# Patient Record
Sex: Male | Born: 1950
Health system: Southern US, Community
[De-identification: ages and names within clinical notes are randomized; demographics above are authoritative.]

## PROBLEM LIST (undated history)

## (undated) DIAGNOSIS — H47019 Ischemic optic neuropathy, unspecified eye: Secondary | ICD-10-CM

## (undated) DIAGNOSIS — I422 Other hypertrophic cardiomyopathy: Secondary | ICD-10-CM

## (undated) DIAGNOSIS — I472 Ventricular tachycardia: Secondary | ICD-10-CM

## (undated) DIAGNOSIS — F32A Depression, unspecified: Secondary | ICD-10-CM

## (undated) DIAGNOSIS — G250 Essential tremor: Secondary | ICD-10-CM

## (undated) DIAGNOSIS — Z9852 Vasectomy status: Secondary | ICD-10-CM

## (undated) DIAGNOSIS — I471 Supraventricular tachycardia, unspecified: Secondary | ICD-10-CM

## (undated) DIAGNOSIS — K219 Gastro-esophageal reflux disease without esophagitis: Secondary | ICD-10-CM

## (undated) DIAGNOSIS — H8110 Benign paroxysmal vertigo, unspecified ear: Secondary | ICD-10-CM

## (undated) DIAGNOSIS — E669 Obesity, unspecified: Secondary | ICD-10-CM

## (undated) DIAGNOSIS — Z9049 Acquired absence of other specified parts of digestive tract: Secondary | ICD-10-CM

## (undated) DIAGNOSIS — I639 Cerebral infarction, unspecified: Secondary | ICD-10-CM

## (undated) DIAGNOSIS — Z9089 Acquired absence of other organs: Secondary | ICD-10-CM

## (undated) DIAGNOSIS — I4729 Other ventricular tachycardia: Secondary | ICD-10-CM

## (undated) DIAGNOSIS — M1990 Unspecified osteoarthritis, unspecified site: Secondary | ICD-10-CM

## (undated) DIAGNOSIS — I10 Essential (primary) hypertension: Secondary | ICD-10-CM

## (undated) DIAGNOSIS — M109 Gout, unspecified: Secondary | ICD-10-CM

## (undated) DIAGNOSIS — C439 Malignant melanoma of skin, unspecified: Secondary | ICD-10-CM

## (undated) DIAGNOSIS — Z974 Presence of external hearing-aid: Secondary | ICD-10-CM

## (undated) DIAGNOSIS — F329 Major depressive disorder, single episode, unspecified: Secondary | ICD-10-CM

## (undated) DIAGNOSIS — Z973 Presence of spectacles and contact lenses: Secondary | ICD-10-CM

## (undated) DIAGNOSIS — Z9989 Dependence on other enabling machines and devices: Secondary | ICD-10-CM

## (undated) DIAGNOSIS — E785 Hyperlipidemia, unspecified: Secondary | ICD-10-CM

## (undated) DIAGNOSIS — I493 Ventricular premature depolarization: Secondary | ICD-10-CM

## (undated) DIAGNOSIS — G4733 Obstructive sleep apnea (adult) (pediatric): Secondary | ICD-10-CM

## (undated) HISTORY — DX: Benign paroxysmal vertigo, unspecified ear: H81.10

## (undated) HISTORY — DX: Essential tremor: G25.0

## (undated) HISTORY — DX: Ischemic optic neuropathy, unspecified eye: H47.019

## (undated) HISTORY — DX: Obesity, unspecified: E66.9

## (undated) HISTORY — DX: Unspecified osteoarthritis, unspecified site: M19.90

## (undated) HISTORY — DX: Gout, unspecified: M10.9

## (undated) HISTORY — DX: Obstructive sleep apnea (adult) (pediatric): G47.33

## (undated) HISTORY — DX: Gastro-esophageal reflux disease without esophagitis: K21.9

## (undated) HISTORY — PX: WISDOM TOOTH EXTRACTION: SHX21

## (undated) HISTORY — PX: OTHER SURGICAL HISTORY: SHX169

## (undated) HISTORY — DX: Dependence on other enabling machines and devices: Z99.89

## (undated) HISTORY — PX: NASAL SEPTUM SURGERY: SHX37

## (undated) HISTORY — PX: CATARACT EXTRACTION: SUR2

## (undated) HISTORY — DX: Hyperlipidemia, unspecified: E78.5

## (undated) HISTORY — PX: COLONOSCOPY: SHX174

## (undated) HISTORY — PX: APPENDECTOMY: SHX54

## (undated) HISTORY — PX: TONSILLECTOMY: SUR1361

---
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
## 1998-12-14 ENCOUNTER — Ambulatory Visit (HOSPITAL_BASED_OUTPATIENT_CLINIC_OR_DEPARTMENT_OTHER): Admission: RE | Admit: 1998-12-14 | Discharge: 1998-12-14 | Payer: Self-pay | Admitting: General Surgery

## 1999-03-19 ENCOUNTER — Encounter: Payer: Self-pay | Admitting: Emergency Medicine

## 1999-03-19 ENCOUNTER — Emergency Department (HOSPITAL_COMMUNITY): Admission: EM | Admit: 1999-03-19 | Discharge: 1999-03-19 | Payer: Self-pay | Admitting: Emergency Medicine

## 1999-12-23 ENCOUNTER — Encounter: Admission: RE | Admit: 1999-12-23 | Discharge: 2000-03-22 | Payer: Self-pay | Admitting: Internal Medicine

## 2000-06-26 ENCOUNTER — Encounter: Payer: Self-pay | Admitting: Emergency Medicine

## 2000-06-26 ENCOUNTER — Observation Stay (HOSPITAL_COMMUNITY): Admission: EM | Admit: 2000-06-26 | Discharge: 2000-06-27 | Payer: Self-pay | Admitting: Emergency Medicine

## 2000-09-01 ENCOUNTER — Encounter: Admission: RE | Admit: 2000-09-01 | Discharge: 2000-11-30 | Payer: Self-pay | Admitting: Internal Medicine

## 2006-08-28 ENCOUNTER — Emergency Department (HOSPITAL_COMMUNITY): Admission: EM | Admit: 2006-08-28 | Discharge: 2006-08-28 | Payer: Self-pay | Admitting: Emergency Medicine

## 2006-12-18 ENCOUNTER — Encounter: Admission: RE | Admit: 2006-12-18 | Discharge: 2006-12-18 | Payer: Self-pay | Admitting: Orthopedic Surgery

## 2008-08-07 ENCOUNTER — Ambulatory Visit: Payer: Self-pay | Admitting: Internal Medicine

## 2008-08-07 DIAGNOSIS — E1169 Type 2 diabetes mellitus with other specified complication: Secondary | ICD-10-CM | POA: Insufficient documentation

## 2008-08-07 DIAGNOSIS — K219 Gastro-esophageal reflux disease without esophagitis: Secondary | ICD-10-CM | POA: Insufficient documentation

## 2008-08-07 DIAGNOSIS — E669 Obesity, unspecified: Secondary | ICD-10-CM | POA: Insufficient documentation

## 2008-09-06 ENCOUNTER — Encounter: Payer: Self-pay | Admitting: Internal Medicine

## 2008-09-06 ENCOUNTER — Ambulatory Visit: Payer: Self-pay | Admitting: Internal Medicine

## 2008-09-06 LAB — CONVERTED CEMR LAB: UREASE: NEGATIVE

## 2008-09-07 ENCOUNTER — Encounter: Payer: Self-pay | Admitting: Internal Medicine

## 2008-09-13 ENCOUNTER — Encounter: Admission: RE | Admit: 2008-09-13 | Discharge: 2008-09-13 | Payer: Self-pay | Admitting: Occupational Medicine

## 2009-09-20 DIAGNOSIS — M1990 Unspecified osteoarthritis, unspecified site: Secondary | ICD-10-CM | POA: Insufficient documentation

## 2009-09-20 DIAGNOSIS — I493 Ventricular premature depolarization: Secondary | ICD-10-CM | POA: Insufficient documentation

## 2009-09-20 DIAGNOSIS — M109 Gout, unspecified: Secondary | ICD-10-CM | POA: Insufficient documentation

## 2009-09-20 DIAGNOSIS — R809 Proteinuria, unspecified: Secondary | ICD-10-CM | POA: Insufficient documentation

## 2009-11-29 DIAGNOSIS — E1121 Type 2 diabetes mellitus with diabetic nephropathy: Secondary | ICD-10-CM | POA: Insufficient documentation

## 2010-03-07 ENCOUNTER — Emergency Department (HOSPITAL_COMMUNITY): Admission: EM | Admit: 2010-03-07 | Discharge: 2010-03-07 | Payer: Self-pay | Admitting: Emergency Medicine

## 2010-12-19 ENCOUNTER — Ambulatory Visit (HOSPITAL_BASED_OUTPATIENT_CLINIC_OR_DEPARTMENT_OTHER)
Admission: RE | Admit: 2010-12-19 | Discharge: 2010-12-19 | Payer: Self-pay | Source: Home / Self Care | Attending: Otolaryngology | Admitting: Otolaryngology

## 2010-12-28 DIAGNOSIS — G4733 Obstructive sleep apnea (adult) (pediatric): Secondary | ICD-10-CM

## 2011-01-23 DIAGNOSIS — J309 Allergic rhinitis, unspecified: Secondary | ICD-10-CM | POA: Insufficient documentation

## 2011-01-30 ENCOUNTER — Encounter: Payer: Self-pay | Admitting: Internal Medicine

## 2011-02-13 ENCOUNTER — Other Ambulatory Visit: Payer: Self-pay | Admitting: Orthopedic Surgery

## 2011-02-13 DIAGNOSIS — R609 Edema, unspecified: Secondary | ICD-10-CM

## 2011-02-13 DIAGNOSIS — M25561 Pain in right knee: Secondary | ICD-10-CM

## 2011-02-19 ENCOUNTER — Ambulatory Visit
Admission: RE | Admit: 2011-02-19 | Discharge: 2011-02-19 | Disposition: A | Discharge status: Home / Self Care | Payer: BC Managed Care – PPO | Source: Ambulatory Visit | Attending: Orthopedic Surgery | Admitting: Orthopedic Surgery

## 2011-02-19 DIAGNOSIS — M25561 Pain in right knee: Secondary | ICD-10-CM

## 2011-02-19 DIAGNOSIS — R609 Edema, unspecified: Secondary | ICD-10-CM

## 2011-04-08 ENCOUNTER — Other Ambulatory Visit (HOSPITAL_COMMUNITY): Payer: BC Managed Care – PPO

## 2011-04-08 ENCOUNTER — Encounter (HOSPITAL_COMMUNITY): Payer: Self-pay | Admitting: Radiology

## 2011-04-08 ENCOUNTER — Inpatient Hospital Stay (HOSPITAL_COMMUNITY)
Admission: EM | Admit: 2011-04-08 | Discharge: 2011-04-11 | DRG: 014 | Disposition: A | Discharge status: Home / Self Care | Payer: BC Managed Care – PPO | Attending: Internal Medicine | Admitting: Internal Medicine

## 2011-04-08 ENCOUNTER — Emergency Department (HOSPITAL_COMMUNITY): Payer: BC Managed Care – PPO

## 2011-04-08 DIAGNOSIS — E119 Type 2 diabetes mellitus without complications: Secondary | ICD-10-CM | POA: Diagnosis present

## 2011-04-08 DIAGNOSIS — G4733 Obstructive sleep apnea (adult) (pediatric): Secondary | ICD-10-CM | POA: Diagnosis present

## 2011-04-08 DIAGNOSIS — R42 Dizziness and giddiness: Secondary | ICD-10-CM

## 2011-04-08 DIAGNOSIS — Z794 Long term (current) use of insulin: Secondary | ICD-10-CM

## 2011-04-08 DIAGNOSIS — I10 Essential (primary) hypertension: Secondary | ICD-10-CM | POA: Diagnosis present

## 2011-04-08 DIAGNOSIS — Z7902 Long term (current) use of antithrombotics/antiplatelets: Secondary | ICD-10-CM

## 2011-04-08 DIAGNOSIS — M1990 Unspecified osteoarthritis, unspecified site: Secondary | ICD-10-CM | POA: Diagnosis present

## 2011-04-08 DIAGNOSIS — K449 Diaphragmatic hernia without obstruction or gangrene: Secondary | ICD-10-CM | POA: Diagnosis present

## 2011-04-08 DIAGNOSIS — I6350 Cerebral infarction due to unspecified occlusion or stenosis of unspecified cerebral artery: Principal | ICD-10-CM | POA: Diagnosis present

## 2011-04-08 DIAGNOSIS — M109 Gout, unspecified: Secondary | ICD-10-CM | POA: Diagnosis present

## 2011-04-08 DIAGNOSIS — R809 Proteinuria, unspecified: Secondary | ICD-10-CM | POA: Diagnosis present

## 2011-04-08 DIAGNOSIS — E785 Hyperlipidemia, unspecified: Secondary | ICD-10-CM | POA: Diagnosis present

## 2011-04-08 DIAGNOSIS — J309 Allergic rhinitis, unspecified: Secondary | ICD-10-CM | POA: Diagnosis present

## 2011-04-08 DIAGNOSIS — K219 Gastro-esophageal reflux disease without esophagitis: Secondary | ICD-10-CM | POA: Diagnosis present

## 2011-04-08 HISTORY — DX: Essential (primary) hypertension: I10

## 2011-04-08 HISTORY — DX: Acquired absence of other organs: Z90.89

## 2011-04-08 HISTORY — DX: Acquired absence of other specified parts of digestive tract: Z90.49

## 2011-04-08 HISTORY — DX: Cerebral infarction, unspecified: I63.9

## 2011-04-08 HISTORY — DX: Malignant melanoma of skin, unspecified: C43.9

## 2011-04-08 HISTORY — DX: Vasectomy status: Z98.52

## 2011-04-08 LAB — DIFFERENTIAL
Eosinophils Relative: 4 % (ref 0–5)
Lymphocytes Relative: 28 % (ref 12–46)
Lymphs Abs: 2 10*3/uL (ref 0.7–4.0)
Monocytes Absolute: 0.6 10*3/uL (ref 0.1–1.0)

## 2011-04-08 LAB — URINALYSIS, ROUTINE W REFLEX MICROSCOPIC
Glucose, UA: 250 mg/dL — AB
Hgb urine dipstick: NEGATIVE
Specific Gravity, Urine: 1.013 (ref 1.005–1.030)
Urobilinogen, UA: 0.2 mg/dL (ref 0.0–1.0)

## 2011-04-08 LAB — CBC
HCT: 38.3 % — ABNORMAL LOW (ref 39.0–52.0)
MCHC: 34.7 g/dL (ref 30.0–36.0)
MCV: 85.7 fL (ref 78.0–100.0)
RDW: 13 % (ref 11.5–15.5)

## 2011-04-08 LAB — URINE MICROSCOPIC-ADD ON

## 2011-04-08 LAB — POCT I-STAT, CHEM 8
Chloride: 104 mEq/L (ref 96–112)
Creatinine, Ser: 1 mg/dL (ref 0.4–1.5)
Glucose, Bld: 203 mg/dL — ABNORMAL HIGH (ref 70–99)
Hemoglobin: 13.3 g/dL (ref 13.0–17.0)
Potassium: 3.7 mEq/L (ref 3.5–5.1)

## 2011-04-08 LAB — GLUCOSE, CAPILLARY: Glucose-Capillary: 147 mg/dL — ABNORMAL HIGH (ref 70–99)

## 2011-04-08 MED ORDER — GADOBENATE DIMEGLUMINE 529 MG/ML IV SOLN: 20.0000 mL | Freq: Once | INTRAVENOUS | Status: DC

## 2011-04-09 ENCOUNTER — Encounter (HOSPITAL_COMMUNITY): Payer: Self-pay | Admitting: Radiology

## 2011-04-09 ENCOUNTER — Inpatient Hospital Stay (HOSPITAL_COMMUNITY): Payer: BC Managed Care – PPO

## 2011-04-09 LAB — COMPREHENSIVE METABOLIC PANEL
ALT: 18 U/L (ref 0–53)
AST: 17 U/L (ref 0–37)
Albumin: 3.2 g/dL — ABNORMAL LOW (ref 3.5–5.2)
Alkaline Phosphatase: 103 U/L (ref 39–117)
BUN: 11 mg/dL (ref 6–23)
Chloride: 100 mEq/L (ref 96–112)
Potassium: 3.8 mEq/L (ref 3.5–5.1)
Sodium: 137 mEq/L (ref 135–145)
Total Bilirubin: 0.3 mg/dL (ref 0.3–1.2)

## 2011-04-09 LAB — CBC
MCV: 85.7 fL (ref 78.0–100.0)
Platelets: 213 10*3/uL (ref 150–400)
RBC: 4.74 MIL/uL (ref 4.22–5.81)
WBC: 8.8 10*3/uL (ref 4.0–10.5)

## 2011-04-09 LAB — GLUCOSE, CAPILLARY
Glucose-Capillary: 106 mg/dL — ABNORMAL HIGH (ref 70–99)
Glucose-Capillary: 109 mg/dL — ABNORMAL HIGH (ref 70–99)
Glucose-Capillary: 166 mg/dL — ABNORMAL HIGH (ref 70–99)

## 2011-04-09 LAB — LIPID PANEL
Cholesterol: 176 mg/dL (ref 0–200)
VLDL: 75 mg/dL — ABNORMAL HIGH (ref 0–40)

## 2011-04-09 LAB — TSH: TSH: 3.576 u[IU]/mL (ref 0.350–4.500)

## 2011-04-09 LAB — URIC ACID: Uric Acid, Serum: 6.9 mg/dL (ref 4.0–7.8)

## 2011-04-09 MED ORDER — IOHEXOL 350 MG/ML SOLN
75.0000 mL | Freq: Once | INTRAVENOUS | Status: AC | PRN
  Administered 2011-04-09: 75 mL via INTRAVENOUS

## 2011-04-10 LAB — GLUCOSE, CAPILLARY
Glucose-Capillary: 171 mg/dL — ABNORMAL HIGH (ref 70–99)
Glucose-Capillary: 212 mg/dL — ABNORMAL HIGH (ref 70–99)
Glucose-Capillary: 194 mg/dL — ABNORMAL HIGH (ref 70–99)
Glucose-Capillary: 161 mg/dL — ABNORMAL HIGH (ref 70–99)

## 2011-04-11 LAB — GLUCOSE, CAPILLARY: Glucose-Capillary: 93 mg/dL (ref 70–99)

## 2011-04-11 NOTE — H&P (Signed)
[place]. [facility]  Patient:    Joshua [name], Joshua [name]                      MRN: [id number] Adm. Date:  [id number] Attending:  Hoyle Sauer CC:         Jonelle Sports. Cheryll Cockayne, M.D.   History and Physical  CHIEF COMPLAINT:  Inhaled chlorine gas resulting in shortness of breath.  HISTORY OF PRESENT ILLNESS:  This is a 60 year old Caucasian male followed by Dr. 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  This resulted in significant respiratory distress and a dry cough along with mild pleuritic chest pain.  The patient denied any nausea, vomiting, blurred vision, headache, rhinitis, diarrhea, constipation, or new neurological deficits.  The patient presented to [facility] Emergency Room where his pO2 was 69 on room air and he is now admitted for observation secondary to this toxicity exposure.  REVIEW OF SYSTEMS:  As above.  PAST MEDICAL HISTORY:  Type 2 diabetes mellitus with the last hemoglobin A1C being approximately 6.9% per the patient.  Gout.  Osteoarthritis.  SOCIAL HISTORY:  The patient is married, has two kids.  He is a high Engineer, site locally and teaches history.  He is active and swims laps in a pool and walks on a regular basis.  Denies any tobacco abuse and has occasional ethanol use.  FAMILY HISTORY:  Negative for pulmonary disease, cancer or early heart disease.  MEDICATIONS:  Include Glucophage twice daily, Naprosyn and Allopurinol.  PHYSICAL EXAMINATION:  GENERAL:  Morbidly obese but pleasant, cooperative and communicative white male in no apparent distress with oxygen.  VITAL SIGNS:  Blood pressure is 136/87, pulse 87, respiratory rate 24, temperature 99.3 degrees Fahrenheit. Oxygen saturation is 93%  on room air.  HEENT:  Head exam is normocephalic and atraumatic.  Eye exam:  Anicteric. Extraocular movements are intact.  ENT exam:  There is no sinus tenderness. No oropharyngeal lesions.  SKIN: Warm and dry.  NECK:  Supple.  No thyromegaly.  No cervical lymphadenopathy.  LUNGS:  Clear to auscultation bilaterally.  CARDIOVASCULAR:  Regular rate and rhythm without murmurs, rubs, or gallops.  ABDOMEN:  Reveals soft but obese, nontender, nondistended abdomen with bowel sounds present throughout.  EXTREMITIES:  Reveals no peripheral edema with pulses intact in all four extremities.  There is no cyanosis.  NEUROLOGIC:  Grossly nonfocal.  LABORATORY DATA:  Reveals an ABG with pH of 7.405, pCO2 45, pO2 67, 93% oxygen saturation on room air.  Chest x-ray reveals no apparent distress with no cardiomegaly or pulmonary edema.  ASSESSMENT AND PLAN:  We have a 60 year old, Caucasian male with type 2 diabetes mellitus and gout and morbid obesity who presents with toxicity secondary to inhaled chlorine gas.  1. Chlorine gas toxicity.  Will observe the patient overnight for progression    of his symptoms and support with oxygen and measure his oxygen saturation    on a regular basis.  Will obtain a normal ABG to confirm improvement. 2. Chest pain.  Will check an EKG but I highly suspect the chest discomfort    is secondary to chlorine gas exposure which is somewhat pleuritic in    nature.  The patient is normally active, swimming laps just  yesterday.    The patient denies any nausea or vomiting, diaphoresis or shortness of    breath at the current time. 3. Type 2 diabetes mellitus.  Will continue Glucophage; however, given the    fact that we do not know his current dosage, will start Glucophage 500 mg    twice daily and continue a no concentrated sweet diet and watch his CBGs    q.a.c. and h.s. 4. Gout. Asymptomatic currently with benign extremity exam.  Will continue    Allopurinol at  100 mg each day. DD:  06/26/00 TD:  06/27/00 Job: [id number]

## 2011-04-17 DIAGNOSIS — R42 Dizziness and giddiness: Secondary | ICD-10-CM | POA: Insufficient documentation

## 2011-04-17 NOTE — Consult Note (Signed)
NAMEMarland Kitchen  Joshua [name], Joshua [name] NO.:  [id number]  MEDICAL RECORD NO.:  [id number]           PATIENT TYPE:  I  LOCATION:  3029                         FACILITY:  [facility]  PHYSICIAN:  Thana Farr, MD    DATE OF BIRTH:  30-Nov-1950  DATE OF CONSULTATION:  04/08/2011 DATE OF DISCHARGE:                                CONSULTATION   REQUESTING PHYSICIAN:  Dr. Patrica Duel.  HISTORY:  Joshua [name] is a 60 year old gentleman that reports that he awakened this morning at approximately 6 a.m.  After going to the bathroom had acute onset of dizziness.  He describes the dizziness as a vertigo.  Reports that he was able to return to his bedside and when he attempted to stand had acute weakness and fell to the floor.  Was unable to stand back up.  The patient called his sons at that time and EMS was called.  The patient did have some nausea associated with his vertigo and had one episode of vomiting.  The patient describes that with looking left to right up or down he has no symptoms.  With just moving his head, the patient has no symptoms either.  Seems to only have symptoms if he is moving his whole body.  PAST MEDICAL HISTORY: 1. Hypertension. 2. Diabetes. 3. 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 4. Melanoma.  MEDICATIONS AT HOME:  Aspirin, Celebrex, Diovan, fenofibrate, Glucovance, Lovaza, Lutein, multivitamin, Percocet, trazodone, vitamin B12, and Vytorin.  SOCIAL HISTORY:  The patient has no history of illicit drug abuse or smoking.  He does drink on occasion.  PHYSICAL EXAMINATION:  VITAL SIGNS:  Blood pressure 153/82, heart rate 78, respiratory rate 20, temperature 97.6. MENTAL STATUS TESTING:  The patient is alert and oriented.  He can follow commands without difficulty.  Speech is fluent.  On cranial nerve testing II disk flat bilaterally.  Visual fields grossly intact.  III, IV and VI extraocular movements intact.  V and VII smile symmetric. VIII grossly intact.   IX and VII positive gag.  XI bilateral shoulder shrug.  XII midline tongue extension.  On motor exam, the patient is 5/5 throughout.  There is normal tone and bulk.  Sensory, pinprick and light touch are intact bilaterally.  Deep tendon reflexes are 2+ in the left upper extremity, 1+ in the right upper extremity, and absent in the lower extremities.  Plantars are upgoing bilaterally.  On cerebellar testing, finger-to-nose and heel-to-shin intact.  LABORATORY DATA:  White blood cell count 7.1, platelet count 193, hemoglobin/hematocrit 13.3 and 38.3 respectively.  Glucose 203, sodium 140, potassium 3.7, chloride 104, bicarb 25, BUN and creatinine 13 and 1.0 respectively.  Troponin less than 0.05.  CT shows old basal ganglia infarcts bilaterally and an area of low density in the right temporal lobe.  Etiology is questionable and does include artifact.  ASSESSMENT:  Joshua [name] is a 60 year old male with acute onset of vertigo.  Remainder of neurological exam is unremarkable.  The patient has no nystagmus.  There is a questionable abnormality on CT.PLAN: 1. MRI of the brain with and without contrast.  If no acute abnormalities  are noted, would not proceed with stroke workup. Vertigo at that point may be related to diabetes versus an ENT etiology. Otherwise, stroke workup to be initiated.  We will follow up after MRI performed.          ______________________________ Thana Farr, MD     LR/MEDQ  D:  04/08/2011  T:  04/08/2011  Job:  [id number]  Electronically Signed by Thana Farr MD on 04/17/2011 05:10:47 PM

## 2011-04-22 ENCOUNTER — Ambulatory Visit: Payer: BC Managed Care – PPO | Attending: Internal Medicine | Admitting: Physical Therapy

## 2011-04-22 DIAGNOSIS — IMO0001 Reserved for inherently not codable concepts without codable children: Secondary | ICD-10-CM | POA: Insufficient documentation

## 2011-04-22 DIAGNOSIS — I69998 Other sequelae following unspecified cerebrovascular disease: Secondary | ICD-10-CM | POA: Insufficient documentation

## 2011-04-22 DIAGNOSIS — R269 Unspecified abnormalities of gait and mobility: Secondary | ICD-10-CM | POA: Insufficient documentation

## 2011-04-22 DIAGNOSIS — R42 Dizziness and giddiness: Secondary | ICD-10-CM | POA: Insufficient documentation

## 2011-04-23 NOTE — Discharge Summary (Signed)
NAMEMarland Kitchen  Joshua [name], Joshua [name] NO.:  [id number]  MEDICAL RECORD NO.:  [id number]           PATIENT TYPE:  I  LOCATION:  3029                         FACILITY:  [facility]  PHYSICIAN:  Gwen Pounds, MD       DATE OF BIRTH:  14-May-1951  DATE OF ADMISSION:  04/08/2011 DATE OF DISCHARGE:  04/11/2011                              DISCHARGE SUMMARY   PRIMARY CARE PROVIDER:  Myself.  NEUROLOGIST:  Pramod P. Pearlean Brownie, MD  CARDIOLOGIST:  Lyn Records, MD  OPHTHALMOLOGIST:  Delon Sacramento, MD  DISCHARGE DIAGNOSES: 1. Resolving intractable vertigo. 2. Silent acute small parietal cerebrovascular accident. 3. Hyperlipidemia. 4. Diabetes mellitus type 2. 5. Hypertension. 6. Gout. 7. History of cataracts. 8. History of elevated sed rate with negative rheumatologic workup. 9. History of spontaneous central retinal artery occlusion in April     2011. 10.Obstructive sleep apnea on CPAP. 11.History of negative stress test in 2010 with ejection fraction 50%. 12.Right knee arthroscopy April 2012, discharged on Percocet and     Celebrex which I discontinued the Celebrex. 13.Allergic rhinitis 14.Microalbuminuria. 15.Morbid obesity. 16.Osteoarthritis. 17.Pre premature ventricular contractions. 18.History of elevated creatinine up to 1.8 with the last creatinines     in the hospital at 0.92. 19.Gastroesophageal reflux disease, hiatal hernia. 20.History of precancerous skin lesion removal. 21.History of 5 knee operations. 22.History of appendectomy. 23.Tonsil and adenoidectomy. 24.History of sinus surgery.  DISCHARGE MEDICATION: 1. Allopurinol 300 mg p.o. at bedtime. 2. Plavix 75 daily. 3. Zetia 10 mg p.o. daily. 4. Meclizine 25 mg 3 times a day as needed for dizzy. 5. Crestor 20 mg p.o. daily. 6. Zetia 10 mg p.o. daily. 7. Crestor and Zetia is to replace the Vytorin. 8. Diovan HCT 160/12.5 daily. 9. Fenofibrate 54 mg p.o. daily. 10.Fish oil 1 gram 2 capsules by mouth  daily. 11.Glucovance 2.5/500, 2 tablets by mouth twice daily. 12.Lantus 60 units b.i.d. 13.Lutein 1 tablet every morning. 14.Multivitamin 2 p.o. daily. 15.Percocet 1-2 tablets by mouth 3 times a day as needed for pain. 16.Trazodone 100 mg p.o. at bedtime. 17.Vitamin B12 5000 mcg 1 tablet by mouth daily. 18.He is to stop taking aspirin and the Celebrex at this current time.  AFTERCARE FOLLOWUP INSTRUCTIONS:  He is to follow up with me in about 2 weeks.  He is to lose 20-30 pounds over the summer and he is to slowly resume baseline activities.  He is to do outpatient physical therapy and vestibular rehab.  DISCHARGE PROCEDURES: 1. A 2-D echocardiogram which is currently pending.  CT angio of the     neck and the head revealing no significant findings in the neck,     mild atherosclerotic changes of both carotid bifurcations but no     measurable stenosis compared to the Morris distal and cervical ICA     diameter and negative CT angiogram of the large or medium size     intracranial vessels.  No evidence of occlusion, stenosis,     aneurysm, or vascular malformation.  This is after a carotid     Doppler which revealed about 60% left ICA stenosis. 2. MRI  of the brain reveals small focus of acute infarct of the right     parasagittal front white matter corpus callosum at the right     parietal lobe.  Atrophy and small-vessel disease.  No hemorrhage or     midline shift.  Widely patent carotid and basilar arteries.  Small     radiopaque foreign bodies embedded in the patient's subcutaneous     soft tissues. 3. Cranial CT revealed possible edema anteriorly and medial in the     right temporal lobe versus artifact, inflammatory versus subacute     infarction, chronic lacunar infarcts.  HISTORY OF PRESENT ILLNESS:  Briefly, Joshua [name] is a 60 year old male well-known to me who woke up at 6:00 a.m. on the day of admission, was feeling fine, went to the bathroom, came back, lied down  on bed, immediately had significant dizzy, nausea, headache, fall, presyncope type event with diaphoresis.  It did not improve.  He called EMS, was brought to the emergency room where clinical eval was compatible with vertigo but fear of stroke was entertained and cranial CT was done which revealed edema and felt necessary to proceed to the MRI which revealed a subacute stroke.  HOSPITAL COURSE:  Joshua [name] is a 60 year old man with type 2 diabetes, morbid obesity, hypertension, hyperlipidemia, obstructive sleep apnea who presented with an acute cerebrovascular accident and vertigo type symptoms.  At first it was difficult to determine whether this stroke led to the vertiginous symptoms or whether he had 2 primary problems, i.e., vertigo with an incidental finding of a subacute stroke.  He was admitted, he was placed on telemetry.  EKG and telemetry ruled out arrhythmia.  He was continued on his home medications with some adjustments.  He was placed on insulin with insulin sliding scales.  A1c came back at 7.6.  He will need tighter control.  He will also need some weight loss.  This can be pursued further as an outpatient.  Neurology was consulted and helped.  He was switched from aspirin to Plavix.  He was kept on a CPAP.  Blood pressure medications were adjusted and we went too far and had to readjust back.  He was placed on Lovenox for DVT prophylaxis.  He was given Zofran, morphine, Ativan, and treatment for his underlying symptoms.  He was given some IV fluids and oxygen initially and these were discontinued eventually.  Again his stroke was considered silent and we will continue further risk factor modification.  His Vytorin was switched over to Crestor and Zetia as his triglycerides still remained at 377, although his LDL is fine and he will stay on the fenofibrate as well in order to avoid potential rhabo and drug interactions and will stay on the Crestor instead of  the Vytorin.  His vertigo is improving.  He is getting physical therapy and vestibular rehab and he has remained on meclizine and further adjustments could be made as an outpatient.  Diabetes mellitus.  His blood sugars have remained 90 to about 190 at the hospital stay.  Glucovance was held throughout the hospital stay and he did get some IV contrast and this was a good thing.  His creatinine has been fine, so he will be discharged back on his stable home regimen and hopefully will be able to wean down his medications as his A1c goes down and as his weight goes down.  His blood pressure on date of discharge is 113/68.  For his obstructive sleep apnea,  he will continue on his CPAP at night.  Joshua [name] was seen and evaluated on Apr 11, 2011.  He states he is feeling better.  He is little bit symptomatic but he is stable enough to go home.  All his vital signs are stable.  His blood sugars are fine. His physical exam is unremarkable except for the nystagmus.  His last labs showed A1c of 7.6, TSH of 3.56, uric acid 6.9, total cholesterol 176, triglyceride 377, HDL 33, LDL 68.  Echo is currently pending as stated above.  His last CMET shows a BUN of 11, creatinine 0.92.  Liver tests were fine except for albumin of 3.2.  CBC was completely normal. Plan for today is to go home, follow up with me, and get outpatient physical therapy and vestibular rehab and follow up with Dr. Pearlean Brownie as needed.     Gwen Pounds, MD     JMR/MEDQ  D:  04/11/2011  T:  04/11/2011  Job:  [id number]  cc:   Pramod P. Pearlean Brownie, MD Lyn Records, M.D. Delon Sacramento, M.D.  Electronically Signed by Creola Corn MD on 04/23/2011 10:26:51 PM

## 2011-04-23 NOTE — H&P (Signed)
NAME:  Joshua [name], Joshua [name] NO.:  [id number]  MEDICAL RECORD NO.:  [id number]           PATIENT TYPE:  I  LOCATION:  3029                         FACILITY:  [facility]  PHYSICIAN:  Gwen Pounds, MD       DATE OF BIRTH:  [birth year]  DATE OF ADMISSION:  04/08/2011 DATE OF DISCHARGE:                             HISTORY & PHYSICAL   PRIMARY CARE PROVIDER:  Gwen Pounds, MD  CARDIOLOGIST:  Lyn Records, MD  OPHTHALMOLOGIST:  Delon Sacramento, MD  RHEUMATOLOGIST:  Azzie Roup, MD  NEUROLOGIST:  Dr. Thad Ranger.  CHIEF COMPLAINT:  Dizzy and nausea.  HISTORY OF PRESENT ILLNESS:  This is a 60 year old male who woke up at 6 a.m. and reports that he was fine.  He went to the bathroom, came back to the bed, lied down, and immediately had dizzy with nausea, headache, ended up falling to the ground with kind of this presyncopal type event with significant diaphoresis.  When he could not get up, EMS was called and he was brought to the emergency department.  Workup revealed cranial CT showing swelling and the differential diagnosis at that point was either severe intractable vertigo versus stroke.  MRI showed the stroke and I was called for inpatient admission.  On arrival, he was still pretty symptomatic from dizziness and has a headache.  He is lying in a dark room.  He is happy to see me, unhappy to hear that he has had a stroke.  PAST MEDICAL HISTORY: 1. Hyperlipidemia. 2. Diabetes mellitus type 2. 3. Hypertension. 4. Gout. 5. History of cataracts. 6. History of elevated sed rate with negative rheumatologic workup. 7. History of a spontaneous central retinal artery occlusion on March 08, 2010. 8. Obstructive sleep apnea on CPAP. 9. Stress test, last stress test was in 2010 and was negative, EF at     that time was 50%.  He has had a right knee arthroscopy in April     2012 per Dr. Priscille Kluver for which he was discharged on Percocet and     Celebrex which he is  continuing to take. 10.Allergic rhinitis. 11.Microalbuminuria. 12.Obesity. 13.Osteoarthritis. 14.Premature ventricular contractions. 15.History of elevated creatinine up to 1.8, last creatinine was     lower. 16.GERD/hiatal hernia. 17.History of precancerous skin lesion removal. 18.History of five knee operations. 19.History of appendectomy. 20.Tonsil and adenoidectomy. 21.History of sinus surgery.  Complete Medication List: 1)  Allopurinol 300 Mg Tabs (Allopurinol) .... Take one tablet daily 2)  Glucovance 2.5-500 Mg Tabs (Glyburide-metformin) .... Take 2 tabs po bid 3)  Multivitamins Tabs (Multiple vitamin) .... Take one tablet by mouth every day 4)  Fenofibrate 54 Mg Tabs (Fenofibrate) .Marland Kitchen.. 1 po qd 5)  Vytorin 10-80 Mg Tabs (Ezetimibe-simvastatin) .... Take one tablet daily 6)  Aspirin 325 Mg Tab (Aspirin) .... Take one (1) tablet by mouth daily 7)  Omega 3 Cpdr (Omega-3 fatty acids cpdr) .... Take one tablet by mouth twice daily 8)  Diovan Hct 160-12.5 Mg Tabs (Valsartan-hydrochlorothiazide) .Marland Kitchen.. 1 po bid 9)  Lantus Solostar 100 Unit/ml Soln (Insulin glargine) .... Inject  60u bid daily  as directed 10)  Aleve 220 Mg Caps (Naproxen sodium) .Marland Kitchen.. 1 po bid 11) Celebrex  Allergies NKDA  FAMILY HISTORY:  Father died at the age of 41 of Alzheimer disease. Mother had multiple medical problems, cerebral hemorrhage, AAA, macular degeneration, and thyroid issues.  One brother is smoker and recovered from alcoholism and has no coronary disease or cancers noted.  SOCIAL HISTORY:  He is married, has two boys, four grandchildren.  He is a Runner, broadcasting/film/video at [employer], former [employer] manager.  Denies smoking and only has occasional alcohol.  REVIEW OF SYSTEMS:  Full review of systems obtained and please see HPI. He does not have any chest pain or shortness of breath.  He is able to speak.  He has got no slurring.  He has got no weakness, no focal neurologic deficits.  He is has just  headache and dizzy.  As per HPI, all other organ systems reviewed and negative.  PHYSICAL EXAMINATION:  VITAL SIGNS:  Temperature 97.6, blood pressure 153/82, heart rate 78, respiratory rate 20. GENERAL:  He is alert and oriented.  He looks poor with a headache, unable to keep his eyes closed. PULMONARY:  Clear to auscultation bilaterally. CARDIAC:  Regular. ABDOMEN:  Soft, obese. NECK:  No JVD.  No bruit.  He is moving all fours. NEUROLOGIC:  Otherwise normal.  Speech is grossly normal.  ANCILLARY DATA:  EKG shows normal sinus rhythm, left axis deviation, left anterior fascicular block, and question LVH MRI shows acute CVA, right parasagittal frontal white matter changes near the corpus callosum, widely patent carotids, and basilar arteries are noted. Cranial CT shows edema versus artifact at the right temporal lobe, history of lacunes.  Urinalysis negative.  CK and troponin-I are negative.  White count 7.1, hemoglobin 13.3, platelet count 193.  Sodium 140, potassium 3.7, chloride 104, bicarb 25, BUN 13, creatinine 1, glucose 203.  ASSESSMENT:  This is a 60 year old man with diabetes mellitus type 2 with complications, morbid obesity, hypertension, hyperlipidemia, who presented with acute cerebrovascular accident leading to his dizziness and headache and failure to thrive.  He will be admitted for further evaluation and treatment.  PLAN: 1. Admit. 2. Telemetry monitoring to rule out arrhythmia, although unlikely     based on normal sinus rhythm on EKG. 3. Continue home medications.  Obviously, I am going to hold the     metformin sulfonylurea combination at this current time.  Manage     him only with insulin sliding scale and Lantus and watch for lows     and highs and titrate the medications accordingly.  We will check     an A1c.  His last A1c in my office was 6.9% and I was pleased with     the improvement as he over the last several months has dropped his     A1c from the  8.9-9.3 range down to 6.9, last checked on January 23, 2011. 4. For the history of central retinal artery occlusion, he has seen     Dr. Luciana Axe, Dr. Katrinka Blazing, Dr. Keane Police, Dr. Anne Hahn.  The feeling of a     shade over the eye was prior stroke.  He had MRI and MRA prior.  He     had carotids and 2-D echo, all as an outpatient after the April     2011 event.  Clearly, this is another stroke and we will follow up     with repeat 2-D echo  and carotids at this time, and obviously     Neurology is already on board. 5. Obstructive sleep apnea.  Dr. Lazarus Salines had him get the sleep study     that was done in January 2012, and after which he has been on CPAP.     He says it is 5 but I do not have his current settings.  He will     bring his own CPAP in and use it from home. 6. Current creatinine is 1.0 and we will follow creatinine throughout     the hospital stay. 7  Obesity.  He desperately needs significant amount of weight loss. 1. Hypertension.  His last blood pressure reading in my office was     140/80, this was also trending down with alterations of the blood     pressure medicines and hopefully blood pressure was going to be     better controlled on the CPAP.  He will continue needing risk     factor reduction potential and further titration of blood pressure     medicines as his blood pressure is a little bit high today, but I     am clearly not going to adjust it at this current time due to the     acute stroke. 2. For is allergic rhinitis, he was told to wear a mask in the yard     and try samples of Nasonex as needed. 3. He Will stop the nonsteroidal anti-inflammatories and Celebrex     immediately. 4. Lovenox for DVT prophylaxis. 5. Zofran, morphine, and Ativan have been ordered. 6. We will follow up on labs in the morning. 7. On reviewing the emergency room flow sheet, he has been given     Zofran, Antevert, and Reglan here in the emergency department as     well as some IV fluids and  oxygen. 8. My hope is that due to the small nature of the stroke and no gross     focal neuromuscular defects, only the dizziness and the headache, I     hope that he makes a very decent near full recovery. 9. He has also passed the swallowing screen and did very well with it.     We will allow him to eat.  I do not expect him to eat much,     therefore I have already backed down on the amount of insulin and     diabetic medications.     Gwen Pounds, MD     JMR/MEDQ  D:  04/08/2011  T:  04/09/2011  Job:  [id number]  cc:   Jonny Ruiz L. Rendall, M.D. Lyn Records, M.D. Delon Sacramento, M.D. Vincenza Hews MD Dareen Piano Dr. Thad Ranger.  Electronically Signed by Creola Corn MD on 04/23/2011 10:25:46 PM

## 2011-08-25 LAB — GLUCOSE, CAPILLARY: Glucose-Capillary: 175 — ABNORMAL HIGH

## 2011-10-08 ENCOUNTER — Other Ambulatory Visit: Payer: Self-pay | Admitting: Dermatology

## 2012-10-14 ENCOUNTER — Ambulatory Visit: Payer: BC Managed Care – PPO | Attending: Neurology | Admitting: *Deleted

## 2012-10-14 DIAGNOSIS — IMO0001 Reserved for inherently not codable concepts without codable children: Secondary | ICD-10-CM | POA: Insufficient documentation

## 2012-10-14 DIAGNOSIS — R42 Dizziness and giddiness: Secondary | ICD-10-CM | POA: Insufficient documentation

## 2012-10-20 ENCOUNTER — Encounter: Payer: BC Managed Care – PPO | Admitting: *Deleted

## 2012-10-28 ENCOUNTER — Encounter: Payer: BC Managed Care – PPO | Admitting: *Deleted

## 2012-11-03 ENCOUNTER — Ambulatory Visit: Payer: BC Managed Care – PPO | Attending: Neurology | Admitting: *Deleted

## 2012-11-03 DIAGNOSIS — R42 Dizziness and giddiness: Secondary | ICD-10-CM | POA: Insufficient documentation

## 2012-11-03 DIAGNOSIS — IMO0001 Reserved for inherently not codable concepts without codable children: Secondary | ICD-10-CM | POA: Insufficient documentation

## 2012-11-09 ENCOUNTER — Ambulatory Visit: Payer: BC Managed Care – PPO | Admitting: *Deleted

## 2013-03-04 DIAGNOSIS — F410 Panic disorder [episodic paroxysmal anxiety] without agoraphobia: Secondary | ICD-10-CM | POA: Insufficient documentation

## 2013-03-04 DIAGNOSIS — R454 Irritability and anger: Secondary | ICD-10-CM | POA: Insufficient documentation

## 2013-03-04 DIAGNOSIS — F325 Major depressive disorder, single episode, in full remission: Secondary | ICD-10-CM | POA: Insufficient documentation

## 2013-04-12 ENCOUNTER — Encounter: Payer: Self-pay | Admitting: *Deleted

## 2013-07-09 ENCOUNTER — Encounter: Payer: Self-pay | Admitting: Nurse Practitioner

## 2013-07-14 ENCOUNTER — Ambulatory Visit: Payer: Self-pay | Admitting: Nurse Practitioner

## 2013-08-15 ENCOUNTER — Encounter: Payer: Self-pay | Admitting: Internal Medicine

## 2013-10-08 ENCOUNTER — Other Ambulatory Visit: Payer: Self-pay | Admitting: Neurology

## 2013-10-10 NOTE — Telephone Encounter (Signed)
PATIENT NO SHOWED LAST APPT

## 2013-10-21 DIAGNOSIS — I6990 Unspecified sequelae of unspecified cerebrovascular disease: Secondary | ICD-10-CM | POA: Insufficient documentation

## 2013-10-21 DIAGNOSIS — N1832 Chronic kidney disease, stage 3b: Secondary | ICD-10-CM | POA: Insufficient documentation

## 2013-10-21 DIAGNOSIS — N1831 Chronic kidney disease, stage 3a: Secondary | ICD-10-CM | POA: Insufficient documentation

## 2013-11-15 ENCOUNTER — Other Ambulatory Visit: Payer: Self-pay | Admitting: Neurology

## 2013-11-15 DIAGNOSIS — Z0289 Encounter for other administrative examinations: Secondary | ICD-10-CM

## 2013-11-15 NOTE — Telephone Encounter (Signed)
Patient no showed last appt.  Angie spoke with spouse today and collected NS fee.  They were asked to reschedule appt.

## 2013-12-26 ENCOUNTER — Other Ambulatory Visit: Payer: Self-pay | Admitting: Neurology

## 2014-02-02 ENCOUNTER — Emergency Department (HOSPITAL_COMMUNITY): Payer: BC Managed Care – PPO

## 2014-02-02 ENCOUNTER — Encounter (HOSPITAL_COMMUNITY): Payer: Self-pay | Admitting: Emergency Medicine

## 2014-02-02 ENCOUNTER — Emergency Department (HOSPITAL_COMMUNITY)
Admission: EM | Admit: 2014-02-02 | Discharge: 2014-02-02 | Disposition: A | Discharge status: Home / Self Care | Payer: BC Managed Care – PPO | Attending: Emergency Medicine | Admitting: Emergency Medicine

## 2014-02-02 DIAGNOSIS — Z79899 Other long term (current) drug therapy: Secondary | ICD-10-CM | POA: Insufficient documentation

## 2014-02-02 DIAGNOSIS — R55 Syncope and collapse: Secondary | ICD-10-CM | POA: Insufficient documentation

## 2014-02-02 DIAGNOSIS — E860 Dehydration: Secondary | ICD-10-CM | POA: Insufficient documentation

## 2014-02-02 DIAGNOSIS — Z9089 Acquired absence of other organs: Secondary | ICD-10-CM | POA: Insufficient documentation

## 2014-02-02 DIAGNOSIS — R42 Dizziness and giddiness: Secondary | ICD-10-CM | POA: Insufficient documentation

## 2014-02-02 DIAGNOSIS — E119 Type 2 diabetes mellitus without complications: Secondary | ICD-10-CM | POA: Insufficient documentation

## 2014-02-02 DIAGNOSIS — Z7902 Long term (current) use of antithrombotics/antiplatelets: Secondary | ICD-10-CM | POA: Insufficient documentation

## 2014-02-02 DIAGNOSIS — Z794 Long term (current) use of insulin: Secondary | ICD-10-CM | POA: Insufficient documentation

## 2014-02-02 DIAGNOSIS — Z8673 Personal history of transient ischemic attack (TIA), and cerebral infarction without residual deficits: Secondary | ICD-10-CM | POA: Insufficient documentation

## 2014-02-02 DIAGNOSIS — I10 Essential (primary) hypertension: Secondary | ICD-10-CM | POA: Insufficient documentation

## 2014-02-02 DIAGNOSIS — Z85820 Personal history of malignant melanoma of skin: Secondary | ICD-10-CM | POA: Insufficient documentation

## 2014-02-02 DIAGNOSIS — Z9849 Cataract extraction status, unspecified eye: Secondary | ICD-10-CM | POA: Insufficient documentation

## 2014-02-02 DIAGNOSIS — Z9852 Vasectomy status: Secondary | ICD-10-CM | POA: Insufficient documentation

## 2014-02-02 DIAGNOSIS — Z87891 Personal history of nicotine dependence: Secondary | ICD-10-CM | POA: Insufficient documentation

## 2014-02-02 LAB — CBC WITH DIFFERENTIAL/PLATELET
Basophils Absolute: 0 10*3/uL (ref 0.0–0.1)
Basophils Relative: 0 % (ref 0–1)
EOS ABS: 0.2 10*3/uL (ref 0.0–0.7)
Eosinophils Relative: 2 % (ref 0–5)
HCT: 41 % (ref 39.0–52.0)
HEMOGLOBIN: 14.5 g/dL (ref 13.0–17.0)
LYMPHS ABS: 2.8 10*3/uL (ref 0.7–4.0)
LYMPHS PCT: 30 % (ref 12–46)
MCH: 31 pg (ref 26.0–34.0)
MCHC: 35.4 g/dL (ref 30.0–36.0)
MCV: 87.6 fL (ref 78.0–100.0)
MONOS PCT: 11 % (ref 3–12)
Monocytes Absolute: 1.1 10*3/uL — ABNORMAL HIGH (ref 0.1–1.0)
NEUTROS PCT: 56 % (ref 43–77)
Neutro Abs: 5.3 10*3/uL (ref 1.7–7.7)
PLATELETS: 244 10*3/uL (ref 150–400)
RBC: 4.68 MIL/uL (ref 4.22–5.81)
RDW: 13.3 % (ref 11.5–15.5)
WBC: 9.4 10*3/uL (ref 4.0–10.5)

## 2014-02-02 LAB — COMPREHENSIVE METABOLIC PANEL
ALK PHOS: 78 U/L (ref 39–117)
ALT: 26 U/L (ref 0–53)
AST: 33 U/L (ref 0–37)
Albumin: 3.4 g/dL — ABNORMAL LOW (ref 3.5–5.2)
BILIRUBIN TOTAL: 0.3 mg/dL (ref 0.3–1.2)
BUN: 26 mg/dL — AB (ref 6–23)
CHLORIDE: 101 meq/L (ref 96–112)
CO2: 20 meq/L (ref 19–32)
Calcium: 8.8 mg/dL (ref 8.4–10.5)
Creatinine, Ser: 1.58 mg/dL — ABNORMAL HIGH (ref 0.50–1.35)
GFR, EST AFRICAN AMERICAN: 52 mL/min — AB (ref >90)
GFR, EST NON AFRICAN AMERICAN: 45 mL/min — AB (ref >90)
GLUCOSE: 228 mg/dL — AB (ref 70–99)
POTASSIUM: 5 meq/L (ref 3.7–5.3)
SODIUM: 139 meq/L (ref 137–147)
Total Protein: 7 g/dL (ref 6.0–8.3)

## 2014-02-02 LAB — TROPONIN I

## 2014-02-02 MED ORDER — PROMETHAZINE HCL 25 MG/ML IJ SOLN
12.5000 mg | Freq: Once | INTRAMUSCULAR | Status: AC
  Administered 2014-02-02: 12.5 mg via INTRAVENOUS
  Filled 2014-02-02: qty 1

## 2014-02-02 MED ORDER — SODIUM CHLORIDE 0.9 % IV BOLUS (SEPSIS)
1000.0000 mL | Freq: Once | INTRAVENOUS | Status: AC
  Administered 2014-02-02: 1000 mL via INTRAVENOUS

## 2014-02-02 NOTE — ED Notes (Addendum)
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  No fainting nor loss of LOC.  Pt has been having diarrhea since Sunday.  12 lead EKG per EMS - sinus tachy, occasional PVC.

## 2014-02-02 NOTE — Discharge Instructions (Signed)
Increase your fluid intake by 3-8 ounce glasses of water daily for the next several days.  Followup with your primary Dr. within the next week to have your kidney function rechecked.  Return to the emergency department if you develop chest pain, difficulty breathing, worsening of your symptoms, or other new or concerning symptoms.   Dizziness Dizziness is a common problem. It is a feeling of unsteadiness or lightheadedness. You may feel like you are about to faint. Dizziness can lead to injury if you stumble or fall. A person of any age group can suffer from dizziness, but dizziness is more common in older adults. CAUSES  Dizziness can be caused by many different things, including:  Middle ear problems.  Standing for too long.  Infections.  An allergic reaction.  Aging.  An emotional response to something, such as the sight of blood.  Side effects of medicines.  Fatigue.  Problems with circulation or blood pressure.  Excess use of alcohol, medicines, or illegal drug use.  Breathing too fast (hyperventilation).  An arrhythmia or problems with your heart rhythm.  Low red blood cell count (anemia).  Pregnancy.  Vomiting, diarrhea, fever, or other illnesses that cause dehydration.  Diseases or conditions such as Parkinson's disease, high blood pressure (hypertension), diabetes, and thyroid problems.  Exposure to extreme heat. DIAGNOSIS  To find the cause of your dizziness, your caregiver may do a physical exam, lab tests, radiologic imaging scans, or an electrocardiography test (ECG).  TREATMENT  Treatment of dizziness depends on the cause of your symptoms and can vary greatly. HOME CARE INSTRUCTIONS   Drink enough fluids to keep your urine clear or pale yellow. This is especially important in very hot weather. In the elderly, it is also important in cold weather.  If your dizziness is caused by medicines, take them exactly as directed. When taking blood pressure  medicines, it is especially important to get up slowly.  Rise slowly from chairs and steady yourself until you feel okay.  In the morning, first sit up on the side of the bed. When this seems okay, stand slowly while holding onto something until you know your balance is fine.  If you need to stand in one place for a long time, be sure to move your legs often. Tighten and relax the muscles in your legs while standing.  If dizziness continues to be a problem, have someone stay with you for a day or two. Do this until you feel you are well enough to stay alone. Have the person call your caregiver if he or she notices changes in you that are concerning.  Do not drive or use heavy machinery if you feel dizzy.  Do not drink alcohol. SEEK IMMEDIATE MEDICAL CARE IF:   Your dizziness or lightheadedness gets worse.  You feel nauseous or vomit.  You develop problems with talking, walking, weakness, or using your arms, hands, or legs.  You are not thinking clearly or you have difficulty forming sentences. It may take a friend or family member to determine if your thinking is normal.  You develop chest pain, abdominal pain, shortness of breath, or sweating.  Your vision changes.  You notice any bleeding.  You have side effects from medicine that seems to be getting worse rather than better. MAKE SURE YOU:   Understand these instructions.  Will watch your condition.  Will get help right away if you are not doing well or get worse. Document Released: 05/06/2001 Document Revised: 02/02/2012 Document Reviewed:  05/30/2011 Exit[employer] Patient Information 2014 [place].  Dehydration, Adult Dehydration is when you lose more fluids from the body than you take in. Vital organs like the kidneys, brain, and heart cannot function without a proper amount of fluids and salt. Any loss of fluids from the body can cause dehydration.  CAUSES   Vomiting.  Diarrhea.  Excessive  sweating.  Excessive urine output.  Fever. SYMPTOMS  Mild dehydration  Thirst.  Dry lips.  Slightly dry mouth. Moderate dehydration  Very dry mouth.  Sunken eyes.  Skin does not bounce back quickly when lightly pinched and released.  Dark urine and decreased urine production.  Decreased tear production.  Headache. Severe dehydration  Very dry mouth.  Extreme thirst.  Rapid, weak pulse (more than 100 beats per minute at rest).  Cold hands and feet.  Not able to sweat in spite of heat and temperature.  Rapid breathing.  Blue lips.  Confusion and lethargy.  Difficulty being awakened.  Minimal urine production.  No tears. DIAGNOSIS  Your caregiver will diagnose dehydration based on your symptoms and your exam. Blood and urine tests will help confirm the diagnosis. The diagnostic evaluation should also identify the cause of dehydration. TREATMENT  Treatment of mild or moderate dehydration can often be done at home by increasing the amount of fluids that you drink. It is best to drink small amounts of fluid more often. Drinking too much at one time can make vomiting worse. Refer to the home care instructions below. Severe dehydration needs to be treated at the hospital where you will probably be given intravenous (IV) fluids that contain water and electrolytes. HOME CARE INSTRUCTIONS   Ask your caregiver about specific rehydration instructions.  Drink enough fluids to keep your urine clear or pale yellow.  Drink small amounts frequently if you have nausea and vomiting.  Eat as you normally do.  Avoid:  Foods or drinks high in sugar.  Carbonated drinks.  Juice.  Extremely hot or cold fluids.  Drinks with caffeine.  Fatty, greasy foods.  Alcohol.  Tobacco.  Overeating.  Gelatin desserts.  Wash your hands well to avoid spreading bacteria and viruses.  Only take over-the-counter or prescription medicines for pain, discomfort, or fever as  directed by your caregiver.  Ask your caregiver if you should continue all prescribed and over-the-counter medicines.  Keep all follow-up appointments with your caregiver. SEEK MEDICAL CARE IF:  You have abdominal pain and it increases or stays in one area (localizes).  You have a rash, stiff neck, or severe headache.  You are irritable, sleepy, or difficult to awaken.  You are weak, dizzy, or extremely thirsty. SEEK IMMEDIATE MEDICAL CARE IF:   You are unable to keep fluids down or you get worse despite treatment.  You have frequent episodes of vomiting or diarrhea.  You have blood or green matter (bile) in your vomit.  You have blood in your stool or your stool looks black and tarry.  You have not urinated in 6 to 8 hours, or you have only urinated a small amount of very dark urine.  You have a fever.  You faint. MAKE SURE YOU:   Understand these instructions.  Will watch your condition.  Will get help right away if you are not doing well or get worse. Document Released: 11/10/2005 Document Revised: 02/02/2012 Document Reviewed: 06/30/2011 [facility] Patient Information 2014 [place], [place].

## 2014-02-02 NOTE — ED Notes (Signed)
MD Delo at bedside. 

## 2014-02-02 NOTE — ED Notes (Signed)
blood sent to lab

## 2014-02-02 NOTE — ED Provider Notes (Signed)
CSN: [id number]     Arrival date & time 02/02/14  1040 History   First MD Initiated Contact with Patient 02/02/14 1049     Chief Complaint  Patient presents with  . Dizziness     (Consider location/radiation/quality/duration/timing/severity/associated sxs/prior Treatment) HPI Comments: Patient is a 63 year old male with history of diabetes, hypertension, CVA. 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 He states that he became suddenly dizzy and felt as though he might pass out. He continues to feel somewhat strange. He denies any chest pain or difficulty breathing. He denies any severe headache, neck pain, and denies numbness in his arms or legs. He does report recent GI illness, but denies any bloody stool or severe abdominal pain.  Patient is a 63 y.o. male presenting with dizziness. The history is provided by the patient.  Dizziness Quality:  Lightheadedness Severity:  Moderate Onset quality:  Sudden Duration:  1 hour Timing:  Constant Progression:  Improving Chronicity:  New Relieved by:  Nothing Worsened by:  Nothing tried   Past Medical History  Diagnosis Date  . Hypertension   . Diabetes mellitus   . CVA (cerebral vascular accident)   . Melanoma   . Hx of appendectomy   . Hx of tonsillectomy   . H/O: vasectomy    Past Surgical History  Procedure Laterality Date  . Nasal septum surgery    . Cataract extraction, bilateral    . Arthroscopic knee surgery Bilateral   . Tonsillectomy    . Appendectomy     Family History  Problem Relation Age of Onset  . Cerebral aneurysm Mother   . Tremor Mother   . Alzheimer's disease Father   . Tremor Brother   . Tremor Maternal Uncle    History  Substance Use Topics  . Smoking status: Former Smoker    Quit date: 11/25/1983  . Smokeless tobacco: Never Used  . Alcohol Use: Yes     Comment: occassionally    Review of Systems  Neurological: Positive for dizziness.   All other systems reviewed and are negative.      Allergies  Review of patient's allergies indicates no known allergies.  Home Medications   Current Outpatient Rx  Name  Route  Sig  Dispense  Refill  . clopidogrel (PLAVIX) 75 MG tablet   Oral   Take 75 mg by mouth daily.         Marland Kitchen ezetimibe (ZETIA) 10 MG tablet   Oral   Take 10 mg by mouth daily.         . fenofibrate 54 MG tablet   Oral   Take 54 mg by mouth daily.         . insulin glargine (LANTUS) 100 UNIT/ML injection   Subcutaneous   Inject 40 Units into the skin daily.         . metFORMIN (GLUCOPHAGE) 1000 MG tablet   Oral   Take 1,000 mg by mouth 2 (two) times daily with a meal.         . Multiple Vitamins-Minerals (MULTIVITAMIN PO)   Oral   Take by mouth.         . Omega-3 Fatty Acids (FISH OIL) 1000 MG CAPS   Oral   Take 1,000 mg by mouth 2 (two) times daily.         . primidone (MYSOLINE) 50 MG tablet      TAKE 1 TABLET TWICE DAILY.   30 tablet  0     PATIENT NEEDS TO SCHEDULE APPT   . rosuvastatin (CRESTOR) 20 MG tablet   Oral   Take 20 mg by mouth daily.         . valsartan-hydrochlorothiazide (DIOVAN HCT) 160-12.5 MG per tablet   Oral   Take 1 tablet by mouth 2 (two) times daily.          BP 110/58  Pulse 102  Temp(Src) 99.3 F (37.4 C) (Oral)  Resp 20  Wt 285 lb (129.275 kg)  SpO2 94% Physical Exam  Nursing note and vitals reviewed. Constitutional: He is oriented to person, place, and time. He appears well-developed and well-nourished. No distress.  HENT:  Head: Normocephalic and atraumatic.  Mouth/Throat: Oropharynx is clear and moist.  Eyes: EOM are normal. Pupils are equal, round, and reactive to light.  Neck: Normal range of motion. Neck supple.  Cardiovascular: Normal rate, regular rhythm and normal heart sounds.   No murmur heard. Pulmonary/Chest: Effort normal and breath sounds normal. No respiratory distress. He has no wheezes.  Abdominal: Soft.  Bowel sounds are normal.  Musculoskeletal: Normal range of motion. He exhibits no edema.  Neurological: He is alert and oriented to person, place, and time. No cranial nerve deficit. He exhibits normal muscle tone. Coordination normal.  Skin: Skin is warm and dry. He is not diaphoretic.    ED Course  Procedures (including critical care time) Labs Review Labs Reviewed  CBC WITH DIFFERENTIAL  COMPREHENSIVE METABOLIC PANEL  TROPONIN I   Imaging Review No results found.   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 This lasted for several minutes he continued to not feel quite right when he arrived here. Workup reveals a normal CBC, however metabolic panel reveals mild elevations of the BUN and creatinine consistent with mild dehydration. He was given a liter of normal saline and Phenergan and is now feeling better. He does admit to some recent GI symptoms and decreased by mouth intake for the past several days. I feel as though he is appropriate for discharge. He was advised to take increased fluids and followup when necessary if his symptoms worsen or change. He should also followup with his primary Dr. in one week for recheck of his metabolic panel.   Veryl Speak, MD 02/02/14 1255

## 2014-02-13 ENCOUNTER — Encounter: Payer: Self-pay | Admitting: Internal Medicine

## 2014-03-22 ENCOUNTER — Other Ambulatory Visit: Payer: Self-pay | Admitting: Neurology

## 2014-03-28 ENCOUNTER — Encounter: Payer: Self-pay | Admitting: Internal Medicine

## 2014-04-28 ENCOUNTER — Other Ambulatory Visit: Payer: Self-pay | Admitting: Neurology

## 2014-05-21 ENCOUNTER — Ambulatory Visit (INDEPENDENT_AMBULATORY_CARE_PROVIDER_SITE_OTHER): Payer: BC Managed Care – PPO | Admitting: Emergency Medicine

## 2014-05-21 VITALS — BP 110/76 | HR 89 | Temp 98.3°F | Resp 18 | Ht 75.0 in | Wt 285.2 lb

## 2014-05-21 DIAGNOSIS — E1169 Type 2 diabetes mellitus with other specified complication: Secondary | ICD-10-CM

## 2014-05-21 DIAGNOSIS — E11621 Type 2 diabetes mellitus with foot ulcer: Secondary | ICD-10-CM

## 2014-05-21 DIAGNOSIS — L97529 Non-pressure chronic ulcer of other part of left foot with unspecified severity: Principal | ICD-10-CM

## 2014-05-21 DIAGNOSIS — L97509 Non-pressure chronic ulcer of other part of unspecified foot with unspecified severity: Secondary | ICD-10-CM

## 2014-05-21 MED ORDER — DOXYCYCLINE HYCLATE 100 MG PO CAPS: 100.0000 mg | ORAL_CAPSULE | Freq: Two times a day (BID) | ORAL | Status: DC

## 2014-05-21 MED ORDER — CIPROFLOXACIN HCL 500 MG PO TABS: 500.0000 mg | ORAL_TABLET | Freq: Two times a day (BID) | ORAL | Status: DC

## 2014-05-21 NOTE — Patient Instructions (Signed)
Skin Ulcer  A skin ulcer is an open sore that can be shallow or deep. Skin ulcers sometimes become infected and are difficult to treat. It may be 1 month or longer before real healing progress is made.  CAUSES    Injury.   Problems with the veins or arteries.   Diabetes.   Insect bites.   Bedsores.   Inflammatory conditions.  SYMPTOMS    Pain, redness, swelling, and tenderness around the ulcer.   Fever.   Bleeding from the ulcer.   Yellow or clear fluid coming from the ulcer.  DIAGNOSIS   There are many types of skin ulcers. Any open sores will be examined. Certain tests will be done to determine the kind of ulcer you have. The right treatment depends on the type of ulcer you have.  TREATMENT   Treatment is a long-term challenge. It may include:   Wearing an elastic wrap, compression stockings, or gel cast over the ulcer area.   Taking antibiotic medicines or putting antibiotic creams on the affected area if there is an infection.  HOME CARE INSTRUCTIONS   Put on your bandages (dressings), wraps, or casts over the ulcer as directed by your caregiver.   Change all dressings as directed by your caregiver.   Take all medicines as directed by your caregiver.   Keep the affected area clean and dry.   Avoid injuries to the affected area.   Eat a well-balanced, healthy diet that includes plenty of fruit and vegetables.   If you smoke, consider quitting or decreasing the amount of cigarettes you smoke.   Once the ulcer heals, get regular exercise as directed by your caregiver.   Work with your caregiver to make sure your blood pressure, cholesterol, and diabetes are well-controlled.   Keep your skin moisturized. Dry skin can crack and lead to skin ulcers.  SEEK IMMEDIATE MEDICAL CARE IF:    Your pain gets worse.   You have swelling, redness, or fluids around the ulcer.   You have chills.   You have a fever.  MAKE SURE YOU:    Understand these instructions.   Will watch your condition.   Will get  help right away if you are not doing well or get worse.  Document Released: 12/18/2004 Document Revised: 02/02/2012 Document Reviewed: 06/27/2011  Exit[employer] Patient Information 2015 [employer]. This information is not intended to replace advice given to you by your health care provider. Make sure you discuss any questions you have with your health care provider.

## 2014-05-21 NOTE — Progress Notes (Signed)
[facility] [address] [place] [address] [phone]  Date:  05/21/2014   Name:  Joshua [name]   DOB:  1951/04/30   MRN:  [id number]  PCP:  Precious Reel, MD    Chief Complaint: left foot infection   History of Present Illness:  Joshua [name] is a 63 y.o. very pleasant male patient who presents with the following:  History of complicated diabetes.  Now has pain and swelling in great toe for past week after peeling a large piece of skin off the plantar surface of the toe.  Has swelling of foot and lower left leg.  No fever or chills.  No improvement with over the counter medications or other home remedies. Denies other complaint or health concern today.   BS running in 140-180 range.    Patient Active Problem List   Diagnosis Date Noted  . DIABETES MELLITUS-TYPE II 08/07/2008  . GERD 08/07/2008    Past Medical History  Diagnosis Date  . Hypertension   . Diabetes mellitus   . CVA (cerebral vascular accident)   . Melanoma   . Hx of appendectomy   . Hx of tonsillectomy   . H/O: vasectomy     Past Surgical History  Procedure Laterality Date  . Nasal septum surgery    . Cataract extraction, bilateral    . Arthroscopic knee surgery Bilateral   . Tonsillectomy    . Appendectomy      History  Substance Use Topics  . Smoking status: Former Smoker    Quit date: 11/25/1983  . Smokeless tobacco: Never Used  . Alcohol Use: 1.0 oz/week    2 drink(s) per week     Comment: occassionally    Family History  Problem Relation Age of Onset  . Cerebral aneurysm Mother   . Tremor Mother   . Alzheimer's disease Father   . Tremor Brother   . Tremor Maternal Uncle   . Diabetes Maternal Grandmother     No Known Allergies  Medication list has been reviewed and updated.  Current Outpatient Prescriptions on File Prior to Visit  Medication Sig Dispense Refill  . clopidogrel (PLAVIX) 75 MG tablet Take 75 mg by mouth daily with breakfast.      .  escitalopram (LEXAPRO) 20 MG tablet Take 20 mg by mouth at bedtime.      Marland Kitchen ezetimibe (ZETIA) 10 MG tablet Take 10 mg by mouth daily.      . fenofibrate 54 MG tablet Take 54 mg by mouth daily.      Marland Kitchen glyBURIDE (DIABETA) 2.5 MG tablet Take 2.5-5 mg by mouth 2 (two) times daily. Takes 5 mg in the morning and 2.5 mg in the evening      . insulin glargine (LANTUS) 100 UNIT/ML injection Inject 50 Units into the skin 2 (two) times daily.       . LUTEIN PO Take 1 tablet by mouth daily.      . metFORMIN (GLUCOPHAGE) 1000 MG tablet Take 1,000 mg by mouth 2 (two) times daily with a meal.      . Multiple Vitamins-Minerals (MULTIVITAMIN PO) Take by mouth.      . Omega-3 Fatty Acids (FISH OIL) 1000 MG CAPS Take 2,000 mg by mouth daily.       . primidone (MYSOLINE) 50 MG tablet TAKE 1 TABLET TWICE DAILY.  30 tablet  0  . rosuvastatin (CRESTOR) 20 MG tablet Take 20 mg by mouth daily.      Marland Kitchen  valsartan-hydrochlorothiazide (DIOVAN HCT) 160-12.5 MG per tablet Take 1 tablet by mouth 2 (two) times daily.       No current facility-administered medications on file prior to visit.    Review of Systems:  As per HPI, otherwise negative.    Physical Examination: Filed Vitals:   05/21/14 0856  BP: 110/76  Pulse: 89  Temp: 98.3 F (36.8 C)  Resp: 18   Filed Vitals:   05/21/14 0856  Height: 6\' 3"  (1.905 m)  Weight: 285 lb 3.2 oz (129.366 kg)   Body mass index is 35.65 kg/(m^2). Ideal Body Weight: Weight in (lb) to have BMI = 25: 199.6   GEN: WDWN, NAD, Non-toxic, Alert & Oriented x 3 HEENT: Atraumatic, Normocephalic.  Ears and Nose: No external deformity. COR  RRR no murmur EXTR: No clubbing/cyanosis/edema NEURO: antalgic gait.  PSYCH: Normally interactive. Conversant. Not depressed or anxious appearing.  Calm demeanor.  Left great toe swollen and erythematous.  Foot swollen with +1 pitting mid calf.  Large area skin loss on plantar toe with cellulitis  Assessment and Plan: Diabetic ulcer  foot Wound clinic Follow up in one week Antibiotics Elevation Crutches NWB Signed,  Ellison Carwin, MD

## 2014-05-23 ENCOUNTER — Encounter: Payer: Self-pay | Admitting: *Deleted

## 2014-05-24 ENCOUNTER — Ambulatory Visit: Payer: Self-pay | Admitting: Adult Health

## 2014-05-31 ENCOUNTER — Ambulatory Visit (AMBULATORY_SURGERY_CENTER): Payer: Self-pay

## 2014-05-31 VITALS — Ht 75.0 in | Wt 282.0 lb

## 2014-05-31 DIAGNOSIS — Z860100 Personal history of colon polyps, unspecified: Secondary | ICD-10-CM

## 2014-05-31 MED ORDER — MOVIPREP 100 G PO SOLR: 1.0000 | Freq: Once | ORAL | Status: DC

## 2014-05-31 NOTE — Progress Notes (Signed)
No allergies to eggs or soy No past problems with anesthesia No relatives with problems with anesthesia No home oxygen No diet/weight loss meds  Has email  Emmi instructions given for colonoscopy

## 2014-06-05 ENCOUNTER — Encounter: Payer: BC Managed Care – PPO | Admitting: Internal Medicine

## 2014-06-08 ENCOUNTER — Encounter: Payer: Self-pay | Admitting: Internal Medicine

## 2014-06-15 ENCOUNTER — Encounter: Payer: BC Managed Care – PPO | Admitting: Internal Medicine

## 2014-06-15 ENCOUNTER — Ambulatory Visit (AMBULATORY_SURGERY_CENTER): Payer: BC Managed Care – PPO | Admitting: Internal Medicine

## 2014-06-15 ENCOUNTER — Encounter: Payer: Self-pay | Admitting: Internal Medicine

## 2014-06-15 VITALS — BP 137/58 | HR 69 | Temp 96.6°F | Resp 18 | Ht 75.0 in | Wt 282.0 lb

## 2014-06-15 DIAGNOSIS — K5730 Diverticulosis of large intestine without perforation or abscess without bleeding: Secondary | ICD-10-CM

## 2014-06-15 DIAGNOSIS — Z86010 Personal history of colonic polyps: Secondary | ICD-10-CM

## 2014-06-15 LAB — GLUCOSE, CAPILLARY
GLUCOSE-CAPILLARY: 109 mg/dL — AB (ref 70–99)
Glucose-Capillary: 135 mg/dL — ABNORMAL HIGH (ref 70–99)

## 2014-06-15 MED ORDER — SODIUM CHLORIDE 0.9 % IV SOLN: 500.0000 mL | INTRAVENOUS | Status: DC

## 2014-06-15 NOTE — Op Note (Signed)
[place]  [address]. [place], [address]   COLONOSCOPY PROCEDURE REPORT  PATIENT: Joshua [name], Joshua [name]  MR#: [id number] BIRTHDATE: May 05, 1951 , 62  yrs. old GENDER: Male ENDOSCOPIST: Eustace Quail, MD REFERRED PY:[id number] Program Recall PROCEDURE DATE:  06/15/2014 PROCEDURE:   Colonoscopy, surveillance First Screening Colonoscopy - Avg.  risk and is 50 yrs.  old or older - No.  Prior Negative Screening - Now for repeat screening. N/A  History of Adenoma - Now for follow-up colonoscopy & has been > or = to 3 yrs.  Yes hx of adenoma.  Has been 3 or more years since last colonoscopy.  Polyps Removed Today? No.  Recommend repeat exam, <10 yrs? No. ASA CLASS:   Class III INDICATIONS:Patient's personal history of adenomatous colon polyps. Index exam October 2009 with 2 small tubular adenomas. MEDICATIONS: MAC sedation, administered by CRNA and propofol (Diprivan) 450mg  IV  DESCRIPTION OF PROCEDURE:   After the risks benefits and alternatives of the procedure were thoroughly explained, informed consent was obtained.  A digital rectal exam revealed no abnormalities of the rectum.   The [id number]  endoscope was introduced through the anus and advanced to the cecum, which was identified by both the appendix and ileocecal valve. No adverse events experienced.   The quality of the prep was good, using MoviPrep  The instrument was then slowly withdrawn as the colon was fully examined.      COLON FINDINGS: Severe diverticulosis was noted  in the left colon. The colon was otherwise normal.  There was no  inflammation, polyps or cancers unless previously stated.  Retroflexed views revealed internal hemorrhoids. The time to cecum=5 minutes 53 seconds.  Withdrawal time=17 minutes 47 seconds.  The scope was withdrawn and the procedure completed.  COMPLICATIONS: There were no complications.  ENDOSCOPIC IMPRESSION: 1.   Severe diverticulosis was noted in  the left colon 2.   The colon was otherwise normal  RECOMMENDATIONS: 1. Continue current colorectal surveillance recommendations with a repeat colonoscopy in 10 years.   eSigned:  Eustace Quail, MD 06/15/2014 2:04 PM   cc: [name], MD and The Patient

## 2014-06-15 NOTE — Progress Notes (Signed)
A/ox3, pleased with MAC, report to RN 

## 2014-06-15 NOTE — Patient Instructions (Signed)
YOU HAD AN ENDOSCOPIC PROCEDURE TODAY AT [facility]: Refer to the procedure report that was given to you for any specific questions about what was found during the examination.  If the procedure report does not answer your questions, please call your gastroenterologist to clarify.  If you requested that your care partner not be given the details of your procedure findings, then the procedure report has been included in a sealed envelope for you to review at your convenience later.  YOU SHOULD EXPECT: Some feelings of bloating in the abdomen. Passage of more gas than usual.  Walking can help get rid of the air that was put into your GI tract during the procedure and reduce the bloating. If you had a lower endoscopy (such as a colonoscopy or flexible sigmoidoscopy) you may notice spotting of blood in your stool or on the toilet paper. If you underwent a bowel prep for your procedure, then you may not have a normal bowel movement for a few days.  DIET: Your first meal following the procedure should be a light meal and then it is ok to progress to your normal diet.  A half-sandwich or bowl of soup is an example of a good first meal.  Heavy or fried foods are harder to digest and may make you feel nauseous or bloated.  Likewise meals heavy in dairy and vegetables can cause extra gas to form and this can also increase the bloating.  Drink plenty of fluids but you should avoid alcoholic beverages for 24 hours.  You need to increase the amount of fiber in your diet due to your SEVERE diverticulosis in your colon.  ACTIVITY: Your care partner should take you home directly after the procedure.  You should plan to take it easy, moving slowly for the rest of the day.  You can resume normal activity the day after the procedure however you should NOT DRIVE or use heavy machinery for 24 hours (because of the sedation medicines used during the test).    SYMPTOMS TO REPORT IMMEDIATELY: A gastroenterologist  can be reached at any hour.  During normal business hours, 8:30 AM to 5:00 PM Monday through Friday, call [phone].  After hours and on weekends, please call the GI answering service at [phone] who will take a message and have the physician on call contact you.   Following lower endoscopy (colonoscopy or flexible sigmoidoscopy):  Excessive amounts of blood in the stool  Significant tenderness or worsening of abdominal pains  Swelling of the abdomen that is new, acute  Fever of 100F or higher  FOLLOW UP: If any biopsies were taken you will be contacted by phone or by letter within the next 1-3 weeks.  Call your gastroenterologist if you have not heard about the biopsies in 3 weeks.  Our staff will call the home number listed on your records the next business day following your procedure to check on you and address any questions or concerns that you may have at that time regarding the information given to you following your procedure. This is a courtesy call and so if there is no answer at the home number and we have not heard from you through the emergency physician on call, we will assume that you have returned to your regular daily activities without incident.  SIGNATURES/CONFIDENTIALITY: You and/or your care partner have signed paperwork which will be entered into your electronic medical record.  These signatures attest to the fact that that the information  above on your After Visit Summary has been reviewed and is understood.  Full responsibility of the confidentiality of this discharge information lies with you and/or your care-partner.  Please, read all handouts given to you by your recovery room nurse.

## 2014-06-16 ENCOUNTER — Telehealth: Payer: Self-pay | Admitting: *Deleted

## 2014-06-16 NOTE — Telephone Encounter (Signed)
No answer, message left for the patient. 

## 2014-06-19 ENCOUNTER — Encounter (HOSPITAL_BASED_OUTPATIENT_CLINIC_OR_DEPARTMENT_OTHER): Payer: BC Managed Care – PPO | Attending: Plastic Surgery

## 2014-06-19 DIAGNOSIS — I69998 Other sequelae following unspecified cerebrovascular disease: Secondary | ICD-10-CM

## 2014-06-19 DIAGNOSIS — Z87891 Personal history of nicotine dependence: Secondary | ICD-10-CM | POA: Insufficient documentation

## 2014-06-19 DIAGNOSIS — M109 Gout, unspecified: Secondary | ICD-10-CM | POA: Insufficient documentation

## 2014-06-19 DIAGNOSIS — H539 Unspecified visual disturbance: Secondary | ICD-10-CM | POA: Insufficient documentation

## 2014-06-19 DIAGNOSIS — G4730 Sleep apnea, unspecified: Secondary | ICD-10-CM | POA: Insufficient documentation

## 2014-06-19 DIAGNOSIS — E669 Obesity, unspecified: Secondary | ICD-10-CM | POA: Insufficient documentation

## 2014-06-19 DIAGNOSIS — Z85820 Personal history of malignant melanoma of skin: Secondary | ICD-10-CM | POA: Insufficient documentation

## 2014-06-19 DIAGNOSIS — Z794 Long term (current) use of insulin: Secondary | ICD-10-CM | POA: Insufficient documentation

## 2014-06-19 DIAGNOSIS — I739 Peripheral vascular disease, unspecified: Secondary | ICD-10-CM | POA: Insufficient documentation

## 2014-06-19 DIAGNOSIS — Z79899 Other long term (current) drug therapy: Secondary | ICD-10-CM | POA: Insufficient documentation

## 2014-06-19 DIAGNOSIS — I10 Essential (primary) hypertension: Secondary | ICD-10-CM | POA: Insufficient documentation

## 2014-06-19 DIAGNOSIS — L97509 Non-pressure chronic ulcer of other part of unspecified foot with unspecified severity: Secondary | ICD-10-CM | POA: Insufficient documentation

## 2014-06-19 DIAGNOSIS — E1169 Type 2 diabetes mellitus with other specified complication: Secondary | ICD-10-CM | POA: Insufficient documentation

## 2014-06-19 DIAGNOSIS — K219 Gastro-esophageal reflux disease without esophagitis: Secondary | ICD-10-CM | POA: Insufficient documentation

## 2014-06-20 NOTE — Consult Note (Signed)
NAMEMarland Kitchen  Joshua [name], Joshua [name] NO.:  [id number]  MEDICAL RECORD NO.:  [id number]  LOCATION:  FOOT                         FACILITY:  [facility]  PHYSICIAN:  Irene Limbo, MD   DATE OF BIRTH:  Mar 15, 1951  DATE OF CONSULTATION:  06/19/2014 DATE OF DISCHARGE:                                CONSULTATION   CHIEF COMPLAINT:  Right plantar foot ulceration in the setting of diabetes mellitus.  HISTORY OF PRESENT ILLNESS:  The patient is a 63 year old ambulatory male with diabetes that presents with approximately 20-month history of toe ulceration that developed at the end of June 2015 while the patient was on vacation and describes large pieces of skin peeling off of his foot.  This is his first ulceration.  He has never used any diabetic shoes.  The patient does have a history of peripheral vascular disease, and prior stroke.  He used Plavix in the past but reports that he has not been on this for several weeks time.  He has a remote history of smoking.  PAST MEDICAL HISTORY: 1. Hypertension. 2. Diabetes mellitus. 3. CVA. 4. Melanoma. 5. Essential tremor. 6. Vertigo. 7. Ischemic optic neuropathy secondary to his stroke on the left side     and the right parietal stroke. 8. Sleep apnea on CPAP. 9. Reflux. 10.Obesity. 11.Gout.  PAST SURGICAL HISTORY: 1. Tonsillectomy. 2. Appendectomy. 3. Bilateral cataract extraction. 4. Vasectomy.  SOCIAL HISTORY:  The patient reports he quit smoking in 1985.  There are no recent laboratories for review.  There are no x-rays for review.  MEDICATIONS:  Insulin, Diovan, Crestor, Mysoline, Glucophage, lutein, glyburide, Zetia, fenofibrate, Lexapro.  PHYSICAL EXAMINATION:  VITAL SIGNS:  Blood pressure is 132/77, temperature is 99.1, pulse 85, blood sugar is 143.  ABI is calculated as 1.29. EXTREMITIES:  The patient has absent sensation over the left great toe. The remainder of the tested points by Semmes-Weinstein test  were positive.  The patient does have edema of the foot, that is nonpitting. Calf circumference 43 cm, ankle is 28 cm.  A Wagner 2 ulceration present over the hallux on the left foot is measured at 0.5 x 1 x 0.1 cm.  Wound is clean.  No debridement was performed.  ASSESSMENT:  A Wagner 2 ulceration over the left foot.  We reviewed need for offloading.  We will plan for placement of easy cast and institute of collagen.  He will return in 2-3 days' time for a cast check.  We will obtain full laboratory and refer him for screening ABI through [facility].          ______________________________ Irene Limbo, MD MBA     BT/MEDQ  D:  06/20/2014  T:  06/20/2014  Job:  [id number]

## 2014-06-23 ENCOUNTER — Other Ambulatory Visit (HOSPITAL_COMMUNITY): Payer: Self-pay | Admitting: Plastic Surgery

## 2014-06-23 ENCOUNTER — Ambulatory Visit (HOSPITAL_COMMUNITY)
Admission: RE | Admit: 2014-06-23 | Discharge: 2014-06-23 | Disposition: A | Discharge status: Home / Self Care | Payer: BC Managed Care – PPO | Source: Ambulatory Visit | Attending: Vascular Surgery | Admitting: Vascular Surgery

## 2014-06-23 DIAGNOSIS — S91109A Unspecified open wound of unspecified toe(s) without damage to nail, initial encounter: Secondary | ICD-10-CM | POA: Insufficient documentation

## 2014-06-23 DIAGNOSIS — Y929 Unspecified place or not applicable: Secondary | ICD-10-CM | POA: Insufficient documentation

## 2014-06-23 DIAGNOSIS — S91102A Unspecified open wound of left great toe without damage to nail, initial encounter: Secondary | ICD-10-CM

## 2014-06-23 DIAGNOSIS — X58XXXA Exposure to other specified factors, initial encounter: Secondary | ICD-10-CM | POA: Insufficient documentation

## 2014-06-23 DIAGNOSIS — Y999 Unspecified external cause status: Secondary | ICD-10-CM | POA: Insufficient documentation

## 2014-06-26 ENCOUNTER — Encounter (HOSPITAL_BASED_OUTPATIENT_CLINIC_OR_DEPARTMENT_OTHER): Payer: BC Managed Care – PPO | Attending: Plastic Surgery

## 2014-06-26 DIAGNOSIS — E1169 Type 2 diabetes mellitus with other specified complication: Secondary | ICD-10-CM | POA: Insufficient documentation

## 2014-06-26 DIAGNOSIS — G4733 Obstructive sleep apnea (adult) (pediatric): Secondary | ICD-10-CM | POA: Diagnosis not present

## 2014-06-26 DIAGNOSIS — I739 Peripheral vascular disease, unspecified: Secondary | ICD-10-CM | POA: Insufficient documentation

## 2014-06-26 DIAGNOSIS — G252 Other specified forms of tremor: Secondary | ICD-10-CM

## 2014-06-26 DIAGNOSIS — L97409 Non-pressure chronic ulcer of unspecified heel and midfoot with unspecified severity: Secondary | ICD-10-CM | POA: Insufficient documentation

## 2014-06-26 DIAGNOSIS — Z87891 Personal history of nicotine dependence: Secondary | ICD-10-CM | POA: Insufficient documentation

## 2014-06-26 DIAGNOSIS — E669 Obesity, unspecified: Secondary | ICD-10-CM | POA: Insufficient documentation

## 2014-06-26 DIAGNOSIS — G250 Essential tremor: Secondary | ICD-10-CM | POA: Diagnosis not present

## 2014-06-26 DIAGNOSIS — I10 Essential (primary) hypertension: Secondary | ICD-10-CM | POA: Diagnosis not present

## 2014-06-26 DIAGNOSIS — I69998 Other sequelae following unspecified cerebrovascular disease: Secondary | ICD-10-CM

## 2014-06-26 DIAGNOSIS — Z9989 Dependence on other enabling machines and devices: Secondary | ICD-10-CM | POA: Diagnosis not present

## 2014-06-26 DIAGNOSIS — Z794 Long term (current) use of insulin: Secondary | ICD-10-CM | POA: Insufficient documentation

## 2014-06-26 DIAGNOSIS — H539 Unspecified visual disturbance: Secondary | ICD-10-CM | POA: Insufficient documentation

## 2014-06-26 NOTE — Progress Notes (Signed)
[facility]  NAME:  Joshua [name], Joshua [name]               ACCOUNT NO.:  [id number]  MEDICAL RECORD NO.:  [id number]      DATE OF BIRTH:  26-Mar-1951  PHYSICIAN:  Irene Limbo, MD    VISIT DATE:  06/26/2014                                  OFFICE VISIT   CHIEF COMPLAINT:  Followup of foot Wagner 2 ulceration.  HISTORY OF PRESENT ILLNESS:  The patient is a 63 year old ambulatory male that was first seen in this clinic 1 week ago.  He was placed into offloading cast for Wagner 2 ulceration.  In addition, he was referred to [facility] for screening ABI and toe brachial index.  The patient was instructed over the phone that his contact cast could be [facility], however, when he presented they had no ability to remove his cast and no examination was done over his left lower extremity.  Right lower extremity, ABI was calculated at 1.25. The patient subsequently presented to the [facility] for pain in his left foot and had the cast removed 3 days ago.  The patient does have a history of gout and feels that his pain was secondary to his gout.  He is not currently taking any medications for his gout.  He states that secondary to the pain in his foot, which he attributes to his gout, he has been off his foot for nearly the whole week due to pain. It appears to be subsiding at this point.  On examination, he has callus present over the entire wound and it does appear to be healed.  ASSESSMENT AND PLAN:  We will plan to recheck the wound in 1 week's time.  I have counseled him that he needs to obtain diabetic shoes and examine his feet daily.  He is going to contact his primary care physician to ensure that this provider is able to provide his diabetic shoe prescription.  Otherwise, we will do this at his next visit.  Also counseled him that the natural history of gout is to have current chronic relapsing pain and he should discuss also with his  primary care physician resumption of his colchicine and allopurinol, which he has taken in the past.  Plan for followup in 1 week's time.          ______________________________ Irene Limbo, MD     BT/MEDQ  D:  06/26/2014  T:  06/26/2014  Job:  [id number]

## 2014-08-14 DIAGNOSIS — Z23 Encounter for immunization: Secondary | ICD-10-CM | POA: Diagnosis not present

## 2014-08-14 DIAGNOSIS — L97509 Non-pressure chronic ulcer of other part of unspecified foot with unspecified severity: Secondary | ICD-10-CM | POA: Diagnosis not present

## 2014-08-14 DIAGNOSIS — E1165 Type 2 diabetes mellitus with hyperglycemia: Secondary | ICD-10-CM | POA: Diagnosis not present

## 2014-08-14 DIAGNOSIS — I10 Essential (primary) hypertension: Secondary | ICD-10-CM | POA: Diagnosis not present

## 2014-08-14 DIAGNOSIS — E1129 Type 2 diabetes mellitus with other diabetic kidney complication: Secondary | ICD-10-CM | POA: Diagnosis not present

## 2014-08-14 DIAGNOSIS — F329 Major depressive disorder, single episode, unspecified: Secondary | ICD-10-CM | POA: Diagnosis not present

## 2014-08-14 DIAGNOSIS — N182 Chronic kidney disease, stage 2 (mild): Secondary | ICD-10-CM | POA: Diagnosis not present

## 2014-08-21 ENCOUNTER — Encounter: Payer: Self-pay | Admitting: Internal Medicine

## 2014-10-11 ENCOUNTER — Encounter: Payer: Self-pay | Admitting: Neurology

## 2014-10-17 ENCOUNTER — Encounter: Payer: Self-pay | Admitting: Neurology

## 2014-11-22 DIAGNOSIS — L570 Actinic keratosis: Secondary | ICD-10-CM | POA: Diagnosis not present

## 2014-11-22 DIAGNOSIS — L821 Other seborrheic keratosis: Secondary | ICD-10-CM | POA: Diagnosis not present

## 2014-11-22 DIAGNOSIS — D225 Melanocytic nevi of trunk: Secondary | ICD-10-CM | POA: Diagnosis not present

## 2014-11-22 DIAGNOSIS — D171 Benign lipomatous neoplasm of skin and subcutaneous tissue of trunk: Secondary | ICD-10-CM | POA: Diagnosis not present

## 2014-12-13 ENCOUNTER — Ambulatory Visit (INDEPENDENT_AMBULATORY_CARE_PROVIDER_SITE_OTHER): Payer: 59 | Admitting: Adult Health

## 2014-12-13 ENCOUNTER — Encounter: Payer: Self-pay | Admitting: Adult Health

## 2014-12-13 VITALS — BP 132/78 | HR 76 | Ht 76.0 in | Wt 293.0 lb

## 2014-12-13 DIAGNOSIS — G250 Essential tremor: Secondary | ICD-10-CM

## 2014-12-13 DIAGNOSIS — R42 Dizziness and giddiness: Secondary | ICD-10-CM

## 2014-12-13 NOTE — Progress Notes (Signed)
I have read the note, and I agree with the clinical assessment and plan.  WILLIS,CHARLES KEITH   

## 2014-12-13 NOTE — Patient Instructions (Signed)
Stop Melatonin- Call me in 1-2 weeks and let me know if your symptoms have improved.  If your symptoms do not improve we will refer to vestibular rehab. We will also start primidone once you call in 1-2 weeks.

## 2014-12-13 NOTE — Progress Notes (Signed)
PATIENT: Joshua [name] DOB: 27-Aug-1951  REASON FOR VISIT: follow up-benign essential tremor, benign positional vertigo HISTORY FROM: patient  HISTORY OF PRESENT ILLNESS: Joshua [name] is a 64 year old male with a history of benign essential tremor and benign positional vertigo. The patient was last seen in 2014 by Dr. Jannifer Franklin. The patient was put on Mysoline for the tremor and had received good benefit from vestibular rehabilitation for the vertigo. He returns today stating he has started having vertigo again. He states that he tried some of the exercises that he was taught in rehab but it has not helped. He states the dizziness has gone on for 3 weeks. It is accompanied with a dull headache and nausea. He states that he feels that his balance has been affected. He does not make any sudden movements- tries to move and stand very slowly. He notices that the dizziness occurs with positional changes. Can occur with head turning or going from a sitting to a standing position. He states the episodes of vertigo are not as severe as they had been in the past. The patient states that the only new medication that he has started is melatonin. He states that now that I mention it, the dizziness started right when melatonin was started. The patient also has an essential tremor that affects primarily the right hand. He states that he stopped taking Mysoline over a year ago. He states that he went to get a refill but needed a follow-up appointment. However he is unsure that he noticed a benefit with the Mysoline.   REVIEW OF SYSTEMS: Out of a complete 14 system review of symptoms, the patient complains only of the following symptoms, and all other reviewed systems are negative.  Dizziness, headache, joint pain, back pain, muscle cramps, nausea   ALLERGIES: No Known Allergies  HOME MEDICATIONS: Outpatient Prescriptions Prior to Visit  Medication Sig Dispense Refill  . doxycycline (VIBRAMYCIN) 100 MG capsule  Take 1 capsule (100 mg total) by mouth 2 (two) times daily. 20 capsule 0  . escitalopram (LEXAPRO) 20 MG tablet Take 20 mg by mouth at bedtime.    Marland Kitchen ezetimibe (ZETIA) 10 MG tablet Take 10 mg by mouth daily.    . fenofibrate 54 MG tablet Take 54 mg by mouth daily.    Marland Kitchen glyBURIDE (DIABETA) 2.5 MG tablet Take 2.5-5 mg by mouth 2 (two) times daily. Takes 5 mg in the morning and 2.5 mg in the evening    . insulin glargine (LANTUS) 100 UNIT/ML injection Inject 50 Units into the skin 2 (two) times daily.     . LUTEIN PO Take 1 tablet by mouth daily.    . metFORMIN (GLUCOPHAGE) 1000 MG tablet Take 1,000 mg by mouth 2 (two) times daily with a meal.    . Multiple Vitamins-Minerals (MULTIVITAMIN PO) Take by mouth.    . Omega-3 Fatty Acids (FISH OIL) 1000 MG CAPS Take 2,000 mg by mouth daily.     . primidone (MYSOLINE) 50 MG tablet TAKE 1 TABLET TWICE DAILY. 30 tablet 0  . rosuvastatin (CRESTOR) 20 MG tablet Take 20 mg by mouth daily.    . valsartan-hydrochlorothiazide (DIOVAN HCT) 160-12.5 MG per tablet Take 1 tablet by mouth 2 (two) times daily.     No facility-administered medications prior to visit.    PAST MEDICAL HISTORY: Past Medical History  Diagnosis Date  . Hypertension   . Diabetes mellitus   . CVA (cerebral vascular accident)   . Melanoma   .  Hx of appendectomy   . Hx of tonsillectomy   . H/O: vasectomy   . Benign essential tremor   . Benign positional vertigo   . Ischemic optic neuropathy     on the left  . Cerebrovascular disease     right parietal stroke  . Dyslipidemia   . OSA on CPAP   . Degenerative arthritis   . Obesity   . GERD (gastroesophageal reflux disease)     hiatal hernia  . Gout     PAST SURGICAL HISTORY: Past Surgical History  Procedure Laterality Date  . Nasal septum surgery    . Cataract extraction Bilateral   . Arthroscopic knee surgery Bilateral   . Tonsillectomy    . Appendectomy      FAMILY HISTORY: Family History  Problem Relation Age of  Onset  . Cerebral aneurysm Mother   . Tremor Mother   . Alzheimer's disease Father   . Tremor Brother   . Tremor Maternal Uncle   . Diabetes Maternal Grandmother   . Colon cancer Neg Hx     SOCIAL HISTORY: History   Social History  . Marital Status: Married    Spouse Name: N/A    Number of Children: 2  . Years of Education: Bachelors    Occupational History  .  [place] History Main Topics  . Smoking status: Former Smoker -- 1.00 packs/day    Types: Cigarettes    Quit date: 11/25/1983  . Smokeless tobacco: Never Used  . Alcohol Use: 1.0 oz/week    2 drink(s) per week     Comment: occassionally  . Drug Use: No  . Sexual Activity: Not on file   Other Topics Concern  . Not on file   Social History Narrative   Patient lives at home with wife.    Patient has 2 children that are in good health.    Patient works for [employer].    Patient has a Bachelors degree in History.       PHYSICAL EXAM  Filed Vitals:   12/13/14 1001  BP: 132/78  Pulse: 76  Height: 6\' 4"  (1.93 m)  Weight: 293 lb (132.904 kg)   Body mass index is 35.68 kg/(m^2).  Generalized: Well developed, in no acute distress   Neurological examination  Mentation: Alert oriented to time, place, history taking. Follows all commands speech and language fluent Cranial nerve II-XII: Pupils were equal round reactive to light. Extraocular movements were full, visual field were full on confrontational test. End Beat nystagmus noted with horizontal gaze to the right. Facial sensation and strength were normal. Uvula tongue midline. Head turning and shoulder shrug  were normal and symmetric. Motor: The motor testing reveals 5 over 5 strength of all 4 extremities. Good symmetric motor tone is noted throughout. Intention tremor in the bilateral hands right>left.  Dix Hallpike maneuver negative Sensory: Sensory testing is intact to soft touch on all 4 extremities. No evidence of  extinction is noted.  Coordination: Cerebellar testing reveals good finger-nose-finger and heel-to-shin bilaterally.  Gait and station: Gait is normal.   Reflexes: Deep tendon reflexes are symmetric and normal bilaterally.    DIAGNOSTIC DATA (LABS, IMAGING, TESTING) - I reviewed patient records, labs, notes, testing and imaging myself where available.  Lab Results  Component Value Date   WBC 9.4 02/02/2014   HGB 14.5 02/02/2014   HCT 41.0 02/02/2014   MCV 87.6 02/02/2014   PLT 244 02/02/2014  Component Value Date/Time   NA 139 02/02/2014 1108   K 5.0 02/02/2014 1108   CL 101 02/02/2014 1108   CO2 20 02/02/2014 1108   GLUCOSE 228* 02/02/2014 1108   BUN 26* 02/02/2014 1108   CREATININE 1.58* 02/02/2014 1108   CALCIUM 8.8 02/02/2014 1108   PROT 7.0 02/02/2014 1108   ALBUMIN 3.4* 02/02/2014 1108   AST 33 02/02/2014 1108   ALT 26 02/02/2014 1108   ALKPHOS 78 02/02/2014 1108   BILITOT 0.3 02/02/2014 1108   GFRNONAA 45* 02/02/2014 1108   GFRAA 52* 02/02/2014 1108   Lab Results  Component Value Date   CHOL 176 04/09/2011   HDL 33* 04/09/2011   LDLCALC  04/09/2011    68        Total Cholesterol/HDL:CHD Risk Coronary Heart Disease Risk Table                     Men   Women  1/2 Average Risk   3.4   3.3  Average Risk       5.0   4.4  2 X Average Risk   9.6   7.1  3 X Average Risk  23.4   11.0        Use the calculated Patient Ratio above and the CHD Risk Table to determine the patient's CHD Risk.        ATP III CLASSIFICATION (LDL):  <100     mg/dL   Optimal  100-129  mg/dL   Near or Above                    Optimal  130-159  mg/dL   Borderline  160-189  mg/dL   High  >190     mg/dL   Very High   TRIG 377* 04/09/2011   CHOLHDL 5.3 04/09/2011   Lab Results  Component Value Date   HGBA1C * 04/09/2011    7.6 (NOTE)                                                                       According to the ADA Clinical Practice Recommendations for 2011, when HbA1c  is used as a screening test:   >=6.5%   Diagnostic of Diabetes Mellitus           (if abnormal result  is confirmed)  5.7-6.4%   Increased risk of developing Diabetes Mellitus  References:Diagnosis and Classification of Diabetes Mellitus,Diabetes GYIR,4854,62(VOJJK 1):S62-S69 and Standards of Medical Care in         Diabetes - 2011,Diabetes Care,2011,34  (Suppl 1):S11-S61.   No results found for: VITAMINB12 Lab Results  Component Value Date   TSH 3.576 04/09/2011      ASSESSMENT AND PLAN 64 y.o. year old male  has a past medical history of Hypertension; Diabetes mellitus; CVA (cerebral vascular accident); Melanoma; appendectomy; tonsillectomy; H/O: vasectomy; Benign essential tremor; Benign positional vertigo; Ischemic optic neuropathy; Cerebrovascular disease; Dyslipidemia; OSA on CPAP; Degenerative arthritis; Obesity; GERD (gastroesophageal reflux disease); and Gout. here with:  1. Dizziness 2. Essential tremor  Since the dizziness occurred after starting melatonin. The patient will stop melatonin and let me know if his symptoms improve after 1-2 weeks. If he does not notice  any improvement we will refer the patient for vestibular rehabilitation. When he calls me in 1-2 weeks we will also restart Mysoline for his essential tremor. The patient agreed to hold off on starting Mysoline until we know if the melatonin was causing his symptoms. He will follow up in 3 months or sooner if needed.   Ward Givens, MSN, NP-C 12/13/2014, 10:22 AM [facility] [address], [place], [place] [address] [phone]  Note: This document was prepared with digital dictation and possible smart phrase technology. Any transcriptional errors that result from this process are unintentional.

## 2015-02-09 ENCOUNTER — Encounter: Payer: Self-pay | Admitting: Adult Health

## 2015-02-09 ENCOUNTER — Ambulatory Visit (INDEPENDENT_AMBULATORY_CARE_PROVIDER_SITE_OTHER): Payer: Medicare Other | Admitting: Adult Health

## 2015-02-09 VITALS — BP 144/85 | HR 70 | Ht 76.0 in | Wt 283.0 lb

## 2015-02-09 DIAGNOSIS — G250 Essential tremor: Secondary | ICD-10-CM | POA: Diagnosis not present

## 2015-02-09 DIAGNOSIS — R42 Dizziness and giddiness: Secondary | ICD-10-CM | POA: Diagnosis not present

## 2015-02-09 NOTE — Progress Notes (Signed)
I have read the note, and I agree with the clinical assessment and plan.  Jadzia Ibsen KEITH   

## 2015-02-09 NOTE — Patient Instructions (Signed)
If tremor worsens let me know- we will consider mysoline Change positions slowly to prevent dizziness If vertigo worsens and episodes become more frequent let us know.  Continue exercises learned in PT.

## 2015-02-09 NOTE — Progress Notes (Signed)
PATIENT: Joshua [name] DOB: 12-Mar-1951  REASON FOR VISIT: follow up- benign essential tremor, dizziness HISTORY FROM: patient  HISTORY OF PRESENT ILLNESS: Joshua [name] is a 64 year old male with a history of benign essential tremor and dizziness. He returns today for follow-up. The patient states that his dizziness did improve once he stopped the melatonin. He states now he will experience dizziness if he changes position suddenly such as going from a sitting to a standing position. He also has dizziness if he turns over in bed specifically to the left too quickly. He describes this dizziness as true vertigo. He will experience nausea with this. He states that if he has a severe dizzy spell usually during the night he will wake up with a headache on the right side, nausea and that usually followed by a loose bowel movement. He states as a child he suffered from severe motion sickness. The patient states that his tremor has remained the same. His tremor is located in both hands right greater than left. He states that he is been able to make adjustments to cope with the tremor. He states that at this time he does not feel that he will need medication. He returns today for an evaluation.  HISTORY 12/13/14: Joshua [name] is a 64 year old male with a history of benign essential tremor and benign positional vertigo. The patient was last seen in 2014 by Dr. Jannifer Franklin. The patient was put on Mysoline for the tremor and had received good benefit from vestibular rehabilitation for the vertigo. He returns today stating he has started having vertigo again. He states that he tried some of the exercises that he was taught in rehab but it has not helped. He states the dizziness has gone on for 3 weeks. It is accompanied with a dull headache and nausea. He states that he feels that his balance has been affected. He does not make any sudden movements- tries to move and stand very slowly. He notices that the dizziness occurs  with positional changes. Can occur with head turning or going from a sitting to a standing position. He states the episodes of vertigo are not as severe as they had been in the past. The patient states that the only new medication that he has started is melatonin. He states that now that I mention it, the dizziness started right when melatonin was started. The patient also has an essential tremor that affects primarily the right hand. He states that he stopped taking Mysoline over a year ago. He states that he went to get a refill but needed a follow-up appointment. However he is unsure that he noticed a benefit with the Mysoline.   REVIEW OF SYSTEMS: Out of a complete 14 system review of symptoms, the patient complains only of the following symptoms, and all other reviewed systems are negative.  Dizziness, headache, itching  ALLERGIES: No Known Allergies  HOME MEDICATIONS: Outpatient Prescriptions Prior to Visit  Medication Sig Dispense Refill  . doxycycline (VIBRAMYCIN) 100 MG capsule Take 1 capsule (100 mg total) by mouth 2 (two) times daily. 20 capsule 0  . escitalopram (LEXAPRO) 20 MG tablet Take 20 mg by mouth at bedtime.    Marland Kitchen ezetimibe (ZETIA) 10 MG tablet Take 10 mg by mouth daily.    . fenofibrate 54 MG tablet Take 54 mg by mouth daily.    Marland Kitchen glyBURIDE (DIABETA) 2.5 MG tablet Take 2.5-5 mg by mouth 2 (two) times daily. Takes 5 mg in the morning and 2.5  mg in the evening    . insulin glargine (LANTUS) 100 UNIT/ML injection Inject 50 Units into the skin 2 (two) times daily.     . LUTEIN PO Take 1 tablet by mouth daily.    . metFORMIN (GLUCOPHAGE) 1000 MG tablet Take 1,000 mg by mouth 2 (two) times daily with a meal.    . Multiple Vitamins-Minerals (MULTIVITAMIN PO) Take by mouth.    . Omega-3 Fatty Acids (FISH OIL) 1000 MG CAPS Take 2,000 mg by mouth daily.     . primidone (MYSOLINE) 50 MG tablet TAKE 1 TABLET TWICE DAILY. 30 tablet 0  . rosuvastatin (CRESTOR) 20 MG tablet Take 20 mg by  mouth daily.    . valsartan-hydrochlorothiazide (DIOVAN HCT) 160-12.5 MG per tablet Take 1 tablet by mouth 2 (two) times daily.     No facility-administered medications prior to visit.    PAST MEDICAL HISTORY: Past Medical History  Diagnosis Date  . Hypertension   . Diabetes mellitus   . CVA (cerebral vascular accident)   . Melanoma   . Hx of appendectomy   . Hx of tonsillectomy   . H/O: vasectomy   . Benign essential tremor   . Benign positional vertigo   . Ischemic optic neuropathy     on the left  . Cerebrovascular disease     right parietal stroke  . Dyslipidemia   . OSA on CPAP   . Degenerative arthritis   . Obesity   . GERD (gastroesophageal reflux disease)     hiatal hernia  . Gout     PAST SURGICAL HISTORY: Past Surgical History  Procedure Laterality Date  . Nasal septum surgery    . Cataract extraction Bilateral   . Arthroscopic knee surgery Bilateral   . Tonsillectomy    . Appendectomy      FAMILY HISTORY: Family History  Problem Relation Age of Onset  . Cerebral aneurysm Mother   . Tremor Mother   . Alzheimer's disease Father   . Tremor Brother   . Tremor Maternal Uncle   . Diabetes Maternal Grandmother   . Colon cancer Neg Hx     SOCIAL HISTORY: History   Social History  . Marital Status: Married    Spouse Name: [name]  . Number of Children: 2  . Years of Education: Bachelors    Occupational History  .  [place] History Main Topics  . Smoking status: Former Smoker -- 1.00 packs/day    Types: Cigarettes    Quit date: 11/25/1983  . Smokeless tobacco: Never Used  . Alcohol Use: 1.2 oz/week    2 Standard drinks or equivalent per week     Comment: occassionally  . Drug Use: No  . Sexual Activity: Not on file   Other Topics Concern  . Not on file   Social History Narrative   Patient lives at home with wife. [name](   Patient has 2 children that are in good health.    Patient works for [employer].  Retired .   Patient has a Bachelors degree in History.       PHYSICAL EXAM  Orthostatic VS for the past 24 hrs:  BP- Lying Pulse- Lying BP- Sitting Pulse- Sitting BP- Standing at 0 minutes Pulse- Standing at 0 minutes  02/09/15 0959 113/66 mmHg 69 144/85 mmHg 70 132/81 mmHg 76       Filed Vitals:   02/09/15 0957  BP: 144/85  Pulse: 70  Height: 6\' 4"  (  1.93 m)  Weight: 283 lb (128.368 kg)   Body mass index is 34.46 kg/(m^2). Generalized: Well developed, in no acute distress   Neurological examination  Mentation: Alert oriented to time, place, history taking. Follows all commands speech and language fluent Cranial nerve II-XII: Pupils were equal round reactive to light. Extraocular movements were full, visual field were full on confrontational test. Facial sensation and strength were normal. Uvula tongue midline. Head turning and shoulder shrug  were normal and symmetric. Motor: The motor testing reveals 5 over 5 strength of all 4 extremities. Good symmetric motor tone is noted throughout. Intention tremor noted in both hands. Sensory: Sensory testing is intact to soft touch on all 4 extremities. No evidence of extinction is noted.  Coordination: Cerebellar testing reveals good finger-nose-finger and heel-to-shin bilaterally.  Gait and station: Gait is normal. Tandem gait is slightly unsteady. Romberg is negative. No drift is seen.  Reflexes: Deep tendon reflexes are symmetric and normal bilaterally.  Marland Kitchen   DIAGNOSTIC DATA (LABS, IMAGING, TESTING) - I reviewed patient records, labs, notes, testing and imaging myself where available.     ASSESSMENT AND PLAN 64 y.o. year old male  has a past medical history of Hypertension; Diabetes mellitus; CVA (cerebral vascular accident); Melanoma; appendectomy; tonsillectomy; H/O: vasectomy; Benign essential tremor; Benign positional vertigo; Ischemic optic neuropathy; Cerebrovascular disease; Dyslipidemia; OSA on CPAP; Degenerative  arthritis; Obesity; GERD (gastroesophageal reflux disease); and Gout. here with:  1. Benign essential tremor 2. Dizziness  The patient's tremor has remained the same. At this time he does not feel that he needs medication. He is able to make adjustments to cope with his tremor. I advised the patient that if his tremor worsens he should let me know and we will consider starting Mysoline. The patient is having what seems like true vertigo episodes at bedtime if he turns quickly to the left. He states that he can usually resolve these episodes by doing some exercises he learned in  vestibular rehabilitation. I have advised the patient that if these episodes become more frequent we can refer him back to the vestibular rehabilitation. He is amenable to this. The patient is also having some dizziness that is different from vertigo when he goes from a sitting to a standing position. He states that if he moves slowly when changing positions the dizziness does not occur. This could represent orthostatic hypotension. His orthostatic vital signs showed a 10 point drop from a sitting to standing position. I have encouraged the patient to continue changing positions slowly in order to avoid dizziness. Of course if his dizziness gets worse he should let us know. Patient verbalized understanding. He will follow-up in 6 months or sooner if needed.  Ward Givens, MSN, NP-C 02/09/2015, 10:11 AM [facility] [address] [place], [place] [address] [phone]  Note: This document was prepared with digital dictation and possible smart phrase technology. Any transcriptional errors that result from this process are unintentional.

## 2015-05-17 DIAGNOSIS — Z0000 Encounter for general adult medical examination without abnormal findings: Secondary | ICD-10-CM | POA: Insufficient documentation

## 2015-08-08 DIAGNOSIS — B070 Plantar wart: Secondary | ICD-10-CM | POA: Insufficient documentation

## 2015-08-14 ENCOUNTER — Encounter: Payer: Self-pay | Admitting: Adult Health

## 2015-08-14 ENCOUNTER — Ambulatory Visit (INDEPENDENT_AMBULATORY_CARE_PROVIDER_SITE_OTHER): Payer: Medicare Other | Admitting: Adult Health

## 2015-08-14 VITALS — BP 141/80 | HR 75 | Ht 76.0 in | Wt 284.0 lb

## 2015-08-14 DIAGNOSIS — G250 Essential tremor: Secondary | ICD-10-CM

## 2015-08-14 DIAGNOSIS — H8110 Benign paroxysmal vertigo, unspecified ear: Secondary | ICD-10-CM | POA: Diagnosis not present

## 2015-08-14 NOTE — Patient Instructions (Signed)
If your symptoms worsen or you develop new symptoms please let us know.   

## 2015-08-14 NOTE — Progress Notes (Signed)
I have read the note, and I agree with the clinical assessment and plan.  WILLIS,CHARLES KEITH   

## 2015-08-14 NOTE — Progress Notes (Signed)
PATIENT: Joshua [name] DOB: 1951-03-18  REASON FOR VISIT: follow up- benign essential tremor, dizziness HISTORY FROM: patient  HISTORY OF PRESENT ILLNESS: Joshua [name] is a 64 year old male with a history of benign essential tremor and dizziness. He returns today for follow-up. The patient states that he has not had a true episode of vertigo in over a year and a half. He states that he continues to have the tremor in both hands. He is not sure if one side is worse than other at this point. He states that he notices that when he is trying to button a shirt, when he eats and with his handwriting. He is a patient at the [facility] hospital. They recommended a referral to occupational therapy to see if the patient gets any benefit from using weighted utensils and weighted pens. At this time the patient does not want to try medication. He returns today for an evaluation.   HISTORY  Joshua [name] is a 64 year old male with a history of benign essential tremor and dizziness. He returns today for follow-up. The patient states that his dizziness did improve once he stopped the melatonin. He states now he will experience dizziness if he changes position suddenly such as going from a sitting to a standing position. He also has dizziness if he turns over in bed specifically to the left too quickly. He describes this dizziness as true vertigo. He will experience nausea with this. He states that if he has a severe dizzy spell usually during the night he will wake up with a headache on the right side, nausea and that usually followed by a loose bowel movement. He states as a child he suffered from severe motion sickness. The patient states that his tremor has remained the same. His tremor is located in both hands right greater than left. He states that he is been able to make adjustments to cope with the tremor. He states that at this time he does not feel that he will need medication. He returns today for an  evaluation.  HISTORY 12/13/14: Joshua [name] is a 64 year old male with a history of benign essential tremor and benign positional vertigo. The patient was last seen in 2014 by Dr. Jannifer [name]. The patient was put on Mysoline for the tremor and had received good benefit from vestibular rehabilitation for the vertigo. He returns today stating he has started having vertigo again. He states that he tried some of the exercises that he was taught in rehab but it has not helped. He states the dizziness has gone on for 3 weeks. It is accompanied with a dull headache and nausea. He states that he feels that his balance has been affected. He does not make any sudden movements- tries to move and stand very slowly. He notices that the dizziness occurs with positional changes. Can occur with head turning or going from a sitting to a standing position. He states the episodes of vertigo are not as severe as they had been in the past. The patient states that the only new medication that he has started is melatonin. He states that now that I mention it, the dizziness started right when melatonin was started. The patient also has an essential tremor that affects primarily the right hand. He states that he stopped taking Mysoline over a year ago. He states that he went to get a refill but needed a follow-up appointment. However he is unsure that he noticed a benefit with the Mysoline.    REVIEW  OF SYSTEMS: Out of a complete 14 system review of symptoms, the patient complains only of the following symptoms, and all other reviewed systems are negative.  Eye itching  ALLERGIES: No Known Allergies  HOME MEDICATIONS: Outpatient Prescriptions Prior to Visit  Medication Sig Dispense Refill  . doxycycline (VIBRAMYCIN) 100 MG capsule Take 1 capsule (100 mg total) by mouth 2 (two) times daily. 20 capsule 0  . escitalopram (LEXAPRO) 20 MG tablet Take 20 mg by mouth at bedtime.    Marland Kitchen ezetimibe (ZETIA) 10 MG tablet Take 10 mg by mouth  daily.    . fenofibrate 54 MG tablet Take 54 mg by mouth daily.    Marland Kitchen glyBURIDE (DIABETA) 2.5 MG tablet Take 2.5-5 mg by mouth 2 (two) times daily. Takes 5 mg in the morning and 2.5 mg in the evening    . LUTEIN PO Take 1 tablet by mouth daily.    . metFORMIN (GLUCOPHAGE) 1000 MG tablet Take 1,000 mg by mouth 2 (two) times daily with a meal.    . Multiple Vitamins-Minerals (MULTIVITAMIN PO) Take by mouth.    . Omega-3 Fatty Acids (FISH OIL) 1000 MG CAPS Take 2,000 mg by mouth daily.     . primidone (MYSOLINE) 50 MG tablet TAKE 1 TABLET TWICE DAILY. 30 tablet 0  . rosuvastatin (CRESTOR) 20 MG tablet Take 20 mg by mouth daily.    . valsartan-hydrochlorothiazide (DIOVAN HCT) 160-12.5 MG per tablet Take 1 tablet by mouth 2 (two) times daily.    . insulin glargine (LANTUS) 100 UNIT/ML injection Inject 50 Units into the skin 2 (two) times daily.      No facility-administered medications prior to visit.    PAST MEDICAL HISTORY: Past Medical History  Diagnosis Date  . Hypertension   . Diabetes mellitus   . CVA (cerebral vascular accident)   . Melanoma   . Hx of appendectomy   . Hx of tonsillectomy   . H/O: vasectomy   . Benign essential tremor   . Benign positional vertigo   . Ischemic optic neuropathy     on the left  . Cerebrovascular disease     right parietal stroke  . Dyslipidemia   . OSA on CPAP   . Degenerative arthritis   . Obesity   . GERD (gastroesophageal reflux disease)     hiatal hernia  . Gout     PAST SURGICAL HISTORY: Past Surgical History  Procedure Laterality Date  . Nasal septum surgery    . Cataract extraction Bilateral   . Arthroscopic knee surgery Bilateral   . Tonsillectomy    . Appendectomy      FAMILY HISTORY: Family History  Problem Relation Age of Onset  . Cerebral aneurysm Mother   . Tremor Mother   . Alzheimer's disease Father   . Tremor Brother   . Tremor Maternal Uncle   . Diabetes Maternal Grandmother   . Colon cancer Neg Hx      SOCIAL HISTORY: Social History   Social History  . Marital Status: Married    Spouse Name: [name]  . Number of Children: 2  . Years of Education: Bachelors    Occupational History  .  [place] History Main Topics  . Smoking status: Former Smoker -- 1.00 packs/day    Types: Cigarettes    Quit date: 11/25/1983  . Smokeless tobacco: Never Used  . Alcohol Use: 1.2 oz/week    2 Standard drinks or equivalent per week  Comment: occassionally  . Drug Use: No  . Sexual Activity: Not on file   Other Topics Concern  . Not on file   Social History Narrative   Patient lives at home with wife. [name](   Patient has 2 children that are in good health.    Patient works for [employer]. Retired .   Patient has a Bachelors degree in History.       PHYSICAL EXAM  Filed Vitals:   08/14/15 0801  BP: 141/80  Pulse: 75  Height: 6\' 4"  (1.93 m)  Weight: 284 lb (128.822 kg)   Body mass index is 34.58 kg/(m^2).  Generalized: Well developed, in no acute distress   Neurological examination  Mentation: Alert oriented to time, place, history taking. Follows all commands speech and language fluent Cranial nerve II-XII: Pupils were equal round reactive to light. Extraocular movements were full, visual field were full on confrontational test. Facial sensation and strength were normal. Uvula tongue midline. Head turning and shoulder shrug  were normal and symmetric. Motor: The motor testing reveals 5 over 5 strength of all 4 extremities. Good symmetric motor tone is noted throughout.  Sensory: Sensory testing is intact to soft touch on all 4 extremities. No evidence of extinction is noted.  Coordination: Cerebellar testing reveals good finger-nose-finger and heel-to-shin bilaterally.  Gait and station: Gait is normal. Tandem gait is normal. Romberg is negative. No drift is seen.  Reflexes: Deep tendon reflexes are symmetric and normal bilaterally.    DIAGNOSTIC DATA (LABS, IMAGING, TESTING) - I reviewed patient records, labs, notes, testing and imaging myself where available.  Lab Results  Component Value Date   WBC 9.4 02/02/2014   HGB 14.5 02/02/2014   HCT 41.0 02/02/2014   MCV 87.6 02/02/2014   PLT 244 02/02/2014      Component Value Date/Time   NA 139 02/02/2014 1108   K 5.0 02/02/2014 1108   CL 101 02/02/2014 1108   CO2 20 02/02/2014 1108   GLUCOSE 228* 02/02/2014 1108   BUN 26* 02/02/2014 1108   CREATININE 1.58* 02/02/2014 1108   CALCIUM 8.8 02/02/2014 1108   PROT 7.0 02/02/2014 1108   ALBUMIN 3.4* 02/02/2014 1108   AST 33 02/02/2014 1108   ALT 26 02/02/2014 1108   ALKPHOS 78 02/02/2014 1108   BILITOT 0.3 02/02/2014 1108   GFRNONAA 25* 02/02/2014 1108   GFRAA 52* 02/02/2014 1108     ASSESSMENT AND PLAN 64 y.o. year old male  has a past medical history of Hypertension; Diabetes mellitus; CVA (cerebral vascular accident); Melanoma; appendectomy; tonsillectomy; H/O: vasectomy; Benign essential tremor; Benign positional vertigo; Ischemic optic neuropathy; Cerebrovascular disease; Dyslipidemia; OSA on CPAP; Degenerative arthritis; Obesity; GERD (gastroesophageal reflux disease); and Gout. here with:  1. Vertigo 2. Essential tremor  Overall the patient is doing well. He is not had a episode of vertigo in quite some time. His tremor seems to be controlled at this time. He does not want to try medication. He is going to occupational therapy to try using weighted utensils and pens. Patient advised that if his symptoms worsen or he develops new symptoms he should let us know. Otherwise he will follow-up in one year or sooner if needed.   Ward Givens, MSN, NP-C 08/14/2015, 8:16 AM [facility] [address], [place], [place] [address] [phone]

## 2015-09-14 ENCOUNTER — Ambulatory Visit: Payer: Medicare Other | Admitting: Podiatry

## 2015-09-14 ENCOUNTER — Ambulatory Visit: Payer: Medicare Other

## 2015-09-14 ENCOUNTER — Ambulatory Visit (INDEPENDENT_AMBULATORY_CARE_PROVIDER_SITE_OTHER): Payer: Medicare Other | Admitting: Podiatry

## 2015-09-14 ENCOUNTER — Encounter: Payer: Self-pay | Admitting: Podiatry

## 2015-09-14 VITALS — BP 139/83 | HR 84 | Resp 18

## 2015-09-14 DIAGNOSIS — E11621 Type 2 diabetes mellitus with foot ulcer: Secondary | ICD-10-CM | POA: Diagnosis not present

## 2015-09-14 DIAGNOSIS — L97529 Non-pressure chronic ulcer of other part of left foot with unspecified severity: Secondary | ICD-10-CM | POA: Diagnosis not present

## 2015-09-14 DIAGNOSIS — R52 Pain, unspecified: Secondary | ICD-10-CM | POA: Diagnosis not present

## 2015-09-14 NOTE — Progress Notes (Signed)
   Subjective:    Patient ID: [name], male    DOB: [birth year], 64 y.o.   MRN: [id number]  HPI   64 year old male percent the office they with concerns of painful callus the bottom of his left which is been ongoing for approximate 4 months. He has pain to this area opening pressure down or wearing shoes. He denies any surrounding redness or drainage coming from this area. He is been using some pads which does not seem to help as well as soaking. No other complaints at this time. He is diabetic and states his A1c was high. He denies any claudication symptoms. He does have a history of a ulceration to the left hallux.    Review of Systems  All other systems reviewed and are negative.      Objective:   Physical Exam General: AAO x3, NAD  Dermatological: Skin is warm, dry and supple bilateral.  There is a hyperkeratotic lesion left foot submetatarsal 4. Upon debridement there is an open ulcer which appeared to be superficial granular. There is no probing, undermining, tunneling. There is no swelling erythema, ascending  cellulitis,  fluctuation, crepitus, malodor, drainage/purulence. On the right hallux he did stub his toe and there is a superficial abrasion to the medial aspect of the hallux just adjacent to the nail border. There is no surrounding erythema, ascending cellulitis, fluctuance, crepitus, malodor, drainage/purulence. No clinical signs of infection at this time to either area. There are no other open sores, no preulcerative lesions, no rash or signs of infection present.  Vascular: Dorsalis Pedis artery and Posterior Tibial artery pedal pulses are 2/4 bilateral with immedate capillary fill time. Pedal hair growth present. No varicosities and no lower extremity edema present bilateral. There is no pain with calf compression, swelling, warmth, erythema.   Neruologic: Grossly intact via light touch bilateral. Vibratory intact via tuning fork bilateral. Protective threshold with  Semmes Wienstein monofilament intact to all pedal sites bilateral. Patellar and Achilles deep tendon reflexes 2+ bilateral. No Babinski or clonus noted bilateral.   Musculoskeletal: No gross boney pedal deformities bilateral. No pain, crepitus, or limitation noted with foot and ankle range of motion bilateral. Muscular strength 5/5 in all groups tested bilateral.  Gait: Unassisted, Nonantalgic.      Assessment & Plan:   65 year old male left foot ulceration, right hallux superficial wound -Treatment options discussed including all alternatives, risks, and complications -Hyperkeratotic lesion left foot Chevron debrided without, complications. Area was cleaned. Recommended continue with and about equivalent dressing changes daily. This was also applied today. -Continue dressing changes the right hallux as well. -Offloading pads were dispensed. -Monitor for any clinical signs or symptoms of infection and directed to call the office immediately should any occur or go to the ER. *xray left foot next appointment  Celesta Gentile, DPM

## 2015-09-14 NOTE — Patient Instructions (Signed)
Continue daily dressing changes with antibiotic ointment and a bandage  Continue offloading pads Monitor for any signs/symptoms of infection. Call the office immediately if any occur or go directly to the emergency room. Call with any questions/concerns.

## 2015-09-20 ENCOUNTER — Encounter: Payer: Self-pay | Admitting: Podiatry

## 2015-10-03 DIAGNOSIS — H60391 Other infective otitis externa, right ear: Secondary | ICD-10-CM | POA: Diagnosis not present

## 2015-10-05 ENCOUNTER — Ambulatory Visit: Payer: Medicare Other | Admitting: Podiatry

## 2015-10-08 ENCOUNTER — Encounter: Payer: Self-pay | Admitting: Podiatry

## 2015-10-08 ENCOUNTER — Ambulatory Visit (INDEPENDENT_AMBULATORY_CARE_PROVIDER_SITE_OTHER): Payer: Medicare Other

## 2015-10-08 ENCOUNTER — Ambulatory Visit (INDEPENDENT_AMBULATORY_CARE_PROVIDER_SITE_OTHER): Payer: Medicare Other | Admitting: Podiatry

## 2015-10-08 VITALS — BP 183/95 | HR 81 | Resp 18

## 2015-10-08 DIAGNOSIS — M2040 Other hammer toe(s) (acquired), unspecified foot: Secondary | ICD-10-CM | POA: Diagnosis not present

## 2015-10-08 DIAGNOSIS — L97529 Non-pressure chronic ulcer of other part of left foot with unspecified severity: Secondary | ICD-10-CM

## 2015-10-08 DIAGNOSIS — L84 Corns and callosities: Secondary | ICD-10-CM | POA: Diagnosis not present

## 2015-10-08 DIAGNOSIS — M2010 Hallux valgus (acquired), unspecified foot: Secondary | ICD-10-CM | POA: Diagnosis not present

## 2015-10-08 DIAGNOSIS — E11621 Type 2 diabetes mellitus with foot ulcer: Secondary | ICD-10-CM | POA: Diagnosis not present

## 2015-10-08 DIAGNOSIS — R52 Pain, unspecified: Secondary | ICD-10-CM | POA: Diagnosis not present

## 2015-10-08 NOTE — Progress Notes (Signed)
Patient ID: Joshua [name], male   DOB: 01-05-1951, 65 y.o.   MRN: [id number]  Subjective: 64 year old male presents the office they for follow-up evaluation of a wound the bottom of his left foot. He states that he continues sharp pain to the bottom of his left foot overlying the site of the ulceration/callus. He has resumed weightbearing and pressure although he does have pain in the area just sitting down. He denies any drainage or pus. Denies any redness or red streaks. He is requesting diabetic shoes at this time. No other complaints at this time. Denies any systemic complaints as fevers, chills, nausea, vomiting. No calf pain, chest pain, shortness of breath.  Objective: AAO 3, NAD DP/PT pulses palpable 2/4, CRT less than 3 seconds Protective sensation mildly decreased with Simms Weinstein monofilament (on last noted was dictated this intact although it was somewhat decreased as well). Left foot submetatarsal 4 hyperkeratotic lesions. After debridement the wound appears to be healed there is no underlying ulceration although it is pre-ulcerative. There is no surrounding erythema, ascending cellulitis, fluctuance, crepitus, malodor, drainage/purulence. There is a scab which appears to be peeling off of the right medial hallux along the site of the prior abrasion. There is no overlying edema, erythema, increase in warmth. No other open lesions or pre-ulcerative lesions. There is no pain with Compression, swelling, warmth, erythema. No other areas of tenderness to bilateral lower extremities. HAV and hammertoes present bilaterally.   Assessment: 64 year old male left second metatarsal 4 hyperkeratotic/pre-ulcerative lesion, healing abrasion right hallux  Plan: -Treatment options discussed including all alternatives, risks, and complications -Hyperkeratotic lesions are debrided without complications the left foot and to the right foot. Upon debridement no underlying ulceration, drainage or other  signs of infection. Offloading pads were dispensed. -Paperwork was completed for diabetic shoes today. -Follow-up if symptoms recur sooner if any palms are to arise. Discussed the importance of daily foot inspection is any changes to call the office. Call if questions or concerns.  Celesta Gentile, DPM

## 2015-10-19 DIAGNOSIS — E1142 Type 2 diabetes mellitus with diabetic polyneuropathy: Secondary | ICD-10-CM | POA: Insufficient documentation

## 2015-10-23 ENCOUNTER — Encounter: Payer: Self-pay | Admitting: Podiatry

## 2015-10-23 ENCOUNTER — Ambulatory Visit (INDEPENDENT_AMBULATORY_CARE_PROVIDER_SITE_OTHER): Payer: Medicare Other | Admitting: Podiatry

## 2015-10-23 VITALS — BP 152/82 | HR 82 | Resp 12

## 2015-10-23 DIAGNOSIS — L03115 Cellulitis of right lower limb: Secondary | ICD-10-CM

## 2015-10-23 DIAGNOSIS — E11621 Type 2 diabetes mellitus with foot ulcer: Secondary | ICD-10-CM | POA: Diagnosis not present

## 2015-10-23 DIAGNOSIS — R238 Other skin changes: Secondary | ICD-10-CM

## 2015-10-23 DIAGNOSIS — L988 Other specified disorders of the skin and subcutaneous tissue: Secondary | ICD-10-CM

## 2015-10-23 DIAGNOSIS — L97529 Non-pressure chronic ulcer of other part of left foot with unspecified severity: Secondary | ICD-10-CM

## 2015-10-23 NOTE — Progress Notes (Signed)
   Subjective:    Patient ID: Joshua [name], male    DOB: [birth year], 64 y.o.   MRN: [id number]  HPI 64 year old male presents the office with concerns of a blister to the right foot along the side of the first MTPJ which started last week. He does have a he did get his primary care physician and he was started on doxycycline. He's been applying antibiotic ointment over one area daily. The area was also drained. He does that he has continued to have some redness around the blister but denies any streaking or swelling. He also continues to state that he has sharp, stabbing pain to his left foot of the previous ulcer, callus. He would like to talk further about diabetic shoes. He denies any systemic complaints as fevers, chills, nausea, vomiting. No calf pain, chest pain, shortness of breath. No other complaints.  Review of Systems  Skin: Positive for color change and rash.       Objective:   Physical Exam AAO 3, NAD DP/PT pulses 2/4, CRT less than 3 seconds Protective sensation decreased with Simms Weinstein monofilament On the medial aspect of the right first metatarsal head there is a drained bulla present. ROM of erythema. There is a small amount of drainage expressed still from under the epidermal lysis from the bulla. Because of this the area was debrided. After debridement of the epidermal lysis the underlying skin was intact and there was only superficial Abrasion type wound. There is no probing, undermining or tunneling and this is very superficial on the very outer layers of the skin abrasion. There is no ascending cellulitis. There is faint edema around the area. Along the left foot submetatarsal around the area of the previous ulceration there is a hyperkeratotic lesion. Upon debridement underlying wound appears to be healed however there was thick hyperkeratotic tissue. Areas pre-ulcerative. No surrounding edema, erythema, drainage or other signs of infection. No other open lesions or  pre-ulcerative lesions. No pain with calf compression, swelling, warmth, erythema     Assessment & Plan:  64 year old male right first MPJ blister with localized infection; left submetatarsal 4 hyperkeratotic lesion and resolved ulceration -Treatment options discussed including all alternatives, risks, and complications -Etiology of symptoms were discussed. He states he was doing quite a bit of walking before this area started. -Epidermolysis was debrided to the right first metatarsal head overload on the bulla. Continue in about appointment. Continue doxycycline. Offloading pads. -Lesion left foot debrided without, complications/bleeding. -Awaiting diabetic shoes approval -Monitor for any clinical signs or symptoms of infection and directed to call the office immediately should any occur or go to the ER. -Follow-up in 1 week or sooner if any problems arise. In the meantime, encouraged to call the office with any questions, concerns, change in symptoms.   Celesta Gentile, DPM

## 2015-10-24 ENCOUNTER — Telehealth: Payer: Self-pay | Admitting: *Deleted

## 2015-10-24 MED ORDER — DOXYCYCLINE HYCLATE 100 MG PO TABS: 100.0000 mg | ORAL_TABLET | Freq: Two times a day (BID) | ORAL | Status: DC

## 2015-10-24 NOTE — Telephone Encounter (Signed)
Can refill doxycycline 100mg  BID x 7 days. Follow-up next week

## 2015-10-24 NOTE — Telephone Encounter (Addendum)
Pt states he saw Dr. Jacqualyn Posey yesterday, and was told that if he didn't get any better, to start the Doxycycline again.  Pt states the original rx was written by Dr. Virgina Jock.  Left message informing pt I had informed Dr. Jacqualyn Posey and was waiting his response.  Left message with Dr. Leigh Aurora orders.

## 2015-10-25 ENCOUNTER — Encounter: Payer: Self-pay | Admitting: Podiatry

## 2015-11-02 ENCOUNTER — Encounter: Payer: Self-pay | Admitting: Podiatry

## 2015-11-02 ENCOUNTER — Ambulatory Visit (INDEPENDENT_AMBULATORY_CARE_PROVIDER_SITE_OTHER): Payer: Medicare Other | Admitting: Podiatry

## 2015-11-02 VITALS — BP 138/78 | HR 88 | Resp 18

## 2015-11-02 DIAGNOSIS — L97529 Non-pressure chronic ulcer of other part of left foot with unspecified severity: Secondary | ICD-10-CM

## 2015-11-02 DIAGNOSIS — E11621 Type 2 diabetes mellitus with foot ulcer: Secondary | ICD-10-CM

## 2015-11-08 ENCOUNTER — Encounter: Payer: Self-pay | Admitting: Podiatry

## 2015-11-08 NOTE — Progress Notes (Signed)
Patient ID: Joshua [name], male   DOB: 1951-02-11, 64 y.o.   MRN: [id number]  Subjective: 64 year old male presents the office today for  Follow-up evaluation of the blistered along the medial right first MTPJ on the bunion. He states the areas doing better but he discontinued to have  A small wound overlying this area from where the blister was. He denies any drainage or pus. Denies  Any redness or red streaks. No malodor. He does continue to have hyperkeratotic lesion along the left foot submetatarsal 4. He states the pain does resolve after the calluses trimmed. Denies any redness or drainage or edema.  Objective: AAO 3, NAD DP/PT pulses 2/4, CRT less than 3 seconds Protective sensation decreased with Simms Weinstein monofilament On the medial aspect of the right first metatarsal head there is  A small superficial granular wound which does not probe to bone there is no undermining or tunneling. There is no swelling erythema, ascending  cellulitis, fluctuance, crepitus, malodor, drainage/purulence.  This is over the site of the  Bunion prominence. continuation hyperkeratotic lesion left foot submetatarsal 4. Upon debridement no underlying ulceration, drainage or other signs of infection. There is  Resolution of pain after debridement. No other open lesions or pre-ulcer lesions identified bilaterally. No pain with calf compression, swelling, warmth, erythema  Assessment:  64 year old male right superficial wound overlying recent blister, hypertrophic lesion left foot   Plan: -Treatment options discussed including all alternatives, risks, and complications -Hyperkeratotic lesion left foot debrided without complication/bleeding. -Wound right medial bunion debrided without complication/bleeding. Continued antibiotic ointment and a dressing daily. Offloading pads for now. -Awaiting diabetic shoes approval. -Monitor for any clinical signs or symptoms of infection and directed to call the office  immediately should any occur or go to the ER. -Follow-up as scheduled or sooner if any problems arise. In the meantime, encouraged to call the office with any questions, concerns, change in symptoms.   Celesta Gentile, DPM

## 2015-11-13 ENCOUNTER — Encounter: Payer: Self-pay | Admitting: Podiatry

## 2015-11-13 ENCOUNTER — Ambulatory Visit (INDEPENDENT_AMBULATORY_CARE_PROVIDER_SITE_OTHER): Payer: Medicare Other | Admitting: Podiatry

## 2015-11-13 DIAGNOSIS — L89891 Pressure ulcer of other site, stage 1: Secondary | ICD-10-CM

## 2015-11-13 DIAGNOSIS — L84 Corns and callosities: Secondary | ICD-10-CM

## 2015-11-13 DIAGNOSIS — L97529 Non-pressure chronic ulcer of other part of left foot with unspecified severity: Principal | ICD-10-CM

## 2015-11-13 DIAGNOSIS — E11621 Type 2 diabetes mellitus with foot ulcer: Secondary | ICD-10-CM

## 2015-11-13 NOTE — Progress Notes (Signed)
Patient ID: Joshua [name], male   DOB: [birth year], 63 y.o.   MRN: [id number]  Subjective: 64 year old male presents the office today for follow-up evaluation of  Wound to the side of the right foot along the MPJ along the bunion site. He does get some blood drainage, but no pus. No surrounding erythema or drainage. No malodor. He does continue to have hyperkeratotic lesion along the left foot submetatarsal 4. He states the pain does resolve after the calluses trimmed. Denies any redness or drainage or edema.  Objective: AAO 3, NAD DP/PT pulses 2/4, CRT less than 3 seconds Protective sensation decreased with Simms Weinstein monofilament On the medial aspect of the right first metatarsal head there is a small superficial granular wound which does not probe to bone there is no undermining or tunneling. The wound measures about 0.3 x 0.2 cm and is superficial. There is no surrounding erythema, ascending  cellulitis, fluctuance, crepitus, malodor, drainage/purulence. This is over the site of the  Bunion prominence. continuation hyperkeratotic lesion left foot submetatarsal 4. Upon debridement no underlying ulceration, drainage or other signs of infection. There is  Resolution of pain after debridement. No other open lesions or pre-ulcer lesions identified bilaterally. No pain with calf compression, swelling, warmth, erythema  Assessment:  64 year old male right superficial wound overlying recent blister, hypertrophic lesion left foot   Plan: -Treatment options discussed including all alternatives, risks, and complications -Hyperkeratotic lesion left foot debrided without complication/bleeding. -Wound right medial bunion debrided without complication/bleeding. Continued antibiotic ointment and a dressing daily. Offloading pads for now. -Awaiting diabetic shoes approval. -Monitor for any clinical signs or symptoms of infection and directed to call the office immediately should any occur or go to the  ER. -Follow-up as scheduled or sooner if any problems arise. In the meantime, encouraged to call the office with any questions, concerns, change in symptoms.   Celesta Gentile, DPM

## 2015-12-11 ENCOUNTER — Ambulatory Visit: Payer: Medicare Other | Admitting: Podiatry

## 2015-12-28 ENCOUNTER — Ambulatory Visit: Payer: Medicare Other | Admitting: Podiatry

## 2016-01-10 ENCOUNTER — Telehealth: Payer: Self-pay | Admitting: Podiatry

## 2016-01-10 NOTE — Telephone Encounter (Signed)
Left vm for pt. To call to schedule appt., pt is concerned about odor/bleeding from callus on left foot

## 2016-01-11 ENCOUNTER — Ambulatory Visit: Payer: Medicare Other | Admitting: Podiatry

## 2016-01-11 ENCOUNTER — Encounter: Payer: Self-pay | Admitting: Podiatry

## 2016-01-11 ENCOUNTER — Ambulatory Visit (INDEPENDENT_AMBULATORY_CARE_PROVIDER_SITE_OTHER): Payer: Medicare Other | Admitting: Podiatry

## 2016-01-11 VITALS — BP 158/72 | HR 69 | Resp 12

## 2016-01-11 DIAGNOSIS — L97529 Non-pressure chronic ulcer of other part of left foot with unspecified severity: Secondary | ICD-10-CM | POA: Diagnosis not present

## 2016-01-11 DIAGNOSIS — L84 Corns and callosities: Secondary | ICD-10-CM

## 2016-01-11 DIAGNOSIS — E11621 Type 2 diabetes mellitus with foot ulcer: Secondary | ICD-10-CM

## 2016-01-11 MED ORDER — CEPHALEXIN 500 MG PO CAPS: 500.0000 mg | ORAL_CAPSULE | Freq: Three times a day (TID) | ORAL | Status: DC

## 2016-01-14 ENCOUNTER — Encounter: Payer: Self-pay | Admitting: Podiatry

## 2016-01-14 DIAGNOSIS — L97529 Non-pressure chronic ulcer of other part of left foot with unspecified severity: Principal | ICD-10-CM

## 2016-01-14 DIAGNOSIS — E11621 Type 2 diabetes mellitus with foot ulcer: Secondary | ICD-10-CM | POA: Insufficient documentation

## 2016-01-14 NOTE — Progress Notes (Signed)
Patient ID: Joshua [name], male   DOB: July 14, 1951, 65 y.o.   MRN: [id number]  Subjective: 65 year old male presents the office today for a wound to the bottom of his left foot which started approximately 2 days ago. He states the wound started to have the smell to it as well and is been getting bigger. Denies any Cerner redness or red streaks. Denies any pus coming from the area. Unfortunately, Joshua [name] is cancels last 2 appointments and he has not been fitted for his diabetic shoes at this point either. Would can do that today as well. Denies any systemic complaints as fevers, chills, nausea, vomiting. No calf pain, chest pain, shortness of breath.  Objective: AAO 3, NAD DP/PT pulses 2/4, CRT less than 3 seconds Protective sensation decreased with Simms Weinstein monofilament On the plantar aspect of the left foot submetatarsal 5 is a hyperkeratotic lesion with what appears to be a old bulla underneath the lesion. Upon debridement there is no drainage or pus there was an underlying ulceration measuring 0.4 x 0.2 cm in the superficial that any probing, undermining or tunneling. There is no swelling erythema, ascending cellulitis, fluctuance, crepitus, malodor. There is also pre-ulcerative lesions present left foot and right foot submetatarsal. Upon debridement no underlying ulceration, drainage or other signs of infection. No other open lesions or pre-ulcer lesions identified. No pain with calf compression, swelling, warmth, erythema  Assessment: 65 year old male left foot ulceration, pre-ulcerative lesions 2   Plan: -Treatment options discussed including all alternatives, risks, and complications -Left foot ulcer debrided without complications. He was cleaned and Silvadene was applied followed by a dressing. Continue daily dressing changes at home. Surgical shoe and offloading pads were dispensed to help take pressure off the area. -Prescribed Keflex. -Other pre-ulcerative calluses were  debrided without, occasions her bleeding 2. -He is measured today for diabetic shoes. -Monitor for any clinical signs or symptoms of infection and directed to call the office immediately should any occur or go to the ER. -Follow-up as scheduled or sooner if any problems arise. In the meantime, encouraged to call the office with any questions, concerns, change in symptoms.   Celesta Gentile, DPM

## 2016-02-01 ENCOUNTER — Encounter: Payer: Self-pay | Admitting: Podiatry

## 2016-02-01 ENCOUNTER — Ambulatory Visit (INDEPENDENT_AMBULATORY_CARE_PROVIDER_SITE_OTHER): Payer: Medicare Other | Admitting: Podiatry

## 2016-02-01 VITALS — BP 162/90 | HR 94 | Resp 18

## 2016-02-01 DIAGNOSIS — L84 Corns and callosities: Secondary | ICD-10-CM

## 2016-02-01 DIAGNOSIS — L89891 Pressure ulcer of other site, stage 1: Secondary | ICD-10-CM | POA: Diagnosis not present

## 2016-02-01 DIAGNOSIS — L97529 Non-pressure chronic ulcer of other part of left foot with unspecified severity: Principal | ICD-10-CM

## 2016-02-01 DIAGNOSIS — E1149 Type 2 diabetes mellitus with other diabetic neurological complication: Secondary | ICD-10-CM

## 2016-02-01 DIAGNOSIS — E11621 Type 2 diabetes mellitus with foot ulcer: Secondary | ICD-10-CM

## 2016-02-04 ENCOUNTER — Encounter: Payer: Self-pay | Admitting: Podiatry

## 2016-02-04 NOTE — Progress Notes (Signed)
Patient ID: Joshua [name], male   DOB: Apr 05, 1951, 65 y.o.   MRN: [id number]  Subjective: 65 year old male presents the office today for follow-up evaluation of a wound to the bottom of his left foot. He states that since last appointment the area has gotten better and he is not having the pain and he was having previously. Denies any drainage or pus. Denies any redness or red streaks. He's been wearing the surgical shoe with offloading pads. Denies any malodor. Denies any systemic complaints as fevers, chills, nausea, vomiting. No calf pain, chest pain, shortness of breath. No other complaints at this time.   Objective: AAO 3, NAD DP/PT pulses 2/4, CRT less than 3 seconds Protective sensation decreased with Simms Weinstein monofilament On the plantar aspect of the left foot submetatarsal 5 is a hyperkeratotic lesion with an underlying ulceration upon debridement. Upon debridement to the wound measures 0.2 x 0.2 x 0.1 cm in the superficial. There is no probing, undermining or tunneling. There is no swelling erythema, ascending cellulitis, fluctuance, crepitus, malodor, drainage or pus.  Right foot submetatarsal one hyperkeratotic lesion. Upon debridement no underlying ulceration, drainage or other signs of infection. No other open lesions or pre-ulcerative lesions identified at this time. No pain with calf compression, swelling, warmth, erythema  Assessment: 65 year old male left foot ulceration, pre-ulcerative lesions 1   Plan: -Treatment options discussed including all alternatives, risks, and complications -Left foot ulcer debrided without complications. He was cleaned and Silvadene was applied followed by a dressing. Continue daily dressing changes at home. Surgical shoe and offloading pads were dispensed to help take pressure off the area. -Other pre-ulcerative calluses were debrided without complications or bleeding 1. -Monitor for any clinical signs or symptoms of infection and  directed to call the office immediately should any occur or go to the ER. -Still awaiting diabetic shoes/inserts -Follow-up in 2-3 weeks or sooner if any problems arise. In the meantime, encouraged to call the office with any questions, concerns, change in symptoms.   Celesta Gentile, DPM

## 2016-02-06 ENCOUNTER — Encounter: Payer: Medicare Other | Attending: Internal Medicine | Admitting: *Deleted

## 2016-02-06 VITALS — Ht 76.0 in | Wt 283.4 lb

## 2016-02-06 DIAGNOSIS — E669 Obesity, unspecified: Secondary | ICD-10-CM | POA: Insufficient documentation

## 2016-02-06 DIAGNOSIS — I10 Essential (primary) hypertension: Secondary | ICD-10-CM | POA: Diagnosis present

## 2016-02-06 DIAGNOSIS — E118 Type 2 diabetes mellitus with unspecified complications: Secondary | ICD-10-CM

## 2016-02-06 DIAGNOSIS — E1129 Type 2 diabetes mellitus with other diabetic kidney complication: Secondary | ICD-10-CM | POA: Insufficient documentation

## 2016-02-06 DIAGNOSIS — IMO0002 Reserved for concepts with insufficient information to code with codable children: Secondary | ICD-10-CM

## 2016-02-06 DIAGNOSIS — E1165 Type 2 diabetes mellitus with hyperglycemia: Secondary | ICD-10-CM

## 2016-02-06 NOTE — Patient Instructions (Signed)
Plan:  Aim for 4 Carb Choices per meal (60 grams) +/- 1 either way  Aim for 0-2 Carbs per snack if hungry  Include protein in moderation with your meals and snacks Consider reading food labels for Total Carbohydrate of foods Consider  increasing your activity level daily as tolerated Consider checking BG at alternate times per day  Continue taking medication  as directed by MD

## 2016-02-11 ENCOUNTER — Encounter: Payer: Self-pay | Admitting: Podiatry

## 2016-02-11 ENCOUNTER — Ambulatory Visit (INDEPENDENT_AMBULATORY_CARE_PROVIDER_SITE_OTHER): Payer: Medicare Other | Admitting: Podiatry

## 2016-02-11 VITALS — BP 129/73 | HR 91 | Resp 18

## 2016-02-11 DIAGNOSIS — M2040 Other hammer toe(s) (acquired), unspecified foot: Secondary | ICD-10-CM | POA: Diagnosis not present

## 2016-02-11 DIAGNOSIS — L84 Corns and callosities: Secondary | ICD-10-CM | POA: Diagnosis not present

## 2016-02-11 DIAGNOSIS — E1149 Type 2 diabetes mellitus with other diabetic neurological complication: Secondary | ICD-10-CM | POA: Diagnosis not present

## 2016-02-12 ENCOUNTER — Encounter: Payer: Self-pay | Admitting: Podiatry

## 2016-02-12 NOTE — Progress Notes (Signed)
Patient ID: Joshua [name], male   DOB: August 02, 1951, 65 y.o.   MRN: [id number]  Subjective: 65 year old male presents the office today for follow-up evaluation of a wound to the bottom of his left foot. He states that he is doing "wonderful". He denies any drainage or swelling or redness to his feet. Also presents a pickup diabetic shoes. Denies any systemic complaints as fevers, chills, nausea, vomiting. No calf pain, chest pain, shortness of breath. No other complaints at this time.   Objective: AAO 3, NAD DP/PT pulses 2/4, CRT less than 3 seconds Protective sensation decreased with Simms Weinstein monofilament On the plantar aspect of the left foot submetatarsal 5 is a hyperkeratotic lesion. Upon debridement there is no underlying ulceration, drainage or other signs of infection. Also pre-ulcerative lesion right foot submetatarsal one. Again after debridement no underlying ulceration, drainage or other signs of infection. There is plantarflexion the metatarsal heads and atrophy of the fat pad. Hammertoe contractures are present. No other open lesions or pre-ulcerative lesions. No pain with calf compression, swelling, warmth, erythema  Assessment: 65 year old male left foot ulceration, pre-ulcerative lesions 1   Plan: -Treatment options discussed including all alternatives, risks, and complications -Pre-ulcerative callus debrided 2 without complications or bleeding. -Continue to monitor for any further skin breakdown. -Monitor for any clinical signs or symptoms of infection and directed to call the office immediately should any occur or go to the ER. -Other shoes and 3 pairs of inserts were dispensed. Oral and written break in instructions were discussed. Monitor feet daily. -Follow-up in 4-6 weeks or sooner if any issues are to arise.  Celesta Gentile, DPM

## 2016-02-13 NOTE — Progress Notes (Signed)
Diabetes Self-Management Education  Visit Type: First/Initial  Appt. Start Time: 0900  Appt. End Time: 1030  02/13/2016  [name], identified by name and date of birth, is a 65 y.o. male with a diagnosis of Diabetes: Type 2.   ASSESSMENT  Height 6\' 4"  (1.93 m), weight 283 lb 6.4 oz (128.549 kg). Body mass index is 34.51 kg/(m^2).      Diabetes Self-Management Education - 02/06/16 0923    Visit Information   Visit Type First/Initial   Initial Visit   Diabetes Type Type 2   Are you currently following a meal plan? No   Are you taking your medications as prescribed? Yes   Date Diagnosed 15 years    Health Coping   How would you rate your overall health? Good   Psychosocial Assessment   Patient Belief/Attitude about Diabetes Motivated to manage diabetes   Self-management support Family;Doctor's office   Other persons present Patient;Spouse/SO   Patient Concerns Nutrition/Meal planning;Glycemic Control   Preferred Learning Style No preference indicated   How often do you need to have someone help you when you read instructions, pamphlets, or other written materials from your doctor or pharmacy? 1 - Never   What is the last grade level you completed in school? bachelor's degree in education   Complications   Last HgB A1C per patient/outside source 8.7 %   How often do you check your blood sugar? 1-2 times/day   Have you had a dilated eye exam in the past 12 months? Yes   Have you had a dental exam in the past 12 months? Yes   Are you checking your feet? Yes   How many days per week are you checking your feet? 7   Dietary Intake   Breakfast 2 cups oatmeal, almond milk, blueberries   Snack (morning) hard boiled egg occasionally   Lunch at home; sandwich OR left overs OR tuna salad with olives and peppers and tomatoes and crackers OR in restaurant; BBQ sandwich or shared meal with wife   Snack (afternoon) hummus and carrots or crackers OR apples with PNB OR PNB crackers  OR banana OR canned fruit   Dinner lean meat, whole grain starch, vegetables, bread   Snack (evening) hard candy along with fresh fruit, occasionally dessert   Beverage(s) water, black coffee, unsweet tea   Exercise   Exercise Type Light (walking / raking leaves)   How many days per week to you exercise? 3   How many minutes per day do you exercise? 45   Total minutes per week of exercise 135   Patient Education   Previous Diabetes Education Yes (please comment)  15 years ago   Disease state  Definition of diabetes, type 1 and 2, and the diagnosis of diabetes   Nutrition management  Role of diet in the treatment of diabetes and the relationship between the three main macronutrients and blood glucose level;Food label reading, portion sizes and measuring food.;Carbohydrate counting   Physical activity and exercise  Role of exercise on diabetes management, blood pressure control and cardiac health.   Medications Reviewed patients medication for diabetes, action, purpose, timing of dose and side effects.   Monitoring Purpose and frequency of SMBG.;Identified appropriate SMBG and/or A1C goals.   Acute complications Taught treatment of hypoglycemia - the 15 rule.   Chronic complications Relationship between chronic complications and blood glucose control   Psychosocial adjustment Role of stress on diabetes   Individualized Goals (developed by patient)   Nutrition  Follow meal plan discussed   Physical Activity Exercise 3-5 times per week   Medications take my medication as prescribed   Monitoring  test blood glucose pre and post meals as discussed   Reducing Risk examine blood glucose patterns   Outcomes   Expected Outcomes Demonstrated interest in learning. Expect positive outcomes   Future DMSE 3-4 months   Program Status Completed      Individualized Plan for Diabetes Self-Management Training:   Learning Objective:  Patient will have a greater understanding of diabetes  self-management. Patient education plan is to attend individual and/or group sessions per assessed needs and concerns.   Plan:   Patient Instructions  Plan:  Aim for 4 Carb Choices per meal (60 grams) +/- 1 either way  Aim for 0-2 Carbs per snack if hungry  Include protein in moderation with your meals and snacks Consider reading food labels for Total Carbohydrate of foods Consider  increasing your activity level daily as tolerated Consider checking BG at alternate times per day  Continue taking medication  as directed by MD       Expected Outcomes:  Demonstrated interest in learning. Expect positive outcomes  Education material provided: Living Well with Diabetes, A1C conversion sheet, Meal plan card and Carbohydrate counting sheet  If problems or questions, patient to contact team via:  Phone and Email  Future DSME appointment: 3-4 months

## 2016-02-22 ENCOUNTER — Ambulatory Visit: Payer: Medicare Other | Admitting: Podiatry

## 2016-02-22 ENCOUNTER — Encounter: Payer: Self-pay | Admitting: Internal Medicine

## 2016-03-04 ENCOUNTER — Telehealth: Payer: Self-pay | Admitting: *Deleted

## 2016-03-04 NOTE — Telephone Encounter (Signed)
Tammy states that she called the pt and offered him an appt today in [place] but he said he already had made one for [facility] with Dr. Jacqualyn Posey tomorrow.

## 2016-03-04 NOTE — Telephone Encounter (Signed)
He was offered to be seen today but patient apparently stated he did not want to come to [place] today. Will be seen tomorrow in [facility].

## 2016-03-04 NOTE — Telephone Encounter (Signed)
Patient called states his foot is bleeding and a foul odor coming from the wound. Requesting to be seen ASAP.

## 2016-03-05 ENCOUNTER — Ambulatory Visit (INDEPENDENT_AMBULATORY_CARE_PROVIDER_SITE_OTHER): Payer: Medicare Other | Admitting: Podiatry

## 2016-03-05 ENCOUNTER — Ambulatory Visit (HOSPITAL_BASED_OUTPATIENT_CLINIC_OR_DEPARTMENT_OTHER)
Admission: RE | Admit: 2016-03-05 | Discharge: 2016-03-05 | Disposition: A | Discharge status: Home / Self Care | Payer: Medicare Other | Source: Ambulatory Visit | Attending: Podiatry | Admitting: Podiatry

## 2016-03-05 ENCOUNTER — Encounter: Payer: Self-pay | Admitting: Podiatry

## 2016-03-05 VITALS — BP 121/73 | HR 95 | Resp 18

## 2016-03-05 DIAGNOSIS — R52 Pain, unspecified: Secondary | ICD-10-CM

## 2016-03-05 DIAGNOSIS — L97529 Non-pressure chronic ulcer of other part of left foot with unspecified severity: Secondary | ICD-10-CM | POA: Diagnosis not present

## 2016-03-05 DIAGNOSIS — Q828 Other specified congenital malformations of skin: Secondary | ICD-10-CM

## 2016-03-05 DIAGNOSIS — M79672 Pain in left foot: Secondary | ICD-10-CM | POA: Diagnosis not present

## 2016-03-05 DIAGNOSIS — L84 Corns and callosities: Secondary | ICD-10-CM

## 2016-03-05 DIAGNOSIS — E11621 Type 2 diabetes mellitus with foot ulcer: Secondary | ICD-10-CM | POA: Diagnosis not present

## 2016-03-05 DIAGNOSIS — L03116 Cellulitis of left lower limb: Secondary | ICD-10-CM | POA: Diagnosis not present

## 2016-03-05 DIAGNOSIS — E1149 Type 2 diabetes mellitus with other diabetic neurological complication: Secondary | ICD-10-CM | POA: Diagnosis not present

## 2016-03-05 MED ORDER — AMOXICILLIN-POT CLAVULANATE 875-125 MG PO TABS
1.0000 | ORAL_TABLET | Freq: Two times a day (BID) | ORAL | Status: DC
Start: 2016-03-05 — End: 2016-04-28

## 2016-03-05 NOTE — Patient Instructions (Signed)
Monitor for any signs/symptoms of infection. Call the office immediately if any occur or go directly to the emergency room. Call with any questions/concerns.  

## 2016-03-07 ENCOUNTER — Encounter: Payer: Self-pay | Admitting: Podiatry

## 2016-03-07 DIAGNOSIS — L03116 Cellulitis of left lower limb: Secondary | ICD-10-CM | POA: Insufficient documentation

## 2016-03-07 DIAGNOSIS — L84 Corns and callosities: Secondary | ICD-10-CM | POA: Insufficient documentation

## 2016-03-07 NOTE — Progress Notes (Signed)
Patient ID: Joshua [name], male   DOB: Dec 05, 1950, 65 y.o.   MRN: [id number]  Subjective: 65 year old male presents the office today for concerns of the left foot wound has been bleeding he is started to notice a smell to the wound as well. This is been ongoing the last couple of days. He is often appointment to see me yesterday however he declined. He has is noted on the right foot he had a small amount of bleeding from where he had thick piece of skin that got peeled off. He has noticed some redness the top of his left foot but has not noticed any warmth. There is been no drainage or pus that he is noted. He is, not taking any antibiotics. Denies any systemic complaints such as fevers, chills, nausea, vomiting. No acute changes since last appointment, and no other complaints at this time.   Objective: AAO x3, NAD DP/PT pulses palpable bilaterally, CRT less than 3 seconds Protective sensation decreased with Simms Weinstein monofilament On the plantar aspect the left foot submetatarsal 4/5 area of the hyperkeratotic lesion. Upon debridement there was a wound measuring 1 x 0.5 cm with a depth of about 0.27 m. There is no probing to bone, undermining or tunneling. There is no swelling erythema on the plantar aspect of the foot however on the dorsal aspect of the foot there is a localized area of erythema and warmth. There is no ascending cellulitis. There is no fluctuance or crepitus. There is no pus. There is mild malodor. On the right foot submetatarsal 4 area of the hyperkeratotic lesion which appears to have been somewhat peeled off. Upon debridement is a superficial wound for almost an abrasion where the callouses come off. There is no surrounding erythema, streaking. No malodor. No areas of pinpoint bony tenderness or pain with vibratory sensation. MMT 5/5, ROM WNL. No edema, erythema, increase in warmth to bilateral lower extremities.  No open lesions or pre-ulcerative lesions.  No pain with calf  compression, swelling, warmth, erythema  Assessment: Left foot ulceration with cellulitis, pre-ulcerative calluses right foot  Plan: -All treatment options discussed with the patient including all alternatives, risks, complications.  -Wound on the left foot was debrided to granular tissue. Recommend Silvadene dressing changes daily. He ready has this at home. Will start Augmentin for infection. If there is any worsening signs or symptoms of infection he is to go immediately to the emergency room. Currently has no systemic complaints. Offloading. -Lesions on the right foot were debrided without complications or bleeding. A is superficial wound underneath the right submetatarsal 4 area where the callus is been peeled off. No signs of infection. Continue offloading. -. He has an appointment scheduled will follow-up then or sooner if any issues are to arise. -Patient encouraged to call the office with any questions, concerns, change in symptoms.   Celesta Gentile, DPM

## 2016-03-17 ENCOUNTER — Encounter: Payer: Self-pay | Admitting: Podiatry

## 2016-03-17 ENCOUNTER — Ambulatory Visit (INDEPENDENT_AMBULATORY_CARE_PROVIDER_SITE_OTHER): Payer: Medicare Other | Admitting: Podiatry

## 2016-03-17 VITALS — BP 140/79 | HR 82 | Resp 18

## 2016-03-17 DIAGNOSIS — L97529 Non-pressure chronic ulcer of other part of left foot with unspecified severity: Secondary | ICD-10-CM

## 2016-03-17 DIAGNOSIS — E11621 Type 2 diabetes mellitus with foot ulcer: Secondary | ICD-10-CM

## 2016-03-17 DIAGNOSIS — E1149 Type 2 diabetes mellitus with other diabetic neurological complication: Secondary | ICD-10-CM

## 2016-03-18 ENCOUNTER — Encounter: Payer: Self-pay | Admitting: Podiatry

## 2016-03-18 NOTE — Progress Notes (Signed)
Patient ID: Joshua [name], male   DOB: [birth year], 65 y.o.   MRN: [id number]  Subjective: 65 year old male presents the office today for follow-up evaluation of wound the ball of the left foot. He states the area is somewhat better compared to last appointment. Still taken antibiotics. His menisci drainage or pus. No redness or swelling to his foot. Is not noticing any malodor. He does get some occasional discomfort to the area but this has improved. Denies any systemic complaints such as fevers, chills, nausea, vomiting. No acute changes since last appointment, and no other complaints at this time.   Objective: AAO x3, NAD DP/PT pulses palpable bilaterally, CRT less than 3 seconds Protective sensation decreased with Simms Weinstein monofilament Hyperkeratotic lesion present left foot submetatarsal 4. Upon debridement there is a linear ulceration measuring approximate 0.8 x 0.2 cm in a superficial granular wound base. There is no probing, undermining or tunneling. There is no surrounding erythema, ascending cellulitis, fluctuance, crepitus, malodor, drainage or pus. No other open lesions of the left foot. Lesions right foot unchanged and are not to open this time. No pain with calf compression, swelling, warmth, erythema  Assessment: Left foot ulceration with resolving cellulitis  Plan: -All treatment options discussed with the patient including all alternatives, risks, complications.  -Wound on the plantar aspect left foot debrided without complications. There is no drainage or pus or other signs of infection. The cellulitis appears to be improving. Finish course of antibiotics. Monitor for any worsening of symptoms and if they do he is to go immediately to the emergency room. -Recommended offloading. He did not have diabetic shoes with inserts today and recommended him to wear the next appointment and I will hopefully try to modify them to help take more pressure off the area. -Follow-up as  scheduled  Celesta Gentile, DPM

## 2016-04-07 ENCOUNTER — Ambulatory Visit: Payer: Medicare Other | Admitting: Podiatry

## 2016-04-28 ENCOUNTER — Ambulatory Visit (INDEPENDENT_AMBULATORY_CARE_PROVIDER_SITE_OTHER): Payer: Medicare Other

## 2016-04-28 ENCOUNTER — Encounter: Payer: Self-pay | Admitting: Podiatry

## 2016-04-28 ENCOUNTER — Ambulatory Visit (INDEPENDENT_AMBULATORY_CARE_PROVIDER_SITE_OTHER): Payer: Medicare Other | Admitting: Podiatry

## 2016-04-28 VITALS — BP 131/68 | HR 90 | Temp 99.0°F | Resp 18

## 2016-04-28 DIAGNOSIS — R52 Pain, unspecified: Secondary | ICD-10-CM

## 2016-04-28 DIAGNOSIS — E11621 Type 2 diabetes mellitus with foot ulcer: Secondary | ICD-10-CM

## 2016-04-28 DIAGNOSIS — L97529 Non-pressure chronic ulcer of other part of left foot with unspecified severity: Secondary | ICD-10-CM

## 2016-04-28 DIAGNOSIS — L97511 Non-pressure chronic ulcer of other part of right foot limited to breakdown of skin: Secondary | ICD-10-CM

## 2016-04-28 MED ORDER — AMOXICILLIN-POT CLAVULANATE 875-125 MG PO TABS: 1.0000 | ORAL_TABLET | Freq: Two times a day (BID) | ORAL | Status: DC

## 2016-05-02 ENCOUNTER — Encounter: Payer: Self-pay | Admitting: Podiatry

## 2016-05-02 NOTE — Progress Notes (Signed)
Patient ID: Joshua [name], male   DOB: 1951/04/25, 65 y.o.   MRN: [id number]  Subjective: 65 year old male presents the office today for follow-up evaluation of wound the ball of the left foot. He states that the left foot is not doing any better and he has noticed a spot for malaise right side as well. Denies any drainage or pus other than small on the blood. No redness or swelling or red streaks. He did bring in his diabetic shoes for evaluation today. Denies any systemic complaints such as fevers, chills, nausea, vomiting. No acute changes since last appointment, and no other complaints at this time.   Objective: AAO x3, NAD DP/PT pulses palpable bilaterally, CRT less than 3 seconds Protective sensation decreased with Simms Weinstein monofilament Hyperkeratotic lesion present left foot submetatarsal 4 with underlying ulceration. There is no probing, undermining or tunneling. There is no surrounding erythema, ascending cellulitis, fluctuance, crepitus, malodor. Wound measures of proximal 0.6 x 0.4 cm. Hyperkeratotic periwound prior to debridement Right foot submetatarsal 5 small greater wound measuring 0.3 x 0.3 x 0 point once in ears. There is no probing, undermining or tunneling. No skin redness or drainage. No pus. No malodor. No pain with calf compression, swelling, warmth, erythema  Assessment: Bilateral foot ulceration   Plan: -All treatment options discussed with the patient including all alternatives, risks, complications.  -X-rays were obtained and reviewed with the patient. It is acute fracture. No evidence of acute osteomyelitis. No soft tissue emphysema. -Debrided without complications to granular, healthy wound base with a scalpel. Continue daily dressing changes with Silvadene. -Betha (pedorthitis) also evaluated the patient and his inserts and discussed with them ways to tie his shoes as well as offloading more properly. -Monitor for any clinical signs or symptoms of infection  and directed to call the office immediately should any occur or go to the ER. -Recommended offloading. He did not have diabetic shoes with inserts today and recommended him to wear the next appointment and I will hopefully try to modify them to help take more pressure off the area. -Follow-up as scheduled  Celesta Gentile, DPM

## 2016-05-12 ENCOUNTER — Telehealth: Payer: Self-pay | Admitting: *Deleted

## 2016-05-12 ENCOUNTER — Ambulatory Visit (INDEPENDENT_AMBULATORY_CARE_PROVIDER_SITE_OTHER): Payer: Medicare Other | Admitting: Podiatry

## 2016-05-12 DIAGNOSIS — Z01812 Encounter for preprocedural laboratory examination: Secondary | ICD-10-CM

## 2016-05-12 DIAGNOSIS — L97529 Non-pressure chronic ulcer of other part of left foot with unspecified severity: Secondary | ICD-10-CM

## 2016-05-12 DIAGNOSIS — E11621 Type 2 diabetes mellitus with foot ulcer: Secondary | ICD-10-CM

## 2016-05-12 DIAGNOSIS — L97511 Non-pressure chronic ulcer of other part of right foot limited to breakdown of skin: Secondary | ICD-10-CM

## 2016-05-12 NOTE — Telephone Encounter (Addendum)
-----   Message from Trula Slade, DPM sent at 05/12/2016  3:52 PM EDT ----- Can you please order a MRI of the left foot for chronic foot ulcer to rule out abscess or osteomyelitis. Orders to [facility] for pre-cert. [id number] [employer] DOES NOT REQUIRE PRIOR AUTHORIZATION FOR MRI LEFT FOOT WITH AND WITHOUT CONTRAST.  FAXED TO [facility]. 05/22/2016-Left message for pt to call for MRI results.

## 2016-05-12 NOTE — Progress Notes (Signed)
Patient ID: Joshua [name], male   DOB: [birth year], 65 y.o.   MRN: [id number]  Subjective: 65 year old male presents the office today for follow-up evaluation of wound the ball of the left foot. He states he is getting frustrated with his wound is not closing. He states it continues to bleed is a lot of pressure to his foot denies any pus. Denies any redness or warmth to his foot denies any swelling. States that the wound on the right foot is doing well. Denies any systemic complaints such as fevers, chills, nausea, vomiting. No acute changes since last appointment, and no other complaints at this time.   Objective: AAO x3, NAD DP/PT pulses palpable bilaterally, CRT less than 3 seconds Protective sensation decreased with Simms Weinstein monofilament Hyperkeratotic lesion present left foot submetatarsal 4 with underlying ulceration. There is no probing, undermining or tunneling. The wound does probe somewhat deeper today however does not probe to bone. There is no surrounding erythema, ascending cellulitis, fluctuance, crepitus, malodor. Wound measures of proximal 0.6 x 0.4 cm, remains about unchanged in size. Hyperkeratotic periwound prior to debridement.  Right foot submetatarsal 5 ulcer with a pinpoint opening. There is no probing, undermining or tunneling. No swelling or redness or drainage. No fluctuance or crepitus. No malodor  No pain with calf compression, swelling, warmth, erythema  Assessment: Bilateral foot ulceration with some evidence of healing on the right and unchanged left however no clinical signs of infection  Plan: -All treatment options discussed with the patient including all alternatives, risks, complications.  -At this point given the nonhealing of the ulcer directly MRI to rule out abscess or osteomyelitis. -The wound on the left side was debrided today without complications to greater helping tissue. Continue with daily dressing changes with antibiotic ointment for now.  Continue strict offloading. He has been wearing a regular shoe. Recommend he go back into the surgical shoe and I made pads to apply to the surgical shoe only gets home to help take more pressure off of the foot. -Monitor for any clinical signs or symptoms of infection and directed to call the office immediately should any occur or go to the ER. -Follow-up as scheduled  Celesta Gentile, DPM

## 2016-05-21 ENCOUNTER — Ambulatory Visit
Admission: RE | Admit: 2016-05-21 | Discharge: 2016-05-21 | Disposition: A | Discharge status: Home / Self Care | Payer: Medicare Other | Source: Ambulatory Visit | Attending: Podiatry | Admitting: Podiatry

## 2016-05-21 DIAGNOSIS — E11621 Type 2 diabetes mellitus with foot ulcer: Secondary | ICD-10-CM

## 2016-05-21 DIAGNOSIS — L97511 Non-pressure chronic ulcer of other part of right foot limited to breakdown of skin: Secondary | ICD-10-CM

## 2016-05-21 DIAGNOSIS — L97529 Non-pressure chronic ulcer of other part of left foot with unspecified severity: Principal | ICD-10-CM

## 2016-05-21 MED ORDER — GADOBENATE DIMEGLUMINE 529 MG/ML IV SOLN
20.0000 mL | Freq: Once | INTRAVENOUS | Status: AC | PRN
  Administered 2016-05-21: 20 mL via INTRAVENOUS

## 2016-05-22 NOTE — Telephone Encounter (Signed)
-----   Message from Trula Slade, DPM sent at 05/22/2016 10:46 AM EDT ----- Please let him know that the MRI is negative for any abscess or osteomyelitis. If wound is not healing, likely refer to the wound care center (Dr. Con Memos)

## 2016-05-26 DIAGNOSIS — K5900 Constipation, unspecified: Secondary | ICD-10-CM | POA: Insufficient documentation

## 2016-05-26 DIAGNOSIS — I8390 Asymptomatic varicose veins of unspecified lower extremity: Secondary | ICD-10-CM | POA: Insufficient documentation

## 2016-06-04 ENCOUNTER — Ambulatory Visit: Payer: Medicare Other | Admitting: *Deleted

## 2016-06-06 ENCOUNTER — Ambulatory Visit (INDEPENDENT_AMBULATORY_CARE_PROVIDER_SITE_OTHER): Payer: Medicare Other | Admitting: Podiatry

## 2016-06-06 ENCOUNTER — Encounter: Payer: Self-pay | Admitting: Podiatry

## 2016-06-06 DIAGNOSIS — L97511 Non-pressure chronic ulcer of other part of right foot limited to breakdown of skin: Secondary | ICD-10-CM

## 2016-06-06 DIAGNOSIS — L97529 Non-pressure chronic ulcer of other part of left foot with unspecified severity: Secondary | ICD-10-CM

## 2016-06-06 DIAGNOSIS — E11621 Type 2 diabetes mellitus with foot ulcer: Secondary | ICD-10-CM | POA: Diagnosis not present

## 2016-06-12 ENCOUNTER — Ambulatory Visit: Payer: Medicare Other | Admitting: *Deleted

## 2016-06-16 NOTE — Progress Notes (Signed)
Patient ID: Joshua [name], male   DOB: 08-11-1951, 65 y.o.   MRN: [id number]  Subjective:  65 year old male presents the office today for follow-up evaluation of wound the ball of the left foot. Overall he seemed to the wound is doing the same. Denies any drainage or pus. He denies any surrounding redness or red streaks or any swelling. At this point states his been frustrated with the wound. Denies he systemic complaints as fevers, chills, nausea, vomiting. Denies any calf pain, chest pain, soreness of breath. No other complaints at this time and a new concerns.  Objective: AAO x3, NAD DP/PT pulses palpable bilaterally, CRT less than 3 seconds Protective sensation decreased with Simms Weinstein monofilament Hyperkeratotic lesion present left foot submetatarsal 4 with underlying ulceration. There is no probing, undermining or tunneling. Wound does appear to be granular. It is not as deep as prescribed last appointment however does remain the same diameter. On the right foot symmetrical 5 hyperkeratotic lesion with underlying ulceration which does not probe to bone there is no undermining or tunneling. There is no swelling edema, erythema, ascending cellulitis. Questions or crepitus. No malodor. No other open lesions or pre-ulcer lesions identified at this time. No pain with calf compression, swelling, warmth, erythema  Assessment: Bilateral foot ulceration   Plan: -All treatment options discussed with the patient including all alternatives, risks, complications.  -MRI results are discussed the patient. This point he is feeling improvement in the wound and I recommended him to be evaluated by the wound care center. A referral was placed for this. I'll see him back in 3-4 weeks if he does not get a see the wound care center for encouraged to call sooner any questions or concerns. If any signs or symptoms of infection to the ER.  Celesta Gentile, DPM

## 2016-06-25 ENCOUNTER — Encounter (HOSPITAL_BASED_OUTPATIENT_CLINIC_OR_DEPARTMENT_OTHER): Payer: Medicare Other | Attending: Surgery

## 2016-06-25 DIAGNOSIS — L97522 Non-pressure chronic ulcer of other part of left foot with fat layer exposed: Secondary | ICD-10-CM | POA: Insufficient documentation

## 2016-06-25 DIAGNOSIS — Z6833 Body mass index (BMI) 33.0-33.9, adult: Secondary | ICD-10-CM | POA: Insufficient documentation

## 2016-06-25 DIAGNOSIS — Z794 Long term (current) use of insulin: Secondary | ICD-10-CM | POA: Diagnosis not present

## 2016-06-25 DIAGNOSIS — Z79899 Other long term (current) drug therapy: Secondary | ICD-10-CM | POA: Diagnosis not present

## 2016-06-25 DIAGNOSIS — Z8673 Personal history of transient ischemic attack (TIA), and cerebral infarction without residual deficits: Secondary | ICD-10-CM | POA: Diagnosis not present

## 2016-06-25 DIAGNOSIS — Z85820 Personal history of malignant melanoma of skin: Secondary | ICD-10-CM | POA: Insufficient documentation

## 2016-06-25 DIAGNOSIS — E11621 Type 2 diabetes mellitus with foot ulcer: Secondary | ICD-10-CM | POA: Insufficient documentation

## 2016-06-25 DIAGNOSIS — E1140 Type 2 diabetes mellitus with diabetic neuropathy, unspecified: Secondary | ICD-10-CM | POA: Diagnosis not present

## 2016-06-25 DIAGNOSIS — I10 Essential (primary) hypertension: Secondary | ICD-10-CM | POA: Insufficient documentation

## 2016-06-25 DIAGNOSIS — Z7902 Long term (current) use of antithrombotics/antiplatelets: Secondary | ICD-10-CM | POA: Insufficient documentation

## 2016-06-25 DIAGNOSIS — L97512 Non-pressure chronic ulcer of other part of right foot with fat layer exposed: Secondary | ICD-10-CM | POA: Insufficient documentation

## 2016-06-25 DIAGNOSIS — Z87891 Personal history of nicotine dependence: Secondary | ICD-10-CM | POA: Insufficient documentation

## 2016-06-27 ENCOUNTER — Ambulatory Visit: Payer: Medicare Other | Admitting: Podiatry

## 2016-07-02 DIAGNOSIS — E11621 Type 2 diabetes mellitus with foot ulcer: Secondary | ICD-10-CM | POA: Diagnosis not present

## 2016-07-04 DIAGNOSIS — E11621 Type 2 diabetes mellitus with foot ulcer: Secondary | ICD-10-CM | POA: Diagnosis not present

## 2016-07-08 DIAGNOSIS — E11621 Type 2 diabetes mellitus with foot ulcer: Secondary | ICD-10-CM | POA: Diagnosis not present

## 2016-07-16 DIAGNOSIS — E11621 Type 2 diabetes mellitus with foot ulcer: Secondary | ICD-10-CM | POA: Diagnosis not present

## 2016-07-18 DIAGNOSIS — E11621 Type 2 diabetes mellitus with foot ulcer: Secondary | ICD-10-CM | POA: Diagnosis not present

## 2016-07-23 DIAGNOSIS — E11621 Type 2 diabetes mellitus with foot ulcer: Secondary | ICD-10-CM | POA: Diagnosis not present

## 2016-07-29 DIAGNOSIS — R9431 Abnormal electrocardiogram [ECG] [EKG]: Secondary | ICD-10-CM | POA: Insufficient documentation

## 2016-07-30 ENCOUNTER — Encounter (HOSPITAL_BASED_OUTPATIENT_CLINIC_OR_DEPARTMENT_OTHER): Payer: Medicare Other | Attending: Surgery

## 2016-07-30 DIAGNOSIS — Z8631 Personal history of diabetic foot ulcer: Secondary | ICD-10-CM | POA: Diagnosis not present

## 2016-07-30 DIAGNOSIS — Z8673 Personal history of transient ischemic attack (TIA), and cerebral infarction without residual deficits: Secondary | ICD-10-CM | POA: Insufficient documentation

## 2016-07-30 DIAGNOSIS — E1140 Type 2 diabetes mellitus with diabetic neuropathy, unspecified: Secondary | ICD-10-CM | POA: Diagnosis not present

## 2016-07-30 DIAGNOSIS — I10 Essential (primary) hypertension: Secondary | ICD-10-CM | POA: Insufficient documentation

## 2016-07-30 DIAGNOSIS — Z09 Encounter for follow-up examination after completed treatment for conditions other than malignant neoplasm: Secondary | ICD-10-CM | POA: Insufficient documentation

## 2016-08-13 ENCOUNTER — Encounter: Payer: Self-pay | Admitting: Adult Health

## 2016-08-13 ENCOUNTER — Ambulatory Visit (INDEPENDENT_AMBULATORY_CARE_PROVIDER_SITE_OTHER): Payer: Medicare Other | Admitting: Adult Health

## 2016-08-13 VITALS — BP 140/82 | HR 68 | Resp 20 | Ht 76.0 in | Wt 271.0 lb

## 2016-08-13 DIAGNOSIS — G250 Essential tremor: Secondary | ICD-10-CM | POA: Diagnosis not present

## 2016-08-13 DIAGNOSIS — R42 Dizziness and giddiness: Secondary | ICD-10-CM | POA: Diagnosis not present

## 2016-08-13 NOTE — Progress Notes (Signed)
PATIENT: Joshua [name] DOB: May 20, 1951  REASON FOR VISIT: follow up- tremor, vertigo HISTORY FROM: patient  HISTORY OF PRESENT ILLNESS:  Joshua [name] is a 65 year old male with a history of benign essential tremor and dizziness. He returns today for follow-up. The patient reports that he has remained stable. He denies any episodes of vertigo. He states that he continues to have tremor in both hands. He did participate and occupational therapy and found the weighted  pens beneficial. He states that the weighted utensils did not help much. He states he is able to complete all ADLs independently. At this time he does not want to try any medication. He returns today for an evaluation.  HISTORY 08/14/15:Joshua [name] is a 65 year old male with a history of benign essential tremor and dizziness. He returns today for follow-up. The patient states that he has not had a true episode of vertigo in over a year and a half. He states that he continues to have the tremor in both hands. He is not sure if one side is worse than other at this point. He states that he notices that when he is trying to button a shirt, when he eats and with his handwriting. He is a patient at the [facility] hospital. They recommended a referral to occupational therapy to see if the patient gets any benefit from using weighted utensils and weighted pens. At this time the patient does not want to try medication. He returns today for an evaluation.   HISTORY  Joshua [name] is a 65 year old male with a history of benign essential tremor and dizziness. He returns today for follow-up. The patient states that his dizziness did improve once he stopped the melatonin. He states now he will experience dizziness if he changes position suddenly such as going from a sitting to a standing position. He also has dizziness if he turns over in bed specifically to the left too quickly. He describes this dizziness as true vertigo. He will experience nausea with this. He  states that if he has a severe dizzy spell usually during the night he will wake up with a headache on the right side, nausea and that usually followed by a loose bowel movement. He states as a child he suffered from severe motion sickness. The patient states that his tremor has remained the same. His tremor is located in both hands right greater than left. He states that he is been able to make adjustments to cope with the tremor. He states that at this time he does not feel that he will need medication. He returns today for an evaluation.  HISTORY 12/13/14: Joshua [name] is a 65 year old male with a history of benign essential tremor and benign positional vertigo. The patient was last seen in 2014 by Dr. Jannifer Franklin. The patient was put on Mysoline for the tremor and had received good benefit from vestibular rehabilitation for the vertigo. He returns today stating he has started having vertigo again. He states that he tried some of the exercises that he was taught in rehab but it has not helped. He states the dizziness has gone on for 3 weeks. It is accompanied with a dull headache and nausea. He states that he feels that his balance has been affected. He does not make any sudden movements- tries to move and stand very slowly. He notices that the dizziness occurs with positional changes. Can occur with head turning or going from a sitting to a standing position. He states the episodes of  vertigo are not as severe as they had been in the past. The patient states that the only new medication that he has started is melatonin. He states that now that I mention it, the dizziness started right when melatonin was started. The patient also has an essential tremor that affects primarily the right hand. He states that he stopped taking Mysoline over a year ago. He states that he went to get a refill but needed a follow-up appointment. However he is unsure that he noticed a benefit with the Mysoline.    REVIEW OF SYSTEMS: Out of  a complete 14 system review of symptoms, the patient complains only of the following symptoms, and all other reviewed systems are negative.  Apnea, joint pain, tremors, hearing loss  ALLERGIES: No Known Allergies  HOME MEDICATIONS: Outpatient Medications Prior to Visit  Medication Sig Dispense Refill  . amoxicillin-clavulanate (AUGMENTIN) 875-125 MG tablet Take 1 tablet by mouth 2 (two) times daily. 20 tablet 0  . aspirin 81 MG tablet Take 81 mg by mouth daily.    Marland Kitchen doxycycline (VIBRA-TABS) 100 MG tablet Take 1 tablet (100 mg total) by mouth 2 (two) times daily. 14 tablet 1  . escitalopram (LEXAPRO) 20 MG tablet Take 20 mg by mouth at bedtime.    Marland Kitchen ezetimibe (ZETIA) 10 MG tablet Take 10 mg by mouth daily.    . fenofibrate 54 MG tablet Take 54 mg by mouth daily.    Marland Kitchen glimepiride (AMARYL) 4 MG tablet Take 4 mg by mouth daily with breakfast.    . glyBURIDE (DIABETA) 2.5 MG tablet Take 2.5-5 mg by mouth 2 (two) times daily. Reported on 02/13/2016    . LUTEIN PO Take 1 tablet by mouth daily.    . metFORMIN (GLUCOPHAGE) 1000 MG tablet Take 1,000 mg by mouth 2 (two) times daily with a meal.    . Multiple Vitamins-Minerals (MULTIVITAMIN PO) Take by mouth.    . mupirocin ointment (BACTROBAN) 2 %     . Omega-3 Fatty Acids (FISH OIL) 1000 MG CAPS Take 2,000 mg by mouth daily. Reported on 02/13/2016    . primidone (MYSOLINE) 50 MG tablet TAKE 1 TABLET TWICE DAILY. 30 tablet 0  . rosuvastatin (CRESTOR) 20 MG tablet Take 20 mg by mouth daily.    Nelva Nay SOLOSTAR 300 UNIT/ML SOPN     . valsartan-hydrochlorothiazide (DIOVAN HCT) 160-12.5 MG per tablet Take 1 tablet by mouth 2 (two) times daily.     No facility-administered medications prior to visit.     PAST MEDICAL HISTORY: Past Medical History:  Diagnosis Date  . Benign essential tremor   . Benign positional vertigo   . Cerebrovascular disease    right parietal stroke  . CVA (cerebral vascular accident) ([place])   . Degenerative arthritis   .  Diabetes mellitus   . Dyslipidemia   . GERD (gastroesophageal reflux disease)    hiatal hernia  . Gout   . H/O: vasectomy   . Hx of appendectomy   . Hx of tonsillectomy   . Hypertension   . 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 Melanoma ([place])   . Obesity   . OSA on CPAP     PAST SURGICAL HISTORY: Past Surgical History:  Procedure Laterality Date  . APPENDECTOMY    . arthroscopic knee surgery Bilateral   . CATARACT EXTRACTION Bilateral   . NASAL SEPTUM SURGERY    . TONSILLECTOMY      FAMILY HISTORY: Family History  Problem Relation Age of Onset  . Cerebral aneurysm Mother   . Tremor Mother   . Alzheimer's disease Father   . Tremor Brother   . Tremor Maternal Uncle   . Diabetes Maternal Grandmother   . Colon cancer Neg Hx     SOCIAL HISTORY: Social History   Social History  . Marital status: Married    Spouse name: [name]  . Number of children: 2  . Years of education: Bachelors    Occupational History  .  [place] History Main Topics  . Smoking status: Former Smoker    Packs/day: 1.00    Types: Cigarettes    Quit date: 11/25/1983  . Smokeless tobacco: Never Used  . Alcohol use 1.2 oz/week    2 Standard drinks or equivalent per week     Comment: occassionally  . Drug use: No  . Sexual activity: Not on file   Other Topics Concern  . Not on file   Social History Narrative   Patient lives at home with wife. [name](   Patient has 2 children that are in good health.    Patient works for [employer]. Retired .   Patient has a Bachelors degree in History.       PHYSICAL EXAM  Vitals:   08/13/16 0735  BP: 140/82  Pulse: 68  Resp: 20  Weight: 271 lb (122.9 kg)  Height: 6\' 4"  (1.93 m)   Body mass index is 32.99 kg/m.  Generalized: Well developed, in no acute distress   Neurological examination  Mentation: Alert oriented to time, place, history taking. Follows all commands speech and language  fluent Cranial nerve II-XII: Pupils were equal round reactive to light. Extraocular movements were full, visual field were full on confrontational test. Facial sensation and strength were normal. Uvula tongue midline. Head turning and shoulder shrug  were normal and symmetric. Motor: The motor testing reveals 5 over 5 strength of all 4 extremities. Good symmetric motor tone is noted throughout.  Sensory: Sensory testing is intact to soft touch on all 4 extremities. No evidence of extinction is noted.  Coordination: Cerebellar testing reveals good finger-nose-finger and heel-to-shin bilaterally.  Gait and station: Gait is normal. Tandem gait is normal. Romberg is negative. No drift is seen.  Reflexes: Deep tendon reflexes are symmetric and normal bilaterally.   DIAGNOSTIC DATA (LABS, IMAGING, TESTING) - I reviewed patient records, labs, notes, testing and imaging myself where available.     Lab Results  Component Value Date   HGBA1C (H) 04/09/2011    7.6 (NOTE)                                                                       According to the ADA Clinical Practice Recommendations for 2011, when HbA1c is used as a screening test:   >=6.5%   Diagnostic of Diabetes Mellitus           (if abnormal result  is confirmed)  5.7-6.4%   Increased risk of developing Diabetes Mellitus  References:Diagnosis and Classification of Diabetes Mellitus,Diabetes [id number] 1):S62-S69 and Standards of Medical Care in         Diabetes - 2011,Diabetes Care,2011,34  (Suppl 1):S11-S61.  ASSESSMENT AND PLAN 64 y.o. year old male  has a past medical history of Benign essential tremor; Benign positional vertigo; Cerebrovascular disease; CVA (cerebral vascular accident) ([place]); Degenerative arthritis; Diabetes mellitus; Dyslipidemia; GERD (gastroesophageal reflux disease); Gout; H/O: vasectomy; appendectomy; tonsillectomy; Hypertension; Ischemic optic neuropathy; Melanoma ([place]); Obesity; and OSA on  CPAP. here with:  1. Essential tremor 2. Vertigo  Overall the patient is doing well. At this time he has not required any medication for his tremor. We will continue to monitor. Fortunately he has not had any episodes of vertigo. Patient advised that if his symptoms worsen or he develops any new symptoms he shouldl let us know. He will follow-up in one year with Dr. Lucio Edward, MSN, NP-C 08/13/2016, 7:30 AM [facility] [address] [place], [place] [address] [phone]

## 2016-08-13 NOTE — Patient Instructions (Signed)
Monitor tremor If your symptoms worsen or you develop new symptoms please let us know.

## 2016-08-13 NOTE — Progress Notes (Signed)
I have read the note, and I agree with the clinical assessment and plan.  Cariann Kinnamon KEITH   

## 2016-11-24 DIAGNOSIS — I2699 Other pulmonary embolism without acute cor pulmonale: Secondary | ICD-10-CM

## 2016-11-24 DIAGNOSIS — I82409 Acute embolism and thrombosis of unspecified deep veins of unspecified lower extremity: Secondary | ICD-10-CM

## 2016-11-24 HISTORY — DX: Other pulmonary embolism without acute cor pulmonale: I26.99

## 2016-11-24 HISTORY — DX: Acute embolism and thrombosis of unspecified deep veins of unspecified lower extremity: I82.409

## 2016-12-15 ENCOUNTER — Ambulatory Visit (INDEPENDENT_AMBULATORY_CARE_PROVIDER_SITE_OTHER): Payer: Medicare Other

## 2016-12-15 ENCOUNTER — Ambulatory Visit (INDEPENDENT_AMBULATORY_CARE_PROVIDER_SITE_OTHER): Payer: Medicare Other | Admitting: Physician Assistant

## 2016-12-15 VITALS — BP 170/104 | HR 74 | Temp 98.5°F | Resp 18 | Ht 76.0 in | Wt 273.0 lb

## 2016-12-15 DIAGNOSIS — G8929 Other chronic pain: Secondary | ICD-10-CM

## 2016-12-15 DIAGNOSIS — M5030 Other cervical disc degeneration, unspecified cervical region: Secondary | ICD-10-CM

## 2016-12-15 DIAGNOSIS — M25511 Pain in right shoulder: Secondary | ICD-10-CM

## 2016-12-15 DIAGNOSIS — M47812 Spondylosis without myelopathy or radiculopathy, cervical region: Secondary | ICD-10-CM

## 2016-12-15 MED ORDER — TRAMADOL-ACETAMINOPHEN 37.5-325 MG PO TABS: 1.0000 | ORAL_TABLET | Freq: Four times a day (QID) | ORAL | 0 refills | Status: DC | PRN

## 2016-12-15 MED ORDER — CYCLOBENZAPRINE HCL 10 MG PO TABS: 5.0000 mg | ORAL_TABLET | Freq: Three times a day (TID) | ORAL | 0 refills | Status: DC | PRN

## 2016-12-15 NOTE — Progress Notes (Signed)
12/15/2016 9:30 AM   DOB: [birth year] / MRN: [id number]  SUBJECTIVE:  Joshua [name] is a 67 y.o. male presenting for constant right sided shoulder pain that started just before Christmas of 2017. Complains of dull shoulder pain in the right side and tells me this has been keeping him up at night.  Has tried to seek the help or orthopedics but can't be seen until February.  Does not know what makes the pain worse or better.  Does report the right shoulder has been "catching" but he can't replicate this.  No known acute injury. Denies a history of liver disease. Has tried Ibuprofen 600 mg QHS without much relief of pain. Tells me that for "as long as I can remember I've had a crunchy neck."  Has a history of melanoma about the right forehead and this was taken off without complication 10 years ago.     He has No Known Allergies.   He  has a past medical history of Benign essential tremor; Benign positional vertigo; Cerebrovascular disease; CVA (cerebral vascular accident) ([place]); Degenerative arthritis; Diabetes mellitus; Dyslipidemia; GERD (gastroesophageal reflux disease); Gout; H/O: vasectomy; appendectomy; tonsillectomy; Hypertension; Ischemic optic neuropathy; Melanoma ([place]); Obesity; and OSA on CPAP.    He  reports that he quit smoking about 33 years ago. His smoking use included Cigarettes. He smoked 1.00 pack per day. He has never used smokeless tobacco. He reports that he drinks about 1.2 oz of alcohol per week . He reports that he does not use drugs. He  has no sexual activity history on file. The patient  has a past surgical history that includes Nasal septum surgery; Cataract extraction (Bilateral); arthroscopic knee surgery (Bilateral); Tonsillectomy; and Appendectomy.  His family history includes Alzheimer's disease in his father; Cerebral aneurysm in his mother; Diabetes in his maternal grandmother; Tremor in his brother, maternal uncle, and mother.  Review of Systems  Constitutional:  Negative for fever.  Cardiovascular: Negative for chest pain.  Gastrointestinal: Negative for nausea.  Musculoskeletal: Positive for joint pain, myalgias and neck pain. Negative for back pain and falls.  Skin: Negative for rash.  Neurological: Negative for dizziness and focal weakness.    The problem list and medications were reviewed and updated by myself where necessary and exist elsewhere in the encounter.   OBJECTIVE:  BP (!) 170/104 (BP Location: Right Arm, Patient Position: Sitting, Cuff Size: Small)   Pulse 74   Temp 98.5 F (36.9 C) (Oral)   Resp 18   Ht 6\' 4"  (1.93 m)   Wt 273 lb (123.8 kg)   SpO2 97%   BMI 33.23 kg/m   Physical Exam  Constitutional: He is oriented to person, place, and time.  Cardiovascular: Normal rate and regular rhythm.   Pulmonary/Chest: Effort normal and breath sounds normal.  Musculoskeletal: Normal range of motion. He exhibits no edema or deformity.       Right shoulder: He exhibits tenderness (infraspinatus, middle rhomboid). He exhibits normal range of motion, no bony tenderness, no swelling, no effusion, no crepitus, no deformity, no laceration, no pain, no spasm, normal pulse and normal strength.       Arms: Neurological: He is alert and oriented to person, place, and time. He has normal reflexes.    Lab Results  Component Value Date   ALT 26 02/02/2014   AST 33 02/02/2014   ALKPHOS 78 02/02/2014   BILITOT 0.3 02/02/2014   Lab Results  Component Value Date   CREATININE 1.58 (H) 02/02/2014  Lab Results  Component Value Date   HGBA1C (H) 04/09/2011    7.6 (NOTE)                                                                       According to the ADA Clinical Practice Recommendations for 2011, when HbA1c is used as a screening test:   >=6.5%   Diagnostic of Diabetes Mellitus           (if abnormal result  is confirmed)  5.7-6.4%   Increased risk of developing Diabetes Mellitus  References:Diagnosis and Classification of Diabetes  [place],Diabetes [id number] 1):S62-S69 and Standards of Medical Care in         Diabetes - 2011,Diabetes Care,2011,34  (Suppl 1):S11-S61.     No results found for this or any previous visit (from the past 72 hour(s)).  Dg Cervical Spine 2 Or 3 Views  Result Date: 12/15/2016 CLINICAL DATA:  Chronic right shoulder pain. EXAM: CERVICAL SPINE - 2-3 VIEW COMPARISON:  None. FINDINGS: There is no evidence of cervical spine fracture or prevertebral soft tissue swelling. Alignment is normal. Disc spaces are well-maintained. Minimal anterior osteophyte formation is noted at C2-3, C3-4 and C4-5. IMPRESSION: Minimal degenerative changes as described above. No other significant abnormality seen in the cervical spine. Electronically Signed   By: Marijo Conception, M.D.   On: 12/15/2016 09:25   Dg Shoulder Right  Result Date: 12/15/2016 CLINICAL DATA:  Chronic right shoulder pain. EXAM: RIGHT SHOULDER - 2+ VIEW COMPARISON:  None. FINDINGS: There is no evidence of fracture or dislocation. There is no evidence of arthropathy or other focal bone abnormality. Soft tissues are unremarkable. IMPRESSION: No definite abnormality seen in the right shoulder. Electronically Signed   By: Marijo Conception, M.D.   On: 12/15/2016 09:21    ASSESSMENT AND PLAN:  Joshua [name] was seen today for shoulder pain.  Diagnoses and all orders for this visit:  Chronic right shoulder pain: Starting Ultracet and Flexeril.  Will consider and low dose NSAID to add if this plan is not helping him significantly.  He has an ortho appointment coming up in February.  -     DG Shoulder Right; Future -     DG Cervical Spine 2 or 3 views; Future    The patient is advised to call or return to clinic if he does not see an improvement in symptoms, or to seek the care of the closest emergency department if he worsens with the above plan.   Philis Fendt, MHS, PA-C [facility] 12/15/2016 9:30 AM

## 2016-12-15 NOTE — Patient Instructions (Signed)
     IF you received an x-ray today, you will receive an invoice from [facility]. Please contact [facility] at [phone] with questions or concerns regarding your invoice.   IF you received labwork today, you will receive an invoice from [facility]. Please contact [facility] at [phone] with questions or concerns regarding your invoice.   Our billing staff will not be able to assist you with questions regarding bills from these companies.  You will be contacted with the lab results as soon as they are available. The fastest way to get your results is to activate your My Chart account. Instructions are located on the last page of this paperwork. If you have not heard from us regarding the results in 2 weeks, please contact this office.     

## 2016-12-22 ENCOUNTER — Encounter: Payer: Self-pay | Admitting: Adult Health

## 2016-12-22 ENCOUNTER — Ambulatory Visit (INDEPENDENT_AMBULATORY_CARE_PROVIDER_SITE_OTHER): Payer: Medicare Other | Admitting: Adult Health

## 2016-12-22 VITALS — BP 144/82 | HR 78 | Ht 76.0 in | Wt 276.0 lb

## 2016-12-22 DIAGNOSIS — G250 Essential tremor: Secondary | ICD-10-CM

## 2016-12-22 MED ORDER — PRIMIDONE 50 MG PO TABS: ORAL_TABLET | ORAL | 5 refills | Status: DC

## 2016-12-22 NOTE — Progress Notes (Signed)
PATIENT: Joshua [name] DOB: 1951-03-03  REASON FOR VISIT: follow up- benign essential tremor, dizziness HISTORY FROM: patient  HISTORY OF PRESENT ILLNESS: Today 12/22/2016: [name] is a 66 year old male with a history of benign essential tremor and dizziness. He returns today for follow-up. He reports that his tremor has gotten worse. He states that he  now notices it when he is eating, buttoning buttons and his handwriting. He states the tremor is located in both hands. He states occasionally he feels as if his headache has a tremor although he states this is rare. He states when he is ambulating occasionally he feels as if he is dragging the left foot. Sometimes feels as if he veers to the left when ambulating. Denies any difficulty with swallowing. No trouble sleeping. He uses CPAP therapy at night. He reports that his mother also had an essential tremor. He returns today for an evaluation.   HISTORY 08/13/16: [name] is a 66 year old male with a history of benign essential tremor and dizziness. He returns today for follow-up. The patient reports that he has remained stable. He denies any episodes of vertigo. He states that he continues to have tremor in both hands. He did participate and occupational therapy and found the weighted  pens beneficial. He states that the weighted utensils did not help much. He states he is able to complete all ADLs independently. At this time he does not want to try any medication. He returns today for an evaluation.  HISTORY 08/14/15:[name] is a 66 year old male with a history of benign essential tremor and dizziness. He returns today for follow-up. The patient states that he has not had a true episode of vertigo in over a year and a half. He states that he continues to have the tremor in both hands. He is not sure if one side is worse than other at this point. He states that he notices that when he is trying to button a shirt, when he eats and with his  handwriting. He is a patient at the [facility]. They recommended a referral to occupational therapy to see if the patient gets any benefit from using weighted utensils and weighted pens. At this time the patient does not want to try medication. He returns today for an evaluation.    REVIEW OF SYSTEMS: Out of a complete 14 system review of symptoms, the patient complains only of the following symptoms, and all other reviewed systems are negative. Frequency of urination, dizziness, tremors, back pain, apnea, hearing loss  ALLERGIES: No Known Allergies  HOME MEDICATIONS: Outpatient Medications Prior to Visit  Medication Sig Dispense Refill  . aspirin 81 MG tablet Take 81 mg by mouth daily.    . cyclobenzaprine (FLEXERIL) 10 MG tablet Take 0.5-1 tablets (5-10 mg total) by mouth 3 (three) times daily as needed for muscle spasms (May cause drowsiness. Do no operate heavy machinery while taking.). 90 tablet 0  . escitalopram (LEXAPRO) 20 MG tablet Take 20 mg by mouth at bedtime.    . fenofibrate 54 MG tablet Take 54 mg by mouth daily.    . LUTEIN PO Take 25 mg by mouth daily.     . metFORMIN (GLUCOPHAGE) 1000 MG tablet Take 1,000 mg by mouth 2 (two) times daily with a meal.    . Multiple Vitamins-Minerals (MULTIVITAMIN PO) Take by mouth.    . Omega-3 Fatty Acids (FISH OIL) 1000 MG CAPS Take 2,000 mg by mouth daily. Reported on 02/13/2016    .  TOUJEO SOLOSTAR 300 UNIT/ML SOPN     . traMADol-acetaminophen (ULTRACET) 37.5-325 MG tablet Take 1-2 tablets by mouth every 6 (six) hours as needed for moderate pain. 90 tablet 0  . TRULICITY 1.5 0000000 SOPN once a week.     . primidone (MYSOLINE) 50 MG tablet TAKE 1 TABLET TWICE DAILY. 30 tablet 0  . ezetimibe (ZETIA) 10 MG tablet Take 10 mg by mouth daily.    Marland Kitchen glimepiride (AMARYL) 4 MG tablet Take 4 mg by mouth daily with breakfast.    . glyBURIDE (DIABETA) 2.5 MG tablet Take 2.5-5 mg by mouth 2 (two) times daily. Reported on 02/13/2016    . mupirocin  ointment (BACTROBAN) 2 %     . rosuvastatin (CRESTOR) 20 MG tablet Take 20 mg by mouth daily.    . valsartan-hydrochlorothiazide (DIOVAN HCT) 160-12.5 MG per tablet Take 1 tablet by mouth 2 (two) times daily.     No facility-administered medications prior to visit.     PAST MEDICAL HISTORY: Past Medical History:  Diagnosis Date  . Benign essential tremor   . Benign positional vertigo   . Cerebrovascular disease    right parietal stroke  . CVA (cerebral vascular accident) ([place])   . Degenerative arthritis   . Diabetes mellitus   . Dyslipidemia   . GERD (gastroesophageal reflux disease)    hiatal hernia  . Gout   . H/O: vasectomy   . Hx of appendectomy   . Hx of tonsillectomy   . Hypertension   . 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 Melanoma ([place])   . Obesity   . OSA on CPAP     PAST SURGICAL HISTORY: Past Surgical History:  Procedure Laterality Date  . APPENDECTOMY    . arthroscopic knee surgery Bilateral   . CATARACT EXTRACTION Bilateral   . NASAL SEPTUM SURGERY    . TONSILLECTOMY      FAMILY HISTORY: Family History  Problem Relation Age of Onset  . Cerebral aneurysm Mother   . Tremor Mother   . Alzheimer's disease Father   . Tremor Brother   . Tremor Maternal Uncle   . Diabetes Maternal Grandmother   . Colon cancer Neg Hx     SOCIAL HISTORY: Social History   Social History  . Marital status: Married    Spouse name: [name]  . Number of children: 2  . Years of education: Bachelors    Occupational History  .  [place] History Main Topics  . Smoking status: Former Smoker    Packs/day: 1.00    Types: Cigarettes    Quit date: 11/25/1983  . Smokeless tobacco: Never Used  . Alcohol use 1.2 oz/week    2 Standard drinks or equivalent per week     Comment: occassionally  . Drug use: No  . Sexual activity: Not on file   Other Topics Concern  . Not on file   Social History Narrative   Patient lives at home with wife.  [name](   Patient has 2 children that are in good health.    Patient works for [employer]. Retired .   Patient has a Bachelors degree in History.       PHYSICAL EXAM  Vitals:   12/22/16 1308  BP: (!) 144/82  Pulse: 78  Weight: 276 lb (125.2 kg)  Height: 6\' 4"  (1.93 m)   Body mass index is 33.6 kg/m.  Generalized: Well developed, in no acute distress  Neurological examination  Mentation: Alert oriented to time, place, history taking. Follows all commands speech and language fluent Cranial nerve II-XII: Pupils were equal round reactive to light. Extraocular movements were full, visual field were full on confrontational test. Facial sensation and strength were normal. Uvula tongue midline. Head turning and shoulder shrug  were normal and symmetric. Motor: The motor testing reveals 5 over 5 strength of all 4 extremities. Good symmetric motor tone is noted throughout. Intention tremor noted in both hands. Sensory: Sensory testing is intact to soft touch on all 4 extremities. No evidence of extinction is noted.  Coordination: Cerebellar testing reveals good finger-nose-finger and heel-to-shin bilaterally.  Gait and station: Gait is normal. Tandem gait is normal. Romberg is negative. No drift is seen.  Reflexes: Deep tendon reflexes are symmetric and normal bilaterally.   DIAGNOSTIC DATA (LABS, IMAGING, TESTING) - I reviewed patient records, labs, notes, testing and imaging myself where available.  Lab Results  Component Value Date   WBC 9.4 02/02/2014   HGB 14.5 02/02/2014   HCT 41.0 02/02/2014   MCV 87.6 02/02/2014   PLT 244 02/02/2014      Component Value Date/Time   NA 139 02/02/2014 1108   K 5.0 02/02/2014 1108   CL 101 02/02/2014 1108   CO2 20 02/02/2014 1108   GLUCOSE 228 (H) 02/02/2014 1108   BUN 26 (H) 02/02/2014 1108   CREATININE 1.58 (H) 02/02/2014 1108   CALCIUM 8.8 02/02/2014 1108   PROT 7.0 02/02/2014 1108   ALBUMIN 3.4 (L) 02/02/2014 1108    AST 33 02/02/2014 1108   ALT 26 02/02/2014 1108   ALKPHOS 78 02/02/2014 1108   BILITOT 0.3 02/02/2014 1108   GFRNONAA 45 (L) 02/02/2014 1108   GFRAA 52 (L) 02/02/2014 1108         ASSESSMENT AND PLAN 66 y.o. year old male  has a past medical history of Benign essential tremor; Benign positional vertigo; Cerebrovascular disease; CVA (cerebral vascular accident) ([place]); Degenerative arthritis; Diabetes mellitus; Dyslipidemia; GERD (gastroesophageal reflux disease); Gout; H/O: vasectomy; appendectomy; tonsillectomy; Hypertension; Ischemic optic neuropathy; Melanoma ([place]); Obesity; and OSA on CPAP. here with:  1. Essential tremor  The patient feels that his tremor has gotten worse. He would like to now use medication. We will begin with 25 mg of primidone at bedtime for 1 week. If he is tolerating that he will increase to 25 mg twice a day. I have reviewed side effects of primidone with the patient. I have also discussed with the patient the difference between essential tremor and a parkinsonian tremor. Patient advised that if he is unable to tolerate the medication or his symptoms get worse he should let us know. He will follow-up in 6 months or sooner if needed.   I spent 25 minutes with the patient 50% of this time was spent discussing new medication primidone and potential side effects. We also discussed essential tremor versus parkinsonian tremor.   Ward Givens, MSN, NP-C 12/22/2016, 2:10 PM [facility] [address] [place], [place] [address] [phone]

## 2016-12-22 NOTE — Patient Instructions (Signed)
Start Primidone 1/2 tablet at bedtime for 1 week then increase to 1/2 tablet twice a day If your symptoms worsen or you develop new symptoms please let us know.   Primidone tablets What is this medicine? PRIMIDONE (PRI mi done) is a barbiturate. This medicine is used to control seizures in certain types of epilepsy. It is not for use in absence (petit mal) seizures. This medicine may be used for other purposes; ask your health care provider or pharmacist if you have questions. COMMON BRAND NAME(S): Mysoline What should I tell my health care provider before I take this medicine? They need to know if you have any of these conditions: -kidney disease -liver disease -porphyria -suicidal thoughts, plans, or attempt; a previous suicide attempt by you or a family member -an unusual or allergic reaction to primidone, phenobarbital, other barbiturates or seizure medications, other medicines, foods, dyes, or preservatives -pregnant or trying to get pregnant -breast-feeding How should I use this medicine? Take this medicine by mouth with a glass of water. Follow the directions on the prescription label. Take your doses at regular intervals. Do not take your medicine more often than directed. Do not stop taking except on the advice of your doctor or health care professional. A special MedGuide will be given to you by the pharmacist with each prescription and refill. Be sure to read this information carefully each time. Contact your pediatrician or health care professional regarding the use of this medication in children. Special care may be needed. While this drug may be prescribed for children for selected conditions, precautions do apply. Overdosage: If you think you have taken too much of this medicine contact a poison control center or emergency room at once. NOTE: This medicine is only for you. Do not share this medicine with others. What if I miss a dose? If you miss a dose, take it as soon as you  can. If it is almost time for your next dose, take only that dose. Do not take double or extra doses. What may interact with this medicine? Do not take this medicine with any of the following medications: -voriconazole This medicine may also interact with the following medications: -cancer-treating medications -cyclosporine -disopyramide -doxycycline -male hormones, including contraceptive or birth control pills -medicines for mental depression, anxiety or other mood problems -medicines for treating HIV infection or AIDS -modafinil -prescription pain medications -quinidine -warfarin This list may not describe all possible interactions. Give your health care provider a list of all the medicines, herbs, non-prescription drugs, or dietary supplements you use. Also tell them if you smoke, drink alcohol, or use illegal drugs. Some items may interact with your medicine. What should I watch for while using this medicine? Visit your doctor or health care professional for regular checks on your progress. It may be 2 to 3 weeks before you see the full effects of this medicine. Do not suddenly stop taking this medicine, you may increase the risk of seizures. Your doctor or health care professional may want to gradually reduce the dose. Wear a medical identification bracelet or chain to say you have epilepsy, and carry a card that lists all your medications. You may get drowsy or dizzy. Do not drive, use machinery, or do anything that needs mental alertness until you know how this medicine affects you. Do not stand or sit up quickly, especially if you are an older patient. This reduces the risk of dizzy or fainting spells. Alcohol may interfere with the effect of this medicine. Avoid  alcoholic drinks. Birth control pills may not work properly while you are taking this medicine. Talk to your doctor about using an extra method of birth control. The use of this medicine may increase the chance of suicidal  thoughts or actions. Pay special attention to how you are responding while on this medicine. Any worsening of mood, or thoughts of suicide or dying should be reported to your health care professional right away. Women who become pregnant while using this medicine may enroll in the [place] Pregnancy Registry by calling [phone]. This registry collects information about the safety of antiepileptic drug use during pregnancy. What side effects may I notice from receiving this medicine? Side effects that you should report to your doctor or health care professional as soon as possible: -allergic reactions like skin rash, itching or hives, swelling of the face, lips, or tongue -blurred, double vision, or uncontrollable rolling or movements of the eyes -redness, blistering, peeling or loosening of the skin, including inside the mouth -shortness of breath or difficulty breathing -unusual excitement or restlessness, more likely in children and the elderly -unusually weak or tired -worsening of mood, thoughts or actions of suicide or dying Side effects that usually do not require medical attention (report to your doctor or health care professional if they continue or are bothersome): -clumsiness, unsteadiness, or a hang-over effect -decreased sexual ability -dizziness, drowsiness -loss of appetite -nausea or vomiting This list may not describe all possible side effects. Call your doctor for medical advice about side effects. You may report side effects to FDA at 1-800-FDA-1088. Where should I keep my medicine? Keep out of the reach of children. This medicine may cause accidental overdose and death if it taken by other adults, children, or pets. Mix any unused medicine with a substance like cat litter or coffee grounds. Then throw the medicine away in a sealed container like a sealed bag or a coffee can with a lid. Do not use the medicine after the expiration date. Store at  room temperature between 15 and 30 degrees C (59 and 86 degrees F). NOTE: This sheet is a summary. It may not cover all possible information. If you have questions about this medicine, talk to your doctor, pharmacist, or health care provider.  2017 Elsevier/Gold Standard (2014-01-06 15:40:08)

## 2016-12-22 NOTE — Progress Notes (Signed)
I have read the note, and I agree with the clinical assessment and plan.  WILLIS,CHARLES KEITH   

## 2017-03-06 ENCOUNTER — Other Ambulatory Visit: Payer: Self-pay | Admitting: Orthopedic Surgery

## 2017-03-10 ENCOUNTER — Other Ambulatory Visit: Payer: Self-pay | Admitting: Orthopedic Surgery

## 2017-03-14 ENCOUNTER — Ambulatory Visit (HOSPITAL_COMMUNITY)
Admission: EM | Admit: 2017-03-14 | Discharge: 2017-03-14 | Disposition: A | Discharge status: Home / Self Care | Payer: Medicare Other | Attending: Family Medicine | Admitting: Family Medicine

## 2017-03-14 ENCOUNTER — Encounter (HOSPITAL_COMMUNITY): Payer: Self-pay | Admitting: Emergency Medicine

## 2017-03-14 DIAGNOSIS — L02612 Cutaneous abscess of left foot: Secondary | ICD-10-CM | POA: Diagnosis not present

## 2017-03-14 DIAGNOSIS — IMO0001 Reserved for inherently not codable concepts without codable children: Secondary | ICD-10-CM

## 2017-03-14 DIAGNOSIS — T814XXA Infection following a procedure, initial encounter: Principal | ICD-10-CM

## 2017-03-14 MED ORDER — MUPIROCIN CALCIUM 2 % EX CREA: 1.0000 "application " | TOPICAL_CREAM | Freq: Two times a day (BID) | CUTANEOUS | 0 refills | Status: AC

## 2017-03-14 MED ORDER — SULFAMETHOXAZOLE-TRIMETHOPRIM 800-160 MG PO TABS: 1.0000 | ORAL_TABLET | Freq: Two times a day (BID) | ORAL | 0 refills | Status: DC

## 2017-03-14 MED ORDER — CLINDAMYCIN HCL 300 MG PO CAPS: 300.0000 mg | ORAL_CAPSULE | Freq: Three times a day (TID) | ORAL | 0 refills | Status: AC

## 2017-03-14 NOTE — Discharge Instructions (Addendum)
I prefer you to see wound clinic next week for follow up, however it seems that you would rather see your podiatry first.   Please see your podiatry next week.   Start the antibiotic ointment and start the clindamycin.

## 2017-03-14 NOTE — ED Provider Notes (Signed)
CSN: [id number]     Arrival date & time 03/14/17  1209 History   First MD Initiated Contact with Patient 03/14/17 1339     Chief Complaint  Patient presents with  . Diabetic Ulcer   (Consider location/radiation/quality/duration/timing/severity/associated sxs/prior Treatment) Patient is a 66 y.o. Male, with hx of T2DM with diabetic peripheral neuropathy, is here for a wound to his left sole of foot. He noticed the wound last night however he have noticed some discomfort approximately 5 days ago. He also reports callus at the area of the wound.The callus has been building up long for a while. He reports the wound to be non-painful but is slightly discomfort upon ambulation. He reports the wound is not draining. He has a PCP and a podiatry that he sees regularly. His last Hgba1c was 7.2 two months ago. He had a similar wound 10 months ago at the same location but healed after going to the wound clinic.        Past Medical History:  Diagnosis Date  . Benign essential tremor   . Benign positional vertigo   . Cerebrovascular disease    right parietal stroke  . CVA (cerebral vascular accident) ([place])   . Degenerative arthritis   . Diabetes mellitus   . Dyslipidemia   . GERD (gastroesophageal reflux disease)    hiatal hernia  . Gout   . H/O: vasectomy   . Hx of appendectomy   . Hx of tonsillectomy   . Hypertension   . 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 Melanoma ([place])   . Obesity   . OSA on CPAP    Past Surgical History:  Procedure Laterality Date  . APPENDECTOMY    . arthroscopic knee surgery Bilateral   . CATARACT EXTRACTION Bilateral   . NASAL SEPTUM SURGERY    . TONSILLECTOMY     Family History  Problem Relation Age of Onset  . Cerebral aneurysm Mother   . Tremor Mother   . Alzheimer's disease Father   . Tremor Brother   . Tremor Maternal Uncle   . Diabetes Maternal Grandmother   . Colon cancer Neg Hx    Social History  Substance Use Topics  . Smoking  status: Former Smoker    Packs/day: 1.00    Types: Cigarettes    Quit date: 11/25/1983  . Smokeless tobacco: Never Used  . Alcohol use 1.2 oz/week    2 Standard drinks or equivalent per week     Comment: occassionally    Review of Systems  Constitutional:       As stated in the HPI    Allergies  Patient has no known allergies.  Home Medications   Prior to Admission medications   Medication Sig Start Date End Date Taking? Authorizing Provider  aspirin 81 MG tablet Take 81 mg by mouth daily.   Yes Historical Provider, MD  atorvastatin (LIPITOR) 40 MG tablet Take 40 mg by mouth daily.   Yes Historical Provider, MD  escitalopram (LEXAPRO) 20 MG tablet Take 20 mg by mouth at bedtime.   Yes Historical Provider, MD  fenofibrate 54 MG tablet Take 54 mg by mouth daily.   Yes Historical Provider, MD  glipiZIDE (GLUCOTROL) 5 MG tablet Take by mouth 2 (two) times daily before a meal.   Yes Historical Provider, MD  hydrochlorothiazide (HYDRODIURIL) 25 MG tablet Take 25 mg by mouth daily.   Yes Historical Provider, MD  losartan (COZAAR) 100 MG tablet Take 100 mg  by mouth daily.   Yes Historical Provider, MD  LUTEIN PO Take 25 mg by mouth daily.    Yes Historical Provider, MD  metFORMIN (GLUCOPHAGE) 1000 MG tablet Take 1,000 mg by mouth 2 (two) times daily with a meal.   Yes Historical Provider, MD  Multiple Vitamins-Minerals (MULTIVITAMIN PO) Take by mouth.   Yes Historical Provider, MD  Omega-3 Fatty Acids (FISH OIL) 1000 MG CAPS Take 2,000 mg by mouth daily. Reported on 02/13/2016   Yes Historical Provider, MD  primidone (MYSOLINE) 50 MG tablet Take 1/2 tablet PO at bedtime for 1 week, if tolerating increase to 1/2 tablet BID 12/22/16  Yes Ward Givens, NP  TOUJEO SOLOSTAR 300 UNIT/ML SOPN  07/31/15  Yes Historical Provider, MD  traMADol-acetaminophen (ULTRACET) 37.5-325 MG tablet Take 1-2 tablets by mouth every 6 (six) hours as needed for moderate pain. 12/15/16  Yes Tereasa Coop, PA-C    TRULICITY 1.5 EX/5.2WU SOPN once a week.  08/02/16  Yes Historical Provider, MD  cyclobenzaprine (FLEXERIL) 10 MG tablet Take 0.5-1 tablets (5-10 mg total) by mouth 3 (three) times daily as needed for muscle spasms (May cause drowsiness. Do no operate heavy machinery while taking.). 12/15/16   Tereasa Coop, PA-C  mupirocin cream (BACTROBAN) 2 % Apply 1 application topically 2 (two) times daily. 03/14/17 03/21/17  Barry Dienes, NP  sulfamethoxazole-trimethoprim (BACTRIM DS,SEPTRA DS) 800-160 MG tablet Take 1 tablet by mouth 2 (two) times daily. 03/14/17 03/21/17  Barry Dienes, NP   Meds Ordered and Administered this Visit  Medications - No data to display  BP (!) 135/57 (BP Location: Right Arm)   Pulse 87   Temp 98.5 F (36.9 C) (Oral)   Resp 16   SpO2 97%  No data found.   Physical Exam  Constitutional: He appears well-developed and well-nourished.  HENT:  Head: Normocephalic and atraumatic.  Cardiovascular: Normal rate and regular rhythm.   Murmur heard. Grade 1-2 systolic murmur present.   Pulmonary/Chest: Effort normal and breath sounds normal.  Abdominal: Soft. Bowel sounds are normal. There is no tenderness.  Neurological:  Monofilament: Left foot 7/10 Right foot: 5/10  Skin: Skin is warm and dry.  See picture. Non-tender to palpate. No surrounding erythema or swelling. The area is fluctuant to palpate.   Nursing note and vitals reviewed.     Urgent Care Course     Procedures (including critical care time)  Labs Review Labs Reviewed - No data to display  Imaging Review No results found.  MDM   1. Wound abscess, initial encounter    1) A small incision was made using a #15 blade and moderate to large amt of purulent drainage was extracted. Wound cover using sterile gauze. 2) Apply Bactroban ointment to the area.  3) Start taking bactrim BID x 7 days 4) Recommended wound clinic follow up next week; but patient did not have a good experience with [facility] wound  clinic previously, he states that he would see his podiatry next week while searching for a new wound clinic.     Barry Dienes, NP 03/14/17 [phone]

## 2017-03-14 NOTE — ED Triage Notes (Signed)
Here for possible diabetic ulcer on bottom of left foot  sts he had similar sx 10 months ago and was seen by the wound center  Sx also include low grade fevers... Last night was 100.2  A&O x4... NAD... Steady gait.

## 2017-03-17 ENCOUNTER — Encounter: Payer: Self-pay | Admitting: Podiatry

## 2017-03-17 ENCOUNTER — Ambulatory Visit (INDEPENDENT_AMBULATORY_CARE_PROVIDER_SITE_OTHER): Payer: Medicare Other | Admitting: Podiatry

## 2017-03-17 VITALS — BP 138/77 | HR 75 | Temp 97.9°F | Resp 18

## 2017-03-17 DIAGNOSIS — L97529 Non-pressure chronic ulcer of other part of left foot with unspecified severity: Secondary | ICD-10-CM | POA: Diagnosis not present

## 2017-03-17 DIAGNOSIS — L97521 Non-pressure chronic ulcer of other part of left foot limited to breakdown of skin: Secondary | ICD-10-CM

## 2017-03-17 DIAGNOSIS — E11621 Type 2 diabetes mellitus with foot ulcer: Secondary | ICD-10-CM | POA: Diagnosis not present

## 2017-03-17 NOTE — Progress Notes (Signed)
   Subjective:    Patient ID: [name], male    DOB: 01-11-1951, 66 y.o.   MRN: [id number]  HPI  66 year old male presents the office today for concerns of a recurrent ulcer to the bottom of the left foot. In the emergency room on Sunday and he is prescribed antibiotics both topically and orally. He also was recommended to go to the wound care center however he went there and the wound healed and he was discharged but he would also like to do some other preventative measures order to help prevent this and coming back every several months. He denies any drainage or pus coming from the area when he noticed the area started as a blister over the weekend. He has no systemic complaints such as fevers, chills, nausea, vomiting. No calf pain, chest pain, shortness of breath. He has no other complaints today.  Review of Systems  All other systems reviewed and are negative.      Objective:   Physical Exam General: AAO x3, NAD  Dermatological: On the plantar aspect the left foot submetatarsal 4 hyperkeratotic lesion with what appears to be an old blister around the area. Upon debridement was unable to identify any pus. I did partially remove the posterior just to help drain the area and there is a superficial granular wound underneath it by left remainder the blister skin intact as a barrier. There is no drainage or pus in there is no swelling erythema, ascending cellulitis. There is mild edema to the foot there is no warmth or redness to the rest the foot.  Vascular: Dorsalis Pedis artery and Posterior Tibial artery pedal pulses are 2/4 bilateral with immedate capillary fill time. There is no pain with calf compression, swelling, warmth, erythema.   Neruologic: Sensation decreased with Derrel Nip monofilament.  Musculoskeletal: No gross boney pedal deformities bilateral. No pain, crepitus, or limitation noted with foot and ankle range of motion bilateral. Muscular strength 5/5 in all groups  tested bilateral.  Presents today wearing diabetic shoe     Assessment & Plan:  66 year old male recurrent left submetatarsal 4 ulceration -Treatment options discussed including all alternatives, risks, and complications -Etiology of symptoms were discussed -A partially removed some of the blister to ensure that there is no pus was reversed not. There is no drainage therefore did not culture the area today. -Continue clindamycin. -Continue mupirocin ointment to the wound daily. -Recommended cam walker to help take pressure off the area and this was dispensed today. -Monitor for any clinical signs or symptoms of infection and directed to call the office immediately should any occur or go to the ER. -RTC 1 week or sooner if needed  *I am going to have him see Liliane Channel next appointment as well. We need to do something else in order to help take pressure off of this area.   *x-ray next appointment if not healed  Celesta Gentile, DPM

## 2017-03-18 NOTE — H&P (Signed)
TOTAL KNEE ADMISSION H&P  Patient is being admitted for left total knee arthroplasty.  Subjective:  Chief Complaint:     Left knee primary OA / pain  HPI: Joshua [name], 66 y.o. male, has a history of pain and functional disability in the left knee due to arthritis and has failed non-surgical conservative treatments for greater than 12 weeks to includeNSAID's and/or analgesics, corticosteriod injections and activity modification.  Onset of symptoms was gradual, starting >10 years ago with gradually worsening course since that time. The patient noted prior procedures on the knee to include  arthroscopy x2 on the left knee(s).  Patient currently rates pain in the left knee(s) at 8 out of 10 with activity. Patient has worsening of pain with activity and weight bearing, pain that interferes with activities of daily living, pain with passive range of motion, crepitus and joint swelling.  Patient has evidence of periarticular osteophytes and joint space narrowing by imaging studies.  There is no active infection.   Risks, benefits and expectations were discussed with the patient.  Risks including but not limited to the risk of anesthesia, blood clots, nerve damage, blood vessel damage, failure of the prosthesis, infection and up to and including death.  Patient understand the risks, benefits and expectations and wishes to proceed with surgery.   PCP: Precious Reel, MD  D/C Plans:       Home   Post-op Meds:       No Rx given  Tranexamic Acid:      To be given - IV  Decadron:      Is to be given  FYI:     ASA  Norco  DME:   Rx given for - RW and 3-n-1  PT:   OPPT   Patient Active Problem List   Diagnosis Date Noted  . Cellulitis of left foot 03/07/2016  . Pre-ulcerative calluses 03/07/2016  . Type 2 diabetes mellitus with left diabetic foot ulcer ([place]) 01/14/2016  . DIABETES MELLITUS-TYPE II 08/07/2008  . GERD 08/07/2008   Past Medical History:  Diagnosis Date  . Benign essential  tremor   . Benign positional vertigo   . Cerebrovascular disease    right parietal stroke  . CVA (cerebral vascular accident) ([place])   . Degenerative arthritis   . Diabetes mellitus   . Dyslipidemia   . GERD (gastroesophageal reflux disease)    hiatal hernia  . Gout   . H/O: vasectomy   . Hx of appendectomy   . Hx of tonsillectomy   . Hypertension   . 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 Melanoma ([place])   . Obesity   . OSA on CPAP     Past Surgical History:  Procedure Laterality Date  . APPENDECTOMY    . arthroscopic knee surgery Bilateral   . CATARACT EXTRACTION Bilateral   . NASAL SEPTUM SURGERY    . TONSILLECTOMY      No prescriptions prior to admission.   No Known Allergies   Social History  Substance Use Topics  . Smoking status: Former Smoker    Packs/day: 1.00    Types: Cigarettes    Quit date: 11/25/1983  . Smokeless tobacco: Never Used  . Alcohol use 1.2 oz/week    2 Standard drinks or equivalent per week     Comment: occassionally    Family History  Problem Relation Age of Onset  . Cerebral aneurysm Mother   . Tremor Mother   . Alzheimer's disease  Father   . Tremor Brother   . Tremor Maternal Uncle   . Diabetes Maternal Grandmother   . Colon cancer Neg Hx      Review of Systems  Constitutional: Negative.   HENT: Positive for hearing loss.   Eyes: Negative.   Respiratory: Negative.   Cardiovascular: Negative.   Gastrointestinal: Positive for heartburn.  Genitourinary: Negative.   Musculoskeletal: Positive for joint pain.  Skin: Negative.   Neurological: Negative.   Endo/Heme/Allergies: Negative.   Psychiatric/Behavioral: Positive for depression.    Objective:  Physical Exam  Constitutional: He is oriented to person, place, and time. He appears well-developed.  HENT:  Head: Normocephalic.  Eyes: Pupils are equal, round, and reactive to light.  Neck: Neck supple. No JVD present. No tracheal deviation present. No thyromegaly  present.  Cardiovascular: Normal rate, regular rhythm and intact distal pulses.   Respiratory: Effort normal and breath sounds normal. No respiratory distress. He has no wheezes.  GI: Soft. There is no tenderness. There is no guarding.  Musculoskeletal:       Left knee: He exhibits decreased range of motion, swelling and bony tenderness. He exhibits no ecchymosis, no deformity, no laceration and no erythema. Tenderness found.  Lymphadenopathy:    He has no cervical adenopathy.  Neurological: He is alert and oriented to person, place, and time.  Skin: Skin is warm and dry.  Psychiatric: He has a normal mood and affect.       Labs:  Estimated body mass index is 33.6 kg/m as calculated from the following:   Height as of 12/22/16: 6\' 4"  (1.93 m).   Weight as of 12/22/16: 125.2 kg (276 lb).   Imaging Review Plain radiographs demonstrate severe degenerative joint disease of the left knee(s).  The bone quality appears to be good for age and reported activity level.  Assessment/Plan:  End stage arthritis, left knee   The patient history, physical examination, clinical judgment of the provider and imaging studies are consistent with end stage degenerative joint disease of the left knee(s) and total knee arthroplasty is deemed medically necessary. The treatment options including medical management, injection therapy arthroscopy and arthroplasty were discussed at length. The risks and benefits of total knee arthroplasty were presented and reviewed. The risks due to aseptic loosening, infection, stiffness, patella tracking problems, thromboembolic complications and other imponderables were discussed. The patient acknowledged the explanation, agreed to proceed with the plan and consent was signed. Patient is being admitted for inpatient treatment for surgery, pain control, PT, OT, prophylactic antibiotics, VTE prophylaxis, progressive ambulation and ADL's and discharge planning. The patient is  planning to be discharged home.      [place] P[place] Lavana Huckeba   PA-C  03/18/2017, 1:02 PM

## 2017-03-20 ENCOUNTER — Other Ambulatory Visit: Payer: Self-pay | Admitting: Orthopedic Surgery

## 2017-03-23 ENCOUNTER — Other Ambulatory Visit: Payer: Medicare Other

## 2017-03-23 ENCOUNTER — Ambulatory Visit (INDEPENDENT_AMBULATORY_CARE_PROVIDER_SITE_OTHER): Payer: Medicare Other | Admitting: Podiatry

## 2017-03-23 ENCOUNTER — Encounter: Payer: Self-pay | Admitting: Podiatry

## 2017-03-23 DIAGNOSIS — L97529 Non-pressure chronic ulcer of other part of left foot with unspecified severity: Secondary | ICD-10-CM | POA: Diagnosis not present

## 2017-03-23 DIAGNOSIS — L97521 Non-pressure chronic ulcer of other part of left foot limited to breakdown of skin: Secondary | ICD-10-CM

## 2017-03-23 DIAGNOSIS — E11621 Type 2 diabetes mellitus with foot ulcer: Secondary | ICD-10-CM | POA: Diagnosis not present

## 2017-03-24 NOTE — Patient Instructions (Addendum)
Joshua [name]  03/24/2017   Your procedure is scheduled on: 03-30-17  Report to [facility] Main Entrance Follow signs to Short Stay on first floor at 0500 AM  Call this number if you have problems the morning of surgery  [phone]   Remember: ONLY 1 PERSON MAY GO WITH YOU TO SHORT STAY TO GET  READY MORNING OF [place].  Do not eat food or drink liquids :After Midnight.     Take these medicines the morning of surgery with A SIP OF WATER: None. You may bring and use your eyedrops as needed                               You may not have any metal on your body including hair pins and              piercings  Do not wear jewelry, make-up, lotions, powders or perfumes, deodorant                          Men may shave face and neck.   Do not bring valuables to the hospital. [place].  Contacts, dentures or bridgework may not be worn into surgery.  Leave suitcase in the car. After surgery it may be brought to your room.    Please bring your CPAP mask and tubing    Please read over the following fact sheets you were given: _____________________________________________________________________             [facility] - Preparing for Surgery Before surgery, you can play an important role.  Because skin is not sterile, your skin needs to be as free of germs as possible.  You can reduce the number of germs on your skin by washing with CHG (chlorahexidine gluconate) soap before surgery.  CHG is an antiseptic cleaner which kills germs and bonds with the skin to continue killing germs even after washing. Please DO NOT use if you have an allergy to CHG or antibacterial soaps.  If your skin becomes reddened/irritated stop using the CHG and inform your nurse when you arrive at Short Stay. Do not shave (including legs and underarms) for at least 48 hours prior to the first CHG shower.  You may shave your face/neck. Please  follow these instructions carefully:  1.  Shower with CHG Soap the night before surgery and the  morning of Surgery.  2.  If you choose to wash your hair, wash your hair first as usual with your  normal  shampoo.  3.  After you shampoo, rinse your hair and body thoroughly to remove the  shampoo.                           4.  Use CHG as you would any other liquid soap.  You can apply chg directly  to the skin and wash                       Gently with a scrungie or clean washcloth.  5.  Apply the CHG Soap to your body ONLY FROM THE NECK DOWN.   Do not use on  face/ open                           Wound or open sores. Avoid contact with eyes, ears mouth and genitals (private parts).                       Wash face,  Genitals (private parts) with your normal soap.             6.  Wash thoroughly, paying special attention to the area where your surgery  will be performed.  7.  Thoroughly rinse your body with warm water from the neck down.  8.  DO NOT shower/wash with your normal soap after using and rinsing off  the CHG Soap.                9.  Pat yourself dry with a clean towel.            10.  Wear clean pajamas.            11.  Place clean sheets on your bed the night of your first shower and do not  sleep with pets. Day of Surgery : Do not apply any lotions/deodorants the morning of surgery.  Please wear clean clothes to the hospital/surgery center.  FAILURE TO FOLLOW THESE INSTRUCTIONS MAY RESULT IN THE CANCELLATION OF YOUR SURGERY PATIENT SIGNATURE_________________________________  NURSE SIGNATURE__________________________________  ________________________________________________________________________  How to Manage Your Diabetes Before and After Surgery  Why is it important to control my blood sugar before and after surgery? . Improving blood sugar levels before and after surgery helps healing and can limit problems. . A way of improving blood sugar control is eating a healthy diet  by: o  Eating less sugar and carbohydrates o  Increasing activity/exercise o  Talking with your doctor about reaching your blood sugar goals . High blood sugars (greater than 180 mg/dL) can raise your risk of infections and slow your recovery, so you will need to focus on controlling your diabetes during the weeks before surgery. . Make sure that the doctor who takes care of your diabetes knows about your planned surgery including the date and location.  How do I manage my blood sugar before surgery? . Check your blood sugar at least 4 times a day, starting 2 days before surgery, to make sure that the level is not too high or low. o Check your blood sugar the morning of your surgery when you wake up and every 2 hours until you get to the Short Stay unit. . If your blood sugar is less than 70 mg/dL, you will need to treat for low blood sugar: o Do not take insulin. o Treat a low blood sugar (less than 70 mg/dL) with  cup of clear juice (cranberry or apple), 4 glucose tablets, OR glucose gel. o Recheck blood sugar in 15 minutes after treatment (to make sure it is greater than 70 mg/dL). If your blood sugar is not greater than 70 mg/dL on recheck, call [phone] for further instructions. . Report your blood sugar to the short stay nurse when you get to Short Stay.  . If you are admitted to the hospital after surgery: o Your blood sugar will be checked by the staff and you will probably be given insulin after surgery (instead of oral diabetes medicines) to make sure you have good blood sugar levels. o The goal for blood sugar control after surgery  is 80-180 mg/dL.   WHAT DO I DO ABOUT MY DIABETES MEDICATION?  Marland Kitchen Do not take oral diabetes medicines (pills) the morning of surgery.  . THE  DAY BEFORE 5-618       Take your morning dose of Glimepiride only       Take your usual dose of Metformin   . NIGHT BEFORE SURGERY, take  35   units of   Tujeo Solostar  insulin.       . THE MORNING OF  SURGERY, take  35 units of Tujeo Solostar   insulin.  . The day of surgery, do not take other diabetes injectables, including Byetta (exenatide), Bydureon (exenatide ER), Victoza (liraglutide), or Trulicity (dulaglutide).  . If your CBG is greater than 220 mg/dL, you may take  of your sliding scale  . (correction) dose of insulin.    For patients with insulin pumps: Contact your diabetes doctor for specific instructions before surgery. Decrease basal rates by 20% at midnight the night before your surgery. Note that if your surgery is planned to be longer than 2 hours, your insulin pump will be removed and intravenous (IV) insulin will be started and managed by the nurses and the anesthesiologist. You will be able to restart your insulin pump once you are awake and able to manage it.  Make sure to bring insulin pump supplies to the hospital with you in case the  site needs to be changed.  Patient Signature:  Date:   Nurse Signature:  Date:   Reviewed and Endorsed by [facility] Patient Education Committee, August 2015  Incentive Spirometer  An incentive spirometer is a tool that can help keep your lungs clear and active. This tool measures how well you are filling your lungs with each breath. Taking long deep breaths may help reverse or decrease the chance of developing breathing (pulmonary) problems (especially infection) following:  A long period of time when you are unable to move or be active. BEFORE THE PROCEDURE   If the spirometer includes an indicator to show your best effort, your nurse or respiratory therapist will set it to a desired goal.  If possible, sit up straight or lean slightly forward. Try not to slouch.  Hold the incentive spirometer in an upright position. INSTRUCTIONS FOR USE  1. Sit on the edge of your bed if possible, or sit up as far as you can in bed or on a chair. 2. Hold the incentive spirometer in an upright position. 3. Breathe out normally. 4. Place  the mouthpiece in your mouth and seal your lips tightly around it. 5. Breathe in slowly and as deeply as possible, raising the piston or the ball toward the top of the column. 6. Hold your breath for 3-5 seconds or for as long as possible. Allow the piston or ball to fall to the bottom of the column. 7. Remove the mouthpiece from your mouth and breathe out normally. 8. Rest for a few seconds and repeat Steps 1 through 7 at least 10 times every 1-2 hours when you are awake. Take your time and take a few normal breaths between deep breaths. 9. The spirometer may include an indicator to show your best effort. Use the indicator as a goal to work toward during each repetition. 10. After each set of 10 deep breaths, practice coughing to be sure your lungs are clear. If you have an incision (the cut made at the time of surgery), support your incision when coughing by placing  a pillow or rolled up towels firmly against it. Once you are able to get out of bed, walk around indoors and cough well. You may stop using the incentive spirometer when instructed by your caregiver.  RISKS AND COMPLICATIONS  Take your time so you do not get dizzy or light-headed.  If you are in pain, you may need to take or ask for pain medication before doing incentive spirometry. It is harder to take a deep breath if you are having pain. AFTER USE  Rest and breathe slowly and easily.  It can be helpful to keep track of a log of your progress. Your caregiver can provide you with a simple table to help with this. If you are using the spirometer at home, follow these instructions: [place] IF:   You are having difficultly using the spirometer.  You have trouble using the spirometer as often as instructed.  Your pain medication is not giving enough relief while using the spirometer.  You develop fever of 100.5 F (38.1 C) or higher. SEEK IMMEDIATE MEDICAL CARE IF:   You cough up bloody sputum that had not been  present before.  You develop fever of 102 F (38.9 C) or greater.  You develop worsening pain at or near the incision site. MAKE SURE YOU:   Understand these instructions.  Will watch your condition.  Will get help right away if you are not doing well or get worse. Document Released: 03/23/2007 Document Revised: 02/02/2012 Document Reviewed: 05/24/2007 ExitCare Patient Information 2014 ExitCare, [place].   ________________________________________________________________________  WHAT IS A BLOOD TRANSFUSION? Blood Transfusion Information  A transfusion is the replacement of blood or some of its parts. Blood is made up of multiple cells which provide different functions.  Red blood cells carry oxygen and are used for blood loss replacement.  White blood cells fight against infection.  Platelets control bleeding.  Plasma helps clot blood.  Other blood products are available for specialized needs, such as hemophilia or other clotting disorders. BEFORE THE TRANSFUSION  Who gives blood for transfusions?   Healthy volunteers who are fully evaluated to make sure their blood is safe. This is blood bank blood. Transfusion therapy is the safest it has ever been in the practice of medicine. Before blood is taken from a donor, a complete history is taken to make sure that person has no history of diseases nor engages in risky social behavior (examples are intravenous drug use or sexual activity with multiple partners). The donor's travel history is screened to minimize risk of transmitting infections, such as malaria. The donated blood is tested for signs of infectious diseases, such as HIV and hepatitis. The blood is then tested to be sure it is compatible with you in order to minimize the chance of a transfusion reaction. If you or a relative donates blood, this is often done in anticipation of surgery and is not appropriate for emergency situations. It takes many days to process the donated  blood. RISKS AND COMPLICATIONS Although transfusion therapy is very safe and saves many lives, the main dangers of transfusion include:   Getting an infectious disease.  Developing a transfusion reaction. This is an allergic reaction to something in the blood you were given. Every precaution is taken to prevent this. The decision to have a blood transfusion has been considered carefully by your caregiver before blood is given. Blood is not given unless the benefits outweigh the risks. AFTER THE TRANSFUSION  Right after receiving a blood transfusion,  you will usually feel much better and more energetic. This is especially true if your red blood cells have gotten low (anemic). The transfusion raises the level of the red blood cells which carry oxygen, and this usually causes an energy increase.  The nurse administering the transfusion will monitor you carefully for complications. HOME CARE INSTRUCTIONS  No special instructions are needed after a transfusion. You may find your energy is better. Speak with your caregiver about any limitations on activity for underlying diseases you may have. SEEK MEDICAL CARE IF:   Your condition is not improving after your transfusion.  You develop redness or irritation at the intravenous (IV) site. SEEK IMMEDIATE MEDICAL CARE IF:  Any of the following symptoms occur over the next 12 hours:  Shaking chills.  You have a temperature by mouth above 102 F (38.9 C), not controlled by medicine.  Chest, back, or muscle pain.  People around you feel you are not acting correctly or are confused.  Shortness of breath or difficulty breathing.  Dizziness and fainting.  You get a rash or develop hives.  You have a decrease in urine output.  Your urine turns a dark color or changes to pink, red, or brown. Any of the following symptoms occur over the next 10 days:  You have a temperature by mouth above 102 F (38.9 C), not controlled by  medicine.  Shortness of breath.  Weakness after normal activity.  The white part of the eye turns yellow (jaundice).  You have a decrease in the amount of urine or are urinating less often.  Your urine turns a dark color or changes to pink, red, or brown. Document Released: 11/07/2000 Document Revised: 02/02/2012 Document Reviewed: 06/26/2008 [facility] Patient Information 2014 [place], [place].  _______________________________________________________________________

## 2017-03-25 ENCOUNTER — Encounter (HOSPITAL_COMMUNITY): Payer: Self-pay

## 2017-03-25 ENCOUNTER — Telehealth: Payer: Self-pay | Admitting: *Deleted

## 2017-03-25 ENCOUNTER — Encounter (HOSPITAL_COMMUNITY)
Admission: RE | Admit: 2017-03-25 | Discharge: 2017-03-25 | Disposition: A | Discharge status: Home / Self Care | Payer: Medicare Other | Source: Ambulatory Visit | Attending: Orthopedic Surgery | Admitting: Orthopedic Surgery

## 2017-03-25 DIAGNOSIS — M1712 Unilateral primary osteoarthritis, left knee: Secondary | ICD-10-CM | POA: Insufficient documentation

## 2017-03-25 DIAGNOSIS — M25562 Pain in left knee: Secondary | ICD-10-CM | POA: Diagnosis not present

## 2017-03-25 DIAGNOSIS — Z01812 Encounter for preprocedural laboratory examination: Secondary | ICD-10-CM | POA: Diagnosis not present

## 2017-03-25 LAB — BASIC METABOLIC PANEL
ANION GAP: 8 (ref 5–15)
BUN: 25 mg/dL — ABNORMAL HIGH (ref 6–20)
CALCIUM: 9.4 mg/dL (ref 8.9–10.3)
CO2: 27 mmol/L (ref 22–32)
Chloride: 103 mmol/L (ref 101–111)
Creatinine, Ser: 1.41 mg/dL — ABNORMAL HIGH (ref 0.61–1.24)
GFR, EST AFRICAN AMERICAN: 58 mL/min — AB (ref >60)
GFR, EST NON AFRICAN AMERICAN: 50 mL/min — AB (ref >60)
Glucose, Bld: 117 mg/dL — ABNORMAL HIGH (ref 65–99)
Potassium: 4.3 mmol/L (ref 3.5–5.1)
Sodium: 138 mmol/L (ref 135–145)

## 2017-03-25 LAB — SURGICAL PCR SCREEN
MRSA, PCR: NEGATIVE
STAPHYLOCOCCUS AUREUS: NEGATIVE

## 2017-03-25 LAB — CBC
HCT: 40.6 % (ref 39.0–52.0)
HEMOGLOBIN: 13.6 g/dL (ref 13.0–17.0)
MCH: 29.9 pg (ref 26.0–34.0)
MCHC: 33.5 g/dL (ref 30.0–36.0)
MCV: 89.2 fL (ref 78.0–100.0)
Platelets: 253 10*3/uL (ref 150–400)
RBC: 4.55 MIL/uL (ref 4.22–5.81)
RDW: 13 % (ref 11.5–15.5)
WBC: 8.7 10*3/uL (ref 4.0–10.5)

## 2017-03-25 LAB — GLUCOSE, CAPILLARY: GLUCOSE-CAPILLARY: 147 mg/dL — AB (ref 65–99)

## 2017-03-25 NOTE — Telephone Encounter (Addendum)
Pt states he was seen 03/23/2017 and Dr. Jacqualyn Posey was to extend his antibiotics.03/26/2017-I informed pt of Dr. Leigh Aurora orders and escribed orders to [facility].

## 2017-03-25 NOTE — Progress Notes (Signed)
Surgical clearance received from Dr. Virgina Jock, indicated that he wanted an EKG done prior to clearance. Per pt, EKG done on 03-24-17. Pt contacted Dr. Virgina Jock office during the preop appt to have information faxed to PST office. MD's office indicate that they were waiting for Dr. Virgina Jock to confirm final EKG reading. After that, they will send over to PST along with surgical clearance. Pt will contact our office later in the day to verify that information was received.   Addendum: 03-24-17  EKG with office notes on chart. No updated surgical clearance form provided.

## 2017-03-26 LAB — HEMOGLOBIN A1C
Hgb A1c MFr Bld: 8.1 % — ABNORMAL HIGH (ref 4.8–5.6)
MEAN PLASMA GLUCOSE: 186 mg/dL

## 2017-03-26 LAB — ABO/RH: ABO/RH(D): A POS

## 2017-03-26 MED ORDER — CLINDAMYCIN HCL 300 MG PO CAPS: 300.0000 mg | ORAL_CAPSULE | Freq: Three times a day (TID) | ORAL | 0 refills | Status: DC

## 2017-03-26 NOTE — Progress Notes (Signed)
03-25-17 BMP and HGA1C routed to Dr. Alvan Dame for review.

## 2017-03-26 NOTE — Telephone Encounter (Signed)
Clindamycin 300mg  TID x 7 days

## 2017-03-27 NOTE — Progress Notes (Signed)
Received call from [facility] questioning if we had received faxed EKG from their office. Pt had contacted their office, indicating that his surgery was cancelled,and he was under the impression that he thought it was because they had not faxed the information to me. Verified that information was received and was in chart.   Also received a call from pt's wife indicating that her husband had called her indicating that surgery was cancelled because the information from [facility] was not received. Reconfirmed with pt's wife that information was received. Also asked if she had spoken to Dr. Aurea Graff office to verify why surgery was cancelled. She indicated 'not at this time. Advised pt's wife to contact Dr. Aurea Graff office to verify why surgery was cancelled. Pt wife agreeable to contacting Dr. Aurea Graff office.

## 2017-03-29 MED ORDER — DEXTROSE 5 % IV SOLN
3.0000 g | INTRAVENOUS | Status: AC
  Administered 2017-03-30: 3 g via INTRAVENOUS
  Filled 2017-03-29: qty 3

## 2017-03-30 ENCOUNTER — Encounter (HOSPITAL_COMMUNITY)
Admission: RE | Disposition: A | Discharge status: Home / Self Care | Payer: Self-pay | Source: Ambulatory Visit | Attending: Orthopedic Surgery

## 2017-03-30 ENCOUNTER — Encounter (HOSPITAL_COMMUNITY): Payer: Self-pay

## 2017-03-30 ENCOUNTER — Inpatient Hospital Stay (HOSPITAL_COMMUNITY): Payer: Non-veteran care | Admitting: Anesthesiology

## 2017-03-30 ENCOUNTER — Observation Stay (HOSPITAL_COMMUNITY)
Admission: RE | Admit: 2017-03-30 | Discharge: 2017-03-31 | Disposition: A | Discharge status: Home / Self Care | Payer: Non-veteran care | Source: Ambulatory Visit | Attending: Orthopedic Surgery | Admitting: Orthopedic Surgery

## 2017-03-30 DIAGNOSIS — I10 Essential (primary) hypertension: Secondary | ICD-10-CM | POA: Insufficient documentation

## 2017-03-30 DIAGNOSIS — K449 Diaphragmatic hernia without obstruction or gangrene: Secondary | ICD-10-CM | POA: Diagnosis not present

## 2017-03-30 DIAGNOSIS — M109 Gout, unspecified: Secondary | ICD-10-CM | POA: Insufficient documentation

## 2017-03-30 DIAGNOSIS — E669 Obesity, unspecified: Secondary | ICD-10-CM | POA: Diagnosis not present

## 2017-03-30 DIAGNOSIS — H8110 Benign paroxysmal vertigo, unspecified ear: Secondary | ICD-10-CM | POA: Insufficient documentation

## 2017-03-30 DIAGNOSIS — M25461 Effusion, right knee: Secondary | ICD-10-CM | POA: Insufficient documentation

## 2017-03-30 DIAGNOSIS — Z87891 Personal history of nicotine dependence: Secondary | ICD-10-CM | POA: Insufficient documentation

## 2017-03-30 DIAGNOSIS — Z7982 Long term (current) use of aspirin: Secondary | ICD-10-CM | POA: Insufficient documentation

## 2017-03-30 DIAGNOSIS — L97529 Non-pressure chronic ulcer of other part of left foot with unspecified severity: Secondary | ICD-10-CM | POA: Diagnosis not present

## 2017-03-30 DIAGNOSIS — Z96659 Presence of unspecified artificial knee joint: Secondary | ICD-10-CM

## 2017-03-30 DIAGNOSIS — G4733 Obstructive sleep apnea (adult) (pediatric): Secondary | ICD-10-CM | POA: Insufficient documentation

## 2017-03-30 DIAGNOSIS — R251 Tremor, unspecified: Secondary | ICD-10-CM | POA: Diagnosis not present

## 2017-03-30 DIAGNOSIS — Z8673 Personal history of transient ischemic attack (TIA), and cerebral infarction without residual deficits: Secondary | ICD-10-CM | POA: Insufficient documentation

## 2017-03-30 DIAGNOSIS — Z6834 Body mass index (BMI) 34.0-34.9, adult: Secondary | ICD-10-CM | POA: Insufficient documentation

## 2017-03-30 DIAGNOSIS — Z9841 Cataract extraction status, right eye: Secondary | ICD-10-CM | POA: Diagnosis not present

## 2017-03-30 DIAGNOSIS — K219 Gastro-esophageal reflux disease without esophagitis: Secondary | ICD-10-CM | POA: Diagnosis not present

## 2017-03-30 DIAGNOSIS — Z833 Family history of diabetes mellitus: Secondary | ICD-10-CM | POA: Diagnosis not present

## 2017-03-30 DIAGNOSIS — E785 Hyperlipidemia, unspecified: Secondary | ICD-10-CM | POA: Insufficient documentation

## 2017-03-30 DIAGNOSIS — Z794 Long term (current) use of insulin: Secondary | ICD-10-CM | POA: Insufficient documentation

## 2017-03-30 DIAGNOSIS — Z85820 Personal history of malignant melanoma of skin: Secondary | ICD-10-CM | POA: Insufficient documentation

## 2017-03-30 DIAGNOSIS — E1140 Type 2 diabetes mellitus with diabetic neuropathy, unspecified: Secondary | ICD-10-CM | POA: Insufficient documentation

## 2017-03-30 DIAGNOSIS — Z79899 Other long term (current) drug therapy: Secondary | ICD-10-CM | POA: Insufficient documentation

## 2017-03-30 DIAGNOSIS — Z820 Family history of epilepsy and other diseases of the nervous system: Secondary | ICD-10-CM | POA: Insufficient documentation

## 2017-03-30 DIAGNOSIS — M659 Synovitis and tenosynovitis, unspecified: Secondary | ICD-10-CM | POA: Diagnosis not present

## 2017-03-30 DIAGNOSIS — Z8249 Family history of ischemic heart disease and other diseases of the circulatory system: Secondary | ICD-10-CM | POA: Insufficient documentation

## 2017-03-30 DIAGNOSIS — H47012 Ischemic optic neuropathy, left eye: Secondary | ICD-10-CM | POA: Insufficient documentation

## 2017-03-30 DIAGNOSIS — E11621 Type 2 diabetes mellitus with foot ulcer: Secondary | ICD-10-CM | POA: Diagnosis not present

## 2017-03-30 DIAGNOSIS — Z9842 Cataract extraction status, left eye: Secondary | ICD-10-CM | POA: Insufficient documentation

## 2017-03-30 DIAGNOSIS — Z96652 Presence of left artificial knee joint: Secondary | ICD-10-CM

## 2017-03-30 DIAGNOSIS — M1711 Unilateral primary osteoarthritis, right knee: Principal | ICD-10-CM | POA: Insufficient documentation

## 2017-03-30 DIAGNOSIS — M1712 Unilateral primary osteoarthritis, left knee: Secondary | ICD-10-CM | POA: Diagnosis present

## 2017-03-30 HISTORY — PX: TOTAL KNEE ARTHROPLASTY: SHX125

## 2017-03-30 LAB — TYPE AND SCREEN
ABO/RH(D): A POS
ANTIBODY SCREEN: NEGATIVE

## 2017-03-30 LAB — GLUCOSE, CAPILLARY
GLUCOSE-CAPILLARY: 152 mg/dL — AB (ref 65–99)
Glucose-Capillary: 157 mg/dL — ABNORMAL HIGH (ref 65–99)
Glucose-Capillary: 212 mg/dL — ABNORMAL HIGH (ref 65–99)
Glucose-Capillary: 206 mg/dL — ABNORMAL HIGH (ref 65–99)

## 2017-03-30 SURGERY — ARTHROPLASTY, KNEE, TOTAL
Anesthesia: Spinal | Site: Knee | Laterality: Left

## 2017-03-30 MED ORDER — FERROUS SULFATE 325 (65 FE) MG PO TABS: 325.0000 mg | ORAL_TABLET | Freq: Three times a day (TID) | ORAL | Status: DC

## 2017-03-30 MED ORDER — LIP MEDEX EX OINT
TOPICAL_OINTMENT | CUTANEOUS | Status: AC
  Filled 2017-03-30: qty 7

## 2017-03-30 MED ORDER — SODIUM CHLORIDE 0.9 % IR SOLN
Status: DC | PRN
  Administered 2017-03-30: 1000 mL

## 2017-03-30 MED ORDER — HYDROMORPHONE HCL 1 MG/ML IJ SOLN
0.5000 mg | INTRAMUSCULAR | Status: DC | PRN
  Administered 2017-03-30: 0.5 mg via INTRAVENOUS
  Administered 2017-03-30 (×3): 1 mg via INTRAVENOUS
  Filled 2017-03-30 (×4): qty 1

## 2017-03-30 MED ORDER — PROPOFOL 10 MG/ML IV BOLUS
INTRAVENOUS | Status: DC | PRN
  Administered 2017-03-30: 30 mg via INTRAVENOUS

## 2017-03-30 MED ORDER — INSULIN GLARGINE 100 UNIT/ML ~~LOC~~ SOLN
70.0000 [IU] | Freq: Two times a day (BID) | SUBCUTANEOUS | Status: DC
  Administered 2017-03-30 – 2017-03-31 (×2): 70 [IU] via SUBCUTANEOUS
  Filled 2017-03-30 (×3): qty 0.7

## 2017-03-30 MED ORDER — DEXAMETHASONE SODIUM PHOSPHATE 10 MG/ML IJ SOLN
10.0000 mg | Freq: Once | INTRAMUSCULAR | Status: AC
  Administered 2017-03-31: 10 mg via INTRAVENOUS
  Filled 2017-03-30: qty 1

## 2017-03-30 MED ORDER — ONDANSETRON HCL 4 MG/2ML IJ SOLN: 4.0000 mg | Freq: Four times a day (QID) | INTRAMUSCULAR | Status: DC | PRN

## 2017-03-30 MED ORDER — BUPIVACAINE HCL (PF) 0.25 % IJ SOLN
INTRAMUSCULAR | Status: AC
  Filled 2017-03-30: qty 30

## 2017-03-30 MED ORDER — HYDROCODONE-ACETAMINOPHEN 7.5-325 MG PO TABS
1.0000 | ORAL_TABLET | ORAL | Status: DC
  Administered 2017-03-30 – 2017-03-31 (×7): 2 via ORAL
  Filled 2017-03-30 (×7): qty 2

## 2017-03-30 MED ORDER — METFORMIN HCL 500 MG PO TABS
1000.0000 mg | ORAL_TABLET | Freq: Two times a day (BID) | ORAL | Status: DC
  Administered 2017-03-30 – 2017-03-31 (×2): 1000 mg via ORAL
  Filled 2017-03-30 (×2): qty 2

## 2017-03-30 MED ORDER — ASPIRIN 81 MG PO CHEW: 81.0000 mg | CHEWABLE_TABLET | Freq: Two times a day (BID) | ORAL | 0 refills | Status: DC

## 2017-03-30 MED ORDER — ALUM & MAG HYDROXIDE-SIMETH 200-200-20 MG/5ML PO SUSP: 15.0000 mL | ORAL | Status: DC | PRN

## 2017-03-30 MED ORDER — FERROUS SULFATE 325 (65 FE) MG PO TABS
325.0000 mg | ORAL_TABLET | Freq: Three times a day (TID) | ORAL | Status: DC
  Administered 2017-03-30 – 2017-03-31 (×3): 325 mg via ORAL
  Filled 2017-03-30 (×3): qty 1

## 2017-03-30 MED ORDER — ONDANSETRON HCL 4 MG PO TABS: 4.0000 mg | ORAL_TABLET | Freq: Four times a day (QID) | ORAL | Status: DC | PRN

## 2017-03-30 MED ORDER — PRIMIDONE 50 MG PO TABS
25.0000 mg | ORAL_TABLET | Freq: Every day | ORAL | Status: DC
  Administered 2017-03-30: 25 mg via ORAL
  Filled 2017-03-30 (×2): qty 0.5

## 2017-03-30 MED ORDER — POLYETHYLENE GLYCOL 3350 17 G PO PACK
17.0000 g | PACK | Freq: Two times a day (BID) | ORAL | Status: DC
  Administered 2017-03-31: 17 g via ORAL
  Filled 2017-03-30: qty 1

## 2017-03-30 MED ORDER — DIPHENHYDRAMINE HCL 25 MG PO CAPS: 25.0000 mg | ORAL_CAPSULE | Freq: Four times a day (QID) | ORAL | Status: DC | PRN

## 2017-03-30 MED ORDER — MAGNESIUM CITRATE PO SOLN: 1.0000 | Freq: Once | ORAL | Status: DC | PRN

## 2017-03-30 MED ORDER — SODIUM CHLORIDE 0.9 % IJ SOLN
INTRAMUSCULAR | Status: DC | PRN
  Administered 2017-03-30: 30 mL

## 2017-03-30 MED ORDER — LIDOCAINE 2% (20 MG/ML) 5 ML SYRINGE
INTRAMUSCULAR | Status: DC | PRN
  Administered 2017-03-30: 40 mg via INTRAVENOUS

## 2017-03-30 MED ORDER — ONDANSETRON HCL 4 MG/2ML IJ SOLN: 4.0000 mg | Freq: Once | INTRAMUSCULAR | Status: DC | PRN

## 2017-03-30 MED ORDER — PROPOFOL 10 MG/ML IV BOLUS
INTRAVENOUS | Status: AC
  Filled 2017-03-30: qty 60

## 2017-03-30 MED ORDER — KETOROLAC TROMETHAMINE 30 MG/ML IJ SOLN
INTRAMUSCULAR | Status: AC
  Filled 2017-03-30: qty 1

## 2017-03-30 MED ORDER — MIDAZOLAM HCL 5 MG/5ML IJ SOLN
INTRAMUSCULAR | Status: DC | PRN
  Administered 2017-03-30 (×2): 1 mg via INTRAVENOUS

## 2017-03-30 MED ORDER — ESCITALOPRAM OXALATE 20 MG PO TABS
20.0000 mg | ORAL_TABLET | Freq: Every day | ORAL | Status: DC
  Administered 2017-03-30: 20 mg via ORAL
  Filled 2017-03-30: qty 1

## 2017-03-30 MED ORDER — LUTEIN-ZEAXANTHIN 25-5 MG PO CAPS: 1.0000 | ORAL_CAPSULE | Freq: Every day | ORAL | Status: DC

## 2017-03-30 MED ORDER — HYDROCHLOROTHIAZIDE 25 MG PO TABS
25.0000 mg | ORAL_TABLET | Freq: Every day | ORAL | Status: DC
  Administered 2017-03-31: 25 mg via ORAL
  Filled 2017-03-30: qty 1

## 2017-03-30 MED ORDER — DOCUSATE SODIUM 100 MG PO CAPS
100.0000 mg | ORAL_CAPSULE | Freq: Two times a day (BID) | ORAL | Status: DC
Start: 2017-03-30 — End: 2017-03-31
  Administered 2017-03-30 – 2017-03-31 (×2): 100 mg via ORAL
  Filled 2017-03-30 (×2): qty 1

## 2017-03-30 MED ORDER — ONDANSETRON HCL 4 MG/2ML IJ SOLN
INTRAMUSCULAR | Status: AC
  Filled 2017-03-30: qty 8

## 2017-03-30 MED ORDER — POLYETHYLENE GLYCOL 3350 17 G PO PACK: 17.0000 g | PACK | Freq: Two times a day (BID) | ORAL | 0 refills | Status: DC

## 2017-03-30 MED ORDER — INSULIN ASPART 100 UNIT/ML ~~LOC~~ SOLN
0.0000 [IU] | Freq: Three times a day (TID) | SUBCUTANEOUS | Status: DC
  Administered 2017-03-31: 2 [IU] via SUBCUTANEOUS
  Administered 2017-03-31: 3 [IU] via SUBCUTANEOUS
  Filled 2017-03-30: qty 1

## 2017-03-30 MED ORDER — FENTANYL CITRATE (PF) 100 MCG/2ML IJ SOLN
INTRAMUSCULAR | Status: AC
  Filled 2017-03-30: qty 2

## 2017-03-30 MED ORDER — BUPIVACAINE IN DEXTROSE 0.75-8.25 % IT SOLN
INTRATHECAL | Status: DC | PRN
  Administered 2017-03-30: 2 mL via INTRATHECAL

## 2017-03-30 MED ORDER — FENOFIBRATE 54 MG PO TABS
54.0000 mg | ORAL_TABLET | Freq: Every day | ORAL | Status: DC
  Administered 2017-03-31: 54 mg via ORAL
  Filled 2017-03-30: qty 1

## 2017-03-30 MED ORDER — GLIMEPIRIDE 4 MG PO TABS
4.0000 mg | ORAL_TABLET | Freq: Two times a day (BID) | ORAL | Status: DC
  Administered 2017-03-30 – 2017-03-31 (×2): 4 mg via ORAL
  Filled 2017-03-30 (×3): qty 1

## 2017-03-30 MED ORDER — BUPIVACAINE HCL (PF) 0.25 % IJ SOLN
INTRAMUSCULAR | Status: DC | PRN
  Administered 2017-03-30: 30 mL

## 2017-03-30 MED ORDER — METOCLOPRAMIDE HCL 5 MG/ML IJ SOLN: 5.0000 mg | Freq: Three times a day (TID) | INTRAMUSCULAR | Status: DC | PRN

## 2017-03-30 MED ORDER — CEFAZOLIN SODIUM-DEXTROSE 2-4 GM/100ML-% IV SOLN
2.0000 g | Freq: Four times a day (QID) | INTRAVENOUS | Status: AC
  Administered 2017-03-30 (×2): 2 g via INTRAVENOUS
  Filled 2017-03-30 (×2): qty 100

## 2017-03-30 MED ORDER — SODIUM CHLORIDE 0.9 % IV SOLN
INTRAVENOUS | Status: DC
  Administered 2017-03-30 – 2017-03-31 (×2): via INTRAVENOUS

## 2017-03-30 MED ORDER — BISACODYL 10 MG RE SUPP: 10.0000 mg | Freq: Every day | RECTAL | Status: DC | PRN

## 2017-03-30 MED ORDER — TRANEXAMIC ACID 1000 MG/10ML IV SOLN
1000.0000 mg | INTRAVENOUS | Status: AC
  Administered 2017-03-30: 1000 mg via INTRAVENOUS
  Filled 2017-03-30: qty 1100

## 2017-03-30 MED ORDER — ATORVASTATIN CALCIUM 40 MG PO TABS
40.0000 mg | ORAL_TABLET | Freq: Every day | ORAL | Status: DC
  Administered 2017-03-30: 40 mg via ORAL
  Filled 2017-03-30: qty 1

## 2017-03-30 MED ORDER — PHENOL 1.4 % MT LIQD: 1.0000 | OROMUCOSAL | Status: DC | PRN

## 2017-03-30 MED ORDER — LACTATED RINGERS IV SOLN
INTRAVENOUS | Status: DC | PRN
  Administered 2017-03-30 (×2): via INTRAVENOUS
  Administered 2017-03-30: 1000 mL via INTRAVENOUS

## 2017-03-30 MED ORDER — CHLORHEXIDINE GLUCONATE 4 % EX LIQD: 60.0000 mL | Freq: Once | CUTANEOUS | Status: DC

## 2017-03-30 MED ORDER — METHOCARBAMOL 1000 MG/10ML IJ SOLN
500.0000 mg | Freq: Four times a day (QID) | INTRAVENOUS | Status: DC | PRN
  Administered 2017-03-30: 500 mg via INTRAVENOUS
  Filled 2017-03-30: qty 550

## 2017-03-30 MED ORDER — LIDOCAINE 2% (20 MG/ML) 5 ML SYRINGE
INTRAMUSCULAR | Status: AC
  Filled 2017-03-30: qty 25

## 2017-03-30 MED ORDER — METHOCARBAMOL 500 MG PO TABS: 500.0000 mg | ORAL_TABLET | Freq: Four times a day (QID) | ORAL | 0 refills | Status: DC | PRN

## 2017-03-30 MED ORDER — HYDROCODONE-ACETAMINOPHEN 7.5-325 MG PO TABS: 1.0000 | ORAL_TABLET | ORAL | 0 refills | Status: DC | PRN

## 2017-03-30 MED ORDER — BUPIVACAINE-EPINEPHRINE (PF) 0.5% -1:200000 IJ SOLN
INTRAMUSCULAR | Status: DC | PRN
  Administered 2017-03-30: 20 mL via PERINEURAL

## 2017-03-30 MED ORDER — LOSARTAN POTASSIUM 50 MG PO TABS
100.0000 mg | ORAL_TABLET | Freq: Every day | ORAL | Status: DC
  Administered 2017-03-31: 100 mg via ORAL
  Filled 2017-03-30: qty 2

## 2017-03-30 MED ORDER — DULAGLUTIDE 1.5 MG/0.5ML ~~LOC~~ SOAJ: 1.5000 mg | SUBCUTANEOUS | Status: DC

## 2017-03-30 MED ORDER — PROPOFOL 500 MG/50ML IV EMUL
INTRAVENOUS | Status: DC | PRN
  Administered 2017-03-30: 65 ug/kg/min via INTRAVENOUS

## 2017-03-30 MED ORDER — HYDROMORPHONE HCL 1 MG/ML IJ SOLN
INTRAMUSCULAR | Status: AC
  Administered 2017-03-30: 0.5 mg via INTRAVENOUS
  Filled 2017-03-30: qty 0.5

## 2017-03-30 MED ORDER — METHOCARBAMOL 500 MG PO TABS
500.0000 mg | ORAL_TABLET | Freq: Four times a day (QID) | ORAL | Status: DC | PRN
  Administered 2017-03-30 – 2017-03-31 (×3): 500 mg via ORAL
  Filled 2017-03-30 (×3): qty 1

## 2017-03-30 MED ORDER — ONDANSETRON HCL 4 MG/2ML IJ SOLN
INTRAMUSCULAR | Status: DC | PRN
  Administered 2017-03-30: 4 mg via INTRAVENOUS

## 2017-03-30 MED ORDER — FENTANYL CITRATE (PF) 100 MCG/2ML IJ SOLN
INTRAMUSCULAR | Status: DC | PRN
  Administered 2017-03-30 (×2): 50 ug via INTRAVENOUS

## 2017-03-30 MED ORDER — ASPIRIN 81 MG PO CHEW
81.0000 mg | CHEWABLE_TABLET | Freq: Two times a day (BID) | ORAL | Status: DC
  Administered 2017-03-30 – 2017-03-31 (×2): 81 mg via ORAL
  Filled 2017-03-30 (×2): qty 1

## 2017-03-30 MED ORDER — HYDROMORPHONE HCL 1 MG/ML IJ SOLN
0.5000 mg | Freq: Once | INTRAMUSCULAR | Status: AC
  Administered 2017-03-30: 0.5 mg via INTRAVENOUS

## 2017-03-30 MED ORDER — DEXAMETHASONE SODIUM PHOSPHATE 10 MG/ML IJ SOLN
INTRAMUSCULAR | Status: AC
  Filled 2017-03-30: qty 2

## 2017-03-30 MED ORDER — DOCUSATE SODIUM 100 MG PO CAPS: 100.0000 mg | ORAL_CAPSULE | Freq: Two times a day (BID) | ORAL | 0 refills | Status: DC

## 2017-03-30 MED ORDER — DEXAMETHASONE SODIUM PHOSPHATE 10 MG/ML IJ SOLN
10.0000 mg | Freq: Once | INTRAMUSCULAR | Status: AC
  Administered 2017-03-30: 6 mg via INTRAVENOUS

## 2017-03-30 MED ORDER — CELECOXIB 200 MG PO CAPS
200.0000 mg | ORAL_CAPSULE | Freq: Two times a day (BID) | ORAL | Status: DC
  Administered 2017-03-30 – 2017-03-31 (×2): 200 mg via ORAL
  Filled 2017-03-30 (×2): qty 1

## 2017-03-30 MED ORDER — FENTANYL CITRATE (PF) 100 MCG/2ML IJ SOLN
25.0000 ug | INTRAMUSCULAR | Status: DC | PRN
  Administered 2017-03-30: 50 ug via INTRAVENOUS
  Administered 2017-03-30: 25 ug via INTRAVENOUS
  Administered 2017-03-30: 50 ug via INTRAVENOUS
  Administered 2017-03-30: 25 ug via INTRAVENOUS

## 2017-03-30 MED ORDER — SODIUM CHLORIDE 0.9 % IJ SOLN
INTRAMUSCULAR | Status: AC
  Filled 2017-03-30: qty 50

## 2017-03-30 MED ORDER — KETOROLAC TROMETHAMINE 30 MG/ML IJ SOLN
INTRAMUSCULAR | Status: DC | PRN
  Administered 2017-03-30: 30 mg

## 2017-03-30 MED ORDER — MIDAZOLAM HCL 2 MG/2ML IJ SOLN
INTRAMUSCULAR | Status: AC
  Filled 2017-03-30: qty 2

## 2017-03-30 MED ORDER — MENTHOL 3 MG MT LOZG: 1.0000 | LOZENGE | OROMUCOSAL | Status: DC | PRN

## 2017-03-30 MED ORDER — METOCLOPRAMIDE HCL 5 MG PO TABS
5.0000 mg | ORAL_TABLET | Freq: Three times a day (TID) | ORAL | Status: DC | PRN
Start: 2017-03-30 — End: 2017-03-31

## 2017-03-30 MED ORDER — FENTANYL CITRATE (PF) 100 MCG/2ML IJ SOLN
INTRAMUSCULAR | Status: AC
  Administered 2017-03-30: 50 ug via INTRAVENOUS
  Filled 2017-03-30: qty 2

## 2017-03-30 SURGICAL SUPPLY — 49 items
BAG DECANTER FOR FLEXI CONT (MISCELLANEOUS) IMPLANT
BAG SPEC THK2 15X12 ZIP CLS (MISCELLANEOUS)
BAG ZIPLOCK 12X15 (MISCELLANEOUS) IMPLANT
BANDAGE ACE 6X5 VEL STRL LF (GAUZE/BANDAGES/DRESSINGS) ×3 IMPLANT
BLADE SAW SGTL 11.0X1.19X90.0M (BLADE) IMPLANT
BLADE SAW SGTL 13.0X1.19X90.0M (BLADE) ×3 IMPLANT
BONE CEMENT GENTAMICIN (Cement) ×6 IMPLANT
BOWL SMART MIX CTS (DISPOSABLE) ×3 IMPLANT
CAPT KNEE TOTAL 3 ATTUNE ×3 IMPLANT
CEMENT BONE GENTAMICIN 40 (Cement) ×2 IMPLANT
COVER SURGICAL LIGHT HANDLE (MISCELLANEOUS) ×3 IMPLANT
CUFF TOURN SGL QUICK 34 (TOURNIQUET CUFF) ×3
CUFF TRNQT CYL 34X4X40X1 (TOURNIQUET CUFF) ×1 IMPLANT
DECANTER SPIKE VIAL GLASS SM (MISCELLANEOUS) ×3 IMPLANT
DERMABOND ADVANCED (GAUZE/BANDAGES/DRESSINGS) ×2
DERMABOND ADVANCED .7 DNX12 (GAUZE/BANDAGES/DRESSINGS) ×1 IMPLANT
DRAPE U-SHAPE 47X51 STRL (DRAPES) ×3 IMPLANT
DRESSING AQUACEL AG SP 3.5X10 (GAUZE/BANDAGES/DRESSINGS) ×1 IMPLANT
DRSG AQUACEL AG SP 3.5X10 (GAUZE/BANDAGES/DRESSINGS) ×3
DURAPREP 26ML APPLICATOR (WOUND CARE) ×6 IMPLANT
ELECT REM PT RETURN 15FT ADLT (MISCELLANEOUS) ×3 IMPLANT
GLOVE BIOGEL M 7.0 STRL (GLOVE) IMPLANT
GLOVE BIOGEL PI IND STRL 7.0 (GLOVE) ×4 IMPLANT
GLOVE BIOGEL PI IND STRL 7.5 (GLOVE) ×1 IMPLANT
GLOVE BIOGEL PI IND STRL 8.5 (GLOVE) ×1 IMPLANT
GLOVE BIOGEL PI INDICATOR 7.0 (GLOVE) ×8
GLOVE BIOGEL PI INDICATOR 7.5 (GLOVE) ×2
GLOVE BIOGEL PI INDICATOR 8.5 (GLOVE) ×2
GLOVE ECLIPSE 8.0 STRL XLNG CF (GLOVE) ×3 IMPLANT
GLOVE ORTHO TXT STRL SZ7.5 (GLOVE) ×6 IMPLANT
GOWN STRL REUS W/TWL LRG LVL3 (GOWN DISPOSABLE) ×6 IMPLANT
GOWN STRL REUS W/TWL XL LVL3 (GOWN DISPOSABLE) ×6 IMPLANT
HANDPIECE INTERPULSE COAX TIP (DISPOSABLE) ×2
MANIFOLD NEPTUNE II (INSTRUMENTS) ×3 IMPLANT
PACK TOTAL KNEE CUSTOM (KITS) ×3 IMPLANT
POSITIONER SURGICAL ARM (MISCELLANEOUS) ×3 IMPLANT
SET HNDPC FAN SPRY TIP SCT (DISPOSABLE) ×1 IMPLANT
SET PAD KNEE POSITIONER (MISCELLANEOUS) ×3 IMPLANT
SUT MNCRL AB 4-0 PS2 18 (SUTURE) ×3 IMPLANT
SUT STRATAFIX 0 PDS 27 VIOLET (SUTURE) ×3
SUT VIC AB 1 CT1 36 (SUTURE) ×3 IMPLANT
SUT VIC AB 2-0 CT1 27 (SUTURE) ×9
SUT VIC AB 2-0 CT1 TAPERPNT 27 (SUTURE) ×3 IMPLANT
SUTURE STRATFX 0 PDS 27 VIOLET (SUTURE) ×1 IMPLANT
SYR 50ML LL SCALE MARK (SYRINGE) IMPLANT
TRAY FOLEY W/METER SILVER 16FR (SET/KITS/TRAYS/PACK) ×3 IMPLANT
WATER STERILE IRR 1500ML POUR (IV SOLUTION) ×6 IMPLANT
WRAP KNEE MAXI GEL POST OP (GAUZE/BANDAGES/DRESSINGS) ×3 IMPLANT
YANKAUER SUCT BULB TIP 10FT TU (MISCELLANEOUS) ×3 IMPLANT

## 2017-03-30 NOTE — Anesthesia Postprocedure Evaluation (Signed)
Anesthesia Post Note  Patient: Donaldson A Arutyunyan  Procedure(s) Performed: Procedure(s) (LRB): LEFT TOTAL KNEE ARTHROPLASTY (Left)  Patient location during evaluation: PACU Anesthesia Type: Spinal Level of consciousness: oriented and awake and alert Pain management: pain level controlled Vital Signs Assessment: post-procedure vital signs reviewed and stable Respiratory status: spontaneous breathing, respiratory function stable and patient connected to nasal cannula oxygen Cardiovascular status: blood pressure returned to baseline and stable Postop Assessment: no headache, spinal receding, no signs of nausea or vomiting, no backache and patient able to bend at knees Anesthetic complications: no       Last Vitals:  Vitals:   03/30/17 1030 03/30/17 1045  BP: 128/74 137/78  Pulse: 76 73  Resp: 15 16  Temp: 36.7 C 36.8 C    Last Pain:  Vitals:   03/30/17 1138  TempSrc:   PainSc: [place]

## 2017-03-30 NOTE — Evaluation (Signed)
Physical Therapy Evaluation Patient Details Name: Joshua [name] MRN: [id number] DOB: 10/04/1951 Today's Date: 03/30/2017   History of Present Illness  L TKA  Clinical Impression  *The patient ambulated x 50'. Very sweaty/diaphoretic , not dizzy, but feels weak. BP 161/87.  Pt admitted with above diagnosis. Pt currently with functional limitations due to the deficits listed below (see PT Problem List). Pt will benefit from skilled PT to increase their independence and safety with mobility to allow discharge to the venue listed below.       Follow Up Recommendations  HHPT vs OP, uncertain.    Equipment Recommendations    none   Recommendations for Other Services       Precautions / Restrictions Precautions Precautions: Fall;Knee      Mobility  Bed Mobility Overal bed mobility: Needs Assistance Bed Mobility: Supine to Sit;Sit to Supine     Supine to sit: Min assist Sit to supine: Min assist   General bed mobility comments: assist with left leg, cues for technique  Transfers Overall transfer level: Needs assistance Equipment used: Rolling walker (2 wheeled) Transfers: Sit to/from Stand Sit to Stand: Min assist;From elevated surface         General transfer comment: cues for hand and left leg position  Ambulation/Gait Ambulation/Gait assistance: Min assist;+2 safety/equipment Ambulation Distance (Feet): 50 Feet Assistive device: Rolling walker (2 wheeled) Gait Pattern/deviations: Step-to pattern;Step-through pattern     General Gait Details: cues for sequence. Patient with significant diaphoresis, not dizzy but feels weashed out. BO 161/87, stas 92% RA , HR 72  Stairs            Wheelchair Mobility    Modified Rankin (Stroke Patients Only)       Balance                                             Pertinent Vitals/Pain Pain Assessment: 0-10 Pain Score: 8  Pain Location: left knee Pain Descriptors / Indicators:  Aching;Discomfort;Grimacing;Guarding Pain Intervention(s): Limited activity within patient's tolerance;Premedicated before session;Repositioned;Ice applied;Monitored during session    Home Living Family/patient expects to be discharged to:: Private residence Living Arrangements: Spouse/significant other Available Help at Discharge: Family Type of Home: House Home Access: Stairs to enter Entrance Stairs-Rails: Can reach both Entrance Stairs-Number of Steps: [place]: One [place]: [place] - 2 wheels;Shower seat - built in;Grab bars - tub/shower      Prior Function Level of Independence: Independent               Hand Dominance        Extremity/Trunk Assessment   Upper Extremity Assessment Upper Extremity Assessment: Overall WFL for tasks assessed    Lower Extremity Assessment Lower Extremity Assessment: LLE deficits/detail LLE Deficits / Details: able to perform SLR, knee flexion 5-40    Cervical / Trunk Assessment Cervical / Trunk Assessment: Normal  Communication   Communication: No difficulties  Cognition Arousal/Alertness: Awake/alert Behavior During Therapy: WFL for tasks assessed/performed Overall Cognitive Status: Within Functional Limits for tasks assessed                                        General Comments      Exercises     Assessment/Plan    PT Assessment Patient  needs continued PT services  PT Problem List Decreased strength;Decreased range of motion;Decreased activity tolerance;Decreased mobility;Decreased knowledge of precautions;Decreased safety awareness;Decreased knowledge of use of DME;Pain       PT Treatment Interventions DME instruction;Gait training;Stair training;Functional mobility training;Therapeutic activities;Therapeutic exercise;Patient/family education    PT Goals (Current goals can be found in the Care Plan section)  Acute Rehab PT Goals Patient Stated Goal: to get the other knee  done PT Goal Formulation: With patient Time For Goal Achievement: 04/02/17 Potential to Achieve Goals: Good    Frequency 7X/week   Barriers to discharge        Co-evaluation               AM-PAC PT "6 Clicks" Daily Activity  Outcome Measure Difficulty turning over in bed (including adjusting bedclothes, sheets and blankets)?: A Little Difficulty moving from lying on back to sitting on the side of the bed? : A Little Difficulty sitting down on and standing up from a chair with arms (e.g., wheelchair, bedside commode, etc,.)?: A Little Help needed moving to and from a bed to chair (including a wheelchair)?: A Little Help needed walking in hospital room?: A Little Help needed climbing 3-5 steps with a railing? : A Lot 6 Click Score: 14    End of Session   Activity Tolerance: Patient tolerated treatment well Patient left: in bed;with call bell/phone within reach Nurse Communication: Mobility status PT Visit Diagnosis: Difficulty in walking, not elsewhere classified (R26.2)    Time: 1453-1520 PT Time Calculation (min) (ACUTE ONLY): 27 min   Charges:   PT Evaluation $PT Eval Low Complexity: 1 Procedure PT Treatments $Gait Training: 8-22 mins   PT G Codes:   PT G-Codes **NOT FOR INPATIENT CLASS** Functional Assessment Tool Used: AM-PAC 6 Clicks Basic Mobility;Clinical judgement Functional Limitation: Mobility: Walking and moving around Mobility: Walking and Moving Around Current Status (D1497): At least 40 percent but less than 60 percent impaired, limited or restricted Mobility: Walking and Moving Around Goal Status [phone]): At least 1 percent but less than 20 percent impaired, limited or restricted    Tresa Endo PT [place], [place] 03/30/2017, 3:55 PM

## 2017-03-30 NOTE — Anesthesia Preprocedure Evaluation (Signed)
Anesthesia Evaluation  Patient identified by MRN, date of birth, ID band Patient awake    Reviewed: Allergy & Precautions, NPO status , Patient's Chart, lab work & pertinent test results  Airway Mallampati: II  TM Distance: >3 FB Neck ROM: Full    Dental  (+) Teeth Intact, Dental Advisory Given   Pulmonary sleep apnea and Continuous Positive Airway Pressure Ventilation , former smoker,    Pulmonary exam normal breath sounds clear to auscultation       Cardiovascular hypertension, Pt. on medications Normal cardiovascular exam Rhythm:Regular Rate:Normal     Neuro/Psych CVA negative psych ROS   GI/Hepatic Neg liver ROS, GERD  Medicated,  Endo/Other  diabetesObesity   Renal/GU Renal InsufficiencyRenal disease     Musculoskeletal  (+) Arthritis , Osteoarthritis,    Abdominal   Peds  Hematology negative hematology ROS (+) Plt 253k   Anesthesia Other Findings Day of surgery medications reviewed with the patient.  Reproductive/Obstetrics                             Anesthesia Physical Anesthesia Plan  ASA: III  Anesthesia Plan: Spinal   Post-op Pain Management:  Regional for Post-op pain   Induction:   Airway Management Planned:   Additional Equipment:   Intra-op Plan:   Post-operative Plan:   Informed Consent: I have reviewed the patients History and Physical, chart, labs and discussed the procedure including the risks, benefits and alternatives for the proposed anesthesia with the patient or authorized representative who has indicated his/her understanding and acceptance.   Dental advisory given  Plan Discussed with: CRNA, Anesthesiologist and Surgeon  Anesthesia Plan Comments: (Discussed risks and benefits of and differences between spinal and general. Discussed risks of spinal including headache, backache, failure, bleeding, infection, and nerve damage. Patient consents to  spinal. Questions answered. Coagulation studies and platelet count acceptable.)        Anesthesia Quick Evaluation

## 2017-03-30 NOTE — Op Note (Signed)
NAME:  Joshua [name] RECORD NO.:  [id number]                             FACILITY:  [facility]      PHYSICIAN:  Pietro Cassis. Alvan Dame, M.D.  DATE OF BIRTH:  26-Oct-1951      DATE OF PROCEDURE:  03/30/2017                                     OPERATIVE REPORT         PREOPERATIVE DIAGNOSIS:  Left knee osteoarthritis.      POSTOPERATIVE DIAGNOSIS:  Left knee osteoarthritis.      FINDINGS:  The patient was noted to have complete loss of cartilage and   bone-on-bone arthritis with associated osteophytes in the medial and patellofemoral compartments of   the knee with a significant synovitis and associated effusion.      PROCEDURE:  Left total knee replacement.      COMPONENTS USED:  DePuy Attune rotating platform posterior stabilized knee   system, a size 8 femur, 8 tibia, size 6 PS AOX insert, and 41 anatomic patellar   button.      SURGEON:  Pietro Cassis. Alvan Dame, M.D.      ASSISTANT:  Danae Orleans, PA-C.      ANESTHESIA:  Regional and Spinal.      SPECIMENS:  None.      COMPLICATION:  None.      DRAINS:  None.  EBL: <200 cc      TOURNIQUET TIME:   40 min at 250 mmHg     The patient was stable to the recovery room.      INDICATION FOR PROCEDURE:  Joshua [name] is a 66 y.o. male patient of   mine.  The patient had been seen, evaluated, and treated conservatively in the   office with medication, activity modification, and injections.  The patient had   radiographic changes of bone-on-bone arthritis with endplate sclerosis and osteophytes noted.      The patient failed conservative measures including medication, injections, and activity modification, and at this point was ready for more definitive measures.   Based on the radiographic changes and failed conservative measures, the patient   decided to proceed with total knee replacement.  Risks of infection,   DVT, component failure, need for revision surgery, postop course, and   expectations were all   discussed and reviewed.  Consent was obtained for benefit of pain   relief.      PROCEDURE IN DETAIL:  The patient was brought to the operative theater.   Once adequate anesthesia, preoperative antibiotics, 3 gm of Ancef, 1 gm of Tranexamic Acid, and 10 mg of Decadron administered, the patient was positioned supine with the left thigh tourniquet placed.  The  left lower extremity was prepped and draped in sterile fashion.  A time-   out was performed identifying the patient, planned procedure, and   extremity.      The left lower extremity was placed in the [facility] leg holder.  The leg was   exsanguinated, tourniquet elevated to 250 mmHg.  A midline incision was   made followed by median parapatellar arthrotomy.  Following initial   exposure, attention  was first directed to the patella.  Precut   measurement was noted to be 26 mm.  I resected down to 15 mm and used a   41 anatomic patellar button to restore patellar height as well as cover the cut   surface.      The lug holes were drilled and a metal shim was placed to protect the   patella from retractors and saw blades.      At this point, attention was now directed to the femur.  The femoral   canal was opened with a drill, irrigated to try to prevent fat emboli.  An   intramedullary rod was passed at 5 degrees valgus, 9 mm of bone was   resected off the distal femur.  Following this resection, the tibia was   subluxated anteriorly.  Using the extramedullary guide, 2 mm of bone was resected off   the proximal medial tibia.  We confirmed the gap would be   stable medially and laterally with a size 5 spacer block as well as confirmed   the cut was perpendicular in the coronal plane, checking with an alignment rod.      Once this was done, I sized the femur to be a size 8 in the anterior-   posterior dimension, chose a standard component based on medial and   lateral dimension.  The size 8 rotation block was then pinned in   position  anterior referenced using the C-clamp to set rotation.  The   anterior, posterior, and  chamfer cuts were made without difficulty nor   notching making certain that I was along the anterior cortex to help   with flexion gap stability.      The final box cut was made off the lateral aspect of distal femur.      At this point, the tibia was sized to be a size 8, the size 8 tray was   then pinned in position through the medial third of the tubercle,   drilled, and keel punched.  Trial reduction was now carried with a 8 femur,  8 tibia, a size 5 then 6 PS insert, and the 41 anatomic patella botton.  The knee was brought to   extension, full extension with good flexion stability with the patella   tracking through the trochlea without application of pressure.  Given   all these findings the femoral lug holes were drilled and then the trial components removed.  Final components were   opened and cement was mixed.  The knee was irrigated with normal saline   solution and pulse lavage.  The synovial lining was   then injected with 30 cc of 0.25% Marcaine with epinephrine and 1 cc of Toradol plus 30 cc of NS for a total of 61 cc.      The knee was irrigated.  Final implants were then cemented onto clean and   dried cut surfaces of bone with the knee brought to extension with a size 6   mm trial insert.      Once the cement had fully cured, the excess cement was removed   throughout the knee.  I confirmed I was satisfied with the range of   motion and stability, and the final size 6 PS AOX insert was chosen.  It was   placed into the knee.      The tourniquet had been let down at 40 minutes.  No significant   hemostasis required.  The  extensor mechanism was then reapproximated using #1 Vicryl and #0 Stratafix with the knee   in flexion.  The   remaining wound was closed with 2-0 Vicryl and running 4-0 Monocryl.   The knee was cleaned, dried, dressed sterilely using Dermabond and   Aquacel  dressing.  The patient was then   brought to recovery room in stable condition, tolerating the procedure   well.   Please note that Physician Assistant, Danae Orleans, PA-C, was present for the entirety of the case, and was utilized for pre-operative positioning, peri-operative retractor management, general facilitation of the procedure.  He was also utilized for primary wound closure at the end of the case.              Pietro Cassis Alvan Dame, M.D.    03/30/2017 7:31 AM

## 2017-03-30 NOTE — Transfer of Care (Signed)
Immediate Anesthesia Transfer of Care Note  Patient: Joshua [name]  Procedure(s) Performed: Procedure(s): LEFT TOTAL KNEE ARTHROPLASTY (Left)  Patient Location: PACU  Anesthesia Type:Spinal  Level of Consciousness:  sedated, patient cooperative and responds to stimulation  Airway & Oxygen Therapy:Patient Spontanous Breathing and Patient connected to face mask oxgen  Post-op Assessment:  Report given to PACU RN and Post -op Vital signs reviewed and stable  Post vital signs:  Reviewed and stable  Last Vitals:  Vitals:   03/30/17 0650 03/30/17 0652  BP:  (!) 151/85  Pulse: 74 75  Resp: (!) 8 12  Temp:      Complications: No apparent anesthesia complications

## 2017-03-30 NOTE — Discharge Instructions (Signed)

## 2017-03-30 NOTE — Progress Notes (Signed)
Subjective: 66 year old male presents the office today for follow-up evaluation of left foot ulceration. He states the wound is doing much better he denies any swelling or redness or any drainage or pus. He has continue with a surgical boot. He is continued antibiotics as well. Denies any systemic complaints such as fevers, chills, nausea, vomiting. No acute changes since last appointment, and no other complaints at this time.   Objective: AAO x3, NAD DP/PT pulses palpable bilaterally, CRT less than 3 seconds On the plantar aspect the left foot submetatarsal 4 hyperkeratotic lesion with what appears to be an old blister around the area. After debridement the wound is almost completely healed today. There is a very small pinpoint opening which is granular. There is no probing, undermining or tunneling. There is no surrounding erythema, ascending synovitis. There is no fluctuance, crepitus, malodor. No open lesions or pre-ulcerative lesions.  No pain with calf compression, swelling, warmth, erythema  Assessment: Recurrent ulceration left foot  Plan: -All treatment options discussed with the patient including all alternatives, risks, complications.  -Wound sharply debrided without complications. Continue with interbody ointment dressing changes daily. Continue with CAM boot for the next week. He is scheduled for knee surgery next week. There is no signs of infection today however we will continue antibiotics in preparation for this. Monitor for any clinical signs or symptoms of infection and directed to call the office immediately should any occur or go to the ER. -He was also evaluated today by Audry Pili was molded for diabetic shoe with insert. -Patient encouraged to call the office with any questions, concerns, change in symptoms.   Celesta Gentile, DPM

## 2017-03-30 NOTE — Anesthesia Procedure Notes (Signed)
Spinal  Patient location during procedure: OR Start time: 03/30/2017 7:30 AM End time: 03/30/2017 7:40 AM Staffing Resident/CRNA: Jobina Maita Performed: resident/CRNA  Spinal Block Patient position: sitting Prep: DuraPrep Patient monitoring: heart rate, cardiac monitor, continuous pulse ox and blood pressure Approach: midline Location: L3-4 Injection technique: single-shot Needle Needle type: Pencan  Needle gauge: 24 G Needle length: 9 cm Needle insertion depth: 7 cm Assessment Sensory level: T6 Additional Notes Lot and expiration date ok.  -heme, -para, VSS. Pt tolerated well.

## 2017-03-30 NOTE — Progress Notes (Signed)
PHARMACIST - PHYSICIAN ORDER COMMUNICATION  CONCERNING: P&T Medication Policy on Herbal Medications  DESCRIPTION:  This patient's order for: lutein-zeaxanthin    has been noted.  This product(s) is classified as an "herbal" or natural product. Due to a lack of definitive safety studies or FDA approval, nonstandard manufacturing practices, plus the potential risk of unknown drug-drug interactions while on inpatient medications, the Pharmacy and Therapeutics Committee does not permit the use of "herbal" or natural products of this type within [facility]   ACTION TAKEN: The pharmacy department is unable to verify this order at this time and your patient has been informed of this safety policy. Please reevaluate patient's clinical condition at discharge and address if the herbal or natural product(s) should be resumed at that time.

## 2017-03-30 NOTE — Progress Notes (Signed)
RT placed patient on CPAP. Patient setting is auto 7-20 cmH2O. Sterile water added to water chamber for humidification. Patient is tolerating well.

## 2017-03-30 NOTE — Interval H&P Note (Signed)
History and Physical Interval Note:  03/30/2017 6:58 AM  Joshua [name]  has presented today for surgery, with the diagnosis of Left knee Osteoarthritis  The various methods of treatment have been discussed with the patient and family. After consideration of risks, benefits and other options for treatment, the patient has consented to  Procedure(s): LEFT TOTAL KNEE ARTHROPLASTY (Left) as a surgical intervention .  The patient's history has been reviewed, patient examined, no change in status, stable for surgery.  I have reviewed the patient's chart and labs.  Questions were answered to the patient's satisfaction.     [name]

## 2017-03-30 NOTE — Anesthesia Procedure Notes (Signed)
Anesthesia Regional Block: Adductor canal block   Pre-Anesthetic Checklist: ,, timeout performed, Correct Patient, Correct Site, Correct Laterality, Correct Procedure, Correct Position, site marked, Risks and benefits discussed,  Surgical consent,  Pre-op evaluation,  At surgeon's request and post-op pain management  Laterality: Left  Prep: chloraprep       Needles:  Injection technique: Single-shot  Needle Type: Echogenic Needle     Needle Length: 9cm  Needle Gauge: 21     Additional Needles:   Procedures: ultrasound guided,,,,,,,,  Narrative:  Start time: 03/30/2017 6:43 AM End time: 03/30/2017 6:48 AM Injection made incrementally with aspirations every 5 mL.  Performed by: Personally  Anesthesiologist: Catalina Gravel  Additional Notes: No pain on injection. No increased resistance to injection. Injection made in 5cc increments.  Good needle visualization.  Patient tolerated procedure well.

## 2017-03-30 NOTE — Progress Notes (Signed)
AssistedDr. Turk with left, ultrasound guided, adductor canal block. Side rails up, monitors on throughout procedure. See vital signs in flow sheet. Tolerated Procedure well.  

## 2017-03-31 DIAGNOSIS — M1711 Unilateral primary osteoarthritis, right knee: Secondary | ICD-10-CM | POA: Diagnosis not present

## 2017-03-31 LAB — BASIC METABOLIC PANEL
ANION GAP: 6 (ref 5–15)
BUN: 36 mg/dL — ABNORMAL HIGH (ref 6–20)
CALCIUM: 8.1 mg/dL — AB (ref 8.9–10.3)
CHLORIDE: 106 mmol/L (ref 101–111)
CO2: 26 mmol/L (ref 22–32)
Creatinine, Ser: 1.34 mg/dL — ABNORMAL HIGH (ref 0.61–1.24)
GFR calc non Af Amer: 53 mL/min — ABNORMAL LOW (ref >60)
Glucose, Bld: 188 mg/dL — ABNORMAL HIGH (ref 65–99)
Potassium: 4.1 mmol/L (ref 3.5–5.1)
SODIUM: 138 mmol/L (ref 135–145)

## 2017-03-31 LAB — CBC
HCT: 32.6 % — ABNORMAL LOW (ref 39.0–52.0)
HEMOGLOBIN: 10.9 g/dL — AB (ref 13.0–17.0)
MCH: 29.8 pg (ref 26.0–34.0)
MCHC: 33.4 g/dL (ref 30.0–36.0)
MCV: 89.1 fL (ref 78.0–100.0)
Platelets: 197 10*3/uL (ref 150–400)
RBC: 3.66 MIL/uL — ABNORMAL LOW (ref 4.22–5.81)
RDW: 13.2 % (ref 11.5–15.5)
WBC: 10.8 10*3/uL — AB (ref 4.0–10.5)

## 2017-03-31 LAB — GLUCOSE, CAPILLARY
GLUCOSE-CAPILLARY: 140 mg/dL — AB (ref 65–99)
GLUCOSE-CAPILLARY: 199 mg/dL — AB (ref 65–99)

## 2017-03-31 MED ORDER — CELECOXIB 200 MG PO CAPS: 200.0000 mg | ORAL_CAPSULE | Freq: Two times a day (BID) | ORAL | 0 refills | Status: DC

## 2017-03-31 NOTE — Progress Notes (Signed)
Physical Therapy Treatment Patient Details Name: Joshua [name] MRN: [id number] DOB: 07-Nov-1951 Today's Date: 03/31/2017    History of Present Illness L TKA    PT Comments    POD # 1 pm session Spouse present during session for "hands on" instruction.  Assisted with amb, stairs and HEP.     Follow Up Recommendations   OP     Equipment Recommendations    has a walker   Recommendations for Other Services       Precautions / Restrictions Precautions Precautions: Fall;Knee Restrictions Weight Bearing Restrictions: No Other Position/Activity Restrictions: WBAT    Mobility  Bed Mobility Overal bed mobility: Modified Independent Bed Mobility: Supine to Sit;Sit to Supine           General bed mobility comments: HOB 30 and increased time  Transfers Overall transfer level: Needs assistance Equipment used: Rolling walker (2 wheeled) Transfers: Sit to/from Stand Sit to Stand: Supervision;Min guard         General transfer comment: 25% VC's on proper hand placement to extend L LE prior to sit/stand  Ambulation/Gait Ambulation/Gait assistance: Supervision;Min guard Ambulation Distance (Feet): 95 Feet Assistive device: Rolling walker (2 wheeled) Gait Pattern/deviations: Step-to pattern;Step-through pattern Gait velocity: decreased   General Gait Details: 25% VC's on proper walker to self distance and proper sequencing   Stairs Stairs: Yes   Stair Management: Two rails;Step to pattern;Backwards Number of Stairs: 2 General stair comments: with spouse present performed stairs with 25% VC's on proper tech and safety.  Wheelchair Mobility    Modified Rankin (Stroke Patients Only)       Balance                                            Cognition Arousal/Alertness: Awake/alert Behavior During Therapy: WFL for tasks assessed/performed Overall Cognitive Status: Within Functional Limits for tasks assessed                                         Exercises   Total Knee Replacement TE's 10 reps B LE ankle pumps 10 reps towel squeezes 10 reps knee presses 10 reps heel slides  10 reps SAQ's 10 reps SLR's 10 reps ABD Followed by ICE     General Comments        Pertinent Vitals/Pain Pain Assessment: 0-10 Pain Score: 6  Pain Location: left knee Pain Descriptors / Indicators: Operative site guarding;Sore;Tender Pain Intervention(s): Monitored during session;Repositioned;Ice applied    Home Living                      Prior Function            PT Goals (current goals can now be found in the care plan section) Progress towards PT goals: Progressing toward goals    Frequency    7X/week      PT Plan Current plan remains appropriate    Co-evaluation              AM-PAC PT "6 Clicks" Daily Activity  Outcome Measure  Difficulty turning over in bed (including adjusting bedclothes, sheets and blankets)?: A Little Difficulty moving from lying on back to sitting on the side of the bed? : A Little Difficulty sitting down on and standing up from  a chair with arms (e.g., wheelchair, bedside commode, etc,.)?: A Little Help needed moving to and from a bed to chair (including a wheelchair)?: A Little Help needed walking in hospital room?: A Little Help needed climbing 3-5 steps with a railing? : A Lot 6 Click Score: 17    End of Session Equipment Utilized During Treatment: Gait belt Activity Tolerance: Patient tolerated treatment well Patient left: in chair;with call bell/phone within reach;with family/visitor present Nurse Communication: Mobility status PT Visit Diagnosis: Difficulty in walking, not elsewhere classified (R26.2)     Time: 2257-5051 PT Time Calculation (min) (ACUTE ONLY): 40 min  Charges:  $Gait Training: 8-22 mins $Therapeutic Exercise: 8-22 mins $Therapeutic Activity: 8-22 mins                    G Codes:       {Adlai Sinning  PTA [facility]  Acute   Rehab Pager      [phone]

## 2017-03-31 NOTE — Progress Notes (Signed)
Patient ID: Joshua [name], male   DOB: Mar 12, 1951, 66 y.o.   MRN: [id number] Subjective: 1 Day Post-Op Procedure(s) (LRB): LEFT TOTAL KNEE ARTHROPLASTY (Left)    Patient reports pain as mild to moderate.  Was up ambulating last night.  Objective:   VITALS:   Vitals:   03/31/17 0215 03/31/17 0530  BP: 123/76 120/71  Pulse: 79 82  Resp: 16 16  Temp: 97.9 F (36.6 C) 98.6 F (37 C)    Neurovascular intact Incision: dressing C/D/I  LABS  Recent Labs  03/31/17 0604  HGB 10.9*  HCT 32.6*  WBC 10.8*  PLT 197     Recent Labs  03/31/17 0604  NA 138  K 4.1  BUN 36*  CREATININE 1.34*  GLUCOSE 188*    No results for input(s): LABPT, INR in the last 72 hours.   Assessment/Plan: 1 Day Post-Op Procedure(s) (LRB): LEFT TOTAL KNEE ARTHROPLASTY (Left)   Advance diet Up with therapy  Doing well.  If he continues to progress with therapy today I plan for him to be able to go outpt PT already set up Continue Cleocin from prior prescription for 10 days due to high risk and existing healing foot ulcer RTC in 2 weeks - reviewed goals

## 2017-03-31 NOTE — Evaluation (Signed)
Occupational Therapy Evaluation Patient Details Name: Joshua [name] MRN: [id number] DOB: 1951/01/14 Today's Date: 03/31/2017    History of Present Illness L TKA   Clinical Impression   This 66 year old man was admitted for the above sx. All education was completed. No further OT is needed at this time    Follow Up Recommendations  No OT follow up    Equipment Recommendations  None recommended by OT    Recommendations for Other Services       Precautions / Restrictions Precautions Precautions: Fall;Knee Restrictions Weight Bearing Restrictions: No      Mobility Bed Mobility Overal bed mobility: Modified Independent             General bed mobility comments: HOB 30  Transfers Overall transfer level: Needs assistance Equipment used: Rolling walker (2 wheeled)   Sit to Stand: Min assist;Min guard         General transfer comment: Min A from bed and min guard from 3:1 commode. Cues for UE/LE placement    Balance                                           ADL either performed or assessed with clinical judgement   ADL Overall ADL's : Needs assistance/impaired     Grooming: Oral care;Supervision/safety;Standing   Upper Body Bathing: Set up;Sitting   Lower Body Bathing: Minimal assistance;Sit to/from stand   Upper Body Dressing : Set up;Sitting   Lower Body Dressing: Moderate assistance;Sit to/from stand   Toilet Transfer: Min guard;Ambulation;BSC;RW   Toileting- Water quality scientist and Hygiene: Min guard;Sit to/from stand         General ADL Comments: verbalizes shower sequence and handout given.  Educated to step in backwards using RW then transition to grab bars to access seat.  Not interested in AE; wife will assist at home     Vision         Perception     Praxis      Pertinent Vitals/Pain Pain Assessment: 0-10 Pain Score: 5  Pain Location: left knee Pain Descriptors / Indicators: Aching Pain Intervention(s):  Limited activity within patient's tolerance;Monitored during session;Premedicated before session;Repositioned (removed ice)     Hand Dominance     Extremity/Trunk Assessment Upper Extremity Assessment Upper Extremity Assessment: Overall WFL for tasks assessed           Communication Communication Communication: No difficulties   Cognition Arousal/Alertness: Awake/alert Behavior During Therapy: WFL for tasks assessed/performed Overall Cognitive Status: Within Functional Limits for tasks assessed                                     General Comments       Exercises     Shoulder Instructions      Home Living Family/patient expects to be discharged to:: Private residence Living Arrangements: Spouse/significant other Available Help at Discharge: Family Type of Home: House             Bathroom Shower/Tub: Walk-in Corporate treasurer Toilet: Standard     Home Equipment: Bedside commode;Shower seat - built in;Grab bars - tub/shower          Prior Functioning/Environment Level of Independence: Independent                 OT  Problem List:        OT Treatment/Interventions:      OT Goals(Current goals can be found in the care plan section) Acute Rehab OT Goals Patient Stated Goal: to get the other knee done OT Goal Formulation: All assessment and education complete, DC therapy  OT Frequency:     Barriers to D/C:            Co-evaluation              AM-PAC PT "6 Clicks" Daily Activity     Outcome Measure Help from another person eating meals?: None Help from another person taking care of personal grooming?: A Little Help from another person toileting, which includes using toliet, bedpan, or urinal?: A Little Help from another person bathing (including washing, rinsing, drying)?: A Little Help from another person to put on and taking off regular upper body clothing?: A Little Help from another person to put on and taking off  regular lower body clothing?: A Lot 6 Click Score: 18   End of Session    Activity Tolerance: Patient tolerated treatment well Patient left: in chair;with call bell/phone within reach  OT Visit Diagnosis: Pain Pain - Right/Left: Left Pain - part of body: Knee                Time: 2010-0712 OT Time Calculation (min): 26 min Charges:  OT General Charges $OT Visit: 1 Procedure OT Evaluation $OT Eval Low Complexity: 1 Procedure G-Codes: OT G-codes **NOT FOR INPATIENT CLASS** Functional Assessment Tool Used: Clinical judgement;AM-PAC 6 Clicks Daily Activity Functional Limitation: Self care Self Care Current Status (R9758): At least 40 percent but less than 60 percent impaired, limited or restricted Self Care Goal Status (I3254): At least 40 percent but less than 60 percent impaired, limited or restricted Self Care Discharge Status [phone]): At least 40 percent but less than 60 percent impaired, limited or restricted   Lesle Chris, OTR/L [phone] 03/31/2017  Joshua [name] 03/31/2017, 10:19 AM

## 2017-03-31 NOTE — Progress Notes (Signed)
Physical Therapy Treatment Patient Details Name: Joshua [name] MRN: [id number] DOB: 1950-11-29 Today's Date: 03/31/2017    History of Present Illness L TKA    PT Comments    POD # 1 am session Assisted OOB with increased time to amb in hallway with walker.  Returned to room then performed TKR TE's followed by ICE Pt will need another PT session to address stairs with spouse present  Follow Up Recommendations        Equipment Recommendations       Recommendations for Other Services       Precautions / Restrictions Precautions Precautions: Fall;Knee Restrictions Weight Bearing Restrictions: No Other Position/Activity Restrictions: WBAT    Mobility  Bed Mobility Overal bed mobility: Modified Independent Bed Mobility: Supine to Sit;Sit to Supine           General bed mobility comments: HOB 30 and increased time  Transfers Overall transfer level: Needs assistance Equipment used: Rolling walker (2 wheeled) Transfers: Sit to/from Stand Sit to Stand: Supervision;Min guard         General transfer comment: 25% VC's on proper hand placement to extend L LE prior to sit/stand  Ambulation/Gait Ambulation/Gait assistance: Supervision;Min guard Ambulation Distance (Feet): 80 Feet Assistive device: Rolling walker (2 wheeled) Gait Pattern/deviations: Step-to pattern;Step-through pattern Gait velocity: decreased   General Gait Details: 25% VC's on proper walker to self distance and proper sequencing   Stairs            Wheelchair Mobility    Modified Rankin (Stroke Patients Only)       Balance                                            Cognition Arousal/Alertness: Awake/alert Behavior During Therapy: WFL for tasks assessed/performed Overall Cognitive Status: Within Functional Limits for tasks assessed                                        Exercises   Total Knee Replacement TE's 10 reps B LE ankle pumps 10  reps towel squeezes 10 reps knee presses 10 reps heel slides  10 reps SAQ's 10 reps SLR's 10 reps ABD Followed by ICE     General Comments        Pertinent Vitals/Pain Pain Assessment: 0-10 Pain Score: 6  Pain Location: left knee Pain Descriptors / Indicators: Operative site guarding;Sore;Tender Pain Intervention(s): Monitored during session;Repositioned;Ice applied    Home Living                      Prior Function            PT Goals (current goals can now be found in the care plan section) Progress towards PT goals: Progressing toward goals    Frequency    7X/week      PT Plan Current plan remains appropriate    Co-evaluation              AM-PAC PT "6 Clicks" Daily Activity  Outcome Measure  Difficulty turning over in bed (including adjusting bedclothes, sheets and blankets)?: A Little Difficulty moving from lying on back to sitting on the side of the bed? : A Little Difficulty sitting down on and standing up from a chair with arms (e.g., wheelchair,  bedside commode, etc,.)?: A Little Help needed moving to and from a bed to chair (including a wheelchair)?: A Little Help needed walking in hospital room?: A Little Help needed climbing 3-5 steps with a railing? : A Lot 6 Click Score: 17    End of Session Equipment Utilized During Treatment: Gait belt Activity Tolerance: Patient tolerated treatment well Patient left: in chair;with call bell/phone within reach;with family/visitor present Nurse Communication: Mobility status PT Visit Diagnosis: Difficulty in walking, not elsewhere classified (R26.2)     Time: 8177-1165 PT Time Calculation (min) (ACUTE ONLY): 28 min  Charges:  $Gait Training: 8-22 mins $Therapeutic Exercise: 8-22 mins                    G Codes:       {Kaylan Friedmann  PTA [facility]  Acute  Rehab Pager      [phone]

## 2017-03-31 NOTE — Progress Notes (Signed)
Discharge planning, no HH needs identified. Patient plans OP PT which is already scheduled. No DME needs. [phone]

## 2017-04-01 NOTE — Discharge Summary (Signed)
Physician Discharge Summary  Patient ID: Joshua [name] MRN: [id number] DOB/AGE: 68-03-1951 68 y.o.  Admit date: 03/30/2017 Discharge date: 03/31/2017   Procedures:  Procedure(s) (LRB): LEFT TOTAL KNEE ARTHROPLASTY (Left)  Attending Physician:  Dr. Paralee Cancel   Admission Diagnoses:   Left knee primary OA / pain  Discharge Diagnoses:  Principal Problem:   S/P left TKA Active Problems:   S/P total knee replacement  Past Medical History:  Diagnosis Date  . Benign essential tremor   . Benign positional vertigo   . Cerebrovascular disease    right parietal stroke  . CVA (cerebral vascular accident) ([place])   . Degenerative arthritis   . Diabetes mellitus   . Dyslipidemia   . GERD (gastroesophageal reflux disease)    hiatal hernia  . Gout   . H/O: vasectomy   . Hx of appendectomy   . Hx of tonsillectomy   . Hypertension   . 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 Melanoma ([place])   . Obesity   . OSA on CPAP     HPI:    Joshua [name], 66 y.o. male, has a history of pain and functional disability in the left knee due to arthritis and has failed non-surgical conservative treatments for greater than 12 weeks to includeNSAID's and/or analgesics, corticosteriod injections and activity modification.  Onset of symptoms was gradual, starting >10 years ago with gradually worsening course since that time. The patient noted prior procedures on the knee to include  arthroscopy x2 on the left knee(s).  Patient currently rates pain in the left knee(s) at 8 out of 10 with activity. Patient has worsening of pain with activity and weight bearing, pain that interferes with activities of daily living, pain with passive range of motion, crepitus and joint swelling.  Patient has evidence of periarticular osteophytes and joint space narrowing by imaging studies.  There is no active infection.   Risks, benefits and expectations were discussed with the patient.  Risks including but not limited to  the risk of anesthesia, blood clots, nerve damage, blood vessel damage, failure of the prosthesis, infection and up to and including death.  Patient understand the risks, benefits and expectations and wishes to proceed with surgery.   PCP: [name], MD   Discharged Condition: good  Hospital Course:  Patient underwent the above stated procedure on 03/30/2017. Patient tolerated the procedure well and brought to the recovery room in good condition and subsequently to the floor.  POD #1 BP: 120/71 ; Pulse: 82 ; Temp: 98.6 F (37 C) ; Resp: 16 Patient reports pain as mild to moderate.  Was up ambulating last night. Neurovascular intact and incision: dressing C/D/I.  LABS  Basename    HGB     10.9  HCT     32.6    Discharge Exam: General appearance: alert, cooperative and no distress Extremities: Homans sign is negative, no sign of DVT, no edema, redness or tenderness in the calves or thighs and no ulcers, gangrene or trophic changes  Disposition: Home with follow up in 2 weeks   Follow-up Information    Paralee Cancel, MD. Schedule an appointment as soon as possible for a visit in 2 week(s).   Specialty:  Orthopedic Surgery Contact information: [address] [place] [address] [phone]           Discharge Instructions    Call MD / Call 911    Complete by:  As directed  If you experience chest pain or shortness of breath, CALL 911 and be transported to the hospital emergency room.  If you develope a fever above 101 F, pus (white drainage) or increased drainage or redness at the wound, or calf pain, call your surgeon's office.   Change dressing    Complete by:  As directed    Maintain surgical dressing until follow up in the clinic. If the edges start to pull up, may reinforce with tape. If the dressing is no longer working, may remove and cover with gauze and tape, but must keep the area dry and clean.  Call with any questions or concerns.    Constipation Prevention    Complete by:  As directed    Drink plenty of fluids.  Prune juice may be helpful.  You may use a stool softener, such as Colace (over the counter) 100 mg twice a day.  Use MiraLax (over the counter) for constipation as needed.   Diet - low sodium heart healthy    Complete by:  As directed    Discharge instructions    Complete by:  As directed    Maintain surgical dressing until follow up in the clinic. If the edges start to pull up, may reinforce with tape. If the dressing is no longer working, may remove and cover with gauze and tape, but must keep the area dry and clean.  Follow up in 2 weeks at [facility]. Call with any questions or concerns.   Increase activity slowly as tolerated    Complete by:  As directed    Weight bearing as tolerated with assist device (walker, cane, etc) as directed, use it as long as suggested by your surgeon or therapist, typically at least 4-6 weeks.   TED hose    Complete by:  As directed    Use stockings (TED hose) for 2 weeks on both leg(s).  You may remove them at night for sleeping.      Allergies as of 03/31/2017   No Known Allergies     Medication List    STOP taking these medications   aspirin 81 MG tablet Replaced by:  aspirin 81 MG chewable tablet   cyclobenzaprine 10 MG tablet Commonly known as:  FLEXERIL   traMADol-acetaminophen 37.5-325 MG tablet Commonly known as:  ULTRACET     TAKE these medications   aspirin 81 MG chewable tablet Chew 1 tablet (81 mg total) by mouth 2 (two) times daily. Take for 4 weeks, then resume regular dose. Replaces:  aspirin 81 MG tablet   atorvastatin 40 MG tablet Commonly known as:  LIPITOR Take 40 mg by mouth at bedtime.   celecoxib 200 MG capsule Commonly known as:  CELEBREX Take 1 capsule (200 mg total) by mouth every 12 (twelve) hours.   clindamycin 300 MG capsule Commonly known as:  CLEOCIN Take 300 mg by mouth 3 (three) times daily.   clindamycin 300  MG capsule Commonly known as:  CLEOCIN Take 1 capsule (300 mg total) by mouth 3 (three) times daily.   docusate sodium 100 MG capsule Commonly known as:  COLACE Take 1 capsule (100 mg total) by mouth 2 (two) times daily.   escitalopram 20 MG tablet Commonly known as:  LEXAPRO Take 20 mg by mouth at bedtime.   fenofibrate 54 MG tablet Take 54 mg by mouth daily.   ferrous sulfate 325 (65 FE) MG tablet Commonly known as:  FERROUSUL Take 1 tablet (325 mg total) by mouth 3 (  three) times daily with meals.   Fish Oil 1000 MG Caps Take 2,000 mg by mouth daily. Reported on 02/13/2016   glimepiride 4 MG tablet Commonly known as:  AMARYL Take 4 mg by mouth 2 (two) times daily.   GLUCOPHAGE 1000 MG tablet Generic drug:  metFORMIN Take 1,000 mg by mouth 2 (two) times daily with a meal.   hydrochlorothiazide 25 MG tablet Commonly known as:  HYDRODIURIL Take 25 mg by mouth daily.   HYDROcodone-acetaminophen 7.5-325 MG tablet Commonly known as:  NORCO Take 1-2 tablets by mouth every 4 (four) hours as needed for moderate pain or severe pain.   losartan 100 MG tablet Commonly known as:  COZAAR Take 100 mg by mouth daily.   Lutein-Zeaxanthin 25-5 MG Caps Take 1 tablet by mouth daily.   methocarbamol 500 MG tablet Commonly known as:  ROBAXIN Take 1 tablet (500 mg total) by mouth every 6 (six) hours as needed for muscle spasms.   MULTIVITAMIN PO Take 1 tablet by mouth daily.   polyethylene glycol packet Commonly known as:  MIRALAX / GLYCOLAX Take 17 g by mouth 2 (two) times daily.   primidone 50 MG tablet Commonly known as:  MYSOLINE Take 1/2 tablet PO at bedtime for 1 week, if tolerating increase to 1/2 tablet BID   SYSTANE OP Place 1-2 drops into both eyes 2 (two) times daily as needed (dry eyes).   TOUJEO SOLOSTAR 300 UNIT/ML Sopn Generic drug:  Insulin Glargine Inject 70 Units into the skin 2 (two) times daily. Breakfast and supper   TRULICITY 1.5 MY/1.1ZN  Sopn Generic drug:  Dulaglutide Inject 1.5 mg into the skin once a week. Friday        Signed: West Pugh. Sevrin Sally   PA-C  04/01/2017, 8:24 AM

## 2017-04-09 ENCOUNTER — Emergency Department (HOSPITAL_COMMUNITY): Payer: Medicare Other

## 2017-04-09 ENCOUNTER — Inpatient Hospital Stay (HOSPITAL_COMMUNITY)
Admission: EM | Admit: 2017-04-09 | Discharge: 2017-04-11 | DRG: 301 | Disposition: A | Discharge status: Home Health | Payer: Medicare Other | Attending: Family Medicine | Admitting: Family Medicine

## 2017-04-09 ENCOUNTER — Encounter (HOSPITAL_COMMUNITY): Payer: Self-pay | Admitting: Emergency Medicine

## 2017-04-09 DIAGNOSIS — Z820 Family history of epilepsy and other diseases of the nervous system: Secondary | ICD-10-CM

## 2017-04-09 DIAGNOSIS — Z87891 Personal history of nicotine dependence: Secondary | ICD-10-CM

## 2017-04-09 DIAGNOSIS — G250 Essential tremor: Secondary | ICD-10-CM | POA: Diagnosis present

## 2017-04-09 DIAGNOSIS — I951 Orthostatic hypotension: Secondary | ICD-10-CM | POA: Diagnosis present

## 2017-04-09 DIAGNOSIS — E1169 Type 2 diabetes mellitus with other specified complication: Secondary | ICD-10-CM | POA: Diagnosis present

## 2017-04-09 DIAGNOSIS — R0602 Shortness of breath: Secondary | ICD-10-CM

## 2017-04-09 DIAGNOSIS — Z79899 Other long term (current) drug therapy: Secondary | ICD-10-CM

## 2017-04-09 DIAGNOSIS — T81718A Complication of other artery following a procedure, not elsewhere classified, initial encounter: Principal | ICD-10-CM | POA: Diagnosis present

## 2017-04-09 DIAGNOSIS — Z7982 Long term (current) use of aspirin: Secondary | ICD-10-CM

## 2017-04-09 DIAGNOSIS — G4733 Obstructive sleep apnea (adult) (pediatric): Secondary | ICD-10-CM | POA: Diagnosis present

## 2017-04-09 DIAGNOSIS — Z85820 Personal history of malignant melanoma of skin: Secondary | ICD-10-CM

## 2017-04-09 DIAGNOSIS — Z96652 Presence of left artificial knee joint: Secondary | ICD-10-CM | POA: Diagnosis present

## 2017-04-09 DIAGNOSIS — Z6833 Body mass index (BMI) 33.0-33.9, adult: Secondary | ICD-10-CM

## 2017-04-09 DIAGNOSIS — I2699 Other pulmonary embolism without acute cor pulmonale: Secondary | ICD-10-CM | POA: Diagnosis not present

## 2017-04-09 DIAGNOSIS — Z7984 Long term (current) use of oral hypoglycemic drugs: Secondary | ICD-10-CM

## 2017-04-09 DIAGNOSIS — K219 Gastro-esophageal reflux disease without esophagitis: Secondary | ICD-10-CM | POA: Diagnosis present

## 2017-04-09 DIAGNOSIS — E669 Obesity, unspecified: Secondary | ICD-10-CM | POA: Diagnosis present

## 2017-04-09 DIAGNOSIS — Y838 Other surgical procedures as the cause of abnormal reaction of the patient, or of later complication, without mention of misadventure at the time of the procedure: Secondary | ICD-10-CM | POA: Diagnosis present

## 2017-04-09 DIAGNOSIS — Z8673 Personal history of transient ischemic attack (TIA), and cerebral infarction without residual deficits: Secondary | ICD-10-CM

## 2017-04-09 DIAGNOSIS — I2510 Atherosclerotic heart disease of native coronary artery without angina pectoris: Secondary | ICD-10-CM | POA: Diagnosis present

## 2017-04-09 DIAGNOSIS — Z9989 Dependence on other enabling machines and devices: Secondary | ICD-10-CM

## 2017-04-09 DIAGNOSIS — Y92239 Unspecified place in hospital as the place of occurrence of the external cause: Secondary | ICD-10-CM | POA: Diagnosis present

## 2017-04-09 DIAGNOSIS — E119 Type 2 diabetes mellitus without complications: Secondary | ICD-10-CM | POA: Diagnosis present

## 2017-04-09 DIAGNOSIS — I10 Essential (primary) hypertension: Secondary | ICD-10-CM | POA: Diagnosis present

## 2017-04-09 DIAGNOSIS — E785 Hyperlipidemia, unspecified: Secondary | ICD-10-CM | POA: Diagnosis present

## 2017-04-09 LAB — CBC
HEMATOCRIT: 33 % — AB (ref 39.0–52.0)
Hemoglobin: 10.9 g/dL — ABNORMAL LOW (ref 13.0–17.0)
MCH: 29.3 pg (ref 26.0–34.0)
MCHC: 33 g/dL (ref 30.0–36.0)
MCV: 88.7 fL (ref 78.0–100.0)
PLATELETS: 280 10*3/uL (ref 150–400)
RBC: 3.72 MIL/uL — AB (ref 4.22–5.81)
RDW: 13.1 % (ref 11.5–15.5)
WBC: 10.5 10*3/uL (ref 4.0–10.5)

## 2017-04-09 LAB — GLUCOSE, CAPILLARY: GLUCOSE-CAPILLARY: 128 mg/dL — AB (ref 65–99)

## 2017-04-09 LAB — TROPONIN I: Troponin I: <0.03 ng/mL (ref <0.03)

## 2017-04-09 LAB — APTT: APTT: 39 s — AB (ref 24–36)

## 2017-04-09 LAB — PROTIME-INR
INR: 1.16
Prothrombin Time: 14.9 seconds (ref 11.4–15.2)

## 2017-04-09 LAB — BRAIN NATRIURETIC PEPTIDE: B NATRIURETIC PEPTIDE 5: 14.6 pg/mL (ref 0.0–100.0)

## 2017-04-09 MED ORDER — INSULIN ASPART 100 UNIT/ML ~~LOC~~ SOLN: 0.0000 [IU] | Freq: Every day | SUBCUTANEOUS | Status: DC

## 2017-04-09 MED ORDER — ATORVASTATIN CALCIUM 40 MG PO TABS
40.0000 mg | ORAL_TABLET | Freq: Every day | ORAL | Status: DC
  Administered 2017-04-09 – 2017-04-10 (×2): 40 mg via ORAL
  Filled 2017-04-09 (×2): qty 1

## 2017-04-09 MED ORDER — HEPARIN BOLUS VIA INFUSION
3300.0000 [IU] | Freq: Once | INTRAVENOUS | Status: AC
  Administered 2017-04-09: 3300 [IU] via INTRAVENOUS
  Filled 2017-04-09: qty 3300

## 2017-04-09 MED ORDER — LOSARTAN POTASSIUM 50 MG PO TABS
100.0000 mg | ORAL_TABLET | Freq: Every day | ORAL | Status: DC
  Administered 2017-04-10 – 2017-04-11 (×2): 100 mg via ORAL
  Filled 2017-04-09 (×2): qty 2

## 2017-04-09 MED ORDER — ESCITALOPRAM OXALATE 20 MG PO TABS
20.0000 mg | ORAL_TABLET | Freq: Every day | ORAL | Status: DC
  Administered 2017-04-09 – 2017-04-10 (×2): 20 mg via ORAL
  Filled 2017-04-09 (×2): qty 1

## 2017-04-09 MED ORDER — HYDROCHLOROTHIAZIDE 25 MG PO TABS
25.0000 mg | ORAL_TABLET | Freq: Every day | ORAL | Status: DC
  Administered 2017-04-10: 25 mg via ORAL
  Filled 2017-04-09: qty 1

## 2017-04-09 MED ORDER — ASPIRIN 81 MG PO CHEW
81.0000 mg | CHEWABLE_TABLET | Freq: Every day | ORAL | Status: DC
  Administered 2017-04-10 – 2017-04-11 (×2): 81 mg via ORAL
  Filled 2017-04-09 (×2): qty 1

## 2017-04-09 MED ORDER — IOPAMIDOL (ISOVUE-370) INJECTION 76%
INTRAVENOUS | Status: AC
  Administered 2017-04-09: 100 mL
  Filled 2017-04-09: qty 100

## 2017-04-09 MED ORDER — POLYETHYLENE GLYCOL 3350 17 G PO PACK
17.0000 g | PACK | Freq: Two times a day (BID) | ORAL | Status: DC
  Administered 2017-04-09 – 2017-04-10 (×3): 17 g via ORAL
  Filled 2017-04-09 (×4): qty 1

## 2017-04-09 MED ORDER — SODIUM CHLORIDE 0.9 % IV SOLN
INTRAVENOUS | Status: DC
  Administered 2017-04-09 – 2017-04-10 (×2): via INTRAVENOUS

## 2017-04-09 MED ORDER — POLYVINYL ALCOHOL 1.4 % OP SOLN: Freq: Two times a day (BID) | OPHTHALMIC | Status: DC | PRN

## 2017-04-09 MED ORDER — HYDROCODONE-ACETAMINOPHEN 7.5-325 MG PO TABS
1.0000 | ORAL_TABLET | ORAL | Status: DC | PRN
  Administered 2017-04-09 – 2017-04-11 (×3): 1 via ORAL
  Filled 2017-04-09 (×3): qty 1

## 2017-04-09 MED ORDER — METHOCARBAMOL 500 MG PO TABS: 500.0000 mg | ORAL_TABLET | Freq: Four times a day (QID) | ORAL | Status: DC | PRN

## 2017-04-09 MED ORDER — SODIUM CHLORIDE 0.9 % IV BOLUS (SEPSIS)
1000.0000 mL | Freq: Once | INTRAVENOUS | Status: AC
  Administered 2017-04-09: 1000 mL via INTRAVENOUS

## 2017-04-09 MED ORDER — ONDANSETRON HCL 4 MG PO TABS: 4.0000 mg | ORAL_TABLET | Freq: Four times a day (QID) | ORAL | Status: DC | PRN

## 2017-04-09 MED ORDER — DOCUSATE SODIUM 100 MG PO CAPS
100.0000 mg | ORAL_CAPSULE | Freq: Two times a day (BID) | ORAL | Status: DC
  Administered 2017-04-09 – 2017-04-10 (×3): 100 mg via ORAL
  Filled 2017-04-09 (×4): qty 1

## 2017-04-09 MED ORDER — FENOFIBRATE 54 MG PO TABS
54.0000 mg | ORAL_TABLET | Freq: Every day | ORAL | Status: DC
Start: 2017-04-10 — End: 2017-04-11
  Administered 2017-04-10 – 2017-04-11 (×2): 54 mg via ORAL
  Filled 2017-04-09 (×2): qty 1

## 2017-04-09 MED ORDER — INSULIN GLARGINE 100 UNIT/ML ~~LOC~~ SOLN
70.0000 [IU] | Freq: Two times a day (BID) | SUBCUTANEOUS | Status: DC
  Administered 2017-04-09 – 2017-04-11 (×4): 70 [IU] via SUBCUTANEOUS
  Filled 2017-04-09 (×5): qty 0.7

## 2017-04-09 MED ORDER — HEPARIN (PORCINE) IN NACL 100-0.45 UNIT/ML-% IJ SOLN
1900.0000 [IU]/h | INTRAMUSCULAR | Status: DC
  Administered 2017-04-09: 1900 [IU]/h via INTRAVENOUS
  Filled 2017-04-09: qty 250

## 2017-04-09 MED ORDER — ACETAMINOPHEN 650 MG RE SUPP: 650.0000 mg | Freq: Four times a day (QID) | RECTAL | Status: DC | PRN

## 2017-04-09 MED ORDER — INSULIN ASPART 100 UNIT/ML ~~LOC~~ SOLN
0.0000 [IU] | Freq: Three times a day (TID) | SUBCUTANEOUS | Status: DC
  Administered 2017-04-11: 2 [IU] via SUBCUTANEOUS

## 2017-04-09 MED ORDER — ACETAMINOPHEN 325 MG PO TABS: 650.0000 mg | ORAL_TABLET | Freq: Four times a day (QID) | ORAL | Status: DC | PRN

## 2017-04-09 MED ORDER — SODIUM CHLORIDE 0.9% FLUSH
3.0000 mL | Freq: Two times a day (BID) | INTRAVENOUS | Status: DC
  Administered 2017-04-10 – 2017-04-11 (×3): 3 mL via INTRAVENOUS

## 2017-04-09 MED ORDER — ONDANSETRON HCL 4 MG/2ML IJ SOLN: 4.0000 mg | Freq: Four times a day (QID) | INTRAMUSCULAR | Status: DC | PRN

## 2017-04-09 MED ORDER — SODIUM CHLORIDE 0.9 % IV SOLN: INTRAVENOUS | Status: DC

## 2017-04-09 NOTE — ED Provider Notes (Signed)
[facility] DEPT Provider Note   CSN: [id number] Arrival date & time: 04/09/17  1551     History   Chief Complaint Chief Complaint  Patient presents with  . Hypotension    HPI JKAI ARWOOD is a 66 y.o. male.  66 year old male with history of recent left knee surgery presents with several days of shortness of breath as well as hypotension when standing. Denies any recent volume loss. Denies any pleuritic chest pain. No new swelling to his left lower extremity. States that when he stands up his blood pressure dropped to 70s. Does take antihypertensive medication. Saw his physician today and I reviewed blood work from the office which showed a creatinine of 1.4 which was only slightly increased from a baseline of 1.3. His hemoglobin was stable. His physician took him off his antihypertensives as he was hypotensive in the office when he stood up. He was sent here for evaluation of possible pulmonary embolus.      Past Medical History:  Diagnosis Date  . Benign essential tremor   . Benign positional vertigo   . Cerebrovascular disease    right parietal stroke  . CVA (cerebral vascular accident) ([place])   . Degenerative arthritis   . Diabetes mellitus   . Dyslipidemia   . GERD (gastroesophageal reflux disease)    hiatal hernia  . Gout   . H/O: vasectomy   . Hx of appendectomy   . Hx of tonsillectomy   . Hypertension   . 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 Melanoma ([place])   . Obesity   . OSA on CPAP     Patient Active Problem List   Diagnosis Date Noted  . S/P left TKA 03/30/2017  . S/P total knee replacement 03/30/2017  . Cellulitis of left foot 03/07/2016  . Pre-ulcerative calluses 03/07/2016  . Type 2 diabetes mellitus with left diabetic foot ulcer ([place]) 01/14/2016  . DIABETES MELLITUS-TYPE II 08/07/2008  . GERD 08/07/2008    Past Surgical History:  Procedure Laterality Date  . APPENDECTOMY    . arthroscopic knee surgery Bilateral   . CATARACT  EXTRACTION Bilateral   . NASAL SEPTUM SURGERY    . TONSILLECTOMY    . TOTAL KNEE ARTHROPLASTY Left 03/30/2017   Procedure: LEFT TOTAL KNEE ARTHROPLASTY;  Surgeon: Paralee Cancel, MD;  Location: [facility] ORS;  Service: Orthopedics;  Laterality: Left;       Home Medications    Prior to Admission medications   Medication Sig Start Date End Date Taking? Authorizing Provider  aspirin 81 MG chewable tablet Chew 1 tablet (81 mg total) by mouth 2 (two) times daily. Take for 4 weeks, then resume regular dose. 03/31/17 04/30/17  Danae Orleans, PA-C  atorvastatin (LIPITOR) 40 MG tablet Take 40 mg by mouth at bedtime.     [provider]  celecoxib (CELEBREX) 200 MG capsule Take 1 capsule (200 mg total) by mouth every 12 (twelve) hours. 03/31/17   Danae Orleans, PA-C  clindamycin (CLEOCIN) 300 MG capsule Take 300 mg by mouth 3 (three) times daily.    [provider]  clindamycin (CLEOCIN) 300 MG capsule Take 1 capsule (300 mg total) by mouth 3 (three) times daily. 03/26/17   Trula Slade, DPM  docusate sodium (COLACE) 100 MG capsule Take 1 capsule (100 mg total) by mouth 2 (two) times daily. 03/30/17   Danae Orleans, PA-C  escitalopram (LEXAPRO) 20 MG tablet Take 20 mg by mouth at bedtime.    [provider]  fenofibrate 54 MG tablet Take 54 mg by mouth daily.    [provider]  ferrous sulfate (FERROUSUL) 325 (65 FE) MG tablet Take 1 tablet (325 mg total) by mouth 3 (three) times daily with meals. 03/30/17   Danae Orleans, PA-C  glimepiride (AMARYL) 4 MG tablet Take 4 mg by mouth 2 (two) times daily.    [provider]  hydrochlorothiazide (HYDRODIURIL) 25 MG tablet Take 25 mg by mouth daily.    [provider]  HYDROcodone-acetaminophen (NORCO) 7.5-325 MG tablet Take 1-2 tablets by mouth every 4 (four) hours as needed for moderate pain or severe pain. 03/30/17   Danae Orleans, PA-C  losartan (COZAAR) 100 MG tablet Take 100 mg by mouth daily.     [provider]  Lutein-Zeaxanthin 25-5 MG CAPS Take 1 tablet by mouth daily.    [provider]  metFORMIN (GLUCOPHAGE) 1000 MG tablet Take 1,000 mg by mouth 2 (two) times daily with a meal.    [provider]  methocarbamol (ROBAXIN) 500 MG tablet Take 1 tablet (500 mg total) by mouth every 6 (six) hours as needed for muscle spasms. 03/30/17   Danae Orleans, PA-C  Multiple Vitamins-Minerals (MULTIVITAMIN PO) Take 1 tablet by mouth daily.     [provider]  Omega-3 Fatty Acids (FISH OIL) 1000 MG CAPS Take 2,000 mg by mouth daily. Reported on 02/13/2016    [provider]  Polyethyl Glycol-Propyl Glycol (SYSTANE OP) Place 1-2 drops into both eyes 2 (two) times daily as needed (dry eyes).    [provider]  polyethylene glycol (MIRALAX / GLYCOLAX) packet Take 17 g by mouth 2 (two) times daily. 03/30/17   Danae Orleans, PA-C  primidone (MYSOLINE) 50 MG tablet Take 1/2 tablet PO at bedtime for 1 week, if tolerating increase to 1/2 tablet BID 12/22/16   [place], Jinny Blossom, NP  TOUJEO SOLOSTAR 300 UNIT/ML SOPN Inject 70 Units into the skin 2 (two) times daily. Breakfast and supper 07/31/15   [provider]  TRULICITY 1.5 KK/9.3GH SOPN Inject 1.5 mg into the skin once a week. Friday 08/02/16   [provider]    Family History Family History  Problem Relation Age of Onset  . Cerebral aneurysm Mother   . Tremor Mother   . Alzheimer's disease Father   . Tremor Brother   . Tremor Maternal Uncle   . Diabetes Maternal Grandmother   . Colon cancer Neg Hx     Social History Social History  Substance Use Topics  . Smoking status: Former Smoker    Packs/day: 1.00    Types: Cigarettes    Quit date: 11/25/1983  . Smokeless tobacco: Never Used  . Alcohol use 1.2 oz/week    2 Standard drinks or equivalent per week     Comment: occassionally     Allergies   Patient has no known allergies.   Review of Systems Review of Systems    All other systems reviewed and are negative.    Physical Exam Updated Vital Signs BP 131/69 (BP Location: Right Arm)   Pulse 90   Temp 98.7 F (37.1 C) (Oral)   Resp 20   Wt 276 lb (125.2 kg)   SpO2 100%   BMI 33.60 kg/m   Physical Exam  Constitutional: He is oriented to person, place, and time. He appears well-developed and well-nourished.  Non-toxic appearance. No distress.  HENT:  Head: Normocephalic and atraumatic.  Eyes: Conjunctivae, EOM and lids are normal. Pupils  are equal, round, and reactive to light.  Neck: Normal range of motion. Neck supple. No tracheal deviation present. No thyroid mass present.  Cardiovascular: Normal rate, regular rhythm and normal heart sounds.  Exam reveals no gallop.   No murmur heard. Pulmonary/Chest: Effort normal and breath sounds normal. No stridor. No respiratory distress. He has no decreased breath sounds. He has no wheezes. He has no rhonchi. He has no rales.  Abdominal: Soft. Normal appearance and bowel sounds are normal. He exhibits no distension. There is no tenderness. There is no rebound and no CVA tenderness.  Musculoskeletal: Normal range of motion. He exhibits no edema or tenderness.       Legs: Neurological: He is alert and oriented to person, place, and time. He has normal strength. No cranial nerve deficit or sensory deficit. GCS eye subscore is 4. GCS verbal subscore is 5. GCS motor subscore is 6.  Skin: Skin is warm and dry. No abrasion and no rash noted.  Psychiatric: He has a normal mood and affect. His speech is normal and behavior is normal.  Nursing note and vitals reviewed.    ED Treatments / Results  Labs (all labs ordered are listed, but only abnormal results are displayed) Labs Reviewed  TROPONIN I  BRAIN NATRIURETIC PEPTIDE    EKG  EKG Interpretation  Date/Time:  Thursday Apr 09 2017 17:14:44 EDT Ventricular Rate:  86 PR Interval:    QRS Duration: 114 QT Interval:  376 QTC Calculation: 450 R  Axis:   -56 Text Interpretation:  Sinus rhythm Borderline prolonged PR interval Left anterior fascicular block Abnormal R-wave progression, late transition Confirmed by Flavio Lindroth  MD, Novi Calia ([id number]) on 04/09/2017 7:35:36 PM       Radiology No results found.  Procedures Procedures (including critical care time)  Medications Ordered in ED Medications  0.9 %  sodium chloride infusion (not administered)  sodium chloride 0.9 % bolus 1,000 mL (not administered)  0.9 %  sodium chloride infusion (not administered)     Initial Impression / Assessment and Plan / ED Course  I have reviewed the triage vital signs and the nursing notes.  Pertinent labs & imaging results that were available during my care of the patient were reviewed by me and considered in my medical decision making (see chart for details).     Patient with CT of the chest which is positive for pulmonary embolism. Will start on heparin per pharmacy protocol. Patient to be admitted to the hospitalist service  Final Clinical Impressions(s) / ED Diagnoses   Final diagnoses:  SOB (shortness of breath)    New Prescriptions New Prescriptions   No medications on file     Lacretia Leigh, MD 04/09/17 1936

## 2017-04-09 NOTE — Progress Notes (Signed)
ANTICOAGULATION CONSULT NOTE - Initial Consult  Pharmacy Consult for heparin Indication: pulmonary embolus  No Known Allergies  Patient Measurements: Weight: 276 lb (125.2 kg)   Vital Signs: Temp: 98.7 F (37.1 C) (05/17 1559) Temp Source: Oral (05/17 1559) BP: 131/69 (05/17 1559) Pulse Rate: 90 (05/17 1559)  Labs: No results for input(s): HGB, HCT, PLT, APTT, LABPROT, INR, HEPARINUNFRC, HEPRLOWMOCWT, CREATININE, CKTOTAL, CKMB, TROPONINI in the last 72 hours.  Estimated Creatinine Clearance: 77.3 mL/min (A) (by C-G formula based on SCr of 1.34 mg/dL (H)).   Medical History: Past Medical History:  Diagnosis Date  . Benign essential tremor   . Benign positional vertigo   . Cerebrovascular disease    right parietal stroke  . CVA (cerebral vascular accident) ([place])   . Degenerative arthritis   . Diabetes mellitus   . Dyslipidemia   . GERD (gastroesophageal reflux disease)    hiatal hernia  . Gout   . H/O: vasectomy   . Hx of appendectomy   . Hx of tonsillectomy   . Hypertension   . 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 Melanoma ([place])   . Obesity   . OSA on CPAP     Assessment: 66 year old male with history of recent left knee surgery (03/30/17) presents with several days of shortness of breath as well as hypotension when standing.  CT of the chest is positive for pulmonary embolism.  Heparin per pharmacy.  PTA aspirin 81mg  twice daily.   PTT, PT/INR and CBC ordered STAT   Goal of Therapy:  Heparin level 0.3-0.7 units/ml  Monitor platelets by anticoagulation protocol  Plan:  Per [facility] heparin bolus 3300 units x 1 Start heparin drip at 1900 units/hr Heparin level in 6 hours Daily heparin/ CBC  Dolly Rias RPh 04/09/2017, 7:53 PM Pager [phone]

## 2017-04-09 NOTE — ED Triage Notes (Signed)
Patient here with complaints of 3 episodes of hypotension. Seen at [facility] today for possible dehydration. Total knee replacement 1 week ago.

## 2017-04-09 NOTE — ED Notes (Signed)
Heparin verified by Darcella Cheshire, RN

## 2017-04-09 NOTE — H&P (Signed)
History and Physical    Joshua [name] IRW:[id number] DOB: [birth year] DOA: 04/09/2017  PCP: [name], MD Consultants:  Nadara Mode; Jannifer Franklin- neurology Patient coming from: Hom e=- lives with wife and MIL; Dante: wife, [phone]  Chief Complaint:  SOB and light-headedness  HPI: Joshua [name] is a 66 y.o. male with medical history significant of OSA on CPAP; obesity; HTN; HLD; DM; and CVA with recent left TKR presenting with SOB and light-headedness.  Patient is s/p TKR on 5/7.  He was discharged on 81 mg ASA BID.  He had PT on Thursday nad walked that day without difficulty.  He was doing well with rehab.  He went for his first hard PT on Monday and he rode the bike for about 10 minutes.  When he stood up afterwards, he felt extremely dizzy and nauseated.  BP was very low.  It happened again Tuesday AM even without PT - got up and felt very drowsy, clammy, felt like he would throw up.  Went for PT Wednesday and the same thing happened again.  They called Dr. Virgina Jock and saw him today.  He suggested that he was dehydrated and there might be a PE and so sent him to the ER.  He does take short, shallow breaths during episodes but otherwise not SOB - this started Monday at PT.  He did appear to be SOB with transfers and any movement according to wife as of Monday.     ED Course: Chest CT positive for PE, started on heparin drip  Review of Systems: As per HPI; otherwise review of systems reviewed and negative.   Ambulatory Status:  S/p L TKR  Past Medical History:  Diagnosis Date  . Benign essential tremor   . Benign positional vertigo   . CVA (cerebral vascular accident) ([place])    x2 - L retina, 1 right parietal  . Degenerative arthritis   . Diabetes mellitus   . Dyslipidemia   . GERD (gastroesophageal reflux disease)    hiatal hernia  . Gout   . H/O: vasectomy   . Hx of appendectomy   . Hx of tonsillectomy   . Hypertension   . 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  Melanoma ([place])   . Obesity   . OSA on CPAP    setting = 5    Past Surgical History:  Procedure Laterality Date  . APPENDECTOMY    . arthroscopic knee surgery Bilateral   . CATARACT EXTRACTION Bilateral   . NASAL SEPTUM SURGERY    . TONSILLECTOMY    . TOTAL KNEE ARTHROPLASTY Left 03/30/2017   Procedure: LEFT TOTAL KNEE ARTHROPLASTY;  Surgeon: Paralee Cancel, MD;  Location: [facility];  Service: Orthopedics;  Laterality: Left;    Social History   Social History  . Marital status: Married    Spouse name: [name]  . Number of children: 2  . Years of education: Bachelors    Occupational History  . Substitue teacher [employer]   Social History Main Topics  . Smoking status: Former Smoker    Packs/day: 1.00    Years: 10.00    Types: Cigarettes    Quit date: 11/25/1983  . Smokeless tobacco: Never Used  . Alcohol use 1.2 oz/week    2 Standard drinks or equivalent per week     Comment: occassionally  . Drug use: No  . Sexual activity: Not on file   Other Topics Concern  . Not on file  Social History Narrative   Patient lives at home with wife. [name](   Patient has 2 children that are in good health.    Patient works for [employer]. Retired .   Patient has a Bachelors degree in History.     No Known Allergies  Family History  Problem Relation Age of Onset  . Cerebral aneurysm Mother   . Tremor Mother   . Alzheimer's disease Father   . Tremor Brother   . Tremor Maternal Uncle   . Diabetes Maternal Grandmother   . Colon cancer Neg Hx     Prior to Admission medications   Medication Sig Start Date End Date Taking? Authorizing Provider  aspirin 81 MG chewable tablet Chew 1 tablet (81 mg total) by mouth 2 (two) times daily. Take for 4 weeks, then resume regular dose. 03/31/17 04/30/17 Yes Babish, Rodman Key, PA-C  atorvastatin (LIPITOR) 40 MG tablet Take 40 mg by mouth at bedtime.    Yes [provider]  celecoxib (CELEBREX) 200 MG capsule Take 1  capsule (200 mg total) by mouth every 12 (twelve) hours. 03/31/17  Yes Babish, Rodman Key, PA-C  clindamycin (CLEOCIN) 300 MG capsule Take 300 mg by mouth 3 (three) times daily.   Yes [provider]  clindamycin (CLEOCIN) 300 MG capsule Take 1 capsule (300 mg total) by mouth 3 (three) times daily. 03/26/17  Yes Trula Slade, DPM  docusate sodium (COLACE) 100 MG capsule Take 1 capsule (100 mg total) by mouth 2 (two) times daily. 03/30/17  Yes Babish, Rodman Key, PA-C  escitalopram (LEXAPRO) 20 MG tablet Take 20 mg by mouth at bedtime.   Yes [provider]  fenofibrate 54 MG tablet Take 54 mg by mouth daily.   Yes [provider]  ferrous sulfate (FERROUSUL) 325 (65 FE) MG tablet Take 1 tablet (325 mg total) by mouth 3 (three) times daily with meals. 03/30/17  Yes Babish, Rodman Key, PA-C  glimepiride (AMARYL) 4 MG tablet Take 4 mg by mouth 2 (two) times daily.   Yes [provider]  hydrochlorothiazide (HYDRODIURIL) 25 MG tablet Take 25 mg by mouth daily.   Yes [provider]  HYDROcodone-acetaminophen (NORCO) 7.5-325 MG tablet Take 1-2 tablets by mouth every 4 (four) hours as needed for moderate pain or severe pain. 03/30/17  Yes Babish, Rodman Key, PA-C  losartan (COZAAR) 100 MG tablet Take 100 mg by mouth daily.   Yes [provider]  Lutein-Zeaxanthin 25-5 MG CAPS Take 1 tablet by mouth daily.   Yes [provider]  metFORMIN (GLUCOPHAGE) 1000 MG tablet Take 1,000 mg by mouth 2 (two) times daily with a meal.   Yes [provider]  methocarbamol (ROBAXIN) 500 MG tablet Take 1 tablet (500 mg total) by mouth every 6 (six) hours as needed for muscle spasms. 03/30/17  Yes Babish, Rodman Key, PA-C  Multiple Vitamins-Minerals (MULTIVITAMIN PO) Take 1 tablet by mouth daily.    Yes [provider]  Omega-3 Fatty Acids (FISH OIL) 1000 MG CAPS Take 2,000 mg by mouth daily. Reported on 02/13/2016   Yes [provider]  Polyethyl  Glycol-Propyl Glycol (SYSTANE OP) Place 1-2 drops into both eyes 2 (two) times daily as needed (dry eyes).   Yes [provider]  polyethylene glycol (MIRALAX / GLYCOLAX) packet Take 17 g by mouth 2 (two) times daily. 03/30/17  Yes Babish, Rodman Key, PA-C  primidone (MYSOLINE) 50 MG tablet Take 1/2 tablet PO at bedtime for 1 week, if tolerating increase to 1/2 tablet BID  12/22/16  Yes Millikan, Megan, NP  TOUJEO SOLOSTAR 300 UNIT/ML SOPN Inject 70 Units into the skin 2 (two) times daily. Breakfast and supper 07/31/15  Yes [provider]  TRULICITY 1.5 FX/9.0WI SOPN Inject 1.5 mg into the skin once a week. Friday 08/02/16  Yes [provider]    Physical Exam: Vitals:   04/09/17 1559 04/09/17 1949 04/09/17 2202 04/10/17 0010  BP: 131/69 128/77 (!) 163/85   Pulse: 90 85 95   Resp: 20 20 19 18   Temp: 98.7 F (37.1 C)  98.6 F (37 C)   TempSrc: Oral  Oral   SpO2: 100% 98% 98%   Weight: 125.2 kg (276 lb)  125.6 kg (276 lb 12.8 oz)   Height:   6\' 4"  (1.93 m)      General:  Appears calm and comfortable and is NAD Eyes:  PERRL, EOMI, normal lids, iris ENT:  grossly normal hearing, lips & tongue, mmm Neck:  no LAD, masses or thyromegaly Cardiovascular:  RRR, no m/r/g. No LE edema.  Respiratory:  CTA bilaterally, no w/r/r. Normal respiratory effort. Abdomen:  soft, ntnd, NABS Skin:  no rash or induration seen on limited exam Musculoskeletal:  Left knee bandage is C/D/I.  The entire left leg from thigh to foot is edematous and much large in size than the right with diffuse erythema and posterior cords Psychiatric:  grossly normal mood and affect, speech fluent and appropriate, AOx3 Neurologic:  CN 2-12 grossly intact, moves all extremities in coordinated fashion, sensation intact  Labs on Admission: I have personally reviewed following labs and imaging studies  CBC:  Recent Labs Lab 04/09/17 2125  WBC 10.5  HGB 10.9*  HCT 33.0*  MCV 88.7  PLT 097   Basic  Metabolic Panel: No results for input(s): NA, K, CL, CO2, GLUCOSE, BUN, CREATININE, CALCIUM, MG, PHOS in the last 168 hours. GFR: Estimated Creatinine Clearance: 77.4 mL/min (A) (by C-G formula based on SCr of 1.34 mg/dL (H)). Liver Function Tests: No results for input(s): AST, ALT, ALKPHOS, BILITOT, PROT, ALBUMIN in the last 168 hours. No results for input(s): LIPASE, AMYLASE in the last 168 hours. No results for input(s): AMMONIA in the last 168 hours. Coagulation Profile:  Recent Labs Lab 04/09/17 2125  INR 1.16   Cardiac Enzymes:  Recent Labs Lab 04/09/17 2125  TROPONINI <0.03   BNP (last 3 results) No results for input(s): PROBNP in the last 8760 hours. HbA1C: No results for input(s): HGBA1C in the last 72 hours. CBG:  Recent Labs Lab 04/09/17 2310  GLUCAP 128*   Lipid Profile: No results for input(s): CHOL, HDL, LDLCALC, TRIG, CHOLHDL, LDLDIRECT in the last 72 hours. Thyroid Function Tests: No results for input(s): TSH, T4TOTAL, FREET4, T3FREE, THYROIDAB in the last 72 hours. Anemia Panel: No results for input(s): VITAMINB12, FOLATE, FERRITIN, TIBC, IRON, RETICCTPCT in the last 72 hours. Urine analysis:    Component Value Date/Time   COLORURINE YELLOW 04/08/2011 0732   APPEARANCEUR CLEAR 04/08/2011 0732   LABSPEC 1.013 04/08/2011 0732   PHURINE 7.5 04/08/2011 0732   GLUCOSEU 250 (A) 04/08/2011 0732   HGBUR NEGATIVE 04/08/2011 0732   BILIRUBINUR NEGATIVE 04/08/2011 0732   KETONESUR NEGATIVE 04/08/2011 0732   PROTEINUR 100 (A) 04/08/2011 0732   UROBILINOGEN 0.2 04/08/2011 0732   NITRITE NEGATIVE 04/08/2011 0732   LEUKOCYTESUR NEGATIVE 04/08/2011 0732    Creatinine Clearance: Estimated Creatinine Clearance: 77.4 mL/min (A) (by C-G formula based on SCr of 1.34 mg/dL (H)).  Sepsis Labs: @LABRCNTIP (procalcitonin:4,lacticidven:4) )No  results found for this or any previous visit (from the past 240 hour(s)).   Radiological Exams on Admission: Ct Angio  Chest Pe W Or Wo Contrast  Result Date: 04/09/2017 CLINICAL DATA:  Dyspnea status post left knee replacement 1 week ago. EXAM: CT ANGIOGRAPHY CHEST WITH CONTRAST TECHNIQUE: Multidetector CT imaging of the chest was performed using the standard protocol during bolus administration of intravenous contrast. Multiplanar CT image reconstructions and MIPs were obtained to evaluate the vascular anatomy. CONTRAST:  100 cc Isovue 370 IV COMPARISON:  None. FINDINGS: Cardiovascular: Acute nonocclusive thrombus noted to the right middle lobe within segmental branch, series 9, image 147 and coronal series 11, image 73. RV/LV ratio equals 0.93 consistent with right heart strain. Normal heart size without pericardial effusion. Coronary arteriosclerosis along the LAD. There is aortic atherosclerosis without aortic aneurysm. Mediastinum/Nodes: No enlarged mediastinal, hilar, or axillary lymph nodes. Thyroid gland, trachea, and esophagus demonstrate no significant findings. Lungs/Pleura: Lungs are free of pulmonary infarcts and pneumonic consolidations. Bibasilar atelectasis. No dominant mass. No pleural effusion or pneumothorax. Upper Abdomen: No acute abnormality. Musculoskeletal: No chest wall abnormality. Diffuse idiopathic skeletal hyperostosis of the dorsal spine. No acute or significant osseous findings. Review of the MIP images confirms the above findings. IMPRESSION: Positive for acute PE with CT evidence of right heart strain (RV/LV Ratio = 0.93) consistent with at least submassive (intermediate risk) PE. The presence of right heart strain has been associated with an increased risk of morbidity and mortality. Please activate Code PE by paging [phone]. Critical Value/emergent results were called by telephone at the time of interpretation on 04/09/2017 at 7:28 pm to Dr. Lacretia Leigh , who verbally acknowledged these results. Coronary arteriosclerosis and aortic atherosclerosis. Diffuse idiopathic skeletal hyperostosis  of the dorsal spine. Electronically Signed   By: Ashley Royalty M.D.   On: 04/09/2017 19:28    EKG: Independently reviewed.  NSR with rate 86; LAFB with no evidence of acute ischemia  Assessment/Plan Principal Problem:   PE (pulmonary thromboembolism) ([facility]) Active Problems:   Diabetes mellitus type 2 in obese ([facility])   S/P left TKA   OSA on CPAP   Essential hypertension   PE -Patient with recent L TKR now presenting with new DVT/PE -PE seen on CT; DVT is presumptive based on physical exam -Source is recent L TKR - despite discharge on 81 mg ASA BID -He will need 3-6 months anticoagulation for this initial VTE -Hgb 10.9, stable -INR 1.16 -Will admit on telemetry -Initiate anticoagulation - for now, will start Heparin drip -He is likely able to transition to alternative [facility] agent tomorrow -We discussed risks/benefits of various options -Coumadin is very inexpensive; however, it requires frequent physician visits and this seemed less appealing to him and his wife -NOAC therapy is more likely appropriate in this situation.  The patient and his wife would like to meet with the case manager tomorrow to see which medication will be best for them. -I discussed at length with the patient and his wife the risks/benefits of anticoagulation and why this was the treatment plan chosen and they are in agreement with this plan.  S/p L TKR -Patient was doing well with his rehab prior to this complication -Will need ongoing therapy once his clot is stabilized -Continue Norco and Robaxin for pain  DM -Glucose 128 -Hold Amaryl, Trulicity -Continue Toujeo -Cover with SSI  OSA -Continue CPAP  HTN -Continue HCTZ, Cozaar  DVT prophylaxis: Heparin drip Code Status:  Full - confirmed with patient/family Family  Communication: Wife present throughout evaluation  Disposition Plan:  Home once clinically improved Consults called: None  Admission status: It is my clinical opinion that referral for  OBSERVATION is reasonable and necessary in this patient based on the above information provided. The aforementioned taken together are felt to place the patient at high risk for further clinical deterioration. However it is anticipated that the patient may be medically stable for discharge from the hospital within 24 to 48 hours.    Karmen Bongo MD [facility]  If 7PM-7AM, please contact night-coverage [email or web address] Password [email or web address]  04/10/2017, 1:34 AM

## 2017-04-10 ENCOUNTER — Observation Stay (HOSPITAL_BASED_OUTPATIENT_CLINIC_OR_DEPARTMENT_OTHER): Payer: Medicare Other

## 2017-04-10 DIAGNOSIS — K219 Gastro-esophageal reflux disease without esophagitis: Secondary | ICD-10-CM | POA: Diagnosis present

## 2017-04-10 DIAGNOSIS — I10 Essential (primary) hypertension: Secondary | ICD-10-CM | POA: Diagnosis present

## 2017-04-10 DIAGNOSIS — Z9989 Dependence on other enabling machines and devices: Secondary | ICD-10-CM | POA: Diagnosis not present

## 2017-04-10 DIAGNOSIS — I951 Orthostatic hypotension: Secondary | ICD-10-CM | POA: Diagnosis present

## 2017-04-10 DIAGNOSIS — E1169 Type 2 diabetes mellitus with other specified complication: Secondary | ICD-10-CM | POA: Diagnosis not present

## 2017-04-10 DIAGNOSIS — Z820 Family history of epilepsy and other diseases of the nervous system: Secondary | ICD-10-CM | POA: Diagnosis not present

## 2017-04-10 DIAGNOSIS — I5030 Unspecified diastolic (congestive) heart failure: Secondary | ICD-10-CM

## 2017-04-10 DIAGNOSIS — I2510 Atherosclerotic heart disease of native coronary artery without angina pectoris: Secondary | ICD-10-CM | POA: Diagnosis present

## 2017-04-10 DIAGNOSIS — E119 Type 2 diabetes mellitus without complications: Secondary | ICD-10-CM | POA: Diagnosis present

## 2017-04-10 DIAGNOSIS — Y92239 Unspecified place in hospital as the place of occurrence of the external cause: Secondary | ICD-10-CM | POA: Diagnosis present

## 2017-04-10 DIAGNOSIS — Z7984 Long term (current) use of oral hypoglycemic drugs: Secondary | ICD-10-CM | POA: Diagnosis not present

## 2017-04-10 DIAGNOSIS — T81718A Complication of other artery following a procedure, not elsewhere classified, initial encounter: Secondary | ICD-10-CM | POA: Diagnosis present

## 2017-04-10 DIAGNOSIS — Z85820 Personal history of malignant melanoma of skin: Secondary | ICD-10-CM | POA: Diagnosis not present

## 2017-04-10 DIAGNOSIS — M7989 Other specified soft tissue disorders: Secondary | ICD-10-CM

## 2017-04-10 DIAGNOSIS — E785 Hyperlipidemia, unspecified: Secondary | ICD-10-CM | POA: Diagnosis present

## 2017-04-10 DIAGNOSIS — Z96652 Presence of left artificial knee joint: Secondary | ICD-10-CM | POA: Diagnosis present

## 2017-04-10 DIAGNOSIS — Z7982 Long term (current) use of aspirin: Secondary | ICD-10-CM | POA: Diagnosis not present

## 2017-04-10 DIAGNOSIS — I2699 Other pulmonary embolism without acute cor pulmonale: Secondary | ICD-10-CM | POA: Diagnosis not present

## 2017-04-10 DIAGNOSIS — Z79899 Other long term (current) drug therapy: Secondary | ICD-10-CM | POA: Diagnosis not present

## 2017-04-10 DIAGNOSIS — Z87891 Personal history of nicotine dependence: Secondary | ICD-10-CM | POA: Diagnosis not present

## 2017-04-10 DIAGNOSIS — G4733 Obstructive sleep apnea (adult) (pediatric): Secondary | ICD-10-CM

## 2017-04-10 DIAGNOSIS — G250 Essential tremor: Secondary | ICD-10-CM | POA: Diagnosis present

## 2017-04-10 DIAGNOSIS — Z8673 Personal history of transient ischemic attack (TIA), and cerebral infarction without residual deficits: Secondary | ICD-10-CM | POA: Diagnosis not present

## 2017-04-10 DIAGNOSIS — Z6833 Body mass index (BMI) 33.0-33.9, adult: Secondary | ICD-10-CM | POA: Diagnosis not present

## 2017-04-10 DIAGNOSIS — Y838 Other surgical procedures as the cause of abnormal reaction of the patient, or of later complication, without mention of misadventure at the time of the procedure: Secondary | ICD-10-CM | POA: Diagnosis present

## 2017-04-10 DIAGNOSIS — E669 Obesity, unspecified: Secondary | ICD-10-CM | POA: Diagnosis present

## 2017-04-10 LAB — URINALYSIS, ROUTINE W REFLEX MICROSCOPIC
Bacteria, UA: NONE SEEN
Bilirubin Urine: NEGATIVE
Glucose, UA: NEGATIVE mg/dL
Hgb urine dipstick: NEGATIVE
KETONES UR: NEGATIVE mg/dL
Leukocytes, UA: NEGATIVE
Nitrite: NEGATIVE
PROTEIN: 30 mg/dL — AB
SQUAMOUS EPITHELIAL / LPF: NONE SEEN
Specific Gravity, Urine: 1.014 (ref 1.005–1.030)
pH: 7 (ref 5.0–8.0)

## 2017-04-10 LAB — BASIC METABOLIC PANEL
Anion gap: 8 (ref 5–15)
BUN: 28 mg/dL — ABNORMAL HIGH (ref 6–20)
CO2: 25 mmol/L (ref 22–32)
Calcium: 9 mg/dL (ref 8.9–10.3)
Chloride: 105 mmol/L (ref 101–111)
Creatinine, Ser: 1.32 mg/dL — ABNORMAL HIGH (ref 0.61–1.24)
GFR calc Af Amer: >60 mL/min (ref >60)
GFR, EST NON AFRICAN AMERICAN: 54 mL/min — AB (ref >60)
GLUCOSE: 92 mg/dL (ref 65–99)
POTASSIUM: 4.1 mmol/L (ref 3.5–5.1)
Sodium: 138 mmol/L (ref 135–145)

## 2017-04-10 LAB — GLUCOSE, CAPILLARY
GLUCOSE-CAPILLARY: 92 mg/dL (ref 65–99)
GLUCOSE-CAPILLARY: 100 mg/dL — AB (ref 65–99)
Glucose-Capillary: 101 mg/dL — ABNORMAL HIGH (ref 65–99)
Glucose-Capillary: 100 mg/dL — ABNORMAL HIGH (ref 65–99)

## 2017-04-10 LAB — CBC
HCT: 31.5 % — ABNORMAL LOW (ref 39.0–52.0)
Hemoglobin: 10.3 g/dL — ABNORMAL LOW (ref 13.0–17.0)
MCH: 29.1 pg (ref 26.0–34.0)
MCHC: 32.7 g/dL (ref 30.0–36.0)
MCV: 89 fL (ref 78.0–100.0)
Platelets: 292 K/uL (ref 150–400)
RBC: 3.54 MIL/uL — ABNORMAL LOW (ref 4.22–5.81)
RDW: 13.1 % (ref 11.5–15.5)
WBC: 9.8 K/uL (ref 4.0–10.5)

## 2017-04-10 LAB — HEPARIN LEVEL (UNFRACTIONATED): HEPARIN UNFRACTIONATED: 0.2 [IU]/mL — AB (ref 0.30–0.70)

## 2017-04-10 LAB — ECHOCARDIOGRAM COMPLETE
Height: 76 in
WEIGHTICAEL: 4428.8 [oz_av]

## 2017-04-10 MED ORDER — PERFLUTREN LIPID MICROSPHERE
1.0000 mL | INTRAVENOUS | Status: AC | PRN
  Administered 2017-04-10: 2 mL via INTRAVENOUS
  Filled 2017-04-10: qty 10

## 2017-04-10 MED ORDER — PERFLUTREN LIPID MICROSPHERE
INTRAVENOUS | Status: AC
  Filled 2017-04-10: qty 10

## 2017-04-10 MED ORDER — HEPARIN BOLUS VIA INFUSION
2000.0000 [IU] | Freq: Once | INTRAVENOUS | Status: AC
  Administered 2017-04-10: 2000 [IU] via INTRAVENOUS
  Filled 2017-04-10: qty 2000

## 2017-04-10 MED ORDER — HEPARIN (PORCINE) IN NACL 100-0.45 UNIT/ML-% IJ SOLN
2200.0000 [IU]/h | INTRAMUSCULAR | Status: DC
  Administered 2017-04-10 (×2): 2200 [IU]/h via INTRAVENOUS
  Filled 2017-04-10: qty 250

## 2017-04-10 MED ORDER — ENOXAPARIN (LOVENOX) PATIENT EDUCATION KIT
PACK | Freq: Once | Status: AC
  Administered 2017-04-10: 13:00:00
  Filled 2017-04-10: qty 1

## 2017-04-10 MED ORDER — FERROUS SULFATE 325 (65 FE) MG PO TABS: 325.0000 mg | ORAL_TABLET | Freq: Every day | ORAL | 3 refills | Status: DC

## 2017-04-10 MED ORDER — ENOXAPARIN SODIUM 120 MG/0.8ML ~~LOC~~ SOLN
120.0000 mg | Freq: Once | SUBCUTANEOUS | Status: AC
  Administered 2017-04-10: 120 mg via SUBCUTANEOUS
  Filled 2017-04-10: qty 0.8

## 2017-04-10 MED ORDER — ASPIRIN 81 MG PO CHEW: 81.0000 mg | CHEWABLE_TABLET | Freq: Every day | ORAL | 0 refills | Status: DC

## 2017-04-10 MED ORDER — PRIMIDONE 50 MG PO TABS
25.0000 mg | ORAL_TABLET | Freq: Every day | ORAL | Status: DC
  Administered 2017-04-10 (×2): 25 mg via ORAL
  Filled 2017-04-10 (×2): qty 0.5

## 2017-04-10 MED ORDER — ENOXAPARIN SODIUM 120 MG/0.8ML ~~LOC~~ SOLN: 120.0000 mg | Freq: Two times a day (BID) | SUBCUTANEOUS | 0 refills | Status: DC

## 2017-04-10 MED ORDER — ENOXAPARIN SODIUM 120 MG/0.8ML ~~LOC~~ SOLN
120.0000 mg | Freq: Two times a day (BID) | SUBCUTANEOUS | Status: DC
  Administered 2017-04-10 – 2017-04-11 (×2): 120 mg via SUBCUTANEOUS
  Filled 2017-04-10 (×2): qty 0.8

## 2017-04-10 NOTE — Progress Notes (Signed)
**  Preliminary report by tech**  Bilateral lower extremity venous duplex complete. There is evidence of acute deep vein thrombosis involving the posterior tibial and peroneal veins of the left lower extremity. There is no evidence of superficial vein thrombosis involving the left lower extremity. There is no evidence of deep or superficial vein thrombosis involving the right lower extremity. There is no evidence of a Baker's cyst bilaterally. Results were given to Dr. Erlinda Hong.  04/10/17 3:09 PM Carlos Levering RVT

## 2017-04-10 NOTE — Progress Notes (Signed)
  Echocardiogram 2D Echocardiogram with Definitiy has been performed.  Darlina Sicilian M 04/10/2017, 12:38 PM

## 2017-04-10 NOTE — Progress Notes (Signed)
ANTICOAGULATION CONSULT NOTE - f/u Consult  Pharmacy Consult for heparin Indication: pulmonary embolus  No Known Allergies  Patient Measurements: Height: 6\' 4"  (193 cm) Weight: 276 lb 12.8 oz (125.6 kg) IBW/kg (Calculated) : 86.8   Vital Signs: Temp: 98.6 F (37 C) (05/17 2202) Temp Source: Oral (05/17 2202) BP: 163/85 (05/17 2202) Pulse Rate: 95 (05/17 2202)  Labs:  Recent Labs  04/09/17 2125 04/10/17 0310  HGB 10.9* 10.3*  HCT 33.0* 31.5*  PLT 280 292  APTT 39*  --   LABPROT 14.9  --   INR 1.16  --   HEPARINUNFRC  --  0.20*  TROPONINI <0.03  --     Estimated Creatinine Clearance: 77.4 mL/min (A) (by C-G formula based on SCr of 1.34 mg/dL (H)).   Medical History: Past Medical History:  Diagnosis Date  . Benign essential tremor   . Benign positional vertigo   . CVA (cerebral vascular accident) ([place])    x2 - L retina, 1 right parietal  . Degenerative arthritis   . Diabetes mellitus   . Dyslipidemia   . GERD (gastroesophageal reflux disease)    hiatal hernia  . Gout   . H/O: vasectomy   . Hx of appendectomy   . Hx of tonsillectomy   . Hypertension   . 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 Melanoma ([place])   . Obesity   . OSA on CPAP    setting = 5    Assessment: 66 year old male with history of recent left knee surgery (03/30/17) presents with several days of shortness of breath as well as hypotension when standing.  CT of the chest is positive for pulmonary embolism.  Heparin per pharmacy.  PTA aspirin 81mg  twice daily.   PTT, PT/INR and CBC ordered STAT Today, 5/18 0310 HL=0.20 units/ml subtherapeutic, no infusion or bleeding issues per RN   Goal of Therapy:  Heparin level 0.3-0.7 units/ml  Monitor platelets by anticoagulation protocol  Plan:  Rebolus with 2000 units now Increase heparin drip to 2200 units/hr Heparin level in 6 hours Daily heparin/ CBC  Dorrene German 04/10/2017, 4:15 AM

## 2017-04-10 NOTE — Progress Notes (Signed)
PROGRESS NOTE  Joshua [name]:[id number] DOB: [birth year] DOA: 04/09/2017 PCP: [name], MD  HPI/Recap of past 24 hours:  Remain feeling dizzy, weak and sob when ambulating Denies chest pain, no cough, no fever  Assessment/Plan: Principal Problem:   PE (pulmonary thromboembolism) ([place]) Active Problems:   Diabetes mellitus type 2 in obese ([place])   S/P left TKA   OSA on CPAP   Essential hypertension   PE -Patient with recent L TKR now presenting with new DVT/PE -PE seen on CT;  -he is started on Heparin drip since admission, after discussion, patient prefer to transition to subQ lovenox -echo/venous doppler order to complete the work up  S/p L TKR -Patient was doing well with his rehab prior to this complication -Will need ongoing therapy once his clot is stabilized -Continue Norco and Robaxin for pain  Insulin dependent DM2 -Glucose 128 -Hold Amaryl, Trulicity -Continue Toujeo -Cover with SSI  OSA -Continue CPAP  HTN -Continue Cozaar, hold hctz due to mild orthostatic hypotension     DVT prophylaxis: Heparin drip Code Status:  Full - confirmed with patient/family Family Communication: Wife present throughout evaluation  Disposition Plan:  Home with home health Consults called: None    Procedures:  none  Antibiotics:  none   Objective: BP (!) 156/83 (BP Location: Right Arm)   Pulse 89   Temp 98.5 F (36.9 C) (Oral)   Resp 20   Ht 6\' 4"  (1.93 m)   Wt 125.6 kg (276 lb 12.8 oz)   SpO2 96%   BMI 33.69 kg/m   Intake/Output Summary (Last 24 hours) at 04/10/17 1947 Last data filed at 04/10/17 1945  Gross per 24 hour  Intake          1661.84 ml  Output             1625 ml  Net            36.84 ml   Filed Weights   04/09/17 1559 04/09/17 2202  Weight: 125.2 kg (276 lb) 125.6 kg (276 lb 12.8 oz)    Exam:   General:  Frail, weak,   Cardiovascular: RRR  Respiratory: CTABL  Abdomen: Soft/ND/NT, positive BS  Musculoskeletal:  left lower extremity edema  Neuro: aaox3  Data Reviewed: Basic Metabolic Panel:  Recent Labs Lab 04/10/17 0310  NA 138  K 4.1  CL 105  CO2 25  GLUCOSE 92  BUN 28*  CREATININE 1.32*  CALCIUM 9.0   Liver Function Tests: No results for input(s): AST, ALT, ALKPHOS, BILITOT, PROT, ALBUMIN in the last 168 hours. No results for input(s): LIPASE, AMYLASE in the last 168 hours. No results for input(s): AMMONIA in the last 168 hours. CBC:  Recent Labs Lab 04/09/17 2125 04/10/17 0310  WBC 10.5 9.8  HGB 10.9* 10.3*  HCT 33.0* 31.5*  MCV 88.7 89.0  PLT 280 292   Cardiac Enzymes:    Recent Labs Lab 04/09/17 2125  TROPONINI <0.03   BNP (last 3 results)  Recent Labs  04/09/17 2125  BNP 14.6    ProBNP (last 3 results) No results for input(s): PROBNP in the last 8760 hours.  CBG:  Recent Labs Lab 04/09/17 2310 04/10/17 0753 04/10/17 1200 04/10/17 1659  GLUCAP 128* 92 101* 100*    No results found for this or any previous visit (from the past 240 hour(s)).   Studies: No results found.  Scheduled Meds: . aspirin  81 mg Oral Daily  . atorvastatin  40 mg  Oral QHS  . docusate sodium  100 mg Oral BID  . enoxaparin (LOVENOX) injection  120 mg Subcutaneous Q12H  . escitalopram  20 mg Oral QHS  . fenofibrate  54 mg Oral Daily  . insulin aspart  0-15 Units Subcutaneous TID WC  . insulin aspart  0-5 Units Subcutaneous QHS  . insulin glargine  70 Units Subcutaneous BID  . losartan  100 mg Oral Daily  . polyethylene glycol  17 g Oral BID  . primidone  25 mg Oral QHS  . sodium chloride flush  3 mL Intravenous Q12H    Continuous Infusions:   Time spent: 3mins  Yoav Okane MD, PhD  [facility] Pager [phone]. If 7PM-7AM, please contact night-coverage at [email or web address], password [facility] 04/10/2017, 7:47 PM  LOS: 0 days

## 2017-04-10 NOTE — Progress Notes (Signed)
ANTICOAGULATION CONSULT NOTE  Pharmacy Consult for heparin  Indication: pulmonary embolus  No Known Allergies  Patient Measurements: Height: 6' 4"  (193 cm) Weight: 276 lb 12.8 oz (125.6 kg) IBW/kg (Calculated) : 86.8   Vital Signs: Temp: 98.4 F (36.9 C) (05/18 0433) Temp Source: Oral (05/18 0433) BP: 156/83 (05/18 0433) Pulse Rate: 89 (05/18 0433)  Labs:  Recent Labs  04/09/17 2125 04/10/17 0310  HGB 10.9* 10.3*  HCT 33.0* 31.5*  PLT 280 292  APTT 39*  --   LABPROT 14.9  --   INR 1.16  --   HEPARINUNFRC  --  0.20*  CREATININE  --  1.32*  TROPONINI <0.03  --     Estimated Creatinine Clearance: 78.6 mL/min (A) (by C-G formula based on SCr of 1.32 mg/dL (H)).   Medical History: Past Medical History:  Diagnosis Date  . Benign essential tremor   . Benign positional vertigo   . CVA (cerebral vascular accident) ([place])    x2 - L retina, 1 right parietal  . Degenerative arthritis   . Diabetes mellitus   . Dyslipidemia   . GERD (gastroesophageal reflux disease)    hiatal hernia  . Gout   . H/O: vasectomy   . Hx of appendectomy   . Hx of tonsillectomy   . Hypertension   . 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 Melanoma ([place])   . Obesity   . OSA on CPAP    setting = 5    Assessment: 66 year old male with history of recent left knee surgery (03/30/17) discharged on ASA 71m PO BID for VTE prophylaxis [facility] with several days of shortness of breath as well as hypotension when standing.  CTa of chest + for pulmonary embolism.  Pharmacy consulted for dosing of heparin infusion. Today, patient transitioned to Enoxaparin after discussion of various anticoagulation options with MD, patient, and family. Of note, patient on primidone which can interact with the DOACs (primidone is an inducer, so can decrease efficacy). Patient does not want to initiate warfarin at this time, as it would be difficult for him to go in to MD office frequently  for INR checks.   Today, 04/10/17: SCr 1.32 with CrCl ~ 79 ml/min CBC: Hgb 10.3, Pltc WNL No bleeding issues currently reported per RN  Goal of Therapy:  Anti-Xa level 0.6-1 units/ml 4hrs after LMWH dose given  Monitor platelets by anticoagulation protocol  Plan:   Stop heparin infusion now.  Give Enoxaparin 120 mg SQ x 1 one hour after heparin infusion stopped.   Then continue with Enoxaparin 120 mg SQ q12h starting at 2200 tonight.   Discussed rounding down Enoxaparin dose slightly with Dr. XErlinda Hongin order for patient to use full syringe size.   Have ordered Enoxaparin education kit.  Monitor closely for s/sx of bleeding.    JLindell Spar PharmD, BCPS Pager: [phone]/18/2018 11:26 AM

## 2017-04-10 NOTE — Evaluation (Signed)
Physical Therapy Evaluation Patient Details Name: Joshua [name] MRN: [id number] DOB: [birth year] Today's Date: 04/10/2017   History of Present Illness   Nikolos A Antosh is a 66 y.o. male with medical history significant of OSA on CPAP; obesity; HTN; HLD; DM; and CVA with recent left TKR presenting with SOB and light-headedness.  Patient is s/p TKR on 5/7, now dx with PE  Clinical Impression  Pt admitted with above diagnosis. Pt currently with functional limitations due to the deficits listed below (see PT Problem List).  Pt will benefit from skilled PT to increase their independence and safety with mobility to allow discharge to the venue listed below.  Recommend pt return to OP PT at d/C; will follow in acute setting     Follow Up Recommendations Outpatient PT    Equipment Recommendations  None recommended by PT    Recommendations for Other Services       Precautions / Restrictions Precautions Precautions: Fall;Knee Restrictions Other Position/Activity Restrictions: WBAT      Mobility  Bed Mobility Overal bed mobility: Modified Independent             General bed mobility comments: incr time  Transfers Overall transfer level: Needs assistance Equipment used: Rolling walker (2 wheeled) Transfers: Sit to/from Stand Sit to Stand: Min guard;From elevated surface         General transfer comment: cues for hand placement  Ambulation/Gait Ambulation/Gait assistance: Supervision;Min guard Ambulation Distance (Feet): 50 Feet Assistive device: Rolling walker (2 wheeled) Gait Pattern/deviations: Step-to pattern;Step-through pattern;Decreased stride length Gait velocity: decreased   General Gait Details: cues for knee flexion during gait; incr WOB with O2 sats 97% to 99% on RA  Stairs            Wheelchair Mobility    Modified Rankin (Stroke Patients Only)       Balance Overall balance assessment: Needs assistance   Sitting balance-Leahy Scale: Good        Standing balance-Leahy Scale: Fair                               Pertinent Vitals/Pain Pain Assessment: No/denies pain    Home Living Family/patient expects to be discharged to:: Private residence Living Arrangements: Spouse/significant other Available Help at Discharge: Family Type of Home: House Home Access: Stairs to enter Entrance Stairs-Rails: Can reach both Entrance Stairs-Number of Steps: 4 Home Layout: One level Home Equipment: Bedside commode;Shower seat - built in;Grab bars - tub/shower      Prior Function Level of Independence: Independent               Hand Dominance        Extremity/Trunk Assessment   Upper Extremity Assessment Upper Extremity Assessment: Overall WFL for tasks assessed    Lower Extremity Assessment LLE Deficits / Details: knee grossly 3+/5; AROM ~6* to 95* knee flexion       Communication   Communication: No difficulties  Cognition Arousal/Alertness: Awake/alert Behavior During Therapy: WFL for tasks assessed/performed Overall Cognitive Status: Within Functional Limits for tasks assessed                                        General Comments      Exercises     Assessment/Plan    PT Assessment Patient needs continued PT services  PT Problem List Decreased  strength;Decreased range of motion;Decreased activity tolerance;Decreased mobility;Decreased knowledge of precautions;Decreased knowledge of use of DME       PT Treatment Interventions DME instruction;Gait training;Stair training;Functional mobility training;Therapeutic activities;Therapeutic exercise;Patient/family education    PT Goals (Current goals can be found in the Care Plan section)  Acute Rehab PT Goals Patient Stated Goal: get better PT Goal Formulation: With patient Time For Goal Achievement: 04/15/17 Potential to Achieve Goals: Good    Frequency 7X/week   Barriers to discharge        Co-evaluation                AM-PAC PT "6 Clicks" Daily Activity  Outcome Measure Difficulty turning over in bed (including adjusting bedclothes, sheets and blankets)?: A Little Difficulty moving from lying on back to sitting on the side of the bed? : A Little Difficulty sitting down on and standing up from a chair with arms (e.g., wheelchair, bedside commode, etc,.)?: A Little Help needed moving to and from a bed to chair (including a wheelchair)?: A Little Help needed walking in hospital room?: A Little Help needed climbing 3-5 steps with a railing? : A Little 6 Click Score: 18    End of Session Equipment Utilized During Treatment: Gait belt Activity Tolerance: Patient tolerated treatment well Patient left: in chair;with call bell/phone within reach;with family/visitor present   PT Visit Diagnosis: Difficulty in walking, not elsewhere classified (R26.2)    Time: 2257-5051 PT Time Calculation (min) (ACUTE ONLY): 18 min   Charges:   PT Evaluation $PT Eval Low Complexity: 1 Procedure     PT G Codes:   PT G-Codes **NOT FOR INPATIENT CLASS** Functional Assessment Tool Used: AM-PAC 6 Clicks Basic Mobility;Clinical judgement Functional Limitation: Mobility: Walking and moving around Mobility: Walking and Moving Around Current Status (G3358): At least 1 percent but less than 20 percent impaired, limited or restricted Mobility: Walking and Moving Around Goal Status [phone]): At least 1 percent but less than 20 percent impaired, limited or restricted      [facility] 04/10/2017, 11:56 AM

## 2017-04-11 ENCOUNTER — Encounter (HOSPITAL_COMMUNITY): Payer: Self-pay | Admitting: Family Medicine

## 2017-04-11 DIAGNOSIS — Z9989 Dependence on other enabling machines and devices: Secondary | ICD-10-CM

## 2017-04-11 DIAGNOSIS — E1169 Type 2 diabetes mellitus with other specified complication: Secondary | ICD-10-CM

## 2017-04-11 DIAGNOSIS — I10 Essential (primary) hypertension: Secondary | ICD-10-CM

## 2017-04-11 DIAGNOSIS — G4733 Obstructive sleep apnea (adult) (pediatric): Secondary | ICD-10-CM

## 2017-04-11 DIAGNOSIS — E669 Obesity, unspecified: Secondary | ICD-10-CM

## 2017-04-11 DIAGNOSIS — Z96652 Presence of left artificial knee joint: Secondary | ICD-10-CM

## 2017-04-11 LAB — BASIC METABOLIC PANEL
Anion gap: 10 (ref 5–15)
BUN: 23 mg/dL — AB (ref 6–20)
CHLORIDE: 101 mmol/L (ref 101–111)
CO2: 26 mmol/L (ref 22–32)
CREATININE: 1.35 mg/dL — AB (ref 0.61–1.24)
Calcium: 9.2 mg/dL (ref 8.9–10.3)
GFR calc Af Amer: >60 mL/min (ref >60)
GFR calc non Af Amer: 53 mL/min — ABNORMAL LOW (ref >60)
GLUCOSE: 110 mg/dL — AB (ref 65–99)
POTASSIUM: 4.3 mmol/L (ref 3.5–5.1)
SODIUM: 137 mmol/L (ref 135–145)

## 2017-04-11 LAB — CBC
HCT: 33.3 % — ABNORMAL LOW (ref 39.0–52.0)
HEMOGLOBIN: 10.9 g/dL — AB (ref 13.0–17.0)
MCH: 28.9 pg (ref 26.0–34.0)
MCHC: 32.7 g/dL (ref 30.0–36.0)
MCV: 88.3 fL (ref 78.0–100.0)
Platelets: 294 10*3/uL (ref 150–400)
RBC: 3.77 MIL/uL — ABNORMAL LOW (ref 4.22–5.81)
RDW: 13.1 % (ref 11.5–15.5)
WBC: 9.2 10*3/uL (ref 4.0–10.5)

## 2017-04-11 LAB — GLUCOSE, CAPILLARY
GLUCOSE-CAPILLARY: 147 mg/dL — AB (ref 65–99)
Glucose-Capillary: 94 mg/dL (ref 65–99)

## 2017-04-11 LAB — TSH: TSH: 4.207 u[IU]/mL (ref 0.350–4.500)

## 2017-04-11 MED ORDER — ENOXAPARIN SODIUM 120 MG/0.8ML ~~LOC~~ SOLN: 120.0000 mg | Freq: Two times a day (BID) | SUBCUTANEOUS | 0 refills | Status: DC

## 2017-04-11 NOTE — Discharge Summary (Signed)
Physician Discharge Summary  Joshua [name] WUX:[id number] DOB: 04-11-1951 DOA: 04/09/2017  PCP: [name], MD  Admit date: 04/09/2017 Discharge date: 04/11/2017  Admitted From: Home  Disposition: Home   Recommendations for Outpatient Follow-up:  1. Follow up with PCP in 3-5 days for recheck 2. Please obtain BMP/CBC in one week  Home Health: YES PT  Discharge Condition: STABLE  CODE STATUS: FULL   Brief/Interim Summary:  HPI: Joshua [name] is a 66 y.o. male with medical history significant of OSA on CPAP; obesity; HTN; HLD; DM; and CVA with recent left TKR presenting with SOB and light-headedness.  Patient is s/p TKR on 5/7.  He was discharged on 81 mg ASA BID.  He had PT on Thursday nad walked that day without difficulty.  He was doing well with rehab.  He went for his first hard PT on Monday and he rode the bike for about 10 minutes.  When he stood up afterwards, he felt extremely dizzy and nauseated.  BP was very low.  It happened again Tuesday AM even without PT - got up and felt very drowsy, clammy, felt like he would throw up.  Went for PT Wednesday and the same thing happened again.  They called Dr. Virgina Jock and saw him today.  He suggested that he was dehydrated and there might be a PE and so sent him to the ER.  He does take short, shallow breaths during episodes but otherwise not SOB - this started Monday at PT.  He did appear to be SOB with transfers and any movement according to wife as of Monday.    ED Course: Chest CT positive for PE, started on heparin drip  PE -Patient with recent L TKR nowpresenting with new DVT/PE -PE seen on CT;  -he is started on Heparin drip since admission, after discussion, patient prefer to transition to subQ lovenox -echo/venous doppler order to complete the work up - see results below.  - Pt says he will have his lovenox by mail on Monday, needs supply until then, asked care manager to assist patient with lovenox injections for 3 days so  that he will have supply to cover until when the mail order supply arrives on Monday.    Echo: Study Conclusions  - Left ventricle: The cavity size was normal. There was moderate  focal basal hypertrophy of the septum. Systolic function was   vigorous. The estimated ejection fraction was in the range of 65%  to 70%. Wall motion was normal; there were no regional wall   motion abnormalities. Doppler parameters are consistent with abnormal left ventricular relaxation (grade 1 diastolic dysfunction).  [place] LE Venous : Summary: There is evidence of acute deep vein thrombosis involving the posterior tibial and peroneal veins of the left lower extremity.  There is no evidence of superficial vein thrombosis involving the left lower extremity. There is no evidence of deep or superficial vein thrombosis involving the right lower extremity. There is no evidence of a Baker&'s cyst bilaterally.  S/p L TKR -Patient was doing well with his rehab prior to this complication -Will need ongoing therapy once his clot is stabilized -Continue Norco and Robaxin for pain - He will have [place] PT at discharge  Insulin dependent DM2 -Glucose 128 -Hold Amaryl, Trulicity -Continue Toujeo -Cover with SSI - follow up with diabetologist, did not recommend restarting amaryl at discharge  OSA -Continue CPAP  HTN -Continue Cozaar, hold hctz due to mild orthostatic hypotension  DVT prophylaxis:enoxaparin Code Status:Full -  confirmed with patient/family Family Communication:Wife present throughout evaluation Disposition Plan:Home with home health Consults called:None  Discharge Diagnoses:  Principal Problem:   PE (pulmonary thromboembolism) ([place]) Active Problems:   Diabetes mellitus type 2 in obese ([place])   S/P left TKA   OSA on CPAP   Essential hypertension   Pulmonary thromboembolism [facility])  Discharge Instructions  Discharge Instructions    Increase activity slowly    Complete by:  As  directed      Allergies as of 04/11/2017   No Known Allergies     Medication List    STOP taking these medications   celecoxib 200 MG capsule Commonly known as:  CELEBREX   glimepiride 4 MG tablet Commonly known as:  AMARYL     TAKE these medications   aspirin 81 MG chewable tablet Chew 1 tablet (81 mg total) by mouth daily. What changed:  when to take this  additional instructions   atorvastatin 40 MG tablet Commonly known as:  LIPITOR Take 40 mg by mouth at bedtime.   docusate sodium 100 MG capsule Commonly known as:  COLACE Take 1 capsule (100 mg total) by mouth 2 (two) times daily.   enoxaparin 120 MG/0.8ML injection Commonly known as:  LOVENOX Inject 0.8 mLs (120 mg total) into the skin every 12 (twelve) hours.   escitalopram 20 MG tablet Commonly known as:  LEXAPRO Take 20 mg by mouth at bedtime.   fenofibrate 54 MG tablet Take 54 mg by mouth daily.   ferrous sulfate 325 (65 FE) MG tablet Commonly known as:  FERROUSUL Take 1 tablet (325 mg total) by mouth daily with breakfast. What changed:  when to take this   Fish Oil 1000 MG Caps Take 2,000 mg by mouth daily. Reported on 02/13/2016   GLUCOPHAGE 1000 MG tablet Generic drug:  metFORMIN Take 1,000 mg by mouth 2 (two) times daily with a meal.   hydrochlorothiazide 25 MG tablet Commonly known as:  HYDRODIURIL Take 25 mg by mouth daily.   HYDROcodone-acetaminophen 7.5-325 MG tablet Commonly known as:  NORCO Take 1-2 tablets by mouth every 4 (four) hours as needed for moderate pain or severe pain.   losartan 100 MG tablet Commonly known as:  COZAAR Take 100 mg by mouth daily.   Lutein-Zeaxanthin 25-5 MG Caps Take 1 tablet by mouth daily.   methocarbamol 500 MG tablet Commonly known as:  ROBAXIN Take 1 tablet (500 mg total) by mouth every 6 (six) hours as needed for muscle spasms.   MULTIVITAMIN PO Take 1 tablet by mouth daily.   polyethylene glycol packet Commonly known as:  MIRALAX /  GLYCOLAX Take 17 g by mouth 2 (two) times daily.   primidone 50 MG tablet Commonly known as:  MYSOLINE Take 1/2 tablet PO at bedtime for 1 week, if tolerating increase to 1/2 tablet BID   SYSTANE OP Place 1-2 drops into both eyes 2 (two) times daily as needed (dry eyes).   TOUJEO SOLOSTAR 300 UNIT/ML Sopn Generic drug:  Insulin Glargine Inject 70 Units into the skin 2 (two) times daily. Breakfast and supper   TRULICITY 1.5 FA/2.1HY Sopn Generic drug:  Dulaglutide Inject 1.5 mg into the skin once a week. Friday      Follow-up Information    [name], MD. Schedule an appointment as soon as possible for a visit in 4 day(s).   Specialty:  Internal Medicine Why:  hospital discharge follow up, repeat cbc/bmp pmd to continue monitor lovenox use for PE treatment Contact  information: [place] [address] [phone]        Paralee Cancel, MD. Schedule an appointment as soon as possible for a visit in 1 week(s).   Specialty:  Orthopedic Surgery Contact information: [address] [place] [address] [phone]          No Known Allergies   Procedures/Studies: Ct Angio Chest Pe W Or Wo Contrast  Result Date: 04/09/2017 CLINICAL DATA:  Dyspnea status post left knee replacement 1 week ago. EXAM: CT ANGIOGRAPHY CHEST WITH CONTRAST TECHNIQUE: Multidetector CT imaging of the chest was performed using the standard protocol during bolus administration of intravenous contrast. Multiplanar CT image reconstructions and MIPs were obtained to evaluate the vascular anatomy. CONTRAST:  100 cc Isovue 370 IV COMPARISON:  None. FINDINGS: Cardiovascular: Acute nonocclusive thrombus noted to the right middle lobe within segmental branch, series 9, image 147 and coronal series 11, image 73. RV/LV ratio equals 0.93 consistent with right heart strain. Normal heart size without pericardial effusion. Coronary arteriosclerosis along the LAD. There is aortic  atherosclerosis without aortic aneurysm. Mediastinum/Nodes: No enlarged mediastinal, hilar, or axillary lymph nodes. Thyroid gland, trachea, and esophagus demonstrate no significant findings. Lungs/Pleura: Lungs are free of pulmonary infarcts and pneumonic consolidations. Bibasilar atelectasis. No dominant mass. No pleural effusion or pneumothorax. Upper Abdomen: No acute abnormality. Musculoskeletal: No chest wall abnormality. Diffuse idiopathic skeletal hyperostosis of the dorsal spine. No acute or significant osseous findings. Review of the MIP images confirms the above findings. IMPRESSION: Positive for acute PE with CT evidence of right heart strain (RV/LV Ratio = 0.93) consistent with at least submassive (intermediate risk) PE. The presence of right heart strain has been associated with an increased risk of morbidity and mortality. Please activate Code PE by paging [phone]. Critical Value/emergent results were called by telephone at the time of interpretation on 04/09/2017 at 7:28 pm to Dr. Lacretia Leigh , who verbally acknowledged these results. Coronary arteriosclerosis and aortic atherosclerosis. Diffuse idiopathic skeletal hyperostosis of the dorsal spine. Electronically Signed   By: Ashley Royalty M.D.   On: 04/09/2017 19:28    (Echo, Carotid, EGD, Colonoscopy, ERCP)    Subjective: Pt without complaints today.  No CP or SOB. Ready to go home.   Discharge Exam: Vitals:   04/10/17 2130 04/11/17 0657  BP: (!) 152/89 (!) 141/83  Pulse: 79 81  Resp: 18 18  Temp: 99.5 F (37.5 C) 97.8 F (36.6 C)   Vitals:   04/10/17 0433 04/10/17 1436 04/10/17 2130 04/11/17 0657  BP: (!) 156/83  (!) 152/89 (!) 141/83  Pulse: 89 89 79 81  Resp:  20 18 18   Temp: 98.4 F (36.9 C) 98.5 F (36.9 C) 99.5 F (37.5 C) 97.8 F (36.6 C)  TempSrc: Oral Oral Oral Oral  SpO2: 96%  100% 95%  Weight:      Height:       General: Pt is alert, awake, not in acute distress Cardiovascular: RRR, S1/S2 +, no  rubs, no gallops Respiratory: CTA bilaterally, no wheezing, no rhonchi Abdominal: Soft, NT, ND, bowel sounds + Extremities: no edema, no cyanosis  The results of significant diagnostics from this hospitalization (including imaging, microbiology, ancillary and laboratory) are listed below for reference.     Microbiology: No results found for this or any previous visit (from the past 240 hour(s)).   Labs: BNP (last 3 results)  Recent Labs  04/09/17 2125  BNP 51.7   Basic Metabolic Panel:  Recent Labs Lab 04/10/17  0310 04/11/17 0607  NA 138 137  K 4.1 4.3  CL 105 101  CO2 25 26  GLUCOSE 92 110*  BUN 28* 23*  CREATININE 1.32* 1.35*  CALCIUM 9.0 9.2   Liver Function Tests: No results for input(s): AST, ALT, ALKPHOS, BILITOT, PROT, ALBUMIN in the last 168 hours. No results for input(s): LIPASE, AMYLASE in the last 168 hours. No results for input(s): AMMONIA in the last 168 hours. CBC:  Recent Labs Lab 04/09/17 2125 04/10/17 0310 04/11/17 0607  WBC 10.5 9.8 9.2  HGB 10.9* 10.3* 10.9*  HCT 33.0* 31.5* 33.3*  MCV 88.7 89.0 88.3  PLT 280 292 294   Cardiac Enzymes:  Recent Labs Lab 04/09/17 2125  TROPONINI <0.03   BNP: Invalid input(s): POCBNP CBG:  Recent Labs Lab 04/10/17 1200 04/10/17 1659 04/10/17 2148 04/11/17 0730 04/11/17 1127  GLUCAP 101* 100* 100* 94 147*   D-Dimer No results for input(s): DDIMER in the last 72 hours. Hgb A1c No results for input(s): HGBA1C in the last 72 hours. Lipid Profile No results for input(s): CHOL, HDL, LDLCALC, TRIG, CHOLHDL, LDLDIRECT in the last 72 hours. Thyroid function studies  Recent Labs  04/11/17 0607  TSH 4.207   Anemia work up No results for input(s): VITAMINB12, FOLATE, FERRITIN, TIBC, IRON, RETICCTPCT in the last 72 hours. Urinalysis    Component Value Date/Time   COLORURINE YELLOW 04/10/2017 1147   APPEARANCEUR CLEAR 04/10/2017 1147   LABSPEC 1.014 04/10/2017 1147   PHURINE 7.0 04/10/2017  1147   GLUCOSEU NEGATIVE 04/10/2017 1147   HGBUR NEGATIVE 04/10/2017 [place] 04/10/2017 1147   [place] 04/10/2017 1147   PROTEINUR 30 (A) 04/10/2017 1147   UROBILINOGEN 0.2 04/08/2011 0732   NITRITE NEGATIVE 04/10/2017 1147   LEUKOCYTESUR NEGATIVE 04/10/2017 1147   Sepsis Labs Invalid input(s): PROCALCITONIN,  WBC,  LACTICIDVEN Microbiology No results found for this or any previous visit (from the past 240 hour(s)).   Time coordinating discharge: 33 minutes  SIGNED:  Irwin Brakeman, MD  [facility] 04/11/2017, 1:44 PM Pager [phone]  If 7PM-7AM, please contact night-coverage [email or web address] Password [email or web address]

## 2017-04-11 NOTE — Care Management Note (Addendum)
Case Management Note  Patient Details  Name: Joshua [name] MRN: [id number] Date of Birth: 1951-09-26  Subjective/Objective:     Recent L TKR, DVT/PE               Action/Plan: Discharge Planning: NCM spoke to pt and wife at bedside. Pt states he goes to [place] and is requesting [place] be set up through [place]. Explained to pt that there may be a delay in [facility] Pt agreeable to wait. States she had outpt PT at [place] and familiar with exercises to do at home. Will pick up is Lovenox at [facility] on [address]. Will fax dc summary and [place] orders to [place]. Pt states he sees Dr. Henrene Pastor at the [facility]. Has RW and bedside commode.    PCP [name]   Expected Discharge Date:  04/11/17               Expected Discharge Plan:  [place]  In-House Referral:  NA  Discharge planning Services  CM Consult  Post Acute Care Choice:  Home Health Choice offered to:  Patient  DME Arranged:  N/A DME Agency:  NA  HH Arranged:  PT, RN [place] Agency:  Other - See comment  Status of Service:  Completed, signed off  If discussed at [place] of Stay Meetings, dates discussed:    Additional Comments:  Erenest Rasher, RN 04/11/2017, 3:43 PM

## 2017-04-11 NOTE — Progress Notes (Signed)
Patient self administered cpap. Patient is tolerating well.

## 2017-04-11 NOTE — Progress Notes (Signed)
Physical Therapy Treatment Patient Details Name: Joshua [name] MRN: [id number] DOB: Jan 13, 1951 Today's Date: 04/11/2017    History of Present Illness  Joshua [name] is a 66 y.o. male with medical history significant of OSA on CPAP; obesity; HTN; HLD; DM; and CVA with recent left TKR presenting with SOB and light-headedness.  Patient is s/p TKR on 5/7, now dx with PE.  acute deep vein thrombosis involving the posterior tibial and peroneal veins of the left lower extremity    PT Comments    The patient is very motivated to mobilize. Recommend HHPT after this DC. The left knee remains weak and  Slightly buckles at times.   Follow Up Recommendations  Home health PT;Supervision/Assistance - 24 hour (the patient and wife report OPPT has been a hardship as well as patient now has new DX of PE and DVT)     Equipment Recommendations  None recommended by PT    Recommendations for Other Services       Precautions / Restrictions Precautions Precautions: Fall;Knee Restrictions Other Position/Activity Restrictions: WBAT    Mobility  Bed Mobility Overal bed mobility: Modified Independent             General bed mobility comments: incr time  Transfers Overall transfer level: Needs assistance Equipment used: Rolling walker (2 wheeled) Transfers: Sit to/from Stand Sit to Stand: Min guard;From elevated surface         General transfer comment: cues for hand placement  Ambulation/Gait Ambulation/Gait assistance: Supervision;Min guard Ambulation Distance (Feet): 150 Feet Assistive device: Rolling walker (2 wheeled) Gait Pattern/deviations: Step-to pattern;Decreased step length - left;Decreased stance time - left     General Gait Details: cues for knee flexion during gait; incr WOB with O2 sats 94% , HR 80, BP143/81. The left knee knee buckles and  hyperextends during ambulation   Stairs            Wheelchair Mobility    Modified Rankin (Stroke Patients Only)        Balance                                            Cognition Arousal/Alertness: Awake/alert                                            Exercises      General Comments        Pertinent Vitals/Pain Pain Score: 3  Pain Location: left knee Pain Descriptors / Indicators: Operative site guarding;Sore;Tender Pain Intervention(s): Monitored during session    Home Living                      Prior Function            PT Goals (current goals can now be found in the care plan section) Progress towards PT goals: Progressing toward goals    Frequency    7X/week      PT Plan Discharge plan needs to be updated    Co-evaluation              AM-PAC PT "6 Clicks" Daily Activity  Outcome Measure  Difficulty turning over in bed (including adjusting bedclothes, sheets and blankets)?: None Difficulty moving from lying on back to sitting on  the side of the bed? : A Little Difficulty sitting down on and standing up from a chair with arms (e.g., wheelchair, bedside commode, etc,.)?: A Little Help needed moving to and from a bed to chair (including a wheelchair)?: A Little Help needed walking in hospital room?: A Little Help needed climbing 3-5 steps with a railing? : A Little 6 Click Score: 19    End of Session Equipment Utilized During Treatment: Gait belt Activity Tolerance: Patient tolerated treatment well Patient left: in chair;with call bell/phone within reach;with family/visitor present Nurse Communication: Mobility status PT Visit Diagnosis: Difficulty in walking, not elsewhere classified (R26.2)     Time: 4315-4008 PT Time Calculation (min) (ACUTE ONLY): 15 min  Charges:  $Gait Training: 8-22 mins                    G CodesTresa Endo PT [phone]    Claretha Cooper 04/11/2017, 1:32 PM

## 2017-04-12 LAB — HEMOGLOBIN A1C
Hgb A1c MFr Bld: 7.5 % — ABNORMAL HIGH (ref 4.8–5.6)
MEAN PLASMA GLUCOSE: 169 mg/dL

## 2017-04-13 NOTE — Progress Notes (Addendum)
Contacted [place] and Dr Blanch Media fax [phone]. [place] [phone] fax [phone]. Faxed orders for [facility] and dc summary. Jonnie Finner RN CCM Case Mgmt phone [phone]   04/13/2017 1500 Received call from [facility] and approved [facility] set up with [facility]. Contacted [facility] [employer] with new referral. Jonnie Finner RN CCM Case Mgmt phone [phone]

## 2017-04-14 DIAGNOSIS — Z7901 Long term (current) use of anticoagulants: Secondary | ICD-10-CM | POA: Insufficient documentation

## 2017-04-15 DIAGNOSIS — I700 Atherosclerosis of aorta: Secondary | ICD-10-CM | POA: Insufficient documentation

## 2017-04-16 ENCOUNTER — Ambulatory Visit (INDEPENDENT_AMBULATORY_CARE_PROVIDER_SITE_OTHER): Payer: Medicare Other | Admitting: Podiatry

## 2017-04-16 ENCOUNTER — Encounter: Payer: Self-pay | Admitting: Podiatry

## 2017-04-16 VITALS — Temp 97.6°F

## 2017-04-16 DIAGNOSIS — E11621 Type 2 diabetes mellitus with foot ulcer: Secondary | ICD-10-CM | POA: Diagnosis not present

## 2017-04-16 DIAGNOSIS — L97521 Non-pressure chronic ulcer of other part of left foot limited to breakdown of skin: Secondary | ICD-10-CM | POA: Diagnosis not present

## 2017-04-16 DIAGNOSIS — L97529 Non-pressure chronic ulcer of other part of left foot with unspecified severity: Secondary | ICD-10-CM | POA: Diagnosis not present

## 2017-04-17 NOTE — Progress Notes (Signed)
Subjective: 66 year old male presents the office today for follow-up evaluation of left foot ulceration. Since last appointment he has undergone left knee surgery which unfortunately resulted in a pulmonary embolism. Because of this he has not been on his feet much he states the left foot is doing better. He has not noticed any swelling or redness or any drainage. Denies any systemic complaints such as fevers, chills, nausea, vomiting. No acute changes since last appointment, and no other complaints at this time.   Objective: AAO x3, NAD DP/PT pulses palpable bilaterally, CRT less than 3 seconds On the plantar aspect the left foot submetatarsal 4 hyperkeratotic lesion. After debridement. There is no underlying ulceration or drainage or any clinical signs of infection. This appears to be healed again today without any problems. There is no other open lesions or pre-ulcerative lesions.  No pain with calf compression, swelling, warmth, erythema  Assessment: Recurrent ulceration left foot which is healed  Plan: -All treatment options discussed with the patient including all alternatives, risks, complications.  -Hyperkeratotic lesion was sharply debrided without complications. Continue offloading pads which were dispensed today. Moisturizer to the area. We are awaiting his diabetic shoe and insert which should arrive next couple of days. We will see him later next week for this. In the meantime I encouraged him to call any questions or concerns or any change in symptoms.  Celesta Gentile, DPM

## 2017-04-21 ENCOUNTER — Telehealth: Payer: Self-pay | Admitting: *Deleted

## 2017-04-21 NOTE — Telephone Encounter (Signed)
Left message on voicemail to call office to schedule an appointment to pick up diabetic shoes.

## 2017-04-28 ENCOUNTER — Ambulatory Visit (INDEPENDENT_AMBULATORY_CARE_PROVIDER_SITE_OTHER): Payer: Medicare Other | Admitting: Podiatry

## 2017-04-28 DIAGNOSIS — L97521 Non-pressure chronic ulcer of other part of left foot limited to breakdown of skin: Secondary | ICD-10-CM | POA: Diagnosis not present

## 2017-04-28 DIAGNOSIS — E1142 Type 2 diabetes mellitus with diabetic polyneuropathy: Secondary | ICD-10-CM

## 2017-04-28 DIAGNOSIS — L97511 Non-pressure chronic ulcer of other part of right foot limited to breakdown of skin: Secondary | ICD-10-CM

## 2017-04-28 DIAGNOSIS — L97529 Non-pressure chronic ulcer of other part of left foot with unspecified severity: Secondary | ICD-10-CM

## 2017-04-28 DIAGNOSIS — E11621 Type 2 diabetes mellitus with foot ulcer: Secondary | ICD-10-CM

## 2017-04-28 NOTE — Progress Notes (Addendum)
Patient came in today to pick up diabetic shoes and custom inserts.  Same was well pleased with fit and function.   The foot ortheses offered full contact with plantar surface and contoured the arch well.   The shoes fit well with no heel slippage and areas of pressure concern.   Patient advised to contact us if any problems arise.  Patient presents the office today to pick up his diabetic shoes.  Diagnosis diabetic ulcer  . Diabetic neuropathy  Dispense diabetic shoes per Liliane Channel.  RTC prn   Gardiner Barefoot DPM

## 2017-06-19 DIAGNOSIS — R5383 Other fatigue: Secondary | ICD-10-CM | POA: Insufficient documentation

## 2017-06-22 ENCOUNTER — Encounter: Payer: Self-pay | Admitting: Neurology

## 2017-06-22 ENCOUNTER — Ambulatory Visit (INDEPENDENT_AMBULATORY_CARE_PROVIDER_SITE_OTHER): Payer: Medicare Other | Admitting: Neurology

## 2017-06-22 DIAGNOSIS — G250 Essential tremor: Secondary | ICD-10-CM

## 2017-06-22 HISTORY — DX: Essential tremor: G25.0

## 2017-06-22 MED ORDER — PRIMIDONE 50 MG PO TABS: 50.0000 mg | ORAL_TABLET | Freq: Two times a day (BID) | ORAL | 5 refills | Status: DC

## 2017-06-22 NOTE — Patient Instructions (Signed)
   We will go up on the Mysoline to 50 mg twice a day.  We will make a referral to  Dr. Carles Collet for evaluation for deep brain stimulation.

## 2017-06-22 NOTE — Progress Notes (Signed)
Reason for visit: Essential tremor  Joshua [name] is an 66 y.o. male  History of present illness:  Joshua [name] is a 66 year old right-handed white male with a history of an essential tremor that has gradually worsened over time. The patient is on Mysoline in low dose taking 50 mg tablet, one half tablet twice daily. The patient is tolerating the medication but he is not getting much benefit with the tremor itself. The patient recently had a left total knee replacement, he is still getting over the surgery. The patient required admission to the hospital on 04/09/2017 several weeks after the knee surgery for shortness of breath associated with a pulmonary embolism. The patient has recovered from this. The patient needs to have significant tremor with feeding himself and with handwriting. He works part-time as a Pharmacist, hospital. He does have a history of vertigo, he may have some slight gait instability associated with this. He returns to this office for an evaluation.  Past Medical History:  Diagnosis Date  . Benign essential tremor   . Benign positional vertigo   . CVA (cerebral vascular accident) ([place])    x2 - L retina, 1 right parietal  . Degenerative arthritis   . Diabetes mellitus   . Dyslipidemia   . GERD (gastroesophageal reflux disease)    hiatal hernia  . Gout   . H/O: vasectomy   . Hx of appendectomy   . Hx of tonsillectomy   . Hypertension   . 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 Melanoma ([place])   . Obesity   . OSA on CPAP    setting = 5  . Tremor, essential 06/22/2017    Past Surgical History:  Procedure Laterality Date  . APPENDECTOMY    . arthroscopic knee surgery Bilateral   . CATARACT EXTRACTION Bilateral   . NASAL SEPTUM SURGERY    . TONSILLECTOMY    . TOTAL KNEE ARTHROPLASTY Left 03/30/2017   Procedure: LEFT TOTAL KNEE ARTHROPLASTY;  Surgeon: Paralee Cancel, MD;  Location: [facility];  Service: Orthopedics;  Laterality: Left;    Family History  Problem  Relation Age of Onset  . Cerebral aneurysm Mother   . Tremor Mother   . Alzheimer's disease Father   . Tremor Brother   . Tremor Maternal Uncle   . Diabetes Maternal Grandmother   . Colon cancer Neg Hx     Social history:  reports that he quit smoking about 33 years ago. His smoking use included Cigarettes. He has a 10.00 pack-year smoking history. He has never used smokeless tobacco. He reports that he drinks about 1.2 oz of alcohol per week . He reports that he does not use drugs.   No Known Allergies  Medications:  Prior to Admission medications   Medication Sig Start Date End Date Taking? Authorizing Provider  atorvastatin (LIPITOR) 40 MG tablet Take 40 mg by mouth at bedtime.    Yes [provider]  docusate sodium (COLACE) 100 MG capsule Take 1 capsule (100 mg total) by mouth 2 (two) times daily. 03/30/17  Yes Babish, Rodman Key, PA-C  escitalopram (LEXAPRO) 20 MG tablet Take 20 mg by mouth at bedtime.   Yes [provider]  fenofibrate 54 MG tablet Take 54 mg by mouth daily.   Yes [provider]  ferrous sulfate (FERROUSUL) 325 (65 FE) MG tablet Take 1 tablet (325 mg total) by mouth daily with breakfast. 04/10/17  Yes Florencia Reasons, MD  hydrochlorothiazide (HYDRODIURIL) 25 MG  tablet Take 25 mg by mouth daily.   Yes [provider]  HYDROcodone-acetaminophen (NORCO) 7.5-325 MG tablet Take 1-2 tablets by mouth every 4 (four) hours as needed for moderate pain or severe pain. 03/30/17  Yes Babish, Rodman Key, PA-C  losartan (COZAAR) 100 MG tablet Take 100 mg by mouth daily.   Yes [provider]  Lutein-Zeaxanthin 25-5 MG CAPS Take 1 tablet by mouth daily.   Yes [provider]  metFORMIN (GLUCOPHAGE) 1000 MG tablet Take 1,000 mg by mouth 2 (two) times daily with a meal.   Yes [provider]  methocarbamol (ROBAXIN) 500 MG tablet Take 1 tablet (500 mg total) by mouth every 6 (six) hours as needed for muscle spasms. 03/30/17  Yes Babish,  Rodman Key, PA-C  Multiple Vitamins-Minerals (MULTIVITAMIN PO) Take 1 tablet by mouth daily.    Yes [provider]  Omega-3 Fatty Acids (FISH OIL) 1000 MG CAPS Take 2,000 mg by mouth daily. Reported on 02/13/2016   Yes [provider]  Polyethyl Glycol-Propyl Glycol (SYSTANE OP) Place 1-2 drops into both eyes 2 (two) times daily as needed (dry eyes).   Yes [provider]  polyethylene glycol (MIRALAX / GLYCOLAX) packet Take 17 g by mouth 2 (two) times daily. 03/30/17  Yes Babish, Rodman Key, PA-C  primidone (MYSOLINE) 50 MG tablet Take 1/2 tablet PO at bedtime for 1 week, if tolerating increase to 1/2 tablet BID 12/22/16  Yes Millikan, Megan, NP  TOUJEO SOLOSTAR 300 UNIT/ML SOPN Inject 70 Units into the skin 2 (two) times daily. Breakfast and supper 07/31/15  Yes [provider]  TRULICITY 1.5 VO/5.3GU SOPN Inject 1.5 mg into the skin once a week. Friday 08/02/16  Yes [provider]  enoxaparin (LOVENOX) 120 MG/0.8ML injection Inject 0.8 mLs (120 mg total) into the skin every 12 (twelve) hours. 04/11/17 04/14/17  Murlean Iba, MD    ROS:  Out of a complete 14 system review of symptoms, the patient complains only of the following symptoms, and all other reviewed systems are negative.  Fatigue Hearing loss Sleep apnea, daytime sleepiness Tremors  Blood pressure (!) 151/86, pulse 83, height 6\' 4"  (1.93 m), weight 268 lb 8 oz (121.8 kg).  Physical Exam  General: The patient is alert and cooperative at the time of the examination. The patient is moderately obese.  Neuromuscular: The left knee is slightly swollen, warm.  Skin: No significant peripheral edema is noted.   Neurologic Exam  Mental status: The patient is alert and oriented x 3 at the time of the examination. The patient has apparent normal recent and remote memory, with an apparently normal attention span and concentration ability.   Cranial nerves: Facial symmetry is present. Speech is  normal, no aphasia or dysarthria is noted. Extraocular movements are full. Visual fields are full.  Motor: The patient has good strength in all 4 extremities.  Sensory examination: Soft touch sensation is symmetric on the face, arms, and legs.  Coordination: The patient has good finger-nose-finger and heel-to-shin bilaterally. The patient has tremors associated with finger-nose-finger bilaterally.  Gait and station: The patient has a limping type gait on the left leg. Tandem gait is slightly unsteady. Romberg is negative. No drift is seen.  Reflexes: Deep tendon reflexes are symmetric.   Assessment/Plan:  1. Essential tremor  The patient would like to gain some further information regarding the possibility of having a deep brain stimulator placed. I will set up a referral to Dr. Carles Collet for this purpose. The patient  will go up on his Mysoline taking 50 mg twice daily. He will call for any dose adjustments.  Jill Alexanders MD 06/22/2017 8:17 AM  [facility] [address] [place], [place] [address]  Phone [phone] Fax [phone]

## 2017-07-08 ENCOUNTER — Other Ambulatory Visit (HOSPITAL_COMMUNITY): Payer: Self-pay | Admitting: Internal Medicine

## 2017-07-08 DIAGNOSIS — Z09 Encounter for follow-up examination after completed treatment for conditions other than malignant neoplasm: Secondary | ICD-10-CM

## 2017-07-08 DIAGNOSIS — Z86718 Personal history of other venous thrombosis and embolism: Principal | ICD-10-CM

## 2017-07-09 ENCOUNTER — Ambulatory Visit (HOSPITAL_COMMUNITY)
Admission: RE | Admit: 2017-07-09 | Discharge: 2017-07-09 | Disposition: A | Discharge status: Home / Self Care | Payer: Medicare Other | Source: Ambulatory Visit | Attending: Vascular Surgery | Admitting: Vascular Surgery

## 2017-07-09 DIAGNOSIS — Z86718 Personal history of other venous thrombosis and embolism: Secondary | ICD-10-CM | POA: Diagnosis not present

## 2017-07-09 DIAGNOSIS — I2699 Other pulmonary embolism without acute cor pulmonale: Secondary | ICD-10-CM | POA: Insufficient documentation

## 2017-07-09 DIAGNOSIS — Z7901 Long term (current) use of anticoagulants: Secondary | ICD-10-CM | POA: Diagnosis present

## 2017-07-09 DIAGNOSIS — Z09 Encounter for follow-up examination after completed treatment for conditions other than malignant neoplasm: Secondary | ICD-10-CM

## 2017-07-16 ENCOUNTER — Ambulatory Visit: Payer: Medicare Other | Admitting: Neurology

## 2017-07-21 NOTE — Progress Notes (Signed)
Subjective:   Joshua [name] was seen in consultation in the movement disorder clinic at the request of Kathrynn Ducking, MD.  His PCP is [name], MD.  This patient is accompanied in the office by his wife who supplements the history.  The evaluation is for possible DBS for tremor.  The records that were made available to me were reviewed.  Tremor started approximately27 years ago.  It has gotten worse over the last 4 years  and involves the bilateral UE.  The R hand is worse than the L.  The mornings are worse than later in the day.    Tremor is most noticeable when he is going to hold something and when trying to approximate an object.   There is a family hx of tremor in his mother/brother/maternal uncle.  Pt started on primidone in about 2014, took it for about a year and was not sure that it was helpful.  He restarted it in Jan. 2018 at 25 mg bid.  He has tried weighted pens, which were somewhat helpful, but weighted utensils were not.  At the end of July, 2018, his primidone was increased to 50 mg bid.  He expressed interest in getting information on DBS therapy.  Affected by caffeine:  No. (drinks 1 cup coffee per day; drinks 1-2 large glasses tea per day) Affected by alcohol:  No. (none since May; prior 1-2 per week) Affected by stress:  No. Affected by fatigue:  No. Spills soup if on spoon:  Yes.   Spills glass of liquid if full:  Yes.   Affects ADL's (tying shoes, brushing teeth, etc):  Yes.   (shaves with blade with 2 hands)  Current/Previously tried tremor medications: primidone  Current medications that may exacerbate tremor:  n/a  Outside reports reviewed: historical medical records, lab reports and referral letter/letters.  The patient did have a left total knee replacement on May 7.  About a week later he ended up back in the hospital with DVT/PE.  He is on Lovenox for that.  No Known Allergies  Outpatient Encounter Prescriptions as of 07/23/2017  Medication Sig  .  atorvastatin (LIPITOR) 40 MG tablet Take 40 mg by mouth at bedtime.   . docusate sodium (COLACE) 100 MG capsule Take 1 capsule (100 mg total) by mouth 2 (two) times daily.  Marland Kitchen escitalopram (LEXAPRO) 20 MG tablet Take 20 mg by mouth at bedtime.  . fenofibrate 54 MG tablet Take 54 mg by mouth daily.  . hydrochlorothiazide (HYDRODIURIL) 25 MG tablet Take 25 mg by mouth daily.  Marland Kitchen losartan (COZAAR) 100 MG tablet Take 100 mg by mouth daily.  . Lutein-Zeaxanthin 25-5 MG CAPS Take 1 tablet by mouth daily.  . metFORMIN (GLUCOPHAGE) 1000 MG tablet Take 1,000 mg by mouth 2 (two) times daily with a meal.  . methocarbamol (ROBAXIN) 500 MG tablet Take 1 tablet (500 mg total) by mouth every 6 (six) hours as needed for muscle spasms.  . Multiple Vitamins-Minerals (MULTIVITAMIN PO) Take 1 tablet by mouth daily.   . Omega-3 Fatty Acids (FISH OIL) 1000 MG CAPS Take 2,000 mg by mouth daily. Reported on 02/13/2016  . Polyethyl Glycol-Propyl Glycol (SYSTANE OP) Place 1-2 drops into both eyes 2 (two) times daily as needed (dry eyes).  . polyethylene glycol (MIRALAX / GLYCOLAX) packet Take 17 g by mouth 2 (two) times daily.  . primidone (MYSOLINE) 50 MG tablet Take 1 tablet (50 mg total) by mouth 2 (two) times daily.  Marland Kitchen  TOUJEO SOLOSTAR 300 UNIT/ML SOPN Inject 70 Units into the skin 2 (two) times daily. Breakfast and supper  . TRULICITY 1.5 WJ/1.9JY SOPN Inject 1.5 mg into the skin once a week. Friday  . [DISCONTINUED] ferrous sulfate (FERROUSUL) 325 (65 FE) MG tablet Take 1 tablet (325 mg total) by mouth daily with breakfast.  . [DISCONTINUED] HYDROcodone-acetaminophen (NORCO) 7.5-325 MG tablet Take 1-2 tablets by mouth every 4 (four) hours as needed for moderate pain or severe pain.  Marland Kitchen enoxaparin (LOVENOX) 120 MG/0.8ML injection Inject 0.8 mLs (120 mg total) into the skin every 12 (twelve) hours.   No facility-administered encounter medications on file as of 07/23/2017.     Past Medical History:  Diagnosis Date  .  Benign essential tremor   . Benign positional vertigo   . CVA (cerebral vascular accident) ([place])    x2 - L retina, 1 right parietal  . Degenerative arthritis   . Diabetes mellitus   . Dyslipidemia   . GERD (gastroesophageal reflux disease)    hiatal hernia  . Gout   . H/O: vasectomy   . Hx of appendectomy   . Hx of tonsillectomy   . Hypertension   . 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 Melanoma ([place])   . Obesity   . OSA on CPAP    setting = 5  . Tremor, essential 06/22/2017    Past Surgical History:  Procedure Laterality Date  . APPENDECTOMY    . arthroscopic knee surgery Bilateral   . CATARACT EXTRACTION Bilateral   . NASAL SEPTUM SURGERY    . TONSILLECTOMY    . TOTAL KNEE ARTHROPLASTY Left 03/30/2017   Procedure: LEFT TOTAL KNEE ARTHROPLASTY;  Surgeon: Paralee Cancel, MD;  Location: [facility];  Service: Orthopedics;  Laterality: Left;    Social History   Social History  . Marital status: Married    Spouse name: [name]  . Number of children: 2  . Years of education: Bachelors    Occupational History  . Substitue teacher [employer]   Social History Main Topics  . Smoking status: Former Smoker    Packs/day: 1.00    Years: 10.00    Types: Cigarettes    Quit date: 11/25/1983  . Smokeless tobacco: Never Used  . Alcohol use 1.2 oz/week    2 Standard drinks or equivalent per week     Comment: occassionally  . Drug use: No  . Sexual activity: Not on file   Other Topics Concern  . Not on file   Social History Narrative   Patient lives at home with wife. [name](   Patient has 2 children that are in good health.    Patient works for [employer]. Retired .   Patient has a Bachelors degree in History.     Family Status  Relation Status  . Mother Deceased at age 67       Cerebral aneurysm  . Father Deceased at age 81       Alzheimer's disease  . Brother Alive  . Brother (Not Specified)  . Mat Uncle (Not Specified)  . MGM (Not  Specified)  . Neg Hx (Not Specified)    Review of Systems A complete 10 system ROS was obtained and was negative apart from what is mentioned.   Objective:   VITALS:   Vitals:   07/23/17 0828  BP: 118/70  Pulse: 78  SpO2: 93%  Weight: 274 lb (124.3 kg)  Height: 6\' 4"  (1.93 m)  Gen:  Appears stated age and in NAD. HEENT:  Normocephalic, atraumatic. The mucous membranes are moist. The superficial temporal arteries are without ropiness or tenderness. Cardiovascular: Regular rate and rhythm. Lungs: Clear to auscultation bilaterally. Neck: There are no carotid bruits noted bilaterally.  NEUROLOGICAL:  Orientation:  The patient is alert and oriented x 3.  Recent and remote memory are intact.  Attention span and concentration are normal.  Able to name objects and repeat without trouble.  Fund of knowledge is appropriate Cranial nerves: There is good facial symmetry. The pupils are equal round and reactive to light bilaterally. Fundoscopic exam reveals clear disc margins bilaterally. Extraocular muscles are intact and visual fields are full to confrontational testing. Speech is fluent and clear. Soft palate rises symmetrically and there is no tongue deviation. Hearing is intact to conversational tone. Tone: Tone is good throughout. Sensation: Sensation is intact to light touch and pinprick throughout (facial, trunk, extremities). Vibration is absent at the bilateral big toe and decreased at the ankle. There is no extinction with double simultaneous stimulation. There is no sensory dermatomal level identified. Coordination:  The patient has no dysdiadichokinesia or dysmetria. Motor: Strength is 5/5 in the bilateral upper and lower extremities.  Shoulder shrug is equal bilaterally.  There is no pronator drift.  There are no fasciculations noted. DTR's: Deep tendon reflexes are 1/4 at the bilateral biceps, triceps, brachioradialis, 0/4 at the bilateral patella and achilles.  Plantar responses  are downgoing bilaterally. Gait and Station: The patient is able to ambulate without difficulty. He is wide-based.  He has trouble ambulating in a tandem fashion.   MOVEMENT EXAM: Tremor:  There is  tremor in the UE, noted most significantly with action.  It is worse on the right than the L.  It is not much worse when given a weight.  Tremor is evident with Archimedes spirals bilaterally.   There is no tremor at rest.  The patient is not able to pour water from one glass to another without spilling it.     Assessment/Plan:   1.  Essential Tremor, overall moderate  -This is evidenced by the symmetrical nature and longstanding hx of gradually getting worse.  We discussed nature and pathophysiology.  We discussed that this can continue to gradually get worse with time.  We discussed that some medications can worsen this, as can caffeine use.    -Long talk with the patient about the fact that there are many factors to consider, one of which is the fact that he is on Lovenox for DVT/PE.  He had a knee replacement on May 7 and subsequently ended up in the hospital the week later with DVT/PE.  He reports that he is going to be transitioning to eliquis next week.  We discussed the fact that primidone interacts with Eliquis and he will likely need to be off of the primidone.  I told him he needs to talk to Dr. Jannifer Franklin about this.  He only plans to be on Eliquis for about 3 months.  After that, perhaps he can return to primidone at higher dosages.  He found primidone to be initially helpful, and perhaps he is just not at a higher dosage.  He also has never tried propranolol, although I am not sure that his blood pressure will tolerate this.  I would certainly leave these decisions to Dr. Jannifer Franklin.  -I talked to the patient about the logistics associated with DBS therapy.  I talked to the patient about risks/benefits/side effects  of DBS therapy.  We talked about risks which included but were not limited to infection,  paralysis, intraoperative seizure, death, stroke, bleeding around the electrode.   I talked to patient about fiducial placement 1 week prior to DBS therapy.  I talked to the patient about what to expect in the operating room, including the fact that this is an awake surgery.  We talked about battery placement as well as which is done under general anesthesia, generally approximately one week following the initial surgery.  We also talked about the fact that the patient will need to be off of medications for surgery.  The patient and family were given the opportunity to ask questions, which they did, and I answered them to the best of my ability today.  I did tell him that I would hold on further decisions until his DVT/PE issues were behind him and he agreed. He was shown pre/post op videos of patients we have done DBS on for ET.     2.  Peripheral neuropathy  -The patient has clinical examination evidence of a diffuse peripheral neuropathy, which certainly can affect gait and balance.  We discussed safety associated with peripheral neuropathy.  His is likely due to DM.  We discussed the importance of controlling the diabetes, especially if we are to consider deep brain stimulation surgery.  3.  I will plan on seeing the patient back in February.  In the meantime, he is going to be receiving his medical management for neurology from [facility] neurology.  Much greater than 50% of this visit was spent in counseling and coordinating care.  Total face to face time:  60 min    CC:  [name], MD

## 2017-07-23 ENCOUNTER — Encounter: Payer: Self-pay | Admitting: Neurology

## 2017-07-23 ENCOUNTER — Ambulatory Visit (INDEPENDENT_AMBULATORY_CARE_PROVIDER_SITE_OTHER): Payer: Medicare Other | Admitting: Neurology

## 2017-07-23 VITALS — BP 118/70 | HR 78 | Ht 76.0 in | Wt 274.0 lb

## 2017-07-23 DIAGNOSIS — E1142 Type 2 diabetes mellitus with diabetic polyneuropathy: Secondary | ICD-10-CM

## 2017-07-23 DIAGNOSIS — G250 Essential tremor: Secondary | ICD-10-CM | POA: Diagnosis not present

## 2017-07-23 DIAGNOSIS — I2699 Other pulmonary embolism without acute cor pulmonale: Secondary | ICD-10-CM

## 2017-08-17 ENCOUNTER — Ambulatory Visit: Payer: Medicare Other | Admitting: Neurology

## 2017-12-23 ENCOUNTER — Ambulatory Visit: Payer: Medicare Other | Admitting: Adult Health

## 2017-12-28 ENCOUNTER — Ambulatory Visit: Payer: Medicare Other | Admitting: Neurology

## 2017-12-28 ENCOUNTER — Encounter: Payer: Self-pay | Admitting: Neurology

## 2017-12-28 VITALS — BP 142/86 | HR 72 | Ht 76.0 in | Wt 278.0 lb

## 2017-12-28 DIAGNOSIS — Z86711 Personal history of pulmonary embolism: Secondary | ICD-10-CM | POA: Diagnosis not present

## 2017-12-28 DIAGNOSIS — R251 Tremor, unspecified: Secondary | ICD-10-CM

## 2017-12-28 DIAGNOSIS — Z01818 Encounter for other preprocedural examination: Secondary | ICD-10-CM

## 2017-12-28 NOTE — Patient Instructions (Addendum)
1.  Decrease primidone to 50 mg once per day for a week and then stop it.  2. You have been referred for a neurocognitive evaluation in our office.   The evaluation takes approximately two hours. The first part of the appointment is a clinical interview with the neuropsychologist (Dr. Macarthur Critchley). Please bring someone with you to this appointment if possible, as it is helpful for Dr. Si Raider to hear from both you and another adult who knows you well. After speaking with Dr. Si Raider, you will complete testing with her technician. The testing includes a variety of tasks- mostly question-and-answer, some paper-and-pencil. There is nothing you need to do to prepare for this appointment, but having a good night's sleep prior to the testing, and bringing eyeglasses and hearing aids (if you wear them), is advised.   About a week after the evaluation, you will return to follow up with Dr. Si Raider to review the test results. This appointment is about 30 minutes. If you would like a family member to receive this information as well, please bring them to the appointment.   We have to reserve several hours of the neuropsychologist's time and the psychometrician's time for your evaluation appointment. As such, please note that there is a No-Show fee of $100. If you are unable to attend any of your appointments, please contact our office as soon as possible to reschedule.

## 2017-12-28 NOTE — Progress Notes (Signed)
Subjective:   Joshua [name] was seen in consultation in the movement disorder clinic at the request of [name], MD.  His PCP is [name], MD.  This patient is accompanied in the office by his wife who supplements the history.  The evaluation is for possible DBS for tremor.  The records that were made available to me were reviewed.  Tremor started approximately27 years ago.  It has gotten worse over the last 4 years  and involves the bilateral UE.  The R hand is worse than the L.  The mornings are worse than later in the day.    Tremor is most noticeable when he is going to hold something and when trying to approximate an object.   There is a family hx of tremor in his mother/brother/maternal uncle.  Pt started on primidone in about 2014, took it for about a year and was not sure that it was helpful.  He restarted it in Jan. 2018 at 25 mg bid.  He has tried weighted pens, which were somewhat helpful, but weighted utensils were not.  At the end of July, 2018, his primidone was increased to 50 mg bid.  He expressed interest in getting information on DBS therapy.  Affected by caffeine:  No. (drinks 1 cup coffee per day; drinks 1-2 large glasses tea per day) Affected by alcohol:  No. (none since May; prior 1-2 per week) Affected by stress:  No. Affected by fatigue:  No. Spills soup if on spoon:  Yes.   Spills glass of liquid if full:  Yes.   Affects ADL's (tying shoes, brushing teeth, etc):  Yes.   (shaves with blade with 2 hands)  Current/Previously tried tremor medications: primidone  Current medications that may exacerbate tremor:  n/a  Outside reports reviewed: historical medical records, lab reports and referral letter/letters.  The patient did have a left total knee replacement on May 7.  About a week later he ended up back in the hospital with DVT/PE.  He is on Lovenox for that.  12/28/17 update: Patient is seen back in follow-up, as consideration for possible DBS therapy for essential  tremor.  We held off on further exploration last visit because he had a DVT/PE in May and was on Lovenox.  I called PCP office and got notes from last office visit.  he is now on eliquis.  Patient reports today that the plan was changed and he needed to be on the eliquis for a year (until May).  He is still on primidone, 50 mg twice per day.  He has an appointment later this week with Dr. Jannifer Franklin at [facility] neurolog[facility] but thinks he has to cancel that.  It was originally supposed to be last week but [facility] changed the appt and now he cant make it b/c he is working.  No Known Allergies  Outpatient Encounter Medications as of 12/28/2017  Medication Sig  . apixaban (ELIQUIS) 5 MG TABS tablet Take 5 mg by mouth 2 (two) times daily.  Marland Kitchen atorvastatin (LIPITOR) 40 MG tablet Take 40 mg by mouth at bedtime.   . docusate sodium (COLACE) 100 MG capsule Take 1 capsule (100 mg total) by mouth 2 (two) times daily.  Marland Kitchen escitalopram (LEXAPRO) 20 MG tablet Take 20 mg by mouth at bedtime.  . fenofibrate 54 MG tablet Take 54 mg by mouth daily.  . hydrochlorothiazide (HYDRODIURIL) 25 MG tablet Take 25 mg by mouth daily.  Marland Kitchen losartan (COZAAR) 100 MG tablet Take 100  mg by mouth daily.  . Lutein-Zeaxanthin 25-5 MG CAPS Take 1 tablet by mouth daily.  . methocarbamol (ROBAXIN) 500 MG tablet Take 1 tablet (500 mg total) by mouth every 6 (six) hours as needed for muscle spasms.  . Multiple Vitamins-Minerals (MULTIVITAMIN PO) Take 1 tablet by mouth daily.   . Omega-3 Fatty Acids (FISH OIL) 1000 MG CAPS Take 2,000 mg by mouth daily. Reported on 02/13/2016  . Polyethyl Glycol-Propyl Glycol (SYSTANE OP) Place 1-2 drops into both eyes 2 (two) times daily as needed (dry eyes).  . polyethylene glycol (MIRALAX / GLYCOLAX) packet Take 17 g by mouth 2 (two) times daily.  . primidone (MYSOLINE) 50 MG tablet Take 1 tablet (50 mg total) by mouth 2 (two) times daily.  Nelva Nay SOLOSTAR 300 UNIT/ML SOPN Inject 70 Units into the skin 2 (two) times  daily. Breakfast and supper  . TRULICITY 1.5 GH/8.2XH SOPN Inject 1.5 mg into the skin once a week. Friday  . [DISCONTINUED] enoxaparin (LOVENOX) 120 MG/0.8ML injection Inject 0.8 mLs (120 mg total) into the skin every 12 (twelve) hours.  . [DISCONTINUED] metFORMIN (GLUCOPHAGE) 1000 MG tablet Take 1,000 mg by mouth 2 (two) times daily with a meal.   No facility-administered encounter medications on file as of 12/28/2017.     Past Medical History:  Diagnosis Date  . Benign essential tremor   . Benign positional vertigo   . CVA (cerebral vascular accident) ([place])    x2 - L retina, 1 right parietal  . Degenerative arthritis   . Diabetes mellitus   . Dyslipidemia   . GERD (gastroesophageal reflux disease)    hiatal hernia  . Gout   . H/O: vasectomy   . Hx of appendectomy   . Hx of tonsillectomy   . Hypertension   . 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 Melanoma ([place])   . Obesity   . OSA on CPAP    setting = 5  . Tremor, essential 06/22/2017    Past Surgical History:  Procedure Laterality Date  . APPENDECTOMY    . arthroscopic knee surgery Bilateral   . CATARACT EXTRACTION Bilateral   . NASAL SEPTUM SURGERY    . TONSILLECTOMY    . TOTAL KNEE ARTHROPLASTY Left 03/30/2017   Procedure: LEFT TOTAL KNEE ARTHROPLASTY;  Surgeon: Paralee Cancel, MD;  Location: [facility];  Service: Orthopedics;  Laterality: Left;    Social History   Socioeconomic History  . Marital status: Married    Spouse name: [name]  . Number of children: 2  . Years of education: Bachelors   . Highest education level: Not on file  Social Needs  . Financial resource strain: Not on file  . Food insecurity - worry: Not on file  . Food insecurity - inability: Not on file  . Transportation needs - medical: Not on file  . Transportation needs - non-medical: Not on file  Occupational History  . Occupation: retired    Fish farm manager: [employer]    Comment: teaching/coaching  Tobacco Use  . Smoking  status: Former Smoker    Packs/day: 1.00    Years: 10.00    Pack years: 10.00    Types: Cigarettes    Last attempt to quit: 11/25/1983    Years since quitting: 34.1  . Smokeless tobacco: Never Used  Substance and Sexual Activity  . Alcohol use: Yes    Alcohol/week: 1.2 oz    Types: 2 Standard drinks or equivalent per week  Comment: occassionally  . Drug use: No  . Sexual activity: Not on file  Other Topics Concern  . Not on file  Social History Narrative   Patient lives at home with wife. [name](   Patient has 2 children that are in good health.    Patient works for [employer]. Retired .   Patient has a Bachelors degree in History.     Family Status  Relation Name Status  . Mother  Deceased at age 59       Cerebral aneurysm  . Father  Deceased at age 45       Alzheimer's disease  . Brother  Alive  . Mat Uncle  (Not Specified)  . MGM  (Not Specified)  . Son 2 Alive  . Neg Hx  (Not Specified)    Review of Systems A complete 10 system ROS was obtained and was negative apart from what is mentioned.   Objective:   VITALS:   Vitals:   12/28/17 1056  BP: (!) 142/86  Pulse: 72  SpO2: 94%  Weight: 278 lb (126.1 kg)  Height: 6\' 4"  (1.93 m)   Gen:  Appears stated age and in NAD. HEENT:  Normocephalic, atraumatic. The mucous membranes are moist. The superficial temporal arteries are without ropiness or tenderness. Cardiovascular: Regular rate and rhythm. Lungs: Clear to auscultation bilaterally. Neck: There are no carotid bruits noted bilaterally.  NEUROLOGICAL:  Orientation:  The patient is alert and oriented x 3.   Cranial nerves: There is good facial symmetry.  Extraocular muscles are intact and visual fields are full to confrontational testing. Speech is fluent and clear. Soft palate rises symmetrically and there is no tongue deviation. Hearing is intact to conversational tone. Tone: Tone is good throughout. Sensation: Sensation is intact to light  touch throughout. Motor: Strength is 5/5 in the bilateral upper and lower extremities.  Shoulder shrug is equal bilaterally.  There is no pronator drift.  There are no fasciculations noted. DTR's: Deep tendon reflexes are 1/4 at the bilateral biceps, triceps, brachioradialis, 0/4 at the bilateral patella and achilles.  Plantar responses are downgoing bilaterally. Gait and Station: The patient is able to ambulate without difficulty. He is wide-based.  He has trouble ambulating in a tandem fashion.   MOVEMENT EXAM: Tremor:  There is tremor in the upper extremity with posture.  It is worse on the right than the left.  It is actually better when he was given a weight today.  He has tremor with Archimedes spirals. ' Labs: I reviewed lab work from his primary care physician dated October 08, 2017.  Sodium was 140, potassium 4.7, chloride 102, CO2 28, BUN 26 and creatinine 1.5.  Glucose was elevated at 219.  White blood cells were 10.2, hemoglobin 13.7, hematocrit 40.6 and platelets 270.     Assessment/Plan:   1.  Essential Tremor, overall moderate  -This is evidenced by the symmetrical nature and longstanding hx of gradually getting worse.  We discussed nature and pathophysiology.  We discussed that this can continue to gradually get worse with time.  We discussed that some medications can worsen this, as can caffeine use.    -I am going to stop his primidone given the fact he is on Eliquis for postop DVT/PE.  We talked about the interaction last visit and I wanted him to talk to Dr. Jannifer Franklin about this since he was prescribing it, but I am not sure that he remembered.  He has been on Eliquis since  last May.  -He plans on getting off of Eliquis in May.  He is very interested in DBS surgery.  In possible preparation for that, we decided to go ahead and schedule neurocognitive testing.   2.  Peripheral neuropathy  -The patient has clinical examination evidence of a diffuse peripheral neuropathy, which  certainly can affect gait and balance.  We discussed safety associated with peripheral neuropathy.  His is likely due to DM.  We discussed the importance of controlling the diabetes, especially if we are to consider deep brain stimulation surgery.  3.  I will plan on seeing the patient back in the summer, sooner should new neurologic issues arise.    CC:  [name], MD

## 2017-12-31 ENCOUNTER — Ambulatory Visit: Payer: Medicare Other | Admitting: Adult Health

## 2018-01-14 DIAGNOSIS — R040 Epistaxis: Secondary | ICD-10-CM | POA: Insufficient documentation

## 2018-02-08 ENCOUNTER — Ambulatory Visit (INDEPENDENT_AMBULATORY_CARE_PROVIDER_SITE_OTHER): Payer: Medicare Other

## 2018-02-08 ENCOUNTER — Ambulatory Visit: Payer: Medicare Other | Admitting: Podiatry

## 2018-02-08 DIAGNOSIS — L97529 Non-pressure chronic ulcer of other part of left foot with unspecified severity: Secondary | ICD-10-CM | POA: Diagnosis not present

## 2018-02-08 DIAGNOSIS — E11621 Type 2 diabetes mellitus with foot ulcer: Secondary | ICD-10-CM

## 2018-02-08 MED ORDER — CEPHALEXIN 500 MG PO CAPS: 500.0000 mg | ORAL_CAPSULE | Freq: Three times a day (TID) | ORAL | 0 refills | Status: DC

## 2018-02-08 NOTE — Progress Notes (Signed)
Subjective: 67 year old male presents the office today for concerns of a thick callus to the ball of his left foot which is been ongoing for last couple of weeks.  He states that he cannot come in sooner because he is having some family issues.  He does not notice any drainage or pus coming from the area but he has noticed some blood underneath the callus.  He said no recent treatment for this.  Is also inquired about new diabetic shoes.  He has no other concerns today. Denies any systemic complaints such as fevers, chills, nausea, vomiting. No acute changes since last appointment, and no other complaints at this time.   Objective: AAO x3, NAD DP/PT pulses palpable bilaterally, CRT less than 3 seconds Sensation decreased with Simms Weinstein monofilament On the left foot some metatarsal/thick hyperkeratotic lesion with a small area of blister along the distal lateral aspect.  Upon debridement there is a superficial granular wound ulcer present without any probing, undermining or tunneling.  There is no surrounding erythema, ascending cellulitis.  There is no fluctuation or crepitation.  There is no malodor.  Prominence of metatarsal head plantarly with atrophy of the fat pad. No open lesions or pre-ulcerative lesions.  No pain with calf compression, swelling, warmth, erythema  Assessment: Ulceration left foot  Plan: -All treatment options discussed with the patient including all alternatives, risks, complications.  -X-rays were obtained and reviewed.  No evidence of acute fracture identified.  No definitive evidence of osteomyelitis or soft tissue emphysema.  Arthritic changes are present. -Sharply debrided the wound after verbal consent was obtained to remove all hyperkeratotic tissue on the area debrided the wound to healthy, granular tissue.  This was completed without complications.  Silvadene was applied.  I will have her continue with daily dressing changes with Silvadene.  Prescribed Keflex.   Offloading at all times.  The surgical shoe at home and I  want him to go back into this.  Watch for signs or symptoms of infection. -Paperwork completed for precertification of diabetic shoes. -Follow-up in 1 week with me as well as with Liliane Channel for molding of diabetic shoes.  Trula Slade DPM

## 2018-02-09 MED ORDER — SILVER SULFADIAZINE 1 % EX CREA: 1.0000 "application " | TOPICAL_CREAM | Freq: Every day | CUTANEOUS | 0 refills | Status: DC

## 2018-02-16 ENCOUNTER — Ambulatory Visit: Payer: Medicare Other | Admitting: Podiatry

## 2018-02-16 ENCOUNTER — Ambulatory Visit: Payer: Medicare Other | Admitting: Orthotics

## 2018-02-16 DIAGNOSIS — L97529 Non-pressure chronic ulcer of other part of left foot with unspecified severity: Secondary | ICD-10-CM

## 2018-02-16 DIAGNOSIS — E1142 Type 2 diabetes mellitus with diabetic polyneuropathy: Secondary | ICD-10-CM

## 2018-02-16 NOTE — Progress Notes (Signed)
Patient came in today for fitting and eval for diabetic shoes: Patient' doctor here is Storm Frisk is PCP  Patient presents with hx diabetic foot ulcer LEFT  Patient was measured with brannock device and cast in foam for custom inserts.  Size 13 xw  Patient chose [facility]

## 2018-02-18 NOTE — Progress Notes (Signed)
Subjective: Joshua [name] presents the office today for follow-up evaluation of ulceration to the left foot.  He states he is doing well he feels that the wound is almost healed.  Has been putting Silvadene on the wound daily.  He denies any drainage or pus or any swelling or redness to his foot.  He is also scheduled to see Liliane Channel today to get more for diabetic shoes and inserts.  He has no other concerns today. Denies any systemic complaints such as fevers, chills, nausea, vomiting. No acute changes since last appointment, and no other complaints at this time.   Objective: AAO x3, NAD DP/PT pulses palpable bilaterally, CRT less than 3 seconds On the left foot submetatarsal fourth hyperkeratotic lesion.  Upon debridement there is very small superficial granular wound present measuring 0.3 x 0.1 cm and there is no probing to bone, undermining or tunneling.  There is no surrounding erythema, ascending sialitis.  There is no fluctuation or crepitation.  There is no malodor.  There is to be almost completely healed at this point without any signs of infection. Prominent metatarsal heads No open lesions or pre-ulcerative lesions.  No pain with calf compression, swelling, warmth, erythema  Assessment: Healing ulceration left foot  Plan: -All treatment options discussed with the patient including all alternatives, risks, complications.  -Sharpy debrided the hyperkeratotic tissue to reveal a small ulceration underlying the area which appears to be almost healed.  There is no signs of infection.  Continue with Silvadene dressing changes daily as well as offloading.  He was wearing his surgical shoe for the first couple of days but since the wound is getting much better he went back to regular shoe.  Still recommended surgical shoe until completely healed. -Monitor for any clinical signs or symptoms of infection and directed to call the office immediately should any occur or go to the ER. -Patient encouraged to call  the office with any questions, concerns, change in symptoms.   Trula Slade DPM

## 2018-03-08 ENCOUNTER — Ambulatory Visit: Payer: Medicare Other | Admitting: Podiatry

## 2018-03-19 ENCOUNTER — Ambulatory Visit: Payer: Medicare Other | Admitting: Podiatry

## 2018-03-23 ENCOUNTER — Ambulatory Visit: Payer: Medicare Other | Admitting: Podiatry

## 2018-03-23 DIAGNOSIS — E1142 Type 2 diabetes mellitus with diabetic polyneuropathy: Secondary | ICD-10-CM

## 2018-03-23 DIAGNOSIS — E11621 Type 2 diabetes mellitus with foot ulcer: Secondary | ICD-10-CM | POA: Diagnosis not present

## 2018-03-23 DIAGNOSIS — L97521 Non-pressure chronic ulcer of other part of left foot limited to breakdown of skin: Secondary | ICD-10-CM | POA: Diagnosis not present

## 2018-03-23 DIAGNOSIS — M216X9 Other acquired deformities of unspecified foot: Secondary | ICD-10-CM | POA: Diagnosis not present

## 2018-03-23 DIAGNOSIS — L97529 Non-pressure chronic ulcer of other part of left foot with unspecified severity: Secondary | ICD-10-CM

## 2018-03-23 MED ORDER — SILVER SULFADIAZINE 1 % EX CREA: 1.0000 "application " | TOPICAL_CREAM | Freq: Every day | CUTANEOUS | 0 refills | Status: DC

## 2018-03-23 MED ORDER — AMOXICILLIN-POT CLAVULANATE 875-125 MG PO TABS: 1.0000 | ORAL_TABLET | Freq: Two times a day (BID) | ORAL | 0 refills | Status: DC

## 2018-03-23 NOTE — Progress Notes (Signed)
Subjective: 67 year old male presents the office with his wife for concerns of a wound to the left foot.  He did notice some bleeding coming from the area last week but denies any pus.  He also presents today possibly to pick up Diabetic shoes.  Otherwise he is doing well.  Denies any swelling or redness to his feet. Denies any systemic complaints such as fevers, chills, nausea, vomiting. No acute changes since last appointment, and no other complaints at this time.   Objective: AAO x3, NAD DP/PT pulses palpable bilaterally, CRT less than 3 seconds On the left foot submetatarsal 4 area is a hyperkeratotic lesion with central ulceration.  Upon debridement the wound measures approximate 0.3 x 0.3 cm and is superficial with a granular wound base.  There is no probing, undermining or tunneling.  There is no surrounding erythema, ascending cellulitis.  There is slight malodor.  No fluctuation or crepitation. No open lesions or pre-ulcerative lesions.  No pain with calf compression, swelling, warmth, erythema  Assessment: Ulceration left submetatarsal   Plan: -All treatment options discussed with the patient including all alternatives, risks, complications.  -We will check a debrided without any complications to healthy, granular tissue utilizing a #312 blade scalpel.  Continue with Silvadene dressing changes daily which I prescribed for him.  Also given a slight malodor we will do Augmentin.  This was also sent to his pharmacy.  Continue offloading at all times.  Diabetic shoes were dispensed by Liliane Channel.  Break instructions were discussed. -Patient encouraged to call the office with any questions, concerns, change in symptoms.  -RTC 2 weeks or sooner if needed.   Trula Slade DPM

## 2018-03-25 DIAGNOSIS — Z9119 Patient's noncompliance with other medical treatment and regimen: Secondary | ICD-10-CM | POA: Insufficient documentation

## 2018-03-25 DIAGNOSIS — Z91199 Patient's noncompliance with other medical treatment and regimen due to unspecified reason: Secondary | ICD-10-CM | POA: Insufficient documentation

## 2018-04-05 ENCOUNTER — Other Ambulatory Visit: Payer: Medicare Other | Admitting: Orthotics

## 2018-04-06 ENCOUNTER — Ambulatory Visit: Payer: Medicare Other | Admitting: Orthotics

## 2018-04-06 DIAGNOSIS — L97521 Non-pressure chronic ulcer of other part of left foot limited to breakdown of skin: Secondary | ICD-10-CM

## 2018-04-06 NOTE — Progress Notes (Signed)
Reordered shoes [id number]

## 2018-04-07 NOTE — Progress Notes (Signed)
Reason for visit: Essential tremor  Joshua [name] is an 67 y.o. male  History of present illness:  06/22/17 Dr. Jannifer Franklin: Joshua [name] is a 67 year old right-handed white male with a history of an essential tremor that has gradually worsened over time. The patient is on Mysoline in low dose taking 50 mg tablet, one half tablet twice daily. The patient is tolerating the medication but he is not getting much benefit with the tremor itself. The patient recently had a left total knee replacement, he is still getting over the surgery. The patient required admission to the hospital on 04/09/2017 several weeks after the knee surgery for shortness of breath associated with a pulmonary embolism. The patient has recovered from this. The patient needs to have significant tremor with feeding himself and with handwriting. He works part-time as a Pharmacist, hospital. He does have a history of vertigo, he may have some slight gait instability associated with this. He returns to this office for an evaluation.  04/07/18 update: Patient returns today for follow-up visit.  He has recently stopped his Eliquis which he was on for 1 year after PE.  He restarted his primidone 50 mg twice daily for tremors.  Patient also restart aspirin 81 mg for secondary stroke prevention without side effects of bleeding or bruising.  Patient also continues Lipitor without side effects of myalgias.  Patient did have appointment with Dr. Carles Collet for possible DBS procedure on 12/28/2017 but as patient was on Eliquis she wanted to hold off until Eliquis therapy was completed.  Patient does have appointment for neurocognitive testing in September and will follow-up with Dr. Carles Collet at that time for possible DBS procedure.  Since patient has restarted primidone, he is doing fairly well with tremors.  He does state that they worsen at times but is unable to say when exactly they worsen.  He states his handwriting is bad at times and does interfere with daily activities  around the house when they do worsen.  Patient continues to work part-time and continues to stay active.  Patient does have a annual physical appointment with PCP Dr. Virgina Jock next week and will have all routine blood work done.     Past Medical History:  Diagnosis Date  . Benign essential tremor   . Benign positional vertigo   . CVA (cerebral vascular accident) ([place])    x2 - L retina, 1 right parietal  . Degenerative arthritis   . Diabetes mellitus   . Dyslipidemia   . GERD (gastroesophageal reflux disease)    hiatal hernia  . Gout   . H/O: vasectomy   . Hx of appendectomy   . Hx of tonsillectomy   . Hypertension   . 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 Melanoma ([place])   . Obesity   . OSA on CPAP    setting = 5  . Tremor, essential 06/22/2017    Past Surgical History:  Procedure Laterality Date  . APPENDECTOMY    . arthroscopic knee surgery Bilateral   . CATARACT EXTRACTION Bilateral   . NASAL SEPTUM SURGERY    . TONSILLECTOMY    . TOTAL KNEE ARTHROPLASTY Left 03/30/2017   Procedure: LEFT TOTAL KNEE ARTHROPLASTY;  Surgeon: Paralee Cancel, MD;  Location: [facility];  Service: Orthopedics;  Laterality: Left;    Family History  Problem Relation Age of Onset  . Cerebral aneurysm Mother   . Tremor Mother   . Alzheimer's disease Father   . Tremor Brother   .  Tremor Maternal Uncle   . Diabetes Maternal Grandmother   . Colon cancer Neg Hx     Social history:  reports that he quit smoking about 34 years ago. His smoking use included cigarettes. He has a 10.00 pack-year smoking history. He has never used smokeless tobacco. He reports that he drinks about 1.2 oz of alcohol per week. He reports that he does not use drugs.   No Known Allergies  Medications:  Prior to Admission medications   Medication Sig Start Date End Date Taking? Authorizing Provider  atorvastatin (LIPITOR) 40 MG tablet Take 40 mg by mouth at bedtime.    Yes [provider]  docusate sodium  (COLACE) 100 MG capsule Take 1 capsule (100 mg total) by mouth 2 (two) times daily. 03/30/17  Yes Babish, Rodman Key, PA-C  escitalopram (LEXAPRO) 20 MG tablet Take 20 mg by mouth at bedtime.   Yes [provider]  fenofibrate 54 MG tablet Take 54 mg by mouth daily.   Yes [provider]  ferrous sulfate (FERROUSUL) 325 (65 FE) MG tablet Take 1 tablet (325 mg total) by mouth daily with breakfast. 04/10/17  Yes Florencia Reasons, MD  hydrochlorothiazide (HYDRODIURIL) 25 MG tablet Take 25 mg by mouth daily.   Yes [provider]  HYDROcodone-acetaminophen (NORCO) 7.5-325 MG tablet Take 1-2 tablets by mouth every 4 (four) hours as needed for moderate pain or severe pain. 03/30/17  Yes Babish, Rodman Key, PA-C  losartan (COZAAR) 100 MG tablet Take 100 mg by mouth daily.   Yes [provider]  Lutein-Zeaxanthin 25-5 MG CAPS Take 1 tablet by mouth daily.   Yes [provider]  metFORMIN (GLUCOPHAGE) 1000 MG tablet Take 1,000 mg by mouth 2 (two) times daily with a meal.   Yes [provider]  methocarbamol (ROBAXIN) 500 MG tablet Take 1 tablet (500 mg total) by mouth every 6 (six) hours as needed for muscle spasms. 03/30/17  Yes Babish, Rodman Key, PA-C  Multiple Vitamins-Minerals (MULTIVITAMIN PO) Take 1 tablet by mouth daily.    Yes [provider]  Omega-3 Fatty Acids (FISH OIL) 1000 MG CAPS Take 2,000 mg by mouth daily. Reported on 02/13/2016   Yes [provider]  Polyethyl Glycol-Propyl Glycol (SYSTANE OP) Place 1-2 drops into both eyes 2 (two) times daily as needed (dry eyes).   Yes [provider]  polyethylene glycol (MIRALAX / GLYCOLAX) packet Take 17 g by mouth 2 (two) times daily. 03/30/17  Yes Babish, Rodman Key, PA-C  primidone (MYSOLINE) 50 MG tablet Take 1/2 tablet PO at bedtime for 1 week, if tolerating increase to 1/2 tablet BID 12/22/16  Yes Millikan, Megan, NP  TOUJEO SOLOSTAR 300 UNIT/ML SOPN Inject 70 Units into the skin 2 (two) times  daily. Breakfast and supper 07/31/15  Yes [provider]  TRULICITY 1.5 VP/7.1GG SOPN Inject 1.5 mg into the skin once a week. Friday 08/02/16  Yes [provider]  enoxaparin (LOVENOX) 120 MG/0.8ML injection Inject 0.8 mLs (120 mg total) into the skin every 12 (twelve) hours. 04/11/17 04/14/17  Murlean Iba, MD    ROS:  Out of a complete 14 system review of symptoms, the patient complains only of the following symptoms, and all other reviewed systems are negative.  Hearing loss, eye itching, apnea, frequency of urination, joint pain, dizziness, and tremors  Blood pressure 137/82, pulse 84, height 6\' 4"  (1.93 m), weight 285 lb 12.8 oz (129.6 kg).  Physical Exam  General: The patient is alert and cooperative at  the time of the examination. The patient is moderately obese.  Neuromuscular: The left knee with surgical scar that is healed well.  Skin: No significant peripheral edema is noted.   Neurologic Exam  Mental status: The patient is alert and oriented x 3 at the time of the examination. The patient has apparent normal recent and remote memory, with an apparently normal attention span and concentration ability.   Cranial nerves: Facial symmetry is present. Speech is normal, no aphasia or dysarthria is noted. Extraocular movements are full. Visual fields are full.  Motor: The patient has good strength in all 4 extremities.  Sensory examination: Soft touch sensation is symmetric on the face, arms, and legs.  Coordination: The patient has good finger-nose-finger and heel-to-shin bilaterally. The patient has tremors associated with finger-nose-finger bilaterally.  Tremors absent at rest  Gait and station: The patient has a limping type gait on the left leg. Tandem gait is slightly unsteady. Romberg is negative. No drift is seen.  Reflexes: Deep tendon reflexes are symmetric.   Assessment/Plan:  1. Essential tremor  Patient has restarted primidone 50 mg  twice daily.  Patient has neurocognitive testing in September for possible DBS procedure and will follow up with Dr. Carles Collet after testing is completed.  Overall, tremors are fairly controlled on current primidone dose and no need to adjust at this time.  Patient will follow-up with Dr. Jannifer Franklin in October as at that time he will have neurocognitive testing completed and possible plans for DBS procedure.  Patient agreeable to this plan and has no further concerns or questions at this time.  Patient will call with increasing in symptoms or concerns.   Venancio Poisson, AGNP-BC  [facility] [address] [place], [place] [address]  Phone [phone] Fax [phone]

## 2018-04-08 ENCOUNTER — Ambulatory Visit: Payer: Medicare Other | Admitting: Adult Health

## 2018-04-08 ENCOUNTER — Encounter: Payer: Self-pay | Admitting: Adult Health

## 2018-04-08 VITALS — BP 137/82 | HR 84 | Ht 76.0 in | Wt 285.8 lb

## 2018-04-08 DIAGNOSIS — Z794 Long term (current) use of insulin: Secondary | ICD-10-CM | POA: Diagnosis not present

## 2018-04-08 DIAGNOSIS — E119 Type 2 diabetes mellitus without complications: Secondary | ICD-10-CM | POA: Diagnosis not present

## 2018-04-08 DIAGNOSIS — E785 Hyperlipidemia, unspecified: Secondary | ICD-10-CM | POA: Diagnosis not present

## 2018-04-08 DIAGNOSIS — I10 Essential (primary) hypertension: Secondary | ICD-10-CM | POA: Diagnosis not present

## 2018-04-08 DIAGNOSIS — Z8673 Personal history of transient ischemic attack (TIA), and cerebral infarction without residual deficits: Secondary | ICD-10-CM | POA: Diagnosis not present

## 2018-04-08 DIAGNOSIS — G250 Essential tremor: Secondary | ICD-10-CM | POA: Diagnosis not present

## 2018-04-08 DIAGNOSIS — R251 Tremor, unspecified: Secondary | ICD-10-CM

## 2018-04-08 MED ORDER — ASPIRIN 81 MG PO TABS: 81.0000 mg | ORAL_TABLET | Freq: Every day | ORAL | 0 refills | Status: DC

## 2018-04-08 NOTE — Progress Notes (Signed)
I have read the note, and I agree with the clinical assessment and plan.  Richard A. Sater, MD, PhD, FAAN Certified in Neurology, Clinical Neurophysiology, Sleep Medicine, Pain Medicine and Neuroimaging  [facility] [address], [address] [place], [place] [address] [phone]  

## 2018-04-08 NOTE — Progress Notes (Signed)
I have read the note, and I agree with the clinical assessment and plan.  Ashar Lewinski K [place]   

## 2018-04-08 NOTE — Patient Instructions (Signed)
Your Plan:  Continue primidone 50mg  twice a day  Continue aspirin and lipitor for secondary stroke prevention  Keep scheduled appointment for neurocognitive testing  Follow up with Dr. Carles Collet regarding possible DBS     Thank you for coming to see [place] at [facility]. I hope we have been able to provide you high quality care today.  You may receive a patient satisfaction survey over the next few weeks. We would appreciate your feedback and comments so that we may continue to improve ourselves and the health of our patients.

## 2018-04-13 ENCOUNTER — Ambulatory Visit: Payer: Medicare Other | Admitting: Podiatry

## 2018-04-15 ENCOUNTER — Other Ambulatory Visit: Payer: Medicare Other | Admitting: Orthotics

## 2018-04-22 ENCOUNTER — Ambulatory Visit: Payer: Medicare Other | Admitting: Podiatry

## 2018-04-22 ENCOUNTER — Ambulatory Visit (INDEPENDENT_AMBULATORY_CARE_PROVIDER_SITE_OTHER): Payer: Medicare Other | Admitting: Orthotics

## 2018-04-22 VITALS — Temp 98.7°F

## 2018-04-22 DIAGNOSIS — L97521 Non-pressure chronic ulcer of other part of left foot limited to breakdown of skin: Secondary | ICD-10-CM | POA: Diagnosis not present

## 2018-04-22 DIAGNOSIS — L97529 Non-pressure chronic ulcer of other part of left foot with unspecified severity: Secondary | ICD-10-CM

## 2018-04-22 DIAGNOSIS — E11621 Type 2 diabetes mellitus with foot ulcer: Secondary | ICD-10-CM

## 2018-04-22 DIAGNOSIS — E1142 Type 2 diabetes mellitus with diabetic polyneuropathy: Secondary | ICD-10-CM

## 2018-04-22 NOTE — Progress Notes (Signed)
Added further offload 1st MPJ

## 2018-04-22 NOTE — Progress Notes (Signed)
Subjective: Joshua [name] presents the office today for follow-up evaluation of a wound on the bottom of his left foot. He states that he has been doing well but he has noticed a small amount of blood in the last couple of days.  Denies any pus.  Denies any redness or drainage or any swelling or any red streaks.  He has no new concerns. Denies any systemic complaints such as fevers, chills, nausea, vomiting. No acute changes since last appointment, and no other complaints at this time.   Objective: AAO x3, NAD DP/PT pulses palpable bilaterally, CRT less than 3 seconds On the plantar aspect of left foot submetatarsal 4 is a hyperkeratotic lesion with a central ulceration.  After debridement the wound measures 0.3 x 0.3 x 0.1 cm.  It is superficial there is no probing, undermining or tunneling.  There is no surrounding erythema, ascending cellulitis.  There is no fluctuation or crepitation or any malodor.  No other open lesions identified today. No pain with calf compression, swelling, warmth, erythema  Assessment: Ulceration left foot  Plan: -All treatment options discussed with the patient including all alternatives, risks, complications.  -After verbal consent was obtained the area was cleaned with alcohol and utilizing #312 blade scalpel the wound was debrided to healthy, granular tissue.  Hemostasis was achieved.  Silvadene was applied followed by dressing.  Continue with daily dressing changes with Silvadene.  Offloading pads were also dispensed.  He saw over today to pick up his diabetic shoes and inserts.  I will see him back in the next couple weeks before he goes to [place] on his vacation. -Monitor for any clinical signs or symptoms of infection and directed to call the office immediately should any occur or go to the ER -Patient encouraged to call the office with any questions, concerns, change in symptoms.   Trula Slade DPM

## 2018-05-13 ENCOUNTER — Ambulatory Visit: Payer: Medicare Other | Admitting: Podiatry

## 2018-05-13 ENCOUNTER — Encounter: Payer: Self-pay | Admitting: Podiatry

## 2018-05-13 DIAGNOSIS — L97521 Non-pressure chronic ulcer of other part of left foot limited to breakdown of skin: Secondary | ICD-10-CM | POA: Diagnosis not present

## 2018-05-13 DIAGNOSIS — E11621 Type 2 diabetes mellitus with foot ulcer: Secondary | ICD-10-CM

## 2018-05-13 DIAGNOSIS — L97529 Non-pressure chronic ulcer of other part of left foot with unspecified severity: Secondary | ICD-10-CM | POA: Diagnosis not present

## 2018-05-13 MED ORDER — CEPHALEXIN 500 MG PO CAPS: 500.0000 mg | ORAL_CAPSULE | Freq: Three times a day (TID) | ORAL | 2 refills | Status: DC

## 2018-05-14 NOTE — Progress Notes (Signed)
Subjective: [age] presents the office today for follow-up evaluation of a wound on the bottom of his left foot.  He states he has had some bleeding coming from the area but denies any surrounding redness or red streaks and denies any drainage or pus coming from the area.  He has no new concerns.  No recent injury or changes since I last saw him.  He denies any fevers, chills, nausea, vomiting.  No calf pain, chest pain, shortness of breath.  Objective: AAO x3, NAD DP/PT pulses palpable bilaterally, CRT less than 3 seconds On the plantar aspect of left foot submetatarsal 4 is a hyperkeratotic lesion with a central ulceration.  There appears to be a blister just distal to this area as well the Of the wound.  After debridement the wound measures 2 x 1 x 0.3 cm.  The wound base is granular and there is no purulence identified.  There is no surrounding erythema, ascending cellulitis.  There is no fluctuation or crepitation and there is no malodor. No other open lesions or pre-ulcerative lesions identified today. No pain with calf compression, swelling, warmth, erythema  Assessment: Ulceration left foot  Plan: -All treatment options discussed with the patient including all alternatives, risks, complications.  -After verbal consent was obtained the area was cleaned with alcohol and utilizing #312 blade scalpel the wound was excisionally debrided to healthy, granular tissue.  Hemostasis was achieved.  Silvadene was applied followed by dressing.  Continue with daily dressing changes with Silvadene.  Given the increase in size of the wound will start Keflex in case of early infection although there is no clinical signs identified today.  I did modify his inserts for diabetic shoes as well to take more pressure off the area.  -Monitor for any clinical signs or symptoms of infection and directed to call the office immediately should any occur or go to the ER -Patient encouraged to call the office with any  questions, concerns, change in symptoms.   Trula Slade DPM

## 2018-05-20 ENCOUNTER — Encounter: Payer: Medicare Other | Admitting: Psychology

## 2018-06-14 ENCOUNTER — Encounter: Payer: Medicare Other | Admitting: Psychology

## 2018-06-18 ENCOUNTER — Ambulatory Visit (HOSPITAL_COMMUNITY)
Admission: RE | Admit: 2018-06-18 | Discharge: 2018-06-18 | Disposition: A | Discharge status: Home / Self Care | Payer: Medicare Other | Source: Ambulatory Visit | Attending: Family | Admitting: Family

## 2018-06-18 ENCOUNTER — Other Ambulatory Visit: Payer: Self-pay

## 2018-06-18 DIAGNOSIS — Z86718 Personal history of other venous thrombosis and embolism: Secondary | ICD-10-CM

## 2018-06-18 DIAGNOSIS — R9389 Abnormal findings on diagnostic imaging of other specified body structures: Secondary | ICD-10-CM | POA: Insufficient documentation

## 2018-06-18 DIAGNOSIS — M7989 Other specified soft tissue disorders: Secondary | ICD-10-CM | POA: Diagnosis not present

## 2018-06-18 DIAGNOSIS — M79604 Pain in right leg: Secondary | ICD-10-CM | POA: Insufficient documentation

## 2018-06-18 DIAGNOSIS — M79605 Pain in left leg: Secondary | ICD-10-CM | POA: Insufficient documentation

## 2018-06-22 ENCOUNTER — Encounter: Payer: Self-pay | Admitting: Podiatry

## 2018-06-22 ENCOUNTER — Ambulatory Visit: Payer: Medicare Other | Admitting: Podiatry

## 2018-06-22 DIAGNOSIS — L97521 Non-pressure chronic ulcer of other part of left foot limited to breakdown of skin: Secondary | ICD-10-CM | POA: Diagnosis not present

## 2018-06-22 DIAGNOSIS — L97529 Non-pressure chronic ulcer of other part of left foot with unspecified severity: Secondary | ICD-10-CM | POA: Diagnosis not present

## 2018-06-22 DIAGNOSIS — L03116 Cellulitis of left lower limb: Secondary | ICD-10-CM

## 2018-06-22 DIAGNOSIS — E11621 Type 2 diabetes mellitus with foot ulcer: Secondary | ICD-10-CM

## 2018-06-22 MED ORDER — AMOXICILLIN-POT CLAVULANATE 875-125 MG PO TABS: 1.0000 | ORAL_TABLET | Freq: Two times a day (BID) | ORAL | 0 refills | Status: DC

## 2018-06-23 NOTE — Progress Notes (Signed)
Subjective: Joshua [name] presents to the office today for concerns of recurrent wound to the left foot. He states he recently went on vacation to [place] and did a lot of walking and did well but was in [place] recently and he experienced that the wound has been getting larger. It has been bleeding but no pus. He has had some swelling to the foot but no redness or red streaking.  Denies any systemic complaints such as fevers, chills, nausea, vomiting. No acute changes since last appointment, and no other complaints at this time.   Objective: AAO x3, NAD DP/PT pulses palpable bilaterally, CRT less than 3 seconds On the left foot submetatarsal 4 is a hyperkeratotic lesion with central ulcer. There is a flaccid blister adjacent to the area with minimal clear drainage. Upon debridement there was some macerated tissue but no pus. The wound is superficial but is larger than it has been. There is mild increase in swelling to the foot compared to the contralateral extremity. No increase in warmth or erythema. No open lesions or pre-ulcerative lesions.  No pain with calf compression, swelling, warmth, erythema  Assessment: Ulceration left foot/blister with increase in swelling  Plan: -All treatment options discussed with the patient including all alternatives, risks, complications.  -Utilizing a #312 blade scalpel I sharply debrided the wound to healthy, granular tissue. There was some underlying macerated tissue. I also debrided the old blister. Recommend betadine wet to dry to the area daily. Will start Augmentin.  Offloading at all time and would wear the offloading shoe.  -Rick evaluated his inserts today and we are going to have him come back in for modifications -Monitor for any clinical signs or symptoms of infection and directed to call the office immediately should any occur or go to the ER. -Patient encouraged to call the office with any questions, concerns, change in symptoms.   Return in  about 1 week (around 06/29/2018).  Trula Slade DPM

## 2018-06-24 ENCOUNTER — Encounter (HOSPITAL_COMMUNITY): Payer: Medicare Other

## 2018-06-24 ENCOUNTER — Other Ambulatory Visit: Payer: Medicare Other | Admitting: Orthotics

## 2018-06-29 ENCOUNTER — Ambulatory Visit: Payer: Medicare Other | Admitting: Podiatry

## 2018-07-01 ENCOUNTER — Other Ambulatory Visit (HOSPITAL_COMMUNITY): Payer: Self-pay | Admitting: Internal Medicine

## 2018-07-01 ENCOUNTER — Ambulatory Visit (HOSPITAL_COMMUNITY)
Admission: RE | Admit: 2018-07-01 | Discharge: 2018-07-01 | Disposition: A | Discharge status: Home / Self Care | Payer: Medicare Other | Source: Ambulatory Visit | Attending: Vascular Surgery | Admitting: Vascular Surgery

## 2018-07-01 DIAGNOSIS — Z86711 Personal history of pulmonary embolism: Secondary | ICD-10-CM | POA: Diagnosis not present

## 2018-07-01 DIAGNOSIS — R600 Localized edema: Secondary | ICD-10-CM

## 2018-07-12 ENCOUNTER — Ambulatory Visit: Payer: Medicare Other | Admitting: Podiatry

## 2018-07-12 DIAGNOSIS — L97521 Non-pressure chronic ulcer of other part of left foot limited to breakdown of skin: Secondary | ICD-10-CM

## 2018-07-12 DIAGNOSIS — L97529 Non-pressure chronic ulcer of other part of left foot with unspecified severity: Secondary | ICD-10-CM

## 2018-07-12 DIAGNOSIS — E11621 Type 2 diabetes mellitus with foot ulcer: Secondary | ICD-10-CM

## 2018-07-14 NOTE — Progress Notes (Signed)
Subjective: 67 year old male presents the office today for evaluation of a wound to his left foot.  He missed last week's appointment because he was with his grandkids.  He is one in the gym today noticed some blood but denies any pus or increase in swelling or redness.  He has no other concerns today.  Overall he feels well he denies any systemic complaints such as fevers, chills, nausea, vomiting. No acute changes since last appointment, and no other complaints at this time.   Objective: AAO x3, NAD DP/PT pulses palpable bilaterally, CRT less than 3 seconds On the left foot submetatarsal post hyperkeratotic lesion with central ulceration.  All the blister on the area has resolved and there is one central ulceration which measures 0.5 by 0.2 cm.  There is a superficial there is no probing to bone, undermining or tunneling.  There is no surrounding erythema, ascending cellulitis.  No fluctuation or crepitation or any malodor. No open lesions or pre-ulcerative lesions.  No pain with calf compression, swelling, warmth, erythema  Assessment: Ulceration left foot without signs of infection  Plan: -All treatment options discussed with the patient including all alternatives, risks, complications.  -Today I sharply debrided the wound utilizing #312 blade scalpel after the areas cleaned with alcohol to remove all nonviable tissue and debrided the wound to healthy, granular tissue.  Continue with daily dressing changes.  There is no signs of infection so we will hold off any further oral antibiotics.  Taking offloading at all times. -Patient encouraged to call the office with any questions, concerns, change in symptoms.   Trula Slade DPM

## 2018-07-27 ENCOUNTER — Ambulatory Visit: Payer: Medicare Other | Admitting: Podiatry

## 2018-07-30 ENCOUNTER — Ambulatory Visit: Payer: Medicare Other | Admitting: Podiatry

## 2018-07-30 DIAGNOSIS — L97522 Non-pressure chronic ulcer of other part of left foot with fat layer exposed: Secondary | ICD-10-CM | POA: Diagnosis not present

## 2018-07-31 NOTE — Progress Notes (Signed)
Subjective: 67 year old male presents the office today for evaluation of a wound to his left foot.  He states that the area is doing better.  He does not keep the area.  It is possible that could be drained.  He has not really suddenly noticed this.  Denies any swelling or redness to his feet.  No pain.  No other concerns. Denies any systemic complaints such as fevers, chills, nausea, vomiting. No acute changes since last appointment, and no other complaints at this time.   Objective: AAO x3, NAD DP/PT pulses palpable bilaterally, CRT less than 3 seconds On the left foot submetatarsal post hyperkeratotic lesion with central ulceration.  After debridement the area measures 0.5 x 0.4 x 0.2 cm.  There is no probing to bone there is no undermining or tunneling.  There is no surrounding erythema, ascending cellulitis.  There is no fluctuation or crepitation or any signs of infection.  There was some hair present within the wound prior to debridement. No open lesions or pre-ulcerative lesions.  No pain with calf compression, swelling, warmth, erythema  Assessment: Ulceration left foot without signs of infection  Plan: -All treatment options discussed with the patient including all alternatives, risks, complications.  -Today I sharply debrided the wound utilizing #312 blade scalpel after the areas cleaned with alcohol to remove all nonviable tissue and debrided the wound to healthy, granular tissue.  We are going to switch to Medihoney dressing changes daily.  I did give him some of this to use as well.  Offloading at all times.  I want to keep a dressing on the area at all times as well. -Monitor for any clinical signs or symptoms of infection and directed to call the office immediately should any occur or go to the ER.  Return in about 2 weeks (around 08/13/2018).  Trula Slade DPM

## 2018-08-13 ENCOUNTER — Ambulatory Visit: Payer: Medicare Other | Admitting: Podiatry

## 2018-08-13 DIAGNOSIS — L97522 Non-pressure chronic ulcer of other part of left foot with fat layer exposed: Secondary | ICD-10-CM

## 2018-08-13 DIAGNOSIS — E11621 Type 2 diabetes mellitus with foot ulcer: Secondary | ICD-10-CM

## 2018-08-13 DIAGNOSIS — L97529 Non-pressure chronic ulcer of other part of left foot with unspecified severity: Secondary | ICD-10-CM

## 2018-08-15 NOTE — Progress Notes (Signed)
Subjective: 67 year old male presents the office today for evaluation of a wound to his left foot.  He gets some occasional bleeding coming from the wound but denies any pus and denies any swelling or redness.  He states that overall is doing better.  He denies any fevers, chills, nausea, vomiting.  Denies any calf pain, chest pain, shortness of breath.  He has no other concerns today.  Objective: AAO x3, NAD DP/PT pulses palpable bilaterally, CRT less than 3 seconds On the left foot submetatarsal post hyperkeratotic lesion with central ulceration.  After debridement the area measures 1 x 0.3 x 0.2 cm.  It is larger in length today because he was starting to develop a blister which I debrided today.  However the initial wound appears to be getting smaller.  There is no probing to bone there is no undermining or tunneling.  There is no surrounding erythema, ascending cellulitis.  There is no fluctuation or crepitation or any signs of infection.  There was some hair present within the wound prior to debridement. No open lesions or pre-ulcerative lesions.  No pain with calf compression, swelling, warmth, erythema  Assessment: Ulceration left foot without signs of infection  Plan: -All treatment options discussed with the patient including all alternatives, risks, complications.  -Today I sharply debrided the wound utilizing #312 blade scalpel after the areas cleaned with alcohol to remove all nonviable tissue and debrided the wound to healthy, granular tissue.  We are going to switch to Medihoney dressing changes daily.   Offloading at all times.  I want to keep a dressing on the area at all times as well.  He is afebrile today and his temperature is 97.3.  Continue to monitor for any clinical signs or symptoms of infection and directed to call the office immediately should any occur or go to the ER.  Ultimately discussed possible surgical intervention to help decrease the pressure to this area.   Therefore she is on a guarantee and resolved with a chance of transfer lesions.   Trula Slade DPM

## 2018-08-26 ENCOUNTER — Encounter: Payer: Self-pay | Admitting: Podiatry

## 2018-08-26 ENCOUNTER — Ambulatory Visit (INDEPENDENT_AMBULATORY_CARE_PROVIDER_SITE_OTHER): Payer: Medicare Other

## 2018-08-26 ENCOUNTER — Ambulatory Visit: Payer: Medicare Other | Admitting: Podiatry

## 2018-08-26 VITALS — Temp 98.7°F

## 2018-08-26 DIAGNOSIS — L97522 Non-pressure chronic ulcer of other part of left foot with fat layer exposed: Secondary | ICD-10-CM | POA: Diagnosis not present

## 2018-08-26 MED ORDER — CEPHALEXIN 500 MG PO CAPS: 500.0000 mg | ORAL_CAPSULE | Freq: Three times a day (TID) | ORAL | 2 refills | Status: DC

## 2018-08-26 MED ORDER — MUPIROCIN 2 % EX OINT: 1.0000 "application " | TOPICAL_OINTMENT | Freq: Two times a day (BID) | CUTANEOUS | 2 refills | Status: DC

## 2018-08-27 ENCOUNTER — Ambulatory Visit: Payer: Medicare Other | Admitting: Podiatry

## 2018-08-29 NOTE — Progress Notes (Signed)
Subjective: 67 year old male presents the office today for evaluation of a wound to his left foot.  He called to cancel his appointment however he did call today stating that he has noticed some bleeding to the wound is more from the area checked.  Denies any drainage or pus or any pain complaints.  Denies any redness or drainage or any swelling. He denies any fevers, chills, nausea, vomiting.  Denies any calf pain, chest pain, shortness of breath.  He has no other concerns today.  Objective: AAO x3, NAD DP/PT pulses palpable bilaterally, CRT less than 3 seconds On the left foot submetatarsal post hyperkeratotic lesion with central ulceration.  After debridement the area measures 0.6 x 0.5 x 0.3 cm.  The wound base is granular.  There is no probing to bone, undermining or tunneling.  Appears to be somewhat deeper compared to last appointment.  There is no surrounding erythema, ascending cellulitis.  There is no fluctuation or crepitation or any malodor.  No significant blister formation is present. No open lesions or pre-ulcerative lesions.  No pain with calf compression, swelling, warmth, erythema  Assessment: Ulceration left foot without signs of infection  Plan: -All treatment options discussed with the patient including all alternatives, risks, complications.  -The wound was sharply debrided the wound utilizing #312 blade scalpel after the areas cleaned with alcohol to remove all nonviable tissue and debrided the wound to healthy, granular tissue.   -We will do mupirocin ointment dressing changes daily and I prescribed Keflex as well.  Offloading at all times.  When to get back in the surgical shoe the offloading pad to be made from previously.  Again today we discussed potential surgical intervention given the recurrence of the wound is been a chronic issue for him however he is contemplating undergoing a procedure given his essential tremors. -Return in about 1 week (around  09/02/2018).  Trula Slade DPM

## 2018-09-03 ENCOUNTER — Ambulatory Visit: Payer: Medicare Other | Admitting: Podiatry

## 2018-09-06 ENCOUNTER — Ambulatory Visit: Payer: Medicare Other | Admitting: Psychology

## 2018-09-06 ENCOUNTER — Encounter: Payer: Self-pay | Admitting: Psychology

## 2018-09-06 ENCOUNTER — Ambulatory Visit (INDEPENDENT_AMBULATORY_CARE_PROVIDER_SITE_OTHER): Payer: Medicare Other | Admitting: Psychology

## 2018-09-06 ENCOUNTER — Encounter

## 2018-09-06 DIAGNOSIS — G250 Essential tremor: Secondary | ICD-10-CM | POA: Diagnosis not present

## 2018-09-06 DIAGNOSIS — Z01818 Encounter for other preprocedural examination: Secondary | ICD-10-CM

## 2018-09-06 NOTE — Progress Notes (Signed)
NEUROBEHAVIORAL STATUS EXAM   Name: Joshua [name] Date of Birth: [birth year] Date of Interview: 09/06/2018  Reason for Referral:  Joshua [name] is a 67 y.o. male who is referred by Dr. Wells Guiles [name] to assess his current level of cognitive functioning and assist in differential diagnosis. This was also part of a pre-surgical evaluation for deep brain stimulation (DBS) to assist in the management of essential tremor. This patient is accompanied in the office by his wife who supplements the history.   History of Presenting Problem:  Joshua [name] has a history of essential tremor (bilateral UE), dating back approximately 27 years ago. It has worsened in the past 4-5 years. He has been followed by [facility] for tremor and vertigo. He was seen by Dr. Carles [name] on 07/23/2017 to discuss the possibility of DBS for management of his tremor. He was on Eliquis for postop DVT/PE at the time and advised to discuss stopping primidone with Dr. Jannifer [name]. He followed up with Dr. Carles [name] on 12/28/2017 and reported continued interest in DBS procedure. Dr. Carles [name] stopped his primidone as he was still on Eliquis for postop DVT/PE. They did go ahead and get him set up for this neurocognitive evaluation as he wanted to continue to pursue the possibility of DBS. The patient followed up with [facility] on 04/08/2018. He had stopped Eliquis in early May 2019 and restarted his primidone 50 mg twice daily for tremors.   At today's visit (09/06/2018), the patient denies any significant cognitive concerns but does note that his father had Alzheimer's disease and "that is always in the recesses of my mind". His father died at age 8 and had experienced a very gradual decline in cognitive functioning over many years prior to that. There is no other known family history of AD. The patient does have a strong family history of tremors.  The patient admits to some forgetfulness, and wonders if it is due to normal aging. He  does NOT see signs in himself that are similar to how his father presented in his early stages of dementia. His wife does not have concerns about his cognitive functioning or memory. She feels as though any mild forgetfulness he experiences is normal for his age.  Upon direct questioning, the patient and his wife reported the following with regard to current cognitive functioning:   Forgetting recent conversations/events: Yes, per wife, for conversations (but she notes this seems to be due to him not paying attention in the first place); patient says this may happen but is not bothersome. No forgetting of recent events per both of them. Repeating statements/questions: No Misplacing/losing items: Yes, but this is lifelong (there has been no change) Forgetting appointments or other obligations: Yes, this has happened, but only when he over-commits or agrees to do something when he doesn't have his calendar with him. His wife notes that in the last couple of years he is doing better with keeping a calendar. Forgetting to take medications: No Difficulty concentrating: No, except for with tasks that require manual dexterity - he gets frustrated and gives up. 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 He manages instrumental ADLs without any cognitive problems. Specifically, he continues to drive, manage his medications and appointments, and do a lot of cooking. His wife has always done  the finances and bill-paying.  Physically, his main complaint is tremor. His wife notes that he has been through many medical issues in the past, but the tremor is the one that bothers him most. He also has a history of vertigo which can be very debilitating. He has not had an episode for a while. He does feel a little unsteady or off balance at times. He has not had any falls. He has a history of knee replacement last year. A week later he was back in the hospital for PE.   He reports he has had two strokes in the past, "one in the brain and one in the back of my left eye". Medical records indicate he was hospitalized in May 2012 for vertigo symptoms and brain MRI revealed small acute infarction in the right parasagittal frontal white matter/corpus collosum. There was no hemorrhage or midline shift. The patient reports that he also had a stroke 7 years ago in the back of his left eye that caused a persisting blind spot in the middle of his field of vision in the left eye.  He reports a history of a couple of concussions when playing sports in school and in the [employer]. He did experience LOC but was never hospitalized or medically treated.   Psychiatric history is reportedly significant for depression after his stroke and in the midst of several psychosocial stressors. He presented with "a short fuse" and increased tearfulness. He was put on Lexapro and found this very helpful. He continues to take Lexapro. He also had counseling with [facility] which he found helpful as well. He denies any history of suicidal ideation, during that period or any other time in his life. He does not have any history of psychosis.   Current mood is reportedly "great". He does have some anxiety and stress about the DBS procedure. However, he feels well informed and he is very trusting of the professionals involved. He also has a strong support system including his wife, his four children, a men's group, a church group  and his church choir. The only other stressor he or his wife identify is the fact that his mother in law, age 46, lives with them. She has lived with them for 16 years.   The patient is sleeping well, and wears his CPAP religiously. His appetite is good. He reports his Type 2 diabetes is pretty well controlled but he has had more low blood sugars in recent months and he is working wit his health care providers to determine what medication changes might need to be made.  He denies history of substance abuse or dependence. He does not use any recreational drugs. He is a former smoker, quit in the 1980s. He drinks minimal alcohol, maybe one beer once a month.    Social History: The patient was born and raised in [place]. He reports that he had learning difficulties in school, and recalls being a very slow reader as a child. He avoided reading as a child and adolescent. He is much better and quicker at reading now. He also notes he had significant difficulty learning a foreign language in college. He actually got tested and  he was found to have a learning disability for foreign languages. The school arranged for him to take Latin and have that suffice as a foreign language to meet his degree requirements. He also notes a history of learning difficulty with math. His wife wonders if he may have had ADHD but the patient does not think so. Ultimately he completed a bachelor's degree in education.  He served in [employer]. 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 He has been married twice. He is divorced from his first wife. He has 4 children from his first marriage. He is very close with his children. He has been married to his current wife for 25 years. They have 6 grandchildren total.    Medical History: Past Medical History:  Diagnosis Date  . Benign essential tremor   . Benign positional vertigo   . CVA (cerebral  vascular accident) ([place])    x2 - L retina, 1 right parietal  . Degenerative arthritis   . Diabetes mellitus   . Dyslipidemia   . GERD (gastroesophageal reflux disease)    hiatal hernia  . Gout   . H/O: vasectomy   . Hx of appendectomy   . Hx of tonsillectomy   . Hypertension   . 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 Melanoma ([place])   . Obesity   . OSA on CPAP    setting = 5  . Tremor, essential 06/22/2017     Current Medications:  Outpatient Encounter Medications as of 09/06/2018  Medication Sig  . amoxicillin-clavulanate (AUGMENTIN) 875-125 MG tablet Take 1 tablet by mouth 2 (two) times daily.  Marland Kitchen aspirin 81 MG tablet Take 1 tablet (81 mg total) by mouth daily.  Marland Kitchen atorvastatin (LIPITOR) 40 MG tablet Take 40 mg by mouth at bedtime.   . cephALEXin (KEFLEX) 500 MG capsule Take 1 capsule (500 mg total) by mouth 3 (three) times daily.  . cephALEXin (KEFLEX) 500 MG capsule Take 1 capsule (500 mg total) by mouth 3 (three) times daily.  Marland Kitchen docusate sodium (COLACE) 100 MG capsule Take 1 capsule (100 mg total) by mouth 2 (two) times daily.  Marland Kitchen escitalopram (LEXAPRO) 20 MG tablet Take 20 mg by mouth at bedtime.  . fenofibrate 54 MG tablet Take 54 mg by mouth daily.  . hydrochlorothiazide (HYDRODIURIL) 25 MG tablet Take 25 mg by mouth daily.  Marland Kitchen losartan (COZAAR) 100 MG tablet Take 100 mg by mouth daily.  . Lutein-Zeaxanthin 25-5 MG CAPS Take 1 tablet by mouth daily.  . methocarbamol (ROBAXIN) 500 MG tablet Take 1 tablet (500 mg total) by mouth every 6 (six) hours as needed for muscle spasms.  . Multiple Vitamins-Minerals (MULTIVITAMIN PO) Take 1 tablet by mouth daily.   . mupirocin ointment (BACTROBAN) 2 % Apply 1 application topically 2 (two) times daily.  . Omega-3 Fatty Acids (FISH OIL) 1000 MG CAPS Take 2,000 mg by mouth daily. Reported on 02/13/2016  . Polyethyl Glycol-Propyl Glycol (SYSTANE OP) Place 1-2 drops into both eyes 2 (two) times daily as needed (dry eyes).  .  polyethylene glycol (MIRALAX / GLYCOLAX) packet Take 17 g by mouth 2 (two) times daily.  . primidone (MYSOLINE) 50 MG tablet Take 1 tablet (50 mg total) by mouth 2 (two) times daily.  . silver sulfADIAZINE (SILVADENE) 1 % cream Apply 1 application topically daily.  Nelva Nay SOLOSTAR 300 UNIT/ML SOPN Inject 70 Units  into the skin 2 (two) times daily. Breakfast and supper   No facility-administered encounter medications on file as of 09/06/2018.      Behavioral Observations:   Appearance: Neatly, casually and appropriately dressed and groomed Gait: Ambulated independently, no gross abnormalities observed Speech: Fluent; normal rate, rhythm and volume. No significant word finding difficulty observed. Thought process: Linear, goal directed Affect: Full, euthymic, appropriate Interpersonal: Very pleasant, appropriate   55 minutes spent face-to-face with patient completing neurobehavioral status exam. 40 minutes spent integrating medical records/clinical data and completing this report. [id number] unit; [id number] unit.   TESTING: There is medical necessity to proceed with neuropsychological assessment as the results will be used to aid in differential diagnosis and clinical decision-making and to inform specific treatment recommendations. Additionally, there is medical necessity to proceed with pre-surgical neuropsychological evaluation to inform whether the patient might safely proceed with a medical or surgical procedure that may affect brain function (i.e., deep brain stimulation).   Clinical Decision Making: In considering the patient's current level of functioning, level of presumed impairment, nature of symptoms, emotional and behavioral responses during the interview, level of literacy, and observed level of motivation, a battery of tests was selected and communicated to the psychometrician.   Following the clinical interview/neurobehavioral status exam, the patient completed this full  battery of neuropsychological testing with my psychometrician under my supervision (see separate note).   PLAN: After testing and scoring are completed, I will addend a full report with results, clinical impressions and my recommendations. The patient will not return to see me but will follow up with Dr. Carles [name] instead. Evaluation ongoing; full report to follow.

## 2018-09-06 NOTE — Progress Notes (Signed)
   Neuropsychology Note  Joshua [name] completed 75 minutes of neuropsychological testing with technician, Joshua [name], BS, under the supervision of Dr. Macarthur Critchley, Licensed Psychologist. The patient did not appear overtly distressed by the testing session, per behavioral observation or via self-report to the technician. Rest breaks were offered.   Clinical Decision Making: In considering the patient's current level of functioning, level of presumed impairment, nature of symptoms, emotional and behavioral responses during the interview, level of literacy, and observed level of motivation/effort, a battery of tests was selected and communicated to the psychometrician.  Communication between the psychologist and technician was ongoing throughout the testing session and changes were made as deemed necessary based on patient performance on testing, technician observations and additional pertinent factors such as those listed above.  Joshua [name] will return within approximately 2 weeks for an interactive feedback session with Dr. Si Raider at which time his test performances, clinical impressions and treatment recommendations will be reviewed in detail. The patient understands he can contact our office should he require our assistance before this time.  35 minutes spent performing neuropsychological evaluation services/clinical decision making (psychologist). [CPT 20355] 75 minutes spent face-to-face with patient administering standardized tests, 30 minutes spent scoring (technician). [CPT Y8200648, 97416]  Full report to follow.

## 2018-09-07 ENCOUNTER — Telehealth: Payer: Self-pay | Admitting: Neurology

## 2018-09-07 ENCOUNTER — Encounter: Payer: Self-pay | Admitting: Psychology

## 2018-09-07 NOTE — Telephone Encounter (Signed)
Joshua [name]- does this pt need to be seen for a f/u by [place]? I was not sure and wanted you to check behind me. You saw this patient last, thank you!

## 2018-09-07 NOTE — Telephone Encounter (Signed)
Dr. Jannifer Franklin- please advise, thank you

## 2018-09-07 NOTE — Telephone Encounter (Signed)
The patient can be seen back through this office if he is not deemed to be an adequate candidate for deep brain stimulator therapy.

## 2018-09-07 NOTE — Telephone Encounter (Signed)
Pt called stating that he is following up with Dr. Carles Collet regularly. Pt unsure if he needs to see Dr. Jannifer Franklin while seeing Dr. Carles Collet please advise

## 2018-09-07 NOTE — Progress Notes (Signed)
NEUROPSYCHOLOGICAL EVALUATION   Name:    Joshua [name]  Date of Birth:   06/04/1951 Date of Interview:  09/06/2018 Date of Testing:  09/06/2018   Date of Report:  09/07/2018      Background Information:  Reason for Referral:  Joshua [name] is a 67 y.o. male referred by Dr. Wells Guiles Tat to assess hiscurrent level of cognitive functioning and assist in differential diagnosis.This was also part of a pre-surgical evaluation for deep brain stimulation (DBS) to assist in the management of essential tremor. The current evaluation consisted of a review of available medical records, an interview with the patient and his wife, and the completion of a neuropsychological testing battery. Informed consent was obtained.  History of Presenting Problem:  Joshua [name] has a history of essential tremor (bilateral UE), dating back approximately 27 years ago. It has worsened in the past 4-5 years. He has been followed by [facility] for tremor and vertigo. He was seen by Dr. Carles Collet on 07/23/2017 to discuss the possibility of DBS for management of his tremor. He was on Eliquis for postop DVT/PE at the time and advised to discuss stopping primidone with Dr. Jannifer Franklin. He followed up with Dr. Carles Collet on 12/28/2017 and reported continued interest in DBS procedure. Dr. Carles Collet stopped his primidone as he was still on Eliquis for postop DVT/PE. They did go ahead and get him set up for this neurocognitive evaluation as he wanted to continue to pursue the possibility of DBS. The patient followed up with [facility] on 04/08/2018. He had stopped Eliquis in early May 2019 and restarted his primidone 50 mg twice daily for tremors.   At today's visit (09/06/2018), the patient denies any significant cognitive concerns but does note that his father had Alzheimer's disease and "that is always in the recesses of my mind". His father died at age 82 and had experienced a very gradual decline in cognitive functioning  over many years prior to that. There is no other known family history of AD. The patient does have a strong family history of tremors.  The patient admits to some forgetfulness, and wonders if it is due to normal aging. He does NOT see signs in himself that are similar to how his father presented in his early stages of dementia. His wife does not have concerns about his cognitive functioning or memory. She feels as though any mild forgetfulness he experiences is normal for his age.  Upon direct questioning, the patient and his wife reported the following with regard to current cognitive functioning:   Forgetting recent conversations/events: Yes, per wife, for conversations (but she notes this seems to be due to him not paying attention in the first place); patient says this may happen but is not bothersome. No forgetting of recent events per both of them. Repeating statements/questions: No Misplacing/losing items: Yes, but this is lifelong (there has been no change) Forgetting appointments or other obligations: Yes, this has happened, but only when he over-commits or agrees to do something when he doesn't have his calendar with him. His wife notes that in the last couple of years he is doing better with keeping a calendar. Forgetting to take medications: No Difficulty concentrating: No, except for with tasks that require manual dexterity - he gets frustrated and gives up. 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 He manages instrumental ADLs without any cognitive problems. Specifically, he continues to drive, manage his medications and appointments, and do a lot of cooking. His wife has always done the finances and bill-paying.  Physically, his main complaint is tremor. His wife notes that he has been through many medical issues in the past, but the tremor is the one that bothers him most. He also has a history of vertigo which can be very debilitating. He has not had an episode for a while. He does feel a little unsteady or off balance at times. He has not had any falls. He has a history of knee replacement last year. A week later he was back in the hospital for PE.   He reports he has had two strokes in the past, "one in the brain and one in the back of my left eye". Medical records indicate he was hospitalized in May 2012 for vertigo symptoms and brain MRI revealed small acute infarction in the right parasagittal frontal white matter/corpus collosum. There was no hemorrhage or midline shift. The patient reports that he also had a stroke 7 years ago in the back of his left eye that caused a persisting blind spot in the middle of his field of vision in the left eye.  He reports a history of a couple of concussions when playing sports in school and in the [employer]. He did experience LOC but was never hospitalized or medically treated.   Psychiatric history is reportedly significant for depression after his stroke and in the midst of several psychosocial stressors. He presented with "a short fuse" and increased tearfulness. He was put on Lexapro and found this very helpful. He continues to take Lexapro. He also had counseling with [facility] which he found helpful as well. He denies any history of suicidal ideation, during that period or any other time in his life. He does not have any history of psychosis.   Current mood is reportedly "great". He does have some  anxiety and stress about the DBS procedure. However, he feels well informed and he is very trusting of the professionals involved. He also has a strong support system including his wife, his four children, a men's group, a church group and his church choir. The only other stressor he or his wife identify is the fact that his mother in law, age 49, lives with them. She has lived with them for 16 years.   The patient is sleeping well, and wears his CPAP religiously. His appetite is good. He reports his Type 2 diabetes is pretty well controlled but he has had more low blood sugars in recent months and he is working wit his health care providers to determine what medication changes might need to be made.  He denies history of substance abuse or dependence. He does not use any recreational drugs. He is a former smoker, quit in the 1980s. He drinks minimal alcohol, maybe one beer once a month.    Social History: The patient was born and raised in [place]. He reports that he had learning difficulties in school, and recalls being a very slow reader as  a child. He avoided reading as a child and adolescent. He is much better and quicker at reading now. He also notes he had significant difficulty learning a foreign language in college. He actually got tested and he was found to have a learning disability for foreign languages. The school arranged for him to take Latin and have that suffice as a foreign language to meet his degree requirements. He also notes a history of learning difficulty with math. His wife wonders if he may have had ADHD but the patient does not think so. Ultimately he completed a bachelor's degree in education.  He served in [employer]. 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 He has been married twice. He is divorced from his first wife. He has 4 children from his first marriage. He is very close with his  children. He has been married to his current wife for 25 years. They have 6 grandchildren total.    Medical History:  Past Medical History:  Diagnosis Date  . Benign essential tremor   . Benign positional vertigo   . CVA (cerebral vascular accident) ([place])    x2 - L retina, 1 right parietal  . Degenerative arthritis   . Diabetes mellitus   . Dyslipidemia   . GERD (gastroesophageal reflux disease)    hiatal hernia  . Gout   . H/O: vasectomy   . Hx of appendectomy   . Hx of tonsillectomy   . Hypertension   . 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 Melanoma ([place])   . Obesity   . OSA on CPAP    setting = 5  . Tremor, essential 06/22/2017    Current medications:  Outpatient Encounter Medications as of 09/06/2018  Medication Sig  . amoxicillin-clavulanate (AUGMENTIN) 875-125 MG tablet Take 1 tablet by mouth 2 (two) times daily.  Marland Kitchen aspirin 81 MG tablet Take 1 tablet (81 mg total) by mouth daily.  Marland Kitchen atorvastatin (LIPITOR) 40 MG tablet Take 40 mg by mouth at bedtime.   . cephALEXin (KEFLEX) 500 MG capsule Take 1 capsule (500 mg total) by mouth 3 (three) times daily.  . cephALEXin (KEFLEX) 500 MG capsule Take 1 capsule (500 mg total) by mouth 3 (three) times daily.  Marland Kitchen docusate sodium (COLACE) 100 MG capsule Take 1 capsule (100 mg total) by mouth 2 (two) times daily.  Marland Kitchen escitalopram (LEXAPRO) 20 MG tablet Take 20 mg by mouth at bedtime.  . fenofibrate 54 MG tablet Take 54 mg by mouth daily.  . hydrochlorothiazide (HYDRODIURIL) 25 MG tablet Take 25 mg by mouth daily.  Marland Kitchen losartan (COZAAR) 100 MG tablet Take 100 mg by mouth daily.  . Lutein-Zeaxanthin 25-5 MG CAPS Take 1 tablet by mouth daily.  . methocarbamol (ROBAXIN) 500 MG tablet Take 1 tablet (500 mg total) by mouth every 6 (six) hours as needed for muscle spasms.  . Multiple Vitamins-Minerals (MULTIVITAMIN PO) Take 1 tablet by mouth daily.   . mupirocin ointment (BACTROBAN) 2 % Apply 1 application topically 2 (two) times daily.   . Omega-3 Fatty Acids (FISH OIL) 1000 MG CAPS Take 2,000 mg by mouth daily. Reported on 02/13/2016  . Polyethyl Glycol-Propyl Glycol (SYSTANE OP) Place 1-2 drops into both eyes 2 (two) times daily as needed (dry eyes).  . polyethylene glycol (MIRALAX / GLYCOLAX) packet Take 17 g by mouth 2 (two) times daily.  . primidone (MYSOLINE) 50  MG tablet Take 1 tablet (50 mg total) by mouth 2 (two) times daily.  . silver sulfADIAZINE (SILVADENE) 1 % cream Apply 1 application topically daily.  Nelva Nay SOLOSTAR 300 UNIT/ML SOPN Inject 70 Units into the skin 2 (two) times daily. Breakfast and supper   No facility-administered encounter medications on file as of 09/06/2018.      Current Examination:  Behavioral Observations:  Appearance: Neatly, casually and appropriately dressed and groomed Gait: Ambulated independently, no gross abnormalities observed Speech: Fluent; normal rate, rhythm and volume. No significant word finding difficulty observed. Thought process: Linear, goal directed Affect: Full, euthymic, appropriate Interpersonal: Very pleasant, appropriate Orientation: Oriented to all spheres. Accurately named the current President and his predecessor.   Tests Administered: . Test of Premorbid Functioning (TOPF) . Wechsler Adult Intelligence Scale-Fourth Edition (WAIS-IV): Similarities, Music therapist, Coding and Digit Span subtests . Wechsler Memory Scale-Fourth Edition (WMS-IV) Older Adult Version (ages 12-90): Logical Memory I, II and Recognition subtests  . [employer] Verbal Learning Test - 2nd Edition (CVLT-2) Short Form . Repeatable Battery for the Assessment of Neuropsychological Status (RBANS) Form A:  Figure Copy and Recall subtests and Semantic Fluency subtest . Boston Naming Test (BNT) . Boston Diagnostic Aphasia Examination: Complex Ideational Material subtest . Controlled Oral Word Association Test (COWAT) . Trail Making Test A and B . Clock drawing test . Symbol Digit  Modalities Test (SDMT) - Oral only . Beck Depression Inventory -2nd Edition (BDI-II) . Generalized Anxiety Disorder - 7 item screener (GAD-7) . Parkinson's Disease Questionnaire (PDQ-39) - for tremor  Test Results: Note: Standardized scores are presented only for use by appropriately trained professionals and to allow for any future test-retest comparison. These scores should not be interpreted without consideration of all the information that is contained in the rest of the report. The most recent standardization samples from the test publisher or other sources were used whenever possible to derive standard scores; scores were corrected for age, gender, ethnicity and education when available.   Test Scores:  Test Name Raw Score Standardized Score Descriptor  TOPF 34/70 SS= 94 Average  WAIS-IV Subtests     Similarities 27/36 ss= 11 Average  Block Design 38/66 ss= 11 Average  Coding 35/135 ss= 6 Low average  Digit Span Forward 12/16 ss= 13 High average  Digit Span Backward 6/16 ss= 8 Average  WMS-IV Subtests     LM I 34/53 ss= 11 Average  LM II 24/39 ss= 12 High average  LM II Recognition 21/23 Cum %: >75 WNL  RBANS Subtests     Figure Copy 19/20 Z= 0.5 Average  Figure Recall 13/20 Z= -0.2 Average  Semantic Fluency 23 Z= 0.4 Average  CVLT-II Scores     Trial 1 5/9 Z= -0.5 Average  Trial 4 9/9 Z= 1.5 Superior  Trials 1-4 total 28/36 T= 57 High average  SD Free Recall 9/9 Z= 2.5 Very superior  LD Free Recall 6/9 Z= 0 Average  LD Cued Recall 8/9 Z= 1 High average  Recognition Discriminability 8/9 hits 0 false positives Z= 0.5 Average  Forced Choice Recognition 9/9  WNL  BNT 60/60 T= 64 Superior  BDAE Complex Ideational Material 11/12  WNL  COWAT-FAS 48 T= 55 Average  COWAT-Animals 27 T= 71 Very superior  Trail Making Test A  27" 0 errors T= 57 High average  Trail Making Test B  88" 0 errors T= 48 Average  Clock Drawing   WNL  SDMT - oral only 37/110 Z= -1.2 Low average  BDI-II 7/63  WNL  GAD-7 1/21  WNL  PDQ-39 (completed for tremor)     Mobility  25%    Activities of Daily Living 70.83%    Emotional Well Being 4.16%    Stigma 18.75%    Social Support 0    Cognitive Impairment 6.25%    Communication  0    Bodily Discomfort 16.66%        Description of Test Results:  Premorbid verbal intellectual abilities were estimated to have been within the average range based on a test of word reading.   Psychomotor processing speed was low average on a task requiring more fine motor control (drawing symbols) but high average on a task requiring less fine motor control (drawing lines connecting numbers). When the motor component was removed from the symbol coding task, performance remained in the low average range.   Auditory attention and working memory were high average to average, respectively.   Visual-spatial construction was average.   Language abilities assessed were intact. Specifically, confrontation naming was superior, and semantic verbal fluency ranged from average to very superior. Auditory comprehension of complex ideational material was intact.   With regard to verbal memory, encoding and acquisition of non-contextual information (i.e., word list) was high average. After a brief distracter task, free recall was very superior (9/9 items). After a delay, free recall was average (6/9 items). Cued recall was high average (8/9 items). Performance on a yes/no recognition task was average. On another verbal memory test, encoding and acquisition of contextual auditory information (i.e., short stories) was average. After a delay, free recall was high average. Performance on a yes/no recognition task was normal. With regard to non-verbal memory, delayed free recall of visual information was average.   Executive functioning was intact. Mental flexibility and set-shifting were average on Trails B. Verbal fluency with phonemic search restrictions was average.  Verbal abstract reasoning was average. Performance on a clock drawing task was normal.   On a self-report measure of mood, the patient's responses were not indicative of clinically significant depression at the present time. Symptoms endorsed included: feelings of failure, guilty feelings, self-criticalness, loss of energy, fatigue and reduced libido. He denied sadness or anhedonia. He denied suicidal ideation or intention. On a self-report measure of anxiety, the patient did not endorse any generalized anxiety at the present time. On a self-report measure assessing the impact of tremor on daily functioning and quality of life, he endorsed the most difficulty with ADLs (namely, inability to tie shoes, write clearly, cut up food, and hold a drink without spilling it). He endorsed mild difficulty with mobility but noted this is due to his knee replacement, not tremor. He endorsed mild stigma (e.g., embarrassment) associated with his tremor. He did not report any significant difficulties with emotional wellbeing, social support, communication, or cognitive function.   Clinical Impressions: No cognitive impairment appreciated on neurocognitive evaluation. No evidence of primary psychiatric disorder. Results of cognitive testing were entirely within normal limits and commensurate with estimated premorbid intellectual baseline. He did not demonstrate impaired performances on any aspect of testing. He demonstrated relative strengths in memory retrieval abilities, and semantic retrieval abilities (confrontation naming, semantic fluency). He demonstrated a mild relative weakness (with low average performances) on some tests of processing speed; reduced performances could be attributed to tremor and/or reduced fine motor control.  There is no evidence of neurocognitive disorder, including MCI or underlying dementia, at this time. There is also no evidence of significant emotional distress or primary  psychiatric  disorder. He is experiencing some mild, adjustment-related anxiety in anticipation of undergoing neurosurgery, and this is considered normal for the situation and not in need of any intervention.      IMPORTANT CONSIDERATIONS FOR DBS CANDIDACY   1. IS THE PATIENT EXPERIENCING COGNITIVE SYMPTOMS THAT FAR EXCEED WHAT IS EXPECTED FOR ET? No.   2. IS THERE A SEPARATE NEUROLOGICAL PROCESS AT WORK? There is no evidence to suggest a separate neurologic/neurocognitive process at work.   3. WERE ANY PSYCHOSOCIAL STRESSORS IDENTIFIED WITHIN THE INDIVIDUAL AND/OR FAMILY BEYOND ET THAT MAY IMPACT UPON POST-OPERATIVE ADJUSTMENT? No. There is mild chronic stress related to the patient's mother in law living in their home, but this is not a new situation and would not likely impact post-operative adjustment. Additionally, Joshua [name] appears to have good social support and coping skills. His report of mild anxiety in anticipation of neurosurgery is not unexpected and is considered normal for the circumstance.   4. CAN THIS PERSON COPE WITH THE STRESS OF SURGERY AND BE COMPLIANT AS AN AWAKE PARTICIPANT IN SURGERY? It is my impression that he would be able to tolerate all aspects of the DBS procedure.   5. CAN THIS PERSON PARTICIPATE IN THE MULTIPLE POST-OPERATIVE PROGRAMMING SESSIONS AND MEDICATION ADJUSTMENTS? Yes. Joshua [name] expressed good understanding for the post-operative requirements of DBS. He appears to have realistic expectations of the DBS procedure and good motivation for all aspects of participation. He appears to have a good level of social support from family and friends. He may benefit from pre-op contact with an individual who has been through the DBS surgery.  6. FROM A NEUROPSYCHOLOGICAL PERSPECTIVE, DOES THIS PERSON APPEAR TO BE A GOOD CANDIDATE FOR DBS? Yes.       Thank you for your referral of [place]. Please feel free to contact me if you have any questions or concerns regarding  this report.   Total time spent on this patient's case: 95 minutes for neurobehavioral status exam with psychologist (CPT code [phone], [phone]); 105 minutes of testing/scoring by psychometrician under psychologist's supervision (CPT codes [phone], [phone] units); 160 minutes for integration of patient data, interpretation of standardized test results and clinical data, clinical decision making, treatment planning and preparation of this report by psychologist (CPT codes [phone], [phone] units).

## 2018-09-07 NOTE — Telephone Encounter (Signed)
I saw this patient for a 1 time visit and recommended to f/u with Dr. Jannifer Franklin in October. Dr. Jannifer Franklin will have to make this call if he is okay with patient being followed by Dr. Carles Collet only. Thank you.

## 2018-09-08 ENCOUNTER — Telehealth: Payer: Self-pay | Admitting: Neurology

## 2018-09-08 NOTE — Telephone Encounter (Signed)
Looks like patient was scheduled for appt tomorrow.

## 2018-09-08 NOTE — Telephone Encounter (Signed)
Pt needs appt now that he has completed neurocognitive testing.  He can come in at 1 pm tomorrow if he is able.

## 2018-09-08 NOTE — Progress Notes (Signed)
Subjective:   Joshua [name] was seen in consultation in the movement disorder clinic at the request of [name], MD.  His PCP is [name], MD.  This patient is accompanied in the office by his wife who supplements the history.  The evaluation is for possible DBS for tremor.  The records that were made available to me were reviewed.  Tremor started approximately27 years ago.  It has gotten worse over the last 4 years  and involves the bilateral UE.  The R hand is worse than the L.  The mornings are worse than later in the day.    Tremor is most noticeable when he is going to hold something and when trying to approximate an object.   There is a family hx of tremor in his mother/brother/maternal uncle.  Pt started on primidone in about 2014, took it for about a year and was not sure that it was helpful.  He restarted it in Jan. 2018 at 25 mg bid.  He has tried weighted pens, which were somewhat helpful, but weighted utensils were not.  At the end of July, 2018, his primidone was increased to 50 mg bid.  He expressed interest in getting information on DBS therapy.  Affected by caffeine:  No. (drinks 1 cup coffee per day; drinks 1-2 large glasses tea per day) Affected by alcohol:  No. (none since May; prior 1-2 per week) Affected by stress:  No. Affected by fatigue:  No. Spills soup if on spoon:  Yes.   Spills glass of liquid if full:  Yes.   Affects ADL's (tying shoes, brushing teeth, etc):  Yes.   (shaves with blade with 2 hands)  Current/Previously tried tremor medications: primidone  Current medications that may exacerbate tremor:  n/a  Outside reports reviewed: historical medical records, lab reports and referral letter/letters.  The patient did have a left total knee replacement on May 7.  About a week later he ended up back in the hospital with DVT/PE.  He is on Lovenox for that.  69/4/19 update: Patient is seen back in follow-up, as consideration for possible DBS therapy for essential  tremor.  We held off on further exploration last visit because he had a DVT/PE in May and was on Lovenox.  I called PCP office and got notes from last office visit.  he is now on eliquis.  Patient reports today that the plan was changed and he needed to be on the eliquis for a year (until May).  He is still on primidone, 50 mg twice per day.  He has an appointment later this week with Dr. Jannifer Franklin at [facility] neurolog[facility] but thinks he has to cancel that.  It was originally supposed to be last week but [facility] changed the appt and now he cant make it b/c he is working.  09/09/18 update: Patient is seen today in follow-up for tremor.  Records are reviewed since last visit.  He is now off Eliquis.  He is back on primidone, 50 mg bid.  He does have neurocognitive testing with Dr. Si Raider.  There was no evidence of psychiatric diagnoses or dementia.  Patient desires to proceed with DBS.  "tremor is the one thing that I cannot adjust to."  He cannot button clothes.  He has trouble eating soup.  He cannot eat with a fork.  Both hands shake equally but he is R handed.  He has been seeing podiatry for a foot ulcer.  He is considering surgery  No Known Allergies  Outpatient Encounter Medications as of 09/09/2018  Medication Sig  . amoxicillin-clavulanate (AUGMENTIN) 875-125 MG tablet Take 1 tablet by mouth 2 (two) times daily.  Marland Kitchen aspirin 81 MG tablet Take 1 tablet (81 mg total) by mouth daily.  Marland Kitchen atorvastatin (LIPITOR) 40 MG tablet Take 40 mg by mouth at bedtime.   . cephALEXin (KEFLEX) 500 MG capsule Take 1 capsule (500 mg total) by mouth 3 (three) times daily.  . cephALEXin (KEFLEX) 500 MG capsule Take 1 capsule (500 mg total) by mouth 3 (three) times daily.  Marland Kitchen docusate sodium (COLACE) 100 MG capsule Take 1 capsule (100 mg total) by mouth 2 (two) times daily.  Marland Kitchen escitalopram (LEXAPRO) 20 MG tablet Take 20 mg by mouth at bedtime.  . fenofibrate 54 MG tablet Take 54 mg by mouth daily.  . hydrochlorothiazide  (HYDRODIURIL) 25 MG tablet Take 25 mg by mouth daily.  Marland Kitchen losartan (COZAAR) 100 MG tablet Take 100 mg by mouth daily.  . Lutein-Zeaxanthin 25-5 MG CAPS Take 1 tablet by mouth daily.  . methocarbamol (ROBAXIN) 500 MG tablet Take 1 tablet (500 mg total) by mouth every 6 (six) hours as needed for muscle spasms.  . Multiple Vitamins-Minerals (MULTIVITAMIN PO) Take 1 tablet by mouth daily.   . mupirocin ointment (BACTROBAN) 2 % Apply 1 application topically 2 (two) times daily.  . Omega-3 Fatty Acids (FISH OIL) 1000 MG CAPS Take 2,000 mg by mouth daily. Reported on 02/13/2016  . Polyethyl Glycol-Propyl Glycol (SYSTANE OP) Place 1-2 drops into both eyes 2 (two) times daily as needed (dry eyes).  . polyethylene glycol (MIRALAX / GLYCOLAX) packet Take 17 g by mouth 2 (two) times daily.  . primidone (MYSOLINE) 50 MG tablet Take 1 tablet (50 mg total) by mouth 2 (two) times daily.  . silver sulfADIAZINE (SILVADENE) 1 % cream Apply 1 application topically daily.  Nelva Nay SOLOSTAR 300 UNIT/ML SOPN Inject 70 Units into the skin 2 (two) times daily. Breakfast and supper   No facility-administered encounter medications on file as of 09/09/2018.     Past Medical History:  Diagnosis Date  . Benign essential tremor   . Benign positional vertigo   . CVA (cerebral vascular accident) ([place])    x2 - L retina, 1 right parietal  . Degenerative arthritis   . Diabetes mellitus   . Dyslipidemia   . GERD (gastroesophageal reflux disease)    hiatal hernia  . Gout   . H/O: vasectomy   . Hx of appendectomy   . Hx of tonsillectomy   . Hypertension   . 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 Melanoma ([place])   . Obesity   . OSA on CPAP    setting = 5  . Tremor, essential 06/22/2017    Past Surgical History:  Procedure Laterality Date  . APPENDECTOMY    . arthroscopic knee surgery Bilateral   . CATARACT EXTRACTION Bilateral   . NASAL SEPTUM SURGERY    . TONSILLECTOMY    . TOTAL KNEE ARTHROPLASTY Left  03/30/2017   Procedure: LEFT TOTAL KNEE ARTHROPLASTY;  Surgeon: Paralee Cancel, MD;  Location: [facility];  Service: Orthopedics;  Laterality: Left;    Social History   Socioeconomic History  . Marital status: Married    Spouse name: [name]  . Number of children: 2  . Years of education: Bachelors   . Highest education level: Not on file  Occupational History  . 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 Financial resource strain: Not on file  . Food insecurity:    Worry: Not on file    Inability: Not on file  . Transportation needs:    Medical: Not on file    Non-medical: Not on file  Tobacco Use  . Smoking status: Former Smoker    Packs/day: 1.00    Years: 10.00    Pack years: 10.00    Types: Cigarettes    Last attempt to quit: 11/25/1983    Years since quitting: 34.8  . Smokeless tobacco: Never Used  Substance and Sexual Activity  . Alcohol use: Yes    Alcohol/week: 2.0 standard drinks    Types: 2 Standard drinks or equivalent per week    Comment: occassionally  . Drug use: No  . Sexual activity: Not on file  Lifestyle  . Physical activity:    Days per week: Not on file    Minutes per session: Not on file  . Stress: Not on file  Relationships  . Social connections:    Talks on phone: Not on file    Gets together: Not on file    Attends religious service: Not on file    Active member of club or organization: Not on file    Attends meetings of clubs or organizations: Not on file    Relationship status: Not on file  . Intimate partner violence:    Fear of current or ex partner: Not on file    Emotionally abused: Not on file    Physically abused: Not on file    Forced sexual activity: Not on file  Other Topics Concern  . Not on file  Social History Narrative   Patient lives at home with wife. [name](   Patient has 2 children that are in good health.    Patient works for [employer]. Retired .    Patient has a Bachelors degree in History.     Family Status  Relation Name Status  . Mother  Deceased at age 39       Cerebral aneurysm  . Father  Deceased at age 29       Alzheimer's disease  . Brother  Alive  . Mat Uncle  (Not Specified)  . MGM  (Not Specified)  . Son 2 Alive  . Neg Hx  (Not Specified)    Review of Systems A complete 10 system ROS was obtained and was negative apart from what is mentioned.   Objective:   VITALS:   Vitals:   09/09/18 1300  BP: 140/80  Pulse: 82  Weight: 286 lb (129.7 kg)  Height: 6\' 4"  (1.93 m)   Gen:  Appears stated age and in NAD. HEENT:  Normocephalic, atraumatic. The mucous membranes are moist. The superficial temporal arteries are without ropiness or tenderness. Cardiovascular: Regular rate and rhythm. Lungs: Clear to auscultation bilaterally. Neck: There are no carotid bruits noted bilaterally.  NEUROLOGICAL:  Orientation:  The patient is alert and oriented x 3.   Cranial nerves: There is good facial symmetry.  Extraocular muscles are intact and visual fields are full to confrontational testing. Speech is fluent and clear. Soft palate rises symmetrically and there is no tongue deviation. Hearing is intact to conversational tone. Tone: Tone is good throughout. Sensation: Sensation is intact to light touch throughout. Coordination: He has slight difficulty with hand opening and closing bilaterally.  All other rapid alternating movements including finger  taps, toe taps, heel taps and alteration of supination/pronation of the forearm are normal. Motor: Strength is 5/5 in the bilateral upper and lower extremities.  Shoulder shrug is equal bilaterally.  There is no pronator drift.  There are no fasciculations noted. DTR's: Deep tendon reflexes are 1/4 at the bilateral biceps, triceps, brachioradialis, 0/4 at the bilateral patella and achilles.  Plantar responses are downgoing bilaterally. Gait and Station: The patient is able to ambulate  without difficulty. He is wide-based.  He has trouble ambulating in a tandem fashion.   MOVEMENT EXAM: Tremor: Patient has intermittent, albeit mild, rest tremor on the right.  He has little postural tremor, but moderate to severe intention tremor, especially on the right.  He has trouble with Archimedes spirals, right more than left.  He has trouble pouring water from one glass to another and spills it everywhere.  Labs: I reviewed lab work from his primary care physician dated October 08, 2017.  Sodium was 140, potassium 4.7, chloride 102, CO2 28, BUN 26 and creatinine 1.5.  Glucose was elevated at 219.  White blood cells were 10.2, hemoglobin 13.7, hematocrit 40.6 and platelets 270.     Assessment/Plan:   1.  Essential Tremor, overall moderate  -He is on primidone, 50 mg twice per day.  -I talked to the patient about the logistics associated with DBS therapy.  I talked to the patient about risks/benefits/side effects of DBS therapy.  We talked about risks which included but were not limited to infection, paralysis, intraoperative seizure, death, stroke, bleeding around the electrode.   I talked to patient about fiducial placement 1 week prior to DBS therapy.  I talked to the patient about what to expect in the operating room, including the fact that this is an awake surgery.  We talked about battery placement as well as which is done under general anesthesia, generally approximately one week following the initial surgery.  We also talked about the fact that the patient will need to be off of medications for surgery.  We discussed various manufacturers of the device.  He would like the [employer] scientific device.  We talked about unilateral vs bilateral and risks and benefits of each.  He would like bilateral.  The patient and family were given the opportunity to ask questions, which they did, and I answered them to the best of my ability today.    -Will refer for preop MRI  -Will refer to Dr.  Vertell Limber.   2.  Peripheral neuropathy  -The patient has clinical examination evidence of a diffuse peripheral neuropathy, which certainly can affect gait and balance.  We discussed safety associated with peripheral neuropathy.  His is likely due to DM.  We discussed the importance of controlling the diabetes, especially if we are to consider deep brain stimulation surgery.  3.  Foot ulcer  -Discussed importance of having that cleared up before DBS surgery.  He thinks he is going to have what sounds like a debridement surgery soon.  4.  We will see him back after the above has been completed for last-minute questions and preop video.  Much greater than 50% of this visit was spent in counseling and coordinating care.  Total face to face time:  60 min    CC:  [name], MD

## 2018-09-09 ENCOUNTER — Telehealth: Payer: Self-pay | Admitting: Neurology

## 2018-09-09 ENCOUNTER — Encounter: Payer: Self-pay | Admitting: Neurology

## 2018-09-09 ENCOUNTER — Ambulatory Visit (INDEPENDENT_AMBULATORY_CARE_PROVIDER_SITE_OTHER): Payer: Medicare Other | Admitting: Neurology

## 2018-09-09 VITALS — BP 140/80 | HR 82 | Ht 76.0 in | Wt 286.0 lb

## 2018-09-09 DIAGNOSIS — G250 Essential tremor: Secondary | ICD-10-CM

## 2018-09-09 DIAGNOSIS — Z01818 Encounter for other preprocedural examination: Secondary | ICD-10-CM | POA: Diagnosis not present

## 2018-09-09 NOTE — Telephone Encounter (Signed)
Left message on machine for patient to call back.  Pre DBS MR scheduled for 09/17/18 to arrive at 11am for [facility].  Awaiting call back to make him aware.

## 2018-09-09 NOTE — Telephone Encounter (Signed)
Patient's wife called back and made aware of appt date/time.   Referral faxed to [facility] at [phone] with confirmation received. They will contact the patient to schedule.

## 2018-09-10 ENCOUNTER — Ambulatory Visit: Payer: Medicare Other | Admitting: Neurology

## 2018-09-13 ENCOUNTER — Ambulatory Visit: Payer: Medicare Other | Admitting: Podiatry

## 2018-09-13 ENCOUNTER — Telehealth: Payer: Self-pay | Admitting: Neurology

## 2018-09-13 ENCOUNTER — Encounter: Payer: Self-pay | Admitting: Podiatry

## 2018-09-13 DIAGNOSIS — L97522 Non-pressure chronic ulcer of other part of left foot with fat layer exposed: Secondary | ICD-10-CM | POA: Diagnosis not present

## 2018-09-13 DIAGNOSIS — L97529 Non-pressure chronic ulcer of other part of left foot with unspecified severity: Secondary | ICD-10-CM

## 2018-09-13 DIAGNOSIS — M216X9 Other acquired deformities of unspecified foot: Secondary | ICD-10-CM | POA: Diagnosis not present

## 2018-09-13 DIAGNOSIS — E11621 Type 2 diabetes mellitus with foot ulcer: Secondary | ICD-10-CM

## 2018-09-13 NOTE — Telephone Encounter (Signed)
I think that he is confused.  I said Dr. Vertell Limber.  Dr. Maryjean Ka doesn't do surgery (he is pain management) and Dr Vertell Limber is the only one that does DBS surgery.

## 2018-09-13 NOTE — Telephone Encounter (Signed)
Patient was wondering why he was referred to Dr.Stern when he was told to the referral was to go to Dr.Harkins. He just didn't know if that was correct. Please call him back at [phone]. Thanks!

## 2018-09-14 NOTE — Telephone Encounter (Signed)
Left message informing patient that it is Dr. Vertell Limber who we are referring to.

## 2018-09-14 NOTE — Telephone Encounter (Signed)
Caryl Pina will call.

## 2018-09-15 NOTE — Progress Notes (Signed)
Subjective: 67 year old male presents the office today for follow-up evaluation of a wound to the bottom of his left foot.  He states that overall he is doing better but he still notices bleeding coming from the wound.  Denies any drainage or pus or any swelling or redness.  He is to be undergoing deep brain stimulation and February and is asking if we can do surgery prior to that.  This is been ongoing for some time and callus with any definitive resolution. Denies any systemic complaints such as fevers, chills, nausea, vomiting. No acute changes since last appointment, and no other complaints at this time.   Objective: AAO x3, NAD DP/PT pulses palpable bilaterally, CRT less than 3 seconds Ulceration left foot submetatarsal 4.  Upon debridement the wound measures approximately 0.5 x 0.4 x 0.2 cm and overall has a healing in appearance.  Granular wound base.  There is no probing, undermining or tunneling.  There is no swelling erythema, edema, ascending cellulitis.  There is no fluctuation or crepitation or any malodor. No open lesions or pre-ulcerative lesions.  No pain with calf compression, swelling, warmth, erythema  Assessment: 67 year old male chronic wound left foot  Plan: -All treatment options discussed with the patient including all alternatives, risks, complications.  -I debrided the wound without any complications or bleeding utilizing #312 blade scalpel down to bleeding, healthy, granular tissue.  Continue offloading at all times.  Continue with daily dressing changes -Ultimately discussed surgical intervention.  Discussed an osteotomy of the fourth metatarsal help take pressure off the area.  Understanding this is not a guarantee and it can cause other issues.  Will consider this next appointment.  He did bring his wife in next appointment to discuss as well. -Patient encouraged to call the office with any questions, concerns, change in symptoms.   Trula Slade DPM

## 2018-09-17 ENCOUNTER — Ambulatory Visit (HOSPITAL_COMMUNITY)
Admission: RE | Admit: 2018-09-17 | Discharge: 2018-09-17 | Disposition: A | Discharge status: Home / Self Care | Payer: Medicare Other | Source: Ambulatory Visit | Attending: Neurology | Admitting: Neurology

## 2018-09-17 DIAGNOSIS — G250 Essential tremor: Secondary | ICD-10-CM

## 2018-09-17 DIAGNOSIS — Z01818 Encounter for other preprocedural examination: Secondary | ICD-10-CM

## 2018-09-17 LAB — CREATININE, SERUM
Creatinine, Ser: 1.49 mg/dL — ABNORMAL HIGH (ref 0.61–1.24)
GFR calc Af Amer: 53 mL/min — ABNORMAL LOW (ref >60)
GFR calc non Af Amer: 46 mL/min — ABNORMAL LOW (ref >60)

## 2018-09-17 MED ORDER — GADOBUTROL 1 MMOL/ML IV SOLN
10.0000 mL | Freq: Once | INTRAVENOUS | Status: AC | PRN
  Administered 2018-09-17: 10 mL via INTRAVENOUS

## 2018-09-17 NOTE — Progress Notes (Signed)
Creatine sent to lab.

## 2018-09-21 ENCOUNTER — Telehealth: Payer: Self-pay | Admitting: Neurology

## 2018-09-21 NOTE — Telephone Encounter (Signed)
Patient left a voicemail that he was returning your phone call. Please call him back at [phone]. Thanks!

## 2018-09-21 NOTE — Telephone Encounter (Signed)
Left message on machine for patient to call back.  To let him know MR okay for DBS planning. Awaiting call back to see if appt is scheduled with Dr. Vertell Limber and to make follow up with Dr. Carles Collet and video appt.

## 2018-09-21 NOTE — Telephone Encounter (Signed)
Patient aware. He has appt with Dr. Vertell Limber on 11/10/18. Appt made with Dr. Carles Collet on 11/29/17 for follow up and video.

## 2018-09-21 NOTE — Telephone Encounter (Signed)
-----   Message from [place], DO sent at 09/21/2018  7:49 AM EDT ----- Let pt know that I reviewed his images.  They are adequate for DBS planning.  He does have some old small strokes.

## 2018-09-23 ENCOUNTER — Encounter: Payer: Medicare Other | Admitting: Psychology

## 2018-09-23 ENCOUNTER — Encounter

## 2018-09-27 ENCOUNTER — Ambulatory Visit: Payer: Medicare Other | Admitting: Podiatry

## 2018-09-27 DIAGNOSIS — L97522 Non-pressure chronic ulcer of other part of left foot with fat layer exposed: Secondary | ICD-10-CM

## 2018-09-27 DIAGNOSIS — M216X9 Other acquired deformities of unspecified foot: Secondary | ICD-10-CM | POA: Diagnosis not present

## 2018-09-27 DIAGNOSIS — L97529 Non-pressure chronic ulcer of other part of left foot with unspecified severity: Secondary | ICD-10-CM

## 2018-09-27 DIAGNOSIS — E11621 Type 2 diabetes mellitus with foot ulcer: Secondary | ICD-10-CM

## 2018-09-27 NOTE — Patient Instructions (Signed)
Pre-Operative Instructions  Congratulations, you have decided to take an important step towards improving your quality of life.  You can be assured that the doctors and staff at [facility] will be with you every step of the way.  Here are some important things you should know:  1. Plan to be at the surgery center/hospital at least 1 (one) hour prior to your scheduled time, unless otherwise directed by the surgical center/hospital staff.  You must have a responsible adult accompany you, remain during the surgery and drive you home.  Make sure you have directions to the surgical center/hospital to ensure you arrive on time. 2. If you are having surgery at [facility] or [facility], you will need a copy of your medical history and physical form from your family physician within one month prior to the date of surgery. We will give you a form for your primary physician to complete.  3. We make every effort to accommodate the date you request for surgery.  However, there are times where surgery dates or times have to be moved.  We will contact you as soon as possible if a change in schedule is required.   4. No aspirin/ibuprofen for one week before surgery.  If you are on aspirin, any non-steroidal anti-inflammatory medications (Mobic, Aleve, Ibuprofen) should not be taken seven (7) days prior to your surgery.  You make take Tylenol for pain prior to surgery.  5. Medications - If you are taking daily heart and blood pressure medications, seizure, reflux, allergy, asthma, anxiety, pain or diabetes medications, make sure you notify the surgery center/hospital before the day of surgery so they can tell you which medications you should take or avoid the day of surgery. 6. No food or drink after midnight the night before surgery unless directed otherwise by surgical center/hospital staff. 7. No alcoholic beverages 24-hours prior to surgery.  No smoking 24-hours prior or 24-hours after  surgery. 8. Wear loose pants or shorts. They should be loose enough to fit over bandages, boots, and casts. 9. Don't wear slip-on shoes. Sneakers are preferred. 10. Bring your boot with you to the surgery center/hospital.  Also bring crutches or a walker if your physician has prescribed it for you.  If you do not have this equipment, it will be provided for you after surgery. 11. If you have not been contacted by the surgery center/hospital by the day before your surgery, call to confirm the date and time of your surgery. 12. Leave-time from work may vary depending on the type of surgery you have.  Appropriate arrangements should be made prior to surgery with your employer. 13. Prescriptions will be provided immediately following surgery by your doctor.  Fill these as soon as possible after surgery and take the medication as directed. Pain medications will not be refilled on weekends and must be approved by the doctor. 14. Remove nail polish on the operative foot and avoid getting pedicures prior to surgery. 15. Wash the night before surgery.  The night before surgery wash the foot and leg well with water and the antibacterial soap provided. Be sure to pay special attention to beneath the toenails and in between the toes.  Wash for at least three (3) minutes. Rinse thoroughly with water and dry well with a towel.  Perform this wash unless told not to do so by your physician.  Enclosed: 1 Ice pack (please put in freezer the night before surgery)   1 Hibiclens skin cleaner     Pre-op instructions  If you have any questions regarding the instructions, please do not hesitate to call our office.  [place]: [address], [place], [place] [address] -- [phone]  [place]: [address] [place], [place] [address] -- [phone]  [place]: [address]  [place], [place] [address] -- [phone]  [place]: [address], [address], [place], [place] [address] -- [phone]  Website:  [email or web address] 

## 2018-09-28 NOTE — Progress Notes (Signed)
Subjective: 67 year old male presents the office today for follow-up evaluation of a wound to the bottom of his left foot.  He also presents today for surgical consultation.  He has had a chronic wound and callus here for some time and the wound was closed he gets a callus but does open back up.  We have tried different offloading devices as well as diabetic shoes and inserts is not significantly limiting the symptoms.  He is continue with daily dressing changes to the wound he feels overall is getting better but not healed.  He denies any pus coming from the area only with some bloody drainage but he and his wife noticed.  No increase in swelling or redness of the foot no red streaks.  He has no other concerns.  Denies any systemic complaints such as fevers, chills, nausea, vomiting. No acute changes since last appointment, and no other complaints at this time.   Objective: AAO x3, NAD DP/PT pulses palpable bilaterally, CRT less than 3 seconds Ulceration left foot submetatarsal 4.  Upon debridement the wound measures approximately 0.4 x 0.4 x 0.2 cm and overall has a healing in appearance.  Granular wound base.  There is no probing, undermining or tunneling.  There is no swelling erythema, edema, ascending cellulitis.  There is no fluctuation or crepitation or any malodor. No open lesions or pre-ulcerative lesions.  No pain with calf compression, swelling, warmth, erythema  Assessment: 67 year old male chronic wound left foot  Plan: -All treatment options discussed with the patient including all alternatives, risks, complications.  -I sharply debrided the wound without any complications or bleeding utilizing #312 scalpel down to healthy, bleeding, granular tissue.  There is no clinical signs of infection.  Continue with daily dressing changes and for now we will continue Silvadene. -Ultimately we discussed surgical intervention to help limit pressure.  Discussed various options.  Given the open  wound that has included hardware in the foot.  We discussed the plantar condylectomy.  We discussed the surgery as well as postoperative course and he wished to proceed with this.  We will plan to his November 20. -The incision placement as well as the postoperative course was discussed with the patient. I discussed risks of the surgery which include, but not limited to, infection, bleeding, pain, swelling, need for further surgery, delayed or nonhealing, painful or ugly scar, numbness or sensation changes, over/under correction, recurrence, transfer lesions, further deformity, hardware failure, DVT/PE, loss of toe/foot. Patient understands these risks and wishes to proceed with surgery. The surgical consent was reviewed with the patient all 3 pages were signed. No promises or guarantees were given to the outcome of the procedure. All questions were answered to the best of my ability. Before the surgery the patient was encouraged to call the office if there is any further questions. The surgery will be performed at the [facility] on an outpatient basis. -I will see him back in a day before surgery to evaluate the wound.  Trula Slade DPM

## 2018-10-12 ENCOUNTER — Ambulatory Visit: Payer: Medicare Other | Admitting: Podiatry

## 2018-10-12 ENCOUNTER — Encounter: Payer: Self-pay | Admitting: Podiatry

## 2018-10-12 VITALS — Temp 98.7°F

## 2018-10-12 DIAGNOSIS — E11621 Type 2 diabetes mellitus with foot ulcer: Secondary | ICD-10-CM | POA: Diagnosis not present

## 2018-10-12 DIAGNOSIS — L97529 Non-pressure chronic ulcer of other part of left foot with unspecified severity: Secondary | ICD-10-CM

## 2018-10-12 DIAGNOSIS — L97522 Non-pressure chronic ulcer of other part of left foot with fat layer exposed: Secondary | ICD-10-CM

## 2018-10-12 MED ORDER — AMOXICILLIN-POT CLAVULANATE 875-125 MG PO TABS: 1.0000 | ORAL_TABLET | Freq: Two times a day (BID) | ORAL | 0 refills | Status: DC

## 2018-10-12 MED ORDER — PROMETHAZINE HCL 25 MG PO TABS: 25.0000 mg | ORAL_TABLET | Freq: Three times a day (TID) | ORAL | 0 refills | Status: DC | PRN

## 2018-10-12 MED ORDER — HYDROCODONE-ACETAMINOPHEN 5-325 MG PO TABS: 1.0000 | ORAL_TABLET | Freq: Four times a day (QID) | ORAL | 0 refills | Status: DC | PRN

## 2018-10-13 ENCOUNTER — Encounter: Payer: Self-pay | Admitting: Podiatry

## 2018-10-13 DIAGNOSIS — M21542 Acquired clubfoot, left foot: Secondary | ICD-10-CM | POA: Diagnosis not present

## 2018-10-14 NOTE — Progress Notes (Signed)
Subjective: 67 year old male presents the office today for follow-up evaluation of a wound to the bottom of his left foot.  He is scheduled for surgery tomorrow for condylectomy of the left fourth metatarsal.  He states that he just recently came back from vacation and he tries to have his foot which is a good.  Denies any drainage or pus coming from the area.  Denies any increase in swelling or redness to his feet.  Otherwise she states he is doing well he has no fevers, chills, nausea, vomiting.  He has no other concerns today.   Objective: AAO x3, NAD DP/PT pulses palpable bilaterally, CRT less than 3 seconds Ulceration left foot submetatarsal 4.  Upon debridement the wound measures approximately 0.4 x 0.4 x 0.2 cm and overall is about the same.  There is a granular wound base.  Mild hyperkeratotic periwound and some fibrotic tissue within the base of the wound prior to debridement as well.  There is no probing to bone, undermining or tunneling.  There is no fluctuation or crepitation or any malodor.  There is prominence of the metatarsal plantarly.  No open lesions or pre-ulcerative lesions.  No pain with calf compression, swelling, warmth, erythema  Assessment: 67 year old male chronic wound left foot  Plan: -All treatment options discussed with the patient including all alternatives, risks, complications.  -Today I sharply debrided the wound without any complications down to healthy, granular tissue.  No signs of an abscess or infection.  Dressing was applied today.  He is scheduled for surgery tomorrow again discussed the surgery as well as postoperative course.  He has no further questions or concerns.  I sent his postoperative medications to the pharmacy.  We will plan on surgery as scheduled tomorrow.  Trula Slade DPM

## 2018-10-15 ENCOUNTER — Ambulatory Visit (INDEPENDENT_AMBULATORY_CARE_PROVIDER_SITE_OTHER): Payer: Medicare Other | Admitting: Podiatry

## 2018-10-15 ENCOUNTER — Ambulatory Visit (INDEPENDENT_AMBULATORY_CARE_PROVIDER_SITE_OTHER): Payer: Medicare Other

## 2018-10-15 VITALS — BP 130/88 | Temp 98.5°F

## 2018-10-15 DIAGNOSIS — L97529 Non-pressure chronic ulcer of other part of left foot with unspecified severity: Secondary | ICD-10-CM

## 2018-10-15 DIAGNOSIS — E11621 Type 2 diabetes mellitus with foot ulcer: Secondary | ICD-10-CM

## 2018-10-15 DIAGNOSIS — L97522 Non-pressure chronic ulcer of other part of left foot with fat layer exposed: Secondary | ICD-10-CM

## 2018-10-15 NOTE — Progress Notes (Signed)
Subjective: Joshua [name] is a 67 y.o. is seen today in office s/p left foot fourth metatarsal condylectomy preformed on 10/13/2018. They state their pain is controlled.  He has been trying to stay off of his foot as much possible.  Is been taking the antibiotics.  Denies any systemic complaints such as fevers, chills, nausea, vomiting. No calf pain, chest pain, shortness of breath.   Objective: General: No acute distress, AAOx3  DP/PT pulses palpable 2/4, CRT < 3 sec to all digits.  LEFT foot: Incision is well coapted without any evidence of dehiscence and sutures are intact. There is no surrounding erythema, ascending cellulitis, fluctuance, crepitus, malodor, drainage/purulence. There is minimal edema around the surgical site. There is no pain along the surgical site.  Wound left foot the metatarsal for superficial with a granular wound base measuring 1 x 0.6 cm after debridement.  There is no drainage or pus there is no fluctuation or crepitation.  No ascending cellulitis or surrounding erythema. No other areas of tenderness to bilateral lower extremities.  No other open lesions or pre-ulcerative lesions.  No pain with calf compression, swelling, warmth, erythema.   Assessment and Plan:  Status post left foot fourth metatarsal condylectomy doing well with no complications   -Treatment options discussed including all alternatives, risks, and complications -X-rays were obtained reviewed.  No definitive evidence of acute fracture identified today. -Overall is doing better.  Did sharply debride the wound without any complications or bleeding.  Betadine was painted over the wound as well as the incision followed by dry sterile dressing. -Remain nonweightbearing as much as possible.  Continue cam boot. -Ice/elevation -Pain medication as needed. -Monitor for any clinical signs or symptoms of infection and DVT/PE and directed to call the office immediately should any occur or go to the  ER. -Follow-up on Tuesday for dressing change or sooner if any problems arise. In the meantime, encouraged to call the office with any questions, concerns, change in symptoms.   Celesta Gentile, DPM

## 2018-10-18 ENCOUNTER — Encounter: Payer: Medicare Other | Admitting: Podiatry

## 2018-10-20 ENCOUNTER — Ambulatory Visit (INDEPENDENT_AMBULATORY_CARE_PROVIDER_SITE_OTHER): Payer: Self-pay

## 2018-10-20 ENCOUNTER — Ambulatory Visit: Payer: Medicare Other | Admitting: Registered"

## 2018-10-20 DIAGNOSIS — L97522 Non-pressure chronic ulcer of other part of left foot with fat layer exposed: Secondary | ICD-10-CM

## 2018-10-20 DIAGNOSIS — M216X9 Other acquired deformities of unspecified foot: Secondary | ICD-10-CM

## 2018-10-20 DIAGNOSIS — Z09 Encounter for follow-up examination after completed treatment for conditions other than malignant neoplasm: Secondary | ICD-10-CM

## 2018-10-25 ENCOUNTER — Ambulatory Visit (INDEPENDENT_AMBULATORY_CARE_PROVIDER_SITE_OTHER): Payer: Self-pay | Admitting: Podiatry

## 2018-10-25 DIAGNOSIS — M216X9 Other acquired deformities of unspecified foot: Secondary | ICD-10-CM

## 2018-10-25 DIAGNOSIS — Z09 Encounter for follow-up examination after completed treatment for conditions other than malignant neoplasm: Secondary | ICD-10-CM

## 2018-10-25 NOTE — Progress Notes (Signed)
Patient is here today for follow-up appointment and dressing change, recent procedure performed on 10/13/2018, the fourth metatarsal condylectomy.  He states that his foot feels good he has not had any pain medications recently and overall is feeling well.  Left foot: Incision is well coapted, no redness, no erythema, all sutures are intact no evidence of dehiscence.  There is minimal edema around surgical site, this has improved since last week.  Wound on plantar aspect of left foot remains superficial with granular base.  Area is free from signs and symptoms of infection, no redness, no erythema, no drainage at this time.  Vital signs stable, patient is afebrile.      Wound on the plantar aspect of left foot was cleansed with normal saline, Betadine was applied to wound and to surgical area along with dry sterile dressing.  Patient was advised to remain in cam boot and minimize weightbearing.  He is to keep the area dry, follow-up next week for regular scheduled postop visit.  He is to follow-up sooner with any acute symptom changes

## 2018-10-27 ENCOUNTER — Encounter: Payer: Self-pay | Admitting: Skilled Nursing Facility1

## 2018-10-27 ENCOUNTER — Encounter: Payer: Medicare Other | Attending: Internal Medicine | Admitting: Skilled Nursing Facility1

## 2018-10-27 DIAGNOSIS — E1169 Type 2 diabetes mellitus with other specified complication: Secondary | ICD-10-CM

## 2018-10-27 DIAGNOSIS — E669 Obesity, unspecified: Secondary | ICD-10-CM

## 2018-10-27 DIAGNOSIS — E1121 Type 2 diabetes mellitus with diabetic nephropathy: Secondary | ICD-10-CM | POA: Insufficient documentation

## 2018-10-27 NOTE — Progress Notes (Signed)
Diabetes Self-Management Education  Visit Type: Follow-up  Appt. Start Time: 830 Appt. End Time: 1000  10/27/2018  [name], identified by name and date of birth, is a 67 y.o. male with a diagnosis of Diabetes  .   ASSESSMENT  Pt arrives wearing a boot due to a recent  foot surgery. Pt states he is burned out and tired of fighting diabetes.  Pt staets he will be getting a Deep brain needs the A1C to be below 7.  Pt states he was 360 pounds down to 270 pounds.  Pt states he tries to track CHO and sugars. Pt states if his bloods sugars are in the 80's he feels very funky and it lasts for a long time feeling washed out.  Pt states he feels he has to have a snack before bed due to fear of having a low blood sugar. 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  Pt state she has the habit of snacking while reading.   Pt states he'll work on identifying what snacks cause which fasting blood sugars.  Height 6\' 4"  (1.93 m). Body mass index is 34.81 kg/m.  Diabetes Self-Management Education - 10/27/18 0851      Visit Information   Visit Type  Follow-up      Health Coping   How would you rate your overall health?  Good      Psychosocial Assessment   Patient Belief/Attitude about Diabetes  Defeat/Burnout    Self-management support  Family;Doctor's office    Patient Concerns  Nutrition/Meal planning;Healthy Lifestyle;Glycemic Control      Pre-Education Assessment   Patient understands the diabetes disease and treatment process.  Needs Instruction    Patient understands incorporating nutritional management into lifestyle.  Needs Instruction    Patient undertands incorporating physical activity into  lifestyle.  Needs Instruction    Patient understands using medications safely.  Needs Instruction    Patient understands monitoring blood glucose, interpreting and using results  Needs Instruction    Patient understands prevention, detection, and treatment of acute complications.  Needs Instruction    Patient understands prevention, detection, and treatment of chronic complications.  Needs Instruction    Patient understands how to develop strategies to address psychosocial issues.  Needs Instruction    Patient understands how to develop strategies to promote health/change behavior.  Needs Instruction      Complications   Last HgB A1C per patient/outside source  7.8 %    How often do you check your blood sugar?  1-2 times/day    Fasting Blood glucose range (mg/dL)  70-129;130-179    Postprandial Blood glucose range (mg/dL)  70-129;130-179    Number of hypoglycemic episodes per month  0    Have you had a dilated eye exam in the past 12 months?  Yes    Have you had a dental exam in the past 12 months?  Yes    Are you checking your feet?  Yes    How many days per week are you checking your feet?  7      Dietary Intake   Breakfast  boiled egg and fruit or oatmeal or banana and 10-12 grapes  Snack (morning)  trailmix     Lunch  chicken salad or fruit or sandwich    Snack (afternoon)  nuts or fruit     Dinner  3-4 slices of bread with meat or meat and potatoes     Snack (evening)  2 slices of turley meat and grapes 17-20    Beverage(s)  1 gallon water, half and half tea, unsweet tea, coffee, 1 beer      Exercise   Exercise Type  ADL's    How many days per week to you exercise?  0    How many minutes per day do you exercise?  0    Total minutes per week of exercise  0      Patient Education   Previous Diabetes Education  No    Disease state   Explored patient's options for treatment of their diabetes;Definition of diabetes, type 1 and 2, and the diagnosis of diabetes;Factors that  contribute to the development of diabetes    Nutrition management   Meal timing in regards to the patients' current diabetes medication.;Carbohydrate counting;Role of diet in the treatment of diabetes and the relationship between the three main macronutrients and blood glucose level;Food label reading, portion sizes and measuring food.;Reviewed blood glucose goals for pre and post meals and how to evaluate the patients' food intake on their blood glucose level.;Effects of alcohol on blood glucose and safety factors with consumption of alcohol.;Meal options for control of blood glucose level and chronic complications.;Information on hints to eating out and maintain blood glucose control.    Physical activity and exercise   Role of exercise on diabetes management, blood pressure control and cardiac health.;Helped patient identify appropriate exercises in relation to his/her diabetes, diabetes complications and other health issue.;Identified with patient nutritional and/or medication changes necessary with exercise.    Monitoring  Taught/evaluated SMBG meter.;Purpose and frequency of SMBG.    Acute complications  Taught treatment of hypoglycemia - the 15 rule.;Discussed and identified patients' treatment of hyperglycemia.    Chronic complications  Relationship between chronic complications and blood glucose control    Psychosocial adjustment  Role of stress on diabetes;Worked with patient to identify barriers to care and solutions;Helped patient identify a support system for diabetes management;Identified and addressed patients feelings and concerns about diabetes;Brainstormed with patient on coping mechanisms for social situations, getting support from significant others, dealing with feelings about diabetes    Personal strategies to promote health  Lifestyle issues that need to be addressed for better diabetes care;Helped patient develop diabetes management plan for (enter comment)      Individualized Goals  (developed by patient)   Nutrition  Follow meal plan discussed;General guidelines for healthy choices and portions discussed;Adjust meds/carbs with exercise as discussed    Physical Activity  Exercise 5-7 days per week;30 minutes per day    Medications  take my medication as prescribed      Patient Self-Evaluation of Goals - Patient rates self as meeting previously set goals (% of time)   Nutrition  Not Applicable    Physical Activity  Not Applicable    Medications  Not Applicable    Monitoring  Not Applicable    Problem Solving  Not Applicable    Reducing Risk  Not Applicable    Health Coping  Not Applicable      Post-Education Assessment   Patient understands the diabetes disease and treatment process.  Demonstrates understanding / competency    Patient understands incorporating nutritional management into lifestyle.  Demonstrates understanding / competency    Patient undertands incorporating physical activity into lifestyle.  Demonstrates understanding / competency    Patient understands using medications safely.  Demonstrates understanding / competency    Patient understands monitoring blood glucose, interpreting and using results  Demonstrates understanding / competency    Patient understands prevention, detection, and treatment of acute complications.  Demonstrates understanding / competency    Patient understands prevention, detection, and treatment of chronic complications.  Demonstrates understanding / competency    Patient understands how to develop strategies to address psychosocial issues.  Demonstrates understanding / competency    Patient understands how to develop strategies to promote health/change behavior.  Demonstrates understanding / competency      Outcomes   Expected Outcomes  Demonstrated interest in learning. Expect positive outcomes    Future DMSE  4-6 wks    Program Status  Completed      Subsequent Visit   Since your last visit have you continued or begun to  take your medications as prescribed?  Yes    Since your last visit have you experienced any weight changes?  Loss    Since your last visit, are you checking your blood glucose at least once a day?  Yes       Individualized Plan for Diabetes Self-Management Training:   Learning Objective:  Patient will have a greater understanding of diabetes self-management. Patient education plan is to attend individual and/or group sessions per assessed needs and concerns.   Plan:   There are no Patient Instructions on file for this visit.  Expected Outcomes:  Demonstrated interest in learning. Expect positive outcomes  Education material provided: ADA Diabetes: Your Take Control Guide, Meal plan card, Snack sheet and Carbohydrate counting sheet  If problems or questions, patient to contact team via:  Phone  Future DSME appointment: 4-6 wks

## 2018-10-28 ENCOUNTER — Ambulatory Visit (INDEPENDENT_AMBULATORY_CARE_PROVIDER_SITE_OTHER): Payer: Medicare Other | Admitting: Podiatry

## 2018-10-28 ENCOUNTER — Ambulatory Visit: Payer: Medicare Other | Admitting: Registered"

## 2018-10-28 ENCOUNTER — Encounter: Payer: Self-pay | Admitting: Podiatry

## 2018-10-28 VITALS — Temp 96.7°F

## 2018-10-28 DIAGNOSIS — M79672 Pain in left foot: Secondary | ICD-10-CM

## 2018-10-28 DIAGNOSIS — L97521 Non-pressure chronic ulcer of other part of left foot limited to breakdown of skin: Secondary | ICD-10-CM

## 2018-10-28 DIAGNOSIS — M216X9 Other acquired deformities of unspecified foot: Secondary | ICD-10-CM

## 2018-10-28 NOTE — Progress Notes (Signed)
Subjective: Joshua [name] is a 68 y.o. is seen today in office s/p left foot fourth metatarsal condylectomy preformed on 10/13/2018.  He presents today as a walk-in appointment because he is noticed some intermittent discomfort at the top of the incision in the last 2 days and he has not had any discomfort before he was on the area checked.  He states it is not continuous and overall he feels well and denies any fevers, chills, nausea, vomiting.  No calf pain, chest pain, shortness of breath.  Objective: General: No acute distress, AAOx3  DP/PT pulses palpable 2/4, CRT < 3 sec to all digits.  LEFT foot: Incision is well coapted without any evidence of dehiscence and sutures are intact.  There is minimal swelling to the dorsal aspect of the foot.  There is no tenderness palpation the surgical site.  There is no surrounding erythema, ascending sialitis or any fluctuation or crepitation or any signs of infection.  Regards to the wound left foot submetatarsal for the hyperkeratotic tissue with a small wound present.  I debrided the wound and the wound appears to be doing well any signs of infection there is no drainage or pus.  Overall the wound is getting smaller and made good improvements in the last saw him.  No other areas of tenderness to bilateral lower extremities.  No other open lesions or pre-ulcerative lesions.  No pain with calf compression, swelling, warmth, erythema.   Assessment and Plan:  Status post left foot fourth metatarsal condylectomy doing well with no complications   -Treatment options discussed including all alternatives, risks, and complications -Overall there is minimal swelling there is no signs of infection.  There is no tenderness on exam today.  I did sharply debride the wound today without any complications submetatarsal 4 to overall the wound is getting better.  I did clean the areas apply small amount of Betadine a dry sterile dressing.  Continue cam boot at all times  encouraged elevation. -I will see him back on Thursday as his regularly scheduled appointment to further evaluate the wound or sooner if any issues are to arise. -Monitor for any clinical signs or symptoms of infection and DVT/PE and directed to call the office immediately should any occur or go to the ER. -Follow-up on Thursday for dressing change or sooner if any problems arise. In the meantime, encouraged to call the office with any questions, concerns, change in symptoms.   Celesta Gentile, DPM

## 2018-11-01 ENCOUNTER — Ambulatory Visit (INDEPENDENT_AMBULATORY_CARE_PROVIDER_SITE_OTHER): Payer: Self-pay | Admitting: Podiatry

## 2018-11-01 DIAGNOSIS — Z09 Encounter for follow-up examination after completed treatment for conditions other than malignant neoplasm: Secondary | ICD-10-CM

## 2018-11-01 DIAGNOSIS — M79672 Pain in left foot: Secondary | ICD-10-CM

## 2018-11-04 NOTE — Progress Notes (Signed)
Subjective: Joshua [name] is a 67 y.o. is seen today in office s/p left foot fourth metatarsal condylectomy preformed on 10/13/2018.  He states he is doing better.  Denies any pain denies any increase in swelling or redness.  Presents today for wound check.  He has no new concerns.  He is been wearing the surgical shoe.  The pain that he came in for has resolved. Denies any fevers, chills, nausea, vomiting.  No calf pain, chest pain, shortness of breath.  Objective: General: No acute distress, AAOx3  DP/PT pulses palpable 2/4, CRT < 3 sec to all digits.  LEFT foot: Incision is well coapted without any evidence of dehiscence and sutures are intact.  There is slight movement across the incision site.  There is no edema, erythema the dorsal aspect of foot and there is no clinical signs of infection noted to the incision site.  There is hyperkeratotic lesion submetatarsal 4 on the left foot.  Upon debridement of this there is a small superficial wound still present measuring approximately 0.4 x 0.4 cm.  There is no probing to bone, undermining or tunneling.  No surrounding erythema, ascending cellulitis.  No fluctuation crepitation.  No malodor.  No other areas of tenderness to bilateral lower extremities.  No other open lesions or pre-ulcerative lesions.  No pain with calf compression, swelling, warmth, erythema.   Assessment and Plan:  Status post left foot fourth metatarsal condylectomy doing well with no complications   -Incisions healing well with any clinical signs of infection.  That sharply through the wound without any complications or bleeding.  Betadine was applied followed by dry sterile dressing.  Keep dressing clean, dry, intact and I will see him back on Monday.  If he needs to be changed advantage with a similar dressing otherwise he can leave it intact.  Limit weightbearing.  Encouraged elevation.  Trula Slade DPM

## 2018-11-04 NOTE — Progress Notes (Signed)
Subjective: Joshua [name] is a 67 y.o. is seen today in office s/p left foot fourth metatarsal condylectomy preformed on 10/13/2018.  He states he is doing well.  He presents today for possible suture removal.  He has remained in the surgical shoe.  He has no concerns.  He said not having any pain. Denies any fevers, chills, nausea, vomiting.  No calf pain, chest pain, shortness of breath.  Objective: General: No acute distress, AAOx3  DP/PT pulses palpable 2/4, CRT < 3 sec to all digits.  LEFT foot: Incision is well coapted without any evidence of dehiscence and sutures are intact.  I remove The sutures and there is some slight motion across the incision site left otherwise intact.  There is no edema, erythema, drainage or pus or any clinical signs of infection noted to the area.  I did agree the hyperkeratotic tissue in the wound submetatarsal 4 area and continues to improve.  There is no probing, undermining or tunneling.  No skin erythema, ascending cellulitis.  No fluctuation crepitation or any malodor. No other open lesions or pre-ulcerative lesions.  No pain with calf compression, swelling, warmth, erythema.   Assessment and Plan:  Status post left foot fourth metatarsal condylectomy doing well with no complications   -Incisions healing well with any clinical signs of infection.  Half of the sutures removed without complications but since there was some mild motion about the remainder intact.  That sharply through the wound without any complications or bleeding to remove some of the hyperkeratotic periwound.  Betadine was applied followed by dry sterile dressing.  Keep dressing clean, dry, intact and I will see him back on Monday.  If he needs to be changed advantage with a similar dressing otherwise he can leave it intact.  Limit weightbearing.  Encouraged elevation.  Trula Slade DPM

## 2018-11-08 ENCOUNTER — Ambulatory Visit (INDEPENDENT_AMBULATORY_CARE_PROVIDER_SITE_OTHER): Payer: Medicare Other | Admitting: Podiatry

## 2018-11-08 ENCOUNTER — Encounter: Payer: Self-pay | Admitting: Podiatry

## 2018-11-08 DIAGNOSIS — L97521 Non-pressure chronic ulcer of other part of left foot limited to breakdown of skin: Secondary | ICD-10-CM

## 2018-11-08 DIAGNOSIS — Z09 Encounter for follow-up examination after completed treatment for conditions other than malignant neoplasm: Secondary | ICD-10-CM

## 2018-11-10 NOTE — Progress Notes (Signed)
Subjective: Joshua [name] is a 67 y.o. is seen today in office s/p left foot fourth metatarsal condylectomy preformed on 10/13/2018.  Overall he states he is doing much better.  He took it upon himself to try to wear a regular shoe he states he is feeling great he has no concerns and is very happy with the progress.  He denies any drainage or pus or any swelling. Denies any fevers, chills, nausea, vomiting.  No calf pain, chest pain, shortness of breath.  Objective: General: No acute distress, AAOx3  DP/PT pulses palpable 2/4, CRT < 3 sec to all digits.  LEFT foot: Incision is well coapted without any evidence of dehiscence and sutures are intact dorsally.  Hyperkeratotic lesion submetatarsal 4.  Upon debridement there is no underlying ulceration, drainage and the wound appears to be healed.  No clinical signs of infection noted.  No significant swelling to the foot there is no erythema or warmth.  No other open lesions or pre-ulcerative lesions.  No pain with calf compression, swelling, warmth, erythema.   Assessment and Plan:  Status post left foot fourth metatarsal condylectomy doing well with no complications   -Incisions healing well with anyout clinical signs of infection.  I removed the remainder of the sutures without any complications after removal the incision remained well coapted.  Antibiotic ointment and a bandage was applied.  I have also sharply debrided the hyperkeratotic lesion submetatarsal 4 and the wound appears to be healed.  He is back to a regular shoe and we can continue at this point and monitor for any recurrence.  With a diabetic insert with the offloading. -Watch for any reoccurrence or any signs or symptoms of infection going to immediately should any occur.  Trula Slade DPM

## 2018-11-12 ENCOUNTER — Telehealth: Payer: Self-pay | Admitting: Neurology

## 2018-11-12 NOTE — Telephone Encounter (Signed)
Jade, I got a message that pt no showed appt with Dr. Vertell Limber.  Does he wish to cancel surgery?

## 2018-11-15 ENCOUNTER — Encounter: Payer: Self-pay | Admitting: Podiatry

## 2018-11-15 NOTE — Telephone Encounter (Signed)
Spoke with patient.  He had gotten confused and couldn't remember if he had an appt here or with Dr. Vertell Limber that day. He called our office and they told him he had no appts in the system for that day - he didn't realize Dr. Vertell Limber was outside the system. He was given the number to call him back and reschedule this appt.  Dr. Carles Collet Juluis Rainier.

## 2018-11-22 ENCOUNTER — Ambulatory Visit: Payer: Medicare Other | Admitting: Podiatry

## 2018-11-25 ENCOUNTER — Ambulatory Visit: Payer: Medicare Other | Admitting: Podiatry

## 2018-11-25 NOTE — Progress Notes (Addendum)
Subjective:   Joshua [name] was seen in consultation in the movement disorder clinic at the request of [name], MD.  His PCP is [name], MD.  This patient is accompanied in the office by his wife who supplements the history.  The evaluation is for possible DBS for tremor.  The records that were made available to me were reviewed.  Tremor started approximately27 years ago.  It has gotten worse over the last 4 years  and involves the bilateral UE.  The R hand is worse than the L.  The mornings are worse than later in the day.    Tremor is most noticeable when he is going to hold something and when trying to approximate an object.   There is a family hx of tremor in his mother/brother/maternal uncle.  Pt started on primidone in about 2014, took it for about a year and was not sure that it was helpful.  He restarted it in Jan. 2018 at 25 mg bid.  He has tried weighted pens, which were somewhat helpful, but weighted utensils were not.  At the end of July, 2018, his primidone was increased to 50 mg bid.  He expressed interest in getting information on DBS therapy.  Affected by caffeine:  No. (drinks 1 cup coffee per day; drinks 1-2 large glasses tea per day) Affected by alcohol:  No. (none since May; prior 1-2 per week) Affected by stress:  No. Affected by fatigue:  No. Spills soup if on spoon:  Yes.   Spills glass of liquid if full:  Yes.   Affects ADL's (tying shoes, brushing teeth, etc):  Yes.   (shaves with blade with 2 hands)  Current/Previously tried tremor medications: primidone  Current medications that may exacerbate tremor:  n/a  Outside reports reviewed: historical medical records, lab reports and referral letter/letters.  The patient did have a left total knee replacement on May 7.  About a week later he ended up back in the hospital with DVT/PE.  He is on Lovenox for that.  12/28/17 update: Patient is seen back in follow-up, as consideration for possible DBS therapy for essential  tremor.  We held off on further exploration last visit because he had a DVT/PE in May and was on Lovenox.  I called PCP office and got notes from last office visit.  he is now on eliquis.  Patient reports today that the plan was changed and he needed to be on the eliquis for a year (until May).  He is still on primidone, 50 mg twice per day.  He has an appointment later this week with Dr. Jannifer Franklin at [facility] neurology but thinks he has to cancel that.  It was originally supposed to be last week but [facility] changed the appt and now he cant make it b/c he is working.  09/09/18 update: Patient is seen today in follow-up for tremor.  Records are reviewed since last visit.  He is now off Eliquis.  He is back on primidone, 50 mg bid.  He does have neurocognitive testing with Dr. Si Raider.  There was no evidence of psychiatric diagnoses or dementia.  Patient desires to proceed with DBS.  "tremor is the one thing that I cannot adjust to."  He cannot button clothes.  He has trouble eating soup.  He cannot eat with a fork.  Both hands shake equally but he is R handed.  He has been seeing podiatry for a foot ulcer.  He is considering surgery  11/29/18 update: Patient is seen today in follow-up for tremor and for preop DBS video.  He is accompanied by his wife who supplements the history.  He has completed his preop MRI.  I have reviewed these images.  The patient was supposed to have an appointment with Dr. Vertell Limber, but there was some confusion and he missed the appointment.  He states that he called to r/s but couldn't get an appt until June.  The patient is still on primidone 50 mg bid.  In regards to his leg wound, he had left foot fourth metatarsal condylectomy on October 13, 2018.  I have reviewed those records.  He last followed up on November 08, 2018.  He is back in a regular shoe.  Records indicate no evidence of infection.  Pt states that "it is great now."  No Known Allergies  Outpatient Encounter Medications as of  11/29/2018  Medication Sig  . amLODipine (NORVASC) 5 MG tablet   . aspirin 81 MG tablet Take 1 tablet (81 mg total) by mouth daily.  Marland Kitchen atorvastatin (LIPITOR) 40 MG tablet Take 40 mg by mouth at bedtime.   . docusate sodium (COLACE) 100 MG capsule Take 1 capsule (100 mg total) by mouth 2 (two) times daily.  Marland Kitchen escitalopram (LEXAPRO) 20 MG tablet Take 20 mg by mouth at bedtime.  . fenofibrate 54 MG tablet Take 54 mg by mouth daily.  . hydrochlorothiazide (HYDRODIURIL) 25 MG tablet Take 25 mg by mouth daily.  Marland Kitchen HYDROcodone-acetaminophen (NORCO/VICODIN) 5-325 MG tablet Take 1 tablet by mouth every 6 (six) hours as needed.  . insulin lispro (HUMALOG KWIKPEN) 100 UNIT/ML KiwkPen Humalog KwikPen (U-100) Insulin 100 unit/mL subcutaneous  . losartan (COZAAR) 100 MG tablet Take 100 mg by mouth daily.  . Lutein-Zeaxanthin 25-5 MG CAPS Take 1 tablet by mouth daily.  . methocarbamol (ROBAXIN) 500 MG tablet Take 1 tablet (500 mg total) by mouth every 6 (six) hours as needed for muscle spasms.  . Multiple Vitamins-Minerals (MULTIVITAMIN PO) Take 1 tablet by mouth daily.   . mupirocin ointment (BACTROBAN) 2 % Apply 1 application topically 2 (two) times daily.  . Omega-3 Fatty Acids (FISH OIL) 1000 MG CAPS Take 2,000 mg by mouth daily. Reported on 02/13/2016  . ONETOUCH VERIO test strip   . Polyethyl Glycol-Propyl Glycol (SYSTANE OP) Place 1-2 drops into both eyes 2 (two) times daily as needed (dry eyes).  . polyethylene glycol (MIRALAX / GLYCOLAX) packet Take 17 g by mouth 2 (two) times daily.  . primidone (MYSOLINE) 50 MG tablet Take 1 tablet (50 mg total) by mouth 2 (two) times daily.  . promethazine (PHENERGAN) 25 MG tablet Take 1 tablet (25 mg total) by mouth every 8 (eight) hours as needed for nausea or vomiting.  . silver sulfADIAZINE (SILVADENE) 1 % cream Apply 1 application topically daily.  Nelva Nay SOLOSTAR 300 UNIT/ML SOPN Inject 70 Units into the skin 2 (two) times daily. Breakfast and supper   No  facility-administered encounter medications on file as of 11/29/2018.     Past Medical History:  Diagnosis Date  . Benign essential tremor   . Benign positional vertigo   . CVA (cerebral vascular accident) ([place])    x2 - L retina, 1 right parietal  . Degenerative arthritis   . Diabetes mellitus   . Dyslipidemia   . GERD (gastroesophageal reflux disease)    hiatal hernia  . Gout   . H/O: vasectomy   . Hx of appendectomy   . Hx  of tonsillectomy   . Hypertension   . 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 Melanoma ([place])   . Obesity   . OSA on CPAP    setting = 5  . Tremor, essential 06/22/2017    Past Surgical History:  Procedure Laterality Date  . APPENDECTOMY    . arthroscopic knee surgery Bilateral   . CATARACT EXTRACTION Bilateral   . NASAL SEPTUM SURGERY    . TONSILLECTOMY    . TOTAL KNEE ARTHROPLASTY Left 03/30/2017   Procedure: LEFT TOTAL KNEE ARTHROPLASTY;  Surgeon: Paralee Cancel, MD;  Location: [facility];  Service: Orthopedics;  Laterality: Left;    Social History   Socioeconomic History  . Marital status: Married    Spouse name: [name]  . Number of children: 2  . Years of education: Bachelors   . Highest education level: Not on file  Occupational History  . Occupation: retired    Fish farm manager: [employer]    Comment: teaching/coaching  Social Needs  . Financial resource strain: Not on file  . Food insecurity:    Worry: Not on file    Inability: Not on file  . Transportation needs:    Medical: Not on file    Non-medical: Not on file  Tobacco Use  . Smoking status: Former Smoker    Packs/day: 1.00    Years: 10.00    Pack years: 10.00    Types: Cigarettes    Last attempt to quit: 11/25/1983    Years since quitting: 35.0  . Smokeless tobacco: Never Used  Substance and Sexual Activity  . Alcohol use: Yes    Alcohol/week: 2.0 standard drinks    Types: 2 Standard drinks or equivalent per week    Comment: occassionally  . Drug use: No  . Sexual  activity: Not on file  Lifestyle  . Physical activity:    Days per week: Not on file    Minutes per session: Not on file  . Stress: Not on file  Relationships  . Social connections:    Talks on phone: Not on file    Gets together: Not on file    Attends religious service: Not on file    Active member of club or organization: Not on file    Attends meetings of clubs or organizations: Not on file    Relationship status: Not on file  . Intimate partner violence:    Fear of current or ex partner: Not on file    Emotionally abused: Not on file    Physically abused: Not on file    Forced sexual activity: Not on file  Other Topics Concern  . Not on file  Social History Narrative   Patient lives at home with wife. [name](   Patient has 2 children that are in good health.    Patient works for [employer]. Retired .   Patient has a Bachelors degree in History.     Family Status  Relation Name Status  . Mother  Deceased at age 37       Cerebral aneurysm  . Father  Deceased at age 47       Alzheimer's disease  . Brother  Alive  . Mat Uncle  (Not Specified)  . MGM  (Not Specified)  . Son 2 Alive  . Neg Hx  (Not Specified)    Review of Systems A complete 10 system ROS was obtained and was negative apart from what is mentioned.  Objective:   VITALS:   Vitals:   11/29/18 1309  BP: 140/78  Pulse: 80  SpO2: 94%  Weight: 279 lb (126.6 kg)  Height: 6\' 4"  (1.93 m)   GEN:  The patient appears stated age and is in NAD. HEENT:  Normocephalic, atraumatic.  The mucous membranes are moist. The superficial temporal arteries are without ropiness or tenderness. CV:  RRR Lungs:  CTAB Neck/HEME:  There are no carotid bruits bilaterally.  Neurological examination:  Orientation: The patient is alert and oriented x3. Cranial nerves: There is good facial symmetry. The speech is fluent and clear. Soft palate rises symmetrically and there is no tongue deviation. Hearing is  intact to conversational tone. Sensation: Sensation is intact to light touch throughout Motor: Strength is 5/5 in the bilateral upper and lower extremities.   Shoulder shrug is equal and symmetric.  There is no pronator drift.  MOVEMENT EXAM: Tremor: No rest tremor is noted today.  He has at least moderate postural tremor on the right and mild to moderate on the left.  He has trouble with Archimedes spirals, especially on the right.  He spills water when asked to pour it from one glass to another, especially when the water is in the right hand.  Addendum labs: Lab work is received and dated September 29, 2018.  Sodium is 143, potassium 4.4, chloride 106, CO2 29, BUN 22, creatinine 1.4, glucose 144.  White blood cells were 8.9, hemoglobin 13.6, hematocrit 43.8 and platelets 268. No A1C was included with current labs     Assessment/Plan:   1.  Essential Tremor, overall moderate  -He is on primidone, 50 mg twice per day.  We will keep him on this until surgery, at which point we will wean him off of this.  -I talked to the patient about the logistics associated with DBS therapy.  I talked to the patient about risks/benefits/side effects of DBS therapy.  We talked about risks which included but were not limited to infection, paralysis, intraoperative seizure, death, stroke, bleeding around the electrode.   I talked to patient about fiducial placement 1 week prior to DBS therapy.  I talked to the patient about what to expect in the operating room, including the fact that this is an awake surgery.  We talked about battery placement as well as which is done under general anesthesia, generally approximately one week following the initial surgery.  We also talked about the fact that the patient will need to be off of medications for surgery.  Patient would like bilateral VIM DBS, understanding the increased risk of loss of balance with bilateral surgery.  He would like the [employer] device.  The patient  and family were given the opportunity to ask questions, which they did, and I answered them to the best of my ability today.  -Preop video was done today.  -We will try to set up an appointment again with Dr. Vertell Limber.  -We will try to get more recent lab work.   2.  Peripheral neuropathy  -The patient has clinical examination evidence of a diffuse peripheral neuropathy, which certainly can affect gait and balance.  We discussed safety associated with peripheral neuropathy.  His is likely due to DM.  We discussed the importance of controlling the diabetes, especially if we are to consider deep brain stimulation surgery.  3.  Foot ulcer  -He had surgery and this has now resolved.  4.  Much greater than 50% of this visit was spent in  counseling and coordinating care.  Total face to face time:  30 min    CC:  [name], MD

## 2018-11-29 ENCOUNTER — Ambulatory Visit (INDEPENDENT_AMBULATORY_CARE_PROVIDER_SITE_OTHER): Payer: Medicare Other | Admitting: Neurology

## 2018-11-29 ENCOUNTER — Other Ambulatory Visit: Payer: Self-pay

## 2018-11-29 ENCOUNTER — Telehealth: Payer: Self-pay | Admitting: Neurology

## 2018-11-29 ENCOUNTER — Encounter: Payer: Self-pay | Admitting: Neurology

## 2018-11-29 VITALS — BP 140/78 | HR 80 | Ht 76.0 in | Wt 279.0 lb

## 2018-11-29 DIAGNOSIS — G250 Essential tremor: Secondary | ICD-10-CM

## 2018-11-29 NOTE — Telephone Encounter (Signed)
LMOM with Olivia in Dr. Melven Sartorius office. She is to call me back and let me know what is happening with this.

## 2018-11-29 NOTE — Telephone Encounter (Signed)
-----   Message from [place], DO sent at 11/29/2018  2:59 PM EST ----- Please call to Dr. Melven Sartorius office.  Pt did call to r/s appt but was told it would be June and he freaked out and didn't make the appt.  I would doubt it is June.  See if you can schedule appt for him?  I will text brian as well.

## 2018-11-30 ENCOUNTER — Ambulatory Visit: Payer: Medicare Other | Admitting: Skilled Nursing Facility1

## 2018-11-30 NOTE — Telephone Encounter (Signed)
Appt made with [facility] for patient to be seen on 01/03/19 at 8:30 am with Dr. Vertell Limber. Patient made aware. Dr. Carles Collet Juluis Rainier.

## 2018-12-02 ENCOUNTER — Encounter: Payer: Self-pay | Admitting: Podiatry

## 2018-12-02 ENCOUNTER — Ambulatory Visit (INDEPENDENT_AMBULATORY_CARE_PROVIDER_SITE_OTHER): Payer: Medicare Other | Admitting: Podiatry

## 2018-12-02 VITALS — Temp 97.1°F

## 2018-12-02 DIAGNOSIS — E11621 Type 2 diabetes mellitus with foot ulcer: Secondary | ICD-10-CM

## 2018-12-02 DIAGNOSIS — L97521 Non-pressure chronic ulcer of other part of left foot limited to breakdown of skin: Secondary | ICD-10-CM

## 2018-12-02 DIAGNOSIS — L97529 Non-pressure chronic ulcer of other part of left foot with unspecified severity: Secondary | ICD-10-CM

## 2018-12-02 DIAGNOSIS — Z09 Encounter for follow-up examination after completed treatment for conditions other than malignant neoplasm: Secondary | ICD-10-CM

## 2018-12-02 NOTE — Progress Notes (Signed)
Subjective: Joshua [name] is a 68 y.o. is seen today in office s/p left foot fourth metatarsal condylectomy preformed on 10/13/2018.  He states that overall he is doing "very well" on his left foot. He states this has been a life changing experience and he is very happy with the results.  He denies any swelling or redness or any drainage from the wound is healed he has no other wounds.  He does need new diabetic shoes. Denies any fevers, chills, nausea, vomiting.  No calf pain, chest pain, shortness of breath.  Objective: General: No acute distress, AAOx3  DP/PT pulses palpable 2/4, CRT < 3 sec to all digits.  LEFT foot: Incision is well healed with a scar formed.  On the left foot submetatarsal 4 minimal hyperkeratotic tissue and upon debridement there is no underlying ulceration, drainage or any signs of infection noted today.  No other open lesions or pre-ulcerative lesions are identified at this time. No pain with calf compression, swelling, warmth, erythema.   Assessment and Plan:  Status post left foot fourth metatarsal condylectomy doing well with no complications   -At this time he is doing well the incision is healed as well as the wound.  I will discharge home with postoperative cares as he is healed however I do want to get him to do diabetic shoes and inserts to help prevent any reoccurrence.  I completed paperwork today and I gave it to [facility].  If we cannot do them I will refer him back at the [facility] and will try to get authorization for [place] to get them.  Discussed the importance of daily foot inspection.  Follow-up in 3 months for diabetic foot check or sooner if any issues are to arise.  Trula Slade DPM

## 2018-12-06 ENCOUNTER — Encounter (HOSPITAL_COMMUNITY): Payer: Self-pay | Admitting: Emergency Medicine

## 2018-12-06 ENCOUNTER — Ambulatory Visit (HOSPITAL_COMMUNITY)
Admission: EM | Admit: 2018-12-06 | Discharge: 2018-12-06 | Disposition: A | Discharge status: Home / Self Care | Payer: Medicare Other | Attending: Family Medicine | Admitting: Family Medicine

## 2018-12-06 DIAGNOSIS — K1120 Sialoadenitis, unspecified: Secondary | ICD-10-CM | POA: Insufficient documentation

## 2018-12-06 LAB — POCT RAPID STREP A: STREPTOCOCCUS, GROUP A SCREEN (DIRECT): NEGATIVE

## 2018-12-06 MED ORDER — CEPHALEXIN 500 MG PO CAPS: 500.0000 mg | ORAL_CAPSULE | Freq: Two times a day (BID) | ORAL | 0 refills | Status: DC

## 2018-12-06 NOTE — ED Provider Notes (Signed)
[place]    CSN: [id number] Arrival date & time: 12/06/18  1716     History   Chief Complaint Chief Complaint  Patient presents with  . Sore Throat    HPI Joshua [name] is a 68 y.o. male.   He is presenting with sore throat and throat fullness.  He has been sick since Saturday.  He has had a temperature of 100.9.  He has received a flu vaccine.  Denies having trouble swallowing or breathing.  Physical symptoms are getting worse.  Symptoms are constant in duration.  HPI  Past Medical History:  Diagnosis Date  . Benign essential tremor   . Benign positional vertigo   . CVA (cerebral vascular accident) ([place])    x2 - L retina, 1 right parietal  . Degenerative arthritis   . Diabetes mellitus   . Dyslipidemia   . GERD (gastroesophageal reflux disease)    hiatal hernia  . Gout   . H/O: vasectomy   . Hx of appendectomy   . Hx of tonsillectomy   . Hypertension   . 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 Melanoma ([place])   . Obesity   . OSA on CPAP    setting = 5  . Tremor, essential 06/22/2017    Patient Active Problem List   Diagnosis Date Noted  . Tremor, essential 06/22/2017  . OSA on CPAP 04/10/2017  . Essential hypertension 04/10/2017  . Pulmonary thromboembolism ([place]) 04/10/2017  . PE (pulmonary thromboembolism) ([place]) 04/09/2017  . S/P left TKA 03/30/2017  . S/P total knee replacement 03/30/2017  . Cellulitis of left foot 03/07/2016  . Pre-ulcerative calluses 03/07/2016  . Type 2 diabetes mellitus with left diabetic foot ulcer ([place]) 01/14/2016  . Diabetes mellitus type 2 in obese ([place]) 08/07/2008  . GERD 08/07/2008    Past Surgical History:  Procedure Laterality Date  . APPENDECTOMY    . arthroscopic knee surgery Bilateral   . CATARACT EXTRACTION Bilateral   . NASAL SEPTUM SURGERY    . TONSILLECTOMY    . TOTAL KNEE ARTHROPLASTY Left 03/30/2017   Procedure: LEFT TOTAL KNEE ARTHROPLASTY;  Surgeon: Paralee Cancel, MD;  Location: [facility];   Service: Orthopedics;  Laterality: Left;       Home Medications    Prior to Admission medications   Medication Sig Start Date End Date Taking? Authorizing Provider  amLODipine (NORVASC) 5 MG tablet  09/28/18   [provider]  aspirin 81 MG tablet Take 1 tablet (81 mg total) by mouth daily. 04/08/18   Venancio Poisson, NP  atorvastatin (LIPITOR) 40 MG tablet Take 40 mg by mouth at bedtime.     [provider]  cephALEXin (KEFLEX) 500 MG capsule Take 1 capsule (500 mg total) by mouth 2 (two) times daily. 12/06/18   Rosemarie Ax, MD  docusate sodium (COLACE) 100 MG capsule Take 1 capsule (100 mg total) by mouth 2 (two) times daily. 03/30/17   Danae Orleans, PA-C  escitalopram (LEXAPRO) 20 MG tablet Take 20 mg by mouth at bedtime.    [provider]  fenofibrate 54 MG tablet Take 54 mg by mouth daily.    [provider]  hydrochlorothiazide (HYDRODIURIL) 25 MG tablet Take 25 mg by mouth daily.    [provider]  HYDROcodone-acetaminophen (NORCO/VICODIN) 5-325 MG tablet Take 1 tablet by mouth every 6 (six) hours as needed. 10/12/18   Trula Slade, DPM  insulin lispro (HUMALOG KWIKPEN) 100 UNIT/ML KiwkPen  Humalog KwikPen (U-100) Insulin 100 unit/mL subcutaneous    [provider]  losartan (COZAAR) 100 MG tablet Take 100 mg by mouth daily.    [provider]  Lutein-Zeaxanthin 25-5 MG CAPS Take 1 tablet by mouth daily.    [provider]  methocarbamol (ROBAXIN) 500 MG tablet Take 1 tablet (500 mg total) by mouth every 6 (six) hours as needed for muscle spasms. 03/30/17   Danae Orleans, PA-C  Multiple Vitamins-Minerals (MULTIVITAMIN PO) Take 1 tablet by mouth daily.     [provider]  mupirocin ointment (BACTROBAN) 2 % Apply 1 application topically 2 (two) times daily. 08/26/18   Trula Slade, DPM  Omega-3 Fatty Acids (FISH OIL) 1000 MG CAPS Take 2,000 mg by mouth daily. Reported on 02/13/2016     [provider]  [facility] VERIO test strip  09/13/18   [provider]  Polyethyl Glycol-Propyl Glycol (SYSTANE OP) Place 1-2 drops into both eyes 2 (two) times daily as needed (dry eyes).    [provider]  polyethylene glycol (MIRALAX / GLYCOLAX) packet Take 17 g by mouth 2 (two) times daily. 03/30/17   Danae Orleans, PA-C  primidone (MYSOLINE) 50 MG tablet Take 1 tablet (50 mg total) by mouth 2 (two) times daily. 06/22/17   Kathrynn Ducking, MD  promethazine (PHENERGAN) 25 MG tablet Take 1 tablet (25 mg total) by mouth every 8 (eight) hours as needed for nausea or vomiting. 10/12/18   Trula Slade, DPM  silver sulfADIAZINE (SILVADENE) 1 % cream Apply 1 application topically daily. 02/09/18   Trula Slade, DPM  TOUJEO SOLOSTAR 300 UNIT/ML SOPN Inject 70 Units into the skin 2 (two) times daily. Breakfast and supper 07/31/15   [provider]    Family History Family History  Problem Relation Age of Onset  . Cerebral aneurysm Mother   . Tremor Mother   . Alzheimer's disease Father   . Tremor Brother   . Tremor Maternal Uncle   . Diabetes Maternal Grandmother   . Colon cancer Neg Hx     Social History Social History   Tobacco Use  . Smoking status: Former Smoker    Packs/day: 1.00    Years: 10.00    Pack years: 10.00    Types: Cigarettes    Last attempt to quit: 11/25/1983    Years since quitting: 35.0  . Smokeless tobacco: Never Used  Substance Use Topics  . Alcohol use: Yes    Alcohol/week: 2.0 standard drinks    Types: 2 Standard drinks or equivalent per week    Comment: occassionally  . Drug use: No     Allergies   Patient has no known allergies.   Review of Systems Review of Systems  Constitutional: Positive for fever.  HENT: Positive for sore throat.   Respiratory: Negative for cough.   Cardiovascular: Negative for chest pain.  Gastrointestinal: Negative for abdominal pain.  Musculoskeletal: Negative for back pain.   Skin: Negative for color change.     Physical Exam Triage Vital Signs ED Triage Vitals  Enc Vitals Group     BP 12/06/18 1825 (!) 147/85     Pulse Rate 12/06/18 1825 (!) 115     Resp 12/06/18 1825 18     Temp 12/06/18 1825 99.3 F (37.4 C)     Temp Source 12/06/18 1825 Oral     SpO2 12/06/18 1825 98 %     Weight --      Height --  Head Circumference --      Peak Flow --      Pain Score 12/06/18 1826 6     Pain Loc --      Pain Edu? --      Excl. in [place]? --    No data found.  Updated Vital Signs BP (!) 147/85 (BP Location: Right Arm)   Pulse (!) 115   Temp 99.3 F (37.4 C) (Oral)   Resp 18   SpO2 98%   Visual Acuity Right Eye Distance:   Left Eye Distance:   Bilateral Distance:    Right Eye Near:   Left Eye Near:    Bilateral Near:     Physical Exam Gen: NAD, alert, cooperative with exam,  ENT: normal lips, normal nasal mucosa, tympanic membranes clear and intact bilaterally, unable to view the oropharynx, significant tenderness to palpation over the submandibular glands. Eye: normal EOM, normal conjunctiva and lids CV:  no edema, +2 pedal pulses,  S1-S2   Resp: no accessory muscle use, non-labored, clear to auscultation bilaterally, no crackles or wheezes Skin: no rashes, no areas of induration  Neuro: normal tone, normal sensation to touch Psych:  normal insight, alert and oriented MSK: Normal gait, normal strength    UC Treatments / Results  Labs (all labs ordered are listed, but only abnormal results are displayed) Labs Reviewed  CULTURE, GROUP A STREP [facility])  POCT RAPID STREP A    EKG None  Radiology No results found.  Procedures Procedures (including critical care time)  Medications Ordered in UC Medications - No data to display  Initial Impression / Assessment and Plan / UC Course  I have reviewed the triage vital signs and the nursing notes.  Pertinent labs & imaging results that were available during my care of the patient  were reviewed by me and considered in my medical decision making (see chart for details).     Joshua [name] is a 68 year old male is presenting with sore throat.  Rapid strep was negative.  Has significant tenderness over the submandibular glands I would suggest sialadenitis.  Counseled on supportive care provided Keflex.  Given indications to follow-up and need to be seen in the emergency room.  Counseled on diabetic management with his medications.  Final Clinical Impressions(s) / UC Diagnoses   Final diagnoses:  Sialadenitis     Discharge Instructions     Please try the antibiotics  Please try to stay well hydrated  Please alternate ibuprofen or tylenol for fever  Please follow up with your regular doctor if your symptoms are ongoing.     ED Prescriptions    Medication Sig Dispense Auth. Provider   cephALEXin (KEFLEX) 500 MG capsule Take 1 capsule (500 mg total) by mouth 2 (two) times daily. 14 capsule Rosemarie Ax, MD     Controlled Substance Prescriptions [place] Controlled Substance Registry consulted? Not Applicable   Rosemarie Ax, MD 12/06/18 Joen Laura

## 2018-12-06 NOTE — ED Triage Notes (Signed)
Pt here for sore throat and fever x 3 days

## 2018-12-06 NOTE — Discharge Instructions (Signed)
Please try the antibiotics  Please try to stay well hydrated  Please alternate ibuprofen or tylenol for fever  Please follow up with your regular doctor if your symptoms are ongoing.

## 2018-12-09 ENCOUNTER — Telehealth: Payer: Self-pay | Admitting: Podiatry

## 2018-12-09 LAB — CULTURE, GROUP A STREP (THRC)

## 2018-12-09 NOTE — Telephone Encounter (Signed)
Called pt and left message to call to discuss diabetic shoes. Pt does not have a referral from [place] to our office we are not able to get the shoes covered thru them. Pt would have to get shoes there.

## 2018-12-31 ENCOUNTER — Other Ambulatory Visit: Payer: Self-pay | Admitting: Internal Medicine

## 2018-12-31 ENCOUNTER — Ambulatory Visit
Admission: RE | Admit: 2018-12-31 | Discharge: 2018-12-31 | Disposition: A | Discharge status: Home / Self Care | Payer: Medicare Other | Source: Ambulatory Visit | Attending: Internal Medicine | Admitting: Internal Medicine

## 2018-12-31 DIAGNOSIS — R591 Generalized enlarged lymph nodes: Secondary | ICD-10-CM

## 2018-12-31 DIAGNOSIS — R599 Enlarged lymph nodes, unspecified: Secondary | ICD-10-CM

## 2019-01-03 ENCOUNTER — Other Ambulatory Visit: Payer: Self-pay | Admitting: Internal Medicine

## 2019-01-05 ENCOUNTER — Other Ambulatory Visit: Payer: Self-pay | Admitting: Neurosurgery

## 2019-01-12 ENCOUNTER — Other Ambulatory Visit (HOSPITAL_COMMUNITY): Payer: Self-pay | Admitting: Neurosurgery

## 2019-01-12 ENCOUNTER — Other Ambulatory Visit: Payer: Self-pay | Admitting: Neurosurgery

## 2019-01-12 DIAGNOSIS — G250 Essential tremor: Secondary | ICD-10-CM

## 2019-01-13 DIAGNOSIS — R059 Cough, unspecified: Secondary | ICD-10-CM | POA: Insufficient documentation

## 2019-01-27 ENCOUNTER — Encounter: Payer: Self-pay | Admitting: Neurology

## 2019-01-27 ENCOUNTER — Ambulatory Visit (INDEPENDENT_AMBULATORY_CARE_PROVIDER_SITE_OTHER): Payer: Medicare Other | Admitting: Neurology

## 2019-01-27 VITALS — BP 148/86 | HR 84 | Ht 76.0 in | Wt 276.0 lb

## 2019-01-27 DIAGNOSIS — G250 Essential tremor: Secondary | ICD-10-CM | POA: Diagnosis not present

## 2019-01-27 NOTE — Patient Instructions (Addendum)
Starting tomorrow (01/28/2019) decrease Primidone to one tablet daily. Then on 02/07/2019 stop Primidone.   Post operative appointments provided.

## 2019-01-27 NOTE — Progress Notes (Signed)
Subjective:   Joshua [name] was seen in consultation in the movement disorder clinic at the request of [name], MD.  His PCP is [name], MD.  This patient is accompanied in the office by his wife who supplements the history.  The evaluation is for possible DBS for tremor.  The records that were made available to me were reviewed.  Tremor started approximately27 years ago.  It has gotten worse over the last 4 years  and involves the bilateral UE.  The R hand is worse than the L.  The mornings are worse than later in the day.    Tremor is most noticeable when he is going to hold something and when trying to approximate an object.   There is a family hx of tremor in his mother/brother/maternal uncle.  Pt started on primidone in about 2014, took it for about a year and was not sure that it was helpful.  He restarted it in Jan. 2018 at 25 mg bid.  He has tried weighted pens, which were somewhat helpful, but weighted utensils were not.  At the end of July, 2018, his primidone was increased to 50 mg bid.  He expressed interest in getting information on DBS therapy.  Affected by caffeine:  No. (drinks 1 cup coffee per day; drinks 1-2 large glasses tea per day) Affected by alcohol:  No. (none since May; prior 1-2 per week) Affected by stress:  No. Affected by fatigue:  No. Spills soup if on spoon:  Yes.   Spills glass of liquid if full:  Yes.   Affects ADL's (tying shoes, brushing teeth, etc):  Yes.   (shaves with blade with 2 hands)  Current/Previously tried tremor medications: primidone  Current medications that may exacerbate tremor:  n/a  Outside reports reviewed: historical medical records, lab reports and referral letter/letters.  The patient did have a left total knee replacement on May 7.  About a week later he ended up back in the hospital with DVT/PE.  He is on Lovenox for that.  12/28/17 update: Patient is seen back in follow-up, as consideration for possible DBS therapy for essential  tremor.  We held off on further exploration last visit because he had a DVT/PE in May and was on Lovenox.  I called PCP office and got notes from last office visit.  he is now on eliquis.  Patient reports today that the plan was changed and he needed to be on the eliquis for a year (until May).  He is still on primidone, 50 mg twice per day.  He has an appointment later this week with Dr. Jannifer Franklin at [facility] neurology but thinks he has to cancel that.  It was originally supposed to be last week but [facility] changed the appt and now he cant make it b/c he is working.  09/09/18 update: Patient is seen today in follow-up for tremor.  Records are reviewed since last visit.  He is now off Eliquis.  He is back on primidone, 50 mg bid.  He does have neurocognitive testing with Dr. Si Raider.  There was no evidence of psychiatric diagnoses or dementia.  Patient desires to proceed with DBS.  "tremor is the one thing that I cannot adjust to."  He cannot button clothes.  He has trouble eating soup.  He cannot eat with a fork.  Both hands shake equally but he is R handed.  He has been seeing podiatry for a foot ulcer.  He is considering surgery  11/29/18 update: Patient is seen today in follow-up for tremor and for preop DBS video.  He is accompanied by his wife who supplements the history.  He has completed his preop MRI.  I have reviewed these images.  The patient was supposed to have an appointment with Dr. Vertell Limber, but there was some confusion and he missed the appointment.  He states that he called to r/s but couldn't get an appt until June.  The patient is still on primidone 50 mg bid.  In regards to his leg wound, he had left foot fourth metatarsal condylectomy on October 13, 2018.  I have reviewed those records.  He last followed up on November 08, 2018.  He is back in a regular shoe.  Records indicate no evidence of infection.  Pt states that "it is great now."  01/27/19 update: Patient is seen today in follow-up for tremor.   Patient's insurance company has not approved his surgery until the Gretel Acre tremor rating scale was completed.  He comes in today for that can be completed.  He also comes in today for last-minute questions before surgery.   No Known Allergies  Outpatient Encounter Medications as of 01/27/2019  Medication Sig  . amLODipine (NORVASC) 5 MG tablet   . aspirin 81 MG tablet Take 1 tablet (81 mg total) by mouth daily.  Marland Kitchen atorvastatin (LIPITOR) 40 MG tablet Take 40 mg by mouth at bedtime.   . cephALEXin (KEFLEX) 500 MG capsule Take 1 capsule (500 mg total) by mouth 2 (two) times daily.  Marland Kitchen docusate sodium (COLACE) 100 MG capsule Take 1 capsule (100 mg total) by mouth 2 (two) times daily.  Marland Kitchen escitalopram (LEXAPRO) 20 MG tablet Take 20 mg by mouth at bedtime.  . fenofibrate 54 MG tablet Take 54 mg by mouth daily.  . hydrochlorothiazide (HYDRODIURIL) 25 MG tablet Take 25 mg by mouth daily.  Marland Kitchen HYDROcodone-acetaminophen (NORCO/VICODIN) 5-325 MG tablet Take 1 tablet by mouth every 6 (six) hours as needed.  . insulin lispro (HUMALOG KWIKPEN) 100 UNIT/ML KiwkPen Humalog KwikPen (U-100) Insulin 100 unit/mL subcutaneous  . losartan (COZAAR) 100 MG tablet Take 100 mg by mouth daily.  . Lutein-Zeaxanthin 25-5 MG CAPS Take 1 tablet by mouth daily.  . methocarbamol (ROBAXIN) 500 MG tablet Take 1 tablet (500 mg total) by mouth every 6 (six) hours as needed for muscle spasms.  . Multiple Vitamins-Minerals (MULTIVITAMIN PO) Take 1 tablet by mouth daily.   . mupirocin ointment (BACTROBAN) 2 % Apply 1 application topically 2 (two) times daily.  . Omega-3 Fatty Acids (FISH OIL) 1000 MG CAPS Take 2,000 mg by mouth daily. Reported on 02/13/2016  . ONETOUCH VERIO test strip   . Polyethyl Glycol-Propyl Glycol (SYSTANE OP) Place 1-2 drops into both eyes 2 (two) times daily as needed (dry eyes).  . polyethylene glycol (MIRALAX / GLYCOLAX) packet Take 17 g by mouth 2 (two) times daily.  . primidone (MYSOLINE) 50 MG  tablet Take 1 tablet (50 mg total) by mouth 2 (two) times daily.  . promethazine (PHENERGAN) 25 MG tablet Take 1 tablet (25 mg total) by mouth every 8 (eight) hours as needed for nausea or vomiting.  . silver sulfADIAZINE (SILVADENE) 1 % cream Apply 1 application topically daily.  Nelva Nay SOLOSTAR 300 UNIT/ML SOPN Inject 70 Units into the skin 2 (two) times daily. Breakfast and supper   No facility-administered encounter medications on file as of 01/27/2019.     Past Medical History:  Diagnosis Date  .  Benign essential tremor   . Benign positional vertigo   . CVA (cerebral vascular accident) ([place])    x2 - L retina, 1 right parietal  . Degenerative arthritis   . Diabetes mellitus   . Dyslipidemia   . GERD (gastroesophageal reflux disease)    hiatal hernia  . Gout   . H/O: vasectomy   . Hx of appendectomy   . Hx of tonsillectomy   . Hypertension   . 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 Melanoma ([place])   . Obesity   . OSA on CPAP    setting = 5  . Tremor, essential 06/22/2017    Past Surgical History:  Procedure Laterality Date  . APPENDECTOMY    . arthroscopic knee surgery Bilateral   . CATARACT EXTRACTION Bilateral   . NASAL SEPTUM SURGERY    . TONSILLECTOMY    . TOTAL KNEE ARTHROPLASTY Left 03/30/2017   Procedure: LEFT TOTAL KNEE ARTHROPLASTY;  Surgeon: Paralee Cancel, MD;  Location: [facility];  Service: Orthopedics;  Laterality: Left;    Social History   Socioeconomic History  . Marital status: Married    Spouse name: [name]  . Number of children: 2  . Years of education: Bachelors   . Highest education level: Not on file  Occupational History  . Occupation: retired    Fish farm manager: [employer]    Comment: teaching/coaching  Social Needs  . Financial resource strain: Not on file  . Food insecurity:    Worry: Not on file    Inability: Not on file  . Transportation needs:    Medical: Not on file    Non-medical: Not on file  Tobacco Use  . Smoking  status: Former Smoker    Packs/day: 1.00    Years: 10.00    Pack years: 10.00    Types: Cigarettes    Last attempt to quit: 11/25/1983    Years since quitting: 35.1  . Smokeless tobacco: Never Used  Substance and Sexual Activity  . Alcohol use: Yes    Alcohol/week: 2.0 standard drinks    Types: 2 Standard drinks or equivalent per week    Comment: occassionally  . Drug use: No  . Sexual activity: Not on file  Lifestyle  . Physical activity:    Days per week: Not on file    Minutes per session: Not on file  . Stress: Not on file  Relationships  . Social connections:    Talks on phone: Not on file    Gets together: Not on file    Attends religious service: Not on file    Active member of club or organization: Not on file    Attends meetings of clubs or organizations: Not on file    Relationship status: Not on file  . Intimate partner violence:    Fear of current or ex partner: Not on file    Emotionally abused: Not on file    Physically abused: Not on file    Forced sexual activity: Not on file  Other Topics Concern  . Not on file  Social History Narrative   Patient lives at home with wife. [name](   Patient has 2 children that are in good health.    Patient works for [employer]. Retired .   Patient has a Bachelors degree in History.     Family Status  Relation Name Status  . Mother  Deceased at age 76       Cerebral  aneurysm  . Father  Deceased at age 47       Alzheimer's disease  . Brother  Alive  . Mat Uncle  (Not Specified)  . MGM  (Not Specified)  . Son 2 Alive  . Neg Hx  (Not Specified)    Review of Systems Review of Systems  Constitutional: Negative.   HENT: Negative.   Eyes: Negative.   Cardiovascular: Negative.   Gastrointestinal: Negative.   Genitourinary: Negative.   Skin: Negative.      Objective:   VITALS:   Vitals:   01/27/19 0847  BP: (!) 148/86  Pulse: 84  SpO2: 96%  Weight: 276 lb (125.2 kg)  Height: 6\' 4"  (1.93 m)    GEN:  The patient appears stated age and is in NAD. HEENT:  Normocephalic, atraumatic.  The mucous membranes are moist. The superficial temporal arteries are without ropiness or tenderness. CV:  RRR Lungs:  CTAB Neck/HEME:  There are no carotid bruits bilaterally.  Neurological examination:  Orientation: The patient is alert and oriented x3. Cranial nerves: There is good facial symmetry. The speech is fluent and clear. Soft palate rises symmetrically and there is no tongue deviation. Hearing is intact to conversational tone. Sensation: Sensation is intact to light touch throughout Motor: Strength is 5/5 in the bilateral upper and lower extremities.   Shoulder shrug is equal and symmetric.  There is no pronator drift.  MOVEMENT EXAM: Tremor: some rest tremor in the R hand today.  Some vocal tremor.    He has at least moderate postural tremor on the right and mild to moderate on the left.  He has trouble with Archimedes spirals, especially on the right.  He spills water when asked to pour it from one glass to another, especially when the water is in the right hand.  labs: Lab work is received and dated September 29, 2018.  Sodium is 143, potassium 4.4, chloride 106, CO2 29, BUN 22, creatinine 1.4, glucose 144.  White blood cells were 8.9, hemoglobin 13.6, hematocrit 43.8 and platelets 268. No A1C was included with current labs     Assessment/Plan:   1.  Essential Tremor, overall moderate  -He is on primidone, 50 mg twice per day.  We will keep him on this until surgery, at which point we will wean him off of this.  -I talked to the patient about the logistics associated with DBS therapy.  He had many questions.    I talked to the patient about risks/benefits/side effects of DBS therapy.  We talked about risks which included but were not limited to infection, paralysis, intraoperative seizure, death, stroke, bleeding around the electrode.   I talked to patient about fiducial placement 1 week  prior to DBS therapy.  I talked to the patient about what to expect in the operating room, including the fact that this is an awake surgery.  We talked about battery placement as well as which is done under general anesthesia, generally approximately one week following the initial surgery.  We also talked about the fact that the patient will need to be off of medications for surgery.  The patient and family were given the opportunity to ask questions, which they did, and I answered them to the best of my ability today.  Patient again expresses desire for bilateral VIM DBS.  Understands risks for loss of balance with bilateral VIM surgery, which is about 30% risk.  We will start with L VIM  -Patient is scheduled for fiducials  on March 19.  -Patient is scheduled for deep brain stimulation surgery on March 27 and IPG placement on April 2 with IPG in the right chest  -Patient is given a weaning schedule for primidone.  Today, he will start decreasing primidone to 1 tablet/day and in 1 week he will stop the primidone.   2.  Peripheral neuropathy  -The patient has clinical examination evidence of a diffuse peripheral neuropathy, which certainly can affect gait and balance.  We discussed safety associated with peripheral neuropathy.  His is likely due to DM.  We discussed the importance of controlling the diabetes, especially if we are to consider deep brain stimulation surgery.  3.   Much greater than 50% of this visit was spent in counseling and coordinating care.  Total face to face time:  40 min    CC:  [name], MD

## 2019-01-29 NOTE — H&P (Signed)
Patient ID:   [id number] Patient: Joshua [name]  Date of Birth: 07/03/1951 Visit Type: Office Visit   Date: 01/03/2019 08:30 AM Provider: Cynia Abruzzo D. Zared Knoth MD   This 67 year old male presents for Tremors.  HISTORY OF PRESENT ILLNESS:  1.  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  Patient notes 30 year history of tremor, increased significantly over the last few years, bilateral upper extremity, right greater than left. He denies any tremor in his voice but notes that he is beginning to have a head tremor.  Off Eliquis x1 year (following DVT/PE June 2018)  History:  OSA (CPAP), HTN, melanoma above left eye, GERD, IDDM, CVA x2(left retina, right parietal), gout, ischemic optic neuropathy left Surgical history:  Left TKR May 2018, vasectomy, appendectomy, tonsillectomy, bilateral cataract, nasal septum repair, left foot November 2019  MRI brain on [place]          PAST MEDICAL HISTORY, SURGICAL HISTORY, FAMILY HISTORY, SOCIAL HISTORY AND REVIEW OF SYSTEMS I have reviewed the patient's past medical, surgical, family and social history as well as the comprehensive review of systems as included on the [facility] history form dated 01/03/2019, which I have signed.   MEDICATIONS: (added, continued or stopped this visit) Started Medication Directions Instruction Stopped   amlodipine 5 mg tablet take 1 tablet by oral route  every day     aspirin 81 mg tablet,delayed release take 1 tablet by oral route  every day     docusate sodium 100 mg capsule take 1 capsule by oral route  every day at bedtime as needed     escitalopram 20 mg tablet take 1 tablet by oral route  every day     fenofibrate 54 mg tablet take 1 tablet by oral route  every day     Fish Oil 1,000 mg (120 mg-180 mg) capsule take 1 by oral route  every day     Humalog Junior KwikPen (U-100) 100 unit/mL subcutaneous  half-unit pen inject by subcutaneous route as per insulin protocol     hydrochlorothiazide 25 mg tablet take 1 tablet by oral route  every day     hydrocodone 5 mg-acetaminophen 325 mg tablet take 1 tablet by oral route  every 6 hours as needed for pain     losartan 100 mg tablet take 1 tablet by oral route  every day     lutein 25 mg-zeaxanthin 5 mg capsule      methocarbamol 500 mg tablet take 1 tablet by oral route 3 times every day     mupirocin 2 % topical ointment apply by topical route 2 times every day a small amount to the affected area     polyethylene glycol 3350 17 gram/dose oral powder take (17G)  by oral route  every day mixed with 8 oz. water, juice, soda, coffee or tea     primidone 50 mg tablet take 1 tablet by oral route 2 times every day     promethazine 25 mg tablet take 1 tablet by oral route  every 6 hours as needed     Silvadene 1 % topical cream apply by topical route 2 times every day a 1/16 inch (1.5 mm) thick layer to entire burn area     Toujeo Max U-300 SoloStar 300 unit/mL (3 mL) subcutaneous insulin pen inject by subcutaneous route as per insulin protocol       ALLERGIES:   Ingredient Reaction Medication Name Comment  NO KNOWN ALLERGIES     No known allergies.   REVIEW OF SYSTEMS   See scanned patient registration form, dated 01/03/2019, signed and dated on 01/03/2019  Review of Systems Details System Neg/Pos Details  Constitutional Negative Chills, Fatigue, Fever, Malaise, Night sweats, Weight gain and Weight loss.  ENMT Negative Ear drainage, Hearing loss, Nasal drainage, Otalgia, Sinus pressure and Sore throat.  Eyes Negative Eye discharge, Eye pain and Vision changes.  Respiratory Negative Chronic cough, Cough, Dyspnea, Known TB exposure and Wheezing.  Cardio Negative Chest pain, Claudication, Edema and Irregular heartbeat/palpitations.  GI Negative Abdominal pain, Blood in stool, Change in stool pattern, Constipation, Decreased appetite, Diarrhea,  Heartburn, Nausea and Vomiting.  GU Negative Dribbling, Dysuria, Erectile dysfunction, Hematuria, Polyuria (Genitourinary), Slow stream, Urinary frequency, Urinary incontinence and Urinary retention.  Endocrine Negative Cold intolerance, Heat intolerance, Polydipsia and Polyphagia.  Neuro Positive Tremors.  Psych Negative Anxiety, Depression and Insomnia.  Integumentary Negative Brittle hair, Brittle nails, Change in shape/size of mole(s), Hair loss, Hirsutism, Hives, Pruritus, Rash and Skin lesion.  MS Negative Back pain, Joint pain, Joint swelling, Muscle weakness and Neck pain.  Hema/Lymph Negative Easy bleeding, Easy bruising and Lymphadenopathy.  Allergic/Immuno Negative Contact allergy, Environmental allergies, Food allergies and Seasonal allergies.  Reproductive Negative Penile discharge and Sexual dysfunction.   PHYSICAL EXAM:   Vitals Date Temp F BP Pulse Ht In Wt Lb BMI BSA Pain Score  01/03/2019  163/81 69 76 277 33.72  0/10    PHYSICAL EXAM Details General Level of Distress: no acute distress Overall Appearance: normal  Head and Face  Right Left  Fundoscopic Exam:  normal normal    Cardiovascular Cardiac: regular rate and rhythm without murmur  Right Left  Carotid Pulses: normal normal  Respiratory Lungs: clear to auscultation  Neurological Orientation: normal Recent and Remote Memory: normal Attention Span and Concentration:   normal Language: normal Fund of Knowledge: normal  Right Left Sensation: normal normal Upper Extremity Coordination: normal normal  Lower Extremity Coordination: normal normal  Musculoskeletal Gait and Station: normal  Right Left Upper Extremity Muscle Strength: normal normal Lower Extremity Muscle Strength: normal normal Upper Extremity Muscle Tone:  normal normal Lower Extremity Muscle Tone: normal normal   Motor Strength Upper and lower extremity motor strength was tested in the clinically pertinent muscles.     Deep  Tendon Reflexes  Right Left Biceps: normal normal Triceps: normal normal Brachioradialis: normal normal Patellar: normal normal Achilles: normal normal  Cranial Nerves II. Optic Nerve/Visual Fields: normal III. Oculomotor: normal IV. Trochlear: normal V. Trigeminal: normal VI. Abducens: normal VII. Facial: normal VIII. Acoustic/Vestibular: normal IX. Glossopharyngeal: normal X. Vagus: normal XI. Spinal Accessory: normal XII. Hypoglossal: normal  Motor and other Tests Lhermittes: negative Rhomberg: negative Pronator drift: absent     Right Left Hoffman's: normal normal Clonus: normal normal Babinski: normal normal   Additional Findings:  Upon examination, right greater than left bilateral upper extremity tremor, central vision loss of the left eye, full strength in bilateral upper extremities, no rigidity, reflexes symmetric, titubating head tremor, patient has significant difficult eating or drinking but is able to compensate, some tremor in voice noted.    IMPRESSION:   Explained methodology of DBS/IPG implantation to patient and discussed this process at length with use of model. Advised patient that the surgery will take place in 3 stages and that the patient will be awake during one stage of the surgery. Discussed with patient that the IPG   will be need to be replaced at some point, the exact timing of the replacement is dependent upon the battery used. Advised patient that programming of the DBS will be completed by Dr. Tat approximately 6 weeks after all stages of surgery are completed.  Discussed risks of DBS/IPG placement including: death (low risk of this), infection (requires device to be removed), mechanical malfunctions of device, and side effects of the DBS. Recommended patient request video from Dr. Tat regarding DBS/IPG to obtain even more information about the process. Patient endorses that he wanted to proceed with DBS/IPG implantation and had no further  questions at this time. Patient will follow-up after DBS/ IPG implantation.  Upon examination, right greater than left bilateral upper extremity tremor, central vision loss of the left eye, full strength in bilateral upper extremities, no rigidity, reflexes symmetric, titubating head tremor, patient has significant difficult eating or drinking but is able to compensate, some tremor in voice noted.    PLAN:  1. Follow-up after surgery   Assessment/Plan   # Detail Type Description   1. Assessment Tremor (R25.1).         Pain Management Plan Pain Scale: 0/10. Method: Numeric Pain Intensity Scale. Location: back. Onset: 01/03/1991. Duration: varies. Quality: discomforting. Pain management follow-up plan of care: Patient taking medication as prescribed.  Fall Risk Plan The patient has not fallen in the last year.              Provider:  Loreena Valeri D. Steffany Schoenfelder MD  01/03/2019 05:36 PM Dictation edited by: Josh D. Slaydon    CC Providers: John  R[facility] [address] [place],  [place]  [id number]   Rebecca Tat  [address] [place], [place] [address]               Electronically signed by Chinaza Rooke D. Chiquitta Matty MD on 01/07/2019 12:47 PM   

## 2019-01-29 NOTE — H&P (Signed)
Patient ID:   [id number] Patient: Joshua [name]  Date of Birth: 10/21/1951 Visit Type: Office Visit   Date: 01/03/2019 08:30 AM Provider: Berdia Lachman D. Emry Barbato MD   This 67 year old male presents for Tremors.  HISTORY OF PRESENT ILLNESS:  1.  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  Patient notes 30 year history of tremor, increased significantly over the last few years, bilateral upper extremity, right greater than left. He denies any tremor in his voice but notes that he is beginning to have a head tremor.  Off Eliquis x1 year (following DVT/PE June 2018)  History:  OSA (CPAP), HTN, melanoma above left eye, GERD, IDDM, CVA x2(left retina, right parietal), gout, ischemic optic neuropathy left Surgical history:  Left TKR May 2018, vasectomy, appendectomy, tonsillectomy, bilateral cataract, nasal septum repair, left foot November 2019  MRI brain on [place]          PAST MEDICAL HISTORY, SURGICAL HISTORY, FAMILY HISTORY, SOCIAL HISTORY AND REVIEW OF SYSTEMS I have reviewed the patient's past medical, surgical, family and social history as well as the comprehensive review of systems as included on the [facility] history form dated 01/03/2019, which I have signed.   MEDICATIONS: (added, continued or stopped this visit) Started Medication Directions Instruction Stopped   amlodipine 5 mg tablet take 1 tablet by oral route  every day     aspirin 81 mg tablet,delayed release take 1 tablet by oral route  every day     docusate sodium 100 mg capsule take 1 capsule by oral route  every day at bedtime as needed     escitalopram 20 mg tablet take 1 tablet by oral route  every day     fenofibrate 54 mg tablet take 1 tablet by oral route  every day     Fish Oil 1,000 mg (120 mg-180 mg) capsule take 1 by oral route  every day     Humalog Junior KwikPen (U-100) 100 unit/mL subcutaneous  half-unit pen inject by subcutaneous route as per insulin protocol     hydrochlorothiazide 25 mg tablet take 1 tablet by oral route  every day     hydrocodone 5 mg-acetaminophen 325 mg tablet take 1 tablet by oral route  every 6 hours as needed for pain     losartan 100 mg tablet take 1 tablet by oral route  every day     lutein 25 mg-zeaxanthin 5 mg capsule      methocarbamol 500 mg tablet take 1 tablet by oral route 3 times every day     mupirocin 2 % topical ointment apply by topical route 2 times every day a small amount to the affected area     polyethylene glycol 3350 17 gram/dose oral powder take (17G)  by oral route  every day mixed with 8 oz. water, juice, soda, coffee or tea     primidone 50 mg tablet take 1 tablet by oral route 2 times every day     promethazine 25 mg tablet take 1 tablet by oral route  every 6 hours as needed     Silvadene 1 % topical cream apply by topical route 2 times every day a 1/16 inch (1.5 mm) thick layer to entire burn area     Toujeo Max U-300 SoloStar 300 unit/mL (3 mL) subcutaneous insulin pen inject by subcutaneous route as per insulin protocol       ALLERGIES:   Ingredient Reaction Medication Name Comment  NO KNOWN ALLERGIES     No known allergies.   REVIEW OF SYSTEMS   See scanned patient registration form, dated 01/03/2019, signed and dated on 01/03/2019  Review of Systems Details System Neg/Pos Details  Constitutional Negative Chills, Fatigue, Fever, Malaise, Night sweats, Weight gain and Weight loss.  ENMT Negative Ear drainage, Hearing loss, Nasal drainage, Otalgia, Sinus pressure and Sore throat.  Eyes Negative Eye discharge, Eye pain and Vision changes.  Respiratory Negative Chronic cough, Cough, Dyspnea, Known TB exposure and Wheezing.  Cardio Negative Chest pain, Claudication, Edema and Irregular heartbeat/palpitations.  GI Negative Abdominal pain, Blood in stool, Change in stool pattern, Constipation, Decreased appetite, Diarrhea,  Heartburn, Nausea and Vomiting.  GU Negative Dribbling, Dysuria, Erectile dysfunction, Hematuria, Polyuria (Genitourinary), Slow stream, Urinary frequency, Urinary incontinence and Urinary retention.  Endocrine Negative Cold intolerance, Heat intolerance, Polydipsia and Polyphagia.  Neuro Positive Tremors.  Psych Negative Anxiety, Depression and Insomnia.  Integumentary Negative Brittle hair, Brittle nails, Change in shape/size of mole(s), Hair loss, Hirsutism, Hives, Pruritus, Rash and Skin lesion.  MS Negative Back pain, Joint pain, Joint swelling, Muscle weakness and Neck pain.  Hema/Lymph Negative Easy bleeding, Easy bruising and Lymphadenopathy.  Allergic/Immuno Negative Contact allergy, Environmental allergies, Food allergies and Seasonal allergies.  Reproductive Negative Penile discharge and Sexual dysfunction.   PHYSICAL EXAM:   Vitals Date Temp F BP Pulse Ht In Wt Lb BMI BSA Pain Score  01/03/2019  163/81 69 76 277 33.72  0/10    PHYSICAL EXAM Details General Level of Distress: no acute distress Overall Appearance: normal  Head and Face  Right Left  Fundoscopic Exam:  normal normal    Cardiovascular Cardiac: regular rate and rhythm without murmur  Right Left  Carotid Pulses: normal normal  Respiratory Lungs: clear to auscultation  Neurological Orientation: normal Recent and Remote Memory: normal Attention Span and Concentration:   normal Language: normal Fund of Knowledge: normal  Right Left Sensation: normal normal Upper Extremity Coordination: normal normal  Lower Extremity Coordination: normal normal  Musculoskeletal Gait and Station: normal  Right Left Upper Extremity Muscle Strength: normal normal Lower Extremity Muscle Strength: normal normal Upper Extremity Muscle Tone:  normal normal Lower Extremity Muscle Tone: normal normal   Motor Strength Upper and lower extremity motor strength was tested in the clinically pertinent muscles.     Deep  Tendon Reflexes  Right Left Biceps: normal normal Triceps: normal normal Brachioradialis: normal normal Patellar: normal normal Achilles: normal normal  Cranial Nerves II. Optic Nerve/Visual Fields: normal III. Oculomotor: normal IV. Trochlear: normal V. Trigeminal: normal VI. Abducens: normal VII. Facial: normal VIII. Acoustic/Vestibular: normal IX. Glossopharyngeal: normal X. Vagus: normal XI. Spinal Accessory: normal XII. Hypoglossal: normal  Motor and other Tests Lhermittes: negative Rhomberg: negative Pronator drift: absent     Right Left Hoffman's: normal normal Clonus: normal normal Babinski: normal normal   Additional Findings:  Upon examination, right greater than left bilateral upper extremity tremor, central vision loss of the left eye, full strength in bilateral upper extremities, no rigidity, reflexes symmetric, titubating head tremor, patient has significant difficult eating or drinking but is able to compensate, some tremor in voice noted.    IMPRESSION:   Explained methodology of DBS/IPG implantation to patient and discussed this process at length with use of model. Advised patient that the surgery will take place in 3 stages and that the patient will be awake during one stage of the surgery. Discussed with patient that the IPG   will be need to be replaced at some point, the exact timing of the replacement is dependent upon the battery used. Advised patient that programming of the DBS will be completed by Dr. Tat approximately 6 weeks after all stages of surgery are completed.  Discussed risks of DBS/IPG placement including: death (low risk of this), infection (requires device to be removed), mechanical malfunctions of device, and side effects of the DBS. Recommended patient request video from Dr. Tat regarding DBS/IPG to obtain even more information about the process. Patient endorses that he wanted to proceed with DBS/IPG implantation and had no further  questions at this time. Patient will follow-up after DBS/ IPG implantation.  Upon examination, right greater than left bilateral upper extremity tremor, central vision loss of the left eye, full strength in bilateral upper extremities, no rigidity, reflexes symmetric, titubating head tremor, patient has significant difficult eating or drinking but is able to compensate, some tremor in voice noted.    PLAN:  1. Follow-up after surgery   Assessment/Plan   # Detail Type Description   1. Assessment Tremor (R25.1).         Pain Management Plan Pain Scale: 0/10. Method: Numeric Pain Intensity Scale. Location: back. Onset: 01/03/1991. Duration: varies. Quality: discomforting. Pain management follow-up plan of care: Patient taking medication as prescribed.  Fall Risk Plan The patient has not fallen in the last year.              Provider:  Noheli Melder D. Rowdy Guerrini MD  01/03/2019 05:36 PM Dictation edited by: Josh D. Slaydon    CC Providers: John  Russo [facility] [address] [place],  [place]  [id number]   Rebecca Tat  [address] [place], [place] [address]               Electronically signed by Sully Manzi D. Autym Siess MD on 01/07/2019 12:47 PM   

## 2019-02-07 ENCOUNTER — Telehealth: Payer: Self-pay | Admitting: Neurology

## 2019-02-07 NOTE — Telephone Encounter (Signed)
Dr. Vertell Limber asked me to call patient.  As suspected, [place] is asking that we r/s elective surgeries and Dr. Melven Sartorius office called patient and he was very upset.  Dr. Vertell Limber asked that I contact patient which I did but was unable to reach patient and just left a VM.  Pt identified himself on the VM.  Pt to call back with questions, but doesn't have to call back.

## 2019-02-07 NOTE — Telephone Encounter (Signed)
Spoke with patient on phone given surgeon general recommendations to cx elective surgeries.  He very much wants to proceed with surgery.  Elective surgeries are still on for now at [facility] but that situation may change in the near future.  Pt understands.  Does not wish to postpone surgery.

## 2019-02-08 ENCOUNTER — Telehealth: Payer: Self-pay | Admitting: Neurology

## 2019-02-08 NOTE — Telephone Encounter (Signed)
Patient called and left a message on my office voicemail (presumably because I had called him personally from my phone) and asked to questions.  He wanted to know if he should go back up on his primidone, 50 mg twice per day.  Jade, please let him know that he should go back on that.  He also wanted to know about rescheduling that surgery.  Please let the patient know that this is much too early to tell when [facility] will be rescheduling surgeries, but I do not schedule surgeries and that this will be up to Dr. Donald Pore office.  He can certainly feel free to call if he feels that the pandemic restrictions has been lifted and we have not rescheduled his surgery.  Tell him thank you for his patience and phone call.

## 2019-02-08 NOTE — Telephone Encounter (Signed)
Spoke with patient and made him aware of all these instructions. He will restart Primidone 50 mg - 1 daily for 3 days, then 1 BID.   He wanted me to let Dr. Carles Collet know that he was very touched that she took the time to call him personally and wanted to make sure I passed that message back to her.  Dr. Carles Collet Juluis Rainier.

## 2019-02-10 ENCOUNTER — Ambulatory Visit (HOSPITAL_COMMUNITY): Payer: Medicare Other

## 2019-02-10 ENCOUNTER — Encounter (HOSPITAL_COMMUNITY): Payer: Self-pay

## 2019-02-10 ENCOUNTER — Ambulatory Visit: Admit: 2019-02-10 | Payer: Medicare Other | Admitting: Neurosurgery

## 2019-02-10 SURGERY — MINOR PLACEMENT OF FIDUCIAL
Anesthesia: Monitor Anesthesia Care

## 2019-02-18 ENCOUNTER — Inpatient Hospital Stay: Admit: 2019-02-18 | Payer: Medicare Other | Admitting: Neurosurgery

## 2019-02-18 SURGERY — SUBTHALAMIC STIMULATOR INSERTION
Anesthesia: General | Laterality: Bilateral

## 2019-02-24 ENCOUNTER — Ambulatory Visit: Admit: 2019-02-24 | Payer: Medicare Other | Admitting: Neurosurgery

## 2019-02-24 SURGERY — BILATERAL PULSE GENERATOR IMPLANT
Anesthesia: General | Laterality: Bilateral

## 2019-03-03 ENCOUNTER — Telehealth: Payer: Medicare Other | Admitting: Podiatry

## 2019-03-15 ENCOUNTER — Telehealth: Payer: Self-pay | Admitting: Neurology

## 2019-03-15 NOTE — Telephone Encounter (Signed)
Patient was called to cancel his 2 DBS post op appointments. He is asking if we know when it would be rescheduled? Please Call. Thanks

## 2019-03-16 NOTE — Telephone Encounter (Signed)
That will be up to Dr. Vertell Limber.  Joshua [name] has already called their office to see if she can r/s

## 2019-03-28 ENCOUNTER — Encounter: Payer: Medicare Other | Admitting: Neurology

## 2019-03-31 ENCOUNTER — Telehealth: Payer: Self-pay | Admitting: Neurology

## 2019-03-31 NOTE — Telephone Encounter (Signed)
Patient is schedule for DBS on 8/6 & 8/13. This has been  confirmed with Dr. Melven Sartorius office and Tat's schedule has been blocked accordingly.

## 2019-03-31 NOTE — Telephone Encounter (Signed)
Patient called regarding him needing to fill out "something" online for the office. He was not able to do it before it expired. He would like you to call him with that information as well as possibly rescheduling his DBS surgery. Please Call. Thanks

## 2019-03-31 NOTE — Telephone Encounter (Signed)
Find out what needs filled out.  His DBS surgery has tentatively been rescheduled for August but he will need to get the details from Dr. Donald Pore office, as we have told him previously, since I am not the surgeon and don't have control over that.  At this point, elective surgeries at [facility] still are not open due to the pandemic.

## 2019-03-31 NOTE — Telephone Encounter (Signed)
Notified pt DBS surgery is been rescheduled in August--pt not sure what he need to fill out on line, but notified pt when someone will call  him for update scheduled for his surgery

## 2019-04-04 ENCOUNTER — Encounter: Payer: Medicare Other | Admitting: Neurology

## 2019-04-06 ENCOUNTER — Other Ambulatory Visit: Payer: Self-pay | Admitting: Neurosurgery

## 2019-05-23 ENCOUNTER — Ambulatory Visit (INDEPENDENT_AMBULATORY_CARE_PROVIDER_SITE_OTHER): Payer: Medicare Other

## 2019-05-23 ENCOUNTER — Ambulatory Visit: Payer: Medicare Other | Admitting: Podiatry

## 2019-05-23 ENCOUNTER — Other Ambulatory Visit: Payer: Self-pay

## 2019-05-23 ENCOUNTER — Encounter: Payer: Self-pay | Admitting: Podiatry

## 2019-05-23 VITALS — Temp 97.8°F

## 2019-05-23 DIAGNOSIS — M216X9 Other acquired deformities of unspecified foot: Secondary | ICD-10-CM

## 2019-05-23 DIAGNOSIS — L97519 Non-pressure chronic ulcer of other part of right foot with unspecified severity: Secondary | ICD-10-CM

## 2019-05-23 MED ORDER — CEPHALEXIN 500 MG PO CAPS: 500.0000 mg | ORAL_CAPSULE | Freq: Three times a day (TID) | ORAL | 0 refills | Status: DC

## 2019-05-23 NOTE — Progress Notes (Signed)
Subjective: 68 year old male presents the office today for concerns of a painful callus to the ball of his right foot, pointing to submetatarsal 5.  He states that he has noticed it open had some clear to bloody drainage but no pus.  Denies any redness or red streaks.  States he like to have surgery for this as this become a recurrent issue for him as well.  He does wear orthotics with offloading.Denies any systemic complaints such as fevers, chills, nausea, vomiting. No acute changes since last appointment, and no other complaints at this time.   Objective: AAO x3, NAD DP/PT pulses palpable bilaterally, CRT less than 3 seconds Prominent metatarsal heads plantarly.  On the right foot submetatarsal 5 a hyperkeratotic lesion with evidence of dried blood in the on debridement there is a superficial granular wound present.  There is no drainage or pus.  Minimal edema and faint surrounding erythema but there is no ascending cellulitis.  There is no fluctuation crepitation any malodor.  No other open lesions identified.  Tailor's bunion present No open lesions or pre-ulcerative lesions.  No pain with calf compression, swelling, warmth, erythema  Assessment: Ulceration right foot fifth metatarsal head  Plan: -All treatment options discussed with the patient including all alternatives, risks, complications.  -X-rays obtained reviewed.  Arthritic changes present fifth MPJ.  Metatarsal deformity of The fifth metatarsal from prior fracture.  No evidence of acute osteomyelitis or soft tissue emphysema -I debrided the callus revealed ongoing wound today with a #312 blade scalpel without any complications.  Recommend Betadine wet-to-dry dressing changes daily.  Prescribed Keflex.  Offloading. -Discussed fifth metatarsal head excision.  Only temporarily scheduling for surgery will discuss further next appointment. -He is scheduled for deep brain stimulator placement. Will make sure neurosurgery/neuology is  alright with surgery for the foot prior.  -Patient encouraged to call the office with any questions, concerns, change in symptoms.   Trula Slade DPM

## 2019-05-25 ENCOUNTER — Telehealth: Payer: Self-pay | Admitting: *Deleted

## 2019-05-25 ENCOUNTER — Telehealth: Payer: Self-pay | Admitting: Podiatry

## 2019-05-25 NOTE — Telephone Encounter (Signed)
Patient is scheduled for DBS surgery in August and the patient also mentioned that he had foot surgery scheduled for July as well. Dr. Vertell Limber would like to speak with Dr. Jacqualyn Posey in regards to the severity of his foot condition/surgery so that he can determine which surgery will need to be done first.   Please give him a call back.   Office- [phone] Cell- [phone] After 5PM

## 2019-05-25 NOTE — Telephone Encounter (Signed)
"  I saw Dr. Jacqualyn Posey on Monday and we tentatively scheduled foot surgery on my right foot.  I believe the date is July 22.  That was prior to some surgery I was going to have with Dr. Vertell Limber and it was quite honestly brain surgery.  We need to cancel that.  I don't know if they have gotten in contact with Dr. Jacqualyn Posey or not.  If you or Dr. Jacqualyn Posey would return my call, I'd appreciate it."

## 2019-05-25 NOTE — Telephone Encounter (Signed)
Sent message to Dr. Jacqualyn Posey. Lattie Haw

## 2019-05-25 NOTE — Telephone Encounter (Signed)
I called the patient and I also spoke with Dr. Vertell Limber. We will hold off on the foot surgery right now due to his upcoming neurosurgery. Will need to keep a watch to make sure the wound heals and no signs of infection prior to the brain surgery. I still want to see him back in 2 weeks and he understood. Encouraged to call with any questions or changes.

## 2019-05-25 NOTE — Telephone Encounter (Signed)
Dr. Jacqualyn Posey called the patient.

## 2019-06-06 ENCOUNTER — Encounter: Payer: Self-pay | Admitting: Podiatry

## 2019-06-06 ENCOUNTER — Other Ambulatory Visit: Payer: Self-pay

## 2019-06-06 ENCOUNTER — Ambulatory Visit: Payer: Medicare Other | Admitting: Podiatry

## 2019-06-06 VITALS — Temp 97.8°F

## 2019-06-06 DIAGNOSIS — L97519 Non-pressure chronic ulcer of other part of right foot with unspecified severity: Secondary | ICD-10-CM

## 2019-06-06 DIAGNOSIS — M216X9 Other acquired deformities of unspecified foot: Secondary | ICD-10-CM | POA: Diagnosis not present

## 2019-06-08 NOTE — Progress Notes (Signed)
Subjective: 68 year old male presents the office today for follow-up evaluation of an ulcer to his right foot.  He states that he is doing much better is no longer having any pain.  He denies any swelling or redness or any drainage or pus.  He finished the course of antibiotics.  He has been keeping a bandage over the area daily. Denies any systemic complaints such as fevers, chills, nausea, vomiting. No acute changes since last appointment, and no other complaints at this time.   Objective: AAO x3, NAD DP/PT pulses palpable bilaterally, CRT less than 3 seconds Prominent metatarsal heads plantarly.  On the right foot submetatarsal 5 over the area the previous wound is hyperkeratotic tissue.  Upon debridement the area is healed.  There is no drainage or pus there is no surrounding edema, erythema, ascending cellulitis.  There is no fluctuation crepitation any malodor.  No other open lesions identified at this time. No open lesions or pre-ulcerative lesions.  No pain with calf compression, swelling, warmth, erythema  Assessment: Ulceration right foot fifth metatarsal head  Plan: -All treatment options discussed with the patient including all alternatives, risks, complications.  -Debrided the hyperkeratotic lesion today with any complications or bleeding.  The wound is healed.  Recommend moisturizer daily.  Continue offloading.  Monitor for any reoccurrence.  Will hold off on any surgical intervention at this time given his upcoming brain surgery. -Patient encouraged to call the office with any questions, concerns, change in symptoms.   Trula Slade DPM

## 2019-06-13 ENCOUNTER — Telehealth: Payer: Self-pay | Admitting: Neurology

## 2019-06-13 NOTE — Telephone Encounter (Signed)
Follow up  Patient returning nurses call

## 2019-06-13 NOTE — Telephone Encounter (Signed)
Patient has some questions regarding the DBS surgery. He was asking if he needs another appt before surgery and also heard about [facility] surgery needing him to have testing. Please call him back at [phone]. Thanks!

## 2019-06-13 NOTE — Telephone Encounter (Signed)
Called patient no answer left message to call me back

## 2019-06-14 ENCOUNTER — Other Ambulatory Visit: Payer: Self-pay | Admitting: Neurosurgery

## 2019-06-14 NOTE — Telephone Encounter (Signed)
He would need to call Dr. Melven Sartorius office to find out about Covid testing since all neurosurgery instructions should come from his office.  I don't need to see him unless he would like to see/talk to me (which is very reasonable)  before surgery since it has been a while, since surgery postponed due to covid.  If he does want to, can put him on virtual visit on 8/5 or 8/7 and he and I can do video visit for questions.  I know his fiducials are 8/6

## 2019-06-14 NOTE — Telephone Encounter (Signed)
Spoke with patient he would like to know if he needs to follow up with Dr. Carles Collet, or the surgeon prior to surgery date ? He also would like to know if he would need to get to tested for Covid 19 prior to surgery. He saw a commercial on television  from [facility] that all patient having surgery would need to be tested before surgery.  Surgery dates below 06/30/19 is the first surgery 07/07/19, 07/14/19  Please advise

## 2019-06-14 NOTE — Telephone Encounter (Signed)
Called patient no answer left this message on patient voice mail. He can call office back if he would like to schedule a video visit appt with Dr. Carles Collet

## 2019-06-27 ENCOUNTER — Ambulatory Visit: Payer: Medicare Other | Admitting: Podiatry

## 2019-06-27 ENCOUNTER — Other Ambulatory Visit (HOSPITAL_COMMUNITY)
Admission: RE | Admit: 2019-06-27 | Discharge: 2019-06-27 | Disposition: A | Discharge status: Home / Self Care | Payer: Medicare Other | Source: Ambulatory Visit | Attending: Neurosurgery | Admitting: Neurosurgery

## 2019-06-27 DIAGNOSIS — Z01812 Encounter for preprocedural laboratory examination: Secondary | ICD-10-CM | POA: Insufficient documentation

## 2019-06-27 DIAGNOSIS — Z20828 Contact with and (suspected) exposure to other viral communicable diseases: Secondary | ICD-10-CM | POA: Insufficient documentation

## 2019-06-27 LAB — SARS CORONAVIRUS 2 (TAT 6-24 HRS): SARS Coronavirus 2: NEGATIVE

## 2019-06-27 NOTE — H&P (Signed)
Patient ID:   [id number] Patient: Joshua [name]  Date of Birth: 1951-10-04 Visit Type: Office Visit   Date: 01/03/2019 08:30 AM Provider: Marchia Meiers. Vertell Limber MD   This 68 year old male presents for Tremors.  HISTORY OF PRESENT ILLNESS:  1.  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  Patient notes 30 year history of tremor, increased significantly over the last few years, bilateral upper extremity, right greater than left. He denies any tremor in his voice but notes that he is beginning to have a head tremor.  Off Eliquis x1 year (following DVT/PE June 2018)  History:  OSA (CPAP), HTN, melanoma above left eye, GERD, IDDM, CVA x2(left retina, right parietal), gout, ischemic optic neuropathy left Surgical history:  Left TKR May 2018, vasectomy, appendectomy, tonsillectomy, bilateral cataract, nasal septum repair, left foot November 2019  MRI brain on [place]          PAST MEDICAL HISTORY, SURGICAL HISTORY, FAMILY HISTORY, SOCIAL HISTORY AND REVIEW OF SYSTEMS I have reviewed the patient's past medical, surgical, family and social history as well as the comprehensive review of systems as included on the [facility] history form dated 01/03/2019, which I have signed.   MEDICATIONS: (added, continued or stopped this visit) Started Medication Directions Instruction Stopped   amlodipine 5 mg tablet take 1 tablet by oral route  every day     aspirin 81 mg tablet,delayed release take 1 tablet by oral route  every day     docusate sodium 100 mg capsule take 1 capsule by oral route  every day at bedtime as needed     escitalopram 20 mg tablet take 1 tablet by oral route  every day     fenofibrate 54 mg tablet take 1 tablet by oral route  every day     Fish Oil 1,000 mg (120 mg-180 mg) capsule take 1 by oral route  every day     Humalog Junior KwikPen (U-100) 100 unit/mL subcutaneous  half-unit pen inject by subcutaneous route as per insulin protocol     hydrochlorothiazide 25 mg tablet take 1 tablet by oral route  every day     hydrocodone 5 mg-acetaminophen 325 mg tablet take 1 tablet by oral route  every 6 hours as needed for pain     losartan 100 mg tablet take 1 tablet by oral route  every day     lutein 25 mg-zeaxanthin 5 mg capsule      methocarbamol 500 mg tablet take 1 tablet by oral route 3 times every day     mupirocin 2 % topical ointment apply by topical route 2 times every day a small amount to the affected area     polyethylene glycol 3350 17 gram/dose oral powder take (17G)  by oral route  every day mixed with 8 oz. water, juice, soda, coffee or tea     primidone 50 mg tablet take 1 tablet by oral route 2 times every day     promethazine 25 mg tablet take 1 tablet by oral route  every 6 hours as needed     Silvadene 1 % topical cream apply by topical route 2 times every day a 1/16 inch (1.5 mm) thick layer to entire burn area     Toujeo Max U-300 SoloStar 300 unit/mL (3 mL) subcutaneous insulin pen inject by subcutaneous route as per insulin protocol       ALLERGIES:  Ingredient Reaction Medication Name Comment  NO KNOWN ALLERGIES     No known allergies.   REVIEW OF SYSTEMS   See scanned patient registration form, dated 01/03/2019, signed and dated on 01/03/2019  Review of Systems Details System Neg/Pos Details  Constitutional Negative Chills, Fatigue, Fever, Malaise, Night sweats, Weight gain and Weight loss.  ENMT Negative Ear drainage, Hearing loss, Nasal drainage, Otalgia, Sinus pressure and Sore throat.  Eyes Negative Eye discharge, Eye pain and Vision changes.  Respiratory Negative Chronic cough, Cough, Dyspnea, Known TB exposure and Wheezing.  Cardio Negative Chest pain, Claudication, Edema and Irregular heartbeat/palpitations.  GI Negative Abdominal pain, Blood in stool, Change in stool pattern, Constipation, Decreased appetite, Diarrhea,  Heartburn, Nausea and Vomiting.  GU Negative Dribbling, Dysuria, Erectile dysfunction, Hematuria, Polyuria (Genitourinary), Slow stream, Urinary frequency, Urinary incontinence and Urinary retention.  Endocrine Negative Cold intolerance, Heat intolerance, Polydipsia and Polyphagia.  Neuro Positive Tremors.  Psych Negative Anxiety, Depression and Insomnia.  Integumentary Negative Brittle hair, Brittle nails, Change in shape/size of mole(s), Hair loss, Hirsutism, Hives, Pruritus, Rash and Skin lesion.  MS Negative Back pain, Joint pain, Joint swelling, Muscle weakness and Neck pain.  Hema/Lymph Negative Easy bleeding, Easy bruising and Lymphadenopathy.  Allergic/Immuno Negative Contact allergy, Environmental allergies, Food allergies and Seasonal allergies.  Reproductive Negative Penile discharge and Sexual dysfunction.   PHYSICAL EXAM:   Vitals Date Temp F BP Pulse Ht In Wt Lb BMI BSA Pain Score  01/03/2019  163/81 69 76 277 33.72  0/10    PHYSICAL EXAM Details General Level of Distress: no acute distress Overall Appearance: normal  Head and Face  Right Left  Fundoscopic Exam:  normal normal    Cardiovascular Cardiac: regular rate and rhythm without murmur  Right Left  Carotid Pulses: normal normal  Respiratory Lungs: clear to auscultation  Neurological Orientation: normal Recent and Remote Memory: normal Attention Span and Concentration:   normal Language: normal Fund of Knowledge: normal  Right Left Sensation: normal normal Upper Extremity Coordination: normal normal  Lower Extremity Coordination: normal normal  Musculoskeletal Gait and Station: normal  Right Left Upper Extremity Muscle Strength: normal normal Lower Extremity Muscle Strength: normal normal Upper Extremity Muscle Tone:  normal normal Lower Extremity Muscle Tone: normal normal   Motor Strength Upper and lower extremity motor strength was tested in the clinically pertinent muscles.     Deep  Tendon Reflexes  Right Left Biceps: normal normal Triceps: normal normal Brachioradialis: normal normal Patellar: normal normal Achilles: normal normal  Cranial Nerves II. Optic Nerve/Visual Fields: normal III. Oculomotor: normal IV. Trochlear: normal V. Trigeminal: normal VI. Abducens: normal VII. Facial: normal VIII. Acoustic/Vestibular: normal IX. Glossopharyngeal: normal X. Vagus: normal XI. Spinal Accessory: normal XII. Hypoglossal: normal  Motor and other Tests Lhermittes: negative Rhomberg: negative Pronator drift: absent     Right Left Hoffman's: normal normal Clonus: normal normal Babinski: normal normal   Additional Findings:  Upon examination, right greater than left bilateral upper extremity tremor, central vision loss of the left eye, full strength in bilateral upper extremities, no rigidity, reflexes symmetric, titubating head tremor, patient has significant difficult eating or drinking but is able to compensate, some tremor in voice noted.    IMPRESSION:   Explained methodology of DBS/IPG implantation to patient and discussed this process at length with use of model. Advised patient that the surgery will take place in 3 stages and that the patient will be awake during one stage of the surgery. Discussed with patient that the IPG  will be need to be replaced at some point, the exact timing of the replacement is dependent upon the battery used. Advised patient that programming of the DBS will be completed by Dr. Carles Collet approximately 6 weeks after all stages of surgery are completed.  Discussed risks of DBS/IPG placement including: death (low risk of this), infection (requires device to be removed), mechanical malfunctions of device, and side effects of the DBS. Recommended patient request video from Dr. Carles Collet regarding DBS/IPG to obtain even more information about the process. Patient endorses that he wanted to proceed with DBS/IPG implantation and had no further  questions at this time. Patient will follow-up after DBS/ IPG implantation.  Upon examination, right greater than left bilateral upper extremity tremor, central vision loss of the left eye, full strength in bilateral upper extremities, no rigidity, reflexes symmetric, titubating head tremor, patient has significant difficult eating or drinking but is able to compensate, some tremor in voice noted.    PLAN:  1. Follow-up after surgery   Assessment/Plan   # Detail Type Description   1. Assessment Tremor (R25.1).         Pain Management Plan Pain Scale: 0/10. Method: Numeric Pain Intensity Scale. Location: back. Onset: 01/03/1991. Duration: varies. Quality: discomforting. Pain management follow-up plan of care: Patient taking medication as prescribed.  Fall Risk Plan The patient has not fallen in the last year.              Provider:  Marchia Meiers. Vertell Limber MD  01/03/2019 05:36 PM Dictation edited by: Mirian Mo    CC Providers: [name] [facility] [address] [place],  [place]  [id number]   Rebecca Tat  [address] [place], [place] [address]               Electronically signed by Marchia Meiers. Vertell Limber MD on 01/07/2019 12:47 PM

## 2019-06-27 NOTE — H&P (View-Only) (Signed)
Patient ID:   [id number] Patient: Joshua [name]  Date of Birth: 1951/10/05 Visit Type: Office Visit   Date: 01/03/2019 08:30 AM Provider: Marchia Meiers. Vertell Limber MD   This 68 year old male presents for Tremors.  HISTORY OF PRESENT ILLNESS:  1.  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  Patient notes 30 year history of tremor, increased significantly over the last few years, bilateral upper extremity, right greater than left. He denies any tremor in his voice but notes that he is beginning to have a head tremor.  Off Eliquis x1 year (following DVT/PE June 2018)  History:  OSA (CPAP), HTN, melanoma above left eye, GERD, IDDM, CVA x2(left retina, right parietal), gout, ischemic optic neuropathy left Surgical history:  Left TKR May 2018, vasectomy, appendectomy, tonsillectomy, bilateral cataract, nasal septum repair, left foot November 2019  MRI brain on [place]          PAST MEDICAL HISTORY, SURGICAL HISTORY, FAMILY HISTORY, SOCIAL HISTORY AND REVIEW OF SYSTEMS I have reviewed the patient's past medical, surgical, family and social history as well as the comprehensive review of systems as included on the [facility] history form dated 01/03/2019, which I have signed.   MEDICATIONS: (added, continued or stopped this visit) Started Medication Directions Instruction Stopped   amlodipine 5 mg tablet take 1 tablet by oral route  every day     aspirin 81 mg tablet,delayed release take 1 tablet by oral route  every day     docusate sodium 100 mg capsule take 1 capsule by oral route  every day at bedtime as needed     escitalopram 20 mg tablet take 1 tablet by oral route  every day     fenofibrate 54 mg tablet take 1 tablet by oral route  every day     Fish Oil 1,000 mg (120 mg-180 mg) capsule take 1 by oral route  every day     Humalog Junior KwikPen (U-100) 100 unit/mL subcutaneous  half-unit pen inject by subcutaneous route as per insulin protocol     hydrochlorothiazide 25 mg tablet take 1 tablet by oral route  every day     hydrocodone 5 mg-acetaminophen 325 mg tablet take 1 tablet by oral route  every 6 hours as needed for pain     losartan 100 mg tablet take 1 tablet by oral route  every day     lutein 25 mg-zeaxanthin 5 mg capsule      methocarbamol 500 mg tablet take 1 tablet by oral route 3 times every day     mupirocin 2 % topical ointment apply by topical route 2 times every day a small amount to the affected area     polyethylene glycol 3350 17 gram/dose oral powder take (17G)  by oral route  every day mixed with 8 oz. water, juice, soda, coffee or tea     primidone 50 mg tablet take 1 tablet by oral route 2 times every day     promethazine 25 mg tablet take 1 tablet by oral route  every 6 hours as needed     Silvadene 1 % topical cream apply by topical route 2 times every day a 1/16 inch (1.5 mm) thick layer to entire burn area     Toujeo Max U-300 SoloStar 300 unit/mL (3 mL) subcutaneous insulin pen inject by subcutaneous route as per insulin protocol       ALLERGIES:  Ingredient Reaction Medication Name Comment  NO KNOWN ALLERGIES     No known allergies.   REVIEW OF SYSTEMS   See scanned patient registration form, dated 01/03/2019, signed and dated on 01/03/2019  Review of Systems Details System Neg/Pos Details  Constitutional Negative Chills, Fatigue, Fever, Malaise, Night sweats, Weight gain and Weight loss.  ENMT Negative Ear drainage, Hearing loss, Nasal drainage, Otalgia, Sinus pressure and Sore throat.  Eyes Negative Eye discharge, Eye pain and Vision changes.  Respiratory Negative Chronic cough, Cough, Dyspnea, Known TB exposure and Wheezing.  Cardio Negative Chest pain, Claudication, Edema and Irregular heartbeat/palpitations.  GI Negative Abdominal pain, Blood in stool, Change in stool pattern, Constipation, Decreased appetite, Diarrhea,  Heartburn, Nausea and Vomiting.  GU Negative Dribbling, Dysuria, Erectile dysfunction, Hematuria, Polyuria (Genitourinary), Slow stream, Urinary frequency, Urinary incontinence and Urinary retention.  Endocrine Negative Cold intolerance, Heat intolerance, Polydipsia and Polyphagia.  Neuro Positive Tremors.  Psych Negative Anxiety, Depression and Insomnia.  Integumentary Negative Brittle hair, Brittle nails, Change in shape/size of mole(s), Hair loss, Hirsutism, Hives, Pruritus, Rash and Skin lesion.  MS Negative Back pain, Joint pain, Joint swelling, Muscle weakness and Neck pain.  Hema/Lymph Negative Easy bleeding, Easy bruising and Lymphadenopathy.  Allergic/Immuno Negative Contact allergy, Environmental allergies, Food allergies and Seasonal allergies.  Reproductive Negative Penile discharge and Sexual dysfunction.   PHYSICAL EXAM:   Vitals Date Temp F BP Pulse Ht In Wt Lb BMI BSA Pain Score  01/03/2019  163/81 69 76 277 33.72  0/10    PHYSICAL EXAM Details General Level of Distress: no acute distress Overall Appearance: normal  Head and Face  Right Left  Fundoscopic Exam:  normal normal    Cardiovascular Cardiac: regular rate and rhythm without murmur  Right Left  Carotid Pulses: normal normal  Respiratory Lungs: clear to auscultation  Neurological Orientation: normal Recent and Remote Memory: normal Attention Span and Concentration:   normal Language: normal Fund of Knowledge: normal  Right Left Sensation: normal normal Upper Extremity Coordination: normal normal  Lower Extremity Coordination: normal normal  Musculoskeletal Gait and Station: normal  Right Left Upper Extremity Muscle Strength: normal normal Lower Extremity Muscle Strength: normal normal Upper Extremity Muscle Tone:  normal normal Lower Extremity Muscle Tone: normal normal   Motor Strength Upper and lower extremity motor strength was tested in the clinically pertinent muscles.     Deep  Tendon Reflexes  Right Left Biceps: normal normal Triceps: normal normal Brachioradialis: normal normal Patellar: normal normal Achilles: normal normal  Cranial Nerves II. Optic Nerve/Visual Fields: normal III. Oculomotor: normal IV. Trochlear: normal V. Trigeminal: normal VI. Abducens: normal VII. Facial: normal VIII. Acoustic/Vestibular: normal IX. Glossopharyngeal: normal X. Vagus: normal XI. Spinal Accessory: normal XII. Hypoglossal: normal  Motor and other Tests Lhermittes: negative Rhomberg: negative Pronator drift: absent     Right Left Hoffman's: normal normal Clonus: normal normal Babinski: normal normal   Additional Findings:  Upon examination, right greater than left bilateral upper extremity tremor, central vision loss of the left eye, full strength in bilateral upper extremities, no rigidity, reflexes symmetric, titubating head tremor, patient has significant difficult eating or drinking but is able to compensate, some tremor in voice noted.    IMPRESSION:   Explained methodology of DBS/IPG implantation to patient and discussed this process at length with use of model. Advised patient that the surgery will take place in 3 stages and that the patient will be awake during one stage of the surgery. Discussed with patient that the IPG  will be need to be replaced at some point, the exact timing of the replacement is dependent upon the battery used. Advised patient that programming of the DBS will be completed by Dr. Carles Collet approximately 6 weeks after all stages of surgery are completed.  Discussed risks of DBS/IPG placement including: death (low risk of this), infection (requires device to be removed), mechanical malfunctions of device, and side effects of the DBS. Recommended patient request video from Dr. Carles Collet regarding DBS/IPG to obtain even more information about the process. Patient endorses that he wanted to proceed with DBS/IPG implantation and had no further  questions at this time. Patient will follow-up after DBS/ IPG implantation.  Upon examination, right greater than left bilateral upper extremity tremor, central vision loss of the left eye, full strength in bilateral upper extremities, no rigidity, reflexes symmetric, titubating head tremor, patient has significant difficult eating or drinking but is able to compensate, some tremor in voice noted.    PLAN:  1. Follow-up after surgery   Assessment/Plan   # Detail Type Description   1. Assessment Tremor (R25.1).         Pain Management Plan Pain Scale: 0/10. Method: Numeric Pain Intensity Scale. Location: back. Onset: 01/03/1991. Duration: varies. Quality: discomforting. Pain management follow-up plan of care: Patient taking medication as prescribed.  Fall Risk Plan The patient has not fallen in the last year.              Provider:  Marchia Meiers. Vertell Limber MD  01/03/2019 05:36 PM Dictation edited by: Mirian Mo    CC Providers: [name] [facility] [address] [place],  [place]  [id number]-   Rebecca Tat  [address] [place], [place] [address]               Electronically signed by Marchia Meiers. Vertell Limber MD on 01/07/2019 12:47 PM

## 2019-06-27 NOTE — H&P (View-Only) (Signed)
Patient ID:   [id number] Patient: Joshua [name]  Date of Birth: 1951/03/18 Visit Type: Office Visit   Date: 01/03/2019 08:30 AM Provider: Marchia Meiers. Vertell Limber MD   This 68 year old male presents for Tremors.  HISTORY OF PRESENT ILLNESS:  1.  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  Patient notes 30 year history of tremor, increased significantly over the last few years, bilateral upper extremity, right greater than left. He denies any tremor in his voice but notes that he is beginning to have a head tremor.  Off Eliquis x1 year (following DVT/PE June 2018)  History:  OSA (CPAP), HTN, melanoma above left eye, GERD, IDDM, CVA x2(left retina, right parietal), gout, ischemic optic neuropathy left Surgical history:  Left TKR May 2018, vasectomy, appendectomy, tonsillectomy, bilateral cataract, nasal septum repair, left foot November 2019  MRI brain on [place]          PAST MEDICAL HISTORY, SURGICAL HISTORY, FAMILY HISTORY, SOCIAL HISTORY AND REVIEW OF SYSTEMS I have reviewed the patient's past medical, surgical, family and social history as well as the comprehensive review of systems as included on the [facility] history form dated 01/03/2019, which I have signed.   MEDICATIONS: (added, continued or stopped this visit) Started Medication Directions Instruction Stopped   amlodipine 5 mg tablet take 1 tablet by oral route  every day     aspirin 81 mg tablet,delayed release take 1 tablet by oral route  every day     docusate sodium 100 mg capsule take 1 capsule by oral route  every day at bedtime as needed     escitalopram 20 mg tablet take 1 tablet by oral route  every day     fenofibrate 54 mg tablet take 1 tablet by oral route  every day     Fish Oil 1,000 mg (120 mg-180 mg) capsule take 1 by oral route  every day     Humalog Junior KwikPen (U-100) 100 unit/mL subcutaneous  half-unit pen inject by subcutaneous route as per insulin protocol     hydrochlorothiazide 25 mg tablet take 1 tablet by oral route  every day     hydrocodone 5 mg-acetaminophen 325 mg tablet take 1 tablet by oral route  every 6 hours as needed for pain     losartan 100 mg tablet take 1 tablet by oral route  every day     lutein 25 mg-zeaxanthin 5 mg capsule      methocarbamol 500 mg tablet take 1 tablet by oral route 3 times every day     mupirocin 2 % topical ointment apply by topical route 2 times every day a small amount to the affected area     polyethylene glycol 3350 17 gram/dose oral powder take (17G)  by oral route  every day mixed with 8 oz. water, juice, soda, coffee or tea     primidone 50 mg tablet take 1 tablet by oral route 2 times every day     promethazine 25 mg tablet take 1 tablet by oral route  every 6 hours as needed     Silvadene 1 % topical cream apply by topical route 2 times every day a 1/16 inch (1.5 mm) thick layer to entire burn area     Toujeo Max U-300 SoloStar 300 unit/mL (3 mL) subcutaneous insulin pen inject by subcutaneous route as per insulin protocol       ALLERGIES:  Ingredient Reaction Medication Name Comment  NO KNOWN ALLERGIES     No known allergies.   REVIEW OF SYSTEMS   See scanned patient registration form, dated 01/03/2019, signed and dated on 01/03/2019  Review of Systems Details System Neg/Pos Details  Constitutional Negative Chills, Fatigue, Fever, Malaise, Night sweats, Weight gain and Weight loss.  ENMT Negative Ear drainage, Hearing loss, Nasal drainage, Otalgia, Sinus pressure and Sore throat.  Eyes Negative Eye discharge, Eye pain and Vision changes.  Respiratory Negative Chronic cough, Cough, Dyspnea, Known TB exposure and Wheezing.  Cardio Negative Chest pain, Claudication, Edema and Irregular heartbeat/palpitations.  GI Negative Abdominal pain, Blood in stool, Change in stool pattern, Constipation, Decreased appetite, Diarrhea,  Heartburn, Nausea and Vomiting.  GU Negative Dribbling, Dysuria, Erectile dysfunction, Hematuria, Polyuria (Genitourinary), Slow stream, Urinary frequency, Urinary incontinence and Urinary retention.  Endocrine Negative Cold intolerance, Heat intolerance, Polydipsia and Polyphagia.  Neuro Positive Tremors.  Psych Negative Anxiety, Depression and Insomnia.  Integumentary Negative Brittle hair, Brittle nails, Change in shape/size of mole(s), Hair loss, Hirsutism, Hives, Pruritus, Rash and Skin lesion.  MS Negative Back pain, Joint pain, Joint swelling, Muscle weakness and Neck pain.  Hema/Lymph Negative Easy bleeding, Easy bruising and Lymphadenopathy.  Allergic/Immuno Negative Contact allergy, Environmental allergies, Food allergies and Seasonal allergies.  Reproductive Negative Penile discharge and Sexual dysfunction.   PHYSICAL EXAM:   Vitals Date Temp F BP Pulse Ht In Wt Lb BMI BSA Pain Score  01/03/2019  163/81 69 76 277 33.72  0/10    PHYSICAL EXAM Details General Level of Distress: no acute distress Overall Appearance: normal  Head and Face  Right Left  Fundoscopic Exam:  normal normal    Cardiovascular Cardiac: regular rate and rhythm without murmur  Right Left  Carotid Pulses: normal normal  Respiratory Lungs: clear to auscultation  Neurological Orientation: normal Recent and Remote Memory: normal Attention Span and Concentration:   normal Language: normal Fund of Knowledge: normal  Right Left Sensation: normal normal Upper Extremity Coordination: normal normal  Lower Extremity Coordination: normal normal  Musculoskeletal Gait and Station: normal  Right Left Upper Extremity Muscle Strength: normal normal Lower Extremity Muscle Strength: normal normal Upper Extremity Muscle Tone:  normal normal Lower Extremity Muscle Tone: normal normal   Motor Strength Upper and lower extremity motor strength was tested in the clinically pertinent muscles.     Deep  Tendon Reflexes  Right Left Biceps: normal normal Triceps: normal normal Brachioradialis: normal normal Patellar: normal normal Achilles: normal normal  Cranial Nerves II. Optic Nerve/Visual Fields: normal III. Oculomotor: normal IV. Trochlear: normal V. Trigeminal: normal VI. Abducens: normal VII. Facial: normal VIII. Acoustic/Vestibular: normal IX. Glossopharyngeal: normal X. Vagus: normal XI. Spinal Accessory: normal XII. Hypoglossal: normal  Motor and other Tests Lhermittes: negative Rhomberg: negative Pronator drift: absent     Right Left Hoffman's: normal normal Clonus: normal normal Babinski: normal normal   Additional Findings:  Upon examination, right greater than left bilateral upper extremity tremor, central vision loss of the left eye, full strength in bilateral upper extremities, no rigidity, reflexes symmetric, titubating head tremor, patient has significant difficult eating or drinking but is able to compensate, some tremor in voice noted.    IMPRESSION:   Explained methodology of DBS/IPG implantation to patient and discussed this process at length with use of model. Advised patient that the surgery will take place in 3 stages and that the patient will be awake during one stage of the surgery. Discussed with patient that the IPG  will be need to be replaced at some point, the exact timing of the replacement is dependent upon the battery used. Advised patient that programming of the DBS will be completed by Dr. Carles Collet approximately 6 weeks after all stages of surgery are completed.  Discussed risks of DBS/IPG placement including: death (low risk of this), infection (requires device to be removed), mechanical malfunctions of device, and side effects of the DBS. Recommended patient request video from Dr. Carles Collet regarding DBS/IPG to obtain even more information about the process. Patient endorses that he wanted to proceed with DBS/IPG implantation and had no further  questions at this time. Patient will follow-up after DBS/ IPG implantation.  Upon examination, right greater than left bilateral upper extremity tremor, central vision loss of the left eye, full strength in bilateral upper extremities, no rigidity, reflexes symmetric, titubating head tremor, patient has significant difficult eating or drinking but is able to compensate, some tremor in voice noted.    PLAN:  1. Follow-up after surgery   Assessment/Plan   # Detail Type Description   1. Assessment Tremor (R25.1).         Pain Management Plan Pain Scale: 0/10. Method: Numeric Pain Intensity Scale. Location: back. Onset: 01/03/1991. Duration: varies. Quality: discomforting. Pain management follow-up plan of care: Patient taking medication as prescribed.  Fall Risk Plan The patient has not fallen in the last year.              Provider:  Marchia Meiers. Vertell Limber MD  01/03/2019 05:36 PM Dictation edited by: Mirian Mo    CC Providers: [name] [facility] [address] [place],  [place]  [id number]-   Rebecca Tat  [address] [place], [place] [address]               Electronically signed by Marchia Meiers. Vertell Limber MD on 01/07/2019 12:47 PM

## 2019-06-30 ENCOUNTER — Ambulatory Visit (HOSPITAL_COMMUNITY)
Admission: RE | Admit: 2019-06-30 | Discharge: 2019-06-30 | Disposition: A | Discharge status: Home / Self Care | Payer: Medicare Other | Source: Ambulatory Visit | Attending: Neurosurgery | Admitting: Neurosurgery

## 2019-06-30 ENCOUNTER — Other Ambulatory Visit: Payer: Self-pay

## 2019-06-30 ENCOUNTER — Encounter (HOSPITAL_COMMUNITY): Payer: Self-pay

## 2019-06-30 ENCOUNTER — Ambulatory Visit (HOSPITAL_COMMUNITY)
Admission: RE | Admit: 2019-06-30 | Discharge: 2019-06-30 | Disposition: A | Discharge status: Home / Self Care | Payer: Medicare Other | Attending: Neurosurgery | Admitting: Neurosurgery

## 2019-06-30 ENCOUNTER — Encounter (HOSPITAL_COMMUNITY)
Admission: RE | Disposition: A | Discharge status: Home / Self Care | Payer: Self-pay | Source: Home / Self Care | Attending: Neurosurgery

## 2019-06-30 ENCOUNTER — Telehealth: Payer: Self-pay | Admitting: Neurology

## 2019-06-30 DIAGNOSIS — E119 Type 2 diabetes mellitus without complications: Secondary | ICD-10-CM | POA: Diagnosis not present

## 2019-06-30 DIAGNOSIS — Z85820 Personal history of malignant melanoma of skin: Secondary | ICD-10-CM | POA: Insufficient documentation

## 2019-06-30 DIAGNOSIS — Z86718 Personal history of other venous thrombosis and embolism: Secondary | ICD-10-CM | POA: Insufficient documentation

## 2019-06-30 DIAGNOSIS — Z794 Long term (current) use of insulin: Secondary | ICD-10-CM | POA: Insufficient documentation

## 2019-06-30 DIAGNOSIS — Z79899 Other long term (current) drug therapy: Secondary | ICD-10-CM | POA: Insufficient documentation

## 2019-06-30 DIAGNOSIS — I10 Essential (primary) hypertension: Secondary | ICD-10-CM | POA: Diagnosis not present

## 2019-06-30 DIAGNOSIS — G4733 Obstructive sleep apnea (adult) (pediatric): Secondary | ICD-10-CM | POA: Insufficient documentation

## 2019-06-30 DIAGNOSIS — Z8673 Personal history of transient ischemic attack (TIA), and cerebral infarction without residual deficits: Secondary | ICD-10-CM | POA: Insufficient documentation

## 2019-06-30 DIAGNOSIS — R251 Tremor, unspecified: Secondary | ICD-10-CM | POA: Diagnosis not present

## 2019-06-30 DIAGNOSIS — Z7982 Long term (current) use of aspirin: Secondary | ICD-10-CM | POA: Insufficient documentation

## 2019-06-30 DIAGNOSIS — Z86711 Personal history of pulmonary embolism: Secondary | ICD-10-CM | POA: Insufficient documentation

## 2019-06-30 DIAGNOSIS — G250 Essential tremor: Secondary | ICD-10-CM

## 2019-06-30 DIAGNOSIS — Z96652 Presence of left artificial knee joint: Secondary | ICD-10-CM | POA: Insufficient documentation

## 2019-06-30 HISTORY — PX: MINOR PLACEMENT OF FIDUCIAL: SHX6748

## 2019-06-30 SURGERY — MINOR PLACEMENT OF FIDUCIAL
Anesthesia: LOCAL

## 2019-06-30 MED ORDER — LIDOCAINE HCL (PF) 1 % IJ SOLN
INTRAMUSCULAR | Status: AC
  Filled 2019-06-30: qty 30

## 2019-06-30 MED ORDER — LIDOCAINE-EPINEPHRINE 1 %-1:100000 IJ SOLN
INTRAMUSCULAR | Status: AC
  Filled 2019-06-30: qty 1

## 2019-06-30 MED ORDER — BACITRACIN ZINC 500 UNIT/GM EX OINT
TOPICAL_OINTMENT | CUTANEOUS | Status: AC
  Filled 2019-06-30: qty 28.35

## 2019-06-30 SURGICAL SUPPLY — 20 items
BAG ATCL THK3 35X25 (MISCELLANEOUS) ×1 IMPLANT
BAG BIOHAZARD 25X35 (MISCELLANEOUS) ×1
BLADE CLIPPER SPEC (BLADE) ×2 IMPLANT
BLADE SURG 11 STRL SS (BLADE) ×2 IMPLANT
BNDG ADH 1X3 SHEER STRL LF (GAUZE/BANDAGES/DRESSINGS) ×10 IMPLANT
BNDG ADH THN 3X1 STRL LF (GAUZE/BANDAGES/DRESSINGS) ×5
COVER BACK TABLE 60X90IN (DRAPES) ×2 IMPLANT
COVER WAND RF STERILE (DRAPES) ×2 IMPLANT
DRAPE HALF SHEET 40X57 (DRAPES) ×2 IMPLANT
DRAPE SHEET LG 3/4 BI-LAMINATE (DRAPES) ×2 IMPLANT
GAUZE SPONGE 4X4 12PLY STRL (GAUZE/BANDAGES/DRESSINGS) ×6 IMPLANT
GLOVE BIO SURGEON STRL SZ8 (GLOVE) ×4 IMPLANT
GLOVE ECLIPSE 8.5 STRL (GLOVE) ×2 IMPLANT
NEEDLE HYPO 18GX1.5 BLUNT FILL (NEEDLE) ×2 IMPLANT
NEEDLE HYPO 25X1 1.5 SAFETY (NEEDLE) ×2 IMPLANT
SOL PREP POV-IOD 4OZ 10% (MISCELLANEOUS) ×2 IMPLANT
STAPLER SKIN PROX WIDE 3.9 (STAPLE) ×2 IMPLANT
SUT ETHILON 3 0 PS 1 (SUTURE) ×10 IMPLANT
SYR CONTROL 10ML LL (SYRINGE) ×2 IMPLANT
TOWEL GREEN STERILE (TOWEL DISPOSABLE) ×2 IMPLANT

## 2019-06-30 NOTE — Brief Op Note (Signed)
06/30/2019  9:02 AM  PATIENT:  [name]  68 y.o. male  PRE-OPERATIVE DIAGNOSIS:  Tremor  POST-OPERATIVE DIAGNOSIS:  Same  PROCEDURE:  Procedure(s) with comments: Fiducial placement (N/A) - Fiducial placement  SURGEON:  Surgeon(s) and Role:    Erline Levine, MD - Primary  PHYSICIAN ASSISTANT:   ASSISTANTS: Poteat, RN   ANESTHESIA:   local  EBL: None  BLOOD ADMINISTERED:none  DRAINS: none   LOCAL MEDICATIONS USED:  LIDOCAINE   SPECIMEN:  No Specimen  DISPOSITION OF SPECIMEN:  N/A  COUNTS:  YES  TOURNIQUET:  * No tourniquets in log *  DICTATION: Indications:  Patient has essential tremor and presents for Star Fix Fiducial Placement for upcoming DBS VIM placement.    Procedure:  Patient was brought to the operating room.  His scalp had been shaved.  Areas of planned fiducial placement were marked, scalp was prepped with betadine.  Scalp was infiltrated with lidocaine with epinephrine.  Four fiducials were placed according to standard landmarks through stab incisions.  3-0 Nylon sutures were placed and sterile dressings were applied.  Patient tolerated procedure well.  He was taken to recovery.  PLAN OF CARE: Discharge to home after PACU  PATIENT DISPOSITION:  PACU - hemodynamically stable.   Delay start of Pharmacological VTE agent (>24hrs) due to surgical blood loss or risk of bleeding: yes

## 2019-06-30 NOTE — Op Note (Signed)
06/30/2019  9:02 AM  PATIENT:  [name]  68 y.o. male  PRE-OPERATIVE DIAGNOSIS:  Tremor  POST-OPERATIVE DIAGNOSIS:  Same  PROCEDURE:  Procedure(s) with comments: Fiducial placement (N/A) - Fiducial placement  SURGEON:  Surgeon(s) and Role:    Erline Levine, MD - Primary  PHYSICIAN ASSISTANT:   ASSISTANTS: Poteat, RN   ANESTHESIA:   local  EBL: None  BLOOD ADMINISTERED:none  DRAINS: none   LOCAL MEDICATIONS USED:  LIDOCAINE   SPECIMEN:  No Specimen  DISPOSITION OF SPECIMEN:  N/A  COUNTS:  YES  TOURNIQUET:  * No tourniquets in log *  DICTATION: Indications:  Patient has essential tremor and presents for Star Fix Fiducial Placement for upcoming DBS VIM placement.    Procedure:  Patient was brought to the operating room.  His scalp had been shaved.  Areas of planned fiducial placement were marked, scalp was prepped with betadine.  Scalp was infiltrated with lidocaine with epinephrine.  Four fiducials were placed according to standard landmarks through stab incisions.  3-0 Nylon sutures were placed and sterile dressings were applied.  Patient tolerated procedure well.  He was taken to recovery.  PLAN OF CARE: Discharge to home after PACU  PATIENT DISPOSITION:  PACU - hemodynamically stable.   Delay start of Pharmacological VTE agent (>24hrs) due to surgical blood loss or risk of bleeding: yes

## 2019-06-30 NOTE — Telephone Encounter (Signed)
Please call patient and make sure he is off of primidone now in anticipation of surgery next week

## 2019-06-30 NOTE — Progress Notes (Signed)
Patient discharged from short stay in a wheelchair with Verdis Prime to [place].

## 2019-06-30 NOTE — Telephone Encounter (Signed)
Called spoke with patient he states he has been off of medication since March

## 2019-07-01 ENCOUNTER — Encounter (HOSPITAL_COMMUNITY): Payer: Self-pay | Admitting: Neurosurgery

## 2019-07-04 ENCOUNTER — Other Ambulatory Visit (HOSPITAL_COMMUNITY)
Admission: RE | Admit: 2019-07-04 | Discharge: 2019-07-04 | Disposition: A | Discharge status: Home / Self Care | Payer: Medicare Other | Source: Ambulatory Visit | Attending: Neurosurgery | Admitting: Neurosurgery

## 2019-07-04 DIAGNOSIS — Z01812 Encounter for preprocedural laboratory examination: Secondary | ICD-10-CM | POA: Insufficient documentation

## 2019-07-04 DIAGNOSIS — Z20828 Contact with and (suspected) exposure to other viral communicable diseases: Secondary | ICD-10-CM | POA: Diagnosis not present

## 2019-07-04 LAB — SARS CORONAVIRUS 2 (TAT 6-24 HRS): SARS Coronavirus 2: NEGATIVE

## 2019-07-05 ENCOUNTER — Other Ambulatory Visit: Payer: Self-pay

## 2019-07-05 ENCOUNTER — Encounter (HOSPITAL_COMMUNITY): Payer: Self-pay | Admitting: *Deleted

## 2019-07-05 NOTE — Progress Notes (Signed)
Pt denies SOB, chest pain, and being under the care of a cardiologist. Pt denies having a cardiac cath and stress test.  Pt denies recent labs but stated that an A1c was drawn " around 5-6 weeks ago " at Dr. Philip Aspen ( PCP) office; nurse requested records.  Pt denies having an EKG and chest x ray within the last year.  Pt made aware to take 40 units of Toujeo the night before surgery.  Pt made aware to take 40 units the morning of surgery if BG is > 70.  Pt stated that fasting blood glucose is usually between 105-150.  Pt made aware to check BG every 2 hours prior to arrival to hospital on DOS. Pt made aware to treat a BG < 70 with 4 glucose tabs wait 15 minutes after intervention to recheck BG, if BG remains < 70, call Short Stay unit to speak with a nurse.  Pt made aware to stop taking Lutein, vitamins, fish oil and herbal medications. Do not take any NSAIDs ie: Ibuprofen, Advil, Naproxen (Aleve), Motrin, BC and Goody Powder. Pt stated that he last dose of Aspirin was 06/27/2019.  Pt denies that he and spouse tested positive for COVID-19; pt tested 07/04/19 and reminded to quarantine.   Coronavirus Screening  Pt denies that he and spouse experienced the following symptoms:  Cough yes/no: No Fever (>100.55F)  yes/no: No Runny nose yes/no: No Sore throat yes/no: No Difficulty breathing/shortness of breath  yes/no: No  Have you or a family member traveled in the last 14 days and where? yes/no: No  Pt reminded that hospital visitation restrictions are in effect and the importance of the restrictions.   Pt verbalized understanding of all pre-op instructions.

## 2019-07-07 ENCOUNTER — Inpatient Hospital Stay (HOSPITAL_COMMUNITY): Payer: Medicare Other | Admitting: Anesthesiology

## 2019-07-07 ENCOUNTER — Other Ambulatory Visit: Payer: Self-pay

## 2019-07-07 ENCOUNTER — Encounter (HOSPITAL_COMMUNITY)
Admission: RE | Disposition: A | Discharge status: Home / Self Care | Payer: Self-pay | Source: Home / Self Care | Attending: Neurosurgery

## 2019-07-07 ENCOUNTER — Encounter (HOSPITAL_COMMUNITY): Payer: Self-pay

## 2019-07-07 ENCOUNTER — Inpatient Hospital Stay (HOSPITAL_COMMUNITY)
Admission: RE | Admit: 2019-07-07 | Discharge: 2019-07-09 | DRG: 026 | Disposition: A | Discharge status: Home / Self Care | Payer: Medicare Other | Attending: Neurosurgery | Admitting: Neurosurgery

## 2019-07-07 ENCOUNTER — Inpatient Hospital Stay (HOSPITAL_COMMUNITY): Payer: Medicare Other

## 2019-07-07 ENCOUNTER — Inpatient Hospital Stay: Admit: 2019-07-07 | Payer: Medicare Other | Admitting: Neurosurgery

## 2019-07-07 DIAGNOSIS — G250 Essential tremor: Secondary | ICD-10-CM | POA: Diagnosis present

## 2019-07-07 DIAGNOSIS — G4733 Obstructive sleep apnea (adult) (pediatric): Secondary | ICD-10-CM | POA: Diagnosis present

## 2019-07-07 DIAGNOSIS — R112 Nausea with vomiting, unspecified: Secondary | ICD-10-CM | POA: Diagnosis not present

## 2019-07-07 DIAGNOSIS — G960 Cerebrospinal fluid leak: Secondary | ICD-10-CM | POA: Diagnosis not present

## 2019-07-07 DIAGNOSIS — Z8673 Personal history of transient ischemic attack (TIA), and cerebral infarction without residual deficits: Secondary | ICD-10-CM | POA: Diagnosis not present

## 2019-07-07 DIAGNOSIS — K219 Gastro-esophageal reflux disease without esophagitis: Secondary | ICD-10-CM | POA: Diagnosis present

## 2019-07-07 DIAGNOSIS — M109 Gout, unspecified: Secondary | ICD-10-CM | POA: Diagnosis present

## 2019-07-07 DIAGNOSIS — Z86711 Personal history of pulmonary embolism: Secondary | ICD-10-CM | POA: Diagnosis not present

## 2019-07-07 DIAGNOSIS — E119 Type 2 diabetes mellitus without complications: Secondary | ICD-10-CM | POA: Diagnosis present

## 2019-07-07 DIAGNOSIS — Z9852 Vasectomy status: Secondary | ICD-10-CM | POA: Diagnosis not present

## 2019-07-07 DIAGNOSIS — I10 Essential (primary) hypertension: Secondary | ICD-10-CM | POA: Diagnosis present

## 2019-07-07 DIAGNOSIS — Z794 Long term (current) use of insulin: Secondary | ICD-10-CM

## 2019-07-07 DIAGNOSIS — Z86718 Personal history of other venous thrombosis and embolism: Secondary | ICD-10-CM

## 2019-07-07 DIAGNOSIS — Z85820 Personal history of malignant melanoma of skin: Secondary | ICD-10-CM

## 2019-07-07 DIAGNOSIS — R251 Tremor, unspecified: Secondary | ICD-10-CM | POA: Diagnosis present

## 2019-07-07 DIAGNOSIS — Z96652 Presence of left artificial knee joint: Secondary | ICD-10-CM | POA: Diagnosis present

## 2019-07-07 DIAGNOSIS — H47012 Ischemic optic neuropathy, left eye: Secondary | ICD-10-CM | POA: Diagnosis present

## 2019-07-07 HISTORY — DX: Presence of spectacles and contact lenses: Z97.3

## 2019-07-07 HISTORY — DX: Presence of external hearing-aid: Z97.4

## 2019-07-07 HISTORY — DX: Depression, unspecified: F32.A

## 2019-07-07 HISTORY — PX: SUBTHALAMIC STIMULATOR INSERTION: SHX5375

## 2019-07-07 LAB — GLUCOSE, CAPILLARY
Glucose-Capillary: 149 mg/dL — ABNORMAL HIGH (ref 70–99)
Glucose-Capillary: 139 mg/dL — ABNORMAL HIGH (ref 70–99)
Glucose-Capillary: 161 mg/dL — ABNORMAL HIGH (ref 70–99)
Glucose-Capillary: 145 mg/dL — ABNORMAL HIGH (ref 70–99)

## 2019-07-07 LAB — ABO/RH: ABO/RH(D): A POS

## 2019-07-07 LAB — BASIC METABOLIC PANEL
Anion gap: 12 (ref 5–15)
BUN: 24 mg/dL — ABNORMAL HIGH (ref 8–23)
CO2: 23 mmol/L (ref 22–32)
Calcium: 9.1 mg/dL (ref 8.9–10.3)
Chloride: 104 mmol/L (ref 98–111)
Creatinine, Ser: 1.78 mg/dL — ABNORMAL HIGH (ref 0.61–1.24)
GFR calc Af Amer: 44 mL/min — ABNORMAL LOW (ref >60)
GFR calc non Af Amer: 38 mL/min — ABNORMAL LOW (ref >60)
Glucose, Bld: 145 mg/dL — ABNORMAL HIGH (ref 70–99)
Potassium: 3.9 mmol/L (ref 3.5–5.1)
Sodium: 139 mmol/L (ref 135–145)

## 2019-07-07 LAB — CBC
HCT: 41.3 % (ref 39.0–52.0)
Hemoglobin: 13.5 g/dL (ref 13.0–17.0)
MCH: 29.7 pg (ref 26.0–34.0)
MCHC: 32.7 g/dL (ref 30.0–36.0)
MCV: 91 fL (ref 80.0–100.0)
Platelets: 299 10*3/uL (ref 150–400)
RBC: 4.54 MIL/uL (ref 4.22–5.81)
RDW: 12.8 % (ref 11.5–15.5)
WBC: 11.6 10*3/uL — ABNORMAL HIGH (ref 4.0–10.5)
nRBC: 0 % (ref 0.0–0.2)

## 2019-07-07 LAB — TYPE AND SCREEN
ABO/RH(D): A POS
Antibody Screen: NEGATIVE

## 2019-07-07 SURGERY — SUBTHALAMIC STIMULATOR INSERTION
Anesthesia: Monitor Anesthesia Care | Laterality: Bilateral

## 2019-07-07 SURGERY — SUBTHALAMIC STIMULATOR INSERTION
Anesthesia: Monitor Anesthesia Care | Site: Head | Laterality: Left

## 2019-07-07 MED ORDER — LIDOCAINE-EPINEPHRINE 1 %-1:100000 IJ SOLN
INTRAMUSCULAR | Status: AC
  Filled 2019-07-07: qty 3

## 2019-07-07 MED ORDER — LOSARTAN POTASSIUM 50 MG PO TABS
100.0000 mg | ORAL_TABLET | Freq: Every day | ORAL | Status: DC
  Administered 2019-07-07 – 2019-07-08 (×2): 100 mg via ORAL
  Filled 2019-07-07 (×3): qty 2

## 2019-07-07 MED ORDER — AMLODIPINE BESYLATE 5 MG PO TABS
5.0000 mg | ORAL_TABLET | Freq: Every evening | ORAL | Status: DC
  Administered 2019-07-07 – 2019-07-08 (×2): 5 mg via ORAL
  Filled 2019-07-07 (×2): qty 1

## 2019-07-07 MED ORDER — SODIUM CHLORIDE 0.9% FLUSH: 3.0000 mL | INTRAVENOUS | Status: DC | PRN

## 2019-07-07 MED ORDER — INSULIN GLARGINE 100 UNIT/ML ~~LOC~~ SOLN
80.0000 [IU] | Freq: Two times a day (BID) | SUBCUTANEOUS | Status: DC
  Administered 2019-07-07 – 2019-07-08 (×3): 80 [IU] via SUBCUTANEOUS
  Filled 2019-07-07 (×5): qty 0.8

## 2019-07-07 MED ORDER — LUTEIN-ZEAXANTHIN 25-5 MG PO CAPS: 1.0000 | ORAL_CAPSULE | Freq: Every day | ORAL | Status: DC

## 2019-07-07 MED ORDER — ONDANSETRON HCL 4 MG/2ML IJ SOLN
4.0000 mg | Freq: Four times a day (QID) | INTRAMUSCULAR | Status: DC | PRN
  Administered 2019-07-08: 4 mg via INTRAVENOUS
  Filled 2019-07-07: qty 2

## 2019-07-07 MED ORDER — 0.9 % SODIUM CHLORIDE (POUR BTL) OPTIME
TOPICAL | Status: DC | PRN
  Administered 2019-07-07: 1000 mL

## 2019-07-07 MED ORDER — DEXMEDETOMIDINE HCL IN NACL 400 MCG/100ML IV SOLN
INTRAVENOUS | Status: DC | PRN
  Administered 2019-07-07: .1 ug/kg/h via INTRAVENOUS

## 2019-07-07 MED ORDER — ONDANSETRON HCL 4 MG PO TABS: 4.0000 mg | ORAL_TABLET | Freq: Four times a day (QID) | ORAL | Status: DC | PRN

## 2019-07-07 MED ORDER — ONDANSETRON HCL 4 MG/2ML IJ SOLN
INTRAMUSCULAR | Status: DC | PRN
  Administered 2019-07-07: 4 mg via INTRAVENOUS

## 2019-07-07 MED ORDER — DIPHENHYDRAMINE HCL 50 MG/ML IJ SOLN
INTRAMUSCULAR | Status: DC | PRN
  Administered 2019-07-07: 12.5 mg via INTRAVENOUS

## 2019-07-07 MED ORDER — CHLORHEXIDINE GLUCONATE CLOTH 2 % EX PADS: 6.0000 | MEDICATED_PAD | Freq: Once | CUTANEOUS | Status: DC

## 2019-07-07 MED ORDER — ALUM & MAG HYDROXIDE-SIMETH 200-200-20 MG/5ML PO SUSP: 30.0000 mL | Freq: Four times a day (QID) | ORAL | Status: DC | PRN

## 2019-07-07 MED ORDER — PROPOFOL 10 MG/ML IV BOLUS
INTRAVENOUS | Status: AC
  Filled 2019-07-07: qty 20

## 2019-07-07 MED ORDER — POLYETHYL GLYCOL-PROPYL GLYCOL 0.4-0.3 % OP GEL: Freq: Four times a day (QID) | OPHTHALMIC | Status: DC | PRN

## 2019-07-07 MED ORDER — BUPIVACAINE HCL 0.5 % IJ SOLN
INTRAMUSCULAR | Status: DC | PRN
  Administered 2019-07-07: 15 mL

## 2019-07-07 MED ORDER — ACETAMINOPHEN 325 MG PO TABS
650.0000 mg | ORAL_TABLET | ORAL | Status: DC | PRN
  Administered 2019-07-08: 650 mg via ORAL
  Filled 2019-07-07: qty 2

## 2019-07-07 MED ORDER — INSULIN ASPART 100 UNIT/ML ~~LOC~~ SOLN
0.0000 [IU] | Freq: Three times a day (TID) | SUBCUTANEOUS | Status: DC
  Administered 2019-07-07 – 2019-07-08 (×2): 3 [IU] via SUBCUTANEOUS
  Administered 2019-07-08 (×2): 5 [IU] via SUBCUTANEOUS
  Administered 2019-07-09: 08:00:00 3 [IU] via SUBCUTANEOUS

## 2019-07-07 MED ORDER — POLYETHYLENE GLYCOL 3350 17 G PO PACK
17.0000 g | PACK | Freq: Every day | ORAL | Status: DC | PRN
  Administered 2019-07-07: 21:00:00 17 g via ORAL
  Filled 2019-07-07: qty 1

## 2019-07-07 MED ORDER — POLYVINYL ALCOHOL 1.4 % OP SOLN: 1.0000 [drp] | Freq: Four times a day (QID) | OPHTHALMIC | Status: DC | PRN

## 2019-07-07 MED ORDER — DULOXETINE HCL 30 MG PO CPEP
60.0000 mg | ORAL_CAPSULE | Freq: Every day | ORAL | Status: DC
  Administered 2019-07-07 – 2019-07-08 (×2): 60 mg via ORAL
  Filled 2019-07-07 (×2): qty 2

## 2019-07-07 MED ORDER — MUPIROCIN 2 % EX OINT
1.0000 "application " | TOPICAL_OINTMENT | Freq: Two times a day (BID) | CUTANEOUS | Status: DC
  Administered 2019-07-08: 1 via TOPICAL
  Filled 2019-07-07: qty 22

## 2019-07-07 MED ORDER — MENTHOL 3 MG MT LOZG: 1.0000 | LOZENGE | OROMUCOSAL | Status: DC | PRN

## 2019-07-07 MED ORDER — PHENOL 1.4 % MT LIQD: 1.0000 | OROMUCOSAL | Status: DC | PRN

## 2019-07-07 MED ORDER — METFORMIN HCL 500 MG PO TABS
1000.0000 mg | ORAL_TABLET | Freq: Two times a day (BID) | ORAL | Status: DC
  Administered 2019-07-07 – 2019-07-09 (×4): 1000 mg via ORAL
  Filled 2019-07-07 (×4): qty 2

## 2019-07-07 MED ORDER — DOCUSATE SODIUM 100 MG PO CAPS
100.0000 mg | ORAL_CAPSULE | Freq: Two times a day (BID) | ORAL | Status: DC
  Administered 2019-07-07 – 2019-07-08 (×3): 100 mg via ORAL
  Filled 2019-07-07 (×3): qty 1

## 2019-07-07 MED ORDER — DEXTROSE 5 % IV SOLN
3.0000 g | Freq: Once | INTRAVENOUS | Status: AC
  Administered 2019-07-07: 3 g via INTRAVENOUS
  Filled 2019-07-07: qty 3000

## 2019-07-07 MED ORDER — FENTANYL CITRATE (PF) 250 MCG/5ML IJ SOLN
INTRAMUSCULAR | Status: AC
  Filled 2019-07-07: qty 5

## 2019-07-07 MED ORDER — PANTOPRAZOLE SODIUM 40 MG IV SOLR
40.0000 mg | Freq: Every day | INTRAVENOUS | Status: DC
  Administered 2019-07-07: 21:00:00 40 mg via INTRAVENOUS
  Filled 2019-07-07: qty 40

## 2019-07-07 MED ORDER — HYDROCODONE-ACETAMINOPHEN 5-325 MG PO TABS
1.0000 | ORAL_TABLET | ORAL | Status: DC | PRN
  Administered 2019-07-07 – 2019-07-08 (×3): 2 via ORAL
  Filled 2019-07-07 (×3): qty 2

## 2019-07-07 MED ORDER — FENOFIBRATE 54 MG PO TABS
54.0000 mg | ORAL_TABLET | Freq: Every day | ORAL | Status: DC
  Administered 2019-07-08: 12:00:00 54 mg via ORAL
  Filled 2019-07-07 (×2): qty 1

## 2019-07-07 MED ORDER — FLEET ENEMA 7-19 GM/118ML RE ENEM: 1.0000 | ENEMA | Freq: Once | RECTAL | Status: DC | PRN

## 2019-07-07 MED ORDER — PROPOFOL 10 MG/ML IV BOLUS
INTRAVENOUS | Status: DC | PRN
  Administered 2019-07-07 (×2): 20 mg via INTRAVENOUS

## 2019-07-07 MED ORDER — MORPHINE SULFATE (PF) 2 MG/ML IV SOLN
2.0000 mg | INTRAVENOUS | Status: DC | PRN
  Administered 2019-07-07: 13:00:00 2 mg via INTRAVENOUS
  Filled 2019-07-07: qty 1

## 2019-07-07 MED ORDER — HYDROCHLOROTHIAZIDE 25 MG PO TABS
25.0000 mg | ORAL_TABLET | Freq: Every day | ORAL | Status: DC
  Administered 2019-07-07 – 2019-07-08 (×2): 25 mg via ORAL
  Filled 2019-07-07 (×2): qty 1

## 2019-07-07 MED ORDER — LACTATED RINGERS IV SOLN
INTRAVENOUS | Status: DC
  Administered 2019-07-07: 08:00:00 via INTRAVENOUS

## 2019-07-07 MED ORDER — ADULT MULTIVITAMIN W/MINERALS CH: 1.0000 | ORAL_TABLET | Freq: Every day | ORAL | Status: DC

## 2019-07-07 MED ORDER — HYDROCODONE-ACETAMINOPHEN 5-325 MG PO TABS
1.0000 | ORAL_TABLET | ORAL | Status: DC | PRN
  Administered 2019-07-07: 14:00:00 1 via ORAL
  Filled 2019-07-07: qty 1

## 2019-07-07 MED ORDER — THROMBIN 5000 UNITS EX SOLR
OROMUCOSAL | Status: DC | PRN
  Administered 2019-07-07: 5 mL via TOPICAL

## 2019-07-07 MED ORDER — ATORVASTATIN CALCIUM 40 MG PO TABS
40.0000 mg | ORAL_TABLET | Freq: Every evening | ORAL | Status: DC
  Administered 2019-07-07 – 2019-07-08 (×2): 40 mg via ORAL
  Filled 2019-07-07 (×2): qty 1

## 2019-07-07 MED ORDER — LIDOCAINE-EPINEPHRINE 1 %-1:100000 IJ SOLN
INTRAMUSCULAR | Status: DC | PRN
  Administered 2019-07-07: 15 mL

## 2019-07-07 MED ORDER — MIDAZOLAM HCL 5 MG/5ML IJ SOLN
INTRAMUSCULAR | Status: DC | PRN
  Administered 2019-07-07: 1 mg via INTRAVENOUS

## 2019-07-07 MED ORDER — SODIUM BICARBONATE 4 % IV SOLN
INTRAVENOUS | Status: DC | PRN
  Administered 2019-07-07: 5 mL via INTRAVENOUS

## 2019-07-07 MED ORDER — OXYCODONE HCL 5 MG PO TABS
5.0000 mg | ORAL_TABLET | ORAL | Status: DC | PRN
  Administered 2019-07-07: 16:00:00 5 mg via ORAL
  Filled 2019-07-07: qty 1

## 2019-07-07 MED ORDER — POTASSIUM CHLORIDE IN NACL 20-0.9 MEQ/L-% IV SOLN: INTRAVENOUS | Status: DC

## 2019-07-07 MED ORDER — THROMBIN 5000 UNITS EX SOLR
CUTANEOUS | Status: AC
  Filled 2019-07-07: qty 5000

## 2019-07-07 MED ORDER — BISACODYL 10 MG RE SUPP: 10.0000 mg | Freq: Every day | RECTAL | Status: DC | PRN

## 2019-07-07 MED ORDER — SODIUM CHLORIDE 0.9 % IV SOLN: 250.0000 mL | INTRAVENOUS | Status: DC

## 2019-07-07 MED ORDER — BACITRACIN ZINC 500 UNIT/GM EX OINT
TOPICAL_OINTMENT | CUTANEOUS | Status: AC
  Filled 2019-07-07: qty 28.35

## 2019-07-07 MED ORDER — ACETAMINOPHEN 650 MG RE SUPP: 650.0000 mg | RECTAL | Status: DC | PRN

## 2019-07-07 MED ORDER — INSULIN ASPART 100 UNIT/ML ~~LOC~~ SOLN: 0.0000 [IU] | Freq: Every day | SUBCUTANEOUS | Status: DC

## 2019-07-07 MED ORDER — MIDAZOLAM HCL 2 MG/2ML IJ SOLN
INTRAMUSCULAR | Status: AC
  Filled 2019-07-07: qty 2

## 2019-07-07 MED ORDER — CEFAZOLIN SODIUM-DEXTROSE 2-4 GM/100ML-% IV SOLN
2.0000 g | Freq: Three times a day (TID) | INTRAVENOUS | Status: AC
  Administered 2019-07-07 (×2): 2 g via INTRAVENOUS
  Filled 2019-07-07 (×2): qty 100

## 2019-07-07 MED ORDER — INSULIN GLARGINE (1 UNIT DIAL) 300 UNIT/ML ~~LOC~~ SOPN: 80.0000 [IU] | PEN_INJECTOR | Freq: Two times a day (BID) | SUBCUTANEOUS | Status: DC

## 2019-07-07 MED ORDER — SODIUM CHLORIDE 0.9% FLUSH
3.0000 mL | Freq: Two times a day (BID) | INTRAVENOUS | Status: DC
  Administered 2019-07-07 – 2019-07-08 (×3): 3 mL via INTRAVENOUS

## 2019-07-07 MED ORDER — PHENYLEPHRINE 40 MCG/ML (10ML) SYRINGE FOR IV PUSH (FOR BLOOD PRESSURE SUPPORT)
PREFILLED_SYRINGE | INTRAVENOUS | Status: AC
  Filled 2019-07-07: qty 10

## 2019-07-07 MED ORDER — BUPIVACAINE HCL (PF) 0.5 % IJ SOLN
INTRAMUSCULAR | Status: AC
  Filled 2019-07-07: qty 30

## 2019-07-07 SURGICAL SUPPLY — 70 items
BIT DRILL NEURO 2X3.1 SFT TUCH (MISCELLANEOUS) IMPLANT
BLADE CLIPPER SURG (BLADE) ×2 IMPLANT
BLADE SURG 11 STRL SS (BLADE) ×4 IMPLANT
BNDG ADH 1X3 SHEER STRL LF (GAUZE/BANDAGES/DRESSINGS) IMPLANT
BNDG ADH THN 3X1 STRL LF (GAUZE/BANDAGES/DRESSINGS)
BNDG GAUZE ELAST 4 BULKY (GAUZE/BANDAGES/DRESSINGS) IMPLANT
BOOT SUTURE AID YELLOW STND (SUTURE) IMPLANT
CABLE EXTENSION OR (MISCELLANEOUS) ×2 IMPLANT
CABLE MER (MISCELLANEOUS) ×2 IMPLANT
CANISTER SUCT 3000ML PPV (MISCELLANEOUS) ×4 IMPLANT
CARTRIDGE OIL MAESTRO DRILL (MISCELLANEOUS) ×1 IMPLANT
CLIP RANEY DISP (INSTRUMENTS) ×6 IMPLANT
DIFFUSER DRILL AIR PNEUMATIC (MISCELLANEOUS) ×2 IMPLANT
DRAPE POUCH INSTRU U-SHP 10X18 (DRAPES) ×2 IMPLANT
DRAPE STERI IOBAN 125X83 (DRAPES) ×2 IMPLANT
DRILL NEURO 2X3.1 SOFT TOUCH (MISCELLANEOUS)
DRSG OPSITE 4X5.5 SM (GAUZE/BANDAGES/DRESSINGS) ×4 IMPLANT
DRSG TEGADERM 2-3/8X2-3/4 SM (GAUZE/BANDAGES/DRESSINGS) ×4 IMPLANT
DRSG TELFA 3X8 NADH (GAUZE/BANDAGES/DRESSINGS) ×2 IMPLANT
DURAPREP 26ML APPLICATOR (WOUND CARE) ×2 IMPLANT
GAUZE 4X4 16PLY RFD (DISPOSABLE) IMPLANT
GAUZE SPONGE 4X4 12PLY STRL (GAUZE/BANDAGES/DRESSINGS) IMPLANT
GLOVE BIO SURGEON STRL SZ 6.5 (GLOVE) ×4 IMPLANT
GLOVE BIO SURGEON STRL SZ7 (GLOVE) ×4 IMPLANT
GLOVE BIO SURGEON STRL SZ8 (GLOVE) ×4 IMPLANT
GLOVE BIOGEL PI IND STRL 7.0 (GLOVE) ×2 IMPLANT
GLOVE BIOGEL PI IND STRL 7.5 (GLOVE) ×2 IMPLANT
GLOVE BIOGEL PI IND STRL 8 (GLOVE) ×2 IMPLANT
GLOVE BIOGEL PI IND STRL 8.5 (GLOVE) ×2 IMPLANT
GLOVE BIOGEL PI INDICATOR 7.0 (GLOVE) ×2
GLOVE BIOGEL PI INDICATOR 7.5 (GLOVE) ×2
GLOVE BIOGEL PI INDICATOR 8 (GLOVE) ×2
GLOVE BIOGEL PI INDICATOR 8.5 (GLOVE) ×2
GLOVE ECLIPSE 8.0 STRL XLNG CF (GLOVE) ×4 IMPLANT
GLOVE EXAM NITRILE XL STR (GLOVE) IMPLANT
GOWN STRL REUS W/ TWL LRG LVL3 (GOWN DISPOSABLE) ×1 IMPLANT
GOWN STRL REUS W/ TWL XL LVL3 (GOWN DISPOSABLE) ×1 IMPLANT
GOWN STRL REUS W/TWL 2XL LVL3 (GOWN DISPOSABLE) ×2 IMPLANT
GOWN STRL REUS W/TWL LRG LVL3 (GOWN DISPOSABLE) ×2
GOWN STRL REUS W/TWL XL LVL3 (GOWN DISPOSABLE) ×2
HEMOSTAT POWDER KIT SURGIFOAM (HEMOSTASIS) ×2 IMPLANT
KIT BASIN OR (CUSTOM PROCEDURE TRAY) ×2 IMPLANT
KIT COVER BURR HOLE (Miscellaneous) ×2 IMPLANT
KIT HEADREST PASSIVE (NEUROSURGERY SUPPLIES) ×2 IMPLANT
KIT NEUROSTIM DBS 45 8 CONT (Miscellaneous) ×1 IMPLANT
KIT NEUROSTIM DBS W/BURR COVER (Miscellaneous) ×2 IMPLANT
KIT SUTURE REMOVAL HAMOT (SET/KITS/TRAYS/PACK) ×2 IMPLANT
KIT TURNOVER KIT B (KITS) ×2 IMPLANT
LEAD KIT DBS DIRECTIONAL 45C (Miscellaneous) ×1 IMPLANT
MARKER SKIN DUAL TIP RULER LAB (MISCELLANEOUS) ×4 IMPLANT
NEEDLE HYPO 25X1 1.5 SAFETY (NEEDLE) ×2 IMPLANT
NEEDLE SPNL 18GX3.5 QUINCKE PK (NEEDLE) IMPLANT
NEEDLE SPNL 22GX3.5 QUINCKE BK (NEEDLE) IMPLANT
NS IRRIG 1000ML POUR BTL (IV SOLUTION) ×2 IMPLANT
OIL CARTRIDGE MAESTRO DRILL (MISCELLANEOUS) ×2
PACK LAMINECTOMY NEURO (CUSTOM PROCEDURE TRAY) ×2 IMPLANT
PERFORATOR LRG  14-11MM (BIT) ×1
PERFORATOR LRG 14-11MM (BIT) ×1 IMPLANT
PLATEFORM ARRAY DZAP LEADPOINT (MISCELLANEOUS) ×1
PLATFORM ARRAY DZAP LEADPOINT (MISCELLANEOUS) ×1 IMPLANT
PLATFORM BILATERAL KIT (MISCELLANEOUS) ×2 IMPLANT
SET SINGLE IT STRL (KITS) ×2 IMPLANT
SPONGE SURGIFOAM ABS GEL SZ50 (HEMOSTASIS) IMPLANT
STAPLER SKIN PROX WIDE 3.9 (STAPLE) ×2 IMPLANT
SUT ETHILON 3 0 PS 1 (SUTURE) IMPLANT
SUT SILK 2 0 TIES 10X30 (SUTURE) ×2 IMPLANT
SUT VIC AB 2-0 CP2 18 (SUTURE) ×2 IMPLANT
TOWEL GREEN STERILE (TOWEL DISPOSABLE) ×2 IMPLANT
TOWEL GREEN STERILE FF (TOWEL DISPOSABLE) ×2 IMPLANT
WATER STERILE IRR 1000ML POUR (IV SOLUTION) ×2 IMPLANT

## 2019-07-07 NOTE — Progress Notes (Signed)
out of unit to ct

## 2019-07-07 NOTE — Brief Op Note (Signed)
07/07/2019  11:04 AM  PATIENT:  Joshua [name]  68 y.o. male  PRE-OPERATIVE DIAGNOSIS:  Tremor  POST-OPERATIVE DIAGNOSIS:  tremor  PROCEDURE:  Procedure(s): DEEP BRAIN STIMULATOR (Left)  SURGEON:  Surgeon(s) and Role:    Erline Levine, MD - Primary  PHYSICIAN ASSISTANT:   ASSISTANTS: Poteat, RN   ANESTHESIA:   local and IV sedation  EBL:  Minimal  BLOOD ADMINISTERED:none  DRAINS: none   LOCAL MEDICATIONS USED:  LIDOCAINE   SPECIMEN:  No Specimen  DISPOSITION OF SPECIMEN:  N/A  COUNTS:  YES  TOURNIQUET:  * No tourniquets in log *  DICTATION: DICTATION:   Indications: Patient is a 68 year old man with Essential Tremor [name]  Procedure: Preoperative planing was performed with volumetricCT and placement of 4 fiducial markers in the skull followed by CT of the brain also obtained volumetrically. These were then exported to create Starfix head frame with planned targeting of VIM nucleus electrodes was performed. The patient was brought to the operating room and placed in a semi-Fowler's position with his neck and stabilized in a neck holder. This was affixed to the Mayfield adapter. His scalp was then prepped with DuraPrep and subsequently draped with an Ioban drape.with the fiducials and the skin was infiltrated with local lidocaine.  The areas of planned incision were then infiltrated with local lidocaine with epinephrine. The stepoffs were connected and the Starfix frame was assembled.  The entry points were then marked.  2 curvilinear incisions were made centered on the bilateral entry points. An elevator was used to clear pericranium from the skull. The perforator it was then used to produce two 14 mm bur holes. The dura was coagulated with bipolar electrocautery. Initially we operated on the left. After opening the dura and a localizing the entry point the stylette and  outer cannula were inserted into the brain. Surgifoam was placed to prevent CSF leakage at each entry site. Microelectrode recordings were then performed and  we had very good recordings from the VIM. Subsequently the stimulating electrode was placed and the patient had significant improvement in tremor on the right side of the body without significant side effects to higher voltages. The electrode was then locked into position with the stimlock cap. The redundant electrode was circularized and tunneled under the scalp. At this point, the patient became nauseated and had a vagal reaction with dry heaves.  He had leakage of CSF.  We felt that at this point, accuracy for the next trajectory would be compromised, so we elected to not place the right sided electrode. The electrode was tunneled to the right and then into the posterior scalp and locked into position. The wounds were then irrigated and closed with 2-0 Vicryl sutures and staples. The fiducials were removed and staples were placed over each of these sites. The head was washed and then sterile occlusive dressings were placed. The patient was taken to recovery having tolerated her procedure without difficulty or untoward effect. Please refer to detailed microelectrode recordings from Dr. Carles Collet for more specifics of the positioning of the electrodes. These are included in her Epic note from the detailed neural monitoring and physical exam assessment during the surgery.  PLAN OF CARE: Admit to inpatient   PATIENT DISPOSITION:  PACU - hemodynamically stable.   Delay start of Pharmacological VTE agent (>24hrs) due to surgical blood loss or risk of bleeding: yes

## 2019-07-07 NOTE — Transfer of Care (Signed)
Immediate Anesthesia Transfer of Care Note  Patient: Joshua [name]  Procedure(s) Performed: DEEP BRAIN STIMULATOR (Left Head)  Patient Location: PACU  Anesthesia Type:MAC  Level of Consciousness: awake, alert  and oriented  Airway & Oxygen Therapy: Patient connected to face mask oxygen  Post-op Assessment: Post -op Vital signs reviewed and stable  Post vital signs: stable  Last Vitals:  Vitals Value Taken Time  BP 125/95 07/07/19 1033  Temp    Pulse 76 07/07/19 1033  Resp 12 07/07/19 1033  SpO2 98 % 07/07/19 1033  Vitals shown include unvalidated device data.  Last Pain: There were no vitals filed for this visit.       Complications: No apparent anesthesia complications

## 2019-07-07 NOTE — Op Note (Signed)
07/07/2019  11:04 AM  PATIENT:  Joshua [name]  68 y.o. male  PRE-OPERATIVE DIAGNOSIS:  Tremor  POST-OPERATIVE DIAGNOSIS:  tremor  PROCEDURE:  Procedure(s): DEEP BRAIN STIMULATOR (Left)  SURGEON:  Surgeon(s) and Role:    Erline Levine, MD - Primary  PHYSICIAN ASSISTANT:   ASSISTANTS: Poteat, RN   ANESTHESIA:   local and IV sedation  EBL:  Minimal  BLOOD ADMINISTERED:none  DRAINS: none   LOCAL MEDICATIONS USED:  LIDOCAINE   SPECIMEN:  No Specimen  DISPOSITION OF SPECIMEN:  N/A  COUNTS:  YES  TOURNIQUET:  * No tourniquets in log *  DICTATION: DICTATION:   Indications: Patient is a 68 year old man with Essential Tremor [name]  Procedure: Preoperative planing was performed with volumetricCT and placement of 4 fiducial markers in the skull followed by CT of the brain also obtained volumetrically. These were then exported to create Starfix head frame with planned targeting of VIM nucleus electrodes was performed. The patient was brought to the operating room and placed in a semi-Fowler's position with his neck and stabilized in a neck holder. This was affixed to the Mayfield adapter. His scalp was then prepped with DuraPrep and subsequently draped with an Ioban drape.with the fiducials and the skin was infiltrated with local lidocaine.  The areas of planned incision were then infiltrated with local lidocaine with epinephrine. The stepoffs were connected and the Starfix frame was assembled.  The entry points were then marked.  2 curvilinear incisions were made centered on the bilateral entry points. An elevator was used to clear pericranium from the skull. The perforator it was then used to produce two 14 mm bur holes. The dura was coagulated with bipolar electrocautery. Initially we operated on the left. After opening the dura and a localizing the entry point the stylette and  outer cannula were inserted into the brain. Surgifoam was placed to prevent CSF leakage at each entry site. Microelectrode recordings were then performed and  we had very good recordings from the VIM. Subsequently the stimulating electrode was placed and the patient had significant improvement in tremor on the right side of the body without significant side effects to higher voltages. The electrode was then locked into position with the stimlock cap. The redundant electrode was circularized and tunneled under the scalp. At this point, the patient became nauseated and had a vagal reaction with dry heaves.  He had leakage of CSF.  We felt that at this point, accuracy for the next trajectory would be compromised, so we elected to not place the right sided electrode. The electrode was tunneled to the right and then into the posterior scalp and locked into position. The wounds were then irrigated and closed with 2-0 Vicryl sutures and staples. The fiducials were removed and staples were placed over each of these sites. The head was washed and then sterile occlusive dressings were placed. The patient was taken to recovery having tolerated her procedure without difficulty or untoward effect. Please refer to detailed microelectrode recordings from Dr. Carles Collet for more specifics of the positioning of the electrodes. These are included in her Epic note from the detailed neural monitoring and physical exam assessment during the surgery.  PLAN OF CARE: Admit to inpatient   PATIENT DISPOSITION:  PACU - hemodynamically stable.   Delay start of Pharmacological VTE agent (>24hrs) due to surgical blood loss or risk of bleeding: yes

## 2019-07-07 NOTE — Anesthesia Postprocedure Evaluation (Signed)
Anesthesia Post Note  Patient: Joshua [name]  Procedure(s) Performed: LEFT DEEP BRAIN STIMULATOR PLACEMENT (Left Head)     Patient location during evaluation: PACU Anesthesia Type: MAC Level of consciousness: awake and alert Pain management: pain level controlled Vital Signs Assessment: post-procedure vital signs reviewed and stable Respiratory status: spontaneous breathing, nonlabored ventilation, respiratory function stable and patient connected to nasal cannula oxygen Cardiovascular status: stable and blood pressure returned to baseline Postop Assessment: no apparent nausea or vomiting Anesthetic complications: no    Last Vitals:  Vitals:   07/07/19 1145 07/07/19 1219  BP:  (!) 148/86  Pulse: 77 74  Resp: 14 16  Temp:  36.8 C  SpO2: 96% 100%    Last Pain:  Vitals:   07/07/19 1219  TempSrc: Oral  PainSc:                  Joshua [name]

## 2019-07-07 NOTE — Interval H&P Note (Signed)
History and Physical Interval Note:  07/07/2019 7:38 AM  Joshua [name]  has presented today for surgery, with the diagnosis of Tremor.  The various methods of treatment have been discussed with the patient and family. After consideration of risks, benefits and other options for treatment, the patient has consented to  Procedure(s): BILATERAL DEEP BRAIN STIMULATOR (Bilateral) as a surgical intervention.  The patient's history has been reviewed, patient examined, no change in status, stable for surgery.  I have reviewed the patient's chart and labs.  Questions were answered to the patient's satisfaction.     [name]

## 2019-07-07 NOTE — Anesthesia Preprocedure Evaluation (Signed)
Anesthesia Evaluation  Patient identified by MRN, date of birth, ID band Patient awake    Reviewed: Allergy & Precautions, NPO status , Patient's Chart, lab work & pertinent test results  Airway Mallampati: II  TM Distance: >3 FB Neck ROM: Full    Dental   Pulmonary sleep apnea , former smoker,    Pulmonary exam normal        Cardiovascular hypertension, Pt. on medications Normal cardiovascular exam     Neuro/Psych Depression CVA    GI/Hepatic GERD  Medicated and Controlled,  Endo/Other  diabetes, Type 2, Insulin Dependent  Renal/GU      Musculoskeletal   Abdominal   Peds  Hematology   Anesthesia Other Findings   Reproductive/Obstetrics                             Anesthesia Physical Anesthesia Plan  ASA: III  Anesthesia Plan: MAC   Post-op Pain Management:    Induction: Intravenous  PONV Risk Score and Plan: 1  Airway Management Planned: Simple Face Mask  Additional Equipment:   Intra-op Plan:   Post-operative Plan:   Informed Consent: I have reviewed the patients History and Physical, chart, labs and discussed the procedure including the risks, benefits and alternatives for the proposed anesthesia with the patient or authorized representative who has indicated his/her understanding and acceptance.       Plan Discussed with: CRNA and Surgeon  Anesthesia Plan Comments:         Anesthesia Quick Evaluation

## 2019-07-07 NOTE — Progress Notes (Signed)
Patient ID: Joshua [name], male   DOB: 1951-06-21, 68 y.o.   MRN: [id number] Walking from bathroom to bed. In good spirits, only reporting persistent significant headache. No nausea, dizziness or other deficit. Will monitor, expecting headache improvement over the next couple hours with rest.

## 2019-07-07 NOTE — Progress Notes (Signed)
From cy / unchanged loc and fxn

## 2019-07-07 NOTE — Op Note (Deleted)
Preoperative diagnosis:  Essential Tremor Postoperative diagnosis:  same Surgeon:  Erline Levine Neurologist:  Wells Guiles Maximiliano Cromartie  Indication for procedure: Determination of optimal electrode position for deep brain stimulation therapy.  Description of procedure:  Following the incision and burr hole placement, a recording microelectrode was slowly advanced into the brain via a motorized drive.  Beginning approximately 10 mm above the target, recordings were periodically made, and the resultant brain activity was described on the neurophysiologic recording worksheet.    A total of one recording pass was obtained on the left side of the brain.    8 mm of VIM were recorded from the left side of the brain with tremor cells and kinetic response noted.  Following the recording, the recording electrode was replaced by a stimulating electrode by Dr. Vertell Limber.  Test stimulations were then obtained.  Active contacts that were used and the response to stimulation, including both adverse effects and therapeutic effects, were recorded on the neurophysiologic stimulation worksheet.  In brief, there was complete resolution of  tremor following activation and stimulation.  Adverse effects only occurred at supratherapeutic voltages and consisted of lip/tongue paresthesias.  Following completion of this side and before recording the opposite side of the brain, the patient became nauseated with dry heaves with a vagal response.  He had loss of CSF.  It was felt that accuracy for target position would have been compromised due to this and the decision was made to not place an electrode on the right side of the brain.  Alonza Bogus, DO [facility] Director of Movement Disorders

## 2019-07-07 NOTE — Evaluation (Signed)
Physical Therapy Evaluation Patient Details Name: Joshua [name] MRN: [id number] DOB: 1951-03-01 Today's Date: 07/07/2019   History of Present Illness  Pt is a 68 y/o male s/p L subthalamic stimulator insertion. PMH includes OSA on CPAP, HTN, CVA, gout, and L TKA.   Clinical Impression  Pt is s/p surgery above with deficits below. Pt with unsteadiness during gait requiring min to min guard A for stability. Pt reports wife will be able to assist as needed at d/c and has DME if needed. Will continue to follow acutely to maximize functional mobility independence and safety.     Follow Up Recommendations No PT follow up    Equipment Recommendations  None recommended by PT    Recommendations for Other Services       Precautions / Restrictions Precautions Precautions: Fall Restrictions Weight Bearing Restrictions: No      Mobility  Bed Mobility Overal bed mobility: Needs Assistance Bed Mobility: Supine to Sit;Sit to Supine     Supine to sit: Supervision Sit to supine: Supervision   General bed mobility comments: Supervision for safety.   Transfers Overall transfer level: Needs assistance Equipment used: None Transfers: Sit to/from Stand Sit to Stand: Min assist         General transfer comment: Min A for steadying assist to stand. Pt reporting mild dizziness initially upon standing, that resolved.   Ambulation/Gait Ambulation/Gait assistance: Min assist;Min guard Gait Distance (Feet): 200 Feet Assistive device: None Gait Pattern/deviations: Step-through pattern;Decreased stride length Gait velocity: Decreased   General Gait Details: Slow, mildly unsteady gait. Requiring min to min guard for steadying assist throughout.   Stairs            Wheelchair Mobility    Modified Rankin (Stroke Patients Only)       Balance Overall balance assessment: Needs assistance Sitting-balance support: No upper extremity supported;Feet supported Sitting balance-Leahy  Scale: Good     Standing balance support: No upper extremity supported;During functional activity Standing balance-Leahy Scale: Fair Standing balance comment: Able to maintain static standing without UE support                              Pertinent Vitals/Pain Pain Assessment: 0-10 Pain Score: 5  Pain Location: headache Pain Descriptors / Indicators: Dull;Operative site guarding Pain Intervention(s): Limited activity within patient's tolerance;Monitored during session;Repositioned    Home Living Family/patient expects to be discharged to:: Private residence Living Arrangements: Spouse/significant other Available Help at Discharge: Family;Available 24 hours/day Type of Home: House Home Access: Stairs to enter Entrance Stairs-Rails: Chemical engineer of Steps: 4 Home Layout: One level Home Equipment: Environmental consultant - 2 wheels;Cane - single point;Grab bars - tub/shower      Prior Function Level of Independence: Independent               Hand Dominance        Extremity/Trunk Assessment   Upper Extremity Assessment Upper Extremity Assessment: Defer to OT evaluation    Lower Extremity Assessment Lower Extremity Assessment: Generalized weakness    Cervical / Trunk Assessment Cervical / Trunk Assessment: Normal  Communication   Communication: No difficulties  Cognition Arousal/Alertness: Awake/alert Behavior During Therapy: WFL for tasks assessed/performed Overall Cognitive Status: Within Functional Limits for tasks assessed  General Comments General comments (skin integrity, edema, etc.): Pt's wife present throughout session.     Exercises     Assessment/Plan    PT Assessment Patient needs continued PT services  PT Problem List Decreased strength;Decreased balance;Decreased mobility;Decreased knowledge of precautions;Pain       PT Treatment Interventions Gait  training;Functional mobility training;Therapeutic activities;Stair training;Balance training;Therapeutic exercise;Patient/family education    PT Goals (Current goals can be found in the Care Plan section)  Acute Rehab PT Goals Patient Stated Goal: to go home PT Goal Formulation: With patient Time For Goal Achievement: 07/21/19 Potential to Achieve Goals: Good    Frequency Min 5X/week   Barriers to discharge        Co-evaluation               AM-PAC PT "6 Clicks" Mobility  Outcome Measure Help needed turning from your back to your side while in a flat bed without using bedrails?: None Help needed moving from lying on your back to sitting on the side of a flat bed without using bedrails?: A Little Help needed moving to and from a bed to a chair (including a wheelchair)?: A Little Help needed standing up from a chair using your arms (e.g., wheelchair or bedside chair)?: A Little Help needed to walk in hospital room?: A Little Help needed climbing 3-5 steps with a railing? : A Little 6 Click Score: 19    End of Session Equipment Utilized During Treatment: Gait belt Activity Tolerance: Patient tolerated treatment well Patient left: in bed;with call bell/phone within reach;with family/visitor present Nurse Communication: Mobility status PT Visit Diagnosis: Unsteadiness on feet (R26.81)    Time: 1400-1415 PT Time Calculation (min) (ACUTE ONLY): 15 min   Charges:   PT Evaluation $PT Eval Low Complexity: Shorter, PT, DPT  Acute Rehabilitation Services  Pager: [phone] Office: [phone]   Rudean Hitt 07/07/2019, 3:34 PM

## 2019-07-07 NOTE — Progress Notes (Signed)
Patient is awake, alert, cooperative.  He speaks with fluent speech and moves all extremities with good power.  Head CT shows well-positioned electrode with moderate intracranial air and without any complicating features.

## 2019-07-08 ENCOUNTER — Encounter (HOSPITAL_COMMUNITY): Payer: Self-pay | Admitting: Neurosurgery

## 2019-07-08 LAB — GLUCOSE, CAPILLARY
Glucose-Capillary: 173 mg/dL — ABNORMAL HIGH (ref 70–99)
Glucose-Capillary: 223 mg/dL — ABNORMAL HIGH (ref 70–99)
Glucose-Capillary: 236 mg/dL — ABNORMAL HIGH (ref 70–99)
Glucose-Capillary: 184 mg/dL — ABNORMAL HIGH (ref 70–99)

## 2019-07-08 MED ORDER — PROMETHAZINE HCL 25 MG/ML IJ SOLN: 12.5000 mg | Freq: Four times a day (QID) | INTRAMUSCULAR | Status: DC | PRN

## 2019-07-08 MED ORDER — HYDROXYZINE HCL 50 MG/ML IM SOLN
50.0000 mg | Freq: Four times a day (QID) | INTRAMUSCULAR | Status: DC | PRN
  Administered 2019-07-08: 50 mg via INTRAMUSCULAR
  Filled 2019-07-08: qty 1

## 2019-07-08 MED ORDER — PROMETHAZINE HCL 25 MG PO TABS
25.0000 mg | ORAL_TABLET | Freq: Four times a day (QID) | ORAL | Status: DC | PRN
  Administered 2019-07-08: 12:00:00 25 mg via ORAL
  Filled 2019-07-08: qty 1

## 2019-07-08 MED ORDER — ACETAMINOPHEN 500 MG PO TABS
500.0000 mg | ORAL_TABLET | Freq: Four times a day (QID) | ORAL | Status: DC | PRN
  Administered 2019-07-08 – 2019-07-09 (×2): 1000 mg via ORAL
  Filled 2019-07-08 (×2): qty 2

## 2019-07-08 MED ORDER — DEXTROSE 5 % AND 0.45 % NACL IV BOLUS
500.0000 mL | Freq: Once | INTRAVENOUS | Status: AC
  Administered 2019-07-08: 09:00:00 500 mL via INTRAVENOUS

## 2019-07-08 MED ORDER — KCL IN DEXTROSE-NACL 20-5-0.45 MEQ/L-%-% IV SOLN
INTRAVENOUS | Status: DC
  Administered 2019-07-08: 09:00:00 via INTRAVENOUS
  Filled 2019-07-08: qty 1000

## 2019-07-08 MED ORDER — PANTOPRAZOLE SODIUM 40 MG PO TBEC
40.0000 mg | DELAYED_RELEASE_TABLET | Freq: Every day | ORAL | Status: DC
  Administered 2019-07-08: 21:00:00 40 mg via ORAL
  Filled 2019-07-08: qty 1

## 2019-07-08 NOTE — Evaluation (Signed)
Occupational Therapy Evaluation Patient Details Name: Joshua [name] FULL MRN: [id number] DOB: 1950-12-13 Today's Date: 07/08/2019    History of Present Illness Pt is a 68 y/o male s/p L subthalamic stimulator insertion. PMH includes OSA on CPAP, HTN, CVA, gout, and L TKA.    Clinical Impression   Pt PTA: living with spouse and independent with BUE tremors. Pt currently with mild L hand tremors. Pt performing ADL functional transfers and mobility with no AD, fair balance and supervisionA for stairs, transferring in and out of walk in shower and mobility in room/hallway. Pt continues to have headache pain. Pt modified independence with ADL tasks with increased time. Pt at baseline functional level with supportive spouse. Pt does not require continued OT skilled services. OT signing off.      Follow Up Recommendations  No OT follow up    Equipment Recommendations  None recommended by OT    Recommendations for Other Services       Precautions / Restrictions Precautions Precautions: Fall Restrictions Weight Bearing Restrictions: No      Mobility Bed Mobility Overal bed mobility: Needs Assistance Bed Mobility: Supine to Sit;Sit to Supine     Supine to sit: Supervision Sit to supine: Supervision   General bed mobility comments: Supervision for safety.   Transfers Overall transfer level: Needs assistance Equipment used: None Transfers: Sit to/from Stand Sit to Stand: Supervision              Balance Overall balance assessment: Needs assistance Sitting-balance support: No upper extremity supported;Feet supported Sitting balance-Leahy Scale: Good     Standing balance support: No upper extremity supported;During functional activity Standing balance-Leahy Scale: Fair Standing balance comment: Pt managing change in direction, stairs and shower transfer with independence                           ADL either performed or assessed with clinical judgement   ADL  Overall ADL's : At baseline                                       General ADL Comments: Increased time and concentration     Vision Baseline Vision/History: Wears glasses Wears Glasses: At all times Patient Visual Report: No change from baseline Vision Assessment?: No apparent visual deficits     Perception     Praxis      Pertinent Vitals/Pain Pain Assessment: 0-10 Pain Score: 3  Pain Location: headache Pain Descriptors / Indicators: Dull;Operative site guarding Pain Intervention(s): Monitored during session     Hand Dominance Right   Extremity/Trunk Assessment Upper Extremity Assessment Upper Extremity Assessment: Generalized weakness;LUE deficits/detail LUE Deficits / Details: tremors present LUE Coordination: decreased fine motor   Lower Extremity Assessment Lower Extremity Assessment: Overall WFL for tasks assessed   Cervical / Trunk Assessment Cervical / Trunk Assessment: Normal   Communication Communication Communication: No difficulties   Cognition Arousal/Alertness: Awake/alert Behavior During Therapy: WFL for tasks assessed/performed Overall Cognitive Status: Within Functional Limits for tasks assessed                                     General Comments  Pt's spouse present    Exercises     Shoulder Instructions      Home Living Family/patient expects to be discharged  to:: Private residence Living Arrangements: Spouse/significant other Available Help at Discharge: Family;Available 24 hours/day Type of Home: House Home Access: Stairs to enter [employer] of Steps: 4 Entrance Stairs-Rails: Left;Right Home Layout: One level     Bathroom Shower/Tub: Occupational psychologist: Handicapped height     Home Equipment: Environmental consultant - 2 wheels;Cane - single point;Grab bars - tub/shower          Prior Functioning/Environment Level of Independence: Independent                 OT Problem  List: Decreased activity tolerance      OT Treatment/Interventions:      OT Goals(Current goals can be found in the care plan section) Acute Rehab OT Goals Patient Stated Goal: to go home  OT Frequency:     Barriers to D/C:            Co-evaluation              AM-PAC OT "6 Clicks" Daily Activity     Outcome Measure Help from another person eating meals?: None Help from another person taking care of personal grooming?: None Help from another person toileting, which includes using toliet, bedpan, or urinal?: None Help from another person bathing (including washing, rinsing, drying)?: A Little Help from another person to put on and taking off regular upper body clothing?: None Help from another person to put on and taking off regular lower body clothing?: None 6 Click Score: 23   End of Session Equipment Utilized During Treatment: Gait belt Nurse Communication: Mobility status  Activity Tolerance: Patient tolerated treatment well Patient left: in bed;with call bell/phone within reach;with family/visitor present  OT Visit Diagnosis: Muscle weakness (generalized) (M62.81)                Time: 8295-6213 OT Time Calculation (min): 22 min Charges:  OT General Charges $OT Visit: 1 Visit OT Evaluation $OT Eval Moderate Complexity: 1 Mod  Darryl Nestle) Marsa Aris OTR/L Acute Rehabilitation Services Pager: [phone] Office: [place] 07/08/2019, 2:19 PM

## 2019-07-08 NOTE — Progress Notes (Addendum)
Subjective: Patient reports "I had a rough night"  Objective: Vital signs in last 24 hours: Temp:  [97.9 F (36.6 C)-99.1 F (37.3 C)] 98.5 F (36.9 C) (08/14 0724) Pulse Rate:  [74-96] 85 (08/14 0724) Resp:  [14-20] 16 (08/14 0724) BP: (124-161)/(61-95) 134/87 (08/14 0724) SpO2:  [95 %-100 %] 95 % (08/14 0724)  Intake/Output from previous day: 08/13 0701 - 08/14 0700 In: 1180 [P.O.:480; I.V.:600; IV Piggyback:100] Out: 302 [Urine:300; Emesis/NG output:2] Intake/Output this shift: No intake/output data recorded.  Alert, conversant, reporting nausea and vomiting overnight. Persistent headache, no worse than late yeaterday. MAEW. No deficits.Scalp drsgs intact without beeding.  Lab Results: Recent Labs    07/07/19 0559  WBC 11.6*  HGB 13.5  HCT 41.3  PLT 299   BMET Recent Labs    07/07/19 0559  NA 139  K 3.9  CL 104  CO2 23  GLUCOSE 145*  BUN 24*  CREATININE 1.78*  CALCIUM 9.1    Studies/Results: Ct Head Wo Contrast  Result Date: 07/07/2019 CLINICAL DATA:  Postop deep brain stimulator EXAM: CT HEAD WITHOUT CONTRAST TECHNIQUE: Contiguous axial images were obtained from the base of the skull through the vertex without intravenous contrast. COMPARISON:  CT head 06/30/2019 FINDINGS: Brain: Interval placement of unilateral left-sided deep brain stimulator with the lead tip in the region the left thalamus. No associated acute hemorrhage or infarct. Mild-to-moderate pneumocephalus anteriorly bilaterally. Burr hole over the right parietal convexity without lead placement on the right. Mild atrophy. Hypodensity in the genu of the corpus callosum and frontal lobes bilaterally appears chronic and unchanged. Chronic ischemic changes in the corona radiata on the right unchanged. Ventricle size normal. Negative for midline shift. Vascular: Negative for hyperdense vessel. Skull: Parietal burr holes bilaterally Sinuses/Orbits: Paranasal sinuses clear. Bilateral cataract surgery. None 3  mm metal foreign body overlying the left orbit. This may be in the cornea or in the eyelid. This was present on the prior MRI based on the artifact. Recommend removal prior to future MRI. Other: None IMPRESSION: Unilateral deep brain stimulator in good position left thalamus. No acute hemorrhage. Mild-to-moderate pneumocephalus anteriorly bilaterally. No midline shift. Chronic ischemic changes, stable from prior study 3 mm metal foreign body left orbit, recommend removal prior to future MRI. Electronically Signed   By: Franchot Gallo M.D.   On: 07/07/2019 11:15    Assessment/Plan:   LOS: 1 day  Will continue to work on nausea this am. Per dr. Vertell Limber, may try Vistaryl, may also try Phenergan. IV D5 0.45 with 10mEqK  bolus of 593ml followed by 29ml/hr   [place], Aaron Edelman 07/08/2019, 7:39 AM   Will work on nausea this morning.  Home when feeling better.

## 2019-07-08 NOTE — Progress Notes (Signed)
PT Cancellation Note  Patient Details Name: Joshua [name] MRN: [id number] DOB: 1950/12/08   Cancelled Treatment:    Reason Eval/Treat Not Completed: Other (comment)(Pt at level wife can assist at home)  Attempted several times today but pt was nauseated in AM and sleeping much of pm.  OT practiced stairs with pt. I talked with his wife this pm and she said they have no questions regarding mobility and feel safe for DC home.   Will sign off. Thanks!   Melvern Banker 07/08/2019, 4:38 PM  Lavonia Dana, PT   Acute Rehabilitation Services  Pager [phone] Office [phone] 07/08/2019

## 2019-07-08 NOTE — Progress Notes (Signed)
OT Cancellation Note  Patient Details Name: BRIX BREARLEY MRN: [id number] DOB: 1951/09/01   Cancelled Treatment:    Reason Eval/Treat Not Completed: Other (comment)(Pt not feeling well- nauseous and asking RN for meds.) Pt to be seen at next available treatment time.   Ebony Hail Harold Hedge) Marsa Aris OTR/L Acute Rehabilitation Services Pager: [phone] Office: [place] 07/08/2019, 8:16 AM

## 2019-07-09 LAB — GLUCOSE, CAPILLARY: Glucose-Capillary: 129 mg/dL — ABNORMAL HIGH (ref 70–99)

## 2019-07-09 NOTE — Plan of Care (Signed)
Patient alert and oriented, mae's well, voiding adequate amount of urine, swallowing without difficulty, no c/o pain at time of discharge. Patient discharged home with family. Script and discharged instructions given to patient. Patient and family stated understanding of instructions given. Patient has an appointment with Dr.Stern    

## 2019-07-09 NOTE — Discharge Summary (Signed)
Physician Discharge Summary  Patient ID: Joshua [name] MRN: [id number] DOB/AGE: December 14, 1950 67 y.o.  Admit date: 07/07/2019 Discharge date: 07/09/2019  Admission Diagnoses: tremor    Discharge Diagnoses: same   Discharged Condition: good  Hospital Course: The patient was admitted on 07/07/2019 and taken to the operating room where the patient underwent DBS. The patient tolerated the procedure well and was taken to the recovery room and then to the floor in stable condition. The hospital course was routine. There were no complications. The wound remained clean dry and intact. Pt had appropriate scalp soreness. No complaints of arm pain or new N/T/W. The patient remained afebrile with stable vital signs, and tolerated a regular diet. The patient continued to increase activities, and pain was well controlled with oral pain medications.   Consults: None  Significant Diagnostic Studies:  Results for orders placed or performed during the hospital encounter of 02/77/41  Basic metabolic panel  Result Value Ref Range   Sodium 139 135 - 145 mmol/L   Potassium 3.9 3.5 - 5.1 mmol/L   Chloride 104 98 - 111 mmol/L   CO2 23 22 - 32 mmol/L   Glucose, Bld 145 (H) 70 - 99 mg/dL   BUN 24 (H) 8 - 23 mg/dL   Creatinine, Ser 1.78 (H) 0.61 - 1.24 mg/dL   Calcium 9.1 8.9 - 10.3 mg/dL   GFR calc non Af Amer 38 (L) >60 mL/min   GFR calc Af Amer 44 (L) >60 mL/min   Anion gap 12 5 - 15  CBC  Result Value Ref Range   WBC 11.6 (H) 4.0 - 10.5 K/uL   RBC 4.54 4.22 - 5.81 MIL/uL   Hemoglobin 13.5 13.0 - 17.0 g/dL   HCT 41.3 39.0 - 52.0 %   MCV 91.0 80.0 - 100.0 fL   MCH 29.7 26.0 - 34.0 pg   MCHC 32.7 30.0 - 36.0 g/dL   RDW 12.8 11.5 - 15.5 %   Platelets 299 150 - 400 K/uL   nRBC 0.0 0.0 - 0.2 %  Glucose, capillary  Result Value Ref Range   Glucose-Capillary 149 (H) 70 - 99 mg/dL  Glucose, capillary  Result Value Ref Range   Glucose-Capillary 139 (H) 70 - 99 mg/dL   Comment 1 Notify RN   Glucose,  capillary  Result Value Ref Range   Glucose-Capillary 161 (H) 70 - 99 mg/dL   Comment 1 Notify RN    Comment 2 Document in Chart   Glucose, capillary  Result Value Ref Range   Glucose-Capillary 145 (H) 70 - 99 mg/dL  Glucose, capillary  Result Value Ref Range   Glucose-Capillary 173 (H) 70 - 99 mg/dL   Comment 1 Notify RN    Comment 2 Document in Chart   Glucose, capillary  Result Value Ref Range   Glucose-Capillary 223 (H) 70 - 99 mg/dL   Comment 1 Notify RN    Comment 2 Document in Chart   Glucose, capillary  Result Value Ref Range   Glucose-Capillary 236 (H) 70 - 99 mg/dL   Comment 1 Notify RN    Comment 2 Document in Chart   Glucose, capillary  Result Value Ref Range   Glucose-Capillary 184 (H) 70 - 99 mg/dL  Glucose, capillary  Result Value Ref Range   Glucose-Capillary 129 (H) 70 - 99 mg/dL  Type and screen  Result Value Ref Range   ABO/RH(D) A POS    Antibody Screen NEG    Sample Expiration  07/10/2019,2359 Performed at [facility] [address] [place], [place] [address]   ABO/Rh  Result Value Ref Range   ABO/RH(D)      A POS Performed at [facility] [address], [place], [place] [address]     Ct Head Wo Contrast  Result Date: 07/07/2019 CLINICAL DATA:  Postop deep brain stimulator EXAM: CT HEAD WITHOUT CONTRAST TECHNIQUE: Contiguous axial images were obtained from the base of the skull through the vertex without intravenous contrast. COMPARISON:  CT head 06/30/2019 FINDINGS: Brain: Interval placement of unilateral left-sided deep brain stimulator with the lead tip in the region the left thalamus. No associated acute hemorrhage or infarct. Mild-to-moderate pneumocephalus anteriorly bilaterally. Burr hole over the right parietal convexity without lead placement on the right. Mild atrophy. Hypodensity in the genu of the corpus callosum and frontal lobes bilaterally appears chronic and unchanged. Chronic ischemic changes in the corona  radiata on the right unchanged. Ventricle size normal. Negative for midline shift. Vascular: Negative for hyperdense vessel. Skull: Parietal burr holes bilaterally Sinuses/Orbits: Paranasal sinuses clear. Bilateral cataract surgery. None 3 mm metal foreign body overlying the left orbit. This may be in the cornea or in the eyelid. This was present on the prior MRI based on the artifact. Recommend removal prior to future MRI. Other: None IMPRESSION: Unilateral deep brain stimulator in good position left thalamus. No acute hemorrhage. Mild-to-moderate pneumocephalus anteriorly bilaterally. No midline shift. Chronic ischemic changes, stable from prior study 3 mm metal foreign body left orbit, recommend removal prior to future MRI. Electronically Signed   By: Franchot Gallo M.D.   On: 07/07/2019 11:15   Ct Head Wo Contrast  Result Date: 06/30/2019 CLINICAL DATA:  Essential tremor.  Pre deep brain stimulator EXAM: CT HEAD WITHOUT CONTRAST TECHNIQUE: Contiguous axial images were obtained from the base of the skull through the vertex without intravenous contrast. COMPARISON:  CT head 02/02/2014 FINDINGS: Brain: Deep brain stimulator protocol with 1 mm axial images. Ventricle size normal. Patchy hypodensity in the frontal white matter bilaterally appears to have progressed. Small lacunar infarcts in the deep white matter on the right unchanged. Chronic lacunar infarction right thalamus. Vascular: Negative for hyperdense vessel Skull: Fiducial markers in the frontal and parietal bones bilaterally. No acute skull abnormality. Sinuses/Orbits: Paranasal sinuses clear. Bilateral cataract surgery. 1 x 4 mm metal foreign body in the left lower eyelid anterior to the globe. This was present previously on the CT in 2015. The patient has had MRI of the head since that time with mild artifact from the metal. Other: None IMPRESSION: Deep brain stimulator protocol Chronic ischemic change in the white matter. No acute infarct or mass.  Fiducials in the frontal and parietal bones bilaterally. Metal foreign body in the left lower eyelid. Electronically Signed   By: Franchot Gallo M.D.   On: 06/30/2019 13:39    Antibiotics:  Anti-infectives (From admission, onward)   Start     Dose/Rate Route Frequency Ordered Stop   07/07/19 1600  ceFAZolin (ANCEF) IVPB 2g/100 mL premix     2 g 200 mL/hr over 30 Minutes Intravenous Every 8 hours 07/07/19 1212 07/07/19 2252   07/07/19 0630  ceFAZolin (ANCEF) 3 g in dextrose 5 % 50 mL IVPB     3 g 100 mL/hr over 30 Minutes Intravenous  Once 07/07/19 0625 07/07/19 0808      Discharge Exam: Blood pressure (!) 147/82, pulse 75, temperature 98.6 F (37 C), temperature source Oral, resp. rate 16,  SpO2 97 %. Neurologic: Grossly normal Dressings dry  Discharge Medications:   Allergies as of 07/09/2019   No Known Allergies     Medication List    TAKE these medications   amLODipine 5 MG tablet Commonly known as: NORVASC Take 5 mg by mouth every evening.   aspirin 81 MG tablet Take 1 tablet (81 mg total) by mouth daily.   atorvastatin 40 MG tablet Commonly known as: LIPITOR Take 40 mg by mouth every evening.   cephALEXin 500 MG capsule Commonly known as: Keflex Take 1 capsule (500 mg total) by mouth 3 (three) times daily.   DULoxetine 60 MG capsule Commonly known as: CYMBALTA Take 60 mg by mouth daily.   fenofibrate 54 MG tablet Take 54 mg by mouth daily.   Fish Oil 1000 MG Caps Take 2,000 mg by mouth daily. Reported on 02/13/2016   HumaLOG KwikPen 100 UNIT/ML KwikPen Generic drug: insulin lispro Inject 25 Units into the skin 3 (three) times daily with meals.   hydrochlorothiazide 25 MG tablet Commonly known as: HYDRODIURIL Take 25 mg by mouth daily.   losartan 100 MG tablet Commonly known as: COZAAR Take 100 mg by mouth daily.   Lutein-Zeaxanthin 25-5 MG Caps Take 1 tablet by mouth daily.   metFORMIN 1000 MG tablet Commonly known as: GLUCOPHAGE Take 1,000 mg  by mouth 2 (two) times daily with a meal.   multivitamin with minerals Tabs tablet Take 1 tablet by mouth daily.   mupirocin ointment 2 % Commonly known as: BACTROBAN Apply 1 application topically 2 (two) times daily.   OneTouch Verio test strip Generic drug: glucose blood   SYSTANE OP Place 1-2 drops into both eyes 4 (four) times daily as needed (dry eyes).   Toujeo SoloStar 300 UNIT/ML Sopn Generic drug: Insulin Glargine (1 Unit Dial) Inject 80 Units into the skin 2 (two) times daily. Breakfast and bedtime       Disposition: home   Final Dx: DBS  Discharge Instructions     Remove dressing in 72 hours   Complete by: As directed    Call MD for:  difficulty breathing, headache or visual disturbances   Complete by: As directed    Call MD for:  persistant nausea and vomiting   Complete by: As directed    Call MD for:  redness, tenderness, or signs of infection (pain, swelling, redness, odor or green/yellow discharge around incision site)   Complete by: As directed    Call MD for:  severe uncontrolled pain   Complete by: As directed    Call MD for:  temperature >100.4   Complete by: As directed    Diet - low sodium heart healthy   Complete by: As directed    Increase activity slowly   Complete by: As directed          Signed: Eustace Moore 07/09/2019, 8:15 AM

## 2019-07-11 ENCOUNTER — Other Ambulatory Visit (HOSPITAL_COMMUNITY): Payer: Self-pay

## 2019-07-11 ENCOUNTER — Other Ambulatory Visit (HOSPITAL_COMMUNITY)
Admission: RE | Admit: 2019-07-11 | Discharge: 2019-07-11 | Disposition: A | Discharge status: Home / Self Care | Payer: Medicare Other | Source: Ambulatory Visit | Attending: Neurosurgery | Admitting: Neurosurgery

## 2019-07-11 DIAGNOSIS — Z20828 Contact with and (suspected) exposure to other viral communicable diseases: Secondary | ICD-10-CM | POA: Diagnosis not present

## 2019-07-11 DIAGNOSIS — Z01812 Encounter for preprocedural laboratory examination: Secondary | ICD-10-CM | POA: Insufficient documentation

## 2019-07-11 LAB — SARS CORONAVIRUS 2 (TAT 6-24 HRS): SARS Coronavirus 2: NEGATIVE

## 2019-07-11 NOTE — Op Note (Addendum)
Preoperative diagnosis:  Essential Tremor Postoperative diagnosis:  same Surgeon:  Erline Levine Neurologist:  Wells Guiles Tat  Indication for procedure: Determination of optimal electrode position for deep brain stimulation therapy.  Description of procedure:  Following the incision and burr hole placement, a recording microelectrode was slowly advanced into the brain via a motorized drive.  Beginning approximately 10 mm above the target, recordings were periodically made, and the resultant brain activity was described on the neurophysiologic recording worksheet.    A total of one recording pass was obtained on the left side of the brain.    8 mm of VIM were recorded from the left side of the brain with tremor cells and kinetic response noted.  Following the recording, the recording electrode was replaced by a stimulating electrode by Dr. Vertell Limber.  Test stimulations were then obtained.  Active contacts that were used and the response to stimulation, including both adverse effects and therapeutic effects, were recorded on the neurophysiologic stimulation worksheet.  In brief, there was complete resolution of  tremor following activation and stimulation.  Adverse effects only occurred at supratherapeutic voltages and consisted of lip/tongue paresthesias.  Following completion of this side and before recording the opposite side of the brain, the patient became nauseated with dry heaves with a vagal response.  He had loss of CSF.  It was felt that accuracy for target position would have been compromised due to this and the decision was made to not place an electrode on the right side of the brain.  Joshua Bogus, DO [facility] Director of Movement Disorders  Addended DOS/note time as was originally placed under wrong encounter and wrong DOS.

## 2019-07-13 ENCOUNTER — Other Ambulatory Visit: Payer: Self-pay

## 2019-07-13 ENCOUNTER — Encounter (HOSPITAL_COMMUNITY): Payer: Self-pay | Admitting: *Deleted

## 2019-07-13 MED ORDER — DEXTROSE 5 % IV SOLN
3.0000 g | INTRAVENOUS | Status: AC
  Administered 2019-07-14: 3 g via INTRAVENOUS
  Filled 2019-07-13: qty 3

## 2019-07-13 NOTE — Progress Notes (Signed)
Pt. Denies any chest pain, shortness of breath. Pt. Instructed on NPO at midnight. Instructed on medications to take. Pt. To take 40 units of Toujeo tonight and 40 units in A.M. If sugar is <70 in the morning, not to take any insulin and drink 1/2 cup apple juice/cranberry juice or use glucose tabs or gel. Recheck sugar in 15 minutes and if not greater than 70 call our number for further instructions. Only use sliding scale if blood sugar is > 220 and can take 1/2 of his usual dose.

## 2019-07-14 ENCOUNTER — Observation Stay (HOSPITAL_COMMUNITY)
Admission: RE | Admit: 2019-07-14 | Discharge: 2019-07-14 | Disposition: A | Discharge status: Home / Self Care | Payer: Medicare Other | Attending: Neurosurgery | Admitting: Neurosurgery

## 2019-07-14 ENCOUNTER — Ambulatory Visit (HOSPITAL_COMMUNITY): Payer: Medicare Other | Admitting: Anesthesiology

## 2019-07-14 ENCOUNTER — Encounter (HOSPITAL_COMMUNITY): Payer: Self-pay | Admitting: Surgery

## 2019-07-14 ENCOUNTER — Encounter (HOSPITAL_COMMUNITY)
Admission: RE | Disposition: A | Discharge status: Home / Self Care | Payer: Self-pay | Source: Home / Self Care | Attending: Neurosurgery

## 2019-07-14 DIAGNOSIS — Z85820 Personal history of malignant melanoma of skin: Secondary | ICD-10-CM | POA: Insufficient documentation

## 2019-07-14 DIAGNOSIS — Z794 Long term (current) use of insulin: Secondary | ICD-10-CM | POA: Diagnosis not present

## 2019-07-14 DIAGNOSIS — Z96652 Presence of left artificial knee joint: Secondary | ICD-10-CM | POA: Insufficient documentation

## 2019-07-14 DIAGNOSIS — Z87891 Personal history of nicotine dependence: Secondary | ICD-10-CM | POA: Insufficient documentation

## 2019-07-14 DIAGNOSIS — G4733 Obstructive sleep apnea (adult) (pediatric): Secondary | ICD-10-CM | POA: Diagnosis not present

## 2019-07-14 DIAGNOSIS — I10 Essential (primary) hypertension: Secondary | ICD-10-CM | POA: Diagnosis not present

## 2019-07-14 DIAGNOSIS — E119 Type 2 diabetes mellitus without complications: Secondary | ICD-10-CM | POA: Insufficient documentation

## 2019-07-14 DIAGNOSIS — Z7982 Long term (current) use of aspirin: Secondary | ICD-10-CM | POA: Diagnosis not present

## 2019-07-14 DIAGNOSIS — G250 Essential tremor: Secondary | ICD-10-CM | POA: Diagnosis not present

## 2019-07-14 DIAGNOSIS — Z79899 Other long term (current) drug therapy: Secondary | ICD-10-CM | POA: Insufficient documentation

## 2019-07-14 DIAGNOSIS — Z8673 Personal history of transient ischemic attack (TIA), and cerebral infarction without residual deficits: Secondary | ICD-10-CM | POA: Insufficient documentation

## 2019-07-14 DIAGNOSIS — R251 Tremor, unspecified: Secondary | ICD-10-CM | POA: Diagnosis present

## 2019-07-14 HISTORY — PX: PULSE GENERATOR IMPLANT: SHX5370

## 2019-07-14 LAB — GLUCOSE, CAPILLARY
Glucose-Capillary: 178 mg/dL — ABNORMAL HIGH (ref 70–99)
Glucose-Capillary: 150 mg/dL — ABNORMAL HIGH (ref 70–99)

## 2019-07-14 LAB — HEMOGLOBIN A1C
Hgb A1c MFr Bld: 7.7 % — ABNORMAL HIGH (ref 4.8–5.6)
Mean Plasma Glucose: 174.29 mg/dL

## 2019-07-14 SURGERY — BILATERAL PULSE GENERATOR IMPLANT
Anesthesia: General | Site: Head

## 2019-07-14 MED ORDER — ALUM & MAG HYDROXIDE-SIMETH 200-200-20 MG/5ML PO SUSP: 30.0000 mL | Freq: Four times a day (QID) | ORAL | Status: DC | PRN

## 2019-07-14 MED ORDER — PHENYLEPHRINE 40 MCG/ML (10ML) SYRINGE FOR IV PUSH (FOR BLOOD PRESSURE SUPPORT)
PREFILLED_SYRINGE | INTRAVENOUS | Status: AC
  Filled 2019-07-14: qty 10

## 2019-07-14 MED ORDER — 0.9 % SODIUM CHLORIDE (POUR BTL) OPTIME
TOPICAL | Status: DC | PRN
  Administered 2019-07-14: 1000 mL

## 2019-07-14 MED ORDER — MUPIROCIN 2 % EX OINT: 1.0000 "application " | TOPICAL_OINTMENT | Freq: Two times a day (BID) | CUTANEOUS | Status: DC

## 2019-07-14 MED ORDER — ROCURONIUM BROMIDE 10 MG/ML (PF) SYRINGE
PREFILLED_SYRINGE | INTRAVENOUS | Status: DC | PRN
  Administered 2019-07-14: 30 mg via INTRAVENOUS

## 2019-07-14 MED ORDER — INSULIN GLARGINE (1 UNIT DIAL) 300 UNIT/ML ~~LOC~~ SOPN: 80.0000 [IU] | PEN_INJECTOR | Freq: Two times a day (BID) | SUBCUTANEOUS | Status: DC

## 2019-07-14 MED ORDER — VANCOMYCIN HCL 1000 MG IV SOLR
INTRAVENOUS | Status: AC
  Filled 2019-07-14: qty 1000

## 2019-07-14 MED ORDER — ACETAMINOPHEN 650 MG RE SUPP: 650.0000 mg | RECTAL | Status: DC | PRN

## 2019-07-14 MED ORDER — BUPIVACAINE HCL (PF) 0.5 % IJ SOLN
INTRAMUSCULAR | Status: DC | PRN
  Administered 2019-07-14: 8.5 mL

## 2019-07-14 MED ORDER — ONDANSETRON HCL 4 MG/2ML IJ SOLN: 4.0000 mg | Freq: Four times a day (QID) | INTRAMUSCULAR | Status: DC | PRN

## 2019-07-14 MED ORDER — LUTEIN-ZEAXANTHIN 25-5 MG PO CAPS: 1.0000 | ORAL_CAPSULE | Freq: Every day | ORAL | Status: DC

## 2019-07-14 MED ORDER — FENTANYL CITRATE (PF) 250 MCG/5ML IJ SOLN
INTRAMUSCULAR | Status: AC
  Filled 2019-07-14: qty 5

## 2019-07-14 MED ORDER — PHENOL 1.4 % MT LIQD: 1.0000 | OROMUCOSAL | Status: DC | PRN

## 2019-07-14 MED ORDER — METFORMIN HCL 500 MG PO TABS: 1000.0000 mg | ORAL_TABLET | Freq: Two times a day (BID) | ORAL | Status: DC

## 2019-07-14 MED ORDER — LACTATED RINGERS IV SOLN
INTRAVENOUS | Status: DC | PRN
  Administered 2019-07-14 (×2): via INTRAVENOUS

## 2019-07-14 MED ORDER — ONDANSETRON HCL 4 MG/2ML IJ SOLN
INTRAMUSCULAR | Status: DC | PRN
  Administered 2019-07-14: 4 mg via INTRAVENOUS

## 2019-07-14 MED ORDER — BACITRACIN ZINC 500 UNIT/GM EX OINT
TOPICAL_OINTMENT | CUTANEOUS | Status: AC
  Filled 2019-07-14: qty 28.35

## 2019-07-14 MED ORDER — LIDOCAINE-EPINEPHRINE 1 %-1:100000 IJ SOLN
INTRAMUSCULAR | Status: AC
  Filled 2019-07-14: qty 1

## 2019-07-14 MED ORDER — THROMBIN 5000 UNITS EX SOLR
CUTANEOUS | Status: AC
  Filled 2019-07-14: qty 5000

## 2019-07-14 MED ORDER — FENOFIBRATE 54 MG PO TABS
54.0000 mg | ORAL_TABLET | Freq: Every day | ORAL | Status: DC
  Filled 2019-07-14: qty 1

## 2019-07-14 MED ORDER — LIDOCAINE-EPINEPHRINE 1 %-1:100000 IJ SOLN
INTRAMUSCULAR | Status: DC | PRN
  Administered 2019-07-14: 8.5 mL

## 2019-07-14 MED ORDER — POLYVINYL ALCOHOL 1.4 % OP SOLN: 1.0000 [drp] | Freq: Four times a day (QID) | OPHTHALMIC | Status: DC | PRN

## 2019-07-14 MED ORDER — CEFAZOLIN SODIUM-DEXTROSE 2-4 GM/100ML-% IV SOLN: 2.0000 g | Freq: Three times a day (TID) | INTRAVENOUS | Status: DC

## 2019-07-14 MED ORDER — FLEET ENEMA 7-19 GM/118ML RE ENEM: 1.0000 | ENEMA | Freq: Once | RECTAL | Status: DC | PRN

## 2019-07-14 MED ORDER — VANCOMYCIN HCL 1000 MG IV SOLR
INTRAVENOUS | Status: DC | PRN
  Administered 2019-07-14: 1000 mg via TOPICAL

## 2019-07-14 MED ORDER — SODIUM CHLORIDE 0.9 % IV SOLN
INTRAVENOUS | Status: DC | PRN
  Administered 2019-07-14: 40 ug/min via INTRAVENOUS

## 2019-07-14 MED ORDER — SUGAMMADEX SODIUM 200 MG/2ML IV SOLN
INTRAVENOUS | Status: DC | PRN
  Administered 2019-07-14: 200 mg via INTRAVENOUS

## 2019-07-14 MED ORDER — POLYETHYL GLYCOL-PROPYL GLYCOL 0.4-0.3 % OP GEL: Freq: Four times a day (QID) | OPHTHALMIC | Status: DC | PRN

## 2019-07-14 MED ORDER — OXYCODONE HCL 5 MG PO TABS: 5.0000 mg | ORAL_TABLET | Freq: Once | ORAL | Status: DC | PRN

## 2019-07-14 MED ORDER — ONDANSETRON HCL 4 MG/2ML IJ SOLN
INTRAMUSCULAR | Status: AC
  Filled 2019-07-14: qty 2

## 2019-07-14 MED ORDER — INSULIN GLARGINE 100 UNIT/ML ~~LOC~~ SOLN
80.0000 [IU] | Freq: Two times a day (BID) | SUBCUTANEOUS | Status: DC
  Filled 2019-07-14: qty 0.8

## 2019-07-14 MED ORDER — PROPOFOL 10 MG/ML IV BOLUS
INTRAVENOUS | Status: AC
  Filled 2019-07-14: qty 20

## 2019-07-14 MED ORDER — PANTOPRAZOLE SODIUM 40 MG PO TBEC: 40.0000 mg | DELAYED_RELEASE_TABLET | Freq: Every day | ORAL | Status: DC

## 2019-07-14 MED ORDER — ATORVASTATIN CALCIUM 40 MG PO TABS: 40.0000 mg | ORAL_TABLET | Freq: Every evening | ORAL | Status: DC

## 2019-07-14 MED ORDER — BISACODYL 10 MG RE SUPP: 10.0000 mg | Freq: Every day | RECTAL | Status: DC | PRN

## 2019-07-14 MED ORDER — CHLORHEXIDINE GLUCONATE CLOTH 2 % EX PADS: 6.0000 | MEDICATED_PAD | Freq: Once | CUTANEOUS | Status: DC

## 2019-07-14 MED ORDER — SUCCINYLCHOLINE CHLORIDE 200 MG/10ML IV SOSY
PREFILLED_SYRINGE | INTRAVENOUS | Status: AC
  Filled 2019-07-14: qty 10

## 2019-07-14 MED ORDER — LIDOCAINE 2% (20 MG/ML) 5 ML SYRINGE
INTRAMUSCULAR | Status: DC | PRN
  Administered 2019-07-14: 100 mg via INTRAVENOUS

## 2019-07-14 MED ORDER — HYDROCODONE-ACETAMINOPHEN 5-325 MG PO TABS: 1.0000 | ORAL_TABLET | ORAL | 0 refills | Status: DC | PRN

## 2019-07-14 MED ORDER — DEXAMETHASONE SODIUM PHOSPHATE 10 MG/ML IJ SOLN
INTRAMUSCULAR | Status: AC
  Filled 2019-07-14: qty 1

## 2019-07-14 MED ORDER — SODIUM CHLORIDE 0.9 % IV SOLN: 250.0000 mL | INTRAVENOUS | Status: DC

## 2019-07-14 MED ORDER — PANTOPRAZOLE SODIUM 40 MG IV SOLR: 40.0000 mg | Freq: Every day | INTRAVENOUS | Status: DC

## 2019-07-14 MED ORDER — MIDAZOLAM HCL 5 MG/5ML IJ SOLN
INTRAMUSCULAR | Status: DC | PRN
  Administered 2019-07-14: 2 mg via INTRAVENOUS

## 2019-07-14 MED ORDER — MENTHOL 3 MG MT LOZG: 1.0000 | LOZENGE | OROMUCOSAL | Status: DC | PRN

## 2019-07-14 MED ORDER — FENTANYL CITRATE (PF) 250 MCG/5ML IJ SOLN
INTRAMUSCULAR | Status: DC | PRN
  Administered 2019-07-14 (×4): 50 ug via INTRAVENOUS

## 2019-07-14 MED ORDER — HYDROCHLOROTHIAZIDE 25 MG PO TABS: 25.0000 mg | ORAL_TABLET | Freq: Every day | ORAL | Status: DC

## 2019-07-14 MED ORDER — INSULIN ASPART 100 UNIT/ML ~~LOC~~ SOLN: 0.0000 [IU] | Freq: Every day | SUBCUTANEOUS | Status: DC

## 2019-07-14 MED ORDER — ROCURONIUM BROMIDE 10 MG/ML (PF) SYRINGE
PREFILLED_SYRINGE | INTRAVENOUS | Status: AC
  Filled 2019-07-14: qty 10

## 2019-07-14 MED ORDER — LIDOCAINE 2% (20 MG/ML) 5 ML SYRINGE
INTRAMUSCULAR | Status: AC
  Filled 2019-07-14: qty 5

## 2019-07-14 MED ORDER — SODIUM CHLORIDE 0.9% FLUSH: 3.0000 mL | Freq: Two times a day (BID) | INTRAVENOUS | Status: DC

## 2019-07-14 MED ORDER — BUPIVACAINE HCL (PF) 0.5 % IJ SOLN
INTRAMUSCULAR | Status: AC
  Filled 2019-07-14: qty 30

## 2019-07-14 MED ORDER — ACETAMINOPHEN 325 MG PO TABS: 650.0000 mg | ORAL_TABLET | ORAL | Status: DC | PRN

## 2019-07-14 MED ORDER — AMLODIPINE BESYLATE 5 MG PO TABS: 5.0000 mg | ORAL_TABLET | Freq: Every evening | ORAL | Status: DC

## 2019-07-14 MED ORDER — ADULT MULTIVITAMIN W/MINERALS CH: 1.0000 | ORAL_TABLET | Freq: Every day | ORAL | Status: DC

## 2019-07-14 MED ORDER — ONDANSETRON HCL 4 MG/2ML IJ SOLN: 4.0000 mg | Freq: Once | INTRAMUSCULAR | Status: DC | PRN

## 2019-07-14 MED ORDER — PHENYLEPHRINE 40 MCG/ML (10ML) SYRINGE FOR IV PUSH (FOR BLOOD PRESSURE SUPPORT)
PREFILLED_SYRINGE | INTRAVENOUS | Status: DC | PRN
  Administered 2019-07-14: 40 ug via INTRAVENOUS
  Administered 2019-07-14 (×3): 80 ug via INTRAVENOUS

## 2019-07-14 MED ORDER — POLYETHYLENE GLYCOL 3350 17 G PO PACK: 17.0000 g | PACK | Freq: Every day | ORAL | Status: DC | PRN

## 2019-07-14 MED ORDER — INSULIN ASPART 100 UNIT/ML ~~LOC~~ SOLN: 0.0000 [IU] | Freq: Three times a day (TID) | SUBCUTANEOUS | Status: DC

## 2019-07-14 MED ORDER — FENTANYL CITRATE (PF) 100 MCG/2ML IJ SOLN
INTRAMUSCULAR | Status: AC
  Filled 2019-07-14: qty 2

## 2019-07-14 MED ORDER — BACITRACIN ZINC 500 UNIT/GM EX OINT
TOPICAL_OINTMENT | CUTANEOUS | Status: DC | PRN
  Administered 2019-07-14: 1 via TOPICAL

## 2019-07-14 MED ORDER — DOCUSATE SODIUM 100 MG PO CAPS: 100.0000 mg | ORAL_CAPSULE | Freq: Two times a day (BID) | ORAL | Status: DC

## 2019-07-14 MED ORDER — FENTANYL CITRATE (PF) 100 MCG/2ML IJ SOLN
25.0000 ug | INTRAMUSCULAR | Status: DC | PRN
  Administered 2019-07-14: 50 ug via INTRAVENOUS

## 2019-07-14 MED ORDER — ONDANSETRON HCL 4 MG PO TABS: 4.0000 mg | ORAL_TABLET | Freq: Four times a day (QID) | ORAL | Status: DC | PRN

## 2019-07-14 MED ORDER — OXYCODONE HCL 5 MG/5ML PO SOLN: 5.0000 mg | Freq: Once | ORAL | Status: DC | PRN

## 2019-07-14 MED ORDER — MORPHINE SULFATE (PF) 2 MG/ML IV SOLN: 2.0000 mg | INTRAVENOUS | Status: DC | PRN

## 2019-07-14 MED ORDER — MIDAZOLAM HCL 2 MG/2ML IJ SOLN
INTRAMUSCULAR | Status: AC
  Filled 2019-07-14: qty 2

## 2019-07-14 MED ORDER — THROMBIN 5000 UNITS EX SOLR
OROMUCOSAL | Status: DC | PRN
  Administered 2019-07-14: 5 mL via TOPICAL

## 2019-07-14 MED ORDER — HYDROCODONE-ACETAMINOPHEN 5-325 MG PO TABS: 2.0000 | ORAL_TABLET | ORAL | Status: DC | PRN

## 2019-07-14 MED ORDER — SUCCINYLCHOLINE CHLORIDE 20 MG/ML IJ SOLN
INTRAMUSCULAR | Status: DC | PRN
  Administered 2019-07-14: 140 mg via INTRAVENOUS

## 2019-07-14 MED ORDER — OXYCODONE HCL 5 MG PO TABS: 5.0000 mg | ORAL_TABLET | ORAL | Status: DC | PRN

## 2019-07-14 MED ORDER — KCL IN DEXTROSE-NACL 20-5-0.45 MEQ/L-%-% IV SOLN: INTRAVENOUS | Status: DC

## 2019-07-14 MED ORDER — DULOXETINE HCL 30 MG PO CPEP: 60.0000 mg | ORAL_CAPSULE | Freq: Every day | ORAL | Status: DC

## 2019-07-14 MED ORDER — PROPOFOL 10 MG/ML IV BOLUS
INTRAVENOUS | Status: DC | PRN
  Administered 2019-07-14: 150 mg via INTRAVENOUS

## 2019-07-14 MED ORDER — EPHEDRINE 5 MG/ML INJ
INTRAVENOUS | Status: AC
  Filled 2019-07-14: qty 10

## 2019-07-14 MED ORDER — SODIUM CHLORIDE 0.9% FLUSH: 3.0000 mL | INTRAVENOUS | Status: DC | PRN

## 2019-07-14 MED ORDER — LOSARTAN POTASSIUM 50 MG PO TABS
100.0000 mg | ORAL_TABLET | Freq: Every day | ORAL | Status: DC
  Filled 2019-07-14: qty 2

## 2019-07-14 SURGICAL SUPPLY — 64 items
ADH SKN CLS APL DERMABOND .7 (GAUZE/BANDAGES/DRESSINGS) ×1
CABLE BIPOLOR RESECTION CORD (MISCELLANEOUS) ×2 IMPLANT
CANISTER SUCT 3000ML PPV (MISCELLANEOUS) ×2 IMPLANT
CHARGING SYSTEM VERCISE (MISCELLANEOUS) ×2
COVER WAND RF STERILE (DRAPES) ×1 IMPLANT
DECANTER SPIKE VIAL GLASS SM (MISCELLANEOUS) ×3 IMPLANT
DERMABOND ADVANCED (GAUZE/BANDAGES/DRESSINGS) ×1
DERMABOND ADVANCED .7 DNX12 (GAUZE/BANDAGES/DRESSINGS) IMPLANT
DRAPE CAMERA VIDEO/LASER (DRAPES) ×2 IMPLANT
DRAPE INCISE IOBAN 66X45 STRL (DRAPES) ×1 IMPLANT
DRAPE ORTHO SPLIT 77X108 STRL (DRAPES) ×2
DRAPE POUCH INSTRU U-SHP 10X18 (DRAPES) ×2 IMPLANT
DRAPE SURG ORHT 6 SPLT 77X108 (DRAPES) ×4 IMPLANT
DRSG OPSITE POSTOP 3X4 (GAUZE/BANDAGES/DRESSINGS) ×1 IMPLANT
DRSG OPSITE POSTOP 4X6 (GAUZE/BANDAGES/DRESSINGS) ×1 IMPLANT
DRSG TEGADERM 4X4.75 (GAUZE/BANDAGES/DRESSINGS) ×1 IMPLANT
DRSG TELFA 3X8 NADH (GAUZE/BANDAGES/DRESSINGS) ×2 IMPLANT
DURAPREP 26ML APPLICATOR (WOUND CARE) ×3 IMPLANT
ELECT CAUTERY BLADE 6.4 (BLADE) ×2 IMPLANT
ELECT REM PT RETURN 9FT ADLT (ELECTROSURGICAL) ×2
ELECTRODE REM PT RTRN 9FT ADLT (ELECTROSURGICAL) ×1 IMPLANT
GAUZE 4X4 16PLY RFD (DISPOSABLE) ×3 IMPLANT
GLOVE BIO SURGEON STRL SZ8 (GLOVE) ×3 IMPLANT
GLOVE BIOGEL PI IND STRL 7.5 (GLOVE) IMPLANT
GLOVE BIOGEL PI IND STRL 8 (GLOVE) ×1 IMPLANT
GLOVE BIOGEL PI IND STRL 8.5 (GLOVE) ×2 IMPLANT
GLOVE BIOGEL PI INDICATOR 7.5 (GLOVE) ×3
GLOVE BIOGEL PI INDICATOR 8 (GLOVE) ×1
GLOVE BIOGEL PI INDICATOR 8.5 (GLOVE) ×1
GLOVE ECLIPSE 8.0 STRL XLNG CF (GLOVE) ×3 IMPLANT
GLOVE SURG SS PI 7.0 STRL IVOR (GLOVE) ×2 IMPLANT
GOWN STRL REUS W/ TWL LRG LVL3 (GOWN DISPOSABLE) ×2 IMPLANT
GOWN STRL REUS W/ TWL XL LVL3 (GOWN DISPOSABLE) IMPLANT
GOWN STRL REUS W/TWL 2XL LVL3 (GOWN DISPOSABLE) ×3 IMPLANT
GOWN STRL REUS W/TWL LRG LVL3 (GOWN DISPOSABLE) ×1
GOWN STRL REUS W/TWL XL LVL3 (GOWN DISPOSABLE) ×2
HEMOSTAT POWDER KIT SURGIFOAM (HEMOSTASIS) ×1 IMPLANT
KIT BASIN OR (CUSTOM PROCEDURE TRAY) ×2 IMPLANT
KIT CONTACT EXTENSION 55CMX8 (Miscellaneous) ×1 IMPLANT
KIT IMPLANT PULSE GENERATOR (Generator) ×1 IMPLANT
KIT REMOTE CONTROL 3 VERCISE (KITS) ×1 IMPLANT
KIT REMOVER STAPLE SKIN (MISCELLANEOUS) ×2 IMPLANT
KIT TURNOVER KIT B (KITS) ×2 IMPLANT
NDL HYPO 25X1 1.5 SAFETY (NEEDLE) ×2 IMPLANT
NEEDLE HYPO 25X1 1.5 SAFETY (NEEDLE) ×2 IMPLANT
NS IRRIG 1000ML POUR BTL (IV SOLUTION) ×2 IMPLANT
PACK LAMINECTOMY NEURO (CUSTOM PROCEDURE TRAY) ×2 IMPLANT
PAD ARMBOARD 7.5X6 YLW CONV (MISCELLANEOUS) ×6 IMPLANT
PAD DRESSING TELFA 3X8 NADH (GAUZE/BANDAGES/DRESSINGS) IMPLANT
PASSER CATH 36 CODMAN DISP (NEUROSURGERY SUPPLIES) ×1 IMPLANT
PENCIL BUTTON HOLSTER BLD 10FT (ELECTRODE) ×1 IMPLANT
SHEATH PERITONEAL INTRO 46 (MISCELLANEOUS) ×1 IMPLANT
STAPLER SKIN PROX WIDE 3.9 (STAPLE) ×3 IMPLANT
SUT ETHILON 3 0 PS 1 (SUTURE) ×2 IMPLANT
SUT SILK 0 (SUTURE) ×2
SUT SILK 0 MO-6 18XCR BRD 8 (SUTURE) IMPLANT
SUT SILK 2 0 TIES 10X30 (SUTURE) ×2 IMPLANT
SUT VIC AB 2-0 CP2 18 (SUTURE) ×3 IMPLANT
SUT VIC AB 3-0 SH 8-18 (SUTURE) ×3 IMPLANT
SYSTEM CHARGING VERCISE (MISCELLANEOUS) IMPLANT
TOWEL GREEN STERILE (TOWEL DISPOSABLE) ×3 IMPLANT
TOWEL GREEN STERILE FF (TOWEL DISPOSABLE) ×3 IMPLANT
UNDERPAD 30X30 (UNDERPADS AND DIAPERS) ×1 IMPLANT
WATER STERILE IRR 1000ML POUR (IV SOLUTION) ×2 IMPLANT

## 2019-07-14 NOTE — Anesthesia Preprocedure Evaluation (Signed)
Anesthesia Evaluation  Patient identified by MRN, date of birth, ID band Patient awake    Reviewed: Allergy & Precautions, NPO status , Patient's Chart, lab work & pertinent test results  Airway Mallampati: II  TM Distance: >3 FB Neck ROM: Full    Dental  (+) Teeth Intact, Dental Advisory Given   Pulmonary former smoker,    breath sounds clear to auscultation       Cardiovascular hypertension,  Rhythm:Regular Rate:Normal     Neuro/Psych    GI/Hepatic   Endo/Other  diabetes  Renal/GU      Musculoskeletal   Abdominal   Peds  Hematology   Anesthesia Other Findings   Reproductive/Obstetrics                             Anesthesia Physical Anesthesia Plan  ASA: III  Anesthesia Plan: General   Post-op Pain Management:    Induction: Intravenous  PONV Risk Score and Plan: Ondansetron and Dexamethasone  Airway Management Planned: Oral ETT  Additional Equipment:   Intra-op Plan:   Post-operative Plan: Extubation in OR  Informed Consent: I have reviewed the patients History and Physical, chart, labs and discussed the procedure including the risks, benefits and alternatives for the proposed anesthesia with the patient or authorized representative who has indicated his/her understanding and acceptance.   Dental advisory given  Plan Discussed with: CRNA and Anesthesiologist  Anesthesia Plan Comments:         Anesthesia Quick Evaluation  

## 2019-07-14 NOTE — Anesthesia Procedure Notes (Signed)
Procedure Name: Intubation Date/Time: 07/14/2019 7:48 AM Performed by: Marsa Aris, CRNA Pre-anesthesia Checklist: Patient identified, Emergency Drugs available, Suction available and Patient being monitored Patient Re-evaluated:Patient Re-evaluated prior to induction Oxygen Delivery Method: Circle System Utilized Preoxygenation: Pre-oxygenation with 100% oxygen Induction Type: IV induction Ventilation: Mask ventilation without difficulty Laryngoscope Size: Miller and 2 Grade View: Grade I Tube type: Oral Tube size: 7.5 mm Number of attempts: 1 Airway Equipment and Method: Stylet and Oral airway Placement Confirmation: ETT inserted through vocal cords under direct vision,  positive ETCO2 and breath sounds checked- equal and bilateral Secured at: 23 cm Tube secured with: Tape Dental Injury: Teeth and Oropharynx as per pre-operative assessment  Comments: No change in dentition from pre-procedure

## 2019-07-14 NOTE — Brief Op Note (Signed)
07/14/2019  9:12 AM  PATIENT:  Joshua [name]  68 y.o. male  PRE-OPERATIVE DIAGNOSIS:  Tremor  POST-OPERATIVE DIAGNOSIS:  Tremor   PROCEDURE:  Procedure(s): Left cranial Implanted Pulse Generator and lead extension placement to right chest  (N/A)  SURGEON:  Surgeon(s) and Role:    Erline Levine, MD - Primary  PHYSICIAN ASSISTANT:   ASSISTANTS: Poteat, RN   ANESTHESIA:   general  EBL:  25 mL   BLOOD ADMINISTERED:none  DRAINS: none   LOCAL MEDICATIONS USED:  MARCAINE    and LIDOCAINE   SPECIMEN:  No Specimen  DISPOSITION OF SPECIMEN:  N/A  COUNTS:  YES  TOURNIQUET:  * No tourniquets in log *  DICTATION: DICTATION: Patient has implanted left VIM thalamic stimulator electrodes having recently completed DBS Stage I and now presents for placement of lead extension and IPG implantation.  PROCEDURE: Patient was brought to the operating room and GETA anesthesia was induced.  Right upper chest, scalp, neck were prepped with betadine scrub and Duraprep.  Area of planned incision was infiltrated with lidocaine.  Scalp incision was made and the lead was exposed. An incision was made in the right upper chest and a pocket was created.  Extension tunnel was made from scalp to pocket.  [employer]   IPG was placed and attached to lead extensions, which in turn were connected to cranial leads and torqued appropriately.   The IPG  was placed in the pocket and anchored with a single silk stitch.  Wounds were irrigated with vancomycin.  Incisions were closed with 2-0 Vicryl and 3-0 vicryl sutures at the pocket and 2-0 vicryl at the scalp with staples. and dressed with a sterile occlusive dressing.  Counts were correct at the end of the case.  PLAN OF CARE: Admit for overnight observation  PATIENT DISPOSITION:  PACU - hemodynamically stable.   Delay start of Pharmacological VTE agent (>24hrs) due to surgical blood loss or risk of bleeding: yes

## 2019-07-14 NOTE — Interval H&P Note (Signed)
History and Physical Interval Note:  07/14/2019 7:20 AM  Joshua [name]  has presented today for surgery, with the diagnosis of Tremor.  The various methods of treatment have been discussed with the patient and family. After consideration of risks, benefits and other options for treatment, the patient has consented to  Procedure(s) with comments: Bilateral implanted pulse generator and lead extension placement (Bilateral) - Bilateral implanted pulse generator and lead extension placement as a surgical intervention.  The patient's history has been reviewed, patient examined, no change in status, stable for surgery.  I have reviewed the patient's chart and labs.  Questions were answered to the patient's satisfaction.     [name]

## 2019-07-14 NOTE — Transfer of Care (Signed)
Immediate Anesthesia Transfer of Care Note  Patient: Dayshaun A Dress  Procedure(s) Performed: Left cranial Implanted Pulse Generator and lead extension placement to right chest  (N/A Head)  Patient Location: PACU  Anesthesia Type:General  Level of Consciousness: awake, alert  and oriented  Airway & Oxygen Therapy: Patient Spontanous Breathing  Post-op Assessment: Report given to RN and Post -op Vital signs reviewed and stable  Post vital signs: Reviewed and stable  Last Vitals:  Vitals Value Taken Time  BP 156/85 07/14/19 0913  Temp    Pulse 89 07/14/19 0913  Resp 14 07/14/19 0913  SpO2 96 % 07/14/19 0913  Vitals shown include unvalidated device data.  Last Pain:  Vitals:   07/14/19 0604  PainSc: 0-No pain      Patients Stated Pain Goal: 2 (53/79/43 2761)  Complications: No apparent anesthesia complications

## 2019-07-14 NOTE — Anesthesia Postprocedure Evaluation (Signed)
Anesthesia Post Note  Patient: Schneider A Bonifas  Procedure(s) Performed: Left cranial Implanted Pulse Generator and lead extension placement to right chest  (N/A Head)     Patient location during evaluation: PACU Anesthesia Type: General Level of consciousness: awake and alert Pain management: pain level controlled Vital Signs Assessment: post-procedure vital signs reviewed and stable Respiratory status: spontaneous breathing, nonlabored ventilation, respiratory function stable and patient connected to nasal cannula oxygen Cardiovascular status: blood pressure returned to baseline and stable Postop Assessment: no apparent nausea or vomiting Anesthetic complications: no    Last Vitals:  Vitals:   07/14/19 0945 07/14/19 1000  BP: 136/87 136/81  Pulse: 91 88  Resp: 13 13  Temp:  36.6 C  SpO2: 91% 93%    Last Pain:  Vitals:   07/14/19 1030  PainSc: 2                  Cathyann Kilfoyle COKER

## 2019-07-14 NOTE — Op Note (Signed)
07/14/2019  9:12 AM  PATIENT:  Joshua [name]  68 y.o. male  PRE-OPERATIVE DIAGNOSIS:  Tremor  POST-OPERATIVE DIAGNOSIS:  Tremor   PROCEDURE:  Procedure(s): Left cranial Implanted Pulse Generator and lead extension placement to right chest  (N/A)  SURGEON:  Surgeon(s) and Role:    Erline Levine, MD - Primary  PHYSICIAN ASSISTANT:   ASSISTANTS: Poteat, RN   ANESTHESIA:   general  EBL:  25 mL   BLOOD ADMINISTERED:none  DRAINS: none   LOCAL MEDICATIONS USED:  MARCAINE    and LIDOCAINE   SPECIMEN:  No Specimen  DISPOSITION OF SPECIMEN:  N/A  COUNTS:  YES  TOURNIQUET:  * No tourniquets in log *  DICTATION: DICTATION: Patient has implanted left VIM thalamic stimulator electrodes having recently completed DBS Stage I and now presents for placement of lead extension and IPG implantation.  PROCEDURE: Patient was brought to the operating room and GETA anesthesia was induced.  Right upper chest, scalp, neck were prepped with betadine scrub and Duraprep.  Area of planned incision was infiltrated with lidocaine.  Scalp incision was made and the lead was exposed. An incision was made in the right upper chest and a pocket was created.  Extension tunnel was made from scalp to pocket.  [employer]   IPG was placed and attached to lead extensions, which in turn were connected to cranial leads and torqued appropriately.   The IPG  was placed in the pocket and anchored with a single silk stitch.  Wounds were irrigated with vancomycin.  Incisions were closed with 2-0 Vicryl and 3-0 vicryl sutures at the pocket and 2-0 vicryl at the scalp with staples. and dressed with a sterile occlusive dressing.  Counts were correct at the end of the case.  PLAN OF CARE: Admit for overnight observation  PATIENT DISPOSITION:  PACU - hemodynamically stable.   Delay start of Pharmacological VTE agent (>24hrs) due to surgical blood loss or risk of bleeding: yes

## 2019-07-14 NOTE — Progress Notes (Signed)
Pt doing well. Pt and wife given D/C instructions with verbal understanding. Pt's incisions are clean and dry with no sign of infection. Pt's IV was removed prior to D/C. Pt D/C'd home via wheelchair per MD order. Pt is stable @ D/C and has no other needs at this time. Holli Humbles, RN

## 2019-07-14 NOTE — Discharge Summary (Signed)
Physician Discharge Summary  Patient ID: Joshua [name] MRN: [id number] DOB/AGE: Jan 29, 1951 68 y.o.  Admit date: 07/14/2019 Discharge date: 07/14/2019  Admission Diagnoses:Tremor    Discharge Diagnoses: Tremor s/p Left cranial Implanted Pulse Generator and lead extension placement to right chest (N/A)     Active Problems:   Essential tremor   Tremor   Discharged Condition: good  Hospital Course: Joshua [name] returned for placement of IPG and lead extensions. Following uncomplicated surgery, he has recovered nicely and is mobilizing well. Incisions without erythema, swelling and drainage.   Consults: None  Significant Diagnostic Studies:   Treatments: surgery: Left cranial Implanted Pulse Generator and lead extension placement to right chest (N/A)    Discharge Exam: Blood pressure 136/81, pulse 88, temperature 97.8 F (36.6 C), resp. rate 13, SpO2 93 %. Alert, conversant, reporting no pain. Incisions without erythema, swelling or drainage. Mobilizing well.   Disposition: Discharge to home. Office f/u in 2 weeks for staple removal. He will see Dr. Carles Collet to activate deep brain stimulator next month.   Allergies as of 07/14/2019   No Known Allergies   Discharge Instructions    Diet - low sodium heart healthy   Complete by: As directed    Increase activity slowly   Complete by: As directed      Allergies as of 07/14/2019   No Known Allergies     Medication List    TAKE these medications   amLODipine 5 MG tablet Commonly known as: NORVASC Take 5 mg by mouth every evening.   aspirin 81 MG tablet Take 1 tablet (81 mg total) by mouth daily.   atorvastatin 40 MG tablet Commonly known as: LIPITOR Take 40 mg by mouth every evening.   cephALEXin 500 MG capsule Commonly known as: Keflex Take 1 capsule (500 mg total) by mouth 3 (three) times daily.   DULoxetine 60 MG capsule Commonly known as: CYMBALTA Take 60 mg by mouth daily.    fenofibrate 54 MG tablet Take 54 mg by mouth daily.   Fish Oil 1000 MG Caps Take 2,000 mg by mouth daily. Reported on 02/13/2016   HumaLOG KwikPen 100 UNIT/ML KwikPen Generic drug: insulin lispro Inject 25 Units into the skin 3 (three) times daily with meals.   hydrochlorothiazide 25 MG tablet Commonly known as: HYDRODIURIL Take 25 mg by mouth daily.   HYDROcodone-acetaminophen 5-325 MG tablet Commonly known as: NORCO/VICODIN Take 1-2 tablets by mouth every 4 (four) hours as needed for severe pain ((score 7 to 10)).   losartan 100 MG tablet Commonly known as: COZAAR Take 100 mg by mouth daily.   Lutein-Zeaxanthin 25-5 MG Caps Take 1 tablet by mouth daily.   metFORMIN 1000 MG tablet Commonly known as: GLUCOPHAGE Take 1,000 mg by mouth 2 (two) times daily with a meal.   multivitamin with minerals Tabs tablet Take 1 tablet by mouth daily.   mupirocin ointment 2 % Commonly known as: BACTROBAN Apply 1 application topically 2 (two) times daily.   OneTouch Verio test strip Generic drug: glucose blood   SYSTANE OP Place 1-2 drops into both eyes 4 (four) times daily as needed (dry eyes).   Toujeo SoloStar 300 UNIT/ML Sopn Generic drug: Insulin Glargine (1 Unit Dial) Inject 80 Units into the skin 2 (two) times daily. Breakfast and bedtime        Signed: [name], MD 07/14/2019, 3:05 PM

## 2019-07-15 ENCOUNTER — Encounter (HOSPITAL_COMMUNITY): Payer: Self-pay | Admitting: Neurosurgery

## 2019-07-28 DIAGNOSIS — Z978 Presence of other specified devices: Secondary | ICD-10-CM | POA: Insufficient documentation

## 2019-08-05 NOTE — Progress Notes (Signed)
Subjective:   Joshua [name] was seen in consultation in the movement disorder clinic at the request of [name], MD.  His PCP is [name], MD.  This patient is accompanied in the office by his wife who supplements the history.  The evaluation is for possible DBS for tremor.  The records that were made available to me were reviewed.  Tremor started approximately27 years ago.  It has gotten worse over the last 4 years  and involves the bilateral UE.  The R hand is worse than the L.  The mornings are worse than later in the day.    Tremor is most noticeable when he is going to hold something and when trying to approximate an object.   There is a family hx of tremor in his mother/brother/maternal uncle.  Pt started on primidone in about 2014, took it for about a year and was not sure that it was helpful.  He restarted it in Jan. 2018 at 25 mg bid.  He has tried weighted pens, which were somewhat helpful, but weighted utensils were not.  At the end of July, 2018, his primidone was increased to 50 mg bid.  He expressed interest in getting information on DBS therapy.  Affected by caffeine:  No. (drinks 1 cup coffee per day; drinks 1-2 large glasses tea per day) Affected by alcohol:  No. (none since May; prior 1-2 per week) Affected by stress:  No. Affected by fatigue:  No. Spills soup if on spoon:  Yes.   Spills glass of liquid if full:  Yes.   Affects ADL's (tying shoes, brushing teeth, etc):  Yes.   (shaves with blade with 2 hands)  Current/Previously tried tremor medications: primidone  Current medications that may exacerbate tremor:  n/a  Outside reports reviewed: historical medical records, lab reports and referral letter/letters.  The patient did have a left total knee replacement on May 7.  About a week later he ended up back in the hospital with DVT/PE.  He is on Lovenox for that.  12/28/17 update: Patient is seen back in follow-up, as consideration for possible DBS therapy for essential  tremor.  We held off on further exploration last visit because he had a DVT/PE in May and was on Lovenox.  I called PCP office and got notes from last office visit.  he is now on eliquis.  Patient reports today that the plan was changed and he needed to be on the eliquis for a year (until May).  He is still on primidone, 50 mg twice per day.  He has an appointment later this week with Dr. Jannifer Franklin at [facility] neurology but thinks he has to cancel that.  It was originally supposed to be last week but [facility] changed the appt and now he cant make it b/c he is working.  09/09/18 update: Patient is seen today in follow-up for tremor.  Records are reviewed since last visit.  He is now off Eliquis.  He is back on primidone, 50 mg bid.  He does have neurocognitive testing with Dr. Si Raider.  There was no evidence of psychiatric diagnoses or dementia.  Patient desires to proceed with DBS.  "tremor is the one thing that I cannot adjust to."  He cannot button clothes.  He has trouble eating soup.  He cannot eat with a fork.  Both hands shake equally but he is R handed.  He has been seeing podiatry for a foot ulcer.  He is considering surgery  11/29/18 update: Patient is seen today in follow-up for tremor and for preop DBS video.  He is accompanied by his wife who supplements the history.  He has completed his preop MRI.  I have reviewed these images.  The patient was supposed to have an appointment with Dr. Vertell Limber, but there was some confusion and he missed the appointment.  He states that he called to r/s but couldn't get an appt until June.  The patient is still on primidone 50 mg bid.  In regards to his leg wound, he had left foot fourth metatarsal condylectomy on October 13, 2018.  I have reviewed those records.  He last followed up on November 08, 2018.  He is back in a regular shoe.  Records indicate no evidence of infection.  Pt states that "it is great now."  01/27/19 update: Patient is seen today in follow-up for tremor.   Patient's insurance company has not approved his surgery until the Gretel Acre tremor rating scale was completed.  He comes in today for that can be completed.  He also comes in today for last-minute questions before surgery.   08/08/19 update: Patient seen today in follow-up for essential tremor.  Patient underwent left VIM DBS on July 07, 2019.  He had IPG placed on July 14, 2019.  The intention was originally to do bilateral VIM DBS, but the patient had a vagal episode with nausea and vomiting, and he lost CSF, and it was decided not to attempt the other side.  Patient has already followed back up with Dr. Vertell Limber.  Records have been reviewed.  He is off of his primidone.  No Known Allergies  Outpatient Encounter Medications as of 08/08/2019  Medication Sig   amLODipine (NORVASC) 5 MG tablet Take 5 mg by mouth every evening.    aspirin 81 MG tablet Take 1 tablet (81 mg total) by mouth daily.   atorvastatin (LIPITOR) 40 MG tablet Take 40 mg by mouth every evening.    cephALEXin (KEFLEX) 500 MG capsule Take 1 capsule (500 mg total) by mouth 3 (three) times daily.   DULoxetine (CYMBALTA) 60 MG capsule Take 60 mg by mouth daily.   fenofibrate 54 MG tablet Take 54 mg by mouth daily.   HUMALOG KWIKPEN 100 UNIT/ML KwikPen Inject 25 Units into the skin 3 (three) times daily with meals.    hydrochlorothiazide (HYDRODIURIL) 25 MG tablet Take 25 mg by mouth daily.   HYDROcodone-acetaminophen (NORCO/VICODIN) 5-325 MG tablet Take 1-2 tablets by mouth every 4 (four) hours as needed for severe pain ((score 7 to 10)).   losartan (COZAAR) 100 MG tablet Take 100 mg by mouth daily.   Lutein-Zeaxanthin 25-5 MG CAPS Take 1 tablet by mouth daily.   metFORMIN (GLUCOPHAGE) 1000 MG tablet Take 1,000 mg by mouth 2 (two) times daily with a meal.   Multiple Vitamin (MULTIVITAMIN WITH MINERALS) TABS tablet Take 1 tablet by mouth daily.   mupirocin ointment (BACTROBAN) 2 % Apply 1 application  topically 2 (two) times daily.   Omega-3 Fatty Acids (FISH OIL) 1000 MG CAPS Take 2,000 mg by mouth daily. Reported on 02/13/2016   ONETOUCH VERIO test strip    Polyethyl Glycol-Propyl Glycol (SYSTANE OP) Place 1-2 drops into both eyes 4 (four) times daily as needed (dry eyes).    TOUJEO SOLOSTAR 300 UNIT/ML SOPN Inject 80 Units into the skin 2 (two) times daily. Breakfast and bedtime   No facility-administered encounter medications on file as of 08/08/2019.  Past Medical History:  Diagnosis Date   Benign essential tremor    Benign positional vertigo    CVA (cerebral vascular accident) ([place])    x2 - L retina, 1 right parietal   Degenerative arthritis    Depression    Diabetes mellitus    Dyslipidemia    GERD (gastroesophageal reflux disease)    hiatal hernia   Gout    H/O: vasectomy    Hearing aid worn    b/l   Hx of appendectomy    Hx of tonsillectomy    Hypertension    Ischemic optic neuropathy    on the left   Melanoma ([place])    Obesity    OSA on CPAP    setting = 5   Tremor, essential 06/22/2017   Wears glasses     Past Surgical History:  Procedure Laterality Date   APPENDECTOMY     arthroscopic knee surgery Bilateral    CATARACT EXTRACTION Bilateral    COLONOSCOPY     MINOR PLACEMENT OF FIDUCIAL N/A 06/30/2019   Procedure: Fiducial placement;  Surgeon: Erline Levine, MD;  Location: [place];  Service: Neurosurgery;  Laterality: N/A;  Fiducial placement   NASAL SEPTUM SURGERY     PULSE GENERATOR IMPLANT N/A 07/14/2019   Procedure: Left cranial Implanted Pulse Generator and lead extension placement to right chest ;  Surgeon: Erline Levine, MD;  Location: [place];  Service: Neurosurgery;  Laterality: N/A;   SUBTHALAMIC STIMULATOR INSERTION Left 07/07/2019   Procedure: LEFT DEEP BRAIN STIMULATOR PLACEMENT;  Surgeon: Erline Levine, MD;  Location: [place];  Service: Neurosurgery;  Laterality: Left;   TONSILLECTOMY     TOTAL KNEE ARTHROPLASTY  Left 03/30/2017   Procedure: LEFT TOTAL KNEE ARTHROPLASTY;  Surgeon: Paralee Cancel, MD;  Location: [facility];  Service: Orthopedics;  Laterality: Left;   WISDOM TOOTH EXTRACTION      Social History   Socioeconomic History   Marital status: Married    Spouse name: [name]   Number of children: 2   Years of education: Bachelors    Highest education level: Bachelor's degree (e.g., BA, AB, BS)  Occupational History   Occupation: retired    Fish farm manager: [employer]    Comment: Loss adjuster, chartered strain: Not on file   Food insecurity    Worry: Not on file    Inability: Not on file   Transportation needs    Medical: Not on file    Non-medical: Not on file  Tobacco Use   Smoking status: Former Smoker    Packs/day: 1.00    Years: 10.00    Pack years: 10.00    Types: Cigarettes    Quit date: 11/25/1983    Years since quitting: 35.7   Smokeless tobacco: Never Used  Substance and Sexual Activity   Alcohol use: Yes    Alcohol/week: 2.0 standard drinks    Types: 2 Standard drinks or equivalent per week    Comment: occassionally   Drug use: No   Sexual activity: Not on file  Lifestyle   Physical activity    Days per week: Not on file    Minutes per session: Not on file   Stress: Not on file  Relationships   Social connections    Talks on phone: Not on file    Gets together: Not on file    Attends religious service: Not on file    Active member of club or organization: Not on file  Attends meetings of clubs or organizations: Not on file    Relationship status: Not on file   Intimate partner violence    Fear of current or ex partner: Not on file    Emotionally abused: Not on file    Physically abused: Not on file    Forced sexual activity: Not on file  Other Topics Concern   Not on file  Social History Narrative   Patient lives at home with wife. [name](   Patient has 2 children that are in good health.    Patient  works for [employer]. Retired .   Patient has a Bachelors degree in History.     Family Status  Relation Name Status   Mother  Deceased at age 58       Cerebral aneurysm   Father  Deceased at age 34       Alzheimer's disease   Brother  Alive   Administrator  (Not Specified)   MGM  (Not Specified)   Son 2 Alive   Neg Hx  (Not Specified)    Review of Systems Review of Systems  Constitutional: Positive for malaise/fatigue (slowly getting better since sx).  Eyes: Negative.   Respiratory: Negative.   Cardiovascular: Negative.   Gastrointestinal: Negative.   Genitourinary: Negative.   Skin: Negative.      Objective:   VITALS:   Vitals:   08/08/19 1421  BP: 130/81  Pulse: 94  SpO2: 94%  Weight: 264 lb 12.8 oz (120.1 kg)  Height: 6\' 4"  (1.93 m)   GEN:  The patient appears stated age and is in NAD. HEENT:  Normocephalic, atraumatic.  The mucous membranes are moist. The superficial temporal arteries are without ropiness or tenderness. CV:  RRR Lungs:  CTAB Neck/HEME:  There are no carotid bruits bilaterally. Skin: Incision sites at the scalp as well as the right chest are well-healed.  Neurological examination:  Orientation: The patient is alert and oriented x3. Cranial nerves: There is good facial symmetry. The speech is fluent and clear. Soft palate rises symmetrically and there is no tongue deviation. Hearing is intact to conversational tone. Sensation: Sensation is intact to light touch throughout Motor: Strength is 5/5 in the bilateral upper and lower extremities.   Shoulder shrug is equal and symmetric.  There is no pronator drift.  MOVEMENT EXAM: Tremor: some rest tremor in the R hand today.  Some vocal tremor.    Prior to programming, he has at least moderate postural tremor on the right and mild to moderate on the left.      Chemistry      Component Value Date/Time   NA 139 07/07/2019 0559   K 3.9 07/07/2019 0559   CL 104 07/07/2019 0559    CO2 23 07/07/2019 0559   BUN 24 (H) 07/07/2019 0559   CREATININE 1.78 (H) 07/07/2019 0559      Component Value Date/Time   CALCIUM 9.1 07/07/2019 0559   ALKPHOS 78 02/02/2014 1108   AST 33 02/02/2014 1108   ALT 26 02/02/2014 1108   BILITOT 0.3 02/02/2014 1108     Lab Results  Component Value Date   WBC 11.6 (H) 07/07/2019   HGB 13.5 07/07/2019   HCT 41.3 07/07/2019   MCV 91.0 07/07/2019   PLT 299 07/07/2019   DBS programming was performed today, which is described in more detail on a separate programming procedure note.     Assessment/Plan:   1.  Essential Tremor, overall moderate  -  Patient underwent left VIM DBS on July 07, 2019.  The intention was originally to do bilateral VIM DBS, but the patient had a vagal episode in the operating room with associated nausea and vomiting.  He lost CSF during the episode, and it was decided not to attempt the other side at that point in time.  Patient expresses desire to do the right VIM surgery  -Patient had his IPG placed on July 14, 2019.    -Device was activated today and described in more detail on a separate programming procedure note.  -Patient shown how to use charger as well as patient remote.   2.  Peripheral neuropathy  -The patient has clinical examination evidence of a diffuse peripheral neuropathy, which certainly can affect gait and balance.  We discussed safety associated with peripheral neuropathy.  His is likely due to DM.  We discussed the importance of controlling the diabetes, especially if we are to consider deep brain stimulation surgery.  3.   Right metatarsal head ulceration  -Patient has seen podiatry.  Notes are reviewed.  Patient would like to have that surgery done.  I told him to go ahead and get the surgery completed, as it will likely take quite some time before we can get him back into the OR to do the right VIM surgery.    CC:  [name], MD

## 2019-08-08 ENCOUNTER — Other Ambulatory Visit: Payer: Self-pay

## 2019-08-08 ENCOUNTER — Encounter: Payer: Self-pay | Admitting: Neurology

## 2019-08-08 ENCOUNTER — Ambulatory Visit (INDEPENDENT_AMBULATORY_CARE_PROVIDER_SITE_OTHER): Payer: Medicare Other | Admitting: Neurology

## 2019-08-08 VITALS — BP 130/81 | HR 94 | Ht 76.0 in | Wt 264.8 lb

## 2019-08-08 DIAGNOSIS — Z9689 Presence of other specified functional implants: Secondary | ICD-10-CM | POA: Diagnosis not present

## 2019-08-08 DIAGNOSIS — G250 Essential tremor: Secondary | ICD-10-CM | POA: Diagnosis not present

## 2019-08-08 DIAGNOSIS — L84 Corns and callosities: Secondary | ICD-10-CM | POA: Diagnosis not present

## 2019-08-08 NOTE — Procedures (Signed)
DBS Programming was performed.    Manufacturer of DBS device: [place]  Total time spent programming was 55 minutes.  Device was initially off, as this was a new start.  Device was turned on.  Complete monopolar review was done.    Soft start was confirmed to be on.  Impedences were checked and were within normal limits.  Battery was checked and was determined to be functioning normally and not near the end of life.  Final settings were as follows:  Left brain electrode:     5-C+           ; Amplitude  2.0   V   ; Pulse width 60 microseconds;   Frequency   130   Hz.  Right brain electrode:    Not applicable  Monopolar review: Left brain electrode:     1-C+           ; Amplitude  1.0   V   ; Pulse width 60 microseconds;   Frequency   130   Hz.  (Tongue paresthesias) Left brain electrode:     (2-3-4-) 33% each C+           ; Amplitude  2.0   V   ; Pulse width 60 microseconds;   Frequency   130   Hz.  (Tongue and lip paresthesias, fairly good tremor control) Left brain electrode:     (5-6-7-) 33% C+           ; Amplitude  2.0   V   ; Pulse width 60 microseconds;   Frequency   130   Hz.  (Tongue paresthesias but no lip paresthesia and good tremor control) Left brain electrode:     8-C+           ; Amplitude  1.0   V   ; Pulse width 60 microseconds;   Frequency   130   Hz.  (Paresthesias resolved)

## 2019-08-12 ENCOUNTER — Ambulatory Visit: Payer: Medicare Other | Admitting: Podiatry

## 2019-08-12 ENCOUNTER — Other Ambulatory Visit: Payer: Self-pay

## 2019-08-12 ENCOUNTER — Encounter: Payer: Self-pay | Admitting: Podiatry

## 2019-08-12 ENCOUNTER — Telehealth: Payer: Self-pay | Admitting: *Deleted

## 2019-08-12 DIAGNOSIS — L97529 Non-pressure chronic ulcer of other part of left foot with unspecified severity: Secondary | ICD-10-CM

## 2019-08-12 DIAGNOSIS — L97519 Non-pressure chronic ulcer of other part of right foot with unspecified severity: Secondary | ICD-10-CM

## 2019-08-12 DIAGNOSIS — E11621 Type 2 diabetes mellitus with foot ulcer: Secondary | ICD-10-CM | POA: Diagnosis not present

## 2019-08-12 DIAGNOSIS — M216X9 Other acquired deformities of unspecified foot: Secondary | ICD-10-CM | POA: Diagnosis not present

## 2019-08-12 MED ORDER — CEPHALEXIN 500 MG PO CAPS: 500.0000 mg | ORAL_CAPSULE | Freq: Three times a day (TID) | ORAL | 0 refills | Status: DC

## 2019-08-12 NOTE — Progress Notes (Signed)
Subjective: 68 year old male presents the office today for follow evaluation of a painful callus on the right foot.  He states he tried trimming himself and thinks he went to close.  Area is painful and he wants to proceed with surgery at this time.  He states that he has also discussed this with Dr. Carles Collet and Dr. Vertell Limber. Denies any systemic concerns such as fevers, chills, nausea, vomiting. No acute changes since last appointment, and no other complaints at this time.   Objective: AAO x3, NAD DP/PT pulses palpable bilaterally, CRT less than 3 seconds On the right foot submetatarsal 5 there is a thick hyperkeratotic lesion with dried blood.  Upon debridement there is a superficial area of skin breakdown but there is no probing, undermining or tunneling.  Mild surrounding erythema and edema but no ascending cellulitis.  No fluctuation crepitation.  No open lesions or pre-ulcerative lesions.  No pain with calf compression, swelling, warmth, erythema  Assessment: Recurrent ulceration right foot submetatarsal 5  Plan: -All treatment options discussed with the patient including all alternatives, risks, complications.  -Sharply debrided the hyperkeratotic tissue today to reveal the underlying ulceration. Debrided the wound utilizing the 312 with scalpel any complications down to healthy tissue. Recommended antibiotic ointment dressing changes daily.  Prescribed Keflex. -We can discuss both conservative as well as surgical treatment options.  At this time he wishes to proceed with surgical intervention. -Discussed with him fifth metatarsal head resection -The incision placement as well as the postoperative course was discussed with the patient. I discussed risks of the surgery which include, but not limited to, infection, bleeding, pain, swelling, need for further surgery, delayed or nonhealing, painful or ugly scar, numbness or sensation changes, over/under correction, recurrence, transfer lesions, further  deformity,  DVT/PE, loss of toe/foot. Patient understands these risks and wishes to proceed with surgery. The surgical consent was reviewed with the patient all 3 pages were signed. No promises or guarantees were given to the outcome of the procedure. All questions were answered to the best of my ability. Before the surgery the patient was encouraged to call the office if there is any further questions. The surgery will be performed at the [facility] on an outpatient basis. -Patient encouraged to call the office with any questions, concerns, change in symptoms.   Trula Slade DPM

## 2019-08-12 NOTE — Telephone Encounter (Signed)
"  I just saw Dr. Jacqualyn Posey about a surgery on my right foot.  I've got everything.  I've got the date, it's scheduled for September 30.  I do not have a time though.  It wasn't on anything.  I know I'm supposed to be there a hour before my surgery time but I was not given a time.  So if you could, give me a call."  I'm returning your call.  Someone from the surgical center will give you a call a day or two prior to your surgery date and will give you your arrival time.  "Oh, so they will give me a call from the surgery center?"  Yes, that is correct.  "Sorry to have bothered you."  You were no bother at all.

## 2019-08-12 NOTE — Patient Instructions (Signed)

## 2019-08-18 ENCOUNTER — Telehealth: Payer: Self-pay | Admitting: *Deleted

## 2019-08-18 NOTE — Progress Notes (Signed)
Subjective:   Joshua [name] was seen in consultation in the movement disorder clinic at the request of [name], MD.  His PCP is [name], MD.  This patient is accompanied in the office by his wife who supplements the history.  The evaluation is for possible DBS for tremor.  The records that were made available to me were reviewed.  Tremor started approximately27 years ago.  It has gotten worse over the last 4 years  and involves the bilateral UE.  The R hand is worse than the L.  The mornings are worse than later in the day.    Tremor is most noticeable when he is going to hold something and when trying to approximate an object.   There is a family hx of tremor in his mother/brother/maternal uncle.  Pt started on primidone in about 2014, took it for about a year and was not sure that it was helpful.  He restarted it in Jan. 2018 at 25 mg bid.  He has tried weighted pens, which were somewhat helpful, but weighted utensils were not.  At the end of July, 2018, his primidone was increased to 50 mg bid.  He expressed interest in getting information on DBS therapy.  Affected by caffeine:  No. (drinks 1 cup coffee per day; drinks 1-2 large glasses tea per day) Affected by alcohol:  No. (none since May; prior 1-2 per week) Affected by stress:  No. Affected by fatigue:  No. Spills soup if on spoon:  Yes.   Spills glass of liquid if full:  Yes.   Affects ADL's (tying shoes, brushing teeth, etc):  Yes.   (shaves with blade with 2 hands)  Current/Previously tried tremor medications: primidone  Current medications that may exacerbate tremor:  n/a  Outside reports reviewed: historical medical records, lab reports and referral letter/letters.  The patient did have a left total knee replacement on May 7.  About a week later he ended up back in the hospital with DVT/PE.  He is on Lovenox for that.  12/28/17 update: Patient is seen back in follow-up, as consideration for possible DBS therapy for essential  tremor.  We held off on further exploration last visit because he had a DVT/PE in May and was on Lovenox.  I called PCP office and got notes from last office visit.  he is now on eliquis.  Patient reports today that the plan was changed and he needed to be on the eliquis for a year (until May).  He is still on primidone, 50 mg twice per day.  He has an appointment later this week with Dr. Jannifer Franklin at [facility] neurology but thinks he has to cancel that.  It was originally supposed to be last week but [facility] changed the appt and now he cant make it b/c he is working.  09/09/18 update: Patient is seen today in follow-up for tremor.  Records are reviewed since last visit.  He is now off Eliquis.  He is back on primidone, 50 mg bid.  He does have neurocognitive testing with Dr. Si Raider.  There was no evidence of psychiatric diagnoses or dementia.  Patient desires to proceed with DBS.  "tremor is the one thing that I cannot adjust to."  He cannot button clothes.  He has trouble eating soup.  He cannot eat with a fork.  Both hands shake equally but he is R handed.  He has been seeing podiatry for a foot ulcer.  He is considering surgery  11/29/18 update: Patient is seen today in follow-up for tremor and for preop DBS video.  He is accompanied by his wife who supplements the history.  He has completed his preop MRI.  I have reviewed these images.  The patient was supposed to have an appointment with Dr. Vertell Limber, but there was some confusion and he missed the appointment.  He states that he called to r/s but couldn't get an appt until June.  The patient is still on primidone 50 mg bid.  In regards to his leg wound, he had left foot fourth metatarsal condylectomy on October 13, 2018.  I have reviewed those records.  He last followed up on November 08, 2018.  He is back in a regular shoe.  Records indicate no evidence of infection.  Pt states that "it is great now."  01/27/19 update: Patient is seen today in follow-up for tremor.   Patient's insurance company has not approved his surgery until the Gretel Acre tremor rating scale was completed.  He comes in today for that can be completed.  He also comes in today for last-minute questions before surgery.   08/08/19 update: Patient seen today in follow-up for essential tremor.  Patient underwent left VIM DBS on July 07, 2019.  He had IPG placed on July 14, 2019.  The intention was originally to do bilateral VIM DBS, but the patient had a vagal episode with nausea and vomiting, and he lost CSF, and it was decided not to attempt the other side.  Patient has already followed back up with Dr. Vertell Limber.  Records have been reviewed.  He is off of his primidone.  08/22/19 update: Patient seen today in follow-up for essential tremor.  Patient's device was activated last visit, 2 weeks ago.  He reports that he is doing much better in regards to tremor.  He has noticed a few times that he still has some tremor, especially if he is doing something like brushing his teeth.  He was able to do something with fine motor movements, like turning in a screw, which she was not able to do previously.  In regards to lip paresthesias, he reports that has resolved.  Records from Dr. Vertell Limber have been reviewed since our last visit.  Records from podiatry are also scheduled.  He is scheduled to have surgery on his foot for his painful callus on September 30.  He has decided not to pursue surgery on the right VIM.  No Known Allergies  Outpatient Encounter Medications as of 08/22/2019  Medication Sig   amLODipine (NORVASC) 5 MG tablet Take 5 mg by mouth every evening.    aspirin 81 MG tablet Take 1 tablet (81 mg total) by mouth daily.   atorvastatin (LIPITOR) 40 MG tablet Take 40 mg by mouth every evening.    cephALEXin (KEFLEX) 500 MG capsule Take 1 capsule (500 mg total) by mouth 3 (three) times daily.   cephALEXin (KEFLEX) 500 MG capsule Take 1 capsule (500 mg total) by mouth 3 (three) times  daily.   DULoxetine (CYMBALTA) 60 MG capsule Take 60 mg by mouth daily.   fenofibrate 54 MG tablet Take 54 mg by mouth daily.   HUMALOG KWIKPEN 100 UNIT/ML KwikPen Inject 25 Units into the skin 3 (three) times daily with meals.    hydrochlorothiazide (HYDRODIURIL) 25 MG tablet Take 25 mg by mouth daily.   HYDROcodone-acetaminophen (NORCO/VICODIN) 5-325 MG tablet Take 1-2 tablets by mouth every 4 (four) hours as needed for severe pain ((score  7 to 10)).   HYDROcodone-acetaminophen (NORCO/VICODIN) 5-325 MG tablet Take 1 tablet by mouth every 4 (four) hours as needed.   losartan (COZAAR) 100 MG tablet Take 100 mg by mouth daily.   Lutein-Zeaxanthin 25-5 MG CAPS Take 1 tablet by mouth daily.   metFORMIN (GLUCOPHAGE) 1000 MG tablet Take 1,000 mg by mouth 2 (two) times daily with a meal.   Multiple Vitamin (MULTIVITAMIN WITH MINERALS) TABS tablet Take 1 tablet by mouth daily.   mupirocin ointment (BACTROBAN) 2 % Apply 1 application topically 2 (two) times daily.   Omega-3 Fatty Acids (FISH OIL) 1000 MG CAPS Take 2,000 mg by mouth daily. Reported on 02/13/2016   ONETOUCH VERIO test strip    Polyethyl Glycol-Propyl Glycol (SYSTANE OP) Place 1-2 drops into both eyes 4 (four) times daily as needed (dry eyes).    promethazine (PHENERGAN) 25 MG tablet Take 1 tablet (25 mg total) by mouth every 8 (eight) hours as needed for nausea or vomiting.   TOUJEO SOLOSTAR 300 UNIT/ML SOPN Inject 80 Units into the skin 2 (two) times daily. Breakfast and bedtime   [DISCONTINUED] cephALEXin (KEFLEX) 500 MG capsule Take 1 capsule (500 mg total) by mouth 3 (three) times daily.   No facility-administered encounter medications on file as of 08/22/2019.     Past Medical History:  Diagnosis Date   Benign essential tremor    Benign positional vertigo    CVA (cerebral vascular accident) ([place])    x2 - L retina, 1 right parietal   Degenerative arthritis    Depression    Diabetes mellitus     Dyslipidemia    GERD (gastroesophageal reflux disease)    hiatal hernia   Gout    H/O: vasectomy    Hearing aid worn    b/l   Hx of appendectomy    Hx of tonsillectomy    Hypertension    Ischemic optic neuropathy    on the left   Melanoma ([place])    Obesity    OSA on CPAP    setting = 5   Tremor, essential 06/22/2017   Wears glasses     Past Surgical History:  Procedure Laterality Date   APPENDECTOMY     arthroscopic knee surgery Bilateral    CATARACT EXTRACTION Bilateral    COLONOSCOPY     MINOR PLACEMENT OF FIDUCIAL N/A 06/30/2019   Procedure: Fiducial placement;  Surgeon: Erline Levine, MD;  Location: [facility];  Service: Neurosurgery;  Laterality: N/A;  Fiducial placement   NASAL SEPTUM SURGERY     PULSE GENERATOR IMPLANT N/A 07/14/2019   Procedure: Left cranial Implanted Pulse Generator and lead extension placement to right chest ;  Surgeon: Erline Levine, MD;  Location: [facility];  Service: Neurosurgery;  Laterality: N/A;   SUBTHALAMIC STIMULATOR INSERTION Left 07/07/2019   Procedure: LEFT DEEP BRAIN STIMULATOR PLACEMENT;  Surgeon: Erline Levine, MD;  Location: [facility];  Service: Neurosurgery;  Laterality: Left;   TONSILLECTOMY     TOTAL KNEE ARTHROPLASTY Left 03/30/2017   Procedure: LEFT TOTAL KNEE ARTHROPLASTY;  Surgeon: Paralee Cancel, MD;  Location: [facility];  Service: Orthopedics;  Laterality: Left;   WISDOM TOOTH EXTRACTION      Social History   Socioeconomic History   Marital status: Married    Spouse name: [name]   Number of children: 2   Years of education: Bachelors    Highest education level: Bachelor's degree (e.g., BA, AB, BS)  Occupational History   Occupation: retired    Fish farm manager: [employer]    Comment: teaching/coaching  Social Designer, fashion/clothing strain: Not on file   Food insecurity    Worry: Not on file    Inability: Not on file   Transportation needs    Medical: Not on file    Non-medical: Not on file    Tobacco Use   Smoking status: Former Smoker    Packs/day: 1.00    Years: 10.00    Pack years: 10.00    Types: Cigarettes    Quit date: 11/25/1983    Years since quitting: 35.7   Smokeless tobacco: Never Used  Substance and Sexual Activity   Alcohol use: Yes    Alcohol/week: 2.0 standard drinks    Types: 2 Standard drinks or equivalent per week    Comment: occassionally   Drug use: No   Sexual activity: Not on file  Lifestyle   Physical activity    Days per week: Not on file    Minutes per session: Not on file   Stress: Not on file  Relationships   Social connections    Talks on phone: Not on file    Gets together: Not on file    Attends religious service: Not on file    Active member of club or organization: Not on file    Attends meetings of clubs or organizations: Not on file    Relationship status: Not on file   Intimate partner violence    Fear of current or ex partner: Not on file    Emotionally abused: Not on file    Physically abused: Not on file    Forced sexual activity: Not on file  Other Topics Concern   Not on file  Social History Narrative   Patient lives at home with wife. [name](   Patient has 2 children that are in good health.    Patient works for [employer]. Retired .   Patient has a Bachelors degree in History.     Family Status  Relation Name Status   Mother  Deceased at age 48       Cerebral aneurysm   Father  Deceased at age 6       Alzheimer's disease   Brother  Alive   Administrator  (Not Specified)   MGM  (Not Specified)   Son 2 Alive   Neg Hx  (Not Specified)    Review of Systems Review of Systems  Constitutional: Negative.   HENT: Negative.   Eyes: Negative.   Respiratory: Negative.   Cardiovascular: Negative.   Gastrointestinal: Negative.   Skin: Negative.      Objective:   VITALS:   Vitals:   08/22/19 1419  BP: (!) 162/82  Pulse: 86  Weight: 266 lb 3.2 oz (120.7 kg)   GEN:  The patient  appears stated age and is in NAD. HEENT:  Normocephalic, atraumatic.  The mucous membranes are moist. The superficial temporal arteries are without ropiness or tenderness. CV:  RRR Lungs:  CTAB Neck/HEME:  There are no carotid bruits bilaterally. Skin: Incision sites at the scalp as well as the right chest are well-healed.  Neurological examination:  Orientation: The patient is alert and oriented x3. Cranial nerves: There is good facial symmetry. The speech is fluent and clear. Soft palate rises symmetrically and there is no tongue deviation. Hearing is intact to conversational tone. Sensation: Sensation is intact to light touch throughout Motor: Strength is 5/5 in the bilateral upper and lower extremities.  Shoulder shrug is equal and symmetric.  There is no pronator drift.  MOVEMENT EXAM: Tremor: Very minor tremor is noted with Archimedes spirals prior to programming.  Almost no tremor is noted after programming today.    Chemistry      Component Value Date/Time   NA 139 07/07/2019 0559   K 3.9 07/07/2019 0559   CL 104 07/07/2019 0559   CO2 23 07/07/2019 0559   BUN 24 (H) 07/07/2019 0559   CREATININE 1.78 (H) 07/07/2019 0559      Component Value Date/Time   CALCIUM 9.1 07/07/2019 0559   ALKPHOS 78 02/02/2014 1108   AST 33 02/02/2014 1108   ALT 26 02/02/2014 1108   BILITOT 0.3 02/02/2014 1108     Lab Results  Component Value Date   WBC 11.6 (H) 07/07/2019   HGB 13.5 07/07/2019   HCT 41.3 07/07/2019   MCV 91.0 07/07/2019   PLT 299 07/07/2019   DBS programming was performed today, which is described in more detail on a separate programming procedure note.     Assessment/Plan:   1.  Essential Tremor, overall moderate  - Patient underwent left VIM DBS on July 07, 2019.  The intention was originally to do bilateral VIM DBS, but the patient had a vagal episode in the operating room with associated nausea and vomiting.  He lost CSF during the episode, and it was decided  not to attempt the other side at that point in time.  Patient has opted to hold on having a lead placed in the right VIM.  He did still have tremor on the left, but it is fairly mild  -Patient had his IPG placed on July 14, 2019.    -Postop programming was done today.  -We reviewed with the patient how to use his patient controller.   2.  Peripheral neuropathy  -The patient has clinical examination evidence of a diffuse peripheral neuropathy, which certainly can affect gait and balance.  We discussed safety associated with peripheral neuropathy.  His is likely due to DM.  We discussed the importance of controlling the diabetes, especially if we are to consider deep brain stimulation surgery.  3.   Right metatarsal head painful callus  -Patient is scheduled to have surgery in September 30  -Discussed with the patient that he should be turning off his DBS for this surgery.  Would like them to put the cautery pad on his leg, as opposed to his back  4.  Follow-up 1 year, sooner should new neurologic issues arise.  Much greater than 50% of this visit was spent in counseling and coordinating care.  Total face to face time:  15 min, not including DBS time (most of the visit)    CC:  [name], MD

## 2019-08-18 NOTE — Telephone Encounter (Signed)
"  Mr. Zeisloft has two insurances, [employer] and [place].  Are we filing with both insurances or just one?  If we are filing with both, we'll need authorization from the [place]."  I think we're only filing with [facility].  I'll call the patient to make sure.  When is he scheduled?  "He's scheduled for September 30."  I left the patient a message to call me back regarding his insurance.

## 2019-08-22 ENCOUNTER — Other Ambulatory Visit: Payer: Self-pay

## 2019-08-22 ENCOUNTER — Ambulatory Visit (INDEPENDENT_AMBULATORY_CARE_PROVIDER_SITE_OTHER): Payer: Medicare Other | Admitting: Neurology

## 2019-08-22 ENCOUNTER — Encounter: Payer: Self-pay | Admitting: Neurology

## 2019-08-22 ENCOUNTER — Encounter: Payer: Self-pay | Admitting: Podiatry

## 2019-08-22 ENCOUNTER — Ambulatory Visit (INDEPENDENT_AMBULATORY_CARE_PROVIDER_SITE_OTHER): Payer: Medicare Other | Admitting: Podiatry

## 2019-08-22 VITALS — BP 162/82 | HR 86 | Wt 266.2 lb

## 2019-08-22 DIAGNOSIS — L97529 Non-pressure chronic ulcer of other part of left foot with unspecified severity: Secondary | ICD-10-CM | POA: Diagnosis not present

## 2019-08-22 DIAGNOSIS — E11621 Type 2 diabetes mellitus with foot ulcer: Secondary | ICD-10-CM | POA: Diagnosis not present

## 2019-08-22 DIAGNOSIS — Z9689 Presence of other specified functional implants: Secondary | ICD-10-CM | POA: Diagnosis not present

## 2019-08-22 DIAGNOSIS — G250 Essential tremor: Secondary | ICD-10-CM

## 2019-08-22 DIAGNOSIS — L97519 Non-pressure chronic ulcer of other part of right foot with unspecified severity: Secondary | ICD-10-CM

## 2019-08-22 MED ORDER — CEPHALEXIN 500 MG PO CAPS: 500.0000 mg | ORAL_CAPSULE | Freq: Three times a day (TID) | ORAL | 0 refills | Status: DC

## 2019-08-22 MED ORDER — PROMETHAZINE HCL 25 MG PO TABS: 25.0000 mg | ORAL_TABLET | Freq: Three times a day (TID) | ORAL | 0 refills | Status: DC | PRN

## 2019-08-22 MED ORDER — HYDROCODONE-ACETAMINOPHEN 5-325 MG PO TABS: 1.0000 | ORAL_TABLET | ORAL | 0 refills | Status: DC | PRN

## 2019-08-22 NOTE — Progress Notes (Signed)
Subjective: 68 year old male presents the office today for follow evaluation of a painful callus on the right foot.  He states that he is still on antibiotics and he has a small meta bloody drainage but no pus.  No swelling or redness.  He states he is eager to have a surgery later this week given the ongoing discomfort to the right foot.  No swelling or redness that he reports. Denies any systemic concerns such as fevers, chills, nausea, vomiting. No acute changes since last appointment, and no other complaints at this time.   Objective: AAO x3, NAD DP/PT pulses palpable bilaterally, CRT less than 3 seconds On the right foot submetatarsal 5 there is a thick hyperkeratotic lesion with dried blood.  Upon debridement there is a small superficial granular wound present measuring approximate 0.2 x 0.1 cm there is no probing to bone, undermining or tunneling.  No surrounding erythema, ascending cellulitis.  There is no fluctuation dilatation and there is no malodor.   No pain with calf compression, swelling, warmth, erythema  Assessment: Recurrent ulceration right foot submetatarsal 5, improving  Plan: -All treatment options discussed with the patient including all alternatives, risks, complications.  -Sharply debrided the hyperkeratotic tissue today.  On continue antibiotic ointment dressing changes daily finish course of antibiotics.  Clinically does not appear infected we will proceed with surgery on Wednesday as scheduled for fifth metatarsal head excision.  Again discussed the surgery as well as the postoperative course and wished to proceed. -Postoperative medications into the pharmacy.  No follow-ups on file.  Trula Slade DPM

## 2019-08-22 NOTE — Procedures (Signed)
DBS Programming was performed.    Manufacturer of DBS device: [place]  Total time spent programming was 45 minutes.  Device was confirmed on.  Soft start was confirmed to be on.  Impedences were checked and were within normal limits.  Battery was checked and was determined to be functioning normally and not near the end of life.  Battery charge reviewed and was good.  Final settings were as follows:  Program 1:  Left brain electrode:     5-C+           ; Amplitude  2.2  ma   ; Pulse width 60 microseconds;   Frequency   130   Hz.  Right brain electrode:    Not applicable  Program 2:  Left brain electrode: 5-8+; amplitude 2.30ma; PW 60 msec; frequency: 130  Pt did well on both programs. Left on program 1  Monopolar review: Left brain electrode:     1-C+           ; Amplitude  1.0   ma   ; Pulse width 60 microseconds;   Frequency   130   Hz.  (Tongue paresthesias) Left brain electrode:     (2-3-4-) 33% each C+           ; Amplitude  2.0   ma   ; Pulse width 60 microseconds;   Frequency   130   Hz.  (Tongue and lip paresthesias, fairly good tremor control) Left brain electrode:     (5-6-7-) 33% C+           ; Amplitude  2.0   ma   ; Pulse width 60 microseconds;   Frequency   130   Hz.  (Tongue paresthesias but no lip paresthesia and good tremor control) Left brain electrode:     8-C+           ; Amplitude  1.0   ma   ; Pulse width 60 microseconds;   Frequency   130   Hz.  (Paresthesias resolved)

## 2019-08-22 NOTE — Patient Instructions (Signed)

## 2019-08-23 ENCOUNTER — Telehealth: Payer: Self-pay | Admitting: *Deleted

## 2019-08-23 NOTE — Telephone Encounter (Addendum)
DOS 08/24/2019 METATARSAL HEAD RESECTION 5TH RIGHT FOOT - 28113  [employer]HC MEDICARE: Eligibility Date -  11/24/2018 - Present  Plan Deductible Member's plan does not have a deductible.  Out-of-Pocket Maximum Per Service Year $1,447.39 of $4,000.00 Met Remaining: $2,552.61  [facility] $0.00 Copayment for professional services rendered to you as an Outpatient.   

## 2019-08-24 ENCOUNTER — Encounter: Payer: Self-pay | Admitting: Podiatry

## 2019-08-24 DIAGNOSIS — M21541 Acquired clubfoot, right foot: Secondary | ICD-10-CM | POA: Diagnosis not present

## 2019-08-26 ENCOUNTER — Telehealth: Payer: Self-pay | Admitting: *Deleted

## 2019-08-26 NOTE — Telephone Encounter (Signed)
Pt states he just missed a call from our office and he just wanted to let Dr. Jacqualyn Posey know he is doing fine, no bleeding and he is ready to play golf, no need to call unless we need him.

## 2019-08-26 NOTE — Telephone Encounter (Signed)
Called and left a message for the patient to call me back and I was calling to see how patient was doing after surgery on Wednesday 08-24-2019 with Dr Jacqualyn Posey and stated to call me and just let me know how patient was doing. Joshua [name]

## 2019-08-28 ENCOUNTER — Emergency Department (HOSPITAL_COMMUNITY): Payer: Medicare Other

## 2019-08-28 ENCOUNTER — Inpatient Hospital Stay (HOSPITAL_COMMUNITY)
Admission: EM | Admit: 2019-08-28 | Discharge: 2019-08-31 | DRG: 065 | Disposition: A | Discharge status: Home / Self Care | Payer: Medicare Other | Attending: Family Medicine | Admitting: Family Medicine

## 2019-08-28 ENCOUNTER — Other Ambulatory Visit: Payer: Self-pay

## 2019-08-28 DIAGNOSIS — Z9049 Acquired absence of other specified parts of digestive tract: Secondary | ICD-10-CM

## 2019-08-28 DIAGNOSIS — Z7982 Long term (current) use of aspirin: Secondary | ICD-10-CM | POA: Diagnosis not present

## 2019-08-28 DIAGNOSIS — Z8673 Personal history of transient ischemic attack (TIA), and cerebral infarction without residual deficits: Secondary | ICD-10-CM

## 2019-08-28 DIAGNOSIS — Z79899 Other long term (current) drug therapy: Secondary | ICD-10-CM

## 2019-08-28 DIAGNOSIS — R4701 Aphasia: Secondary | ICD-10-CM | POA: Diagnosis not present

## 2019-08-28 DIAGNOSIS — Z85820 Personal history of malignant melanoma of skin: Secondary | ICD-10-CM

## 2019-08-28 DIAGNOSIS — Z87891 Personal history of nicotine dependence: Secondary | ICD-10-CM | POA: Diagnosis not present

## 2019-08-28 DIAGNOSIS — Z86711 Personal history of pulmonary embolism: Secondary | ICD-10-CM

## 2019-08-28 DIAGNOSIS — E1151 Type 2 diabetes mellitus with diabetic peripheral angiopathy without gangrene: Secondary | ICD-10-CM | POA: Diagnosis present

## 2019-08-28 DIAGNOSIS — E669 Obesity, unspecified: Secondary | ICD-10-CM | POA: Diagnosis present

## 2019-08-28 DIAGNOSIS — I63119 Cerebral infarction due to embolism of unspecified vertebral artery: Secondary | ICD-10-CM

## 2019-08-28 DIAGNOSIS — I10 Essential (primary) hypertension: Secondary | ICD-10-CM

## 2019-08-28 DIAGNOSIS — Z794 Long term (current) use of insulin: Secondary | ICD-10-CM

## 2019-08-28 DIAGNOSIS — I1310 Hypertensive heart and chronic kidney disease without heart failure, with stage 1 through stage 4 chronic kidney disease, or unspecified chronic kidney disease: Secondary | ICD-10-CM | POA: Diagnosis present

## 2019-08-28 DIAGNOSIS — E1165 Type 2 diabetes mellitus with hyperglycemia: Secondary | ICD-10-CM | POA: Diagnosis present

## 2019-08-28 DIAGNOSIS — E785 Hyperlipidemia, unspecified: Secondary | ICD-10-CM | POA: Diagnosis present

## 2019-08-28 DIAGNOSIS — Z9989 Dependence on other enabling machines and devices: Secondary | ICD-10-CM

## 2019-08-28 DIAGNOSIS — E1122 Type 2 diabetes mellitus with diabetic chronic kidney disease: Secondary | ICD-10-CM | POA: Diagnosis present

## 2019-08-28 DIAGNOSIS — G250 Essential tremor: Secondary | ICD-10-CM | POA: Diagnosis present

## 2019-08-28 DIAGNOSIS — I748 Embolism and thrombosis of other arteries: Secondary | ICD-10-CM | POA: Diagnosis present

## 2019-08-28 DIAGNOSIS — E1169 Type 2 diabetes mellitus with other specified complication: Secondary | ICD-10-CM | POA: Diagnosis present

## 2019-08-28 DIAGNOSIS — K219 Gastro-esophageal reflux disease without esophagitis: Secondary | ICD-10-CM | POA: Diagnosis present

## 2019-08-28 DIAGNOSIS — R29702 NIHSS score 2: Secondary | ICD-10-CM | POA: Diagnosis present

## 2019-08-28 DIAGNOSIS — E663 Overweight: Secondary | ICD-10-CM | POA: Diagnosis present

## 2019-08-28 DIAGNOSIS — G8194 Hemiplegia, unspecified affecting left nondominant side: Secondary | ICD-10-CM | POA: Diagnosis present

## 2019-08-28 DIAGNOSIS — I6340 Cerebral infarction due to embolism of unspecified cerebral artery: Secondary | ICD-10-CM | POA: Insufficient documentation

## 2019-08-28 DIAGNOSIS — Z20828 Contact with and (suspected) exposure to other viral communicable diseases: Secondary | ICD-10-CM | POA: Diagnosis present

## 2019-08-28 DIAGNOSIS — G4733 Obstructive sleep apnea (adult) (pediatric): Secondary | ICD-10-CM | POA: Diagnosis present

## 2019-08-28 DIAGNOSIS — N1831 Chronic kidney disease, stage 3a: Secondary | ICD-10-CM | POA: Diagnosis not present

## 2019-08-28 DIAGNOSIS — Z6832 Body mass index (BMI) 32.0-32.9, adult: Secondary | ICD-10-CM | POA: Diagnosis not present

## 2019-08-28 DIAGNOSIS — Z86718 Personal history of other venous thrombosis and embolism: Secondary | ICD-10-CM

## 2019-08-28 DIAGNOSIS — R1310 Dysphagia, unspecified: Secondary | ICD-10-CM | POA: Diagnosis present

## 2019-08-28 DIAGNOSIS — Z96652 Presence of left artificial knee joint: Secondary | ICD-10-CM | POA: Diagnosis present

## 2019-08-28 DIAGNOSIS — I6309 Cerebral infarction due to thrombosis of other precerebral artery: Principal | ICD-10-CM | POA: Diagnosis present

## 2019-08-28 DIAGNOSIS — I517 Cardiomegaly: Secondary | ICD-10-CM

## 2019-08-28 DIAGNOSIS — E119 Type 2 diabetes mellitus without complications: Secondary | ICD-10-CM | POA: Diagnosis not present

## 2019-08-28 DIAGNOSIS — I6389 Other cerebral infarction: Secondary | ICD-10-CM | POA: Diagnosis not present

## 2019-08-28 DIAGNOSIS — N189 Chronic kidney disease, unspecified: Secondary | ICD-10-CM | POA: Diagnosis present

## 2019-08-28 DIAGNOSIS — N183 Chronic kidney disease, stage 3 unspecified: Secondary | ICD-10-CM | POA: Diagnosis present

## 2019-08-28 DIAGNOSIS — I639 Cerebral infarction, unspecified: Secondary | ICD-10-CM

## 2019-08-28 DIAGNOSIS — R471 Dysarthria and anarthria: Secondary | ICD-10-CM | POA: Diagnosis present

## 2019-08-28 DIAGNOSIS — I742 Embolism and thrombosis of arteries of the upper extremities: Secondary | ICD-10-CM | POA: Diagnosis not present

## 2019-08-28 HISTORY — DX: Chronic kidney disease, unspecified: N18.9

## 2019-08-28 LAB — DIFFERENTIAL
Abs Immature Granulocytes: 0.03 10*3/uL (ref 0.00–0.07)
Basophils Absolute: 0.1 10*3/uL (ref 0.0–0.1)
Basophils Relative: 1 %
Eosinophils Absolute: 0.3 10*3/uL (ref 0.0–0.5)
Eosinophils Relative: 4 %
Immature Granulocytes: 0 %
Lymphocytes Relative: 37 %
Lymphs Abs: 3 10*3/uL (ref 0.7–4.0)
Monocytes Absolute: 0.6 10*3/uL (ref 0.1–1.0)
Monocytes Relative: 8 %
Neutro Abs: 4.1 10*3/uL (ref 1.7–7.7)
Neutrophils Relative %: 50 %

## 2019-08-28 LAB — COMPREHENSIVE METABOLIC PANEL
ALT: 16 U/L (ref 0–44)
AST: 22 U/L (ref 15–41)
Albumin: 3.3 g/dL — ABNORMAL LOW (ref 3.5–5.0)
Alkaline Phosphatase: 81 U/L (ref 38–126)
Anion gap: 8 (ref 5–15)
BUN: 25 mg/dL — ABNORMAL HIGH (ref 8–23)
CO2: 27 mmol/L (ref 22–32)
Calcium: 9.3 mg/dL (ref 8.9–10.3)
Chloride: 105 mmol/L (ref 98–111)
Creatinine, Ser: 1.51 mg/dL — ABNORMAL HIGH (ref 0.61–1.24)
GFR calc Af Amer: 53 mL/min — ABNORMAL LOW (ref >60)
GFR calc non Af Amer: 46 mL/min — ABNORMAL LOW (ref >60)
Glucose, Bld: 196 mg/dL — ABNORMAL HIGH (ref 70–99)
Potassium: 4.2 mmol/L (ref 3.5–5.1)
Sodium: 140 mmol/L (ref 135–145)
Total Bilirubin: 0.5 mg/dL (ref 0.3–1.2)
Total Protein: 6.7 g/dL (ref 6.5–8.1)

## 2019-08-28 LAB — URINALYSIS, ROUTINE W REFLEX MICROSCOPIC
Bacteria, UA: NONE SEEN
Bilirubin Urine: NEGATIVE
Glucose, UA: 150 mg/dL — AB
Hgb urine dipstick: NEGATIVE
Ketones, ur: NEGATIVE mg/dL
Leukocytes,Ua: NEGATIVE
Nitrite: NEGATIVE
Protein, ur: >=300 mg/dL — AB
Specific Gravity, Urine: 1.024 (ref 1.005–1.030)
pH: 7 (ref 5.0–8.0)

## 2019-08-28 LAB — I-STAT CHEM 8, ED
BUN: 31 mg/dL — ABNORMAL HIGH (ref 8–23)
Calcium, Ion: 1.2 mmol/L (ref 1.15–1.40)
Chloride: 105 mmol/L (ref 98–111)
Creatinine, Ser: 1.4 mg/dL — ABNORMAL HIGH (ref 0.61–1.24)
Glucose, Bld: 198 mg/dL — ABNORMAL HIGH (ref 70–99)
HCT: 39 % (ref 39.0–52.0)
Hemoglobin: 13.3 g/dL (ref 13.0–17.0)
Potassium: 4.2 mmol/L (ref 3.5–5.1)
Sodium: 141 mmol/L (ref 135–145)
TCO2: 29 mmol/L (ref 22–32)

## 2019-08-28 LAB — PROTIME-INR
INR: 1.1 (ref 0.8–1.2)
Prothrombin Time: 13.9 seconds (ref 11.4–15.2)

## 2019-08-28 LAB — HIV ANTIBODY (ROUTINE TESTING W REFLEX): HIV Screen 4th Generation wRfx: NONREACTIVE

## 2019-08-28 LAB — CBC
HCT: 38.8 % — ABNORMAL LOW (ref 39.0–52.0)
Hemoglobin: 12.9 g/dL — ABNORMAL LOW (ref 13.0–17.0)
MCH: 29.8 pg (ref 26.0–34.0)
MCHC: 33.2 g/dL (ref 30.0–36.0)
MCV: 89.6 fL (ref 80.0–100.0)
Platelets: 281 10*3/uL (ref 150–400)
RBC: 4.33 MIL/uL (ref 4.22–5.81)
RDW: 12.8 % (ref 11.5–15.5)
WBC: 8.1 10*3/uL (ref 4.0–10.5)
nRBC: 0 % (ref 0.0–0.2)

## 2019-08-28 LAB — RAPID URINE DRUG SCREEN, HOSP PERFORMED
Amphetamines: NOT DETECTED
Barbiturates: NOT DETECTED
Benzodiazepines: NOT DETECTED
Cocaine: NOT DETECTED
Opiates: NOT DETECTED
Tetrahydrocannabinol: NOT DETECTED

## 2019-08-28 LAB — CBG MONITORING, ED
Glucose-Capillary: 181 mg/dL — ABNORMAL HIGH (ref 70–99)
Glucose-Capillary: 122 mg/dL — ABNORMAL HIGH (ref 70–99)
Glucose-Capillary: 103 mg/dL — ABNORMAL HIGH (ref 70–99)

## 2019-08-28 LAB — APTT: aPTT: 34 seconds (ref 24–36)

## 2019-08-28 LAB — SARS CORONAVIRUS 2 (TAT 6-24 HRS): SARS Coronavirus 2: NEGATIVE

## 2019-08-28 MED ORDER — INSULIN ASPART 100 UNIT/ML ~~LOC~~ SOLN: 0.0000 [IU] | Freq: Every day | SUBCUTANEOUS | Status: DC

## 2019-08-28 MED ORDER — SODIUM CHLORIDE 0.9 % IV BOLUS
500.0000 mL | Freq: Once | INTRAVENOUS | Status: AC
  Administered 2019-08-28: 500 mL via INTRAVENOUS

## 2019-08-28 MED ORDER — IOHEXOL 350 MG/ML SOLN
100.0000 mL | Freq: Once | INTRAVENOUS | Status: AC | PRN
  Administered 2019-08-28: 100 mL via INTRAVENOUS

## 2019-08-28 MED ORDER — ACETAMINOPHEN 325 MG PO TABS: 650.0000 mg | ORAL_TABLET | ORAL | Status: DC | PRN

## 2019-08-28 MED ORDER — SODIUM CHLORIDE 0.9 % IV SOLN
100.0000 mL/h | INTRAVENOUS | Status: DC
  Administered 2019-08-28: 100 mL/h via INTRAVENOUS

## 2019-08-28 MED ORDER — INSULIN ASPART 100 UNIT/ML ~~LOC~~ SOLN
0.0000 [IU] | Freq: Three times a day (TID) | SUBCUTANEOUS | Status: DC
  Administered 2019-08-28 – 2019-08-29 (×2): 2 [IU] via SUBCUTANEOUS
  Administered 2019-08-30: 3 [IU] via SUBCUTANEOUS
  Administered 2019-08-30: 5 [IU] via SUBCUTANEOUS
  Administered 2019-08-30: 3 [IU] via SUBCUTANEOUS
  Administered 2019-08-31: 5 [IU] via SUBCUTANEOUS
  Administered 2019-08-31: 13:00:00 3 [IU] via SUBCUTANEOUS

## 2019-08-28 MED ORDER — ACETAMINOPHEN 160 MG/5ML PO SOLN: 650.0000 mg | ORAL | Status: DC | PRN

## 2019-08-28 MED ORDER — ONDANSETRON HCL 4 MG/2ML IJ SOLN
4.0000 mg | Freq: Once | INTRAMUSCULAR | Status: AC
  Administered 2019-08-28: 4 mg via INTRAVENOUS
  Filled 2019-08-28: qty 2

## 2019-08-28 MED ORDER — ATORVASTATIN CALCIUM 80 MG PO TABS
80.0000 mg | ORAL_TABLET | Freq: Every day | ORAL | Status: DC
  Administered 2019-08-28 – 2019-08-30 (×3): 80 mg via ORAL
  Filled 2019-08-28 (×3): qty 1

## 2019-08-28 MED ORDER — HEPARIN BOLUS VIA INFUSION
4000.0000 [IU] | Freq: Once | INTRAVENOUS | Status: AC
  Administered 2019-08-28: 4000 [IU] via INTRAVENOUS
  Filled 2019-08-28: qty 4000

## 2019-08-28 MED ORDER — ASPIRIN EC 81 MG PO TBEC
81.0000 mg | DELAYED_RELEASE_TABLET | Freq: Every day | ORAL | Status: DC
  Administered 2019-08-28: 81 mg via ORAL
  Filled 2019-08-28: qty 1

## 2019-08-28 MED ORDER — SODIUM CHLORIDE 0.9 % IV SOLN
INTRAVENOUS | Status: DC
  Administered 2019-08-28 – 2019-08-31 (×3): via INTRAVENOUS

## 2019-08-28 MED ORDER — STROKE: EARLY STAGES OF RECOVERY BOOK
Freq: Once | Status: DC
  Filled 2019-08-28: qty 1

## 2019-08-28 MED ORDER — ONDANSETRON HCL 4 MG/2ML IJ SOLN: 4.0000 mg | Freq: Four times a day (QID) | INTRAMUSCULAR | Status: DC | PRN

## 2019-08-28 MED ORDER — HEPARIN (PORCINE) 25000 UT/250ML-% IV SOLN
1500.0000 [IU]/h | INTRAVENOUS | Status: DC
  Administered 2019-08-28: 1500 [IU]/h via INTRAVENOUS
  Filled 2019-08-28: qty 250

## 2019-08-28 MED ORDER — ACETAMINOPHEN 650 MG RE SUPP: 650.0000 mg | RECTAL | Status: DC | PRN

## 2019-08-28 NOTE — ED Notes (Signed)
IV therapist got an IV established.  Heparin gtts restarted at 1500units/hr at this time.

## 2019-08-28 NOTE — Consult Note (Signed)
Hospital Consult    Reason for Consult: Left subclavian artery thrombus Referring Physician: Neurology MRN #:  [id number]  History of Present Illness: This is a 68 y.o. male with history of hypertension, diabetes, dyslipidemia, vertigo, tremor that neurology has consulted vascular surgery for further evaluation of left subclavian thrombus in the setting of suspected cerebellar infarct.  Patient reportedly developed some dysarthria last night after he got out of the shower.  He states that he then had some difficulty swallowing,particularly water.  After his wife came home from church today he felt his symptoms were getting worse, talked to his PCP, instructed to come to ED.  He has had some unsteadiness as well in the setting of chronic vertigo.  Ultimately presented to the ED and was evaluated by neurology.  Neurology suspects he had a small cerebellar stroke.  There was findings of some intraluminal thrombus in the left subclavian artery.  Patient states he had a stroke years ago.  He had loss of his left retinal vision after that stroke.  In regards to previous blood clots he states he had a PE after knee replacement was on Eliquis for 6 months.  He is retired Pharmacist, hospital.  Past Medical History:  Diagnosis Date  . Benign essential tremor   . Benign positional vertigo   . CVA (cerebral vascular accident) ([place])    x2 - L retina, 1 right parietal  . Degenerative arthritis   . Depression   . Diabetes mellitus   . Dyslipidemia   . GERD (gastroesophageal reflux disease)    hiatal hernia  . Gout   . H/O: vasectomy   . Hearing aid worn    b/l  . Hx of appendectomy   . Hx of tonsillectomy   . Hypertension   . 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 Melanoma ([place])   . Obesity   . OSA on CPAP    setting = 5  . Tremor, essential 06/22/2017  . Wears glasses     Past Surgical History:  Procedure Laterality Date  . APPENDECTOMY    . arthroscopic knee surgery Bilateral   . CATARACT  EXTRACTION Bilateral   . COLONOSCOPY    . MINOR PLACEMENT OF FIDUCIAL N/A 06/30/2019   Procedure: Fiducial placement;  Surgeon: Erline Levine, MD;  Location: [place];  Service: Neurosurgery;  Laterality: N/A;  Fiducial placement  . NASAL SEPTUM SURGERY    . PULSE GENERATOR IMPLANT N/A 07/14/2019   Procedure: Left cranial Implanted Pulse Generator and lead extension placement to right chest ;  Surgeon: Erline Levine, MD;  Location: [place];  Service: Neurosurgery;  Laterality: N/A;  . SUBTHALAMIC STIMULATOR INSERTION Left 07/07/2019   Procedure: LEFT DEEP BRAIN STIMULATOR PLACEMENT;  Surgeon: Erline Levine, MD;  Location: [place];  Service: Neurosurgery;  Laterality: Left;  . TONSILLECTOMY    . TOTAL KNEE ARTHROPLASTY Left 03/30/2017   Procedure: LEFT TOTAL KNEE ARTHROPLASTY;  Surgeon: Paralee Cancel, MD;  Location: [facility];  Service: Orthopedics;  Laterality: Left;  . WISDOM TOOTH EXTRACTION      No Known Allergies  Prior to Admission medications   Medication Sig Start Date End Date Taking? Authorizing Provider  amLODipine (NORVASC) 5 MG tablet Take 5 mg by mouth at bedtime.  09/28/18  Yes [provider]  aspirin 81 MG tablet Take 1 tablet (81 mg total) by mouth daily. 04/08/18  Yes McCue, Janett Billow, NP  atorvastatin (LIPITOR) 40 MG tablet Take 40 mg by mouth at bedtime.  Yes [provider]  cephALEXin (KEFLEX) 500 MG capsule Take 1 capsule (500 mg total) by mouth 3 (three) times daily. 08/22/19  Yes Trula Slade, DPM  DULoxetine (CYMBALTA) 60 MG capsule Take 60 mg by mouth daily.   Yes [provider]  fenofibrate 54 MG tablet Take 54 mg by mouth daily.   Yes [provider]  hydrochlorothiazide (HYDRODIURIL) 25 MG tablet Take 25 mg by mouth daily.   Yes [provider]  Insulin Glargine, 1 Unit Dial, (TOUJEO SOLOSTAR) 300 UNIT/ML SOPN Inject 85 Units into the skin 2 (two) times daily.   Yes [provider]  insulin lispro (HUMALOG KWIKPEN) 100  UNIT/ML KwikPen Inject 15-25 Units into the skin See admin instructions. Inject 15 units subcutaneously after breakfast and 25 units after lunch and supper   Yes [provider]  losartan (COZAAR) 100 MG tablet Take 100 mg by mouth daily.   Yes [provider]  Lutein-Zeaxanthin 25-5 MG CAPS Take 1 tablet by mouth daily.   Yes [provider]  metFORMIN (GLUCOPHAGE) 1000 MG tablet Take 1,000 mg by mouth 2 (two) times daily with a meal.   Yes [provider]  Multiple Vitamin (MULTIVITAMIN WITH MINERALS) TABS tablet Take 1 tablet by mouth daily.   Yes [provider]  Omega-3 Fatty Acids (FISH OIL) 1000 MG CAPS Take 2,000 mg by mouth daily. Reported on 02/13/2016   Yes [provider]  Polyethyl Glycol-Propyl Glycol (SYSTANE OP) Place 1-2 drops into both eyes 4 (four) times daily as needed (dry eyes).    Yes [provider]  PRESCRIPTION MEDICATION Inhale into the lungs at bedtime. CPAP   Yes [provider]  HYDROcodone-acetaminophen (NORCO/VICODIN) 5-325 MG tablet Take 1-2 tablets by mouth every 4 (four) hours as needed for severe pain ((score 7 to 10)). Patient not taking: Reported on 08/28/2019 07/14/19   Erline Levine, MD  HYDROcodone-acetaminophen (NORCO/VICODIN) 5-325 MG tablet Take 1 tablet by mouth every 4 (four) hours as needed. Patient not taking: Reported on 08/28/2019 08/22/19   Trula Slade, DPM  mupirocin ointment (BACTROBAN) 2 % Apply 1 application topically 2 (two) times daily. Patient not taking: Reported on 08/28/2019 08/26/18   Trula Slade, DPM  [facility] VERIO test strip  09/13/18   [provider]  promethazine (PHENERGAN) 25 MG tablet Take 1 tablet (25 mg total) by mouth every 8 (eight) hours as needed for nausea or vomiting. Patient not taking: Reported on 08/28/2019 08/22/19   Trula Slade, DPM    Social History   Socioeconomic History  . Marital status: Married    Spouse name:  [name]  . Number of children: 2  . Years of education: Bachelors   . Highest education level: Bachelor's degree (e.g., BA, AB, BS)  Occupational History  . Occupation: retired    Fish farm manager: [employer]    Comment: teaching/coaching  Social Needs  . Financial resource strain: Not on file  . Food insecurity    Worry: Not on file    Inability: Not on file  . Transportation needs    Medical: Not on file    Non-medical: Not on file  Tobacco Use  . Smoking status: Former Smoker    Packs/day: 1.00    Years: 10.00    Pack years: 10.00    Types: Cigarettes    Quit date: 11/25/1983    Years since quitting: 35.7  . Smokeless tobacco: Never Used  Substance and Sexual Activity  .  Alcohol use: Yes    Alcohol/week: 2.0 standard drinks    Types: 2 Standard drinks or equivalent per week    Comment: occassionally  . Drug use: No  . Sexual activity: Not on file  Lifestyle  . Physical activity    Days per week: Not on file    Minutes per session: Not on file  . Stress: Not on file  Relationships  . Social Herbalist on phone: Not on file    Gets together: Not on file    Attends religious service: Not on file    Active member of club or organization: Not on file    Attends meetings of clubs or organizations: Not on file    Relationship status: Not on file  . Intimate partner violence    Fear of current or ex partner: Not on file    Emotionally abused: Not on file    Physically abused: Not on file    Forced sexual activity: Not on file  Other Topics Concern  . Not on file  Social History Narrative   Patient lives at home with wife. [name](   Patient has 2 children that are in good health.    Patient works for [employer]. Retired .   Patient has a Bachelors degree in History.      Family History  Problem Relation Age of Onset  . Cerebral aneurysm Mother   . Tremor Mother   . Alzheimer's disease Father   . Tremor Brother   . Tremor Maternal Uncle    . Diabetes Maternal Grandmother   . Healthy Son   . Colon cancer Neg Hx     ROS: [x]  Positive   [ ]  Negative   [ ]  All sytems reviewed and are negative  Cardiovascular: []  chest pain/pressure []  palpitations []  SOB lying flat []  DOE []  pain in legs while walking []  pain in legs at rest []  pain in legs at night []  non-healing ulcers []  hx of DVT []  swelling in legs  Pulmonary: []  productive cough []  asthma/wheezing []  home O2  Neurologic: []  weakness in []  arms []  legs []  numbness in []  arms []  legs []  hx of CVA []  mini stroke [x] difficulty speaking or slurred speech []  temporary loss of vision in one eye []  dizziness X difficulty swallowing Hematologic: []  hx of cancer []  bleeding problems []  problems with blood clotting easily  Endocrine:   []  diabetes []  thyroid disease  GI []  vomiting blood []  blood in stool  GU: []  CKD/renal failure []  HD--[]  M/W/F or []  T/T/S []  burning with urination []  blood in urine  Psychiatric: []  anxiety []  depression  Musculoskeletal: []  arthritis []  joint pain  Integumentary: []  rashes []  ulcers  Constitutional: []  fever []  chills   Physical Examination  Vitals:   08/28/19 1415 08/28/19 1430  BP: (!) 167/89 (!) 170/96  Pulse: 85   Resp: 15 16  Temp:    SpO2: 96%    Body mass index is 32.14 kg/m.  General:  WDWN in NAD Gait: Not observed HENT: WNL, normocephalic Pulmonary: normal non-labored breathing, without Rales, rhonchi,  wheezing Cardiac: regular, without  Murmurs, rubs or gallops Abdomen: soft, NT/ND, no masses Vascular Exam/Pulses:  Right Left  Radial 2+ (normal) 2+ (normal)  Ulnar    Femoral 2+ (normal) 2+ (normal)  Popliteal    DP 2+ (normal) 2+ (normal)  PT 2+ (normal) 2+ (normal)   Extremities: without ischemic changes, without Gangrene , without  cellulitis; sutures on left foot Musculoskeletal: no muscle wasting or atrophy  Neurologic: A&O X 3; Appropriate Affect ; SENSATION:  normal; MOTOR FUNCTION:  moving all extremities equally. Speech is fluent/normal   CBC    Component Value Date/Time   WBC 8.1 08/28/2019 1300   RBC 4.33 08/28/2019 1300   HGB 13.3 08/28/2019 1316   HCT 39.0 08/28/2019 1316   PLT 281 08/28/2019 1300   MCV 89.6 08/28/2019 1300   MCH 29.8 08/28/2019 1300   MCHC 33.2 08/28/2019 1300   RDW 12.8 08/28/2019 1300   LYMPHSABS 3.0 08/28/2019 1300   MONOABS 0.6 08/28/2019 1300   EOSABS 0.3 08/28/2019 1300   BASOSABS 0.1 08/28/2019 1300    BMET    Component Value Date/Time   NA 141 08/28/2019 1316   K 4.2 08/28/2019 1316   CL 105 08/28/2019 1316   CO2 27 08/28/2019 1300   GLUCOSE 198 (H) 08/28/2019 1316   BUN 31 (H) 08/28/2019 1316   CREATININE 1.40 (H) 08/28/2019 1316   CALCIUM 9.3 08/28/2019 1300   GFRNONAA 46 (L) 08/28/2019 1300   GFRAA 53 (L) 08/28/2019 1300    COAGS: Lab Results  Component Value Date   INR 1.1 08/28/2019   INR 1.16 04/09/2017     Non-Invasive Vascular Imaging:     CTA neck: On my review there is very small piece of intraluminal thrombus in the proximal left subclavian artery with some underlying atherosclerotic disease here, no flow limiting stenosis.  The vessel measures 20 mm at level of thrombus.  ASSESSMENT/PLAN: This is a 68 y.o. male that presents with suspected small cerebellar infarct in the setting of left subclavian ostial thrombus.  This is a fairly small piece of thrombus with some underlying plaque in the artery (only measures 5 mm).  Discussed with neurology that I would recommend full anticoagulation.  We could repeat imaging in 48 to 72 hours to see how this progresses.  Other option would be placing a covered stent but this is a fairly small piece of thrombus adherent to some plaque and the subclavian artery here measures almost 20 mm (arteriomegaly) and I do not think we have a stent that would fit this artery on the shelf.  Discussed with patient plan of care.  Marty Heck, MD  [facility] Office: [phone] Pager: [phone]

## 2019-08-28 NOTE — H&P (Addendum)
History and Physical    Joshua [name] [id number] DOB: [birth year] DOA: 08/28/2019  PCP: [name], MD  Patient coming from: Home I have personally briefly reviewed patient's old medical records in [place]  Chief Complaint: Dysarthria, weakness and nausea  HPI: Joshua [name] is a 68 y.o. male with medical history significant of hypertension, hyperlipidemia, diabetes mellitus, obstructive sleep apnea on CPAP, essential tremors status post deep brain stimulator placement on 05/07/2019, obesity, CVA presents to emergency department due to slurred speech, generalized weakness since last night and nausea since this morning.  Wife reports that patient had slurred speech started at 9 PM associated with generalized weakness however this morning his symptoms started getting worse and had constant nausea so she called 911 and brought patient to emergency department for further evaluation.  Patient reports dysphagia, headache, chronic dizziness, denies loss of consciousness, seizure, head trauma, blurry vision, chest pain, shortness of breath, palpitation, leg swelling, fever, chills, cough, congestion, epigastric pain, urinary or bowel changes.  He lives with his wife and mother-in-law at home.  Denies smoking, alcohol, illicit drug use.  He is independent on daily life activities.  As per wife patient had left knee surgery in [id number] which was complicated with pulmonary embolism and patient was on Eliquis for 6 months.  Patient denies family history of DVT/PE.  Had right foot surgery (metatarsal head resection fifth right foot) by podiatry 3 days ago and was placed on p.o. Keflex.   ED Course: CT angiogram of head, neck, cerebral perfusion shows subclavian artery thrombosis, no acute infarct or occlusion.  Patient evaluated by neurology in the ED.  Recommended MRI and start on heparin drip.  Review of Systems: As per HPI otherwise negative.    Past Medical History:  Diagnosis Date    Benign essential tremor    Benign positional vertigo    CVA (cerebral vascular accident) ([place])    x2 - L retina, 1 right parietal   Degenerative arthritis    Depression    Diabetes mellitus    Dyslipidemia    GERD (gastroesophageal reflux disease)    hiatal hernia   Gout    H/O: vasectomy    Hearing aid worn    b/l   Hx of appendectomy    Hx of tonsillectomy    Hypertension    Ischemic optic neuropathy    on the left   Melanoma ([place])    Obesity    OSA on CPAP    setting = 5   Tremor, essential 06/22/2017   Wears glasses     Past Surgical History:  Procedure Laterality Date   APPENDECTOMY     arthroscopic knee surgery Bilateral    CATARACT EXTRACTION Bilateral    COLONOSCOPY     MINOR PLACEMENT OF FIDUCIAL N/A 06/30/2019   Procedure: Fiducial placement;  Surgeon: Erline Levine, MD;  Location: [facility];  Service: Neurosurgery;  Laterality: N/A;  Fiducial placement   NASAL SEPTUM SURGERY     PULSE GENERATOR IMPLANT N/A 07/14/2019   Procedure: Left cranial Implanted Pulse Generator and lead extension placement to right chest ;  Surgeon: Erline Levine, MD;  Location: [facility];  Service: Neurosurgery;  Laterality: N/A;   SUBTHALAMIC STIMULATOR INSERTION Left 07/07/2019   Procedure: LEFT DEEP BRAIN STIMULATOR PLACEMENT;  Surgeon: Erline Levine, MD;  Location: [facility];  Service: Neurosurgery;  Laterality: Left;   TONSILLECTOMY     TOTAL KNEE ARTHROPLASTY Left 03/30/2017   Procedure: LEFT TOTAL KNEE ARTHROPLASTY;  Surgeon:  Paralee Cancel, MD;  Location: [facility];  Service: Orthopedics;  Laterality: Left;   WISDOM TOOTH EXTRACTION       reports that he quit smoking about 35 years ago. His smoking use included cigarettes. He has a 10.00 pack-year smoking history. He has never used smokeless tobacco. He reports current alcohol use of about 2.0 standard drinks of alcohol per week. He reports that he does not use drugs.  No Known Allergies  Family History  Problem  Relation Age of Onset   Cerebral aneurysm Mother    Tremor Mother    Alzheimer's disease Father    Tremor Brother    Tremor Maternal Uncle    Diabetes Maternal Grandmother    Healthy Son    Colon cancer Neg Hx     Prior to Admission medications   Medication Sig Start Date End Date Taking? Authorizing Provider  amLODipine (NORVASC) 5 MG tablet Take 5 mg by mouth every evening.  09/28/18   [provider]  aspirin 81 MG tablet Take 1 tablet (81 mg total) by mouth daily. 04/08/18   Frann Rider, NP  atorvastatin (LIPITOR) 40 MG tablet Take 40 mg by mouth every evening.     [provider]  cephALEXin (KEFLEX) 500 MG capsule Take 1 capsule (500 mg total) by mouth 3 (three) times daily. 05/23/19   Trula Slade, DPM  cephALEXin (KEFLEX) 500 MG capsule Take 1 capsule (500 mg total) by mouth 3 (three) times daily. 08/22/19   Trula Slade, DPM  DULoxetine (CYMBALTA) 60 MG capsule Take 60 mg by mouth daily.    [provider]  fenofibrate 54 MG tablet Take 54 mg by mouth daily.    [provider]  HUMALOG KWIKPEN 100 UNIT/ML KwikPen Inject 25 Units into the skin 3 (three) times daily with meals.  01/27/19   [provider]  hydrochlorothiazide (HYDRODIURIL) 25 MG tablet Take 25 mg by mouth daily.    [provider]  HYDROcodone-acetaminophen (NORCO/VICODIN) 5-325 MG tablet Take 1-2 tablets by mouth every 4 (four) hours as needed for severe pain ((score 7 to 10)). 07/14/19   Erline Levine, MD  HYDROcodone-acetaminophen (NORCO/VICODIN) 5-325 MG tablet Take 1 tablet by mouth every 4 (four) hours as needed. 08/22/19   Trula Slade, DPM  losartan (COZAAR) 100 MG tablet Take 100 mg by mouth daily.    [provider]  Lutein-Zeaxanthin 25-5 MG CAPS Take 1 tablet by mouth daily.    [provider]  metFORMIN (GLUCOPHAGE) 1000 MG tablet Take 1,000 mg by mouth 2 (two) times daily with a meal.    [provider]   Multiple Vitamin (MULTIVITAMIN WITH MINERALS) TABS tablet Take 1 tablet by mouth daily.    [provider]  mupirocin ointment (BACTROBAN) 2 % Apply 1 application topically 2 (two) times daily. 08/26/18   Trula Slade, DPM  Omega-3 Fatty Acids (FISH OIL) 1000 MG CAPS Take 2,000 mg by mouth daily. Reported on 02/13/2016    [provider]  [facility] VERIO test strip  09/13/18   [provider]  Polyethyl Glycol-Propyl Glycol (SYSTANE OP) Place 1-2 drops into both eyes 4 (four) times daily as needed (dry eyes).     [provider]  promethazine (PHENERGAN) 25 MG tablet Take 1 tablet (25 mg total) by mouth every 8 (eight) hours as needed for nausea or vomiting. 08/22/19   Trula Slade, DPM  TOUJEO SOLOSTAR 300 UNIT/ML SOPN Inject 80 Units into the skin  2 (two) times daily. Breakfast and bedtime 07/31/15   [provider]    Physical Exam: Vitals:   08/28/19 1300 08/28/19 1315 08/28/19 1415 08/28/19 1430  BP: (!) 145/92 (!) 138/94 (!) 167/89 (!) 170/96  Pulse: 86 85 85   Resp: 18 14 15 16   Temp:      TempSrc:      SpO2: 95% 96% 96%   Weight:      Height:        Constitutional: NAD, calm, comfortable Vitals:   08/28/19 1300 08/28/19 1315 08/28/19 1415 08/28/19 1430  BP: (!) 145/92 (!) 138/94 (!) 167/89 (!) 170/96  Pulse: 86 85 85   Resp: 18 14 15 16   Temp:      TempSrc:      SpO2: 95% 96% 96%   Weight:      Height:       Constitutional: Alert and oriented x3, not in acute distress, has slurred speech  eyes: PERRL, lids and conjunctivae normal ENMT: Mucous membranes are moist. Posterior pharynx clear of any exudate or lesions.Normal dentition.  Neck: normal, supple, no masses, no thyromegaly Respiratory: clear to auscultation bilaterally, no wheezing, no crackles. Normal respiratory effort. No accessory muscle use.  Cardiovascular: Regular rate and rhythm, no murmurs / rubs / gallops. No extremity edema. 2+ pedal pulses. No carotid  bruits.  Abdomen: no tenderness, no masses palpated. No hepatosplenomegaly. Bowel sounds positive.  Musculoskeletal: no clubbing / cyanosis. No joint deformity upper and lower extremities. Good ROM, no contractures. Normal muscle tone.  Left foot: Incision and stitches on lateral aspect of left foot appears stable, no signs of bleeding or infection, no redness or swelling noted. Skin: no rashes, lesions, ulcers. No induration Neurologic: CN 2-12 grossly intact. Sensation intact, DTR normal. Strength 5/5 in all 4.  Psychiatric: Normal judgment and insight. Alert and oriented x 3. Normal mood.    Labs on Admission: I have personally reviewed following labs and imaging studies  CBC: Recent Labs  Lab 08/28/19 1300 08/28/19 1316  WBC 8.1  --   NEUTROABS 4.1  --   HGB 12.9* 13.3  HCT 38.8* 39.0  MCV 89.6  --   PLT 281  --    Basic Metabolic Panel: Recent Labs  Lab 08/28/19 1300 08/28/19 1316  NA 140 141  K 4.2 4.2  CL 105 105  CO2 27  --   GLUCOSE 196* 198*  BUN 25* 31*  CREATININE 1.51* 1.40*  CALCIUM 9.3  --    GFR: Estimated Creatinine Clearance: 69.4 mL/min (A) (by C-G formula based on SCr of 1.4 mg/dL (H)). Liver Function Tests: Recent Labs  Lab 08/28/19 1300  AST 22  ALT 16  ALKPHOS 81  BILITOT 0.5  PROT 6.7  ALBUMIN 3.3*   No results for input(s): LIPASE, AMYLASE in the last 168 hours. No results for input(s): AMMONIA in the last 168 hours. Coagulation Profile: Recent Labs  Lab 08/28/19 1300  INR 1.1   Cardiac Enzymes: No results for input(s): CKTOTAL, CKMB, CKMBINDEX, TROPONINI in the last 168 hours. BNP (last 3 results) No results for input(s): PROBNP in the last 8760 hours. HbA1C: No results for input(s): HGBA1C in the last 72 hours. CBG: Recent Labs  Lab 08/28/19 1300  GLUCAP 181*   Lipid Profile: No results for input(s): CHOL, HDL, LDLCALC, TRIG, CHOLHDL, LDLDIRECT in the last 72 hours. Thyroid Function Tests: No results for input(s): TSH,  T4TOTAL, FREET4, T3FREE, THYROIDAB in the last 72 hours. Anemia Panel:  No results for input(s): VITAMINB12, FOLATE, FERRITIN, TIBC, IRON, RETICCTPCT in the last 72 hours. Urine analysis:    Component Value Date/Time   COLORURINE YELLOW 08/28/2019 1413   APPEARANCEUR CLEAR 08/28/2019 1413   LABSPEC 1.024 08/28/2019 1413   PHURINE 7.0 08/28/2019 1413   GLUCOSEU 150 (A) 08/28/2019 1413   HGBUR NEGATIVE 08/28/2019 1413   BILIRUBINUR NEGATIVE 08/28/2019 1413   KETONESUR NEGATIVE 08/28/2019 1413   PROTEINUR >=300 (A) 08/28/2019 1413   UROBILINOGEN 0.2 04/08/2011 0732   NITRITE NEGATIVE 08/28/2019 1413   LEUKOCYTESUR NEGATIVE 08/28/2019 1413    Radiological Exams on Admission: Ct Code Stroke Cta Head W/wo Contrast  Addendum Date: 08/28/2019   ADDENDUM REPORT: 08/28/2019 15:19 ADDENDUM: Study reviewed by telephone with Dr. Roland Rack on 08/28/2019 at 15:15. He states that clinically he is suspicious for a small cerebellar infarct, which makes the suspected small thrombus in the proximal left subclavian artery more likely to be the dominant finding. We discussed that the left MCA middle division M3 branch appearance might be related to streak artifact from the DBS. Electronically Signed   By: Genevie Ann M.D.   On: 08/28/2019 15:19   Result Date: 08/28/2019 CLINICAL DATA:  68 year old male with slurred speech. History of deep brain stimulator. EXAM: CT ANGIOGRAPHY HEAD AND NECK CT PERFUSION BRAIN TECHNIQUE: Multidetector CT imaging of the head and neck was performed using the standard protocol during bolus administration of intravenous contrast. Multiplanar CT image reconstructions and MIPs were obtained to evaluate the vascular anatomy. Carotid stenosis measurements (when applicable) are obtained utilizing NASCET criteria, using the distal internal carotid diameter as the denominator. Multiphase CT imaging of the brain was performed following IV bolus contrast injection. Subsequent parametric  perfusion maps were calculated using RAPID software. CONTRAST:  142mL OMNIPAQUE IOHEXOL 350 MG/ML SOLN COMPARISON:  Head CT 07/07/2019 and earlier. CTA head and neck 04/09/2011. FINDINGS: CT HEAD FINDINGS Brain: Resolved postoperative pneumocephalus since last month. The left deep brain stimulator device terminates at the left thalamus as before. No acute intracranial hemorrhage identified. No midline shift, mass effect, or evidence of intracranial mass lesion. No ventriculomegaly. Evidence of a chronic right thalamic lacunar infarct is unchanged. Patchy white matter hypodensity in both hemispheres appears stable. No cortically based acute infarct identified. Vascular: Calcified atherosclerosis at the skull base. Skull: Vertex burr holes.  No acute osseous abnormality identified. Sinuses/Orbits: Visualized paranasal sinuses and mastoids are stable and well pneumatized. Other: Left vertex approach electrical lead with removed skin staples and resolved scalp gas since the postoperative CT last month. Electrode has now been tunneled across the vertex into the right scalp and neck. Stable orbits soft tissues, with a small preseptal infraorbital calcification or retained foreign body as before. This has been present since 2012, with 2 successful brain MRI since that time. ASPECTS [facility] Stroke Program Early CT Score) Total score (0-10 with 10 being normal): 10 Review of the MIP images confirms the above findings CT Brain Perfusion Findings: CBF (<30%) Volume: 0 mL Perfusion (Tmax>6.0s) volume: 0 mL Mismatch Volume: Not applicable Infarction Location:Not applicable CTA NECK Skeleton: No acute osseous abnormality identified. Multilevel cervical spine ankylosis in part related to flowing anterior osteophytes. Upper chest: Right chest generator device now in place. Otherwise negative. Other neck: Right neck stimulator lead tracking to the vertex, no adverse features. Otherwise stable since 2012. Aortic arch: 3 vessel arch  configuration. Mild arch calcified plaque. Mild arch ectasia. Right carotid system: Tortuous brachiocephalic artery and proximal right CCA without stenosis. Calcified  plaque at the right ICA origin and bulb has not significantly changed since 2012, less than 50 % stenosis with respect to the distal vessel. Highly tortuous cervical right ICA without stenosis to the skull base. Left carotid system: No left CCA origin plaque or stenosis. Calcified plaque at the left ICA origin and bulb has mildly progressed since 2012. Less than 50 % stenosis with respect to the distal vessel. Highly tortuous cervical left ICA which is mildly dolichoectatic to the skull base. Vertebral arteries: Mild plaque in the proximal right subclavian artery without stenosis. Normal right vertebral artery origin. Patent right vertebral artery to the skull base without stenosis. Right V2 tortuosity. 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 There is nearby soft and calcified plaque which appears more stable. There is no proximal left subclavian artery stenosis. The left vertebral artery origin remains patent with mild to moderate stenosis (series 13, image 170). The left vertebral artery is tortuous, mildly dominant, and patent to the skull base without additional stenosis. Tortuous left V1 segment CTA HEAD Posterior circulation: No distal vertebral artery stenosis, the left is mildly dominant. Normal right PICA origin. Patent vertebrobasilar junction without stenosis. Patent basilar artery without stenosis. The left AICA may be dominant, unchanged. Patent SCA and PCA origins. Mild right P1 irregularity and stenosis is new on series 16, image 27. Bilateral PCA branches are otherwise stable and without significant stenosis. Anterior circulation: Both ICA siphons are patent. On the left there is mild calcified plaque without stenosis. Normal right ophthalmic artery  origin. On the right there is mild to moderate calcified plaque without stenosis. Normal right ophthalmic artery origin. Posterior communicating arteries are diminutive or absent. Dominant left ACA A1 segment. MCA and left ACA origins are mildly ectatic. Diminutive right A1. Anterior communicating artery is mildly ectatic. Bilateral ACA branches are stable and within normal limits. Left MCA M1 and bifurcation are patent without stenosis. Suggestion of moderate new left MCA middle in 3 branch irregularity and stenosis since 2012. Right MCA M1 segment, bifurcation, and right MCA branches appear patent and stable since 2012. Venous sinuses: Patent. Anatomic variants: Mildly dominant left vertebral artery. Dominant left and diminutive right ACA A1 segments. Review of the MIP images confirms the above findings IMPRESSION: 1. Negative for large vessel occlusion, and no core infarct or ischemic penumbra identified by CT Perfusion. 2. Positive for a small 5 mm focus of adherent thrombus in the proximal left subclavian artery on series 10, image 169. Nearby chronic left subclavian plaque. 3. No large vessel stenosis in the head or neck, but suggestion of Left MCA middle division M3 branch moderate irregularity and stenosis since 2012. Unclear if this might be related to a possible recent thromboembolic event implied by #2. 4. Satisfactory postoperative CT appearance of the brain following left deep brain stimulator device in August. 5. Bilateral carotid atherosclerosis with mild progression since 2012. No significant stenosis. Study discussed by telephone with Dr. Roland Rack on 08/28/2019 at 14:30 . Electronically Signed: By: Genevie Ann M.D. On: 08/28/2019 14:31   Ct Code Stroke Cta Neck W/wo Contrast  Addendum Date: 08/28/2019   ADDENDUM REPORT: 08/28/2019 15:19 ADDENDUM: Study reviewed by telephone with Dr. Roland Rack on 08/28/2019 at 15:15. He states that clinically he is suspicious for a small cerebellar  infarct, which makes the suspected small thrombus in the proximal left subclavian artery more likely to be the dominant finding. We  discussed that the left MCA middle division M3 branch appearance might be related to streak artifact from the DBS. Electronically Signed   By: Genevie Ann M.D.   On: 08/28/2019 15:19   Result Date: 08/28/2019 CLINICAL DATA:  68 year old male with slurred speech. History of deep brain stimulator. EXAM: CT ANGIOGRAPHY HEAD AND NECK CT PERFUSION BRAIN TECHNIQUE: Multidetector CT imaging of the head and neck was performed using the standard protocol during bolus administration of intravenous contrast. Multiplanar CT image reconstructions and MIPs were obtained to evaluate the vascular anatomy. Carotid stenosis measurements (when applicable) are obtained utilizing NASCET criteria, using the distal internal carotid diameter as the denominator. Multiphase CT imaging of the brain was performed following IV bolus contrast injection. Subsequent parametric perfusion maps were calculated using RAPID software. CONTRAST:  179mL OMNIPAQUE IOHEXOL 350 MG/ML SOLN COMPARISON:  Head CT 07/07/2019 and earlier. CTA head and neck 04/09/2011. FINDINGS: CT HEAD FINDINGS Brain: Resolved postoperative pneumocephalus since last month. The left deep brain stimulator device terminates at the left thalamus as before. No acute intracranial hemorrhage identified. No midline shift, mass effect, or evidence of intracranial mass lesion. No ventriculomegaly. Evidence of a chronic right thalamic lacunar infarct is unchanged. Patchy white matter hypodensity in both hemispheres appears stable. No cortically based acute infarct identified. Vascular: Calcified atherosclerosis at the skull base. Skull: Vertex burr holes.  No acute osseous abnormality identified. Sinuses/Orbits: Visualized paranasal sinuses and mastoids are stable and well pneumatized. Other: Left vertex approach electrical lead with removed skin staples and  resolved scalp gas since the postoperative CT last month. Electrode has now been tunneled across the vertex into the right scalp and neck. Stable orbits soft tissues, with a small preseptal infraorbital calcification or retained foreign body as before. This has been present since 2012, with 2 successful brain MRI since that time. ASPECTS [facility] Stroke Program Early CT Score) Total score (0-10 with 10 being normal): 10 Review of the MIP images confirms the above findings CT Brain Perfusion Findings: CBF (<30%) Volume: 0 mL Perfusion (Tmax>6.0s) volume: 0 mL Mismatch Volume: Not applicable Infarction Location:Not applicable CTA NECK Skeleton: No acute osseous abnormality identified. Multilevel cervical spine ankylosis in part related to flowing anterior osteophytes. Upper chest: Right chest generator device now in place. Otherwise negative. Other neck: Right neck stimulator lead tracking to the vertex, no adverse features. Otherwise stable since 2012. Aortic arch: 3 vessel arch configuration. Mild arch calcified plaque. Mild arch ectasia. Right carotid system: Tortuous brachiocephalic artery and proximal right CCA without stenosis. Calcified plaque at the right ICA origin and bulb has not significantly changed since 2012, less than 50 % stenosis with respect to the distal vessel. Highly tortuous cervical right ICA without stenosis to the skull base. Left carotid system: No left CCA origin plaque or stenosis. Calcified plaque at the left ICA origin and bulb has mildly progressed since 2012. Less than 50 % stenosis with respect to the distal vessel. Highly tortuous cervical left ICA which is mildly dolichoectatic to the skull base. Vertebral arteries: Mild plaque in the proximal right subclavian artery without stenosis. Normal right vertebral artery origin. Patent right vertebral artery to the skull base without stenosis. Right V2 tortuosity. There is a small focus of adherent thrombus in the proximal left subclavian  artery on series 10, image 169, new since 2012. There is nearby soft and calcified plaque which appears more stable. There is no proximal left subclavian artery stenosis. The left vertebral artery origin remains patent with mild to moderate  stenosis (series 13, image 170). The left vertebral artery is tortuous, mildly dominant, and patent to the skull base without additional stenosis. Tortuous left V1 segment CTA HEAD Posterior circulation: No distal vertebral artery stenosis, the left is mildly dominant. Normal right PICA origin. Patent vertebrobasilar junction without stenosis. Patent basilar artery without stenosis. The left AICA may be dominant, unchanged. Patent SCA and PCA origins. Mild right P1 irregularity and stenosis is new on series 16, image 27. Bilateral PCA branches are otherwise stable and without significant stenosis. Anterior circulation: Both ICA siphons are patent. On the left there is mild calcified plaque without stenosis. Normal right ophthalmic artery origin. On the right there is mild to moderate calcified plaque without stenosis. Normal right ophthalmic artery origin. Posterior communicating arteries are diminutive or absent. Dominant left ACA A1 segment. MCA and left ACA origins are mildly ectatic. Diminutive right A1. Anterior communicating artery is mildly ectatic. Bilateral ACA branches are stable and within normal limits. Left MCA M1 and bifurcation are patent without stenosis. Suggestion of moderate new left MCA middle in 3 branch irregularity and stenosis since 2012. Right MCA M1 segment, bifurcation, and right MCA branches appear patent and stable since 2012. Venous sinuses: Patent. Anatomic variants: Mildly dominant left vertebral artery. Dominant left and diminutive right ACA A1 segments. Review of the MIP images confirms the above findings IMPRESSION: 1. Negative for large vessel occlusion, and no core infarct or ischemic penumbra identified by CT Perfusion. 2. Positive for a  small 5 mm focus of adherent thrombus in the proximal left subclavian artery on series 10, image 169. Nearby chronic left subclavian plaque. 3. No large vessel stenosis in the head or neck, but suggestion of Left MCA middle division M3 branch moderate irregularity and stenosis since 2012. Unclear if this might be related to a possible recent thromboembolic event implied by #2. 4. Satisfactory postoperative CT appearance of the brain following left deep brain stimulator device in August. 5. Bilateral carotid atherosclerosis with mild progression since 2012. No significant stenosis. Study discussed by telephone with Dr. Roland Rack on 08/28/2019 at 14:30 . Electronically Signed: By: Genevie Ann M.D. On: 08/28/2019 14:31   Ct Code Stroke Cta Cerebral Perfusion W/wo Contrast  Addendum Date: 08/28/2019   ADDENDUM REPORT: 08/28/2019 15:19 ADDENDUM: Study reviewed by telephone with Dr. Roland Rack on 08/28/2019 at 15:15. He states that clinically he is suspicious for a small cerebellar infarct, which makes the suspected small thrombus in the proximal left subclavian artery more likely to be the dominant finding. We discussed that the left MCA middle division M3 branch appearance might be related to streak artifact from the DBS. Electronically Signed   By: Genevie Ann M.D.   On: 08/28/2019 15:19   Result Date: 08/28/2019 CLINICAL DATA:  68 year old male with slurred speech. History of deep brain stimulator. EXAM: CT ANGIOGRAPHY HEAD AND NECK CT PERFUSION BRAIN TECHNIQUE: Multidetector CT imaging of the head and neck was performed using the standard protocol during bolus administration of intravenous contrast. Multiplanar CT image reconstructions and MIPs were obtained to evaluate the vascular anatomy. Carotid stenosis measurements (when applicable) are obtained utilizing NASCET criteria, using the distal internal carotid diameter as the denominator. Multiphase CT imaging of the brain was performed following IV  bolus contrast injection. Subsequent parametric perfusion maps were calculated using RAPID software. CONTRAST:  18mL OMNIPAQUE IOHEXOL 350 MG/ML SOLN COMPARISON:  Head CT 07/07/2019 and earlier. CTA head and neck 04/09/2011. FINDINGS: CT HEAD FINDINGS Brain: Resolved postoperative pneumocephalus since last  month. The left deep brain stimulator device terminates at the left thalamus as before. No acute intracranial hemorrhage identified. No midline shift, mass effect, or evidence of intracranial mass lesion. No ventriculomegaly. Evidence of a chronic right thalamic lacunar infarct is unchanged. Patchy white matter hypodensity in both hemispheres appears stable. No cortically based acute infarct identified. Vascular: Calcified atherosclerosis at the skull base. Skull: Vertex burr holes.  No acute osseous abnormality identified. Sinuses/Orbits: Visualized paranasal sinuses and mastoids are stable and well pneumatized. Other: Left vertex approach electrical lead with removed skin staples and resolved scalp gas since the postoperative CT last month. Electrode has now been tunneled across the vertex into the right scalp and neck. Stable orbits soft tissues, with a small preseptal infraorbital calcification or retained foreign body as before. This has been present since 2012, with 2 successful brain MRI since that time. ASPECTS [facility] Stroke Program Early CT Score) Total score (0-10 with 10 being normal): 10 Review of the MIP images confirms the above findings CT Brain Perfusion Findings: CBF (<30%) Volume: 0 mL Perfusion (Tmax>6.0s) volume: 0 mL Mismatch Volume: Not applicable Infarction Location:Not applicable CTA NECK Skeleton: No acute osseous abnormality identified. Multilevel cervical spine ankylosis in part related to flowing anterior osteophytes. Upper chest: Right chest generator device now in place. Otherwise negative. Other neck: Right neck stimulator lead tracking to the vertex, no adverse features.  Otherwise stable since 2012. Aortic arch: 3 vessel arch configuration. Mild arch calcified plaque. Mild arch ectasia. Right carotid system: Tortuous brachiocephalic artery and proximal right CCA without stenosis. Calcified plaque at the right ICA origin and bulb has not significantly changed since 2012, less than 50 % stenosis with respect to the distal vessel. Highly tortuous cervical right ICA without stenosis to the skull base. Left carotid system: No left CCA origin plaque or stenosis. Calcified plaque at the left ICA origin and bulb has mildly progressed since 2012. Less than 50 % stenosis with respect to the distal vessel. Highly tortuous cervical left ICA which is mildly dolichoectatic to the skull base. Vertebral arteries: Mild plaque in the proximal right subclavian artery without stenosis. Normal right vertebral artery origin. Patent right vertebral artery to the skull base without stenosis. Right V2 tortuosity. 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 There is nearby soft and calcified plaque which appears more stable. There is no proximal left subclavian artery stenosis. The left vertebral artery origin remains patent with mild to moderate stenosis (series 13, image 170). The left vertebral artery is tortuous, mildly dominant, and patent to the skull base without additional stenosis. Tortuous left V1 segment CTA HEAD Posterior circulation: No distal vertebral artery stenosis, the left is mildly dominant. Normal right PICA origin. Patent vertebrobasilar junction without stenosis. Patent basilar artery without stenosis. The left AICA may be dominant, unchanged. Patent SCA and PCA origins. Mild right P1 irregularity and stenosis is new on series 16, image 27. Bilateral PCA branches are otherwise stable and without significant stenosis. Anterior circulation: Both ICA siphons are patent. On the left there is mild calcified  plaque without stenosis. Normal right ophthalmic artery origin. On the right there is mild to moderate calcified plaque without stenosis. Normal right ophthalmic artery origin. Posterior communicating arteries are diminutive or absent. Dominant left ACA A1 segment. MCA and left ACA origins are mildly ectatic. Diminutive right A1. Anterior communicating artery is mildly ectatic. Bilateral ACA branches are stable and within normal  limits. Left MCA M1 and bifurcation are patent without stenosis. Suggestion of moderate new left MCA middle in 3 branch irregularity and stenosis since 2012. Right MCA M1 segment, bifurcation, and right MCA branches appear patent and stable since 2012. Venous sinuses: Patent. Anatomic variants: Mildly dominant left vertebral artery. Dominant left and diminutive right ACA A1 segments. Review of the MIP images confirms the above findings IMPRESSION: 1. Negative for large vessel occlusion, and no core infarct or ischemic penumbra identified by CT Perfusion. 2. Positive for a small 5 mm focus of adherent thrombus in the proximal left subclavian artery on series 10, image 169. Nearby chronic left subclavian plaque. 3. No large vessel stenosis in the head or neck, but suggestion of Left MCA middle division M3 branch moderate irregularity and stenosis since 2012. Unclear if this might be related to a possible recent thromboembolic event implied by #2. 4. Satisfactory postoperative CT appearance of the brain following left deep brain stimulator device in August. 5. Bilateral carotid atherosclerosis with mild progression since 2012. No significant stenosis. Study discussed by telephone with Dr. Roland Rack on 08/28/2019 at 14:30 . Electronically Signed: By: Genevie Ann M.D. On: 08/28/2019 14:31    EKG: Normal sinus rhythm, premature arterial complexes, no acute ST elevation or depression noted.  Assessment/Plan Principal Problem:   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HCC) Active Problems:    Diabetes mellitus type 2 in obese (HCC)   GERD   OSA on CPAP   Essential hypertension   Essential tremor   Hyperlipidemia   Chronic kidney disease   Obesity (BMI 30-39.9)    Subclavian artery thrombosis: -Patient presented with dysarthria-risk factors: Hypertension, hyperlipidemia, diabetes, obesity.  Developed pulmonary embolism after left knee t replacement surgery in 2018. -Reviewed CT angiogram of head, neck and cerebral perfusion which showed subclavian artery thrombosis. -admit forTelemetry monitoring -Allow for permissive hypertension for the first 24-48h - only treat PRN if SBP 123456 mmHg or diastolic blood pressure 123XX123. Blood pressures can be gradually normalized to SBP<140 upon discharge. -Pharmacy consult for full dose heparin -Ordered echocardiogram to- rule out PFO -Ordered lipid Panel,  and A1C -Frequent neuro checks -Ordered aspirin 81 mg p.o. daily and atorvastatin 80 mg once daily. -Consult Neurology-appreciate help. -Vascular surgeon Dr. Clark-recommended anticoagulation with heparin at this time. -PT/OT eval, Speech consult  Hypertension: Blood pressure is stable currently. -We will hold home blood pressure medicines-amlodipine, HCTZ, losartan for now to allow permissive hypertension. -Monitor blood pressure closely.  Diabetes mellitus: Check A1c. -He is on metformin and insulin at home.  Hold home p.o. medicine metformin.  Start on sliding scale insulin. -Monitor blood sugar closely  Hyperlipidemia: Check lipid panel -Started on atorvastatin.  Essential tremors: -Status post deep brain stimulator placement on 05/07/2019  Obstructive sleep apnea on CPAP at home -We will continue.  CKD: Stable -Likely secondary to chronic diabetes and hypertension -Monitor BMP closely.  Avoid nephrotoxic medication.  DVT prophylaxis: On full dose heparin  code Status: Full code  family Communication: wife present at bedside.  Plan of care discussed with patient and his  wife in length and they verbalized understanding and agreed with it. Disposition Plan: TBD Consults called: Neurology Dr. Leonel Ramsay admission status: Inpatient  [name] MD [facility] Pager [phone]  If 7PM-7AM, please contact night-coverage [email or web address] Password [facility]  08/28/2019, 4:35 PM

## 2019-08-28 NOTE — ED Provider Notes (Signed)
[facility] EMERGENCY DEPARTMENT Provider Note   CSN: [id number] Arrival date & time: 08/28/19  1230     History   Chief Complaint Chief Complaint  Patient presents with   Weakness   Aphasia    HPI Joshua [name] is a 68 y.o. male.     HPI  Patient presents with aphasia. Patient with multiple medical issues including essential tremor for which he had implanted brain stimulator device inserted 2 months ago. He has a history of stroke as well. Yesterday, about 9 PM, roughly 18 hours prior to ED arrival the patient noticed difficulty with speech, generalized discomfort. Since that time the speech difficulty and generalized discomfort of been persistent without focal pain, without syncope, without vision loss. Today, the patient felt too poorly to even go to church, and was brought here for evaluation. He denies focal discomfort, confusion, disorientation, as above He does have some ongoing issues with right foot where he recently had excision of a callus He has been following up appropriately with his surgeon in this regard, denies complications.  No recent medication change, diet change, activity change.  Past Medical History:  Diagnosis Date   Benign essential tremor    Benign positional vertigo    CVA (cerebral vascular accident) ([place])    x2 - L retina, 1 right parietal   Degenerative arthritis    Depression    Diabetes mellitus    Dyslipidemia    GERD (gastroesophageal reflux disease)    hiatal hernia   Gout    H/O: vasectomy    Hearing aid worn    b/l   Hx of appendectomy    Hx of tonsillectomy    Hypertension    Ischemic optic neuropathy    on the left   Melanoma ([place])    Obesity    OSA on CPAP    setting = 5   Tremor, essential 06/22/2017   Wears glasses     Patient Active Problem List   Diagnosis Date Noted   Tremor 07/07/2019   Essential tremor 06/22/2017   OSA on CPAP 04/10/2017   Essential  hypertension 04/10/2017   Pulmonary thromboembolism ([place]) 04/10/2017   PE (pulmonary thromboembolism) ([place]) 04/09/2017   S/P left TKA 03/30/2017   S/P total knee replacement 03/30/2017   Cellulitis of left foot 03/07/2016   Pre-ulcerative calluses 03/07/2016   Type 2 diabetes mellitus with left diabetic foot ulcer ([place]) 01/14/2016   Diabetes mellitus type 2 in obese ([place]) 08/07/2008   GERD 08/07/2008    Past Surgical History:  Procedure Laterality Date   APPENDECTOMY     arthroscopic knee surgery Bilateral    CATARACT EXTRACTION Bilateral    COLONOSCOPY     MINOR PLACEMENT OF FIDUCIAL N/A 06/30/2019   Procedure: Fiducial placement;  Surgeon: Erline Levine, MD;  Location: [facility];  Service: Neurosurgery;  Laterality: N/A;  Fiducial placement   NASAL SEPTUM SURGERY     PULSE GENERATOR IMPLANT N/A 07/14/2019   Procedure: Left cranial Implanted Pulse Generator and lead extension placement to right chest ;  Surgeon: Erline Levine, MD;  Location: [place];  Service: Neurosurgery;  Laterality: N/A;   SUBTHALAMIC STIMULATOR INSERTION Left 07/07/2019   Procedure: LEFT DEEP BRAIN STIMULATOR PLACEMENT;  Surgeon: Erline Levine, MD;  Location: [facility];  Service: Neurosurgery;  Laterality: Left;   TONSILLECTOMY     TOTAL KNEE ARTHROPLASTY Left 03/30/2017   Procedure: LEFT TOTAL KNEE ARTHROPLASTY;  Surgeon: Paralee Cancel, MD;  Location: [facility];  Service: Orthopedics;  Laterality: Left;   WISDOM TOOTH EXTRACTION          Home Medications    Prior to Admission medications   Medication Sig Start Date End Date Taking? Authorizing Provider  amLODipine (NORVASC) 5 MG tablet Take 5 mg by mouth every evening.  09/28/18   [provider]  aspirin 81 MG tablet Take 1 tablet (81 mg total) by mouth daily. 04/08/18   Frann Rider, NP  atorvastatin (LIPITOR) 40 MG tablet Take 40 mg by mouth every evening.     [provider]  cephALEXin (KEFLEX) 500 MG capsule Take 1 capsule (500  mg total) by mouth 3 (three) times daily. 05/23/19   Trula Slade, DPM  cephALEXin (KEFLEX) 500 MG capsule Take 1 capsule (500 mg total) by mouth 3 (three) times daily. 08/22/19   Trula Slade, DPM  DULoxetine (CYMBALTA) 60 MG capsule Take 60 mg by mouth daily.    [provider]  fenofibrate 54 MG tablet Take 54 mg by mouth daily.    [provider]  HUMALOG KWIKPEN 100 UNIT/ML KwikPen Inject 25 Units into the skin 3 (three) times daily with meals.  01/27/19   [provider]  hydrochlorothiazide (HYDRODIURIL) 25 MG tablet Take 25 mg by mouth daily.    [provider]  HYDROcodone-acetaminophen (NORCO/VICODIN) 5-325 MG tablet Take 1-2 tablets by mouth every 4 (four) hours as needed for severe pain ((score 7 to 10)). 07/14/19   Erline Levine, MD  HYDROcodone-acetaminophen (NORCO/VICODIN) 5-325 MG tablet Take 1 tablet by mouth every 4 (four) hours as needed. 08/22/19   Trula Slade, DPM  losartan (COZAAR) 100 MG tablet Take 100 mg by mouth daily.    [provider]  Lutein-Zeaxanthin 25-5 MG CAPS Take 1 tablet by mouth daily.    [provider]  metFORMIN (GLUCOPHAGE) 1000 MG tablet Take 1,000 mg by mouth 2 (two) times daily with a meal.    [provider]  Multiple Vitamin (MULTIVITAMIN WITH MINERALS) TABS tablet Take 1 tablet by mouth daily.    [provider]  mupirocin ointment (BACTROBAN) 2 % Apply 1 application topically 2 (two) times daily. 08/26/18   Trula Slade, DPM  Omega-3 Fatty Acids (FISH OIL) 1000 MG CAPS Take 2,000 mg by mouth daily. Reported on 02/13/2016    [provider]  [facility] VERIO test strip  09/13/18   [provider]  Polyethyl Glycol-Propyl Glycol (SYSTANE OP) Place 1-2 drops into both eyes 4 (four) times daily as needed (dry eyes).     [provider]  promethazine (PHENERGAN) 25 MG tablet Take 1 tablet (25 mg total) by mouth every 8 (eight) hours as needed  for nausea or vomiting. 08/22/19   Trula Slade, DPM  TOUJEO SOLOSTAR 300 UNIT/ML SOPN Inject 80 Units into the skin 2 (two) times daily. Breakfast and bedtime 07/31/15   [provider]    Family History Family History  Problem Relation Age of Onset   Cerebral aneurysm Mother    Tremor Mother    Alzheimer's disease Father    Tremor Brother    Tremor Maternal Uncle    Diabetes Maternal Grandmother    Healthy Son    Colon cancer Neg Hx     Social History Social History   Tobacco Use   Smoking status: Former Smoker    Packs/day: 1.00    Years: 10.00    Pack years: 10.00    Types: Cigarettes  Quit date: 11/25/1983    Years since quitting: 35.7   Smokeless tobacco: Never Used  Substance Use Topics   Alcohol use: Yes    Alcohol/week: 2.0 standard drinks    Types: 2 Standard drinks or equivalent per week    Comment: occassionally   Drug use: No     Allergies   Patient has no known allergies.   Review of Systems Review of Systems  Constitutional:       Per HPI, otherwise negative  HENT:       Per HPI, otherwise negative  Respiratory:       Per HPI, otherwise negative  Cardiovascular:       Per HPI, otherwise negative  Gastrointestinal: Negative for vomiting.  Endocrine:       Negative aside from HPI  Genitourinary:       Neg aside from HPI   Musculoskeletal:       Per HPI, otherwise negative  Skin: Positive for wound.  Neurological: Positive for tremors and speech difficulty. Negative for syncope.     Physical Exam Updated Vital Signs BP (!) 170/96    Pulse 85    Temp 98.6 F (37 C) (Oral)    Resp 16    Ht 6\' 4"  (1.93 m)    Wt 119.7 kg    SpO2 96%    BMI 32.14 kg/m   Physical Exam Vitals signs and nursing note reviewed.  Constitutional:      General: He is not in acute distress.    Appearance: He is well-developed.  HENT:     Head: Normocephalic and atraumatic.  Eyes:     Conjunctiva/sclera: Conjunctivae normal.   Cardiovascular:     Rate and Rhythm: Normal rate and regular rhythm.  Pulmonary:     Effort: Pulmonary effort is normal. No respiratory distress.     Breath sounds: No stridor.  Abdominal:     General: There is no distension.  Skin:    General: Skin is warm and dry.  Neurological:     Mental Status: He is alert. He is disoriented.     Motor: Tremor present.     Comments: Speech is slurred, slowed      ED Treatments / Results  Labs (all labs ordered are listed, but only abnormal results are displayed) Labs Reviewed  CBC - Abnormal; Notable for the following components:      Result Value   Hemoglobin 12.9 (*)    HCT 38.8 (*)    All other components within normal limits  COMPREHENSIVE METABOLIC PANEL - Abnormal; Notable for the following components:   Glucose, Bld 196 (*)    BUN 25 (*)    Creatinine, Ser 1.51 (*)    Albumin 3.3 (*)    GFR calc non Af Amer 46 (*)    GFR calc Af Amer 53 (*)    All other components within normal limits  URINALYSIS, ROUTINE W REFLEX MICROSCOPIC - Abnormal; Notable for the following components:   Glucose, UA 150 (*)    Protein, ur >=300 (*)    All other components within normal limits  I-STAT CHEM 8, ED - Abnormal; Notable for the following components:   BUN 31 (*)    Creatinine, Ser 1.40 (*)    Glucose, Bld 198 (*)    All other components within normal limits  CBG MONITORING, ED - Abnormal; Notable for the following components:   Glucose-Capillary 181 (*)    All other components within normal limits  SARS  CORONAVIRUS 2 (TAT 6-24 HRS)  PROTIME-INR  APTT  DIFFERENTIAL  RAPID URINE DRUG SCREEN, HOSP PERFORMED    EKG EKG Interpretation  Date/Time:  Sunday August 28 2019 12:43:01 EDT Ventricular Rate:  92 PR Interval:    QRS Duration: 117 QT Interval:  360 QTC Calculation: 446 R Axis:   -59 Text Interpretation:  Sinus rhythm Atrial premature complexes Borderline prolonged PR interval Left anterior fascicular block Anterior  infarct, old similar to older study (2012) Abnormal ECG Confirmed by Carmin Muskrat [phone]) on 08/28/2019 12:53:37 PM   Radiology Ct Code Stroke Cta Head W/wo Contrast  Result Date: 08/28/2019 CLINICAL DATA:  68 year old male with slurred speech. History of deep brain stimulator. EXAM: CT ANGIOGRAPHY HEAD AND NECK CT PERFUSION BRAIN TECHNIQUE: Multidetector CT imaging of the head and neck was performed using the standard protocol during bolus administration of intravenous contrast. Multiplanar CT image reconstructions and MIPs were obtained to evaluate the vascular anatomy. Carotid stenosis measurements (when applicable) are obtained utilizing NASCET criteria, using the distal internal carotid diameter as the denominator. Multiphase CT imaging of the brain was performed following IV bolus contrast injection. Subsequent parametric perfusion maps were calculated using RAPID software. CONTRAST:  161mL OMNIPAQUE IOHEXOL 350 MG/ML SOLN COMPARISON:  Head CT 07/07/2019 and earlier. CTA head and neck 04/09/2011. FINDINGS: CT HEAD FINDINGS Brain: Resolved postoperative pneumocephalus since last month. The left deep brain stimulator device terminates at the left thalamus as before. No acute intracranial hemorrhage identified. No midline shift, mass effect, or evidence of intracranial mass lesion. No ventriculomegaly. Evidence of a chronic right thalamic lacunar infarct is unchanged. Patchy white matter hypodensity in both hemispheres appears stable. No cortically based acute infarct identified. Vascular: Calcified atherosclerosis at the skull base. Skull: Vertex burr holes.  No acute osseous abnormality identified. Sinuses/Orbits: Visualized paranasal sinuses and mastoids are stable and well pneumatized. Other: Left vertex approach electrical lead with removed skin staples and resolved scalp gas since the postoperative CT last month. Electrode has now been tunneled across the vertex into the right scalp and neck. Stable  orbits soft tissues, with a small preseptal infraorbital calcification or retained foreign body as before. This has been present since 2012, with 2 successful brain MRI since that time. ASPECTS [facility] Stroke Program Early CT Score) Total score (0-10 with 10 being normal): 10 Review of the MIP images confirms the above findings CT Brain Perfusion Findings: CBF (<30%) Volume: 0 mL Perfusion (Tmax>6.0s) volume: 0 mL Mismatch Volume: Not applicable Infarction Location:Not applicable CTA NECK Skeleton: No acute osseous abnormality identified. Multilevel cervical spine ankylosis in part related to flowing anterior osteophytes. Upper chest: Right chest generator device now in place. Otherwise negative. Other neck: Right neck stimulator lead tracking to the vertex, no adverse features. Otherwise stable since 2012. Aortic arch: 3 vessel arch configuration. Mild arch calcified plaque. Mild arch ectasia. Right carotid system: Tortuous brachiocephalic artery and proximal right CCA without stenosis. Calcified plaque at the right ICA origin and bulb has not significantly changed since 2012, less than 50 % stenosis with respect to the distal vessel. Highly tortuous cervical right ICA without stenosis to the skull base. Left carotid system: No left CCA origin plaque or stenosis. Calcified plaque at the left ICA origin and bulb has mildly progressed since 2012. Less than 50 % stenosis with respect to the distal vessel. Highly tortuous cervical left ICA which is mildly dolichoectatic to the skull base. Vertebral arteries: Mild plaque in the proximal right subclavian artery without stenosis. Normal right  vertebral artery origin. Patent right vertebral artery to the skull base without stenosis. Right V2 tortuosity. 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 There is nearby soft and calcified plaque which appears more stable. There is no proximal left  subclavian artery stenosis. The left vertebral artery origin remains patent with mild to moderate stenosis (series 13, image 170). The left vertebral artery is tortuous, mildly dominant, and patent to the skull base without additional stenosis. Tortuous left V1 segment CTA HEAD Posterior circulation: No distal vertebral artery stenosis, the left is mildly dominant. Normal right PICA origin. Patent vertebrobasilar junction without stenosis. Patent basilar artery without stenosis. The left AICA may be dominant, unchanged. Patent SCA and PCA origins. Mild right P1 irregularity and stenosis is new on series 16, image 27. Bilateral PCA branches are otherwise stable and without significant stenosis. Anterior circulation: Both ICA siphons are patent. On the left there is mild calcified plaque without stenosis. Normal right ophthalmic artery origin. On the right there is mild to moderate calcified plaque without stenosis. Normal right ophthalmic artery origin. Posterior communicating arteries are diminutive or absent. Dominant left ACA A1 segment. MCA and left ACA origins are mildly ectatic. Diminutive right A1. Anterior communicating artery is mildly ectatic. Bilateral ACA branches are stable and within normal limits. Left MCA M1 and bifurcation are patent without stenosis. Suggestion of moderate new left MCA middle in 3 branch irregularity and stenosis since 2012. Right MCA M1 segment, bifurcation, and right MCA branches appear patent and stable since 2012. Venous sinuses: Patent. Anatomic variants: Mildly dominant left vertebral artery. Dominant left and diminutive right ACA A1 segments. Review of the MIP images confirms the above findings IMPRESSION: 1. Negative for large vessel occlusion, and no core infarct or ischemic penumbra identified by CT Perfusion. 2. Positive for a small 5 mm focus of adherent thrombus in the proximal left subclavian artery on series 10, image 169. Nearby chronic left subclavian plaque. 3. No  large vessel stenosis in the head or neck, but suggestion of Left MCA middle division M3 branch moderate irregularity and stenosis since 2012. Unclear if this might be related to a possible recent thromboembolic event implied by #2. 4. Satisfactory postoperative CT appearance of the brain following left deep brain stimulator device in August. 5. Bilateral carotid atherosclerosis with mild progression since 2012. No significant stenosis. Study discussed by telephone with Dr. Roland Rack on 08/28/2019 at 14:30 . Electronically Signed   By: Genevie Ann M.D.   On: 08/28/2019 14:31   Ct Code Stroke Cta Neck W/wo Contrast  Result Date: 08/28/2019 CLINICAL DATA:  68 year old male with slurred speech. History of deep brain stimulator. EXAM: CT ANGIOGRAPHY HEAD AND NECK CT PERFUSION BRAIN TECHNIQUE: Multidetector CT imaging of the head and neck was performed using the standard protocol during bolus administration of intravenous contrast. Multiplanar CT image reconstructions and MIPs were obtained to evaluate the vascular anatomy. Carotid stenosis measurements (when applicable) are obtained utilizing NASCET criteria, using the distal internal carotid diameter as the denominator. Multiphase CT imaging of the brain was performed following IV bolus contrast injection. Subsequent parametric perfusion maps were calculated using RAPID software. CONTRAST:  171mL OMNIPAQUE IOHEXOL 350 MG/ML SOLN COMPARISON:  Head CT 07/07/2019 and earlier. CTA head and neck 04/09/2011. FINDINGS: CT HEAD FINDINGS Brain: Resolved postoperative pneumocephalus since last month. The left deep brain stimulator device terminates at the left thalamus as before. No acute intracranial hemorrhage  identified. No midline shift, mass effect, or evidence of intracranial mass lesion. No ventriculomegaly. Evidence of a chronic right thalamic lacunar infarct is unchanged. Patchy white matter hypodensity in both hemispheres appears stable. No cortically based  acute infarct identified. Vascular: Calcified atherosclerosis at the skull base. Skull: Vertex burr holes.  No acute osseous abnormality identified. Sinuses/Orbits: Visualized paranasal sinuses and mastoids are stable and well pneumatized. Other: Left vertex approach electrical lead with removed skin staples and resolved scalp gas since the postoperative CT last month. Electrode has now been tunneled across the vertex into the right scalp and neck. Stable orbits soft tissues, with a small preseptal infraorbital calcification or retained foreign body as before. This has been present since 2012, with 2 successful brain MRI since that time. ASPECTS [facility] Stroke Program Early CT Score) Total score (0-10 with 10 being normal): 10 Review of the MIP images confirms the above findings CT Brain Perfusion Findings: CBF (<30%) Volume: 0 mL Perfusion (Tmax>6.0s) volume: 0 mL Mismatch Volume: Not applicable Infarction Location:Not applicable CTA NECK Skeleton: No acute osseous abnormality identified. Multilevel cervical spine ankylosis in part related to flowing anterior osteophytes. Upper chest: Right chest generator device now in place. Otherwise negative. Other neck: Right neck stimulator lead tracking to the vertex, no adverse features. Otherwise stable since 2012. Aortic arch: 3 vessel arch configuration. Mild arch calcified plaque. Mild arch ectasia. Right carotid system: Tortuous brachiocephalic artery and proximal right CCA without stenosis. Calcified plaque at the right ICA origin and bulb has not significantly changed since 2012, less than 50 % stenosis with respect to the distal vessel. Highly tortuous cervical right ICA without stenosis to the skull base. Left carotid system: No left CCA origin plaque or stenosis. Calcified plaque at the left ICA origin and bulb has mildly progressed since 2012. Less than 50 % stenosis with respect to the distal vessel. Highly tortuous cervical left ICA which is mildly  dolichoectatic to the skull base. Vertebral arteries: Mild plaque in the proximal right subclavian artery without stenosis. Normal right vertebral artery origin. Patent right vertebral artery to the skull base without stenosis. Right V2 tortuosity. 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 There is nearby soft and calcified plaque which appears more stable. There is no proximal left subclavian artery stenosis. The left vertebral artery origin remains patent with mild to moderate stenosis (series 13, image 170). The left vertebral artery is tortuous, mildly dominant, and patent to the skull base without additional stenosis. Tortuous left V1 segment CTA HEAD Posterior circulation: No distal vertebral artery stenosis, the left is mildly dominant. Normal right PICA origin. Patent vertebrobasilar junction without stenosis. Patent basilar artery without stenosis. The left AICA may be dominant, unchanged. Patent SCA and PCA origins. Mild right P1 irregularity and stenosis is new on series 16, image 27. Bilateral PCA branches are otherwise stable and without significant stenosis. Anterior circulation: Both ICA siphons are patent. On the left there is mild calcified plaque without stenosis. Normal right ophthalmic artery origin. On the right there is mild to moderate calcified plaque without stenosis. Normal right ophthalmic artery origin. Posterior communicating arteries are diminutive or absent. Dominant left ACA A1 segment. MCA and left ACA origins are mildly ectatic. Diminutive right A1. Anterior communicating artery is mildly ectatic. Bilateral ACA branches are stable and within normal limits. Left MCA M1 and bifurcation are patent without stenosis. Suggestion of moderate new left MCA middle in  3 branch irregularity and stenosis since 2012. Right MCA M1 segment, bifurcation, and right MCA branches appear patent and stable since 2012.  Venous sinuses: Patent. Anatomic variants: Mildly dominant left vertebral artery. Dominant left and diminutive right ACA A1 segments. Review of the MIP images confirms the above findings IMPRESSION: 1. Negative for large vessel occlusion, and no core infarct or ischemic penumbra identified by CT Perfusion. 2. Positive for a small 5 mm focus of adherent thrombus in the proximal left subclavian artery on series 10, image 169. Nearby chronic left subclavian plaque. 3. No large vessel stenosis in the head or neck, but suggestion of Left MCA middle division M3 branch moderate irregularity and stenosis since 2012. Unclear if this might be related to a possible recent thromboembolic event implied by #2. 4. Satisfactory postoperative CT appearance of the brain following left deep brain stimulator device in August. 5. Bilateral carotid atherosclerosis with mild progression since 2012. No significant stenosis. Study discussed by telephone with Dr. Roland Rack on 08/28/2019 at 14:30 . Electronically Signed   By: Genevie Ann M.D.   On: 08/28/2019 14:31   Ct Code Stroke Cta Cerebral Perfusion W/wo Contrast  Result Date: 08/28/2019 CLINICAL DATA:  68 year old male with slurred speech. History of deep brain stimulator. EXAM: CT ANGIOGRAPHY HEAD AND NECK CT PERFUSION BRAIN TECHNIQUE: Multidetector CT imaging of the head and neck was performed using the standard protocol during bolus administration of intravenous contrast. Multiplanar CT image reconstructions and MIPs were obtained to evaluate the vascular anatomy. Carotid stenosis measurements (when applicable) are obtained utilizing NASCET criteria, using the distal internal carotid diameter as the denominator. Multiphase CT imaging of the brain was performed following IV bolus contrast injection. Subsequent parametric perfusion maps were calculated using RAPID software. CONTRAST:  161mL OMNIPAQUE IOHEXOL 350 MG/ML SOLN COMPARISON:  Head CT 07/07/2019 and earlier. CTA head  and neck 04/09/2011. FINDINGS: CT HEAD FINDINGS Brain: Resolved postoperative pneumocephalus since last month. The left deep brain stimulator device terminates at the left thalamus as before. No acute intracranial hemorrhage identified. No midline shift, mass effect, or evidence of intracranial mass lesion. No ventriculomegaly. Evidence of a chronic right thalamic lacunar infarct is unchanged. Patchy white matter hypodensity in both hemispheres appears stable. No cortically based acute infarct identified. Vascular: Calcified atherosclerosis at the skull base. Skull: Vertex burr holes.  No acute osseous abnormality identified. Sinuses/Orbits: Visualized paranasal sinuses and mastoids are stable and well pneumatized. Other: Left vertex approach electrical lead with removed skin staples and resolved scalp gas since the postoperative CT last month. Electrode has now been tunneled across the vertex into the right scalp and neck. Stable orbits soft tissues, with a small preseptal infraorbital calcification or retained foreign body as before. This has been present since 2012, with 2 successful brain MRI since that time. ASPECTS [facility] Stroke Program Early CT Score) Total score (0-10 with 10 being normal): 10 Review of the MIP images confirms the above findings CT Brain Perfusion Findings: CBF (<30%) Volume: 0 mL Perfusion (Tmax>6.0s) volume: 0 mL Mismatch Volume: Not applicable Infarction Location:Not applicable CTA NECK Skeleton: No acute osseous abnormality identified. Multilevel cervical spine ankylosis in part related to flowing anterior osteophytes. Upper chest: Right chest generator device now in place. Otherwise negative. Other neck: Right neck stimulator lead tracking to the vertex, no adverse features. Otherwise stable since 2012. Aortic arch: 3 vessel arch configuration. Mild arch calcified plaque. Mild arch ectasia. Right carotid system: Tortuous brachiocephalic artery and proximal right CCA without stenosis.  Calcified  plaque at the right ICA origin and bulb has not significantly changed since 2012, less than 50 % stenosis with respect to the distal vessel. Highly tortuous cervical right ICA without stenosis to the skull base. Left carotid system: No left CCA origin plaque or stenosis. Calcified plaque at the left ICA origin and bulb has mildly progressed since 2012. Less than 50 % stenosis with respect to the distal vessel. Highly tortuous cervical left ICA which is mildly dolichoectatic to the skull base. Vertebral arteries: Mild plaque in the proximal right subclavian artery without stenosis. Normal right vertebral artery origin. Patent right vertebral artery to the skull base without stenosis. Right V2 tortuosity. 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 There is nearby soft and calcified plaque which appears more stable. There is no proximal left subclavian artery stenosis. The left vertebral artery origin remains patent with mild to moderate stenosis (series 13, image 170). The left vertebral artery is tortuous, mildly dominant, and patent to the skull base without additional stenosis. Tortuous left V1 segment CTA HEAD Posterior circulation: No distal vertebral artery stenosis, the left is mildly dominant. Normal right PICA origin. Patent vertebrobasilar junction without stenosis. Patent basilar artery without stenosis. The left AICA may be dominant, unchanged. Patent SCA and PCA origins. Mild right P1 irregularity and stenosis is new on series 16, image 27. Bilateral PCA branches are otherwise stable and without significant stenosis. Anterior circulation: Both ICA siphons are patent. On the left there is mild calcified plaque without stenosis. Normal right ophthalmic artery origin. On the right there is mild to moderate calcified plaque without stenosis. Normal right ophthalmic artery origin. Posterior communicating arteries are  diminutive or absent. Dominant left ACA A1 segment. MCA and left ACA origins are mildly ectatic. Diminutive right A1. Anterior communicating artery is mildly ectatic. Bilateral ACA branches are stable and within normal limits. Left MCA M1 and bifurcation are patent without stenosis. Suggestion of moderate new left MCA middle in 3 branch irregularity and stenosis since 2012. Right MCA M1 segment, bifurcation, and right MCA branches appear patent and stable since 2012. Venous sinuses: Patent. Anatomic variants: Mildly dominant left vertebral artery. Dominant left and diminutive right ACA A1 segments. Review of the MIP images confirms the above findings IMPRESSION: 1. Negative for large vessel occlusion, and no core infarct or ischemic penumbra identified by CT Perfusion. 2. Positive for a small 5 mm focus of adherent thrombus in the proximal left subclavian artery on series 10, image 169. Nearby chronic left subclavian plaque. 3. No large vessel stenosis in the head or neck, but suggestion of Left MCA middle division M3 branch moderate irregularity and stenosis since 2012. Unclear if this might be related to a possible recent thromboembolic event implied by #2. 4. Satisfactory postoperative CT appearance of the brain following left deep brain stimulator device in August. 5. Bilateral carotid atherosclerosis with mild progression since 2012. No significant stenosis. Study discussed by telephone with Dr. Roland Rack on 08/28/2019 at 14:30 . Electronically Signed   By: Genevie Ann M.D.   On: 08/28/2019 14:31    Procedures Procedures (including critical care time)  Medications Ordered in ED Medications  sodium chloride 0.9 % bolus 500 mL (0 mLs Intravenous Stopped 08/28/19 1414)    Followed by  0.9 %  sodium chloride infusion (100 mL/hr Intravenous New Bag/Given 08/28/19 1415)  ondansetron (ZOFRAN) injection 4 mg (4 mg Intravenous Given 08/28/19 1330)  iohexol (OMNIPAQUE) 350 MG/ML injection 100 mL (100 mLs  Intravenous Contrast Given 08/28/19 1344)     Initial Impression / Assessment and Plan / ED Course  I have reviewed the triage vital signs and the nursing notes.  Pertinent labs & imaging results that were available during my care of the patient were reviewed by me and considered in my medical decision making (see chart for details).    Immediately after the initial evaluation with consideration of stroke, though the patient is out of the window for TPA discussed this case with our neurologist to discuss appropriate imaging modality. Patient is not a candidate for MRI given his recent brain implantable device procedure.     3:20 PM CT findings concerning for thrombus in the left subclavian vein, consideration of embolic stroke.  I discussed the findings with our neurology colleague again, Dr. Leonel Ramsay.  This 7-year-old male presents with aphasia since yesterday. Patient's neurologic exam is otherwise reassuring. He does have baseline tremor, this is unchanged. With CT evidence concerning for thrombus in the subclavian vein, consideration of a stroke, patient require admission for further monitoring, management. Have discussed patient's case with our internal medicine colleagues, and the patient is appropriate for admission.  Final Clinical Impressions(s) / ED Diagnoses   Final diagnoses:  Aphasia     Carmin Muskrat, MD 08/28/19 1555

## 2019-08-28 NOTE — ED Notes (Signed)
IV Team Called to place new IV, Informed IV team will get labs.

## 2019-08-28 NOTE — ED Notes (Signed)
Vascular Surgeon at bedside for evaluation.

## 2019-08-28 NOTE — ED Notes (Signed)
Neuro PA at bedside for evaluation.

## 2019-08-28 NOTE — ED Notes (Signed)
Patient transported to CT 

## 2019-08-28 NOTE — Consult Note (Addendum)
NEURO HOSPITALIST  CONSULT   Requesting Physician: Dr. Vanita Panda    Chief Complaint: slurred speech  History obtained from:  Patient  HPI:                                                                                                                                         Joshua [name] is an 68 y.o. male with PMH significant for  Essential tremor ( with stimulator) OSA ( CPAP), HTN,HLD, DM, CVA ( left retina 10 years ago, no central vision)    Patient stated that last night before bed about 0930 he noticed that his tongue felt slow. He also stated that he had problems swallowing water. He would choke. Stated that it feels like some water stays on the back of his throat and then he inhales some. He did not tell his wife and he went to sleep.   When he woke up this morning he felt that he was still talking slowly, his tongue was still slow, and he was tired.  when he also became nauseated he decided to call his physician who recommended coming to the ED. Wife denies any facial droop. Endorses taking daily ASA which he did not take last Tuesday/wednesday and Thursday for foot surgery on Thursday.  He denies any CP, SOB, Vision changes, dizziness, numbness, tingling or any weakness, drug abuse, smoking, ETOH use. BUE tremor is present mostly noticeable with action, he has turned off his stimulator for the moment.   ED course:  CTA/P:no LVO, no core infarct. 29mm thrombus left subclavian artery. BP: 138/94 BG: 198 Bun: 31 creatinine: 1.40     Date last known well: 08/27/2019 Time last known well: 2130 tPA Given: no; outside of window       Modified Rankin: Rankin Score=1 NIHSS:2; dysarthria, ataxia   Past Medical History:  Diagnosis Date  . Benign essential tremor   . Benign positional vertigo   . CVA (cerebral vascular accident) ([place])    x2 - L retina, 1 right parietal  . Degenerative arthritis   . Depression   . Diabetes mellitus   .  Dyslipidemia   . GERD (gastroesophageal reflux disease)    hiatal hernia  . Gout   . H/O: vasectomy   . Hearing aid worn    b/l  . Hx of appendectomy   . Hx of tonsillectomy   . Hypertension   . 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 Melanoma ([place])   . Obesity   . OSA on CPAP    setting = 5  . Tremor, essential 06/22/2017  . Wears glasses  Past Surgical History:  Procedure Laterality Date  . APPENDECTOMY    . arthroscopic knee surgery Bilateral   . CATARACT EXTRACTION Bilateral   . COLONOSCOPY    . MINOR PLACEMENT OF FIDUCIAL N/A 06/30/2019   Procedure: Fiducial placement;  Surgeon: Erline Levine, MD;  Location: [facility];  Service: Neurosurgery;  Laterality: N/A;  Fiducial placement  . NASAL SEPTUM SURGERY    . PULSE GENERATOR IMPLANT N/A 07/14/2019   Procedure: Left cranial Implanted Pulse Generator and lead extension placement to right chest ;  Surgeon: Erline Levine, MD;  Location: [facility];  Service: Neurosurgery;  Laterality: N/A;  . SUBTHALAMIC STIMULATOR INSERTION Left 07/07/2019   Procedure: LEFT DEEP BRAIN STIMULATOR PLACEMENT;  Surgeon: Erline Levine, MD;  Location: [facility];  Service: Neurosurgery;  Laterality: Left;  . TONSILLECTOMY    . TOTAL KNEE ARTHROPLASTY Left 03/30/2017   Procedure: LEFT TOTAL KNEE ARTHROPLASTY;  Surgeon: Paralee Cancel, MD;  Location: [facility];  Service: Orthopedics;  Laterality: Left;  . WISDOM TOOTH EXTRACTION      Family History  Problem Relation Age of Onset  . Cerebral aneurysm Mother   . Tremor Mother   . Alzheimer's disease Father   . Tremor Brother   . Tremor Maternal Uncle   . Diabetes Maternal Grandmother   . Healthy Son   . Colon cancer Neg Hx        Social History:  reports that he quit smoking about 35 years ago. His smoking use included cigarettes. He has a 10.00 pack-year smoking history. He has never used smokeless tobacco. He reports current alcohol use of about 2.0 standard drinks of alcohol per week. He reports that he  does not use drugs.  Allergies: No Known Allergies  Medications:                                                                                                                           Current Facility-Administered Medications  Medication Dose Route Frequency Provider Last Rate Last Dose  . 0.9 %  sodium chloride infusion  100 mL/hr Intravenous Continuous Carmin Muskrat, MD      . iohexol (OMNIPAQUE) 350 MG/ML injection 100 mL  100 mL Intravenous Once PRN Carmin Muskrat, MD       Current Outpatient Medications  Medication Sig Dispense Refill  . amLODipine (NORVASC) 5 MG tablet Take 5 mg by mouth every evening.     Marland Kitchen aspirin 81 MG tablet Take 1 tablet (81 mg total) by mouth daily. 30 tablet 0  . atorvastatin (LIPITOR) 40 MG tablet Take 40 mg by mouth every evening.     . cephALEXin (KEFLEX) 500 MG capsule Take 1 capsule (500 mg total) by mouth 3 (three) times daily. 30 capsule 0  . cephALEXin (KEFLEX) 500 MG capsule Take 1 capsule (500 mg total) by mouth 3 (three) times daily. 30 capsule 0  . DULoxetine (CYMBALTA) 60 MG capsule Take 60 mg by mouth daily.    Marland Kitchen  fenofibrate 54 MG tablet Take 54 mg by mouth daily.    Marland Kitchen HUMALOG KWIKPEN 100 UNIT/ML KwikPen Inject 25 Units into the skin 3 (three) times daily with meals.     . hydrochlorothiazide (HYDRODIURIL) 25 MG tablet Take 25 mg by mouth daily.    Marland Kitchen HYDROcodone-acetaminophen (NORCO/VICODIN) 5-325 MG tablet Take 1-2 tablets by mouth every 4 (four) hours as needed for severe pain ((score 7 to 10)). 30 tablet 0  . HYDROcodone-acetaminophen (NORCO/VICODIN) 5-325 MG tablet Take 1 tablet by mouth every 4 (four) hours as needed. 20 tablet 0  . losartan (COZAAR) 100 MG tablet Take 100 mg by mouth daily.    . Lutein-Zeaxanthin 25-5 MG CAPS Take 1 tablet by mouth daily.    . metFORMIN (GLUCOPHAGE) 1000 MG tablet Take 1,000 mg by mouth 2 (two) times daily with a meal.    . Multiple Vitamin (MULTIVITAMIN WITH MINERALS) TABS tablet Take 1 tablet by  mouth daily.    . mupirocin ointment (BACTROBAN) 2 % Apply 1 application topically 2 (two) times daily. 30 g 2  . Omega-3 Fatty Acids (FISH OIL) 1000 MG CAPS Take 2,000 mg by mouth daily. Reported on 02/13/2016    . ONETOUCH VERIO test strip     . Polyethyl Glycol-Propyl Glycol (SYSTANE OP) Place 1-2 drops into both eyes 4 (four) times daily as needed (dry eyes).     . promethazine (PHENERGAN) 25 MG tablet Take 1 tablet (25 mg total) by mouth every 8 (eight) hours as needed for nausea or vomiting. 20 tablet 0  . TOUJEO SOLOSTAR 300 UNIT/ML SOPN Inject 80 Units into the skin 2 (two) times daily. Breakfast and bedtime       ROS:                                                                                                                                       ROS was performed and is negative except as noted in HPI    General Examination:                                                                                                      Blood pressure (!) 156/88, pulse 86, temperature 98.6 F (37 C), temperature source Oral, resp. rate 13, height 6\' 4"  (1.93 m), weight 119.7 kg, SpO2 95 %.  HEENT-  Normocephalic, no lesions, without obvious abnormality.  Normal external eye and conjunctiva. Cardiovascular-  pulses palpable throughout  Lungs-, no excessive working breathing.  Saturations  within normal limits on RA Extremities- Warm, dry and intact Musculoskeletal- right foot is swollen and has incision with sutures from recent foot surgery Skin-warm and dry, no hyperpigmentation, vitiligo, or suspicious lesions  Neurological Examination Mental Status: Alert, oriented, thought content appropriate.  Speech fluent without evidence of aphasia.  Able to follow commands without difficulty. Cranial Nerves: II:  Visual fields grossly normal in right eye. Peripheral vision intact bilaterally. No central vision in left eye from previous CVA. III,IV, VI: ptosis not present, extra-ocular motions  intact bilaterally, pupils equal, round, reactive to light and accommodation V,VII: smile symmetric, facial light touch sensation normal bilaterally VIII: hearing normal bilaterally IX,X: uvula rises midline XI: bilateral shoulder shrug XII: midline tongue extension Motor: Right : Upper extremity   5/5  Left:     Upper extremity   5/5  Lower extremity   5/5   Lower extremity   5/5 Tone and bulk:normal tone throughout; no atrophy noted Sensory: light touch intact throughout, bilaterally Deep Tendon Reflexes: 2+ and symmetric biceps and patella Plantars: Right: downgoing   Left: downgoing Cerebellar: normal finger-to-nose, ataxia in LLE Gait: deferred   Lab Results: Basic Metabolic Panel: Recent Labs  Lab 08/28/19 1316  NA 141  K 4.2  CL 105  GLUCOSE 198*  BUN 31*  CREATININE 1.40*    CBC: Recent Labs  Lab 08/28/19 1300 08/28/19 1316  WBC 8.1  --   NEUTROABS 4.1  --   HGB 12.9* 13.3  HCT 38.8* 39.0  MCV 89.6  --   PLT 281  --      CBG: Recent Labs  Lab 08/28/19 1300  GLUCAP 181*    Imaging: Ct Code Stroke Cta Head W/wo Contrast  Result Date: 08/28/2019 CLINICAL DATA:  68 year old male with slurred speech. History of deep brain stimulator. EXAM: CT ANGIOGRAPHY HEAD AND NECK CT PERFUSION BRAIN TECHNIQUE: Multidetector CT imaging of the head and neck was performed using the standard protocol during bolus administration of intravenous contrast. Multiplanar CT image reconstructions and MIPs were obtained to evaluate the vascular anatomy. Carotid stenosis measurements (when applicable) are obtained utilizing NASCET criteria, using the distal internal carotid diameter as the denominator. Multiphase CT imaging of the brain was performed following IV bolus contrast injection. Subsequent parametric perfusion maps were calculated using RAPID software. CONTRAST:  14mL OMNIPAQUE IOHEXOL 350 MG/ML SOLN COMPARISON:  Head CT 07/07/2019 and earlier. CTA head and neck 04/09/2011.  FINDINGS: CT HEAD FINDINGS Brain: Resolved postoperative pneumocephalus since last month. The left deep brain stimulator device terminates at the left thalamus as before. No acute intracranial hemorrhage identified. No midline shift, mass effect, or evidence of intracranial mass lesion. No ventriculomegaly. Evidence of a chronic right thalamic lacunar infarct is unchanged. Patchy white matter hypodensity in both hemispheres appears stable. No cortically based acute infarct identified. Vascular: Calcified atherosclerosis at the skull base. Skull: Vertex burr holes.  No acute osseous abnormality identified. Sinuses/Orbits: Visualized paranasal sinuses and mastoids are stable and well pneumatized. Other: Left vertex approach electrical lead with removed skin staples and resolved scalp gas since the postoperative CT last month. Electrode has now been tunneled across the vertex into the right scalp and neck. Stable orbits soft tissues, with a small preseptal infraorbital calcification or retained foreign body as before. This has been present since 2012, with 2 successful brain MRI since that time. ASPECTS [facility] Stroke Program Early CT Score) Total score (0-10 with 10 being normal): 10 Review of the MIP images confirms the above findings CT  Brain Perfusion Findings: CBF (<30%) Volume: 0 mL Perfusion (Tmax>6.0s) volume: 0 mL Mismatch Volume: Not applicable Infarction Location:Not applicable CTA NECK Skeleton: No acute osseous abnormality identified. Multilevel cervical spine ankylosis in part related to flowing anterior osteophytes. Upper chest: Right chest generator device now in place. Otherwise negative. Other neck: Right neck stimulator lead tracking to the vertex, no adverse features. Otherwise stable since 2012. Aortic arch: 3 vessel arch configuration. Mild arch calcified plaque. Mild arch ectasia. Right carotid system: Tortuous brachiocephalic artery and proximal right CCA without stenosis. Calcified plaque at  the right ICA origin and bulb has not significantly changed since 2012, less than 50 % stenosis with respect to the distal vessel. Highly tortuous cervical right ICA without stenosis to the skull base. Left carotid system: No left CCA origin plaque or stenosis. Calcified plaque at the left ICA origin and bulb has mildly progressed since 2012. Less than 50 % stenosis with respect to the distal vessel. Highly tortuous cervical left ICA which is mildly dolichoectatic to the skull base. Vertebral arteries: Mild plaque in the proximal right subclavian artery without stenosis. Normal right vertebral artery origin. Patent right vertebral artery to the skull base without stenosis. Right V2 tortuosity. 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 There is nearby soft and calcified plaque which appears more stable. There is no proximal left subclavian artery stenosis. The left vertebral artery origin remains patent with mild to moderate stenosis (series 13, image 170). The left vertebral artery is tortuous, mildly dominant, and patent to the skull base without additional stenosis. Tortuous left V1 segment CTA HEAD Posterior circulation: No distal vertebral artery stenosis, the left is mildly dominant. Normal right PICA origin. Patent vertebrobasilar junction without stenosis. Patent basilar artery without stenosis. The left AICA may be dominant, unchanged. Patent SCA and PCA origins. Mild right P1 irregularity and stenosis is new on series 16, image 27. Bilateral PCA branches are otherwise stable and without significant stenosis. Anterior circulation: Both ICA siphons are patent. On the left there is mild calcified plaque without stenosis. Normal right ophthalmic artery origin. On the right there is mild to moderate calcified plaque without stenosis. Normal right ophthalmic artery origin. Posterior communicating arteries are diminutive or absent.  Dominant left ACA A1 segment. MCA and left ACA origins are mildly ectatic. Diminutive right A1. Anterior communicating artery is mildly ectatic. Bilateral ACA branches are stable and within normal limits. Left MCA M1 and bifurcation are patent without stenosis. Suggestion of moderate new left MCA middle in 3 branch irregularity and stenosis since 2012. Right MCA M1 segment, bifurcation, and right MCA branches appear patent and stable since 2012. Venous sinuses: Patent. Anatomic variants: Mildly dominant left vertebral artery. Dominant left and diminutive right ACA A1 segments. Review of the MIP images confirms the above findings IMPRESSION: 1. Negative for large vessel occlusion, and no core infarct or ischemic penumbra identified by CT Perfusion. 2. Positive for a small 5 mm focus of adherent thrombus in the proximal left subclavian artery on series 10, image 169. Nearby chronic left subclavian plaque. 3. No large vessel stenosis in the head or neck, but suggestion of Left MCA middle division M3 branch moderate irregularity and stenosis since 2012. Unclear if this might be related to a possible recent thromboembolic event implied by #2. 4. Satisfactory postoperative CT appearance of the brain following left deep brain stimulator device in August. 5. Bilateral carotid atherosclerosis with mild  progression since 2012. No significant stenosis. Study discussed by telephone with Dr. Roland Rack on 08/28/2019 at 14:30 . Electronically Signed   By: Genevie Ann M.D.   On: 08/28/2019 14:31   Ct Code Stroke Cta Neck W/wo Contrast  Result Date: 08/28/2019 CLINICAL DATA:  68 year old male with slurred speech. History of deep brain stimulator. EXAM: CT ANGIOGRAPHY HEAD AND NECK CT PERFUSION BRAIN TECHNIQUE: Multidetector CT imaging of the head and neck was performed using the standard protocol during bolus administration of intravenous contrast. Multiplanar CT image reconstructions and MIPs were obtained to evaluate the  vascular anatomy. Carotid stenosis measurements (when applicable) are obtained utilizing NASCET criteria, using the distal internal carotid diameter as the denominator. Multiphase CT imaging of the brain was performed following IV bolus contrast injection. Subsequent parametric perfusion maps were calculated using RAPID software. CONTRAST:  132mL OMNIPAQUE IOHEXOL 350 MG/ML SOLN COMPARISON:  Head CT 07/07/2019 and earlier. CTA head and neck 04/09/2011. FINDINGS: CT HEAD FINDINGS Brain: Resolved postoperative pneumocephalus since last month. The left deep brain stimulator device terminates at the left thalamus as before. No acute intracranial hemorrhage identified. No midline shift, mass effect, or evidence of intracranial mass lesion. No ventriculomegaly. Evidence of a chronic right thalamic lacunar infarct is unchanged. Patchy white matter hypodensity in both hemispheres appears stable. No cortically based acute infarct identified. Vascular: Calcified atherosclerosis at the skull base. Skull: Vertex burr holes.  No acute osseous abnormality identified. Sinuses/Orbits: Visualized paranasal sinuses and mastoids are stable and well pneumatized. Other: Left vertex approach electrical lead with removed skin staples and resolved scalp gas since the postoperative CT last month. Electrode has now been tunneled across the vertex into the right scalp and neck. Stable orbits soft tissues, with a small preseptal infraorbital calcification or retained foreign body as before. This has been present since 2012, with 2 successful brain MRI since that time. ASPECTS [facility] Stroke Program Early CT Score) Total score (0-10 with 10 being normal): 10 Review of the MIP images confirms the above findings CT Brain Perfusion Findings: CBF (<30%) Volume: 0 mL Perfusion (Tmax>6.0s) volume: 0 mL Mismatch Volume: Not applicable Infarction Location:Not applicable CTA NECK Skeleton: No acute osseous abnormality identified. Multilevel cervical  spine ankylosis in part related to flowing anterior osteophytes. Upper chest: Right chest generator device now in place. Otherwise negative. Other neck: Right neck stimulator lead tracking to the vertex, no adverse features. Otherwise stable since 2012. Aortic arch: 3 vessel arch configuration. Mild arch calcified plaque. Mild arch ectasia. Right carotid system: Tortuous brachiocephalic artery and proximal right CCA without stenosis. Calcified plaque at the right ICA origin and bulb has not significantly changed since 2012, less than 50 % stenosis with respect to the distal vessel. Highly tortuous cervical right ICA without stenosis to the skull base. Left carotid system: No left CCA origin plaque or stenosis. Calcified plaque at the left ICA origin and bulb has mildly progressed since 2012. Less than 50 % stenosis with respect to the distal vessel. Highly tortuous cervical left ICA which is mildly dolichoectatic to the skull base. Vertebral arteries: Mild plaque in the proximal right subclavian artery without stenosis. Normal right vertebral artery origin. Patent right vertebral artery to the skull base without stenosis. Right V2 tortuosity. 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 There is nearby soft and calcified plaque which appears more stable. There is no proximal left subclavian artery stenosis. The left vertebral  artery origin remains patent with mild to moderate stenosis (series 13, image 170). The left vertebral artery is tortuous, mildly dominant, and patent to the skull base without additional stenosis. Tortuous left V1 segment CTA HEAD Posterior circulation: No distal vertebral artery stenosis, the left is mildly dominant. Normal right PICA origin. Patent vertebrobasilar junction without stenosis. Patent basilar artery without stenosis. The left AICA may be dominant, unchanged. Patent SCA and PCA origins. Mild right P1  irregularity and stenosis is new on series 16, image 27. Bilateral PCA branches are otherwise stable and without significant stenosis. Anterior circulation: Both ICA siphons are patent. On the left there is mild calcified plaque without stenosis. Normal right ophthalmic artery origin. On the right there is mild to moderate calcified plaque without stenosis. Normal right ophthalmic artery origin. Posterior communicating arteries are diminutive or absent. Dominant left ACA A1 segment. MCA and left ACA origins are mildly ectatic. Diminutive right A1. Anterior communicating artery is mildly ectatic. Bilateral ACA branches are stable and within normal limits. Left MCA M1 and bifurcation are patent without stenosis. Suggestion of moderate new left MCA middle in 3 branch irregularity and stenosis since 2012. Right MCA M1 segment, bifurcation, and right MCA branches appear patent and stable since 2012. Venous sinuses: Patent. Anatomic variants: Mildly dominant left vertebral artery. Dominant left and diminutive right ACA A1 segments. Review of the MIP images confirms the above findings IMPRESSION: 1. Negative for large vessel occlusion, and no core infarct or ischemic penumbra identified by CT Perfusion. 2. Positive for a small 5 mm focus of adherent thrombus in the proximal left subclavian artery on series 10, image 169. Nearby chronic left subclavian plaque. 3. No large vessel stenosis in the head or neck, but suggestion of Left MCA middle division M3 branch moderate irregularity and stenosis since 2012. Unclear if this might be related to a possible recent thromboembolic event implied by #2. 4. Satisfactory postoperative CT appearance of the brain following left deep brain stimulator device in August. 5. Bilateral carotid atherosclerosis with mild progression since 2012. No significant stenosis. Study discussed by telephone with Dr. Roland Rack on 08/28/2019 at 14:30 . Electronically Signed   By: Genevie Ann M.D.    On: 08/28/2019 14:31   Ct Code Stroke Cta Cerebral Perfusion W/wo Contrast  Result Date: 08/28/2019 CLINICAL DATA:  68 year old male with slurred speech. History of deep brain stimulator. EXAM: CT ANGIOGRAPHY HEAD AND NECK CT PERFUSION BRAIN TECHNIQUE: Multidetector CT imaging of the head and neck was performed using the standard protocol during bolus administration of intravenous contrast. Multiplanar CT image reconstructions and MIPs were obtained to evaluate the vascular anatomy. Carotid stenosis measurements (when applicable) are obtained utilizing NASCET criteria, using the distal internal carotid diameter as the denominator. Multiphase CT imaging of the brain was performed following IV bolus contrast injection. Subsequent parametric perfusion maps were calculated using RAPID software. CONTRAST:  163mL OMNIPAQUE IOHEXOL 350 MG/ML SOLN COMPARISON:  Head CT 07/07/2019 and earlier. CTA head and neck 04/09/2011. FINDINGS: CT HEAD FINDINGS Brain: Resolved postoperative pneumocephalus since last month. The left deep brain stimulator device terminates at the left thalamus as before. No acute intracranial hemorrhage identified. No midline shift, mass effect, or evidence of intracranial mass lesion. No ventriculomegaly. Evidence of a chronic right thalamic lacunar infarct is unchanged. Patchy white matter hypodensity in both hemispheres appears stable. No cortically based acute infarct identified. Vascular: Calcified atherosclerosis at the skull base. Skull: Vertex burr holes.  No acute osseous abnormality identified. Sinuses/Orbits: Visualized paranasal  sinuses and mastoids are stable and well pneumatized. Other: Left vertex approach electrical lead with removed skin staples and resolved scalp gas since the postoperative CT last month. Electrode has now been tunneled across the vertex into the right scalp and neck. Stable orbits soft tissues, with a small preseptal infraorbital calcification or retained foreign  body as before. This has been present since 2012, with 2 successful brain MRI since that time. ASPECTS [facility] Stroke Program Early CT Score) Total score (0-10 with 10 being normal): 10 Review of the MIP images confirms the above findings CT Brain Perfusion Findings: CBF (<30%) Volume: 0 mL Perfusion (Tmax>6.0s) volume: 0 mL Mismatch Volume: Not applicable Infarction Location:Not applicable CTA NECK Skeleton: No acute osseous abnormality identified. Multilevel cervical spine ankylosis in part related to flowing anterior osteophytes. Upper chest: Right chest generator device now in place. Otherwise negative. Other neck: Right neck stimulator lead tracking to the vertex, no adverse features. Otherwise stable since 2012. Aortic arch: 3 vessel arch configuration. Mild arch calcified plaque. Mild arch ectasia. Right carotid system: Tortuous brachiocephalic artery and proximal right CCA without stenosis. Calcified plaque at the right ICA origin and bulb has not significantly changed since 2012, less than 50 % stenosis with respect to the distal vessel. Highly tortuous cervical right ICA without stenosis to the skull base. Left carotid system: No left CCA origin plaque or stenosis. Calcified plaque at the left ICA origin and bulb has mildly progressed since 2012. Less than 50 % stenosis with respect to the distal vessel. Highly tortuous cervical left ICA which is mildly dolichoectatic to the skull base. Vertebral arteries: Mild plaque in the proximal right subclavian artery without stenosis. Normal right vertebral artery origin. Patent right vertebral artery to the skull base without stenosis. Right V2 tortuosity. 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 There is nearby soft and calcified plaque which appears more stable. There is no proximal left subclavian artery stenosis. The left vertebral artery origin remains patent with mild to  moderate stenosis (series 13, image 170). The left vertebral artery is tortuous, mildly dominant, and patent to the skull base without additional stenosis. Tortuous left V1 segment CTA HEAD Posterior circulation: No distal vertebral artery stenosis, the left is mildly dominant. Normal right PICA origin. Patent vertebrobasilar junction without stenosis. Patent basilar artery without stenosis. The left AICA may be dominant, unchanged. Patent SCA and PCA origins. Mild right P1 irregularity and stenosis is new on series 16, image 27. Bilateral PCA branches are otherwise stable and without significant stenosis. Anterior circulation: Both ICA siphons are patent. On the left there is mild calcified plaque without stenosis. Normal right ophthalmic artery origin. On the right there is mild to moderate calcified plaque without stenosis. Normal right ophthalmic artery origin. Posterior communicating arteries are diminutive or absent. Dominant left ACA A1 segment. MCA and left ACA origins are mildly ectatic. Diminutive right A1. Anterior communicating artery is mildly ectatic. Bilateral ACA branches are stable and within normal limits. Left MCA M1 and bifurcation are patent without stenosis. Suggestion of moderate new left MCA middle in 3 branch irregularity and stenosis since 2012. Right MCA M1 segment, bifurcation, and right MCA branches appear patent and stable since 2012. Venous sinuses: Patent. Anatomic variants: Mildly dominant left vertebral artery. Dominant left and diminutive right ACA A1 segments. Review of the MIP images confirms the above findings IMPRESSION: 1. Negative for large vessel occlusion, and no core  infarct or ischemic penumbra identified by CT Perfusion. 2. Positive for a small 5 mm focus of adherent thrombus in the proximal left subclavian artery on series 10, image 169. Nearby chronic left subclavian plaque. 3. No large vessel stenosis in the head or neck, but suggestion of Left MCA middle division M3  branch moderate irregularity and stenosis since 2012. Unclear if this might be related to a possible recent thromboembolic event implied by #2. 4. Satisfactory postoperative CT appearance of the brain following left deep brain stimulator device in August. 5. Bilateral carotid atherosclerosis with mild progression since 2012. No significant stenosis. Study discussed by telephone with Dr. Roland Rack on 08/28/2019 at 14:30 . Electronically Signed   By: Genevie Ann M.D.   On: 08/28/2019 14:31       Laurey Morale, MSN, NP-C [facility] [phone]  08/28/2019, 1:02 PM   Attending physician note to follow with Assessment and plan .  I have seen the patient and reviewed the above note.  On my exam, he actually has some mild difficulty with rapid repetitive movements with the left hand and with heel-knee-shin on the left.  He has saccadic smooth pursuit as well as overshoot on saccadic eye movements.  He has subtle left beating nystagmus on leftward gaze and rotary nystagmus on upward gaze.  Assessment: 68 y.o. male with a history of hypertension, hyperlipidemia, diabetes who presents with dysarthria and very mild left arm >left leg ataxia.  I suspect that he has had a small left cerebellar stroke, though the clinical findings are subtle.  He has intraluminal thrombus in the subclavian artery.  I discussed this with vascular surgery who will consult.  Think we are going to treat this with anticoagulation at this time.  Stroke Risk Factors - diabetes mellitus, hyperlipidemia and hypertension  Recommendations: -- BP goal : Permissive HTN upto 220/110 mmHg   --MRI Brain  --CTA/P --Echocardiogram --Stroke protocol heparin -- High intensity Statin if LDL > 70 -- HgbA1c, fasting lipid panel -- PT consult, OT consult, Speech consult --Telemetry monitoring --Frequent neuro checks --NPO until passes Stroke swallow screen   Roland Rack, MD [facility] [phone]  If 7pm- 7am, please page neurology on call as listed in [place].

## 2019-08-28 NOTE — ED Triage Notes (Signed)
Pt present with [facility] for weakness and slurred speech and speech is slow to respond.  Hx of CVS.  Pt has deep brain stimulant device for tremor.  No falls.  Denies blood thinners.  Equal grasps. Pt reports his tongue feels "heavier," and hard to swallow.  [place] is approximately 2100 08/27/19.

## 2019-08-28 NOTE — ED Notes (Addendum)
Neurologist at bedside for evaluation 

## 2019-08-28 NOTE — Progress Notes (Signed)
RT set up CPAP and placed on patient. Patient is tolerating settings well at this time. No respiratory distress noted. RT will monitor as needed.

## 2019-08-28 NOTE — Progress Notes (Signed)
ANTICOAGULATION CONSULT NOTE - Initial Consult  Pharmacy Consult for heparin Indication: stroke   Patient Measurements: Height: 6\' 4"  (193 cm) Weight: 264 lb (119.7 kg) IBW/kg (Calculated) : 86.8 Heparin Dosing Weight: 111.9 kg  Vital Signs: Temp: 98.6 F (37 C) (10/04 1246) Temp Source: Oral (10/04 1246) BP: 170/96 (10/04 1430) Pulse Rate: 85 (10/04 1415)  Labs: Recent Labs    08/28/19 1300 08/28/19 1316  HGB 12.9* 13.3  HCT 38.8* 39.0  PLT 281  --   APTT 34  --   LABPROT 13.9  --   INR 1.1  --   CREATININE 1.51* 1.40*      Medical History: Past Medical History:  Diagnosis Date  . Benign essential tremor   . Benign positional vertigo   . CVA (cerebral vascular accident) ([place])    x2 - L retina, 1 right parietal  . Degenerative arthritis   . Depression   . Diabetes mellitus   . Dyslipidemia   . GERD (gastroesophageal reflux disease)    hiatal hernia  . Gout   . H/O: vasectomy   . Hearing aid worn    b/l  . Hx of appendectomy   . Hx of tonsillectomy   . Hypertension   . 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 Melanoma ([place])   . Obesity   . OSA on CPAP    setting = 5  . Tremor, essential 06/22/2017  . Wears glasses      Assessment:  68 year old male admitted with aphasia. He did not receive tPA as he was outside the window. Planning to start heparin while undergoing workup. He is not on anticoagulation prior to admission. H/h plts are stable, SCr 1.4.   Goal of Therapy:  Heparin level 0.3-0.7 units/ml Monitor platelets by anticoagulation protocol: Yes    Plan:  -Heparin bolus 4000 units x1 then 1500 units/hr -Daily HL, CBC -Check level this evening   Harvel Quale 08/28/2019,4:16 PM

## 2019-08-28 NOTE — ED Notes (Signed)
Pt placed in hospital bed for comfort.

## 2019-08-28 NOTE — ED Notes (Signed)
Pt's CBG result was 181. Informed Caryl Pina - RN.

## 2019-08-29 ENCOUNTER — Inpatient Hospital Stay (HOSPITAL_COMMUNITY): Payer: Medicare Other

## 2019-08-29 ENCOUNTER — Telehealth: Payer: Self-pay | Admitting: Podiatry

## 2019-08-29 DIAGNOSIS — I6340 Cerebral infarction due to embolism of unspecified cerebral artery: Secondary | ICD-10-CM

## 2019-08-29 DIAGNOSIS — I6389 Other cerebral infarction: Secondary | ICD-10-CM

## 2019-08-29 DIAGNOSIS — E669 Obesity, unspecified: Secondary | ICD-10-CM

## 2019-08-29 DIAGNOSIS — N1831 Chronic kidney disease, stage 3a: Secondary | ICD-10-CM

## 2019-08-29 DIAGNOSIS — I63119 Cerebral infarction due to embolism of unspecified vertebral artery: Secondary | ICD-10-CM

## 2019-08-29 DIAGNOSIS — E785 Hyperlipidemia, unspecified: Secondary | ICD-10-CM

## 2019-08-29 DIAGNOSIS — G250 Essential tremor: Secondary | ICD-10-CM

## 2019-08-29 DIAGNOSIS — Z9989 Dependence on other enabling machines and devices: Secondary | ICD-10-CM

## 2019-08-29 DIAGNOSIS — G4733 Obstructive sleep apnea (adult) (pediatric): Secondary | ICD-10-CM

## 2019-08-29 HISTORY — DX: Cerebral infarction due to embolism of unspecified cerebral artery: I63.40

## 2019-08-29 LAB — HEPARIN LEVEL (UNFRACTIONATED)
Heparin Unfractionated: <0.1 IU/mL — ABNORMAL LOW (ref 0.30–0.70)
Heparin Unfractionated: <0.1 IU/mL — ABNORMAL LOW (ref 0.30–0.70)
Heparin Unfractionated: 0.3 IU/mL (ref 0.30–0.70)

## 2019-08-29 LAB — CBC
HCT: 39.5 % (ref 39.0–52.0)
Hemoglobin: 13.2 g/dL (ref 13.0–17.0)
MCH: 30.5 pg (ref 26.0–34.0)
MCHC: 33.4 g/dL (ref 30.0–36.0)
MCV: 91.2 fL (ref 80.0–100.0)
Platelets: 222 10*3/uL (ref 150–400)
RBC: 4.33 MIL/uL (ref 4.22–5.81)
RDW: 13 % (ref 11.5–15.5)
WBC: 8.1 10*3/uL (ref 4.0–10.5)
nRBC: 0 % (ref 0.0–0.2)

## 2019-08-29 LAB — HEMOGLOBIN A1C
Hgb A1c MFr Bld: 7.8 % — ABNORMAL HIGH (ref 4.8–5.6)
Mean Plasma Glucose: 177.16 mg/dL

## 2019-08-29 LAB — CBG MONITORING, ED: Glucose-Capillary: 92 mg/dL (ref 70–99)

## 2019-08-29 LAB — GLUCOSE, CAPILLARY
Glucose-Capillary: 147 mg/dL — ABNORMAL HIGH (ref 70–99)
Glucose-Capillary: 197 mg/dL — ABNORMAL HIGH (ref 70–99)

## 2019-08-29 LAB — ECHOCARDIOGRAM COMPLETE
Height: 76 in
Weight: 4224 oz

## 2019-08-29 LAB — LIPID PANEL
Cholesterol: 183 mg/dL (ref 0–200)
HDL: 26 mg/dL — ABNORMAL LOW (ref >40)
LDL Cholesterol: 107 mg/dL — ABNORMAL HIGH (ref 0–99)
Total CHOL/HDL Ratio: 7 RATIO
Triglycerides: 249 mg/dL — ABNORMAL HIGH (ref <150)
VLDL: 50 mg/dL — ABNORMAL HIGH (ref 0–40)

## 2019-08-29 MED ORDER — PERFLUTREN LIPID MICROSPHERE
1.0000 mL | INTRAVENOUS | Status: AC | PRN
  Administered 2019-08-29: 5 mL via INTRAVENOUS
  Filled 2019-08-29: qty 10

## 2019-08-29 MED ORDER — HEPARIN (PORCINE) 25000 UT/250ML-% IV SOLN
2150.0000 [IU]/h | INTRAVENOUS | Status: DC
  Administered 2019-08-29 (×2): 2100 [IU]/h via INTRAVENOUS
  Administered 2019-08-29: 1800 [IU]/h via INTRAVENOUS
  Administered 2019-08-30: 2150 [IU]/h via INTRAVENOUS
  Filled 2019-08-29 (×4): qty 250

## 2019-08-29 NOTE — Progress Notes (Addendum)
ANTICOAGULATION CONSULT NOTE   Pharmacy Consult for heparin Indication: stroke   Patient Measurements: Height: 6\' 4"  (193 cm) Weight: 264 lb (119.7 kg) IBW/kg (Calculated) : 86.8 Heparin Dosing Weight: 111.9 kg  Vital Signs: BP: 139/87 (10/05 0400) Pulse Rate: 76 (10/05 0400)  Labs: Recent Labs    08/28/19 1300 08/28/19 1316 08/29/19 0409  HGB 12.9* 13.3 13.2  HCT 38.8* 39.0 39.5  PLT 281  --  222  APTT 34  --   --   LABPROT 13.9  --   --   INR 1.1  --   --   HEPARINUNFRC  --   --  <0.10*  CREATININE 1.51* 1.40*  --       Medical History: Past Medical History:  Diagnosis Date  . Benign essential tremor   . Benign positional vertigo   . CVA (cerebral vascular accident) ([place])    x2 - L retina, 1 right parietal  . Degenerative arthritis   . Depression   . Diabetes mellitus   . Dyslipidemia   . GERD (gastroesophageal reflux disease)    hiatal hernia  . Gout   . H/O: vasectomy   . Hearing aid worn    b/l  . Hx of appendectomy   . Hx of tonsillectomy   . Hypertension   . 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 Melanoma ([place])   . Obesity   . OSA on CPAP    setting = 5  . Tremor, essential 06/22/2017  . Wears glasses     Assessment:  68 year old male admitted with aphasia. He did not receive tPA as he was outside the window. Planning to start heparin while undergoing workup. He is not on anticoagulation prior to admission. Of note, found to have intraluminal thrombus in the left subclavian artery.  Initial heparin level came back undetectable, on 1500 units/hr. Hgb 13.2, plt 222. No s/sx of bleeding.   Goal of Therapy:  Heparin level 0.3-0.5 units/ml Monitor platelets by anticoagulation protocol: Yes    Plan:  -Increase heparin infusion to 1800 units/hr -Order heparin level in 6 hr -Daily HL, CBC, s/sx of bleeding  Antonietta Jewel, PharmD, BCCCP Clinical Pharmacist   Please check AMION for all [place] phone numbers After 10:00 PM, call  [place] [phone] 08/29/2019,5:20 AM

## 2019-08-29 NOTE — ED Notes (Signed)
Heart healthy/carb modified lunch tray ordered 

## 2019-08-29 NOTE — ED Notes (Signed)
ED TO INPATIENT HANDOFF REPORT  ED Nurse Name and Phone #:   S Name/Age/Gender Joshua [name] 68 y.o. male Room/Bed: 013C/013C  Code Status   Code Status: Full Code  Home/SNF/Other Home Patient oriented to: self, place, time and situation Is this baseline? Yes   Triage Complete: Triage complete  Chief Complaint weakness, slow speech  Triage Note Pt present with [facility] for weakness and slurred speech and speech is slow to respond.  Hx of CVS.  Pt has deep brain stimulant device for tremor.  No falls.  Denies blood thinners.  Equal grasps. Pt reports his tongue feels "heavier," and hard to swallow.  Munjor is approximately 2100 08/27/19.  Lunch order   Allergies No Known Allergies  Level of Care/Admitting Diagnosis ED Disposition    ED Disposition Condition Comment   Admit  Hospital Area: [facility] [[id number]]  Level of Care: Telemetry Medical [104]  Covid Evaluation: Asymptomatic Screening Protocol (No Symptoms)  Diagnosis: Subclavian artery thrombosis [facility]) [id number]  Admitting Physician: [name] [id number]  Attending Physician: [name] [phone]  Estimated length of stay: past midnight tomorrow  Certification:: I certify this patient will need inpatient services for at least 2 midnights  PT Class (Do Not Modify): Inpatient [101]  PT Acc Code (Do Not Modify): Private [1]       B Medical/Surgery History Past Medical History:  Diagnosis Date  . Benign essential tremor   . Benign positional vertigo   . CVA (cerebral vascular accident) ([place])    x2 - L retina, 1 right parietal  . Degenerative arthritis   . Depression   . Diabetes mellitus   . Dyslipidemia   . GERD (gastroesophageal reflux disease)    hiatal hernia  . Gout   . H/O: vasectomy   . Hearing aid worn    b/l  . Hx of appendectomy   . Hx of tonsillectomy   . Hypertension   . 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 Melanoma ([place])   . Obesity   . OSA on  CPAP    setting = 5  . Tremor, essential 06/22/2017  . Wears glasses    Past Surgical History:  Procedure Laterality Date  . APPENDECTOMY    . arthroscopic knee surgery Bilateral   . CATARACT EXTRACTION Bilateral   . COLONOSCOPY    . MINOR PLACEMENT OF FIDUCIAL N/A 06/30/2019   Procedure: Fiducial placement;  Surgeon: Erline Levine, MD;  Location: [facility];  Service: Neurosurgery;  Laterality: N/A;  Fiducial placement  . NASAL SEPTUM SURGERY    . PULSE GENERATOR IMPLANT N/A 07/14/2019   Procedure: Left cranial Implanted Pulse Generator and lead extension placement to right chest ;  Surgeon: Erline Levine, MD;  Location: [facility];  Service: Neurosurgery;  Laterality: N/A;  . SUBTHALAMIC STIMULATOR INSERTION Left 07/07/2019   Procedure: LEFT DEEP BRAIN STIMULATOR PLACEMENT;  Surgeon: Erline Levine, MD;  Location: [facility];  Service: Neurosurgery;  Laterality: Left;  . TONSILLECTOMY    . TOTAL KNEE ARTHROPLASTY Left 03/30/2017   Procedure: LEFT TOTAL KNEE ARTHROPLASTY;  Surgeon: Paralee Cancel, MD;  Location: [facility];  Service: Orthopedics;  Laterality: Left;  . WISDOM TOOTH EXTRACTION       A IV Location/Drains/Wounds Patient Lines/Drains/Airways Status   Active Line/Drains/Airways    Name:   Placement date:   Placement time:   Site:   Days:   Peripheral IV 08/28/19 Right;Lateral Forearm   08/28/19  1910    Forearm   1   Incision (Closed) 03/30/17 Leg Left   03/30/17    0824     882   Incision (Closed) 07/07/19 Head   07/07/19    1020     53   Incision (Closed) 07/14/19 Head   07/14/19    0857     46   Incision (Closed) 07/14/19 Chest   07/14/19    0857     46          Intake/Output Last 24 hours  Intake/Output Summary (Last 24 hours) at 08/29/2019 1545 Last data filed at 08/29/2019 1426 Gross per 24 hour  Intake 1083.91 ml  Output 650 ml  Net 433.91 ml    Labs/Imaging Results for orders placed or performed during the hospital encounter of 08/28/19 (from the past 48 hour(s))   Protime-INR     Status: None   Collection Time: 08/28/19  1:00 PM  Result Value Ref Range   Prothrombin Time 13.9 11.4 - 15.2 seconds   INR 1.1 0.8 - 1.2    Comment: (NOTE) INR goal varies based on device and disease states. Performed at [facility], [place] [address], [place], [place] [address]   APTT     Status: None   Collection Time: 08/28/19  1:00 PM  Result Value Ref Range   aPTT 34 24 - 36 seconds    Comment: Performed at [facility] [address], [place], [place] [address]  CBC     Status: Abnormal   Collection Time: 08/28/19  1:00 PM  Result Value Ref Range   WBC 8.1 4.0 - 10.5 K/uL   RBC 4.33 4.22 - 5.81 MIL/uL   Hemoglobin 12.9 (L) 13.0 - 17.0 g/dL   HCT 38.8 (L) 39.0 - 52.0 %   MCV 89.6 80.0 - 100.0 fL   MCH 29.8 26.0 - 34.0 pg   MCHC 33.2 30.0 - 36.0 g/dL   RDW 12.8 11.5 - 15.5 %   Platelets 281 150 - 400 K/uL   nRBC 0.0 0.0 - 0.2 %    Comment: Performed at [facility], [place] [address], [place], [place] [address]  Differential     Status: None   Collection Time: 08/28/19  1:00 PM  Result Value Ref Range   Neutrophils Relative % 50 %   Neutro Abs 4.1 1.7 - 7.7 K/uL   Lymphocytes Relative 37 %   Lymphs Abs 3.0 0.7 - 4.0 K/uL   Monocytes Relative 8 %   Monocytes Absolute 0.6 0.1 - 1.0 K/uL   Eosinophils Relative 4 %   Eosinophils Absolute 0.3 0.0 - 0.5 K/uL   Basophils Relative 1 %   Basophils Absolute 0.1 0.0 - 0.1 K/uL   Immature Granulocytes 0 %   Abs Immature Granulocytes 0.03 0.00 - 0.07 K/uL    Comment: Performed at [facility] [address] [place], [place] [address]  Comprehensive metabolic panel     Status: Abnormal   Collection Time: 08/28/19  1:00 PM  Result Value Ref Range   Sodium 140 135 - 145 mmol/L   Potassium 4.2 3.5 - 5.1 mmol/L   Chloride 105 98 - 111 mmol/L   CO2 27 22 - 32 mmol/L   Glucose, Bld 196 (H) 70 - 99 mg/dL   BUN 25 (H) 8 - 23 mg/dL   Creatinine, Ser 1.51 (H) 0.61 - 1.24 mg/dL   Calcium 9.3  8.9 - 10.3  mg/dL   Total Protein 6.7 6.5 - 8.1 g/dL   Albumin 3.3 (L) 3.5 - 5.0 g/dL   AST 22 15 - 41 U/L   ALT 16 0 - 44 U/L   Alkaline Phosphatase 81 38 - 126 U/L   Total Bilirubin 0.5 0.3 - 1.2 mg/dL   GFR calc non Af Amer 46 (L) >60 mL/min   GFR calc Af Amer 53 (L) >60 mL/min   Anion gap 8 5 - 15    Comment: Performed at [facility] [address], [place], [place] [address]  CBG monitoring, ED     Status: Abnormal   Collection Time: 08/28/19  1:00 PM  Result Value Ref Range   Glucose-Capillary 181 (H) 70 - 99 mg/dL   Comment 1 Notify RN    Comment 2 Document in Chart   I-stat chem 8, ED     Status: Abnormal   Collection Time: 08/28/19  1:16 PM  Result Value Ref Range   Sodium 141 135 - 145 mmol/L   Potassium 4.2 3.5 - 5.1 mmol/L   Chloride 105 98 - 111 mmol/L   BUN 31 (H) 8 - 23 mg/dL   Creatinine, Ser 1.40 (H) 0.61 - 1.24 mg/dL   Glucose, Bld 198 (H) 70 - 99 mg/dL   Calcium, Ion 1.20 1.15 - 1.40 mmol/L   TCO2 29 22 - 32 mmol/L   Hemoglobin 13.3 13.0 - 17.0 g/dL   HCT 39.0 39.0 - 52.0 %  Urine rapid drug screen (hosp performed)not at [facility]     Status: None   Collection Time: 08/28/19  2:13 PM  Result Value Ref Range   Opiates NONE DETECTED NONE DETECTED   Cocaine NONE DETECTED NONE DETECTED   Benzodiazepines NONE DETECTED NONE DETECTED   Amphetamines NONE DETECTED NONE DETECTED   Tetrahydrocannabinol NONE DETECTED NONE DETECTED   Barbiturates NONE DETECTED NONE DETECTED    Comment: (NOTE) DRUG SCREEN FOR MEDICAL PURPOSES ONLY.  IF CONFIRMATION IS NEEDED FOR ANY PURPOSE, NOTIFY LAB WITHIN 5 DAYS. LOWEST DETECTABLE LIMITS FOR URINE DRUG SCREEN Drug Class                     Cutoff (ng/mL) Amphetamine and metabolites    1000 Barbiturate and metabolites    200 Benzodiazepine                 [id number] Tricyclics and metabolites     300 Opiates and metabolites        300 Cocaine and metabolites        300 THC                            50 Performed at [facility], [place] [address] [place],  [address]   Urinalysis, Routine w reflex microscopic (not at [facility])     Status: Abnormal   Collection Time: 08/28/19  2:13 PM  Result Value Ref Range   Color, Urine YELLOW YELLOW   APPearance CLEAR CLEAR   Specific Gravity, Urine 1.024 1.005 - 1.030   pH 7.0 5.0 - 8.0   Glucose, UA 150 (A) NEGATIVE mg/dL   Hgb urine dipstick NEGATIVE NEGATIVE   Bilirubin Urine NEGATIVE NEGATIVE   Ketones, ur NEGATIVE NEGATIVE mg/dL   Protein, ur >=300 (A) NEGATIVE mg/dL   Nitrite NEGATIVE NEGATIVE   Leukocytes,Ua NEGATIVE NEGATIVE   RBC / HPF 0-5 0 - 5 RBC/hpf  WBC, UA 0-5 0 - 5 WBC/hpf   Bacteria, UA NONE SEEN NONE SEEN   Hyaline Casts, UA PRESENT     Comment: Performed at [facility] [address], [place], [place] [address]  SARS CORONAVIRUS 2 (TAT 6-24 HRS) Nasopharyngeal Urine, Clean Catch     Status: None   Collection Time: 08/28/19  2:13 PM   Specimen: Urine, Clean Catch; Nasopharyngeal  Result Value Ref Range   SARS Coronavirus 2 NEGATIVE NEGATIVE    Comment: (NOTE) SARS-CoV-2 target nucleic acids are NOT DETECTED. The SARS-CoV-2 RNA is generally detectable in upper and lower respiratory specimens during the acute phase of infection. Negative results do not preclude SARS-CoV-2 infection, do not rule out co-infections with other pathogens, and should not be used as the sole basis for treatment or other patient management decisions. Negative results must be combined with clinical observations, patient history, and epidemiological information. The expected result is Negative. Fact Sheet for Patients: SugarRoll.be Fact Sheet for Healthcare Providers: https://www.woods-mathews.com/ This test is not yet approved or cleared by the [place] FDA and  has been authorized for detection and/or diagnosis of SARS-CoV-2 by FDA under an Emergency Use Authorization (EUA). This EUA will remain  in effect  (meaning this test can be used) for the duration of the COVID-19 declaration under Section 56 4(b)(1) of the Act, 21 U.S.C. section 360bbb-3(b)(1), unless the authorization is terminated or revoked sooner. Performed at [facility], [place] [address], [place], [place] [address]   HIV Antibody (routine testing w rflx)     Status: None   Collection Time: 08/28/19  7:08 PM  Result Value Ref Range   HIV Screen 4th Generation wRfx NON REACTIVE NON REACTIVE    Comment: Performed at [facility], [place] [address] [place], [place] [address]  CBG monitoring, ED     Status: Abnormal   Collection Time: 08/28/19  7:28 PM  Result Value Ref Range   Glucose-Capillary 122 (H) 70 - 99 mg/dL  CBG monitoring, ED     Status: Abnormal   Collection Time: 08/28/19 10:12 PM  Result Value Ref Range   Glucose-Capillary 103 (H) 70 - 99 mg/dL  Hemoglobin A1c     Status: Abnormal   Collection Time: 08/29/19  4:09 AM  Result Value Ref Range   Hgb A1c MFr Bld 7.8 (H) 4.8 - 5.6 %    Comment: (NOTE) Pre diabetes:          5.7%-6.4% Diabetes:              >6.4% Glycemic control for   <7.0% adults with diabetes    Mean Plasma Glucose 177.16 mg/dL    Comment: Performed at [facility], [place] [address], [place], [place] [address]  Lipid panel     Status: Abnormal   Collection Time: 08/29/19  4:09 AM  Result Value Ref Range   Cholesterol 183 0 - 200 mg/dL   Triglycerides 249 (H) <150 mg/dL   HDL 26 (L) >40 mg/dL   Total CHOL/HDL Ratio 7.0 RATIO   VLDL 50 (H) 0 - 40 mg/dL   LDL Cholesterol 107 (H) 0 - 99 mg/dL    Comment:        Total Cholesterol/HDL:CHD Risk Coronary Heart Disease Risk Table                     Men   Women  1/2 Average Risk   3.4   3.3  Average Risk       5.0   4.4  2 X Average Risk   9.6   7.1  3 X Average Risk  23.4   11.0        Use the calculated Patient Ratio above and the CHD Risk Table to determine the patient's CHD Risk.        ATP III CLASSIFICATION (LDL):   <100     mg/dL   Optimal  100-129  mg/dL   Near or Above                    Optimal  130-159  mg/dL   Borderline  160-189  mg/dL   High  >190     mg/dL   Very High Performed at [facility] [address], [place], [place] [address]   Heparin level (unfractionated)     Status: Abnormal   Collection Time: 08/29/19  4:09 AM  Result Value Ref Range   Heparin Unfractionated <0.10 (L) 0.30 - 0.70 IU/mL    Comment: (NOTE) If heparin results are below expected values, and patient dosage has  been confirmed, suggest follow up testing of antithrombin III levels. Performed at [facility], [place] [address] [place] [address]   CBC     Status: None   Collection Time: 08/29/19  4:09 AM  Result Value Ref Range   WBC 8.1 4.0 - 10.5 K/uL   RBC 4.33 4.22 - 5.81 MIL/uL   Hemoglobin 13.2 13.0 - 17.0 g/dL   HCT 39.5 39.0 - 52.0 %   MCV 91.2 80.0 - 100.0 fL   MCH 30.5 26.0 - 34.0 pg   MCHC 33.4 30.0 - 36.0 g/dL   RDW 13.0 11.5 - 15.5 %   Platelets 222 150 - 400 K/uL   nRBC 0.0 0.0 - 0.2 %    Comment: Performed at [facility], [place] [address], [place], [place] [address]  CBG monitoring, ED     Status: None   Collection Time: 08/29/19  1:15 PM  Result Value Ref Range   Glucose-Capillary 92 70 - 99 mg/dL  Heparin level (unfractionated)     Status: Abnormal   Collection Time: 08/29/19  2:30 PM  Result Value Ref Range   Heparin Unfractionated <0.10 (L) 0.30 - 0.70 IU/mL    Comment: (NOTE) If heparin results are below expected values, and patient dosage has  been confirmed, suggest follow up testing of antithrombin III levels. Performed at [facility], [place] [address], [place], [place] [address]    Ct Code Stroke Cta Head W/wo Contrast  Addendum Date: 08/28/2019   ADDENDUM REPORT: 08/28/2019 15:19 ADDENDUM: Study reviewed by telephone with Dr. Roland Rack on 08/28/2019 at 15:15. He states that clinically he is suspicious for a small cerebellar infarct,  which makes the suspected small thrombus in the proximal left subclavian artery more likely to be the dominant finding. We discussed that the left MCA middle division M3 branch appearance might be related to streak artifact from the DBS. Electronically Signed   By: Genevie Ann M.D.   On: 08/28/2019 15:19   Result Date: 08/28/2019 CLINICAL DATA:  68 year old male with slurred speech. History of deep brain stimulator. EXAM: CT ANGIOGRAPHY HEAD AND NECK CT PERFUSION BRAIN TECHNIQUE: Multidetector CT imaging of the head and neck was performed using the standard protocol during bolus administration of intravenous contrast. Multiplanar CT image reconstructions and MIPs were obtained to evaluate  the vascular anatomy. Carotid stenosis measurements (when applicable) are obtained utilizing NASCET criteria, using the distal internal carotid diameter as the denominator. Multiphase CT imaging of the brain was performed following IV bolus contrast injection. Subsequent parametric perfusion maps were calculated using RAPID software. CONTRAST:  127mL OMNIPAQUE IOHEXOL 350 MG/ML SOLN COMPARISON:  Head CT 07/07/2019 and earlier. CTA head and neck 04/09/2011. FINDINGS: CT HEAD FINDINGS Brain: Resolved postoperative pneumocephalus since last month. The left deep brain stimulator device terminates at the left thalamus as before. No acute intracranial hemorrhage identified. No midline shift, mass effect, or evidence of intracranial mass lesion. No ventriculomegaly. Evidence of a chronic right thalamic lacunar infarct is unchanged. Patchy white matter hypodensity in both hemispheres appears stable. No cortically based acute infarct identified. Vascular: Calcified atherosclerosis at the skull base. Skull: Vertex burr holes.  No acute osseous abnormality identified. Sinuses/Orbits: Visualized paranasal sinuses and mastoids are stable and well pneumatized. Other: Left vertex approach electrical lead with removed skin staples and resolved scalp  gas since the postoperative CT last month. Electrode has now been tunneled across the vertex into the right scalp and neck. Stable orbits soft tissues, with a small preseptal infraorbital calcification or retained foreign body as before. This has been present since 2012, with 2 successful brain MRI since that time. ASPECTS [facility] Stroke Program Early CT Score) Total score (0-10 with 10 being normal): 10 Review of the MIP images confirms the above findings CT Brain Perfusion Findings: CBF (<30%) Volume: 0 mL Perfusion (Tmax>6.0s) volume: 0 mL Mismatch Volume: Not applicable Infarction Location:Not applicable CTA NECK Skeleton: No acute osseous abnormality identified. Multilevel cervical spine ankylosis in part related to flowing anterior osteophytes. Upper chest: Right chest generator device now in place. Otherwise negative. Other neck: Right neck stimulator lead tracking to the vertex, no adverse features. Otherwise stable since 2012. Aortic arch: 3 vessel arch configuration. Mild arch calcified plaque. Mild arch ectasia. Right carotid system: Tortuous brachiocephalic artery and proximal right CCA without stenosis. Calcified plaque at the right ICA origin and bulb has not significantly changed since 2012, less than 50 % stenosis with respect to the distal vessel. Highly tortuous cervical right ICA without stenosis to the skull base. Left carotid system: No left CCA origin plaque or stenosis. Calcified plaque at the left ICA origin and bulb has mildly progressed since 2012. Less than 50 % stenosis with respect to the distal vessel. Highly tortuous cervical left ICA which is mildly dolichoectatic to the skull base. Vertebral arteries: Mild plaque in the proximal right subclavian artery without stenosis. Normal right vertebral artery origin. Patent right vertebral artery to the skull base without stenosis. Right V2 tortuosity. There is a small focus of adherent thrombus in the proximal left subclavian artery on  series 10, image 169, new since 2012. There is nearby soft and calcified plaque which appears more stable. There is no proximal left subclavian artery stenosis. The left vertebral artery origin remains patent with mild to moderate stenosis (series 13, image 170). The left vertebral artery is tortuous, mildly dominant, and patent to the skull base without additional stenosis. Tortuous left V1 segment CTA HEAD Posterior circulation: No distal vertebral artery stenosis, the left is mildly dominant. Normal right PICA origin. Patent vertebrobasilar junction without stenosis. Patent basilar artery without stenosis. The left AICA may be dominant, unchanged. Patent SCA and PCA origins. Mild right P1 irregularity and stenosis is new on series 16, image 27. Bilateral PCA branches are otherwise stable and without significant stenosis. Anterior circulation:  Both ICA siphons are patent. On the left there is mild calcified plaque without stenosis. Normal right ophthalmic artery origin. On the right there is mild to moderate calcified plaque without stenosis. Normal right ophthalmic artery origin. Posterior communicating arteries are diminutive or absent. Dominant left ACA A1 segment. MCA and left ACA origins are mildly ectatic. Diminutive right A1. Anterior communicating artery is mildly ectatic. Bilateral ACA branches are stable and within normal limits. Left MCA M1 and bifurcation are patent without stenosis. Suggestion of moderate new left MCA middle in 3 branch irregularity and stenosis since 2012. Right MCA M1 segment, bifurcation, and right MCA branches appear patent and stable since 2012. Venous sinuses: Patent. Anatomic variants: Mildly dominant left vertebral artery. Dominant left and diminutive right ACA A1 segments. Review of the MIP images confirms the above findings IMPRESSION: 1. Negative for large vessel occlusion, and no core infarct or ischemic penumbra identified by CT Perfusion. 2. Positive for a small 5 mm  focus of adherent thrombus in the proximal left subclavian artery on series 10, image 169. Nearby chronic left subclavian plaque. 3. No large vessel stenosis in the head or neck, but suggestion of Left MCA middle division M3 branch moderate irregularity and stenosis since 2012. Unclear if this might be related to a possible recent thromboembolic event implied by #2. 4. Satisfactory postoperative CT appearance of the brain following left deep brain stimulator device in August. 5. Bilateral carotid atherosclerosis with mild progression since 2012. No significant stenosis. Study discussed by telephone with Dr. Roland Rack on 08/28/2019 at 14:30 . Electronically Signed: By: Genevie Ann M.D. On: 08/28/2019 14:31   Ct Code Stroke Cta Neck W/wo Contrast  Addendum Date: 08/28/2019   ADDENDUM REPORT: 08/28/2019 15:19 ADDENDUM: Study reviewed by telephone with Dr. Roland Rack on 08/28/2019 at 15:15. He states that clinically he is suspicious for a small cerebellar infarct, which makes the suspected small thrombus in the proximal left subclavian artery more likely to be the dominant finding. We discussed that the left MCA middle division M3 branch appearance might be related to streak artifact from the DBS. Electronically Signed   By: Genevie Ann M.D.   On: 08/28/2019 15:19   Result Date: 08/28/2019 CLINICAL DATA:  68 year old male with slurred speech. History of deep brain stimulator. EXAM: CT ANGIOGRAPHY HEAD AND NECK CT PERFUSION BRAIN TECHNIQUE: Multidetector CT imaging of the head and neck was performed using the standard protocol during bolus administration of intravenous contrast. Multiplanar CT image reconstructions and MIPs were obtained to evaluate the vascular anatomy. Carotid stenosis measurements (when applicable) are obtained utilizing NASCET criteria, using the distal internal carotid diameter as the denominator. Multiphase CT imaging of the brain was performed following IV bolus contrast injection.  Subsequent parametric perfusion maps were calculated using RAPID software. CONTRAST:  169mL OMNIPAQUE IOHEXOL 350 MG/ML SOLN COMPARISON:  Head CT 07/07/2019 and earlier. CTA head and neck 04/09/2011. FINDINGS: CT HEAD FINDINGS Brain: Resolved postoperative pneumocephalus since last month. The left deep brain stimulator device terminates at the left thalamus as before. No acute intracranial hemorrhage identified. No midline shift, mass effect, or evidence of intracranial mass lesion. No ventriculomegaly. Evidence of a chronic right thalamic lacunar infarct is unchanged. Patchy white matter hypodensity in both hemispheres appears stable. No cortically based acute infarct identified. Vascular: Calcified atherosclerosis at the skull base. Skull: Vertex burr holes.  No acute osseous abnormality identified. Sinuses/Orbits: Visualized paranasal sinuses and mastoids are stable and well pneumatized. Other: Left vertex approach electrical lead with  removed skin staples and resolved scalp gas since the postoperative CT last month. Electrode has now been tunneled across the vertex into the right scalp and neck. Stable orbits soft tissues, with a small preseptal infraorbital calcification or retained foreign body as before. This has been present since 2012, with 2 successful brain MRI since that time. ASPECTS [facility] Stroke Program Early CT Score) Total score (0-10 with 10 being normal): 10 Review of the MIP images confirms the above findings CT Brain Perfusion Findings: CBF (<30%) Volume: 0 mL Perfusion (Tmax>6.0s) volume: 0 mL Mismatch Volume: Not applicable Infarction Location:Not applicable CTA NECK Skeleton: No acute osseous abnormality identified. Multilevel cervical spine ankylosis in part related to flowing anterior osteophytes. Upper chest: Right chest generator device now in place. Otherwise negative. Other neck: Right neck stimulator lead tracking to the vertex, no adverse features. Otherwise stable since 2012.  Aortic arch: 3 vessel arch configuration. Mild arch calcified plaque. Mild arch ectasia. Right carotid system: Tortuous brachiocephalic artery and proximal right CCA without stenosis. Calcified plaque at the right ICA origin and bulb has not significantly changed since 2012, less than 50 % stenosis with respect to the distal vessel. Highly tortuous cervical right ICA without stenosis to the skull base. Left carotid system: No left CCA origin plaque or stenosis. Calcified plaque at the left ICA origin and bulb has mildly progressed since 2012. Less than 50 % stenosis with respect to the distal vessel. Highly tortuous cervical left ICA which is mildly dolichoectatic to the skull base. Vertebral arteries: Mild plaque in the proximal right subclavian artery without stenosis. Normal right vertebral artery origin. Patent right vertebral artery to the skull base without stenosis. Right V2 tortuosity. 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 There is nearby soft and calcified plaque which appears more stable. There is no proximal left subclavian artery stenosis. The left vertebral artery origin remains patent with mild to moderate stenosis (series 13, image 170). The left vertebral artery is tortuous, mildly dominant, and patent to the skull base without additional stenosis. Tortuous left V1 segment CTA HEAD Posterior circulation: No distal vertebral artery stenosis, the left is mildly dominant. Normal right PICA origin. Patent vertebrobasilar junction without stenosis. Patent basilar artery without stenosis. The left AICA may be dominant, unchanged. Patent SCA and PCA origins. Mild right P1 irregularity and stenosis is new on series 16, image 27. Bilateral PCA branches are otherwise stable and without significant stenosis. Anterior circulation: Both ICA siphons are patent. On the left there is mild calcified plaque without stenosis. Normal  right ophthalmic artery origin. On the right there is mild to moderate calcified plaque without stenosis. Normal right ophthalmic artery origin. Posterior communicating arteries are diminutive or absent. Dominant left ACA A1 segment. MCA and left ACA origins are mildly ectatic. Diminutive right A1. Anterior communicating artery is mildly ectatic. Bilateral ACA branches are stable and within normal limits. Left MCA M1 and bifurcation are patent without stenosis. Suggestion of moderate new left MCA middle in 3 branch irregularity and stenosis since 2012. Right MCA M1 segment, bifurcation, and right MCA branches appear patent and stable since 2012. Venous sinuses: Patent. Anatomic variants: Mildly dominant left vertebral artery. Dominant left and diminutive right ACA A1 segments. Review of the MIP images confirms the above findings IMPRESSION: 1. Negative for large vessel occlusion, and no core infarct or ischemic penumbra identified by CT Perfusion. 2. Positive for a small 5 mm  focus of adherent thrombus in the proximal left subclavian artery on series 10, image 169. Nearby chronic left subclavian plaque. 3. No large vessel stenosis in the head or neck, but suggestion of Left MCA middle division M3 branch moderate irregularity and stenosis since 2012. Unclear if this might be related to a possible recent thromboembolic event implied by #2. 4. Satisfactory postoperative CT appearance of the brain following left deep brain stimulator device in August. 5. Bilateral carotid atherosclerosis with mild progression since 2012. No significant stenosis. Study discussed by telephone with Dr. Roland Rack on 08/28/2019 at 14:30 . Electronically Signed: By: Genevie Ann M.D. On: 08/28/2019 14:31   Ct Code Stroke Cta Cerebral Perfusion W/wo Contrast  Addendum Date: 08/28/2019   ADDENDUM REPORT: 08/28/2019 15:19 ADDENDUM: Study reviewed by telephone with Dr. Roland Rack on 08/28/2019 at 15:15. He states that clinically  he is suspicious for a small cerebellar infarct, which makes the suspected small thrombus in the proximal left subclavian artery more likely to be the dominant finding. We discussed that the left MCA middle division M3 branch appearance might be related to streak artifact from the DBS. Electronically Signed   By: Genevie Ann M.D.   On: 08/28/2019 15:19   Result Date: 08/28/2019 CLINICAL DATA:  68 year old male with slurred speech. History of deep brain stimulator. EXAM: CT ANGIOGRAPHY HEAD AND NECK CT PERFUSION BRAIN TECHNIQUE: Multidetector CT imaging of the head and neck was performed using the standard protocol during bolus administration of intravenous contrast. Multiplanar CT image reconstructions and MIPs were obtained to evaluate the vascular anatomy. Carotid stenosis measurements (when applicable) are obtained utilizing NASCET criteria, using the distal internal carotid diameter as the denominator. Multiphase CT imaging of the brain was performed following IV bolus contrast injection. Subsequent parametric perfusion maps were calculated using RAPID software. CONTRAST:  120mL OMNIPAQUE IOHEXOL 350 MG/ML SOLN COMPARISON:  Head CT 07/07/2019 and earlier. CTA head and neck 04/09/2011. FINDINGS: CT HEAD FINDINGS Brain: Resolved postoperative pneumocephalus since last month. The left deep brain stimulator device terminates at the left thalamus as before. No acute intracranial hemorrhage identified. No midline shift, mass effect, or evidence of intracranial mass lesion. No ventriculomegaly. Evidence of a chronic right thalamic lacunar infarct is unchanged. Patchy white matter hypodensity in both hemispheres appears stable. No cortically based acute infarct identified. Vascular: Calcified atherosclerosis at the skull base. Skull: Vertex burr holes.  No acute osseous abnormality identified. Sinuses/Orbits: Visualized paranasal sinuses and mastoids are stable and well pneumatized. Other: Left vertex approach electrical  lead with removed skin staples and resolved scalp gas since the postoperative CT last month. Electrode has now been tunneled across the vertex into the right scalp and neck. Stable orbits soft tissues, with a small preseptal infraorbital calcification or retained foreign body as before. This has been present since 2012, with 2 successful brain MRI since that time. ASPECTS [name] Stroke Program Early CT Score) Total score (0-10 with 10 being normal): 10 Review of the MIP images confirms the above findings CT Brain Perfusion Findings: CBF (<30%) Volume: 0 mL Perfusion (Tmax>6.0s) volume: 0 mL Mismatch Volume: Not applicable Infarction Location:Not applicable CTA NECK Skeleton: No acute osseous abnormality identified. Multilevel cervical spine ankylosis in part related to flowing anterior osteophytes. Upper chest: Right chest generator device now in place. Otherwise negative. Other neck: Right neck stimulator lead tracking to the vertex, no adverse features. Otherwise stable since 2012. Aortic arch: 3 vessel arch configuration. Mild arch calcified plaque. Mild arch ectasia. Right carotid system:  Tortuous brachiocephalic artery and proximal right CCA without stenosis. Calcified plaque at the right ICA origin and bulb has not significantly changed since 2012, less than 50 % stenosis with respect to the distal vessel. Highly tortuous cervical right ICA without stenosis to the skull base. Left carotid system: No left CCA origin plaque or stenosis. Calcified plaque at the left ICA origin and bulb has mildly progressed since 2012. Less than 50 % stenosis with respect to the distal vessel. Highly tortuous cervical left ICA which is mildly dolichoectatic to the skull base. Vertebral arteries: Mild plaque in the proximal right subclavian artery without stenosis. Normal right vertebral artery origin. Patent right vertebral artery to the skull base without stenosis. Right V2 tortuosity. There is a small focus of adherent  thrombus in the proximal left subclavian artery on series 10, image 169, new since 2012. There is nearby soft and calcified plaque which appears more stable. There is no proximal left subclavian artery stenosis. The left vertebral artery origin remains patent with mild to moderate stenosis (series 13, image 170). The left vertebral artery is tortuous, mildly dominant, and patent to the skull base without additional stenosis. Tortuous left V1 segment CTA HEAD Posterior circulation: No distal vertebral artery stenosis, the left is mildly dominant. Normal right PICA origin. Patent vertebrobasilar junction without stenosis. Patent basilar artery without stenosis. The left AICA may be dominant, unchanged. Patent SCA and PCA origins. Mild right P1 irregularity and stenosis is new on series 16, image 27. Bilateral PCA branches are otherwise stable and without significant stenosis. Anterior circulation: Both ICA siphons are patent. On the left there is mild calcified plaque without stenosis. Normal right ophthalmic artery origin. On the right there is mild to moderate calcified plaque without stenosis. Normal right ophthalmic artery origin. Posterior communicating arteries are diminutive or absent. Dominant left ACA A1 segment. MCA and left ACA origins are mildly ectatic. Diminutive right A1. Anterior communicating artery is mildly ectatic. Bilateral ACA branches are stable and within normal limits. Left MCA M1 and bifurcation are patent without stenosis. Suggestion of moderate new left MCA middle in 3 branch irregularity and stenosis since 2012. Right MCA M1 segment, bifurcation, and right MCA branches appear patent and stable since 2012. Venous sinuses: Patent. Anatomic variants: Mildly dominant left vertebral artery. Dominant left and diminutive right ACA A1 segments. Review of the MIP images confirms the above findings IMPRESSION: 1. Negative for large vessel occlusion, and no core infarct or ischemic penumbra  identified by CT Perfusion. 2. Positive for a small 5 mm focus of adherent thrombus in the proximal left subclavian artery on series 10, image 169. Nearby chronic left subclavian plaque. 3. No large vessel stenosis in the head or neck, but suggestion of Left MCA middle division M3 branch moderate irregularity and stenosis since 2012. Unclear if this might be related to a possible recent thromboembolic event implied by #2. 4. Satisfactory postoperative CT appearance of the brain following left deep brain stimulator device in August. 5. Bilateral carotid atherosclerosis with mild progression since 2012. No significant stenosis. Study discussed by telephone with Dr. Roland Rack on 08/28/2019 at 14:30 . Electronically Signed: By: Genevie Ann M.D. On: 08/28/2019 14:31    Pending Labs Unresulted Labs (From admission, onward)    Start     Ordered   08/29/19 2300  Heparin level (unfractionated)  Once-Timed,   STAT     08/29/19 1524   08/29/19 0500  Heparin level (unfractionated)  Daily,   R  08/28/19 1629   08/29/19 0500  CBC  Daily,   R     08/28/19 1629   08/28/19 1616  HIV4GL Save Tube  (HIV Antibody (Routine testing w reflex) panel)  Once,   STAT     08/28/19 1621          Vitals/Pain Today's Vitals   08/29/19 1100 08/29/19 1145 08/29/19 1231 08/29/19 1429  BP: (!) 153/75 (!) 159/93  (!) 137/92  Pulse: 72 77 84 77  Resp: 13 14 17  (!) 21  Temp:      TempSrc:      SpO2: 98% 100% 95% 95%  Weight:      Height:      PainSc:        Isolation Precautions No active isolations  Medications Medications  sodium chloride 0.9 % bolus 500 mL (0 mLs Intravenous Stopped 08/28/19 1414)    Followed by  0.9 %  sodium chloride infusion (0 mL/hr Intravenous Stopped 08/28/19 2003)   stroke: mapping our early stages of recovery book (has no administration in time range)  0.9 %  sodium chloride infusion ( Intravenous Rate/Dose Verify 08/29/19 0444)  acetaminophen (TYLENOL) tablet 650 mg (has no  administration in time range)    Or  acetaminophen (TYLENOL) solution 650 mg (has no administration in time range)    Or  acetaminophen (TYLENOL) suppository 650 mg (has no administration in time range)  ondansetron (ZOFRAN) injection 4 mg (has no administration in time range)  atorvastatin (LIPITOR) tablet 80 mg (80 mg Oral Given 08/28/19 2003)  insulin aspart (novoLOG) injection 0-15 Units (0 Units Subcutaneous Not Given 08/29/19 1316)  insulin aspart (novoLOG) injection 0-5 Units (0 Units Subcutaneous Not Given 08/28/19 2257)  heparin ADULT infusion 100 units/mL (25000 units/244mL sodium chloride 0.45%) (0 Units/hr Intravenous Stopped 08/29/19 1408)  perflutren lipid microspheres (DEFINITY) IV suspension (5 mLs Intravenous Given 08/29/19 1447)  ondansetron (ZOFRAN) injection 4 mg (4 mg Intravenous Given 08/28/19 1330)  iohexol (OMNIPAQUE) 350 MG/ML injection 100 mL (100 mLs Intravenous Contrast Given 08/28/19 1344)  heparin bolus via infusion 4,000 Units (4,000 Units Intravenous Bolus from Bag 08/28/19 1748)    Mobility walks High fall risk   Focused Assessments Cardiac Assessment Handoff:  Cardiac Rhythm: Normal sinus rhythm Lab Results  Component Value Date   TROPONINI <0.03 04/09/2017   No results found for: DDIMER Does the Patient currently have chest pain? No     R Recommendations: See Admitting Provider Note  Report given to:   Additional Notes:

## 2019-08-29 NOTE — Progress Notes (Signed)
  Echocardiogram 2D Echocardiogram has been performed.  Joshua [name] 08/29/2019, 10:36 AM

## 2019-08-29 NOTE — ED Notes (Signed)
Family at bedside. 

## 2019-08-29 NOTE — ED Triage Notes (Signed)
Lunch order

## 2019-08-29 NOTE — ED Notes (Signed)
MD at bedside- passed swallow screen and diet ordered. PT is alert and oriented. Speech remains slurred.

## 2019-08-29 NOTE — Progress Notes (Signed)
ANTICOAGULATION CONSULT NOTE   Pharmacy Consult for heparin Indication: stroke   Patient Measurements: Height: 6\' 4"  (193 cm) Weight: 264 lb (119.7 kg) IBW/kg (Calculated) : 86.8 Heparin Dosing Weight: 111.9 kg  Vital Signs: BP: 137/92 (10/05 1429) Pulse Rate: 77 (10/05 1429)  Labs: Recent Labs    08/28/19 1300 08/28/19 1316 08/29/19 0409 08/29/19 1430  HGB 12.9* 13.3 13.2  --   HCT 38.8* 39.0 39.5  --   PLT 281  --  222  --   APTT 34  --   --   --   LABPROT 13.9  --   --   --   INR 1.1  --   --   --   HEPARINUNFRC  --   --  <0.10* <0.10*  CREATININE 1.51* 1.40*  --   --       Medical History: Past Medical History:  Diagnosis Date  . Benign essential tremor   . Benign positional vertigo   . CVA (cerebral vascular accident) ([place])    x2 - L retina, 1 right parietal  . Degenerative arthritis   . Depression   . Diabetes mellitus   . Dyslipidemia   . GERD (gastroesophageal reflux disease)    hiatal hernia  . Gout   . H/O: vasectomy   . Hearing aid worn    b/l  . Hx of appendectomy   . Hx of tonsillectomy   . Hypertension   . 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 Melanoma ([place])   . Obesity   . OSA on CPAP    setting = 5  . Tremor, essential 06/22/2017  . Wears glasses     Assessment:  68 year old male admitted with aphasia. He did not receive tPA as he was outside the window. Planning to start heparin while undergoing workup. He is not on anticoagulation prior to admission. Of note, found to have intraluminal thrombus in the left subclavian artery.  Heparin level came back undetectable, on 1800 units/hr. Hgb 13.2, plt 222. No s/sx of bleeding.   Goal of Therapy:  Heparin level 0.3-0.5 units/ml Monitor platelets by anticoagulation protocol: Yes    Plan:  -Increase heparin infusion to 2100 units/hr -Order heparin level in 6-8 hr -Daily HL, CBC, s/sx of bleeding  Alanda Slim, PharmD, [facility] Clinical Pharmacist Please see AMION for all  Pharmacists' Contact Phone Numbers 08/29/2019, 3:22 PM

## 2019-08-29 NOTE — Progress Notes (Signed)
STROKE TEAM PROGRESS NOTE   INTERVAL HISTORY Wife is at bedside. Pt still has scanning speech, but no obvious ataxia. He still has difficulty of swallowing, still NPO at this time. On heparin drip now.   Vitals:   08/29/19 0200 08/29/19 0300 08/29/19 0400 08/29/19 0707  BP: (!) 161/96 (!) 145/86 139/87   Pulse: 73 76 76 80  Resp: (!) 6 14 15 16   Temp:      TempSrc:      SpO2: 96% 97% 92% 98%  Weight:      Height:        CBC:  Recent Labs  Lab 08/28/19 1300 08/28/19 1316 08/29/19 0409  WBC 8.1  --  8.1  NEUTROABS 4.1  --   --   HGB 12.9* 13.3 13.2  HCT 38.8* 39.0 39.5  MCV 89.6  --  91.2  PLT 281  --  [id number]    Basic Metabolic Panel:  Recent Labs  Lab 08/28/19 1300 08/28/19 1316  NA 140 141  K 4.2 4.2  CL 105 105  CO2 27  --   GLUCOSE 196* 198*  BUN 25* 31*  CREATININE 1.51* 1.40*  CALCIUM 9.3  --    Lipid Panel:     Component Value Date/Time   CHOL 183 08/29/2019 0409   TRIG 249 (H) 08/29/2019 0409   HDL 26 (L) 08/29/2019 0409   CHOLHDL 7.0 08/29/2019 0409   VLDL 50 (H) 08/29/2019 0409   LDLCALC 107 (H) 08/29/2019 0409   HgbA1c:  Lab Results  Component Value Date   HGBA1C 7.8 (H) 08/29/2019   Urine Drug Screen:     Component Value Date/Time   LABOPIA NONE DETECTED 08/28/2019 1413   COCAINSCRNUR NONE DETECTED 08/28/2019 1413   LABBENZ NONE DETECTED 08/28/2019 1413   AMPHETMU NONE DETECTED 08/28/2019 1413   THCU NONE DETECTED 08/28/2019 1413   LABBARB NONE DETECTED 08/28/2019 1413    Alcohol Level No results found for: [place] Code Stroke Cta Head W/wo Contrast  Addendum Date: 08/28/2019   ADDENDUM REPORT: 08/28/2019 15:19 ADDENDUM: Study reviewed by telephone with Dr. Roland Rack on 08/28/2019 at 15:15. He states that clinically he is suspicious for a small cerebellar infarct, which makes the suspected small thrombus in the proximal left subclavian artery more likely to be the dominant finding. We discussed that the left MCA middle  division M3 branch appearance might be related to streak artifact from the DBS. Electronically Signed   By: Genevie Ann M.D.   On: 08/28/2019 15:19   Result Date: 08/28/2019 CLINICAL DATA:  68 year old male with slurred speech. History of deep brain stimulator. EXAM: CT ANGIOGRAPHY HEAD AND NECK CT PERFUSION BRAIN TECHNIQUE: Multidetector CT imaging of the head and neck was performed using the standard protocol during bolus administration of intravenous contrast. Multiplanar CT image reconstructions and MIPs were obtained to evaluate the vascular anatomy. Carotid stenosis measurements (when applicable) are obtained utilizing NASCET criteria, using the distal internal carotid diameter as the denominator. Multiphase CT imaging of the brain was performed following IV bolus contrast injection. Subsequent parametric perfusion maps were calculated using RAPID software. CONTRAST:  173mL OMNIPAQUE IOHEXOL 350 MG/ML SOLN COMPARISON:  Head CT 07/07/2019 and earlier. CTA head and neck 04/09/2011. FINDINGS: CT HEAD FINDINGS Brain: Resolved postoperative pneumocephalus since last month. The left deep brain stimulator device terminates at the left thalamus as before. No acute intracranial hemorrhage identified. No midline shift, mass effect, or evidence of intracranial mass lesion. No ventriculomegaly. Evidence  of a chronic right thalamic lacunar infarct is unchanged. Patchy white matter hypodensity in both hemispheres appears stable. No cortically based acute infarct identified. Vascular: Calcified atherosclerosis at the skull base. Skull: Vertex burr holes.  No acute osseous abnormality identified. Sinuses/Orbits: Visualized paranasal sinuses and mastoids are stable and well pneumatized. Other: Left vertex approach electrical lead with removed skin staples and resolved scalp gas since the postoperative CT last month. Electrode has now been tunneled across the vertex into the right scalp and neck. Stable orbits soft tissues, with  a small preseptal infraorbital calcification or retained foreign body as before. This has been present since 2012, with 2 successful brain MRI since that time. ASPECTS [facility] Stroke Program Early CT Score) Total score (0-10 with 10 being normal): 10 Review of the MIP images confirms the above findings CT Brain Perfusion Findings: CBF (<30%) Volume: 0 mL Perfusion (Tmax>6.0s) volume: 0 mL Mismatch Volume: Not applicable Infarction Location:Not applicable CTA NECK Skeleton: No acute osseous abnormality identified. Multilevel cervical spine ankylosis in part related to flowing anterior osteophytes. Upper chest: Right chest generator device now in place. Otherwise negative. Other neck: Right neck stimulator lead tracking to the vertex, no adverse features. Otherwise stable since 2012. Aortic arch: 3 vessel arch configuration. Mild arch calcified plaque. Mild arch ectasia. Right carotid system: Tortuous brachiocephalic artery and proximal right CCA without stenosis. Calcified plaque at the right ICA origin and bulb has not significantly changed since 2012, less than 50 % stenosis with respect to the distal vessel. Highly tortuous cervical right ICA without stenosis to the skull base. Left carotid system: No left CCA origin plaque or stenosis. Calcified plaque at the left ICA origin and bulb has mildly progressed since 2012. Less than 50 % stenosis with respect to the distal vessel. Highly tortuous cervical left ICA which is mildly dolichoectatic to the skull base. Vertebral arteries: Mild plaque in the proximal right subclavian artery without stenosis. Normal right vertebral artery origin. Patent right vertebral artery to the skull base without stenosis. Right V2 tortuosity. 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 There is nearby soft and calcified plaque which appears more stable. There is no proximal left subclavian artery stenosis.  The left vertebral artery origin remains patent with mild to moderate stenosis (series 13, image 170). The left vertebral artery is tortuous, mildly dominant, and patent to the skull base without additional stenosis. Tortuous left V1 segment CTA HEAD Posterior circulation: No distal vertebral artery stenosis, the left is mildly dominant. Normal right PICA origin. Patent vertebrobasilar junction without stenosis. Patent basilar artery without stenosis. The left AICA may be dominant, unchanged. Patent SCA and PCA origins. Mild right P1 irregularity and stenosis is new on series 16, image 27. Bilateral PCA branches are otherwise stable and without significant stenosis. Anterior circulation: Both ICA siphons are patent. On the left there is mild calcified plaque without stenosis. Normal right ophthalmic artery origin. On the right there is mild to moderate calcified plaque without stenosis. Normal right ophthalmic artery origin. Posterior communicating arteries are diminutive or absent. Dominant left ACA A1 segment. MCA and left ACA origins are mildly ectatic. Diminutive right A1. Anterior communicating artery is mildly ectatic. Bilateral ACA branches are stable and within normal limits. Left MCA M1 and bifurcation are patent without stenosis. Suggestion of moderate new left MCA middle in 3 branch irregularity and stenosis since 2012. Right MCA M1 segment, bifurcation, and right MCA  branches appear patent and stable since 2012. Venous sinuses: Patent. Anatomic variants: Mildly dominant left vertebral artery. Dominant left and diminutive right ACA A1 segments. Review of the MIP images confirms the above findings IMPRESSION: 1. Negative for large vessel occlusion, and no core infarct or ischemic penumbra identified by CT Perfusion. 2. Positive for a small 5 mm focus of adherent thrombus in the proximal left subclavian artery on series 10, image 169. Nearby chronic left subclavian plaque. 3. No large vessel stenosis in the  head or neck, but suggestion of Left MCA middle division M3 branch moderate irregularity and stenosis since 2012. Unclear if this might be related to a possible recent thromboembolic event implied by #2. 4. Satisfactory postoperative CT appearance of the brain following left deep brain stimulator device in August. 5. Bilateral carotid atherosclerosis with mild progression since 2012. No significant stenosis. Study discussed by telephone with Dr. Roland Rack on 08/28/2019 at 14:30 . Electronically Signed: By: Genevie Ann M.D. On: 08/28/2019 14:31   Ct Code Stroke Cta Neck W/wo Contrast  Addendum Date: 08/28/2019   ADDENDUM REPORT: 08/28/2019 15:19 ADDENDUM: Study reviewed by telephone with Dr. Roland Rack on 08/28/2019 at 15:15. He states that clinically he is suspicious for a small cerebellar infarct, which makes the suspected small thrombus in the proximal left subclavian artery more likely to be the dominant finding. We discussed that the left MCA middle division M3 branch appearance might be related to streak artifact from the DBS. Electronically Signed   By: Genevie Ann M.D.   On: 08/28/2019 15:19   Result Date: 08/28/2019 CLINICAL DATA:  68 year old male with slurred speech. History of deep brain stimulator. EXAM: CT ANGIOGRAPHY HEAD AND NECK CT PERFUSION BRAIN TECHNIQUE: Multidetector CT imaging of the head and neck was performed using the standard protocol during bolus administration of intravenous contrast. Multiplanar CT image reconstructions and MIPs were obtained to evaluate the vascular anatomy. Carotid stenosis measurements (when applicable) are obtained utilizing NASCET criteria, using the distal internal carotid diameter as the denominator. Multiphase CT imaging of the brain was performed following IV bolus contrast injection. Subsequent parametric perfusion maps were calculated using RAPID software. CONTRAST:  123mL OMNIPAQUE IOHEXOL 350 MG/ML SOLN COMPARISON:  Head CT 07/07/2019 and  earlier. CTA head and neck 04/09/2011. FINDINGS: CT HEAD FINDINGS Brain: Resolved postoperative pneumocephalus since last month. The left deep brain stimulator device terminates at the left thalamus as before. No acute intracranial hemorrhage identified. No midline shift, mass effect, or evidence of intracranial mass lesion. No ventriculomegaly. Evidence of a chronic right thalamic lacunar infarct is unchanged. Patchy white matter hypodensity in both hemispheres appears stable. No cortically based acute infarct identified. Vascular: Calcified atherosclerosis at the skull base. Skull: Vertex burr holes.  No acute osseous abnormality identified. Sinuses/Orbits: Visualized paranasal sinuses and mastoids are stable and well pneumatized. Other: Left vertex approach electrical lead with removed skin staples and resolved scalp gas since the postoperative CT last month. Electrode has now been tunneled across the vertex into the right scalp and neck. Stable orbits soft tissues, with a small preseptal infraorbital calcification or retained foreign body as before. This has been present since 2012, with 2 successful brain MRI since that time. ASPECTS [facility] Stroke Program Early CT Score) Total score (0-10 with 10 being normal): 10 Review of the MIP images confirms the above findings CT Brain Perfusion Findings: CBF (<30%) Volume: 0 mL Perfusion (Tmax>6.0s) volume: 0 mL Mismatch Volume: Not applicable Infarction Location:Not applicable CTA NECK Skeleton: No acute  osseous abnormality identified. Multilevel cervical spine ankylosis in part related to flowing anterior osteophytes. Upper chest: Right chest generator device now in place. Otherwise negative. Other neck: Right neck stimulator lead tracking to the vertex, no adverse features. Otherwise stable since 2012. Aortic arch: 3 vessel arch configuration. Mild arch calcified plaque. Mild arch ectasia. Right carotid system: Tortuous brachiocephalic artery and proximal right CCA  without stenosis. Calcified plaque at the right ICA origin and bulb has not significantly changed since 2012, less than 50 % stenosis with respect to the distal vessel. Highly tortuous cervical right ICA without stenosis to the skull base. Left carotid system: No left CCA origin plaque or stenosis. Calcified plaque at the left ICA origin and bulb has mildly progressed since 2012. Less than 50 % stenosis with respect to the distal vessel. Highly tortuous cervical left ICA which is mildly dolichoectatic to the skull base. Vertebral arteries: Mild plaque in the proximal right subclavian artery without stenosis. Normal right vertebral artery origin. Patent right vertebral artery to the skull base without stenosis. Right V2 tortuosity. 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 There is nearby soft and calcified plaque which appears more stable. There is no proximal left subclavian artery stenosis. The left vertebral artery origin remains patent with mild to moderate stenosis (series 13, image 170). The left vertebral artery is tortuous, mildly dominant, and patent to the skull base without additional stenosis. Tortuous left V1 segment CTA HEAD Posterior circulation: No distal vertebral artery stenosis, the left is mildly dominant. Normal right PICA origin. Patent vertebrobasilar junction without stenosis. Patent basilar artery without stenosis. The left AICA may be dominant, unchanged. Patent SCA and PCA origins. Mild right P1 irregularity and stenosis is new on series 16, image 27. Bilateral PCA branches are otherwise stable and without significant stenosis. Anterior circulation: Both ICA siphons are patent. On the left there is mild calcified plaque without stenosis. Normal right ophthalmic artery origin. On the right there is mild to moderate calcified plaque without stenosis. Normal right ophthalmic artery origin. Posterior  communicating arteries are diminutive or absent. Dominant left ACA A1 segment. MCA and left ACA origins are mildly ectatic. Diminutive right A1. Anterior communicating artery is mildly ectatic. Bilateral ACA branches are stable and within normal limits. Left MCA M1 and bifurcation are patent without stenosis. Suggestion of moderate new left MCA middle in 3 branch irregularity and stenosis since 2012. Right MCA M1 segment, bifurcation, and right MCA branches appear patent and stable since 2012. Venous sinuses: Patent. Anatomic variants: Mildly dominant left vertebral artery. Dominant left and diminutive right ACA A1 segments. Review of the MIP images confirms the above findings IMPRESSION: 1. Negative for large vessel occlusion, and no core infarct or ischemic penumbra identified by CT Perfusion. 2. Positive for a small 5 mm focus of adherent thrombus in the proximal left subclavian artery on series 10, image 169. Nearby chronic left subclavian plaque. 3. No large vessel stenosis in the head or neck, but suggestion of Left MCA middle division M3 branch moderate irregularity and stenosis since 2012. Unclear if this might be related to a possible recent thromboembolic event implied by #2. 4. Satisfactory postoperative CT appearance of the brain following left deep brain stimulator device in August. 5. Bilateral carotid atherosclerosis with mild progression since 2012. No significant stenosis. Study discussed by telephone with Dr. Roland Rack on 08/28/2019 at 14:30 . Electronically Signed: By: Genevie Ann  M.D. On: 08/28/2019 14:31   Ct Code Stroke Cta Cerebral Perfusion W/wo Contrast  Addendum Date: 08/28/2019   ADDENDUM REPORT: 08/28/2019 15:19 ADDENDUM: Study reviewed by telephone with Dr. Roland Rack on 08/28/2019 at 15:15. He states that clinically he is suspicious for a small cerebellar infarct, which makes the suspected small thrombus in the proximal left subclavian artery more likely to be the  dominant finding. We discussed that the left MCA middle division M3 branch appearance might be related to streak artifact from the DBS. Electronically Signed   By: Genevie Ann M.D.   On: 08/28/2019 15:19   Result Date: 08/28/2019 CLINICAL DATA:  68 year old male with slurred speech. History of deep brain stimulator. EXAM: CT ANGIOGRAPHY HEAD AND NECK CT PERFUSION BRAIN TECHNIQUE: Multidetector CT imaging of the head and neck was performed using the standard protocol during bolus administration of intravenous contrast. Multiplanar CT image reconstructions and MIPs were obtained to evaluate the vascular anatomy. Carotid stenosis measurements (when applicable) are obtained utilizing NASCET criteria, using the distal internal carotid diameter as the denominator. Multiphase CT imaging of the brain was performed following IV bolus contrast injection. Subsequent parametric perfusion maps were calculated using RAPID software. CONTRAST:  16mL OMNIPAQUE IOHEXOL 350 MG/ML SOLN COMPARISON:  Head CT 07/07/2019 and earlier. CTA head and neck 04/09/2011. FINDINGS: CT HEAD FINDINGS Brain: Resolved postoperative pneumocephalus since last month. The left deep brain stimulator device terminates at the left thalamus as before. No acute intracranial hemorrhage identified. No midline shift, mass effect, or evidence of intracranial mass lesion. No ventriculomegaly. Evidence of a chronic right thalamic lacunar infarct is unchanged. Patchy white matter hypodensity in both hemispheres appears stable. No cortically based acute infarct identified. Vascular: Calcified atherosclerosis at the skull base. Skull: Vertex burr holes.  No acute osseous abnormality identified. Sinuses/Orbits: Visualized paranasal sinuses and mastoids are stable and well pneumatized. Other: Left vertex approach electrical lead with removed skin staples and resolved scalp gas since the postoperative CT last month. Electrode has now been tunneled across the vertex into the  right scalp and neck. Stable orbits soft tissues, with a small preseptal infraorbital calcification or retained foreign body as before. This has been present since 2012, with 2 successful brain MRI since that time. ASPECTS [facility] Stroke Program Early CT Score) Total score (0-10 with 10 being normal): 10 Review of the MIP images confirms the above findings CT Brain Perfusion Findings: CBF (<30%) Volume: 0 mL Perfusion (Tmax>6.0s) volume: 0 mL Mismatch Volume: Not applicable Infarction Location:Not applicable CTA NECK Skeleton: No acute osseous abnormality identified. Multilevel cervical spine ankylosis in part related to flowing anterior osteophytes. Upper chest: Right chest generator device now in place. Otherwise negative. Other neck: Right neck stimulator lead tracking to the vertex, no adverse features. Otherwise stable since 2012. Aortic arch: 3 vessel arch configuration. Mild arch calcified plaque. Mild arch ectasia. Right carotid system: Tortuous brachiocephalic artery and proximal right CCA without stenosis. Calcified plaque at the right ICA origin and bulb has not significantly changed since 2012, less than 50 % stenosis with respect to the distal vessel. Highly tortuous cervical right ICA without stenosis to the skull base. Left carotid system: No left CCA origin plaque or stenosis. Calcified plaque at the left ICA origin and bulb has mildly progressed since 2012. Less than 50 % stenosis with respect to the distal vessel. Highly tortuous cervical left ICA which is mildly dolichoectatic to the skull base. Vertebral arteries: Mild plaque in the proximal right subclavian artery without stenosis. Normal  right vertebral artery origin. Patent right vertebral artery to the skull base without stenosis. Right V2 tortuosity. 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 There is nearby soft and calcified plaque which appears more stable.  There is no proximal left subclavian artery stenosis. The left vertebral artery origin remains patent with mild to moderate stenosis (series 13, image 170). The left vertebral artery is tortuous, mildly dominant, and patent to the skull base without additional stenosis. Tortuous left V1 segment CTA HEAD Posterior circulation: No distal vertebral artery stenosis, the left is mildly dominant. Normal right PICA origin. Patent vertebrobasilar junction without stenosis. Patent basilar artery without stenosis. The left AICA may be dominant, unchanged. Patent SCA and PCA origins. Mild right P1 irregularity and stenosis is new on series 16, image 27. Bilateral PCA branches are otherwise stable and without significant stenosis. Anterior circulation: Both ICA siphons are patent. On the left there is mild calcified plaque without stenosis. Normal right ophthalmic artery origin. On the right there is mild to moderate calcified plaque without stenosis. Normal right ophthalmic artery origin. Posterior communicating arteries are diminutive or absent. Dominant left ACA A1 segment. MCA and left ACA origins are mildly ectatic. Diminutive right A1. Anterior communicating artery is mildly ectatic. Bilateral ACA branches are stable and within normal limits. Left MCA M1 and bifurcation are patent without stenosis. Suggestion of moderate new left MCA middle in 3 branch irregularity and stenosis since 2012. Right MCA M1 segment, bifurcation, and right MCA branches appear patent and stable since 2012. Venous sinuses: Patent. Anatomic variants: Mildly dominant left vertebral artery. Dominant left and diminutive right ACA A1 segments. Review of the MIP images confirms the above findings IMPRESSION: 1. Negative for large vessel occlusion, and no core infarct or ischemic penumbra identified by CT Perfusion. 2. Positive for a small 5 mm focus of adherent thrombus in the proximal left subclavian artery on series 10, image 169. Nearby chronic left  subclavian plaque. 3. No large vessel stenosis in the head or neck, but suggestion of Left MCA middle division M3 branch moderate irregularity and stenosis since 2012. Unclear if this might be related to a possible recent thromboembolic event implied by #2. 4. Satisfactory postoperative CT appearance of the brain following left deep brain stimulator device in August. 5. Bilateral carotid atherosclerosis with mild progression since 2012. No significant stenosis. Study discussed by telephone with Dr. Roland Rack on 08/28/2019 at 14:30 . Electronically Signed: By: Genevie Ann M.D. On: 08/28/2019 14:31    PHYSICAL EXAM  Pulse Rate:  [72-85] 84 (10/05 1231) Resp:  [6-20] 17 (10/05 1231) BP: (133-195)/(59-106) 159/93 (10/05 1145) SpO2:  [92 %-100 %] 95 % (10/05 1231)  General - Well nourished, well developed, in no apparent distress.  Ophthalmologic - fundi not visualized due to noncooperation.  Cardiovascular - Regular rhythm and rate.  Mental Status -  Level of arousal and orientation to time, place, and person were intact. Language including expression, naming, repetition, comprehension was assessed and found intact. Fund of Knowledge was assessed and was intact. However, scanning speech  Cranial Nerves II - XII - II - Visual field intact OU. III, IV, VI - Extraocular movements intact. V - Facial sensation intact bilaterally. VII - Facial movement intact bilaterally. VIII - Hearing & vestibular intact bilaterally. X - Palate elevates symmetrically. Scanning speech XI - Chin turning & shoulder shrug intact bilaterally. XII - Tongue protrusion intact.  Motor  Strength - The patient's strength was normal in all extremities and pronator drift was absent.  Bulk was normal and fasciculations were absent.   Motor Tone - Muscle tone was assessed at the neck and appendages and was normal.  Reflexes - The patient's reflexes were symmetrical in all extremities and he had no pathological  reflexes.  Sensory - Light touch, temperature/pinprick were assessed and were symmetrical.    Coordination - The patient had normal movements in the hands and feet with no obvious ataxia or dysmetria.  Mild essential was present more on the right hand, of note, his DBS is turned on at this time.  Gait and Station - deferred.   ASSESSMENT/PLAN Mr. CHRISTOPER NOGUERAS is a 68 y.o. male with history of HTN, HLD, DB presenting with dysarthria, mild Larm>L leg ataxia.   Stroke:  possible left cerebellar vs. Left lateral medullary infarct, likely embolic secondary to L subclavian thrombus  CT head L thalamic deep brain stimulator in place. No hemorrhage. Moderate to moderate penrumocephalus anteriorly bilaterally. Chronic ischemic changes.   CTA head & neck no ELVO. suspected thrombus proximal L subclavian. L DBS intact. B ICA atherosclerosis with mild progression since 2012.   CT perfusion no core infarct or penumbra  CT repeat in am  Not able to have MRI DWI due to DBS  2D Echo EF 55-60%. No source of embolus   LDL 107  HgbA1c 7.8  IV heparinfor VTE prophylaxis  aspirin 81 mg daily prior to admission, now on heparin IV. If repeat CT no large stroke, pt tolerating heparin IV for 2-3 days, can consider switch to po anticoagulation. Recommend to continue anticoagulation for 2-3 months and then repeat CTA neck, if subclavian artery thrombus resolves, can switch to antiplatelet.   Therapy recommendations:  pending   Disposition:  pending   Follow up with Dr. Carles Collet at d/c for neuro care  L Subclavian Artery Thrombus  [facility] consulted Carlis Abbott)  CTA head & neck  suspected thrombus proximal L subclavian.   On IV heparin  Not sure source of thrombus, cardioembolic vs. In situ  Recommend 30 day cardiac event monitoring as outpt to rule out afib  Hypertension  Stable . Permissive hypertension (OK if < 180/105) but gradually normalize in 5-7 days . Long-term BP goal  normotensive  Hyperlipidemia  Home meds:  liptor 40 and fish oil  Now on lipitor 80   resumed in hospital  LDL 107, goal < 70  Continue statin at discharge  Diabetes type II Uncontrolled  HgbA1c 7.8, goal < 7.0  CBGs  SSI  Close PCP follow up  Other Stroke Risk Factors  Advanced age  Former Cigarette smoker, quit 35 yrs ago  ETOH use,  advised to drink no more than 2 drink(s) a day  Obesity, Body mass index is 32.14 kg/m., recommend weight loss, diet and exercise as appropriate   Hx stroke/TIA  03/2011 - small R parasagittal frontal white matter infarct / corpus callosum   Obstructive sleep apnea, on CPAP at home  Other Active Problems  Essential tremors s/p DBS 05/07/2019 - Followed by Dr. Carles Collet  CKD  Hospital day # 1  [name], MD PhD Stroke Neurology 08/29/2019 1:38 PM    To contact Stroke Continuity provider, please refer to [email or web address] After hours, contact General Neurology

## 2019-08-29 NOTE — Telephone Encounter (Signed)
I called pt's phone and pt wife, [name] states pt is scheduled to return to our office 09/01/2019 for suture removal but he is in the ED for a stroke and blood clot. I told [name] that the sutures could stay in 7-21 days so he would have time to reschedule once he was out of the hospital and I would inform Dr. Jacqualyn Posey.

## 2019-08-29 NOTE — Progress Notes (Signed)
PROGRESS NOTE    Joshua [name]  [id number] DOB: [birth year] DOA: 08/28/2019 PCP: [name], MD   Brief Narrative: Joshua [name] is a 68 y.o. male with medical history significant of hypertension, hyperlipidemia, diabetes mellitus, obstructive sleep apnea on CPAP, essential tremors status post deep brain stimulator placement on 05/07/2019, obesity, CVA. Patient presented secondary to dysarthria, weakness and nausea concerning for stroke.   Assessment & Plan:   Principal Problem:   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HCC) Active Problems:   Diabetes mellitus type 2 in obese (HCC)   GERD   OSA on CPAP   Essential hypertension   Essential tremor   Hyperlipidemia   Chronic kidney disease   Obesity (BMI 30-39.9)   Cerebral embolism with cerebral infarction   Dysarthria Concern for stroke. Neurology on board. Unable to obtain MRI. Hemoglobin A1C of 7.8%. LDL of 107. Transthoracic Echocardiogram unremarkable for embolic source. -Neurology recommendations: Repeat CT in AM  Subclavian artery thrombosis Vascular surgery consulted and recommending full dose anticoagulation. Patient started on heparin drip -Continue Heparin drip -Neurology recommendations: anticoagulation for 2-3 months then repeat neck, 30 day cardiac event monitor outpatient  Asymmetric ventricular hypertrophy Seen on Transthoracic Echocardiogram. Will need Cardiac MRI for follow-up.  Essential hypertension Elevated. Patient is on amlodipine, hydrochlorothiazide, losartan as an outpatient -Hold antihypertensives  Diabetes mellitus, type 2 Hemoglobin A1C of 7.8%. patient is on Toujeo, Humalog as an outpatient. -Hold Lantus since patient's blood sugar in the 90s -Continue SSI  Hyperlipidemia -Continue atorvastatin  Essential tremor Patient is s/p deep brain stimulator placement earlier this year in June of 2020.  OSA On CPAP -Continue CPAP qhs  CKD stage 3 -Stable   DVT prophylaxis: Heparin  drip Code Status:   Code Status: Full Code Family Communication: None at bedside Disposition Plan: Discharge pending neuro workup and transition to oral anticoagulation   Consultants:   Neurology  Vascular surgery  Procedures:   10/5: Transthoracic Echocardiogram IMPRESSIONS    1. Technically difficult study. Left ventricular ejection fraction appears grossly normal, approximately 55-60%, though difficult visualization even with contrast  2. There is asymmetric basal septal hypertrophy measuring 18 mm in basal septum (12 mm posterior wall). Consider cardiac MRI to assess for hypertrophic cardiomyopathy if clinically indicated  3. Definity contrast agent was given IV to delineate the left ventricular endocardial borders.  4. Global right ventricle has normal systolic function.The right ventricular size is normal. No increase in right ventricular wall thickness.  5. There is mild dilatation of the aortic root measuring 39 mm.  6. The inferior vena cava is dilated in size with <50% respiratory variability, suggesting right atrial pressure of 15 mmHg.  FINDINGS  Left Ventricle: Left ventricular ejection fraction, by visual estimation, is 55 to 60%. The left ventricle has normal function. Definity contrast agent was given IV to delineate the left ventricular endocardial borders. There is moderately increased  left ventricular hypertrophy. Asymmetric left ventricular hypertrophy. Spectral Doppler shows Left ventricular diastolic Doppler parameters are indeterminate pattern of LV diastolic filling.  Right Ventricle: The right ventricular size is normal. No increase in right ventricular wall thickness. Global RV systolic function is has normal systolic function.  Left Atrium: Left atrial size was not well visualized.  Right Atrium: Right atrial size was not well visualized  Pericardium: Trivial pericardial effusion is present.  Mitral Valve: The mitral valve is normal in  structure. No evidence of mitral valve regurgitation.  Tricuspid Valve: The tricuspid valve is not well visualized. Tricuspid valve regurgitation is  trivial by color flow Doppler.  Aortic Valve: The aortic valve was not well visualized. Aortic valve regurgitation was not visualized by color flow Doppler. Mild calcification.  Pulmonic Valve: The pulmonic valve was not well visualized. Pulmonic valve regurgitation is not visualized by color flow Doppler.  Aorta: Aortic dilatation noted. There is mild dilatation of the aortic root measuring 39 mm.  Venous: The inferior vena cava is dilated in size with less than 50% respiratory variability, suggesting right atrial pressure of 15 mmHg.  IAS/Shunts: The interatrial septum was not well visualized.  Antimicrobials:  None    Subjective: No issues this morning.  Objective: Vitals:   08/29/19 1015 08/29/19 1100 08/29/19 1145 08/29/19 1231  BP: (!) 151/95 (!) 153/75 (!) 159/93   Pulse: 72 72 77 84  Resp: 14 13 14 17   Temp:      TempSrc:      SpO2: 98% 98% 100% 95%  Weight:      Height:        Intake/Output Summary (Last 24 hours) at 08/29/2019 1431 Last data filed at 08/29/2019 1426 Gross per 24 hour  Intake 1083.91 ml  Output 650 ml  Net 433.91 ml   Filed Weights   08/28/19 1246  Weight: 119.7 kg    Examination:  General exam: Appears calm and comfortable Respiratory system: Clear to auscultation. Respiratory effort normal. Cardiovascular system: S1 & S2 heard, RRR. No murmurs, rubs, gallops or clicks. Gastrointestinal system: Abdomen is nondistended, soft and nontender. No organomegaly or masses felt. Normal bowel sounds heard. Central nervous system: Alert and oriented. Some mild left sided weakness Extremities: No edema. No calf tenderness Skin: No cyanosis. No rashes Psychiatry: Judgement and insight appear normal. Mood & affect appropriate.     Data Reviewed: I have personally reviewed following labs and  imaging studies  CBC: Recent Labs  Lab 08/28/19 1300 08/28/19 1316 08/29/19 0409  WBC 8.1  --  8.1  NEUTROABS 4.1  --   --   HGB 12.9* 13.3 13.2  HCT 38.8* 39.0 39.5  MCV 89.6  --  91.2  PLT 281  --  [id number]   Basic Metabolic Panel: Recent Labs  Lab 08/28/19 1300 08/28/19 1316  NA 140 141  K 4.2 4.2  CL 105 105  CO2 27  --   GLUCOSE 196* 198*  BUN 25* 31*  CREATININE 1.51* 1.40*  CALCIUM 9.3  --    GFR: Estimated Creatinine Clearance: 69.4 mL/min (A) (by C-G formula based on SCr of 1.4 mg/dL (H)). Liver Function Tests: Recent Labs  Lab 08/28/19 1300  AST 22  ALT 16  ALKPHOS 81  BILITOT 0.5  PROT 6.7  ALBUMIN 3.3*   No results for input(s): LIPASE, AMYLASE in the last 168 hours. No results for input(s): AMMONIA in the last 168 hours. Coagulation Profile: Recent Labs  Lab 08/28/19 1300  INR 1.1   Cardiac Enzymes: No results for input(s): CKTOTAL, CKMB, CKMBINDEX, TROPONINI in the last 168 hours. BNP (last 3 results) No results for input(s): PROBNP in the last 8760 hours. HbA1C: Recent Labs    08/29/19 0409  HGBA1C 7.8*   CBG: Recent Labs  Lab 08/28/19 1300 08/28/19 1928 08/28/19 2212 08/29/19 1315  GLUCAP 181* 122* 103* 92   Lipid Profile: Recent Labs    08/29/19 0409  CHOL 183  HDL 26*  LDLCALC 107*  TRIG 249*  CHOLHDL 7.0   Thyroid Function Tests: No results for input(s): TSH, T4TOTAL, FREET4, T3FREE, THYROIDAB in the last  72 hours. Anemia Panel: No results for input(s): VITAMINB12, FOLATE, FERRITIN, TIBC, IRON, RETICCTPCT in the last 72 hours. Sepsis Labs: No results for input(s): PROCALCITON, LATICACIDVEN in the last 168 hours.  Recent Results (from the past 240 hour(s))  SARS CORONAVIRUS 2 (TAT 6-24 HRS) Nasopharyngeal Urine, Clean Catch     Status: None   Collection Time: 08/28/19  2:13 PM   Specimen: Urine, Clean Catch; Nasopharyngeal  Result Value Ref Range Status   SARS Coronavirus 2 NEGATIVE NEGATIVE Final    Comment:  (NOTE) SARS-CoV-2 target nucleic acids are NOT DETECTED. The SARS-CoV-2 RNA is generally detectable in upper and lower respiratory specimens during the acute phase of infection. Negative results do not preclude SARS-CoV-2 infection, do not rule out co-infections with other pathogens, and should not be used as the sole basis for treatment or other patient management decisions. Negative results must be combined with clinical observations, patient history, and epidemiological information. The expected result is Negative. Fact Sheet for Patients: SugarRoll.be Fact Sheet for Healthcare Providers: https://www.woods-mathews.com/ This test is not yet approved or cleared by the [place] FDA and  has been authorized for detection and/or diagnosis of SARS-CoV-2 by FDA under an Emergency Use Authorization (EUA). This EUA will remain  in effect (meaning this test can be used) for the duration of the COVID-19 declaration under Section 56 4(b)(1) of the Act, 21 U.S.C. section 360bbb-3(b)(1), unless the authorization is terminated or revoked sooner. Performed at [facility], [place] [address], [place], [place] [address]          Radiology Studies: Ct Code Stroke Cta Head W/wo Contrast  Addendum Date: 08/28/2019   ADDENDUM REPORT: 08/28/2019 15:19 ADDENDUM: Study reviewed by telephone with Dr. Roland Rack on 08/28/2019 at 15:15. He states that clinically he is suspicious for a small cerebellar infarct, which makes the suspected small thrombus in the proximal left subclavian artery more likely to be the dominant finding. We discussed that the left MCA middle division M3 branch appearance might be related to streak artifact from the DBS. Electronically Signed   By: Genevie Ann M.D.   On: 08/28/2019 15:19   Result Date: 08/28/2019 CLINICAL DATA:  68 year old male with slurred speech. History of deep brain stimulator. EXAM: CT ANGIOGRAPHY HEAD AND  NECK CT PERFUSION BRAIN TECHNIQUE: Multidetector CT imaging of the head and neck was performed using the standard protocol during bolus administration of intravenous contrast. Multiplanar CT image reconstructions and MIPs were obtained to evaluate the vascular anatomy. Carotid stenosis measurements (when applicable) are obtained utilizing NASCET criteria, using the distal internal carotid diameter as the denominator. Multiphase CT imaging of the brain was performed following IV bolus contrast injection. Subsequent parametric perfusion maps were calculated using RAPID software. CONTRAST:  158mL OMNIPAQUE IOHEXOL 350 MG/ML SOLN COMPARISON:  Head CT 07/07/2019 and earlier. CTA head and neck 04/09/2011. FINDINGS: CT HEAD FINDINGS Brain: Resolved postoperative pneumocephalus since last month. The left deep brain stimulator device terminates at the left thalamus as before. No acute intracranial hemorrhage identified. No midline shift, mass effect, or evidence of intracranial mass lesion. No ventriculomegaly. Evidence of a chronic right thalamic lacunar infarct is unchanged. Patchy white matter hypodensity in both hemispheres appears stable. No cortically based acute infarct identified. Vascular: Calcified atherosclerosis at the skull base. Skull: Vertex burr holes.  No acute osseous abnormality identified. Sinuses/Orbits: Visualized paranasal sinuses and mastoids are stable and well pneumatized. Other: Left vertex approach electrical lead with removed skin staples and resolved scalp gas since  the postoperative CT last month. Electrode has now been tunneled across the vertex into the right scalp and neck. Stable orbits soft tissues, with a small preseptal infraorbital calcification or retained foreign body as before. This has been present since 2012, with 2 successful brain MRI since that time. ASPECTS [facility] Stroke Program Early CT Score) Total score (0-10 with 10 being normal): 10 Review of the MIP images confirms the  above findings CT Brain Perfusion Findings: CBF (<30%) Volume: 0 mL Perfusion (Tmax>6.0s) volume: 0 mL Mismatch Volume: Not applicable Infarction Location:Not applicable CTA NECK Skeleton: No acute osseous abnormality identified. Multilevel cervical spine ankylosis in part related to flowing anterior osteophytes. Upper chest: Right chest generator device now in place. Otherwise negative. Other neck: Right neck stimulator lead tracking to the vertex, no adverse features. Otherwise stable since 2012. Aortic arch: 3 vessel arch configuration. Mild arch calcified plaque. Mild arch ectasia. Right carotid system: Tortuous brachiocephalic artery and proximal right CCA without stenosis. Calcified plaque at the right ICA origin and bulb has not significantly changed since 2012, less than 50 % stenosis with respect to the distal vessel. Highly tortuous cervical right ICA without stenosis to the skull base. Left carotid system: No left CCA origin plaque or stenosis. Calcified plaque at the left ICA origin and bulb has mildly progressed since 2012. Less than 50 % stenosis with respect to the distal vessel. Highly tortuous cervical left ICA which is mildly dolichoectatic to the skull base. Vertebral arteries: Mild plaque in the proximal right subclavian artery without stenosis. Normal right vertebral artery origin. Patent right vertebral artery to the skull base without stenosis. Right V2 tortuosity. 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 There is nearby soft and calcified plaque which appears more stable. There is no proximal left subclavian artery stenosis. The left vertebral artery origin remains patent with mild to moderate stenosis (series 13, image 170). The left vertebral artery is tortuous, mildly dominant, and patent to the skull base without additional stenosis. Tortuous left V1 segment CTA HEAD Posterior circulation: No distal  vertebral artery stenosis, the left is mildly dominant. Normal right PICA origin. Patent vertebrobasilar junction without stenosis. Patent basilar artery without stenosis. The left AICA may be dominant, unchanged. Patent SCA and PCA origins. Mild right P1 irregularity and stenosis is new on series 16, image 27. Bilateral PCA branches are otherwise stable and without significant stenosis. Anterior circulation: Both ICA siphons are patent. On the left there is mild calcified plaque without stenosis. Normal right ophthalmic artery origin. On the right there is mild to moderate calcified plaque without stenosis. Normal right ophthalmic artery origin. Posterior communicating arteries are diminutive or absent. Dominant left ACA A1 segment. MCA and left ACA origins are mildly ectatic. Diminutive right A1. Anterior communicating artery is mildly ectatic. Bilateral ACA branches are stable and within normal limits. Left MCA M1 and bifurcation are patent without stenosis. Suggestion of moderate new left MCA middle in 3 branch irregularity and stenosis since 2012. Right MCA M1 segment, bifurcation, and right MCA branches appear patent and stable since 2012. Venous sinuses: Patent. Anatomic variants: Mildly dominant left vertebral artery. Dominant left and diminutive right ACA A1 segments. Review of the MIP images confirms the above findings IMPRESSION: 1. Negative for large vessel occlusion, and no core infarct or ischemic penumbra identified by CT Perfusion. 2. Positive for a small 5 mm focus of adherent thrombus in the proximal left  subclavian artery on series 10, image 169. Nearby chronic left subclavian plaque. 3. No large vessel stenosis in the head or neck, but suggestion of Left MCA middle division M3 branch moderate irregularity and stenosis since 2012. Unclear if this might be related to a possible recent thromboembolic event implied by #2. 4. Satisfactory postoperative CT appearance of the brain following left deep  brain stimulator device in August. 5. Bilateral carotid atherosclerosis with mild progression since 2012. No significant stenosis. Study discussed by telephone with Dr. Roland Rack on 08/28/2019 at 14:30 . Electronically Signed: By: Genevie Ann M.D. On: 08/28/2019 14:31   Ct Code Stroke Cta Neck W/wo Contrast  Addendum Date: 08/28/2019   ADDENDUM REPORT: 08/28/2019 15:19 ADDENDUM: Study reviewed by telephone with Dr. Roland Rack on 08/28/2019 at 15:15. He states that clinically he is suspicious for a small cerebellar infarct, which makes the suspected small thrombus in the proximal left subclavian artery more likely to be the dominant finding. We discussed that the left MCA middle division M3 branch appearance might be related to streak artifact from the DBS. Electronically Signed   By: Genevie Ann M.D.   On: 08/28/2019 15:19   Result Date: 08/28/2019 CLINICAL DATA:  68 year old male with slurred speech. History of deep brain stimulator. EXAM: CT ANGIOGRAPHY HEAD AND NECK CT PERFUSION BRAIN TECHNIQUE: Multidetector CT imaging of the head and neck was performed using the standard protocol during bolus administration of intravenous contrast. Multiplanar CT image reconstructions and MIPs were obtained to evaluate the vascular anatomy. Carotid stenosis measurements (when applicable) are obtained utilizing NASCET criteria, using the distal internal carotid diameter as the denominator. Multiphase CT imaging of the brain was performed following IV bolus contrast injection. Subsequent parametric perfusion maps were calculated using RAPID software. CONTRAST:  195mL OMNIPAQUE IOHEXOL 350 MG/ML SOLN COMPARISON:  Head CT 07/07/2019 and earlier. CTA head and neck 04/09/2011. FINDINGS: CT HEAD FINDINGS Brain: Resolved postoperative pneumocephalus since last month. The left deep brain stimulator device terminates at the left thalamus as before. No acute intracranial hemorrhage identified. No midline shift, mass effect,  or evidence of intracranial mass lesion. No ventriculomegaly. Evidence of a chronic right thalamic lacunar infarct is unchanged. Patchy white matter hypodensity in both hemispheres appears stable. No cortically based acute infarct identified. Vascular: Calcified atherosclerosis at the skull base. Skull: Vertex burr holes.  No acute osseous abnormality identified. Sinuses/Orbits: Visualized paranasal sinuses and mastoids are stable and well pneumatized. Other: Left vertex approach electrical lead with removed skin staples and resolved scalp gas since the postoperative CT last month. Electrode has now been tunneled across the vertex into the right scalp and neck. Stable orbits soft tissues, with a small preseptal infraorbital calcification or retained foreign body as before. This has been present since 2012, with 2 successful brain MRI since that time. ASPECTS [facility] Stroke Program Early CT Score) Total score (0-10 with 10 being normal): 10 Review of the MIP images confirms the above findings CT Brain Perfusion Findings: CBF (<30%) Volume: 0 mL Perfusion (Tmax>6.0s) volume: 0 mL Mismatch Volume: Not applicable Infarction Location:Not applicable CTA NECK Skeleton: No acute osseous abnormality identified. Multilevel cervical spine ankylosis in part related to flowing anterior osteophytes. Upper chest: Right chest generator device now in place. Otherwise negative. Other neck: Right neck stimulator lead tracking to the vertex, no adverse features. Otherwise stable since 2012. Aortic arch: 3 vessel arch configuration. Mild arch calcified plaque. Mild arch ectasia. Right carotid system: Tortuous brachiocephalic artery and proximal right CCA without stenosis.  Calcified plaque at the right ICA origin and bulb has not significantly changed since 2012, less than 50 % stenosis with respect to the distal vessel. Highly tortuous cervical right ICA without stenosis to the skull base. Left carotid system: No left CCA origin plaque  or stenosis. Calcified plaque at the left ICA origin and bulb has mildly progressed since 2012. Less than 50 % stenosis with respect to the distal vessel. Highly tortuous cervical left ICA which is mildly dolichoectatic to the skull base. Vertebral arteries: Mild plaque in the proximal right subclavian artery without stenosis. Normal right vertebral artery origin. Patent right vertebral artery to the skull base without stenosis. Right V2 tortuosity. 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 There is nearby soft and calcified plaque which appears more stable. There is no proximal left subclavian artery stenosis. The left vertebral artery origin remains patent with mild to moderate stenosis (series 13, image 170). The left vertebral artery is tortuous, mildly dominant, and patent to the skull base without additional stenosis. Tortuous left V1 segment CTA HEAD Posterior circulation: No distal vertebral artery stenosis, the left is mildly dominant. Normal right PICA origin. Patent vertebrobasilar junction without stenosis. Patent basilar artery without stenosis. The left AICA may be dominant, unchanged. Patent SCA and PCA origins. Mild right P1 irregularity and stenosis is new on series 16, image 27. Bilateral PCA branches are otherwise stable and without significant stenosis. Anterior circulation: Both ICA siphons are patent. On the left there is mild calcified plaque without stenosis. Normal right ophthalmic artery origin. On the right there is mild to moderate calcified plaque without stenosis. Normal right ophthalmic artery origin. Posterior communicating arteries are diminutive or absent. Dominant left ACA A1 segment. MCA and left ACA origins are mildly ectatic. Diminutive right A1. Anterior communicating artery is mildly ectatic. Bilateral ACA branches are stable and within normal limits. Left MCA M1 and bifurcation are patent  without stenosis. Suggestion of moderate new left MCA middle in 3 branch irregularity and stenosis since 2012. Right MCA M1 segment, bifurcation, and right MCA branches appear patent and stable since 2012. Venous sinuses: Patent. Anatomic variants: Mildly dominant left vertebral artery. Dominant left and diminutive right ACA A1 segments. Review of the MIP images confirms the above findings IMPRESSION: 1. Negative for large vessel occlusion, and no core infarct or ischemic penumbra identified by CT Perfusion. 2. Positive for a small 5 mm focus of adherent thrombus in the proximal left subclavian artery on series 10, image 169. Nearby chronic left subclavian plaque. 3. No large vessel stenosis in the head or neck, but suggestion of Left MCA middle division M3 branch moderate irregularity and stenosis since 2012. Unclear if this might be related to a possible recent thromboembolic event implied by #2. 4. Satisfactory postoperative CT appearance of the brain following left deep brain stimulator device in August. 5. Bilateral carotid atherosclerosis with mild progression since 2012. No significant stenosis. Study discussed by telephone with Dr. Roland Rack on 08/28/2019 at 14:30 . Electronically Signed: By: Genevie Ann M.D. On: 08/28/2019 14:31   Ct Code Stroke Cta Cerebral Perfusion W/wo Contrast  Addendum Date: 08/28/2019   ADDENDUM REPORT: 08/28/2019 15:19 ADDENDUM: Study reviewed by telephone with Dr. Roland Rack on 08/28/2019 at 15:15. He states that clinically he is suspicious for a small cerebellar infarct, which makes the suspected small thrombus in the proximal left subclavian artery more likely to be the dominant  finding. We discussed that the left MCA middle division M3 branch appearance might be related to streak artifact from the DBS. Electronically Signed   By: Genevie Ann M.D.   On: 08/28/2019 15:19   Result Date: 08/28/2019 CLINICAL DATA:  68 year old male with slurred speech. History of deep  brain stimulator. EXAM: CT ANGIOGRAPHY HEAD AND NECK CT PERFUSION BRAIN TECHNIQUE: Multidetector CT imaging of the head and neck was performed using the standard protocol during bolus administration of intravenous contrast. Multiplanar CT image reconstructions and MIPs were obtained to evaluate the vascular anatomy. Carotid stenosis measurements (when applicable) are obtained utilizing NASCET criteria, using the distal internal carotid diameter as the denominator. Multiphase CT imaging of the brain was performed following IV bolus contrast injection. Subsequent parametric perfusion maps were calculated using RAPID software. CONTRAST:  1105mL OMNIPAQUE IOHEXOL 350 MG/ML SOLN COMPARISON:  Head CT 07/07/2019 and earlier. CTA head and neck 04/09/2011. FINDINGS: CT HEAD FINDINGS Brain: Resolved postoperative pneumocephalus since last month. The left deep brain stimulator device terminates at the left thalamus as before. No acute intracranial hemorrhage identified. No midline shift, mass effect, or evidence of intracranial mass lesion. No ventriculomegaly. Evidence of a chronic right thalamic lacunar infarct is unchanged. Patchy white matter hypodensity in both hemispheres appears stable. No cortically based acute infarct identified. Vascular: Calcified atherosclerosis at the skull base. Skull: Vertex burr holes.  No acute osseous abnormality identified. Sinuses/Orbits: Visualized paranasal sinuses and mastoids are stable and well pneumatized. Other: Left vertex approach electrical lead with removed skin staples and resolved scalp gas since the postoperative CT last month. Electrode has now been tunneled across the vertex into the right scalp and neck. Stable orbits soft tissues, with a small preseptal infraorbital calcification or retained foreign body as before. This has been present since 2012, with 2 successful brain MRI since that time. ASPECTS [facility] Stroke Program Early CT Score) Total score (0-10 with 10 being  normal): 10 Review of the MIP images confirms the above findings CT Brain Perfusion Findings: CBF (<30%) Volume: 0 mL Perfusion (Tmax>6.0s) volume: 0 mL Mismatch Volume: Not applicable Infarction Location:Not applicable CTA NECK Skeleton: No acute osseous abnormality identified. Multilevel cervical spine ankylosis in part related to flowing anterior osteophytes. Upper chest: Right chest generator device now in place. Otherwise negative. Other neck: Right neck stimulator lead tracking to the vertex, no adverse features. Otherwise stable since 2012. Aortic arch: 3 vessel arch configuration. Mild arch calcified plaque. Mild arch ectasia. Right carotid system: Tortuous brachiocephalic artery and proximal right CCA without stenosis. Calcified plaque at the right ICA origin and bulb has not significantly changed since 2012, less than 50 % stenosis with respect to the distal vessel. Highly tortuous cervical right ICA without stenosis to the skull base. Left carotid system: No left CCA origin plaque or stenosis. Calcified plaque at the left ICA origin and bulb has mildly progressed since 2012. Less than 50 % stenosis with respect to the distal vessel. Highly tortuous cervical left ICA which is mildly dolichoectatic to the skull base. Vertebral arteries: Mild plaque in the proximal right subclavian artery without stenosis. Normal right vertebral artery origin. Patent right vertebral artery to the skull base without stenosis. Right V2 tortuosity. 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 There is nearby soft and calcified plaque which appears more stable. There is no proximal left subclavian artery stenosis. The left vertebral artery origin remains patent with mild  to moderate stenosis (series 13, image 170). The left vertebral artery is tortuous, mildly dominant, and patent to the skull base without additional stenosis. Tortuous left V1 segment  CTA HEAD Posterior circulation: No distal vertebral artery stenosis, the left is mildly dominant. Normal right PICA origin. Patent vertebrobasilar junction without stenosis. Patent basilar artery without stenosis. The left AICA may be dominant, unchanged. Patent SCA and PCA origins. Mild right P1 irregularity and stenosis is new on series 16, image 27. Bilateral PCA branches are otherwise stable and without significant stenosis. Anterior circulation: Both ICA siphons are patent. On the left there is mild calcified plaque without stenosis. Normal right ophthalmic artery origin. On the right there is mild to moderate calcified plaque without stenosis. Normal right ophthalmic artery origin. Posterior communicating arteries are diminutive or absent. Dominant left ACA A1 segment. MCA and left ACA origins are mildly ectatic. Diminutive right A1. Anterior communicating artery is mildly ectatic. Bilateral ACA branches are stable and within normal limits. Left MCA M1 and bifurcation are patent without stenosis. Suggestion of moderate new left MCA middle in 3 branch irregularity and stenosis since 2012. Right MCA M1 segment, bifurcation, and right MCA branches appear patent and stable since 2012. Venous sinuses: Patent. Anatomic variants: Mildly dominant left vertebral artery. Dominant left and diminutive right ACA A1 segments. Review of the MIP images confirms the above findings IMPRESSION: 1. Negative for large vessel occlusion, and no core infarct or ischemic penumbra identified by CT Perfusion. 2. Positive for a small 5 mm focus of adherent thrombus in the proximal left subclavian artery on series 10, image 169. Nearby chronic left subclavian plaque. 3. No large vessel stenosis in the head or neck, but suggestion of Left MCA middle division M3 branch moderate irregularity and stenosis since 2012. Unclear if this might be related to a possible recent thromboembolic event implied by #2. 4. Satisfactory postoperative CT  appearance of the brain following left deep brain stimulator device in August. 5. Bilateral carotid atherosclerosis with mild progression since 2012. No significant stenosis. Study discussed by telephone with Dr. Roland Rack on 08/28/2019 at 14:30 . Electronically Signed: By: Genevie Ann M.D. On: 08/28/2019 14:31        Scheduled Meds:   stroke: mapping our early stages of recovery book   Does not apply Once   atorvastatin  80 mg Oral q1800   insulin aspart  0-15 Units Subcutaneous TID WC   insulin aspart  0-5 Units Subcutaneous QHS   Continuous Infusions:  sodium chloride Stopped (08/28/19 2003)   sodium chloride 75 mL/hr at 08/29/19 0444   heparin Stopped (08/29/19 1408)     LOS: 1 day     Cordelia Poche, MD [facility] 08/29/2019, 2:31 PM  If 7PM-7AM, please contact night-coverage [email or web address]

## 2019-08-29 NOTE — Telephone Encounter (Signed)
Patient called to cancel this Thursday's appt.(Metatarsal R5 Resect), due to having a stroke yesterday. Will be in the hospital for the next few days. Wanted to speak to the nurse when available.

## 2019-08-29 NOTE — ED Notes (Signed)
Echo at bedside

## 2019-08-29 NOTE — Progress Notes (Signed)
Contacted RN to inform her that MRI is waiting to hear back from rep concerning pt DBS.  Also informed RN that pt needs to have a fully charged system for the exam. Pt should have a controller that is charged as well.  RN made aware.

## 2019-08-30 ENCOUNTER — Inpatient Hospital Stay (HOSPITAL_COMMUNITY): Payer: Medicare Other

## 2019-08-30 ENCOUNTER — Encounter: Payer: Self-pay | Admitting: Podiatry

## 2019-08-30 LAB — CBC
HCT: 35.8 % — ABNORMAL LOW (ref 39.0–52.0)
Hemoglobin: 11.9 g/dL — ABNORMAL LOW (ref 13.0–17.0)
MCH: 29.8 pg (ref 26.0–34.0)
MCHC: 33.2 g/dL (ref 30.0–36.0)
MCV: 89.5 fL (ref 80.0–100.0)
Platelets: 264 10*3/uL (ref 150–400)
RBC: 4 MIL/uL — ABNORMAL LOW (ref 4.22–5.81)
RDW: 12.9 % (ref 11.5–15.5)
WBC: 7.7 10*3/uL (ref 4.0–10.5)
nRBC: 0 % (ref 0.0–0.2)

## 2019-08-30 LAB — GLUCOSE, CAPILLARY
Glucose-Capillary: 161 mg/dL — ABNORMAL HIGH (ref 70–99)
Glucose-Capillary: 200 mg/dL — ABNORMAL HIGH (ref 70–99)
Glucose-Capillary: 220 mg/dL — ABNORMAL HIGH (ref 70–99)
Glucose-Capillary: 192 mg/dL — ABNORMAL HIGH (ref 70–99)

## 2019-08-30 LAB — HEPARIN LEVEL (UNFRACTIONATED): Heparin Unfractionated: 0.53 IU/mL (ref 0.30–0.70)

## 2019-08-30 MED ORDER — POLYETHYLENE GLYCOL 3350 17 G PO PACK
17.0000 g | PACK | Freq: Every day | ORAL | Status: DC | PRN
  Filled 2019-08-30: qty 1

## 2019-08-30 MED ORDER — NAPHAZOLINE-GLYCERIN 0.012-0.2 % OP SOLN
1.0000 [drp] | Freq: Four times a day (QID) | OPHTHALMIC | Status: DC | PRN
  Filled 2019-08-30: qty 15

## 2019-08-30 MED ORDER — MAGNESIUM CITRATE PO SOLN
1.0000 | Freq: Once | ORAL | Status: AC
  Administered 2019-08-30: 1 via ORAL
  Filled 2019-08-30: qty 296

## 2019-08-30 NOTE — Evaluation (Signed)
Speech Language Pathology Evaluation Patient Details Name: Joshua [name] MRN: [id number] DOB: [birth year] Today's Date: 08/30/2019 Time: [id number] SLP Time Calculation (min) (ACUTE ONLY): 20 min  Problem List:  Patient Active Problem List   Diagnosis Date Noted  . Cerebral embolism with cerebral infarction 08/29/2019  . 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 Hyperlipidemia 08/28/2019  . Chronic kidney disease 08/28/2019  . Obesity (BMI 30-39.9) 08/28/2019  . Tremor 07/07/2019  . Essential tremor 06/22/2017  . OSA on CPAP 04/10/2017  . Essential hypertension 04/10/2017  . Pulmonary thromboembolism ([place]) 04/10/2017  . PE (pulmonary thromboembolism) ([place]) 04/09/2017  . S/P left TKA 03/30/2017  . S/P total knee replacement 03/30/2017  . Cellulitis of left foot 03/07/2016  . Pre-ulcerative calluses 03/07/2016  . Type 2 diabetes mellitus with left diabetic foot ulcer ([place]) 01/14/2016  . Diabetes mellitus type 2 in obese ([place]) 08/07/2008  . GERD 08/07/2008   Past Medical History:  Past Medical History:  Diagnosis Date  . Benign essential tremor   . Benign positional vertigo   . CVA (cerebral vascular accident) ([place])    x2 - L retina, 1 right parietal  . Degenerative arthritis   . Depression   . Diabetes mellitus   . Dyslipidemia   . GERD (gastroesophageal reflux disease)    hiatal hernia  . Gout   . H/O: vasectomy   . Hearing aid worn    b/l  . Hx of appendectomy   . Hx of tonsillectomy   . Hypertension   . 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 Melanoma ([place])   . Obesity   . OSA on CPAP    setting = 5  . Tremor, essential 06/22/2017  . Wears glasses    Past Surgical History:  Past Surgical History:  Procedure Laterality Date  . APPENDECTOMY    . arthroscopic knee surgery Bilateral   . CATARACT EXTRACTION Bilateral   . COLONOSCOPY    . MINOR PLACEMENT OF FIDUCIAL N/A 06/30/2019   Procedure: Fiducial placement;  Surgeon: Erline Levine, MD;   Location: [place];  Service: Neurosurgery;  Laterality: N/A;  Fiducial placement  . NASAL SEPTUM SURGERY    . PULSE GENERATOR IMPLANT N/A 07/14/2019   Procedure: Left cranial Implanted Pulse Generator and lead extension placement to right chest ;  Surgeon: Erline Levine, MD;  Location: [place];  Service: Neurosurgery;  Laterality: N/A;  . SUBTHALAMIC STIMULATOR INSERTION Left 07/07/2019   Procedure: LEFT DEEP BRAIN STIMULATOR PLACEMENT;  Surgeon: Erline Levine, MD;  Location: [place];  Service: Neurosurgery;  Laterality: Left;  . TONSILLECTOMY    . TOTAL KNEE ARTHROPLASTY Left 03/30/2017   Procedure: LEFT TOTAL KNEE ARTHROPLASTY;  Surgeon: Paralee Cancel, MD;  Location: [facility];  Service: Orthopedics;  Laterality: Left;  . WISDOM TOOTH EXTRACTION     HPI:  Joshua [name] is a 68 y.o. male with medical history significant of hypertension, hyperlipidemia, diabetes mellitus, obstructive sleep apnea on CPAP, essential tremors status post deep brain stimulator placement on 05/07/2019, obesity, CVA presents to emergency department due to slurred speech, generalized weakness since last night and nausea since this morning. 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  No prior history of dysphagia or cognitive-linguistic deficits.    Assessment / Plan / Recommendation Clinical Impression  Pt presents with mild dysarthria and functional cognitive/language abilities.  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  Pt additionally stated that he and his wife share the responsibility of caring for his mother-in-law.  Pt completed all receptive and expressive language tasks with 100% accuracy independently.  He additionally completed the [facility] Cognitive Assessment [facility]) and he scored 26/30 which is WNL.  Pt presented with a slowed rate of speech and his intelligibility was mildly reduced at the word, phrase,  sentence, and conversation level.  Suspect that this is related to mild oral weakness and possible decreased lingual coordination per observations from [place] tasks.  Pt and wife endorsed dysarthria and stated that it was an acute change from baseline.  Speech Therapy will continue to follow for dysarthria treatment per POC.      SLP Assessment  SLP Visit Diagnosis: Dysarthria and anarthria (R47.1)    Follow Up Recommendations  (TBD)    Frequency and Duration min 2x/week  2 weeks      SLP Evaluation Cognition  Overall Cognitive Status: Within Functional Limits for tasks assessed Arousal/Alertness: Awake/alert Orientation Level: Oriented X4 Attention: Sustained Sustained Attention: Appears intact Memory: Appears intact Awareness: Appears intact Problem Solving: Appears intact Executive Function: Reasoning;Organizing Reasoning: Appears intact Organizing: Appears intact Safety/Judgment: Appears intact       Comprehension  Auditory Comprehension Overall Auditory Comprehension: Appears within functional limits for tasks assessed Yes/No Questions: Within Functional Limits Commands: Within Functional Limits Conversation: Complex    Expression Expression Primary Mode of Expression: Verbal Verbal Expression Overall Verbal Expression: Appears within functional limits for tasks assessed Repetition: No impairment Naming: No impairment Pragmatics: No impairment Written Expression Dominant Hand: Right   Oral / Motor  Oral Motor/Sensory Function Overall Oral Motor/Sensory Function: Within functional limits Motor Speech Overall Motor Speech: Impaired Respiration: Within functional limits Phonation: Normal Resonance: Within functional limits Articulation: Impaired Level of Impairment: Word Intelligibility: Intelligibility reduced Word: 75-100% accurate Phrase: 75-100% accurate Sentence: 75-100% accurate Conversation: 75-100% accurate Motor Planning: Witnin functional  limits Effective Techniques: Over-articulate   Bretta Bang, M.S., [place] Acute Rehabilitation Services Office: [phone]                   Joshua [name] 08/30/2019, 3:42 PM

## 2019-08-30 NOTE — Progress Notes (Signed)
Occupational Therapy Evaluation Patient Details Name: Joshua [name] MRN: [id number] DOB: Jul 19, 1951 Today's Date: 08/30/2019    History of Present Illness  Joshua [name] is a 68 y.o. male with medical history significant of hypertension, hyperlipidemia, diabetes mellitus, obstructive sleep apnea on CPAP, essential tremors status post deep brain stimulator placement on 05/07/2019, obesity, CVA presents to emergency department due to slurred speech, generalized weakness since last night and nausea since this morning. Pt with suspected cerebellar infarct in the setting of subclavian artery thrombosis   Clinical Impression   PTA, pt was living at home with his wife and mother-in-law, pt reports he was independent with ADL/IADL and functional mobility with use of spc following foot surgery. 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 Pt currently required minguard for ADL and functional mobility at RW level initially progressing to Supervision for ADL and functional mobility without use of AD. Patient evaluated by Occupational Therapy with no further acute OT needs identified. All education has been completed and the patient has no further questions. See below for any follow-up Occupational Therapy or equipment needs. OT to sign off. Thank you for referral.      Follow Up Recommendations  No OT follow up    Equipment Recommendations  None recommended by OT    Recommendations for Other Services       Precautions / Restrictions Precautions Precautions: Fall Required Braces or Orthoses: Other Brace Other Brace: CAM boot Restrictions Weight Bearing Restrictions: No      Mobility Bed Mobility Overal bed mobility: Independent                Transfers Overall transfer level: Needs assistance Equipment used: Rolling walker (2 wheeled);1 person hand held assist Transfers: Sit to/from Stand Sit to Stand: Min guard;Supervision          General transfer comment: minguard initially with RW, pt progressing to 1 person hand held, then progressing to Supervision     Balance Overall balance assessment: Needs assistance Sitting-balance support: No upper extremity supported;Feet supported Sitting balance-Leahy Scale: Good     Standing balance support: No upper extremity supported;During functional activity Standing balance-Leahy Scale: Good Standing balance comment: able to tolerate standing without UE support, amublated in hallway without use of AD, minor deficits noted, pt was also wearing CAM walker boot                           ADL either performed or assessed with clinical judgement   ADL Overall ADL's : Needs assistance/impaired                                     Functional mobility during ADLs: Supervision/safety;Min guard General ADL Comments: pt completed all ADL with minguard progressing to SUpervision;completed ADL while standing at sink level demonstrated good dynamic balance, bending over to pick up item;discussed compensatory strategies for vertigo      Vision Baseline Vision/History: Wears glasses Wears Glasses: At all times Patient Visual Report: No change from baseline Vision Assessment?: Yes Eye Alignment: Within Functional Limits Ocular Range of Motion: Within Functional Limits Alignment/Gaze Preference: Within Defined Limits Tracking/Visual Pursuits: Able to track stimulus in all quads without difficulty Visual Fields: No apparent deficits     Perception     Praxis      Pertinent Vitals/Pain Pain Assessment: No/denies pain  Hand Dominance Right   Extremity/Trunk Assessment Upper Extremity Assessment Upper Extremity Assessment: RUE deficits/detail;LUE deficits/detail RUE Deficits / Details: grossly 4+/5;full ROM;brainstem stimulator for RUE, no essential tremor  RUE Sensation: WNL RUE Coordination: WNL LUE Deficits / Details: grossly 4+/5;noted essential  tremor, pt reports this is baseline LUE Sensation: WNL LUE Coordination: decreased gross motor;decreased fine motor   Lower Extremity Assessment Lower Extremity Assessment: Defer to PT evaluation;RLE deficits/detail RLE Deficits / Details: 5th metatarsal head resection;CAM boot in room   Cervical / Trunk Assessment Cervical / Trunk Assessment: Normal   Communication Communication Communication: Expressive difficulties(scanning, slurred speech )   Cognition Arousal/Alertness: Awake/alert Behavior During Therapy: WFL for tasks assessed/performed Overall Cognitive Status: Within Functional Limits for tasks assessed                                     General Comments  VSS     Exercises     Shoulder Instructions      Home Living Family/patient expects to be discharged to:: Private residence Living Arrangements: Spouse/significant other Available Help at Discharge: Family;Available 24 hours/day Type of Home: House Home Access: Stairs to enter [employer] of Steps: 3 Entrance Stairs-Rails: Left;Right Home Layout: One level     Bathroom Shower/Tub: Occupational psychologist: Standard     Home Equipment: Environmental consultant - 2 wheels;Cane - single point;Grab bars - toilet;Grab bars - tub/shower          Prior Functioning/Environment Level of Independence: Independent;Needs assistance  Gait / Transfers Assistance Needed: was using spc following foot surgery ADL's / Homemaking Assistance Needed: independent   Comments: retired Art therapist from [employer];currently enjoys substitute teaching, gardening, reports he has vertigo at baseline;        OT Problem List: Decreased safety awareness;Decreased knowledge of precautions      OT Treatment/Interventions:      OT Goals(Current goals can be found in the care plan section) Acute Rehab OT Goals Patient Stated Goal: to get back to teaching OT Goal Formulation: With patient Time For  Goal Achievement: 09/13/19 Potential to Achieve Goals: Good  OT Frequency:     Barriers to D/C:            Co-evaluation              AM-PAC OT "6 Clicks" Daily Activity     Outcome Measure Help from another person eating meals?: None Help from another person taking care of personal grooming?: A Little Help from another person toileting, which includes using toliet, bedpan, or urinal?: A Little Help from another person bathing (including washing, rinsing, drying)?: A Little Help from another person to put on and taking off regular upper body clothing?: None Help from another person to put on and taking off regular lower body clothing?: A Little 6 Click Score: 20   End of Session Equipment Utilized During Treatment: Gait belt;Rolling walker Nurse Communication: Mobility status  Activity Tolerance: Patient tolerated treatment well Patient left: in chair;with call bell/phone within reach  OT Visit Diagnosis: Other abnormalities of gait and mobility (R26.89)                Time: [id number] OT Time Calculation (min): 35 min Charges:  OT General Charges $OT Visit: 1 Visit OT Evaluation $OT Eval Moderate Complexity: [place] OTR/L Acute Rehabilitation Services Office: [place] 08/30/2019, 10:14  AM

## 2019-08-30 NOTE — Progress Notes (Signed)
ANTICOAGULATION CONSULT NOTE  Pharmacy Consult:  Heparin Indication:  Subclavian artery thrombus   Patient Measurements: Height: 6\' 4"  (193 cm) Weight: 264 lb (119.7 kg) IBW/kg (Calculated) : 86.8 Heparin Dosing Weight: 111.9 kg  Vital Signs: Temp: 97.5 F (36.4 C) (10/06 0345) Temp Source: Oral (10/06 0345) BP: 145/85 (10/06 0345) Pulse Rate: 80 (10/06 0345)  Labs: Recent Labs    08/28/19 1300 08/28/19 1316  08/29/19 0409 08/29/19 1430 08/29/19 2329 08/30/19 0651  HGB 12.9* 13.3  --  13.2  --   --  11.9*  HCT 38.8* 39.0  --  39.5  --   --  35.8*  PLT 281  --   --  222  --   --  264  APTT 34  --   --   --   --   --   --   LABPROT 13.9  --   --   --   --   --   --   INR 1.1  --   --   --   --   --   --   HEPARINUNFRC  --   --    < > <0.10* <0.10* 0.30 0.53  CREATININE 1.51* 1.40*  --   --   --   --   --    < > = values in this interval not displayed.    Assessment: 68 year old male admitted with aphasia. He did not receive tPA as he was outside the window. Found to have intraluminal thrombus in the left subclavian artery and Pharmacy consulted to dose IV heparin.  Heparin level is slightly supra-therapeutic; no bleeding reported.  Goal of Therapy:  Heparin level 0.3-0.5 units/mL in setting of new CVA Monitor platelets by anticoagulation protocol: Yes   Plan:  Reduce heparin gtt slightly to 2150 units/hr Daily heparin level and CBC F/u repeat CTA and [facility] plans  Barnaby Rippeon D. Mina Marble, PharmD, BCPS, [place] 08/30/2019, 8:19 AM

## 2019-08-30 NOTE — Telephone Encounter (Signed)
I spoke with patient.

## 2019-08-30 NOTE — Plan of Care (Signed)
  Problem: Activity: Goal: Risk for activity intolerance will decrease Outcome: Progressing   

## 2019-08-30 NOTE — Progress Notes (Signed)
[facility]  Subjective  -no new neurologic events overnight.  Tolerating heparin.  Still some swallowing and speech issues.   Objective (!) 145/85 80 (!) 97.5 F (36.4 C) (Oral) 18 98%  Intake/Output Summary (Last 24 hours) at 08/30/2019 0803 Last data filed at 08/30/2019 0600 Gross per 24 hour  Intake 1910.77 ml  Output 825 ml  Net 1085.77 ml    Left radial pulse palpable.  Laboratory Lab Results: Recent Labs    08/29/19 0409 08/30/19 0651  WBC 8.1 7.7  HGB 13.2 11.9*  HCT 39.5 35.8*  PLT 222 264   BMET Recent Labs    08/28/19 1300 08/28/19 1316  NA 140 141  K 4.2 4.2  CL 105 105  CO2 27  --   GLUCOSE 196* 198*  BUN 25* 31*  CREATININE 1.51* 1.40*  CALCIUM 9.3  --     COAG Lab Results  Component Value Date   INR 1.1 08/28/2019   INR 1.16 04/09/2017   No results found for: PTT  Assessment/Planning:  68 year old male admitted with suspected small cerebellar stroke.  This is in the setting of a very small focal area of thrombus in the proximal left subclavian artery with some underlying plaque.  Initially recommended anticoagulation with heparin which she appears to be tolerating.  He has an MRI that is pending today.  We will likely plan for repeat CTA at 72 hours to re-evaluate subclavian thrombus.  Marty Heck 08/30/2019 8:03 AM --

## 2019-08-30 NOTE — Progress Notes (Signed)
ANTICOAGULATION CONSULT NOTE   Pharmacy Consult for heparin Indication: stroke   Patient Measurements: Height: 6\' 4"  (193 cm) Weight: 264 lb (119.7 kg) IBW/kg (Calculated) : 86.8 Heparin Dosing Weight: 111.9 kg  Vital Signs: Temp: 98.4 F (36.9 C) (10/05 2339) Temp Source: Oral (10/05 2339) BP: 150/96 (10/05 2339) Pulse Rate: 83 (10/05 2339)  Labs: Recent Labs    08/28/19 1300 08/28/19 1316 08/29/19 0409 08/29/19 1430 08/29/19 2329  HGB 12.9* 13.3 13.2  --   --   HCT 38.8* 39.0 39.5  --   --   PLT 281  --  222  --   --   APTT 34  --   --   --   --   LABPROT 13.9  --   --   --   --   INR 1.1  --   --   --   --   HEPARINUNFRC  --   --  <0.10* <0.10* 0.30  CREATININE 1.51* 1.40*  --   --   --       Medical History: Past Medical History:  Diagnosis Date  . Benign essential tremor   . Benign positional vertigo   . CVA (cerebral vascular accident) ([place])    x2 - L retina, 1 right parietal  . Degenerative arthritis   . Depression   . Diabetes mellitus   . Dyslipidemia   . GERD (gastroesophageal reflux disease)    hiatal hernia  . Gout   . H/O: vasectomy   . Hearing aid worn    b/l  . Hx of appendectomy   . Hx of tonsillectomy   . Hypertension   . 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 Melanoma ([place])   . Obesity   . OSA on CPAP    setting = 5  . Tremor, essential 06/22/2017  . Wears glasses     Assessment:  68 year old male admitted with aphasia. He did not receive tPA as he was outside the window. Planning to start heparin while undergoing workup. He is not on anticoagulation prior to admission. Of note, found to have intraluminal thrombus in the left subclavian artery.  Heparin level came back therapeutic at 0.3, on 2100 units/hr. Hgb 13.2, plt 222 this morning. No s/sx of bleeding. No issues per nursing.    Goal of Therapy:  Heparin level 0.3-0.5 units/ml Monitor platelets by anticoagulation protocol: Yes   Plan:  -Increase heparin  infusion to 2200 units/hr to keep in therapeutic range -Order heparin level in 6-8 hr -Daily HL, CBC, s/sx of bleeding  Antonietta Jewel, PharmD, BCCCP Clinical Pharmacist   Please check AMION for all [place] phone numbers After 10:00 PM, call [place] [phone] 08/30/2019, 12:01 AM

## 2019-08-30 NOTE — Evaluation (Signed)
Physical Therapy Evaluation Patient Details Name: Joshua [name] MRN: [id number] DOB: Nov 11, 1951 Today's Date: 08/30/2019   History of Present Illness   Joshua [name] is a 68 y.o. male with medical history significant of hypertension, hyperlipidemia, diabetes mellitus, obstructive sleep apnea on CPAP, essential tremors status post deep brain stimulator placement on 05/07/2019, obesity, CVA presents to emergency department due to slurred speech, generalized weakness since last night and nausea since this morning. Pt with suspected cerebellar infarct in the setting of subclavian artery thrombosis  Clinical Impression  Pt admitted with above diagnosis. Pt ambulated >300' first with use of RW and then with no AD, began with min A progressing to min-guard. Mild balance deficits noted but pt having to wear a CAM boot R foot currently. Will follow acutely but do not anticipate him needing post acute PT. Pt currently with functional limitations due to the deficits listed below (see PT Problem List). Pt will benefit from skilled PT to increase their independence and safety with mobility to allow discharge to the venue listed below.       Follow Up Recommendations No PT follow up    Equipment Recommendations  None recommended by PT    Recommendations for Other Services Speech consult     Precautions / Restrictions Precautions Precautions: Fall Required Braces or Orthoses: Other Brace Other Brace: CAM boot R Restrictions Weight Bearing Restrictions: No      Mobility  Bed Mobility Overal bed mobility: Independent                Transfers Overall transfer level: Needs assistance Equipment used: Rolling walker (2 wheeled);1 person hand held assist Transfers: Sit to/from Stand Sit to Stand: Min guard;Supervision         General transfer comment: moves slowly due to h/o vertigo. minguard initially with RW, pt progressing to 1 person hand held, then progressing to Supervision    Ambulation/Gait Ambulation/Gait assistance: Min assist;Min guard Gait Distance (Feet): 350 Feet Assistive device: Rolling walker (2 wheeled);None Gait Pattern/deviations: Step-through pattern;Wide base of support Gait velocity: decreased Gait velocity interpretation: >2.62 ft/sec, indicative of community ambulatory General Gait Details: mildly unsteady but no overt LOB with level surface gait. Ambulated 150' with RW and min A progressing to min-guard. Then ambulated another 150' without RW and min A progressing to min-guard  Science writer    Modified Rankin (Stroke Patients Only) Modified Rankin (Stroke Patients Only) Pre-Morbid Rankin Score: No symptoms Modified Rankin: Slight disability     Balance Overall balance assessment: Needs assistance Sitting-balance support: No upper extremity supported;Feet supported Sitting balance-Leahy Scale: Good     Standing balance support: No upper extremity supported;During functional activity Standing balance-Leahy Scale: Good Standing balance comment: able to shift wt, maintain rhomberg, and rhomberg eyes closed with no LOB. Mild deficits noted with dynamic activity                             Pertinent Vitals/Pain Pain Assessment: No/denies pain    Home Living Family/patient expects to be discharged to:: Private residence Living Arrangements: Spouse/significant other Available Help at Discharge: Family;Available 24 hours/day Type of Home: House Home Access: Stairs to enter Entrance Stairs-Rails: Can reach both;Left;Right Entrance Stairs-Number of Steps: 3 Home Layout: One level Home Equipment: Walker - 2 wheels;Cane - single point;Grab bars - toilet;Grab bars - tub/shower Additional Comments: lives with wife and her 74 yo  mother. 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;currently enjoys substitute teaching, gardening, reports he has vertigo at baseline;     Hand Dominance   Dominant Hand: Right    Extremity/Trunk Assessment   Upper Extremity Assessment Upper Extremity Assessment: Defer to OT evaluation    Lower Extremity Assessment Lower Extremity Assessment: RLE deficits/detail;LLE deficits/detail RLE Deficits / Details: CAM boot since foot surgery, still has stitches. Pt with decreased R foot discomfort compared to before surgery. Strength 4+/5 RLE Sensation: WNL RLE Coordination: WNL LLE Deficits / Details: strength 4+/5, h/o L tka LLE Sensation: WNL LLE Coordination: WNL    Cervical / Trunk Assessment Cervical / Trunk Assessment: Normal  Communication   Communication: Expressive difficulties  Cognition Arousal/Alertness: Awake/alert Behavior During Therapy: WFL for tasks assessed/performed Overall Cognitive Status: Within Functional Limits for tasks assessed                                        General Comments General comments (skin integrity, edema, etc.): discussed gaze stabilization with object retrieval from floor    Exercises     Assessment/Plan    PT Assessment Patient needs continued PT services  PT Problem List Decreased balance;Decreased knowledge of precautions       PT Treatment Interventions DME instruction;Gait training;Stair training;Functional mobility training;Therapeutic activities;Therapeutic exercise;Balance training;Patient/family education    PT Goals (Current goals can be found in the Care Plan section)  Acute Rehab 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 PT Goal Formulation: With patient Time For Goal Achievement: 09/13/19 Potential to Achieve Goals: Good    Frequency Min 4X/week   Barriers to discharge         Co-evaluation PT/OT/SLP Co-Evaluation/Treatment: Yes Reason for Co-Treatment: Complexity of the patient's impairments (multi-system involvement);For patient/therapist safety PT goals addressed during session: Mobility/safety with mobility;Balance;Proper use of DME;Strengthening/ROM         AM-PAC PT "6 Clicks" Mobility  Outcome Measure Help needed turning from your back to your side while in a flat bed without using bedrails?: None Help needed moving from lying on your back to sitting on the side of a flat bed without using bedrails?: None Help needed moving to and from a bed to a chair (including a wheelchair)?: A Little Help needed standing up from a chair using your arms (e.g., wheelchair or bedside chair)?: A Little Help needed to walk in hospital room?: A Little Help needed climbing 3-5 steps with a railing? : A Little 6 Click Score: 20    End of Session Equipment Utilized During Treatment: Gait belt Activity Tolerance: Patient tolerated treatment well Patient left: in chair;with call bell/phone within reach Nurse Communication: Mobility status PT Visit Diagnosis: Unsteadiness on feet (R26.81)    Time: [id number] PT Time Calculation (min) (ACUTE ONLY): 35 min   Charges:   PT Evaluation $PT Eval Moderate Complexity: [place]  Pager [phone] Office [place] 08/30/2019, 1:03 PM

## 2019-08-30 NOTE — Evaluation (Signed)
Clinical/Bedside Swallow Evaluation Patient Details  Name: Joshua [name] MRN: [id number] Date of Birth: October 10, 1951  Today's Date: 08/30/2019 Time: SLP Start Time (ACUTE ONLY): 1420 SLP Stop Time (ACUTE ONLY): 1435 SLP Time Calculation (min) (ACUTE ONLY): 15 min  Past Medical History:  Past Medical History:  Diagnosis Date  . Benign essential tremor   . Benign positional vertigo   . CVA (cerebral vascular accident) ([place])    x2 - L retina, 1 right parietal  . Degenerative arthritis   . Depression   . Diabetes mellitus   . Dyslipidemia   . GERD (gastroesophageal reflux disease)    hiatal hernia  . Gout   . H/O: vasectomy   . Hearing aid worn    b/l  . Hx of appendectomy   . Hx of tonsillectomy   . Hypertension   . 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 Melanoma ([place])   . Obesity   . OSA on CPAP    setting = 5  . Tremor, essential 06/22/2017  . Wears glasses    Past Surgical History:  Past Surgical History:  Procedure Laterality Date  . APPENDECTOMY    . arthroscopic knee surgery Bilateral   . CATARACT EXTRACTION Bilateral   . COLONOSCOPY    . MINOR PLACEMENT OF FIDUCIAL N/A 06/30/2019   Procedure: Fiducial placement;  Surgeon: Erline Levine, MD;  Location: [facility];  Service: Neurosurgery;  Laterality: N/A;  Fiducial placement  . NASAL SEPTUM SURGERY    . PULSE GENERATOR IMPLANT N/A 07/14/2019   Procedure: Left cranial Implanted Pulse Generator and lead extension placement to right chest ;  Surgeon: Erline Levine, MD;  Location: [facility];  Service: Neurosurgery;  Laterality: N/A;  . SUBTHALAMIC STIMULATOR INSERTION Left 07/07/2019   Procedure: LEFT DEEP BRAIN STIMULATOR PLACEMENT;  Surgeon: Erline Levine, MD;  Location: [facility];  Service: Neurosurgery;  Laterality: Left;  . TONSILLECTOMY    . TOTAL KNEE ARTHROPLASTY Left 03/30/2017   Procedure: LEFT TOTAL KNEE ARTHROPLASTY;  Surgeon: Paralee Cancel, MD;  Location: [facility];  Service: Orthopedics;  Laterality: Left;  . WISDOM  TOOTH EXTRACTION     HPI:  Joshua [name] is a 68 y.o. male with medical history significant of hypertension, hyperlipidemia, diabetes mellitus, obstructive sleep apnea on CPAP, essential tremors status post deep brain stimulator placement on 05/07/2019, obesity, CVA presents to emergency department due to slurred speech, generalized weakness since last night and nausea since this morning. 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  No prior history of dysphagia or cognitive-linguistic deficits.    Assessment / Plan / Recommendation Clinical Impression  Pt presents with suspected mild oropharyngeal dysphagia.  Pt was observed with trials of thin liquid and regular solids.  No clinical s/sx of aspiration were observed in this evaluation despite challenging; however, pt reported acute sensation change when swallowing and occasional coughing/choking with thin liquids.  Pt additionally reported increased eructation with po intake since admission.  Pt exhibited mildly prolonged but effective mastication of regular solids with trace oral residue.  Recommended Dysphagia 3 (mech soft) solids to patient; however, he stated that he preferred to continue consuming regular solids with compensatory strategies at this time.  Therefore, recommend continuation of regular solids and thin liquids with the following precautions: 1) Small bites/sips 2) Slow rate of intake 3) Sit upright 90 degrees 4) Remain upright for 20-30 minutes following po intake.  SLP will continue to monitor for diet tolerance.  SLP Visit Diagnosis: Dysphagia, unspecified (R13.10)    Aspiration Risk  Mild aspiration risk    Diet Recommendation Thin liquid;Dysphagia 3 (Mech soft)   Liquid Administration via: Cup;Straw Medication Administration: Whole meds with liquid Supervision: Patient able to self feed Compensations: Slow rate;Small sips/bites Postural Changes: Seated upright at 90 degrees     Other  Recommendations Recommended Consults: Consider esophageal assessment Oral Care Recommendations: Oral care BID   Follow up Recommendations (TBD)      Frequency and Duration min 2x/week  2 weeks       Prognosis Prognosis for Safe Diet Advancement: Good      Swallow Study   General Date of Onset: 08/30/19 HPI: Joshua [name] is a 68 y.o. male with medical history significant of hypertension, hyperlipidemia, diabetes mellitus, obstructive sleep apnea on CPAP, essential tremors status post deep brain stimulator placement on 05/07/2019, obesity, CVA presents to emergency department due to slurred speech, generalized weakness since last night and nausea since this morning. 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  No prior history of dysphagia or cognitive-linguistic deficits.  Type of Study: Bedside Swallow Evaluation Previous Swallow Assessment: None Diet Prior to this Study: Regular;Thin liquids Temperature Spikes Noted: No Respiratory Status: Room air History of Recent Intubation: No Behavior/Cognition: Alert;Cooperative;Pleasant mood Oral Cavity Assessment: Within Functional Limits Oral Care Completed by SLP: No Oral Cavity - Dentition: Adequate natural dentition Vision: Functional for self-feeding Self-Feeding Abilities: Able to feed self Patient Positioning: Upright in bed Baseline Vocal Quality: Normal Volitional Swallow: Able to elicit    Oral/Motor/Sensory Function Overall Oral Motor/Sensory Function: Within functional limits   Ice Chips Ice chips: Not tested   Thin Liquid Thin Liquid: Within functional limits Presentation: Cup;Straw    Nectar Thick Nectar Thick Liquid: Not tested   Honey Thick Honey Thick Liquid: Not tested   Puree Puree: Not tested   Solid     Solid: Impaired Presentation: Spoon Oral Phase Impairments: Impaired mastication Oral Phase Functional Implications: Prolonged oral transit;Impaired  mastication     Bretta Bang, M.S., [place] [facility] Office: [phone]   Elvia Collum Karey Suthers 08/30/2019,3:22 PM

## 2019-08-30 NOTE — Progress Notes (Signed)
STROKE TEAM PROGRESS NOTE   INTERVAL HISTORY Pt lying in bed, wife at bedside. Pt speech improved, less scanning speech than yesterday. Moving all extremities. Passed swallow on dys 3 and thin liquid. PT/OT no therapy needs. Pending MRI. Still on heparin IV and pending CTA neck tomorrow.   Vitals:   08/29/19 2339 08/30/19 0345 08/30/19 0957 08/30/19 1224  BP: (!) 150/96 (!) 145/85 (!) 147/93 (!) 147/93  Pulse: 83 80 85 83  Resp: 17 18 19 18   Temp: 98.4 F (36.9 C) (!) 97.5 F (36.4 C) 98.2 F (36.8 C) 98.9 F (37.2 C)  TempSrc: Oral Oral Oral Oral  SpO2: 100% 98% 98% 98%  Weight:      Height:        CBC:  Recent Labs  Lab 08/28/19 1300  08/29/19 0409 08/30/19 0651  WBC 8.1  --  8.1 7.7  NEUTROABS 4.1  --   --   --   HGB 12.9*   < > 13.2 11.9*  HCT 38.8*   < > 39.5 35.8*  MCV 89.6  --  91.2 89.5  PLT 281  --  222 264   < > = values in this interval not displayed.    Basic Metabolic Panel:  Recent Labs  Lab 08/28/19 1300 08/28/19 1316  NA 140 141  K 4.2 4.2  CL 105 105  CO2 27  --   GLUCOSE 196* 198*  BUN 25* 31*  CREATININE 1.51* 1.40*  CALCIUM 9.3  --    Lipid Panel:     Component Value Date/Time   CHOL 183 08/29/2019 0409   TRIG 249 (H) 08/29/2019 0409   HDL 26 (L) 08/29/2019 0409   CHOLHDL 7.0 08/29/2019 0409   VLDL 50 (H) 08/29/2019 0409   LDLCALC 107 (H) 08/29/2019 0409   HgbA1c:  Lab Results  Component Value Date   HGBA1C 7.8 (H) 08/29/2019   Urine Drug Screen:     Component Value Date/Time   LABOPIA NONE DETECTED 08/28/2019 1413   COCAINSCRNUR NONE DETECTED 08/28/2019 1413   LABBENZ NONE DETECTED 08/28/2019 1413   AMPHETMU NONE DETECTED 08/28/2019 1413   THCU NONE DETECTED 08/28/2019 1413   LABBARB NONE DETECTED 08/28/2019 1413    Alcohol Level No results found for: ETH  IMAGING No results found.  PHYSICAL EXAM    Temp:  [97.5 F (36.4 C)-98.9 F (37.2 C)] 98.9 F (37.2 C) (10/06 1224) Pulse Rate:  [77-85] 83 (10/06  1224) Resp:  [17-21] 18 (10/06 1224) BP: (137-155)/(85-96) 147/93 (10/06 1224) SpO2:  [95 %-100 %] 98 % (10/06 1224)  General - Well nourished, well developed, in no apparent distress.  Ophthalmologic - fundi not visualized due to noncooperation.  Cardiovascular - Regular rhythm and rate.  Mental Status -  Level of arousal and orientation to time, place, and person were intact. Language including expression, naming, repetition, comprehension was assessed and found intact. Fund of Knowledge was assessed and was intact. However, scanning speech  Cranial Nerves II - XII - II - Visual field intact OU. III, IV, VI - Extraocular movements intact. V - Facial sensation intact bilaterally. VII - Facial movement intact bilaterally. VIII - Hearing & vestibular intact bilaterally. X - Palate elevates symmetrically. Scanning speech improved XI - Chin turning & shoulder shrug intact bilaterally. XII - Tongue protrusion intact.  Motor Strength - The patient's strength was normal in all extremities and pronator drift was absent.  Bulk was normal and fasciculations were absent.   Motor Tone -  Muscle tone was assessed at the neck and appendages and was normal.  Reflexes - The patient's reflexes were symmetrical in all extremities and he had no pathological reflexes.  Sensory - Light touch, temperature/pinprick were assessed and were symmetrical.    Coordination - The patient had normal movements in the hands and feet with no obvious ataxia or dysmetria.  Mild essential was present more on the right hand DBS turned.  Gait and Station - deferred.   ASSESSMENT/PLAN [name] is a 68 y.o. male with history of HTN, HLD, DB presenting with dysarthria, mild Larm>L leg ataxia.   Stroke:  possible left cerebellar vs. Left lateral medullary infarct, likely embolic secondary to L subclavian thrombus  CT head L thalamic deep brain stimulator in place. No hemorrhage. Moderate to moderate  penrumocephalus anteriorly bilaterally. Chronic ischemic changes.   CTA head & neck no ELVO. suspected thrombus proximal L subclavian. L DBS intact. B ICA atherosclerosis with mild progression since 2012.   CT perfusion no core infarct or penumbra  MRI pending   Repeat CTA pending  2D Echo EF 55-60%. No source of embolus   LDL 107  HgbA1c 7.8  IV heparinfor VTE prophylaxis  aspirin 81 mg daily prior to admission, now on heparin IV. If MRI no large stroke, pt tolerating heparin IV for 2-3 days, can consider switch to po anticoagulation. Recommend to continue anticoagulation for 2-3 months and then repeat CTA neck, if subclavian artery thrombus resolves, can switch to antiplatelet.   Therapy recommendations:  No therapy needs  Disposition:  pending   Follow up with Dr. Carles Collet at d/c for neuro care  L Subclavian Artery Thrombus  VVS consulted Carlis Abbott)  CTA head & neck  suspected thrombus proximal L subclavian.   On IV heparin  Not sure source of thrombus, cardioembolic vs. In situ  Repeat CTA at 72 hrs  Recommend 30 day cardiac event monitoring as outpt to rule out afib  Hypertension  Stable . Permissive hypertension (OK if < 180/105) but gradually normalize in 5-7 days . Long-term BP goal normotensive  Hyperlipidemia  Home meds:  liptor 40 and fish oil  Now on lipitor 80   resumed in hospital  LDL 107, goal < 70  Continue statin at discharge  Diabetes type II Uncontrolled  HgbA1c 7.8, goal < 7.0  CBGs  SSI  Close PCP follow up  Other Stroke Risk Factors  Advanced age  Former Cigarette smoker, quit 35 yrs ago  ETOH use,  advised to drink no more than 2 drink(s) a day  Obesity, Body mass index is 32.14 kg/m., recommend weight loss, diet and exercise as appropriate   Hx stroke/TIA  03/2011 - small R parasagittal frontal white matter infarct / corpus callosum   Obstructive sleep apnea, on CPAP at home  Other Active Problems  Essential  tremors s/p DBS 05/07/2019 - Followed by Dr. Carles Collet  CKD  Hospital day # 2   Joshua Hawking, MD PhD Stroke Neurology 08/30/2019 12:33 PM    To contact Stroke Continuity provider, please refer to [email or web address] After hours, contact General Neurology

## 2019-08-30 NOTE — Progress Notes (Signed)
PROGRESS NOTE    VELMER PANJWANI  [id number] DOB: August 26, 1951 DOA: 08/28/2019 PCP: [name], MD   Brief Narrative: ELAI FARRER is a 68 y.o. male with medical history significant of hypertension, hyperlipidemia, diabetes mellitus, obstructive sleep apnea on CPAP, essential tremors status post deep brain stimulator placement on 05/07/2019, obesity, CVA. Patient presented secondary to dysarthria, weakness and nausea concerning for stroke.   Assessment & Plan:   Principal Problem:   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HCC) Active Problems:   Diabetes mellitus type 2 in obese (HCC)   GERD   OSA on CPAP   Essential hypertension   Essential tremor   Hyperlipidemia   Chronic kidney disease   Obesity (BMI 30-39.9)   Cerebral embolism with cerebral infarction   Dysarthria Concern for stroke. Neurology on board. Unable to obtain MRI. Hemoglobin A1C of 7.8%. LDL of 107. Transthoracic Echocardiogram unremarkable for embolic source. Speech with some improvement -Neurology recommendations: MRI pending -SLP recommendations pending  Subclavian artery thrombosis Vascular surgery consulted and recommending full dose anticoagulation. Patient started on heparin drip -Continue Heparin drip -Neurology recommendations: anticoagulation for 2-3 months then repeat neck, 30 day cardiac event monitor outpatient -CTA neck scheduled for 10/7  Asymmetric ventricular hypertrophy Seen on Transthoracic Echocardiogram. Will need Cardiac MRI for follow-up.  Essential hypertension Elevated. Patient is on amlodipine, hydrochlorothiazide, losartan as an outpatient -Hold antihypertensives  Diabetes mellitus, type 2 Hemoglobin A1C of 7.8%. patient is on Toujeo, Humalog as an outpatient. -Hold Lantus since patient's blood sugar in the 90s -Continue SSI  Hyperlipidemia -Continue atorvastatin  Essential tremor Patient is s/p deep brain stimulator placement earlier this year in June of 2020.  OSA On  CPAP -Continue CPAP qhs  CKD stage 3 -Stable   DVT prophylaxis: Heparin drip Code Status:   Code Status: Full Code Family Communication: None at bedside Disposition Plan: Discharge pending neuro workup and transition to oral anticoagulation   Consultants:   Neurology  Vascular surgery  Procedures:   10/5: Transthoracic Echocardiogram IMPRESSIONS    1. Technically difficult study. Left ventricular ejection fraction appears grossly normal, approximately 55-60%, though difficult visualization even with contrast  2. There is asymmetric basal septal hypertrophy measuring 18 mm in basal septum (12 mm posterior wall). Consider cardiac MRI to assess for hypertrophic cardiomyopathy if clinically indicated  3. Definity contrast agent was given IV to delineate the left ventricular endocardial borders.  4. Global right ventricle has normal systolic function.The right ventricular size is normal. No increase in right ventricular wall thickness.  5. There is mild dilatation of the aortic root measuring 39 mm.  6. The inferior vena cava is dilated in size with <50% respiratory variability, suggesting right atrial pressure of 15 mmHg.  FINDINGS  Left Ventricle: Left ventricular ejection fraction, by visual estimation, is 55 to 60%. The left ventricle has normal function. Definity contrast agent was given IV to delineate the left ventricular endocardial borders. There is moderately increased  left ventricular hypertrophy. Asymmetric left ventricular hypertrophy. Spectral Doppler shows Left ventricular diastolic Doppler parameters are indeterminate pattern of LV diastolic filling.  Right Ventricle: The right ventricular size is normal. No increase in right ventricular wall thickness. Global RV systolic function is has normal systolic function.  Left Atrium: Left atrial size was not well visualized.  Right Atrium: Right atrial size was not well visualized  Pericardium: Trivial  pericardial effusion is present.  Mitral Valve: The mitral valve is normal in structure. No evidence of mitral valve regurgitation.  Tricuspid Valve: The  tricuspid valve is not well visualized. Tricuspid valve regurgitation is trivial by color flow Doppler.  Aortic Valve: The aortic valve was not well visualized. Aortic valve regurgitation was not visualized by color flow Doppler. Mild calcification.  Pulmonic Valve: The pulmonic valve was not well visualized. Pulmonic valve regurgitation is not visualized by color flow Doppler.  Aorta: Aortic dilatation noted. There is mild dilatation of the aortic root measuring 39 mm.  Venous: The inferior vena cava is dilated in size with less than 50% respiratory variability, suggesting right atrial pressure of 15 mmHg.  IAS/Shunts: The interatrial septum was not well visualized.  Antimicrobials:  None    Subjective: Feels his speech is a little improved but does have some trouble with thin liquids  Objective: Vitals:   08/29/19 1641 08/29/19 1902 08/29/19 2339 08/30/19 0345  BP: (!) 155/92 (!) 151/92 (!) 150/96 (!) 145/85  Pulse:  78 83 80  Resp: 19 18 17 18   Temp: 98.2 F (36.8 C) 97.8 F (36.6 C) 98.4 F (36.9 C) (!) 97.5 F (36.4 C)  TempSrc: Oral Oral Oral Oral  SpO2: 98% 98% 100% 98%  Weight:      Height:        Intake/Output Summary (Last 24 hours) at 08/30/2019 0635 Last data filed at 08/30/2019 0600 Gross per 24 hour  Intake 1910.77 ml  Output 625 ml  Net 1285.77 ml   Filed Weights   08/28/19 1246  Weight: 119.7 kg    Examination:  General exam: Appears calm and comfortable Respiratory system: Clear to auscultation. Respiratory effort normal. Cardiovascular system: S1 & S2 heard, RRR. No murmurs, rubs, gallops or clicks. Gastrointestinal system: Abdomen is nondistended, soft and nontender. No organomegaly or masses felt. Normal bowel sounds heard. Central nervous system: Alert and oriented. Mild  dysarthria. Extremities: No edema. No calf tenderness Skin: No cyanosis. No rashes Psychiatry: Judgement and insight appear normal. Mood & affect appropriate.      Data Reviewed: I have personally reviewed following labs and imaging studies  CBC: Recent Labs  Lab 08/28/19 1300 08/28/19 1316 08/29/19 0409  WBC 8.1  --  8.1  NEUTROABS 4.1  --   --   HGB 12.9* 13.3 13.2  HCT 38.8* 39.0 39.5  MCV 89.6  --  91.2  PLT 281  --  [id number]   Basic Metabolic Panel: Recent Labs  Lab 08/28/19 1300 08/28/19 1316  NA 140 141  K 4.2 4.2  CL 105 105  CO2 27  --   GLUCOSE 196* 198*  BUN 25* 31*  CREATININE 1.51* 1.40*  CALCIUM 9.3  --    GFR: Estimated Creatinine Clearance: 69.4 mL/min (A) (by C-G formula based on SCr of 1.4 mg/dL (H)). Liver Function Tests: Recent Labs  Lab 08/28/19 1300  AST 22  ALT 16  ALKPHOS 81  BILITOT 0.5  PROT 6.7  ALBUMIN 3.3*   No results for input(s): LIPASE, AMYLASE in the last 168 hours. No results for input(s): AMMONIA in the last 168 hours. Coagulation Profile: Recent Labs  Lab 08/28/19 1300  INR 1.1   Cardiac Enzymes: No results for input(s): CKTOTAL, CKMB, CKMBINDEX, TROPONINI in the last 168 hours. BNP (last 3 results) No results for input(s): PROBNP in the last 8760 hours. HbA1C: Recent Labs    08/29/19 0409  HGBA1C 7.8*   CBG: Recent Labs  Lab 08/28/19 1928 08/28/19 2212 08/29/19 1315 08/29/19 1711 08/29/19 2145  GLUCAP 122* 103* 92 147* 197*   Lipid Profile: Recent Labs  08/29/19 0409  CHOL 183  HDL 26*  LDLCALC 107*  TRIG 249*  CHOLHDL 7.0   Thyroid Function Tests: No results for input(s): TSH, T4TOTAL, FREET4, T3FREE, THYROIDAB in the last 72 hours. Anemia Panel: No results for input(s): VITAMINB12, FOLATE, FERRITIN, TIBC, IRON, RETICCTPCT in the last 72 hours. Sepsis Labs: No results for input(s): PROCALCITON, LATICACIDVEN in the last 168 hours.  Recent Results (from the past 240 hour(s))  SARS  CORONAVIRUS 2 (TAT 6-24 HRS) Nasopharyngeal Urine, Clean Catch     Status: None   Collection Time: 08/28/19  2:13 PM   Specimen: Urine, Clean Catch; Nasopharyngeal  Result Value Ref Range Status   SARS Coronavirus 2 NEGATIVE NEGATIVE Final    Comment: (NOTE) SARS-CoV-2 target nucleic acids are NOT DETECTED. The SARS-CoV-2 RNA is generally detectable in upper and lower respiratory specimens during the acute phase of infection. Negative results do not preclude SARS-CoV-2 infection, do not rule out co-infections with other pathogens, and should not be used as the sole basis for treatment or other patient management decisions. Negative results must be combined with clinical observations, patient history, and epidemiological information. The expected result is Negative. Fact Sheet for Patients: SugarRoll.be Fact Sheet for Healthcare Providers: https://www.woods-mathews.com/ This test is not yet approved or cleared by the [place] FDA and  has been authorized for detection and/or diagnosis of SARS-CoV-2 by FDA under an Emergency Use Authorization (EUA). This EUA will remain  in effect (meaning this test can be used) for the duration of the COVID-19 declaration under Section 56 4(b)(1) of the Act, 21 U.S.C. section 360bbb-3(b)(1), unless the authorization is terminated or revoked sooner. Performed at [facility], [place] [address] [place], [place] [address]          Radiology Studies: Ct Code Stroke Cta Head W/wo Contrast  Addendum Date: 08/28/2019   ADDENDUM REPORT: 08/28/2019 15:19 ADDENDUM: Study reviewed by telephone with Dr. Roland Rack on 08/28/2019 at 15:15. He states that clinically he is suspicious for a small cerebellar infarct, which makes the suspected small thrombus in the proximal left subclavian artery more likely to be the dominant finding. We discussed that the left MCA middle division M3 branch appearance might  be related to streak artifact from the DBS. Electronically Signed   By: Genevie Ann M.D.   On: 08/28/2019 15:19   Result Date: 08/28/2019 CLINICAL DATA:  68 year old male with slurred speech. History of deep brain stimulator. EXAM: CT ANGIOGRAPHY HEAD AND NECK CT PERFUSION BRAIN TECHNIQUE: Multidetector CT imaging of the head and neck was performed using the standard protocol during bolus administration of intravenous contrast. Multiplanar CT image reconstructions and MIPs were obtained to evaluate the vascular anatomy. Carotid stenosis measurements (when applicable) are obtained utilizing NASCET criteria, using the distal internal carotid diameter as the denominator. Multiphase CT imaging of the brain was performed following IV bolus contrast injection. Subsequent parametric perfusion maps were calculated using RAPID software. CONTRAST:  162mL OMNIPAQUE IOHEXOL 350 MG/ML SOLN COMPARISON:  Head CT 07/07/2019 and earlier. CTA head and neck 04/09/2011. FINDINGS: CT HEAD FINDINGS Brain: Resolved postoperative pneumocephalus since last month. The left deep brain stimulator device terminates at the left thalamus as before. No acute intracranial hemorrhage identified. No midline shift, mass effect, or evidence of intracranial mass lesion. No ventriculomegaly. Evidence of a chronic right thalamic lacunar infarct is unchanged. Patchy white matter hypodensity in both hemispheres appears stable. No cortically based acute infarct identified. Vascular: Calcified atherosclerosis at the skull base. Skull: Vertex  burr holes.  No acute osseous abnormality identified. Sinuses/Orbits: Visualized paranasal sinuses and mastoids are stable and well pneumatized. Other: Left vertex approach electrical lead with removed skin staples and resolved scalp gas since the postoperative CT last month. Electrode has now been tunneled across the vertex into the right scalp and neck. Stable orbits soft tissues, with a small preseptal infraorbital  calcification or retained foreign body as before. This has been present since 2012, with 2 successful brain MRI since that time. ASPECTS [facility] Stroke Program Early CT Score) Total score (0-10 with 10 being normal): 10 Review of the MIP images confirms the above findings CT Brain Perfusion Findings: CBF (<30%) Volume: 0 mL Perfusion (Tmax>6.0s) volume: 0 mL Mismatch Volume: Not applicable Infarction Location:Not applicable CTA NECK Skeleton: No acute osseous abnormality identified. Multilevel cervical spine ankylosis in part related to flowing anterior osteophytes. Upper chest: Right chest generator device now in place. Otherwise negative. Other neck: Right neck stimulator lead tracking to the vertex, no adverse features. Otherwise stable since 2012. Aortic arch: 3 vessel arch configuration. Mild arch calcified plaque. Mild arch ectasia. Right carotid system: Tortuous brachiocephalic artery and proximal right CCA without stenosis. Calcified plaque at the right ICA origin and bulb has not significantly changed since 2012, less than 50 % stenosis with respect to the distal vessel. Highly tortuous cervical right ICA without stenosis to the skull base. Left carotid system: No left CCA origin plaque or stenosis. Calcified plaque at the left ICA origin and bulb has mildly progressed since 2012. Less than 50 % stenosis with respect to the distal vessel. Highly tortuous cervical left ICA which is mildly dolichoectatic to the skull base. Vertebral arteries: Mild plaque in the proximal right subclavian artery without stenosis. Normal right vertebral artery origin. Patent right vertebral artery to the skull base without stenosis. Right V2 tortuosity. 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 There is nearby soft and calcified plaque which appears more stable. There is no proximal left subclavian artery stenosis. The left vertebral artery origin  remains patent with mild to moderate stenosis (series 13, image 170). The left vertebral artery is tortuous, mildly dominant, and patent to the skull base without additional stenosis. Tortuous left V1 segment CTA HEAD Posterior circulation: No distal vertebral artery stenosis, the left is mildly dominant. Normal right PICA origin. Patent vertebrobasilar junction without stenosis. Patent basilar artery without stenosis. The left AICA may be dominant, unchanged. Patent SCA and PCA origins. Mild right P1 irregularity and stenosis is new on series 16, image 27. Bilateral PCA branches are otherwise stable and without significant stenosis. Anterior circulation: Both ICA siphons are patent. On the left there is mild calcified plaque without stenosis. Normal right ophthalmic artery origin. On the right there is mild to moderate calcified plaque without stenosis. Normal right ophthalmic artery origin. Posterior communicating arteries are diminutive or absent. Dominant left ACA A1 segment. MCA and left ACA origins are mildly ectatic. Diminutive right A1. Anterior communicating artery is mildly ectatic. Bilateral ACA branches are stable and within normal limits. Left MCA M1 and bifurcation are patent without stenosis. Suggestion of moderate new left MCA middle in 3 branch irregularity and stenosis since 2012. Right MCA M1 segment, bifurcation, and right MCA branches appear patent and stable since 2012. Venous sinuses: Patent. Anatomic variants: Mildly dominant left vertebral artery. Dominant left and diminutive right ACA A1 segments. Review of the MIP images confirms the above  findings IMPRESSION: 1. Negative for large vessel occlusion, and no core infarct or ischemic penumbra identified by CT Perfusion. 2. Positive for a small 5 mm focus of adherent thrombus in the proximal left subclavian artery on series 10, image 169. Nearby chronic left subclavian plaque. 3. No large vessel stenosis in the head or neck, but suggestion of  Left MCA middle division M3 branch moderate irregularity and stenosis since 2012. Unclear if this might be related to a possible recent thromboembolic event implied by #2. 4. Satisfactory postoperative CT appearance of the brain following left deep brain stimulator device in August. 5. Bilateral carotid atherosclerosis with mild progression since 2012. No significant stenosis. Study discussed by telephone with Dr. Roland Rack on 08/28/2019 at 14:30 . Electronically Signed: By: Genevie Ann M.D. On: 08/28/2019 14:31   Ct Code Stroke Cta Neck W/wo Contrast  Addendum Date: 08/28/2019   ADDENDUM REPORT: 08/28/2019 15:19 ADDENDUM: Study reviewed by telephone with Dr. Roland Rack on 08/28/2019 at 15:15. He states that clinically he is suspicious for a small cerebellar infarct, which makes the suspected small thrombus in the proximal left subclavian artery more likely to be the dominant finding. We discussed that the left MCA middle division M3 branch appearance might be related to streak artifact from the DBS. Electronically Signed   By: Genevie Ann M.D.   On: 08/28/2019 15:19   Result Date: 08/28/2019 CLINICAL DATA:  68 year old male with slurred speech. History of deep brain stimulator. EXAM: CT ANGIOGRAPHY HEAD AND NECK CT PERFUSION BRAIN TECHNIQUE: Multidetector CT imaging of the head and neck was performed using the standard protocol during bolus administration of intravenous contrast. Multiplanar CT image reconstructions and MIPs were obtained to evaluate the vascular anatomy. Carotid stenosis measurements (when applicable) are obtained utilizing NASCET criteria, using the distal internal carotid diameter as the denominator. Multiphase CT imaging of the brain was performed following IV bolus contrast injection. Subsequent parametric perfusion maps were calculated using RAPID software. CONTRAST:  165mL OMNIPAQUE IOHEXOL 350 MG/ML SOLN COMPARISON:  Head CT 07/07/2019 and earlier. CTA head and neck  04/09/2011. FINDINGS: CT HEAD FINDINGS Brain: Resolved postoperative pneumocephalus since last month. The left deep brain stimulator device terminates at the left thalamus as before. No acute intracranial hemorrhage identified. No midline shift, mass effect, or evidence of intracranial mass lesion. No ventriculomegaly. Evidence of a chronic right thalamic lacunar infarct is unchanged. Patchy white matter hypodensity in both hemispheres appears stable. No cortically based acute infarct identified. Vascular: Calcified atherosclerosis at the skull base. Skull: Vertex burr holes.  No acute osseous abnormality identified. Sinuses/Orbits: Visualized paranasal sinuses and mastoids are stable and well pneumatized. Other: Left vertex approach electrical lead with removed skin staples and resolved scalp gas since the postoperative CT last month. Electrode has now been tunneled across the vertex into the right scalp and neck. Stable orbits soft tissues, with a small preseptal infraorbital calcification or retained foreign body as before. This has been present since 2012, with 2 successful brain MRI since that time. ASPECTS [facility] Stroke Program Early CT Score) Total score (0-10 with 10 being normal): 10 Review of the MIP images confirms the above findings CT Brain Perfusion Findings: CBF (<30%) Volume: 0 mL Perfusion (Tmax>6.0s) volume: 0 mL Mismatch Volume: Not applicable Infarction Location:Not applicable CTA NECK Skeleton: No acute osseous abnormality identified. Multilevel cervical spine ankylosis in part related to flowing anterior osteophytes. Upper chest: Right chest generator device now in place. Otherwise negative. Other neck: Right neck stimulator lead tracking to  the vertex, no adverse features. Otherwise stable since 2012. Aortic arch: 3 vessel arch configuration. Mild arch calcified plaque. Mild arch ectasia. Right carotid system: Tortuous brachiocephalic artery and proximal right CCA without stenosis. Calcified  plaque at the right ICA origin and bulb has not significantly changed since 2012, less than 50 % stenosis with respect to the distal vessel. Highly tortuous cervical right ICA without stenosis to the skull base. Left carotid system: No left CCA origin plaque or stenosis. Calcified plaque at the left ICA origin and bulb has mildly progressed since 2012. Less than 50 % stenosis with respect to the distal vessel. Highly tortuous cervical left ICA which is mildly dolichoectatic to the skull base. Vertebral arteries: Mild plaque in the proximal right subclavian artery without stenosis. Normal right vertebral artery origin. Patent right vertebral artery to the skull base without stenosis. Right V2 tortuosity. 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 There is nearby soft and calcified plaque which appears more stable. There is no proximal left subclavian artery stenosis. The left vertebral artery origin remains patent with mild to moderate stenosis (series 13, image 170). The left vertebral artery is tortuous, mildly dominant, and patent to the skull base without additional stenosis. Tortuous left V1 segment CTA HEAD Posterior circulation: No distal vertebral artery stenosis, the left is mildly dominant. Normal right PICA origin. Patent vertebrobasilar junction without stenosis. Patent basilar artery without stenosis. The left AICA may be dominant, unchanged. Patent SCA and PCA origins. Mild right P1 irregularity and stenosis is new on series 16, image 27. Bilateral PCA branches are otherwise stable and without significant stenosis. Anterior circulation: Both ICA siphons are patent. On the left there is mild calcified plaque without stenosis. Normal right ophthalmic artery origin. On the right there is mild to moderate calcified plaque without stenosis. Normal right ophthalmic artery origin. Posterior communicating arteries are diminutive or  absent. Dominant left ACA A1 segment. MCA and left ACA origins are mildly ectatic. Diminutive right A1. Anterior communicating artery is mildly ectatic. Bilateral ACA branches are stable and within normal limits. Left MCA M1 and bifurcation are patent without stenosis. Suggestion of moderate new left MCA middle in 3 branch irregularity and stenosis since 2012. Right MCA M1 segment, bifurcation, and right MCA branches appear patent and stable since 2012. Venous sinuses: Patent. Anatomic variants: Mildly dominant left vertebral artery. Dominant left and diminutive right ACA A1 segments. Review of the MIP images confirms the above findings IMPRESSION: 1. Negative for large vessel occlusion, and no core infarct or ischemic penumbra identified by CT Perfusion. 2. Positive for a small 5 mm focus of adherent thrombus in the proximal left subclavian artery on series 10, image 169. Nearby chronic left subclavian plaque. 3. No large vessel stenosis in the head or neck, but suggestion of Left MCA middle division M3 branch moderate irregularity and stenosis since 2012. Unclear if this might be related to a possible recent thromboembolic event implied by #2. 4. Satisfactory postoperative CT appearance of the brain following left deep brain stimulator device in August. 5. Bilateral carotid atherosclerosis with mild progression since 2012. No significant stenosis. Study discussed by telephone with Dr. Roland Rack on 08/28/2019 at 14:30 . Electronically Signed: By: Genevie Ann M.D. On: 08/28/2019 14:31   Ct Code Stroke Cta Cerebral Perfusion W/wo Contrast  Addendum Date: 08/28/2019   ADDENDUM REPORT: 08/28/2019 15:19 ADDENDUM: Study reviewed by telephone with Dr. Roland Rack  on 08/28/2019 at 15:15. He states that clinically he is suspicious for a small cerebellar infarct, which makes the suspected small thrombus in the proximal left subclavian artery more likely to be the dominant finding. We discussed that the left  MCA middle division M3 branch appearance might be related to streak artifact from the DBS. Electronically Signed   By: Genevie Ann M.D.   On: 08/28/2019 15:19   Result Date: 08/28/2019 CLINICAL DATA:  68 year old male with slurred speech. History of deep brain stimulator. EXAM: CT ANGIOGRAPHY HEAD AND NECK CT PERFUSION BRAIN TECHNIQUE: Multidetector CT imaging of the head and neck was performed using the standard protocol during bolus administration of intravenous contrast. Multiplanar CT image reconstructions and MIPs were obtained to evaluate the vascular anatomy. Carotid stenosis measurements (when applicable) are obtained utilizing NASCET criteria, using the distal internal carotid diameter as the denominator. Multiphase CT imaging of the brain was performed following IV bolus contrast injection. Subsequent parametric perfusion maps were calculated using RAPID software. CONTRAST:  193mL OMNIPAQUE IOHEXOL 350 MG/ML SOLN COMPARISON:  Head CT 07/07/2019 and earlier. CTA head and neck 04/09/2011. FINDINGS: CT HEAD FINDINGS Brain: Resolved postoperative pneumocephalus since last month. The left deep brain stimulator device terminates at the left thalamus as before. No acute intracranial hemorrhage identified. No midline shift, mass effect, or evidence of intracranial mass lesion. No ventriculomegaly. Evidence of a chronic right thalamic lacunar infarct is unchanged. Patchy white matter hypodensity in both hemispheres appears stable. No cortically based acute infarct identified. Vascular: Calcified atherosclerosis at the skull base. Skull: Vertex burr holes.  No acute osseous abnormality identified. Sinuses/Orbits: Visualized paranasal sinuses and mastoids are stable and well pneumatized. Other: Left vertex approach electrical lead with removed skin staples and resolved scalp gas since the postoperative CT last month. Electrode has now been tunneled across the vertex into the right scalp and neck. Stable orbits soft  tissues, with a small preseptal infraorbital calcification or retained foreign body as before. This has been present since 2012, with 2 successful brain MRI since that time. ASPECTS [facility] Stroke Program Early CT Score) Total score (0-10 with 10 being normal): 10 Review of the MIP images confirms the above findings CT Brain Perfusion Findings: CBF (<30%) Volume: 0 mL Perfusion (Tmax>6.0s) volume: 0 mL Mismatch Volume: Not applicable Infarction Location:Not applicable CTA NECK Skeleton: No acute osseous abnormality identified. Multilevel cervical spine ankylosis in part related to flowing anterior osteophytes. Upper chest: Right chest generator device now in place. Otherwise negative. Other neck: Right neck stimulator lead tracking to the vertex, no adverse features. Otherwise stable since 2012. Aortic arch: 3 vessel arch configuration. Mild arch calcified plaque. Mild arch ectasia. Right carotid system: Tortuous brachiocephalic artery and proximal right CCA without stenosis. Calcified plaque at the right ICA origin and bulb has not significantly changed since 2012, less than 50 % stenosis with respect to the distal vessel. Highly tortuous cervical right ICA without stenosis to the skull base. Left carotid system: No left CCA origin plaque or stenosis. Calcified plaque at the left ICA origin and bulb has mildly progressed since 2012. Less than 50 % stenosis with respect to the distal vessel. Highly tortuous cervical left ICA which is mildly dolichoectatic to the skull base. Vertebral arteries: Mild plaque in the proximal right subclavian artery without stenosis. Normal right vertebral artery origin. Patent right vertebral artery to the skull base without stenosis. Right V2 tortuosity. There is a small focus of adherent thrombus in the proximal left subclavian artery on series  10, image 169, new since 2012. There is nearby soft and calcified plaque which appears more stable. There is no proximal left subclavian  artery stenosis. The left vertebral artery origin remains patent with mild to moderate stenosis (series 13, image 170). The left vertebral artery is tortuous, mildly dominant, and patent to the skull base without additional stenosis. Tortuous left V1 segment CTA HEAD Posterior circulation: No distal vertebral artery stenosis, the left is mildly dominant. Normal right PICA origin. Patent vertebrobasilar junction without stenosis. Patent basilar artery without stenosis. The left AICA may be dominant, unchanged. Patent SCA and PCA origins. Mild right P1 irregularity and stenosis is new on series 16, image 27. Bilateral PCA branches are otherwise stable and without significant stenosis. Anterior circulation: Both ICA siphons are patent. On the left there is mild calcified plaque without stenosis. Normal right ophthalmic artery origin. On the right there is mild to moderate calcified plaque without stenosis. Normal right ophthalmic artery origin. Posterior communicating arteries are diminutive or absent. Dominant left ACA A1 segment. MCA and left ACA origins are mildly ectatic. Diminutive right A1. Anterior communicating artery is mildly ectatic. Bilateral ACA branches are stable and within normal limits. Left MCA M1 and bifurcation are patent without stenosis. Suggestion of moderate new left MCA middle in 3 branch irregularity and stenosis since 2012. Right MCA M1 segment, bifurcation, and right MCA branches appear patent and stable since 2012. Venous sinuses: Patent. Anatomic variants: Mildly dominant left vertebral artery. Dominant left and diminutive right ACA A1 segments. Review of the MIP images confirms the above findings IMPRESSION: 1. Negative for large vessel occlusion, and no core infarct or ischemic penumbra identified by CT Perfusion. 2. Positive for a small 5 mm focus of adherent thrombus in the proximal left subclavian artery on series 10, image 169. Nearby chronic left subclavian plaque. 3. No large  vessel stenosis in the head or neck, but suggestion of Left MCA middle division M3 branch moderate irregularity and stenosis since 2012. Unclear if this might be related to a possible recent thromboembolic event implied by #2. 4. Satisfactory postoperative CT appearance of the brain following left deep brain stimulator device in August. 5. Bilateral carotid atherosclerosis with mild progression since 2012. No significant stenosis. Study discussed by telephone with Dr. Roland Rack on 08/28/2019 at 14:30 . Electronically Signed: By: Genevie Ann M.D. On: 08/28/2019 14:31        Scheduled Meds:   stroke: mapping our early stages of recovery book   Does not apply Once   atorvastatin  80 mg Oral q1800   insulin aspart  0-15 Units Subcutaneous TID WC   insulin aspart  0-5 Units Subcutaneous QHS   Continuous Infusions:  sodium chloride Stopped (08/28/19 2003)   sodium chloride 75 mL/hr at 08/29/19 2320   heparin 2,200 Units/hr (08/30/19 0016)     LOS: 2 days     Cordelia Poche, MD [facility] 08/30/2019, 6:35 AM  If 7PM-7AM, please contact night-coverage [email or web address]

## 2019-08-31 ENCOUNTER — Inpatient Hospital Stay (HOSPITAL_COMMUNITY): Payer: Medicare Other

## 2019-08-31 ENCOUNTER — Other Ambulatory Visit: Payer: Self-pay | Admitting: Cardiology

## 2019-08-31 DIAGNOSIS — I639 Cerebral infarction, unspecified: Secondary | ICD-10-CM

## 2019-08-31 LAB — CBC
HCT: 38.1 % — ABNORMAL LOW (ref 39.0–52.0)
Hemoglobin: 12.9 g/dL — ABNORMAL LOW (ref 13.0–17.0)
MCH: 30.1 pg (ref 26.0–34.0)
MCHC: 33.9 g/dL (ref 30.0–36.0)
MCV: 89 fL (ref 80.0–100.0)
Platelets: 270 10*3/uL (ref 150–400)
RBC: 4.28 MIL/uL (ref 4.22–5.81)
RDW: 12.8 % (ref 11.5–15.5)
WBC: 8 10*3/uL (ref 4.0–10.5)
nRBC: 0 % (ref 0.0–0.2)

## 2019-08-31 LAB — BASIC METABOLIC PANEL
Anion gap: 9 (ref 5–15)
BUN: 19 mg/dL (ref 8–23)
CO2: 24 mmol/L (ref 22–32)
Calcium: 9.5 mg/dL (ref 8.9–10.3)
Chloride: 107 mmol/L (ref 98–111)
Creatinine, Ser: 1.33 mg/dL — ABNORMAL HIGH (ref 0.61–1.24)
GFR calc Af Amer: >60 mL/min (ref >60)
GFR calc non Af Amer: 54 mL/min — ABNORMAL LOW (ref >60)
Glucose, Bld: 199 mg/dL — ABNORMAL HIGH (ref 70–99)
Potassium: 3.9 mmol/L (ref 3.5–5.1)
Sodium: 140 mmol/L (ref 135–145)

## 2019-08-31 LAB — GLUCOSE, CAPILLARY
Glucose-Capillary: 192 mg/dL — ABNORMAL HIGH (ref 70–99)
Glucose-Capillary: 206 mg/dL — ABNORMAL HIGH (ref 70–99)
Glucose-Capillary: 200 mg/dL — ABNORMAL HIGH (ref 70–99)

## 2019-08-31 LAB — HEPARIN LEVEL (UNFRACTIONATED): Heparin Unfractionated: 0.41 IU/mL (ref 0.30–0.70)

## 2019-08-31 MED ORDER — HYDROCHLOROTHIAZIDE 25 MG PO TABS
25.0000 mg | ORAL_TABLET | Freq: Every day | ORAL | Status: DC
  Administered 2019-08-31: 25 mg via ORAL
  Filled 2019-08-31: qty 1

## 2019-08-31 MED ORDER — ATORVASTATIN CALCIUM 80 MG PO TABS: 80.0000 mg | ORAL_TABLET | Freq: Every day | ORAL | 2 refills | Status: DC

## 2019-08-31 MED ORDER — INSULIN GLARGINE 100 UNIT/ML ~~LOC~~ SOLN
20.0000 [IU] | Freq: Every day | SUBCUTANEOUS | Status: DC
  Administered 2019-08-31: 20 [IU] via SUBCUTANEOUS
  Filled 2019-08-31: qty 0.2

## 2019-08-31 MED ORDER — AMLODIPINE BESYLATE 5 MG PO TABS
5.0000 mg | ORAL_TABLET | Freq: Every day | ORAL | Status: DC
  Administered 2019-08-31: 5 mg via ORAL
  Filled 2019-08-31: qty 1

## 2019-08-31 MED ORDER — APIXABAN 5 MG PO TABS
5.0000 mg | ORAL_TABLET | Freq: Two times a day (BID) | ORAL | Status: DC
  Administered 2019-08-31: 13:00:00 5 mg via ORAL
  Filled 2019-08-31: qty 1

## 2019-08-31 MED ORDER — APIXABAN 5 MG PO TABS: 5.0000 mg | ORAL_TABLET | Freq: Two times a day (BID) | ORAL | 1 refills | Status: DC

## 2019-08-31 NOTE — Progress Notes (Signed)
Benefits check for Eliquis 5 mg BID submitted. TOC following.

## 2019-08-31 NOTE — Progress Notes (Signed)
STROKE TEAM PROGRESS NOTE   INTERVAL HISTORY Pt wife at bedside.  Patient sitting in chair, no distress.  Speech continues to improve.  Moving all extremities.  MRI finally done showed right BG small infarct.  No posterior circulation infarct on MRI.  However, his presentation more consistent with posterior circulation infarct, likely MRI negative stroke.  Discussed with Dr. Carlis Abbott and Dr. Loleta Books, no need to repeat CTA neck at this time, continue anticoagulation for 2 to 3 months and then repeat CTA neck.  Vitals:   08/31/19 0044 08/31/19 0349 08/31/19 0824 08/31/19 1207  BP: (!) 193/93 (!) 170/98 (!) 156/92 (!) 167/93  Pulse: 82 89 87 82  Resp: 18 17 20 20   Temp: 98.4 F (36.9 C) 97.7 F (36.5 C) (!) 97.4 F (36.3 C) 98.6 F (37 C)  TempSrc: Oral Oral Oral Oral  SpO2: 97% 97% 98% 99%  Weight:      Height:        CBC:  Recent Labs  Lab 08/28/19 1300  08/30/19 0651 08/31/19 0336  WBC 8.1   < > 7.7 8.0  NEUTROABS 4.1  --   --   --   HGB 12.9*   < > 11.9* 12.9*  HCT 38.8*   < > 35.8* 38.1*  MCV 89.6   < > 89.5 89.0  PLT 281   < > 264 270   < > = values in this interval not displayed.    Basic Metabolic Panel:  Recent Labs  Lab 08/28/19 1300 08/28/19 1316 08/31/19 0336  NA 140 141 140  K 4.2 4.2 3.9  CL 105 105 107  CO2 27  --  24  GLUCOSE 196* 198* 199*  BUN 25* 31* 19  CREATININE 1.51* 1.40* 1.33*  CALCIUM 9.3  --  9.5   Lipid Panel:     Component Value Date/Time   CHOL 183 08/29/2019 0409   TRIG 249 (H) 08/29/2019 0409   HDL 26 (L) 08/29/2019 0409   CHOLHDL 7.0 08/29/2019 0409   VLDL 50 (H) 08/29/2019 0409   LDLCALC 107 (H) 08/29/2019 0409   HgbA1c:  Lab Results  Component Value Date   HGBA1C 7.8 (H) 08/29/2019   Urine Drug Screen:     Component Value Date/Time   LABOPIA NONE DETECTED 08/28/2019 1413   COCAINSCRNUR NONE DETECTED 08/28/2019 1413   LABBENZ NONE DETECTED 08/28/2019 1413   AMPHETMU NONE DETECTED 08/28/2019 1413   THCU NONE DETECTED  08/28/2019 1413   LABBARB NONE DETECTED 08/28/2019 1413    Alcohol Level No results found for: [facility]  IMAGING Mr Brain Wo Contrast  Result Date: 08/30/2019 CLINICAL DATA:  Stroke. Slurred speech and weakness. Deep brain stimulator EXAM: MRI HEAD WITHOUT CONTRAST TECHNIQUE: Multiplanar, multiecho pulse sequences of the brain and surrounding structures were obtained without intravenous contrast. COMPARISON:  CT head 08/28/2019 FINDINGS: Brain: Unilateral deep brain stimulator on the left causing mild artifact. Acute infarct posterior limb internal capsule on the right measuring approximately 10 x 15 mm. No other acute infarct. Generalized atrophy with chronic microvascular ischemic changes in the white matter. Chronic infarct right thalamus. Negative for hemorrhage or mass. Vascular: Normal arterial flow voids Skull and upper cervical spine: Negative Sinuses/Orbits: Mild mucosal edema paranasal sinuses. Bilateral ocular surgery Other: None IMPRESSION: Acute infarct right internal capsule. Chronic microvascular ischemic changes in the white matter and right thalamus Left-sided deep brain stimulator. Electronically Signed   By: Franchot Gallo M.D.   On: 08/30/2019 17:48    PHYSICAL  EXAM     Temp:  [97.4 F (36.3 C)-99.1 F (37.3 C)] 98.6 F (37 C) (10/07 1207) Pulse Rate:  [81-95] 82 (10/07 1207) Resp:  [17-20] 20 (10/07 1207) BP: (156-193)/(90-99) 167/93 (10/07 1207) SpO2:  [97 %-100 %] 99 % (10/07 1207)  General - Well nourished, well developed, in no apparent distress.  Ophthalmologic - fundi not visualized due to noncooperation.  Cardiovascular - Regular rhythm and rate.  Mental Status -  Level of arousal and orientation to time, place, and person were intact. Language including expression, naming, repetition, comprehension was assessed and found intact. Fund of Knowledge was assessed and was intact. However, scanning speech  Cranial Nerves II - XII - II - Visual field intact  OU. III, IV, VI - Extraocular movements intact. V - Facial sensation intact bilaterally. VII - Facial movement intact bilaterally. VIII - Hearing & vestibular intact bilaterally. X - Palate elevates symmetrically. Scanning speech much improved XI - Chin turning & shoulder shrug intact bilaterally. XII - Tongue protrusion intact.  Motor Strength - The patient's strength was normal in all extremities and pronator drift was absent.  Bulk was normal and fasciculations were absent.   Motor Tone - Muscle tone was assessed at the neck and appendages and was normal.  Reflexes - The patient's reflexes were symmetrical in all extremities and he had no pathological reflexes.  Sensory - Light touch, temperature/pinprick were assessed and were symmetrical.    Coordination - The patient had normal movements in the hands and feet with no obvious ataxia or dysmetria.  Mild essential was present more on the right hand DBS turned.  Gait and Station - deferred.   ASSESSMENT/PLAN [name] is a 68 y.o. male with history of HTN, HLD, DB presenting with swallowing difficulty, scanning speech, dysarthria, mild Larm>L leg ataxia.   Stroke:  Right BG/IC infarct and likely MRI negative posterior circultation infarct, still more likely embolic secondary to L subclavian thrombus  CT head L thalamic deep brain stimulator in place. No hemorrhage. Moderate to moderate penrumocephalus anteriorly bilaterally. Chronic ischemic changes.   CTA head & neck no ELVO. suspected thrombus proximal L subclavian. L DBS intact. B ICA atherosclerosis with mild progression since 2012.   CT perfusion no core infarct or penumbra  MRI R internal capsule infarct. Small vessel disease in white matter and R thalamus. L DBS.  2D Echo EF 55-60%. No source of embolus   Recommend OP 30d monitor arranged by cardiology  LDL 107  HgbA1c 7.8  IV heparinfor VTE prophylaxis  aspirin 81 mg daily prior to admission, now on  Eliquis.  Recommend to continue eliquis for 2-3 months and then repeat CTA neck, if subclavian artery thrombus resolves, can switch to antiplatelet.   Therapy recommendations:  No therapy needs  Disposition:  Return home  Follow up with Dr. Carles Collet at d/c for neuro care  L Subclavian Artery Thrombus  VVS consulted Carlis Abbott)  CTA head & neck  suspected thrombus proximal L subclavian.   IV heparin -> eliquis  Not sure source of thrombus, cardioembolic vs. In situ  Repeat CTA neck in 2-3 months.  OP 30 day cardiac event monitoring arranged as outpt to rule out afib  Hypertension  Stable . Permissive hypertension (OK if < 180/105) but gradually normalize in 5-7 days . Long-term BP goal normotensive  Hyperlipidemia  Home meds:  liptor 40 and fish oil  Now on lipitor 80   resumed in hospital  LDL 107, goal <  69  Continue statin at discharge  Diabetes type II Uncontrolled  HgbA1c 7.8, goal < 7.0  CBGs  SSI  Close PCP follow up  Other Stroke Risk Factors  Advanced age  Former Cigarette smoker, quit 35 yrs ago  ETOH use,  advised to drink no more than 2 drink(s) a day  Obesity, Body mass index is 32.14 kg/m., recommend weight loss, diet and exercise as appropriate   Hx stroke/TIA  03/2011 - small R parasagittal frontal white matter infarct / corpus callosum   Obstructive sleep apnea, on CPAP at home  Other Active Problems  Essential tremors s/p DBS 05/07/2019 - Followed by Dr. Carles Collet  CKD  Hospital day # 3  Neurology will sign off. Please call with questions. Pt will follow up with Dr. Carles Collet at [facility] in about 4 weeks. Thanks for the consult.   [name], MD PhD Stroke Neurology 08/31/2019 2:40 PM    To contact Stroke Continuity provider, please refer to [email or web address] After hours, contact General Neurology

## 2019-08-31 NOTE — Discharge Instructions (Signed)
Information on my medicine - ELIQUIS (apixaban)  This medication education was reviewed with me or my healthcare representative as part of my discharge preparation.  The pharmacist that spoke with me during my hospital stay was:  Brain Hilts, [facility]  Why was Eliquis prescribed for you? Eliquis was prescribed for you to reduce the risk of forming blood clots that can cause a stroke if you have a medical condition called atrial fibrillation (a type of irregular heartbeat) OR to reduce the risk of a blood clots forming after orthopedic surgery.  What do You need to know about Eliquis ? Take your Eliquis TWICE DAILY - one tablet in the morning and one tablet in the evening with or without food.  It would be best to take the doses about the same time each day.  If you have difficulty swallowing the tablet whole please discuss with your pharmacist how to take the medication safely.  Take Eliquis exactly as prescribed by your doctor and DO NOT stop taking Eliquis without talking to the doctor who prescribed the medication.  Stopping may increase your risk of developing a new clot or stroke.  Refill your prescription before you run out.  After discharge, you should have regular check-up appointments with your healthcare provider that is prescribing your Eliquis.  In the future your dose may need to be changed if your kidney function or weight changes by a significant amount or as you get older.  What do you do if you miss a dose? If you miss a dose, take it as soon as you remember on the same day and resume taking twice daily.  Do not take more than one dose of ELIQUIS at the same time.  Important Safety Information A possible side effect of Eliquis is bleeding. You should call your healthcare provider right away if you experience any of the following: ? Bleeding from an injury or your nose that does not stop. ? Unusual colored urine (red or dark brown) or unusual colored stools (red or  black). ? Unusual bruising for unknown reasons. ? A serious fall or if you hit your head (even if there is no bleeding).  Some medicines may interact with Eliquis and might increase your risk of bleeding or clotting while on Eliquis. To help avoid this, consult your healthcare provider or pharmacist prior to using any new prescription or non-prescription medications, including herbals, vitamins, non-steroidal anti-inflammatory drugs (NSAIDs) and supplements.  This website has more information on Eliquis (apixaban): [email or web address].

## 2019-08-31 NOTE — Discharge Summary (Signed)
Physician Discharge Summary  Joshua [name] [id number] DOB: [birth year] DOA: 08/28/2019  PCP: [name], MD  Admit date: 08/28/2019 Discharge date: 08/31/2019  Admitted From: Home  Disposition:  Home   Recommendations for Outpatient Follow-up:  Joshua. Follow up with Dr. Virgina Jock in Joshua week 2. Follow up with Neurology Dr. Carles Collet in 5-6 weeks 3. Obtain 30-day event monitor and follow up with Cardiology after 4. Dr. Virgina Jock: Please review blood sugars and adjust home insulin regimen as needed 5. Dr. Carles Collet: Please repeat CTA neck to re-evaluate left subclavian artery thrombus, see below 6. Dr. Carles Collet: Please continue Eliquis for 2 months, then transition to antiplatelet if subclavian thrombus resolved 7. Dr. Carles Collet: Please review follow up from 30 day even monitor and Cardiology referral     Home Health: None  Equipment/Devices: None  Discharge Condition: Good  CODE STATUS: FULL Diet recommendation: Cardiac, diabetic  Brief/Interim Summary: Joshua [name] is a 68 y.o. M with HTN, DM, OSA on CPAP, provoked VTE 2018, essential tremor s/p DBS June 2020, and prior CVA who presented with slurred speech.          PRINCIPAL HOSPITAL DIAGNOSIS:  Acute suspected embolic stroke    Discharge Diagnoses:   Acute RIGHT internal capsule stroke Suspected MRI negative acute LEFT cerebellar stroke  Outside tPA window at presentation.  CTA head/neck at admission showed subclavian artery thrombus, thought initially to be part of an embolic cascade that had also caused stroke.    Subsequent MRI showed a RIGHT internal capsule infarct, but it was suspected that his sypmtoms clinically were most consistent with multifocal stroke, including in the cerebellum and that the initial embolic impression was correct.    -Echocardiogram showed no cardiogenic source of embolism -Non-invasive angiography showed <50% carotids, mild atherosclerosis overall (in addition to subclavian thrombus) -Heparin started at admission  rather than aspirin -- > transitioned to Eliquis -Lipids ordered: increased atorvastatin dose to 80 mg nightly -Atrial fibrillation: not present --> will need 30 day event monitor at d/c -tPA not given because outside window -Dysphagia screen ordered in ER -PT eval ordered: recommended no PT follow up -Non-smoker    LEFT subclavian artery thrombosis Unclear if this was from a cardiac source or developed in situ.  Given his previous retinal artery occlusion, it seems suggestive of recurrent embolism.  Treated with heparin 72 hours, improved.  Transitioned to Eliquis. -Continue Eliquis for 2 months, then repeat CTA neck.   -If negative, and no atrial fibrillation noted on 30 day monitor, Stroke team recommends stopping Eliquis and resuming anti-platelet agent.    Dysphagia Will obtain outpatient speech therapy  -DYS 3, thins  Diabetes Required substantially less insulin during hospital stay than typical at home.  Recommended slow increase in insulin regimen at home, close follow up with PCP.  Hypertension  CKD III Baseline Cr Joshua.3  OSA Continue CPAP  Cardiac hypertrophy Incidental finding.  Asymmetric basal septal hypertrophy noted on echo.  No previous cardiac arrest, no previously noted ventricular tachycardia.  This was noted on previous echo.  Recommend follow up with Cardiology for review of this as well as 30 day event monitor.              Discharge Instructions  Discharge Instructions    Ambulatory referral to Neurology   Complete by: As directed    An appointment is requested in approximately: 5-6 weeks   Ambulatory referral to Speech Therapy   Complete by: As directed    Discharge instructions  Complete by: As directed    From Gerald Stabs: You were admitted with a stroke. Thankfully, this stroke only affected a small area of the brain, and you have already shown signs of improvement. We also found that there was some clot in your "left subclavian artery"  and there is a thought that this was evidence of a cascade of debris or "emboli" from the heart. To resolve the clot in the subclavian AND to prevent any other clots that we couldn't see from causing another stroke, you should take a blood thinner for 2 months. Take Eliquis 5 mg twice daily starting tonight  Have Dr. Carles Collet order a repeat CAT scan with angiography of the neck in 2 months to confirm that the subclavian artery clot is resolved. No particular follow up with Vascular surgery is necessary unless Dr. Carles Collet has questions  In addition, Dr. Erlinda Hong felt it would be optimal to INCREASE your cholesterol medicine dosage.  Change your Lipitor/atorvastatin from 40 mg to 80 mg at night  Resume your blood pressure medicines    Call Dr. Keane Police office for a follow up appointment in about Joshua week.  See how you feel about Return to Work at that point.  The Cardiology office will contact you within a week about the heart monitor; if you haven't heard from them in Joshua week, ask Dr. Virgina Jock to follow up.   For your blood sugars: You needed only a small amount of insulin here. So: for now, reduce your insulin and check your sugar three times per day (when you wake up, at lunch and at dinner)  Take Lantus 10 units twice daily at first If your morning sugar is OVER 180, you can increase the Lantus by 5 units Likely you will eventually get back to your normal doses of 80-85 units twice daily If your sugar at any point is over 300, call Dr. Keane Police office.  For now, reduce the SHORT-ACTING insulin to 4 units with each meal. Discuss this with Dr. Virgina Jock as well The most accurate way to adjust the short-acting insulin is to check your blood sugar 2 HOURS AFTER A MEAL. If your blood sugar is OVER 200 2 hours after eating, you can safely increase the mealtime, short-acting insulin.   Increase activity slowly   Complete by: As directed      Allergies as of 08/31/2019   No Known Allergies     Medication List     STOP taking these medications   aspirin 81 MG tablet   HYDROcodone-acetaminophen 5-325 MG tablet Commonly known as: NORCO/VICODIN   mupirocin ointment 2 % Commonly known as: BACTROBAN   promethazine 25 MG tablet Commonly known as: PHENERGAN     TAKE these medications   amLODipine 5 MG tablet Commonly known as: NORVASC Take 5 mg by mouth at bedtime.   apixaban 5 MG Tabs tablet Commonly known as: ELIQUIS Take Joshua tablet (5 mg total) by mouth 2 (two) times daily.   atorvastatin 80 MG tablet Commonly known as: LIPITOR Take Joshua tablet (80 mg total) by mouth daily at 6 PM. What changed:   medication strength  how much to take  when to take this   cephALEXin 500 MG capsule Commonly known as: KEFLEX Take Joshua capsule (500 mg total) by mouth 3 (three) times daily.   DULoxetine 60 MG capsule Commonly known as: CYMBALTA Take 60 mg by mouth daily.   fenofibrate 54 MG tablet Take 54 mg by mouth daily.   Fish Oil 1000  MG Caps Take 2,000 mg by mouth daily. Reported on 02/13/2016   HumaLOG KwikPen 100 UNIT/ML KwikPen Generic drug: insulin lispro Inject 15-25 Units into the skin See admin instructions. Inject 15 units subcutaneously after breakfast and 25 units after lunch and supper   hydrochlorothiazide 25 MG tablet Commonly known as: HYDRODIURIL Take 25 mg by mouth daily.   losartan 100 MG tablet Commonly known as: COZAAR Take 100 mg by mouth daily.   Lutein-Zeaxanthin 25-5 MG Caps Take Joshua tablet by mouth daily.   metFORMIN 1000 MG tablet Commonly known as: GLUCOPHAGE Take Joshua,000 mg by mouth 2 (two) times daily with a meal.   multivitamin with minerals Tabs tablet Take Joshua tablet by mouth daily.   OneTouch Verio test strip Generic drug: glucose blood   PRESCRIPTION MEDICATION Inhale into the lungs at bedtime. CPAP   SYSTANE OP Place Joshua-2 drops into both eyes 4 (four) times daily as needed (dry eyes).   Toujeo SoloStar 300 UNIT/ML Sopn Generic drug: Insulin  Glargine (Joshua Unit Dial) Inject 85 Units into the skin 2 (two) times daily.      Follow-up Information    [facility] Follow up.   Specialty: Rehabilitation Why: The outpatient rehab will contact you for the first appointment Contact information: [address] [place]       [name], MD Follow up.   Specialty: Internal Medicine Why: Call for an appointment early next week Contact information: [address] [place] [address] [phone]        TatEustace Quail, DO Follow up.   Specialty: Neurology Why: Call Dr. Doristine Devoid office for an appointment in 4-6 weeks, if you haven't heard from them by the weekend Contact information: [place] [address] [phone]          No Known Allergies  Consultations:  Neurology   Procedures/Studies: Ct Code Stroke Cta Head W/[name] Contrast  Addendum Date: 08/28/2019   ADDENDUM REPORT: 08/28/2019 15:19 ADDENDUM: Study reviewed by telephone with Dr. Roland Rack on 08/28/2019 at 15:15. He states that clinically he is suspicious for a small cerebellar infarct, which makes the suspected small thrombus in the proximal left subclavian artery more likely to be the dominant finding. We discussed that the left MCA middle division M3 branch appearance might be related to streak artifact from the DBS. Electronically Signed   By: Genevie Ann M.D.   On: 08/28/2019 15:19   Result Date: 08/28/2019 CLINICAL DATA:  68 year old [name] with slurred speech. History of deep brain stimulator. EXAM: CT ANGIOGRAPHY HEAD AND NECK CT PERFUSION BRAIN TECHNIQUE: Multidetector CT imaging of the head and neck was performed using the standard protocol during bolus administration of intravenous contrast. Multiplanar CT image reconstructions and MIPs were obtained to evaluate the vascular anatomy. Carotid stenosis measurements  (when applicable) are obtained utilizing NASCET criteria, using the distal internal carotid diameter as the denominator. Multiphase CT imaging of the brain was performed following IV bolus contrast injection. Subsequent parametric perfusion maps were calculated using RAPID software. CONTRAST:  114mL OMNIPAQUE IOHEXOL 350 MG/ML SOLN COMPARISON:  Head CT 07/07/2019 and earlier. CTA head and neck 04/09/2011. FINDINGS: CT HEAD FINDINGS Brain: Resolved postoperative pneumocephalus since last month. The left deep brain stimulator device terminates at the left thalamus as before. No acute intracranial hemorrhage identified. No midline shift, mass effect, or evidence of intracranial mass lesion. No ventriculomegaly. Evidence of a chronic right thalamic lacunar  infarct is unchanged. Patchy white matter hypodensity in both hemispheres appears stable. No cortically based acute infarct identified. Vascular: Calcified atherosclerosis at the skull base. Skull: Vertex burr holes.  No acute osseous abnormality identified. Sinuses/Orbits: Visualized paranasal sinuses and mastoids are stable and well pneumatized. Other: Left vertex approach electrical lead with removed skin staples and resolved scalp gas since the postoperative CT last month. Electrode has now been tunneled across the vertex into the right scalp and neck. Stable orbits soft tissues, with a small preseptal infraorbital calcification or retained foreign body as before. This has been present since 2012, with 2 successful brain MRI since that time. ASPECTS [facility] Stroke Program Early CT Score) Total score (0-10 with 10 being normal): 10 Review of the MIP images confirms the above findings CT Brain Perfusion Findings: CBF (<30%) Volume: 0 mL Perfusion (Tmax>6.0s) volume: 0 mL Mismatch Volume: Not applicable Infarction Location:Not applicable CTA NECK Skeleton: No acute osseous abnormality identified. Multilevel cervical spine ankylosis in part related to flowing  anterior osteophytes. Upper chest: Right chest generator device now in place. Otherwise negative. Other neck: Right neck stimulator lead tracking to the vertex, no adverse features. Otherwise stable since 2012. Aortic arch: 3 vessel arch configuration. Mild arch calcified plaque. Mild arch ectasia. Right carotid system: Tortuous brachiocephalic artery and proximal right CCA without stenosis. Calcified plaque at the right ICA origin and bulb has not significantly changed since 2012, less than 50 % stenosis with respect to the distal vessel. Highly tortuous cervical right ICA without stenosis to the skull base. Left carotid system: No left CCA origin plaque or stenosis. Calcified plaque at the left ICA origin and bulb has mildly progressed since 2012. Less than 50 % stenosis with respect to the distal vessel. Highly tortuous cervical left ICA which is mildly dolichoectatic to the skull base. Vertebral arteries: Mild plaque in the proximal right subclavian artery without stenosis. Normal right vertebral artery origin. Patent right vertebral artery to the skull base without stenosis. Right V2 tortuosity. 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 There is nearby soft and calcified plaque which appears more stable. There is no proximal left subclavian artery stenosis. The left vertebral artery origin remains patent with mild to moderate stenosis (series 13, image 170). The left vertebral artery is tortuous, mildly dominant, and patent to the skull base without additional stenosis. Tortuous left V1 segment CTA HEAD Posterior circulation: No distal vertebral artery stenosis, the left is mildly dominant. Normal right PICA origin. Patent vertebrobasilar junction without stenosis. Patent basilar artery without stenosis. The left AICA may be dominant, unchanged. Patent SCA and PCA origins. Mild right P1 irregularity and stenosis is new on series  16, image 27. Bilateral PCA branches are otherwise stable and without significant stenosis. Anterior circulation: Both ICA siphons are patent. On the left there is mild calcified plaque without stenosis. Normal right ophthalmic artery origin. On the right there is mild to moderate calcified plaque without stenosis. Normal right ophthalmic artery origin. Posterior communicating arteries are diminutive or absent. Dominant left ACA A1 segment. MCA and left ACA origins are mildly ectatic. Diminutive right A1. Anterior communicating artery is mildly ectatic. Bilateral ACA branches are stable and within normal limits. Left MCA M1 and bifurcation are patent without stenosis. Suggestion of moderate new left MCA middle in 3 branch irregularity and stenosis since 2012. Right MCA M1 segment, bifurcation, and right MCA branches appear patent and stable since  2012. Venous sinuses: Patent. Anatomic variants: Mildly dominant left vertebral artery. Dominant left and diminutive right ACA A1 segments. Review of the MIP images confirms the above findings IMPRESSION: Joshua. Negative for large vessel occlusion, and no core infarct or ischemic penumbra identified by CT Perfusion. 2. Positive for a small 5 mm focus of adherent thrombus in the proximal left subclavian artery on series 10, image 169. Nearby chronic left subclavian plaque. 3. No large vessel stenosis in the head or neck, but suggestion of Left MCA middle division M3 branch moderate irregularity and stenosis since 2012. Unclear if this might be related to a possible recent thromboembolic event implied by #2. 4. Satisfactory postoperative CT appearance of the brain following left deep brain stimulator device in August. 5. Bilateral carotid atherosclerosis with mild progression since 2012. No significant stenosis. Study discussed by telephone with Dr. Roland Rack on 08/28/2019 at 14:30 . Electronically Signed: By: Genevie Ann M.D. On: 08/28/2019 14:31   Ct Code Stroke Cta Neck  W/[name] Contrast  Addendum Date: 08/28/2019   ADDENDUM REPORT: 08/28/2019 15:19 ADDENDUM: Study reviewed by telephone with Dr. Roland Rack on 08/28/2019 at 15:15. He states that clinically he is suspicious for a small cerebellar infarct, which makes the suspected small thrombus in the proximal left subclavian artery more likely to be the dominant finding. We discussed that the left MCA middle division M3 branch appearance might be related to streak artifact from the DBS. Electronically Signed   By: Genevie Ann M.D.   On: 08/28/2019 15:19   Result Date: 08/28/2019 CLINICAL DATA:  68 year old [name] with slurred speech. History of deep brain stimulator. EXAM: CT ANGIOGRAPHY HEAD AND NECK CT PERFUSION BRAIN TECHNIQUE: Multidetector CT imaging of the head and neck was performed using the standard protocol during bolus administration of intravenous contrast. Multiplanar CT image reconstructions and MIPs were obtained to evaluate the vascular anatomy. Carotid stenosis measurements (when applicable) are obtained utilizing NASCET criteria, using the distal internal carotid diameter as the denominator. Multiphase CT imaging of the brain was performed following IV bolus contrast injection. Subsequent parametric perfusion maps were calculated using RAPID software. CONTRAST:  149mL OMNIPAQUE IOHEXOL 350 MG/ML SOLN COMPARISON:  Head CT 07/07/2019 and earlier. CTA head and neck 04/09/2011. FINDINGS: CT HEAD FINDINGS Brain: Resolved postoperative pneumocephalus since last month. The left deep brain stimulator device terminates at the left thalamus as before. No acute intracranial hemorrhage identified. No midline shift, mass effect, or evidence of intracranial mass lesion. No ventriculomegaly. Evidence of a chronic right thalamic lacunar infarct is unchanged. Patchy white matter hypodensity in both hemispheres appears stable. No cortically based acute infarct identified. Vascular: Calcified atherosclerosis at the skull base.  Skull: Vertex burr holes.  No acute osseous abnormality identified. Sinuses/Orbits: Visualized paranasal sinuses and mastoids are stable and well pneumatized. Other: Left vertex approach electrical lead with removed skin staples and resolved scalp gas since the postoperative CT last month. Electrode has now been tunneled across the vertex into the right scalp and neck. Stable orbits soft tissues, with a small preseptal infraorbital calcification or retained foreign body as before. This has been present since 2012, with 2 successful brain MRI since that time. ASPECTS [facility] Stroke Program Early CT Score) Total score (0-10 with 10 being normal): 10 Review of the MIP images confirms the above findings CT Brain Perfusion Findings: CBF (<30%) Volume: 0 mL Perfusion (Tmax>6.0s) volume: 0 mL Mismatch Volume: Not applicable Infarction Location:Not applicable CTA NECK Skeleton: No acute osseous abnormality identified. Multilevel cervical spine  ankylosis in part related to flowing anterior osteophytes. Upper chest: Right chest generator device now in place. Otherwise negative. Other neck: Right neck stimulator lead tracking to the vertex, no adverse features. Otherwise stable since 2012. Aortic arch: 3 vessel arch configuration. Mild arch calcified plaque. Mild arch ectasia. Right carotid system: Tortuous brachiocephalic artery and proximal right CCA without stenosis. Calcified plaque at the right ICA origin and bulb has not significantly changed since 2012, less than 50 % stenosis with respect to the distal vessel. Highly tortuous cervical right ICA without stenosis to the skull base. Left carotid system: No left CCA origin plaque or stenosis. Calcified plaque at the left ICA origin and bulb has mildly progressed since 2012. Less than 50 % stenosis with respect to the distal vessel. Highly tortuous cervical left ICA which is mildly dolichoectatic to the skull base. Vertebral arteries: Mild plaque in the proximal right  subclavian artery without stenosis. Normal right vertebral artery origin. Patent right vertebral artery to the skull base without stenosis. Right V2 tortuosity. 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 There is nearby soft and calcified plaque which appears more stable. There is no proximal left subclavian artery stenosis. The left vertebral artery origin remains patent with mild to moderate stenosis (series 13, image 170). The left vertebral artery is tortuous, mildly dominant, and patent to the skull base without additional stenosis. Tortuous left V1 segment CTA HEAD Posterior circulation: No distal vertebral artery stenosis, the left is mildly dominant. Normal right PICA origin. Patent vertebrobasilar junction without stenosis. Patent basilar artery without stenosis. The left AICA may be dominant, unchanged. Patent SCA and PCA origins. Mild right P1 irregularity and stenosis is new on series 16, image 27. Bilateral PCA branches are otherwise stable and without significant stenosis. Anterior circulation: Both ICA siphons are patent. On the left there is mild calcified plaque without stenosis. Normal right ophthalmic artery origin. On the right there is mild to moderate calcified plaque without stenosis. Normal right ophthalmic artery origin. Posterior communicating arteries are diminutive or absent. Dominant left ACA A1 segment. MCA and left ACA origins are mildly ectatic. Diminutive right A1. Anterior communicating artery is mildly ectatic. Bilateral ACA branches are stable and within normal limits. Left MCA M1 and bifurcation are patent without stenosis. Suggestion of moderate new left MCA middle in 3 branch irregularity and stenosis since 2012. Right MCA M1 segment, bifurcation, and right MCA branches appear patent and stable since 2012. Venous sinuses: Patent. Anatomic variants: Mildly dominant left vertebral artery. Dominant  left and diminutive right ACA A1 segments. Review of the MIP images confirms the above findings IMPRESSION: Joshua. Negative for large vessel occlusion, and no core infarct or ischemic penumbra identified by CT Perfusion. 2. Positive for a small 5 mm focus of adherent thrombus in the proximal left subclavian artery on series 10, image 169. Nearby chronic left subclavian plaque. 3. No large vessel stenosis in the head or neck, but suggestion of Left MCA middle division M3 branch moderate irregularity and stenosis since 2012. Unclear if this might be related to a possible recent thromboembolic event implied by #2. 4. Satisfactory postoperative CT appearance of the brain following left deep brain stimulator device in August. 5. Bilateral carotid atherosclerosis with mild progression since 2012. No significant stenosis. Study discussed by telephone with Dr. Roland Rack on 08/28/2019 at 14:30 . Electronically Signed: By: Genevie Ann M.D. On: 08/28/2019 14:31  Joshua [name] Contrast  Result Date: 08/30/2019 CLINICAL DATA:  Stroke. Slurred speech and weakness. Deep brain stimulator EXAM: MRI HEAD WITHOUT CONTRAST TECHNIQUE: Multiplanar, multiecho pulse sequences of the brain and surrounding structures were obtained without intravenous contrast. COMPARISON:  CT head 08/28/2019 FINDINGS: Brain: Unilateral deep brain stimulator on the left causing mild artifact. Acute infarct posterior limb internal capsule on the right measuring approximately 10 x 15 mm. No other acute infarct. Generalized atrophy with chronic microvascular ischemic changes in the white matter. Chronic infarct right thalamus. Negative for hemorrhage or mass. Vascular: Normal arterial flow voids Skull and upper cervical spine: Negative Sinuses/Orbits: Mild mucosal edema paranasal sinuses. Bilateral ocular surgery Other: None IMPRESSION: Acute infarct right internal capsule. Chronic microvascular ischemic changes in the white matter and right thalamus  Left-sided deep brain stimulator. Electronically Signed   By: Franchot Gallo M.D.   On: 08/30/2019 17:48   Ct Code Stroke Cta Cerebral Perfusion W/[name] Contrast  Addendum Date: 08/28/2019   ADDENDUM REPORT: 08/28/2019 15:19 ADDENDUM: Study reviewed by telephone with Dr. Roland Rack on 08/28/2019 at 15:15. He states that clinically he is suspicious for a small cerebellar infarct, which makes the suspected small thrombus in the proximal left subclavian artery more likely to be the dominant finding. We discussed that the left MCA middle division M3 branch appearance might be related to streak artifact from the DBS. Electronically Signed   By: Genevie Ann M.D.   On: 08/28/2019 15:19   Result Date: 08/28/2019 CLINICAL DATA:  68 year old [name] with slurred speech. History of deep brain stimulator. EXAM: CT ANGIOGRAPHY HEAD AND NECK CT PERFUSION BRAIN TECHNIQUE: Multidetector CT imaging of the head and neck was performed using the standard protocol during bolus administration of intravenous contrast. Multiplanar CT image reconstructions and MIPs were obtained to evaluate the vascular anatomy. Carotid stenosis measurements (when applicable) are obtained utilizing NASCET criteria, using the distal internal carotid diameter as the denominator. Multiphase CT imaging of the brain was performed following IV bolus contrast injection. Subsequent parametric perfusion maps were calculated using RAPID software. CONTRAST:  153mL OMNIPAQUE IOHEXOL 350 MG/ML SOLN COMPARISON:  Head CT 07/07/2019 and earlier. CTA head and neck 04/09/2011. FINDINGS: CT HEAD FINDINGS Brain: Resolved postoperative pneumocephalus since last month. The left deep brain stimulator device terminates at the left thalamus as before. No acute intracranial hemorrhage identified. No midline shift, mass effect, or evidence of intracranial mass lesion. No ventriculomegaly. Evidence of a chronic right thalamic lacunar infarct is unchanged. Patchy white matter  hypodensity in both hemispheres appears stable. No cortically based acute infarct identified. Vascular: Calcified atherosclerosis at the skull base. Skull: Vertex burr holes.  No acute osseous abnormality identified. Sinuses/Orbits: Visualized paranasal sinuses and mastoids are stable and well pneumatized. Other: Left vertex approach electrical lead with removed skin staples and resolved scalp gas since the postoperative CT last month. Electrode has now been tunneled across the vertex into the right scalp and neck. Stable orbits soft tissues, with a small preseptal infraorbital calcification or retained foreign body as before. This has been present since 2012, with 2 successful brain MRI since that time. ASPECTS [facility] Stroke Program Early CT Score) Total score (0-10 with 10 being normal): 10 Review of the MIP images confirms the above findings CT Brain Perfusion Findings: CBF (<30%) Volume: 0 mL Perfusion (Tmax>6.0s) volume: 0 mL Mismatch Volume: Not applicable Infarction Location:Not applicable CTA NECK Skeleton: No acute osseous abnormality identified. Multilevel cervical spine ankylosis in part related to flowing anterior osteophytes. Upper chest:  Right chest generator device now in place. Otherwise negative. Other neck: Right neck stimulator lead tracking to the vertex, no adverse features. Otherwise stable since 2012. Aortic arch: 3 vessel arch configuration. Mild arch calcified plaque. Mild arch ectasia. Right carotid system: Tortuous brachiocephalic artery and proximal right CCA without stenosis. Calcified plaque at the right ICA origin and bulb has not significantly changed since 2012, less than 50 % stenosis with respect to the distal vessel. Highly tortuous cervical right ICA without stenosis to the skull base. Left carotid system: No left CCA origin plaque or stenosis. Calcified plaque at the left ICA origin and bulb has mildly progressed since 2012. Less than 50 % stenosis with respect to the distal  vessel. Highly tortuous cervical left ICA which is mildly dolichoectatic to the skull base. Vertebral arteries: Mild plaque in the proximal right subclavian artery without stenosis. Normal right vertebral artery origin. Patent right vertebral artery to the skull base without stenosis. Right V2 tortuosity. 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 There is nearby soft and calcified plaque which appears more stable. There is no proximal left subclavian artery stenosis. The left vertebral artery origin remains patent with mild to moderate stenosis (series 13, image 170). The left vertebral artery is tortuous, mildly dominant, and patent to the skull base without additional stenosis. Tortuous left V1 segment CTA HEAD Posterior circulation: No distal vertebral artery stenosis, the left is mildly dominant. Normal right PICA origin. Patent vertebrobasilar junction without stenosis. Patent basilar artery without stenosis. The left AICA may be dominant, unchanged. Patent SCA and PCA origins. Mild right P1 irregularity and stenosis is new on series 16, image 27. Bilateral PCA branches are otherwise stable and without significant stenosis. Anterior circulation: Both ICA siphons are patent. On the left there is mild calcified plaque without stenosis. Normal right ophthalmic artery origin. On the right there is mild to moderate calcified plaque without stenosis. Normal right ophthalmic artery origin. Posterior communicating arteries are diminutive or absent. Dominant left ACA A1 segment. MCA and left ACA origins are mildly ectatic. Diminutive right A1. Anterior communicating artery is mildly ectatic. Bilateral ACA branches are stable and within normal limits. Left MCA M1 and bifurcation are patent without stenosis. Suggestion of moderate new left MCA middle in 3 branch irregularity and stenosis since 2012. Right MCA M1 segment, bifurcation, and  right MCA branches appear patent and stable since 2012. Venous sinuses: Patent. Anatomic variants: Mildly dominant left vertebral artery. Dominant left and diminutive right ACA A1 segments. Review of the MIP images confirms the above findings IMPRESSION: Joshua. Negative for large vessel occlusion, and no core infarct or ischemic penumbra identified by CT Perfusion. 2. Positive for a small 5 mm focus of adherent thrombus in the proximal left subclavian artery on series 10, image 169. Nearby chronic left subclavian plaque. 3. No large vessel stenosis in the head or neck, but suggestion of Left MCA middle division M3 branch moderate irregularity and stenosis since 2012. Unclear if this might be related to a possible recent thromboembolic event implied by #2. 4. Satisfactory postoperative CT appearance of the brain following left deep brain stimulator device in August. 5. Bilateral carotid atherosclerosis with mild progression since 2012. No significant stenosis. Study discussed by telephone with Dr. Roland Rack on 08/28/2019 at 14:30 . Electronically Signed: By: Genevie Ann M.D. On: 08/28/2019 14:31       Subjective: Feels well.  Dysarthria improving.  No melena, no hematochezia.  Discharge Exam: Vitals:   08/31/19 0824 08/31/19 1207  BP: (!) 156/92 (!) 167/93  Pulse: 87 82  Resp: 20 20  Temp: (!) 97.4 F (36.3 C) 98.6 F (37 C)  SpO2: 98% 99%   Vitals:   08/31/19 0044 08/31/19 0349 08/31/19 0824 08/31/19 1207  BP: (!) 193/93 (!) 170/98 (!) 156/92 (!) 167/93  Pulse: 82 89 87 82  Resp: 18 17 20 20   Temp: 98.4 F (36.9 C) 97.7 F (36.5 C) (!) 97.4 F (36.3 C) 98.6 F (37 C)  TempSrc: Oral Oral Oral Oral  SpO2: 97% 97% 98% 99%  Weight:      Height:        General: Pt is alert, awake, not in acute distress Cardiovascular: RRR, nl S1-S2, no murmurs appreciated.   No LE edema.   Respiratory: Normal respiratory rate and rhythm.  CTAB without rales or wheezes. Abdominal: Abdomen soft and  non-tender.  No distension or HSM.   Neuro/Psych: Strength symmetric in upper and lower extremities.  Judgment and insight appear normal.   The results of significant diagnostics from this hospitalization (including imaging, microbiology, ancillary and laboratory) are listed below for reference.     Microbiology: Recent Results (from the past 240 hour(s))  SARS CORONAVIRUS 2 (TAT 6-24 HRS) Nasopharyngeal Urine, Clean Catch     Status: None   Collection Time: 08/28/19  2:13 PM   Specimen: Urine, Clean Catch; Nasopharyngeal  Result Value Ref Range Status   SARS Coronavirus 2 NEGATIVE NEGATIVE Final    Comment: (NOTE) SARS-CoV-2 target nucleic acids are NOT DETECTED. The SARS-CoV-2 RNA is generally detectable in upper and lower respiratory specimens during the acute phase of infection. Negative results do not preclude SARS-CoV-2 infection, do not rule out co-infections with other pathogens, and should not be used as the sole basis for treatment or other patient management decisions. Negative results must be combined with clinical observations, patient history, and epidemiological information. The expected result is Negative. Fact Sheet for Patients: SugarRoll.be Fact Sheet for Healthcare Providers: https://www.woods-mathews.com/ This test is not yet approved or cleared by the [place] FDA and  has been authorized for detection and/or diagnosis of SARS-CoV-2 by FDA under an Emergency Use Authorization (EUA). This EUA will remain  in effect (meaning this test can be used) for the duration of the COVID-19 declaration under Section 56 4(b)(Joshua) of the Act, 21 U.S.C. section 360bbb-3(b)(Joshua), unless the authorization is terminated or revoked sooner. Performed at [facility], [place] [address], [place], [place] [address]      Labs: BNP (last 3 results) No results for input(s): BNP in the last 8760 hours. Basic Metabolic Panel: Recent  Labs  Lab 08/28/19 1300 08/28/19 1316 08/31/19 0336  NA 140 141 140  K 4.2 4.2 3.9  CL 105 105 107  CO2 27  --  24  GLUCOSE 196* 198* 199*  BUN 25* 31* 19  CREATININE Joshua.51* Joshua.40* Joshua.33*  CALCIUM 9.3  --  9.5   Liver Function Tests: Recent Labs  Lab 08/28/19 1300  AST 22  ALT 16  ALKPHOS 81  BILITOT 0.5  PROT 6.7  ALBUMIN 3.3*   No results for input(s): LIPASE, AMYLASE in the last 168 hours. No results for input(s): AMMONIA in the last 168 hours. CBC: Recent Labs  Lab 08/28/19 1300 08/28/19 1316 08/29/19 0409 08/30/19 0651 08/31/19 0336  WBC 8.Joshua  --  8.Joshua 7.7 8.0  NEUTROABS 4.Joshua  --   --   --   --  HGB 12.9* 13.3 13.2 11.9* 12.9*  HCT 38.8* 39.0 39.5 35.8* 38.Joshua*  MCV 89.6  --  91.2 89.5 89.0  PLT 281  --  222 264 270   Cardiac Enzymes: No results for input(s): CKTOTAL, CKMB, CKMBINDEX, TROPONINI in the last 168 hours. BNP: Invalid input(s): POCBNP CBG: Recent Labs  Lab 08/30/19 1529 08/30/19 2113 08/31/19 0623 08/31/19 0822 08/31/19 1205  GLUCAP 220* 192* 192* 206* 200*   D-Dimer No results for input(s): DDIMER in the last 72 hours. Hgb A1c Recent Labs    08/29/19 0409  HGBA1C 7.8*   Lipid Profile Recent Labs    08/29/19 0409  CHOL 183  HDL 26*  LDLCALC 107*  TRIG 249*  CHOLHDL 7.0   Thyroid function studies No results for input(s): TSH, T4TOTAL, T3FREE, THYROIDAB in the last 72 hours.  Invalid input(s): FREET3 Anemia work up No results for input(s): VITAMINB12, FOLATE, FERRITIN, TIBC, IRON, RETICCTPCT in the last 72 hours. Urinalysis    Component Value Date/Time   COLORURINE YELLOW 08/28/2019 1413   APPEARANCEUR CLEAR 08/28/2019 1413   LABSPEC Joshua.024 08/28/2019 1413   PHURINE 7.0 08/28/2019 1413   GLUCOSEU 150 (A) 08/28/2019 1413   HGBUR NEGATIVE 08/28/2019 1413   BILIRUBINUR NEGATIVE 08/28/2019 1413   KETONESUR NEGATIVE 08/28/2019 1413   PROTEINUR >=300 (A) 08/28/2019 1413   UROBILINOGEN 0.2 04/08/2011 0732   NITRITE NEGATIVE  08/28/2019 1413   LEUKOCYTESUR NEGATIVE 08/28/2019 1413   Sepsis Labs Invalid input(s): PROCALCITONIN,  WBC,  LACTICIDVEN Microbiology Recent Results (from the past 240 hour(s))  SARS CORONAVIRUS 2 (TAT 6-24 HRS) Nasopharyngeal Urine, Clean Catch     Status: None   Collection Time: 08/28/19  2:13 PM   Specimen: Urine, Clean Catch; Nasopharyngeal  Result Value Ref Range Status   SARS Coronavirus 2 NEGATIVE NEGATIVE Final    Comment: (NOTE) SARS-CoV-2 target nucleic acids are NOT DETECTED. The SARS-CoV-2 RNA is generally detectable in upper and lower respiratory specimens during the acute phase of infection. Negative results do not preclude SARS-CoV-2 infection, do not rule out co-infections with other pathogens, and should not be used as the sole basis for treatment or other patient management decisions. Negative results must be combined with clinical observations, patient history, and epidemiological information. The expected result is Negative. Fact Sheet for Patients: SugarRoll.be Fact Sheet for Healthcare Providers: https://www.woods-mathews.com/ This test is not yet approved or cleared by the [place] FDA and  has been authorized for detection and/or diagnosis of SARS-CoV-2 by FDA under an Emergency Use Authorization (EUA). This EUA will remain  in effect (meaning this test can be used) for the duration of the COVID-19 declaration under Section 56 4(b)(Joshua) of the Act, 21 U.S.C. section 360bbb-3(b)(Joshua), unless the authorization is terminated or revoked sooner. Performed at [facility], [place] [address], [place], [place] [address]      Time coordinating discharge: 45 minutes      SIGNED:   Edwin Dada, MD  [facility] 08/31/2019, 6:43 PM

## 2019-08-31 NOTE — TOC Transition Note (Signed)
Transition of Care [facility]) - CM/SW Discharge Note   Patient Details  Name: RATANA FLOOD MRN: [id number] Date of Birth: 1951-07-09  [facility] CM/SW Contact:  Pollie Friar, RN Phone Number: 08/31/2019, 2:27 PM   Clinical Narrative:    Pt discharging home with outpatient ST therapy. Pt chose to attend [facility]. Orders in Epic and information on the AVS. Pt starting on Eliquis and co pay information provided. Pt feels he can afford this medication. CM provided 30 day free card.  Wife is to provide supervision at home and transportation to home.    Final next level of care: OP Rehab Barriers to Discharge: No Barriers Identified   Patient Goals and CMS Choice        Discharge Placement                       Discharge Plan and Services                                     Social Determinants of Health (SDOH) Interventions     Readmission Risk Interventions No flowsheet data found.

## 2019-08-31 NOTE — Progress Notes (Signed)
Discharged to home after IV access removed and discharge instructions reviewed with pt and wife.  Had many questions so all were addressed by Dr and this nurse.  No complaints.

## 2019-08-31 NOTE — Progress Notes (Signed)
Physical Therapy Treatment Patient Details Name: Joshua [name] MRN: [id number] DOB: May 09, 1951 Today's Date: 08/31/2019    History of Present Illness  Joshua [name] is a 68 y.o. male with medical history significant of hypertension, hyperlipidemia, diabetes mellitus, obstructive sleep apnea on CPAP, essential tremors status post deep brain stimulator placement on 05/07/2019, obesity, CVA presents to emergency department due to slurred speech, generalized weakness since last night and nausea since this morning. 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  Focus of session was progression to stair training. Pt performed all aspects of mobility during session with supervision.  He still presents with minor balance impairments but manages well.     Follow Up Recommendations  No PT follow up     Equipment Recommendations  None recommended by PT    Recommendations for Other Services       Precautions / Restrictions Precautions Precautions: Fall Required Braces or Orthoses: Other Brace Other Brace: CAM boot R Restrictions Weight Bearing Restrictions: No    Mobility  Bed Mobility Overal bed mobility: Independent                Transfers Overall transfer level: Needs assistance Equipment used: None Transfers: Sit to/from Stand Sit to Stand: Supervision         General transfer comment: Supervision for safety.  Ambulation/Gait Ambulation/Gait assistance: Min guard Gait Distance (Feet): 150 Feet Assistive device: None Gait Pattern/deviations: Step-through pattern;Wide base of support Gait velocity: decreased   General Gait Details: Cues for forward gaze and reciprocal armswing.  Pt with minor staggering but able to self correct.   Stairs Stairs: Yes Stairs assistance: Supervision Stair Management: Alternating pattern;Forwards Number of  Stairs: 4 General stair comments: Pt tolerated stair negotiation well.   Wheelchair Mobility    Modified Rankin (Stroke Patients Only) Modified Rankin (Stroke Patients Only) Pre-Morbid Rankin Score: No symptoms Modified Rankin: Slight disability     Balance Overall balance assessment: Needs assistance Sitting-balance support: No upper extremity supported;Feet supported Sitting balance-Leahy Scale: Good       Standing balance-Leahy Scale: Fair                              Cognition Arousal/Alertness: Awake/alert Behavior During Therapy: WFL for tasks assessed/performed Overall Cognitive Status: Within Functional Limits for tasks assessed                                        Exercises      General Comments        Pertinent Vitals/Pain Pain Assessment: No/denies pain    Home Living                      Prior Function            PT Goals (current goals can now be found in the care plan section) Acute Rehab 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 Potential to Achieve Goals: Good Progress towards PT goals: Progressing toward goals    Frequency    Min 4X/week      PT Plan Current plan remains appropriate    Co-evaluation              AM-PAC  PT "6 Clicks" Mobility   Outcome Measure  Help needed turning from your back to your side while in a flat bed without using bedrails?: None Help needed moving from lying on your back to sitting on the side of a flat bed without using bedrails?: None Help needed moving to and from a bed to a chair (including a wheelchair)?: A Little Help needed standing up from a chair using your arms (e.g., wheelchair or bedside chair)?: A Little Help needed to walk in hospital room?: A Little Help needed climbing 3-5 steps with a railing? : A Little 6 Click Score: 20    End of Session Equipment Utilized During Treatment: Gait belt Activity Tolerance: Patient tolerated  treatment well Patient left: in chair;with call bell/phone within reach Nurse Communication: Mobility status PT Visit Diagnosis: Unsteadiness on feet (R26.81)     Time: [id number] PT Time Calculation (min) (ACUTE ONLY): 9 min  Charges:  $Gait Training: 8-22 mins                     Governor Rooks, PTA Acute Rehabilitation Services Pager [phone] Office [phone]     Edwardo Wojnarowski Eli Hose 08/31/2019, 4:04 PM

## 2019-08-31 NOTE — TOC Benefit Eligibility Note (Signed)
Transition of Care [facility]) Benefit Eligibility Note    Patient Details  Name: Joshua [name] MRN: [id number] Date of Birth: 11-16-1951   Medication/Dose: Eliquis 5 mg BID  Covered?: Yes  Tier: 3 Drug  Prescription Coverage Preferred Pharmacy: Any retail Pharmacy  Spoke with Person/Company/Phone Number:: Vania Rea  Co-Pay: 64.00 for a 30 day supply 125.00 for a 90 day supply  Prior Approval: No  Deductible: Unmet       Orbie Pyo Phone Number: 08/31/2019, 10:38 AM

## 2019-08-31 NOTE — Progress Notes (Signed)
  Speech Language Pathology Treatment: Cognitive-Linquistic  Patient Details Name: Joshua [name] MRN: [id number] DOB: 1951-07-08 Today's Date: 08/31/2019 Time: [id number] SLP Time Calculation (min) (ACUTE ONLY): 10 min  Assessment / Plan / Recommendation Clinical Impression  Pt was encountered awake/alert with wife present at bedside, and he was agreeable to ST tx session.  Pt was seen for dysarthria treatment and brief check of diet tolerance.  Pt was observed with trials of thin liquid and regular solids and he did not exhibit clinical s/sx of aspiration or difficulty with any trials.  Pt additionally reported that mild dysphagia symptoms had resolved today.  Pt was educated regarding speech intelligibility strategies, in particular speaking loud and over-articulating his words.  Pt demonstrated understanding via utilizing strategies at the word and sentence level in structured speech tasks.  Pt's speech was 100% intelligible with use of strategies.  Pt required min cues to use strategies in conversational speech tasks.  Handout regarding all recommendations and practice sentences were provided for the patient's use in between sessions.  Pt met his dysphagia goal on this date; however recommend continuation of skilled ST targeting dysarthria.     HPI HPI: Joshua [name] is a 68 y.o. male with medical history significant of hypertension, hyperlipidemia, diabetes mellitus, obstructive sleep apnea on CPAP, essential tremors status post deep brain stimulator placement on 05/07/2019, obesity, CVA presents to emergency department due to slurred speech, generalized weakness since last night and nausea since this morning. 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  No prior history of dysphagia or cognitive-linguistic deficits.       SLP Plan  Continue with current plan of care       Recommendations  Diet recommendations: Regular;Thin liquid Liquids provided  via: Cup;Straw Medication Administration: Whole meds with liquid Supervision: Patient able to self feed Compensations: Slow rate;Small sips/bites Postural Changes and/or Swallow Maneuvers: Seated upright 90 degrees                Oral Care Recommendations: Oral care BID Follow up Recommendations: Outpatient SLP SLP Visit Diagnosis: Dysarthria and anarthria (R47.1) Plan: Continue with current plan of care       Bretta Bang, M.S., [place] Office: [phone]               [place] 08/31/2019, 4:08 PM

## 2019-08-31 NOTE — Progress Notes (Signed)
[facility]  Subjective  -no new neurologic symptoms overnight   Objective (!) 156/92 87 (!) 97.4 F (36.3 C) (Oral) 20 98%  Intake/Output Summary (Last 24 hours) at 08/31/2019 0946 Last data filed at 08/31/2019 0043 Gross per 24 hour  Intake -  Output 1475 ml  Net -1475 ml    Palpable pulse in the left radial artery  Laboratory Lab Results: Recent Labs    08/30/19 0651 08/31/19 0336  WBC 7.7 8.0  HGB 11.9* 12.9*  HCT 35.8* 38.1*  PLT 264 270   BMET Recent Labs    08/28/19 1300 08/28/19 1316 08/31/19 0336  NA 140 141 140  K 4.2 4.2 3.9  CL 105 105 107  CO2 27  --  24  GLUCOSE 196* 198* 199*  BUN 25* 31* 19  CREATININE 1.51* 1.40* 1.33*  CALCIUM 9.3  --  9.5    COAG Lab Results  Component Value Date   INR 1.1 08/28/2019   INR 1.16 04/09/2017   No results found for: PTT  Assessment/Planning:  68 year old male admitted with suspected cerebellar stroke with small focal thrombus in the left proximal subclavian artery - potential source for atheroembolism.  MRI yesterday showed acute infarct in the right internal capsule.  Discussed with patient this morning potentially could stop anticoagulation given his stroke on MRI was not in the distribution of where his left subclavian thrombus is located.  However, after talking with Dr. Erlinda Hong with neurology this morning he still feels the patient's symptoms are consistent with a cerebellar stroke even though the MRI did not reveal this.  That being said, we have agreed that probably in patients best interest to continue anticoagulation for at least 3 months.  He does have a repeat CTA neck ordered for today just to see how the thrombus looks after anticoagulation for the last several days.  Marty Heck 08/31/2019 9:46 AM --

## 2019-09-01 ENCOUNTER — Telehealth: Payer: Self-pay

## 2019-09-01 ENCOUNTER — Encounter: Payer: Medicare Other | Admitting: Podiatry

## 2019-09-01 DIAGNOSIS — R4701 Aphasia: Secondary | ICD-10-CM

## 2019-09-01 DIAGNOSIS — I517 Cardiomegaly: Secondary | ICD-10-CM | POA: Insufficient documentation

## 2019-09-01 DIAGNOSIS — R1310 Dysphagia, unspecified: Secondary | ICD-10-CM | POA: Insufficient documentation

## 2019-09-01 HISTORY — DX: Aphasia: R47.01

## 2019-09-01 NOTE — Telephone Encounter (Signed)
Transition Care Management Follow-up Telephone Call    Date discharged? 08/31/19  How have you been since you were released from the hospital? Tried,sleepy  Any patient concerns? No   Items Reviewed:  Medications reviewed: yes  Allergies reviewed: yes  Dietary changes reviewed: yes  Referrals reviewed: yes   Functional Questionnaire:  Independent - I Dependent - D    Activities of Daily Living (ADLs):    Personal hygiene - I Dressing - I Eating - I Maintaining continence - I Transferring - I  Independent Activities of Daily Living (iADLs): Basic communication skills - talking a lot slower and speech is some slurred  Transportation - pt haven't driven since released from hospital. Spouse Meal preparation  - Patient preparing meals Shopping - spouse is shopping Housework - spouse  Managing medications - Patient Managing personal finances - Spouse   Confirmed importance and date/time of follow-up visits scheduled yes  Provider Appointment booked with Dr. Carles Collet  Confirmed with patient if condition begins to worsen call PCP or go to the ER.  Patient was given the office number and encouraged to call back with question or concerns: Yes

## 2019-09-01 NOTE — Telephone Encounter (Signed)
Called patient to review TCM questions prior to appt on Friday no answer left message to call office back to complete.

## 2019-09-01 NOTE — Progress Notes (Signed)
Transition Care Management Note  Joshua [name] was seen today in follow up for hospital f/u.  Many records are reviewed.  I have also talked with Dr Erlinda Hong, who took care of the patient in the hospital.patient presented to the hospital on October 4 with trouble speaking.  The hospital initially felt that he could not have an MRI because of his DBS.  I was contacted.  I felt confident that he could have an MRI, and was able to get the hospital protocol for his stimulator.  He ultimately did have an MRI of the brain, which I had the opportunity to personally review.  MRI demonstrated an acute posterior limb internal capsule infarct on the right.  However, stroke neurology felt that symptoms really were more consistent with posterior circulation stroke.  He did have a thrombus in the left subclavian artery.  He was placed on Eliquis because of this.  Pt states he still notes some trouble with liquids coming out the mouth and some trouble with swallowing liquids.  He still has some trouble with speech but it is a "lot better."  He is supposed to getting ST with the neurorehab center at [address].  Wife whispers to me when the patient is in the hall that she thinks he is anxious/depressed since his event.  ALLERGIES:  No Known Allergies  CURRENT MEDICATIONS:  Outpatient Encounter Medications as of 09/02/2019  Medication Sig  . amLODipine (NORVASC) 5 MG tablet Take 5 mg by mouth at bedtime.   Marland Kitchen apixaban (ELIQUIS) 5 MG TABS tablet Take 1 tablet (5 mg total) by mouth 2 (two) times daily.  Marland Kitchen atorvastatin (LIPITOR) 80 MG tablet Take 1 tablet (80 mg total) by mouth daily at 6 PM.  . cephALEXin (KEFLEX) 500 MG capsule Take 1 capsule (500 mg total) by mouth 3 (three) times daily.  . DULoxetine (CYMBALTA) 60 MG capsule Take 60 mg by mouth daily.  . fenofibrate 54 MG tablet Take 54 mg by mouth daily.  . hydrochlorothiazide (HYDRODIURIL) 25 MG tablet Take 25 mg by mouth daily.  . Insulin Glargine, 1 Unit Dial,  (TOUJEO SOLOSTAR) 300 UNIT/ML SOPN Inject 85 Units into the skin 2 (two) times daily.  . insulin lispro (HUMALOG KWIKPEN) 100 UNIT/ML KwikPen Inject 15-25 Units into the skin See admin instructions. Inject 15 units subcutaneously after breakfast and 25 units after lunch and supper  . losartan (COZAAR) 100 MG tablet Take 100 mg by mouth daily.  . Lutein-Zeaxanthin 25-5 MG CAPS Take 1 tablet by mouth daily.  . metFORMIN (GLUCOPHAGE) 1000 MG tablet Take 1,000 mg by mouth 2 (two) times daily with a meal.  . Multiple Vitamin (MULTIVITAMIN WITH MINERALS) TABS tablet Take 1 tablet by mouth daily.  . Omega-3 Fatty Acids (FISH OIL) 1000 MG CAPS Take 2,000 mg by mouth daily. Reported on 02/13/2016  . ONETOUCH VERIO test strip   . Polyethyl Glycol-Propyl Glycol (SYSTANE OP) Place 1-2 drops into both eyes 4 (four) times daily as needed (dry eyes).   Marland Kitchen PRESCRIPTION MEDICATION Inhale into the lungs at bedtime. CPAP   No facility-administered encounter medications on file as of 09/02/2019.     PAST MEDICAL HISTORY:   Past Medical History:  Diagnosis Date  . Benign essential tremor   . Benign positional vertigo   . CVA (cerebral vascular accident) ([place])    x2 - L retina, 1 right parietal  . Degenerative arthritis   . Depression   . Diabetes mellitus   . Dyslipidemia   .  GERD (gastroesophageal reflux disease)    hiatal hernia  . Gout   . H/O: vasectomy   . Hearing aid worn    b/l  . Hx of appendectomy   . Hx of tonsillectomy   . Hypertension   . 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 Melanoma ([place])   . Obesity   . OSA on CPAP    setting = 5  . Tremor, essential 06/22/2017  . Wears glasses     PAST SURGICAL HISTORY:   Past Surgical History:  Procedure Laterality Date  . APPENDECTOMY    . arthroscopic knee surgery Bilateral   . CATARACT EXTRACTION Bilateral   . COLONOSCOPY    . MINOR PLACEMENT OF FIDUCIAL N/A 06/30/2019   Procedure: Fiducial placement;  Surgeon: Erline Levine, MD;   Location: [facility];  Service: Neurosurgery;  Laterality: N/A;  Fiducial placement  . NASAL SEPTUM SURGERY    . PULSE GENERATOR IMPLANT N/A 07/14/2019   Procedure: Left cranial Implanted Pulse Generator and lead extension placement to right chest ;  Surgeon: Erline Levine, MD;  Location: [facility];  Service: Neurosurgery;  Laterality: N/A;  . SUBTHALAMIC STIMULATOR INSERTION Left 07/07/2019   Procedure: LEFT DEEP BRAIN STIMULATOR PLACEMENT;  Surgeon: Erline Levine, MD;  Location: [facility];  Service: Neurosurgery;  Laterality: Left;  . TONSILLECTOMY    . TOTAL KNEE ARTHROPLASTY Left 03/30/2017   Procedure: LEFT TOTAL KNEE ARTHROPLASTY;  Surgeon: Paralee Cancel, MD;  Location: [facility];  Service: Orthopedics;  Laterality: Left;  . WISDOM TOOTH EXTRACTION      SOCIAL HISTORY:   Social History   Socioeconomic History  . Marital status: Married    Spouse name: [name]  . Number of children: 2  . Years of education: Bachelors   . Highest education level: Bachelor's degree (e.g., BA, AB, BS)  Occupational History  . Occupation: retired    Fish farm manager: [employer]    Comment: teaching/coaching  Social Needs  . Financial resource strain: Not on file  . Food insecurity    Worry: Not on file    Inability: Not on file  . Transportation needs    Medical: Not on file    Non-medical: Not on file  Tobacco Use  . Smoking status: Former Smoker    Packs/day: 1.00    Years: 10.00    Pack years: 10.00    Types: Cigarettes    Quit date: 11/25/1983    Years since quitting: 35.7  . Smokeless tobacco: Never Used  Substance and Sexual Activity  . Alcohol use: Yes    Alcohol/week: 2.0 standard drinks    Types: 2 Standard drinks or equivalent per week    Comment: occassionally  . Drug use: No  . Sexual activity: Not on file  Lifestyle  . Physical activity    Days per week: Not on file    Minutes per session: Not on file  . Stress: Not on file  Relationships  . Social Herbalist on phone: Not  on file    Gets together: Not on file    Attends religious service: Not on file    Active member of club or organization: Not on file    Attends meetings of clubs or organizations: Not on file    Relationship status: Not on file  . Intimate partner violence    Fear of current or ex partner: Not on file    Emotionally abused: Not on file  Physically abused: Not on file    Forced sexual activity: Not on file  Other Topics Concern  . Not on file  Social History Narrative   Patient lives at home with wife. [name](   Patient has 2 children that are in good health.    Patient works for [employer]. Retired .   Patient has a Bachelors degree in History.     FAMILY HISTORY:   Family Status  Relation Name Status  . Mother  Deceased at age 53       Cerebral aneurysm  . Father  Deceased at age 34       Alzheimer's disease  . Brother  Alive  . Mat Uncle  (Not Specified)  . MGM  (Not Specified)  . Son 2 Alive  . Neg Hx  (Not Specified)    ROS:  Review of Systems  Constitutional: Negative.   HENT: Negative.   Eyes: Negative.   Respiratory: Negative.   Cardiovascular: Negative.   Gastrointestinal: Negative.   Genitourinary: Negative.   Skin: Negative.   Psychiatric/Behavioral: The patient is nervous/anxious (per wife).     PHYSICAL EXAMINATION:    VITALS:   Vitals:   09/02/19 0950  BP: 122/76  Pulse: 98  SpO2: 96%  Weight: 259 lb 3.2 oz (117.6 kg)  Height: 6\' 4"  (1.93 m)    GEN:  The patient appears stated age and is in NAD. HEENT:  Normocephalic, atraumatic.  The mucous membranes are moist. The superficial temporal arteries are without ropiness or tenderness. CV:  RRR Lungs:  CTAB Neck/HEME:  There are no carotid bruits bilaterally.  Neurological examination:  Orientation: The patient is alert and oriented x3. Cranial nerves: There is mild L facial droop with decreased NL fold on the left.   The speech is fluent and clear but slightly halting in  quality but just occasionally. Soft palate rises symmetrically and there is no tongue deviation. Hearing is intact to conversational tone. Sensation: Sensation is intact to light touch throughout Motor: Strength is 5/5 in the UE/LE  Movement examination: Tone: There is normal tone in the UE/LE Abnormal movements: He does have postural tremor on the left. Coordination:  There is no decremation with RAM's. Gait and Station: The patient has no difficulty arising out of a deep-seated chair without the use of the hands. The patient's stride length is good.    ASSESSMENT/PLAN:  1.    Recent stroke  -MRI demonstrated acute right posterior limb of the internal capsule infarct.  However, stroke neurology felt that symptoms were more consistent with a posterior circulation stroke, related to thrombus in the left subclavian (although patient certainly does have mild left facial droop today).  CTA of the head and neck were unremarkable.  Spoke with Dr. Erlinda Hong about the case and he felt that it was best to leave him on Eliquis for 3 months and repeat the CTA of the head neck.  If subclavian thrombus is resolved then, we will put him back on an antiplatelet.  -Echocardiogram without source of thrombus.  Ejection fraction 55 to 60%.  -LDL 107.  Goal is less than 70.  Was on Lipitor 40.  Is now on Lipitor 80 mg.  Discussed proper diet as well.  -Diabetes is not well controlled.  His hemoglobin A1c is 7.8.  Discussed of having a goal of less than 7.  Again, discussed proper diet.  -Discussed stroke risk factors, including prior stroke, age, obesity, sleep apnea, diabetes, hyperlipidemia, hypertension.  Discussed modifiable risk factors.  -He has already been referred to Dr. Gardiner Rhyme for a  30-day cardiac event monitor.   Number given to patient to schedule appointment as cardiology apparently called and were unable to get a hold of patient.    If neg, can consider loop recorder per Dr. Erlinda Hong once off of anticoagulation   -Number given to neuro rehab for patient to schedule speech therapy.  Referral was made by the hospital.   2.  Essential Tremor, overall moderate             - Patient underwent left VIM DBS on July 07, 2019.  The intention was originally to do bilateral VIM DBS, but the patient had a vagal episode in the operating room with associated nausea and vomiting.  He lost CSF during the episode, and it was decided not to attempt the other side at that point in time.  Patient has opted to hold on having a lead placed in the right VIM.  He did still have tremor on the left, but it is fairly mild             -Patient had his IPG placed on July 14, 2019.               -Postop programming was done today.             -We reviewed with the patient how to use his patient controller.   3.  Peripheral neuropathy             -The patient has clinical examination evidence of a diffuse peripheral neuropathy, which certainly can affect gait and balance.  We discussed safety associated with peripheral neuropathy.  His is likely due to DM.  We discussed the importance of controlling the diabetes, especially if we are to consider deep brain stimulation surgery.  4.  GAD  -Patient been much more anxious and depressed since his stroke.  This is very reasonable.  Was agreeable to counseling.  Made referral to social worker for counseling.  5.  We will see the patient back in the next 3 months, sooner should new neurologic issues arise.    Cc:  [name], MD

## 2019-09-02 ENCOUNTER — Ambulatory Visit: Payer: Medicare Other | Admitting: Neurology

## 2019-09-02 ENCOUNTER — Encounter: Payer: Self-pay | Admitting: Neurology

## 2019-09-02 ENCOUNTER — Other Ambulatory Visit: Payer: Self-pay

## 2019-09-02 VITALS — BP 122/76 | HR 98 | Ht 76.0 in | Wt 259.2 lb

## 2019-09-02 DIAGNOSIS — F411 Generalized anxiety disorder: Secondary | ICD-10-CM

## 2019-09-02 DIAGNOSIS — E785 Hyperlipidemia, unspecified: Secondary | ICD-10-CM

## 2019-09-02 DIAGNOSIS — I63119 Cerebral infarction due to embolism of unspecified vertebral artery: Secondary | ICD-10-CM | POA: Diagnosis not present

## 2019-09-02 NOTE — Patient Instructions (Signed)
You have been referred to Neuro Rehab for speech therapy by the hospital. They will call you directly to schedule an appointment.  Please call [phone] if you do not hear from them.

## 2019-09-05 ENCOUNTER — Ambulatory Visit (INDEPENDENT_AMBULATORY_CARE_PROVIDER_SITE_OTHER): Payer: Medicare Other | Admitting: Clinical

## 2019-09-05 ENCOUNTER — Other Ambulatory Visit: Payer: Self-pay

## 2019-09-05 DIAGNOSIS — F411 Generalized anxiety disorder: Secondary | ICD-10-CM

## 2019-09-09 ENCOUNTER — Ambulatory Visit: Payer: Medicare Other | Admitting: Neurology

## 2019-09-12 ENCOUNTER — Ambulatory Visit (INDEPENDENT_AMBULATORY_CARE_PROVIDER_SITE_OTHER): Payer: Self-pay | Admitting: Podiatry

## 2019-09-12 ENCOUNTER — Encounter: Payer: Self-pay | Admitting: Podiatry

## 2019-09-12 ENCOUNTER — Ambulatory Visit (INDEPENDENT_AMBULATORY_CARE_PROVIDER_SITE_OTHER): Payer: Medicare Other

## 2019-09-12 ENCOUNTER — Other Ambulatory Visit: Payer: Self-pay

## 2019-09-12 VITALS — Temp 98.0°F

## 2019-09-12 DIAGNOSIS — L97519 Non-pressure chronic ulcer of other part of right foot with unspecified severity: Secondary | ICD-10-CM

## 2019-09-12 DIAGNOSIS — L97529 Non-pressure chronic ulcer of other part of left foot with unspecified severity: Secondary | ICD-10-CM

## 2019-09-12 DIAGNOSIS — Z09 Encounter for follow-up examination after completed treatment for conditions other than malignant neoplasm: Secondary | ICD-10-CM

## 2019-09-12 DIAGNOSIS — E11621 Type 2 diabetes mellitus with foot ulcer: Secondary | ICD-10-CM

## 2019-09-13 NOTE — Progress Notes (Signed)
Subjective: Joshua [name] is a 68 y.o. is seen today in office s/p right fifth metatarsal head resection preformed on 08/24/2019.  He states he is doing well and not in any pain.  Presents today wearing a regular shoe.  Denies any systemic complaints such as fevers, chills, nausea, vomiting. No calf pain, chest pain, shortness of breath.   Since his surgery he did have an acute right posterior stroke related to a thrombus in the left subclavian.  He is currently on Eliquis for this.  I did stop by and see him in the hospital for a "social visit" at that time and was doing well.   Objective: General: No acute distress, AAOx3  DP/PT pulses palpable 2/4, CRT < 3 sec to all digits.  RIGHT foot: Incision is well coapted without any evidence of dehiscence and sutures are intact. There is no surrounding erythema, ascending cellulitis, fluctuance, crepitus, malodor, drainage/purulence. There is minimal edema around the surgical site. There is no pain along the surgical site.  He states that it feels "great".  No other areas of tenderness to bilateral lower extremities.  No other open lesions or pre-ulcerative lesions.  No pain with calf compression, swelling, warmth, erythema.   Assessment and Plan:  Status post right foot surgery, doing well with no complications   -Treatment options discussed including all alternatives, risks, and complications -X-rays obtained reviewed.  Status post fifth metatarsal head excision without any complicating factors. -This without complications.  Incisions remain well coapted.  Antibiotic ointment and a dressing applied.  He can start to shower the incision wet mount who applied similar dressing.  He can continue his regular shoe as tolerated.  I did debride some of the hyperkeratotic tissue submetatarsal 5 today to reveal the wound is healed no signs of infection. -Eelvation -Pain medication as needed. -Monitor for any clinical signs or symptoms of infection and  DVT/PE and directed to call the office immediately should any occur or go to the ER. -Follow-up as scheduled or sooner if any problems arise. In the meantime, encouraged to call the office with any questions, concerns, change in symptoms.   Celesta Gentile, DPM

## 2019-09-16 ENCOUNTER — Telehealth: Payer: Self-pay

## 2019-09-16 NOTE — Telephone Encounter (Signed)
LM with monitor instructions. Ordered 30 day [employer] Event Monitor to be delivered to pt's home address.

## 2019-09-18 NOTE — Progress Notes (Signed)
Cardiology Office Note:    Date:  09/19/2019   ID:  Joshua [name], DOB February 25, 1951, MRN [id number]  PCP:  [name], MD  Cardiologist:  No primary care provider on file.  Electrophysiologist:  None   Referring MD: Edwin Dada,*   Chief Complaint  Patient presents with  . Cerebrovascular Accident    History of Present Illness:    Joshua [name] is a 68 y.o. male with a hx of hypertension, diabtes, OSA, PE after knee surgery, essential tremor status post DBS June 2020, CVA who was recently admitted to [facility] from 08/28/19 through 08/31/19 with an acute CVA.  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  MRI brain showed right internal capsule infarct.  Echocardiogram showed no cardiogenic source of embolism.  He was started on heparin given subclavian artery thrombosis, and this was transitioned to Eliquis.  It was unclear if the thrombosis was due to a cardiac source or developed in situ.  Stroke team recommended Eliquis for 2 months and repeating CTA neck; if CTA neck negative and no atrial fibrillation noted on 30-day monitor, stroke team recommended stopping Eliquis and resuming antiplatelet agent.  TTE was negative for an incidental finding of asymmetric basal septal hypertrophy.  He was referred to cardiology to follow this up as well as a 30-day event monitor.  He has not started heart monitor yet, reports that monitor is to arrive this week.  He does report that he had an episode of palpitations last week.  States that he was sitting in his chair drinking coffee and had palpitations that lasted a few seconds and resolved.  He denies any chest pain or dyspnea.  He denies any family history of HCM.   Past Medical History:  Diagnosis Date  . Benign essential tremor   . Benign positional vertigo   . CVA (cerebral vascular accident) ([place])    x2 - L retina, 1 right parietal  . Degenerative arthritis   . Depression   . Diabetes  mellitus   . Dyslipidemia   . GERD (gastroesophageal reflux disease)    hiatal hernia  . Gout   . H/O: vasectomy   . Hearing aid worn    b/l  . Hx of appendectomy   . Hx of tonsillectomy   . Hypertension   . 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 Melanoma ([place])   . Obesity   . OSA on CPAP    setting = 5  . Tremor, essential 06/22/2017  . Wears glasses     Past Surgical History:  Procedure Laterality Date  . APPENDECTOMY    . arthroscopic knee surgery Bilateral   . CATARACT EXTRACTION Bilateral   . COLONOSCOPY    . MINOR PLACEMENT OF FIDUCIAL N/A 06/30/2019   Procedure: Fiducial placement;  Surgeon: Erline Levine, MD;  Location: [facility];  Service: Neurosurgery;  Laterality: N/A;  Fiducial placement  . NASAL SEPTUM SURGERY    . PULSE GENERATOR IMPLANT N/A 07/14/2019   Procedure: Left cranial Implanted Pulse Generator and lead extension placement to right chest ;  Surgeon: Erline Levine, MD;  Location: [facility];  Service: Neurosurgery;  Laterality: N/A;  . SUBTHALAMIC STIMULATOR INSERTION Left 07/07/2019   Procedure: LEFT DEEP BRAIN STIMULATOR PLACEMENT;  Surgeon: Erline Levine, MD;  Location: [facility];  Service: Neurosurgery;  Laterality: Left;  . TONSILLECTOMY    . TOTAL KNEE ARTHROPLASTY Left 03/30/2017   Procedure: LEFT  TOTAL KNEE ARTHROPLASTY;  Surgeon: Paralee Cancel, MD;  Location: [facility];  Service: Orthopedics;  Laterality: Left;  . WISDOM TOOTH EXTRACTION      Current Medications: Current Meds  Medication Sig  . amLODipine (NORVASC) 5 MG tablet Take 5 mg by mouth at bedtime.   Marland Kitchen apixaban (ELIQUIS) 5 MG TABS tablet Take 1 tablet (5 mg total) by mouth 2 (two) times daily.  Marland Kitchen atorvastatin (LIPITOR) 80 MG tablet Take 1 tablet (80 mg total) by mouth daily at 6 PM.  . DULoxetine (CYMBALTA) 60 MG capsule Take 60 mg by mouth daily.  . fenofibrate 54 MG tablet Take 54 mg by mouth daily.  . hydrochlorothiazide (HYDRODIURIL) 25 MG tablet Take 25 mg by mouth daily.  . Insulin  Glargine, 1 Unit Dial, (TOUJEO SOLOSTAR) 300 UNIT/ML SOPN Inject 85 Units into the skin 2 (two) times daily.  . insulin lispro (HUMALOG KWIKPEN) 100 UNIT/ML KwikPen Inject 15-25 Units into the skin See admin instructions. Inject 15 units subcutaneously after breakfast and 25 units after lunch and supper  . losartan (COZAAR) 100 MG tablet Take 100 mg by mouth daily.  . Lutein-Zeaxanthin 25-5 MG CAPS Take 1 tablet by mouth daily.  . metFORMIN (GLUCOPHAGE) 1000 MG tablet Take 1,000 mg by mouth 2 (two) times daily with a meal.  . Multiple Vitamin (MULTIVITAMIN WITH MINERALS) TABS tablet Take 1 tablet by mouth daily.  . Omega-3 Fatty Acids (FISH OIL) 1000 MG CAPS Take 2,000 mg by mouth daily. Reported on 02/13/2016  . ONETOUCH VERIO test strip   . Polyethyl Glycol-Propyl Glycol (SYSTANE OP) Place 1-2 drops into both eyes 4 (four) times daily as needed (dry eyes).   Marland Kitchen PRESCRIPTION MEDICATION Inhale into the lungs at bedtime. CPAP  . [DISCONTINUED] cephALEXin (KEFLEX) 500 MG capsule Take 1 capsule (500 mg total) by mouth 3 (three) times daily.     Allergies:   Patient has no known allergies.   Social History   Socioeconomic History  . Marital status: Married    Spouse name: [name]  . Number of children: 2  . Years of education: Bachelors   . Highest education level: Bachelor's degree (e.g., BA, AB, BS)  Occupational History  . Occupation: retired    Fish farm manager: [employer]    Comment: teaching/coaching  Social Needs  . Financial resource strain: Not on file  . Food insecurity    Worry: Not on file    Inability: Not on file  . Transportation needs    Medical: Not on file    Non-medical: Not on file  Tobacco Use  . Smoking status: Former Smoker    Packs/day: 1.00    Years: 10.00    Pack years: 10.00    Types: Cigarettes    Quit date: 11/25/1983    Years since quitting: 35.8  . Smokeless tobacco: Never Used  Substance and Sexual Activity  . Alcohol use: Yes    Alcohol/week:  2.0 standard drinks    Types: 2 Standard drinks or equivalent per week    Comment: occassionally  . Drug use: No  . Sexual activity: Not on file  Lifestyle  . Physical activity    Days per week: Not on file    Minutes per session: Not on file  . Stress: Not on file  Relationships  . Social Herbalist on phone: Not on file    Gets together: Not on file    Attends religious service: Not on file  Active member of club or organization: Not on file    Attends meetings of clubs or organizations: Not on file    Relationship status: Not on file  Other Topics Concern  . Not on file  Social History Narrative   Patient lives at home with wife. [name](   Patient has 2 children that are in good health.    Patient works for [employer]. Retired .   Patient has a Bachelors degree in History.      Family History: The patient's family history includes Alzheimer's disease in his father; Cerebral aneurysm in his mother; Diabetes in his maternal grandmother; Healthy in his son; Tremor in his brother, maternal uncle, and mother. There is no history of Colon cancer.  ROS:   Please see the history of present illness.     All other systems reviewed and are negative.  EKGs/Labs/Other Studies Reviewed:    The following studies were reviewed today:   EKG:  EKG is  ordered today.  The ekg ordered today demonstrates normal sinus rhythm with rate 92, left axis deviation, inferior Q waves, poor R wave progression  TTE 08/29/19:  1. Technically difficult study. Left ventricular ejection fraction appears grossly normal, approximately 55-60%, though difficult visualization even with contrast  2. There is asymmetric basal septal hypertrophy measuring 18 mm in basal septum (12 mm posterior wall). Consider cardiac MRI to assess for hypertrophic cardiomyopathy if clinically indicated  3. Definity contrast agent was given IV to delineate the left ventricular endocardial borders.  4.  Global right ventricle has normal systolic function.The right ventricular size is normal. No increase in right ventricular wall thickness.  5. There is mild dilatation of the aortic root measuring 39 mm.  6. The inferior vena cava is dilated in size with <50% respiratory variability, suggesting right atrial pressure of 15 mmHg.  Recent Labs: 08/28/2019: ALT 16 08/31/2019: BUN 19; Creatinine, Ser 1.33; Hemoglobin 12.9; Platelets 270; Potassium 3.9; Sodium 140  Recent Lipid Panel    Component Value Date/Time   CHOL 183 08/29/2019 0409   TRIG 249 (H) 08/29/2019 0409   HDL 26 (L) 08/29/2019 0409   CHOLHDL 7.0 08/29/2019 0409   VLDL 50 (H) 08/29/2019 0409   LDLCALC 107 (H) 08/29/2019 0409    Physical Exam:    VS:  BP (!) 142/95   Pulse 98   Temp (!) 97.3 F (36.3 C)   Ht 6\' 4"  (1.93 m)   Wt 263 lb (119.3 kg)   SpO2 98%   BMI 32.01 kg/m     Wt Readings from Last 3 Encounters:  09/19/19 263 lb (119.3 kg)  09/02/19 259 lb 3.2 oz (117.6 kg)  08/28/19 264 lb (119.7 kg)     GEN:  Well nourished, well developed in no acute distress HEENT: Normal NECK: No JVD LYMPHATICS: No lymphadenopathy CARDIAC: RRR, no murmurs, rubs, gallops RESPIRATORY:  Clear to auscultation without rales, wheezing or rhonchi  ABDOMEN: Soft, non-tender, non-distended MUSCULOSKELETAL:  No edema; No deformity  SKIN: Warm and dry NEUROLOGIC:  Alert and oriented x 3 PSYCHIATRIC:  Normal affect   ASSESSMENT:    1. Asymmetric septal hypertrophy (HCC)   2. Cerebrovascular accident (CVA), unspecified mechanism ([place])   3. Essential hypertension   4. Hyperlipidemia, unspecified hyperlipidemia type    PLAN:    In order of problems listed above:  Asymmetric basal septal hypertrophy: Measures 18 mm (posterior wall 12 mm)on TTE.  Cardiac MRI to evaluate for hypertrophic cardiomyopathy  CVA: Subclavian artery  thrombosis diagnosed, unclear if cardioembolic source or developed in situ.  Currently on Eliquis.  Will  follow-up 30 day monitor  Hypertension: On amlodipine 5 mg daily, hydrochlorothiazide 25 mg daily, losartan 100 mg daily  Hyperlipidemia: On atorvastatin 80 mg daily.  LDL 107 on 08/29/2019  Type 2 diabetes: A1c 7.8%.  On insulin  RTC in 3 months   Medication Adjustments/Labs and Tests Ordered: Current medicines are reviewed at length with the patient today.  Concerns regarding medicines are outlined above.  Orders Placed This Encounter  Procedures  . MR Card Morphology Wo/W Cm  . EKG 12-Lead   No orders of the defined types were placed in this encounter.   Patient Instructions  Medication Instructions:  Continue same medications   Lab Work: None ordered  Testing/Procedures: Schedule Cardiac MRI  Follow-Up: At [facility], you and your health needs are our priority.  As part of our continuing mission to provide you with exceptional heart care, we have created designated Provider Care Teams.  These Care Teams include your primary Cardiologist (physician) and Advanced Practice Providers (APPs -  Physician Assistants and Nurse Practitioners) who all work together to provide you with the care you need, when you need it.  Your next appointment:  In office 3 months        Signed, Donato Heinz, MD  09/19/2019 4:03 PM    [facility]

## 2019-09-19 ENCOUNTER — Other Ambulatory Visit: Payer: Self-pay

## 2019-09-19 ENCOUNTER — Ambulatory Visit: Payer: Medicare Other | Admitting: Cardiology

## 2019-09-19 ENCOUNTER — Encounter: Payer: Self-pay | Admitting: Cardiology

## 2019-09-19 VITALS — BP 142/95 | HR 98 | Temp 97.3°F | Ht 76.0 in | Wt 263.0 lb

## 2019-09-19 DIAGNOSIS — E785 Hyperlipidemia, unspecified: Secondary | ICD-10-CM | POA: Diagnosis not present

## 2019-09-19 DIAGNOSIS — I10 Essential (primary) hypertension: Secondary | ICD-10-CM

## 2019-09-19 DIAGNOSIS — I422 Other hypertrophic cardiomyopathy: Secondary | ICD-10-CM | POA: Diagnosis not present

## 2019-09-19 DIAGNOSIS — I639 Cerebral infarction, unspecified: Secondary | ICD-10-CM | POA: Diagnosis not present

## 2019-09-19 NOTE — Patient Instructions (Signed)
Medication Instructions:  Continue same medications   Lab Work: None ordered  Testing/Procedures: Schedule Cardiac MRI  Follow-Up: At [facility], you and your health needs are our priority.  As part of our continuing mission to provide you with exceptional heart care, we have created designated Provider Care Teams.  These Care Teams include your primary Cardiologist (physician) and Advanced Practice Providers (APPs -  Physician Assistants and Nurse Practitioners) who all work together to provide you with the care you need, when you need it.  Your next appointment:  In office 3 months

## 2019-09-21 ENCOUNTER — Encounter: Payer: Self-pay | Admitting: *Deleted

## 2019-09-21 ENCOUNTER — Telehealth: Payer: Self-pay | Admitting: *Deleted

## 2019-09-21 NOTE — Telephone Encounter (Signed)
Spoke with patient regarding Cardiac MRI scheduled 10/05/19 at 8:00 am at [facility].  Arrive 7:15 am 1st floor admissions office for check in.  Will mail letter to patient with this information.

## 2019-09-22 ENCOUNTER — Telehealth: Payer: Self-pay | Admitting: *Deleted

## 2019-09-22 DIAGNOSIS — Z006 Encounter for examination for normal comparison and control in clinical research program: Secondary | ICD-10-CM

## 2019-09-22 NOTE — Telephone Encounter (Signed)
Coordinate Diabetes Informed Consent   Subject Name: Joshua [name]  Subject met inclusion and exclusion criteria.  The informed consent form, study requirements and expectations were reviewed with the subject and questions and concerns were addressed prior to the signing of the consent form.  The subject verbalized understanding of the trial requirements.  The subject agreed to participate in the Coordinate Diabetes trial and gave verbal consent at [place] on 09/22/2019.  The informed consent was obtained prior to performance of any protocol-specific procedures for the subject.  A copy of the signed informed consent was mailed to the subject and a copy was placed in the subject's medical record.   Oletta Cohn.   This is a verbal consent

## 2019-09-26 ENCOUNTER — Ambulatory Visit: Payer: Medicare Other | Admitting: Podiatry

## 2019-09-30 ENCOUNTER — Ambulatory Visit (INDEPENDENT_AMBULATORY_CARE_PROVIDER_SITE_OTHER): Payer: Medicare Other

## 2019-09-30 DIAGNOSIS — I421 Obstructive hypertrophic cardiomyopathy: Secondary | ICD-10-CM

## 2019-09-30 DIAGNOSIS — I639 Cerebral infarction, unspecified: Secondary | ICD-10-CM | POA: Diagnosis not present

## 2019-09-30 DIAGNOSIS — I4891 Unspecified atrial fibrillation: Secondary | ICD-10-CM | POA: Diagnosis not present

## 2019-10-03 ENCOUNTER — Telehealth (HOSPITAL_COMMUNITY): Payer: Self-pay | Admitting: Emergency Medicine

## 2019-10-03 NOTE — Telephone Encounter (Signed)
Left message on voicemail with name and callback number Tayte Childers RN Navigator Cardiac Imaging [facility] [phone] Office [phone] Cell  

## 2019-10-04 NOTE — BH Specialist Note (Signed)
Pt presents with h/o Parkinson's Disease and h/o Generalized Anxiety d/o. Pt presents today for supportive counseling re behavioral health management of mood sx including irritability post CVA as mood changes are affecting quality of relationship with wife. Pt also endorses household stress as wife cares for her elderly mother, LCSW offered resources for in-home care which pt was appreciative. Pt responded receptively to supportive counseling, cognitive diffusion techniques to improve distress tolerance, and relationship strengthening approaches to communication. Pt responded receptively to intervention today. Marland KitchenLCSW will remain available for future consultation .  1. Patient to follow up with LCSW: as needed 2. Medication Recommendation: none 3. Behavioral Recommendation(s): A: Implement sacred pause technique B: discuss post CVA adjustment with trusted friends C. Consider in-home care for MIL to reduce household stress-resources provided

## 2019-10-05 ENCOUNTER — Other Ambulatory Visit: Payer: Self-pay

## 2019-10-05 ENCOUNTER — Ambulatory Visit (HOSPITAL_COMMUNITY)
Admission: RE | Admit: 2019-10-05 | Discharge: 2019-10-05 | Disposition: A | Discharge status: Home / Self Care | Payer: Medicare Other | Source: Ambulatory Visit | Attending: Cardiology | Admitting: Cardiology

## 2019-10-05 DIAGNOSIS — I422 Other hypertrophic cardiomyopathy: Secondary | ICD-10-CM | POA: Diagnosis not present

## 2019-10-05 MED ORDER — GADOBUTROL 1 MMOL/ML IV SOLN
10.0000 mL | Freq: Once | INTRAVENOUS | Status: AC | PRN
  Administered 2019-10-05: 10 mL via INTRAVENOUS

## 2019-10-16 NOTE — Progress Notes (Signed)
Cardiology Office Note:    Date:  10/19/2019   ID:  Joshua [name], DOB [birth year], MRN [id number]  PCP:  Joshua Baton, MD  Cardiologist:  No primary care provider on file.  Electrophysiologist:  None   Referring MD: Joshua Baton, MD   Chief Complaint  Patient presents with  . Cardiomyopathy    History of Present Illness:    Joshua [name] is a 68 y.o. male with a hx of hypertension, diabtes, OSA, PE after knee surgery, essential tremor status post DBS June 2020, CVA who presents for follow-up.  Admitted to [facility] from 08/28/19 through 08/31/19 with an acute CVA.  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  MRI brain showed right internal capsule infarct.  Echocardiogram showed no cardiogenic source of embolism.  He was started on heparin given subclavian artery thrombosis, and this was transitioned to Eliquis.  It was unclear if the thrombosis was due to a cardiac source or developed in situ.  Stroke team recommended Eliquis for 2 months and repeating CTA neck; if CTA neck negative and no atrial fibrillation noted on 30-day monitor, stroke team recommended stopping Eliquis and resuming antiplatelet agent.  TTE was notable for an incidental finding of asymmetric basal septal hypertrophy.  He was referred to cardiology and seen on 09/19/19.  Cardiac MRI was ordered, which showed Basal septal hypertrophy measuring up to 63mm (lateral wall 83mm), consistent with hypertrophic cardiomyopathy.  He denies any family history of HCM.  Does have vertigo.  No syncope except with CVA years ago.  He denies any complaints today.   Past Medical History:  Diagnosis Date  . Benign essential tremor   . Benign positional vertigo   . CVA (cerebral vascular accident) ([place])    x2 - L retina, 1 right parietal  . Degenerative arthritis   . Depression   . Diabetes mellitus   . Dyslipidemia   . GERD (gastroesophageal reflux disease)    hiatal hernia  . Gout    . H/O: vasectomy   . Hearing aid worn    b/l  . Hx of appendectomy   . Hx of tonsillectomy   . Hypertension   . 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 Melanoma ([place])   . Obesity   . OSA on CPAP    setting = 5  . Tremor, essential 06/22/2017  . Wears glasses     Past Surgical History:  Procedure Laterality Date  . APPENDECTOMY    . arthroscopic knee surgery Bilateral   . CATARACT EXTRACTION Bilateral   . COLONOSCOPY    . MINOR PLACEMENT OF FIDUCIAL N/A 06/30/2019   Procedure: Fiducial placement;  Surgeon: Joshua Levine, MD;  Location: [place];  Service: Neurosurgery;  Laterality: N/A;  Fiducial placement  . NASAL SEPTUM SURGERY    . PULSE GENERATOR IMPLANT N/A 07/14/2019   Procedure: Left cranial Implanted Pulse Generator and lead extension placement to right chest ;  Surgeon: Joshua Levine, MD;  Location: [place];  Service: Neurosurgery;  Laterality: N/A;  . SUBTHALAMIC STIMULATOR INSERTION Left 07/07/2019   Procedure: LEFT DEEP BRAIN STIMULATOR PLACEMENT;  Surgeon: Joshua Levine, MD;  Location: [place];  Service: Neurosurgery;  Laterality: Left;  . TONSILLECTOMY    . TOTAL KNEE ARTHROPLASTY Left 03/30/2017   Procedure: LEFT TOTAL KNEE ARTHROPLASTY;  Surgeon: Joshua Cancel, MD;  Location: [facility];  Service: Orthopedics;  Laterality: Left;  . WISDOM TOOTH EXTRACTION  Current Medications: Current Meds  Medication Sig  . amLODipine (NORVASC) 5 MG tablet Take 5 mg by mouth at bedtime.   Marland Kitchen apixaban (ELIQUIS) 5 MG TABS tablet Take 1 tablet (5 mg total) by mouth 2 (two) times daily.  Marland Kitchen atorvastatin (LIPITOR) 80 MG tablet Take 1 tablet (80 mg total) by mouth daily at 6 PM.  . DULoxetine (CYMBALTA) 60 MG capsule Take 60 mg by mouth daily.  . fenofibrate 54 MG tablet Take 54 mg by mouth daily.  . hydrochlorothiazide (HYDRODIURIL) 25 MG tablet Take 25 mg by mouth daily.  . Insulin Glargine, 1 Unit Dial, (TOUJEO SOLOSTAR) 300 UNIT/ML SOPN Inject 85 Units into the skin 2 (two) times daily.   . insulin lispro (HUMALOG KWIKPEN) 100 UNIT/ML KwikPen Inject 15-25 Units into the skin See admin instructions. Inject 15 units subcutaneously after breakfast and 25 units after lunch and supper  . losartan (COZAAR) 100 MG tablet Take 100 mg by mouth daily.  . Lutein-Zeaxanthin 25-5 MG CAPS Take 1 tablet by mouth daily.  . metFORMIN (GLUCOPHAGE) 1000 MG tablet Take 1,000 mg by mouth 2 (two) times daily with a meal.  . Multiple Vitamin (MULTIVITAMIN WITH MINERALS) TABS tablet Take 1 tablet by mouth daily.  . Omega-3 Fatty Acids (FISH OIL) 1000 MG CAPS Take 2,000 mg by mouth daily. Reported on 02/13/2016  . ONETOUCH VERIO test strip   . Polyethyl Glycol-Propyl Glycol (SYSTANE OP) Place 1-2 drops into both eyes 4 (four) times daily as needed (dry eyes).   Marland Kitchen PRESCRIPTION MEDICATION Inhale into the lungs at bedtime. CPAP     Allergies:   Patient has no known allergies.   Social History   Socioeconomic History  . Marital status: Married    Spouse name: Joshua [name]  . Number of children: 2  . Years of education: Bachelors   . Highest education level: Bachelor's degree (e.g., BA, AB, BS)  Occupational History  . Occupation: retired    Fish farm manager: [employer]    Comment: teaching/coaching  Social Needs  . Financial resource strain: Not on file  . Food insecurity    Worry: Not on file    Inability: Not on file  . Transportation needs    Medical: Not on file    Non-medical: Not on file  Tobacco Use  . Smoking status: Former Smoker    Packs/day: 1.00    Years: 10.00    Pack years: 10.00    Types: Cigarettes    Quit date: 11/25/1983    Years since quitting: 35.9  . Smokeless tobacco: Never Used  Substance and Sexual Activity  . Alcohol use: Yes    Alcohol/week: 2.0 standard drinks    Types: 2 Standard drinks or equivalent per week    Comment: occassionally  . Drug use: No  . Sexual activity: Not on file  Lifestyle  . Physical activity    Days per week: Not on file    Minutes  per session: Not on file  . Stress: Not on file  Relationships  . Social Herbalist on phone: Not on file    Gets together: Not on file    Attends religious service: Not on file    Active member of club or organization: Not on file    Attends meetings of clubs or organizations: Not on file    Relationship status: Not on file  Other Topics Concern  . Not on file  Social History Narrative   Patient lives at home  with wife. Joshua [name](   Patient has 2 children that are in good health.    Patient works for [employer]. Retired .   Patient has a Bachelors degree in History.      Family History: The patient's family history includes Alzheimer's disease in his father; Cerebral aneurysm in his mother; Diabetes in his maternal grandmother; Healthy in his son; Tremor in his brother, maternal uncle, and mother. There is no history of Colon cancer.  ROS:   Please see the history of present illness.     All other systems reviewed and are negative.  EKGs/Labs/Other Studies Reviewed:    The following studies were reviewed today:   EKG:  EKG is not ordered today.    Cardiac MRI 10/07/19: 1. Limited study, as only a few sequences were able to be completed due to limitations from presence of deep brain stimulator 2. Basal septal hypertrophy measuring up to 70mm (lateral wall 12 mm), consistent with hypertrophic cardiomyopathy 3. Patchy late gadolinium enhancement in basal septum, consistent with HCM 4. Basal inferolateral midwall LGE, which would not be a typical pattern for HCM, as more commonly seen in setting of prior myocarditis or sarcoidosis. 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  Normal LV size with hyperdynamic systolic function (EF [id number]) 6.  Normal RV size and systolic function (EF [id number])  TTE 08/29/19: 1. Technically difficult study. Left ventricular ejection fraction appears grossly normal, approximately 55-60%,  though difficult visualization even with contrast 2. There is asymmetric basal septal hypertrophy measuring 18 mm in basal septum (12 mm posterior wall). Consider cardiac MRI to assess for hypertrophic cardiomyopathy if clinically indicated 3. Definity contrast agent was given IV to delineate the left ventricular endocardial borders. 4. Global right ventricle has normal systolic function.The right ventricular size is normal. No increase in right ventricular wall thickness. 5. There is mild dilatation of the aortic root measuring 39 mm. 6. The inferior vena cava is dilated in size with <50% respiratory variability, suggesting right atrial pressure of 15 mmHg.  Recent Labs: 08/28/2019: ALT 16 08/31/2019: BUN 19; Creatinine, Ser 1.33; Hemoglobin 12.9; Platelets 270; Potassium 3.9; Sodium 140  Recent Lipid Panel    Component Value Date/Time   CHOL 183 08/29/2019 0409   TRIG 249 (H) 08/29/2019 0409   HDL 26 (L) 08/29/2019 0409   CHOLHDL 7.0 08/29/2019 0409   VLDL 50 (H) 08/29/2019 0409   LDLCALC 107 (H) 08/29/2019 0409    Physical Exam:    VS:  BP 115/77   Pulse (!) 109   Ht 6\' 4"  (1.93 m)   Wt 259 lb 9.6 oz (117.8 kg)   SpO2 99%   BMI 31.60 kg/m     Wt Readings from Last 3 Encounters:  10/19/19 259 lb (117.5 kg)  10/17/19 259 lb 9.6 oz (117.8 kg)  09/19/19 263 lb (119.3 kg)     GEN: Well nourished, well developed in no acute distress HEENT: Normal NECK: No JVD LYMPHATICS: No lymphadenopathy CARDIAC: RRR, no murmurs, rubs, gallops RESPIRATORY:  Clear to auscultation without rales, wheezing or rhonchi  ABDOMEN: Soft, non-tender, non-distended MUSCULOSKELETAL:  No edema; No deformity  SKIN: Warm and dry NEUROLOGIC:  Alert and oriented x 3 PSYCHIATRIC:  Normal affect   ASSESSMENT:    1. Hypertrophic cardiomyopathy ([place])   2. Cerebrovascular accident (CVA), unspecified mechanism ([place])   3. Essential hypertension   4. Hyperlipidemia, unspecified hyperlipidemia type     PLAN:    In order  of problems listed above:  Asymmetric basal septal hypertrophy: 14mm (lateral wall 12 mm) on CMR.  Likely HCM.  However, MRI was limited due to his deep brain stimulator.  In addition, had unusual scar pattern, with basal inferolateral scar on MRI.  Fabry's disease can be associated with this scar pattern and asymmetric hypertrophy, and has been reported to have isolated cardiac involvement.  Will check alpha galactosidase.  Amyloid is also on differential.  Scar pattern not c/w amyloid on CMR, but unfortunately unable to do T1 mapping to r/o amyloid due to DBS.  Will check PYP scan to rule out ATTR amyloidosis.    CVA: Subclavian artery thrombosis diagnosed, unclear if cardioembolic source or developed in situ.  Currently on Eliquis.  Will follow-up 30 day monitor  Hypertension: On amlodipine 5 mg daily, hydrochlorothiazide 25 mg daily, losartan 100 mg daily.  Appears well controlled.  Hyperlipidemia: On atorvastatin 80 mg daily.  LDL 107 on 08/29/2019  Type 2 diabetes: A1c 7.8%.  On insulin  RTC in 3 months   Medication Adjustments/Labs and Tests Ordered: Current medicines are reviewed at length with the patient today.  Concerns regarding medicines are outlined above.  Orders Placed This Encounter  Procedures  . Alpha-Gal Panel  . Alpha galactosidase  . MYOCARDIAL AMYLOID IMAGING PLANAR AND SPECT   No orders of the defined types were placed in this encounter.   Patient Instructions  Medication Instructions:  Continue same medications   Lab Work: Alpha Gal today   Testing/Procedures: Schedule Myocardial Amyloid   Follow-Up: At [facility], you and your health needs are our priority.  As part of our continuing mission to provide you with exceptional heart care, we have created designated Provider Care Teams.  These Care Teams include your primary Cardiologist (physician) and Advanced Practice Providers (APPs -  Physician Assistants and Nurse  Practitioners) who all work together to provide you with the care you need, when you need it.  Your next appointment:  3 months 12/2010  The format for your next appointment:  Office   Provider:  Dr.Farhaan Mabee   Children need to have Echocardiogram for screening of Hypertrophic Cardiomyopathy      Signed, Donato Heinz, MD  10/19/2019 2:26 PM    [place]

## 2019-10-17 ENCOUNTER — Ambulatory Visit: Payer: Medicare Other | Admitting: Cardiology

## 2019-10-17 ENCOUNTER — Other Ambulatory Visit: Payer: Self-pay

## 2019-10-17 ENCOUNTER — Encounter: Payer: Self-pay | Admitting: Cardiology

## 2019-10-17 VITALS — BP 115/77 | HR 109 | Ht 76.0 in | Wt 259.6 lb

## 2019-10-17 DIAGNOSIS — I639 Cerebral infarction, unspecified: Secondary | ICD-10-CM

## 2019-10-17 DIAGNOSIS — I10 Essential (primary) hypertension: Secondary | ICD-10-CM

## 2019-10-17 DIAGNOSIS — I422 Other hypertrophic cardiomyopathy: Secondary | ICD-10-CM | POA: Diagnosis not present

## 2019-10-17 DIAGNOSIS — E785 Hyperlipidemia, unspecified: Secondary | ICD-10-CM

## 2019-10-17 NOTE — Patient Instructions (Addendum)
Medication Instructions:  Continue same medications   Lab Work: International aid/development worker Gal today   Testing/Procedures: Schedule Myocardial Amyloid   Follow-Up: At [facility], you and your health needs are our priority.  As part of our continuing mission to provide you with exceptional heart care, we have created designated Provider Care Teams.  These Care Teams include your primary Cardiologist (physician) and Advanced Practice Providers (APPs -  Physician Assistants and Nurse Practitioners) who all work together to provide you with the care you need, when you need it.  Your next appointment:  3 months 12/2010  The format for your next appointment:  Office   Provider:  Dr.Schumann   Children need to have Echocardiogram for screening of Hypertrophic Cardiomyopathy

## 2019-10-18 ENCOUNTER — Telehealth (HOSPITAL_COMMUNITY): Payer: Self-pay

## 2019-10-18 NOTE — Telephone Encounter (Signed)
Encounter complete. 

## 2019-10-19 ENCOUNTER — Other Ambulatory Visit: Payer: Self-pay

## 2019-10-19 ENCOUNTER — Telehealth: Payer: Self-pay | Admitting: Cardiology

## 2019-10-19 ENCOUNTER — Ambulatory Visit (HOSPITAL_COMMUNITY)
Admission: RE | Admit: 2019-10-19 | Discharge: 2019-10-19 | Disposition: A | Discharge status: Home / Self Care | Payer: Medicare Other | Source: Ambulatory Visit | Attending: Cardiovascular Disease | Admitting: Cardiovascular Disease

## 2019-10-19 DIAGNOSIS — I422 Other hypertrophic cardiomyopathy: Secondary | ICD-10-CM

## 2019-10-19 MED ORDER — TECHNETIUM TC 99M PYROPHOSPHATE
21.4000 | Freq: Once | INTRAVENOUS | Status: AC
  Administered 2019-10-19: 21.4 via INTRAVENOUS

## 2019-10-19 NOTE — Telephone Encounter (Signed)
Patient states he received a call from our office. Patient had a Myocardial Amyloid today and was wondering if it was in regards to this. I did not see a reason why someone was calling him.

## 2019-10-19 NOTE — Telephone Encounter (Signed)
I do not see a message than anyone here called, pt will await call back from whomever called him.

## 2019-10-21 LAB — ALPHA-GAL PANEL
Alpha Gal IgE*: <0.1 kU/L (ref <0.10)
Beef (Bos spp) IgE: <0.1 kU/L (ref <0.35)
Class Interpretation: 0
Class Interpretation: 0
Class Interpretation: 0
Lamb/Mutton (Ovis spp) IgE: <0.1 kU/L (ref <0.35)
Pork (Sus spp) IgE: <0.1 kU/L (ref <0.35)

## 2019-10-26 ENCOUNTER — Other Ambulatory Visit: Payer: Self-pay | Admitting: Cardiology

## 2019-10-26 ENCOUNTER — Other Ambulatory Visit: Payer: Self-pay

## 2019-10-26 DIAGNOSIS — I422 Other hypertrophic cardiomyopathy: Secondary | ICD-10-CM

## 2019-10-27 ENCOUNTER — Telehealth: Payer: Self-pay | Admitting: Neurology

## 2019-10-27 NOTE — Telephone Encounter (Signed)
Patient called and left message with after hours wanting to know when he surgery will be rescheduled? Please Call. Thank you

## 2019-10-27 NOTE — Telephone Encounter (Signed)
i'm not sure what he is talking about.  Last I knew (see last note), he wasn't going to do the other side because tremor was mild on the other side.  Did he change his mind??

## 2019-10-27 NOTE — Telephone Encounter (Signed)
Patient was just calling to get the date of his surgery for a claim he is filing for a vacation home

## 2019-10-27 NOTE — Telephone Encounter (Signed)
Please advise 

## 2019-11-04 LAB — ALPHA GALACTOSIDASE: Alpha-Galactosidase activity: 42.7 nmol/hr/mg prt (ref >35.5)

## 2019-11-08 ENCOUNTER — Telehealth: Payer: Self-pay

## 2019-11-08 MED ORDER — APIXABAN 5 MG PO TABS: 5.0000 mg | ORAL_TABLET | Freq: Two times a day (BID) | ORAL | 1 refills | Status: DC

## 2019-11-08 NOTE — Telephone Encounter (Signed)
Pt requesting Eliquis refill.

## 2019-11-09 ENCOUNTER — Other Ambulatory Visit: Payer: Self-pay | Admitting: Cardiology

## 2019-11-09 DIAGNOSIS — I421 Obstructive hypertrophic cardiomyopathy: Secondary | ICD-10-CM

## 2019-11-09 DIAGNOSIS — I4891 Unspecified atrial fibrillation: Secondary | ICD-10-CM

## 2019-11-09 DIAGNOSIS — I639 Cerebral infarction, unspecified: Secondary | ICD-10-CM

## 2019-11-15 NOTE — Progress Notes (Signed)
Joshua [name] was seen today in follow up for Essential tremor.  Medical records are reviewed since our last visit.  Patient is status post left VIM DBS surgery.  Patient's tremor has been good on the right side (still has a little on the left since no lead was placed) with the exception of noting it when brushing teeth and holding a cup of coffee, and that is new..  Patient also has a history of stroke in October.  He saw cardiology on October 26 and November 23.  Cardiac MRI was ordered and demonstrated evidence of hypertrophic cardiomyopathy.  He subsequently had patient a myocardial amyloid stress test to determine the presence of amyloid, which was negative.  Did have a 30-day event monitor that was unremarkable.  Pt does state that his A1C is down to 7.1.  He did have a fall since last visit. He was putting up [employer] and fell over a bush.  He didn't get hurt.  He wasn't on a ladder.  Noting that since the stroke he is getting choked "a lot" on liquid.  He also feels that he has an overabundance of saliva.  Current Movement d/o medications: n/a    ALLERGIES:  No Known Allergies  CURRENT MEDICATIONS:  Outpatient Encounter Medications as of 11/28/2019  Medication Sig  . amLODipine (NORVASC) 5 MG tablet Take 5 mg by mouth at bedtime.   Marland Kitchen apixaban (ELIQUIS) 5 MG TABS tablet Take 1 tablet (5 mg total) by mouth 2 (two) times daily.  Marland Kitchen atorvastatin (LIPITOR) 80 MG tablet Take 1 tablet (80 mg total) by mouth daily at 6 PM.  . DULoxetine (CYMBALTA) 60 MG capsule Take 60 mg by mouth daily.  . fenofibrate 54 MG tablet Take 54 mg by mouth daily.  . hydrochlorothiazide (HYDRODIURIL) 25 MG tablet Take 25 mg by mouth daily.  . Insulin Glargine, 1 Unit Dial, (TOUJEO SOLOSTAR) 300 UNIT/ML SOPN Inject 85 Units into the skin 2 (two) times daily.  . insulin lispro (HUMALOG KWIKPEN) 100 UNIT/ML KwikPen Inject 15-25 Units into the skin See admin instructions. Inject 15 units subcutaneously after  breakfast and 25 units after lunch and supper  . losartan (COZAAR) 100 MG tablet Take 100 mg by mouth daily.  . Lutein-Zeaxanthin 25-5 MG CAPS Take 1 tablet by mouth daily.  . metFORMIN (GLUCOPHAGE) 1000 MG tablet Take 1,000 mg by mouth 2 (two) times daily with a meal.  . Multiple Vitamin (MULTIVITAMIN WITH MINERALS) TABS tablet Take 1 tablet by mouth daily.  . Omega-3 Fatty Acids (FISH OIL) 1000 MG CAPS Take 2,000 mg by mouth daily. Reported on 02/13/2016  . ONETOUCH VERIO test strip   . Polyethyl Glycol-Propyl Glycol (SYSTANE OP) Place 1-2 drops into both eyes 4 (four) times daily as needed (dry eyes).   Marland Kitchen PRESCRIPTION MEDICATION Inhale into the lungs at bedtime. CPAP   No facility-administered encounter medications on file as of 11/28/2019.    PAST MEDICAL HISTORY:   Past Medical History:  Diagnosis Date  . Benign essential tremor   . Benign positional vertigo   . CVA (cerebral vascular accident) ([place])    x2 - L retina, 1 right parietal  . Degenerative arthritis   . Depression   . Diabetes mellitus   . Dyslipidemia   . GERD (gastroesophageal reflux disease)    hiatal hernia  . Gout   . H/O: vasectomy   . Hearing aid worn    b/l  . Hx of appendectomy   .  Hx of tonsillectomy   . Hypertension   . 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 Melanoma ([place])   . Obesity   . OSA on CPAP    setting = 5  . Tremor, essential 06/22/2017  . Wears glasses     PAST SURGICAL HISTORY:   Past Surgical History:  Procedure Laterality Date  . APPENDECTOMY    . arthroscopic knee surgery Bilateral   . CATARACT EXTRACTION Bilateral   . COLONOSCOPY    . MINOR PLACEMENT OF FIDUCIAL N/A 06/30/2019   Procedure: Fiducial placement;  Surgeon: Erline Levine, MD;  Location: [facility];  Service: Neurosurgery;  Laterality: N/A;  Fiducial placement  . NASAL SEPTUM SURGERY    . PULSE GENERATOR IMPLANT N/A 07/14/2019   Procedure: Left cranial Implanted Pulse Generator and lead extension placement to right  chest ;  Surgeon: Erline Levine, MD;  Location: [facility];  Service: Neurosurgery;  Laterality: N/A;  . SUBTHALAMIC STIMULATOR INSERTION Left 07/07/2019   Procedure: LEFT DEEP BRAIN STIMULATOR PLACEMENT;  Surgeon: Erline Levine, MD;  Location: [facility];  Service: Neurosurgery;  Laterality: Left;  . TONSILLECTOMY    . TOTAL KNEE ARTHROPLASTY Left 03/30/2017   Procedure: LEFT TOTAL KNEE ARTHROPLASTY;  Surgeon: Paralee Cancel, MD;  Location: [facility];  Service: Orthopedics;  Laterality: Left;  . WISDOM TOOTH EXTRACTION      SOCIAL HISTORY:   Social History   Socioeconomic History  . Marital status: Married    Spouse name: [name]  . Number of children: 2  . Years of education: Bachelors   . Highest education level: Bachelor's degree (e.g., BA, AB, BS)  Occupational History  . Occupation: retired    Fish farm manager: [employer]    Comment: teaching/coaching  Tobacco Use  . Smoking status: Former Smoker    Packs/day: 1.00    Years: 10.00    Pack years: 10.00    Types: Cigarettes    Quit date: 11/25/1983    Years since quitting: 36.0  . Smokeless tobacco: Never Used  Substance and Sexual Activity  . Alcohol use: Yes    Alcohol/week: 2.0 standard drinks    Types: 2 Standard drinks or equivalent per week    Comment: occassionally  . Drug use: No  . Sexual activity: Not on file  Other Topics Concern  . Not on file  Social History Narrative   Patient lives at home with wife. [name](   Patient has 2 children that are in good health.    Patient works for [employer]. Retired .   Patient has a Bachelors degree in History.    Social Determinants of Health   Financial Resource Strain:   . Difficulty of Paying Living Expenses: Not on file  Food Insecurity:   . Worried About Charity fundraiser in the Last Year: Not on file  . Ran Out of Food in the Last Year: Not on file  Transportation Needs:   . Lack of Transportation (Medical): Not on file  . Lack of Transportation  (Non-Medical): Not on file  Physical Activity:   . Days of Exercise per Week: Not on file  . Minutes of Exercise per Session: Not on file  Stress:   . Feeling of Stress : Not on file  Social Connections:   . Frequency of Communication with Friends and Family: Not on file  . Frequency of Social Gatherings with Friends and Family: Not on file  . Attends Religious Services: Not on file  .  Active Member of Clubs or Organizations: Not on file  . Attends Archivist Meetings: Not on file  . Marital Status: Not on file  Intimate Partner Violence:   . Fear of Current or Ex-Partner: Not on file  . Emotionally Abused: Not on file  . Physically Abused: Not on file  . Sexually Abused: Not on file    FAMILY HISTORY:   Family Status  Relation Name Status  . Mother  Deceased at age 61       Cerebral aneurysm  . Father  Deceased at age 57       Alzheimer's disease  . Brother  Alive  . Mat Uncle  (Not Specified)  . MGM  (Not Specified)  . Son 2 Alive  . Neg Hx  (Not Specified)    PHYSICAL EXAMINATION:    VITALS:   Vitals:   11/28/19 1015  BP: 135/80  Pulse: 91  SpO2: 99%  Weight: 264 lb 2 oz (119.8 kg)  Height: 6\' 4"  (1.93 m)    GEN:  The patient appears stated age and is in NAD. HEENT:  Normocephalic, atraumatic.  The mucous membranes are moist. The superficial temporal arteries are without ropiness or tenderness. CV:  RRR Lungs:  CTAB Neck/HEME:  There are no carotid bruits bilaterally.  Neurological examination:  Orientation: The patient is alert and oriented x3. Cranial nerves: There is good facial symmetry. The speech is fluent and clear. Soft palate rises symmetrically and there is no tongue deviation. Hearing is decreased to conversational tone. Sensation: Sensation is intact to light touch throughout Motor: Strength is at least antigravity x4.  Movement examination: Tone: There is normal tone in the UE/LE Abnormal movements: very mild intention tremor on  the R when given a weight.  He has mild postural and intention tremor on the left hand. Coordination:  There is no decremation with RAM's. Gait and Station: The patient ambulates well in the hall   ASSESSMENT/PLAN:  1.   Cerebral infarct, 08/2019             -MRI demonstrated acute right posterior limb of the internal capsule infarct.  However, stroke neurology felt that symptoms were more consistent with a posterior circulation stroke, related to thrombus in the left subclavian (although patient certainly does have mild left facial droop today).  CTA of the head and neck were unremarkable.               -Echocardiogram without source of thrombus.  Ejection fraction 55 to 60%.             -LDL 107.  Goal is less than 70.  Was on Lipitor 40.  Is now on Lipitor 80 mg.  Discussed proper diet as well.             -Diabetes was not ideally controlled.  A1C at time of event 7.8.  Discussed of having a goal of less than 7.  states that it is currently 7.1  He feels like he is doing much better diet wise             -Discussed stroke risk factors, including prior stroke, age, obesity, sleep apnea, diabetes, hyperlipidemia, hypertension.  Discussed modifiable risk factors.             -Patient has had a -30-day event monitor via cardiology that was neg.  Hypertrophic cardiomyopathy was identified, however, on cardiac MRI.  -We will repeat the patient's CTA of the neck to see if  the subclavian thrombus has resolved.  If so, okay by me if cardiology would like to change his Eliquis back to antiplatelet.  However, now that he is dx with hypertrophic cardiomyopathy, this may have been changed.  -Discussed options for loop recorder, if he is off of the Eliquis.             2.  Essential Tremor, with slight exacerbation today - Patient underwent left VIM DBS on July 07, 2019. The intention was originally to do bilateral VIM DBS, but the patient had a vagal episode in the operating room with  associated nausea and vomiting. He lost CSF during the episode, and it was decided not to attempt the other side at that point in time. Patient has opted to hold on having a lead placed in the right VIM. He did still have tremor on the left, but it is fairly mild.  Adjusted DBS today due to mild R hand tremor.  Pt shown how to use pt remote as he didn't know how to adjust himself -Patient had his IPG placed on July 14, 2019.     3. Peripheral neuropathy  -The patient has clinical examination evidence of a diffuse peripheral neuropathy, which certainly can affect gait and balance.  This is likely due to DM   4.  Dysphagia  -will order MBE  -may need ST depending on results of the above  -thinks that has trouble with excess saliva.  Could consider botox if sialorrhea depending on results of above.  5.  F/u 6 months.  Total time spent on today's visit was greater than 40 minutes, including both face-to-face time and nonface-to-face time.  Time included that spent on review of records (prior notes available to me/labs/imaging if pertinent), discussing treatment and goals, answering patient's questions and coordinating care.  This did not include DBS time    Cc:  [name], MD

## 2019-11-28 ENCOUNTER — Other Ambulatory Visit: Payer: Self-pay

## 2019-11-28 ENCOUNTER — Encounter: Payer: Self-pay | Admitting: Neurology

## 2019-11-28 ENCOUNTER — Ambulatory Visit (INDEPENDENT_AMBULATORY_CARE_PROVIDER_SITE_OTHER): Payer: Medicare PPO | Admitting: Neurology

## 2019-11-28 VITALS — BP 135/80 | HR 91 | Ht 76.0 in | Wt 264.1 lb

## 2019-11-28 DIAGNOSIS — I748 Embolism and thrombosis of other arteries: Secondary | ICD-10-CM

## 2019-11-28 DIAGNOSIS — R1312 Dysphagia, oropharyngeal phase: Secondary | ICD-10-CM | POA: Diagnosis not present

## 2019-11-28 DIAGNOSIS — G250 Essential tremor: Secondary | ICD-10-CM | POA: Diagnosis not present

## 2019-11-28 NOTE — Patient Instructions (Signed)
1.  We will get you scheduled for your CTA of the neck to look at the thrombus you had 2.  We will get you scheduled for you modified barium swallow  3.  I will see you in 6 months  The physicians and staff at [facility] are committed to providing excellent care. You may receive a survey requesting feedback about your experience at our office. We strive to receive "very good" responses to the survey questions. If you feel that your experience would prevent you from giving the office a "very good " response, please contact our office to try to remedy the situation. We may be reached at [phone]. Thank you for taking the time out of your busy day to complete the survey.

## 2019-11-28 NOTE — Procedures (Signed)
DBS Programming was performed.    Manufacturer of DBS device: [employer] was performed.    Manufacturer of DBS device: [employer]  Total time spent programming was 15 minutes.  Device was confirmed to be on.  Soft start was confirmed to be on.  Impedences were checked and were within normal limits.  Battery was checked and was determined to be functioning normally and not near the end of life.  Final settings were as follows:   Active Contacts Amplitude (mA) PW (ms) Frequency (hz)   Left Brain       11/28/2019 5-C+ 2.5 60 130                        Program 2        5-8+ 2.2 60 130          Right Brain       Not active                        Prior visit:  Monopolar review: Left brain electrode:     1-C+           ; Amplitude  1.0   ma   ; Pulse width 60 microseconds;   Frequency   130   Hz.  (Tongue paresthesias) Left brain electrode:     (2-3-4-) 33% each C+           ; Amplitude  2.0   ma   ; Pulse width 60 microseconds;   Frequency   130   Hz.  (Tongue and lip paresthesias, fairly good tremor control) Left brain electrode:     (5-6-7-) 33% C+           ; Amplitude  2.0   ma   ; Pulse width 60 microseconds;   Frequency   130   Hz.  (Tongue paresthesias but no lip paresthesia and good tremor control) Left brain electrode:     8-C+           ; Amplitude  1.0   ma   ; Pulse width 60 microseconds;   Frequency   130   Hz.  (Paresthesias resolved)

## 2019-11-29 ENCOUNTER — Other Ambulatory Visit (HOSPITAL_COMMUNITY): Payer: Self-pay | Admitting: *Deleted

## 2019-11-29 DIAGNOSIS — R1310 Dysphagia, unspecified: Secondary | ICD-10-CM

## 2019-12-05 ENCOUNTER — Ambulatory Visit
Admission: RE | Admit: 2019-12-05 | Discharge: 2019-12-05 | Disposition: A | Discharge status: Home / Self Care | Payer: Medicare PPO | Source: Ambulatory Visit | Attending: Neurology | Admitting: Neurology

## 2019-12-05 ENCOUNTER — Telehealth: Payer: Self-pay | Admitting: Neurology

## 2019-12-05 DIAGNOSIS — I748 Embolism and thrombosis of other arteries: Secondary | ICD-10-CM

## 2019-12-05 MED ORDER — IOPAMIDOL (ISOVUE-370) INJECTION 76%
75.0000 mL | Freq: Once | INTRAVENOUS | Status: AC | PRN
  Administered 2019-12-05: 75 mL via INTRAVENOUS

## 2019-12-05 NOTE — Telephone Encounter (Signed)
Please let pt know that CTA head looked good and the thrombus there is gone.  Howwever, my intention was really to look at CTA of the NECK (see note) and look for thrombus in the subclavian.  If agreeable, please order CTA neck w and w/o.  Dx:  Subclavian artery thrombus

## 2019-12-06 ENCOUNTER — Telehealth: Payer: Self-pay

## 2019-12-06 ENCOUNTER — Other Ambulatory Visit: Payer: Self-pay

## 2019-12-06 ENCOUNTER — Ambulatory Visit (HOSPITAL_COMMUNITY)
Admission: RE | Admit: 2019-12-06 | Discharge: 2019-12-06 | Disposition: A | Discharge status: Home / Self Care | Payer: Medicare PPO | Source: Ambulatory Visit | Attending: Neurology | Admitting: Neurology

## 2019-12-06 DIAGNOSIS — R1312 Dysphagia, oropharyngeal phase: Secondary | ICD-10-CM | POA: Insufficient documentation

## 2019-12-06 DIAGNOSIS — R1310 Dysphagia, unspecified: Secondary | ICD-10-CM | POA: Insufficient documentation

## 2019-12-06 NOTE — Telephone Encounter (Signed)
Spoke with PT informed him  that CTA head looked good and the thrombus there is gone.  would like to order and look at CTA of the NECK (see note) and look for thrombus in the subclavian. Pt verbalized understanding an agreed to CTA of neck order was placed in Epic

## 2019-12-06 NOTE — Progress Notes (Signed)
Modified Barium Swallow Progress Note  Patient Details  Name: Joshua [name] MRN: [id number] Date of Birth: 10-Jan-1951  Today's Date: 12/06/2019  Modified Barium Swallow completed.  Full report located under Chart Review in the Imaging Section.  Brief recommendations include the following:  Clinical Impression  Pt has a normal oropharyngeal swallow across challenging that include large boluses, fast rates, and mixed consistencies. He did have intermittent throat clearing throughout the study but no overt coughing like he has been experiencing at home. He does endorse drinking rapidly at times or sitting in a partially reclined/slouched position during PO intake. We discussed using general aspiration precautions such as slower rate and upright positioning to try to reduce his symptoms. I also encouraged him to look for any other patterns he may find or any worsening of his sypmtoms so that he can continue to inform his provider. Would continue regular solids and thin liquids for now.    Swallow Evaluation Recommendations       SLP Diet Recommendations: Regular solids;Thin liquid   Liquid Administration via: Cup;Straw   Medication Administration: Whole meds with liquid   Supervision: Patient able to self feed   Compensations: Slow rate;Small sips/bites   Postural Changes: Seated upright at 90 degrees   Oral Care Recommendations: Oral care BID         Osie Bond., M.A. [place] Pager [phone] Office [phone]  12/06/2019,1:24 PM

## 2019-12-15 ENCOUNTER — Emergency Department (HOSPITAL_BASED_OUTPATIENT_CLINIC_OR_DEPARTMENT_OTHER)
Admission: EM | Admit: 2019-12-15 | Discharge: 2019-12-15 | Disposition: A | Discharge status: Home / Self Care | Payer: No Typology Code available for payment source | Attending: Emergency Medicine | Admitting: Emergency Medicine

## 2019-12-15 ENCOUNTER — Encounter (HOSPITAL_BASED_OUTPATIENT_CLINIC_OR_DEPARTMENT_OTHER): Payer: Self-pay

## 2019-12-15 ENCOUNTER — Emergency Department (HOSPITAL_BASED_OUTPATIENT_CLINIC_OR_DEPARTMENT_OTHER): Payer: No Typology Code available for payment source

## 2019-12-15 ENCOUNTER — Other Ambulatory Visit: Payer: Self-pay

## 2019-12-15 ENCOUNTER — Telehealth: Payer: Self-pay | Admitting: Neurology

## 2019-12-15 DIAGNOSIS — E119 Type 2 diabetes mellitus without complications: Secondary | ICD-10-CM | POA: Diagnosis not present

## 2019-12-15 DIAGNOSIS — Z7984 Long term (current) use of oral hypoglycemic drugs: Secondary | ICD-10-CM | POA: Diagnosis not present

## 2019-12-15 DIAGNOSIS — Z86711 Personal history of pulmonary embolism: Secondary | ICD-10-CM | POA: Diagnosis not present

## 2019-12-15 DIAGNOSIS — Z96659 Presence of unspecified artificial knee joint: Secondary | ICD-10-CM | POA: Diagnosis not present

## 2019-12-15 DIAGNOSIS — R55 Syncope and collapse: Secondary | ICD-10-CM | POA: Insufficient documentation

## 2019-12-15 DIAGNOSIS — Z7901 Long term (current) use of anticoagulants: Secondary | ICD-10-CM | POA: Insufficient documentation

## 2019-12-15 DIAGNOSIS — Z87891 Personal history of nicotine dependence: Secondary | ICD-10-CM | POA: Diagnosis not present

## 2019-12-15 DIAGNOSIS — N189 Chronic kidney disease, unspecified: Secondary | ICD-10-CM | POA: Diagnosis not present

## 2019-12-15 DIAGNOSIS — I1310 Hypertensive heart and chronic kidney disease without heart failure, with stage 1 through stage 4 chronic kidney disease, or unspecified chronic kidney disease: Secondary | ICD-10-CM | POA: Diagnosis not present

## 2019-12-15 DIAGNOSIS — Z79899 Other long term (current) drug therapy: Secondary | ICD-10-CM | POA: Insufficient documentation

## 2019-12-15 DIAGNOSIS — I639 Cerebral infarction, unspecified: Secondary | ICD-10-CM | POA: Diagnosis not present

## 2019-12-15 LAB — CBC WITH DIFFERENTIAL/PLATELET
Abs Immature Granulocytes: 0.05 10*3/uL (ref 0.00–0.07)
Basophils Absolute: 0.1 10*3/uL (ref 0.0–0.1)
Basophils Relative: 1 %
Eosinophils Absolute: 0.5 10*3/uL (ref 0.0–0.5)
Eosinophils Relative: 5 %
HCT: 39.8 % (ref 39.0–52.0)
Hemoglobin: 12.8 g/dL — ABNORMAL LOW (ref 13.0–17.0)
Immature Granulocytes: 1 %
Lymphocytes Relative: 35 %
Lymphs Abs: 3.2 10*3/uL (ref 0.7–4.0)
MCH: 29.7 pg (ref 26.0–34.0)
MCHC: 32.2 g/dL (ref 30.0–36.0)
MCV: 92.3 fL (ref 80.0–100.0)
Monocytes Absolute: 0.4 10*3/uL (ref 0.1–1.0)
Monocytes Relative: 5 %
Neutro Abs: 5 10*3/uL (ref 1.7–7.7)
Neutrophils Relative %: 53 %
Platelets: 263 10*3/uL (ref 150–400)
RBC: 4.31 MIL/uL (ref 4.22–5.81)
RDW: 13.2 % (ref 11.5–15.5)
WBC: 9.2 10*3/uL (ref 4.0–10.5)
nRBC: 0 % (ref 0.0–0.2)

## 2019-12-15 LAB — BASIC METABOLIC PANEL
Anion gap: 9 (ref 5–15)
BUN: 26 mg/dL — ABNORMAL HIGH (ref 8–23)
CO2: 25 mmol/L (ref 22–32)
Calcium: 9 mg/dL (ref 8.9–10.3)
Chloride: 105 mmol/L (ref 98–111)
Creatinine, Ser: 1.57 mg/dL — ABNORMAL HIGH (ref 0.61–1.24)
GFR calc Af Amer: 51 mL/min — ABNORMAL LOW (ref >60)
GFR calc non Af Amer: 44 mL/min — ABNORMAL LOW (ref >60)
Glucose, Bld: 220 mg/dL — ABNORMAL HIGH (ref 70–99)
Potassium: 4.2 mmol/L (ref 3.5–5.1)
Sodium: 139 mmol/L (ref 135–145)

## 2019-12-15 LAB — MAGNESIUM: Magnesium: 1.7 mg/dL (ref 1.7–2.4)

## 2019-12-15 NOTE — ED Triage Notes (Signed)
Pt got out of the bed in the middle of the night to use the restroom and as soon as he stood up he had a syncopal episode. Pt reports he did hit his head when he fell. Pt is on Eloquis. A/Ox4 in triage.

## 2019-12-15 NOTE — Telephone Encounter (Signed)
This wouldn't be from the DBS I don't think but he has had some heart issues (cardiomyopathy) and cardiology should be aware of that.  Did he get evaluated after this, as he obviously hit his head with a bloody nose (and he is on blood thinners).  If not, we should go ahead and do a head CT (no contrast) just to make sure that everything looks ok (please order asap) and he should go to the ER if new neuro s/s develop or if he has MS change.  He needs to make an appt with cardiology.  No driving per [place] driving laws x 6 months

## 2019-12-15 NOTE — Telephone Encounter (Signed)
Patient called to report a fall with loss of consciousness or a "blackout" this past Tuesday, 12/15/19, while he was getting up in the morning in [place], [place]. He said he fell hard and got a bloody nose but no other injuries. Patient expressed concern because he had a DBS implanted in August 2020.

## 2019-12-15 NOTE — Telephone Encounter (Signed)
Just wanted you to know this. FYI

## 2019-12-16 NOTE — Telephone Encounter (Signed)
Left message this am to call office at [phone]

## 2019-12-16 NOTE — Telephone Encounter (Signed)
Patient returned call to Christy. 

## 2019-12-16 NOTE — Telephone Encounter (Signed)
Pt did go to the ER yesterday and is being followed by cardiologist.

## 2019-12-21 ENCOUNTER — Telehealth: Payer: Self-pay

## 2019-12-21 ENCOUNTER — Ambulatory Visit
Admission: RE | Admit: 2019-12-21 | Discharge: 2019-12-21 | Disposition: A | Discharge status: Home / Self Care | Payer: Medicare PPO | Source: Ambulatory Visit | Attending: Neurology | Admitting: Neurology

## 2019-12-21 ENCOUNTER — Other Ambulatory Visit: Payer: Self-pay

## 2019-12-21 DIAGNOSIS — I748 Embolism and thrombosis of other arteries: Secondary | ICD-10-CM

## 2019-12-21 MED ORDER — IOPAMIDOL (ISOVUE-370) INJECTION 76%
75.0000 mL | Freq: Once | INTRAVENOUS | Status: AC | PRN
  Administered 2019-12-21: 75 mL via INTRAVENOUS

## 2019-12-21 NOTE — Telephone Encounter (Signed)
-----   Message from [place], DO sent at 12/21/2019  2:30 PM EST ----- Let pt know that subclavian artery thrombus is stable (unchanged).  He is already on the blood thinner for it.  Lets refer to cardiothoracic sx to make sure nothing else to do (I doubt) if patient agreeable.

## 2019-12-21 NOTE — Telephone Encounter (Signed)
Called pt informed that subclavian artery thrombus is stable (unchanged).  He is already on the blood thinner for it.  Lets refer to cardiothoracic sx to make sure nothing else to do? if patient agreeable. Pt stated that he is good and at this time does not need to see cardiothoracic sx with everything being stable,

## 2019-12-21 NOTE — ED Provider Notes (Signed)
[facility] EMERGENCY DEPARTMENT Provider Note   CSN: [id number] Arrival date & time: 12/15/19  1746     History Chief Complaint  Patient presents with  . Loss of Consciousness    Joshua [name] is a 69 y.o. male.  HPI   69 year old male with syncope.  Got in the night to use the bathroom when he fell.  He is not exactly sure how/why he fell.  Did strike his head/face.  Developed epistaxis which has since resolved.  He is on Eliquis.  He discussed with his family doctor who recommended he obtain imaging of his head.  He denies any segment headaches.  No neck pain.  No acute numbness, tingling focal loss of strength.  No acute visual changes or persistent nausea.  Past Medical History:  Diagnosis Date  . Benign essential tremor   . Benign positional vertigo   . CVA (cerebral vascular accident) ([place])    x2 - L retina, 1 right parietal  . Degenerative arthritis   . Depression   . Diabetes mellitus   . Dyslipidemia   . GERD (gastroesophageal reflux disease)    hiatal hernia  . Gout   . H/O: vasectomy   . Hearing aid worn    b/l  . Hx of appendectomy   . Hx of tonsillectomy   . Hypertension   . 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 Melanoma ([place])   . Obesity   . OSA on CPAP    setting = 5  . Tremor, essential 06/22/2017  . Wears glasses     Patient Active Problem List   Diagnosis Date Noted  . Cerebral embolism with cerebral infarction 08/29/2019  . 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 Hyperlipidemia 08/28/2019  . Chronic kidney disease 08/28/2019  . Obesity (BMI 30-39.9) 08/28/2019  . Tremor 07/07/2019  . Essential tremor 06/22/2017  . OSA on CPAP 04/10/2017  . Essential hypertension 04/10/2017  . Pulmonary thromboembolism ([place]) 04/10/2017  . PE (pulmonary thromboembolism) ([place]) 04/09/2017  . S/P left TKA 03/30/2017  . S/P total knee replacement 03/30/2017  . Cellulitis of left foot 03/07/2016  . Pre-ulcerative calluses  03/07/2016  . Type 2 diabetes mellitus with left diabetic foot ulcer ([place]) 01/14/2016  . Diabetes mellitus type 2 in obese ([place]) 08/07/2008  . GERD 08/07/2008    Past Surgical History:  Procedure Laterality Date  . APPENDECTOMY    . arthroscopic knee surgery Bilateral   . CATARACT EXTRACTION Bilateral   . COLONOSCOPY    . MINOR PLACEMENT OF FIDUCIAL N/A 06/30/2019   Procedure: Fiducial placement;  Surgeon: Erline Levine, MD;  Location: [facility];  Service: Neurosurgery;  Laterality: N/A;  Fiducial placement  . NASAL SEPTUM SURGERY    . PULSE GENERATOR IMPLANT N/A 07/14/2019   Procedure: Left cranial Implanted Pulse Generator and lead extension placement to right chest ;  Surgeon: Erline Levine, MD;  Location: [facility];  Service: Neurosurgery;  Laterality: N/A;  . SUBTHALAMIC STIMULATOR INSERTION Left 07/07/2019   Procedure: LEFT DEEP BRAIN STIMULATOR PLACEMENT;  Surgeon: Erline Levine, MD;  Location: [facility];  Service: Neurosurgery;  Laterality: Left;  . TONSILLECTOMY    . TOTAL KNEE ARTHROPLASTY Left 03/30/2017   Procedure: LEFT TOTAL KNEE ARTHROPLASTY;  Surgeon: Paralee Cancel, MD;  Location: [facility];  Service: Orthopedics;  Laterality: Left;  . WISDOM TOOTH EXTRACTION         Family History  Problem Relation Age of Onset  . Cerebral aneurysm  Mother   . Tremor Mother   . Alzheimer's disease Father   . Tremor Brother   . Tremor Maternal Uncle   . Diabetes Maternal Grandmother   . Healthy Son   . Colon cancer Neg Hx     Social History   Tobacco Use  . Smoking status: Former Smoker    Packs/day: 1.00    Years: 10.00    Pack years: 10.00    Types: Cigarettes    Quit date: 11/25/1983    Years since quitting: 36.0  . Smokeless tobacco: Never Used  Substance Use Topics  . Alcohol use: Yes    Alcohol/week: 2.0 standard drinks    Types: 2 Standard drinks or equivalent per week    Comment: occassionally  . Drug use: No    Home Medications Prior to Admission medications   Medication  Sig Start Date End Date Taking? Authorizing Provider  amLODipine (NORVASC) 5 MG tablet Take 5 mg by mouth at bedtime.  09/28/18   [provider]  apixaban (ELIQUIS) 5 MG TABS tablet Take 1 tablet (5 mg total) by mouth 2 (two) times daily. 11/08/19   Donato Heinz, MD  atorvastatin (LIPITOR) 80 MG tablet Take 1 tablet (80 mg total) by mouth daily at 6 PM. 08/31/19   Danford, Suann Larry, MD  DULoxetine (CYMBALTA) 60 MG capsule Take 60 mg by mouth daily.    [provider]  fenofibrate 54 MG tablet Take 54 mg by mouth daily.    [provider]  hydrochlorothiazide (HYDRODIURIL) 25 MG tablet Take 25 mg by mouth daily.    [provider]  Insulin Glargine, 1 Unit Dial, (TOUJEO SOLOSTAR) 300 UNIT/ML SOPN Inject 85 Units into the skin 2 (two) times daily.    [provider]  insulin lispro (HUMALOG KWIKPEN) 100 UNIT/ML KwikPen Inject 15-25 Units into the skin See admin instructions. Inject 15 units subcutaneously after breakfast and 25 units after lunch and supper    [provider]  losartan (COZAAR) 100 MG tablet Take 100 mg by mouth daily.    [provider]  Lutein-Zeaxanthin 25-5 MG CAPS Take 1 tablet by mouth daily.    [provider]  metFORMIN (GLUCOPHAGE) 1000 MG tablet Take 1,000 mg by mouth 2 (two) times daily with a meal.    [provider]  Multiple Vitamin (MULTIVITAMIN WITH MINERALS) TABS tablet Take 1 tablet by mouth daily.    [provider]  Omega-3 Fatty Acids (FISH OIL) 1000 MG CAPS Take 2,000 mg by mouth daily. Reported on 02/13/2016    [provider]  [facility] VERIO test strip  09/13/18   [provider]  Polyethyl Glycol-Propyl Glycol (SYSTANE OP) Place 1-2 drops into both eyes 4 (four) times daily as needed (dry eyes).     [provider]  PRESCRIPTION MEDICATION Inhale into the lungs at bedtime. CPAP    [provider]    Allergies    Patient  has no known allergies.  Review of Systems   Review of Systems All systems reviewed and negative, other than as noted in HPI.  Physical Exam Updated Vital Signs BP (!) 144/90 (BP Location: Left Arm)   Pulse 79   Temp 98.7 F (37.1 C) (Oral)   Resp 14   Ht 6\' 4"  (1.93 m)   Wt 118.4 kg   SpO2 100%   BMI 31.77 kg/m   Physical Exam Vitals and nursing note reviewed.  Constitutional:      General:  He is not in acute distress.    Appearance: He is well-developed.  HENT:     Head: Normocephalic and atraumatic.  Eyes:     General:        Right eye: No discharge.        Left eye: No discharge.     Conjunctiva/sclera: Conjunctivae normal.  Cardiovascular:     Rate and Rhythm: Normal rate and regular rhythm.     Heart sounds: Normal heart sounds. No murmur. No friction rub. No gallop.   Pulmonary:     Effort: Pulmonary effort is normal. No respiratory distress.     Breath sounds: Normal breath sounds.  Abdominal:     General: There is no distension.     Palpations: Abdomen is soft.     Tenderness: There is no abdominal tenderness.  Musculoskeletal:        General: No tenderness.     Cervical back: Neck supple.     Comments: No midline spinal tenderness  Skin:    General: Skin is warm and dry.  Neurological:     Mental Status: He is alert.  Psychiatric:        Behavior: Behavior normal.        Thought Content: Thought content normal.     ED Results / Procedures / Treatments   Labs (all labs ordered are listed, but only abnormal results are displayed) Labs Reviewed  CBC WITH DIFFERENTIAL/PLATELET - Abnormal; Notable for the following components:      Result Value   Hemoglobin 12.8 (*)    All other components within normal limits  BASIC METABOLIC PANEL - Abnormal; Notable for the following components:   Glucose, Bld 220 (*)    BUN 26 (*)    Creatinine, Ser 1.57 (*)    GFR calc non Af Amer 44 (*)    GFR calc Af Amer 51 (*)    All other components within normal  limits  MAGNESIUM    EKG EKG Interpretation  Date/Time:  Thursday December 15 2019 17:53:35 EST Ventricular Rate:  83 PR Interval:  220 QRS Duration: 120 QT Interval:  378 QTC Calculation: 444 R Axis:   -58 Text Interpretation: Sinus rhythm with 1st degree A-V block with occasional Premature ventricular complexes Left anterior fascicular block Minimal voltage criteria for LVH, may be normal variant ( [employer] product ) Possible Anterior infarct , age undetermined Abnormal ECG Confirmed by Davonna Belling [phone]) on 12/16/2019 9:02:27 PM   Radiology No results found.  Procedures Procedures (including critical care time)  Medications Ordered in ED Medications - No data to display  ED Course  I have reviewed the triage vital signs and the nursing notes.  Pertinent labs & imaging results that were available during my care of the patient were reviewed by me and considered in my medical decision making (see chart for details).    MDM Rules/Calculators/A&P                      69 year old male with syncopal event last night.  Concerned about his Eliquis usage in the fall.  Neuro exam is nonfocal.  Imaging without acute abnormality.  Low suspicion for emergent etiology of his syncope or significant injury from the fall. Final Clinical Impression(s) / ED Diagnoses Final diagnoses:  Syncope, unspecified syncope type  On continuous oral anticoagulation    Rx / DC Orders ED Discharge Orders    None       Virgel Manifold, MD 12/21/19  0848  

## 2019-12-27 NOTE — Progress Notes (Signed)
Cardiology Office Note:    Date:  01/07/2020   ID:  Joshua [name], DOB 07-06-1951, MRN [id number]  PCP:  [name], MD  Cardiologist:  No primary care provider on file.  Electrophysiologist:  None   Referring MD: [name], MD   Chief Complaint  Patient presents with  . Cardiomyopathy    History of Present Illness:    Joshua [name] is a 69 y.o. male with a hx of hypertension, diabtes, OSA, PE after knee surgery, essential tremor status post DBS June 2020, CVA who presents for follow-up.  Admitted to [facility] from 08/28/19 through 08/31/19 with an acute CVA.  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  MRI brain showed right internal capsule infarct.  Echocardiogram showed no cardiogenic source of embolism.  He was started on heparin given subclavian artery thrombosis, and this was transitioned to Eliquis.  It was unclear if the thrombosis was due to a cardiac source or developed in situ.  Stroke team recommended Eliquis for 2 months and repeating CTA neck; if CTA neck negative and no atrial fibrillation noted on 30-day monitor, stroke team recommended stopping Eliquis and resuming antiplatelet agent.  TTE was notable for an incidental finding of asymmetric basal septal hypertrophy.  He was referred to cardiology and seen on 09/19/19.  Cardiac MRI was ordered, which showed Basal septal hypertrophy measuring up to 85mm (lateral wall 37mm), consistent with hypertrophic cardiomyopathy.  PYP scan showed no evidence of amyloid.  Cardiac monitor showed no VT, occasional PVCs (1.3% of beats).    He denies any family history of HCM.  Does have vertigo.  No syncope except with CVA years ago.   He reports a recent ED visit.  He reports that he was at a hotel recently and was sitting in a shower chair that broke and he fell and hit his head.  He went to the ED, head CT was negative.  Other than this incident, he states that he has been doing well.  Denies  any chest pain or dyspnea.   Past Medical History:  Diagnosis Date  . Benign essential tremor   . Benign positional vertigo   . CVA (cerebral vascular accident) ([place])    x2 - L retina, 1 right parietal  . Degenerative arthritis   . Depression   . Diabetes mellitus   . Dyslipidemia   . GERD (gastroesophageal reflux disease)    hiatal hernia  . Gout   . H/O: vasectomy   . Hearing aid worn    b/l  . Hx of appendectomy   . Hx of tonsillectomy   . Hypertension   . 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 Melanoma ([place])   . Obesity   . OSA on CPAP    setting = 5  . Tremor, essential 06/22/2017  . Wears glasses     Past Surgical History:  Procedure Laterality Date  . APPENDECTOMY    . arthroscopic knee surgery Bilateral   . CATARACT EXTRACTION Bilateral   . COLONOSCOPY    . MINOR PLACEMENT OF FIDUCIAL N/A 06/30/2019   Procedure: Fiducial placement;  Surgeon: Erline Levine, MD;  Location: [place];  Service: Neurosurgery;  Laterality: N/A;  Fiducial placement  . NASAL SEPTUM SURGERY    . PULSE GENERATOR IMPLANT N/A 07/14/2019   Procedure: Left cranial Implanted Pulse Generator and lead extension placement to right chest ;  Surgeon: Erline Levine, MD;  Location:  [place] [facility];  Service: Neurosurgery;  Laterality: N/A;  . SUBTHALAMIC STIMULATOR INSERTION Left 07/07/2019   Procedure: LEFT DEEP BRAIN STIMULATOR PLACEMENT;  Surgeon: Erline Levine, MD;  Location: [place];  Service: Neurosurgery;  Laterality: Left;  . TONSILLECTOMY    . TOTAL KNEE ARTHROPLASTY Left 03/30/2017   Procedure: LEFT TOTAL KNEE ARTHROPLASTY;  Surgeon: Paralee Cancel, MD;  Location: [facility];  Service: Orthopedics;  Laterality: Left;  . WISDOM TOOTH EXTRACTION      Current Medications: Current Meds  Medication Sig  . amLODipine (NORVASC) 5 MG tablet Take 5 mg by mouth at bedtime.   Marland Kitchen apixaban (ELIQUIS) 5 MG TABS tablet Take 1 tablet (5 mg total) by mouth 2 (two) times daily.  Marland Kitchen atorvastatin (LIPITOR) 80 MG tablet Take 1  tablet (80 mg total) by mouth daily at 6 PM.  . DULoxetine (CYMBALTA) 60 MG capsule Take 60 mg by mouth daily.  . fenofibrate 54 MG tablet Take 54 mg by mouth daily.  . hydrochlorothiazide (HYDRODIURIL) 25 MG tablet Take 25 mg by mouth daily.  . Insulin Glargine, 1 Unit Dial, (TOUJEO SOLOSTAR) 300 UNIT/ML SOPN Inject 85 Units into the skin 2 (two) times daily.  . insulin lispro (HUMALOG KWIKPEN) 100 UNIT/ML KwikPen Inject 15-25 Units into the skin See admin instructions. Inject 15 units subcutaneously after breakfast and 25 units after lunch and supper  . losartan (COZAAR) 100 MG tablet Take 100 mg by mouth daily.  . Lutein-Zeaxanthin 25-5 MG CAPS Take 1 tablet by mouth daily.  . metFORMIN (GLUCOPHAGE) 1000 MG tablet Take 1,000 mg by mouth 2 (two) times daily with a meal.  . Multiple Vitamin (MULTIVITAMIN WITH MINERALS) TABS tablet Take 1 tablet by mouth daily.  . Omega-3 Fatty Acids (FISH OIL) 1000 MG CAPS Take 2,000 mg by mouth daily. Reported on 02/13/2016  . ONETOUCH VERIO test strip   . Polyethyl Glycol-Propyl Glycol (SYSTANE OP) Place 1-2 drops into both eyes 4 (four) times daily as needed (dry eyes).   Marland Kitchen PRESCRIPTION MEDICATION Inhale into the lungs at bedtime. CPAP  . SURE COMFORT PEN NEEDLES 31G X 5 MM MISC      Allergies:   Patient has no known allergies.   Social History   Socioeconomic History  . Marital status: Married    Spouse name: [name]  . Number of children: 2  . Years of education: Bachelors   . Highest education level: Bachelor's degree (e.g., BA, AB, BS)  Occupational History  . Occupation: retired    Fish farm manager: [employer]    Comment: teaching/coaching  Tobacco Use  . Smoking status: Former Smoker    Packs/day: 1.00    Years: 10.00    Pack years: 10.00    Types: Cigarettes    Quit date: 11/25/1983    Years since quitting: 36.1  . Smokeless tobacco: Never Used  Substance and Sexual Activity  . Alcohol use: Yes    Alcohol/week: 2.0 standard drinks      Types: 2 Standard drinks or equivalent per week    Comment: occassionally  . Drug use: No  . Sexual activity: Not on file  Other Topics Concern  . Not on file  Social History Narrative   Patient lives at home with wife. [name](   Patient has 2 children that are in good health.    Patient works for [employer]. Retired .   Patient has a Bachelors degree in History.    Social Determinants of Health   Financial Resource Strain:   .  Difficulty of Paying Living Expenses: Not on file  Food Insecurity:   . Worried About Charity fundraiser in the Last Year: Not on file  . Ran Out of Food in the Last Year: Not on file  Transportation Needs:   . Lack of Transportation (Medical): Not on file  . Lack of Transportation (Non-Medical): Not on file  Physical Activity:   . Days of Exercise per Week: Not on file  . Minutes of Exercise per Session: Not on file  Stress:   . Feeling of Stress : Not on file  Social Connections:   . Frequency of Communication with Friends and Family: Not on file  . Frequency of Social Gatherings with Friends and Family: Not on file  . Attends Religious Services: Not on file  . Active Member of Clubs or Organizations: Not on file  . Attends Archivist Meetings: Not on file  . Marital Status: Not on file     Family History: The patient's family history includes Alzheimer's disease in his father; Cerebral aneurysm in his mother; Diabetes in his maternal grandmother; Healthy in his son; Tremor in his brother, maternal uncle, and mother. There is no history of Colon cancer.  ROS:   Please see the history of present illness.     All other systems reviewed and are negative.  EKGs/Labs/Other Studies Reviewed:    The following studies were reviewed today:   EKG:  EKG is not ordered today.    Cardiac MRI 10/07/19: 1. Limited study, as only a few sequences were able to be completed due to limitations from presence of deep brain  stimulator 2. Basal septal hypertrophy measuring up to 36mm (lateral wall 12 mm), consistent with hypertrophic cardiomyopathy 3. Patchy late gadolinium enhancement in basal septum, consistent with HCM 4. Basal inferolateral midwall LGE, which would not be a typical pattern for HCM, as more commonly seen in setting of prior myocarditis or sarcoidosis. 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  Normal LV size with hyperdynamic systolic function (EF [id number]) 6.  Normal RV size and systolic function (EF [id number])  TTE 08/29/19: 1. Technically difficult study. Left ventricular ejection fraction appears grossly normal, approximately 55-60%, though difficult visualization even with contrast 2. There is asymmetric basal septal hypertrophy measuring 18 mm in basal septum (12 mm posterior wall). Consider cardiac MRI to assess for hypertrophic cardiomyopathy if clinically indicated 3. Definity contrast agent was given IV to delineate the left ventricular endocardial borders. 4. Global right ventricle has normal systolic function.The right ventricular size is normal. No increase in right ventricular wall thickness. 5. There is mild dilatation of the aortic root measuring 39 mm. 6. The inferior vena cava is dilated in size with <50% respiratory variability, suggesting right atrial pressure of 15 mmHg.  PYP scan 10/25/19:  The study is normal.  No evidence of TTR amyloidosis.   Cardiac monitor 11/11/19:  No significant abnormalities  No atrial fibrillation. No VT  Occasional PVCs (1.3% of beats).  1 patient triggered event, which appears to correspond to short pause (1.1 seconds) from a blocked PA   Predominant rhythm is sinus rhythm. Range is 57 to 137 bpm with average of 89 bpm. No atrial fibrillation, sustained ventricular tachycardia, significant pause, or high degree AV block. Occasional PVCs (1.3% of beats). 1 patient triggered events. Triggered  event appears to correspond to short pause (1.1 seconds) from a blocked PAC.  No significant abnormalities.   Recent Labs: 08/28/2019: ALT  16 12/15/2019: BUN 26; Creatinine, Ser 1.57; Hemoglobin 12.8; Magnesium 1.7; Platelets 263; Potassium 4.2; Sodium 139  Recent Lipid Panel    Component Value Date/Time   CHOL 183 08/29/2019 0409   TRIG 249 (H) 08/29/2019 0409   HDL 26 (L) 08/29/2019 0409   CHOLHDL 7.0 08/29/2019 0409   VLDL 50 (H) 08/29/2019 0409   LDLCALC 107 (H) 08/29/2019 0409    Physical Exam:    VS:  BP 138/87   Pulse 87   Temp (!) 97 F (36.1 C)   Ht 6\' 4"  (1.93 m)   Wt 261 lb (118.4 kg)   SpO2 97%   BMI 31.77 kg/m     Wt Readings from Last 3 Encounters:  12/29/19 261 lb (118.4 kg)  12/15/19 261 lb (118.4 kg)  11/28/19 264 lb 2 oz (119.8 kg)     GEN: Well nourished, well developed in no acute distress HEENT: Normal NECK: No JVD LYMPHATICS: No lymphadenopathy CARDIAC: RRR, no murmurs, rubs, gallops RESPIRATORY:  Clear to auscultation without rales, wheezing or rhonchi  ABDOMEN: Soft, non-tender, non-distended MUSCULOSKELETAL:  No edema; No deformity  SKIN: Warm and dry NEUROLOGIC:  Alert and oriented x 3 PSYCHIATRIC:  Normal affect   ASSESSMENT:    1. Hypertrophic cardiomyopathy ([place])   2. Cerebrovascular accident (CVA), unspecified mechanism ([place])   3. Essential hypertension   4. Hyperlipidemia, unspecified hyperlipidemia type   5. PVCs (premature ventricular contractions)    PLAN:    Hypertrophic cardiomyopathy: 58mm (lateral wall 12 mm) on CMR.  In addition, had unusual scar pattern for HCM, with basal inferolateral scar on MRI.  Fabry's disease can be associated with this scar pattern and asymmetric hypertrophy, so alpha galactosidase was checked and was normal. Scar pattern not c/w amyloid on CMR, but unfortunately unable to do T1 mapping to r/o amyloid due to DBS.  No evidence of amyloid on PYP scan.  Scar pattern may represent prior episode of  myocarditis.  Suspect HCM.  No obstruction.  Asymptomatic.  Recommended screening of first-degree relatives with TTE.  CVA: Subclavian artery thrombosis diagnosed, unclear if cardioembolic source or developed in situ.  Currently on Eliquis.  No AF on cardiac monitor.  Hypertension: On amlodipine 5 mg daily, hydrochlorothiazide 25 mg daily, losartan 100 mg daily.  Appears well controlled.  Hyperlipidemia: On atorvastatin 80 mg daily.  LDL 107 on 08/29/2019  Type 2 diabetes: A1c 7.8%.  On insulin  PVCs: occasional (1.3%) on recent monitor.  He is asymptomatic and with normal LV systolic function, no treatment indicated  RTC in 6 months   Medication Adjustments/Labs and Tests Ordered: Current medicines are reviewed at length with the patient today.  Concerns regarding medicines are outlined above.  No orders of the defined types were placed in this encounter.  No orders of the defined types were placed in this encounter.   Patient Instructions  Medication Instructions:  Your physician recommends that you continue on your current medications as directed. Please refer to the Current Medication list given to you today.  *If you need a refill on your cardiac medications before your next appointment, please call your pharmacy*  Lab Work: NONE  Testing/Procedures: NONE  Follow-Up: At [facility], you and your health needs are our priority.  As part of our continuing mission to provide you with exceptional heart care, we have created designated Provider Care Teams.  These Care Teams include your primary Cardiologist (physician) and Advanced Practice Providers (APPs -  Physician Assistants and Nurse Practitioners)  who all work together to provide you with the care you need, when you need it.  Your next appointment:   6 month(s)  The format for your next appointment:   In Person  Provider:   Oswaldo Milian, MD  Other Instructions Hypertrophic cardiomyopathy ---we  recommend your 1st degree relatives get echocardiogram to evaluate    Hypertrophic Cardiomyopathy  Hypertrophic cardiomyopathy (HCM) is a heart condition in which part of the heart muscle gets too thick. The condition can cause a dangerous and abnormal heart rhythm. It can also weaken the heart over time. The heart is divided into four chambers. The thickening usually affects the pumping chamber on the lower left (left ventricle). HCM may also affect the valve that lets blood flow into the left ventricle (mitral valve). Symptoms often begin when a person is about 71 years old. What are the causes? This condition is usually caused by abnormal genes that control heart muscle growth. These abnormal genes are passed down through families (are inherited). What increases the risk? You are more likely to develop this condition if you have a family history of HCM. What are the signs or symptoms? Symptoms of this condition include:  Shortness of breath, especially after exercising or lying down.  Chest pain.  Dizziness.  Fatigue.  Irregular or fast heart rate.  Fainting, especially after physical activity. How is this diagnosed? This condition is diagnosed based on:  A physical exam that involves checking for an abnormal heart sound (heart murmur).  Tests, such as: ? An electrocardiogram (ECG). This is a test that records your heart's electrical activity. ? An echocardiogram. This test can show whether your left ventricle is enlarged and whether it fills slowly. ? An exercise stress test. This test is done to collect information about how your heart functions during exercise. ? A Doppler test. This is a test that shows irregular blood flow and pressure differences inside the heart. ? A chest X-ray to see whether your heart is enlarged. ? An MRI. ? Genetic testing done on a blood sample. How is this treated? Treatment for HCM depends on how severe your symptoms are. There are several  options for treatment, including:  Medicine. Medicines can be given to: ? Reduce the workload of your heart. ? Lower your blood pressure. ? Thin your blood and prevent clots.  A device. Devices that can be used to treat this condition include: ? A pacemaker. This device helps to control your heartbeat. ? A defibrillator. This device restores a normal heart rhythm.  Surgery. This may include a procedure to: ? Inject alcohol into the small blood vessels that supply your heart muscle (alcohol septal ablation).This is done during a procedure called cardiac catheterization. The goal is to cause the muscle to become thinner. ? Remove part of the wall that divides the right and left sides of the heart (septum) with a procedure called surgical myectomy. ? Replace the mitral valve. Follow these instructions at home: Activity  Avoid strenuous exercise and activities, such as shoveling snow. Do not participate in high intensity activities, such as competitive sports.  Get regular physical activity. Most patients can participate in low to moderate intensity exercise such as walking. Ask your health care provider what activity level is safe for you.  Do not lift anything that is heavier than 10 lb (4.5 kg), or the limit that you are told, until your health care provider says that it is safe. Lifestyle  Maintain a healthy weight. If you need  help with losing weight, ask your health care provider.  Eat a heart-healthy diet.  Do not drink alcohol if: ? Your health care provider tells you not to drink. ? You are pregnant, may be pregnant, or are planning to become pregnant.  If you drink alcohol: ? Limit how much you use to:  0-1 drink a day for women.  0-2 drinks a day for men. ? Be aware of how much alcohol is in your drink. In the [place] one drink equals one 12 oz bottle of beer (355 mL), one 5 oz glass of wine (148 mL), or one 1 oz glass of hard liquor (44 mL).  Do not use any products  that contain nicotine or tobacco, such as cigarettes, e-cigarettes, and chewing tobacco. If you need help quitting, ask your health care provider. General instructions  Make sure the members of your household know how to do CPR in case of an emergency.  Take over-the-counter and prescription medicines only as told by your health care provider.  Keep all follow-up visits as told by your health care provider. This is important. Contact a health care provider if:  You have new symptoms.  Your symptoms get worse. Get help right away if:  You have chest pain or shortness of breath, especially during or after sports.  You feel faint or you pass out.  You have trouble breathing even at rest.  Your feet or ankles swell.  Your heartbeat seems irregular or seems faster than normal (palpitations). These symptoms may represent a serious problem that is an emergency. Do not wait to see if the symptoms will go away. Get medical help right away. Call your local emergency services (911 in the [place] Do not drive yourself to the hospital. Summary  Hypertrophic cardiomyopathy (HCM) is a heart condition in which part of the heart muscle gets too thick.  The condition can cause a dangerous and abnormal heart rhythm, and it can weaken the heart over time.  Avoid strenuous exercise and activities, such as shoveling snow.  Make sure the members of your household know how to do CPR in case of an emergency.  Keep all follow-up visits as told by your health care provider. This is important. This information is not intended to replace advice given to you by your health care provider. Make sure you discuss any questions you have with your health care provider. Document Revised: 08/03/2018 Document Reviewed: 08/03/2018 Elsevier Patient Education  2020 [place], Donato Heinz, MD  01/07/2020 4:05 PM    [facility]

## 2019-12-29 ENCOUNTER — Other Ambulatory Visit: Payer: Self-pay

## 2019-12-29 ENCOUNTER — Ambulatory Visit (INDEPENDENT_AMBULATORY_CARE_PROVIDER_SITE_OTHER): Payer: No Typology Code available for payment source | Admitting: Cardiology

## 2019-12-29 ENCOUNTER — Encounter: Payer: Self-pay | Admitting: Cardiology

## 2019-12-29 VITALS — BP 138/87 | HR 87 | Temp 97.0°F | Ht 76.0 in | Wt 261.0 lb

## 2019-12-29 DIAGNOSIS — I639 Cerebral infarction, unspecified: Secondary | ICD-10-CM

## 2019-12-29 DIAGNOSIS — I10 Essential (primary) hypertension: Secondary | ICD-10-CM | POA: Diagnosis not present

## 2019-12-29 DIAGNOSIS — E785 Hyperlipidemia, unspecified: Secondary | ICD-10-CM

## 2019-12-29 DIAGNOSIS — I422 Other hypertrophic cardiomyopathy: Secondary | ICD-10-CM

## 2019-12-29 DIAGNOSIS — I493 Ventricular premature depolarization: Secondary | ICD-10-CM

## 2019-12-29 NOTE — Patient Instructions (Signed)
Medication Instructions:  Your physician recommends that you continue on your current medications as directed. Please refer to the Current Medication list given to you today.  *If you need a refill on your cardiac medications before your next appointment, please call your pharmacy*  Lab Work: NONE  Testing/Procedures: NONE  Follow-Up: At [facility], you and your health needs are our priority.  As part of our continuing mission to provide you with exceptional heart care, we have created designated Provider Care Teams.  These Care Teams include your primary Cardiologist (physician) and Advanced Practice Providers (APPs -  Physician Assistants and Nurse Practitioners) who all work together to provide you with the care you need, when you need it.  Your next appointment:   6 month(s)  The format for your next appointment:   In Person  Provider:   Oswaldo Milian, MD  Other Instructions Hypertrophic cardiomyopathy ---we recommend your 1st degree relatives get echocardiogram to evaluate    Hypertrophic Cardiomyopathy  Hypertrophic cardiomyopathy (HCM) is a heart condition in which part of the heart muscle gets too thick. The condition can cause a dangerous and abnormal heart rhythm. It can also weaken the heart over time. The heart is divided into four chambers. The thickening usually affects the pumping chamber on the lower left (left ventricle). HCM may also affect the valve that lets blood flow into the left ventricle (mitral valve). Symptoms often begin when a person is about 71 years old. What are the causes? This condition is usually caused by abnormal genes that control heart muscle growth. These abnormal genes are passed down through families (are inherited). What increases the risk? You are more likely to develop this condition if you have a family history of HCM. What are the signs or symptoms? Symptoms of this condition include:  Shortness of breath, especially  after exercising or lying down.  Chest pain.  Dizziness.  Fatigue.  Irregular or fast heart rate.  Fainting, especially after physical activity. How is this diagnosed? This condition is diagnosed based on:  A physical exam that involves checking for an abnormal heart sound (heart murmur).  Tests, such as: ? An electrocardiogram (ECG). This is a test that records your heart's electrical activity. ? An echocardiogram. This test can show whether your left ventricle is enlarged and whether it fills slowly. ? An exercise stress test. This test is done to collect information about how your heart functions during exercise. ? A Doppler test. This is a test that shows irregular blood flow and pressure differences inside the heart. ? A chest X-ray to see whether your heart is enlarged. ? An MRI. ? Genetic testing done on a blood sample. How is this treated? Treatment for HCM depends on how severe your symptoms are. There are several options for treatment, including:  Medicine. Medicines can be given to: ? Reduce the workload of your heart. ? Lower your blood pressure. ? Thin your blood and prevent clots.  A device. Devices that can be used to treat this condition include: ? A pacemaker. This device helps to control your heartbeat. ? A defibrillator. This device restores a normal heart rhythm.  Surgery. This may include a procedure to: ? Inject alcohol into the small blood vessels that supply your heart muscle (alcohol septal ablation).This is done during a procedure called cardiac catheterization. The goal is to cause the muscle to become thinner. ? Remove part of the wall that divides the right and left sides of the heart (septum) with a  procedure called surgical myectomy. ? Replace the mitral valve. Follow these instructions at home: Activity  Avoid strenuous exercise and activities, such as shoveling snow. Do not participate in high intensity activities, such as competitive  sports.  Get regular physical activity. Most patients can participate in low to moderate intensity exercise such as walking. Ask your health care provider what activity level is safe for you.  Do not lift anything that is heavier than 10 lb (4.5 kg), or the limit that you are told, until your health care provider says that it is safe. Lifestyle  Maintain a healthy weight. If you need help with losing weight, ask your health care provider.  Eat a heart-healthy diet.  Do not drink alcohol if: ? Your health care provider tells you not to drink. ? You are pregnant, may be pregnant, or are planning to become pregnant.  If you drink alcohol: ? Limit how much you use to:  0-1 drink a day for women.  0-2 drinks a day for men. ? Be aware of how much alcohol is in your drink. In the [place] one drink equals one 12 oz bottle of beer (355 mL), one 5 oz glass of wine (148 mL), or one 1 oz glass of hard liquor (44 mL).  Do not use any products that contain nicotine or tobacco, such as cigarettes, e-cigarettes, and chewing tobacco. If you need help quitting, ask your health care provider. General instructions  Make sure the members of your household know how to do CPR in case of an emergency.  Take over-the-counter and prescription medicines only as told by your health care provider.  Keep all follow-up visits as told by your health care provider. This is important. Contact a health care provider if:  You have new symptoms.  Your symptoms get worse. Get help right away if:  You have chest pain or shortness of breath, especially during or after sports.  You feel faint or you pass out.  You have trouble breathing even at rest.  Your feet or ankles swell.  Your heartbeat seems irregular or seems faster than normal (palpitations). These symptoms may represent a serious problem that is an emergency. Do not wait to see if the symptoms will go away. Get medical help right away. Call your  local emergency services (911 in the [place] Do not drive yourself to the hospital. Summary  Hypertrophic cardiomyopathy (HCM) is a heart condition in which part of the heart muscle gets too thick.  The condition can cause a dangerous and abnormal heart rhythm, and it can weaken the heart over time.  Avoid strenuous exercise and activities, such as shoveling snow.  Make sure the members of your household know how to do CPR in case of an emergency.  Keep all follow-up visits as told by your health care provider. This is important. This information is not intended to replace advice given to you by your health care provider. Make sure you discuss any questions you have with your health care provider. Document Revised: 08/03/2018 Document Reviewed: 08/03/2018 Elsevier Patient Education  [place].

## 2020-01-11 ENCOUNTER — Encounter: Payer: Self-pay | Admitting: *Deleted

## 2020-01-11 DIAGNOSIS — Z006 Encounter for examination for normal comparison and control in clinical research program: Secondary | ICD-10-CM

## 2020-01-11 NOTE — Research (Signed)
Marland KitchenMarland KitchenMarland KitchenMarland KitchenMarland KitchenMarland KitchenMarland Kitchen   COORDINATE-Diabetes 3 Month Patient Questionnaire (Intervention) Site #:   [id number]   Patient ID:                   3 MONTH QUESTIONNAIRE  Visit Date 01/11/20   Vital Status [x]   Patient Alive > Proceed to Visit Status []   Patient Dead > Complete Death Form only []   Unknown > Proceed to Visit Status  Visit Status Was the interview completed? []  No >IF NO, Select reason why [] Unable to locate                                    [] No Valid Contacts (patients or alternates) [] Multiple attempts to valid contacts []  Patient no longer cared for at study clinic []  Patient withdrew []  Other, specify:                                           >IF NO, Last date of contact: / /             MM      DD        YYYY    [] Yes >IF YES, Select source of Interview: []   Proxy []   Patient     3 Month Patient Questionnaire (Intervention)  Instructions:   1. Prior to speaking with the patient either over the phone or in person, print off or have available the patient's current list of medications.      IF conducting follow-up visit over the phone - Instruct the patient to gather all their pill bottles. 2. For the purpose of the COORDINATE-Diabetes Study, please document whether the patient is currently taking each of the following medication classes. 3. DO NOT PROMPT DURING FOLLOW-UP VISIT: IF subject mentions reaction to one of the following medications, complete the AE/SAE section and follow instructions in operations manual regarding Adverse event reporting to [employer] ([facility]).  MEDICATIONS  Medication Are you currently taking [insert name of previous med]? If currently taking: If started since last visit: If stopped since last visit:  ACE Inhibitor / Angiotensin Receptor Blocker (ARB) / Angiotensin Receptor Neprilysin inhibitor (ARNi) [] No >  [x] Yes > [] Stopped since last visit [] Never prescribed [] Started since last visit [x] Same medication as last     visit [] Different  medication than       last visit [Study personnel to answer]  Documented in EHR? [] No  [x] Yes   If no, Verified by: [] Photo of bottle [] Copy of rx [] Dispensing       pharmacy [] Prescribing provider [] Other (specify):                    Date started:        / /             MM DD YYYY Who prescribed this medication for you? [] Cardiology provider > [] Study clinic [] Outside clinic [] Endocrinology provider [] Primary care provider [] Other provider > Specify:                             [] Unknown Date discontinued:        / /             MM DD YYYY Why did you stop taking this medication? (  check all that apply) [] Allergic reaction [] Medication side effects [] Unable to       adhere/monitor [] Had an      operation/procedure      that required      stopping it [] Unable to afford it [] No longer wants        to take        this medication [] Provider decision [] Pregnancy [] Other (specify: ) [] Unknown Reason   If started or changed  What medication are you taking now? [] Benazepril (Lotensin) [] Captopril (Capoten) [] Enalapril (Vasotec) [] Fosinopril (Monopril) [] Lisinopril (Zestril, Prinivil) [] Quinapril (Accupril) [] Ramipril (Altace) [] Azilsartan (Edarbi) [] Candesartan (Atacand) [] Irbesartan (Avapro) [] Losartan (Cozaar) [] Olmesartan (Benicar) [] Telmisartan (Micardis) [] Valsartan (Diovan) [] Sacrubitril/Valsartan Delene Loll)      Medication Are you currently taking [insert name of previous med]? If currently taking: If started since last visit: If stopped since last visit:  Statin [x]  No >  [] Yes > [] Stopped since last visit [] Never prescribed [] Started since last visit [x] Same medication as last        visit [] Different medication or dose     than last visit [Study personnel to answer]  Documented in EHR? [] No [x] Yes    If no, Verified by: [] Photo of bottle [] Copy of rx [] Dispensing pharmacy [] Prescribing provider [] Other (specify):                     Date started:        / /             MM DD YYYY Who prescribed this medication for you? [] Cardiology provider  [] Study clinic [] Outside clinic [] Endocrinology provider [] Primary care provider [] Other provider  Specify:                             [] Unknown Date discontinued:        / /             MM DD YYYY Why did you stop taking this medication? (check all that apply) [] Allergic reaction [] Medication side effects  [] Muscle aches [] Weakness [] Joint pain [] Cognitive symptoms [] Other (specify):                          [] Unable to        adhere/monitor [] Had an    operation/procedure    that required stopping it [] Unable to afford it [] No longer wants       to take this            medication [] Provider decision [] Pregnancy [] Other (specify: ) [] Unknown Reason   If started or changed  What medication are you taking now? [] Atorvastatin (Lipitor) [] Fluvastatin (Lescol) [] Lovastatin (Mevacor) [] Pravastatin (Pravachol) [] Rosuvastatin (Crestor) [] Simvastatin (Zocor) [] Pitatavastatin (Livalo)  Dose: [] 1 mg  [] 10 mg [] 2 mg  []  20 mg [] 3 mg  []  40 mg [] 4 mg  []  60 mg [] 5 mg  []  80 mg  Frequency: [] Daily []  Less than daily      Medication Are you currently taking [insert name of previous med]? If currently taking: If started since last visit: If stopped since last visit:  SGLT2 Inhibitor [x] No   [] Yes [] Stopped since last visit [] Never prescribed [] Started since last visit [] Same medication as last      visit [] Different medication than last visit [Study personnel to answer]  Documented in EHR? [] No [] Yes   If no, Verified by: [] Photo of bottle [] Copy of rx [] Dispensing  pharmacy [] Prescribing       provider [] Other (specify):                    Date started:        / /             MM DD YYYY Who prescribed this medication for you? [] Cardiology provider  [] Study clinic [] Outside clinic [] Endocrinology  provider [] Primary care provider [] Other provider  Specify:                             [] Unknown Date discontinued:        / /             MM DD YYYY Why did you stop taking this medication? (check all that apply) [] Allergic reaction [] Medication side effects [] Unable to       adhere/monitor [] Had an        operation/procedure         that required           stopping it [] Patient unable to       afford it [] Patient no longer       wants to       take this medication [] Provider decision [] Pregnancy [] Other (specify: ) [] Unknown Reason   If started or changed  What medication are you taking now? [] Canaglifozin (Invokana) [] Dapagliflozin (Farxiga) [] Empaglifozin (Jardiance) [] Ertugliflozin (Steglatro)     GLP1 Receptor Agonist [x] No   [] Yes [] Stopped since last visit [] Never prescribed [] Started since last visit [] Same medication as last      visit [] Different medication than       last visit [Study personnel to answer]  Documented in EHR? [] No [] Yes   If no, Verified by: [] Photo of bottle [] Copy of rx [] Dispensing       pharmacy [] Prescribing       provider [] Other (specify):                    Date started:        / /             MM DD YYYY Who prescribed this medication for you? [] Cardiology provider  [] Study clinic [] Outside clinic [] Endocrinology provider [] Primary care provider [] Other provider  Specify:                             [] Unknown Date discontinued:        / /             MM DD YYYY Why did you stop taking this medication? (check all that apply) [] Allergic reaction [] Medication side effects [] Unable to      adhere/monitor [] Had an    operation/procedure    that required stopping it [] Patient unable to        afford it [] Patient no longer        wants to take this        medication [] Provider decision [] Pregnancy [] Other (specify: ) [] Unknown Reason   If started or changed  What medication are you taking  now? [] Albiglutide (Tanzeum) [] Dulaglutide (Trulicity) [] Exanatide (Byetta, Bydureon) [] Liraglutide (Victoza, Saxenda) [] Lixisenatide (Adlyxin) [] Semaglutice (Ozempic)     02/01/2019 ([facility] Release)

## 2020-01-16 ENCOUNTER — Telehealth: Payer: Self-pay | Admitting: Neurology

## 2020-01-16 NOTE — Telephone Encounter (Signed)
Patient called with concerns he has misplaced his DBS charger. He'd like to know if we have one in the office he can use?   Patient has reached out to [employer] for a replacement. But, in the meantime, he needs a charge.  Patient stated cannot think about anything else but getting a DBS charge right now.

## 2020-01-16 NOTE — Telephone Encounter (Signed)
Patient returned call and is now aware to call [phone] so he can get a replacement charger. Patient said he last charged the DBS device on 01/09/20. He is aware the charge will last several weeks, per Dr. Carles Collet.   Patient expressed appreciation.

## 2020-01-16 NOTE — Telephone Encounter (Signed)
Left detailed message with instructions from Dr Tat.   " he needs to call our patient services at [phone] and follow the prompts for currently implanted system.  If its his first lost item, we will usually replace for free. After that, it will be the patient's responsibility.   Also informed patient to contact office and let me know the last time he charged his DBS.

## 2020-01-24 ENCOUNTER — Ambulatory Visit: Payer: Medicare Other | Admitting: Cardiology

## 2020-02-23 DIAGNOSIS — M25552 Pain in left hip: Secondary | ICD-10-CM | POA: Diagnosis not present

## 2020-02-23 DIAGNOSIS — M9905 Segmental and somatic dysfunction of pelvic region: Secondary | ICD-10-CM | POA: Diagnosis not present

## 2020-02-23 DIAGNOSIS — M5136 Other intervertebral disc degeneration, lumbar region: Secondary | ICD-10-CM | POA: Diagnosis not present

## 2020-02-23 DIAGNOSIS — M9903 Segmental and somatic dysfunction of lumbar region: Secondary | ICD-10-CM | POA: Diagnosis not present

## 2020-02-27 DIAGNOSIS — M5136 Other intervertebral disc degeneration, lumbar region: Secondary | ICD-10-CM | POA: Diagnosis not present

## 2020-02-27 DIAGNOSIS — M9903 Segmental and somatic dysfunction of lumbar region: Secondary | ICD-10-CM | POA: Diagnosis not present

## 2020-02-27 DIAGNOSIS — M25552 Pain in left hip: Secondary | ICD-10-CM | POA: Diagnosis not present

## 2020-02-27 DIAGNOSIS — M9905 Segmental and somatic dysfunction of pelvic region: Secondary | ICD-10-CM | POA: Diagnosis not present

## 2020-02-28 DIAGNOSIS — M9905 Segmental and somatic dysfunction of pelvic region: Secondary | ICD-10-CM | POA: Diagnosis not present

## 2020-02-28 DIAGNOSIS — I129 Hypertensive chronic kidney disease with stage 1 through stage 4 chronic kidney disease, or unspecified chronic kidney disease: Secondary | ICD-10-CM | POA: Diagnosis not present

## 2020-02-28 DIAGNOSIS — E1129 Type 2 diabetes mellitus with other diabetic kidney complication: Secondary | ICD-10-CM | POA: Diagnosis not present

## 2020-02-28 DIAGNOSIS — M9903 Segmental and somatic dysfunction of lumbar region: Secondary | ICD-10-CM | POA: Diagnosis not present

## 2020-02-28 DIAGNOSIS — N1831 Chronic kidney disease, stage 3a: Secondary | ICD-10-CM | POA: Diagnosis not present

## 2020-02-28 DIAGNOSIS — M5136 Other intervertebral disc degeneration, lumbar region: Secondary | ICD-10-CM | POA: Diagnosis not present

## 2020-02-28 DIAGNOSIS — Z794 Long term (current) use of insulin: Secondary | ICD-10-CM | POA: Diagnosis not present

## 2020-02-28 DIAGNOSIS — M25552 Pain in left hip: Secondary | ICD-10-CM | POA: Diagnosis not present

## 2020-03-01 DIAGNOSIS — M9905 Segmental and somatic dysfunction of pelvic region: Secondary | ICD-10-CM | POA: Diagnosis not present

## 2020-03-01 DIAGNOSIS — M9903 Segmental and somatic dysfunction of lumbar region: Secondary | ICD-10-CM | POA: Diagnosis not present

## 2020-03-01 DIAGNOSIS — M5136 Other intervertebral disc degeneration, lumbar region: Secondary | ICD-10-CM | POA: Diagnosis not present

## 2020-03-01 DIAGNOSIS — M25552 Pain in left hip: Secondary | ICD-10-CM | POA: Diagnosis not present

## 2020-03-05 DIAGNOSIS — M25552 Pain in left hip: Secondary | ICD-10-CM | POA: Diagnosis not present

## 2020-03-05 DIAGNOSIS — M9903 Segmental and somatic dysfunction of lumbar region: Secondary | ICD-10-CM | POA: Diagnosis not present

## 2020-03-05 DIAGNOSIS — M9905 Segmental and somatic dysfunction of pelvic region: Secondary | ICD-10-CM | POA: Diagnosis not present

## 2020-03-05 DIAGNOSIS — M5136 Other intervertebral disc degeneration, lumbar region: Secondary | ICD-10-CM | POA: Diagnosis not present

## 2020-03-08 DIAGNOSIS — M5136 Other intervertebral disc degeneration, lumbar region: Secondary | ICD-10-CM | POA: Diagnosis not present

## 2020-03-08 DIAGNOSIS — M25552 Pain in left hip: Secondary | ICD-10-CM | POA: Diagnosis not present

## 2020-03-08 DIAGNOSIS — M9905 Segmental and somatic dysfunction of pelvic region: Secondary | ICD-10-CM | POA: Diagnosis not present

## 2020-03-08 DIAGNOSIS — M9903 Segmental and somatic dysfunction of lumbar region: Secondary | ICD-10-CM | POA: Diagnosis not present

## 2020-03-15 DIAGNOSIS — M9905 Segmental and somatic dysfunction of pelvic region: Secondary | ICD-10-CM | POA: Diagnosis not present

## 2020-03-15 DIAGNOSIS — M25552 Pain in left hip: Secondary | ICD-10-CM | POA: Diagnosis not present

## 2020-03-15 DIAGNOSIS — M9903 Segmental and somatic dysfunction of lumbar region: Secondary | ICD-10-CM | POA: Diagnosis not present

## 2020-03-15 DIAGNOSIS — M5136 Other intervertebral disc degeneration, lumbar region: Secondary | ICD-10-CM | POA: Diagnosis not present

## 2020-03-16 ENCOUNTER — Encounter: Payer: Self-pay | Admitting: *Deleted

## 2020-03-16 DIAGNOSIS — Z006 Encounter for examination for normal comparison and control in clinical research program: Secondary | ICD-10-CM

## 2020-03-21 DIAGNOSIS — M25552 Pain in left hip: Secondary | ICD-10-CM | POA: Diagnosis not present

## 2020-03-21 DIAGNOSIS — M9903 Segmental and somatic dysfunction of lumbar region: Secondary | ICD-10-CM | POA: Diagnosis not present

## 2020-03-21 DIAGNOSIS — M5136 Other intervertebral disc degeneration, lumbar region: Secondary | ICD-10-CM | POA: Diagnosis not present

## 2020-03-21 DIAGNOSIS — M9905 Segmental and somatic dysfunction of pelvic region: Secondary | ICD-10-CM | POA: Diagnosis not present

## 2020-03-23 ENCOUNTER — Telehealth: Payer: Self-pay | Admitting: Neurology

## 2020-03-23 NOTE — Telephone Encounter (Signed)
Patient states his machine is on. Offered Monday at 2 pm but patient states his wife starts chemotherapy and he prefers another day. Message sent to front to schedule patient an appointment.

## 2020-03-23 NOTE — Telephone Encounter (Signed)
What makes him think this?  DBS generally does not just quit working.

## 2020-03-23 NOTE — Telephone Encounter (Signed)
Patient states his right hand is shaking like crazy. If he grabs a spoon or cup of coffee his hand shakes like crazy. He wants to know if he needs an appt?

## 2020-03-23 NOTE — Telephone Encounter (Signed)
Pt called and thinks he needs adjustment for his DBS  Please call and talk with patient to see if the patient needs to be seen or what he can do

## 2020-03-23 NOTE — Telephone Encounter (Signed)
Make sure he has checked the device to ensure it is on and working properly If device is on, can transfer to [facility] and make DBS follow up appt.  Hinton Dyer, ok for NP slot for DBS appt

## 2020-03-23 NOTE — Telephone Encounter (Signed)
Left message for patient to contact office.

## 2020-03-26 ENCOUNTER — Encounter: Payer: Non-veteran care | Admitting: Neurology

## 2020-03-27 DIAGNOSIS — R4701 Aphasia: Secondary | ICD-10-CM | POA: Diagnosis not present

## 2020-03-27 DIAGNOSIS — D229 Melanocytic nevi, unspecified: Secondary | ICD-10-CM | POA: Insufficient documentation

## 2020-03-27 DIAGNOSIS — I748 Embolism and thrombosis of other arteries: Secondary | ICD-10-CM | POA: Diagnosis not present

## 2020-03-27 DIAGNOSIS — H9190 Unspecified hearing loss, unspecified ear: Secondary | ICD-10-CM | POA: Insufficient documentation

## 2020-03-27 DIAGNOSIS — I700 Atherosclerosis of aorta: Secondary | ICD-10-CM | POA: Diagnosis not present

## 2020-03-27 DIAGNOSIS — Z794 Long term (current) use of insulin: Secondary | ICD-10-CM | POA: Diagnosis not present

## 2020-03-27 DIAGNOSIS — I6990 Unspecified sequelae of unspecified cerebrovascular disease: Secondary | ICD-10-CM | POA: Diagnosis not present

## 2020-03-27 DIAGNOSIS — E1129 Type 2 diabetes mellitus with other diabetic kidney complication: Secondary | ICD-10-CM | POA: Diagnosis not present

## 2020-03-27 DIAGNOSIS — Z978 Presence of other specified devices: Secondary | ICD-10-CM | POA: Diagnosis not present

## 2020-03-27 DIAGNOSIS — I517 Cardiomegaly: Secondary | ICD-10-CM | POA: Diagnosis not present

## 2020-04-03 DIAGNOSIS — M25552 Pain in left hip: Secondary | ICD-10-CM | POA: Diagnosis not present

## 2020-04-03 DIAGNOSIS — M9903 Segmental and somatic dysfunction of lumbar region: Secondary | ICD-10-CM | POA: Diagnosis not present

## 2020-04-03 DIAGNOSIS — M9905 Segmental and somatic dysfunction of pelvic region: Secondary | ICD-10-CM | POA: Diagnosis not present

## 2020-04-03 DIAGNOSIS — M5136 Other intervertebral disc degeneration, lumbar region: Secondary | ICD-10-CM | POA: Diagnosis not present

## 2020-04-03 NOTE — Progress Notes (Deleted)
Assessment/Plan:   1.  Essential Tremor.  -The patient is status post left VIM DBS on July 07, 2019.  Intention was originally to do a bilateral VIM DBS, but the patient had a vagal episode in the operating room with associated nausea and vomiting with positive CSF, and it was decided not to attempt the other side.  Ultimately, the patients father side really has been fairly well controlled and we have decided to leave him with unilateral DBS.  2.  History of cerebral infarct, October, 2020  -MRI demonstrated acute right posterior limb of the internal capsule infarct. However, stroke neurology felt that symptoms were more consistent with a posterior circulation stroke, related to thrombus in the left subclavian(although patient certainly does have mild left facial droop today).   -Patient does continue to have subclavian thrombus on December 21, 2019 CTA neck.  Patient on Eliquis.  -Patient on Lipitor.  Primary care following.  Lipitor 80 mg (40 mg at time of event).  Goal LDL less than 70.  -Discussed importance of diet.  Goal for his A1c is less than 7.  3.  Dysphagia  -Modified barium on December 06, 2019 was normal.  4.  Hypertrophic cardiomyopathy  -Following with cardiology.  On Eliquis.  Subjective:   Joshua [name] was seen in consultation in the movement disorder clinic for follow-up of essential tremor.  Patient asked to be working as he thought tremor was getting worse.  Patient does have 2 different programs on his DBS.  Reports that he has not changed programs to see if it would help tremor.  He has had a CTA of the neck since last visit.  Subclavian thrombus persists.  Patient is on Eliquis.  He had a swallow study on January 12, which was normal.  Patient was in the emergency room on December 15, 2019.  Those records have been reviewed.  Patient got up to use the bathroom in the middle of the night when he fell.  Primary care recommended that he go when because he was on  Eliquis.  ER records indicate that it was a syncopal episode, but patient reports that he was sitting in a chair shower chair that broke and he fell and hit his head.  He did follow-up with cardiology on February 4.  No Known Allergies  Current Outpatient Medications  Medication Instructions  . amLODipine (NORVASC) 5 mg, Oral, Daily at bedtime  . apixaban (ELIQUIS) 5 mg, Oral, 2 times daily  . atorvastatin (LIPITOR) 80 mg, Oral, Daily-1800  . DULoxetine (CYMBALTA) 60 mg, Oral, Daily  . fenofibrate 54 mg, Oral, Daily  . Fish Oil 2,000 mg, Oral, Daily, Reported on 02/13/2016  . hydrochlorothiazide (HYDRODIURIL) 25 mg, Oral, Daily  . insulin lispro (HUMALOG KWIKPEN) 15-25 Units, Subcutaneous, See admin instructions, Inject 15 units subcutaneously after breakfast and 25 units after lunch and supper  . losartan (COZAAR) 100 mg, Oral, Daily  . Lutein-Zeaxanthin 25-5 MG CAPS 1 tablet, Oral, Daily  . metFORMIN (GLUCOPHAGE) 1,000 mg, Oral, 2 times daily with meals  . Multiple Vitamin (MULTIVITAMIN WITH MINERALS) TABS tablet 1 tablet, Oral, Daily  . ONETOUCH VERIO test strip No dose, route, or frequency recorded.  Vladimir Faster Glycol-Propyl Glycol (SYSTANE OP) 1-2 drops, Both Eyes, 4 times daily PRN  . PRESCRIPTION MEDICATION Inhalation, Daily at bedtime, CPAP  . SURE COMFORT PEN NEEDLES 31G X 5 MM MISC No dose, route, or frequency recorded.  Nelva Nay SoloStar 85 Units, Subcutaneous, 2 times daily  Objective:   VITALS:  There were no vitals filed for this visit. Gen:  Appears stated age and in NAD. HEENT:  Normocephalic, atraumatic. The mucous membranes are moist. The superficial temporal arteries are without ropiness or tenderness. Cardiovascular: Regular rate and rhythm. Lungs: Clear to auscultation bilaterally. Neck: There are no carotid bruits noted bilaterally.  NEUROLOGICAL:  Orientation:  The patient is alert and oriented x 3.   Cranial nerves: There is good facial symmetry.  Extraocular muscles are intact and visual fields are full to confrontational testing. Speech is fluent and clear. Soft palate rises symmetrically and there is no tongue deviation. Hearing is intact to conversational tone. Tone: Tone is good throughout. Sensation: Sensation is intact to light touch touch throughout (facial, trunk, extremities). Vibration is intact at the bilateral big toe. There is no extinction with double simultaneous stimulation. There is no sensory dermatomal level identified. Coordination:  The patient has no dysdiadichokinesia or dysmetria. Motor: Strength is 5/5 in the bilateral upper and lower extremities.  Shoulder shrug is equal bilaterally.  There is no pronator drift.  There are no fasciculations noted. DTR's: Deep tendon reflexes are 2/4 at the bilateral biceps, triceps, brachioradialis, patella and achilles.  Plantar responses are downgoing bilaterally. Gait and Station: The patient is able to ambulate without difficulty. The patient is able to heel toe walk without any difficulty. The patient is able to ambulate in a tandem fashion. The patient is able to stand in the Romberg position.   MOVEMENT EXAM: Tremor:  There is *** tremor in the UE, noted most significantly with action.  The patient is *** able to draw Archimedes spirals without significant difficulty.  There is *** tremor at rest.  The patient is *** able to pour water from one glass to another without spilling it.  I have reviewed and interpreted the following labs independently   Chemistry      Component Value Date/Time   NA 139 12/15/2019 1815   K 4.2 12/15/2019 1815   CL 105 12/15/2019 1815   CO2 25 12/15/2019 1815   BUN 26 (H) 12/15/2019 1815   CREATININE 1.57 (H) 12/15/2019 1815      Component Value Date/Time   CALCIUM 9.0 12/15/2019 1815   ALKPHOS 81 08/28/2019 1300   AST 22 08/28/2019 1300   ALT 16 08/28/2019 1300   BILITOT 0.5 08/28/2019 1300      Lab Results  Component Value Date    WBC 9.2 12/15/2019   HGB 12.8 (L) 12/15/2019   HCT 39.8 12/15/2019   MCV 92.3 12/15/2019   PLT 263 12/15/2019   Lab Results  Component Value Date   TSH 4.207 04/11/2017      Total time spent on today's visit was ***60 minutes, including both face-to-face time and nonface-to-face time.  Time included that spent on review of records (prior notes available to me/labs/imaging if pertinent), discussing treatment and goals, answering patient's questions and coordinating care.  CC:  [name], MD

## 2020-04-05 ENCOUNTER — Encounter: Payer: Non-veteran care | Admitting: Neurology

## 2020-04-10 DIAGNOSIS — M5136 Other intervertebral disc degeneration, lumbar region: Secondary | ICD-10-CM | POA: Diagnosis not present

## 2020-04-10 DIAGNOSIS — M9903 Segmental and somatic dysfunction of lumbar region: Secondary | ICD-10-CM | POA: Diagnosis not present

## 2020-04-10 DIAGNOSIS — M25552 Pain in left hip: Secondary | ICD-10-CM | POA: Diagnosis not present

## 2020-04-10 DIAGNOSIS — M9905 Segmental and somatic dysfunction of pelvic region: Secondary | ICD-10-CM | POA: Diagnosis not present

## 2020-04-15 ENCOUNTER — Other Ambulatory Visit: Payer: Self-pay

## 2020-04-15 ENCOUNTER — Emergency Department (HOSPITAL_COMMUNITY)
Admission: EM | Admit: 2020-04-15 | Discharge: 2020-04-15 | Disposition: A | Discharge status: Home / Self Care | Payer: No Typology Code available for payment source | Attending: Emergency Medicine | Admitting: Emergency Medicine

## 2020-04-15 DIAGNOSIS — H9202 Otalgia, left ear: Secondary | ICD-10-CM | POA: Diagnosis not present

## 2020-04-15 DIAGNOSIS — Y92012 Bathroom of single-family (private) house as the place of occurrence of the external cause: Secondary | ICD-10-CM | POA: Diagnosis not present

## 2020-04-15 DIAGNOSIS — S09302A Unspecified injury of left middle and inner ear, initial encounter: Secondary | ICD-10-CM | POA: Diagnosis present

## 2020-04-15 DIAGNOSIS — Y998 Other external cause status: Secondary | ICD-10-CM | POA: Diagnosis not present

## 2020-04-15 DIAGNOSIS — E1140 Type 2 diabetes mellitus with diabetic neuropathy, unspecified: Secondary | ICD-10-CM | POA: Diagnosis not present

## 2020-04-15 DIAGNOSIS — Z7901 Long term (current) use of anticoagulants: Secondary | ICD-10-CM | POA: Diagnosis not present

## 2020-04-15 DIAGNOSIS — I10 Essential (primary) hypertension: Secondary | ICD-10-CM | POA: Diagnosis not present

## 2020-04-15 DIAGNOSIS — S00412A Abrasion of left ear, initial encounter: Secondary | ICD-10-CM | POA: Diagnosis not present

## 2020-04-15 DIAGNOSIS — Z794 Long term (current) use of insulin: Secondary | ICD-10-CM | POA: Insufficient documentation

## 2020-04-15 DIAGNOSIS — Y93E8 Activity, other personal hygiene: Secondary | ICD-10-CM | POA: Diagnosis not present

## 2020-04-15 DIAGNOSIS — Z79899 Other long term (current) drug therapy: Secondary | ICD-10-CM | POA: Insufficient documentation

## 2020-04-15 DIAGNOSIS — Z87891 Personal history of nicotine dependence: Secondary | ICD-10-CM | POA: Diagnosis not present

## 2020-04-15 DIAGNOSIS — Z96652 Presence of left artificial knee joint: Secondary | ICD-10-CM | POA: Diagnosis not present

## 2020-04-15 DIAGNOSIS — X58XXXA Exposure to other specified factors, initial encounter: Secondary | ICD-10-CM | POA: Diagnosis not present

## 2020-04-15 NOTE — ED Provider Notes (Signed)
[facility] DEPT Provider Note   CSN: [id number] Arrival date & time: 04/15/20  [id number]     History Chief Complaint  Patient presents with  . Otalgia    blood in ear    Joshua [name] is a 69 y.o. male.  He is complaining of blood in his left ear canal when he used a Q-tip this morning.  He received 2 new hearing aids 4 days ago.  He is not having any pain with them.  He noticed he heard a little decreased hearing this morning and some pain so he used a Q-tip.  He is on blood thinners.  No other complaints  The history is provided by the patient.  Otalgia Location:  Left Behind ear:  No abnormality Quality:  Dull Severity:  Mild Onset quality:  Sudden Timing:  Intermittent Progression:  Unchanged Chronicity:  New Relieved by:  Nothing Worsened by:  Nothing Ineffective treatments:  None tried Associated symptoms: ear discharge (blood)   Associated symptoms: no abdominal pain, no cough, no fever, no headaches, no rhinorrhea and no sore throat        Past Medical History:  Diagnosis Date  . Benign essential tremor   . Benign positional vertigo   . CVA (cerebral vascular accident) ([place])    x2 - L retina, 1 right parietal  . Degenerative arthritis   . Depression   . Diabetes mellitus   . Dyslipidemia   . GERD (gastroesophageal reflux disease)    hiatal hernia  . Gout   . H/O: vasectomy   . Hearing aid worn    b/l  . Hx of appendectomy   . Hx of tonsillectomy   . Hypertension   . 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 Melanoma ([place])   . Obesity   . OSA on CPAP    setting = 5  . Tremor, essential 06/22/2017  . Wears glasses     Patient Active Problem List   Diagnosis Date Noted  . Cerebral embolism with cerebral infarction 08/29/2019  . 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 Hyperlipidemia 08/28/2019  . Chronic kidney disease 08/28/2019  . Obesity (BMI 30-39.9) 08/28/2019  . Tremor 07/07/2019  . Essential  tremor 06/22/2017  . OSA on CPAP 04/10/2017  . Essential hypertension 04/10/2017  . Pulmonary thromboembolism ([place]) 04/10/2017  . PE (pulmonary thromboembolism) ([place]) 04/09/2017  . S/P left TKA 03/30/2017  . S/P total knee replacement 03/30/2017  . Cellulitis of left foot 03/07/2016  . Pre-ulcerative calluses 03/07/2016  . Type 2 diabetes mellitus with left diabetic foot ulcer ([place]) 01/14/2016  . Diabetes mellitus type 2 in obese ([place]) 08/07/2008  . GERD 08/07/2008    Past Surgical History:  Procedure Laterality Date  . APPENDECTOMY    . arthroscopic knee surgery Bilateral   . CATARACT EXTRACTION Bilateral   . COLONOSCOPY    . MINOR PLACEMENT OF FIDUCIAL N/A 06/30/2019   Procedure: Fiducial placement;  Surgeon: Erline Levine, MD;  Location: [facility];  Service: Neurosurgery;  Laterality: N/A;  Fiducial placement  . NASAL SEPTUM SURGERY    . PULSE GENERATOR IMPLANT N/A 07/14/2019   Procedure: Left cranial Implanted Pulse Generator and lead extension placement to right chest ;  Surgeon: Erline Levine, MD;  Location: [facility];  Service: Neurosurgery;  Laterality: N/A;  . SUBTHALAMIC STIMULATOR INSERTION Left 07/07/2019   Procedure: LEFT DEEP BRAIN STIMULATOR PLACEMENT;  Surgeon: Erline Levine, MD;  Location: [facility];  Service: Neurosurgery;  Laterality: Left;  . TONSILLECTOMY    . TOTAL KNEE ARTHROPLASTY Left 03/30/2017   Procedure: LEFT TOTAL KNEE ARTHROPLASTY;  Surgeon: Paralee Cancel, MD;  Location: [facility];  Service: Orthopedics;  Laterality: Left;  . WISDOM TOOTH EXTRACTION         Family History  Problem Relation Age of Onset  . Cerebral aneurysm Mother   . Tremor Mother   . Alzheimer's disease Father   . Tremor Brother   . Tremor Maternal Uncle   . Diabetes Maternal Grandmother   . Healthy Son   . Colon cancer Neg Hx     Social History   Tobacco Use  . Smoking status: Former Smoker    Packs/day: 1.00    Years: 10.00    Pack years: 10.00    Types: Cigarettes    Quit date:  11/25/1983    Years since quitting: 36.4  . Smokeless tobacco: Never Used  Substance Use Topics  . Alcohol use: Yes    Alcohol/week: 2.0 standard drinks    Types: 2 Standard drinks or equivalent per week    Comment: occassionally  . Drug use: No    Home Medications Prior to Admission medications   Medication Sig Start Date End Date Taking? Authorizing Provider  amLODipine (NORVASC) 5 MG tablet Take 5 mg by mouth at bedtime.  09/28/18   [provider]  apixaban (ELIQUIS) 5 MG TABS tablet Take 1 tablet (5 mg total) by mouth 2 (two) times daily. 11/08/19   Donato Heinz, MD  atorvastatin (LIPITOR) 80 MG tablet Take 1 tablet (80 mg total) by mouth daily at 6 PM. 08/31/19   Danford, Suann Larry, MD  DULoxetine (CYMBALTA) 60 MG capsule Take 60 mg by mouth daily.    [provider]  fenofibrate 54 MG tablet Take 54 mg by mouth daily.    [provider]  hydrochlorothiazide (HYDRODIURIL) 25 MG tablet Take 25 mg by mouth daily.    [provider]  Insulin Glargine, 1 Unit Dial, (TOUJEO SOLOSTAR) 300 UNIT/ML SOPN Inject 85 Units into the skin 2 (two) times daily.    [provider]  insulin lispro (HUMALOG KWIKPEN) 100 UNIT/ML KwikPen Inject 15-25 Units into the skin See admin instructions. Inject 15 units subcutaneously after breakfast and 25 units after lunch and supper    [provider]  losartan (COZAAR) 100 MG tablet Take 100 mg by mouth daily.    [provider]  Lutein-Zeaxanthin 25-5 MG CAPS Take 1 tablet by mouth daily.    [provider]  metFORMIN (GLUCOPHAGE) 1000 MG tablet Take 1,000 mg by mouth 2 (two) times daily with a meal.    [provider]  Multiple Vitamin (MULTIVITAMIN WITH MINERALS) TABS tablet Take 1 tablet by mouth daily.    [provider]  Omega-3 Fatty Acids (FISH OIL) 1000 MG CAPS Take 2,000 mg by mouth daily. Reported on 02/13/2016    [provider]  [facility]  VERIO test strip  09/13/18   [provider]  Polyethyl Glycol-Propyl Glycol (SYSTANE OP) Place 1-2 drops into both eyes 4 (four) times daily as needed (dry eyes).     [provider]  PRESCRIPTION MEDICATION Inhale into the lungs at bedtime. CPAP    [provider]  SURE COMFORT PEN NEEDLES 31G X 5 MM [place]  09/12/19   [provider]    Allergies    Patient has no known allergies.  Review of Systems   Review of Systems  Constitutional: Negative for fever.  HENT: Positive for ear discharge (blood) and ear pain. Negative for nosebleeds, rhinorrhea and sore throat.   Respiratory: Negative for cough.   Gastrointestinal: Negative for abdominal pain.  Neurological: Negative for headaches.    Physical Exam Updated Vital Signs BP 122/86 (BP Location: Left Arm)   Pulse 62   Temp 97.8 F (36.6 C) (Oral)   Resp 17   Ht 6\' 4"  (1.93 m)   Wt 116.1 kg   SpO2 99%   BMI 31.16 kg/m   Physical Exam Vitals and nursing note reviewed.  Constitutional:      Appearance: He is well-developed.  HENT:     Head: Normocephalic and atraumatic.     Right Ear: Ear canal normal. No hemotympanum.     Left Ear: No hemotympanum.     Ears:     Comments: He has some abrasions on the wall of the left ear canal.  No active bleeding.  Question whether his new hearing aid may have caused some trauma.  Both TMs are abnormal appearing but similar.  Appears to be some calcification of the TM. Eyes:     Conjunctiva/sclera: Conjunctivae normal.  Pulmonary:     Effort: Pulmonary effort is normal.  Musculoskeletal:     Cervical back: Neck supple.  Skin:    General: Skin is warm and dry.  Neurological:     Mental Status: He is alert.     GCS: GCS eye subscore is 4. GCS verbal subscore is 5. GCS motor subscore is 6.     ED Results / Procedures / Treatments   Labs (all labs ordered are listed, but only abnormal results are displayed) Labs Reviewed - No data to  display  EKG None  Radiology No results found.  Procedures Procedures (including critical care time)  Medications Ordered in ED Medications - No data to display  ED Course  I have reviewed the triage vital signs and the nursing notes.  Pertinent labs & imaging results that were available during my care of the patient were reviewed by me and considered in my medical decision making (see chart for details).  Clinical Course as of Apr 15 1701  Sun Apr 15, 2020  0858 Patient states the [place] is sent him to an audiologist at [facility].  He said he could reach out to her and see her tomorrow and if he needs a referral to an ENT they would be able to take care of that.  He is reassured that I am not seeing any serious findings.  Return instructions discussed.   [MB]    Clinical Course User Index [MB] Hayden Rasmussen, MD   MDM Rules/Calculators/A&P                     Differential includes ear canal trauma, hemotympanum, ear perforation  Final Clinical Impression(s) / ED Diagnoses Final diagnoses:  Otalgia of left ear  Abrasion of left ear canal, initial encounter    Rx / DC Orders ED Discharge Orders    None       Hayden Rasmussen, MD 04/15/20 1702

## 2020-04-15 NOTE — ED Triage Notes (Signed)
Patient reports this morning he woke up and had pain in left ear, patient states it sounded like he was talking in a drum. Patient put q tip in his ear and says blood came out. Patient notes he got his new hearing aides this week.

## 2020-04-15 NOTE — Discharge Instructions (Addendum)
You were seen in the emergency department for evaluation of pain and bleeding from your left ear.  You had some signs of abrasion in the canal on the left side.  There was no active bleeding.  This may be related to your new hearing aids.  Please contact your audiologist for close follow-up.  Return if any worsening or concerning symptoms

## 2020-04-24 NOTE — Progress Notes (Signed)
Assessment/Plan:   1.  Essential Tremor.  -The patient is status post left VIM DBS on July 07, 2019.  Intention was originally to do a bilateral VIM DBS, but the patient had a vagal episode in the operating room with associated nausea and vomiting with positive CSF, and it was decided not to attempt the other side.  Ultimately, the patients other side really has been fairly well controlled and we have decided to leave him with unilateral DBS.  2.  History of cerebral infarct, October, 2020  -MRI demonstrated acute right posterior limb of the internal capsule infarct. However, stroke neurology felt that symptoms were more consistent with a posterior circulation stroke, related to thrombus in the left subclavian(although patient certainly does have mild left facial droop today).   -Patient does continue to have subclavian thrombus on December 21, 2019 CTA neck.  Patient on Eliquis.  -Patient on Lipitor.  Primary care following.  Lipitor 80 mg (40 mg at time of event).  Goal LDL less than 70.  -Discussed importance of diet.  Goal for his A1c is less than 7.  3.  Dysphagia  -Modified barium on December 06, 2019 was normal.  4.  Hypertrophic cardiomyopathy  -Following with cardiology.  On Eliquis.  5.  Falls  -declines PT  -will let me know if changes mind  -DBS adjustments today may help.  Subjective:   Joshua [name] was seen in consultation in the movement disorder clinic for follow-up of essential tremor.  Patient asked to be worked in as he thought tremor was getting worse (although he actually cancelled a few work in appts now but still seen earlier than originally scheduled).  Patient does have 2 different programs on his DBS.  Reports that he has not changed programs to see if it would help tremor.  He has had a CTA of the neck since last visit.  Subclavian thrombus persists.  Patient is on Eliquis.  He had a swallow study on January 12, which was normal.  Patient was in the  emergency room on December 15, 2019.  Those records have been reviewed.  Patient got up to use the bathroom in the middle of the night when he fell.  Primary care recommended that he go when because he was on Eliquis.  ER records indicate that it was a syncopal episode, but patient reports that he was sitting in a chair shower chair that broke and he fell and hit his head (pt states today that the chair broke after he fell).  He did follow-up with cardiology on February 4.  Pt does state that he has had several falls since our last visit.  With one he was trying to get out of the truck and he fell.  States that he has learned that he has learned he has to do slow/deliberate movements.  Last fall was about 5 weeks ago.  He is noting tremor in the R hand with eating (not with holding it out straight).  He has tried to max/increase out the amplitude to help with tremor but he has not changed his program group.  No Known Allergies  Current Outpatient Medications  Medication Instructions  . amLODipine (NORVASC) 5 mg, Oral, Daily at bedtime  . apixaban (ELIQUIS) 5 mg, Oral, 2 times daily  . atorvastatin (LIPITOR) 80 mg, Oral, Daily-1800  . DULoxetine (CYMBALTA) 60 mg, Oral, Daily  . fenofibrate 54 mg, Oral, Daily  . Fish Oil 2,000 mg, Oral, Daily, Reported on 02/13/2016  .  hydrochlorothiazide (HYDRODIURIL) 25 mg, Oral, Daily  . insulin lispro (HUMALOG KWIKPEN) 15-25 Units, Subcutaneous, See admin instructions, Inject 15 units subcutaneously after breakfast and 25 units after lunch and supper  . losartan (COZAAR) 100 mg, Oral, Daily  . Lutein-Zeaxanthin 25-5 MG CAPS 1 tablet, Oral, Daily  . metFORMIN (GLUCOPHAGE) 1,000 mg, Oral, 2 times daily with meals  . Multiple Vitamin (MULTIVITAMIN WITH MINERALS) TABS tablet 1 tablet, Oral, Daily  . ONETOUCH VERIO test strip No dose, route, or frequency recorded.  Vladimir Faster Glycol-Propyl Glycol (SYSTANE OP) 1-2 drops, Both Eyes, 4 times daily PRN  . PRESCRIPTION  MEDICATION Inhalation, Daily at bedtime, CPAP  . SURE COMFORT PEN NEEDLES 31G X 5 MM MISC No dose, route, or frequency recorded.  Nelva Nay SoloStar 85 Units, Subcutaneous, 2 times daily     Objective:   VITALS:   Vitals:   04/25/20 0846  BP: (!) 146/84  Pulse: 84  SpO2: 98%  Weight: 254 lb (115.2 kg)  Height: 6\' 4"  (1.93 m)   Gen:  Appears stated age and in NAD. HEENT:  Normocephalic, atraumatic. The mucous membranes are moist.    NEUROLOGICAL:  Orientation:  The patient is alert and oriented x 3.   Cranial nerves: There is good facial symmetry. Extraocular muscles are intact and visual fields are full to confrontational testing. Speech is fluent and mildly dysarthric. Soft palate rises symmetrically and there is no tongue deviation. Hearing is intact to conversational tone. Tone: Tone is good throughout. Sensation: Sensation is intact to light touch touch throughout  Coordination:  The patient has no dysdiadichokinesia or dysmetria. Motor: Strength is 5/5 in the bilateral upper and lower extremities.  Shoulder shrug is equal bilaterally.   Gait and Station: The patient is able to ambulate without difficulty.   MOVEMENT EXAM: Tremor: Prior to programming, there is mild postural tremor on the left (no DBS on that side).  There is no postural tremor on the right.  When given an object to hold, he has some mild to moderate tremor when it approximates his mouth.  Following programming, this is much improved.  I have reviewed and interpreted the following labs independently   Chemistry      Component Value Date/Time   NA 139 12/15/2019 1815   K 4.2 12/15/2019 1815   CL 105 12/15/2019 1815   CO2 25 12/15/2019 1815   BUN 26 (H) 12/15/2019 1815   CREATININE 1.57 (H) 12/15/2019 1815      Component Value Date/Time   CALCIUM 9.0 12/15/2019 1815   ALKPHOS 81 08/28/2019 1300   AST 22 08/28/2019 1300   ALT 16 08/28/2019 1300   BILITOT 0.5 08/28/2019 1300      Lab Results   Component Value Date   WBC 9.2 12/15/2019   HGB 12.8 (L) 12/15/2019   HCT 39.8 12/15/2019   MCV 92.3 12/15/2019   PLT 263 12/15/2019   Lab Results  Component Value Date   TSH 4.207 04/11/2017      Total time spent on today's visit was 20 minutes, including both face-to-face time and nonface-to-face time.  Time included that spent on review of records (prior notes available to me/labs/imaging if pertinent), discussing treatment and goals, answering patient's questions and coordinating care.  This did not include DBS time, which is described in more detail on separate programming procedural note.  CC:  [name], MD

## 2020-04-25 ENCOUNTER — Other Ambulatory Visit: Payer: Self-pay

## 2020-04-25 ENCOUNTER — Encounter: Payer: Self-pay | Admitting: Neurology

## 2020-04-25 ENCOUNTER — Ambulatory Visit (INDEPENDENT_AMBULATORY_CARE_PROVIDER_SITE_OTHER): Payer: Medicare PPO | Admitting: Neurology

## 2020-04-25 VITALS — BP 146/84 | HR 84 | Ht 76.0 in | Wt 254.0 lb

## 2020-04-25 DIAGNOSIS — G250 Essential tremor: Secondary | ICD-10-CM

## 2020-04-25 DIAGNOSIS — Z9689 Presence of other specified functional implants: Secondary | ICD-10-CM

## 2020-04-25 DIAGNOSIS — W19XXXA Unspecified fall, initial encounter: Secondary | ICD-10-CM

## 2020-04-25 NOTE — Procedures (Signed)
DBS Programming was performed.    Manufacturer of DBS device: [employer] was performed.    Manufacturer of DBS device: [employer]  Total time spent programming was 40 minutes.  Device was confirmed to be on.  Soft start was confirmed to be on.  Impedences were checked and were within normal limits.  Battery was checked and was determined to be functioning normally and not near the end of life.  Final settings were as follows, with program 2 being active:   Active Contacts Amplitude (mA) PW (ms) Frequency (hz)   Left Brain       Program 1       11/28/2019 5-C+ 2.5 60 130   04/25/20 5-C+ 2.7 60 130                 Program 2       11/28/19 5-8+ 2.2 60 130   04/25/20 5-8+ 3.2 60 130   Right Brain       Not active                      Pt shown how to change between programs and demonstrated efficiency with this.  Prior visits:  Monopolar review: Left brain electrode:     1-C+           ; Amplitude  1.0   ma   ; Pulse width 60 microseconds;   Frequency   130   Hz.  (Tongue paresthesias) Left brain electrode:     (2-3-4-) 33% each C+           ; Amplitude  2.0   ma   ; Pulse width 60 microseconds;   Frequency   130   Hz.  (Tongue and lip paresthesias, fairly good tremor control) Left brain electrode:     (5-6-7-) 33% C+           ; Amplitude  2.0   ma   ; Pulse width 60 microseconds;   Frequency   130   Hz.  (Tongue paresthesias but no lip paresthesia and good tremor control) Left brain electrode:     8-C+           ; Amplitude  1.0   ma   ; Pulse width 60 microseconds;   Frequency   130   Hz.  (Paresthesias resolved)

## 2020-04-25 NOTE — Patient Instructions (Signed)
Good to see you today!  The physicians and staff at [facility] are committed to providing excellent care. You may receive a survey requesting feedback about your experience at our office. We strive to receive "very good" responses to the survey questions. If you feel that your experience would prevent you from giving the office a "very good " response, please contact our office to try to remedy the situation. We may be reached at [phone]. Thank you for taking the time out of your busy day to complete the survey.     

## 2020-05-03 DIAGNOSIS — H40013 Open angle with borderline findings, low risk, bilateral: Secondary | ICD-10-CM | POA: Diagnosis not present

## 2020-05-03 DIAGNOSIS — H04123 Dry eye syndrome of bilateral lacrimal glands: Secondary | ICD-10-CM | POA: Diagnosis not present

## 2020-05-03 DIAGNOSIS — H353121 Nonexudative age-related macular degeneration, left eye, early dry stage: Secondary | ICD-10-CM | POA: Diagnosis not present

## 2020-05-03 DIAGNOSIS — E119 Type 2 diabetes mellitus without complications: Secondary | ICD-10-CM | POA: Diagnosis not present

## 2020-05-03 NOTE — Research (Signed)
Late entry   COORDINATE-Diabetes 6 Month CASE REPORT FORM (Intervention) -EHR Site #:   [id number]              Patient ID:           [id number]   [place] Record Check Date [id number]  Vital Status _0 Patient Alive >Date last known alive per EHR:   _1 Patient Dead >> Complete Death Form  _2 Unknown   CLINICAL EVENTS / PROCEDURES  Hospitalization since last visit? (>=24 hour stay) _3 No _4 Yes  >> if yes, Complete the following  Date of hospital admission:        / /             MM DD YYYY  Primary discharge diagnosis:  *Complete appropriate event validation form _5 acute myocardial infarction (heart attack)* _6 stroke* _7 heart failure* _8 coronary revascularization* _9 peripheral revascularization* _10 cerebral revascularization* _11 diabetes (e.g. hypoglycemia, DKA) _12 renal failure _13 amputation _14 other cardiovascular reason _15 other NON-cardiovascular reason _16 unknown  Other diagnoses not documented above: (check all that apply)  *Complete appropriate event validation form _17 acute myocardial infarction (heart attack)* _18 stroke* _19 heart failure* _20 coronary revascularization* _21 peripheral revascularization* _22 cerebral revascularization* _23 diabetes (e.g. hypoglycemia, DKA) _24 renal failure _25 amputation _26 other cardiovascular reason _27 other NON-cardiovascular reason _28 unknown  Were any of the following outpatient procedures done since the last visit? (I.e. procedures not captured above)  Coronary revascularization _29 No _30 Yes >IF YES, Date / /             MM DD YYYY  Peripheral revascularization _31 No _32 Yes > IF YES, Date / /             MM DD YYYY  Cerebral revascularization _33 No _34 Yes > IF YES, Date / /             MM DD YYYY  Extremity amputation    _35 No _36 Yes >IF YES, Date / /             MM      DD       YYYY  Renal replacement therapy (i.e. dialysis)    _37 No _38 Yes > IF YES, Date of initiation / /             MM      DD       YYYY   **EDC will allow for  collection of multiple hospitalizations and procedures   MEDICATIONS  Medication Currently Prescribed? If started since last visit: If not started since last visit: If stopped since last visit:  Cardiac Medications  ACE Inhibitor / Angiotensin Receptor Blocker (ARB) / Angiotensin Receptor Neprilysin inhibitor (ARNi)  _39 No >  _40 Yes > Since last visit, medication was: _41 Stopped _42 Not started _43 Started _44 Continued same medication _45 Continued with medication changes Date started:        / /             MM DD YYYY Who prescribed? _46 Cardiology provider  _47 Study clinic _48 Outside clinic _49 Endocrinology provider _50 Primary care provider _51 Other provider  Specify:                             _52 Unknown Reason (check all that apply): _53 History of swelling around lips, eyes or face _54 Feeling dizzy/lightheaded _55 Low blood pressure _56 Poor or fluctuating kidney function _57 High potassium _58 Patient has experienced other side effects to this medication  before _59 Patient will be unable to adhere/monitor _60 Patient unable to afford it _61 Patient does not want to      take this medication _62 Pregnancy _63 Other (specify: ) _64   Unknown Reason Date discontinued:        / /             MM DD YYYY Reason (check all that apply): _0 Swelling around lips, eyes       or face _1 Feeling dizzy/lightheaded _2 Low blood pressure _3 Poor or fluctuating kidney function _4 High potassium _5 Other medication side       effects _6 Patient unable to        adhere/monitor _7 Had an       operation/procedure that        required stopping it _8 Patient unable to afford it _9 Patient no longer wants       to take this medication _10 Pregnancy _11 Other (specify: ) _12 Unknown Reason   If started or changed  Medication Name: _13 Benazepril (Lotensin) _14 Captopril (Capoten) _15 Enalapril (Vasotec) _16 Fosinopril (Monopril) _17 Lisinopril (Zestril, Prinivil) _18 Quinapril (Accupril) _19 Ramipril (Altace) _20 Azilsartan  (Edarbi) _21 Candesartan (Atacand) _22 Irbesartan (Avapro) _23 Losartan (Cozaar) _24 Olmesartan (Benicar) _25 Telmisartan (Micardis) _26 Valsartan (Diovan) _27 Sacrubitril/Valsartan (Entresto)     Beta Blocker _28 No _29  Yes > _30 Acebutolol (Sectral) _31 Bisoprolol (Zebeta) _32 Carvedilol (Coreg) _33 Labetalol (Trandate,      Normodyne) _34 Metoprolol succinate (Toprol) _35 Metoprolol tartrate       (Lopressor) _36 Nadolol (Corgard) _37 Nebivolol (Bystolic) <ZOXWRUEAVWUJWJXB>_1<\/YNWGNFAOZHYQMVHQ>_46 Propranolol (Inderal) _39 Sotalol (Betapace)     Medication Currently Prescribed? If started since last visit: If not started since last visit: If stopped since last visit:  Aldosterone Antagonist _40 No _41 Yes > _42 Amiloride _43 Eplerenone (Inspra) _44 Spirinolactone (Aldactone) _45 Traimterene (Dyrenium)   Calcium Channel Blocker _46 No _47  Yes > Medication Name: _48 Amlodipine (Norvasc) _49 Diltiazem (Cardizem) _50 Felodipine (Plendil) _51 Nifedipine (Procardia) _52 Verapamil (Calan)   Diuretic Loop _53 No _54  Yes > Medication Name: _55 Bumetanide (Bumex) _56 Ethacrynic acid (Edecrin) _57 Furosemide (Lasix) _58 Torsemide (Demadex)   Diuretic Thiazide- type _59 No _60  Yes > Medication Name: _61 Chlorothiazide      _62 Chlorthalidone _63 Hydrochlorothiazide _64 Indapamide _65 Metolazone   Anticoagulation Therapy (other than Warfarin) _66 No _67  Yes > Medication Name: _68 Apixaban (Eliquis) _69 Edoxaban (Lixiana) _70 Rivaroxaban Alen Blew) _71 Dabigatran (Redaxa)   Warfarin _72 No _73  Yes    Antiplatelet Agent (including aspirin) _74 No _75  Yes > Medication Name (check all that apply): _76 Aspirin _77 Clopidogrel (Plavix) _78 Prasugrel (Effient) _79 Ticagrelor (Brilinta) _80 Ticlopidine (Ticlid) _81 Dipyridamole (Persantine)    Medication Currently Prescribed? If started since last visit: If not started since last visit: If stopped since last visit:  Statin  _82 No >  _83 Yes > Since last visit, medication was: _84 Stopped _85 Not started _86 Started _87 Continued same  medication and dose _88 Continued with dose or medication changes Date started:        / /             MM DD YYYY Who prescribed? _89 Cardiology provider _90 Endocrinology provider _91 Primary care provider _92 Other provider  Specify:                             _93 Unknown Reason (check all that apply): _94 History of Rhabdomyolysis _95 LDL-cholesterol already       <70 _96 Muscle      aches/pain/weakness _97 Mental       fogginess/memory loss _98 Liver dysfunction _99 Patient has  experienced other side   effects to this medication before _100 Patient will be unable to adhere/monitor _101 Patient unable to afford it _102 Patient does not want to take this medication _103 Pregnancy _104 Other (specify: ) _105 Unknown Reason Date discontinued:        / /             MM DD YYYY Reason (check all that apply): _106 Rhabdomyolysis _107 Muscle aches/pain/weakness _108 Mental fogginess/memory loss _109 Liver dysfunction _110 Other medication side effects _111 Patient unable to  adhere/monitor _0 Patient unable to afford it _1 Patient no longer wants to take       this medication _2 Pregnancy _3 Other (specify: ) _4 Unknown Reason   If started or changed  Medication Name: _5 Atorvastatin (Lipitor) _6 Fluvastatin (Lescol) _7 Lovastatin (Mevacor) _8 Pravastatin (Pravachol) _9 Rosuvastatin (Crestor) _10 Simvastatin (Zocor) _11 Pitatavastatin (Livalo)  Dose: _12 1 mg _13 10 mg _14 2 mg _15  20 mg _16 3 mg _17  40 mg _18 4 mg _19  60 mg _20 5 mg _21  80 mg  Frequency: _22 Daily  _23 Less than daily      Does the patient have statin intolerance that prevents the use of maximum dose of high potency statin? _24 No _25 Yes >IF YES, Complete Statin Intolerance form   Non-statin lipid lowering therapy _26 No _27 Yes > Medication Name (check all that apply): _28 Colesevelam (Welchol) _29 Ezetimibe (Zetia) _30 Fibrate _31 Niacin _32 PCSK9 inhibitor _33 Omega 3 acid ethyl esters (Lovaza) _34 Icosapent Ethyl (Vascepa) _35 Over the counter omega 3 fatty acid or fish  oil supplement     Medication Currently Prescribed? If started since last visit: If not started since last visit: If stopped since last visit:  Diabetes Medications  SGLT2 Inhibitor  _36 No >  _37 Yes > Since last visit, medication was: _38 Stopped _39 Not started _40 Started _41 Continued same medication _42 Continued with medication changes Date started:        / /             MM DD YYYY Who prescribed? _43 Cardiology provider  _44 Study clinic _45 Outside clinic _46 Endocrinology      provider _47 Primary care provider _48 Other provider  Specify:                             _49 Unknown Reason (check all that apply): _50 eGFR <45 _51 HbA1c<7% on metformin monotherapy OR already on GLP1RA and do not need to start another anti- hyperglycemic _52 Already dehydrated _53 Low blood pressure _54 High risk of Hypoglycemia _55 Prior DKA _56 Recurrent mycotic genital infections _57 History of or at risk for amputation _58 Patient has experienced other side effects to this medication before _59 Patient will be unable to adhere/monitor _60 Patient unable to afford it _61 Patient does not want to take this medication _62 Pregnancy _63 Other (specify: ) _64 Unknown Reason Date discontinued:        / /             MM DD YYYY Reason (check all that apply  _65 eGFR now <45 _66 Dehydration _67 Low blood pressure _68 Hypoglycemia _69 DKA _70 Mycotic genital infection _71 Amputation _72 Other medication side effects _73 Patient unable    to adhere/monitor  _74 Had an operation/procedure     that required stopping it _75 Patient unable to afford it _76 Patient no longer wants to      take this medication _77 Pregnancy _78 Other (specify: ) _79 Unknown Reason   If started or changed  Medication Name: _80 Canaglifozin (Invokana) _81 Dapagliflozin Wilder Glade) _82 Empaglifozin (Jardiance) _83 Ertugliflozin Actuary)           Medication Currently Prescribed? If started since last visit: If not started since last visit: If stopped since last visit:   GLP1 Receptor Agonist  _84 No >  _85 Yes > Since last visit, medication was: _86 Stopped _87 Not started _88 Started _89 Continued same medication _90 Continued with medication changes Date started:        / /             MM DD YYYY Who prescribed? _91 Cardiology provider  _92 Study clinic _93 Outside clinic _94 Endocrinology       provider _95 Primary care provider _96 Other provider > Specify:                             _97   Unknown Reason (check all that apply): _0 Personal or family history of medullary thyroid cancer _1 MEN2 _2 HbA1c<7% on metformin monotherapy OR already on SGLT2i and do not need to start another anti-hyperglycemic _3 eGFR now <30 _4 High risk of Hypoglycemia _5 History of pancreatitis _6 Significant gastroparesis _7 Prior gastric surgery _8 Patient has experienced other side effects to this medication before _9 Patient will be unable to adhere/monitor _10 Patient unable to afford it _11 Patient does not want to take this medication _12 Pregnancy _13 Other (specify: ) _14 Unknown Reason Date discontinued:        / /             MM DD YYYY Reason (check all that apply): _15 Medullary thyroid cancer _16 MEN2 _17 eGFR now <30 _18 Hypoglycemia _19 Pancreatitis _20 Significant gastroparesis _21 Gastric surgery _22 Other medication side       effects _23   Patient  unable    to adhere/monitor                                  _24 Had an operation/procedure that required stopping it _25 Patient unable to afford it _26 Patient no longer wants to take this medication _27 Pregnancy _28 Other (specify: ) _29 Unknown Reason   If started or changed > Medication Name: _30 Albiglutide (Tanzeum) _31 Dulaglutide (Trulicity) <ZWCHENIDPOEUMPNT>_6<\/[id number]>_09 Exanatide (Byetta, Bydureon) _33 Liraglutide (Victoza, Saxenda) _34 Lixisenatide (Adlyxin) _35 Semaglutice (Ozempic)      Medication Currently Prescribed? If started since last visit: If not started since last visit: If stopped since last visit:  Other non Insulin diabetes medications _36 No _37 Yes >  Medication Name (check all that apply): _38 Acarbose (Precose) _39 Miglitol (Glyset) _40 Glimepiride (Amaryl) _41 Glipizide (Amaryl) _42 Glyburide (Diabeta,       Glynase,   Micronase) _43 Metformin (Fortamet,        Glucophage[including XR],        Glumetza, Riomet) _44 Pioglitazone (Actos) _45 Nateglinide (Starlix) _46 Pramlintide (Symilin) _47 Repaglinide (Prandin) _48 Rosiglitazone (Avandia) _49 Alogliptin (Nesina) _50 Linagliptin (Tradjenta) _51 Saxagliptin (Onglyza) _52 Sitagliptin (Januvia) _53 Bromocriptine Quick Release (Cycloset)     Insulin _54 No _55  Yes > total daily dose:170units     STATIN INTOLERANCE (PER EHR/OTHER SOURCE DATA)  1. Was CK checked? _56 No _57 Yes   >If yes, select from the following: _58 CK not elevated _59 CK elevated 1-5x upper limit of normal _60 CK elevated >5x upper limit of normal  2. Does the patient have muscle symptoms? _61 No _62 Yes    >If yes, select from the following: Location and pattern of muscle symptoms (select all that apply) _63 Symmetric, hip flexors or thighs _64 Symmetric, calves _65 Symmetric, proximal upper extremity _66 Asymmetric, intermittent, or not specific to any area _67 Unknown   Timing of muscle symptom in relation to starting statin regimen _68 <4 weeks _69 4-12 weeks _70 >12 weeks _71 Unknown   Timing of muscle symptoms improvement after withdrawal of statin _72 <2 weeks _73 2-4 weeks _74 No improvement after 4 weeks _75 Unknown  3. Was patient re-challenged with a statin regimen (even if same statin compound or regimen as above)?  _76 No  _77 Yes  _78 Unknown  >If yes, select from the following: Timing of recurrence of similar muscle symptoms in relation to starting second regimen _79 <4 weeks _80 4-12 weeks _81 >12 weeks _82 Similar symptoms did not recur _83 Unknown   [email or web address]

## 2020-05-14 ENCOUNTER — Telehealth: Payer: Self-pay | Admitting: Cardiology

## 2020-05-14 DIAGNOSIS — H6120 Impacted cerumen, unspecified ear: Secondary | ICD-10-CM | POA: Insufficient documentation

## 2020-05-14 NOTE — Telephone Encounter (Signed)
I attempted to contact patient on 05/14/20 for follow up visit from patients recall lists with Dr. Gardiner Rhyme. The patient didn't answer so I left message for patient to return call to get appointment scheduled.

## 2020-05-15 DIAGNOSIS — B369 Superficial mycosis, unspecified: Secondary | ICD-10-CM | POA: Diagnosis not present

## 2020-05-15 DIAGNOSIS — H9193 Unspecified hearing loss, bilateral: Secondary | ICD-10-CM | POA: Insufficient documentation

## 2020-05-15 DIAGNOSIS — Z974 Presence of external hearing-aid: Secondary | ICD-10-CM | POA: Diagnosis not present

## 2020-05-15 DIAGNOSIS — H903 Sensorineural hearing loss, bilateral: Secondary | ICD-10-CM | POA: Diagnosis not present

## 2020-05-15 DIAGNOSIS — H6240 Otitis externa in other diseases classified elsewhere, unspecified ear: Secondary | ICD-10-CM | POA: Insufficient documentation

## 2020-05-15 DIAGNOSIS — H6123 Impacted cerumen, bilateral: Secondary | ICD-10-CM | POA: Insufficient documentation

## 2020-05-15 DIAGNOSIS — H938X3 Other specified disorders of ear, bilateral: Secondary | ICD-10-CM | POA: Diagnosis not present

## 2020-05-15 DIAGNOSIS — L299 Pruritus, unspecified: Secondary | ICD-10-CM | POA: Diagnosis not present

## 2020-05-15 DIAGNOSIS — H6243 Otitis externa in other diseases classified elsewhere, bilateral: Secondary | ICD-10-CM | POA: Diagnosis not present

## 2020-05-29 ENCOUNTER — Encounter: Payer: Medicare PPO | Admitting: Neurology

## 2020-06-14 ENCOUNTER — Telehealth: Payer: Self-pay | Admitting: *Deleted

## 2020-06-14 ENCOUNTER — Encounter: Payer: Self-pay | Admitting: *Deleted

## 2020-06-14 DIAGNOSIS — E669 Obesity, unspecified: Secondary | ICD-10-CM

## 2020-06-14 DIAGNOSIS — E1169 Type 2 diabetes mellitus with other specified complication: Secondary | ICD-10-CM

## 2020-06-14 NOTE — Telephone Encounter (Signed)
I left message for 24-month Coordinate-Diabetes Study.

## 2020-06-14 NOTE — Research (Signed)
Patient called me back for 27-month Coordinate-Diabetes Study. Patient is doing well, Blood sugars are running 110-190. The last hemoglobin A1C is 7.6%. Patient is on Insulin glargine and insulin lispro. Patient maintains close follow-up with doctor for diabetes.

## 2020-06-26 DIAGNOSIS — B369 Superficial mycosis, unspecified: Secondary | ICD-10-CM | POA: Diagnosis not present

## 2020-06-26 DIAGNOSIS — H9193 Unspecified hearing loss, bilateral: Secondary | ICD-10-CM | POA: Diagnosis not present

## 2020-06-26 DIAGNOSIS — H6240 Otitis externa in other diseases classified elsewhere, unspecified ear: Secondary | ICD-10-CM | POA: Diagnosis not present

## 2020-06-26 DIAGNOSIS — G4733 Obstructive sleep apnea (adult) (pediatric): Secondary | ICD-10-CM | POA: Diagnosis not present

## 2020-07-24 ENCOUNTER — Encounter: Payer: Self-pay | Admitting: Podiatry

## 2020-07-24 ENCOUNTER — Other Ambulatory Visit: Payer: Self-pay

## 2020-07-24 ENCOUNTER — Ambulatory Visit: Payer: Medicare PPO | Admitting: Podiatry

## 2020-07-24 DIAGNOSIS — Z7901 Long term (current) use of anticoagulants: Secondary | ICD-10-CM

## 2020-07-24 DIAGNOSIS — M216X9 Other acquired deformities of unspecified foot: Secondary | ICD-10-CM

## 2020-07-24 DIAGNOSIS — E119 Type 2 diabetes mellitus without complications: Secondary | ICD-10-CM | POA: Diagnosis not present

## 2020-07-24 DIAGNOSIS — L84 Corns and callosities: Secondary | ICD-10-CM | POA: Diagnosis not present

## 2020-07-24 DIAGNOSIS — E11621 Type 2 diabetes mellitus with foot ulcer: Secondary | ICD-10-CM

## 2020-07-24 DIAGNOSIS — L97529 Non-pressure chronic ulcer of other part of left foot with unspecified severity: Secondary | ICD-10-CM | POA: Diagnosis not present

## 2020-07-24 MED ORDER — MUPIROCIN 2 % EX OINT: 1.0000 "application " | TOPICAL_OINTMENT | Freq: Two times a day (BID) | CUTANEOUS | 2 refills | Status: DC

## 2020-07-25 NOTE — Progress Notes (Signed)
Cardiology Office Note:    Date:  07/26/2020   ID:  [name], DOB [birth year], MRN [id number]  PCP:  [name], MD  Cardiologist:  No primary care provider on file.  Electrophysiologist:  None   Referring MD: [name], MD   Chief Complaint  Patient presents with   Dizziness    History of Present Illness:    Joshua [name] is a 69 y.o. male with a hx of HCM, hypertension, diabtes, OSA, PE after knee surgery, essential tremor status post DBS June 2020, CVA who presents for follow-up.  Admitted to [facility] from 08/28/19 through 08/31/19 with an acute CVA.  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  MRI brain showed right internal capsule infarct.  Echocardiogram showed no cardiogenic source of embolism.  He was started on heparin given subclavian artery thrombosis, and this was transitioned to Eliquis.  It was unclear if the thrombosis was due to a cardiac source or developed in situ.  Stroke team recommended Eliquis for 2 months and repeating CTA neck; if CTA neck negative and no atrial fibrillation noted on 30-day monitor, stroke team recommended stopping Eliquis and resuming antiplatelet agent.  TTE was notable for an incidental finding of asymmetric basal septal hypertrophy.  He was referred to cardiology and seen on 09/19/19.  Cardiac MRI was ordered, which showed Basal septal hypertrophy measuring up to 81mm (lateral wall 24mm), consistent with hypertrophic cardiomyopathy.  PYP scan showed no evidence of amyloid.  Cardiac monitor showed no VT, occasional PVCs (1.3% of beats).   He denies any family history of HCM.  Does have vertigo.  No syncope except with CVA years ago.   Since last clinic visit, he reports that he has been doing well.  Recently had a 3-week vacation to [place], [place], and [place].  States that he walked a lot during the trip, denies any exertional symptoms.  No chest pain or dyspnea.  Activity is primarily  limited by right knee pain, planning knee replacement.  Reports he can walk up flight of stairs without stopping, denies any symptoms.  Does report he has been having issues with lightheadedness.  Does have vertigo, but states symptoms recently have been different.  With vertigo has sensation of the room is spinning, but recently has been feeling lightheaded when he stands.  States that he had a recent episode where he thought he was going to pass out after he stood.  Denies any syncopal episodes though.  Denies any palpitations or lower extremity edema.    Past Medical History:  Diagnosis Date   Benign essential tremor    Benign positional vertigo    CVA (cerebral vascular accident) ([place])    x2 - L retina, 1 right parietal   Degenerative arthritis    Depression    Diabetes mellitus    Dyslipidemia    GERD (gastroesophageal reflux disease)    hiatal hernia   Gout    H/O: vasectomy    Hearing aid worn    b/l   Hx of appendectomy    Hx of tonsillectomy    Hypertension    Ischemic optic neuropathy    on the left   Melanoma ([place])    Obesity    OSA on CPAP    setting = 5   Tremor, essential 06/22/2017   Wears glasses     Past Surgical History:  Procedure Laterality Date   APPENDECTOMY  arthroscopic knee surgery Bilateral    CATARACT EXTRACTION Bilateral    COLONOSCOPY     MINOR PLACEMENT OF FIDUCIAL N/A 06/30/2019   Procedure: Fiducial placement;  Surgeon: Erline Levine, MD;  Location: [place];  Service: Neurosurgery;  Laterality: N/A;  Fiducial placement   NASAL SEPTUM SURGERY     PULSE GENERATOR IMPLANT N/A 07/14/2019   Procedure: Left cranial Implanted Pulse Generator and lead extension placement to right chest ;  Surgeon: Erline Levine, MD;  Location: [place];  Service: Neurosurgery;  Laterality: N/A;   SUBTHALAMIC STIMULATOR INSERTION Left 07/07/2019   Procedure: LEFT DEEP BRAIN STIMULATOR PLACEMENT;  Surgeon: Erline Levine, MD;  Location: [place];   Service: Neurosurgery;  Laterality: Left;   TONSILLECTOMY     TOTAL KNEE ARTHROPLASTY Left 03/30/2017   Procedure: LEFT TOTAL KNEE ARTHROPLASTY;  Surgeon: Paralee Cancel, MD;  Location: [facility];  Service: Orthopedics;  Laterality: Left;   WISDOM TOOTH EXTRACTION      Current Medications: Current Meds  Medication Sig   amLODipine (NORVASC) 5 MG tablet Take 5 mg by mouth at bedtime.    apixaban (ELIQUIS) 5 MG TABS tablet Take 1 tablet (5 mg total) by mouth 2 (two) times daily.   atorvastatin (LIPITOR) 80 MG tablet Take 1 tablet (80 mg total) by mouth daily at 6 PM.   DULoxetine (CYMBALTA) 60 MG capsule Take 60 mg by mouth daily.   fenofibrate 54 MG tablet Take 54 mg by mouth daily.   hydrochlorothiazide (HYDRODIURIL) 25 MG tablet Take 25 mg by mouth daily.   Insulin Glargine, 1 Unit Dial, (TOUJEO SOLOSTAR) 300 UNIT/ML SOPN Inject 85 Units into the skin 2 (two) times daily.   insulin lispro (HUMALOG KWIKPEN) 100 UNIT/ML KwikPen Inject 15-25 Units into the skin See admin instructions. Inject 15 units subcutaneously after breakfast and 25 units after lunch and supper   losartan (COZAAR) 100 MG tablet Take 100 mg by mouth daily.   Lutein-Zeaxanthin 25-5 MG CAPS Take 1 tablet by mouth daily.   metFORMIN (GLUCOPHAGE) 1000 MG tablet Take 1,000 mg by mouth 2 (two) times daily with a meal.   Multiple Vitamin (MULTIVITAMIN WITH MINERALS) TABS tablet Take 1 tablet by mouth daily.   mupirocin ointment (BACTROBAN) 2 % Apply 1 application topically 2 (two) times daily.   Omega-3 Fatty Acids (FISH OIL) 1000 MG CAPS Take 2,000 mg by mouth daily. Reported on 02/13/2016   ONETOUCH VERIO test strip    Polyethyl Glycol-Propyl Glycol (SYSTANE OP) Place 1-2 drops into both eyes 4 (four) times daily as needed (dry eyes).    PRESCRIPTION MEDICATION Inhale into the lungs at bedtime. CPAP   SURE COMFORT PEN NEEDLES 31G X 5 MM MISC      Allergies:   Melatonin   Social History   Socioeconomic  History   Marital status: Married    Spouse name: [name]   Number of children: 2   Years of education: Bachelors    Highest education level: Bachelor's degree (e.g., BA, AB, BS)  Occupational History   Occupation: retired    Fish farm manager: [employer]    Comment: teaching/coaching  Tobacco Use   Smoking status: Former Smoker    Packs/day: 1.00    Years: 10.00    Pack years: 10.00    Types: Cigarettes    Quit date: 11/25/1983    Years since quitting: 36.6   Smokeless tobacco: Never Used  Vaping Use   Vaping Use: Never used  Substance and Sexual Activity  Alcohol use: Yes    Alcohol/week: 2.0 standard drinks    Types: 2 Standard drinks or equivalent per week    Comment: occassionally   Drug use: No   Sexual activity: Not on file  Other Topics Concern   Not on file  Social History Narrative   Patient lives at home with wife. [name](   Patient has 2 children that are in good health.    Patient works for [employer]. Retired .   Patient has a Bachelors degree in History.    Social Determinants of Health   Financial Resource Strain:    Difficulty of Paying Living Expenses: Not on file  Food Insecurity:    Worried About Charity fundraiser in the Last Year: Not on file   [employer] of Food in the Last Year: Not on file  Transportation Needs:    Lack of Transportation (Medical): Not on file   Lack of Transportation (Non-Medical): Not on file  Physical Activity:    Days of Exercise per Week: Not on file   Minutes of Exercise per Session: Not on file  Stress:    Feeling of Stress : Not on file  Social Connections:    Frequency of Communication with Friends and Family: Not on file   Frequency of Social Gatherings with Friends and Family: Not on file   Attends Religious Services: Not on file   Active Member of Clubs or Organizations: Not on file   Attends Archivist Meetings: Not on file   Marital Status: Not on file      Family History: The patient's family history includes Alzheimer's disease in his father; Cerebral aneurysm in his mother; Diabetes in his maternal grandmother; Healthy in his son; Tremor in his brother, maternal uncle, and mother. There is no history of Colon cancer.  ROS:   Please see the history of present illness.     All other systems reviewed and are negative.  EKGs/Labs/Other Studies Reviewed:    The following studies were reviewed today:   EKG:  EKG is ordered today.  EKG ordered today shows sinus rhythm, first-degree AV block, rate 85, PVC, left axis deviation, Q waves V1-3 and V6  Cardiac MRI 10/07/19: 1. Limited study, as only a few sequences were able to be completed due to limitations from presence of deep brain stimulator 2. Basal septal hypertrophy measuring up to 11mm (lateral wall 12 mm), consistent with hypertrophic cardiomyopathy 3. Patchy late gadolinium enhancement in basal septum, consistent with HCM 4. Basal inferolateral midwall LGE, which would not be a typical pattern for HCM, as more commonly seen in setting of prior myocarditis or sarcoidosis. 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  Normal LV size with hyperdynamic systolic function (EF 96%) 6.  Normal RV size and systolic function (EF 29%)  TTE 08/29/19: 1. Technically difficult study. Left ventricular ejection fraction appears grossly normal, approximately 55-60%, though difficult visualization even with contrast 2. There is asymmetric basal septal hypertrophy measuring 18 mm in basal septum (12 mm posterior wall). Consider cardiac MRI to assess for hypertrophic cardiomyopathy if clinically indicated 3. Definity contrast agent was given IV to delineate the left ventricular endocardial borders. 4. Global right ventricle has normal systolic function.The right ventricular size is normal. No increase in right ventricular wall thickness. 5. There is  mild dilatation of the aortic root measuring 39 mm. 6. The inferior vena cava is dilated in size with <50% respiratory variability, suggesting  right atrial pressure of 15 mmHg.  PYP scan 10/25/19:  The study is normal.  No evidence of TTR amyloidosis.   Cardiac monitor 11/11/19:  No significant abnormalities  No atrial fibrillation. No VT  Occasional PVCs (1.3% of beats).  1 patient triggered event, which appears to correspond to short pause (1.1 seconds) from a blocked PA   Predominant rhythm is sinus rhythm. Range is 57 to 137 bpm with average of 89 bpm. No atrial fibrillation, sustained ventricular tachycardia, significant pause, or high degree AV block. Occasional PVCs (1.3% of beats). 1 patient triggered events. Triggered event appears to correspond to short pause (1.1 seconds) from a blocked PAC.  No significant abnormalities.   Recent Labs: 08/28/2019: ALT 16 12/15/2019: BUN 26; Creatinine, Ser 1.57; Hemoglobin 12.8; Magnesium 1.7; Platelets 263; Potassium 4.2; Sodium 139  Recent Lipid Panel    Component Value Date/Time   CHOL 183 08/29/2019 0409   TRIG 249 (H) 08/29/2019 0409   HDL 26 (L) 08/29/2019 0409   CHOLHDL 7.0 08/29/2019 0409   VLDL 50 (H) 08/29/2019 0409   LDLCALC 107 (H) 08/29/2019 0409    Physical Exam:    VS:  BP 140/74    Pulse 85    Ht 6\' 4"  (1.93 m)    Wt 253 lb 3.2 oz (114.9 kg)    SpO2 100%    BMI 30.82 kg/m     Wt Readings from Last 3 Encounters:  07/26/20 253 lb 3.2 oz (114.9 kg)  04/25/20 254 lb (115.2 kg)  04/15/20 256 lb (116.1 kg)     GEN: Well nourished, well developed in no acute distress HEENT: Normal NECK: No JVD LYMPHATICS: No lymphadenopathy CARDIAC: RRR, no murmurs, rubs, gallops RESPIRATORY:  Clear to auscultation without rales, wheezing or rhonchi  ABDOMEN: Soft, non-tender, non-distended MUSCULOSKELETAL:  No edema; No deformity  SKIN: Warm and dry NEUROLOGIC:  Alert and oriented x 3 PSYCHIATRIC:  Normal affect    ASSESSMENT:    1. Hypertrophic cardiomyopathy ([place])   2. Dysphagia, unspecified type   3. Cerebrovascular accident (CVA), unspecified mechanism ([place])   4. Essential hypertension   5. Hyperlipidemia, unspecified hyperlipidemia type   6. Lightheadedness   7. Bradycardia    PLAN:    Hypertrophic cardiomyopathy: 78mm (lateral wall 12 mm) on CMR.  In addition, had unusual scar pattern for HCM, with basal inferolateral scar on MRI.  Fabry's disease can be associated with this scar pattern and asymmetric hypertrophy, so alpha galactosidase was checked and was normal. Scar pattern not c/w amyloid on CMR, but unfortunately unable to do T1 mapping to r/o amyloid due to DBS.  No evidence of amyloid on PYP scan.  Scar pattern may represent prior episode of myocarditis.  Suspect HCM.  No obstruction.  Asymptomatic.  Recommended screening of first-degree relatives with TTE, he reports that he has discussed with his sons.  No VT on cardiac monitor.  Preop evaluation: Prior to right knee replacement.  No anginal symptoms.  Good functional capacity (greater than 4 METS).  RCRI score 2 given history of CVA, IDDM, which corresponds to 10% 30-day risk of MI, cardiac arrest, or death.  No further cardiac work-up recommended prior to surgery.  Lightheadedness: Reports recent issues with lightheadedness with standing.  Does have known vertigo, which may be contributing, but symptoms of lightheadedness have been different than normal vertigo symptoms where he feels like room is spinning.  Orthostatics in clinic today showed no drop in blood pressure with standing, but was noted to  have bradycardia with heart rate down to 45 after standing for 3 minutes.  Will check Zio patch x7 days to evaluate bradycardia.  CVA: Subclavian artery thrombosis diagnosed, unclear if cardioembolic source or developed in situ.  Currently on Eliquis.  No AF on cardiac monitor.  Hypertension: On amlodipine 5 mg daily, hydrochlorothiazide  25 mg daily, losartan 100 mg daily.  Appears controlled   Hyperlipidemia: On atorvastatin 80 mg daily.  LDL 107 on 08/29/2019.  He is having labs checked with PCP this month, will follow up results.  Type 2 diabetes: A1c 7.9%.  On insulin  PVCs: occasional (1.3%) on monitor.  He is asymptomatic and with normal LV systolic function, no treatment indicated  Dysphagia: Reports that he has been choking on liquids since his CVA.  Will refer to speech therapy for evaluation  RTC in 3 months   Medication Adjustments/Labs and Tests Ordered: Current medicines are reviewed at length with the patient today.  Concerns regarding medicines are outlined above.  Orders Placed This Encounter  Procedures   Ambulatory referral to Speech Therapy   LONG TERM MONITOR (3-14 DAYS)   EKG 12-Lead   No orders of the defined types were placed in this encounter.   Patient Instructions  Medication Instructions:  Your physician recommends that you continue on your current medications as directed. Please refer to the Current Medication list given to you today.  *If you need a refill on your cardiac medications before your next appointment, please call your pharmacy*  Testing:  ZIO XT- Long Term Monitor Instructions   Your physician has requested you wear your ZIO patch monitor 7 days.   This is a single patch monitor.  Ir[employer] supplies one patch monitor per enrollment.  Additional stickers are not available.   Please do not apply patch if you will be having a Nuclear Stress Test, Echocardiogram, Cardiac CT, MRI, or Chest Xray during the time frame you would be wearing the monitor. The patch cannot be worn during these tests.  You cannot remove and re-apply the ZIO XT patch monitor.   Your ZIO patch monitor will be sent USPS Priority mail from [facility] directly to your home address. The monitor may also be mailed to a PO BOX if home delivery is not available.   It may take 3-5 days to receive  your monitor after you have been enrolled.   Once you have received you monitor, please review enclosed instructions.  Your monitor has already been registered assigning a specific monitor serial # to you.   Applying the monitor   Shave hair from upper left chest.   Hold abrader disc by orange tab.  Rub abrader in 40 strokes over left upper chest as indicated in your monitor instructions.   Clean area with 4 enclosed alcohol pads .  Use all pads to assure are is cleaned thoroughly.  Let dry.   Apply patch as indicated in monitor instructions.  Patch will be place under collarbone on left side of chest with arrow pointing upward.   Rub patch adhesive wings for 2 minutes.Remove white label marked "1".  Remove white label marked "2".  Rub patch adhesive wings for 2 additional minutes.   While looking in a mirror, press and release button in center of patch.  A small green light will flash 3-4 times .  This will be your only indicator the monitor has been turned on.     Do not shower for the first 24 hours.  You  may shower after the first 24 hours.   Press button if you feel a symptom. You will hear a small click.  Record Date, Time and Symptom in the Patient Log Book.   When you are ready to remove patch, follow instructions on last 2 pages of Patient Log Book.  Stick patch monitor onto last page of Patient Log Book.   Place Patient Log Book in [place] box.  Use locking tab on box and tape box closed securely.  The [employer] and [employer] has [employer] on it.  Please place in mailbox as soon as possible.  Your physician should have your test results approximately 7 days after the monitor has been mailed back to [facility].   Call [place] at [phone] if you have questions regarding your ZIO XT patch monitor.  Call them immediately if you see an orange light blinking on your monitor.   If your monitor falls off in less than 4 days contact our Monitor department at  [phone].  If your monitor becomes loose or falls off after 4 days call [employer] at [phone] for suggestions on securing your monitor.    Follow-Up: At [facility], you and your health needs are our priority.  As part of our continuing mission to provide you with exceptional heart care, we have created designated Provider Care Teams.  These Care Teams include your primary Cardiologist (physician) and Advanced Practice Providers (APPs -  Physician Assistants and Nurse Practitioners) who all work together to provide you with the care you need, when you need it.  We recommend signing up for the patient portal called "MyChart".  Sign up information is provided on this After Visit Summary.  MyChart is used to connect with patients for Virtual Visits (Telemedicine).  Patients are able to view lab/test results, encounter notes, upcoming appointments, etc.  Non-urgent messages can be sent to your provider as well.   To learn more about what you can do with MyChart, go to [email or web address].    Your next appointment:   3 month(s)  The format for your next appointment:   In Person  Provider:   Oswaldo Milian, MD   Other Instructions You have been referred to: Speech therapy --they will call to schedule an appointment  We recommend obtaining a blood pressure cuff for home.  OMRON upper arm cuff is recommended.       Signed, Donato Heinz, MD  07/26/2020 8:52 AM    [place]

## 2020-07-26 ENCOUNTER — Other Ambulatory Visit: Payer: Self-pay

## 2020-07-26 ENCOUNTER — Ambulatory Visit: Payer: Medicare PPO | Admitting: Cardiology

## 2020-07-26 ENCOUNTER — Encounter: Payer: Self-pay | Admitting: *Deleted

## 2020-07-26 VITALS — BP 140/74 | HR 85 | Ht 76.0 in | Wt 253.2 lb

## 2020-07-26 DIAGNOSIS — R001 Bradycardia, unspecified: Secondary | ICD-10-CM

## 2020-07-26 DIAGNOSIS — I422 Other hypertrophic cardiomyopathy: Secondary | ICD-10-CM | POA: Diagnosis not present

## 2020-07-26 DIAGNOSIS — R42 Dizziness and giddiness: Secondary | ICD-10-CM

## 2020-07-26 DIAGNOSIS — I10 Essential (primary) hypertension: Secondary | ICD-10-CM | POA: Diagnosis not present

## 2020-07-26 DIAGNOSIS — I639 Cerebral infarction, unspecified: Secondary | ICD-10-CM

## 2020-07-26 DIAGNOSIS — E785 Hyperlipidemia, unspecified: Secondary | ICD-10-CM | POA: Diagnosis not present

## 2020-07-26 DIAGNOSIS — E1129 Type 2 diabetes mellitus with other diabetic kidney complication: Secondary | ICD-10-CM | POA: Diagnosis not present

## 2020-07-26 DIAGNOSIS — R1310 Dysphagia, unspecified: Secondary | ICD-10-CM

## 2020-07-26 DIAGNOSIS — Z125 Encounter for screening for malignant neoplasm of prostate: Secondary | ICD-10-CM | POA: Diagnosis not present

## 2020-07-26 MED ORDER — ADULT BLOOD PRESSURE CUFF LG KIT: 1.0000 | PACK | Freq: Once | 0 refills | Status: AC

## 2020-07-26 NOTE — Patient Instructions (Signed)
Medication Instructions:  Your physician recommends that you continue on your current medications as directed. Please refer to the Current Medication list given to you today.  *If you need a refill on your cardiac medications before your next appointment, please call your pharmacy*  Testing:  ZIO XT- Long Term Monitor Instructions   Your physician has requested you wear your ZIO patch monitor 7 days.   This is a single patch monitor.  Ir[employer] supplies one patch monitor per enrollment.  Additional stickers are not available.   Please do not apply patch if you will be having a Nuclear Stress Test, Echocardiogram, Cardiac CT, MRI, or Chest Xray during the time frame you would be wearing the monitor. The patch cannot be worn during these tests.  You cannot remove and re-apply the ZIO XT patch monitor.   Your ZIO patch monitor will be sent USPS Priority mail from [facility] directly to your home address. The monitor may also be mailed to a PO BOX if home delivery is not available.   It may take 3-5 days to receive your monitor after you have been enrolled.   Once you have received you monitor, please review enclosed instructions.  Your monitor has already been registered assigning a specific monitor serial # to you.   Applying the monitor   Shave hair from upper left chest.   Hold abrader disc by orange tab.  Rub abrader in 40 strokes over left upper chest as indicated in your monitor instructions.   Clean area with 4 enclosed alcohol pads .  Use all pads to assure are is cleaned thoroughly.  Let dry.   Apply patch as indicated in monitor instructions.  Patch will be place under collarbone on left side of chest with arrow pointing upward.   Rub patch adhesive wings for 2 minutes.Remove white label marked "1".  Remove white label marked "2".  Rub patch adhesive wings for 2 additional minutes.   While looking in a mirror, press and release button in center of patch.  A small green  light will flash 3-4 times .  This will be your only indicator the monitor has been turned on.     Do not shower for the first 24 hours.  You may shower after the first 24 hours.   Press button if you feel a symptom. You will hear a small click.  Record Date, Time and Symptom in the Patient Log Book.   When you are ready to remove patch, follow instructions on last 2 pages of Patient Log Book.  Stick patch monitor onto last page of Patient Log Book.   Place Patient Log Book in [place] box.  Use locking tab on box and tape box closed securely.  The [employer] has [employer] on it.  Please place in mailbox as soon as possible.  Your physician should have your test results approximately 7 days after the monitor has been mailed back to [facility].   Call [place] at [phone] if you have questions regarding your ZIO XT patch monitor.  Call them immediately if you see an orange light blinking on your monitor.   If your monitor falls off in less than 4 days contact our Monitor department at [phone].  If your monitor becomes loose or falls off after 4 days call [employer] at [phone] for suggestions on securing your monitor.    Follow-Up: At [facility], you and your health needs are our priority.  As part  of our continuing mission to provide you with exceptional heart care, we have created designated Provider Care Teams.  These Care Teams include your primary Cardiologist (physician) and Advanced Practice Providers (APPs -  Physician Assistants and Nurse Practitioners) who all work together to provide you with the care you need, when you need it.  We recommend signing up for the patient portal called "MyChart".  Sign up information is provided on this After Visit Summary.  MyChart is used to connect with patients for Virtual Visits (Telemedicine).  Patients are able to view lab/test results, encounter notes, upcoming appointments, etc.  Non-urgent  messages can be sent to your provider as well.   To learn more about what you can do with MyChart, go to [email or web address].    Your next appointment:   3 month(s)  The format for your next appointment:   In Person  Provider:   Oswaldo Milian, MD   Other Instructions You have been referred to: Speech therapy --they will call to schedule an appointment  We recommend obtaining a blood pressure cuff for home.  OMRON upper arm cuff is recommended.

## 2020-07-26 NOTE — Progress Notes (Signed)
Patient ID: Joshua [name], male   DOB: November 29, 1950, 69 y.o.   MRN: [id number] Patient enrolled for [employer] to ship a 7 day ZIO XT long term holter monitor to his home.

## 2020-08-01 ENCOUNTER — Ambulatory Visit (INDEPENDENT_AMBULATORY_CARE_PROVIDER_SITE_OTHER): Payer: Medicare PPO

## 2020-08-01 DIAGNOSIS — R42 Dizziness and giddiness: Secondary | ICD-10-CM | POA: Diagnosis not present

## 2020-08-01 DIAGNOSIS — R001 Bradycardia, unspecified: Secondary | ICD-10-CM

## 2020-08-02 DIAGNOSIS — Z794 Long term (current) use of insulin: Secondary | ICD-10-CM | POA: Diagnosis not present

## 2020-08-02 DIAGNOSIS — Z1212 Encounter for screening for malignant neoplasm of rectum: Secondary | ICD-10-CM | POA: Diagnosis not present

## 2020-08-02 DIAGNOSIS — Z0000 Encounter for general adult medical examination without abnormal findings: Secondary | ICD-10-CM | POA: Diagnosis not present

## 2020-08-02 DIAGNOSIS — R251 Tremor, unspecified: Secondary | ICD-10-CM | POA: Diagnosis not present

## 2020-08-02 DIAGNOSIS — Z978 Presence of other specified devices: Secondary | ICD-10-CM | POA: Diagnosis not present

## 2020-08-02 DIAGNOSIS — E1140 Type 2 diabetes mellitus with diabetic neuropathy, unspecified: Secondary | ICD-10-CM | POA: Diagnosis not present

## 2020-08-02 DIAGNOSIS — E1121 Type 2 diabetes mellitus with diabetic nephropathy: Secondary | ICD-10-CM | POA: Diagnosis not present

## 2020-08-02 DIAGNOSIS — I422 Other hypertrophic cardiomyopathy: Secondary | ICD-10-CM | POA: Diagnosis not present

## 2020-08-02 DIAGNOSIS — E1129 Type 2 diabetes mellitus with other diabetic kidney complication: Secondary | ICD-10-CM | POA: Diagnosis not present

## 2020-08-02 DIAGNOSIS — N1831 Chronic kidney disease, stage 3a: Secondary | ICD-10-CM | POA: Diagnosis not present

## 2020-08-02 DIAGNOSIS — I129 Hypertensive chronic kidney disease with stage 1 through stage 4 chronic kidney disease, or unspecified chronic kidney disease: Secondary | ICD-10-CM | POA: Diagnosis not present

## 2020-08-02 NOTE — Progress Notes (Signed)
Subjective: 69 year old male presents the office today for concerns of a callus on the right foot along the side.  He states he has not seen any drainage or pus or swelling he has had no open sores.  He also is requesting diabetic shoes.  No other concerns today. Denies any systemic complaints such as fevers, chills, nausea, vomiting. No acute changes since last appointment, and no other complaints at this time.   He is on Eliquis  Objective: AAO x3, NAD DP/PT pulses palpable bilaterally, CRT less than 3 seconds Sensation decreased with Semmes Weinstein monofilament. Hyperkeratotic lesion submetatarsal 5 on the right foot without any underlying ulceration drainage or signs of infection.  There is no open lesion identified at this time.  Hammertoes are present. No pain with calf compression, swelling, warmth, erythema  Assessment: Hyperkeratotic lesion on Eliquis, type 2 diabetes with neuropathy/diabetic foot exam.  Plan: -All treatment options discussed with the patient including all alternatives, risks, complications.  -Debrided the hyperkeratotic lesion without any complications or bleeding.  Recommend offloading moisturizer.  I will have him follow-up for diabetic shoes.  Discussed daily foot inspection. -Patient encouraged to call the office with any questions, concerns, change in symptoms.   Trula Slade DPM

## 2020-08-08 ENCOUNTER — Other Ambulatory Visit: Payer: Self-pay | Admitting: Cardiology

## 2020-08-08 NOTE — Telephone Encounter (Signed)
Sounds like plan per stroke team was to discontinue eliquis if no afib and the subclavian artery thrombus resolved on repeat imaging.  Looks like had persistent thrombus on repeat CTA 11/2019, so plan was to continue eliquis

## 2020-08-08 NOTE — Telephone Encounter (Signed)
69 yo 114.9 kg Dx subclavian artery thrombus; per OV on oct/2020, plan to transition to antiplatelet if no Afib  Ho hx of Afib noted.  *Patient to stay on Eliquis or transition to antiplatelet?

## 2020-08-14 ENCOUNTER — Other Ambulatory Visit: Payer: Medicare PPO | Admitting: Orthotics

## 2020-08-21 ENCOUNTER — Ambulatory Visit: Payer: Medicare Other | Admitting: Neurology

## 2020-08-21 ENCOUNTER — Telehealth: Payer: Self-pay | Admitting: Neurology

## 2020-08-21 DIAGNOSIS — H6123 Impacted cerumen, bilateral: Secondary | ICD-10-CM | POA: Diagnosis not present

## 2020-08-21 DIAGNOSIS — R001 Bradycardia, unspecified: Secondary | ICD-10-CM | POA: Diagnosis not present

## 2020-08-21 DIAGNOSIS — R42 Dizziness and giddiness: Secondary | ICD-10-CM | POA: Diagnosis not present

## 2020-08-21 NOTE — Telephone Encounter (Signed)
Spoke with patient and he stated that he has not made any changes to his device.   Asked patient to change setting to program 1 and he states he is not sure he know how to change the setting but he will try and call the office back in the morning and let us know how he is doing.

## 2020-08-21 NOTE — Telephone Encounter (Signed)
Patient wants to move his DBS appointment (60 minutes) up to sooner from 10/25/20. He said, "I'm having a lot more shaking in my right hand, especially when doing stuff like brushing my teeth."

## 2020-08-21 NOTE — Telephone Encounter (Signed)
He has 2 different programs set on his device.  He was on program 2 when he left here.  Has he tried to change to program 1 to see how he does?

## 2020-08-29 DIAGNOSIS — M1711 Unilateral primary osteoarthritis, right knee: Secondary | ICD-10-CM | POA: Diagnosis not present

## 2020-08-29 DIAGNOSIS — M25561 Pain in right knee: Secondary | ICD-10-CM | POA: Diagnosis not present

## 2020-08-30 ENCOUNTER — Telehealth: Payer: Self-pay | Admitting: *Deleted

## 2020-08-30 DIAGNOSIS — Z20822 Contact with and (suspected) exposure to covid-19: Secondary | ICD-10-CM | POA: Diagnosis not present

## 2020-08-30 DIAGNOSIS — Z20828 Contact with and (suspected) exposure to other viral communicable diseases: Secondary | ICD-10-CM | POA: Diagnosis not present

## 2020-08-30 NOTE — Telephone Encounter (Signed)
Patient is returning call.  °

## 2020-08-30 NOTE — Telephone Encounter (Signed)
Called patient to discuss referral to Speech therapy.   Received message that patient will need modified barium swallow test prior to them scheduling appointment.    LMTCB. Dr. Gardiner Rhyme ok ordering or neurology if he prefers?

## 2020-08-30 NOTE — Telephone Encounter (Signed)
Spoke to patient . Patient states he had a speech swallowing test sometime in Nov 2020 after his stroke.  patient states if he has to have another one -he is okay with  The process,and Dr Gardiner Rhyme can  Schedule  it for speech therapy .Marland Kitchen

## 2020-08-31 ENCOUNTER — Other Ambulatory Visit: Payer: Self-pay | Admitting: *Deleted

## 2020-08-31 DIAGNOSIS — R1310 Dysphagia, unspecified: Secondary | ICD-10-CM

## 2020-08-31 DIAGNOSIS — I639 Cerebral infarction, unspecified: Secondary | ICD-10-CM

## 2020-08-31 NOTE — Telephone Encounter (Signed)
Called to schedule patient for speech therapy ordered by Dr. Idamae Lusher needs to have recent modified bariusm swallow before appointment can be scheduled.  Note to Vira Agar, RN for Dr. Gardiner Rhyme for order.

## 2020-08-31 NOTE — Telephone Encounter (Signed)
Modified barium swallow completed January 2021.   Per speech therapy, updated modified barium swallow needed.     Order placed.

## 2020-09-01 DIAGNOSIS — Z23 Encounter for immunization: Secondary | ICD-10-CM | POA: Diagnosis not present

## 2020-09-04 ENCOUNTER — Other Ambulatory Visit (HOSPITAL_COMMUNITY): Payer: Self-pay

## 2020-09-04 DIAGNOSIS — R1310 Dysphagia, unspecified: Secondary | ICD-10-CM

## 2020-09-10 ENCOUNTER — Ambulatory Visit: Payer: Medicare PPO | Admitting: Orthotics

## 2020-09-10 ENCOUNTER — Other Ambulatory Visit: Payer: Self-pay

## 2020-09-10 DIAGNOSIS — M216X9 Other acquired deformities of unspecified foot: Secondary | ICD-10-CM

## 2020-09-10 DIAGNOSIS — L84 Corns and callosities: Secondary | ICD-10-CM

## 2020-09-10 DIAGNOSIS — L97529 Non-pressure chronic ulcer of other part of left foot with unspecified severity: Secondary | ICD-10-CM

## 2020-09-10 DIAGNOSIS — E11621 Type 2 diabetes mellitus with foot ulcer: Secondary | ICD-10-CM

## 2020-09-10 DIAGNOSIS — L97521 Non-pressure chronic ulcer of other part of left foot limited to breakdown of skin: Secondary | ICD-10-CM

## 2020-09-10 NOTE — Progress Notes (Signed)

## 2020-09-13 ENCOUNTER — Ambulatory Visit (HOSPITAL_COMMUNITY)
Admission: RE | Admit: 2020-09-13 | Discharge: 2020-09-13 | Disposition: A | Discharge status: Home / Self Care | Payer: Medicare PPO | Source: Ambulatory Visit | Attending: Cardiology | Admitting: Cardiology

## 2020-09-13 ENCOUNTER — Other Ambulatory Visit: Payer: Self-pay

## 2020-09-13 DIAGNOSIS — R1310 Dysphagia, unspecified: Secondary | ICD-10-CM | POA: Insufficient documentation

## 2020-09-13 DIAGNOSIS — I639 Cerebral infarction, unspecified: Secondary | ICD-10-CM | POA: Insufficient documentation

## 2020-09-13 NOTE — Progress Notes (Signed)
Modified Barium Swallow Progress Note  Patient Details  Name: Joshua [name] MRN: [id number] Date of Birth: [birth year]  Today's Date: 09/13/2020  Modified Barium Swallow completed.  Full report located under Chart Review in the Imaging Section.  Brief recommendations include the following:  Clinical Impression  Pt presented with generally functional swallow with one incident of trace aspiration of thin liquids before the swallow (PAS 7), eliciting a cough response.  Subsequent swallows were protective and there were no further incidents of aspiration.  Oral phase was normal.  There was good pharyngeal squeeze with no residuel post-swallow.  There was adequate laryngeal vestibule closure.  During incident of trace aspiration, thin liquid entered the vestibule just prior to closure.   Reviewed the study with Mr. Gudiel - recommend continuing a regular diet, thin liquids.  We discussed modification of behaviors while eating/drinking to improve comfort/decrease likelihood of coughing - these include small sips, not mixing solids/liquids simultaneously.  No change to diet textures is recommended.  Pt agrees with plan.  No SLP f/u is needed.    Swallow Evaluation Recommendations       SLP Diet Recommendations: Regular solids;Thin liquid   Liquid Administration via: Straw;Cup   Medication Administration: Whole meds with liquid   Supervision: Patient able to self feed   Compensations: Small sips/bites       Oral Care Recommendations: Oral care BID      Nyair Depaulo L. Tivis Ringer, [place] Office number [phone] Pager [phone]   Juan Quam Laurice 09/13/2020,12:13 PM

## 2020-09-20 ENCOUNTER — Telehealth: Payer: Self-pay | Admitting: *Deleted

## 2020-09-20 NOTE — Telephone Encounter (Signed)
I called patient for 12 month Coordinate-diabetes Study. I left message for patient to call me back.

## 2020-09-25 ENCOUNTER — Encounter: Payer: Self-pay | Admitting: *Deleted

## 2020-09-25 DIAGNOSIS — Z006 Encounter for examination for normal comparison and control in clinical research program: Secondary | ICD-10-CM

## 2020-09-25 NOTE — Research (Signed)
I called patient for 15-month Coordinate-Diabetes Study. Patient follows closely very 3 months with primary doctor for close control of diabetes. I reminded patient importance of daily exercise and following diabetic diet. Patient verbalized understanding. I thanked patient for his participation in the study.

## 2020-09-28 NOTE — Research (Signed)
COORDINATE-Diabetes 12 Month CASE REPORT FORM (Intervention) -EHR Site #:  [id number]   Patient ID: [id number]   40 [id number] EHR REVIEW  Medical Record Check Date 09/25/2020   Vital Status [x] Patient Alive >Date last known alive per EHR:  09/13/2020  [] Patient Dead >> Complete Death Form  [] Unknown   CLINICAL EVENTS / PROCEDURES  Hospitalization since last visit? (>=24 hour stay) [x] No [] Yes  >> if yes, Complete the following  Date of hospital admission:        / /             MM DD YYYY  Primary discharge diagnosis:  *Complete appropriate event validation form [] acute myocardial infarction (heart attack)* [] stroke* [] heart failure* [] coronary revascularization* [] peripheral revascularization* [] cerebral revascularization* [] diabetes (e.g. hypoglycemia, DKA) [] renal failure [] amputation [] other cardiovascular reason [] other NON-cardiovascular reason [] unknown  Other diagnoses not documented above: (check all that apply)  *Complete appropriate event validation form [] acute myocardial infarction (heart attack)* [] stroke* [] heart failure* [] coronary revascularization* [] peripheral revascularization* [] cerebral revascularization* [] diabetes (e.g. hypoglycemia, DKA) [] renal failure [] amputation [] other cardiovascular reason [] other NON-cardiovascular reason [] unknown  Were any of the following outpatient procedures done since the last visit? (I.e. procedures not captured above)  Coronary revascularization [x] No [] Yes >IF YES, Date / /             MM DD YYYY  Peripheral revascularization [x] No [] Yes > IF YES, Date / /             MM DD YYYY  Cerebral revascularization [x] No [] Yes > IF YES, Date / /             MM DD YYYY  Extremity amputation    [x] No [] Yes >IF YES, Date / /             MM      DD       YYYY  Renal replacement therapy (i.e. dialysis)    [x] No [] Yes > IF YES, Date of initiation / /             MM      DD       YYYY   **EDC will allow for collection of  multiple hospitalizations and procedures   MEDICATIONS  Medication Currently Prescribed? If started since last visit: If not started since last visit: If stopped since last visit:  Cardiac Medications  ACE Inhibitor / Angiotensin Receptor Blocker (ARB) / Angiotensin Receptor Neprilysin inhibitor (ARNi)  [] No >  [x] Yes > Since last visit, medication was: [] Stopped [] Not started [] Started [x] Continued same medication [] Continued with medication changes Date started:        / /             MM DD YYYY Who prescribed? [] Cardiology provider  [] Study clinic [] Outside clinic [] Endocrinology provider [] Primary care provider [] Other provider  Specify:                             [] Unknown Reason (check all that apply): [] History of swelling around lips, eyes or face [] Feeling dizzy/lightheaded [] Low blood pressure [] Poor or fluctuating kidney function [] High potassium [] Patient has experienced other side effects to this medication  before [] Patient will be unable to adhere/monitor [] Patient unable to afford it [] Patient does not want to      take this medication [] Pregnancy [] Other (specify: ) [] Unknown Reason Date discontinued:        / /  MM DD YYYY Reason (check all that apply): [] Swelling around lips, eyes       or face [] Feeling dizzy/lightheaded [] Low blood pressure [] Poor or fluctuating kidney function [] High potassium [] Other medication side       effects [] Patient unable to        adhere/monitor [] Had an       operation/procedure that        required stopping it [] Patient unable to afford it [] Patient no longer wants       to take this medication [] Pregnancy [] Other (specify: ) [] Unknown Reason   If started or changed  Medication Name: [] Benazepril (Lotensin) [] Captopril (Capoten) [] Enalapril (Vasotec) [] Fosinopril (Monopril) [] Lisinopril (Zestril, Prinivil) [] Quinapril (Accupril) [] Ramipril (Altace) [] Azilsartan  (Edarbi) [] Candesartan (Atacand) [] Irbesartan (Avapro) [] Losartan (Cozaar) [] Olmesartan (Benicar) [] Telmisartan (Micardis) [] Valsartan (Diovan) [] Sacrubitril/Valsartan (Entresto)     Beta Blocker [x] No []  Yes > [] Acebutolol (Sectral) [] Bisoprolol (Zebeta) [] Carvedilol (Coreg) [] Labetalol (Trandate,      Normodyne) [] Metoprolol succinate (Toprol) [] Metoprolol tartrate       (Lopressor) [] Nadolol (Corgard) [] Nebivolol (Bystolic) [] Propranolol (Inderal) [] Sotalol (Betapace)     Medication Currently Prescribed? If started since last visit: If not started since last visit: If stopped since last visit:  Aldosterone Antagonist [x] No [] Yes > [] Amiloride [] Eplerenone (Inspra) [] Spirinolactone (Aldactone) [] Traimterene (Dyrenium)   Calcium Channel Blocker [x] No []  Yes > Medication Name: [] Amlodipine (Norvasc) [] Diltiazem (Cardizem) [] Felodipine (Plendil) [] Nifedipine (Procardia) [] Verapamil (Calan)   Diuretic Loop [x] No []  Yes > Medication Name: [] Bumetanide (Bumex) [] Ethacrynic acid (Edecrin) [] Furosemide (Lasix) [] Torsemide (Demadex)   Diuretic Thiazide- type [] No [x]  Yes > Medication Name: [] Chlorothiazide      [] Chlorthalidone [x] Hydrochlorothiazide [] Indapamide [] Metolazone   Anticoagulation Therapy (other than Warfarin) [] No [x]  Yes > Medication Name: [x] Apixaban (Eliquis) [] Edoxaban (Lixiana) [] Rivaroxaban Joshua [name]) [] Dabigatran (Redaxa)   Warfarin [x] No []  Yes    Antiplatelet Agent (including aspirin) [x] No []  Yes > Medication Name (check all that apply): [] Aspirin [] Clopidogrel (Plavix) [] Prasugrel (Effient) [] Ticagrelor (Brilinta) [] Ticlopidine (Ticlid) [] Dipyridamole (Persantine)    Medication Currently Prescribed? If started since last visit: If not started since last visit: If stopped since last visit:  Statin  [] No >  [x] Yes > Since last visit, medication was: [] Stopped [] Not started [] Started [x] Continued same  medication and dose [] Continued with dose or medication changes Date started:        / /             MM DD YYYY Who prescribed? [] Cardiology provider [] Endocrinology provider [] Primary care provider [] Other provider  Specify:                             [] Unknown Reason (check all that apply): [] History of Rhabdomyolysis [] LDL-cholesterol already       <70 [] Muscle      aches/pain/weakness [] Mental       fogginess/memory loss [] Liver dysfunction [] Patient has  experienced other side   effects to this medication before [] Patient will be unable to adhere/monitor [] Patient unable to afford it [] Patient does not want to take this medication [] Pregnancy [] Other (specify: ) [] Unknown Reason Date discontinued:        / /             MM DD YYYY Reason (check all that apply): [] Rhabdomyolysis [] Muscle aches/pain/weakness [] Mental fogginess/memory loss [] Liver dysfunction [] Other medication side effects [] Patient unable to          adhere/monitor [] Patient unable to afford it [] Patient no longer wants to take  this medication [] Pregnancy [] Other (specify: ) [] Unknown Reason   If started or changed  Medication Name: [] Atorvastatin (Lipitor) [] Fluvastatin (Lescol) [] Lovastatin (Mevacor) [] Pravastatin (Pravachol) [] Rosuvastatin (Crestor) [] Simvastatin (Zocor) [] Pitatavastatin (Livalo)  Dose: [] 1 mg [] 10 mg [] 2 mg []  20 mg [] 3 mg []  40 mg [] 4 mg []  60 mg [] 5 mg []  80 mg  Frequency: [] Daily  [] Less than daily      Does the patient have statin intolerance that prevents the use of maximum dose of high potency statin? [x] No [] Yes >IF YES, Complete Statin Intolerance form   Non-statin lipid lowering therapy [] No [x] Yes > Medication Name (check all that apply): [] Colesevelam (Welchol) [] Ezetimibe (Zetia) [x] Fibrate [] Niacin [] PCSK9 inhibitor [] Omega 3 acid ethyl esters (Lovaza) [] Icosapent Ethyl (Vascepa) [] Over the counter omega 3 fatty acid or fish  oil supplement     Medication Currently Prescribed? If started since last visit: If not started since last visit: If stopped since last visit:  Diabetes Medications  SGLT2 Inhibitor  [x] No >  [] Yes > Since last visit, medication was: [] Stopped [x] Not started [] Started [] Continued same medication [] Continued with medication changes Date started:        / /             MM DD YYYY Who prescribed? [] Cardiology provider  [] Study clinic [] Outside clinic [] Endocrinology      provider [] Primary care provider [] Other provider  Specify:                             [] Unknown Reason (check all that apply): [] eGFR <45 [] HbA1c<7% on metformin monotherapy OR already on GLP1RA and do not need to start another anti- hyperglycemic [] Already dehydrated [] Low blood pressure [] High risk of Hypoglycemia [] Prior DKA [] Recurrent mycotic genital infections [] History of or at risk for amputation [] Patient has experienced other side effects to this medication before [] Patient will be unable to adhere/monitor [] Patient unable to afford it [] Patient does not want to take this medication [] Pregnancy [] Other (specify: ) [x] Unknown Reason Date discontinued:        / /             MM DD YYYY Reason (check all that apply  [] eGFR now <45 [] Dehydration [] Low blood pressure [] Hypoglycemia [] DKA [] Mycotic genital infection [] Amputation [] Other medication side effects [] Patient unable    to adhere/monitor  [] Had an operation/procedure     that required stopping it [] Patient unable to afford it [] Patient no longer wants to      take this medication [] Pregnancy [] Other (specify: ) [] Unknown Reason   If started or changed  Medication Name: [] Canaglifozin (Invokana) [] Dapagliflozin Wilder Glade) [] Empaglifozin (Jardiance) [] Ertugliflozin Actuary)           Medication Currently Prescribed? If started since last visit: If not started since last visit: If stopped since last visit:   GLP1 Receptor Agonist  [x] No >  [] Yes > Since last visit, medication was: [] Stopped [x] Not started [] Started [] Continued same medication [] Continued with medication changes Date started:        / /             MM DD YYYY Who prescribed? [] Cardiology provider  [] Study clinic [] Outside clinic [] Endocrinology       provider [] Primary care provider [] Other provider > Specify:                             [] Unknown Reason (check all that apply): [] Personal or family  history of medullary thyroid cancer [] MEN2 [] HbA1c<7% on metformin monotherapy OR already on SGLT2i and do not need to start another anti-hyperglycemic [] eGFR now <30 [] High risk of Hypoglycemia [] History of pancreatitis [] Significant gastroparesis [] Prior gastric surgery [] Patient has experienced other side effects to this medication before [] Patient will be unable to adhere/monitor [] Patient unable to afford it [] Patient does not want to take this medication [] Pregnancy [] Other (specify: ) [x] Unknown Reason Date discontinued:        / /             MM DD YYYY Reason (check all that apply): [] Medullary thyroid cancer [] MEN2 [] eGFR now <30 [] Hypoglycemia [] Pancreatitis [] Significant gastroparesis [] Gastric surgery [] Other medication side       effects []   Patient  unable    to adhere/monitor                                  [] Had an operation/procedure that required stopping it [] Patient unable to afford it [] Patient no longer wants to take this medication [] Pregnancy [] Other (specify: ) [] Unknown Reason   If started or changed > Medication Name: [] Albiglutide (Tanzeum) [] Dulaglutide (Trulicity) [] Exanatide (Byetta, Bydureon) [] Liraglutide (Victoza, Saxenda) [] Lixisenatide (Adlyxin) [] Semaglutice (Ozempic)      Medication Currently Prescribed? If started since last visit: If not started since last visit: If stopped since last visit:  Other non Insulin diabetes medications [] No [x] Yes  > Medication Name (check all that apply): [] Acarbose (Precose) [] Miglitol (Glyset) [] Glimepiride (Amaryl) [] Glipizide (Amaryl) [] Glyburide (Diabeta,       Glynase,   Micronase) [x] Metformin (Fortamet,        Glucophage[including XR],        Glumetza, Riomet) [] Pioglitazone (Actos) [] Nateglinide (Starlix) [] Pramlintide (Symilin) [] Repaglinide (Prandin) [] Rosiglitazone (Avandia) [] Alogliptin (Nesina) [] Linagliptin (Tradjenta) [] Saxagliptin (Onglyza) [] Sitagliptin (Januvia) [] Bromocriptine Quick Release (Cycloset)     Insulin [] No [x]  Yes > total daily dose:170 units     STATIN INTOLERANCE (PER EHR/OTHER SOURCE DATA)  1. Was CK checked? _No _Yes   >If yes, select from the following: -CK not elevated -CK elevated 1-5x upper limit of normal -CK elevated >5x upper limit of normal  2. Does the patient have muscle symptoms? _No _Yes    >If yes, select from the following: Location and pattern of muscle symptoms (select all that apply) -Symmetric, hip flexors or thighs -Symmetric, calves -Symmetric, proximal upper extremity -Asymmetric, intermittent, or not specific to any area -Unknown   Timing of muscle symptom in relation to starting statin regimen -<4 weeks -4-12 weeks ->12 weeks -Unknown   Timing of muscle symptoms improvement after withdrawal of statin -<2 weeks -2-4 weeks -No improvement after 4 weeks -Unknown  3. Was patient re-challenged with a statin regimen (even if same statin compound or regimen as above)?  -No  -Yes  -Unknown  >If yes, select from the following: Timing of recurrence of similar muscle symptoms in relation to starting second regimen -<4 weeks -4-12 weeks ->12 weeks -Similar symptoms did not recur -Unknown   [email or web address]

## 2020-10-01 ENCOUNTER — Encounter: Payer: Self-pay | Admitting: *Deleted

## 2020-10-05 DIAGNOSIS — H16223 Keratoconjunctivitis sicca, not specified as Sjogren's, bilateral: Secondary | ICD-10-CM | POA: Diagnosis not present

## 2020-10-05 DIAGNOSIS — H04123 Dry eye syndrome of bilateral lacrimal glands: Secondary | ICD-10-CM | POA: Diagnosis not present

## 2020-10-21 NOTE — Progress Notes (Signed)
Cardiology Office Note:    Date:  10/24/2020   ID:  Joshua [name], DOB Feb 09, 1951, MRN [id number]  PCP:  [name], MD  Cardiologist:  No primary care provider on file.  Electrophysiologist:  None   Referring MD: [name], MD   Chief Complaint  Patient presents with  . Dizziness    History of Present Illness:    Joshua [name] is a 69 y.o. male with a hx of HCM, hypertension, diabetes, OSA, PE after knee surgery, essential tremor status post DBS June 2020, CVA who presents for follow-up.  Admitted to [facility] from 08/28/19 through 08/31/19 with an acute CVA.  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  MRI brain showed right internal capsule infarct.  Echocardiogram showed no cardiogenic source of embolism.  He was started on heparin given subclavian artery thrombosis, and this was transitioned to Eliquis.  It was unclear if the thrombosis was due to a cardiac source or developed in situ.  Stroke team recommended Eliquis for 2 months and repeating CTA neck; if CTA neck negative and no atrial fibrillation noted on 30-day monitor, stroke team recommended stopping Eliquis and resuming antiplatelet agent.  TTE was notable for an incidental finding of asymmetric basal septal hypertrophy.  He was referred to cardiology and seen on 09/19/19.  Cardiac MRI was ordered, which showed Basal septal hypertrophy measuring up to 49mm (lateral wall 68mm), consistent with hypertrophic cardiomyopathy.  PYP scan showed no evidence of amyloid.  Cardiac monitor showed no VT, occasional PVCs (1.3% of beats).   Repeat monitor on 08/22/2020 showed 1 4 beat run of NSVT, 6 runs of SVT longest lasting 16 beats, occasional PVCs (4%).  He denies any family history of HCM.  Does have vertigo.  No syncope except with CVA years ago.   Since last clinic visit, he reports that he has been doing okay.  States that he has felt lethargic.  Denies any chest pain or dyspnea.  Reports  he has continued to have dizziness.  Occurs with standing.  He denies any syncope or palpitations.  Denies any lower extremity edema.  Has been taking Eliquis, denies any bleeding issues.   Past Medical History:  Diagnosis Date  . Benign essential tremor   . Benign positional vertigo   . CVA (cerebral vascular accident) ([place])    x2 - L retina, 1 right parietal  . Degenerative arthritis   . Depression   . Diabetes mellitus   . Dyslipidemia   . GERD (gastroesophageal reflux disease)    hiatal hernia  . Gout   . H/O: vasectomy   . Hearing aid worn    b/l  . Hx of appendectomy   . Hx of tonsillectomy   . Hypertension   . 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 Melanoma ([place])   . Obesity   . OSA on CPAP    setting = 5  . Tremor, essential 06/22/2017  . Wears glasses     Past Surgical History:  Procedure Laterality Date  . APPENDECTOMY    . arthroscopic knee surgery Bilateral   . CATARACT EXTRACTION Bilateral   . COLONOSCOPY    . MINOR PLACEMENT OF FIDUCIAL N/A 06/30/2019   Procedure: Fiducial placement;  Surgeon: Erline Levine, MD;  Location: [place];  Service: Neurosurgery;  Laterality: N/A;  Fiducial placement  . NASAL SEPTUM SURGERY    . PULSE GENERATOR IMPLANT N/A 07/14/2019  Procedure: Left cranial Implanted Pulse Generator and lead extension placement to right chest ;  Surgeon: Erline Levine, MD;  Location: [place];  Service: Neurosurgery;  Laterality: N/A;  . SUBTHALAMIC STIMULATOR INSERTION Left 07/07/2019   Procedure: LEFT DEEP BRAIN STIMULATOR PLACEMENT;  Surgeon: Erline Levine, MD;  Location: [place];  Service: Neurosurgery;  Laterality: Left;  . TONSILLECTOMY    . TOTAL KNEE ARTHROPLASTY Left 03/30/2017   Procedure: LEFT TOTAL KNEE ARTHROPLASTY;  Surgeon: Paralee Cancel, MD;  Location: [facility];  Service: Orthopedics;  Laterality: Left;  . WISDOM TOOTH EXTRACTION      Current Medications: Current Meds  Medication Sig  . acetic acid-hydrocortisone (VOSOL-HC) OTIC  solution hydrocortisone-acetic acid 1 %-2 % ear drops  . amLODipine (NORVASC) 5 MG tablet Take 5 mg by mouth at bedtime.   Marland Kitchen atorvastatin (LIPITOR) 80 MG tablet Take 1 tablet (80 mg total) by mouth daily at 6 PM.  . DULoxetine (CYMBALTA) 60 MG capsule Take 60 mg by mouth daily.  Marland Kitchen ELIQUIS 5 MG TABS tablet TAKE 1 TABLET BY MOUTH TWICE DAILY.  . fenofibrate 54 MG tablet Take 54 mg by mouth daily.  . fluorometholone (FML) 0.1 % ophthalmic suspension   . Insulin Glargine, 1 Unit Dial, (TOUJEO SOLOSTAR) 300 UNIT/ML SOPN Inject 85 Units into the skin 2 (two) times daily.  . insulin lispro (HUMALOG KWIKPEN) 100 UNIT/ML KwikPen Inject 15-25 Units into the skin See admin instructions. Inject 15 units subcutaneously after breakfast and 25 units after lunch and supper  . losartan (COZAAR) 100 MG tablet Take 100 mg by mouth daily.  . Lutein-Zeaxanthin 25-5 MG CAPS Take 1 tablet by mouth daily.  . metFORMIN (GLUCOPHAGE) 1000 MG tablet Take 1,000 mg by mouth 2 (two) times daily with a meal.  . mirtazapine (REMERON) 7.5 MG tablet mirtazapine 7.5 mg tablet  . Multiple Vitamin (MULTIVITAMIN WITH MINERALS) TABS tablet Take 1 tablet by mouth daily.  . mupirocin ointment (BACTROBAN) 2 % Apply 1 application topically 2 (two) times daily.  . Omega-3 Fatty Acids (FISH OIL) 1000 MG CAPS Take 2,000 mg by mouth daily. Reported on 02/13/2016  . ONETOUCH VERIO test strip   . Polyethyl Glycol-Propyl Glycol (SYSTANE OP) Place 1-2 drops into both eyes 4 (four) times daily as needed (dry eyes).   Marland Kitchen PRESCRIPTION MEDICATION Inhale into the lungs at bedtime. CPAP  . RESTASIS 0.05 % ophthalmic emulsion   . SURE COMFORT PEN NEEDLES 31G X 5 MM MISC   . [DISCONTINUED] hydrochlorothiazide (HYDRODIURIL) 25 MG tablet Take 25 mg by mouth daily.     Allergies:   Melatonin   Social History   Socioeconomic History  . Marital status: Married    Spouse name: [name]  . Number of children: 2  . Years of education: Bachelors   . Highest  education level: Bachelor's degree (e.g., BA, AB, BS)  Occupational History  . Occupation: retired    Fish farm manager: [employer]    Comment: teaching/coaching  Tobacco Use  . Smoking status: Former Smoker    Packs/day: 1.00    Years: 10.00    Pack years: 10.00    Types: Cigarettes    Quit date: 11/25/1983    Years since quitting: 36.9  . Smokeless tobacco: Never Used  Vaping Use  . Vaping Use: Never used  Substance and Sexual Activity  . Alcohol use: Yes    Alcohol/week: 2.0 standard drinks    Types: 2 Standard drinks or equivalent per week    Comment: occassionally  .  Drug use: No  . Sexual activity: Not on file  Other Topics Concern  . Not on file  Social History Narrative   Patient lives at home with wife. [name](   Patient has 2 children that are in good health.    Patient works for [employer]. Retired .   Patient has a Bachelors degree in History.    Social Determinants of Health   Financial Resource Strain:   . Difficulty of Paying Living Expenses: Not on file  Food Insecurity:   . Worried About Charity fundraiser in the Last Year: Not on file  . Ran Out of Food in the Last Year: Not on file  Transportation Needs:   . Lack of Transportation (Medical): Not on file  . Lack of Transportation (Non-Medical): Not on file  Physical Activity:   . Days of Exercise per Week: Not on file  . Minutes of Exercise per Session: Not on file  Stress:   . Feeling of Stress : Not on file  Social Connections:   . Frequency of Communication with Friends and Family: Not on file  . Frequency of Social Gatherings with Friends and Family: Not on file  . Attends Religious Services: Not on file  . Active Member of Clubs or Organizations: Not on file  . Attends Archivist Meetings: Not on file  . Marital Status: Not on file     Family History: The patient's family history includes Alzheimer's disease in his father; Cerebral aneurysm in his mother; Diabetes  in his maternal grandmother; Healthy in his son; Tremor in his brother, maternal uncle, and mother. There is no history of Colon cancer.  ROS:   Please see the history of present illness.     All other systems reviewed and are negative.  EKGs/Labs/Other Studies Reviewed:    The following studies were reviewed today:   EKG:  EKG is not ordered today.  EKG ordered at prior clinic visit shows sinus rhythm, first-degree AV block, rate 85, PVC, left axis deviation, Q waves V1-3 and V6  Cardiac MRI 10/07/19: 1. Limited study, as only a few sequences were able to be completed due to limitations from presence of deep brain stimulator 2. Basal septal hypertrophy measuring up to 27mm (lateral wall 12 mm), consistent with hypertrophic cardiomyopathy 3. Patchy late gadolinium enhancement in basal septum, consistent with HCM 4. Basal inferolateral midwall LGE, which would not be a typical pattern for HCM, as more commonly seen in setting of prior myocarditis or sarcoidosis. 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  Normal LV size with hyperdynamic systolic function (EF 23%) 6.  Normal RV size and systolic function (EF 53%)  TTE 08/29/19: 1. Technically difficult study. Left ventricular ejection fraction appears grossly normal, approximately 55-60%, though difficult visualization even with contrast 2. There is asymmetric basal septal hypertrophy measuring 18 mm in basal septum (12 mm posterior wall). Consider cardiac MRI to assess for hypertrophic cardiomyopathy if clinically indicated 3. Definity contrast agent was given IV to delineate the left ventricular endocardial borders. 4. Global right ventricle has normal systolic function.The right ventricular size is normal. No increase in right ventricular wall thickness. 5. There is mild dilatation of the aortic root measuring 39 mm. 6. The inferior vena cava is dilated in size with <50%  respiratory variability, suggesting right atrial pressure of 15 mmHg.  PYP scan 10/25/19:  The study is normal.  No evidence of TTR amyloidosis.   Cardiac  monitor 11/11/19:  No significant abnormalities  No atrial fibrillation. No VT  Occasional PVCs (1.3% of beats).  1 patient triggered event, which appears to correspond to short pause (1.1 seconds) from a blocked PA   Predominant rhythm is sinus rhythm. Range is 57 to 137 bpm with average of 89 bpm. No atrial fibrillation, sustained ventricular tachycardia, significant pause, or high degree AV block. Occasional PVCs (1.3% of beats). 1 patient triggered events. Triggered event appears to correspond to short pause (1.1 seconds) from a blocked PAC.  No significant abnormalities.   Recent Labs: 12/15/2019: BUN 26; Creatinine, Ser 1.57; Hemoglobin 12.8; Magnesium 1.7; Platelets 263; Potassium 4.2; Sodium 139  Recent Lipid Panel    Component Value Date/Time   CHOL 183 08/29/2019 0409   TRIG 249 (H) 08/29/2019 0409   HDL 26 (L) 08/29/2019 0409   CHOLHDL 7.0 08/29/2019 0409   VLDL 50 (H) 08/29/2019 0409   LDLCALC 107 (H) 08/29/2019 0409    Physical Exam:    VS:  BP 124/86   Pulse 98   Ht 6\' 4"  (1.93 m)   Wt 255 lb 12.8 oz (116 kg)   SpO2 98%   BMI 31.14 kg/m     Wt Readings from Last 3 Encounters:  10/24/20 255 lb 12.8 oz (116 kg)  07/26/20 253 lb 3.2 oz (114.9 kg)  04/25/20 254 lb (115.2 kg)     GEN: Well nourished, well developed in no acute distress HEENT: Normal NECK: No JVD LYMPHATICS: No lymphadenopathy CARDIAC: RRR, no murmurs, rubs, gallops RESPIRATORY:  Clear to auscultation without rales, wheezing or rhonchi  ABDOMEN: Soft, non-tender, non-distended MUSCULOSKELETAL:  No edema; No deformity  SKIN: Warm and dry NEUROLOGIC:  Alert and oriented x 3 PSYCHIATRIC:  Normal affect   ASSESSMENT:    1. Hypertrophic cardiomyopathy (Joshua [name])   2. Essential hypertension   3. Hyperlipidemia, unspecified  hyperlipidemia type    PLAN:    Hypertrophic cardiomyopathy: 17mm (lateral wall 12 mm) on CMR.  In addition, had unusual scar pattern for HCM, with basal inferolateral scar on MRI.  Fabry's disease can be associated with this scar pattern and asymmetric hypertrophy, so alpha galactosidase was checked and was normal. Scar pattern not c/w amyloid on CMR, but unfortunately unable to do T1 mapping to r/o amyloid due to DBS.  No evidence of amyloid on PYP scan.  Scar pattern may represent prior episode of myocarditis.  Suspect HCM.  No obstruction.  Recommended screening of first-degree relatives with TTE, he reports that he has discussed with his sons.  Cardiac monitor on 08/22/2020 showed 1 4 beat run of NSVT, 6 runs of SVT longest lasting 16 beats, occasional PVCs (4%).   Preop evaluation: Prior to right knee replacement.  No anginal symptoms.  Good functional capacity (greater than 4 METS).  RCRI score 2 given history of CVA, IDDM, which corresponds to 10% 30-day risk of MI, cardiac arrest, or death.  No further cardiac work-up recommended prior to surgery.  Lightheadedness: Reports recent issues with lightheadedness with standing.  Does have known vertigo, which may be contributing, but symptoms of lightheadedness have been different than normal vertigo symptoms where he feels like room is spinning.  Orthostatics in prior clinic visit showed no drop in blood pressure with standing.   -Continues to have lightheadedness with standing, concerned this may be related to his HCTZ in the setting of his HCM.  Will discontinue HCTZ.  Asked patient to monitor BP twice daily for next week and let us  know resolved, as may need additional antihypertensive with stopping HCTZ  CVA: Subclavian artery thrombosis diagnosed, unclear if cardioembolic source or developed in situ.  Currently on Eliquis.  No AF on cardiac monitor.  Hypertension: On amlodipine 5 mg daily, hydrochlorothiazide 25 mg daily, losartan 100 mg daily.   Will stop HCTZ as above.  Asked patient to monitor BP twice daily for next week, may need additional antihypertensive  Hyperlipidemia: On atorvastatin 80 mg daily.  LDL 107 on 08/29/2019.  He is having labs checked with PCP this month, will follow up results.  Type 2 diabetes: A1c 7.9%.  On insulin  PVCs: occasional (1.3%) on monitor.  He is asymptomatic and with normal LV systolic function, no treatment indicated  Dysphagia: Reports that he has been choking on liquids since his CVA.  Referred to sleep therapy for evaluation  RTC in 3 months   Medication Adjustments/Labs and Tests Ordered: Current medicines are reviewed at length with the patient today.  Concerns regarding medicines are outlined above.  Orders Placed This Encounter  Procedures  . Comprehensive metabolic panel  . CBC  . Magnesium  . Lipid panel  . TSH   No orders of the defined types were placed in this encounter.   Patient Instructions  Medication Instructions:  STOP HCTZ  *If you need a refill on your cardiac medications before your next appointment, please call your pharmacy*   Lab Work: CMET, CBC, TSH, Lipid, Mag today  If you have labs (blood work) drawn today and your tests are completely normal, you will receive your results only by: Marland Kitchen MyChart Message (if you have MyChart) OR . A paper copy in the mail If you have any lab test that is abnormal or we need to change your treatment, we will call you to review the results.  Follow-Up: At [facility], you and your health needs are our priority.  As part of our continuing mission to provide you with exceptional heart care, we have created designated Provider Care Teams.  These Care Teams include your primary Cardiologist (physician) and Advanced Practice Providers (APPs -  Physician Assistants and Nurse Practitioners) who all work together to provide you with the care you need, when you need it.  We recommend signing up for the patient portal called  "MyChart".  Sign up information is provided on this After Visit Summary.  MyChart is used to connect with patients for Virtual Visits (Telemedicine).  Patients are able to view lab/test results, encounter notes, upcoming appointments, etc.  Non-urgent messages can be sent to your provider as well.   To learn more about what you can do with MyChart, go to [email or web address].    Your next appointment:   3 month(s)  The format for your next appointment:   In Person  Provider:   Oswaldo Milian, MD   Other Instructions Please check your blood pressure 2 times daily for the next week, write it down.  Call the office or send message via Mychart with the readings for Dr. Gardiner Rhyme to review.       Signed, Donato Heinz, MD  10/24/2020 11:25 AM    [place]

## 2020-10-22 NOTE — Progress Notes (Signed)
Assessment/Plan:   1.  Essential Tremor.  -The patient is status post left VIM DBS on July 07, 2019 with [facility] device.  Intention was originally to do a bilateral VIM DBS, but the patient had a vagal episode in the operating room with associated nausea and vomiting with positive CSF, and it was decided not to attempt the other side.  Ultimately, the patients other side really has been fairly well controlled and we have decided to leave him with unilateral DBS.  -Patient programming done today.  Following programming, the patient had much better control with the tremor.  However, he was still ataxic.  Gave him 2 different programs and told him to try both programs and see if he was still ataxic on both programs (not sure if it is actually a programming issue).  Told him to give it a week on both programs.  I am also going to send him to the neuro rehab center for physical therapy.  I told him he likely needs an ambulatory assistive device.  2.  History of cerebral infarct, October, 2020  -MRI demonstrated acute right posterior limb of the internal capsule infarct. However, stroke neurology felt that symptoms were more consistent with a posterior circulation stroke, related to thrombus in the left subclavian(although patient certainly does have mild left facial droop today).   -Patient does continue to have subclavian thrombus on December 21, 2019 CTA neck.  Patient on Eliquis.  -Patient on Lipitor.  Primary care following.  Lipitor 80 mg (40 mg at time of event).  Goal LDL less than 70.  I do not have a copy of recent labs from this year.  -Discussed importance of diet.  Goal for his A1c is less than 7.  Again, primary care is following these numbers.  I have not gotten a copy of his recent labs, which were all drawn through primary care.  -Swallowing evaluation in October, 2021 was unremarkable.  3.  Dysphagia  -Modified barium on December 06, 2019 was normal.  4.  Hypertrophic  cardiomyopathy  -Following with cardiology.  On Eliquis.  5.  Lightheadedness  -Orthostatics negative  -Saw cardiology and had a zio patch x 7 days that was not revealing.  Subjective:   Joshua [name] was seen in follow-up in the movement disorder clinic for essential tremor.  Patient called in September stating that he was having more tremor.  Patient was asked if he tried his second program, as he has 2 separate programs within his DBS device.  He had not tried the second program, and he was asked to do so and call us back the following morning.  We have not heard from him since.  Today, he states that he changed it but he thinks that it got worse with the change.  He notes tremor in the leg some.  He also notes concern with balance and isn't sure if that got worse with the change.   He saw cardiology in September and yesterday.  He was having lightheadedness so a 7 day zio patch was completed.  This did not reveal any bradycardia arrhythmias.  His orthostatics were negative.  He did have a swallowing evaluation in October, 2021, ordered by Dr. Gardiner Rhyme for coughing/choking.  I have reviewed that.  This was generally unremarkable.  Regular diet with thin liquids recommended.  Pt does state that cardiology thought yesterday that HCTZ could be contributing to dizziness and he is going to hold that.  Allergies  Allergen Reactions  .  Melatonin Other (See Comments)    dizzy    Current Outpatient Medications  Medication Instructions  . acetic acid-hydrocortisone (VOSOL-HC) OTIC solution hydrocortisone-acetic acid 1 %-2 % ear drops  . amLODipine (NORVASC) 5 mg, Oral, Daily at bedtime  . atorvastatin (LIPITOR) 80 mg, Oral, Daily-1800  . DULoxetine (CYMBALTA) 60 mg, Oral, Daily  . ELIQUIS 5 MG TABS tablet TAKE 1 TABLET BY MOUTH TWICE DAILY.  . fenofibrate 54 mg, Oral, Daily  . Fish Oil 2,000 mg, Oral, Daily, Reported on 02/13/2016  . fluorometholone (FML) 0.1 % ophthalmic suspension No dose,  route, or frequency recorded.  . insulin lispro (HUMALOG KWIKPEN) 15-25 Units, Subcutaneous, See admin instructions, Inject 15 units subcutaneously after breakfast and 25 units after lunch and supper  . losartan (COZAAR) 100 mg, Oral, Daily  . Lutein-Zeaxanthin 25-5 MG CAPS 1 tablet, Oral, Daily  . metFORMIN (GLUCOPHAGE) 1,000 mg, Oral, 2 times daily with meals  . mirtazapine (REMERON) 7.5 MG tablet mirtazapine 7.5 mg tablet  . Multiple Vitamin (MULTIVITAMIN WITH MINERALS) TABS tablet 1 tablet, Oral, Daily  . mupirocin ointment (BACTROBAN) 2 % 1 application, Topical, 2 times daily  . ONETOUCH VERIO test strip No dose, route, or frequency recorded.  Vladimir Faster Glycol-Propyl Glycol (SYSTANE OP) 1-2 drops, Both Eyes, 4 times daily PRN  . PRESCRIPTION MEDICATION Inhalation, Daily at bedtime, CPAP  . RESTASIS 0.05 % ophthalmic emulsion No dose, route, or frequency recorded.  . SURE COMFORT PEN NEEDLES 31G X 5 MM MISC No dose, route, or frequency recorded.  Nelva Nay SoloStar 85 Units, Subcutaneous, 2 times daily     Objective:   VITALS:   Vitals:   10/25/20 0844  BP: 124/78  Pulse: 90  SpO2: 99%  Weight: 258 lb (117 kg)  Height: 6\' 4"  (1.93 m)   Gen:  Appears stated age and in NAD. HEENT:  Normocephalic, atraumatic. The mucous membranes are moist.    NEUROLOGICAL:  Orientation:  The patient is alert and oriented x 3.   Cranial nerves: There is good facial symmetry. Extraocular muscles are intact and visual fields are full to confrontational testing. Speech is fluent and mildly dysarthric. Soft palate rises symmetrically and there is no tongue deviation. Hearing is intact to conversational tone. Tone: Tone is good throughout. Sensation: Sensation is intact to light touch touch throughout  Coordination:  The patient has no dysdiadichokinesia or dysmetria. Motor: Strength is 5/5 in the bilateral upper and lower extremities.  Shoulder shrug is equal bilaterally.   Gait and Station: The  patient is ataxic.  MOVEMENT EXAM: Tremor: Prior to programming, there is mild postural tremor on the left (no DBS on that side).  There is no postural tremor on the right.  When given an object to hold, he has some mild to moderate tremor when it approximates his mouth, especially if the object is light (was given a pen and a tuning fork).  Following programming, this is much improved.  I have reviewed and interpreted the following labs independently   Chemistry      Component Value Date/Time   NA 144 10/24/2020 0943   K 4.8 10/24/2020 0943   CL 105 10/24/2020 0943   CO2 26 10/24/2020 0943   BUN 29 (H) 10/24/2020 0943   CREATININE 1.66 (H) 10/24/2020 0943      Component Value Date/Time   CALCIUM 9.1 10/24/2020 0943   ALKPHOS 118 10/24/2020 0943   AST 15 10/24/2020 0943   ALT 17 10/24/2020 0943   BILITOT 0.4  10/24/2020 0943      Lab Results  Component Value Date   WBC 7.1 10/24/2020   HGB 13.8 10/24/2020   HCT 40.4 10/24/2020   MCV 88 10/24/2020   PLT 237 10/24/2020   Lab Results  Component Value Date   TSH 2.050 10/24/2020      Total time spent on today's visit was 30 minutes, including both face-to-face time and nonface-to-face time.  Time included that spent on review of records (prior notes available to me/labs/imaging if pertinent), discussing treatment and goals, answering patient's questions and coordinating care.  This did not include DBS time, which is described in more detail on separate programming procedural note.  CC:  [name], MD

## 2020-10-23 ENCOUNTER — Ambulatory Visit: Payer: Medicare PPO | Admitting: Podiatry

## 2020-10-23 ENCOUNTER — Other Ambulatory Visit: Payer: Self-pay

## 2020-10-23 DIAGNOSIS — L97529 Non-pressure chronic ulcer of other part of left foot with unspecified severity: Secondary | ICD-10-CM | POA: Diagnosis not present

## 2020-10-23 DIAGNOSIS — E11621 Type 2 diabetes mellitus with foot ulcer: Secondary | ICD-10-CM

## 2020-10-23 DIAGNOSIS — M216X9 Other acquired deformities of unspecified foot: Secondary | ICD-10-CM

## 2020-10-23 DIAGNOSIS — L84 Corns and callosities: Secondary | ICD-10-CM | POA: Diagnosis not present

## 2020-10-23 DIAGNOSIS — E119 Type 2 diabetes mellitus without complications: Secondary | ICD-10-CM | POA: Diagnosis not present

## 2020-10-23 NOTE — Progress Notes (Signed)
Subjective: 69 year old male presents the office today for diabetic foot evaluation.  States he is doing well although he gets some occasional discomfort in the bottom of the right foot submetatarsal 1 and 5.  Denies any open sores.  No significant callus formation.  He does get pedicures on a regular basis. Denies any systemic complaints such as fevers, chills, nausea, vomiting. No acute changes since last appointment, and no other complaints at this time.   Objective: AAO x3, NAD DP/PT pulses palpable bilaterally, CRT less than 3 seconds Sensation decreased with Semmes Weinstein monofilament  Cavus foot type is present resulting in plantarflexed first ray and prominent fifth metatarsal head resulting in minimal hyperkeratotic tissue.  There is no ulcerations identified.  There is no edema, erythema.  Spurring present the dorsal midfoot from arthritic changes.  No other areas of discomfort.  MMT 5/5.  No pain with calf compression, swelling, warmth, erythema  Assessment: 69 year old male with cavus foot type resulting in preulcerative calluses  Plan: -All treatment options discussed with the patient including all alternatives, risks, complications.  -I modified his orthotics in order to take pressure off submetatarsal 1 and 5.  Reviewed awaiting new diabetic shoes, inserts.  Discussed daily foot inspection. -Glucose control  -Patient encouraged to call the office with any questions, concerns, change in symptoms.    RTC 3 months  Trula Slade DPM

## 2020-10-24 ENCOUNTER — Ambulatory Visit: Payer: Medicare PPO | Admitting: Cardiology

## 2020-10-24 ENCOUNTER — Encounter: Payer: Self-pay | Admitting: Cardiology

## 2020-10-24 VITALS — BP 124/86 | HR 98 | Ht 76.0 in | Wt 255.8 lb

## 2020-10-24 DIAGNOSIS — I422 Other hypertrophic cardiomyopathy: Secondary | ICD-10-CM | POA: Diagnosis not present

## 2020-10-24 DIAGNOSIS — R42 Dizziness and giddiness: Secondary | ICD-10-CM

## 2020-10-24 DIAGNOSIS — E785 Hyperlipidemia, unspecified: Secondary | ICD-10-CM | POA: Diagnosis not present

## 2020-10-24 DIAGNOSIS — Z01818 Encounter for other preprocedural examination: Secondary | ICD-10-CM | POA: Diagnosis not present

## 2020-10-24 DIAGNOSIS — I10 Essential (primary) hypertension: Secondary | ICD-10-CM | POA: Diagnosis not present

## 2020-10-24 LAB — CBC
Hematocrit: 40.4 % (ref 37.5–51.0)
Hemoglobin: 13.8 g/dL (ref 13.0–17.7)
MCH: 30.1 pg (ref 26.6–33.0)
MCHC: 34.2 g/dL (ref 31.5–35.7)
MCV: 88 fL (ref 79–97)
Platelets: 237 10*3/uL (ref 150–450)
RBC: 4.59 x10E6/uL (ref 4.14–5.80)
RDW: 12.7 % (ref 11.6–15.4)
WBC: 7.1 10*3/uL (ref 3.4–10.8)

## 2020-10-24 LAB — LIPID PANEL
Chol/HDL Ratio: 5.4 ratio — ABNORMAL HIGH (ref 0.0–5.0)
Cholesterol, Total: 161 mg/dL (ref 100–199)
HDL: 30 mg/dL — ABNORMAL LOW (ref >39)
LDL Chol Calc (NIH): 99 mg/dL (ref 0–99)
Triglycerides: 180 mg/dL — ABNORMAL HIGH (ref 0–149)
VLDL Cholesterol Cal: 32 mg/dL (ref 5–40)

## 2020-10-24 LAB — COMPREHENSIVE METABOLIC PANEL
ALT: 17 IU/L (ref 0–44)
AST: 15 IU/L (ref 0–40)
Albumin/Globulin Ratio: 1.6 (ref 1.2–2.2)
Albumin: 4.1 g/dL (ref 3.7–4.7)
Alkaline Phosphatase: 118 IU/L (ref 44–121)
BUN/Creatinine Ratio: 17 (ref 10–24)
BUN: 29 mg/dL — ABNORMAL HIGH (ref 8–27)
Bilirubin Total: 0.4 mg/dL (ref 0.0–1.2)
CO2: 26 mmol/L (ref 20–29)
Calcium: 9.1 mg/dL (ref 8.6–10.2)
Chloride: 105 mmol/L (ref 96–106)
Creatinine, Ser: 1.66 mg/dL — ABNORMAL HIGH (ref 0.76–1.27)
GFR calc Af Amer: 47 mL/min/{1.73_m2} — ABNORMAL LOW (ref >59)
GFR calc non Af Amer: 41 mL/min/{1.73_m2} — ABNORMAL LOW (ref >59)
Globulin, Total: 2.6 g/dL (ref 1.5–4.5)
Glucose: 172 mg/dL — ABNORMAL HIGH (ref 65–99)
Potassium: 4.8 mmol/L (ref 3.5–5.2)
Sodium: 144 mmol/L (ref 134–144)
Total Protein: 6.7 g/dL (ref 6.0–8.5)

## 2020-10-24 LAB — MAGNESIUM: Magnesium: 1.8 mg/dL (ref 1.6–2.3)

## 2020-10-24 LAB — TSH: TSH: 2.05 u[IU]/mL (ref 0.450–4.500)

## 2020-10-24 NOTE — Patient Instructions (Signed)
Medication Instructions:  STOP HCTZ  *If you need a refill on your cardiac medications before your next appointment, please call your pharmacy*   Lab Work: CMET, CBC, TSH, Lipid, Mag today  If you have labs (blood work) drawn today and your tests are completely normal, you will receive your results only by: Marland Kitchen MyChart Message (if you have MyChart) OR . A paper copy in the mail If you have any lab test that is abnormal or we need to change your treatment, we will call you to review the results.  Follow-Up: At [facility], you and your health needs are our priority.  As part of our continuing mission to provide you with exceptional heart care, we have created designated Provider Care Teams.  These Care Teams include your primary Cardiologist (physician) and Advanced Practice Providers (APPs -  Physician Assistants and Nurse Practitioners) who all work together to provide you with the care you need, when you need it.  We recommend signing up for the patient portal called "MyChart".  Sign up information is provided on this After Visit Summary.  MyChart is used to connect with patients for Virtual Visits (Telemedicine).  Patients are able to view lab/test results, encounter notes, upcoming appointments, etc.  Non-urgent messages can be sent to your provider as well.   To learn more about what you can do with MyChart, go to [email or web address].    Your next appointment:   3 month(s)  The format for your next appointment:   In Person  Provider:   Oswaldo Milian, MD   Other Instructions Please check your blood pressure 2 times daily for the next week, write it down.  Call the office or send message via Mychart with the readings for Dr. Gardiner Rhyme to review.

## 2020-10-25 ENCOUNTER — Ambulatory Visit: Payer: Medicare PPO | Admitting: Neurology

## 2020-10-25 ENCOUNTER — Other Ambulatory Visit: Payer: Self-pay

## 2020-10-25 ENCOUNTER — Encounter: Payer: Self-pay | Admitting: Neurology

## 2020-10-25 VITALS — BP 124/78 | HR 90 | Ht 76.0 in | Wt 258.0 lb

## 2020-10-25 DIAGNOSIS — R270 Ataxia, unspecified: Secondary | ICD-10-CM

## 2020-10-25 DIAGNOSIS — H6240 Otitis externa in other diseases classified elsewhere, unspecified ear: Secondary | ICD-10-CM | POA: Diagnosis not present

## 2020-10-25 DIAGNOSIS — Z79899 Other long term (current) drug therapy: Secondary | ICD-10-CM

## 2020-10-25 DIAGNOSIS — G250 Essential tremor: Secondary | ICD-10-CM | POA: Diagnosis not present

## 2020-10-25 DIAGNOSIS — B369 Superficial mycosis, unspecified: Secondary | ICD-10-CM | POA: Diagnosis not present

## 2020-10-25 NOTE — Procedures (Signed)
DBS Programming was performed.    Manufacturer of DBS device: [employer] was performed.    Manufacturer of DBS device: [employer]  Total time spent programming was 40 minutes.  Device was confirmed to be on.  Soft start was confirmed to be on.  Impedences were checked and were within normal limits.  Battery was checked and was determined to be functioning normally and not near the end of life.  Final settings were as follows, with program 2 being active:   Active Contacts Amplitude (mA) PW (ms) Frequency (hz)   Left Brain       Program 1       11/28/2019 5-C+ 2.5 60 130   04/25/20 5-C+ 2.7 60 130   10/25/20 5-C+ 3.5(2.1-3.8) 60 130          Program 2       11/28/19 5-8+ 2.2 60 130   04/25/20 5-8+ 3.2 60 130                         10/25/20 5-8+ 3.6 (3.0-4.0) 70 130                                      Right Brain       Not active                        Prior visits:  Monopolar review: Left brain electrode:     1-C+           ; Amplitude  1.0   ma   ; Pulse width 60 microseconds;   Frequency   130   Hz.  (Tongue paresthesias) Left brain electrode:     (2-3-4-) 33% each C+           ; Amplitude  2.0   ma   ; Pulse width 60 microseconds;   Frequency   130   Hz.  (Tongue and lip paresthesias, fairly good tremor control) Left brain electrode:     (5-6-7-) 33% C+           ; Amplitude  2.0   ma   ; Pulse width 60 microseconds;   Frequency   130   Hz.  (Tongue paresthesias but no lip paresthesia and good tremor control) Left brain electrode:     8-C+           ; Amplitude  1.0   ma   ; Pulse width 60 microseconds;   Frequency   130   Hz.  (Paresthesias resolved)

## 2020-10-25 NOTE — Patient Instructions (Signed)
You have been referred to [facility] for therapy. They will call you directly to schedule an appointment.  Please call [phone] if you do not hear from them.   

## 2020-10-30 ENCOUNTER — Telehealth: Payer: Self-pay | Admitting: Cardiology

## 2020-10-30 NOTE — Telephone Encounter (Signed)
Given his lightheadedness resolved with stopping the HCTZ, I would favor stopping HCTZ and increasing his amlodipine to 10 mg daily.  Would ask to check BP twice daily for next 2 weeks and call with results

## 2020-10-30 NOTE — Telephone Encounter (Signed)
Spoke with pt, Saturday he felt really bad and his bp was 202 which scared him and so he restarted the HCTZ 25 mg once daily. He has continued to take the HCTZ as his bp is still in the 140's. The lightheadedness he was having went away when he stopped the HCTZ but the elevated bp scares him so he restarted. Will make dr Gardiner Rhyme aware.

## 2020-10-30 NOTE — Telephone Encounter (Signed)
    Pt c/o medication issue:  1. Name of Medication: hydrochlorothiazide  2. How are you currently taking this medication (dosage and times per day)?  3. Are you having a reaction (difficulty breathing--STAT)?   4. What is your medication issue? Pt would like for Dr. Gardiner Rhyme know he is taking hydrochlorothiazide again. He said his BP spiked and it scared him so he took hydrochlorothiazide again

## 2020-10-31 ENCOUNTER — Other Ambulatory Visit: Payer: Self-pay

## 2020-10-31 ENCOUNTER — Encounter: Payer: Self-pay | Admitting: Physical Therapy

## 2020-10-31 ENCOUNTER — Ambulatory Visit: Payer: Medicare PPO | Attending: Neurology | Admitting: Physical Therapy

## 2020-10-31 DIAGNOSIS — R2689 Other abnormalities of gait and mobility: Secondary | ICD-10-CM | POA: Diagnosis not present

## 2020-10-31 DIAGNOSIS — R2681 Unsteadiness on feet: Secondary | ICD-10-CM | POA: Insufficient documentation

## 2020-10-31 DIAGNOSIS — R262 Difficulty in walking, not elsewhere classified: Secondary | ICD-10-CM | POA: Diagnosis not present

## 2020-10-31 DIAGNOSIS — M6281 Muscle weakness (generalized): Secondary | ICD-10-CM | POA: Diagnosis not present

## 2020-10-31 MED ORDER — AMLODIPINE BESYLATE 10 MG PO TABS: 10.0000 mg | ORAL_TABLET | Freq: Every day | ORAL | 3 refills | Status: DC

## 2020-10-31 NOTE — Therapy (Signed)
[facility] [address] [place], [place], [address] Phone: [phone]   Fax:  [phone]  Physical Therapy Evaluation  Patient Details  Name: Joshua [name] MRN: [id number] Date of Birth: 12/03/1950 Referring Provider (PT): Tat, Eustace Quail, DO   Encounter Date: 10/31/2020   PT End of Session - 10/31/20 1236    Visit Number 1    Number of Visits 13    Date for PT Re-Evaluation 12/12/20    Authorization Type [employer] -- auth requested 10/31/20    Progress Note Due on Visit 10    PT Start Time 2119    PT Stop Time 1234    PT Time Calculation (min) 49 min    Equipment Utilized During Treatment Gait belt    Activity Tolerance Patient tolerated treatment well    Behavior During Therapy [facility] for tasks assessed/performed           Past Medical History:  Diagnosis Date  . Benign essential tremor   . Benign positional vertigo   . CVA (cerebral vascular accident) ([place])    x2 - L retina, 1 right parietal  . Degenerative arthritis   . Depression   . Diabetes mellitus   . Dyslipidemia   . GERD (gastroesophageal reflux disease)    hiatal hernia  . Gout   . H/O: vasectomy   . Hearing aid worn    b/l  . Hx of appendectomy   . Hx of tonsillectomy   . Hypertension   . 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 Melanoma ([place])   . Obesity   . OSA on CPAP    setting = 5  . Tremor, essential 06/22/2017  . Wears glasses     Past Surgical History:  Procedure Laterality Date  . APPENDECTOMY    . arthroscopic knee surgery Bilateral   . CATARACT EXTRACTION Bilateral   . COLONOSCOPY    . MINOR PLACEMENT OF FIDUCIAL N/A 06/30/2019   Procedure: Fiducial placement;  Surgeon: Erline Levine, MD;  Location: [facility];  Service: Neurosurgery;  Laterality: N/A;  Fiducial placement  . NASAL SEPTUM SURGERY    . PULSE GENERATOR IMPLANT N/A 07/14/2019   Procedure: Left cranial Implanted Pulse Generator and lead extension  placement to right chest ;  Surgeon: Erline Levine, MD;  Location: [facility];  Service: Neurosurgery;  Laterality: N/A;  . SUBTHALAMIC STIMULATOR INSERTION Left 07/07/2019   Procedure: LEFT DEEP BRAIN STIMULATOR PLACEMENT;  Surgeon: Erline Levine, MD;  Location: [facility];  Service: Neurosurgery;  Laterality: Left;  . TONSILLECTOMY    . TOTAL KNEE ARTHROPLASTY Left 03/30/2017   Procedure: LEFT TOTAL KNEE ARTHROPLASTY;  Surgeon: Paralee Cancel, MD;  Location: [facility];  Service: Orthopedics;  Laterality: Left;  . WISDOM TOOTH EXTRACTION      There were no vitals filed for this visit.    Subjective Assessment - 10/31/20 1151    Subjective Pt reports that back in Oct 2020 he had a CVA, over time he has noticed that he gets "choked" on liquids easily (he has had swallow studies done and using a straw to help with this), and increased unsteadiness. Pt feels that his legs from knee down don't cooperate as well as he wants them. He reports he has visibly noticed that his feet get caught on rugs especially when barefooted. He has seen Dr. Jacqualyn Posey podiatrist and was given exercises for his feet.    Limitations Standing;Walking;House hold activities  How long can you sit comfortably? n/a    How long can you stand comfortably? n/a    How long can you walk comfortably? n/a    Patient Stated Goals Decrease falls and improve steadiness    Currently in Pain? No/denies              [facility] PT Assessment - 10/31/20 0001      Assessment   Medical Diagnosis R27.0 (ICD-10-CM) - Ataxia    Referring Provider (PT) Tat, Eustace Quail, DO    Onset Date/Surgical Date 09/01/19    Prior Therapy BPPV ~10 yrs ago      Precautions   Precautions Fall      Balance Screen   Has the patient fallen in the past 6 months Yes    How many times? 5   2 of them in the yard, 1 getting out of the truck, 2 in the    Has the patient had a decrease in activity level because of a fear of falling?  Yes    Is the patient reluctant to leave  their home because of a fear of falling?  No      Home Ecologist residence    Living Arrangements Spouse/significant other    Available Help at Discharge Family    Type of [employer] to enter    Entrance Stairs-Number of Steps 4   wide and not as high   Entrance Stairs-Rails Can reach both;Right;Left    Additional Comments Wife had polio and in braces      Prior Function   Level of Independence Independent    Vocation Retired    Leisure Substituting at school, read      Praxair Impaired by gross assessment   Decreased sensation bilat ankle to feet     Strength   Right Hip Flexion 4/5    Right Hip External Rotation  4+/5    Right Hip Internal Rotation 4+/5    Right Hip ABduction 4/5    Right Hip ADduction 5/5    Left Hip Flexion 4-/5    Left Hip External Rotation 4-/5    Left Hip Internal Rotation 4-/5    Left Hip ABduction 4/5    Left Hip ADduction 5/5    Right Knee Flexion 4+/5    Right Knee Extension 5/5    Left Knee Flexion 4-/5    Left Knee Extension 4+/5    Right Ankle Dorsiflexion 4-/5    Right Ankle Plantar Flexion 4+/5    Right Ankle Inversion 5/5    Right Ankle Eversion 5/5    Left Ankle Dorsiflexion 4-/5    Left Ankle Plantar Flexion 4+/5    Left Ankle Inversion 5/5    Left Ankle Eversion 5/5      Ambulation/Gait   Ambulation Distance (Feet) 100 Feet    Assistive device None    Gait Pattern Step-through pattern;Decreased stride length;Decreased dorsiflexion - right;Decreased dorsiflexion - left;Right foot flat;Left foot flat;Poor foot clearance - left;Poor foot clearance - right;Ataxic    Ambulation Surface Level;Indoor    Gait velocity 10.59 sec = 3.1 ft/sec      Standardized Balance Assessment   Standardized Balance Assessment Berg Balance Test;Five Times Sit to Stand;Timed Up and Go Test    Five times sit to stand comments  29.16      Berg Balance Test   Sit to Stand Able to stand  independently using hands    Standing Unsupported Able to stand safely 2 minutes    Sitting with Back Unsupported but Feet Supported on Floor or Stool Able to sit safely and securely 2 minutes    Stand to Sit Controls descent by using hands    Transfers Able to transfer safely, definite need of hands    Standing Unsupported with Eyes Closed Able to stand 10 seconds with supervision    Standing Unsupported with Feet Together Needs help to attain position but able to stand for 30 seconds with feet together    From Standing, Reach Forward with Outstretched Arm Can reach confidently >25 cm (10")    From Standing Position, Pick up Object from Floor Able to pick up shoe, needs supervision    From Standing Position, Turn to Look Behind Over each Shoulder Looks behind one side only/other side shows less weight shift    Turn 360 Degrees Able to turn 360 degrees safely but slowly   6 sec   Standing Unsupported, Alternately Place Feet on Step/Stool Able to stand independently and safely and complete 8 steps in 20 seconds    Standing Unsupported, One Foot in Front Able to take small step independently and hold 30 seconds    Standing on One Leg Tries to lift leg/unable to hold 3 seconds but remains standing independently    Total Score 40    Berg comment: 40/56      Timed Up and Go Test   Normal TUG (seconds) 10.32                      Objective measurements completed on examination: See above findings.               PT Education - 10/31/20 1523    Education Details Exam findings and POC    Person(s) Educated Patient    Methods Explanation    Comprehension Verbalized understanding            PT Short Term Goals - 10/31/20 1524      PT SHORT TERM GOAL #1   Title Pt will be independent with initial HEP    Time 3    Period Weeks    Status New    Target Date 11/21/20      PT SHORT TERM GOAL #2   Title Pt will demo improved 5x STS to </=20 sec for improved  functional strength    Baseline 29.1 sec (10/31/20)    Time 3    Period Weeks    Status New    Target Date 11/21/20      PT SHORT TERM GOAL #3   Title Pt will demo improved gait speed to at least 3.26 ft/sec for minimal clinical difference    Baseline Decreased heel strike 3.1 ft/sec    Time 3    Period Weeks    Status New    Target Date 11/21/20             PT Long Term Goals - 10/31/20 1529      PT LONG TERM GOAL #1   Title Pt will be independent with advanced HEP    Time 6    Period Weeks    Status New    Target Date 12/12/20      PT LONG TERM GOAL #2   Title Pt will have 5xSTS </=15 sec to demo decreased fall risk    Time 6    Period Weeks  Status New    Target Date 12/12/20      PT LONG TERM GOAL #3   Title Pt will have improved Berg Balance score to at least 45 or higher for decreased fall risk    Baseline 40/56 (10/31/20)    Time 6    Period Weeks    Status New    Target Date 12/12/20      PT LONG TERM GOAL #4   Title --                  Plan - 10/31/20 1513    Clinical Impression Statement Joshua [name] is a 69 y/o M presenting to OPPT due to complaints of increasing imbalance and falls. Pt with history of left VIM DBS (07/07/2019), acute R posterior limb internal capsule infarct (08/2019), subclavian thrombus (12/21/19), type 2 DM, BPPV, and arthritis. On assessment pt demos decreased ankle strength and ROM, L>R LE weakness, decreased balance, impaired bilat LE sensation, and impaired gait. Pt's Berg Balance score of 40/56 and 5xSTS of 29.16 sec places him at high risk of falls. Pt would benefit from PT to optimize his level of function for safer home and community mobility.    Personal Factors and Comorbidities Age;Comorbidity 1;Comorbidity 2;Comorbidity 3+;Time since onset of injury/illness/exacerbation    Comorbidities atus post left VIM DBS on July 07, 2019 with [facility] device; R posterior limb internal capsule infarct Oct 2020; subclavian  thrombus 12/21/19 neck CTA    Examination-Activity Limitations Bend;Caring for Others;Locomotion Level;Stand;Stairs    Examination-Participation Restrictions Cleaning;Community Activity    Stability/Clinical Decision Making Evolving/Moderate complexity    Clinical Decision Making Moderate    Rehab Potential Good    PT Frequency 2x / week    PT Duration 6 weeks    PT Treatment/Interventions ADLs/Self Care Home Management;Aquatic Therapy;Canalith Repostioning;Moist Heat;Ultrasound;DME Instruction;Gait training;Stair training;Functional mobility training;Therapeutic activities;Therapeutic exercise;Balance training;Neuromuscular re-education;Patient/family education;Manual techniques;Passive range of motion;Dry needling;Taping;Vasopneumatic Device;Vestibular;Orthotic Fit/Training;Electrical Stimulation;Cryotherapy    PT Next Visit Plan Initiate HEP to improve ankle strength/ROM. Work on balance with narrow BOS. Gait training to improve step clearance    Consulted and Agree with Plan of Care Patient           Patient will benefit from skilled therapeutic intervention in order to improve the following deficits and impairments:  Abnormal gait, Decreased balance, Decreased mobility, Difficulty walking, Impaired sensation, Decreased range of motion, Decreased activity tolerance, Decreased coordination, Decreased strength  Visit Diagnosis: Unsteadiness on feet  Muscle weakness (generalized)  Difficulty in walking, not elsewhere classified  Other abnormalities of gait and mobility     Problem List Patient Active Problem List   Diagnosis Date Noted  . Bilateral hearing loss 05/15/2020  . Bilateral impacted cerumen 05/15/2020  . Fungal otitis externa 05/15/2020  . Cerebral embolism with cerebral infarction 08/29/2019  . 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 Hyperlipidemia 08/28/2019  . Chronic kidney disease 08/28/2019  . Obesity (BMI 30-39.9) 08/28/2019  . Tremor 07/07/2019  .  Essential tremor 06/22/2017  . OSA on CPAP 04/10/2017  . Essential hypertension 04/10/2017  . Pulmonary thromboembolism ([place]) 04/10/2017  . PE (pulmonary thromboembolism) ([place]) 04/09/2017  . S/P left TKA 03/30/2017  . S/P total knee replacement 03/30/2017  . Cellulitis of left foot 03/07/2016  . Pre-ulcerative calluses 03/07/2016  . Type 2 diabetes mellitus with left diabetic foot ulcer ([place]) 01/14/2016  . Diabetes mellitus type 2 in obese ([place]) 08/07/2008  . GERD 08/07/2008    Joshua [name] Joshua [name] PT,  DPT 10/31/2020, 3:33 PM  [facility] [address] [place], [place], [address] Phone: [phone]   Fax:  [phone]  Name: KOHLE WINNER MRN: [id number] Date of Birth: Aug 15, 1951

## 2020-10-31 NOTE — Telephone Encounter (Signed)
Spoke to patient, aware of recommendations and verbalized understanding.   Rx updated, aware he is due for repeat BMET in 1 week,  will continue to monitor BP and report readings in 2 weeks.

## 2020-10-31 NOTE — Addendum Note (Signed)
Addended by: Patria Mane A on: 10/31/2020 11:28 AM   Modules accepted: Orders

## 2020-11-05 ENCOUNTER — Ambulatory Visit: Payer: Medicare PPO | Admitting: Physical Therapy

## 2020-11-06 ENCOUNTER — Other Ambulatory Visit: Payer: Self-pay

## 2020-11-06 ENCOUNTER — Encounter: Payer: Medicare PPO | Admitting: Orthotics

## 2020-11-06 DIAGNOSIS — E1140 Type 2 diabetes mellitus with diabetic neuropathy, unspecified: Secondary | ICD-10-CM | POA: Diagnosis not present

## 2020-11-06 DIAGNOSIS — E1129 Type 2 diabetes mellitus with other diabetic kidney complication: Secondary | ICD-10-CM | POA: Diagnosis not present

## 2020-11-06 DIAGNOSIS — Z7901 Long term (current) use of anticoagulants: Secondary | ICD-10-CM | POA: Diagnosis not present

## 2020-11-06 DIAGNOSIS — N1831 Chronic kidney disease, stage 3a: Secondary | ICD-10-CM | POA: Diagnosis not present

## 2020-11-06 DIAGNOSIS — E1121 Type 2 diabetes mellitus with diabetic nephropathy: Secondary | ICD-10-CM | POA: Diagnosis not present

## 2020-11-06 DIAGNOSIS — I129 Hypertensive chronic kidney disease with stage 1 through stage 4 chronic kidney disease, or unspecified chronic kidney disease: Secondary | ICD-10-CM | POA: Diagnosis not present

## 2020-11-06 DIAGNOSIS — Z794 Long term (current) use of insulin: Secondary | ICD-10-CM | POA: Diagnosis not present

## 2020-11-06 DIAGNOSIS — Z79899 Other long term (current) drug therapy: Secondary | ICD-10-CM

## 2020-11-06 DIAGNOSIS — Z86711 Personal history of pulmonary embolism: Secondary | ICD-10-CM | POA: Diagnosis not present

## 2020-11-06 DIAGNOSIS — R251 Tremor, unspecified: Secondary | ICD-10-CM | POA: Diagnosis not present

## 2020-11-08 ENCOUNTER — Encounter: Payer: Medicare PPO | Admitting: Orthotics

## 2020-11-08 DIAGNOSIS — Z79899 Other long term (current) drug therapy: Secondary | ICD-10-CM | POA: Diagnosis not present

## 2020-11-09 ENCOUNTER — Telehealth: Payer: Self-pay | Admitting: Podiatry

## 2020-11-09 ENCOUNTER — Ambulatory Visit: Payer: Medicare PPO | Admitting: Physical Therapy

## 2020-11-09 LAB — BASIC METABOLIC PANEL
BUN/Creatinine Ratio: 18 (ref 10–24)
BUN: 25 mg/dL (ref 8–27)
CO2: 23 mmol/L (ref 20–29)
Calcium: 9.4 mg/dL (ref 8.6–10.2)
Chloride: 105 mmol/L (ref 96–106)
Creatinine, Ser: 1.38 mg/dL — ABNORMAL HIGH (ref 0.76–1.27)
GFR calc Af Amer: 59 mL/min/{1.73_m2} — ABNORMAL LOW (ref >59)
GFR calc non Af Amer: 51 mL/min/{1.73_m2} — ABNORMAL LOW (ref >59)
Glucose: 127 mg/dL — ABNORMAL HIGH (ref 65–99)
Potassium: 4.6 mmol/L (ref 3.5–5.2)
Sodium: 144 mmol/L (ref 134–144)

## 2020-11-09 NOTE — Telephone Encounter (Signed)
Pt left message yesterday @ 158pm returning my call about his shoes not being in. He said he is available any time next week and after the new year.  I returned call and left message for pt that I can possibly get him in on 12.28  if the shoes are in but they are currently not in..please call to discuss

## 2020-11-12 ENCOUNTER — Other Ambulatory Visit: Payer: Self-pay

## 2020-11-12 ENCOUNTER — Ambulatory Visit: Payer: Medicare PPO | Admitting: Physical Therapy

## 2020-11-12 DIAGNOSIS — M6281 Muscle weakness (generalized): Secondary | ICD-10-CM | POA: Diagnosis not present

## 2020-11-12 DIAGNOSIS — R262 Difficulty in walking, not elsewhere classified: Secondary | ICD-10-CM

## 2020-11-12 DIAGNOSIS — R2681 Unsteadiness on feet: Secondary | ICD-10-CM

## 2020-11-12 DIAGNOSIS — R2689 Other abnormalities of gait and mobility: Secondary | ICD-10-CM | POA: Diagnosis not present

## 2020-11-12 NOTE — Therapy (Signed)
[facility] [address] [place], [place], [address] Phone: [phone]   Fax:  [phone]  Physical Therapy Treatment  Patient Details  Name: Joshua [name] MRN: [id number] Date of Birth: [birth year] Referring Provider (PT): Tat, Eustace Quail, DO   Encounter Date: 11/12/2020   PT End of Session - 11/12/20 1717    Visit Number 2    Number of Visits 13    Date for PT Re-Evaluation 12/12/20    Authorization Type [employer] -- auth requested 10/31/20    Progress Note Due on Visit 10    PT Start Time 1615    PT Stop Time 1700    PT Time Calculation (min) 45 min    Equipment Utilized During Treatment Gait belt    Activity Tolerance Patient tolerated treatment well    Behavior During Therapy WFL for tasks assessed/performed           Past Medical History:  Diagnosis Date   Benign essential tremor    Benign positional vertigo    CVA (cerebral vascular accident) ([place])    x2 - L retina, 1 right parietal   Degenerative arthritis    Depression    Diabetes mellitus    Dyslipidemia    GERD (gastroesophageal reflux disease)    hiatal hernia   Gout    H/O: vasectomy    Hearing aid worn    b/l   Hx of appendectomy    Hx of tonsillectomy    Hypertension    Ischemic optic neuropathy    on the left   Melanoma ([place])    Obesity    OSA on CPAP    setting = 5   Tremor, essential 06/22/2017   Wears glasses     Past Surgical History:  Procedure Laterality Date   APPENDECTOMY     arthroscopic knee surgery Bilateral    CATARACT EXTRACTION Bilateral    COLONOSCOPY     MINOR PLACEMENT OF FIDUCIAL N/A 06/30/2019   Procedure: Fiducial placement;  Surgeon: Erline Levine, MD;  Location: [facility];  Service: Neurosurgery;  Laterality: N/A;  Fiducial placement   NASAL SEPTUM SURGERY     PULSE GENERATOR IMPLANT N/A 07/14/2019   Procedure: Left cranial Implanted Pulse Generator and lead extension  placement to right chest ;  Surgeon: Erline Levine, MD;  Location: [facility];  Service: Neurosurgery;  Laterality: N/A;   SUBTHALAMIC STIMULATOR INSERTION Left 07/07/2019   Procedure: LEFT DEEP BRAIN STIMULATOR PLACEMENT;  Surgeon: Erline Levine, MD;  Location: [facility];  Service: Neurosurgery;  Laterality: Left;   TONSILLECTOMY     TOTAL KNEE ARTHROPLASTY Left 03/30/2017   Procedure: LEFT TOTAL KNEE ARTHROPLASTY;  Surgeon: Paralee Cancel, MD;  Location: [facility];  Service: Orthopedics;  Laterality: Left;   WISDOM TOOTH EXTRACTION      There were no vitals filed for this visit.   Subjective Assessment - 11/12/20 1621    Subjective Pt reports no falls or any issues.    Limitations Standing;Walking;House hold activities    How long can you sit comfortably? n/a    How long can you stand comfortably? n/a    How long can you walk comfortably? n/a    Patient Stated Goals Decrease falls and improve steadiness                             [facility] Adult PT Treatment/Exercise - 11/12/20 0001      Ambulation/Gait  Ambulation/Gait Yes    Ambulation/Gait Assistance 5: Supervision    Ambulation/Gait Assistance Details Cues for increased heel strike and step length    Ambulation Distance (Feet) 115 Feet    Assistive device None    Gait Pattern Step-through pattern;Right foot flat;Left foot flat;Poor foot clearance - left;Poor foot clearance - right;Ataxic    Ambulation Surface Level;Indoor      Knee/Hip Exercises: Standing   Hip Abduction Stengthening;2 sets;10 reps    Hip Extension Stengthening;Both;2 sets;10 reps    Other Standing Knee Exercises Carioca 2x20'    Other Standing Knee Exercises backwards walking x30'      Ankle Exercises: Standing   Heel Raises 20 reps;Both      Ankle Exercises: Seated   Other Seated Ankle Exercises ankle eversion 2x10 with green tband    Other Seated Ankle Exercises ankle inversion with green tband 2x10               Balance Exercises -  11/12/20 0001      Balance Exercises: Standing   Standing Eyes Opened Narrow base of support (BOS);Solid surface;30 secs   turning to look over shoulder.   Standing Eyes Closed Narrow base of support (BOS);Foam/compliant surface;Head turns;30 secs    Standing, One Foot on a Step Eyes open;4 inch;30 secs   x2 reps            PT Education - 11/12/20 1715    Education Details Discussed HEP.    Person(s) Educated Patient    Methods Explanation;Verbal cues;Handout    Comprehension Verbal cues required;Verbalized understanding            PT Short Term Goals - 10/31/20 1524      PT SHORT TERM GOAL #1   Title Pt will be independent with initial HEP    Time 3    Period Weeks    Status New    Target Date 11/21/20      PT SHORT TERM GOAL #2   Title Pt will demo improved 5x STS to </=20 sec for improved functional strength    Baseline 29.1 sec (10/31/20)    Time 3    Period Weeks    Status New    Target Date 11/21/20      PT SHORT TERM GOAL #3   Title Pt will demo improved gait speed to at least 3.26 ft/sec for minimal clinical difference    Baseline Decreased heel strike 3.1 ft/sec    Time 3    Period Weeks    Status New    Target Date 11/21/20             PT Long Term Goals - 10/31/20 1529      PT LONG TERM GOAL #1   Title Pt will be independent with advanced HEP    Time 6    Period Weeks    Status New    Target Date 12/12/20      PT LONG TERM GOAL #2   Title Pt will have 5xSTS </=15 sec to demo decreased fall risk    Time 6    Period Weeks    Status New    Target Date 12/12/20      PT LONG TERM GOAL #3   Title Pt will have improved Berg Balance score to at least 45 or higher for decreased fall risk    Baseline 40/56 (10/31/20)    Time 6    Period Weeks    Status New  Target Date 12/12/20      PT LONG TERM GOAL #4   Title --                 Plan - 11/12/20 1715    Clinical Impression Statement Treatment focused on establishing an ankle  and hip strengthening HEP. PT worked on dynamic gait and balance with weight shifting. Pt with increased LOB while performing head nods.    Personal Factors and Comorbidities Age;Comorbidity 1;Comorbidity 2;Comorbidity 3+;Time since onset of injury/illness/exacerbation    Comorbidities atus post left VIM DBS on July 07, 2019 with [facility] device; R posterior limb internal capsule infarct Oct 2020; subclavian thrombus 12/21/19 neck CTA    Examination-Activity Limitations Bend;Caring for Others;Locomotion Level;Stand;Stairs    Examination-Participation Restrictions Cleaning;Community Activity    Stability/Clinical Decision Making Evolving/Moderate complexity    Rehab Potential Good    PT Frequency 2x / week    PT Duration 6 weeks    PT Treatment/Interventions ADLs/Self Care Home Management;Aquatic Therapy;Canalith Repostioning;Moist Heat;Ultrasound;DME Instruction;Gait training;Stair training;Functional mobility training;Therapeutic activities;Therapeutic exercise;Balance training;Neuromuscular re-education;Patient/family education;Manual techniques;Passive range of motion;Dry needling;Taping;Vasopneumatic Device;Vestibular;Orthotic Fit/Training;Electrical Stimulation;Cryotherapy    PT Next Visit Plan Initiate HEP to improve ankle strength/ROM. Work on balance with narrow BOS. Gait training to improve step clearance    Consulted and Agree with Plan of Care Patient           Patient will benefit from skilled therapeutic intervention in order to improve the following deficits and impairments:  Abnormal gait,Decreased balance,Decreased mobility,Difficulty walking,Impaired sensation,Decreased range of motion,Decreased activity tolerance,Decreased coordination,Decreased strength  Visit Diagnosis: Unsteadiness on feet  Muscle weakness (generalized)  Difficulty in walking, not elsewhere classified  Other abnormalities of gait and mobility     Problem List Patient Active Problem List    Diagnosis Date Noted   Bilateral hearing loss 05/15/2020   Bilateral impacted cerumen 05/15/2020   Fungal otitis externa 05/15/2020   Cerebral embolism with cerebral infarction 08/29/2019   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[place]) 08/28/2019   Hyperlipidemia 08/28/2019   Chronic kidney disease 08/28/2019   Obesity (BMI 30-39.9) 08/28/2019   Tremor 07/07/2019   Essential tremor 06/22/2017   OSA on CPAP 04/10/2017   Essential hypertension 04/10/2017   Pulmonary thromboembolism ([facility]) 04/10/2017   PE (pulmonary thromboembolism) ([facility]) 04/09/2017   S/P left TKA 03/30/2017   S/P total knee replacement 03/30/2017   Cellulitis of left foot 03/07/2016   Pre-ulcerative calluses 03/07/2016   Type 2 diabetes mellitus with left diabetic foot ulcer ([place]) 01/14/2016   Diabetes mellitus type 2 in obese [facility]) 08/07/2008   GERD 08/07/2008    Raelea Gosse April Ma L Adessa Primiano PT, DPT 11/12/2020, 5:20 PM  [facility] [address] [place], [place], [address] Phone: [phone]   Fax:  [phone]  Name: Joshua [name] MRN: [id number] Date of Birth: April 28, 1951

## 2020-11-14 ENCOUNTER — Other Ambulatory Visit: Payer: Self-pay

## 2020-11-14 ENCOUNTER — Ambulatory Visit: Payer: Medicare PPO | Admitting: Physical Therapy

## 2020-11-14 DIAGNOSIS — R262 Difficulty in walking, not elsewhere classified: Secondary | ICD-10-CM | POA: Diagnosis not present

## 2020-11-14 DIAGNOSIS — M6281 Muscle weakness (generalized): Secondary | ICD-10-CM

## 2020-11-14 DIAGNOSIS — R2681 Unsteadiness on feet: Secondary | ICD-10-CM | POA: Diagnosis not present

## 2020-11-14 DIAGNOSIS — R2689 Other abnormalities of gait and mobility: Secondary | ICD-10-CM | POA: Diagnosis not present

## 2020-11-14 NOTE — Patient Instructions (Signed)
Access Code: [email or web address] URL: [email or web address] Date: 11/14/2020 Prepared by: Estill Bamberg April Thurnell Garbe  Exercises Seated Ankle Inversion with Resistance and Legs Crossed - 1 x daily - 7 x weekly - 2 sets - 10 reps Seated Ankle Eversion with Resistance - 1 x daily - 7 x weekly - 2 sets - 10 reps Standing Heel Raise with Support - 1 x daily - 7 x weekly - 2 sets - 10 reps Hip Abduction with Resistance Loop - 1 x daily - 7 x weekly - 3 sets - 10 reps Hip Extension with Resistance Loop - 1 x daily - 7 x weekly - 3 sets - 10 reps Backward Walking with Counter Support - 1 x daily - 7 x weekly - 3 sets - 10 reps Carioca with Counter Support - 1 x daily - 7 x weekly - 3 sets - 10 reps Gastroc Stretch on Wall - 1 x daily - 7 x weekly - 20-30 sec hold Soleus Stretch on Wall - 1 x daily - 7 x weekly - 20-30 sec hold Standing Romberg to 1/2 Tandem Stance - 1 x daily - 7 x weekly - 20-30 sec hold  Pt to use one day for his ankle exercises and a different day for his hip strengthening.  Work on balance and weightshifting daily. Stretches PRN.

## 2020-11-14 NOTE — Therapy (Signed)
[facility] [address] [place], [place], [address] Phone: [phone]   Fax:  [phone]  Physical Therapy Treatment  Patient Details  Name: Joshua [name] MRN: [id number] Date of Birth: 01/05/1951 Referring Provider (PT): Tat, Eustace Quail, DO   Encounter Date: 11/14/2020   PT End of Session - 11/14/20 1234    Visit Number 3    Number of Visits 13    Date for PT Re-Evaluation 12/12/20    Authorization Type [employer] -- auth requested 10/31/20    Progress Note Due on Visit 10    PT Start Time 9381    PT Stop Time 1315    PT Time Calculation (min) 40 min    Equipment Utilized During Treatment Gait belt    Activity Tolerance Patient tolerated treatment well    Behavior During Therapy WFL for tasks assessed/performed           Past Medical History:  Diagnosis Date   Benign essential tremor    Benign positional vertigo    CVA (cerebral vascular accident) ([facility])    x2 - L retina, 1 right parietal   Degenerative arthritis    Depression    Diabetes mellitus    Dyslipidemia    GERD (gastroesophageal reflux disease)    hiatal hernia   Gout    H/O: vasectomy    Hearing aid worn    b/l   Hx of appendectomy    Hx of tonsillectomy    Hypertension    Ischemic optic neuropathy    on the left   Melanoma ([facility])    Obesity    OSA on CPAP    setting = 5   Tremor, essential 06/22/2017   Wears glasses     Past Surgical History:  Procedure Laterality Date   APPENDECTOMY     arthroscopic knee surgery Bilateral    CATARACT EXTRACTION Bilateral    COLONOSCOPY     MINOR PLACEMENT OF FIDUCIAL N/A 06/30/2019   Procedure: Fiducial placement;  Surgeon: Erline Levine, MD;  Location: [facility];  Service: Neurosurgery;  Laterality: N/A;  Fiducial placement   NASAL SEPTUM SURGERY     PULSE GENERATOR IMPLANT N/A 07/14/2019   Procedure: Left cranial Implanted Pulse Generator and lead extension  placement to right chest ;  Surgeon: Erline Levine, MD;  Location: [facility];  Service: Neurosurgery;  Laterality: N/A;   SUBTHALAMIC STIMULATOR INSERTION Left 07/07/2019   Procedure: LEFT DEEP BRAIN STIMULATOR PLACEMENT;  Surgeon: Erline Levine, MD;  Location: [facility];  Service: Neurosurgery;  Laterality: Left;   TONSILLECTOMY     TOTAL KNEE ARTHROPLASTY Left 03/30/2017   Procedure: LEFT TOTAL KNEE ARTHROPLASTY;  Surgeon: Paralee Cancel, MD;  Location[facility];  Service: Orthopedics;  Laterality: Left;   WISDOM TOOTH EXTRACTION      There were no vitals filed for this visit.   Subjective Assessment - 11/14/20 1237    Subjective Pt states that he has felt a difference with performing heel strike. Pt reports getting a little bit of hip soreness.    Limitations Standing;Walking;House hold activities    How long can you sit comfortably? n/a    How long can you stand comfortably? n/a    How long can you walk comfortably? n/a    Patient Stated Goals Decrease falls and improve steadiness    Currently in Pain? No/denies  [facility] Adult PT Treatment/Exercise - 11/14/20 0001      Exercises   Exercises Knee/Hip      Knee/Hip Exercises: Stretches   Piriformis Stretch Both;30 seconds      Knee/Hip Exercises: Standing   Hip Abduction Stengthening;10 reps    Other Standing Knee Exercises Carioca 2x20', backwards walking 2x20'    Other Standing Knee Exercises lateral band walks green tband 2x10; monster walk forward/backward 2x10      Ankle Exercises: Stretches   Soleus Stretch 30 seconds    Gastroc Stretch 30 seconds               Balance Exercises - 11/14/20 0001      Balance Exercises: Standing   Tandem Stance Eyes open;2 reps;30 secs    SLS with Vectors Solid surface   x10 reps bilat practicing gentle heel tap forward, x10 reps bilat practicing gentle foot tap forward + side              PT Short Term Goals - 10/31/20 1524      PT  SHORT TERM GOAL #1   Title Pt will be independent with initial HEP    Time 3    Period Weeks    Status New    Target Date 11/21/20      PT SHORT TERM GOAL #2   Title Pt will demo improved 5x STS to </=20 sec for improved functional strength    Baseline 29.1 sec (10/31/20)    Time 3    Period Weeks    Status New    Target Date 11/21/20      PT SHORT TERM GOAL #3   Title Pt will demo improved gait speed to at least 3.26 ft/sec for minimal clinical difference    Baseline Decreased heel strike 3.1 ft/sec    Time 3    Period Weeks    Status New    Target Date 11/21/20             PT Long Term Goals - 10/31/20 1529      PT LONG TERM GOAL #1   Title Pt will be independent with advanced HEP    Time 6    Period Weeks    Status New    Target Date 12/12/20      PT LONG TERM GOAL #2   Title Pt will have 5xSTS </=15 sec to demo decreased fall risk    Time 6    Period Weeks    Status New    Target Date 12/12/20      PT LONG TERM GOAL #3   Title Pt will have improved Berg Balance score to at least 45 or higher for decreased fall risk    Baseline 40/56 (10/31/20)    Time 6    Period Weeks    Status New    Target Date 12/12/20      PT LONG TERM GOAL #4   Title --                 Plan - 11/14/20 1310    Clinical Impression Statement Treatment focused on progressing ankle and hip strengthening, dynamic gait, balance, and weight shifting. Pt continues to progress towards his goals.    Personal Factors and Comorbidities Age;Comorbidity 1;Comorbidity 2;Comorbidity 3+;Time since onset of injury/illness/exacerbation    Comorbidities atus post left VIM DBS on July 07, 2019 with [facility] device; R posterior limb internal capsule infarct Oct 2020; subclavian thrombus 12/21/19 neck CTA  Examination-Activity Limitations Bend;Caring for Others;Locomotion Level;Stand;Stairs    Examination-Participation Restrictions Cleaning;Community Activity    Stability/Clinical Decision  Making Evolving/Moderate complexity    Rehab Potential Good    PT Frequency 2x / week    PT Duration 6 weeks    PT Treatment/Interventions ADLs/Self Care Home Management;Aquatic Therapy;Canalith Repostioning;Moist Heat;Ultrasound;DME Instruction;Gait training;Stair training;Functional mobility training;Therapeutic activities;Therapeutic exercise;Balance training;Neuromuscular re-education;Patient/family education;Manual techniques;Passive range of motion;Dry needling;Taping;Vasopneumatic Device;Vestibular;Orthotic Fit/Training;Electrical Stimulation;Cryotherapy    PT Next Visit Plan How were the exercises? Progress hip/ankle exercises as able. Work on balance with narrow BOS/SLS and improving step clearance. Keep on improving balance with weightshifting/dynamic gait    PT Home Exercise Plan Access Code [email or web address]    Consulted and Agree with Plan of Care Patient           Patient will benefit from skilled therapeutic intervention in order to improve the following deficits and impairments:  Abnormal gait,Decreased balance,Decreased mobility,Difficulty walking,Impaired sensation,Decreased range of motion,Decreased activity tolerance,Decreased coordination,Decreased strength  Visit Diagnosis: Unsteadiness on feet  Muscle weakness (generalized)  Difficulty in walking, not elsewhere classified  Other abnormalities of gait and mobility     Problem List Patient Active Problem List   Diagnosis Date Noted   Bilateral hearing loss 05/15/2020   Bilateral impacted cerumen 05/15/2020   Fungal otitis externa 05/15/2020   Cerebral embolism with cerebral infarction 08/29/2019   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[place]) 08/28/2019   Hyperlipidemia 08/28/2019   Chronic kidney disease 08/28/2019   Obesity (BMI 30-39.9) 08/28/2019   Tremor 07/07/2019   Essential tremor 06/22/2017   OSA on CPAP 04/10/2017   Essential hypertension 04/10/2017   Pulmonary thromboembolism ([place]) 04/10/2017   PE  (pulmonary thromboembolism) ([place]) 04/09/2017   S/P left TKA 03/30/2017   S/P total knee replacement 03/30/2017   Cellulitis of left foot 03/07/2016   Pre-ulcerative calluses 03/07/2016   Type 2 diabetes mellitus with left diabetic foot ulcer ([place]) 01/14/2016   Diabetes mellitus type 2 in obese [facility]) 08/07/2008   GERD 08/07/2008    Karrin Eisenmenger April Beatrix Shipper Cullan Launer PT, DPT 11/14/2020, 1:19 PM  [facility] [address] [place], [place], [address] Phone: [phone]   Fax:  [phone]  Name: Joshua [name] MRN: [id number] Date of Birth: May 25, 1951

## 2020-11-20 ENCOUNTER — Ambulatory Visit: Payer: Medicare PPO | Admitting: Physical Therapy

## 2020-11-20 ENCOUNTER — Ambulatory Visit: Payer: Medicare PPO | Admitting: Orthotics

## 2020-11-22 ENCOUNTER — Ambulatory Visit: Payer: Medicare PPO | Admitting: Physical Therapy

## 2020-12-05 ENCOUNTER — Other Ambulatory Visit: Payer: Self-pay | Admitting: Cardiology

## 2020-12-05 DIAGNOSIS — G4733 Obstructive sleep apnea (adult) (pediatric): Secondary | ICD-10-CM | POA: Diagnosis not present

## 2020-12-06 ENCOUNTER — Ambulatory Visit: Payer: Medicare PPO | Admitting: Physical Therapy

## 2020-12-07 DIAGNOSIS — H16223 Keratoconjunctivitis sicca, not specified as Sjogren's, bilateral: Secondary | ICD-10-CM | POA: Diagnosis not present

## 2020-12-07 DIAGNOSIS — H04123 Dry eye syndrome of bilateral lacrimal glands: Secondary | ICD-10-CM | POA: Diagnosis not present

## 2020-12-07 DIAGNOSIS — H524 Presbyopia: Secondary | ICD-10-CM | POA: Diagnosis not present

## 2020-12-11 ENCOUNTER — Ambulatory Visit: Payer: Medicare PPO | Admitting: Physical Therapy

## 2020-12-11 ENCOUNTER — Ambulatory Visit: Payer: Medicare PPO | Admitting: Orthotics

## 2020-12-13 ENCOUNTER — Ambulatory Visit: Payer: Medicare PPO | Attending: Neurology | Admitting: Physical Therapy

## 2020-12-17 DIAGNOSIS — R600 Localized edema: Secondary | ICD-10-CM | POA: Diagnosis not present

## 2020-12-17 DIAGNOSIS — N1831 Chronic kidney disease, stage 3a: Secondary | ICD-10-CM | POA: Diagnosis not present

## 2020-12-17 DIAGNOSIS — M1990 Unspecified osteoarthritis, unspecified site: Secondary | ICD-10-CM | POA: Diagnosis not present

## 2020-12-17 DIAGNOSIS — E1129 Type 2 diabetes mellitus with other diabetic kidney complication: Secondary | ICD-10-CM | POA: Diagnosis not present

## 2020-12-17 DIAGNOSIS — I422 Other hypertrophic cardiomyopathy: Secondary | ICD-10-CM | POA: Diagnosis not present

## 2020-12-17 DIAGNOSIS — I129 Hypertensive chronic kidney disease with stage 1 through stage 4 chronic kidney disease, or unspecified chronic kidney disease: Secondary | ICD-10-CM | POA: Diagnosis not present

## 2020-12-17 DIAGNOSIS — G4733 Obstructive sleep apnea (adult) (pediatric): Secondary | ICD-10-CM | POA: Diagnosis not present

## 2020-12-17 DIAGNOSIS — E1121 Type 2 diabetes mellitus with diabetic nephropathy: Secondary | ICD-10-CM | POA: Diagnosis not present

## 2020-12-18 ENCOUNTER — Ambulatory Visit: Payer: Medicare PPO

## 2020-12-18 ENCOUNTER — Ambulatory Visit (INDEPENDENT_AMBULATORY_CARE_PROVIDER_SITE_OTHER): Payer: Medicare PPO | Admitting: Orthotics

## 2020-12-18 ENCOUNTER — Telehealth: Payer: Self-pay | Admitting: Podiatry

## 2020-12-18 ENCOUNTER — Other Ambulatory Visit: Payer: Self-pay

## 2020-12-18 DIAGNOSIS — E11621 Type 2 diabetes mellitus with foot ulcer: Secondary | ICD-10-CM | POA: Diagnosis not present

## 2020-12-18 DIAGNOSIS — L97529 Non-pressure chronic ulcer of other part of left foot with unspecified severity: Secondary | ICD-10-CM

## 2020-12-18 DIAGNOSIS — M216X9 Other acquired deformities of unspecified foot: Secondary | ICD-10-CM | POA: Diagnosis not present

## 2020-12-18 DIAGNOSIS — E119 Type 2 diabetes mellitus without complications: Secondary | ICD-10-CM

## 2020-12-18 DIAGNOSIS — L84 Corns and callosities: Secondary | ICD-10-CM

## 2020-12-18 NOTE — Progress Notes (Signed)

## 2020-12-18 NOTE — Telephone Encounter (Signed)
Pt left 2 messages stating he was checking on his diabetic shoes that have been delayed.  Upon checking the shoes are in and pt had an appt on 1.18 to pick up but it was cxled because of weather.  I returned call and left message for pt to call to r/s that appt that the shoes are in.

## 2020-12-20 ENCOUNTER — Ambulatory Visit: Payer: Medicare PPO

## 2020-12-25 ENCOUNTER — Ambulatory Visit: Payer: Medicare PPO | Attending: Neurology

## 2020-12-31 NOTE — Therapy (Signed)
[facility] [address] [place], [place], [address] Phone: [phone]   Fax:  [phone]  Patient Details  Name: Joshua [name] MRN: [id number] Date of Birth: 1951/11/23 Referring Provider:  No ref. provider found  Encounter Date: 12/31/2020  PHYSICAL THERAPY NON VISIT DISCHARGE SUMMARY  Visits from Start of Care: 3  Current functional level related to goals / functional outcomes: Patient is being discharged from PT services due to not returning since last visit. Unable to check short or long term goals due to decreased attendance.    Remaining deficits: Abnormal Gait, Decreased Balance, Decreased Strength   Education / Equipment: HEP Provided  Plan:                                                    Patient goals were not met. Patient is being discharged due to not returning since the last visit.  ?????                     PT Short Term Goals - 10/31/20 1524      PT SHORT TERM GOAL #1   Title Pt will be independent with initial HEP    Time 3    Period Weeks    Status New    Target Date 11/21/20      PT SHORT TERM GOAL #2   Title Pt will demo improved 5x STS to </=20 sec for improved functional strength    Baseline 29.1 sec (10/31/20)    Time 3    Period Weeks    Status New    Target Date 11/21/20      PT SHORT TERM GOAL #3   Title Pt will demo improved gait speed to at least 3.26 ft/sec for minimal clinical difference    Baseline Decreased heel strike 3.1 ft/sec    Time 3    Period Weeks    Status New    Target Date 11/21/20           PT Long Term Goals - 10/31/20 1529      PT LONG TERM GOAL #1   Title Pt will be independent with advanced HEP    Time 6    Period Weeks    Status New    Target Date 12/12/20      PT LONG TERM GOAL #2   Title Pt will have 5xSTS </=15 sec to demo decreased fall risk    Time 6    Period Weeks    Status New    Target Date 12/12/20      PT  LONG TERM GOAL #3   Title Pt will have improved Berg Balance score to at least 45 or higher for decreased fall risk    Baseline 40/56 (10/31/20)    Time 6    Period Weeks    Status New    Target Date 12/12/20      PT LONG TERM GOAL #4   Title --            Jones Bales, PT, DPT 12/31/2020, 12:20 PM  [facility] [address] [place], [place], [address] Phone: [phone]   Fax:  [phone]

## 2021-01-14 ENCOUNTER — Telehealth: Payer: Self-pay | Admitting: Neurology

## 2021-01-14 NOTE — Telephone Encounter (Signed)
We have given him some ability to adjust it on his own.  Has he tried that?

## 2021-01-14 NOTE — Telephone Encounter (Signed)
Patient states his right side is shaking really bad and would like to see her before his May appt he states that the shaking is worst than before his surgery  He would like to speak to someone about getting information for the [employer], he needs a telephone number please call patient   Patient is a DBS patient that she would need 60 mins with him

## 2021-01-14 NOTE — Telephone Encounter (Signed)
Spoke with patient who states he needs to have his DBS adjusted. He states he has shaking in his right hand and wants a sooner appointment.   Advised him that Dr Tat is out of the office and I will be in touch with him with in the next few days. He voiced understanding.

## 2021-01-15 NOTE — Telephone Encounter (Signed)
He states he has adjusted the DBS twice. He states there was a slight adjustment but last week he went back to one then this week he is back on two. He states he has no relief bad tremor.   Wants to be seen sooner than appointment that is scheduled.

## 2021-01-15 NOTE — Telephone Encounter (Signed)
Spoke with patient and gave him Dr Doristine Devoid recommendations. He voiced understanding.   He states he will call back tomorrow if it does not work.

## 2021-01-15 NOTE — Telephone Encounter (Signed)
I got that but within program 2, he is currently on 3.6 (or he was) and he can adjust that UP a little to 3.7 or 3.8.

## 2021-01-22 ENCOUNTER — Ambulatory Visit: Payer: Medicare PPO | Admitting: Podiatry

## 2021-01-23 ENCOUNTER — Encounter: Payer: Medicare PPO | Admitting: Neurology

## 2021-01-24 ENCOUNTER — Ambulatory Visit: Payer: Medicare PPO | Admitting: Cardiology

## 2021-01-24 ENCOUNTER — Other Ambulatory Visit: Payer: Self-pay

## 2021-01-24 ENCOUNTER — Encounter: Payer: Self-pay | Admitting: Cardiology

## 2021-01-24 VITALS — BP 128/8 | HR 96 | Ht 76.0 in | Wt 257.8 lb

## 2021-01-24 DIAGNOSIS — I10 Essential (primary) hypertension: Secondary | ICD-10-CM | POA: Diagnosis not present

## 2021-01-24 DIAGNOSIS — R42 Dizziness and giddiness: Secondary | ICD-10-CM

## 2021-01-24 DIAGNOSIS — E785 Hyperlipidemia, unspecified: Secondary | ICD-10-CM | POA: Diagnosis not present

## 2021-01-24 DIAGNOSIS — R0609 Other forms of dyspnea: Secondary | ICD-10-CM

## 2021-01-24 DIAGNOSIS — I422 Other hypertrophic cardiomyopathy: Secondary | ICD-10-CM

## 2021-01-24 DIAGNOSIS — R0600 Dyspnea, unspecified: Secondary | ICD-10-CM | POA: Diagnosis not present

## 2021-01-24 NOTE — Patient Instructions (Addendum)
Medication Instructions:  Your physician recommends that you continue on your current medications as directed. Please refer to the Current Medication list given to you today.  *If you need a refill on your cardiac medications before your next appointment, please call your pharmacy*  Testing/Procedures: Your physician has requested that you have a [place] (2 day) at [facility]. For further information please visit [email or web address]. Please follow instruction sheet, as given.   How to prepare for your Myocardial Perfusion Test:  Do not eat or drink 3 hours prior to your test, except you may have water.  Do not consume products containing caffeine (regular or decaffeinated) 12 hours prior to your test. (ex: coffee, chocolate, sodas, tea).  Do bring a list of your current medications with you.  If not listed below, you may take your medications as normal.  Do wear comfortable clothes (no dresses or overalls) and walking shoes, tennis shoes preferred (No heels or open toe shoes are allowed).  Do NOT wear cologne, perfume, aftershave, or lotions (deodorant is allowed).  The test will take approximately 3 to 4 hours to complete  If these instructions are not followed, your test will have to be rescheduled.    CT coronary calcium score. This test is done at [address] [facility] [address]. This is $99 out of pocket.   Coronary CalciumScan A coronary calcium scan is an imaging test used to look for deposits of calcium and other fatty materials (plaques) in the inner lining of the blood vessels of the heart (coronary arteries). These deposits of calcium and plaques can partly clog and narrow the coronary arteries without producing any symptoms or warning signs. This puts a person at risk for a heart attack. This test can detect these deposits before symptoms develop. Tell a health care provider about:  Any allergies you have.  All medicines you are taking, including vitamins,  herbs, eye drops, creams, and over-the-counter medicines.  Any problems you or family members have had with anesthetic medicines.  Any blood disorders you have.  Any surgeries you have had.  Any medical conditions you have.  Whether you are pregnant or may be pregnant. What are the risks? Generally, this is a safe procedure. However, problems may occur, including:  Harm to a pregnant woman and her unborn baby. This test involves the use of radiation. Radiation exposure can be dangerous to a pregnant woman and her unborn baby. If you are pregnant, you generally should not have this procedure done.  Slight increase in the risk of cancer. This is because of the radiation involved in the test. What happens before the procedure? No preparation is needed for this procedure. What happens during the procedure?  You will undress and remove any jewelry around your neck or chest.  You will put on a hospital gown.  Sticky electrodes will be placed on your chest. The electrodes will be connected to an electrocardiogram (ECG) machine to record a tracing of the electrical activity of your heart.  A CT scanner will take pictures of your heart. During this time, you will be asked to lie still and hold your breath for 2-3 seconds while a picture of your heart is being taken. The procedure may vary among health care providers and hospitals. What happens after the procedure?  You can get dressed.  You can return to your normal activities.  It is up to you to get the results of your test. Ask your health care provider, or the department  that is doing the test, when your results will be ready. Summary  A coronary calcium scan is an imaging test used to look for deposits of calcium and other fatty materials (plaques) in the inner lining of the blood vessels of the heart (coronary arteries).  Generally, this is a safe procedure. Tell your health care provider if you are pregnant or may be  pregnant.  No preparation is needed for this procedure.  A CT scanner will take pictures of your heart.  You can return to your normal activities after the scan is done. This information is not intended to replace advice given to you by your health care provider. Make sure you discuss any questions you have with your health care provider. Document Released: 05/08/2008 Document Revised: 09/29/2016 Document Reviewed: 09/29/2016 Elsevier Interactive Patient Education  2017 [place]: At [facility], you and your health needs are our priority.  As part of our continuing mission to provide you with exceptional heart care, we have created designated Provider Care Teams.  These Care Teams include your primary Cardiologist (physician) and Advanced Practice Providers (APPs -  Physician Assistants and Nurse Practitioners) who all work together to provide you with the care you need, when you need it.  We recommend signing up for the patient portal called "MyChart".  Sign up information is provided on this After Visit Summary.  MyChart is used to connect with patients for Virtual Visits (Telemedicine).  Patients are able to view lab/test results, encounter notes, upcoming appointments, etc.  Non-urgent messages can be sent to your provider as well.   To learn more about what you can do with MyChart, go to [email or web address].    Your next appointment:   3 month(s)  The format for your next appointment:   In Person  Provider:   Oswaldo Milian, MD

## 2021-01-24 NOTE — Progress Notes (Signed)
Cardiology Office Note:    Date:  01/24/2021   ID:  Joshua [name], DOB 05/30/1951, MRN [id number]  PCP:  [name], MD  Cardiologist:  No primary care provider on file.  Electrophysiologist:  None   Referring MD: [name], MD   Chief Complaint  Patient presents with  . Follow-up    History of Present Illness:    Joshua [name] is a 70 y.o. male with a hx of HCM, hypertension, diabetes, OSA, PE after knee surgery, essential tremor status post DBS June 2020, CVA who presents for follow-up.  Admitted to [facility] from 08/28/19 through 08/31/19 with an acute CVA.  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  MRI brain showed right internal capsule infarct.  Echocardiogram showed no cardiogenic source of embolism.  He was started on heparin given subclavian artery thrombosis, and this was transitioned to Eliquis.  It was unclear if the thrombosis was due to a cardiac source or developed in situ.  Stroke team recommended Eliquis for 2 months and repeating CTA neck; if CTA neck negative and no atrial fibrillation noted on 30-day monitor, stroke team recommended stopping Eliquis and resuming antiplatelet agent.  TTE was notable for an incidental finding of asymmetric basal septal hypertrophy.  He was referred to cardiology and seen on 09/19/19.  Cardiac MRI was ordered, which showed Basal septal hypertrophy measuring up to 109mm (lateral wall 8mm), consistent with hypertrophic cardiomyopathy.  PYP scan showed no evidence of amyloid.  Cardiac monitor showed no VT, occasional PVCs (1.3% of beats).   Repeat monitor on 08/22/2020 showed 1 4 beat run of NSVT, 6 runs of SVT longest lasting 16 beats, occasional PVCs (4%).  He denies any family history of HCM.  Does have vertigo.  No syncope except with CVA years ago.   Since last clinic visit, he reports that he has been doing well.  States that he has been having dyspnea with exertion but denies any chest pain.   Reports dyspnea is new, he is now getting short of breath with minimal exertion.  Reports his lightheadedness has improved significantly since stopping hydrochlorothiazide.  He did develop worsening lower extremity edema on amlodipine 10 mg daily and his dose has been reduced to 5 mg daily.  He denies any palpitations or syncope.   Past Medical History:  Diagnosis Date  . Benign essential tremor   . Benign positional vertigo   . CVA (cerebral vascular accident) ([place])    x2 - L retina, 1 right parietal  . Degenerative arthritis   . Depression   . Diabetes mellitus   . Dyslipidemia   . GERD (gastroesophageal reflux disease)    hiatal hernia  . Gout   . H/O: vasectomy   . Hearing aid worn    b/l  . Hx of appendectomy   . Hx of tonsillectomy   . Hypertension   . 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 Melanoma ([place])   . Obesity   . OSA on CPAP    setting = 5  . Tremor, essential 06/22/2017  . Wears glasses     Past Surgical History:  Procedure Laterality Date  . APPENDECTOMY    . arthroscopic knee surgery Bilateral   . CATARACT EXTRACTION Bilateral   . COLONOSCOPY    . MINOR PLACEMENT OF FIDUCIAL N/A 06/30/2019   Procedure: Fiducial placement;  Surgeon: Erline Levine, MD;  Location: [place];  Service: Neurosurgery;  Laterality: N/A;  Fiducial placement  . NASAL SEPTUM SURGERY    . PULSE GENERATOR IMPLANT N/A 07/14/2019   Procedure: Left cranial Implanted Pulse Generator and lead extension placement to right chest ;  Surgeon: Erline Levine, MD;  Location: [place];  Service: Neurosurgery;  Laterality: N/A;  . SUBTHALAMIC STIMULATOR INSERTION Left 07/07/2019   Procedure: LEFT DEEP BRAIN STIMULATOR PLACEMENT;  Surgeon: Erline Levine, MD;  Location: [place];  Service: Neurosurgery;  Laterality: Left;  . TONSILLECTOMY    . TOTAL KNEE ARTHROPLASTY Left 03/30/2017   Procedure: LEFT TOTAL KNEE ARTHROPLASTY;  Surgeon: Paralee Cancel, MD;  Location: [facility];  Service: Orthopedics;  Laterality:  Left;  . WISDOM TOOTH EXTRACTION      Current Medications: Current Meds  Medication Sig  . acetic acid-hydrocortisone (VOSOL-HC) OTIC solution hydrocortisone-acetic acid 1 %-2 % ear drops  . amLODipine (NORVASC) 10 MG tablet Take 1 tablet (10 mg total) by mouth at bedtime.  Marland Kitchen atorvastatin (LIPITOR) 80 MG tablet Take 1 tablet (80 mg total) by mouth daily at 6 PM.  . DULoxetine (CYMBALTA) 60 MG capsule Take 60 mg by mouth daily.  Marland Kitchen ELIQUIS 5 MG TABS tablet TAKE 1 TABLET BY MOUTH TWICE DAILY.  . fenofibrate 54 MG tablet Take 54 mg by mouth daily.  . fluorometholone (FML) 0.1 % ophthalmic suspension   . Insulin Glargine, 1 Unit Dial, (TOUJEO SOLOSTAR) 300 UNIT/ML SOPN Inject 85 Units into the skin 2 (two) times daily.  . insulin lispro (HUMALOG) 100 UNIT/ML KwikPen Inject 15-25 Units into the skin See admin instructions. Inject 15 units subcutaneously after breakfast and 25 units after lunch and supper  . losartan (COZAAR) 100 MG tablet Take 100 mg by mouth daily.  . Lutein-Zeaxanthin 25-5 MG CAPS Take 1 tablet by mouth daily.  . metFORMIN (GLUCOPHAGE) 1000 MG tablet Take 1,000 mg by mouth 2 (two) times daily with a meal.  . mirtazapine (REMERON) 7.5 MG tablet mirtazapine 7.5 mg tablet  . Multiple Vitamin (MULTIVITAMIN WITH MINERALS) TABS tablet Take 1 tablet by mouth daily.  . mupirocin ointment (BACTROBAN) 2 % Apply 1 application topically 2 (two) times daily.  . Omega-3 Fatty Acids (FISH OIL) 1000 MG CAPS Take 2,000 mg by mouth daily. Reported on 02/13/2016  . ONETOUCH VERIO test strip   . Polyethyl Glycol-Propyl Glycol (SYSTANE OP) Place 1-2 drops into both eyes 4 (four) times daily as needed (dry eyes).   Marland Kitchen PRESCRIPTION MEDICATION Inhale into the lungs at bedtime. CPAP  . RESTASIS 0.05 % ophthalmic emulsion   . SURE COMFORT PEN NEEDLES 31G X 5 MM MISC      Allergies:   Melatonin   Social History   Socioeconomic History  . Marital status: Married    Spouse name: [name]  . Number of  children: 2  . Years of education: Bachelors   . Highest education level: Bachelor's degree (e.g., BA, AB, BS)  Occupational History  . Occupation: retired    Fish farm manager: [employer]    Comment: teaching/coaching  Tobacco Use  . Smoking status: Former Smoker    Packs/day: 1.00    Years: 10.00    Pack years: 10.00    Types: Cigarettes    Quit date: 11/25/1983    Years since quitting: 37.1  . Smokeless tobacco: Never Used  Vaping Use  . Vaping Use: Never used  Substance and Sexual Activity  . Alcohol use: Yes    Alcohol/week: 2.0 standard drinks    Types: 2 Standard drinks or  equivalent per week    Comment: occassionally  . Drug use: No  . Sexual activity: Not on file  Other Topics Concern  . Not on file  Social History Narrative   Patient lives at home with wife. [name](   Patient has 2 children that are in good health.    Patient works for [employer]. Retired .   Patient has a Bachelors degree in History.    Social Determinants of Health   Financial Resource Strain: Not on file  Food Insecurity: Not on file  Transportation Needs: Not on file  Physical Activity: Not on file  Stress: Not on file  Social Connections: Not on file     Family History: The patient's family history includes Alzheimer's disease in his father; Cerebral aneurysm in his mother; Diabetes in his maternal grandmother; Healthy in his son; Tremor in his brother, maternal uncle, and mother. There is no history of Colon cancer.  ROS:   Please see the history of present illness.     All other systems reviewed and are negative.  EKGs/Labs/Other Studies Reviewed:    The following studies were reviewed today:   EKG:  EKG is ordered today.  EKG ordered at shows sinus rhythm, first-degree AV block, rate 96, left axis deviation, Q waves V1-3 and II, III, aVF  Cardiac MRI 10/07/19: 1. Limited study, as only a few sequences were able to be completed due to limitations from presence  of deep brain stimulator 2. Basal septal hypertrophy measuring up to 75mm (lateral wall 12 mm), consistent with hypertrophic cardiomyopathy 3. Patchy late gadolinium enhancement in basal septum, consistent with HCM 4. Basal inferolateral midwall LGE, which would not be a typical pattern for HCM, as more commonly seen in setting of prior myocarditis or sarcoidosis. 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  Normal LV size with hyperdynamic systolic function (EF 86%) 6.  Normal RV size and systolic function (EF 76%)  TTE 08/29/19: 1. Technically difficult study. Left ventricular ejection fraction appears grossly normal, approximately 55-60%, though difficult visualization even with contrast 2. There is asymmetric basal septal hypertrophy measuring 18 mm in basal septum (12 mm posterior wall). Consider cardiac MRI to assess for hypertrophic cardiomyopathy if clinically indicated 3. Definity contrast agent was given IV to delineate the left ventricular endocardial borders. 4. Global right ventricle has normal systolic function.The right ventricular size is normal. No increase in right ventricular wall thickness. 5. There is mild dilatation of the aortic root measuring 39 mm. 6. The inferior vena cava is dilated in size with <50% respiratory variability, suggesting right atrial pressure of 15 mmHg.  PYP scan 10/25/19:  The study is normal.  No evidence of TTR amyloidosis.   Cardiac monitor 11/11/19:  No significant abnormalities  No atrial fibrillation. No VT  Occasional PVCs (1.3% of beats).  1 patient triggered event, which appears to correspond to short pause (1.1 seconds) from a blocked PA   Predominant rhythm is sinus rhythm. Range is 57 to 137 bpm with average of 89 bpm. No atrial fibrillation, sustained ventricular tachycardia, significant pause, or high degree AV block. Occasional PVCs (1.3% of beats). 1 patient triggered  events. Triggered event appears to correspond to short pause (1.1 seconds) from a blocked PAC.  No significant abnormalities.   Recent Labs: 10/24/2020: ALT 17; Hemoglobin 13.8; Magnesium 1.8; Platelets 237; TSH 2.050 11/08/2020: BUN 25; Creatinine, Ser 1.38; Potassium 4.6; Sodium 144  Recent Lipid Panel    Component  Value Date/Time   CHOL 161 10/24/2020 0943   TRIG 180 (H) 10/24/2020 0943   HDL 30 (L) 10/24/2020 0943   CHOLHDL 5.4 (H) 10/24/2020 0943   CHOLHDL 7.0 08/29/2019 0409   VLDL 50 (H) 08/29/2019 0409   LDLCALC 99 10/24/2020 0943    Physical Exam:    VS:  BP (!) 128/8   Pulse 96   Ht 6\' 4"  (1.93 m)   Wt 257 lb 12.8 oz (116.9 kg)   BMI 31.38 kg/m     Wt Readings from Last 3 Encounters:  01/24/21 257 lb 12.8 oz (116.9 kg)  10/25/20 258 lb (117 kg)  10/24/20 255 lb 12.8 oz (116 kg)     GEN: Well nourished, well developed in no acute distress HEENT: Normal NECK: No JVD LYMPHATICS: No lymphadenopathy CARDIAC: RRR, no murmurs, rubs, gallops RESPIRATORY:  Clear to auscultation without rales, wheezing or rhonchi  ABDOMEN: Soft, non-tender, non-distended MUSCULOSKELETAL:  No edema; No deformity  SKIN: Warm and dry NEUROLOGIC:  Alert and oriented x 3 PSYCHIATRIC:  Normal affect   ASSESSMENT:    1. Chest pain of uncertain etiology   2. Hyperlipidemia, unspecified hyperlipidemia type    PLAN:    Hypertrophic cardiomyopathy: 8mm (lateral wall 12 mm) on CMR.  In addition, had unusual scar pattern for HCM, with basal inferolateral scar on MRI.  Fabry's disease can be associated with this scar pattern and asymmetric hypertrophy, so alpha galactosidase was checked and was normal. Scar pattern not c/w amyloid on CMR, but unfortunately unable to do T1 mapping to r/o amyloid due to DBS.  No evidence of amyloid on PYP scan.  Scar pattern may represent prior episode of myocarditis.  Suspect HCM.  No obstruction.  Recommended screening of first-degree relatives with TTE, he  reports that he has discussed with his sons.  Cardiac monitor on 08/22/2020 showed one 4 beat run of NSVT, 6 runs of SVT longest lasting 16 beats, occasional PVCs (4%).   Dyspnea: Reports dyspnea with minimal exertion.  Could represent anginal equivalent.  Will check Lexiscan Myoview to evaluate for ischemia.  Lightheadedness: Suspect due to diuretic use in setting of HCM.  Has improved significantly since stopping hydrochlorothiazide.  CVA: Subclavian artery thrombosis diagnosed, unclear if cardioembolic source or developed in situ.  Currently on Eliquis.  No AF on cardiac monitor.  Hypertension: On amlodipine 5 mg daily,  losartan 100 mg daily.  Appears controlled  Hyperlipidemia: On atorvastatin 80 mg daily.  LDL 99 on 10/24/2020.  Will check calcium score to guide how aggressive to be in lowering cholesterol.  Type 2 diabetes: A1c 7.9%.  On insulin  PVCs: occasional (1.3%) on monitor.  He is asymptomatic and with normal LV systolic function, no treatment indicated   RTC in 3 months  Shared Decision Making/Informed Consent The risks [chest pain, shortness of breath, cardiac arrhythmias, dizziness, blood pressure fluctuations, myocardial infarction, stroke/transient ischemic attack, nausea, vomiting, allergic reaction, radiation exposure, metallic taste sensation and life-threatening complications (estimated to be 1 in 10,000)], benefits (risk stratification, diagnosing coronary artery disease, treatment guidance) and alternatives of a nuclear stress test were discussed in detail with Mr. Miyoshi and he agrees to proceed.     Medication Adjustments/Labs and Tests Ordered: Current medicines are reviewed at length with the patient today.  Concerns regarding medicines are outlined above.  Orders Placed This Encounter  Procedures  . CT CARDIAC SCORING (SELF PAY ONLY)  . MYOCARDIAL PERFUSION IMAGING  . EKG 12-Lead   No orders of  the defined types were placed in this  encounter.   Patient Instructions   Medication Instructions:  Your physician recommends that you continue on your current medications as directed. Please refer to the Current Medication list given to you today.  *If you need a refill on your cardiac medications before your next appointment, please call your pharmacy*  Testing/Procedures: Your physician has requested that you have a [place] (2 day) at [facility]. For further information please visit [email or web address]. Please follow instruction sheet, as given.   How to prepare for your Myocardial Perfusion Test:  Do not eat or drink 3 hours prior to your test, except you may have water.  Do not consume products containing caffeine (regular or decaffeinated) 12 hours prior to your test. (ex: coffee, chocolate, sodas, tea).  Do bring a list of your current medications with you.  If not listed below, you may take your medications as normal.  Do wear comfortable clothes (no dresses or overalls) and walking shoes, tennis shoes preferred (No heels or open toe shoes are allowed).  Do NOT wear cologne, perfume, aftershave, or lotions (deodorant is allowed).  The test will take approximately 3 to 4 hours to complete  If these instructions are not followed, your test will have to be rescheduled.    CT coronary calcium score. This test is done at [address] [facility] [address]. This is $99 out of pocket.   Coronary CalciumScan A coronary calcium scan is an imaging test used to look for deposits of calcium and other fatty materials (plaques) in the inner lining of the blood vessels of the heart (coronary arteries). These deposits of calcium and plaques can partly clog and narrow the coronary arteries without producing any symptoms or warning signs. This puts a person at risk for a heart attack. This test can detect these deposits before symptoms develop. Tell a health care provider about:  Any allergies you have.  All  medicines you are taking, including vitamins, herbs, eye drops, creams, and over-the-counter medicines.  Any problems you or family members have had with anesthetic medicines.  Any blood disorders you have.  Any surgeries you have had.  Any medical conditions you have.  Whether you are pregnant or may be pregnant. What are the risks? Generally, this is a safe procedure. However, problems may occur, including:  Harm to a pregnant woman and her unborn baby. This test involves the use of radiation. Radiation exposure can be dangerous to a pregnant woman and her unborn baby. If you are pregnant, you generally should not have this procedure done.  Slight increase in the risk of cancer. This is because of the radiation involved in the test. What happens before the procedure? No preparation is needed for this procedure. What happens during the procedure?  You will undress and remove any jewelry around your neck or chest.  You will put on a hospital gown.  Sticky electrodes will be placed on your chest. The electrodes will be connected to an electrocardiogram (ECG) machine to record a tracing of the electrical activity of your heart.  A CT scanner will take pictures of your heart. During this time, you will be asked to lie still and hold your breath for 2-3 seconds while a picture of your heart is being taken. The procedure may vary among health care providers and hospitals. What happens after the procedure?  You can get dressed.  You can return to your normal activities.  It is up to you  to get the results of your test. Ask your health care provider, or the department that is doing the test, when your results will be ready. Summary  A coronary calcium scan is an imaging test used to look for deposits of calcium and other fatty materials (plaques) in the inner lining of the blood vessels of the heart (coronary arteries).  Generally, this is a safe procedure. Tell your health care  provider if you are pregnant or may be pregnant.  No preparation is needed for this procedure.  A CT scanner will take pictures of your heart.  You can return to your normal activities after the scan is done. This information is not intended to replace advice given to you by your health care provider. Make sure you discuss any questions you have with your health care provider. Document Released: 05/08/2008 Document Revised: 09/29/2016 Document Reviewed: 09/29/2016 Elsevier Interactive Patient Education  2017 [place]: At [facility], you and your health needs are our priority.  As part of our continuing mission to provide you with exceptional heart care, we have created designated Provider Care Teams.  These Care Teams include your primary Cardiologist (physician) and Advanced Practice Providers (APPs -  Physician Assistants and Nurse Practitioners) who all work together to provide you with the care you need, when you need it.  We recommend signing up for the patient portal called "MyChart".  Sign up information is provided on this After Visit Summary.  MyChart is used to connect with patients for Virtual Visits (Telemedicine).  Patients are able to view lab/test results, encounter notes, upcoming appointments, etc.  Non-urgent messages can be sent to your provider as well.   To learn more about what you can do with MyChart, go to [email or web address].    Your next appointment:   3 month(s)  The format for your next appointment:   In Person  Provider:   Oswaldo Milian, MD        Signed, Donato Heinz, MD  01/24/2021 2:07 PM    [place]

## 2021-01-27 ENCOUNTER — Emergency Department (HOSPITAL_BASED_OUTPATIENT_CLINIC_OR_DEPARTMENT_OTHER)
Admission: EM | Admit: 2021-01-27 | Discharge: 2021-01-27 | Disposition: A | Discharge status: Home / Self Care | Payer: Medicare PPO | Attending: Emergency Medicine | Admitting: Emergency Medicine

## 2021-01-27 ENCOUNTER — Emergency Department (HOSPITAL_BASED_OUTPATIENT_CLINIC_OR_DEPARTMENT_OTHER): Payer: Medicare PPO

## 2021-01-27 ENCOUNTER — Other Ambulatory Visit: Payer: Self-pay

## 2021-01-27 ENCOUNTER — Encounter (HOSPITAL_BASED_OUTPATIENT_CLINIC_OR_DEPARTMENT_OTHER): Payer: Self-pay | Admitting: Emergency Medicine

## 2021-01-27 DIAGNOSIS — Z87891 Personal history of nicotine dependence: Secondary | ICD-10-CM | POA: Diagnosis not present

## 2021-01-27 DIAGNOSIS — N189 Chronic kidney disease, unspecified: Secondary | ICD-10-CM | POA: Diagnosis not present

## 2021-01-27 DIAGNOSIS — R410 Disorientation, unspecified: Secondary | ICD-10-CM | POA: Insufficient documentation

## 2021-01-27 DIAGNOSIS — Z20822 Contact with and (suspected) exposure to covid-19: Secondary | ICD-10-CM | POA: Diagnosis not present

## 2021-01-27 DIAGNOSIS — R350 Frequency of micturition: Secondary | ICD-10-CM | POA: Diagnosis not present

## 2021-01-27 DIAGNOSIS — Z794 Long term (current) use of insulin: Secondary | ICD-10-CM | POA: Insufficient documentation

## 2021-01-27 DIAGNOSIS — R262 Difficulty in walking, not elsewhere classified: Secondary | ICD-10-CM | POA: Diagnosis not present

## 2021-01-27 DIAGNOSIS — Z7901 Long term (current) use of anticoagulants: Secondary | ICD-10-CM | POA: Insufficient documentation

## 2021-01-27 DIAGNOSIS — R0602 Shortness of breath: Secondary | ICD-10-CM | POA: Diagnosis not present

## 2021-01-27 DIAGNOSIS — I129 Hypertensive chronic kidney disease with stage 1 through stage 4 chronic kidney disease, or unspecified chronic kidney disease: Secondary | ICD-10-CM | POA: Insufficient documentation

## 2021-01-27 DIAGNOSIS — I10 Essential (primary) hypertension: Secondary | ICD-10-CM

## 2021-01-27 DIAGNOSIS — R531 Weakness: Secondary | ICD-10-CM | POA: Diagnosis not present

## 2021-01-27 DIAGNOSIS — Z96652 Presence of left artificial knee joint: Secondary | ICD-10-CM | POA: Diagnosis not present

## 2021-01-27 DIAGNOSIS — E1122 Type 2 diabetes mellitus with diabetic chronic kidney disease: Secondary | ICD-10-CM | POA: Insufficient documentation

## 2021-01-27 DIAGNOSIS — R4182 Altered mental status, unspecified: Secondary | ICD-10-CM | POA: Diagnosis not present

## 2021-01-27 DIAGNOSIS — Z79899 Other long term (current) drug therapy: Secondary | ICD-10-CM | POA: Diagnosis not present

## 2021-01-27 DIAGNOSIS — Z7984 Long term (current) use of oral hypoglycemic drugs: Secondary | ICD-10-CM | POA: Insufficient documentation

## 2021-01-27 DIAGNOSIS — R0600 Dyspnea, unspecified: Secondary | ICD-10-CM

## 2021-01-27 DIAGNOSIS — R42 Dizziness and giddiness: Secondary | ICD-10-CM | POA: Diagnosis not present

## 2021-01-27 LAB — DIFFERENTIAL
Abs Immature Granulocytes: 0.03 10*3/uL (ref 0.00–0.07)
Basophils Absolute: 0.1 10*3/uL (ref 0.0–0.1)
Basophils Relative: 1 %
Eosinophils Absolute: 0.2 10*3/uL (ref 0.0–0.5)
Eosinophils Relative: 2 %
Immature Granulocytes: 0 %
Lymphocytes Relative: 35 %
Lymphs Abs: 2.9 10*3/uL (ref 0.7–4.0)
Monocytes Absolute: 0.4 10*3/uL (ref 0.1–1.0)
Monocytes Relative: 5 %
Neutro Abs: 4.7 10*3/uL (ref 1.7–7.7)
Neutrophils Relative %: 57 %

## 2021-01-27 LAB — PROTIME-INR
INR: 1 (ref 0.8–1.2)
Prothrombin Time: 13.1 seconds (ref 11.4–15.2)

## 2021-01-27 LAB — CBG MONITORING, ED
Glucose-Capillary: 372 mg/dL — ABNORMAL HIGH (ref 70–99)
Glucose-Capillary: 314 mg/dL — ABNORMAL HIGH (ref 70–99)

## 2021-01-27 LAB — URINALYSIS, MICROSCOPIC (REFLEX)

## 2021-01-27 LAB — COMPREHENSIVE METABOLIC PANEL
ALT: 21 U/L (ref 0–44)
AST: 18 U/L (ref 15–41)
Albumin: 4.3 g/dL (ref 3.5–5.0)
Alkaline Phosphatase: 91 U/L (ref 38–126)
Anion gap: 12 (ref 5–15)
BUN: 27 mg/dL — ABNORMAL HIGH (ref 8–23)
CO2: 25 mmol/L (ref 22–32)
Calcium: 10.1 mg/dL (ref 8.9–10.3)
Chloride: 102 mmol/L (ref 98–111)
Creatinine, Ser: 1.54 mg/dL — ABNORMAL HIGH (ref 0.61–1.24)
GFR, Estimated: 48 mL/min — ABNORMAL LOW (ref >60)
Glucose, Bld: 382 mg/dL — ABNORMAL HIGH (ref 70–99)
Potassium: 4.1 mmol/L (ref 3.5–5.1)
Sodium: 139 mmol/L (ref 135–145)
Total Bilirubin: 0.5 mg/dL (ref 0.3–1.2)
Total Protein: 7.8 g/dL (ref 6.5–8.1)

## 2021-01-27 LAB — CBC
HCT: 45.8 % (ref 39.0–52.0)
Hemoglobin: 15.3 g/dL (ref 13.0–17.0)
MCH: 29.9 pg (ref 26.0–34.0)
MCHC: 33.4 g/dL (ref 30.0–36.0)
MCV: 89.6 fL (ref 80.0–100.0)
Platelets: 269 10*3/uL (ref 150–400)
RBC: 5.11 MIL/uL (ref 4.22–5.81)
RDW: 12.8 % (ref 11.5–15.5)
WBC: 8.3 10*3/uL (ref 4.0–10.5)
nRBC: 0 % (ref 0.0–0.2)

## 2021-01-27 LAB — SARS CORONAVIRUS 2 BY RT PCR (HOSPITAL ORDER, PERFORMED IN ~~LOC~~ HOSPITAL LAB): SARS Coronavirus 2: NEGATIVE

## 2021-01-27 LAB — URINALYSIS, ROUTINE W REFLEX MICROSCOPIC
Bilirubin Urine: NEGATIVE
Glucose, UA: >=500 mg/dL — AB
Hgb urine dipstick: NEGATIVE
Ketones, ur: NEGATIVE mg/dL
Leukocytes,Ua: NEGATIVE
Nitrite: NEGATIVE
Protein, ur: >300 mg/dL — AB
Specific Gravity, Urine: 1.015 (ref 1.005–1.030)
pH: 6 (ref 5.0–8.0)

## 2021-01-27 LAB — BRAIN NATRIURETIC PEPTIDE: B Natriuretic Peptide: 16.4 pg/mL (ref 0.0–100.0)

## 2021-01-27 LAB — APTT: aPTT: 38 seconds — ABNORMAL HIGH (ref 24–36)

## 2021-01-27 LAB — TROPONIN I (HIGH SENSITIVITY)
Troponin I (High Sensitivity): 12 ng/L (ref <18)
Troponin I (High Sensitivity): 12 ng/L (ref <18)

## 2021-01-27 MED ORDER — SODIUM CHLORIDE 0.9% FLUSH
3.0000 mL | Freq: Once | INTRAVENOUS | Status: DC
  Filled 2021-01-27: qty 3

## 2021-01-27 MED ORDER — INSULIN ASPART 100 UNIT/ML ~~LOC~~ SOLN
2.0000 [IU] | Freq: Once | SUBCUTANEOUS | Status: AC
  Administered 2021-01-27: 2 [IU] via SUBCUTANEOUS

## 2021-01-27 NOTE — ED Triage Notes (Addendum)
Pt c/o high blood pressure and problems processing thoughts since 0930 today. Pt has history of stroke in past, pt denies unilateral weakness. Reports increased weakness to B/L legs x 2 months. Pt and family noticed slow, slurred speech x 2 weeks

## 2021-01-27 NOTE — ED Provider Notes (Signed)
[facility] EMERGENCY DEPARTMENT Provider Note   CSN: [id number] Arrival date & time: 01/27/21  1217     History Chief Complaint  Patient presents with  . Hypertension    Joshua [name] is a 70 y.o. male.  HPI Patient presents with few different complaints.  Some confusion.  Some difficulty thinking.  For the last few weeks has been having more difficulty walking.  Has been seen by neurology and started and rehab for that.  Is at the brain stimulator due to tremor.  Has had previous stroke however.  Also more shortness of breath.  Able to do less activity.  States he recently saw his cardiologist who has planned a stress test.  No chest pain with activity but gets more short of breath and is even short of breath with talking.  No fevers.  No chills.  No coughing.  No increased swelling of the legs.  No abdominal pain.  States he has been urinating more frequently.  States the last day or 2 he has been urinating multiple times at night.  States his sugars were running good yesterday but were 300 upon arrival here today.  Is diabetic on treatment.    Past Medical History:  Diagnosis Date  . Benign essential tremor   . Benign positional vertigo   . CVA (cerebral vascular accident) ([place])    x2 - L retina, 1 right parietal  . Degenerative arthritis   . Depression   . Diabetes mellitus   . Dyslipidemia   . GERD (gastroesophageal reflux disease)    hiatal hernia  . Gout   . H/O: vasectomy   . Hearing aid worn    b/l  . Hx of appendectomy   . Hx of tonsillectomy   . Hypertension   . 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 Melanoma ([place])   . Obesity   . OSA on CPAP    setting = 5  . Tremor, essential 06/22/2017  . Wears glasses     Patient Active Problem List   Diagnosis Date Noted  . Bilateral hearing loss 05/15/2020  . Bilateral impacted cerumen 05/15/2020  . Fungal otitis externa 05/15/2020  . Cerebral embolism with cerebral infarction 08/29/2019  .  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[place]) 08/28/2019  . Hyperlipidemia 08/28/2019  . Chronic kidney disease 08/28/2019  . Obesity (BMI 30-39.9) 08/28/2019  . Tremor 07/07/2019  . Essential tremor 06/22/2017  . OSA on CPAP 04/10/2017  . Essential hypertension 04/10/2017  . Pulmonary thromboembolism ([place]) 04/10/2017  . PE (pulmonary thromboembolism) ([place]) 04/09/2017  . S/P left TKA 03/30/2017  . S/P total knee replacement 03/30/2017  . Cellulitis of left foot 03/07/2016  . Pre-ulcerative calluses 03/07/2016  . Type 2 diabetes mellitus with left diabetic foot ulcer ([place]) 01/14/2016  . Diabetes mellitus type 2 in obese ([employer]) 08/07/2008  . GERD 08/07/2008    Past Surgical History:  Procedure Laterality Date  . APPENDECTOMY    . arthroscopic knee surgery Bilateral   . CATARACT EXTRACTION Bilateral   . COLONOSCOPY    . MINOR PLACEMENT OF FIDUCIAL N/A 06/30/2019   Procedure: Fiducial placement;  Surgeon: Erline Levine, MD;  Location: [facility];  Service: Neurosurgery;  Laterality: N/A;  Fiducial placement  . NASAL SEPTUM SURGERY    . PULSE GENERATOR IMPLANT N/A 07/14/2019   Procedure: Left cranial Implanted Pulse Generator and lead extension placement to right chest ;  Surgeon: Erline Levine, MD;  Location: [facility];  Service: Neurosurgery;  Laterality: N/A;  . SUBTHALAMIC STIMULATOR INSERTION Left 07/07/2019   Procedure: LEFT DEEP BRAIN STIMULATOR PLACEMENT;  Surgeon: Erline Levine, MD;  Location: [facility];  Service: Neurosurgery;  Laterality: Left;  . TONSILLECTOMY    . TOTAL KNEE ARTHROPLASTY Left 03/30/2017   Procedure: LEFT TOTAL KNEE ARTHROPLASTY;  Surgeon: Paralee Cancel, MD;  Location: [facility];  Service: Orthopedics;  Laterality: Left;  . WISDOM TOOTH EXTRACTION         Family History  Problem Relation Age of Onset  . Cerebral aneurysm Mother   . Tremor Mother   . Alzheimer's disease Father   . Tremor Brother   . Tremor Maternal Uncle   . Diabetes Maternal Grandmother   . Healthy Son   .  Colon cancer Neg Hx     Social History   Tobacco Use  . Smoking status: Former Smoker    Packs/day: 1.00    Years: 10.00    Pack years: 10.00    Types: Cigarettes    Quit date: 11/25/1983    Years since quitting: 37.2  . Smokeless tobacco: Never Used  Vaping Use  . Vaping Use: Never used  Substance Use Topics  . Alcohol use: Yes    Alcohol/week: 2.0 standard drinks    Types: 2 Standard drinks or equivalent per week    Comment: occassionally  . Drug use: No    Home Medications Prior to Admission medications   Medication Sig Start Date End Date Taking? Authorizing Provider  acetic acid-hydrocortisone (VOSOL-HC) OTIC solution hydrocortisone-acetic acid 1 %-2 % ear drops    [provider]  amLODipine (NORVASC) 10 MG tablet Take 1 tablet (10 mg total) by mouth at bedtime. 10/31/20   Donato Heinz, MD  atorvastatin (LIPITOR) 80 MG tablet Take 1 tablet (80 mg total) by mouth daily at 6 PM. 08/31/19   Danford, Suann Larry, MD  DULoxetine (CYMBALTA) 60 MG capsule Take 60 mg by mouth daily.    [provider]  ELIQUIS 5 MG TABS tablet TAKE 1 TABLET BY MOUTH TWICE DAILY. 12/05/20   Donato Heinz, MD  fenofibrate 54 MG tablet Take 54 mg by mouth daily.    [provider]  fluorometholone (FML) 0.1 % ophthalmic suspension  10/05/20   [provider]  Insulin Glargine, 1 Unit Dial, (TOUJEO SOLOSTAR) 300 UNIT/ML SOPN Inject 85 Units into the skin 2 (two) times daily.    [provider]  insulin lispro (HUMALOG) 100 UNIT/ML KwikPen Inject 15-25 Units into the skin See admin instructions. Inject 15 units subcutaneously after breakfast and 25 units after lunch and supper    [provider]  losartan (COZAAR) 100 MG tablet Take 100 mg by mouth daily.    [provider]  Lutein-Zeaxanthin 25-5 MG CAPS Take 1 tablet by mouth daily.    [provider]  metFORMIN (GLUCOPHAGE) 1000 MG tablet Take 1,000 mg by mouth  2 (two) times daily with a meal.    [provider]  mirtazapine (REMERON) 7.5 MG tablet mirtazapine 7.5 mg tablet    [provider]  Multiple Vitamin (MULTIVITAMIN WITH MINERALS) TABS tablet Take 1 tablet by mouth daily.    [provider]  mupirocin ointment (BACTROBAN) 2 % Apply 1 application topically 2 (two) times daily. 07/24/20   Trula Slade, DPM  Omega-3 Fatty Acids (FISH OIL) 1000 MG CAPS Take 2,000 mg by mouth daily. Reported on 02/13/2016    [provider]  [facility] VERIO test strip  09/13/18   [provider]  Polyethyl Glycol-Propyl Glycol (SYSTANE OP) Place 1-2 drops into both eyes 4 (four) times daily as needed (dry eyes).     [provider]  PRESCRIPTION MEDICATION Inhale into the lungs at bedtime. CPAP    [provider]  RESTASIS 0.05 % ophthalmic emulsion  10/05/20   [provider]  [place] X 5 MM [place]  09/12/19   [provider]    Allergies    Melatonin  Review of Systems   Review of Systems  Constitutional: Positive for fatigue. Negative for appetite change and fever.  HENT: Negative for congestion.   Respiratory: Positive for shortness of breath.   Cardiovascular: Negative for leg swelling.  Gastrointestinal: Negative for abdominal distention.  Genitourinary: Negative for flank pain.  Musculoskeletal: Negative for back pain.  Skin: Negative for rash.  Neurological: Positive for tremors. Negative for weakness.  Psychiatric/Behavioral: Negative for confusion.    Physical Exam Updated Vital Signs BP (!) 148/93 (BP Location: Right Arm)   Pulse 90   Temp 98.4 F (36.9 C) (Oral)   Resp 15   Ht 6\' 4"  (1.93 m)   Wt 116.9 kg   SpO2 98%   BMI 31.38 kg/m   Physical Exam Vitals and nursing note reviewed.  HENT:     Head: Normocephalic.     Mouth/Throat:     Mouth: Mucous membranes are moist.  Eyes:     Extraocular Movements: Extraocular movements  intact.  Cardiovascular:     Rate and Rhythm: Regular rhythm.  Pulmonary:     Breath sounds: No wheezing or rhonchi.     Comments: Patient with some dyspnea. Abdominal:     Tenderness: There is no abdominal tenderness.  Musculoskeletal:        General: No tenderness.     Cervical back: Neck supple.  Skin:    General: Skin is warm.  Neurological:     Mental Status: He is alert and oriented to person, place, and time.     Comments: Awake and appropriate, however some mild confusion about when his stroke was.  Moves all extremities.  Good flexion extension of bilateral ankles.  Good movement in upper extremities.     ED Results / Procedures / Treatments   Labs (all labs ordered are listed, but only abnormal results are displayed) Labs Reviewed  APTT - Abnormal; Notable for the following components:      Result Value   aPTT 38 (*)    All other components within normal limits  COMPREHENSIVE METABOLIC PANEL - Abnormal; Notable for the following components:   Glucose, Bld 382 (*)    BUN 27 (*)    Creatinine, Ser 1.54 (*)    GFR, Estimated 48 (*)    All other components within normal limits  URINALYSIS, ROUTINE W REFLEX MICROSCOPIC - Abnormal; Notable for the following components:   Glucose, UA >=500 (*)    Protein, ur >300 (*)    All other components within normal limits  URINALYSIS, MICROSCOPIC (REFLEX) - Abnormal; Notable for the following components:   Bacteria, UA RARE (*)    All other components within normal limits  CBG MONITORING, ED - Abnormal; Notable for the following components:   Glucose-Capillary 372 (*)    All other components within normal limits  RESP PANEL BY RT-PCR (FLU A&B, COVID) ARPGX2  PROTIME-INR  CBC  DIFFERENTIAL  BRAIN NATRIURETIC PEPTIDE  CBG MONITORING, ED  TROPONIN I (HIGH SENSITIVITY)  EKG EKG Interpretation  Date/Time:  Sunday January 27 2021 12:32:21 EST Ventricular Rate:  104 PR Interval:  212 QRS Duration: 118 QT  Interval:  348 QTC Calculation: 457 R Axis:   -65 Text Interpretation: Sinus tachycardia with 1st degree A-V block Left anterior fascicular block Minimal voltage criteria for LVH, may be normal variant ( Cornell product ) Anterior infarct , age undetermined Abnormal ECG Confirmed by Davonna Belling [id number]) on 01/27/2021 1:39:48 PM   Radiology CT HEAD WO CONTRAST  Result Date: 01/27/2021 CLINICAL DATA:  Altered mental status today.  Hypertension. EXAM: CT HEAD WITHOUT CONTRAST TECHNIQUE: Contiguous axial images were obtained from the base of the skull through the vertex without intravenous contrast. COMPARISON:  Head CT scan 12/15/2019. FINDINGS: Brain: No evidence of acute infarction, hemorrhage, hydrocephalus, extra-axial collection or mass lesion/mass effect. Vascular: No hyperdense vessel or unexpected calcification. Skull: Intact.  No focal lesion. Sinuses/Orbits: Status post cataract surgery. Other: Small radiopaque foreign bodies in the left orbit and lateral to the right orbit again seen. IMPRESSION: No acute abnormality.  Stable compared to prior exam. Electronically Signed   By: Inge Rise M.D.   On: 01/27/2021 13:41    Procedures Procedures   Medications Ordered in ED Medications  sodium chloride flush (NS) 0.9 % injection 3 mL (3 mLs Intravenous Not Given 01/27/21 1255)    ED Course  I have reviewed the triage vital signs and the nursing notes.  Pertinent labs & imaging results that were available during my care of the patient were reviewed by me and considered in my medical decision making (see chart for details).    MDM Rules/Calculators/A&P                         Patient presents with shortness of breath weakness some confusion more difficulty walking.  Has been going for a while.  Worse the last few days however.  Has seen cardiology for the shortness of breath and thought it potentially could be an anginal equivalent and outpatient stress test has been planned.   However feeling worse overall.  Has had urinary frequency but urine does not show infection.  Patient has known deep brain stimulator.  Pulmonary embolism felt less likely since patient is currently on anticoagulation.  Mild hyperglycemia but not DKA.  Chest x-ray and troponin pending.  However with worsening shortness of breath and thought of anginal equivalent patient may end up benefiting from admission to the hospital.  Care turned over to Dr. Darl Householder.  Final Clinical Impression(s) / ED Diagnoses Final diagnoses:  Dyspnea, unspecified type    Rx / DC Orders ED Discharge Orders    None       Davonna Belling, MD 01/27/21 1521

## 2021-01-27 NOTE — ED Provider Notes (Signed)
  Physical Exam  BP 135/87 (BP Location: Right Arm)   Pulse 88   Temp 98.4 F (36.9 C) (Oral)   Resp 20   Ht 6\' 4"  (1.93 m)   Wt 116.9 kg   SpO2 98%   BMI 31.38 kg/m   Physical Exam  ED Course/Procedures     Procedures  MDM  Patient care assumed at 3 pm. Patient here with dizziness and chest pressure and hypertension this morning. Patient's BP was 160s on arrival. Sign out pending troponins and likely admission   7:07 PM Trop neg x 2. Patient has no chest pain. BP down to 135/87. I reviewed notes from cardiology last week. His doctor has been adjusting his BP meds for hypertension. Cardiology plans to do myoview next week. Offered admission for r/o ACS vs close follow up. Patient states that he can follow up with his doctor this week and rather go home. Told him to return if he has worsening chest pain, dizziness.        Drenda Freeze, MD 01/27/21 E[facility]

## 2021-01-27 NOTE — Discharge Instructions (Signed)
You likely have symptomatic hypertension.   Your heart enzyme tests are normal right now   See your doctor for follow up this week. Please get your myoview as scheduled this week   Return to ER if you have worse weakness, dizziness, chest pain, trouble breathing, trouble speaking.

## 2021-01-27 NOTE — ED Notes (Signed)
Drawn extra lt grn and lav tubes

## 2021-01-28 ENCOUNTER — Encounter: Payer: Self-pay | Admitting: Cardiology

## 2021-01-28 ENCOUNTER — Telehealth: Payer: Self-pay | Admitting: Cardiology

## 2021-01-28 NOTE — Telephone Encounter (Signed)
Spoke with patient regarding 02/11/21 and 02/12/21 1:15pm Lexiscan myoview appointments at 1:15pm at [address], [address]---Calcium scoring scheduled 02/21/21 at the same address. Will mail information to patient and he voiced his understanding.

## 2021-01-29 ENCOUNTER — Other Ambulatory Visit: Payer: Self-pay

## 2021-01-29 ENCOUNTER — Emergency Department (HOSPITAL_COMMUNITY): Payer: Medicare PPO

## 2021-01-29 ENCOUNTER — Ambulatory Visit: Payer: Medicare PPO | Admitting: Podiatry

## 2021-01-29 ENCOUNTER — Observation Stay (HOSPITAL_COMMUNITY)
Admission: EM | Admit: 2021-01-29 | Discharge: 2021-02-02 | Disposition: A | Discharge status: Home / Self Care | Payer: Medicare PPO | Attending: Family Medicine | Admitting: Family Medicine

## 2021-01-29 DIAGNOSIS — I313 Pericardial effusion (noninflammatory): Secondary | ICD-10-CM | POA: Diagnosis not present

## 2021-01-29 DIAGNOSIS — Z79899 Other long term (current) drug therapy: Secondary | ICD-10-CM | POA: Insufficient documentation

## 2021-01-29 DIAGNOSIS — Z7901 Long term (current) use of anticoagulants: Secondary | ICD-10-CM | POA: Insufficient documentation

## 2021-01-29 DIAGNOSIS — M4602 Spinal enthesopathy, cervical region: Secondary | ICD-10-CM | POA: Diagnosis not present

## 2021-01-29 DIAGNOSIS — E1165 Type 2 diabetes mellitus with hyperglycemia: Secondary | ICD-10-CM | POA: Diagnosis not present

## 2021-01-29 DIAGNOSIS — Z794 Long term (current) use of insulin: Secondary | ICD-10-CM | POA: Insufficient documentation

## 2021-01-29 DIAGNOSIS — E119 Type 2 diabetes mellitus without complications: Secondary | ICD-10-CM | POA: Insufficient documentation

## 2021-01-29 DIAGNOSIS — G4489 Other headache syndrome: Secondary | ICD-10-CM | POA: Diagnosis not present

## 2021-01-29 DIAGNOSIS — E1129 Type 2 diabetes mellitus with other diabetic kidney complication: Secondary | ICD-10-CM | POA: Diagnosis not present

## 2021-01-29 DIAGNOSIS — I6381 Other cerebral infarction due to occlusion or stenosis of small artery: Secondary | ICD-10-CM | POA: Diagnosis not present

## 2021-01-29 DIAGNOSIS — R29810 Facial weakness: Secondary | ICD-10-CM | POA: Diagnosis not present

## 2021-01-29 DIAGNOSIS — J9601 Acute respiratory failure with hypoxia: Principal | ICD-10-CM

## 2021-01-29 DIAGNOSIS — S0990XA Unspecified injury of head, initial encounter: Secondary | ICD-10-CM | POA: Diagnosis not present

## 2021-01-29 DIAGNOSIS — R0902 Hypoxemia: Secondary | ICD-10-CM | POA: Diagnosis not present

## 2021-01-29 DIAGNOSIS — R0609 Other forms of dyspnea: Secondary | ICD-10-CM

## 2021-01-29 DIAGNOSIS — Z20822 Contact with and (suspected) exposure to covid-19: Secondary | ICD-10-CM | POA: Diagnosis not present

## 2021-01-29 DIAGNOSIS — I129 Hypertensive chronic kidney disease with stage 1 through stage 4 chronic kidney disease, or unspecified chronic kidney disease: Secondary | ICD-10-CM | POA: Diagnosis not present

## 2021-01-29 DIAGNOSIS — I10 Essential (primary) hypertension: Secondary | ICD-10-CM | POA: Diagnosis present

## 2021-01-29 DIAGNOSIS — R531 Weakness: Secondary | ICD-10-CM

## 2021-01-29 DIAGNOSIS — N189 Chronic kidney disease, unspecified: Secondary | ICD-10-CM | POA: Diagnosis present

## 2021-01-29 DIAGNOSIS — R5383 Other fatigue: Secondary | ICD-10-CM | POA: Diagnosis present

## 2021-01-29 DIAGNOSIS — Z7984 Long term (current) use of oral hypoglycemic drugs: Secondary | ICD-10-CM | POA: Insufficient documentation

## 2021-01-29 DIAGNOSIS — N1831 Chronic kidney disease, stage 3a: Secondary | ICD-10-CM | POA: Diagnosis not present

## 2021-01-29 DIAGNOSIS — R600 Localized edema: Secondary | ICD-10-CM | POA: Insufficient documentation

## 2021-01-29 DIAGNOSIS — L97529 Non-pressure chronic ulcer of other part of left foot with unspecified severity: Secondary | ICD-10-CM

## 2021-01-29 DIAGNOSIS — E11621 Type 2 diabetes mellitus with foot ulcer: Secondary | ICD-10-CM | POA: Diagnosis not present

## 2021-01-29 DIAGNOSIS — R410 Disorientation, unspecified: Secondary | ICD-10-CM | POA: Diagnosis not present

## 2021-01-29 DIAGNOSIS — I422 Other hypertrophic cardiomyopathy: Secondary | ICD-10-CM | POA: Diagnosis not present

## 2021-01-29 DIAGNOSIS — Z8673 Personal history of transient ischemic attack (TIA), and cerebral infarction without residual deficits: Secondary | ICD-10-CM | POA: Insufficient documentation

## 2021-01-29 DIAGNOSIS — L84 Corns and callosities: Secondary | ICD-10-CM

## 2021-01-29 DIAGNOSIS — Z96652 Presence of left artificial knee joint: Secondary | ICD-10-CM | POA: Insufficient documentation

## 2021-01-29 DIAGNOSIS — E1121 Type 2 diabetes mellitus with diabetic nephropathy: Secondary | ICD-10-CM | POA: Diagnosis not present

## 2021-01-29 DIAGNOSIS — S199XXA Unspecified injury of neck, initial encounter: Secondary | ICD-10-CM | POA: Diagnosis not present

## 2021-01-29 DIAGNOSIS — M79674 Pain in right toe(s): Secondary | ICD-10-CM

## 2021-01-29 DIAGNOSIS — R519 Headache, unspecified: Secondary | ICD-10-CM | POA: Insufficient documentation

## 2021-01-29 DIAGNOSIS — I447 Left bundle-branch block, unspecified: Secondary | ICD-10-CM | POA: Diagnosis not present

## 2021-01-29 DIAGNOSIS — E785 Hyperlipidemia, unspecified: Secondary | ICD-10-CM | POA: Diagnosis present

## 2021-01-29 DIAGNOSIS — Z87891 Personal history of nicotine dependence: Secondary | ICD-10-CM | POA: Insufficient documentation

## 2021-01-29 DIAGNOSIS — R0600 Dyspnea, unspecified: Secondary | ICD-10-CM | POA: Diagnosis not present

## 2021-01-29 DIAGNOSIS — B351 Tinea unguium: Secondary | ICD-10-CM

## 2021-01-29 DIAGNOSIS — R296 Repeated falls: Secondary | ICD-10-CM | POA: Diagnosis not present

## 2021-01-29 DIAGNOSIS — R0602 Shortness of breath: Secondary | ICD-10-CM | POA: Diagnosis not present

## 2021-01-29 DIAGNOSIS — J9811 Atelectasis: Secondary | ICD-10-CM | POA: Diagnosis not present

## 2021-01-29 DIAGNOSIS — G459 Transient cerebral ischemic attack, unspecified: Secondary | ICD-10-CM | POA: Diagnosis not present

## 2021-01-29 DIAGNOSIS — M79675 Pain in left toe(s): Secondary | ICD-10-CM

## 2021-01-29 HISTORY — DX: Other ventricular tachycardia: I47.29

## 2021-01-29 HISTORY — DX: Supraventricular tachycardia: I47.1

## 2021-01-29 HISTORY — DX: Supraventricular tachycardia, unspecified: I47.10

## 2021-01-29 HISTORY — DX: Other hypertrophic cardiomyopathy: I42.2

## 2021-01-29 HISTORY — DX: Ventricular tachycardia: I47.2

## 2021-01-29 HISTORY — DX: Ventricular premature depolarization: I49.3

## 2021-01-29 LAB — BASIC METABOLIC PANEL
Anion gap: 10 (ref 5–15)
BUN: 27 mg/dL — ABNORMAL HIGH (ref 8–23)
CO2: 26 mmol/L (ref 22–32)
Calcium: 9.6 mg/dL (ref 8.9–10.3)
Chloride: 101 mmol/L (ref 98–111)
Creatinine, Ser: 1.53 mg/dL — ABNORMAL HIGH (ref 0.61–1.24)
GFR, Estimated: 48 mL/min — ABNORMAL LOW (ref >60)
Glucose, Bld: 155 mg/dL — ABNORMAL HIGH (ref 70–99)
Potassium: 3.8 mmol/L (ref 3.5–5.1)
Sodium: 137 mmol/L (ref 135–145)

## 2021-01-29 LAB — URINALYSIS, ROUTINE W REFLEX MICROSCOPIC
Bilirubin Urine: NEGATIVE
Glucose, UA: 150 mg/dL — AB
Hgb urine dipstick: NEGATIVE
Ketones, ur: NEGATIVE mg/dL
Leukocytes,Ua: NEGATIVE
Nitrite: NEGATIVE
Protein, ur: 100 mg/dL — AB
Specific Gravity, Urine: 1.015 (ref 1.005–1.030)
pH: 5 (ref 5.0–8.0)

## 2021-01-29 LAB — CBC
HCT: 44.3 % (ref 39.0–52.0)
Hemoglobin: 14.4 g/dL (ref 13.0–17.0)
MCH: 29.6 pg (ref 26.0–34.0)
MCHC: 32.5 g/dL (ref 30.0–36.0)
MCV: 91.2 fL (ref 80.0–100.0)
Platelets: 251 10*3/uL (ref 150–400)
RBC: 4.86 MIL/uL (ref 4.22–5.81)
RDW: 12.7 % (ref 11.5–15.5)
WBC: 11.2 10*3/uL — ABNORMAL HIGH (ref 4.0–10.5)
nRBC: 0 % (ref 0.0–0.2)

## 2021-01-29 LAB — CBG MONITORING, ED: Glucose-Capillary: 142 mg/dL — ABNORMAL HIGH (ref 70–99)

## 2021-01-29 LAB — HEPATIC FUNCTION PANEL
ALT: 19 U/L (ref 0–44)
AST: 18 U/L (ref 15–41)
Albumin: 3.6 g/dL (ref 3.5–5.0)
Alkaline Phosphatase: 86 U/L (ref 38–126)
Bilirubin, Direct: <0.1 mg/dL (ref 0.0–0.2)
Total Bilirubin: 0.6 mg/dL (ref 0.3–1.2)
Total Protein: 7.1 g/dL (ref 6.5–8.1)

## 2021-01-29 LAB — BRAIN NATRIURETIC PEPTIDE: B Natriuretic Peptide: 13.4 pg/mL (ref 0.0–100.0)

## 2021-01-29 LAB — TROPONIN I (HIGH SENSITIVITY): Troponin I (High Sensitivity): 12 ng/L (ref <18)

## 2021-01-29 LAB — D-DIMER, QUANTITATIVE: D-Dimer, Quant: <0.27 ug/mL-FEU (ref 0.00–0.50)

## 2021-01-29 NOTE — H&P (Incomplete)
History and Physical    Joshua [name] HGD:[id number] DOB: 04/01/1951 DOA: 01/29/2021  PCP: Joshua Baton, MD Patient coming from: PCPs office  Chief Complaint: Generalized weakness  HPI: Joshua [name] is a 70 y.o. 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 Patient states he went to see his primary care physician last week and was feeling well.  For the past few days he has had severe fatigue.  He feels short of breath and dizzy when walking.  States he went to the emergency room 2 days ago and they told him his blood sugar was high in the 300s which has never happened before as he takes his insulin regularly.  States his oxygen dropped when they walked him in the emergency room.  He had some nausea earlier but did not vomit.  Denies fevers, abdominal pain, diarrhea, or dysuria.  He then went to see his PCP today and was told to come into the emergency room to be evaluated.  Patient reports history of PE about 6 years ago after a knee replacement surgery.  He has also had 2 strokes in the past and takes Eliquis.  He has not missed any doses of Eliquis.  States he has been told by his cardiologist in the past that his "heart wall is thickened."  He is fully vaccinated against COVID including booster shot.  Patient also states he had some mild dull pain behind his right eye for 2 days which has now resolved.  Denies blurry vision or change in his vision.  Not endorsing any symptoms of jaw claudication.  States he has had 3 falls in the past 2 weeks.  Denies head injury or any other injuries from these falls.  Denies any episodes of chest pain or syncope.  Patient was seen by cardiology 5 days ago for progressive dyspnea on exertion  which was felt to represent anginal equivalent.  He was scheduled for outpatient stress test to evaluate for ischemia.  He was previously on hydrochlorothiazide which was stopped due to complaint of lightheadedness which was felt to be due to diuretic use in the setting of hypertrophic cardiomyopathy.  Patient was seen at [facility] point ED 2 days ago for shortness of breath, weakness, confusion, and difficulty walking.  He endorsed urinary frequency but urinalysis was not suggestive of infection.  He was mildly hyperglycemic but not in DKA.  Troponin x2 negative.  Covid test was negative.  Chest x-ray showing no active disease.  Head CT negative for acute finding.  Patient declined hospital admission at that time.  He was seen at his PCPs office today and his sats dropped to 84% with ambulation.  He was sent to the ED to be evaluated.  ED Course: Vital signs stable.  Not hypoxic at rest.  Labs showing WBC 11.2, hemoglobin 14.4, platelet count 251K.  Sodium 137, potassium 3.8, chloride 101, bicarb 26, BUN 27, creatinine 1.5 (baseline 1.3-1.5), glucose 155.  UA not suggestive of infection.  LFTs normal.  BNP normal.  Initial high-sensitivity troponin negative, repeat pending.  EKG without acute changes.  D-dimer negative.  SARS-CoV-2 PCR test pending.  Chest x-ray showing normal pulmonary vascularity.  Low lung volumes with mild bibasilar atelectasis.  No focal consolidation, pleural effusion, or pneumothorax.  Head CT  negative for acute intracranial abnormality.  CT C-spine negative for acute finding.  Review of Systems:  All systems reviewed and apart from history of presenting illness, are negative.  Past Medical History:  Diagnosis Date  . Benign essential tremor   . Benign positional vertigo   . CVA (cerebral vascular accident) ([place])    x2 - L retina, 1 right parietal  . Degenerative arthritis   . Depression   . Diabetes mellitus   . Dyslipidemia   . GERD (gastroesophageal  reflux disease)    hiatal hernia  . Gout   . H/O: vasectomy   . Hearing aid worn    b/l  . Hx of appendectomy   . Hx of tonsillectomy   . Hypertension   . 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 Melanoma ([place])   . Obesity   . OSA on CPAP    setting = 5  . Tremor, essential 06/22/2017  . Wears glasses     Past Surgical History:  Procedure Laterality Date  . APPENDECTOMY    . arthroscopic knee surgery Bilateral   . CATARACT EXTRACTION Bilateral   . COLONOSCOPY    . MINOR PLACEMENT OF FIDUCIAL N/A 06/30/2019   Procedure: Fiducial placement;  Surgeon: Erline Levine, MD;  Location: [facility];  Service: Neurosurgery;  Laterality: N/A;  Fiducial placement  . NASAL SEPTUM SURGERY    . PULSE GENERATOR IMPLANT N/A 07/14/2019   Procedure: Left cranial Implanted Pulse Generator and lead extension placement to right chest ;  Surgeon: Erline Levine, MD;  Location: [facility];  Service: Neurosurgery;  Laterality: N/A;  . SUBTHALAMIC STIMULATOR INSERTION Left 07/07/2019   Procedure: LEFT DEEP BRAIN STIMULATOR PLACEMENT;  Surgeon: Erline Levine, MD;  Location: [facility];  Service: Neurosurgery;  Laterality: Left;  . TONSILLECTOMY    . TOTAL KNEE ARTHROPLASTY Left 03/30/2017   Procedure: LEFT TOTAL KNEE ARTHROPLASTY;  Surgeon: Paralee Cancel, MD;  Location: [facility];  Service: Orthopedics;  Laterality: Left;  . WISDOM TOOTH EXTRACTION       reports that he quit smoking about 37 years ago. His smoking use included cigarettes. He has a 10.00 pack-year smoking history. He has never used smokeless tobacco. He reports current alcohol use of about 2.0 standard drinks of alcohol per week. He reports that he does not use drugs.  Allergies  Allergen Reactions  . Melatonin Other (See Comments)    dizzy    Family History  Problem Relation Age of Onset  . Cerebral aneurysm Mother   . Tremor Mother   . Alzheimer's disease Father   . Tremor Brother   . Tremor Maternal Uncle   . Diabetes Maternal Grandmother   .  Healthy Son   . Colon cancer Neg Hx     Prior to Admission medications   Medication Sig Start Date End Date Taking? Authorizing Provider  amLODipine (NORVASC) 10 MG tablet Take 1 tablet (10 mg total) by mouth at bedtime. 10/31/20  Yes Donato Heinz, MD  atorvastatin (LIPITOR) 80 MG tablet Take 1 tablet (80 mg total) by mouth daily at 6 PM. 08/31/19  Yes Danford, Suann Larry, MD  DULoxetine (CYMBALTA) 60 MG capsule Take 60 mg by mouth daily.   Yes [provider]  ELIQUIS 5 MG TABS tablet TAKE 1 TABLET BY MOUTH TWICE DAILY. Patient taking differently: Take 5 mg by mouth 2 (two) times daily. 12/05/20  Yes Donato Heinz, MD  fenofibrate 54 MG tablet Take 54 mg  by mouth daily.   Yes [provider]  furosemide (LASIX) 40 MG tablet Take 40 mg by mouth daily. 01/22/21  Yes [provider]  Insulin Glargine, 1 Unit Dial, (TOUJEO SOLOSTAR) 300 UNIT/ML SOPN Inject 85 Units into the skin 2 (two) times daily.   Yes [provider]  insulin lispro (HUMALOG) 100 UNIT/ML KwikPen Inject 15-25 Units into the skin See admin instructions. Inject 15 units subcutaneously after breakfast and 25 units after lunch and supper   Yes [provider]  losartan (COZAAR) 100 MG tablet Take 100 mg by mouth daily.   Yes [provider]  Lutein-Zeaxanthin 25-5 MG CAPS Take 1 tablet by mouth daily.   Yes [provider]  metFORMIN (GLUCOPHAGE) 1000 MG tablet Take 1,000 mg by mouth 2 (two) times daily with a meal.   Yes [provider]  mirtazapine (REMERON) 7.5 MG tablet Take 7.5 mg by mouth at bedtime.   Yes [provider]  Multiple Vitamin (MULTIVITAMIN WITH MINERALS) TABS tablet Take 1 tablet by mouth daily.   Yes [provider]  Omega-3 Fatty Acids (FISH OIL) 1000 MG CAPS Take 2,000 mg by mouth daily. Reported on 02/13/2016   Yes [provider]  [facility] VERIO test strip  09/13/18  Yes [provider]  Polyethyl Glycol-Propyl Glycol (SYSTANE OP) Place 1-2 drops into both eyes 4 (four) times daily as needed (dry eyes).    Yes [provider]  PRESCRIPTION MEDICATION Inhale into the lungs at bedtime. CPAP   Yes [provider]  SURE COMFORT PEN NEEDLES 31G X 5 MM [place]  09/12/19  Yes [provider]    Physical Exam: Vitals:   01/29/21 2130 01/29/21 2145 01/29/21 2200 01/29/21 2230  BP: (!) 163/96 (!) 159/97 (!) 163/91 (!) 138/99  Pulse: 84 84 83 86  Resp: _0 Temp:      SpO2: 96% 96% 97% 100%  Weight:      Height:        ***  Labs on Admission: I have personally reviewed following labs and imaging studies  CBC: Recent Labs  Lab 01/27/21 1247 01/29/21 1817  WBC 8.3 11.2*  NEUTROABS 4.7  --   HGB 15.3 14.4  HCT 45.8 44.3  MCV 89.6 91.2  PLT 269 811   Basic Metabolic Panel: Recent Labs  Lab 01/27/21 1247 01/29/21 1817  NA 139 137  K 4.1 3.8  CL 102 101  CO2 25 26  GLUCOSE 382* 155*  BUN 27* 27*  CREATININE 1.54* 1.53*  CALCIUM 10.1 9.6   GFR: Estimated Creatinine Clearance: 61.3 mL/min (A) (by C-G formula based on SCr of 1.53 mg/dL (H)). Liver Function Tests: Recent Labs  Lab 01/27/21 1247 01/29/21 2021  AST 18 18  ALT 21 19  ALKPHOS 91 86  BILITOT 0.5 0.6  PROT 7.8 7.1  ALBUMIN 4.3 3.6   No results for input(s): LIPASE, AMYLASE in the last 168 hours. No results for input(s): AMMONIA in the last 168 hours. Coagulation Profile: Recent Labs  Lab 01/27/21 1247  INR 1.0   Cardiac Enzymes: No results for input(s): CKTOTAL, CKMB, CKMBINDEX, TROPONINI in the last 168 hours. BNP (last 3 results) No results for input(s): PROBNP in the last 8760 hours. HbA1C: No results for input(s): HGBA1C in the last 72 hours. CBG: Recent Labs  Lab 01/27/21 1306 01/27/21 1523 01/29/21 1856  GLUCAP 372* 314* 142*   Lipid Profile: No results for input(s): CHOL, HDL,  LDLCALC, TRIG, CHOLHDL, LDLDIRECT in the last 72  hours. Thyroid Function Tests: No results for input(s): TSH, T4TOTAL, FREET4, T3FREE, THYROIDAB in the last 72 hours. Anemia Panel: No results for input(s): VITAMINB12, FOLATE, FERRITIN, TIBC, IRON, RETICCTPCT in the last 72 hours. Urine analysis:    Component Value Date/Time   COLORURINE YELLOW 01/29/2021 1930   APPEARANCEUR CLEAR 01/29/2021 1930   LABSPEC 1.015 01/29/2021 1930   PHURINE 5.0 01/29/2021 1930   GLUCOSEU 150 (A) 01/29/2021 1930   HGBUR NEGATIVE 01/29/2021 1930   BILIRUBINUR NEGATIVE 01/29/2021 1930   KETONESUR NEGATIVE 01/29/2021 1930   PROTEINUR 100 (A) 01/29/2021 1930   UROBILINOGEN 0.2 04/08/2011 0732   NITRITE NEGATIVE 01/29/2021 1930   LEUKOCYTESUR NEGATIVE 01/29/2021 1930    Radiological Exams on Admission: DG Chest 2 View  Result Date: 01/29/2021 CLINICAL DATA:  Hypoxia. EXAM: CHEST - 2 VIEW COMPARISON:  Chest x-ray dated January 27, 2021. FINDINGS: The heart size and mediastinal contours are within normal limits. Normal pulmonary vascularity. Low lung volumes with mild bibasilar atelectasis. No focal consolidation, pleural effusion, or pneumothorax. No acute osseous abnormality. Unchanged stimulator pack overlying the right chest. IMPRESSION: 1. Low lung volumes with mild bibasilar atelectasis. Electronically Signed   By: Titus Dubin M.D.   On: 01/29/2021 18:43   CT Head Wo Contrast  Result Date: 01/29/2021 CLINICAL DATA:  Head trauma, minor (Age >= 65y) multiple falls on eliquis, progressive generalized weakness with right sided headache since sunday EXAM: CT HEAD WITHOUT CONTRAST TECHNIQUE: Contiguous axial images were obtained from the base of the skull through the vertex without intravenous contrast. COMPARISON:  Head CT 2 days ago 01/27/2021 FINDINGS: Brain: Deep brain stimulator from a left frontal approach unchanged with tip in the deep basal ganglia. No intracranial hemorrhage, mass effect, or midline shift. No hydrocephalus. The basilar cisterns are patent.  Stable degree of periventricular white matter hypodensity typical of chronic small vessel ischemia. Punctate right basal ganglia lacunar infarct. No evidence of territorial infarct or acute ischemia. No extra-axial or intracranial fluid collection. Vascular: Atherosclerosis of skullbase vasculature without hyperdense vessel or abnormal calcification. Skull: No fracture or focal lesion. Sinuses/Orbits: Bilateral cataract resection. Metallic density within the inferior left orbit, unchanged. Punctate metallic density just lateral to the right orbit. Paranasal sinuses are clear. Mastoid air cells are well aerated. Other: None. IMPRESSION: 1. No acute intracranial abnormality. No skull fracture. 2. Stable chronic small vessel ischemia. Deep brain stimulator in place. Electronically Signed   By: Keith Rake M.D.   On: 01/29/2021 20:12   CT Cervical Spine Wo Contrast  Result Date: 01/29/2021 CLINICAL DATA:  Neck trauma (Age >= 65y) EXAM: CT CERVICAL SPINE WITHOUT CONTRAST TECHNIQUE: Multidetector CT imaging of the cervical spine was performed without intravenous contrast. Multiplanar CT image reconstructions were also generated. COMPARISON:  None. FINDINGS: Alignment: Normal. Skull base and vertebrae: No acute fracture. Dens and skull base are intact. No destructive process. Soft tissues and spinal canal: No prevertebral fluid or swelling. No visible canal hematoma. Disc levels: Anterior spurring at multiple levels. Mild disc space narrowing and posterior spurring at C5-C6. Scattered facet hypertrophy. Upper chest: No acute or unexpected findings. Other: Lead from deep brain stimulator courses in the right soft tissues. IMPRESSION: Mild degenerative change in the cervical spine without acute fracture or subluxation. Electronically Signed   By: Keith Rake M.D.   On: 01/29/2021 20:16    EKG: Independently reviewed.  Sinus rhythm with first-degree AV block, LAFB, LBBB.  No significant change since  prior  tracing.  Assessment/Plan Active Problems:   Acute hypoxemic respiratory failure (HCC)   Dyspnea on exertion Acute hypoxemic respiratory failure Sats dropped to 84% with ambulation at PCPs office.  At present, not hypoxemic and no signs of respiratory distress.  Lungs clear on exam. Chest x-ray showing normal pulmonary vascularity.  Low lung volumes with mild bibasilar atelectasis.  No focal consolidation, pleural effusion, or pneumothorax.  PE less likely as he is not tachycardic.  He is on chronic anticoagulation with Eliquis and reports compliance.  His D-dimer is negative.  He has a history of hypertrophic cardiomyopathy.  He was recently seen by cardiology for progressive dyspnea on exertion and symptoms were felt to represent anginal equivalent.  He was scheduled for outpatient stress test.  At present, he is not endorsing any chest pain.  Initial high-sensitivity troponin negative, repeat pending.  EKG without acute changes. -Continuous pulse ox, supplemental oxygen as needed.  TTE ordered.  Cardiology consulted (I have sent an in basket message to Gay Filler).  Echo done 08/29/2019: "1. Technically difficult study. Left ventricular ejection fraction  appears grossly normal, approximately 55-60%, though difficult  visualization even with contrast  2. There is asymmetric basal septal hypertrophy measuring 18 mm in basal  septum (12 mm posterior wall). Consider cardiac MRI to assess for  hypertrophic cardiomyopathy if clinically indicated  3. Definity contrast agent was given IV to delineate the left ventricular  endocardial borders.  4. Global right ventricle has normal systolic function.The right  ventricular size is normal. No increase in right ventricular wall  thickness.  5. There is mild dilatation of the aortic root measuring 39 mm.  6. The inferior vena cava is dilated in size with <50% respiratory  variability, suggesting right atrial pressure of 15  mmHg."  Hypertrophic cardiomyopathy -Cardiology consulted and repeat echocardiogram ordered as mentioned above.  Cardiac MRI done 10/05/2019: "IMPRESSION: 1. Limited study, as only a few sequences were able to be completed due to limitations from presence of deep brain stimulator  2. Basal septal hypertrophy measuring up to 47m (lateral wall 12 mm), consistent with hypertrophic cardiomyopathy  3. Patchy late gadolinium enhancement in basal septum, consistent with HCM  4. Basal inferolateral midwall LGE, which would not be a typical pattern for HCM, as more commonly seen in setting of prior myocarditis or sarcoidosis. Fabry's disease is associated with asymmetric hypertrophy and basal inferolateral LGE  5.  Normal LV size with hyperdynamic systolic function (EF 662%  6.  Normal RV size and systolic function (EF 656%"  Generalized weakness: No fever or significant leukocytosis on labs.  Chest x-ray not suggestive of pneumonia.  UA without signs of infection.  Head CT negative for acute finding.  Suspect weakness is due to cardiac etiology. -Work-up as mentioned above.  Also check TSH level.  PT/OT eval, fall precautions.  Right eye pain: Patient states he had some mild dull pain behind his right eye for 2 days which has now resolved.  GCA less likely as he denies blurry vision or change in his vision.  Not endorsing any symptoms of jaw claudication.  Afebrile. -Check ESR and CRP levels  A1c 7.8 in 12/21 per note from PCPs office.  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 Patient states he went to see his primary care physician last week  and was feeling well.  For the past few days he has had severe fatigue.  He feels short of breath and dizzy when  walking.  States he went to the emergency room 2 days ago and they told him his blood sugar was high in the 300s which has never happened before as he takes his insulin regularly.  States his oxygen dropped when they walked him in the emergency room.  He had some nausea earlier but did not vomit.  Denies fevers, abdominal pain, diarrhea, or dysuria.  He then went to see his PCP today and was told to come into the emergency room to be evaluated.  Patient reports history of PE about 6 years ago after a knee replacement surgery.  He has also had 2 strokes in the past and takes Eliquis.  He has not missed any doses of Eliquis.  States he has been told by his cardiologist in the past that his "heart wall is thickened."  He is fully vaccinated against COVID including booster shot.   States he has had 3 falls in the past 2 weeks.  Denies head injury or any other injuries from these falls.  Denies any episodes of chest pain or syncope.  Patient was seen by cardiology 5 days ago for progressive dyspnea on exertion which was felt to represent anginal equivalent.  He was scheduled for outpatient stress test to evaluate for ischemia.  He was previously on hydrochlorothiazide which was stopped due to complaint of lightheadedness which was felt to be due to diuretic use in the setting of hypertrophic cardiomyopathy.  Patient was seen at [facility] point ED 2 days ago for shortness of breath, weakness, confusion, and difficulty walking.  He endorsed urinary frequency but urinalysis was not suggestive of infection.  He was mildly hyperglycemic but not in DKA.  Troponin x2 negative.  Covid test was negative.  Chest x-ray showing no active disease.  Head CT negative for acute finding.  Patient declined hospital admission at that time.  He was seen at his PCPs office today and his sats dropped to 84% with ambulation.  He was sent to the ED to be evaluated.  ED Course: Vital signs stable.  Not hypoxic at rest.  Labs  showing WBC 11.2, hemoglobin 14.4, platelet count 251K.  Sodium 137, potassium 3.8, chloride 101, bicarb 26, BUN 27, creatinine 1.5 (baseline 1.3-1.5), glucose 155.  UA not suggestive of infection.  LFTs normal.  BNP normal.  Initial high-sensitivity troponin negative, repeat pending.  EKG without acute changes.  D-dimer negative.  SARS-CoV-2 PCR test pending.    Head CT negative for acute intracranial abnormality.  CT C-spine negative for acute finding.    DVT prophylaxis: ***  Code Status: *** Family Communication: ***  Disposition Plan: Status is: Observation  {Observation:23811}  Dispo: The patient is from: {From:23814}              Anticipated d/c is to: {To:23815}              Patient currently {Medically stable:23817}   Difficult to place patient {Yes/No:25151}       Consults called: *** Admission status: *** Level of care: Level of care: Telemetry Medical The medical decision making on this patient was of high complexity and the patient is at high risk for clinical deterioration, therefore this is a level 3 visit.***  The medical decision making is of moderate complexity, therefore this is a level 2 visit.***  Shela Leff MD  [facility]  If 7PM-7AM, please contact night-coverage [email or web address]  01/29/2021, 11:00 PM

## 2021-01-29 NOTE — Progress Notes (Signed)
Subjective: 70 year old male presents the office today for diabetic foot evaluation and for elongated nails that he cannot trim himself as well as calluses.  Denies any open sores and denies any increase in swelling or redness.  He has no other lower extremity concerns today.  He was recently in the emergency room with a week and for increased blood pressure he has follow-up scheduled for this.  Objective: AAO x3, NAD DP/PT pulses palpable bilaterally, CRT less than 3 seconds Sensation decreased with Semmes Weinstein monofilament  Nails are hypertrophic, dystrophic, brittle, discolored, elongated 10. No surrounding redness or drainage. Tenderness nails 1-5 bilaterally.  Cavus foot type is present resulting in plantarflexed first ray and prominent fifth metatarsal head resulting in minimal hyperkeratotic tissue.  There is no underlying ulcerations identified.  There is no edema, erythema.  Spurring present the dorsal midfoot from arthritic changes.  No other areas of discomfort.  MMT 5/5.  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  -Debrided hyperkeratotic lesions x4 without any complications or bleeding -Sharply debrided the toenails x10 without any complications or bleeding -Glucose control  -Patient encouraged to call the office with any questions, concerns, change in symptoms.   RTC 3 months  Trula Slade DPM

## 2021-01-29 NOTE — ED Triage Notes (Signed)
Patient arrived via EMS; reported arrived from the doctor's office. Was being seen for ER follow up, seen over the weekend at [facility] ED. C/o generalized weakness, shortness of breath with exertion, right eye pain w/o vision changes. EMS endorsed doctor's office reported ambulatory Sp02 of 85 %. Pt stated he has a [employer] stimulator on right upper chest wall that has wires connected to his head,

## 2021-01-29 NOTE — H&P (Signed)
History and Physical    Joshua [name] JHH:[id number] DOB: 01-Feb-1951 DOA: 01/29/2021  PCP: [name], MD Patient coming from: PCPs office  Chief Complaint: Generalized weakness  HPI: Joshua [name] is a 70 y.o. male with medical history significant of hypertrophic cardiomyopathy, hypertension, insulin-dependent type 2 diabetes, OSA on CPAP, CKD stage IIIa, PE after knee surgery, essential tremor status post deep brain stimulator [id number], cardioembolic CVA on Eliquis presenting with a chief complaint of generalized weakness and severe fatigue. Patient states he went to see his primary care physician last week and was feeling well.  For the past few days he has had severe fatigue.  He feels short of breath and dizzy when walking.  States he went to the emergency room 2 days ago and they told him his blood sugar was high in the 300s which has never happened before as he takes his insulin regularly.  States his oxygen dropped when they walked him in the emergency room.  He had some nausea earlier but did not vomit.  Denies fevers, abdominal pain, diarrhea, or dysuria.  He then went to see his PCP today and was told to come into the emergency room to be evaluated.  Patient reports history of PE about 6 years ago after a knee replacement surgery.  He has also had 2 strokes in the past and takes Eliquis.  He has not missed any doses of Eliquis.  States he has been told by his cardiologist in the past that his "heart wall is thickened."  He is fully vaccinated against COVID including booster shot.  Patient also states he had some mild dull pain behind his right eye for 2 days which has now resolved.  Denies blurry vision or change in his vision.  Not endorsing any symptoms of jaw claudication.  States he has had 3 falls in the past 2 weeks.  Denies head injury or any other injuries from these falls.  Denies any episodes of chest pain or syncope.  Patient was seen by cardiology 5 days ago for progressive dyspnea  on exertion which was felt to represent anginal equivalent.  He was scheduled for outpatient stress test to evaluate for ischemia.  He was previously on hydrochlorothiazide which was stopped due to complaint of lightheadedness which was felt to be due to diuretic use in the setting of hypertrophic cardiomyopathy.  Patient was seen at [facility] ED 2 days ago for shortness of breath, weakness, confusion, and difficulty walking.  He endorsed urinary frequency but urinalysis was not suggestive of infection.  He was hyperglycemic but not in DKA.  Troponin x2 negative.  Covid test was negative.  Chest x-ray showing no active disease.  Head CT negative for acute finding.  Patient declined hospital admission at that time.  He was seen at his PCPs office today and his sats dropped to 84% with ambulation.  He was sent to the ED to be evaluated.  ED Course: Vital signs stable.  Not hypoxic at rest.  Labs showing WBC 11.2, hemoglobin 14.4, platelet count 251K.  Sodium 137, potassium 3.8, chloride 101, bicarb 26, BUN 27, creatinine 1.5 (baseline 1.3-1.5), glucose 155.  UA not suggestive of infection.  LFTs normal.  BNP normal.  Initial high-sensitivity troponin negative, repeat pending.  EKG without acute changes.  D-dimer negative.  SARS-CoV-2 PCR test pending.  Chest x-ray showing normal pulmonary vascularity.  Low lung volumes with mild bibasilar atelectasis.  No focal consolidation, pleural effusion, or pneumothorax.  Head CT negative for acute intracranial abnormality.  CT C-spine negative for acute finding.  Review of Systems:  All systems reviewed and apart from history of presenting illness, are negative.  Past Medical History:  Diagnosis Date  . Benign essential tremor   . Benign positional vertigo   . CVA (cerebral vascular accident) ([place])    x2 - L retina, 1 right parietal  . Degenerative arthritis   . Depression   . Diabetes mellitus   . Dyslipidemia   . GERD  (gastroesophageal reflux disease)    hiatal hernia  . Gout   . H/O: vasectomy   . Hearing aid worn    b/l  . Hx of appendectomy   . Hx of tonsillectomy   . Hypertension   . 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 Melanoma ([place])   . Obesity   . OSA on CPAP    setting = 5  . Tremor, essential 06/22/2017  . Wears glasses     Past Surgical History:  Procedure Laterality Date  . APPENDECTOMY    . arthroscopic knee surgery Bilateral   . CATARACT EXTRACTION Bilateral   . COLONOSCOPY    . MINOR PLACEMENT OF FIDUCIAL N/A 06/30/2019   Procedure: Fiducial placement;  Surgeon: Erline Levine, MD;  Location: [place];  Service: Neurosurgery;  Laterality: N/A;  Fiducial placement  . NASAL SEPTUM SURGERY    . PULSE GENERATOR IMPLANT N/A 07/14/2019   Procedure: Left cranial Implanted Pulse Generator and lead extension placement to right chest ;  Surgeon: Erline Levine, MD;  Location: [place];  Service: Neurosurgery;  Laterality: N/A;  . SUBTHALAMIC STIMULATOR INSERTION Left 07/07/2019   Procedure: LEFT DEEP BRAIN STIMULATOR PLACEMENT;  Surgeon: Erline Levine, MD;  Location: [place];  Service: Neurosurgery;  Laterality: Left;  . TONSILLECTOMY    . TOTAL KNEE ARTHROPLASTY Left 03/30/2017   Procedure: LEFT TOTAL KNEE ARTHROPLASTY;  Surgeon: Paralee Cancel, MD;  Location: [facility];  Service: Orthopedics;  Laterality: Left;  . WISDOM TOOTH EXTRACTION       reports that he quit smoking about 37 years ago. His smoking use included cigarettes. He has a 10.00 pack-year smoking history. He has never used smokeless tobacco. He reports current alcohol use of about 2.0 standard drinks of alcohol per week. He reports that he does not use drugs.  Allergies  Allergen Reactions  . Melatonin Other (See Comments)    dizzy    Family History  Problem Relation Age of Onset  . Cerebral aneurysm Mother   . Tremor Mother   . Alzheimer's disease Father   . Tremor Brother   . Tremor Maternal Uncle   . Diabetes Maternal  Grandmother   . Healthy Son   . Colon cancer Neg Hx     Prior to Admission medications   Medication Sig Start Date End Date Taking? Authorizing Provider  amLODipine (NORVASC) 10 MG tablet Take 1 tablet (10 mg total) by mouth at bedtime. 10/31/20  Yes Donato Heinz, MD  atorvastatin (LIPITOR) 80 MG tablet Take 1 tablet (80 mg total) by mouth daily at 6 PM. 08/31/19  Yes Danford, Suann Larry, MD  DULoxetine (CYMBALTA) 60 MG capsule Take 60 mg by mouth daily.   Yes [provider]  ELIQUIS 5 MG TABS tablet TAKE 1 TABLET BY MOUTH TWICE DAILY. Patient taking differently: Take 5 mg by mouth 2 (two) times daily. 12/05/20  Yes Donato Heinz, MD  fenofibrate 54 MG tablet Take  54 mg by mouth daily.   Yes [provider]  furosemide (LASIX) 40 MG tablet Take 40 mg by mouth daily. 01/22/21  Yes [provider]  Insulin Glargine, 1 Unit Dial, (TOUJEO SOLOSTAR) 300 UNIT/ML SOPN Inject 85 Units into the skin 2 (two) times daily.   Yes [provider]  insulin lispro (HUMALOG) 100 UNIT/ML KwikPen Inject 15-25 Units into the skin See admin instructions. Inject 15 units subcutaneously after breakfast and 25 units after lunch and supper   Yes [provider]  losartan (COZAAR) 100 MG tablet Take 100 mg by mouth daily.   Yes [provider]  Lutein-Zeaxanthin 25-5 MG CAPS Take 1 tablet by mouth daily.   Yes [provider]  metFORMIN (GLUCOPHAGE) 1000 MG tablet Take 1,000 mg by mouth 2 (two) times daily with a meal.   Yes [provider]  mirtazapine (REMERON) 7.5 MG tablet Take 7.5 mg by mouth at bedtime.   Yes [provider]  Multiple Vitamin (MULTIVITAMIN WITH MINERALS) TABS tablet Take 1 tablet by mouth daily.   Yes [provider]  Omega-3 Fatty Acids (FISH OIL) 1000 MG CAPS Take 2,000 mg by mouth daily. Reported on 02/13/2016   Yes [provider]  [facility] VERIO test strip  09/13/18  Yes  [provider]  Polyethyl Glycol-Propyl Glycol (SYSTANE OP) Place 1-2 drops into both eyes 4 (four) times daily as needed (dry eyes).    Yes [provider]  PRESCRIPTION MEDICATION Inhale into the lungs at bedtime. CPAP   Yes [provider]  SURE COMFORT PEN NEEDLES 31G X 5 MM [place]  09/12/19  Yes [provider]    Physical Exam: Vitals:   01/29/21 2145 01/29/21 2200 01/29/21 2230 01/29/21 2330  BP: (!) 159/97 (!) 163/91 (!) 138/99 (!) 141/73  Pulse: 84 83 86 85  Resp: _0 Temp:      SpO2: 96% 97% 100% 96%  Weight:      Height:        Physical Exam Constitutional:      General: He is not in acute distress. HENT:     Head: Normocephalic and atraumatic.  Eyes:     Extraocular Movements: Extraocular movements intact.     Conjunctiva/sclera: Conjunctivae normal.  Cardiovascular:     Rate and Rhythm: Normal rate and regular rhythm.     Pulses: Normal pulses.  Pulmonary:     Effort: Pulmonary effort is normal. No respiratory distress.     Breath sounds: Normal breath sounds. No wheezing or rales.  Abdominal:     General: Bowel sounds are normal.     Palpations: Abdomen is soft.     Tenderness: There is no abdominal tenderness. There is no guarding.  Musculoskeletal:     Cervical back: Normal range of motion and neck supple.     Left lower leg: Edema present.  Skin:    General: Skin is warm and dry.  Neurological:     General: No focal deficit present.     Mental Status: He is alert and oriented to person, place, and time.     Labs on Admission: I have personally reviewed following labs and imaging studies  CBC: Recent Labs  Lab 01/27/21 1247 01/29/21 1817  WBC 8.3 11.2*  NEUTROABS 4.7  --   HGB 15.3 14.4  HCT 45.8 44.3  MCV 89.6 91.2  PLT 269 409   Basic Metabolic Panel: Recent Labs  Lab 01/27/21 1247 01/29/21  1817  NA 139 137  K 4.1 3.8  CL 102 101  CO2 25 26  GLUCOSE 382* 155*  BUN 27* 27*  CREATININE  1.54* 1.53*  CALCIUM 10.1 9.6   GFR: Estimated Creatinine Clearance: 61.3 mL/min (A) (by C-G formula based on SCr of 1.53 mg/dL (H)). Liver Function Tests: Recent Labs  Lab 01/27/21 1247 01/29/21 2021  AST 18 18  ALT 21 19  ALKPHOS 91 86  BILITOT 0.5 0.6  PROT 7.8 7.1  ALBUMIN 4.3 3.6   No results for input(s): LIPASE, AMYLASE in the last 168 hours. No results for input(s): AMMONIA in the last 168 hours. Coagulation Profile: Recent Labs  Lab 01/27/21 1247  INR 1.0   Cardiac Enzymes: No results for input(s): CKTOTAL, CKMB, CKMBINDEX, TROPONINI in the last 168 hours. BNP (last 3 results) No results for input(s): PROBNP in the last 8760 hours. HbA1C: No results for input(s): HGBA1C in the last 72 hours. CBG: Recent Labs  Lab 01/27/21 1306 01/27/21 1523 01/29/21 1856  GLUCAP 372* 314* 142*   Lipid Profile: No results for input(s): CHOL, HDL, LDLCALC, TRIG, CHOLHDL, LDLDIRECT in the last 72 hours. Thyroid Function Tests: No results for input(s): TSH, T4TOTAL, FREET4, T3FREE, THYROIDAB in the last 72 hours. Anemia Panel: No results for input(s): VITAMINB12, FOLATE, FERRITIN, TIBC, IRON, RETICCTPCT in the last 72 hours. Urine analysis:    Component Value Date/Time   COLORURINE YELLOW 01/29/2021 1930   APPEARANCEUR CLEAR 01/29/2021 1930   LABSPEC 1.015 01/29/2021 1930   PHURINE 5.0 01/29/2021 1930   GLUCOSEU 150 (A) 01/29/2021 1930   HGBUR NEGATIVE 01/29/2021 1930   BILIRUBINUR NEGATIVE 01/29/2021 1930   KETONESUR NEGATIVE 01/29/2021 1930   PROTEINUR 100 (A) 01/29/2021 1930   UROBILINOGEN 0.2 04/08/2011 0732   NITRITE NEGATIVE 01/29/2021 1930   LEUKOCYTESUR NEGATIVE 01/29/2021 1930    Radiological Exams on Admission: DG Chest 2 View  Result Date: 01/29/2021 CLINICAL DATA:  Hypoxia. EXAM: CHEST - 2 VIEW COMPARISON:  Chest x-ray dated January 27, 2021. FINDINGS: The heart size and mediastinal contours are within normal limits. Normal pulmonary vascularity. Low lung  volumes with mild bibasilar atelectasis. No focal consolidation, pleural effusion, or pneumothorax. No acute osseous abnormality. Unchanged stimulator pack overlying the right chest. IMPRESSION: 1. Low lung volumes with mild bibasilar atelectasis. Electronically Signed   By: Titus Dubin M.D.   On: 01/29/2021 18:43   CT Head Wo Contrast  Result Date: 01/29/2021 CLINICAL DATA:  Head trauma, minor (Age >= 65y) multiple falls on eliquis, progressive generalized weakness with right sided headache since sunday EXAM: CT HEAD WITHOUT CONTRAST TECHNIQUE: Contiguous axial images were obtained from the base of the skull through the vertex without intravenous contrast. COMPARISON:  Head CT 2 days ago 01/27/2021 FINDINGS: Brain: Deep brain stimulator from a left frontal approach unchanged with tip in the deep basal ganglia. No intracranial hemorrhage, mass effect, or midline shift. No hydrocephalus. The basilar cisterns are patent. Stable degree of periventricular white matter hypodensity typical of chronic small vessel ischemia. Punctate right basal ganglia lacunar infarct. No evidence of territorial infarct or acute ischemia. No extra-axial or intracranial fluid collection. Vascular: Atherosclerosis of skullbase vasculature without hyperdense vessel or abnormal calcification. Skull: No fracture or focal lesion. Sinuses/Orbits: Bilateral cataract resection. Metallic density within the inferior left orbit, unchanged. Punctate metallic density just lateral to the right orbit. Paranasal sinuses are clear. Mastoid air cells are well aerated. Other: None. IMPRESSION: 1. No acute intracranial abnormality. No skull fracture. 2. Stable  chronic small vessel ischemia. Deep brain stimulator in place. Electronically Signed   By: Keith Rake M.D.   On: 01/29/2021 20:12   CT Cervical Spine Wo Contrast  Result Date: 01/29/2021 CLINICAL DATA:  Neck trauma (Age >= 65y) EXAM: CT CERVICAL SPINE WITHOUT CONTRAST TECHNIQUE:  Multidetector CT imaging of the cervical spine was performed without intravenous contrast. Multiplanar CT image reconstructions were also generated. COMPARISON:  None. FINDINGS: Alignment: Normal. Skull base and vertebrae: No acute fracture. Dens and skull base are intact. No destructive process. Soft tissues and spinal canal: No prevertebral fluid or swelling. No visible canal hematoma. Disc levels: Anterior spurring at multiple levels. Mild disc space narrowing and posterior spurring at C5-C6. Scattered facet hypertrophy. Upper chest: No acute or unexpected findings. Other: Lead from deep brain stimulator courses in the right soft tissues. IMPRESSION: Mild degenerative change in the cervical spine without acute fracture or subluxation. Electronically Signed   By: Keith Rake M.D.   On: 01/29/2021 20:16    EKG: Independently reviewed.  Sinus rhythm with first-degree AV block, LAFB, LBBB.  No significant change since prior tracing.  Assessment/Plan Principal Problem:   Acute hypoxemic respiratory failure (HCC) Active Problems:   Essential hypertension   Hyperlipidemia   Chronic kidney disease   DOE (dyspnea on exertion)   Dyspnea on exertion Acute hypoxemic respiratory failure Sats dropped to 84% with ambulation at PCPs office.  At present, not hypoxemic and no signs of respiratory distress.  Lungs clear on exam. Chest x-ray showing normal pulmonary vascularity.  Low lung volumes with mild bibasilar atelectasis.  No focal consolidation, pleural effusion, or pneumothorax.  SARS-CoV-2 PCR test pending but Covid infection felt to be less likely as he is fully vaccinated and had a negative Covid test during his ED visit 2 days ago.  PE less likely as he is not tachycardic.  He is on chronic anticoagulation with Eliquis and reports compliance.  His D-dimer is negative.  He has a history of hypertrophic cardiomyopathy.  He was recently seen by cardiology for progressive dyspnea on exertion and  symptoms were felt to represent anginal equivalent.  He was scheduled for outpatient stress test.  At present, he is not endorsing any chest pain.  Initial high-sensitivity troponin negative, repeat pending.  EKG without acute changes. -Continuous pulse ox, supplemental oxygen as needed.  TTE ordered.  Cardiology consulted (I have sent an in basket message to Gay Filler).  Echo done 08/29/2019: "1. Technically difficult study. Left ventricular ejection fraction  appears grossly normal, approximately 55-60%, though difficult  visualization even with contrast  2. There is asymmetric basal septal hypertrophy measuring 18 mm in basal  septum (12 mm posterior wall). Consider cardiac MRI to assess for  hypertrophic cardiomyopathy if clinically indicated  3. Definity contrast agent was given IV to delineate the left ventricular  endocardial borders.  4. Global right ventricle has normal systolic function.The right  ventricular size is normal. No increase in right ventricular wall  thickness.  5. There is mild dilatation of the aortic root measuring 39 mm.  6. The inferior vena cava is dilated in size with <50% respiratory  variability, suggesting right atrial pressure of 15 mmHg."  Hypertrophic cardiomyopathy -Cardiology consulted and repeat echocardiogram ordered as mentioned above.  Cardiac MRI done 10/05/2019: "IMPRESSION: 1. Limited study, as only a few sequences were able to be completed due to limitations from presence of deep brain stimulator  2. Basal septal hypertrophy measuring up to 52m (lateral wall 12 mm),  consistent with hypertrophic cardiomyopathy  3. Patchy late gadolinium enhancement in basal septum, consistent with HCM  4. Basal inferolateral midwall LGE, which would not be a typical pattern for HCM, as more commonly seen in setting of prior myocarditis or sarcoidosis. Fabry's disease is associated with asymmetric hypertrophy and basal inferolateral  LGE  5.  Normal LV size with hyperdynamic systolic function (EF 12%)  6.  Normal RV size and systolic function (EF 75%)"  Generalized weakness Falls No fever or significant leukocytosis on labs.  Chest x-ray not suggestive of pneumonia.  UA without signs of infection.  Head CT negative for acute finding.  Suspect weakness is due to cardiac etiology. -Work-up as mentioned above.  Also check TSH level.  PT/OT eval, fall precautions.  Right eye pain: Patient states he had some mild dull pain behind his right eye for 2 days which has now resolved.  GCA less likely as he denies blurry vision or change in his vision.  Not endorsing any symptoms of jaw claudication.  Afebrile. -Check ESR and CRP levels  Asymmetric lower extremity edema: Patient noted to have mild pitting edema of the left lower extremity.  No erythema or warmth to touch.  DVT less likely as he is on Eliquis and reports compliance.  D-dimer negative.  He was previously on hydrochlorothiazide which was stopped by cardiology due to complaint of lightheadedness which was felt to be due to diuretic use in the setting of hypertrophic cardiomyopathy.  Currently on Lasix 40 mg daily. -Continue home Lasix  Hypertension: Systolic in the 170Y to 174B at this time. -Continue amlodipine, Lasix, and losartan  Insulin-dependent type 2 diabetes: A1c 7.8 in 10/2020 per note from PCPs office. -Repeat A1c.  Hold home Metformin.  Continue home long-acting insulin.  Order sliding scale insulin moderate ACHS.   OSA -Continue CPAP at night  History of cardioembolic CVA -Continue Eliquis, statin  CKD stage IIIa: Creatinine 1.5, at baseline. -Continue to monitor  Hyperlipidemia -Continue Lipitor, fenofibrate  DVT prophylaxis: Eliquis Code Status: Patient wishes to be full code. Family Communication: No family available at this time.  Diagnostic findings and treatment plan discussed with the patient in detail. Disposition Plan: Status is:  Observation  The patient remains OBS appropriate and will d/c before 2 midnights.  Dispo: The patient is from: Home              Anticipated d/c is to: Home              Patient currently is not medically stable to d/c.   Difficult to place patient No  Level of care: Level of care: Telemetry Medical   The medical decision making on this patient was of high complexity and the patient is at high risk for clinical deterioration, therefore this is a level 3 visit.  Shela Leff MD [facility]  If 7PM-7AM, please contact night-coverage [email or web address]  01/30/2021, 12:30 AM

## 2021-01-29 NOTE — ED Provider Notes (Signed)
[facility] EMERGENCY DEPARTMENT Provider Note   CSN: [id number] Arrival date & time: 01/29/21  1805     History Chief Complaint  Patient presents with  . Weakness  . Eye Pain    Joshua [name] is a 70 y.o. male.  The history is provided by the patient, medical records and the spouse.  Weakness Eye Pain   Joshua [name] is a 70 y.o. male who presents to the Emergency Department complaining of fatigue. He presents the emergency department accompanied by his wife for evaluation of progressive fatigue and malaise. Last Thursday he saw his cardiologist and had a normal blood pressure on that evaluation. On Sunday he awoke with palpitations and was found to be hypertensive 192/117. Since that time he has been experiencing fatigue, mild right-sided headache, generalized weakness and dyspnea on exertion. He was seen in the emergency department and evaluated and discharged on Sunday. He went to his primary care provider today for reassessment and was referred to the emergency department for further evaluation. He has associated nausea. He denies any fevers, chest pain, cough, vomiting, abdominal pain, diarrhea. He does have minimal swelling to his lower extremities. Wife also states that he has been having increased difficulty walking for the last several weeks and seems to be more unsteady when he attempts to ambulate. He went to see his primary care doctor, Dr. [name] with [facility]. On evaluation in the office he had oxygen saturations of 90% at rest but dropped to 84% on ambulation.    Past Medical History:  Diagnosis Date  . Benign essential tremor   . Benign positional vertigo   . CVA (cerebral vascular accident) ([place])    x2 - L retina, 1 right parietal  . Degenerative arthritis   . Depression   . Diabetes mellitus   . Dyslipidemia   . GERD (gastroesophageal reflux disease)    hiatal hernia  . Gout   . H/O: vasectomy   . Hearing aid worn     b/l  . Hx of appendectomy   . Hx of tonsillectomy   . Hypertension   . 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 Melanoma ([place])   . Obesity   . OSA on CPAP    setting = 5  . Tremor, essential 06/22/2017  . Wears glasses     Patient Active Problem List   Diagnosis Date Noted  . Acute hypoxemic respiratory failure ([place]) 01/29/2021  . Bilateral hearing loss 05/15/2020  . Bilateral impacted cerumen 05/15/2020  . Fungal otitis externa 05/15/2020  . Cerebral embolism with cerebral infarction 08/29/2019  . 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 Hyperlipidemia 08/28/2019  . Chronic kidney disease 08/28/2019  . Obesity (BMI 30-39.9) 08/28/2019  . Tremor 07/07/2019  . Essential tremor 06/22/2017  . OSA on CPAP 04/10/2017  . Essential hypertension 04/10/2017  . Pulmonary thromboembolism ([place]) 04/10/2017  . PE (pulmonary thromboembolism) ([place]) 04/09/2017  . S/P left TKA 03/30/2017  . S/P total knee replacement 03/30/2017  . Cellulitis of left foot 03/07/2016  . Pre-ulcerative calluses 03/07/2016  . Type 2 diabetes mellitus with left diabetic foot ulcer ([place]) 01/14/2016  . Diabetes mellitus type 2 in obese ([place]) 08/07/2008  . GERD 08/07/2008    Past Surgical History:  Procedure Laterality Date  . APPENDECTOMY    . arthroscopic knee surgery Bilateral   . CATARACT EXTRACTION Bilateral   . COLONOSCOPY    . MINOR PLACEMENT OF FIDUCIAL N/A  06/30/2019   Procedure: Fiducial placement;  Surgeon: Erline Levine, MD;  Location: [facility];  Service: Neurosurgery;  Laterality: N/A;  Fiducial placement  . NASAL SEPTUM SURGERY    . PULSE GENERATOR IMPLANT N/A 07/14/2019   Procedure: Left cranial Implanted Pulse Generator and lead extension placement to right chest ;  Surgeon: Erline Levine, MD;  Location: [facility];  Service: Neurosurgery;  Laterality: N/A;  . SUBTHALAMIC STIMULATOR INSERTION Left 07/07/2019   Procedure: LEFT DEEP BRAIN STIMULATOR PLACEMENT;  Surgeon: Erline Levine, MD;   Location: [facility];  Service: Neurosurgery;  Laterality: Left;  . TONSILLECTOMY    . TOTAL KNEE ARTHROPLASTY Left 03/30/2017   Procedure: LEFT TOTAL KNEE ARTHROPLASTY;  Surgeon: Paralee Cancel, MD;  Location: [facility];  Service: Orthopedics;  Laterality: Left;  . WISDOM TOOTH EXTRACTION         Family History  Problem Relation Age of Onset  . Cerebral aneurysm Mother   . Tremor Mother   . Alzheimer's disease Father   . Tremor Brother   . Tremor Maternal Uncle   . Diabetes Maternal Grandmother   . Healthy Son   . Colon cancer Neg Hx     Social History   Tobacco Use  . Smoking status: Former Smoker    Packs/day: 1.00    Years: 10.00    Pack years: 10.00    Types: Cigarettes    Quit date: 11/25/1983    Years since quitting: 37.2  . Smokeless tobacco: Never Used  Vaping Use  . Vaping Use: Never used  Substance Use Topics  . Alcohol use: Yes    Alcohol/week: 2.0 standard drinks    Types: 2 Standard drinks or equivalent per week    Comment: occassionally  . Drug use: No    Home Medications Prior to Admission medications   Medication Sig Start Date End Date Taking? Authorizing Provider  amLODipine (NORVASC) 10 MG tablet Take 1 tablet (10 mg total) by mouth at bedtime. 10/31/20  Yes Donato Heinz, MD  atorvastatin (LIPITOR) 80 MG tablet Take 1 tablet (80 mg total) by mouth daily at 6 PM. 08/31/19  Yes Danford, Suann Larry, MD  DULoxetine (CYMBALTA) 60 MG capsule Take 60 mg by mouth daily.   Yes [provider]  ELIQUIS 5 MG TABS tablet TAKE 1 TABLET BY MOUTH TWICE DAILY. Patient taking differently: Take 5 mg by mouth 2 (two) times daily. 12/05/20  Yes Donato Heinz, MD  fenofibrate 54 MG tablet Take 54 mg by mouth daily.   Yes [provider]  furosemide (LASIX) 40 MG tablet Take 40 mg by mouth daily. 01/22/21  Yes [provider]  Insulin Glargine, 1 Unit Dial, (TOUJEO SOLOSTAR) 300 UNIT/ML SOPN Inject 85 Units into the skin 2 (two) times  daily.   Yes [provider]  insulin lispro (HUMALOG) 100 UNIT/ML KwikPen Inject 15-25 Units into the skin See admin instructions. Inject 15 units subcutaneously after breakfast and 25 units after lunch and supper   Yes [provider]  losartan (COZAAR) 100 MG tablet Take 100 mg by mouth daily.   Yes [provider]  Lutein-Zeaxanthin 25-5 MG CAPS Take 1 tablet by mouth daily.   Yes [provider]  metFORMIN (GLUCOPHAGE) 1000 MG tablet Take 1,000 mg by mouth 2 (two) times daily with a meal.   Yes [provider]  mirtazapine (REMERON) 7.5 MG tablet Take 7.5 mg by mouth at bedtime.   Yes [provider]  Multiple Vitamin (MULTIVITAMIN  WITH MINERALS) TABS tablet Take 1 tablet by mouth daily.   Yes [provider]  Omega-3 Fatty Acids (FISH OIL) 1000 MG CAPS Take 2,000 mg by mouth daily. Reported on 02/13/2016   Yes [provider]  [facility] VERIO test strip  09/13/18  Yes [provider]  Polyethyl Glycol-Propyl Glycol (SYSTANE OP) Place 1-2 drops into both eyes 4 (four) times daily as needed (dry eyes).    Yes [provider]  PRESCRIPTION MEDICATION Inhale into the lungs at bedtime. CPAP   Yes [provider]  SURE COMFORT PEN NEEDLES 31G X 5 MM [place]  09/12/19  Yes [provider]    Allergies    Melatonin  Review of Systems   Review of Systems  Eyes: Positive for pain.  Neurological: Positive for weakness.  All other systems reviewed and are negative.   Physical Exam Updated Vital Signs BP (!) 141/73   Pulse 85   Temp 98.8 F (37.1 C)   Resp 17   Ht 6\' 4"  (1.93 m)   Wt 114.3 kg   SpO2 96%   BMI 30.67 kg/m   Physical Exam Vitals and nursing note reviewed.  Constitutional:      Appearance: He is well-developed and well-nourished.  HENT:     Head: Normocephalic and atraumatic.  Cardiovascular:     Rate and Rhythm: Normal rate and regular rhythm.     Heart sounds: No  murmur heard.   Pulmonary:     Effort: Pulmonary effort is normal. No respiratory distress.     Breath sounds: Normal breath sounds.  Abdominal:     Palpations: Abdomen is soft.     Tenderness: There is no abdominal tenderness. There is no guarding or rebound.  Musculoskeletal:        General: Swelling present. No tenderness or edema.     Comments: Trace pitting edema to bilateral lower extremities  Skin:    General: Skin is warm and dry.  Neurological:     Mental Status: He is alert and oriented to person, place, and time.     Comments: 4+ out of five strength in all four extremities. Visual fields are grossly intact. No pronated or drift. There is a tremor to bilateral hands.  Psychiatric:        Mood and Affect: Mood and affect normal.        Behavior: Behavior normal.     ED Results / Procedures / Treatments   Labs (all labs ordered are listed, but only abnormal results are displayed) Labs Reviewed  BASIC METABOLIC PANEL - Abnormal; Notable for the following components:      Result Value   Glucose, Bld 155 (*)    BUN 27 (*)    Creatinine, Ser 1.53 (*)    GFR, Estimated 48 (*)    All other components within normal limits  CBC - Abnormal; Notable for the following components:   WBC 11.2 (*)    All other components within normal limits  URINALYSIS, ROUTINE W REFLEX MICROSCOPIC - Abnormal; Notable for the following components:   Glucose, UA 150 (*)    Protein, ur 100 (*)    Bacteria, UA RARE (*)    All other components within normal limits  CBG MONITORING, ED - Abnormal; Notable for the following components:   Glucose-Capillary 142 (*)    All other components within normal limits  URINE CULTURE  SARS CORONAVIRUS 2 (TAT 6-24 HRS)  HEPATIC FUNCTION PANEL  BRAIN NATRIURETIC PEPTIDE  D-DIMER, QUANTITATIVE  TROPONIN I (HIGH SENSITIVITY)  TROPONIN I (HIGH SENSITIVITY)    EKG EKG Interpretation  Date/Time:  Tuesday January 29 2021 19:04:57 EST Ventricular Rate:   90 PR Interval:    QRS Duration: 126 QT Interval:  367 QTC Calculation: 449 R Axis:   -66 Text Interpretation: Sinus rhythm Prolonged PR interval Consider left atrial enlargement Left bundle branch block Confirmed by Quintella Reichert [phone]) on 01/29/2021 8:48:02 PM   Radiology DG Chest 2 View  Result Date: 01/29/2021 CLINICAL DATA:  Hypoxia. EXAM: CHEST - 2 VIEW COMPARISON:  Chest x-ray dated January 27, 2021. FINDINGS: The heart size and mediastinal contours are within normal limits. Normal pulmonary vascularity. Low lung volumes with mild bibasilar atelectasis. No focal consolidation, pleural effusion, or pneumothorax. No acute osseous abnormality. Unchanged stimulator pack overlying the right chest. IMPRESSION: 1. Low lung volumes with mild bibasilar atelectasis. Electronically Signed   By: Titus Dubin M.D.   On: 01/29/2021 18:43   CT Head Wo Contrast  Result Date: 01/29/2021 CLINICAL DATA:  Head trauma, minor (Age >= 65y) multiple falls on eliquis, progressive generalized weakness with right sided headache since sunday EXAM: CT HEAD WITHOUT CONTRAST TECHNIQUE: Contiguous axial images were obtained from the base of the skull through the vertex without intravenous contrast. COMPARISON:  Head CT 2 days ago 01/27/2021 FINDINGS: Brain: Deep brain stimulator from a left frontal approach unchanged with tip in the deep basal ganglia. No intracranial hemorrhage, mass effect, or midline shift. No hydrocephalus. The basilar cisterns are patent. Stable degree of periventricular white matter hypodensity typical of chronic small vessel ischemia. Punctate right basal ganglia lacunar infarct. No evidence of territorial infarct or acute ischemia. No extra-axial or intracranial fluid collection. Vascular: Atherosclerosis of skullbase vasculature without hyperdense vessel or abnormal calcification. Skull: No fracture or focal lesion. Sinuses/Orbits: Bilateral cataract resection. Metallic density within the inferior  left orbit, unchanged. Punctate metallic density just lateral to the right orbit. Paranasal sinuses are clear. Mastoid air cells are well aerated. Other: None. IMPRESSION: 1. No acute intracranial abnormality. No skull fracture. 2. Stable chronic small vessel ischemia. Deep brain stimulator in place. Electronically Signed   By: Keith Rake M.D.   On: 01/29/2021 20:12   CT Cervical Spine Wo Contrast  Result Date: 01/29/2021 CLINICAL DATA:  Neck trauma (Age >= 65y) EXAM: CT CERVICAL SPINE WITHOUT CONTRAST TECHNIQUE: Multidetector CT imaging of the cervical spine was performed without intravenous contrast. Multiplanar CT image reconstructions were also generated. COMPARISON:  None. FINDINGS: Alignment: Normal. Skull base and vertebrae: No acute fracture. Dens and skull base are intact. No destructive process. Soft tissues and spinal canal: No prevertebral fluid or swelling. No visible canal hematoma. Disc levels: Anterior spurring at multiple levels. Mild disc space narrowing and posterior spurring at C5-C6. Scattered facet hypertrophy. Upper chest: No acute or unexpected findings. Other: Lead from deep brain stimulator courses in the right soft tissues. IMPRESSION: Mild degenerative change in the cervical spine without acute fracture or subluxation. Electronically Signed   By: Keith Rake M.D.   On: 01/29/2021 20:16    Procedures Procedures   Medications Ordered in ED Medications - No data to display  ED Course  I have reviewed the triage vital signs and the nursing notes.  Pertinent labs & imaging results that were available during my care of the patient were reviewed by me and considered in my medical decision making (see chart for details).    MDM Rules/Calculators/A&P  patient with history of hypertension, CKD, CVA, DVT on anticoagulation here for evaluation of progressive generalized weakness, dyspnea on exertion. He is non-toxic appearing on evaluation with  generalized weakness. He did have hypoxia at his PCPs office on ambulation. No hypoxia at rest in the emergency department. Labs with stable renal insufficiency. D dimer is negative. Given his symptoms plan to admit for ongoing workup for exertional dyspnea and hypoxia as well as generalized weakness. Hospitalist consulted for admission.  Final Clinical Impression(s) / ED Diagnoses Final diagnoses:  Generalized weakness  DOE (dyspnea on exertion)    Rx / DC Orders ED Discharge Orders    None       Quintella Reichert, MD 01/29/21 2356

## 2021-01-30 ENCOUNTER — Encounter (HOSPITAL_COMMUNITY): Payer: Self-pay | Admitting: Internal Medicine

## 2021-01-30 ENCOUNTER — Observation Stay (HOSPITAL_BASED_OUTPATIENT_CLINIC_OR_DEPARTMENT_OTHER): Payer: Medicare PPO

## 2021-01-30 DIAGNOSIS — R0609 Other forms of dyspnea: Secondary | ICD-10-CM | POA: Diagnosis not present

## 2021-01-30 DIAGNOSIS — I5031 Acute diastolic (congestive) heart failure: Secondary | ICD-10-CM | POA: Diagnosis not present

## 2021-01-30 DIAGNOSIS — I10 Essential (primary) hypertension: Secondary | ICD-10-CM | POA: Diagnosis not present

## 2021-01-30 DIAGNOSIS — E876 Hypokalemia: Secondary | ICD-10-CM | POA: Diagnosis not present

## 2021-01-30 DIAGNOSIS — I313 Pericardial effusion (noninflammatory): Secondary | ICD-10-CM

## 2021-01-30 DIAGNOSIS — R0600 Dyspnea, unspecified: Secondary | ICD-10-CM

## 2021-01-30 DIAGNOSIS — J9601 Acute respiratory failure with hypoxia: Secondary | ICD-10-CM | POA: Diagnosis not present

## 2021-01-30 LAB — BASIC METABOLIC PANEL
Anion gap: 9 (ref 5–15)
BUN: 23 mg/dL (ref 8–23)
CO2: 26 mmol/L (ref 22–32)
Calcium: 9.4 mg/dL (ref 8.9–10.3)
Chloride: 104 mmol/L (ref 98–111)
Creatinine, Ser: 1.4 mg/dL — ABNORMAL HIGH (ref 0.61–1.24)
GFR, Estimated: 54 mL/min — ABNORMAL LOW (ref >60)
Glucose, Bld: 94 mg/dL (ref 70–99)
Potassium: 3.4 mmol/L — ABNORMAL LOW (ref 3.5–5.1)
Sodium: 139 mmol/L (ref 135–145)

## 2021-01-30 LAB — CBC
HCT: 43.7 % (ref 39.0–52.0)
Hemoglobin: 14.7 g/dL (ref 13.0–17.0)
MCH: 29.6 pg (ref 26.0–34.0)
MCHC: 33.6 g/dL (ref 30.0–36.0)
MCV: 88.1 fL (ref 80.0–100.0)
Platelets: 249 10*3/uL (ref 150–400)
RBC: 4.96 MIL/uL (ref 4.22–5.81)
RDW: 12.9 % (ref 11.5–15.5)
WBC: 9.8 10*3/uL (ref 4.0–10.5)
nRBC: 0 % (ref 0.0–0.2)

## 2021-01-30 LAB — ECHOCARDIOGRAM COMPLETE
Height: 76 in
S' Lateral: 4.8 cm
Weight: 4032 oz

## 2021-01-30 LAB — CK: Total CK: 63 U/L (ref 49–397)

## 2021-01-30 LAB — GLUCOSE, CAPILLARY
Glucose-Capillary: 101 mg/dL — ABNORMAL HIGH (ref 70–99)
Glucose-Capillary: 116 mg/dL — ABNORMAL HIGH (ref 70–99)
Glucose-Capillary: 171 mg/dL — ABNORMAL HIGH (ref 70–99)

## 2021-01-30 LAB — HEMOGLOBIN A1C
Hgb A1c MFr Bld: 8.9 % — ABNORMAL HIGH (ref 4.8–5.6)
Mean Plasma Glucose: 208.73 mg/dL

## 2021-01-30 LAB — MAGNESIUM: Magnesium: 1.7 mg/dL (ref 1.7–2.4)

## 2021-01-30 LAB — C-REACTIVE PROTEIN: CRP: 0.5 mg/dL (ref <1.0)

## 2021-01-30 LAB — SARS CORONAVIRUS 2 (TAT 6-24 HRS): SARS Coronavirus 2: NEGATIVE

## 2021-01-30 LAB — CBG MONITORING, ED
Glucose-Capillary: 130 mg/dL — ABNORMAL HIGH (ref 70–99)
Glucose-Capillary: 99 mg/dL (ref 70–99)

## 2021-01-30 LAB — TROPONIN I (HIGH SENSITIVITY): Troponin I (High Sensitivity): 12 ng/L (ref <18)

## 2021-01-30 LAB — TSH: TSH: 1.421 u[IU]/mL (ref 0.350–4.500)

## 2021-01-30 LAB — SEDIMENTATION RATE: Sed Rate: 20 mm/hr — ABNORMAL HIGH (ref 0–16)

## 2021-01-30 MED ORDER — LOSARTAN POTASSIUM 50 MG PO TABS
100.0000 mg | ORAL_TABLET | Freq: Every day | ORAL | Status: DC
  Administered 2021-01-30 – 2021-02-02 (×4): 100 mg via ORAL
  Filled 2021-01-30 (×4): qty 2

## 2021-01-30 MED ORDER — INSULIN GLARGINE 100 UNIT/ML ~~LOC~~ SOLN
85.0000 [IU] | Freq: Two times a day (BID) | SUBCUTANEOUS | Status: DC
  Administered 2021-01-30 – 2021-02-02 (×5): 85 [IU] via SUBCUTANEOUS
  Filled 2021-01-30 (×10): qty 0.85

## 2021-01-30 MED ORDER — MIRTAZAPINE 15 MG PO TABS
7.5000 mg | ORAL_TABLET | Freq: Every day | ORAL | Status: DC
  Administered 2021-01-30 – 2021-02-01 (×4): 7.5 mg via ORAL
  Filled 2021-01-30 (×4): qty 1

## 2021-01-30 MED ORDER — APIXABAN 5 MG PO TABS
5.0000 mg | ORAL_TABLET | Freq: Two times a day (BID) | ORAL | Status: DC
  Administered 2021-01-30 (×2): 5 mg via ORAL
  Filled 2021-01-30 (×2): qty 1

## 2021-01-30 MED ORDER — COLCHICINE 0.6 MG PO TABS
0.6000 mg | ORAL_TABLET | Freq: Two times a day (BID) | ORAL | Status: DC
  Administered 2021-01-30 – 2021-02-02 (×6): 0.6 mg via ORAL
  Filled 2021-01-30 (×6): qty 1

## 2021-01-30 MED ORDER — ACETAMINOPHEN 650 MG RE SUPP: 650.0000 mg | Freq: Four times a day (QID) | RECTAL | Status: DC | PRN

## 2021-01-30 MED ORDER — ATORVASTATIN CALCIUM 40 MG PO TABS
40.0000 mg | ORAL_TABLET | Freq: Every day | ORAL | Status: DC
  Administered 2021-01-30 – 2021-02-01 (×3): 40 mg via ORAL
  Filled 2021-01-30 (×3): qty 1

## 2021-01-30 MED ORDER — FUROSEMIDE 40 MG PO TABS
40.0000 mg | ORAL_TABLET | Freq: Every day | ORAL | Status: DC
  Administered 2021-01-30 – 2021-02-02 (×4): 40 mg via ORAL
  Filled 2021-01-30: qty 1
  Filled 2021-01-30: qty 2
  Filled 2021-01-30 (×2): qty 1

## 2021-01-30 MED ORDER — INSULIN ASPART 100 UNIT/ML ~~LOC~~ SOLN: 0.0000 [IU] | Freq: Every day | SUBCUTANEOUS | Status: DC

## 2021-01-30 MED ORDER — ACETAMINOPHEN 325 MG PO TABS
650.0000 mg | ORAL_TABLET | Freq: Four times a day (QID) | ORAL | Status: DC | PRN
  Administered 2021-01-30 – 2021-01-31 (×3): 650 mg via ORAL
  Filled 2021-01-30 (×3): qty 2

## 2021-01-30 MED ORDER — INSULIN ASPART 100 UNIT/ML ~~LOC~~ SOLN
0.0000 [IU] | Freq: Three times a day (TID) | SUBCUTANEOUS | Status: DC
  Administered 2021-01-31 – 2021-02-01 (×3): 3 [IU] via SUBCUTANEOUS
  Administered 2021-02-01: 5 [IU] via SUBCUTANEOUS
  Administered 2021-02-02: 3 [IU] via SUBCUTANEOUS

## 2021-01-30 MED ORDER — MAGNESIUM SULFATE IN D5W 1-5 GM/100ML-% IV SOLN
1.0000 g | Freq: Once | INTRAVENOUS | Status: AC
  Administered 2021-01-30: 1 g via INTRAVENOUS
  Filled 2021-01-30: qty 100

## 2021-01-30 MED ORDER — AMLODIPINE BESYLATE 10 MG PO TABS
10.0000 mg | ORAL_TABLET | Freq: Every day | ORAL | Status: DC
  Administered 2021-01-30 – 2021-02-01 (×4): 10 mg via ORAL
  Filled 2021-01-30 (×2): qty 1
  Filled 2021-01-30: qty 2
  Filled 2021-01-30: qty 1

## 2021-01-30 MED ORDER — POTASSIUM CHLORIDE CRYS ER 20 MEQ PO TBCR
40.0000 meq | EXTENDED_RELEASE_TABLET | Freq: Once | ORAL | Status: AC
  Administered 2021-01-30: 40 meq via ORAL
  Filled 2021-01-30: qty 2

## 2021-01-30 MED ORDER — DULOXETINE HCL 60 MG PO CPEP
60.0000 mg | ORAL_CAPSULE | Freq: Every day | ORAL | Status: DC
  Administered 2021-01-30 – 2021-02-02 (×4): 60 mg via ORAL
  Filled 2021-01-30 (×4): qty 1

## 2021-01-30 MED ORDER — ATORVASTATIN CALCIUM 80 MG PO TABS
80.0000 mg | ORAL_TABLET | Freq: Every day | ORAL | Status: DC
Start: 2021-01-30 — End: 2021-01-30

## 2021-01-30 MED ORDER — FENOFIBRATE 54 MG PO TABS
54.0000 mg | ORAL_TABLET | Freq: Every day | ORAL | Status: DC
  Administered 2021-01-30: 54 mg via ORAL
  Filled 2021-01-30: qty 1

## 2021-01-30 MED ORDER — PERFLUTREN LIPID MICROSPHERE
1.0000 mL | INTRAVENOUS | Status: AC | PRN
  Administered 2021-01-30: 2 mL via INTRAVENOUS
  Filled 2021-01-30: qty 10

## 2021-01-30 NOTE — Progress Notes (Signed)
NEW ADMISSION NOTE New Admission Note:   Arrival Method: Wheelchair Mental Orientation: AAOx4 Telemetry: Box 15 Assessment: Completed Skin: Stage 1 on buttocks IV: LFA  Pain: 2/10 Tubes: n/a Safety Measures: Safety Fall Prevention Plan has been given, discussed and signed Admission: Completed [facility] Orientation: Patient has been orientated to the room, unit and staff.  Family: Wife at bedside  Orders have been reviewed and implemented. Will continue to monitor the patient. Call light has been placed within reach and bed alarm has been activated.   Vira Agar, RN

## 2021-01-30 NOTE — Progress Notes (Signed)
PT Cancellation Note  Patient Details Name: Joshua [name] MRN: [id number] DOB: 1951-03-26   Cancelled Treatment:    Reason Eval/Treat Not Completed: Other (comment).  Reported OT had already seen him and was not doing any more therapy today.  Follow up as time and pt allow.   Ramond Dial 01/30/2021, 3:35 PM [name], PT MS Acute Rehab Dept. Number: [place] and [place]

## 2021-01-30 NOTE — Evaluation (Signed)
Occupational Therapy Evaluation Patient Details Name: Joshua [name] MRN: [id number] DOB: 1951/10/19 Today's Date: 01/30/2021    History of Present Illness Joshua [name] is a 70 y.o. male with medical history significant of hypertrophic cardiomyopathy, hypertension, insulin-dependent type 2 diabetes, OSA on CPAP, CKD stage IIIa, PE after knee surgery, essential tremor status post deep brain stimulator 2951, cardioembolic CVA on Eliquis presenting with a chief complaint of generalized weakness and severe fatigue.   Clinical Impression   PTA, pt lives with spouse and reports typically independent with ADLs and mobility in the home. Pt endorses recent hx of falls at home due to chronic balance deficits from previous CVA, as well as vertigo. Pt with hx of decreased coordination due to CVA. Wife present and supportive during session. Pt overall min guard for short mobility in room without AD, no overt LOB but pt does endorse typical dizziness in standing. Pt requires Setup for UB ADLs and up to Min A for LB ADLs due to deficits. SpO2 WFL throughout session on RA. Began education in safety strategies and fall prevention at home with further reinforcement beneficial. Anticipate no OT services needed at discharge though pt will benefit from OT services at the acute level to return to independence and decrease fall risk at home.     Follow Up Recommendations  No OT follow up;Supervision - Intermittent    Equipment Recommendations  None recommended by OT    Recommendations for Other Services       Precautions / Restrictions Precautions Precautions: Fall;Other (comment) Precaution Comments: monitor O2, SOB, hx of vertigo Restrictions Weight Bearing Restrictions: No      Mobility Bed Mobility Overal bed mobility: Modified Independent                  Transfers Overall transfer level: Needs assistance Equipment used: None Transfers: Sit to/from Stand Sit to Stand: Min guard          General transfer comment: min guard with increased time needed for power up from bedside due to tall stature. Pt with recent hx of falls    Balance Overall balance assessment: Needs assistance;History of Falls Sitting-balance support: No upper extremity supported;Feet supported Sitting balance-Leahy Scale: Fair     Standing balance support: No upper extremity supported;During functional activity Standing balance-Leahy Scale: Fair                             ADL either performed or assessed with clinical judgement   ADL Overall ADL's : Needs assistance/impaired Eating/Feeding: Set up;Sitting Eating/Feeding Details (indicate cue type and reason): hx of tremors, difficulty using the regular plastic utensils sent to room. Per wife,pt uses large serving spoon for meals. Using B hands for task, L UE supporting R hand Grooming: Set up;Sitting   Upper Body Bathing: Set up;Sitting   Lower Body Bathing: Minimal assistance;Sit to/from stand   Upper Body Dressing : Set up;Sitting   Lower Body Dressing: Minimal assistance;Sit to/from stand Lower Body Dressing Details (indicate cue type and reason): Assistance needed to don socks sitting EOB. Pt may occasionally require assist for this from wife at home Toilet Transfer: Min guard;Ambulation   Toileting- Clothing Manipulation and Hygiene: Min guard;Sit to/from stand       Functional mobility during ADLs: Min guard General ADL Comments: Pt with hx of balance impairments, vertigo increasing fall risk. Pt on RA throughout with O2 WFL throughout session, 1/4 DOE     Vision  Patient Visual Report: No change from baseline Vision Assessment?: No apparent visual deficits     Perception     Praxis      Pertinent Vitals/Pain Pain Assessment: No/denies pain     Hand Dominance Right   Extremity/Trunk Assessment Upper Extremity Assessment Upper Extremity Assessment: RUE deficits/detail;LUE deficits/detail RUE Deficits /  Details: hx of tremors, hx of brain stimulator sx - recurrence of tremors noted per wife RUE Coordination: decreased fine motor LUE Deficits / Details: hx of tremors L > R due to failed brain stimulator surgery LUE Coordination: decreased fine motor   Lower Extremity Assessment Lower Extremity Assessment: Defer to PT evaluation   Cervical / Trunk Assessment Cervical / Trunk Assessment: Normal   Communication Communication Communication: No difficulties   Cognition Arousal/Alertness: Awake/alert Behavior During Therapy: WFL for tasks assessed/performed;Flat affect Overall Cognitive Status: Within Functional Limits for tasks assessed                                 General Comments: WFL for functional tasks, did not formally assess   General Comments  Wife present and supportive throughout. Pt O2 WFL throughout on RA, HR WFL. Pt endorses dizziness in standing secondary to vertigo. Pt reports having outpatient PT for vertigo, balance, etc but may not have helped as much as they would've liked    Exercises     Shoulder Instructions      Home Living Family/patient expects to be discharged to:: Private residence Living Arrangements: Spouse/significant other Available Help at Discharge: Family;Available 24 hours/day Type of Home: House Home Access: Stairs to enter [employer] of Steps: >4 small steps on to porch Entrance Stairs-Rails: Right;Left Home Layout: One level     Bathroom Shower/Tub: Occupational psychologist: Handicapped height Bathroom Accessibility: Yes   Home Equipment: Environmental consultant - 2 wheels;Cane - single point;Shower seat - built in;Hand held shower head;Grab bars - tub/shower   Additional Comments: Wife reports they made home handicapped accessible, wider doors, shorter steps inside, etc      Prior Functioning/Environment Level of Independence: Independent        Comments: Typically does not use AD for mobility, hx of 3 falls  in 2 weeks due to vertigo, balance deficits from previous stroke. Typically independent with ADLs, cooks at home        OT Problem List: Decreased activity tolerance;Decreased coordination;Cardiopulmonary status limiting activity;Impaired balance (sitting and/or standing)      OT Treatment/Interventions: Self-care/ADL training;Therapeutic exercise;DME and/or AE instruction;Therapeutic activities;Balance training;Patient/family education    OT Goals(Current goals can be found in the care plan section) Acute Rehab OT Goals Patient Stated Goal: identify reason for desats, dizziness, etc OT Goal Formulation: With patient/family Time For Goal Achievement: 02/13/21 Potential to Achieve Goals: Good ADL Goals Pt Will Perform Grooming: Independently;standing Pt Will Perform Lower Body Dressing: Independently;sitting/lateral leans;sit to/from stand Pt Will Transfer to Toilet: Independently;ambulating Additional ADL Goal #1: Pt to verbalize at least 2 fall preventions to maximize safety at home Additional ADL Goal #2: Pt to increase activity tolerance to > 7 minutes standing during functional tasks to improve overall endurance Additional ADL Goal #3: Pt to verbalize at least 2 energy conservation strategies to implement during ADLs/IADLs  OT Frequency: Min 2X/week   Barriers to D/C:            Co-evaluation              AM-PAC OT "  6 Clicks" Daily Activity     Outcome Measure Help from another person eating meals?: A Little Help from another person taking care of personal grooming?: A Little Help from another person toileting, which includes using toliet, bedpan, or urinal?: A Little Help from another person bathing (including washing, rinsing, drying)?: A Little Help from another person to put on and taking off regular upper body clothing?: A Little Help from another person to put on and taking off regular lower body clothing?: A Little 6 Click Score: 18   End of Session Nurse  Communication: Mobility status;Other (comment) (RN present setting up continuous pulse ox during session)  Activity Tolerance: Patient tolerated treatment well Patient left: in chair;with call bell/phone within reach;with family/visitor present  OT Visit Diagnosis: Unsteadiness on feet (R26.81);Other abnormalities of gait and mobility (R26.89);History of falling (Z91.81)                Time: 2552-5894 OT Time Calculation (min): 24 min Charges:  OT General Charges $OT Visit: 1 Visit OT Evaluation $OT Eval Low Complexity: 1 Low  Malachy Chamber, OTR/L Acute Rehab Services Office: [phone]  Layla Maw 01/30/2021, 12:32 PM

## 2021-01-30 NOTE — Consult Note (Addendum)
Cardiology Consultation:   Patient ID: Joshua [name] MRN: [id number]; DOB: [birth year]  Admit date: 01/29/2021 Date of Consult: 01/30/2021  PCP:  [name], [place]  Cardiologist:  Donato Heinz, MD  Advanced Practice Provider:  No care team member to display Electrophysiologist:  None    Patient Profile:   Joshua [name] is a 70 y.o. male with a hx of hypertrophic cardiomyopathy, HTN, DM, OSA, prior DVT/PE after recent knee surgery 2018, essential tremor s/p DBS, CVAs (one with subclavian artery thrombus), PVCs , brief NSVT/SVT, CKD stage III, who is being seen today for the evaluation of shortness of breath at the request of Dr. Marlowe Sax.  He presents with a multitude of symptoms lately including increasing right arm shaking, dizziness, frequent falls, profound fatigue, sleeping more, increased headaches and DOE.  History of Present Illness:   Joshua [name] appears to have had disjointed episodes of embolic-nature events. He had a remote large stroke about 12 years ago per his wife that left him with significant vertigo. He then had a retinal artery occlusion around 7 years ago. He had a DVT/PE in 2018 after knee surgery for which he was on several months of anticoagulation then discontinued. He then had a stroke in 08/2019 (while on ASA only).He had presented with slurred speech and CTA head and neck showed subclavian artery thrombus.MRI brain showed right internal capsule infarct and suspected MRI negative acute left cerebellar stroke (therefore multifocal and suspected embolic).Echocardiogram 08/2019 showed EF 55-60%, asymmetric basal septal hypertrophy, and mild dilation of aortic root but no cardiac source of embolus. He was started on heparin given subclavian artery thrombosis, and this was transitioned to Lovenox then eventually Eliquis. It was unclear if the thrombosis was due to a cardiac source or developed in situ. Stroke team recommended  Eliquis for 2 months and repeating CTA neck; if CTA neck negative and no atrial fibrillation noted on 30-day monitor, stroke team recommended stopping Eliquis and resuming antiplatelet agent. It does appar he's been continued on Eliquis since then.TTE was notable for an incidental finding of asymmetric basal septal hypertrophy (EF 55-60%, otherwise mild dilation of aortic root. He was referred to cardiology and established care with Dr. Gardiner Rhyme.  Cardiac MRI was ordered, which showed basal septal hypertrophy measuring up to 64m (lateral wall 140m, consistent with hypertrophic cardiomyopathy, patchy late gadolinium enhancement in basal septum (c/w HCM), and basal inferolateral midwall LGE, which would not be a typical pattern for HCM, as more commonly seen in setting of prior myocarditis, sarcoidosis, or consideration of Fabry's disease. Alpha-galactosidase activity was normal and PYP scan showed no evidence of amyloid.  Cardiac monitor 09/2019 showed no VT, occasional PVCs (1.3% of beats). Repeat monitor 07/2020 showed 1 4 beat run of NSVT, 6 runs of SVT longest lasting 16 beats, occasional PVCs (4%). He had no family hx of HCM and had only had episode of syncope in the past with stroke. He was on diuretic in the past but this was stopped due to lightheadedness in the context of HCM.   He was recently seen by Dr. ScGardiner Rhymen 3/3 for evaluation of SOB with concern this could represent anginal equivalent. Upcoming Lexiscan nuclear stress test and CT calcium score were scheduled. He was seen in the ED 01/27/21 with a myriad of complaints including confusion, difficulty thinking, difficulty walking, more SOB, chest discomfort, dizziness and elevated BP. Workup was felt benign. Troponins were negative and head CT nonacute. He was offered  admission vs discharge with close follow-up and elected the latter. He went to see his PCP yesterday and on evaluation O2 sat was 90% RA that dropped to 84% with ambulation. He  therefore presented back to the ED with multiple complaints that have been smoldering over the last few weeks that include generalized weakness, worsening unsteadiness with 3 falls (one occurred sitting->standing, the others while already upright), increasing headaches, fatigue (sleeping more per wife which was unusual), and continued complaints of DOE. His wife has been concerned because some of the issues he'd had in the past with his speech, vertigo and shakiness had improved previously only to return recently. He also had had episode of feeling like his heart was "noisy" on Sunday 3/6 with palpitations, difficult to further describe. He had not had any chest pain. Here his O2 sat has been normal and he has been mildly hypertensive at times. Labs show hsTroponin 12-12, BNP wnl, CRP wnl, Hgb  wnl, mild leukocytosis of 11.2, d-dimer negative, mild hypokalemia of 3.4, Cr in 1.4-1.5 range (c/w baseline), Covid swab negative. CT head nonacute, CT c-spine mild degenerative changes, CXR with low lung volumes with mild bibasilar atelectasis. 2D echo was done and is pending. He denies current cardiac complaint but continues to have headache.  2D echo just resulted showing normal LVEF, reported large pericardial effusion (increased echogenicity) but poor acoustic windows making further evaluation challenging. He has not had any chest pain, either pleuritic or exertional, and has no orthopnea. His blood pressure is elevated HR is 80s-90s which is consistent with his prior baseline.   Past Medical History:  Diagnosis Date  . Benign essential tremor   . Benign positional vertigo   . CVA (cerebral vascular accident) ([place])    x2 - L retina, 1 right parietal  . Degenerative arthritis   . Depression   . Diabetes mellitus   . Dyslipidemia   . GERD (gastroesophageal reflux disease)    hiatal hernia  . Gout   . H/O: vasectomy   . Hearing aid worn    b/l  . Hx of appendectomy   . Hx of tonsillectomy   .  Hypertension   . 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 Melanoma ([place])   . Obesity   . OSA on CPAP    setting = 5  . Tremor, essential 06/22/2017  . Wears glasses     Past Surgical History:  Procedure Laterality Date  . APPENDECTOMY    . arthroscopic knee surgery Bilateral   . CATARACT EXTRACTION Bilateral   . COLONOSCOPY    . MINOR PLACEMENT OF FIDUCIAL N/A 06/30/2019   Procedure: Fiducial placement;  Surgeon: Erline Levine, MD;  Location: [facility];  Service: Neurosurgery;  Laterality: N/A;  Fiducial placement  . NASAL SEPTUM SURGERY    . PULSE GENERATOR IMPLANT N/A 07/14/2019   Procedure: Left cranial Implanted Pulse Generator and lead extension placement to right chest ;  Surgeon: Erline Levine, MD;  Location: [facility];  Service: Neurosurgery;  Laterality: N/A;  . SUBTHALAMIC STIMULATOR INSERTION Left 07/07/2019   Procedure: LEFT DEEP BRAIN STIMULATOR PLACEMENT;  Surgeon: Erline Levine, MD;  Location: [facility];  Service: Neurosurgery;  Laterality: Left;  . TONSILLECTOMY    . TOTAL KNEE ARTHROPLASTY Left 03/30/2017   Procedure: LEFT TOTAL KNEE ARTHROPLASTY;  Surgeon: Paralee Cancel, MD;  Location: [facility];  Service: Orthopedics;  Laterality: Left;  . WISDOM TOOTH EXTRACTION       Home Medications:  Prior  to Admission medications   Medication Sig Start Date End Date Taking? Authorizing Provider  amLODipine (NORVASC) 10 MG tablet Take 1 tablet (10 mg total) by mouth at bedtime. 10/31/20  Yes Donato Heinz, MD  atorvastatin (LIPITOR) 80 MG tablet Take 1 tablet (80 mg total) by mouth daily at 6 PM. 08/31/19  Yes Danford, Suann Larry, MD  DULoxetine (CYMBALTA) 60 MG capsule Take 60 mg by mouth daily.   Yes [provider]  ELIQUIS 5 MG TABS tablet TAKE 1 TABLET BY MOUTH TWICE DAILY. Patient taking differently: Take 5 mg by mouth 2 (two) times daily. 12/05/20  Yes Donato Heinz, MD  fenofibrate 54 MG tablet Take 54 mg by mouth daily.   Yes [provider]   furosemide (LASIX) 40 MG tablet Take 40 mg by mouth daily. 01/22/21  Yes [provider]  Insulin Glargine, 1 Unit Dial, (TOUJEO SOLOSTAR) 300 UNIT/ML SOPN Inject 85 Units into the skin 2 (two) times daily.   Yes [provider]  insulin lispro (HUMALOG) 100 UNIT/ML KwikPen Inject 15-25 Units into the skin See admin instructions. Inject 15 units subcutaneously after breakfast and 25 units after lunch and supper   Yes [provider]  losartan (COZAAR) 100 MG tablet Take 100 mg by mouth daily.   Yes [provider]  Lutein-Zeaxanthin 25-5 MG CAPS Take 1 tablet by mouth daily.   Yes [provider]  metFORMIN (GLUCOPHAGE) 1000 MG tablet Take 1,000 mg by mouth 2 (two) times daily with a meal.   Yes [provider]  mirtazapine (REMERON) 7.5 MG tablet Take 7.5 mg by mouth at bedtime.   Yes [provider]  Multiple Vitamin (MULTIVITAMIN WITH MINERALS) TABS tablet Take 1 tablet by mouth daily.   Yes [provider]  Omega-3 Fatty Acids (FISH OIL) 1000 MG CAPS Take 2,000 mg by mouth daily. Reported on 02/13/2016   Yes [provider]  [facility] VERIO test strip  09/13/18  Yes [provider]  Polyethyl Glycol-Propyl Glycol (SYSTANE OP) Place 1-2 drops into both eyes 4 (four) times daily as needed (dry eyes).    Yes [provider]  PRESCRIPTION MEDICATION Inhale into the lungs at bedtime. CPAP   Yes [provider]  SURE COMFORT PEN NEEDLES 31G X 5 MM [facility]  09/12/19  Yes [provider]    Inpatient Medications: Scheduled Meds: . amLODipine  10 mg Oral QHS  . apixaban  5 mg Oral BID  . atorvastatin  80 mg Oral q1800  . DULoxetine  60 mg Oral Daily  . fenofibrate  54 mg Oral Daily  . furosemide  40 mg Oral Daily  . insulin aspart  0-15 Units Subcutaneous TID WC  . insulin aspart  0-5 Units Subcutaneous QHS  . insulin glargine  85 Units Subcutaneous BID  . losartan  100 mg Oral Daily   . mirtazapine  7.5 mg Oral QHS   Continuous Infusions:  PRN Meds: acetaminophen **OR** acetaminophen  Allergies:    Allergies  Allergen Reactions  . Melatonin Other (See Comments)    dizzy    Social History:   Social History   Socioeconomic History  . Marital status: Married    Spouse name: [name]  . Number of children: 2  . Years of education: Bachelors   . Highest education level: Bachelor's degree (e.g., BA, AB, BS)  Occupational History  . Occupation: retired    Fish farm manager: [employer]    Comment: teaching/coaching  Tobacco Use  . Smoking status: Former Smoker    Packs/day: 1.00    Years: 10.00    Pack years: 10.00    Types: Cigarettes    Quit date: 11/25/1983    Years since quitting: 37.2  . Smokeless tobacco: Never Used  Vaping Use  . Vaping Use: Never used  Substance and Sexual Activity  . Alcohol use: Yes    Alcohol/week: 2.0 standard drinks    Types: 2 Standard drinks or equivalent per week    Comment: occassionally  . Drug use: No  . Sexual activity: Not on file  Other Topics Concern  . Not on file  Social History Narrative   Patient lives at home with wife. [name](   Patient has 2 children that are in good health.    Patient works for [employer]. Retired .   Patient has a Bachelors degree in History.    Social Determinants of Health   Financial Resource Strain: Not on file  Food Insecurity: Not on file  Transportation Needs: Not on file  Physical Activity: Not on file  Stress: Not on file  Social Connections: Not on file  Intimate Partner Violence: Not on file    Family History:   Family History  Problem Relation Age of Onset  . Cerebral aneurysm Mother   . Tremor Mother   . Alzheimer's disease Father   . Tremor Brother   . Tremor Maternal Uncle   . Diabetes Maternal Grandmother   . Healthy Son   . Colon cancer Neg Hx      ROS:  Please see the history of present illness.  All other ROS reviewed and negative.      Physical Exam/Data:   Vitals:   01/30/21 0530 01/30/21 0745 01/30/21 0833 01/30/21 1100  BP: (!) 167/93 126/77  (!) 141/94  Pulse: (!) 105 (!) 108  92  Resp: (!) 21 18  20   Temp:   98.7 F (37.1 C) 97.7 F (36.5 C)  TempSrc:   Oral   SpO2: 96% 97%  99%  Weight:      Height:       No intake or output data in the 24 hours ending 01/30/21 1314 Last 3 Weights 01/29/2021 01/27/2021 01/24/2021  Weight (lbs) 252 lb 257 lb 12.8 oz 257 lb 12.8 oz  Weight (kg) 114.306 kg 116.937 kg 116.937 kg     Body mass index is 30.67 kg/m.  Vital Signs. BP (!) 141/94   Pulse 92   Temp 97.7 F (36.5 C)   Resp 20   Ht 6' 4"  (1.93 m)   Wt 114.3 kg   SpO2 99%   BMI 30.67 kg/m  General: Well developed fatigued appearing WM in no acute distress lying flat in bed (we woke him from sleep) Head: Normocephalic, atraumatic, sclera non-icteric, no xanthomas, nares are without discharge. Neck: Negative for carotid bruits. JVP not elevated. Lungs: Clear bilaterally to auscultation without wheezes, rales, or rhonchi. Breathing is unlabored. Heart: RRR S1 S2 without murmurs, rubs, or gallops.  Abdomen: Soft, non-tender, non-distended with normoactive bowel sounds. No rebound/guarding. Extremities: No clubbing or cyanosis. No significant edema (trace sockline). Distal pedal pulses are 2+ and equal bilaterally. Neuro: Alert and oriented X 3 but do not some slowed cadence to speech. Moves all extremities spontaneously. Psych:  Responds to questions appropriately with a normal affect.  EKG:  The EKG was personally reviewed and demonstrates:  NSR 90bpm, borderline LBBB pattern similar to prior, first degree AVB,  nonspecific STT changes similar to prior Telemetry:  Telemetry was personally reviewed and demonstrates:  NSR, occasional PVCs  Relevant CV Studies: 2D Echo 08/2019  1. Technically difficult study. Left ventricular ejection fraction  appears grossly normal, approximately 55-60%, though difficult   visualization even with contrast  2. There is asymmetric basal septal hypertrophy measuring 18 mm in basal  septum (12 mm posterior wall). Consider cardiac MRI to assess for  hypertrophic cardiomyopathy if clinically indicated  3. Definity contrast agent was given IV to delineate the left ventricular  endocardial borders.  4. Global right ventricle has normal systolic function.The right  ventricular size is normal. No increase in right ventricular wall  thickness.  5. There is mild dilatation of the aortic root measuring 39 mm.  6. The inferior vena cava is dilated in size with <50% respiratory  variability, suggesting right atrial pressure of 15 mmHg.   Cardiac MRI 09/2019 IMPRESSION: 1. Limited study, as only a few sequences were able to be completed due to limitations from presence of deep brain stimulator 2. Basal septal hypertrophy measuring up to 28m (lateral wall 12 mm), consistent with hypertrophic cardiomyopathy 3. Patchy late gadolinium enhancement in basal septum, consistent with HCM 4. Basal inferolateral midwall LGE, which would not be a typical pattern for HCM, as more commonly seen in setting of prior myocarditis or sarcoidosis. 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  Normal LV size with hyperdynamic systolic function (EF 637% 6.  Normal RV size and systolic function (EF 616%  PYP Scan 10/2019  The study is normal.  No evidence of TTR amyloidosis.  Event monitor 08/2020  One 4 beat run of NSVT  6 runs of SVT, longest lasting 16 beats  Occasional PVCs (3.9%)   8 days of data recorded on Zio monitor. Patient had a min HR of 62 bpm, max HR of 162 bpm, and avg HR of 86 bpm. Predominant underlying rhythm was Sinus Rhythm. No atrial fibrillation, high degree block, or pauses noted. One 4 beat run of NSVT.  6 runs of SVT, longest lasting 16 beats.  Isolated atrial ectopy was rare (<1%).  Isolated ventricular  ectopy was occasional (3.9%).  There were 0 triggered events.     Laboratory Data:  High Sensitivity Troponin:   Recent Labs  Lab 01/27/21 1517 01/27/21 1651 01/29/21 2021 01/29/21 2143  TROPONINIHS 12 12 12 12      Chemistry Recent Labs  Lab 01/27/21 1247 01/29/21 1817 01/30/21 0437  NA 139 137 139  K 4.1 3.8 3.4*  CL 102 101 104  CO2 25 26 26   GLUCOSE 382* 155* 94  BUN 27* 27* 23  CREATININE 1.54* 1.53* 1.40*  CALCIUM 10.1 9.6 9.4  GFRNONAA 48* 48* 54*  ANIONGAP 12 10 9     Recent Labs  Lab 01/27/21 1247 01/29/21 2021  PROT 7.8 7.1  ALBUMIN 4.3 3.6  AST 18 18  ALT 21 19  ALKPHOS 91 86  BILITOT 0.5 0.6   Hematology Recent Labs  Lab 01/27/21 1247 01/29/21 1817 01/30/21 0437  WBC 8.3 11.2* 9.8  RBC 5.11 4.86 4.96  HGB 15.3 14.4 14.7  HCT 45.8 44.3 43.7  MCV 89.6 91.2 88.1  MCH 29.9 29.6 29.6  MCHC 33.4 32.5 33.6  RDW 12.8 12.7 12.9  PLT 269 251 249   BNP Recent Labs  Lab 01/27/21 1247 01/29/21 2021  BNP 16.4 13.4    DDimer  Recent Labs  Lab 01/29/21 2021  DDIMER <  0.27     Radiology/Studies:  DG Chest 2 View  Result Date: 01/29/2021 CLINICAL DATA:  Hypoxia. EXAM: CHEST - 2 VIEW COMPARISON:  Chest x-ray dated January 27, 2021. FINDINGS: The heart size and mediastinal contours are within normal limits. Normal pulmonary vascularity. Low lung volumes with mild bibasilar atelectasis. No focal consolidation, pleural effusion, or pneumothorax. No acute osseous abnormality. Unchanged stimulator pack overlying the right chest. IMPRESSION: 1. Low lung volumes with mild bibasilar atelectasis. Electronically Signed   By: Titus Dubin M.D.   On: 01/29/2021 18:43   CT Head Wo Contrast  Result Date: 01/29/2021 CLINICAL DATA:  Head trauma, minor (Age >= 65y) multiple falls on eliquis, progressive generalized weakness with right sided headache since sunday EXAM: CT HEAD WITHOUT CONTRAST TECHNIQUE: Contiguous axial images were obtained from the base of the  skull through the vertex without intravenous contrast. COMPARISON:  Head CT 2 days ago 01/27/2021 FINDINGS: Brain: Deep brain stimulator from a left frontal approach unchanged with tip in the deep basal ganglia. No intracranial hemorrhage, mass effect, or midline shift. No hydrocephalus. The basilar cisterns are patent. Stable degree of periventricular white matter hypodensity typical of chronic small vessel ischemia. Punctate right basal ganglia lacunar infarct. No evidence of territorial infarct or acute ischemia. No extra-axial or intracranial fluid collection. Vascular: Atherosclerosis of skullbase vasculature without hyperdense vessel or abnormal calcification. Skull: No fracture or focal lesion. Sinuses/Orbits: Bilateral cataract resection. Metallic density within the inferior left orbit, unchanged. Punctate metallic density just lateral to the right orbit. Paranasal sinuses are clear. Mastoid air cells are well aerated. Other: None. IMPRESSION: 1. No acute intracranial abnormality. No skull fracture. 2. Stable chronic small vessel ischemia. Deep brain stimulator in place. Electronically Signed   By: Keith Rake M.D.   On: 01/29/2021 20:12   CT HEAD WO CONTRAST  Result Date: 01/27/2021 CLINICAL DATA:  Altered mental status today.  Hypertension. EXAM: CT HEAD WITHOUT CONTRAST TECHNIQUE: Contiguous axial images were obtained from the base of the skull through the vertex without intravenous contrast. COMPARISON:  Head CT scan 12/15/2019. FINDINGS: Brain: No evidence of acute infarction, hemorrhage, hydrocephalus, extra-axial collection or mass lesion/mass effect. Vascular: No hyperdense vessel or unexpected calcification. Skull: Intact.  No focal lesion. Sinuses/Orbits: Status post cataract surgery. Other: Small radiopaque foreign bodies in the left orbit and lateral to the right orbit again seen. IMPRESSION: No acute abnormality.  Stable compared to prior exam. Electronically Signed   By: Inge Rise M.D.   On: 01/27/2021 13:41   CT Cervical Spine Wo Contrast  Result Date: 01/29/2021 CLINICAL DATA:  Neck trauma (Age >= 65y) EXAM: CT CERVICAL SPINE WITHOUT CONTRAST TECHNIQUE: Multidetector CT imaging of the cervical spine was performed without intravenous contrast. Multiplanar CT image reconstructions were also generated. COMPARISON:  None. FINDINGS: Alignment: Normal. Skull base and vertebrae: No acute fracture. Dens and skull base are intact. No destructive process. Soft tissues and spinal canal: No prevertebral fluid or swelling. No visible canal hematoma. Disc levels: Anterior spurring at multiple levels. Mild disc space narrowing and posterior spurring at C5-C6. Scattered facet hypertrophy. Upper chest: No acute or unexpected findings. Other: Lead from deep brain stimulator courses in the right soft tissues. IMPRESSION: Mild degenerative change in the cervical spine without acute fracture or subluxation. Electronically Signed   By: Keith Rake M.D.   On: 01/29/2021 20:16   DG Chest Portable 1 View  Result Date: 01/27/2021 CLINICAL DATA:  Chronic shortness of breath for 2 years. EXAM: PORTABLE  CHEST 1 VIEW COMPARISON:  09/13/2008 FINDINGS: The cardiomediastinal silhouette is unremarkable. Stimulator pack overlying the RIGHT chest is noted. There is no evidence of focal airspace disease, pulmonary edema, suspicious pulmonary nodule/mass, pleural effusion, or pneumothorax. No acute bony abnormalities are identified. IMPRESSION: No active disease. Electronically Signed   By: Margarette Canada M.D.   On: 01/27/2021 15:30     Assessment and Plan:   1. Dyspnea on exertion - ?hypoxia at PCP's office - not re-demonstrated here, but do not see he has ambulated yet to re-evaluate - recently evaluated as outpatient for this, with Lexiscan nuclear stress test pending - 2D echo today just resulted showing pericardial effusion which may be the etiology of his dyspnea so will hold off ischemic  evaluation - troponins reassuring, d-dimer normal, BNP normal, CXR clear and volume looks OK on exam - he has concomitant complaints including gait unsteadiness, recent falling, right hand tremors, difficulty with slowed speech / word finding difficulty and generalized profound fatigue - these are not typical of any specific cardiac condition and would therefore continue to investigate from a medical standpoint and consider including neurology as well, especially given h/o multiple brain sugeries and DBS - 2D echo just resulted with findings below, see #2  2. Pericardial effusion - 2D echo today was technically challenging due to limited acoustic windows but reported to show large pericardial effusion, echogenicity within the effusion (?exudative), EF 55-60%, RV function not well visualized - etiology not clear - has not had any recent pleuritic symptoms to suggest pericarditis, CRP normal and ESR only marginally elevated. No other major symptoms to suggest infection - he has no clinical signs of tamponade including no JVD, orthopnea, hypotension and current HR is stable compared to prior - with constellation of other symptoms would be concerned for a unifying diagnosis - ?post-viral, malignancy, infection, rheumatologic  - per d/w Dr. Marlou Porch, will start colchicine and downtitrate statin / hold fenofibrate due to drug/drug interaction and hold off NSAIDs given CKD - will check baseline CK and monitor for signs of myopathy given colchicine/statin use - hold anticoagulation - see below further thoughts  3. Known HCM with prior evaluation as outlined above - LV borders not well seen on echo - home regimen includes losartan, amlodipine and Lasix. Consider potential transition to beta blocker or non-dihydropyridine calcium channel blockers at some point, but will defer to MD  4. H/o PVCs, PSVT, NSVT - had episode of palpitations on Sunday, not clear what this represents - follow on telemetry - TSH  wnl - replete potassium - can consider repeating event monitor in OP setting if telemetry unrevealing here  5. HTN - follow in context of the above  6. Hypokalemia - K 3.4, do not see repleted - give 21meq KCl and follow, add mag as well - further lyte management per primary team  7. H/o CVAs (also prior retinal occlusion and prior DVT/PE after surgery) - per tentative discussion with MD, given large pericardial effusion, hold anticoagulation for now - will place SCDs for DVT ppx in the meantime  8. CKD stage III - Cr appears similar to baseline   Risk Assessment/Risk Scores:     N/A  For questions or updates, please contact [place] Please consult [email or web address] for contact info under    Signed, Joshua Pitter, PA-C  01/30/2021 1:14 PM  Personally seen and examined. Agree with above.   70 year old with constellation of symptoms including shortness of breath.  Recent diagnosis of hypertrophic cardiomyopathy.  Repeat echocardiogram today shows a new pericardial effusion.  Appears to be 1.5 cm in maximum diameter.  He is hypertensive.  Laying flat and comfortable.  No significant JVD noted on exam.  Respiratory rate appears normal.  Personally reviewed echocardiogram.  Discussed with Dr. Claiborne Billings of interventional cardiology as well.  For now, we will hold his Eliquis.  He is on Eliquis currently for prior subclavian artery thrombosis from approximately 2 years ago.  He is also had strokes in the past.  No documented evidence of atrial fibrillation. -We will go ahead and start him on colchicine 0.6 mg twice a day.  Hold statin and fibrate. -There does not appear to be an excellent window to perform pericardiocentesis.  Given the fact that he is not demonstrating any tamponade-like physiology, we will monitor clinically.  Likely repeat echocardiogram in approximately 1 week. -Question is could this be contributing to his shortness of breath.  Perhaps however physiologically does  not appear to be influencing his myocardial performance. -His inflammatory markers are for the most part benign. -Replating potassium -We will consider in the future adding metoprolol for his hypertrophic cardiomyopathy regimen.  Continue to monitor clinically.  We will continue to follow.  Candee Furbish, MD'

## 2021-01-30 NOTE — Progress Notes (Signed)
Labs have finally resulted. Mg 1.7. Will give 1g mag sulfate with recheck in AM.

## 2021-01-30 NOTE — Progress Notes (Signed)
  Echocardiogram 2D Echocardiogram has been performed.  Joshua [name] 01/30/2021, 2:00 PM

## 2021-01-30 NOTE — Progress Notes (Signed)
PT Cancellation Note  Patient Details Name: Joshua [name] MRN: [id number] DOB: 09/20/1951   Cancelled Treatment:    Reason Eval/Treat Not Completed: Patient at procedure or test/unavailable.  Retry as time and pt allow.   Ramond Dial 01/30/2021, 10:53 AM [name], PT MS Acute Rehab Dept. Number: [place] [facility] [place]

## 2021-01-30 NOTE — Progress Notes (Addendum)
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  Patient has seen cardiology 5 days ago for progressive dyspnea on exertion which was felt to represent anginal equivalent. Patient was scheduled for outpatient stress test to evaluate for ischemia.  He went to see his primary care physician and was told to go to emergency department because of hypoxia,  his O2 saturation dropped to 86% with ambulation.  CT head negative for any intracranial abnormality, CT C-spine negative. Patient is admitted for acute hypoxic respiratory failure possibly secondary to hypertrophic cardiomyopathy.  Cardiology consulted.  Patient might need cardiac work-up during hospitalization.  Assessment & Plan:   Principal Problem:   Acute hypoxemic respiratory failure (HCC) Active Problems:   Essential hypertension   Hyperlipidemia   Chronic kidney disease   DOE (dyspnea on exertion)   Acute hypoxemic respiratory failure: > Improving Patient reports progressive shortness of breath on exertion. He has seen his cardiologist recently and was scheduled to have outpatient stress test. His sats dropped to 84% with ambulation at PCPs office.   At present, not hypoxemic and no signs of respiratory distress.   Covid test negative, PE less likely as he is not tachycardic.   He is on chronic anticoagulation with Eliquis and reports compliance.   His D-dimer is negative.  He has a history of hypertrophic cardiomyopathy.   He was recently seen by cardiology for progressive dyspnea on exertion and symptoms were felt to represent anginal  equivalent.   At present, he is not endorsing any chest pain.  Initial high-sensitivity troponin negative,  EKG without acute changes. Continuous pulse ox, supplemental oxygen as needed.  TTE shows large pericardial effusion but no signs of tamponade. Cardiology consulted,  awaiting recommendation. He is saturating 96% on room air.   Generalized weakness / Falls No fever or significant leukocytosis on labs.   Chest x-ray not suggestive of pneumonia.  UA without signs of infection.  Head CT negative for acute finding.  Suspect weakness is due to cardiac etiology. -Work-up as mentioned above.    TSH normal.  PT/OT eval, fall precautions.  Right eye pain: Patient states he had some mild dull pain behind his right eye for 2 days which has now resolved.   GCA less likely as he denies blurry vision or change in his vision.  Not endorsing any symptoms of jaw claudication.  Afebrile. Check ESR and CRP levels  Asymmetric lower extremity edema:  Patient noted to have mild pitting edema of the left lower extremity.  No erythema or warmth to touch.  DVT less likely as he is on Eliquis and reports compliance.  D-dimer negative.  He was previously on hydrochlorothiazide which was stopped by cardiology due to complaint of lightheadedness which was felt to be due to diuretic use in the setting of hypertrophic cardiomyopathy.  Currently on Lasix 40 mg daily. -Continue home Lasix  Hypertension:  -Continue amlodipine, Lasix, and losartan  Insulin-dependent type 2 diabetes:  A1c 7.8 in 10/2020 per note from PCPs office. Hold home Metformin.  Continue home long-acting insulin.   Order sliding scale insulin moderate ACHS.   OSA -Continue CPAP  at night  History of cardioembolic CVA -Continue Eliquis, statin  CKD stage IIIa: Creatinine 1.5, at baseline. -Continue to monitor  Hyperlipidemia -Continue Lipitor, fenofibrate   DVT prophylaxis: SCDs Code Status: Full code Family Communication:  No family at bedside Disposition Plan:   Status is: Observation  The patient remains OBS appropriate and will d/c before 2 midnights.  Dispo: The patient is from: Home               Anticipated d/c is to: Home              Patient currently is not medically stable to d/c.   Difficult to place patient No   Consultants:   Cardiology  Procedures:  Antimicrobials:  Anti-infectives (From admission, onward)   None       Subjective: Patient was seen and examined at bedside.  Overnight events noted.  Patient reports feeling much better.  He is lying comfortably on the bed,  denies any chest pain and shortness of breath.  Objective: Vitals:   01/30/21 0530 01/30/21 0745 01/30/21 0833 01/30/21 1100  BP: (!) 167/93 126/77  (!) 141/94  Pulse: (!) 105 (!) 108  92  Resp: (!) 21 18  20   Temp:   98.7 F (37.1 C) 97.7 F (36.5 C)  TempSrc:   Oral   SpO2: 96% 97%  99%  Weight:      Height:    6' 4"  (1.93 m)    Intake/Output Summary (Last 24 hours) at 01/30/2021 1515 Last data filed at 01/30/2021 1300 Gross per 24 hour  Intake 240 ml  Output --  Net 240 ml   Filed Weights   01/29/21 1900  Weight: 114.3 kg    Examination:  General exam: Appears calm and comfortable, not in any acute distress. Respiratory system: Clear to auscultation. Respiratory effort normal. Cardiovascular system: S1 & S2 heard, RRR. No JVD, murmurs, rubs, gallops or clicks. No pedal edema. Gastrointestinal system: Abdomen is nondistended, soft and nontender. No organomegaly or masses felt. Normal bowel sounds heard. Central nervous system: Alert and oriented. No focal neurological deficits. Extremities: Symmetric 5 x 5 power.  Left lower extremity edema. Skin: No rashes, lesions or ulcers Psychiatry: Judgement and insight appear normal. Mood & affect appropriate.     Data Reviewed: I have personally reviewed following labs and imaging studies  CBC: Recent Labs  Lab 01/27/21 1247 01/29/21 1817  01/30/21 0437  WBC 8.3 11.2* 9.8  NEUTROABS 4.7  --   --   HGB 15.3 14.4 14.7  HCT 45.8 44.3 43.7  MCV 89.6 91.2 88.1  PLT 269 251 543   Basic Metabolic Panel: Recent Labs  Lab 01/27/21 1247 01/29/21 1817 01/30/21 0437  NA 139 137 139  K 4.1 3.8 3.4*  CL 102 101 104  CO2 25 26 26   GLUCOSE 382* 155* 94  BUN 27* 27* 23  CREATININE 1.54* 1.53* 1.40*  CALCIUM 10.1 9.6 9.4   GFR: Estimated Creatinine Clearance: 66.9 mL/min (A) (by C-G formula based on SCr of 1.4 mg/dL (H)). Liver Function Tests: Recent Labs  Lab 01/27/21 1247 01/29/21 2021  AST 18 18  ALT 21 19  ALKPHOS 91 86  BILITOT 0.5 0.6  PROT 7.8 7.1  ALBUMIN 4.3 3.6   No results for input(s): LIPASE, AMYLASE in the last 168 hours. No results for input(s): AMMONIA in the last 168 hours. Coagulation Profile: Recent Labs  Lab 01/27/21 1247  INR 1.0   Cardiac Enzymes: No results for  input(s): CKTOTAL, CKMB, CKMBINDEX, TROPONINI in the last 168 hours. BNP (last 3 results) No results for input(s): PROBNP in the last 8760 hours. HbA1C: Recent Labs    01/30/21 0437  HGBA1C 8.9*   CBG: Recent Labs  Lab 01/27/21 1523 01/29/21 1856 01/30/21 0106 01/30/21 0747 01/30/21 1208  GLUCAP 314* 142* 130* 99 101*   Lipid Profile: No results for input(s): CHOL, HDL, LDLCALC, TRIG, CHOLHDL, LDLDIRECT in the last 72 hours. Thyroid Function Tests: Recent Labs    01/30/21 0437  TSH 1.421   Anemia Panel: No results for input(s): VITAMINB12, FOLATE, FERRITIN, TIBC, IRON, RETICCTPCT in the last 72 hours. Sepsis Labs: No results for input(s): PROCALCITON, LATICACIDVEN in the last 168 hours.  Recent Results (from the past 240 hour(s))  SARS Coronavirus 2 by RT PCR (hospital order, performed in [place] hospital lab)     Status: None   Collection Time: 01/27/21  3:25 PM  Result Value Ref Range Status   SARS Coronavirus 2 NEGATIVE NEGATIVE Final    Comment: (NOTE) SARS-CoV-2 target nucleic acids are NOT  DETECTED.  The SARS-CoV-2 RNA is generally detectable in upper and lower respiratory specimens during the acute phase of infection. The lowest concentration of SARS-CoV-2 viral copies this assay can detect is 250 copies / mL. A negative result does not preclude SARS-CoV-2 infection and should not be used as the sole basis for treatment or other patient management decisions.  A negative result may occur with improper specimen collection / handling, submission of specimen other than nasopharyngeal swab, presence of viral mutation(s) within the areas targeted by this assay, and inadequate number of viral copies (<250 copies / mL). A negative result must be combined with clinical observations, patient history, and epidemiological information.  Fact Sheet for Patients:   StrictlyIdeas.no  Fact Sheet for Healthcare Providers: BankingDealers.co.za  This test is not yet approved or  cleared by the [place] FDA and has been authorized for detection and/or diagnosis of SARS-CoV-2 by FDA under an Emergency Use Authorization (EUA).  This EUA will remain in effect (meaning this test can be used) for the duration of the COVID-19 declaration under Section 564(b)(1) of the Act, 21 U.S.C. section 360bbb-3(b)(1), unless the authorization is terminated or revoked sooner.  Performed at [facility], [address] [place], [place] [address]   SARS CORONAVIRUS 2 (TAT 6-24 HRS) Nasopharyngeal Nasopharyngeal Swab     Status: None   Collection Time: 01/29/21  8:21 PM   Specimen: Nasopharyngeal Swab  Result Value Ref Range Status   SARS Coronavirus 2 NEGATIVE NEGATIVE Final    Comment: (NOTE) SARS-CoV-2 target nucleic acids are NOT DETECTED.  The SARS-CoV-2 RNA is generally detectable in upper and lower respiratory specimens during the acute phase of infection. Negative results do not preclude SARS-CoV-2 infection, do not rule  out co-infections with other pathogens, and should not be used as the sole basis for treatment or other patient management decisions. Negative results must be combined with clinical observations, patient history, and epidemiological information. The expected result is Negative.  Fact Sheet for Patients: SugarRoll.be  Fact Sheet for Healthcare Providers: https://www.woods-mathews.com/  This test is not yet approved or cleared by the [place] FDA and  has been authorized for detection and/or diagnosis of SARS-CoV-2 by FDA under an Emergency Use Authorization (EUA). This EUA will remain  in effect (meaning this test can be used) for the duration of the COVID-19 declaration under Se ction 564(b)(1) of the Act, 21  U.S.C. section 360bbb-3(b)(1), unless the authorization is terminated or revoked sooner.  Performed at [facility], [place] [address], [place], [place] [address]          Radiology Studies: DG Chest 2 View  Result Date: 01/29/2021 CLINICAL DATA:  Hypoxia. EXAM: CHEST - 2 VIEW COMPARISON:  Chest x-ray dated January 27, 2021. FINDINGS: The heart size and mediastinal contours are within normal limits. Normal pulmonary vascularity. Low lung volumes with mild bibasilar atelectasis. No focal consolidation, pleural effusion, or pneumothorax. No acute osseous abnormality. Unchanged stimulator pack overlying the right chest. IMPRESSION: 1. Low lung volumes with mild bibasilar atelectasis. Electronically Signed   By: Titus Dubin M.D.   On: 01/29/2021 18:43   CT Head Wo Contrast  Result Date: 01/29/2021 CLINICAL DATA:  Head trauma, minor (Age >= 65y) multiple falls on eliquis, progressive generalized weakness with right sided headache since sunday EXAM: CT HEAD WITHOUT CONTRAST TECHNIQUE: Contiguous axial images were obtained from the base of the skull through the vertex without intravenous contrast. COMPARISON:  Head CT 2 days ago  01/27/2021 FINDINGS: Brain: Deep brain stimulator from a left frontal approach unchanged with tip in the deep basal ganglia. No intracranial hemorrhage, mass effect, or midline shift. No hydrocephalus. The basilar cisterns are patent. Stable degree of periventricular white matter hypodensity typical of chronic small vessel ischemia. Punctate right basal ganglia lacunar infarct. No evidence of territorial infarct or acute ischemia. No extra-axial or intracranial fluid collection. Vascular: Atherosclerosis of skullbase vasculature without hyperdense vessel or abnormal calcification. Skull: No fracture or focal lesion. Sinuses/Orbits: Bilateral cataract resection. Metallic density within the inferior left orbit, unchanged. Punctate metallic density just lateral to the right orbit. Paranasal sinuses are clear. Mastoid air cells are well aerated. Other: None. IMPRESSION: 1. No acute intracranial abnormality. No skull fracture. 2. Stable chronic small vessel ischemia. Deep brain stimulator in place. Electronically Signed   By: Melanie  Sanford M.D.   On: 01/29/2021 20:12   CT Cervical Spine Wo Contrast  Result Date: 01/29/2021 CLINICAL DATA:  Neck trauma (Age >= 65y) EXAM: CT CERVICAL SPINE WITHOUT CONTRAST TECHNIQUE: Multidetector CT imaging of the cervical spine was performed without intravenous contrast. Multiplanar CT image reconstructions were also generated. COMPARISON:  None. FINDINGS: Alignment: Normal. Skull base and vertebrae: No acute fracture. Dens and skull base are intact. No destructive process. Soft tissues and spinal canal: No prevertebral fluid or swelling. No visible canal hematoma. Disc levels: Anterior spurring at multiple levels. Mild disc space narrowing and posterior spurring at C5-C6. Scattered facet hypertrophy. Upper chest: No acute or unexpected findings. Other: Lead from deep brain stimulator courses in the right soft tissues. IMPRESSION: Mild degenerative change in the cervical spine  without acute fracture or subluxation. Electronically Signed   By: Melanie  Sanford M.D.   On: 01/29/2021 20:16   ECHOCARDIOGRAM COMPLETE  Result Date: 01/30/2021    ECHOCARDIOGRAM REPORT   [name] Date of Exam: 01/30/2021 Medical Rec #:  [id number]       Height:       76.0 in Accession #:    [id number]      Weight:       252.0 lb Date of Birth:  12/19/1950       BSA:          2.444 m Patient Age:    69 years        BP:           14 1/94 mmHg  Patient Gender: M               HR:           86 bpm. Exam Location:  Inpatient Procedure: 2D Echo and Intracardiac Opacification Agent Indications:    CHF-Acute Diastolic E31.54  History:        Patient has prior history of Echocardiogram examinations, most                 recent 08/29/2019. Risk Factors:Hypertension and Diabetes.  Sonographer:    Mikki Santee RDCS (AE) Referring Phys: [id number] [facility]  Sonographer Comments: Technically difficult study due to poor echo windows, Technically challenging study due to limited acoustic windows, suboptimal subcostal window, suboptimal apical window and suboptimal parasternal window. IMPRESSIONS  1. Large pericardial effusion. The pericardial effusion is anterior to the right ventricle. There is no evidence of increased pericardial pressure. Echodensity within the effusion may suggest exudative nature.  2. Left ventricular ejection fraction, by estimation, is 55 to 60%. The left ventricle has normal function. Left ventricular endocardial border not optimally defined to evaluate regional wall motion. There is moderate concentric left ventricular hypertrophy. Left ventricular diastolic parameters are indeterminate.  3. Right ventricular systolic function was not well visualized. The right ventricular size is not well visualized.  4. The mitral valve was not well visualized. No evidence of mitral valve regurgitation.  5. The aortic valve was not well visualized. Aortic valve regurgitation is not  visualized. No aortic stenosis is present.  6. Aortic dilatation noted. There is mild dilatation of the aortic root, measuring 40 mm.  7. The inferior vena cava is normal in size with <50% respiratory variability, suggesting right atrial pressure of 8 mmHg. Comparison(s): A prior study was performed on 08/29/2019. Prior images reviewed side by side. Difficult comparsion- this study is much more technically difficult. Significant increase in pericardial effusion. Primary cardiology team made aware. FINDINGS  Left Ventricle: Left ventricular ejection fraction, by estimation, is 55 to 60%. The left ventricle has normal function. Left ventricular endocardial border not optimally defined to evaluate regional wall motion. Definity contrast agent was given IV to delineate the left ventricular endocardial borders. The left ventricular internal cavity size was normal in size. There is moderate concentric left ventricular hypertrophy. Left ventricular diastolic parameters are indeterminate. Right Ventricle: The right ventricular size is not well visualized. Right vetricular wall thickness was not well visualized. Right ventricular systolic function was not well visualized. Left Atrium: Left atrial size was normal in size. Right Atrium: Right atrial size was normal in size. Pericardium: A large pericardial effusion is present. The pericardial effusion is anterior to the right ventricle. Mitral Valve: The mitral valve was not well visualized. No evidence of mitral valve regurgitation. Tricuspid Valve: The tricuspid valve is not well visualized. Tricuspid valve regurgitation is not demonstrated. No evidence of tricuspid stenosis. Aortic Valve: The aortic valve was not well visualized. Aortic valve regurgitation is not visualized. No aortic stenosis is present. Pulmonic Valve: The pulmonic valve was not well visualized. Pulmonic valve regurgitation is not visualized. No evidence of pulmonic stenosis. Aorta: Aortic dilatation  noted. There is mild dilatation of the aortic root, measuring 40 mm. Venous: The inferior vena cava is normal in size with less than 50% respiratory variability, suggesting right atrial pressure of 8 mmHg. IAS/Shunts: The atrial septum is grossly normal.  LEFT VENTRICLE PLAX 2D LVIDd:         5.90 cm LVIDs:  4.80 cm LV PW:         1.30 cm LV IVS:        1.40 cm LVOT diam:     2.80 cm LV SV:         70 LV SV Index:   28 LVOT Area:     6.16 cm  RIGHT VENTRICLE RV S prime:     17.90 cm/s TAPSE (M-mode): 2.5 cm LEFT ATRIUM           Index      RIGHT ATRIUM           Index LA diam:      5.00 cm 2.05 cm/m RA Area:     18.30 cm LA Vol (A4C): 20.4 ml 8.35 ml/m RA Volume:   50.50 ml  20.66 ml/m  AORTIC VALVE LVOT Vmax:   74.10 cm/s LVOT Vmean:  47.600 cm/s LVOT VTI:    0.113 m  AORTA Ao Root diam: 3.60 cm  SHUNTS Systemic VTI:  0.11 m Systemic Diam: 2.80 cm Rudean Haskell MD Electronically signed by Rudean Haskell MD Signature Date/Time: 01/30/2021/3:00:35 PM    Final     Scheduled Meds: . amLODipine  10 mg Oral QHS  . atorvastatin  80 mg Oral q1800  . DULoxetine  60 mg Oral Daily  . fenofibrate  54 mg Oral Daily  . furosemide  40 mg Oral Daily  . insulin aspart  0-15 Units Subcutaneous TID WC  . insulin aspart  0-5 Units Subcutaneous QHS  . insulin glargine  85 Units Subcutaneous BID  . losartan  100 mg Oral Daily  . mirtazapine  7.5 mg Oral QHS  . potassium chloride  40 mEq Oral Once   Continuous Infusions:   LOS: 0 days    Time spent: 35 mins    Gaile Allmon, MD [facility]   If 7PM-7AM, please contact night-coverage

## 2021-01-31 DIAGNOSIS — I10 Essential (primary) hypertension: Secondary | ICD-10-CM | POA: Diagnosis not present

## 2021-01-31 DIAGNOSIS — R0609 Other forms of dyspnea: Secondary | ICD-10-CM | POA: Diagnosis not present

## 2021-01-31 DIAGNOSIS — J9601 Acute respiratory failure with hypoxia: Secondary | ICD-10-CM | POA: Diagnosis not present

## 2021-01-31 DIAGNOSIS — E876 Hypokalemia: Secondary | ICD-10-CM | POA: Diagnosis not present

## 2021-01-31 DIAGNOSIS — I313 Pericardial effusion (noninflammatory): Secondary | ICD-10-CM | POA: Diagnosis not present

## 2021-01-31 LAB — GLUCOSE, CAPILLARY
Glucose-Capillary: 62 mg/dL — ABNORMAL LOW (ref 70–99)
Glucose-Capillary: 112 mg/dL — ABNORMAL HIGH (ref 70–99)
Glucose-Capillary: 164 mg/dL — ABNORMAL HIGH (ref 70–99)
Glucose-Capillary: 183 mg/dL — ABNORMAL HIGH (ref 70–99)
Glucose-Capillary: 149 mg/dL — ABNORMAL HIGH (ref 70–99)

## 2021-01-31 LAB — BASIC METABOLIC PANEL
Anion gap: 7 (ref 5–15)
BUN: 26 mg/dL — ABNORMAL HIGH (ref 8–23)
CO2: 25 mmol/L (ref 22–32)
Calcium: 8.7 mg/dL — ABNORMAL LOW (ref 8.9–10.3)
Chloride: 107 mmol/L (ref 98–111)
Creatinine, Ser: 1.62 mg/dL — ABNORMAL HIGH (ref 0.61–1.24)
GFR, Estimated: 45 mL/min — ABNORMAL LOW (ref >60)
Glucose, Bld: 119 mg/dL — ABNORMAL HIGH (ref 70–99)
Potassium: 3.7 mmol/L (ref 3.5–5.1)
Sodium: 139 mmol/L (ref 135–145)

## 2021-01-31 LAB — CBC
HCT: 38.4 % — ABNORMAL LOW (ref 39.0–52.0)
Hemoglobin: 13.3 g/dL (ref 13.0–17.0)
MCH: 30.2 pg (ref 26.0–34.0)
MCHC: 34.6 g/dL (ref 30.0–36.0)
MCV: 87.3 fL (ref 80.0–100.0)
Platelets: 232 10*3/uL (ref 150–400)
RBC: 4.4 MIL/uL (ref 4.22–5.81)
RDW: 13.1 % (ref 11.5–15.5)
WBC: 8.3 10*3/uL (ref 4.0–10.5)
nRBC: 0 % (ref 0.0–0.2)

## 2021-01-31 LAB — MAGNESIUM: Magnesium: 1.9 mg/dL (ref 1.7–2.4)

## 2021-01-31 LAB — PHOSPHORUS: Phosphorus: 3.6 mg/dL (ref 2.5–4.6)

## 2021-01-31 MED ORDER — METOPROLOL TARTRATE 25 MG PO TABS
25.0000 mg | ORAL_TABLET | Freq: Two times a day (BID) | ORAL | Status: DC
  Administered 2021-01-31 – 2021-02-02 (×5): 25 mg via ORAL
  Filled 2021-01-31 (×5): qty 1

## 2021-01-31 NOTE — Progress Notes (Signed)
PROGRESS NOTE    Joshua [name]  KGU:[id number] DOB: 15-Feb-1951 DOA: 01/29/2021 PCP: [name], MD    Brief Narrative:  This 70 years old male with PMH significant for hypertrophic cardiomyopathy, hypertension, type 2 diabetes, OSA on CPAP, CKD stage IIIa, PE after knee surgery, essential tremor, s/p deep brain stimulator in 6283, cardioembolic CVA on Eliquis presenting with a chief complaint of generalized weakness and severe fatigue.  Patient has seen cardiology 5 days ago for progressive dyspnea on exertion which was felt to represent anginal equivalent. Patient was scheduled for outpatient stress test to evaluate for ischemia.  He went to see his primary care physician and was told to go to emergency department because of hypoxia,  his O2 saturation dropped to 86% with ambulation.  CT head negative for any intracranial abnormality, CT C-spine negative. Patient is admitted for acute hypoxic respiratory failure possibly secondary to hypertrophic cardiomyopathy.  Cardiology consulted.  Patient might need cardiac work-up during hospitalization.  Assessment & Plan:   Principal Problem:   Acute hypoxemic respiratory failure (HCC) Active Problems:   Essential hypertension   Hyperlipidemia   Chronic kidney disease   DOE (dyspnea on exertion)   Acute hypoxemic respiratory failure: > Improved. Patient reports progressive shortness of breath on exertion. He has seen his cardiologist recently and was scheduled to have outpatient stress test. His sats dropped to 84% with ambulation at PCPs office.   At present, not hypoxemic and no signs of respiratory distress.   Covid test negative, PE less likely as he is not tachycardic.   He is on chronic anticoagulation with Eliquis and reports compliance.   His D-dimer is negative.  He has a history of hypertrophic cardiomyopathy.   He was recently seen by cardiology for progressive dyspnea on exertion and symptoms were felt to represent anginal  equivalent.   At present, he is not endorsing any chest pain.  Initial high-sensitivity troponin negative,  EKG without acute changes. Continuous pulse ox, supplemental oxygen as needed.  TTE shows large pericardial effusion but no signs of tamponade. Cardiology consulted, started on colchicine.  Eliquis is on hold. He is saturating 96% on room air.  Large pericardial effusion: Patient remains asymptomatic, lying flat on the bed. Echocardiogram showed large pericardial effusion.  No signs of cardiac tamponade. Started on colchicine, Eliquis is on hold. Interventional radiologist and cardiologist discussed. It seems like there isn't adequate window for tapping , nor there is any impending need for fluid removal.  Continue with conservative management.  Generalized weakness / Falls No fever or significant leukocytosis on labs.   Chest x-ray not suggestive of pneumonia.  UA without signs of infection.  Head CT negative for acute finding.  Suspect weakness is due to cardiac etiology. -Work-up as mentioned above.    TSH normal.  PT/OT eval, fall precautions.  Right eye pain: Patient states he had some mild dull pain behind his right eye for 2 days which has now resolved.   GCA less likely as he denies blurry vision or change in his vision.  Not endorsing any symptoms of jaw claudication.  Afebrile. ESR and CRP slightly elevated.  Asymmetric lower extremity edema:  Patient noted to have mild pitting edema of the left lower extremity.  No erythema or warmth to touch.  DVT less likely as he is on Eliquis and reports compliance.  D-dimer negative.  He was previously on hydrochlorothiazide which was stopped by cardiology due to complaint of lightheadedness which was felt to be due  to diuretic use in the setting of hypertrophic cardiomyopathy.  Currently on Lasix 40 mg daily. Continue home Lasix  Hypertension:  -Continue amlodipine, Lasix, and losartan  Insulin-dependent type 2 diabetes:   A1c 7.8 in 10/2020 per note from PCPs office. Hold home Metformin.  Continue home long-acting insulin.   Continue sliding scale insulin moderate ACHS.   OSA Continue CPAP at night  History of cardioembolic CVA Hold Eliquis, Continue statin  CKD stage IIIa: Creatinine 1.5, at baseline. Continue to monitor  Hyperlipidemia Continue Lipitor, fenofibrate   DVT prophylaxis: SCDs Code Status: Full code Family Communication: No family at bedside Disposition Plan:   Status is: Observation  The patient will require care spanning > 2 midnights and should be moved to inpatient because: Inpatient level of care appropriate due to severity of illness  Dispo: The patient is from: Home              Anticipated d/c is to: Home              Patient currently is not medically stable to d/c.   Difficult to place patient No   Consultants:   Cardiology  Procedures:  Antimicrobials:  Anti-infectives (From admission, onward)   None       Subjective: Patient was seen and examined at bedside.  Overnight events noted.  Patient reports having headache behind right eye, He is lying comfortably on the bed,  denies any chest pain and shortness of breath.  Objective: Vitals:   01/30/21 1100 01/30/21 2044 01/31/21 0433 01/31/21 1021  BP: (!) 141/94 140/87 138/83 (!) 149/82  Pulse: 92 84 83 81  Resp: 20 20 16 16   Temp: 97.7 F (36.5 C) 98.3 F (36.8 C) 98.5 F (36.9 C) 97.8 F (36.6 C)  TempSrc:      SpO2: 99% 98% 94% 98%  Weight:      Height: 6' 4"  (1.93 m)       Intake/Output Summary (Last 24 hours) at 01/31/2021 1436 Last data filed at 01/31/2021 1300 Gross per 24 hour  Intake 580 ml  Output 550 ml  Net 30 ml   Filed Weights   01/29/21 1900  Weight: 114.3 kg    Examination:  General exam: Appears calm and comfortable, not in any acute distress. Respiratory system: Clear to auscultation. Respiratory effort normal. Cardiovascular system: S1 & S2 heard, RRR. No JVD,  murmurs, rubs, gallops or clicks. No pedal edema. Gastrointestinal system: Abdomen is nondistended, soft and nontender. No organomegaly or masses felt. Normal bowel sounds heard. Central nervous system: Alert and oriented. No focal neurological deficits. Extremities: Symmetric 5 x 5 power.  Left lower extremity edema. Skin: No rashes, lesions or ulcers Psychiatry: Judgement and insight appear normal. Mood & affect appropriate.     Data Reviewed: I have personally reviewed following labs and imaging studies  CBC: Recent Labs  Lab 01/27/21 1247 01/29/21 1817 01/30/21 0437 01/31/21 0305  WBC 8.3 11.2* 9.8 8.3  NEUTROABS 4.7  --   --   --   HGB 15.3 14.4 14.7 13.3  HCT 45.8 44.3 43.7 38.4*  MCV 89.6 91.2 88.1 87.3  PLT 269 251 249 275   Basic Metabolic Panel: Recent Labs  Lab 01/27/21 1247 01/29/21 1817 01/30/21 0437 01/30/21 1834 01/31/21 0305  NA 139 137 139  --  139  K 4.1 3.8 3.4*  --  3.7  CL 102 101 104  --  107  CO2 25 26 26   --  25  GLUCOSE  382* 155* 94  --  119*  BUN 27* 27* 23  --  26*  CREATININE 1.54* 1.53* 1.40*  --  1.62*  CALCIUM 10.1 9.6 9.4  --  8.7*  MG  --   --   --  1.7 1.9  PHOS  --   --   --   --  3.6   GFR: Estimated Creatinine Clearance: 57.9 mL/min (A) (by C-G formula based on SCr of 1.62 mg/dL (H)). Liver Function Tests: Recent Labs  Lab 01/27/21 1247 01/29/21 2021  AST 18 18  ALT 21 19  ALKPHOS 91 86  BILITOT 0.5 0.6  PROT 7.8 7.1  ALBUMIN 4.3 3.6   No results for input(s): LIPASE, AMYLASE in the last 168 hours. No results for input(s): AMMONIA in the last 168 hours. Coagulation Profile: Recent Labs  Lab 01/27/21 1247  INR 1.0   Cardiac Enzymes: Recent Labs  Lab 01/30/21 1834  CKTOTAL 63   BNP (last 3 results) No results for input(s): PROBNP in the last 8760 hours. HbA1C: Recent Labs    01/30/21 0437  HGBA1C 8.9*   CBG: Recent Labs  Lab 01/30/21 1654 01/30/21 2130 01/31/21 0703 01/31/21 0744 01/31/21 1130   GLUCAP 116* 171* 62* 112* 164*   Lipid Profile: No results for input(s): CHOL, HDL, LDLCALC, TRIG, CHOLHDL, LDLDIRECT in the last 72 hours. Thyroid Function Tests: Recent Labs    01/30/21 0437  TSH 1.421   Anemia Panel: No results for input(s): VITAMINB12, FOLATE, FERRITIN, TIBC, IRON, RETICCTPCT in the last 72 hours. Sepsis Labs: No results for input(s): PROCALCITON, LATICACIDVEN in the last 168 hours.  Recent Results (from the past 240 hour(s))  SARS Coronavirus 2 by RT PCR (hospital order, performed in [place] hospital lab)     Status: None   Collection Time: 01/27/21  3:25 PM  Result Value Ref Range Status   SARS Coronavirus 2 NEGATIVE NEGATIVE Final    Comment: (NOTE) SARS-CoV-2 target nucleic acids are NOT DETECTED.  The SARS-CoV-2 RNA is generally detectable in upper and lower respiratory specimens during the acute phase of infection. The lowest concentration of SARS-CoV-2 viral copies this assay can detect is 250 copies / mL. A negative result does not preclude SARS-CoV-2 infection and should not be used as the sole basis for treatment or other patient management decisions.  A negative result may occur with improper specimen collection / handling, submission of specimen other than nasopharyngeal swab, presence of viral mutation(s) within the areas targeted by this assay, and inadequate number of viral copies (<250 copies / mL). A negative result must be combined with clinical observations, patient history, and epidemiological information.  Fact Sheet for Patients:   StrictlyIdeas.no  Fact Sheet for Healthcare Providers: BankingDealers.co.za  This test is not yet approved or  cleared by the [place] FDA and has been authorized for detection and/or diagnosis of SARS-CoV-2 by FDA under an Emergency Use Authorization (EUA).  This EUA will remain in effect (meaning this test can be used) for the duration of  the COVID-19 declaration under Section 564(b)(1) of the Act, 21 U.S.C. section 360bbb-3(b)(1), unless the authorization is terminated or revoked sooner.  Performed at [facility], [address] [place], [place] [address]   SARS CORONAVIRUS 2 (TAT 6-24 HRS) Nasopharyngeal Nasopharyngeal Swab     Status: None   Collection Time: 01/29/21  8:21 PM   Specimen: Nasopharyngeal Swab  Result Value Ref Range Status   SARS Coronavirus 2 NEGATIVE NEGATIVE  Final    Comment: (NOTE) SARS-CoV-2 target nucleic acids are NOT DETECTED.  The SARS-CoV-2 RNA is generally detectable in upper and lower respiratory specimens during the acute phase of infection. Negative results do not preclude SARS-CoV-2 infection, do not rule out co-infections with other pathogens, and should not be used as the sole basis for treatment or other patient management decisions. Negative results must be combined with clinical observations, patient history, and epidemiological information. The expected result is Negative.  Fact Sheet for Patients: SugarRoll.be  Fact Sheet for Healthcare Providers: https://www.woods-mathews.com/  This test is not yet approved or cleared by the [place] FDA and  has been authorized for detection and/or diagnosis of SARS-CoV-2 by FDA under an Emergency Use Authorization (EUA). This EUA will remain  in effect (meaning this test can be used) for the duration of the COVID-19 declaration under Se ction 564(b)(1) of the Act, 21 U.S.C. section 360bbb-3(b)(1), unless the authorization is terminated or revoked sooner.  Performed at [facility], [place] [address], [place], [place] [address]          Radiology Studies: DG Chest 2 View  Result Date: 01/29/2021 CLINICAL DATA:  Hypoxia. EXAM: CHEST - 2 VIEW COMPARISON:  Chest x-ray dated January 27, 2021. FINDINGS: The heart size and mediastinal contours are within normal limits. Normal  pulmonary vascularity. Low lung volumes with mild bibasilar atelectasis. No focal consolidation, pleural effusion, or pneumothorax. No acute osseous abnormality. Unchanged stimulator pack overlying the right chest. IMPRESSION: 1. Low lung volumes with mild bibasilar atelectasis. Electronically Signed   By: Titus Dubin M.D.   On: 01/29/2021 18:43   CT Head Wo Contrast  Result Date: 01/29/2021 CLINICAL DATA:  Head trauma, minor (Age >= 65y) multiple falls on eliquis, progressive generalized weakness with right sided headache since sunday EXAM: CT HEAD WITHOUT CONTRAST TECHNIQUE: Contiguous axial images were obtained from the base of the skull through the vertex without intravenous contrast. COMPARISON:  Head CT 2 days ago 01/27/2021 FINDINGS: Brain: Deep brain stimulator from a left frontal approach unchanged with tip in the deep basal ganglia. No intracranial hemorrhage, mass effect, or midline shift. No hydrocephalus. The basilar cisterns are patent. Stable degree of periventricular white matter hypodensity typical of chronic small vessel ischemia. Punctate right basal ganglia lacunar infarct. No evidence of territorial infarct or acute ischemia. No extra-axial or intracranial fluid collection. Vascular: Atherosclerosis of skullbase vasculature without hyperdense vessel or abnormal calcification. Skull: No fracture or focal lesion. Sinuses/Orbits: Bilateral cataract resection. Metallic density within the inferior left orbit, unchanged. Punctate metallic density just lateral to the right orbit. Paranasal sinuses are clear. Mastoid air cells are well aerated. Other: None. IMPRESSION: 1. No acute intracranial abnormality. No skull fracture. 2. Stable chronic small vessel ischemia. Deep brain stimulator in place. Electronically Signed   By: Keith Rake M.D.   On: 01/29/2021 20:12   CT Cervical Spine Wo Contrast  Result Date: 01/29/2021 CLINICAL DATA:  Neck trauma (Age >= 65y) EXAM: CT CERVICAL SPINE  WITHOUT CONTRAST TECHNIQUE: Multidetector CT imaging of the cervical spine was performed without intravenous contrast. Multiplanar CT image reconstructions were also generated. COMPARISON:  None. FINDINGS: Alignment: Normal. Skull base and vertebrae: No acute fracture. Dens and skull base are intact. No destructive process. Soft tissues and spinal canal: No prevertebral fluid or swelling. No visible canal hematoma. Disc levels: Anterior spurring at multiple levels. Mild disc space narrowing and posterior spurring at C5-C6. Scattered facet hypertrophy. Upper chest: No acute or unexpected findings.  Other: Lead from deep brain stimulator courses in the right soft tissues. IMPRESSION: Mild degenerative change in the cervical spine without acute fracture or subluxation. Electronically Signed   By: Keith Rake M.D.   On: 01/29/2021 20:16   ECHOCARDIOGRAM COMPLETE  Result Date: 01/30/2021    ECHOCARDIOGRAM REPORT   Patient Name:   [name] Date of Exam: 01/30/2021 Medical Rec #:  [id number]       Height:       76.0 in Accession #:    [id number]      Weight:       252.0 lb Date of Birth:  06/08/1951       BSA:          2.444 m Patient Age:    62 years        BP:           141/94 mmHg Patient Gender: M               HR:           86 bpm. Exam Location:  Inpatient Procedure: 2D Echo and Intracardiac Opacification Agent Indications:    CHF-Acute Diastolic N46.27  History:        Patient has prior history of Echocardiogram examinations, most                 recent 08/29/2019. Risk Factors:Hypertension and Diabetes.  Sonographer:    Mikki Santee RDCS (AE) Referring Phys: [id number] [facility]  Sonographer Comments: Technically difficult study due to poor echo windows, Technically challenging study due to limited acoustic windows, suboptimal subcostal window, suboptimal apical window and suboptimal parasternal window. IMPRESSIONS  1. Large pericardial effusion. The pericardial effusion is anterior to the right  ventricle. There is no evidence of increased pericardial pressure. Echodensity within the effusion may suggest exudative nature.  2. Left ventricular ejection fraction, by estimation, is 55 to 60%. The left ventricle has normal function. Left ventricular endocardial border not optimally defined to evaluate regional wall motion. There is moderate concentric left ventricular hypertrophy. Left ventricular diastolic parameters are indeterminate.  3. Right ventricular systolic function was not well visualized. The right ventricular size is not well visualized.  4. The mitral valve was not well visualized. No evidence of mitral valve regurgitation.  5. The aortic valve was not well visualized. Aortic valve regurgitation is not visualized. No aortic stenosis is present.  6. Aortic dilatation noted. There is mild dilatation of the aortic root, measuring 40 mm.  7. The inferior vena cava is normal in size with <50% respiratory variability, suggesting right atrial pressure of 8 mmHg. Comparison(s): A prior study was performed on 08/29/2019. Prior images reviewed side by side. Difficult comparsion- this study is much more technically difficult. Significant increase in pericardial effusion. Primary cardiology team made aware. FINDINGS  Left Ventricle: Left ventricular ejection fraction, by estimation, is 55 to 60%. The left ventricle has normal function. Left ventricular endocardial border not optimally defined to evaluate regional wall motion. Definity contrast agent was given IV to delineate the left ventricular endocardial borders. The left ventricular internal cavity size was normal in size. There is moderate concentric left ventricular hypertrophy. Left ventricular diastolic parameters are indeterminate. Right Ventricle: The right ventricular size is not well visualized. Right vetricular wall thickness was not well visualized. Right ventricular systolic function was not well visualized. Left Atrium: Left atrial size was  normal in size. Right Atrium: Right atrial size was normal in size. Pericardium: A large pericardial  effusion is present. The pericardial effusion is anterior to the right ventricle. Mitral Valve: The mitral valve was not well visualized. No evidence of mitral valve regurgitation. Tricuspid Valve: The tricuspid valve is not well visualized. Tricuspid valve regurgitation is not demonstrated. No evidence of tricuspid stenosis. Aortic Valve: The aortic valve was not well visualized. Aortic valve regurgitation is not visualized. No aortic stenosis is present. Pulmonic Valve: The pulmonic valve was not well visualized. Pulmonic valve regurgitation is not visualized. No evidence of pulmonic stenosis. Aorta: Aortic dilatation noted. There is mild dilatation of the aortic root, measuring 40 mm. Venous: The inferior vena cava is normal in size with less than 50% respiratory variability, suggesting right atrial pressure of 8 mmHg. IAS/Shunts: The atrial septum is grossly normal.  LEFT VENTRICLE PLAX 2D LVIDd:         5.90 cm LVIDs:         4.80 cm LV PW:         1.30 cm LV IVS:        1.40 cm LVOT diam:     2.80 cm LV SV:         70 LV SV Index:   28 LVOT Area:     6.16 cm  RIGHT VENTRICLE RV S prime:     17.90 cm/s TAPSE (M-mode): 2.5 cm LEFT ATRIUM           Index      RIGHT ATRIUM           Index LA diam:      5.00 cm 2.05 cm/m RA Area:     18.30 cm LA Vol (A4C): 20.4 ml 8.35 ml/m RA Volume:   50.50 ml  20.66 ml/m  AORTIC VALVE LVOT Vmax:   74.10 cm/s LVOT Vmean:  47.600 cm/s LVOT VTI:    0.113 m  AORTA Ao Root diam: 3.60 cm  SHUNTS Systemic VTI:  0.11 m Systemic Diam: 2.80 cm Rudean Haskell MD Electronically signed by Rudean Haskell MD Signature Date/Time: 01/30/2021/3:00:35 PM    Final     Scheduled Meds: . amLODipine  10 mg Oral QHS  . atorvastatin  40 mg Oral q1800  . colchicine  0.6 mg Oral BID  . DULoxetine  60 mg Oral Daily  . furosemide  40 mg Oral Daily  . insulin aspart  0-15 Units  Subcutaneous TID WC  . insulin aspart  0-5 Units Subcutaneous QHS  . insulin glargine  85 Units Subcutaneous BID  . losartan  100 mg Oral Daily  . metoprolol tartrate  25 mg Oral BID  . mirtazapine  7.5 mg Oral QHS   Continuous Infusions:   LOS: 0 days    Time spent: 25 mins    Shawna Clamp, MD [facility]   If 7PM-7AM, please contact night-coverage

## 2021-01-31 NOTE — Progress Notes (Signed)
Progress Note  Patient Name: Joshua [name] Date of Encounter: 01/31/2021  [facility] Cardiologist: Donato Heinz, MD   Subjective   No dramatic change in symptoms.  Laying flat comfortable.  Inpatient Medications    Scheduled Meds: . amLODipine  10 mg Oral QHS  . atorvastatin  40 mg Oral q1800  . colchicine  0.6 mg Oral BID  . DULoxetine  60 mg Oral Daily  . furosemide  40 mg Oral Daily  . insulin aspart  0-15 Units Subcutaneous TID WC  . insulin aspart  0-5 Units Subcutaneous QHS  . insulin glargine  85 Units Subcutaneous BID  . losartan  100 mg Oral Daily  . mirtazapine  7.5 mg Oral QHS   Continuous Infusions:  PRN Meds: acetaminophen **OR** acetaminophen   Vital Signs    Vitals:   01/30/21 1100 01/30/21 2044 01/31/21 0433 01/31/21 1021  BP: (!) 141/94 140/87 138/83 (!) 149/82  Pulse: 92 84 83 81  Resp: 20 20 16 16   Temp: 97.7 F (36.5 C) 98.3 F (36.8 C) 98.5 F (36.9 C) 97.8 F (36.6 C)  TempSrc:      SpO2: 99% 98% 94% 98%  Weight:      Height: 6\' 4"  (1.93 m)       Intake/Output Summary (Last 24 hours) at 01/31/2021 1121 Last data filed at 01/31/2021 0955 Gross per 24 hour  Intake 240 ml  Output 550 ml  Net -310 ml   Last 3 Weights 01/29/2021 01/27/2021 01/24/2021  Weight (lbs) 252 lb 257 lb 12.8 oz 257 lb 12.8 oz  Weight (kg) 114.306 kg 116.937 kg 116.937 kg      Telemetry    Sinus rhythm/sinus tachycardia rate 100 with occasional PVCs- Personally Reviewed  ECG    No new- Personally Reviewed  Physical Exam   GEN: No acute distress.   Neck: No JVD Cardiac: RRR, no murmurs, rubs, or gallops.  Respiratory: Clear to auscultation bilaterally. GI: Soft, nontender, non-distended  MS:  Trace edema; No deformity. Neuro:  Nonfocal  Psych: Normal affect   Labs    High Sensitivity Troponin:   Recent Labs  Lab 01/27/21 1517 01/27/21 1651 01/29/21 2021 01/29/21 2143  TROPONINIHS 12 12 12 12       Chemistry Recent Labs  Lab  01/27/21 1247 01/29/21 1817 01/29/21 2021 01/30/21 0437 01/31/21 0305  NA 139 137  --  139 139  K 4.1 3.8  --  3.4* 3.7  CL 102 101  --  104 107  CO2 25 26  --  26 25  GLUCOSE 382* 155*  --  94 119*  BUN 27* 27*  --  23 26*  CREATININE 1.54* 1.53*  --  1.40* 1.62*  CALCIUM 10.1 9.6  --  9.4 8.7*  PROT 7.8  --  7.1  --   --   ALBUMIN 4.3  --  3.6  --   --   AST 18  --  18  --   --   ALT 21  --  19  --   --   ALKPHOS 91  --  86  --   --   BILITOT 0.5  --  0.6  --   --   GFRNONAA 48* 48*  --  54* 45*  ANIONGAP 12 10  --  9 7     Hematology Recent Labs  Lab 01/29/21 1817 01/30/21 0437 01/31/21 0305  WBC 11.2* 9.8 8.3  RBC 4.86 4.96 4.40  HGB  14.4 14.7 13.3  HCT 44.3 43.7 38.4*  MCV 91.2 88.1 87.3  MCH 29.6 29.6 30.2  MCHC 32.5 33.6 34.6  RDW 12.7 12.9 13.1  PLT 251 249 232    BNP Recent Labs  Lab 01/27/21 1247 01/29/21 2021  BNP 16.4 13.4     DDimer  Recent Labs  Lab 01/29/21 2021  DDIMER <0.27     Radiology    DG Chest 2 View  Result Date: 01/29/2021 CLINICAL DATA:  Hypoxia. EXAM: CHEST - 2 VIEW COMPARISON:  Chest x-ray dated January 27, 2021. FINDINGS: The heart size and mediastinal contours are within normal limits. Normal pulmonary vascularity. Low lung volumes with mild bibasilar atelectasis. No focal consolidation, pleural effusion, or pneumothorax. No acute osseous abnormality. Unchanged stimulator pack overlying the right chest. IMPRESSION: 1. Low lung volumes with mild bibasilar atelectasis. Electronically Signed   By: Titus Dubin M.D.   On: 01/29/2021 18:43   CT Head Wo Contrast  Result Date: 01/29/2021 CLINICAL DATA:  Head trauma, minor (Age >= 65y) multiple falls on eliquis, progressive generalized weakness with right sided headache since sunday EXAM: CT HEAD WITHOUT CONTRAST TECHNIQUE: Contiguous axial images were obtained from the base of the skull through the vertex without intravenous contrast. COMPARISON:  Head CT 2 days ago 01/27/2021  FINDINGS: Brain: Deep brain stimulator from a left frontal approach unchanged with tip in the deep basal ganglia. No intracranial hemorrhage, mass effect, or midline shift. No hydrocephalus. The basilar cisterns are patent. Stable degree of periventricular white matter hypodensity typical of chronic small vessel ischemia. Punctate right basal ganglia lacunar infarct. No evidence of territorial infarct or acute ischemia. No extra-axial or intracranial fluid collection. Vascular: Atherosclerosis of skullbase vasculature without hyperdense vessel or abnormal calcification. Skull: No fracture or focal lesion. Sinuses/Orbits: Bilateral cataract resection. Metallic density within the inferior left orbit, unchanged. Punctate metallic density just lateral to the right orbit. Paranasal sinuses are clear. Mastoid air cells are well aerated. Other: None. IMPRESSION: 1. No acute intracranial abnormality. No skull fracture. 2. Stable chronic small vessel ischemia. Deep brain stimulator in place. Electronically Signed   By: Melanie  Sanford M.D.   On: 01/29/2021 20:12   CT Cervical Spine Wo Contrast  Result Date: 01/29/2021 CLINICAL DATA:  Neck trauma (Age >= 65y) EXAM: CT CERVICAL SPINE WITHOUT CONTRAST TECHNIQUE: Multidetector CT imaging of the cervical spine was performed without intravenous contrast. Multiplanar CT image reconstructions were also generated. COMPARISON:  None. FINDINGS: Alignment: Normal. Skull base and vertebrae: No acute fracture. Dens and skull base are intact. No destructive process. Soft tissues and spinal canal: No prevertebral fluid or swelling. No visible canal hematoma. Disc levels: Anterior spurring at multiple levels. Mild disc space narrowing and posterior spurring at C5-C6. Scattered facet hypertrophy. Upper chest: No acute or unexpected findings. Other: Lead from deep brain stimulator courses in the right soft tissues. IMPRESSION: Mild degenerative change in the cervical spine without acute  fracture or subluxation. Electronically Signed   By: Melanie  Sanford M.D.   On: 01/29/2021 20:16   ECHOCARDIOGRAM COMPLETE  Result Date: 01/30/2021    ECHOCARDIOGRAM REPORT   Patient Name:   Zander A Caccavale Date of Exam: 01/30/2021 Medical Rec #:  [id number]       Height:       76.0 in Accession #:    [id number]      Weight:       25 2.0 lb Date of Birth:  [birth year]       BSA:  2.444 m Patient Age:    70 years        BP:           141/94 mmHg Patient Gender: M               HR:           86 bpm. Exam Location:  Inpatient Procedure: 2D Echo and Intracardiac Opacification Agent Indications:    CHF-Acute Diastolic X21.19  History:        Patient has prior history of Echocardiogram examinations, most                 recent 08/29/2019. Risk Factors:Hypertension and Diabetes.  Sonographer:    Mikki Santee RDCS (AE) Referring Phys: [id number] [facility]  Sonographer Comments: Technically difficult study due to poor echo windows, Technically challenging study due to limited acoustic windows, suboptimal subcostal window, suboptimal apical window and suboptimal parasternal window. IMPRESSIONS  1. Large pericardial effusion. The pericardial effusion is anterior to the right ventricle. There is no evidence of increased pericardial pressure. Echodensity within the effusion may suggest exudative nature.  2. Left ventricular ejection fraction, by estimation, is 55 to 60%. The left ventricle has normal function. Left ventricular endocardial border not optimally defined to evaluate regional wall motion. There is moderate concentric left ventricular hypertrophy. Left ventricular diastolic parameters are indeterminate.  3. Right ventricular systolic function was not well visualized. The right ventricular size is not well visualized.  4. The mitral valve was not well visualized. No evidence of mitral valve regurgitation.  5. The aortic valve was not well visualized. Aortic valve regurgitation is not visualized. No  aortic stenosis is present.  6. Aortic dilatation noted. There is mild dilatation of the aortic root, measuring 40 mm.  7. The inferior vena cava is normal in size with <50% respiratory variability, suggesting right atrial pressure of 8 mmHg. Comparison(s): A prior study was performed on 08/29/2019. Prior images reviewed side by side. Difficult comparsion- this study is much more technically difficult. Significant increase in pericardial effusion. Primary cardiology team made aware. FINDINGS  Left Ventricle: Left ventricular ejection fraction, by estimation, is 55 to 60%. The left ventricle has normal function. Left ventricular endocardial border not optimally defined to evaluate regional wall motion. Definity contrast agent was given IV to delineate the left ventricular endocardial borders. The left ventricular internal cavity size was normal in size. There is moderate concentric left ventricular hypertrophy. Left ventricular diastolic parameters are indeterminate. Right Ventricle: The right ventricular size is not well visualized. Right vetricular wall thickness was not well visualized. Right ventricular systolic function was not well visualized. Left Atrium: Left atrial size was normal in size. Right Atrium: Right atrial size was normal in size. Pericardium: A large pericardial effusion is present. The pericardial effusion is anterior to the right ventricle. Mitral Valve: The mitral valve was not well visualized. No evidence of mitral valve regurgitation. Tricuspid Valve: The tricuspid valve is not well visualized. Tricuspid valve regurgitation is not demonstrated. No evidence of tricuspid stenosis. Aortic Valve: The aortic valve was not well visualized. Aortic valve regurgitation is not visualized. No aortic stenosis is present. Pulmonic Valve: The pulmonic valve was not well visualized. Pulmonic valve regurgitation is not visualized. No evidence of pulmonic stenosis. Aorta: Aortic dilatation noted. There is mild  dilatation of the aortic root, measuring 40 mm. Venous: The inferior vena cava is normal in size with less than 50% respiratory variability, suggesting right atrial pressure of 8 mmHg. IAS/Shunts: The  atrial septum is grossly normal.  LEFT VENTRICLE PLAX 2D LVIDd:         5.90 cm LVIDs:         4.80 cm LV PW:         1.30 cm LV IVS:        1.40 cm LVOT diam:     2.80 cm LV SV:         70 LV SV Index:   28 LVOT Area:     6.16 cm  RIGHT VENTRICLE RV S prime:     17.90 cm/s TAPSE (M-mode): 2.5 cm LEFT ATRIUM           Index      RIGHT ATRIUM           Index LA diam:      5.00 cm 2.05 cm/m RA Area:     18.30 cm LA Vol (A4C): 20.4 ml 8.35 ml/m RA Volume:   50.50 ml  20.66 ml/m  AORTIC VALVE LVOT Vmax:   74.10 cm/s LVOT Vmean:  47.600 cm/s LVOT VTI:    0.113 m  AORTA Ao Root diam: 3.60 cm  SHUNTS Systemic VTI:  0.11 m Systemic Diam: 2.80 cm Rudean Haskell MD Electronically signed by Rudean Haskell MD Signature Date/Time: 01/30/2021/3:00:35 PM    Final     Cardiac Studies   1.5 cm moderate pericardial effusion.  No tamponade normal EF  Patient Profile     70 y.o. male with newly discovered pericardial effusion.  Shortness of breath.  Hypertrophic cardiomyopathy  Assessment & Plan    -Colchicine started as anti-inflammatory for pericardial effusion. -Holding Eliquis.  2 years ago, subclavian artery thrombosis noted. -Discussed case with interventional cardiology team member, Dr. Claiborne Billings who looked at echocardiogram images.  We do not feel that there is an adequate window for tapping, nor is there any impending need for fluid removal. -Continue with conservative management. -Creatinine from 1.53, 1.4, 1.62.  -I will add metoprolol tartrate 25 mg twice a day given hypertrophic cardiomyopathy.  It would be nice for him to be on beta-blocker therapy.      For questions or updates, please contact [employer] Please consult [email or web address] for contact info under        Signed, Candee Furbish, MD  01/31/2021, 11:21 AM

## 2021-01-31 NOTE — Evaluation (Signed)
Physical Therapy Evaluation Patient Details Name: Joshua [name] MRN: [id number] DOB: 15-May-1951 Today's Date: 01/31/2021   History of Present Illness  70yo male admitted 01/29/21 with c/o generalized weakness and fatigue, DOE and found to be hypoxic on room air. Admitted with acute hypoxemic respiratory failure. PMH BPPV, CVA, DM, HLD, gout, HTN, optic neuropathy, essential tremor, DBS, TKA  Clinical Impression   Patient received in bed, very pleasant and cooperative with therapy. Able to mobilize mostly on a min guard basis with one time MinA to stand up from lower surface but otherwise steady and stable with RW today. VSS on RA with gait in hallway, but did become mildly SOB with activity. Left up in recliner with all needs met. Will benefit from skilled OP PT f/u  Moving forward.     Follow Up Recommendations Outpatient PT    Equipment Recommendations  Rolling walker with 5" wheels    Recommendations for Other Services       Precautions / Restrictions Precautions Precautions: Fall;Other (comment) Precaution Comments: monitor O2, SOB, hx of vertigo Restrictions Weight Bearing Restrictions: No      Mobility  Bed Mobility Overal bed mobility: Modified Independent                  Transfers Overall transfer level: Needs assistance Equipment used: Rolling walker (2 wheeled) Transfers: Sit to/from Stand Sit to Stand: Min guard         General transfer comment: Min guard/increased effort and time needed, one time MinA from getting up from lower height surface  Ambulation/Gait Ambulation/Gait assistance: Min guard Gait Distance (Feet): 140 Feet Assistive device: Rolling walker (2 wheeled) Gait Pattern/deviations: Step-through pattern;Trunk flexed Gait velocity: decreased   General Gait Details: slow but steady with RW, VSS on RA but did become mildly SOB at end of gait distance  Stairs            Wheelchair Mobility    Modified Rankin (Stroke Patients  Only)       Balance Overall balance assessment: Needs assistance;History of Falls Sitting-balance support: No upper extremity supported;Feet supported Sitting balance-Leahy Scale: Good     Standing balance support: No upper extremity supported;During functional activity Standing balance-Leahy Scale: Fair Standing balance comment: benefits from BUE support                             Pertinent Vitals/Pain Pain Assessment: No/denies pain    Home Living                        Prior Function                 Hand Dominance        Extremity/Trunk Assessment   Upper Extremity Assessment Upper Extremity Assessment: Defer to OT evaluation    Lower Extremity Assessment Lower Extremity Assessment: Generalized weakness    Cervical / Trunk Assessment Cervical / Trunk Assessment: Normal  Communication      Cognition Arousal/Alertness: Awake/alert Behavior During Therapy: WFL for tasks assessed/performed Overall Cognitive Status: Within Functional Limits for tasks assessed                                        General Comments General comments (skin integrity, edema, etc.): VSS on RA wtih activity- HR in 70-80s and SPO2 stable in 90s  with gait    Exercises     Assessment/Plan    PT Assessment Patient needs continued PT services  PT Problem List Decreased strength;Decreased knowledge of use of DME;Decreased activity tolerance;Decreased safety awareness;Decreased balance;Decreased mobility       PT Treatment Interventions DME instruction;Balance training;Gait training;Stair training;Functional mobility training;Patient/family education;Therapeutic activities;Therapeutic exercise    PT Goals (Current goals can be found in the Care Plan section)  Acute Rehab PT Goals Patient Stated Goal: identify reason for desats, dizziness, etc PT Goal Formulation: With patient Time For Goal Achievement: 02/14/21 Potential to Achieve  Goals: Good    Frequency Min 3X/week   Barriers to discharge        Co-evaluation               AM-PAC PT "6 Clicks" Mobility  Outcome Measure Help needed turning from your back to your side while in a flat bed without using bedrails?: None Help needed moving from lying on your back to sitting on the side of a flat bed without using bedrails?: None Help needed moving to and from a bed to a chair (including a wheelchair)?: A Little Help needed standing up from a chair using your arms (e.g., wheelchair or bedside chair)?: A Little Help needed to walk in hospital room?: A Little Help needed climbing 3-5 steps with a railing? : A Little 6 Click Score: 20    End of Session Equipment Utilized During Treatment: Gait belt Activity Tolerance: Patient tolerated treatment well Patient left: in chair;with call bell/phone within reach Nurse Communication: Mobility status PT Visit Diagnosis: Muscle weakness (generalized) (M62.81);History of falling (Z91.81);Difficulty in walking, not elsewhere classified (R26.2)    Time: 2761-8485 PT Time Calculation (min) (ACUTE ONLY): 37 min   Charges:   PT Evaluation $PT Eval Moderate Complexity: 1 Mod PT Treatments $Gait Training: 8-22 mins        Windell Norfolk, DPT, PN1   Supplemental Physical Therapist [place]    Pager [phone] Acute Rehab Office [phone]

## 2021-01-31 NOTE — Progress Notes (Signed)
Inpatient Diabetes Program Recommendations  AACE/ADA: New Consensus Statement on Inpatient Glycemic Control (2015)  Target Ranges:  Prepandial:   less than 140 mg/dL      Peak postprandial:   less than 180 mg/dL (1-2 hours)      Critically ill patients:  140 - 180 mg/dL   Lab Results  Component Value Date   GLUCAP 183 (H) 01/31/2021   HGBA1C 8.9 (H) 01/30/2021    Review of Glycemic Control  Diabetes history: DM2 Outpatient Diabetes medications: Toujeo 85 units BID, Humalog 15-25-25 TID, metformin 1000 mg BID Current orders for Inpatient glycemic control: Lantus 85 units BID, Novolog 0-15 units TID with meals and 0-5 HS  HgbA1C - 8.9% Hypo this am - 62 mg/dL Eating 100%.  Inpatient Diabetes Program Recommendations:     Decrease Lantus to 80 units BID If post-prandials > 180 mg/dL, add Novolog 4 units TID with meals  Follow glucose trends.  Thank you. Lorenda Peck, RD, LDN, CDE Inpatient Diabetes Coordinator [phone]

## 2021-01-31 NOTE — Plan of Care (Signed)

## 2021-02-01 DIAGNOSIS — R0609 Other forms of dyspnea: Secondary | ICD-10-CM | POA: Diagnosis not present

## 2021-02-01 DIAGNOSIS — I10 Essential (primary) hypertension: Secondary | ICD-10-CM | POA: Diagnosis not present

## 2021-02-01 DIAGNOSIS — J9601 Acute respiratory failure with hypoxia: Secondary | ICD-10-CM | POA: Diagnosis not present

## 2021-02-01 DIAGNOSIS — I313 Pericardial effusion (noninflammatory): Secondary | ICD-10-CM | POA: Diagnosis not present

## 2021-02-01 DIAGNOSIS — E876 Hypokalemia: Secondary | ICD-10-CM | POA: Diagnosis not present

## 2021-02-01 LAB — CBC
HCT: 42.8 % (ref 39.0–52.0)
Hemoglobin: 14.1 g/dL (ref 13.0–17.0)
MCH: 30 pg (ref 26.0–34.0)
MCHC: 32.9 g/dL (ref 30.0–36.0)
MCV: 91.1 fL (ref 80.0–100.0)
Platelets: 141 10*3/uL — ABNORMAL LOW (ref 150–400)
RBC: 4.7 MIL/uL (ref 4.22–5.81)
RDW: 12.9 % (ref 11.5–15.5)
WBC: 8.8 10*3/uL (ref 4.0–10.5)
nRBC: 0 % (ref 0.0–0.2)

## 2021-02-01 LAB — GLUCOSE, CAPILLARY
Glucose-Capillary: 51 mg/dL — ABNORMAL LOW (ref 70–99)
Glucose-Capillary: 86 mg/dL (ref 70–99)
Glucose-Capillary: 244 mg/dL — ABNORMAL HIGH (ref 70–99)
Glucose-Capillary: 164 mg/dL — ABNORMAL HIGH (ref 70–99)
Glucose-Capillary: 179 mg/dL — ABNORMAL HIGH (ref 70–99)

## 2021-02-01 LAB — BASIC METABOLIC PANEL
Anion gap: 9 (ref 5–15)
BUN: 30 mg/dL — ABNORMAL HIGH (ref 8–23)
CO2: 24 mmol/L (ref 22–32)
Calcium: 8.8 mg/dL — ABNORMAL LOW (ref 8.9–10.3)
Chloride: 107 mmol/L (ref 98–111)
Creatinine, Ser: 1.55 mg/dL — ABNORMAL HIGH (ref 0.61–1.24)
GFR, Estimated: 48 mL/min — ABNORMAL LOW (ref >60)
Glucose, Bld: 72 mg/dL (ref 70–99)
Potassium: 3.9 mmol/L (ref 3.5–5.1)
Sodium: 140 mmol/L (ref 135–145)

## 2021-02-01 LAB — MAGNESIUM: Magnesium: 1.8 mg/dL (ref 1.7–2.4)

## 2021-02-01 LAB — PHOSPHORUS: Phosphorus: 4 mg/dL (ref 2.5–4.6)

## 2021-02-01 NOTE — Progress Notes (Addendum)
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  Patient has seen cardiology 5 days ago for progressive dyspnea on exertion which was felt to represent anginal equivalent. Patient was scheduled for outpatient stress test to evaluate for ischemia.  He went to see his primary care physician and was told to go to emergency department because of hypoxia,  his O2 saturation dropped to 86% with ambulation.  CT head negative for any intracranial abnormality, CT C-spine negative. Patient is admitted for acute hypoxic respiratory failure possibly secondary to hypertrophic cardiomyopathy.  Cardiology consulted.  Patient might need cardiac work-up during hospitalization.  Assessment & Plan:   Principal Problem:   Acute hypoxemic respiratory failure (HCC) Active Problems:   Essential hypertension   Hyperlipidemia   Chronic kidney disease   DOE (dyspnea on exertion)   Acute hypoxemic respiratory failure: > Improved. Patient reports progressive shortness of breath on exertion. He has seen his cardiologist recently and was scheduled to have outpatient stress test. His sats dropped to 84% with ambulation at PCPs office.   At present, not hypoxemic and no signs of respiratory distress.   Covid test negative, PE less likely as he is not tachycardic.   He is on chronic anticoagulation with Eliquis and reports compliance.   His D-dimer is negative.  He has a history of hypertrophic cardiomyopathy.   He was recently seen by cardiology for progressive dyspnea on exertion and symptoms were felt to represent anginal  equivalent.   At present, he is not endorsing any chest pain.  Initial high-sensitivity troponin negative,  EKG without acute changes. Continuous pulse ox, supplemental oxygen as needed.  TTE shows large pericardial effusion but no signs of tamponade. Cardiology consulted, started on colchicine.  Eliquis is on hold. He is saturating 96% on room air.  Large pericardial effusion: Patient remains asymptomatic, lying flat on the bed. Echocardiogram showed large pericardial effusion.  No signs of cardiac tamponade. Started on colchicine, Eliquis is on hold. Interventional radiologist and cardiologist discussed. It seems like there isn't adequate window for tapping , nor there is any impending need for fluid removal.   Continue with conservative management. Cardiologist states patient is stable from cardio perspective for discharge and will schedule outpatient repeat echo in one week.  Generalized weakness / Falls No fever or significant leukocytosis on labs.   Chest x-ray not suggestive of pneumonia.  UA without signs of infection.  Head CT negative for acute finding.  Suspect weakness is due to cardiac etiology. -Work-up as mentioned above.    TSH normal.  PT/OT eval, fall precautions.  Right eye pain: Patient states he had some mild dull pain behind his right eye for 2 days which has now resolved.   GCA less likely as he denies blurry vision or change in his vision.  Not endorsing any symptoms of jaw claudication.  Afebrile. ESR and CRP slightly elevated.  Asymmetric lower extremity edema:  Patient noted to have mild pitting edema of the left lower extremity.  No erythema or warmth to touch.  DVT less likely as he was on Eliquis and reports compliance.  D-dimer negative.  He  was previously on hydrochlorothiazide which was stopped by cardiology due to complaint of lightheadedness which was felt to be due to diuretic use in the setting of hypertrophic cardiomyopathy.  Currently on Lasix 40 mg  daily. Continue home Lasix  Hypertension:  -Continue amlodipine, Lasix, and losartan  Insulin-dependent type 2 diabetes:  A1c 7.8 in 10/2020 per note from PCPs office. Hold home Metformin.  Continue home long-acting insulin.   Continue sliding scale insulin moderate ACHS.   OSA Continue CPAP at night  History of cardioembolic CVA Hold Eliquis, Continue statin  CKD stage IIIa: Creatinine 1.5, at baseline. Continue to monitor  Hyperlipidemia Continue Lipitor, fenofibrate   DVT prophylaxis: SCDs Code Status: Full code Family Communication: No family at bedside Disposition Plan:   Status is: Observation  The patient will require care spanning > 2 midnights and should be moved to inpatient because: Inpatient level of care appropriate due to severity of illness  Dispo: The patient is from: Home              Anticipated d/c is to: Home in 1 day              Patient currently is not medically stable to d/c.   Difficult to place patient No   Consultants:   Cardiology  Procedures:  Antimicrobials:  Anti-infectives (From admission, onward)   None       Subjective: Patient was seen and examined at bedside.  Overnight events noted.   Patient reports feeling better, denies any chest pain and shortness of breath. He is lying comfortably and flat on the bed, reports intermittent headache.  Objective: Vitals:   01/31/21 1824 01/31/21 1929 02/01/21 0400 02/01/21 1041  BP: 127/77 116/80 125/77 136/82  Pulse: 74 75 70 70  Resp: _0 Temp: 97.8 F (36.6 C) 97.9 F (36.6 C) 98 F (36.7 C) (!) 97.5 F (36.4 C)  TempSrc:  Oral Oral   SpO2: 100% 99% 95% 97%  Weight:      Height:        Intake/Output Summary (Last 24 hours) at 02/01/2021 1443 Last data filed at 02/01/2021 1300 Gross per 24 hour  Intake 720 ml  Output --  Net 720 ml   Filed Weights   01/29/21 1900  Weight: 114.3 kg    Examination:  General exam: Appears calm and comfortable, not  in any acute distress. Respiratory system: Clear to auscultation. Respiratory effort normal. Cardiovascular system: S1 & S2 heard, RRR. No JVD, murmurs, rubs, gallops or clicks. No pedal edema. Gastrointestinal system: Abdomen is nondistended, soft and nontender. No organomegaly or masses felt. Normal bowel sounds heard. Central nervous system: Alert and oriented. No focal neurological deficits. Extremities: Symmetric 5 x 5 power.  Left lower extremity edema. Skin: No rashes, lesions or ulcers Psychiatry: Judgement and insight appear normal. Mood & affect appropriate.     Data Reviewed: I have personally reviewed following labs and imaging studies  CBC: Recent Labs  Lab 01/27/21 1247 01/29/21 1817 01/30/21 0437 01/31/21 0305 02/01/21 0408  WBC 8.3 11.2* 9.8 8.3 8.8  NEUTROABS 4.7  --   --   --   --   HGB 15.3 14.4 14.7 13.3 14.1  HCT 45.8 44.3 43.7 38.4* 42.8  MCV 89.6 91.2 88.1 87.3 91.1  PLT 269 251 249 232 111*   Basic Metabolic Panel: Recent Labs  Lab 01/27/21 1247 01/29/21 1817 01/30/21 0437 01/30/21 1834 01/31/21 0305 02/01/21 0408  NA 139 137 139  --  139 140  K 4.1 3.8 3.4*  --  3.7 3.9  CL 102 101 104  --  107 107  CO2 _0 --  25 24  GLUCOSE 382* 155* 94  --  119* 72  BUN 27* 27* 23  --  26* 30*  CREATININE 1.54* 1.53* 1.40*  --  1.62* 1.55*  CALCIUM 10.1 9.6 9.4  --  8.7* 8.8*  MG  --   --   --  1.7 1.9 1.8  PHOS  --   --   --   --  3.6 4.0   GFR: Estimated Creatinine Clearance: 60.5 mL/min (A) (by C-G formula based on SCr of 1.55 mg/dL (H)). Liver Function Tests: Recent Labs  Lab 01/27/21 1247 01/29/21 2021  AST 18 18  ALT 21 19  ALKPHOS 91 86  BILITOT 0.5 0.6  PROT 7.8 7.1  ALBUMIN 4.3 3.6   No results for input(s): LIPASE, AMYLASE in the last 168 hours. No results for input(s): AMMONIA in the last 168 hours. Coagulation Profile: Recent Labs  Lab 01/27/21 1247  INR 1.0   Cardiac Enzymes: Recent Labs  Lab 01/30/21 1834   CKTOTAL 63   BNP (last 3 results) No results for input(s): PROBNP in the last 8760 hours. HbA1C: Recent Labs    01/30/21 0437  HGBA1C 8.9*   CBG: Recent Labs  Lab 01/31/21 1652 01/31/21 1941 02/01/21 0657 02/01/21 0736 02/01/21 1140  GLUCAP 183* 149* 51* 86 244*   Lipid Profile: No results for input(s): CHOL, HDL, LDLCALC, TRIG, CHOLHDL, LDLDIRECT in the last 72 hours. Thyroid Function Tests: Recent Labs    01/30/21 0437  TSH 1.421   Anemia Panel: No results for input(s): VITAMINB12, FOLATE, FERRITIN, TIBC, IRON, RETICCTPCT in the last 72 hours. Sepsis Labs: No results for input(s): PROCALCITON, LATICACIDVEN in the last 168 hours.  Recent Results (from the past 240 hour(s))  SARS Coronavirus 2 by RT PCR (hospital order, performed in [place] hospital lab)     Status: None   Collection Time: 01/27/21  3:25 PM  Result Value Ref Range Status   SARS Coronavirus 2 NEGATIVE NEGATIVE Final    Comment: (NOTE) SARS-CoV-2 target nucleic acids are NOT DETECTED.  The SARS-CoV-2 RNA is generally detectable in upper and lower respiratory specimens during the acute phase of infection. The lowest concentration of SARS-CoV-2 viral copies this assay can detect is 250 copies / mL. A negative result does not preclude SARS-CoV-2 infection and should not be used as the sole basis for treatment or other patient management decisions.  A negative result may occur with improper specimen collection / handling, submission of specimen other than nasopharyngeal swab, presence of viral mutation(s) within the areas targeted by this assay, and inadequate number of viral copies (<250 copies / mL). A negative result must be combined with clinical observations, patient history, and epidemiological information.  Fact Sheet for Patients:   StrictlyIdeas.no  Fact Sheet for Healthcare Providers: BankingDealers.co.za  This test is not yet approved  or  cleared by the [place] FDA and has been authorized for detection and/or diagnosis of SARS-CoV-2 by FDA under an Emergency Use Authorization (EUA).  This EUA will remain in effect (meaning this test can be used) for the duration of the COVID-19 declaration under Section 564(b)(1) of the Act, 21 U.S.C. section 360bbb-3(b)(1), unless the authorization is terminated or revoked sooner.  Performed at [facility], [address], [place], [place] [address]   SARS  CORONAVIRUS 2 (TAT 6-24 HRS) Nasopharyngeal Nasopharyngeal Swab     Status: None   Collection Time: 01/29/21  8:21 PM   Specimen: Nasopharyngeal Swab  Result Value Ref Range Status   SARS Coronavirus 2 NEGATIVE NEGATIVE Final    Comment: (NOTE) SARS-CoV-2 target nucleic acids are NOT DETECTED.  The SARS-CoV-2 RNA is generally detectable in upper and lower respiratory specimens during the acute phase of infection. Negative results do not preclude SARS-CoV-2 infection, do not rule out co-infections with other pathogens, and should not be used as the sole basis for treatment or other patient management decisions. Negative results must be combined with clinical observations, patient history, and epidemiological information. The expected result is Negative.  Fact Sheet for Patients: SugarRoll.be  Fact Sheet for Healthcare Providers: https://www.woods-mathews.com/  This test is not yet approved or cleared by the [place] FDA and  has been authorized for detection and/or diagnosis of SARS-CoV-2 by FDA under an Emergency Use Authorization (EUA). This EUA will remain  in effect (meaning this test can be used) for the duration of the COVID-19 declaration under Se ction 564(b)(1) of the Act, 21 U.S.C. section 360bbb-3(b)(1), unless the authorization is terminated or revoked sooner.  Performed at [facility], [place] [address], [place], [place] [address]       Radiology Studies: No results found.  Scheduled Meds: . amLODipine  10 mg Oral QHS  . atorvastatin  40 mg Oral q1800  . colchicine  0.6 mg Oral BID  . DULoxetine  60 mg Oral Daily  . furosemide  40 mg Oral Daily  . insulin aspart  0-15 Units Subcutaneous TID WC  . insulin aspart  0-5 Units Subcutaneous QHS  . insulin glargine  85 Units Subcutaneous BID  . losartan  100 mg Oral Daily  . metoprolol tartrate  25 mg Oral BID  . mirtazapine  7.5 mg Oral QHS   Continuous Infusions:   LOS: 0 days    Time spent: 25 mins   Shawna Clamp, MD [facility]   If 7PM-7AM, please contact night-coverage

## 2021-02-01 NOTE — Progress Notes (Signed)
Progress Note  Patient Name: Joshua [name] Date of Encounter: 02/01/2021  [facility] Cardiologist: Donato Heinz, MD   Subjective  Feels well currently.  No significant chest pain.  Minimal shortness of breath.  Inpatient Medications    Scheduled Meds: . amLODipine  10 mg Oral QHS  . atorvastatin  40 mg Oral q1800  . colchicine  0.6 mg Oral BID  . DULoxetine  60 mg Oral Daily  . furosemide  40 mg Oral Daily  . insulin aspart  0-15 Units Subcutaneous TID WC  . insulin aspart  0-5 Units Subcutaneous QHS  . insulin glargine  85 Units Subcutaneous BID  . losartan  100 mg Oral Daily  . metoprolol tartrate  25 mg Oral BID  . mirtazapine  7.5 mg Oral QHS   Continuous Infusions:  PRN Meds: acetaminophen **OR** acetaminophen   Vital Signs    Vitals:   01/31/21 1824 01/31/21 1929 02/01/21 0400 02/01/21 1041  BP: 127/77 116/80 125/77 136/82  Pulse: 74 75 70 70  Resp: 18 18 18 15   Temp: 97.8 F (36.6 C) 97.9 F (36.6 C) 98 F (36.7 C) (!) 97.5 F (36.4 C)  TempSrc:  Oral Oral   SpO2: 100% 99% 95% 97%  Weight:      Height:        Intake/Output Summary (Last 24 hours) at 02/01/2021 1219 Last data filed at 01/31/2021 1900 Gross per 24 hour  Intake 600 ml  Output --  Net 600 ml   Last 3 Weights 01/29/2021 01/27/2021 01/24/2021  Weight (lbs) 252 lb 257 lb 12.8 oz 257 lb 12.8 oz  Weight (kg) 114.306 kg 116.937 kg 116.937 kg      Telemetry    Sinus rhythm, improved into the 70s with occasional PVCs- Personally Reviewed  ECG    No new- Personally Reviewed  Physical Exam   GEN: Well nourished, well developed, in no acute distress  HEENT: normal  Neck: no JVD, carotid bruits, or masses Cardiac: RRR; no murmurs, rubs, or gallops,no edema  Respiratory:  clear to auscultation bilaterally, normal work of breathing GI: soft, nontender, nondistended, + BS MS: no deformity or atrophy  Skin: warm and dry, no rash Neuro:  Alert and Oriented x 3, Strength and  sensation are intact Psych: euthymic mood, full affect   Labs    High Sensitivity Troponin:   Recent Labs  Lab 01/27/21 1517 01/27/21 1651 01/29/21 2021 01/29/21 2143  TROPONINIHS 12 12 12 12       Chemistry Recent Labs  Lab 01/27/21 1247 01/29/21 1817 01/29/21 2021 01/30/21 0437 01/31/21 0305 02/01/21 0408  NA 139   < >  --  139 139 140  K 4.1   < >  --  3.4* 3.7 3.9  CL 102   < >  --  104 107 107  CO2 25   < >  --  26 25 24   GLUCOSE 382*   < >  --  94 119* 72  BUN 27*   < >  --  23 26* 30*  CREATININE 1.54*   < >  --  1.40* 1.62* 1.55*  CALCIUM 10.1   < >  --  9.4 8.7* 8.8*  PROT 7.8  --  7.1  --   --   --   ALBUMIN 4.3  --  3.6  --   --   --   AST 18  --  18  --   --   --  ALT 21  --  19  --   --   --   ALKPHOS 91  --  86  --   --   --   BILITOT 0.5  --  0.6  --   --   --   GFRNONAA 48*   < >  --  54* 45* 48*  ANIONGAP 12   < >  --  9 7 9    < > = values in this interval not displayed.     Hematology Recent Labs  Lab 01/30/21 0437 01/31/21 0305 02/01/21 0408  WBC 9.8 8.3 8.8  RBC 4.96 4.40 4.70  HGB 14.7 13.3 14.1  HCT 43.7 38.4* 42.8  MCV 88.1 87.3 91.1  MCH 29.6 30.2 30.0  MCHC 33.6 34.6 32.9  RDW 12.9 13.1 12.9  PLT 249 232 141*    BNP Recent Labs  Lab 01/27/21 1247 01/29/21 2021  BNP 16.4 13.4     DDimer  Recent Labs  Lab 01/29/21 2021  DDIMER <0.27     Radiology    ECHOCARDIOGRAM COMPLETE  Result Date: 01/30/2021    ECHOCARDIOGRAM REPORT   [name] Date of Exam: 01/30/2021 Medical Rec #:  [id number]       Height:       76.0 in Accession #:    [id number]      Weight:       252.0 lb Date of Birth:  01-Jul-1951       BSA:          2.444 m Patient Age:    70 years        BP:           141/94 mmHg Patient Gender: M               HR:           86 bpm. Exam Location:  Inpatient Procedure: 2D Echo and Intracardiac Opacification Agent Indications:    CHF-Acute Diastolic V37.10  History:        Patient has prior history of  Echocardiogram examinations, most                 recent 08/29/2019. Risk Factors:Hypertension and Diabetes.  Sonographer:    Mikki Santee RDCS (AE) Referring Phys: [id number] [facility]  Sonographer Comments: Technically difficult study due to poor echo windows, Technically challenging study due to limited acoustic windows, suboptimal subcostal window, suboptimal apical window and suboptimal parasternal window. IMPRESSIONS  1. Large pericardial effusion. The pericardial effusion is anterior to the right ventricle. There is no evidence of increased pericardial pressure. Echodensity within the effusion may suggest exudative nature.  2. Left ventricular ejection fraction, by estimation, is 55 to 60%. The left ventricle has normal function. Left ventricular endocardial border not optimally defined to evaluate regional wall motion. There is moderate concentric left ventricular hypertrophy. Left ventricular diastolic parameters are indeterminate.  3. Right ventricular systolic function was not well visualized. The right ventricular size is not well visualized.  4. The mitral valve was not well visualized. No evidence of mitral valve regurgitation.  5. The aortic valve was not well visualized. Aortic valve regurgitation is not visualized. No aortic stenosis is present.  6. Aortic dilatation noted. There is mild dilatation of the aortic root, measuring 40 mm.  7. The inferior vena cava is normal in size with <50% respiratory variability, suggesting right atrial pressure of 8 mmHg. Comparison(s): A prior study was performed on 08/29/2019.  Prior images reviewed side by side. Difficult comparsion- this study is much more technically difficult. Significant increase in pericardial effusion. Primary cardiology team made aware. FINDINGS  Left Ventricle: Left ventricular ejection fraction, by estimation, is 55 to 60%. The left ventricle has normal function. Left ventricular endocardial border not optimally defined to  evaluate regional wall motion. Definity contrast agent was given IV to delineate the left ventricular endocardial borders. The left ventricular internal cavity size was normal in size. There is moderate concentric left ventricular hypertrophy. Left ventricular diastolic parameters are indeterminate. Right Ventricle: The right ventricular size is not well visualized. Right vetricular wall thickness was not well visualized. Right ventricular systolic function was not well visualized. Left Atrium: Left atrial size was normal in size. Right Atrium: Right atrial size was normal in size. Pericardium: A large pericardial effusion is present. The pericardial effusion is anterior to the right ventricle. Mitral Valve: The mitral valve was not well visualized. No evidence of mitral valve regurgitation. Tricuspid Valve: The tricuspid valve is not well visualized. Tricuspid valve regurgitation is not demonstrated. No evidence of tricuspid stenosis. Aortic Valve: The aortic valve was not well visualized. Aortic valve regurgitation is not visualized. No aortic stenosis is present. Pulmonic Valve: The pulmonic valve was not well visualized. Pulmonic valve regurgitation is not visualized. No evidence of pulmonic stenosis. Aorta: Aortic dilatation noted. There is mild dilatation of the aortic root, measuring 40 mm. Venous: The inferior vena cava is normal in size with less than 50% respiratory variability, suggesting right atrial pressure of 8 mmHg. IAS/Shunts: The atrial septum is grossly normal.  LEFT VENTRICLE PLAX 2D LVIDd:         5.90 cm LVIDs:         4.80 cm LV PW:         1.30 cm LV IVS:        1.40 cm LVOT diam:     2.80 cm LV SV:         70 LV SV Index:   28 LVOT Area:     6.16 cm  RIGHT VENTRICLE RV S prime:     17.90 cm/s TAPSE (M-mode): 2.5 cm LEFT ATRIUM           Index      RIGHT ATRIUM           Index LA diam:      5.00 cm 2.05 cm/m RA Area:     18.30 cm LA Vol (A4C): 20.4 ml 8.35 ml/m RA Volume:   50.50 ml   20.66 ml/m  AORTIC VALVE LVOT Vmax:   74.10 cm/s LVOT Vmean:  47.600 cm/s LVOT VTI:    0.113 m  AORTA Ao Root diam: 3.60 cm  SHUNTS Systemic VTI:  0.11 m Systemic Diam: 2.80 cm Rudean Haskell MD Electronically signed by Rudean Haskell MD Signature Date/Time: 01/30/2021/3:00:35 PM    Final     Cardiac Studies   1.5 cm moderate pericardial effusion.  No tamponade normal EF  Patient Profile     70 y.o. male with newly discovered pericardial effusion.  Shortness of breath.  Hypertrophic cardiomyopathy  Assessment & Plan    -Colchicine started as anti-inflammatory for pericardial effusion.  Tolerating well.  No changes. -Holding Eliquis.  2 years ago, subclavian artery thrombosis noted.  This was continued after this date.  No prior evidence of atrial fibrillation.  I think would be wise to wait on resuming Eliquis until repeat echocardiogram as outpatient is performed to make sure  that pericardial effusion is stable. -Discussed case with interventional cardiology team member, Dr. Claiborne Billings who looked at echocardiogram images.  We do not feel that there is an adequate window for tapping, nor is there any impending need for fluid removal. -Continue with conservative management. -Creatinine from 1.53, 1.4, 1.62, 1.55.  -Heart rate improved with the addition of metoprolol tartrate 25 mg twice a day given hypertrophic cardiomyopathy.   Stable from cardiac standpoint for discharge. We will set up close follow-up with Dr. Gardiner Rhyme. We will try to repeat echocardiogram as outpatient in 1 week.      For questions or updates, please contact [place] Please consult [email or web address] for contact info under        Signed, Candee Furbish, MD  02/01/2021, 12:19 PM

## 2021-02-01 NOTE — TOC Initial Note (Signed)
Transition of Care [facility]) - Initial/Assessment Note    Patient Details  Name: Joshua [name] MRN: [id number] Date of Birth: Aug 22, 1951  Transition of Care [facility]) CM/SW Contact:    Bartholomew Crews, RN Phone Number: [phone] 02/01/2021, 3:27 PM  Clinical Narrative:                  Spoke with patient and wife at bedside. Discussed transition needs. Agreeable to outpatient PT. Referral placed to [facility]. Location - see AVS. Patient already has a walker at home.  Patient on RA. Noted oxygen saturation drop prior to admission while at PCP office. Will follow for walking ambulation note and arrange home oxygen if qualifies.   TOC following for transition needs.   Expected Discharge Plan: OP Rehab Barriers to Discharge: Continued Medical Work up   Patient Goals and CMS Choice Patient states their goals for this hospitalization and ongoing recovery are:: home with wife CMS Medicare.gov Compare Post Acute Care list provided to:: Patient Choice offered to / list presented to : Patient  Expected Discharge Plan and Services Expected Discharge Plan: OP Rehab In-house Referral: NA Discharge Planning Services: CM Consult Post Acute Care Choice: NA (outpatient rehab - [name]) Living arrangements for the past 2 months: Single Family Home                 DME Arranged: N/A DME Agency: NA       HH Arranged: NA HH Agency: NA        Prior Living Arrangements/Services Living arrangements for the past 2 months: Single Family Home Lives with:: Self,Spouse Patient language and need for interpreter reviewed:: Yes Do you feel safe going back to the place where you live?: Yes      Need for Family Participation in Patient Care: No (Comment)   Current home services: DME (walker) Criminal Activity/Legal Involvement Pertinent to Current Situation/Hospitalization: No - Comment as needed  Activities of Daily Living Home Assistive Devices/Equipment: CPAP ADL Screening (condition at time of  admission) Patient's cognitive ability adequate to safely complete daily activities?: Yes Is the patient deaf or have difficulty hearing?: No Does the patient have difficulty seeing, even when wearing glasses/contacts?: No Does the patient have difficulty concentrating, remembering, or making decisions?: No Patient able to express need for assistance with ADLs?: Yes Does the patient have difficulty dressing or bathing?: No Independently performs ADLs?: Yes (appropriate for developmental age) Does the patient have difficulty walking or climbing stairs?: Yes Weakness of Legs: Both Weakness of Arms/Hands: None  Permission Sought/Granted Permission sought to share information with : Family Supports Permission granted to share information with : Yes, Verbal Permission Granted  Share Information with NAME: Joshua [name]     Permission granted to share info w Relationship: wife  Permission granted to share info w Contact Information: [phone]  Emotional Assessment Appearance:: Appears stated age Attitude/Demeanor/Rapport: Engaged Affect (typically observed): Accepting Orientation: : Oriented to Self,Oriented to  Time,Oriented to Place,Oriented to Situation Alcohol / Substance Use: Not Applicable Psych Involvement: No (comment)  Admission diagnosis:  DOE (dyspnea on exertion) [R06.00] Generalized weakness [R53.1] Acute hypoxemic respiratory failure ([place]) [J96.01] Patient Active Problem List   Diagnosis Date Noted  . DOE (dyspnea on exertion) 01/30/2021  . Acute hypoxemic respiratory failure ([place]) 01/29/2021  . Bilateral hearing loss 05/15/2020  . Bilateral impacted cerumen 05/15/2020  . Fungal otitis externa 05/15/2020  . Cerebral embolism with cerebral infarction 08/29/2019  . 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[place]) 08/28/2019  . Hyperlipidemia 08/28/2019  .  Chronic kidney disease 08/28/2019  . Obesity (BMI 30-39.9) 08/28/2019  . Tremor 07/07/2019  . Essential tremor 06/22/2017  .  OSA on CPAP 04/10/2017  . Essential hypertension 04/10/2017  . Pulmonary thromboembolism ([place]) 04/10/2017  . PE (pulmonary thromboembolism) ([place]) 04/09/2017  . S/P left TKA 03/30/2017  . S/P total knee replacement 03/30/2017  . Cellulitis of left foot 03/07/2016  . Pre-ulcerative calluses 03/07/2016  . Type 2 diabetes mellitus with left diabetic foot ulcer ([place]) 01/14/2016  . Diabetes mellitus type 2 in obese ([place]) 08/07/2008  . GERD 08/07/2008   PCP:  [name], MD Pharmacy:   [facility], [place] [address] Phone: [phone] Fax: [place], [place] [address] [place] [address] Phone: [phone] Fax: [phone]     Social Determinants of Health (SDOH) Interventions    Readmission Risk Interventions No flowsheet data found.

## 2021-02-01 NOTE — Progress Notes (Signed)
Inpatient Diabetes Program Recommendations  AACE/ADA: New Consensus Statement on Inpatient Glycemic Control (2015)  Target Ranges:  Prepandial:   less than 140 mg/dL      Peak postprandial:   less than 180 mg/dL (1-2 hours)      Critically ill patients:  140 - 180 mg/dL   Lab Results  Component Value Date   GLUCAP 86 02/01/2021   HGBA1C 8.9 (H) 01/30/2021    Review of Glycemic Control Results for Joshua [name], Joshua [name] (MRN [id number]) as of 02/01/2021 11:21  Ref. Range 01/31/2021 07:03 01/31/2021 07:44 01/31/2021 11:30 01/31/2021 16:52 01/31/2021 19:41 02/01/2021 06:57 02/01/2021 07:36  Glucose-Capillary Latest Ref Range: 70 - 99 mg/dL 62 (L) 112 (H) 164 (H) 183 (H) 149 (H) 51 (L) 86   Diabetes history: DM2 Outpatient Diabetes medications: Toujeo 85 units BID, Humalog 15-25-25 TID, metformin 1000 mg BID Current orders for Inpatient glycemic control: Lantus 85 units BID, Novolog 0-15 units TID with meals and 0-5 HS  Inpatient Diabetes Program Recommendations:    Hypoglycemia the past 2 mornings  - reduce pm dose of Lantus to 78 units  Thanks,  Tama Headings RN, MSN, BC-ADM Inpatient Diabetes Coordinator Team Pager [phone] (8a-5p)

## 2021-02-01 NOTE — Progress Notes (Signed)
Occupational Therapy Treatment Patient Details Name: Joshua [name] MRN: [id number] DOB: [birth year] Today's Date: 02/01/2021    History of present illness 70yo male admitted 01/29/21 with c/o generalized weakness and fatigue, DOE and found to be hypoxic on room air. Admitted with acute hypoxemic respiratory failure. PMH BPPV, CVA, DM, HLD, gout, HTN, optic neuropathy, essential tremor, DBS, TKA   OT comments  Patient/spouse provided energy conservation and fall prevention handout. Discussed with patient during functional ambulation, able to verbalize understanding of strategies and reports he plans to use walker at home now to maximize stability during ambulation. Patient supervision level for functional ambulation including to/from bathroom, for clothing management and hand hygiene at sink. Denied any dizziness during treatment session. Continue with POC   Follow Up Recommendations  No OT follow up;Supervision - Intermittent    Equipment Recommendations  None recommended by OT       Precautions / Restrictions Precautions Precautions: Fall;Other (comment) Precaution Comments: monitor O2, SOB, hx of vertigo       Mobility Bed Mobility Overal bed mobility: Modified Independent                  Transfers Overall transfer level: Needs assistance Equipment used: Rolling walker (2 wheeled) Transfers: Sit to/from Stand Sit to Stand: Supervision         General transfer comment: no physical assistance to power up to standing    Balance Overall balance assessment: Needs assistance;History of Falls Sitting-balance support: No upper extremity supported;Feet supported Sitting balance-Leahy Scale: Good     Standing balance support: No upper extremity supported;During functional activity Standing balance-Leahy Scale: Fair Standing balance comment: benefits from BUE support                           ADL either performed or assessed with clinical judgement   ADL  Overall ADL's : Needs assistance/impaired     Grooming: Wash/dry hands;Supervision/safety;Standing                   Toilet Transfer: Supervision/safety;Ambulation Toilet Transfer Details (indicate cue type and reason): no loss of balance noted ambulating to/from bathroom Toileting- Clothing Manipulation and Hygiene: Supervision/safety;Sit to/from stand Toileting - Clothing Manipulation Details (indicate cue type and reason): patient able to manage clothing in standing with no loss of balance noted     Functional mobility during ADLs: Supervision/safety;Rolling walker General ADL Comments: provided patient energy conservation and fall prevention hand out this session. Discuss strategies with patient re: fall prevention at home patient verbalize understanding and states he will be using walker at home from now on vs no AD               Cognition Arousal/Alertness: Awake/alert Behavior During Therapy: WFL for tasks assessed/performed Overall Cognitive Status: Within Functional Limits for tasks assessed                                                General Comments HR 72 with functional ambulation    Pertinent Vitals/ Pain       Pain Assessment: No/denies pain         Frequency  Min 2X/week        Progress Toward Goals  OT Goals(current goals can now be found in the care plan section)  Progress towards OT goals: Progressing toward  goals  Acute Rehab OT Goals Patient Stated Goal: home soon OT Goal Formulation: With patient Time For Goal Achievement: 02/13/21 Potential to Achieve Goals: Good ADL Goals Pt Will Perform Grooming: Independently;standing Pt Will Perform Lower Body Dressing: Independently;sitting/lateral leans;sit to/from stand Pt Will Transfer to Toilet: Independently;ambulating Additional ADL Goal #1: Pt to verbalize at least 2 fall preventions to maximize safety at home Additional ADL Goal #2: Pt to increase activity  tolerance to > 7 minutes standing during functional tasks to improve overall endurance Additional ADL Goal #3: Pt to verbalize at least 2 energy conservation strategies to implement during ADLs/IADLs  Plan Discharge plan remains appropriate       AM-PAC OT "6 Clicks" Daily Activity     Outcome Measure   Help from another person eating meals?: None Help from another person taking care of personal grooming?: A Little Help from another person toileting, which includes using toliet, bedpan, or urinal?: A Little Help from another person bathing (including washing, rinsing, drying)?: A Little Help from another person to put on and taking off regular upper body clothing?: A Little Help from another person to put on and taking off regular lower body clothing?: A Little 6 Click Score: 19    End of Session Equipment Utilized During Treatment: Rolling walker  OT Visit Diagnosis: Unsteadiness on feet (R26.81);Other abnormalities of gait and mobility (R26.89);History of falling (Z91.81)   Activity Tolerance Patient tolerated treatment well   Patient Left with call bell/phone within reach;in chair   Nurse Communication Mobility status        Time: 8295-6213 OT Time Calculation (min): 24 min  Charges: OT General Charges $OT Visit: 1 Visit OT Treatments $Self Care/Home Management : 23-37 mins  Delbert Phenix OT OT pager: [place] 02/01/2021, 1:59 PM

## 2021-02-01 NOTE — Plan of Care (Signed)

## 2021-02-01 NOTE — Progress Notes (Addendum)
Hypoglycemic Event  CBG: 51 at 0657  Treatment: 4 oz juice/soda and graham crackers  Symptoms: sleepy  Follow-up CBG: Time:0736  CBG Result:86   Possible Reasons for Event: Medication regimen: Lantus 81 units 2x day  Comments/MD notified: hypoglycemic protocol started, MD notified and morning dose of lantus is to be held.    Vira Agar

## 2021-02-02 DIAGNOSIS — J9601 Acute respiratory failure with hypoxia: Secondary | ICD-10-CM | POA: Diagnosis not present

## 2021-02-02 LAB — BASIC METABOLIC PANEL
Anion gap: 7 (ref 5–15)
BUN: 34 mg/dL — ABNORMAL HIGH (ref 8–23)
CO2: 25 mmol/L (ref 22–32)
Calcium: 8.7 mg/dL — ABNORMAL LOW (ref 8.9–10.3)
Chloride: 106 mmol/L (ref 98–111)
Creatinine, Ser: 1.68 mg/dL — ABNORMAL HIGH (ref 0.61–1.24)
GFR, Estimated: 43 mL/min — ABNORMAL LOW (ref >60)
Glucose, Bld: 305 mg/dL — ABNORMAL HIGH (ref 70–99)
Potassium: 4 mmol/L (ref 3.5–5.1)
Sodium: 138 mmol/L (ref 135–145)

## 2021-02-02 LAB — GLUCOSE, CAPILLARY
Glucose-Capillary: 200 mg/dL — ABNORMAL HIGH (ref 70–99)
Glucose-Capillary: 214 mg/dL — ABNORMAL HIGH (ref 70–99)

## 2021-02-02 MED ORDER — METOPROLOL TARTRATE 25 MG PO TABS: 25.0000 mg | ORAL_TABLET | Freq: Two times a day (BID) | ORAL | 1 refills | Status: DC

## 2021-02-02 MED ORDER — COLCHICINE 0.6 MG PO TABS: 0.6000 mg | ORAL_TABLET | Freq: Two times a day (BID) | ORAL | 0 refills | Status: DC

## 2021-02-02 NOTE — Progress Notes (Signed)
Physical Therapy Treatment Patient Details Name: Joshua [name] MRN: [id number] DOB: 06/23/1951 Today's Date: 02/02/2021    History of Present Illness 70yo male admitted 01/29/21 with c/o generalized weakness and fatigue, DOE and found to be hypoxic on room air. Admitted with acute hypoxemic respiratory failure. PMH BPPV, CVA, DM, HLD, gout, HTN, optic neuropathy, essential tremor, DBS, TKA    PT Comments    Patient continues to progress towards goals. I have updated recommendations for HHPT at discharge rather an OPPT. Pt lost his balance a couple of times and relies on furniture and the wall when walking (even with his rolling walker.) He agrees with my concern that it may not be safe yet to walk outdoors to his car and outpatient rehab just yet. I anticipate he will progress quickly but needs some additional training to optimize safety with gait at home. No DOE on exertion and he reports feeling much better other than being off balance. Will continue to follow until d/c.   Follow Up Recommendations  Home health PT;Other (comment)     Equipment Recommendations  Rolling walker with 5" wheels    Recommendations for Other Services       Precautions / Restrictions Precautions Precautions: Fall;Other (comment) Precaution Comments: monitor O2, SOB, hx of vertigo Restrictions Weight Bearing Restrictions: No    Mobility  Bed Mobility Overal bed mobility: Modified Independent                  Transfers Overall transfer level: Modified independent                  Ambulation/Gait Ambulation/Gait assistance: Min guard Gait Distance (Feet): 275 Feet Assistive device: Rolling walker (2 wheeled);None Gait Pattern/deviations: Step-through pattern;Decreased stride length;Staggering left Gait velocity: decreased   General Gait Details: Initially walking in room without device holding onto furniture when PT entered room. A bit unsteady but able to self correct. Practiced  ambulating in hallway with RW. Pt had two episodes of LOB requring cues for appropriate RW use, and additonal episode where he had to hold onto a door frame. Close guard for safety throughout with cues for awareness and DME use with RW.   Stairs             Wheelchair Mobility    Modified Rankin (Stroke Patients Only)       Balance Overall balance assessment: Needs assistance;History of Falls Sitting-balance support: No upper extremity supported;Feet supported Sitting balance-Leahy Scale: Good     Standing balance support: No upper extremity supported;During functional activity Standing balance-Leahy Scale: Fair Standing balance comment: benefits from BUE support                            Cognition Arousal/Alertness: Awake/alert Behavior During Therapy: WFL for tasks assessed/performed Overall Cognitive Status: Within Functional Limits for tasks assessed                                        Exercises      General Comments General comments (skin integrity, edema, etc.): No DOE      Pertinent Vitals/Pain Pain Assessment: No/denies pain    Home Living                      Prior Function            PT  Goals (current goals can now be found in the care plan section) Acute Rehab PT Goals Patient Stated Goal: home soon PT Goal Formulation: With patient Time For Goal Achievement: 02/14/21 Potential to Achieve Goals: Good Progress towards PT goals: Progressing toward goals    Frequency    Min 3X/week      PT Plan Discharge plan needs to be updated    Co-evaluation              AM-PAC PT "6 Clicks" Mobility   Outcome Measure  Help needed turning from your back to your side while in a flat bed without using bedrails?: None Help needed moving from lying on your back to sitting on the side of a flat bed without using bedrails?: None Help needed moving to and from a bed to a chair (including a wheelchair)?: A  Little Help needed standing up from a chair using your arms (e.g., wheelchair or bedside chair)?: A Little Help needed to walk in hospital room?: A Little Help needed climbing 3-5 steps with a railing? : A Little 6 Click Score: 20    End of Session Equipment Utilized During Treatment: Gait belt Activity Tolerance: Patient tolerated treatment well Patient left: in chair;with call bell/phone within reach   PT Visit Diagnosis: Muscle weakness (generalized) (M62.81);History of falling (Z91.81);Difficulty in walking, not elsewhere classified (R26.2)     Time: 9842-1031 PT Time Calculation (min) (ACUTE ONLY): 16 min  Charges:  $Gait Training: 8-22 mins                     [employer], PT    Ellouise Newer 02/02/2021, 12:40 PM

## 2021-02-02 NOTE — Discharge Summary (Signed)
Physician Discharge Summary  KREIG PARSON BZJ:[id number] DOB: [birth year] DOA: 01/29/2021  PCP: [name], MD  Admit date: 01/29/2021   Discharge date: 02/02/2021  Admitted From: Home.  Disposition:  Home.  Recommendations for Outpatient Follow-up:  1. Follow up with PCP in 1-2 weeks. 2. Please obtain BMP/CBC in one week.  3. Advised to follow-up with cardiology Dr. Gilman Schmidt in 1 week.   4. Advised to repeat echocardiogram in 1 week.   5. Advised to take colchicine 0.6 mg twice daily for pericardial effusion.  Home Health: None. Equipment/Devices: None.  Discharge Condition: Stable CODE STATUS:Full code Diet recommendation: Heart Healthy   Brief [facility] Course: This 70 years old male with PMH significant for hypertrophic cardiomyopathy, hypertension, type 2 diabetes, OSA on CPAP, CKD stage IIIa, PE after knee surgery, essential tremor, s/p deep brain stimulator in 0175, cardioembolic CVA on Eliquis presenting with a chief complaint of generalized weakness and severe fatigue.  Patient has seen cardiology 5 days ago for progressive dyspnea on exertion which was felt to represent anginal equivalent. Patient was scheduled for outpatient stress test to evaluate for ischemia.  He went to see his primary care physician and was told to go to emergency department because of hypoxia,  his O2 saturation dropped to 86% with ambulation.  CT head negative for any intracranial abnormality, CT C-spine negative. Patient was admitted for acute hypoxic respiratory failure possibly secondary to hypertrophic cardiomyopathy.  Cardiology consulted. Patient might need cardiac work-up during hospitalization.  Echocardiogram shows large pericardial effusion.  There were no signs of cardiac tamponade.  Patient was started on colchicine 0.6 mg twice daily.  Interventional radiologist and cardiologist has reviewed the echocardiogram.  It seems like there is not adequate window for tapping, nor there is any  impending need for fluid removal.  Patient was cleared from cardiology to be discharged and follow-up in 1 week for repeat echocardiogram.  Patient will remain off of Eliquis until seen by cardiology in 1 week.  Patient has ambulated well without any chest pain,  difficulty breathing.  his O2 saturation remained stable.  PT recommended outpatient PT,  Patient is being discharged home.  He was managed for below problems.   Discharge Diagnoses:  Principal Problem:   Acute hypoxemic respiratory failure (HCC) Active Problems:   Essential hypertension   Hyperlipidemia   Chronic kidney disease   DOE (dyspnea on exertion)  Acute hypoxemic respiratory failure: > Improved. Patient reports progressive shortness of breath on exertion. He has seen his cardiologist recently and was scheduled to have outpatient stress test. His sats dropped to 84% with ambulation at PCPs office.  At present, not hypoxemic and no signs of respiratory distress.  Covid test negative,PE less likely as he is not tachycardic.  He is on chronic anticoagulation with Eliquis and reports compliance.  His D-dimer is negative. He has a history of hypertrophic cardiomyopathy.  He was recently seen by cardiology for progressive dyspnea on exertion and symptoms were felt to represent anginal equivalent.  At present, he is not endorsing any chest pain. Initial high-sensitivity troponin negative, EKG without acute changes. Continuous pulse ox, supplemental oxygen as needed. TTE shows large pericardial effusion but no signs of tamponade. Cardiology consulted, started on colchicine.  Eliquis is on hold. He is saturating 96% on room air.  Large pericardial effusion: Patient remains asymptomatic, lying flat on the bed. Echocardiogram showed large pericardial effusion.  No signs of cardiac tamponade. Started on colchicine, Eliquis is on hold. Interventional radiologist and  cardiologist discussed. It seems like there isn't  adequate window for tapping , nor there is any impending need for fluid removal.   Continue with conservative management. Cardiologist states patient is stable from cardio perspective for discharge and will schedule outpatient repeat echo in one week.  Generalized weakness / Falls No fever or significant leukocytosis on labs.  Chest x-ray not suggestive of pneumonia. UA without signs of infection. Head CT negative for acute finding. Suspect weakness is due to cardiac etiology. Work-up as mentioned above.  TSH normal. PT/OT eval, fall precautions.  Right eye pain: Resolved. Patient states he had some mild dull pain behind his right eye for 2 days which has now resolved. GCAless likely as he denies blurry vision or change in his vision. Not endorsing any symptoms of jaw claudication. Afebrile. ESR and CRP slightly elevated.  Asymmetric lower extremity edema:  Patient noted to have mild pitting edema of the left lower extremity. No erythema or warmth to touch. DVT less likely as he was on Eliquis and reports compliance. D-dimer negative. He was previously on hydrochlorothiazide which was stoppedby cardiologydue to complaintof lightheadedness which was felt to be due to diuretic use in the setting of hypertrophic cardiomyopathy.Currently on Lasix 40 mg daily. Continue home Lasix  Hypertension:  -Continue amlodipine, Lasix, and losartan  Insulin-dependent type 2 diabetes: A1c 7.8 in 10/2020 per note from PCPs office. Hold home Metformin. Continue home long-acting insulin.  Continue sliding scale insulin moderate ACHS.  OSA Continue CPAP at night  History of cardioembolic CVA Hold Eliquis, Continue statin  CKD stage IIIa:Creatinine 1.5, at baseline. Continue to monitor  Hyperlipidemia Continue Lipitor, fenofibrate  Discharge Instructions  Discharge Instructions    Ambulatory referral to Physical Therapy   Complete by: As directed    Call MD  for:  difficulty breathing, headache or visual disturbances   Complete by: As directed    Call MD for:  persistant dizziness or light-headedness   Complete by: As directed    Call MD for:  persistant nausea and vomiting   Complete by: As directed    Call MD for:  severe uncontrolled pain   Complete by: As directed    Diet - low sodium heart healthy   Complete by: As directed    Diet Carb Modified   Complete by: As directed    Discharge instructions   Complete by: As directed    Advised to follow-up with primary care physician in 1 week.   Advised to follow-up with cardiology Dr. Nechama Guard in 1 week.   Advised to repeat echocardiogram in 1 week.   Advised to take colchicine 0.6 mg twice daily for pericardial effusion.   Increase activity slowly   Complete by: As directed      Allergies as of 02/02/2021      Reactions   Melatonin Other (See Comments)   dizzy      Medication List    STOP taking these medications   Eliquis 5 MG Tabs tablet Generic drug: apixaban   losartan 100 MG tablet Commonly known as: COZAAR   metFORMIN 1000 MG tablet Commonly known as: GLUCOPHAGE     TAKE these medications   amLODipine 10 MG tablet Commonly known as: NORVASC Take 1 tablet (10 mg total) by mouth at bedtime.   atorvastatin 80 MG tablet Commonly known as: LIPITOR Take 1 tablet (80 mg total) by mouth daily at 6 PM.   colchicine 0.6 MG tablet Take 1 tablet (0.6 mg total) by mouth 2 (  two) times daily.   DULoxetine 60 MG capsule Commonly known as: CYMBALTA Take 60 mg by mouth daily.   fenofibrate 54 MG tablet Take 54 mg by mouth daily.   Fish Oil 1000 MG Caps Take 2,000 mg by mouth daily. Reported on 02/13/2016   furosemide 40 MG tablet Commonly known as: LASIX Take 40 mg by mouth daily.   insulin lispro 100 UNIT/ML KwikPen Commonly known as: HUMALOG Inject 15-25 Units into the skin See admin instructions. Inject 15 units subcutaneously after breakfast and 25 units after  lunch and supper   Lutein-Zeaxanthin 25-5 MG Caps Take 1 tablet by mouth daily.   metoprolol tartrate 25 MG tablet Commonly known as: LOPRESSOR Take 1 tablet (25 mg total) by mouth 2 (two) times daily.   mirtazapine 7.5 MG tablet Commonly known as: REMERON Take 7.5 mg by mouth at bedtime.   multivitamin with minerals Tabs tablet Take 1 tablet by mouth daily.   OneTouch Verio test strip Generic drug: glucose blood   PRESCRIPTION MEDICATION Inhale into the lungs at bedtime. CPAP   Sure Comfort Pen Needles 31G X 5 MM Misc Generic drug: Insulin Pen Needle   SYSTANE OP Place 1-2 drops into both eyes 4 (four) times daily as needed (dry eyes).   Toujeo SoloStar 300 UNIT/ML Solostar Pen Generic drug: insulin glargine (1 Unit Dial) Inject 85 Units into the skin 2 (two) times daily.       Follow-up Information    [name], MD Follow up in 1 week(s).   Specialty: Internal Medicine Contact information: [place] [address] [phone]        Donato Heinz, MD .   Specialties: Cardiology, Radiology Contact information: [address] [place] [address] [place] [address] [phone]              Allergies  Allergen Reactions  . Melatonin Other (See Comments)    dizzy    Consultations: Cardiology  Procedures/Studies: DG Chest 2 View  Result Date: 01/29/2021 CLINICAL DATA:  Hypoxia. EXAM: CHEST - 2 VIEW COMPARISON:  Chest x-ray dated January 27, 2021. FINDINGS: The heart size and mediastinal contours are within normal limits. Normal pulmonary vascularity. Low lung volumes with mild bibasilar atelectasis. No focal consolidation, pleural effusion, or pneumothorax. No acute osseous abnormality. Unchanged stimulator pack overlying the right chest. IMPRESSION: 1. Low lung volumes with mild bibasilar atelectasis. Electronically Signed   By: Titus Dubin M.D.   On: 01/29/2021 18:43   CT Head Wo Contrast  Result Date: 01/29/2021 CLINICAL  DATA:  Head trauma, minor (Age >= 65y) multiple falls on eliquis, progressive generalized weakness with right sided headache since sunday EXAM: CT HEAD WITHOUT CONTRAST TECHNIQUE: Contiguous axial images were obtained from the base of the skull through the vertex without intravenous contrast. COMPARISON:  Head CT 2 days ago 01/27/2021 FINDINGS: Brain: Deep brain stimulator from a left frontal approach unchanged with tip in the deep basal ganglia. No intracranial hemorrhage, mass effect, or midline shift. No hydrocephalus. The basilar cisterns are patent. Stable degree of periventricular white matter hypodensity typical of chronic small vessel ischemia. Punctate right basal ganglia lacunar infarct. No evidence of territorial infarct or acute ischemia. No extra-axial or intracranial fluid collection. Vascular: Atherosclerosis of skullbase vasculature without hyperdense vessel or abnormal calcification. Skull: No fracture or focal lesion. Sinuses/Orbits: Bilateral cataract resection. Metallic density within the inferior left orbit, unchanged. Punctate metallic density just lateral to the right orbit. Paranasal sinuses are clear. Mastoid air cells are  well aerated. Other: None. IMPRESSION: 1. No acute intracranial abnormality. No skull fracture. 2. Stable chronic small vessel ischemia. Deep brain stimulator in place. Electronically Signed   By: Keith Rake M.D.   On: 01/29/2021 20:12   CT HEAD WO CONTRAST  Result Date: 01/27/2021 CLINICAL DATA:  Altered mental status today.  Hypertension. EXAM: CT HEAD WITHOUT CONTRAST TECHNIQUE: Contiguous axial images were obtained from the base of the skull through the vertex without intravenous contrast. COMPARISON:  Head CT scan 12/15/2019. FINDINGS: Brain: No evidence of acute infarction, hemorrhage, hydrocephalus, extra-axial collection or mass lesion/mass effect. Vascular: No hyperdense vessel or unexpected calcification. Skull: Intact.  No focal lesion. Sinuses/Orbits:  Status post cataract surgery. Other: Small radiopaque foreign bodies in the left orbit and lateral to the right orbit again seen. IMPRESSION: No acute abnormality.  Stable compared to prior exam. Electronically Signed   By: Inge Rise M.D.   On: 01/27/2021 13:41   CT Cervical Spine Wo Contrast  Result Date: 01/29/2021 CLINICAL DATA:  Neck trauma (Age >= 65y) EXAM: CT CERVICAL SPINE WITHOUT CONTRAST TECHNIQUE: Multidetector CT imaging of the cervical spine was performed without intravenous contrast. Multiplanar CT image reconstructions were also generated. COMPARISON:  None. FINDINGS: Alignment: Normal. Skull base and vertebrae: No acute fracture. Dens and skull base are intact. No destructive process. Soft tissues and spinal canal: No prevertebral fluid or swelling. No visible canal hematoma. Disc levels: Anterior spurring at multiple levels. Mild disc space narrowing and posterior spurring at C5-C6. Scattered facet hypertrophy. Upper chest: No acute or unexpected findings. Other: Lead from deep brain stimulator courses in the right soft tissues. IMPRESSION: Mild degenerative change in the cervical spine without acute fracture or subluxation. Electronically Signed   By: Keith Rake M.D.   On: 01/29/2021 20:16   DG Chest Portable 1 View  Result Date: 01/27/2021 CLINICAL DATA:  Chronic shortness of breath for 2 years. EXAM: PORTABLE CHEST 1 VIEW COMPARISON:  09/13/2008 FINDINGS: The cardiomediastinal silhouette is unremarkable. Stimulator pack overlying the RIGHT chest is noted. There is no evidence of focal airspace disease, pulmonary edema, suspicious pulmonary nodule/mass, pleural effusion, or pneumothorax. No acute bony abnormalities are identified. IMPRESSION: No active disease. Electronically Signed   By: Margarette Canada M.D.   On: 01/27/2021 15:30   ECHOCARDIOGRAM COMPLETE  Result Date: 01/30/2021    ECHOCARDIOGRAM REPORT   Patient Name:   [name] Date of Exam: 01/30/2021 Medical Rec #:   [id number]       Height:       76.0 in Accession #:    [id number]      Weight:       252.0 lb Date of Birth:  08/10/1951       BSA:          2.444 m Patient Age:    44 years        BP:           141/94 mmHg Patient Gender: M               HR:           86 bpm. Exam Location:  Inpatient Procedure: 2D Echo and Intracardiac Opacification Agent Indications:    CHF-Acute Diastolic T11.73  History:        Patient has prior history of Echocardiogram examinations, most                 recent 08/29/2019. Risk Factors:Hypertension and Diabetes.  Sonographer:  Mikki Santee RDCS (AE) Referring Phys: [id number] Shela Leff  Sonographer Comments: Technically difficult study due to poor echo windows, Technically challenging study due to limited acoustic windows, suboptimal subcostal window, suboptimal apical window and suboptimal parasternal window. IMPRESSIONS  1. Large pericardial effusion. The pericardial effusion is anterior to the right ventricle. There is no evidence of increased pericardial pressure. Echodensity within the effusion may suggest exudative nature.  2. Left ventricular ejection fraction, by estimation, is 55 to 60%. The left ventricle has normal function. Left ventricular endocardial border not optimally defined to evaluate regional wall motion. There is moderate concentric left ventricular hypertrophy. Left ventricular diastolic parameters are indeterminate.  3. Right ventricular systolic function was not well visualized. The right ventricular size is not well visualized.  4. The mitral valve was not well visualized. No evidence of mitral valve regurgitation.  5. The aortic valve was not well visualized. Aortic valve regurgitation is not visualized. No aortic stenosis is present.  6. Aortic dilatation noted. There is mild dilatation of the aortic root, measuring 40 mm.  7. The inferior vena cava is normal in size with <50% respiratory variability, suggesting right atrial pressure of 8 mmHg.  Comparison(s): A prior study was performed on 08/29/2019. Prior images reviewed side by side. Difficult comparsion- this study is much more technically difficult. Significant increase in pericardial effusion. Primary cardiology team made aware. FINDINGS  Left Ventricle: Left ventricular ejection fraction, by estimation, is 55 to 60%. The left ventricle has normal function. Left ventricular endocardial border not optimally defined to evaluate regional wall motion. Definity contrast agent was given IV to delineate the left ventricular endocardial borders. The left ventricular internal cavity size was normal in size. There is moderate concentric left ventricular hypertrophy. Left ventricular diastolic parameters are indeterminate. Right Ventricle: The right ventricular size is not well visualized. Right vetricular wall thickness was not well visualized. Right ventricular systolic function was not well visualized. Left Atrium: Left atrial size was normal in size. Right Atrium: Right atrial size was normal in size. Pericardium: A large pericardial effusion is present. The pericardial effusion is anterior to the right ventricle. Mitral Valve: The mitral valve was not well visualized. No evidence of mitral valve regurgitation. Tricuspid Valve: The tricuspid valve is not well visualized. Tricuspid valve regurgitation is not demonstrated. No evidence of tricuspid stenosis. Aortic Valve: The aortic valve was not well visualized. Aortic valve regurgitation is not visualized. No aortic stenosis is present. Pulmonic Valve: The pulmonic valve was not well visualized. Pulmonic valve regurgitation is not visualized. No evidence of pulmonic stenosis. Aorta: Aortic dilatation noted. There is mild dilatation of the aortic root, measuring 40 mm. Venous: The inferior vena cava is normal in size with less than 50% respiratory variability, suggesting right atrial pressure of 8 mmHg. IAS/Shunts: The atrial septum is grossly normal.  LEFT  VENTRICLE PLAX 2D LVIDd:         5.90 cm LVIDs:         4.80 cm LV PW:         1.30 cm LV IVS:        1.40 cm LVOT diam:     2.80 cm LV SV:         70 LV SV Index:   28 LVOT Area:     6.16 cm  RIGHT VENTRICLE RV S prime:     17.90 cm/s TAPSE (M-mode): 2.5 cm LEFT ATRIUM           Index  RIGHT ATRIUM           Index LA diam:      5.00 cm 2.05 cm/m RA Area:     18.30 cm LA Vol (A4C): 20.4 ml 8.35 ml/m RA Volume:   50.50 ml  20.66 ml/m  AORTIC VALVE LVOT Vmax:   74.10 cm/s LVOT Vmean:  47.600 cm/s LVOT VTI:    0.113 m  AORTA Ao Root diam: 3.60 cm  SHUNTS Systemic VTI:  0.11 m Systemic Diam: 2.80 cm Rudean Haskell MD Electronically signed by Rudean Haskell MD Signature Date/Time: 01/30/2021/3:00:35 PM    Final     Echocardiogram.   Subjective: Patient was seen and examined at bedside.  Overnight events noted.  Patient has ambulated well without any difficulty breathing and chest pain.  Patient oxygen saturation remained stable.  Patient feels better and wants to be discharged.  Discharge Exam: Vitals:   02/02/21 0500 02/02/21 1040  BP: 132/73 124/79  Pulse: 79 75  Resp: 18 16  Temp: 98 F (36.7 C) 97.6 F (36.4 C)  SpO2: 98% 98%   Vitals:   02/01/21 1735 02/01/21 2030 02/02/21 0500 02/02/21 1040  BP: 130/84 132/79 132/73 124/79  Pulse: 77 81 79 75  Resp: 15 18 18 16   Temp: 98.3 F (36.8 C) 98.4 F (36.9 C) 98 F (36.7 C) 97.6 F (36.4 C)  TempSrc: Oral Oral Oral Oral  SpO2: 96% 98% 98% 98%  Weight:      Height:        General: Pt is alert, awake, not in acute distress Cardiovascular: RRR, S1/S2 +, no rubs, no gallops Respiratory: CTA bilaterally, no wheezing, no rhonchi Abdominal: Soft, NT, ND, bowel sounds + Extremities: no edema, no cyanosis    The results of significant diagnostics from this hospitalization (including imaging, microbiology, ancillary and laboratory) are listed below for reference.     Microbiology: Recent Results (from the past 240  hour(s))  SARS Coronavirus 2 by RT PCR (hospital order, performed in [facility] hospital lab)     Status: None   Collection Time: 01/27/21  3:25 PM  Result Value Ref Range Status   SARS Coronavirus 2 NEGATIVE NEGATIVE Final    Comment: (NOTE) SARS-CoV-2 target nucleic acids are NOT DETECTED.  The SARS-CoV-2 RNA is generally detectable in upper and lower respiratory specimens during the acute phase of infection. The lowest concentration of SARS-CoV-2 viral copies this assay can detect is 250 copies / mL. A negative result does not preclude SARS-CoV-2 infection and should not be used as the sole basis for treatment or other patient management decisions.  A negative result may occur with improper specimen collection / handling, submission of specimen other than nasopharyngeal swab, presence of viral mutation(s) within the areas targeted by this assay, and inadequate number of viral copies (<250 copies / mL). A negative result must be combined with clinical observations, patient history, and epidemiological information.  Fact Sheet for Patients:   StrictlyIdeas.no  Fact Sheet for Healthcare Providers: BankingDealers.co.za  This test is not yet approved or  cleared by the [place] FDA and has been authorized for detection and/or diagnosis of SARS-CoV-2 by FDA under an Emergency Use Authorization (EUA).  This EUA will remain in effect (meaning this test can be used) for the duration of the COVID-19 declaration under Section 564(b)(1) of the Act, 21 U.S.C. section 360bbb-3(b)(1), unless the authorization is terminated or revoked sooner.  Performed at [facility], [address] [place],  [place] [address]   SARS CORONAVIRUS 2 (TAT 6-24 HRS) Nasopharyngeal Nasopharyngeal Swab     Status: None   Collection Time: 01/29/21  8:21 PM   Specimen: Nasopharyngeal Swab  Result Value Ref Range Status   SARS Coronavirus 2 NEGATIVE  NEGATIVE Final    Comment: (NOTE) SARS-CoV-2 target nucleic acids are NOT DETECTED.  The SARS-CoV-2 RNA is generally detectable in upper and lower respiratory specimens during the acute phase of infection. Negative results do not preclude SARS-CoV-2 infection, do not rule out co-infections with other pathogens, and should not be used as the sole basis for treatment or other patient management decisions. Negative results must be combined with clinical observations, patient history, and epidemiological information. The expected result is Negative.  Fact Sheet for Patients: SugarRoll.be  Fact Sheet for Healthcare Providers: https://www.woods-mathews.com/  This test is not yet approved or cleared by the [place] FDA and  has been authorized for detection and/or diagnosis of SARS-CoV-2 by FDA under an Emergency Use Authorization (EUA). This EUA will remain  in effect (meaning this test can be used) for the duration of the COVID-19 declaration under Se ction 564(b)(1) of the Act, 21 U.S.C. section 360bbb-3(b)(1), unless the authorization is terminated or revoked sooner.  Performed at [facility], [place] [address] [place], [place] [address]      Labs: BNP (last 3 results) Recent Labs    01/27/21 1247 01/29/21 2021  BNP 16.4 43.1   Basic Metabolic Panel: Recent Labs  Lab 01/29/21 1817 01/30/21 0437 01/30/21 1834 01/31/21 0305 02/01/21 0408 02/02/21 0259  NA 137 139  --  139 140 138  K 3.8 3.4*  --  3.7 3.9 4.0  CL 101 104  --  107 107 106  CO2 26 26  --  25 24 25   GLUCOSE 155* 94  --  119* 72 305*  BUN 27* 23  --  26* 30* 34*  CREATININE 1.53* 1.40*  --  1.62* 1.55* 1.68*  CALCIUM 9.6 9.4  --  8.7* 8.8* 8.7*  MG  --   --  1.7 1.9 1.8  --   PHOS  --   --   --  3.6 4.0  --    Liver Function Tests: Recent Labs  Lab 01/27/21 1247 01/29/21 2021  AST 18 18  ALT 21 19  ALKPHOS 91 86  BILITOT 0.5 0.6  PROT 7.8 7.1   ALBUMIN 4.3 3.6   No results for input(s): LIPASE, AMYLASE in the last 168 hours. No results for input(s): AMMONIA in the last 168 hours. CBC: Recent Labs  Lab 01/27/21 1247 01/29/21 1817 01/30/21 0437 01/31/21 0305 02/01/21 0408  WBC 8.3 11.2* 9.8 8.3 8.8  NEUTROABS 4.7  --   --   --   --   HGB 15.3 14.4 14.7 13.3 14.1  HCT 45.8 44.3 43.7 38.4* 42.8  MCV 89.6 91.2 88.1 87.3 91.1  PLT 269 251 249 232 141*   Cardiac Enzymes: Recent Labs  Lab 01/30/21 1834  CKTOTAL 63   BNP: Invalid input(s): POCBNP CBG: Recent Labs  Lab 02/01/21 0736 02/01/21 1140 02/01/21 1702 02/01/21 1940 02/02/21 0610  GLUCAP 86 244* 164* 179* 200*   D-Dimer No results for input(s): DDIMER in the last 72 hours. Hgb A1c No results for input(s): HGBA1C in the last 72 hours. Lipid Profile No results for input(s): CHOL, HDL, LDLCALC, TRIG, CHOLHDL, LDLDIRECT in the last 72 hours. Thyroid function studies No results for input(s): TSH, T4TOTAL, T3FREE, THYROIDAB  in the last 72 hours.  Invalid input(s): FREET3 Anemia work up No results for input(s): VITAMINB12, FOLATE, FERRITIN, TIBC, IRON, RETICCTPCT in the last 72 hours. Urinalysis    Component Value Date/Time   COLORURINE YELLOW 01/29/2021 1930   APPEARANCEUR CLEAR 01/29/2021 1930   LABSPEC 1.015 01/29/2021 1930   PHURINE 5.0 01/29/2021 1930   GLUCOSEU 150 (A) 01/29/2021 1930   HGBUR NEGATIVE 01/29/2021 [place] NEGATIVE 01/29/2021 1930   KETONESUR NEGATIVE 01/29/2021 1930   PROTEINUR 100 (A) 01/29/2021 1930   UROBILINOGEN 0.2 04/08/2011 0732   NITRITE NEGATIVE 01/29/2021 1930   LEUKOCYTESUR NEGATIVE 01/29/2021 1930   Sepsis Labs Invalid input(s): PROCALCITONIN,  WBC,  LACTICIDVEN Microbiology Recent Results (from the past 240 hour(s))  SARS Coronavirus 2 by RT PCR (hospital order, performed in [place] hospital lab)     Status: None   Collection Time: 01/27/21  3:25 PM  Result Value Ref Range Status   SARS  Coronavirus 2 NEGATIVE NEGATIVE Final    Comment: (NOTE) SARS-CoV-2 target nucleic acids are NOT DETECTED.  The SARS-CoV-2 RNA is generally detectable in upper and lower respiratory specimens during the acute phase of infection. The lowest concentration of SARS-CoV-2 viral copies this assay can detect is 250 copies / mL. A negative result does not preclude SARS-CoV-2 infection and should not be used as the sole basis for treatment or other patient management decisions.  A negative result may occur with improper specimen collection / handling, submission of specimen other than nasopharyngeal swab, presence of viral mutation(s) within the areas targeted by this assay, and inadequate number of viral copies (<250 copies / mL). A negative result must be combined with clinical observations, patient history, and epidemiological information.  Fact Sheet for Patients:   StrictlyIdeas.no  Fact Sheet for Healthcare Providers: BankingDealers.co.za  This test is not yet approved or  cleared by the [place] FDA and has been authorized for detection and/or diagnosis of SARS-CoV-2 by FDA under an Emergency Use Authorization (EUA).  This EUA will remain in effect (meaning this test can be used) for the duration of the COVID-19 declaration under Section 564(b)(1) of the Act, 21 U.S.C. section 360bbb-3(b)(1), unless the authorization is terminated or revoked sooner.  Performed at [name], [address] [place], [place] [address]   SARS CORONAVIRUS 2 (TAT 6-24 HRS) Nasopharyngeal Nasopharyngeal Swab     Status: None   Collection Time: 01/29/21  8:21 PM   Specimen: Nasopharyngeal Swab  Result Value Ref Range Status   SARS Coronavirus 2 NEGATIVE NEGATIVE Final    Comment: (NOTE) SARS-CoV-2 target nucleic acids are NOT DETECTED.  The SARS-CoV-2 RNA is generally detectable in upper and lower respiratory specimens during the acute  phase of infection. Negative results do not preclude SARS-CoV-2 infection, do not rule out co-infections with other pathogens, and should not be used as the sole basis for treatment or other patient management decisions. Negative results must be combined with clinical observations, patient history, and epidemiological information. The expected result is Negative.  Fact Sheet for Patients: SugarRoll.be  Fact Sheet for Healthcare Providers: https://www.woods-mathews.com/  This test is not yet approved or cleared by the [place] FDA and  has been authorized for detection and/or diagnosis of SARS-CoV-2 by FDA under an Emergency Use Authorization (EUA). This EUA will remain  in effect (meaning this test can be used) for the duration of the COVID-19 declaration under Se ction 564(b)(1) of the Act, 21 U.S.C. section 360bbb-3(b)(1), unless  the authorization is terminated or revoked sooner.  Performed at [facility], [place] [address] [place], [place] [address]      Time coordinating discharge: Over 30 minutes  SIGNED:   Shawna Clamp, MD  [facility] 02/02/2021, 10:58 AM Pager   If 7PM-7AM, please contact night-coverage [email or web address]

## 2021-02-02 NOTE — Plan of Care (Signed)

## 2021-02-02 NOTE — Care Management (Signed)
LVM with patient requesting callback. Will discuss [place] services.  Current plans are for outpatient PT which has been arranged.

## 2021-02-02 NOTE — TOC Transition Note (Signed)
Transition of Care [facility]) - CM/SW Discharge Note   Patient Details  Name: Joshua [name] MRN: [id number] Date of Birth: Oct 30, 1951  Transition of Care [facility]) CM/SW Contact:  Carles Collet, RN Phone Number: 02/02/2021, 1:48 PM   Clinical Narrative:   Pt recs came through for [facility] PT. Discussed w patient who would prefer HH, reviewed ratings. Got orders from MD. Referral placed to [facility] per patient request.       Barriers to Discharge: Continued Medical Work up   Patient Goals and CMS Choice Patient states their goals for this hospitalization and ongoing recovery are:: home with wife CMS Medicare.gov Compare Post Acute Care list provided to:: Patient Choice offered to / list presented to : Patient  Discharge Placement                       Discharge Plan and Services In-house Referral: NA Discharge Planning Services: CM Consult Post Acute Care Choice: NA (outpatient rehab - [facility])          DME Arranged: N/A DME Agency: NA       HH Arranged: PT HH Agency: [place] Date Startup: 02/02/21 Time [place]: 1941 Representative spoke with at [place]: [place] ([place]) Interventions     Readmission Risk Interventions No flowsheet data found.

## 2021-02-02 NOTE — Progress Notes (Signed)
DISCHARGE NOTE HOME Joshua [name] to be discharged Home per MD order. Discussed prescriptions and follow up appointments with the patient. Prescriptions given to patient; medication list explained in detail. Patient verbalized understanding.  Skin clean, dry and intact without evidence of skin break down, no evidence of skin tears noted. IV catheter discontinued intact. Site without signs and symptoms of complications. Dressing and pressure applied. Pt denies pain at the site currently. No complaints noted.  Patient free of lines, drains, and wounds.   An After Visit Summary (AVS) was printed and given to the patient. Patient escorted via wheelchair, and discharged home via private auto.  Vira Agar, RN

## 2021-02-02 NOTE — Discharge Instructions (Signed)
Advised to follow-up with primary care physician in 1 week.   Advised to follow-up with cardiology Dr. Nechama Guard in 1 week.   Advised to repeat echocardiogram in 1 week.   Advised to take colchicine 0.6 mg twice daily for pericardial effusion.

## 2021-02-05 DIAGNOSIS — E1121 Type 2 diabetes mellitus with diabetic nephropathy: Secondary | ICD-10-CM | POA: Diagnosis not present

## 2021-02-05 DIAGNOSIS — I129 Hypertensive chronic kidney disease with stage 1 through stage 4 chronic kidney disease, or unspecified chronic kidney disease: Secondary | ICD-10-CM | POA: Diagnosis not present

## 2021-02-05 DIAGNOSIS — I700 Atherosclerosis of aorta: Secondary | ICD-10-CM | POA: Diagnosis not present

## 2021-02-05 DIAGNOSIS — R600 Localized edema: Secondary | ICD-10-CM | POA: Diagnosis not present

## 2021-02-05 DIAGNOSIS — R0600 Dyspnea, unspecified: Secondary | ICD-10-CM | POA: Diagnosis not present

## 2021-02-05 DIAGNOSIS — R0902 Hypoxemia: Secondary | ICD-10-CM | POA: Diagnosis not present

## 2021-02-05 DIAGNOSIS — I313 Pericardial effusion (noninflammatory): Secondary | ICD-10-CM | POA: Diagnosis not present

## 2021-02-05 DIAGNOSIS — I422 Other hypertrophic cardiomyopathy: Secondary | ICD-10-CM | POA: Diagnosis not present

## 2021-02-05 DIAGNOSIS — N1831 Chronic kidney disease, stage 3a: Secondary | ICD-10-CM | POA: Diagnosis not present

## 2021-02-07 ENCOUNTER — Telehealth (HOSPITAL_COMMUNITY): Payer: Self-pay | Admitting: *Deleted

## 2021-02-07 NOTE — Telephone Encounter (Signed)
Left message on voicemail per DPR in reference to upcoming appointment scheduled on 02/11/21 at 1:15 with detailed instructions given per Myocardial Perfusion Study Information Sheet for the test. LM to arrive 15 minutes early, and that it is imperative to arrive on time for appointment to keep from having the test rescheduled. If you need to cancel or reschedule your appointment, please call the office within 24 hours of your appointment. Failure to do so may result in a cancellation of your appointment, and a $50 no show fee. Phone number given for call back for any questions.

## 2021-02-11 ENCOUNTER — Ambulatory Visit (HOSPITAL_COMMUNITY): Payer: Medicare PPO | Attending: Cardiology

## 2021-02-11 ENCOUNTER — Other Ambulatory Visit: Payer: Self-pay

## 2021-02-11 DIAGNOSIS — R0600 Dyspnea, unspecified: Secondary | ICD-10-CM | POA: Diagnosis not present

## 2021-02-11 DIAGNOSIS — R0609 Other forms of dyspnea: Secondary | ICD-10-CM

## 2021-02-11 MED ORDER — TECHNETIUM TC 99M TETROFOSMIN IV KIT
32.1000 | PACK | Freq: Once | INTRAVENOUS | Status: AC | PRN
  Administered 2021-02-11: 32.1 via INTRAVENOUS
  Filled 2021-02-11: qty 33

## 2021-02-12 ENCOUNTER — Ambulatory Visit (HOSPITAL_COMMUNITY): Payer: Medicare PPO | Attending: Cardiovascular Disease

## 2021-02-12 DIAGNOSIS — R0600 Dyspnea, unspecified: Secondary | ICD-10-CM | POA: Insufficient documentation

## 2021-02-12 LAB — MYOCARDIAL PERFUSION IMAGING
LV dias vol: 131 mL (ref 62–150)
LV sys vol: 62 mL
Peak HR: 86 {beats}/min
Rest HR: 73 {beats}/min
SDS: 5
SRS: 0
SSS: 5
TID: 1.07

## 2021-02-12 MED ORDER — REGADENOSON 0.4 MG/5ML IV SOLN
0.4000 mg | Freq: Once | INTRAVENOUS | Status: AC
  Administered 2021-02-12: 0.4 mg via INTRAVENOUS

## 2021-02-12 MED ORDER — TECHNETIUM TC 99M TETROFOSMIN IV KIT
31.5000 | PACK | Freq: Once | INTRAVENOUS | Status: AC | PRN
  Administered 2021-02-12: 31.5 via INTRAVENOUS
  Filled 2021-02-12: qty 32

## 2021-02-21 ENCOUNTER — Ambulatory Visit (INDEPENDENT_AMBULATORY_CARE_PROVIDER_SITE_OTHER)
Admission: RE | Admit: 2021-02-21 | Discharge: 2021-02-21 | Disposition: A | Discharge status: Home / Self Care | Payer: Self-pay | Source: Ambulatory Visit | Attending: Cardiology | Admitting: Cardiology

## 2021-02-21 ENCOUNTER — Other Ambulatory Visit: Payer: Self-pay

## 2021-02-21 DIAGNOSIS — E785 Hyperlipidemia, unspecified: Secondary | ICD-10-CM

## 2021-02-26 ENCOUNTER — Other Ambulatory Visit: Payer: Self-pay | Admitting: *Deleted

## 2021-02-26 MED ORDER — EZETIMIBE 10 MG PO TABS: 10.0000 mg | ORAL_TABLET | Freq: Every day | ORAL | 3 refills | Status: DC

## 2021-03-06 ENCOUNTER — Telehealth: Payer: Self-pay | Admitting: Cardiology

## 2021-03-06 ENCOUNTER — Other Ambulatory Visit: Payer: Self-pay | Admitting: *Deleted

## 2021-03-06 DIAGNOSIS — E785 Hyperlipidemia, unspecified: Secondary | ICD-10-CM | POA: Diagnosis not present

## 2021-03-06 DIAGNOSIS — E1121 Type 2 diabetes mellitus with diabetic nephropathy: Secondary | ICD-10-CM | POA: Diagnosis not present

## 2021-03-06 DIAGNOSIS — I313 Pericardial effusion (noninflammatory): Secondary | ICD-10-CM

## 2021-03-06 DIAGNOSIS — Z794 Long term (current) use of insulin: Secondary | ICD-10-CM | POA: Diagnosis not present

## 2021-03-06 DIAGNOSIS — I129 Hypertensive chronic kidney disease with stage 1 through stage 4 chronic kidney disease, or unspecified chronic kidney disease: Secondary | ICD-10-CM | POA: Diagnosis not present

## 2021-03-06 DIAGNOSIS — N1831 Chronic kidney disease, stage 3a: Secondary | ICD-10-CM | POA: Diagnosis not present

## 2021-03-06 DIAGNOSIS — I3139 Other pericardial effusion (noninflammatory): Secondary | ICD-10-CM

## 2021-03-06 DIAGNOSIS — I748 Embolism and thrombosis of other arteries: Secondary | ICD-10-CM | POA: Diagnosis not present

## 2021-03-06 NOTE — Telephone Encounter (Signed)
Spoke with patient regarding the Friday 03/08/21 8:00am Echo at [facility]---arrival time is 7:45 am--1st floor admissions for check in.  Patient voiced his understanding.

## 2021-03-08 ENCOUNTER — Ambulatory Visit (HOSPITAL_COMMUNITY)
Admission: RE | Admit: 2021-03-08 | Discharge: 2021-03-08 | Disposition: A | Discharge status: Home / Self Care | Payer: Medicare PPO | Source: Ambulatory Visit | Attending: Cardiology | Admitting: Cardiology

## 2021-03-08 ENCOUNTER — Other Ambulatory Visit: Payer: Self-pay

## 2021-03-08 DIAGNOSIS — E119 Type 2 diabetes mellitus without complications: Secondary | ICD-10-CM | POA: Diagnosis not present

## 2021-03-08 DIAGNOSIS — Z86711 Personal history of pulmonary embolism: Secondary | ICD-10-CM | POA: Insufficient documentation

## 2021-03-08 DIAGNOSIS — I119 Hypertensive heart disease without heart failure: Secondary | ICD-10-CM | POA: Diagnosis not present

## 2021-03-08 DIAGNOSIS — I313 Pericardial effusion (noninflammatory): Secondary | ICD-10-CM

## 2021-03-08 DIAGNOSIS — I3139 Other pericardial effusion (noninflammatory): Secondary | ICD-10-CM

## 2021-03-08 DIAGNOSIS — E785 Hyperlipidemia, unspecified: Secondary | ICD-10-CM | POA: Diagnosis not present

## 2021-03-08 NOTE — Progress Notes (Signed)
  Echocardiogram 2D Echocardiogram has been performed.  Joshua [name] 03/08/2021, 9:00 AM

## 2021-03-12 LAB — ECHOCARDIOGRAM COMPLETE
Area-P 1/2: 3.12 cm2
Calc EF: 60.6 %
S' Lateral: 3.1 cm
Single Plane A2C EF: 59.4 %
Single Plane A4C EF: 61.8 %

## 2021-03-13 ENCOUNTER — Other Ambulatory Visit: Payer: Self-pay | Admitting: Cardiology

## 2021-03-15 DIAGNOSIS — Z974 Presence of external hearing-aid: Secondary | ICD-10-CM | POA: Diagnosis not present

## 2021-03-15 DIAGNOSIS — B369 Superficial mycosis, unspecified: Secondary | ICD-10-CM | POA: Diagnosis not present

## 2021-03-15 DIAGNOSIS — H6243 Otitis externa in other diseases classified elsewhere, bilateral: Secondary | ICD-10-CM | POA: Diagnosis not present

## 2021-03-22 NOTE — Progress Notes (Signed)
Assessment/Plan:   1.  Essential Tremor.  -The patient is status post left VIM DBS on July 07, 2019 with [facility] device.  Intention was originally to do a bilateral VIM DBS, but the patient had a vagal episode in the operating room with associated nausea and vomiting with positive CSF, and it was decided not to attempt the other side.  Ultimately, the patients other side really has been fairly well controlled and we have decided to leave him with unilateral DBS.  -Patient did better following programming today.  He still has some proximal tremor, but not nearly as severe.  We did turn the device off, and tremor was very severe with the device off.  2.  History of cerebral infarct, October, 2020  -MRI demonstrated acute right posterior limb of the internal capsule infarct. However, stroke neurology felt that symptoms were more consistent with a posterior circulation stroke, related to thrombus in the left subclavian(although patient certainly does have mild left facial droop today).   -Patient does continue to have subclavian thrombus on December 21, 2019 CTA neck.  Patient on Eliquis.  -Patient on Lipitor.  Primary care following.  Lipitor 80 mg (40 mg at time of event).  Goal LDL less than 70.  I do not have a copy of recent labs from this year.  -Discussed importance of diet.  Goal for his A1c is less than 7.  Again, primary care is following these numbers.  I have not gotten a copy of his recent labs, which were all drawn through primary care.  -Swallowing evaluation in October, 2021 was unremarkable.  3.  Dysphagia  -Modified barium on December 06, 2019 was normal.  4.  Hypertrophic cardiomyopathy  -Following with cardiology.  Currently off of Eliquis because of recent pericardial effusion.  Effusion was not large enough to drain at the time.  He has an appointment with cardiology later today.   Subjective:   Joshua [name] was seen in follow-up in the movement disorder clinic for  essential tremor.  Records reviewed since last visit.  He did call at the end of February stating that he was having more tremor.  I asked him to adjust the device on his own, as he was given some ability to do so.  I did not hear back from him after that.  Today, he reports that he thinks that he changed programs but tremor is worse.  He increased the amplitude on the program.  He notices trouble with light objects such as Styrofoam cups and drinking water and holding a pen.  Patient was in the hospital for several days in March for hypoxic/respiratory failure due to his hypertrophic cardiomyopathy.  He had a large pericardial effusion leading to dyspnea.  He was taken off of the Eliquis (just in case it needed to be drained, although there was not an adequate window at the time).  He has an appointment with cardiology later today.  Allergies  Allergen Reactions  . Melatonin Other (See Comments)    dizzy    Current Outpatient Medications  Medication Instructions  . amLODipine (NORVASC) 10 mg, Oral, Daily at bedtime  . atorvastatin (LIPITOR) 80 mg, Oral, Daily-1800  . colchicine 0.6 mg, Oral, 2 times daily  . DULoxetine (CYMBALTA) 60 mg, Oral, Daily  . ezetimibe (ZETIA) 10 mg, Oral, Daily  . fenofibrate 54 mg, Oral, Daily  . Fish Oil 2,000 mg, Oral, Daily, Reported on 02/13/2016  . furosemide (LASIX) 40 mg, Oral, Daily  .  insulin lispro (HUMALOG) 15-25 Units, Subcutaneous, See admin instructions, Inject 15 units subcutaneously after breakfast and 25 units after lunch and supper   . Lutein-Zeaxanthin 25-5 MG CAPS 1 tablet, Oral, Daily  . metoprolol tartrate (LOPRESSOR) 25 mg, Oral, 2 times daily  . mirtazapine (REMERON) 7.5 mg, Oral, Daily at bedtime  . Multiple Vitamin (MULTIVITAMIN WITH MINERALS) TABS tablet 1 tablet, Oral, Daily  . ONETOUCH VERIO test strip No dose, route, or frequency recorded.  Vladimir Faster Glycol-Propyl Glycol (SYSTANE OP) 1-2 drops, Both Eyes, 4 times daily PRN  .  PRESCRIPTION MEDICATION Inhalation, Daily at bedtime, CPAP   . SURE COMFORT PEN NEEDLES 31G X 5 MM MISC No dose, route, or frequency recorded.  Nelva Nay SoloStar 85 Units, Subcutaneous, 2 times daily     Objective:   VITALS:   Vitals:   03/26/21 0843  BP: 104/60  Pulse: (!) 102  SpO2: 98%  Weight: 259 lb (117.5 kg)  Height: 6\' 4"  (1.93 m)   Gen:  Appears stated age and in NAD. HEENT:  Normocephalic, atraumatic. The mucous membranes are moist.    NEUROLOGICAL:  Orientation:  The patient is alert and oriented x 3.   Cranial nerves: There is good facial symmetry. Extraocular muscles are intact and visual fields are full to confrontational testing. Speech is fluent and clear today. Soft palate rises symmetrically and there is no tongue deviation. Hearing is intact to conversational tone. Tone: Tone is good throughout. Gait and Station: The patient is somewhat off balance  MOVEMENT EXAM: Tremor: Prior to programming, there is mild postural tremor on the left (no DBS on that side).  There is no postural tremor on the right.  However, when the right arm is held in the proximal (wing beating) physician, he does have moderate tremor on the right prior to programming.  This is much better following programming, although not completely resolved.  He is able to draw Archimedes spirals and write his name fairly well post programming.  I have reviewed and interpreted the following labs independently   Chemistry      Component Value Date/Time   NA 138 02/02/2021 0259   NA 144 11/08/2020 1138   K 4.0 02/02/2021 0259   CL 106 02/02/2021 0259   CO2 25 02/02/2021 0259   BUN 34 (H) 02/02/2021 0259   BUN 25 11/08/2020 1138   CREATININE 1.68 (H) 02/02/2021 0259      Component Value Date/Time   CALCIUM 8.7 (L) 02/02/2021 0259   ALKPHOS 86 01/29/2021 2021   AST 18 01/29/2021 2021   ALT 19 01/29/2021 2021   BILITOT 0.6 01/29/2021 2021   BILITOT 0.4 10/24/2020 0943      Lab Results   Component Value Date   WBC 8.8 02/01/2021   HGB 14.1 02/01/2021   HCT 42.8 02/01/2021   MCV 91.1 02/01/2021   PLT 141 (L) 02/01/2021   Lab Results  Component Value Date   TSH 1.421 01/30/2021      Total time spent on today's visit was 15 minutes, including both face-to-face time and nonface-to-face time.  Time included that spent on review of records (prior notes available to me/labs/imaging if pertinent), discussing treatment and goals, answering patient's questions and coordinating care.  This did not include DBS time, which is described in more detail on separate programming procedural note.  CC:  [name], MD

## 2021-03-26 ENCOUNTER — Ambulatory Visit (INDEPENDENT_AMBULATORY_CARE_PROVIDER_SITE_OTHER): Payer: Medicare PPO | Admitting: Neurology

## 2021-03-26 ENCOUNTER — Encounter: Payer: Self-pay | Admitting: Cardiology

## 2021-03-26 ENCOUNTER — Ambulatory Visit: Payer: Medicare PPO | Admitting: Cardiology

## 2021-03-26 ENCOUNTER — Encounter: Payer: Self-pay | Admitting: Neurology

## 2021-03-26 ENCOUNTER — Other Ambulatory Visit: Payer: Self-pay

## 2021-03-26 VITALS — BP 104/60 | HR 102 | Ht 76.0 in | Wt 259.0 lb

## 2021-03-26 VITALS — BP 110/74 | HR 92 | Ht 76.0 in | Wt 257.0 lb

## 2021-03-26 DIAGNOSIS — I639 Cerebral infarction, unspecified: Secondary | ICD-10-CM

## 2021-03-26 DIAGNOSIS — I422 Other hypertrophic cardiomyopathy: Secondary | ICD-10-CM

## 2021-03-26 DIAGNOSIS — E785 Hyperlipidemia, unspecified: Secondary | ICD-10-CM | POA: Diagnosis not present

## 2021-03-26 DIAGNOSIS — R0600 Dyspnea, unspecified: Secondary | ICD-10-CM

## 2021-03-26 DIAGNOSIS — I3139 Other pericardial effusion (noninflammatory): Secondary | ICD-10-CM

## 2021-03-26 DIAGNOSIS — G250 Essential tremor: Secondary | ICD-10-CM | POA: Diagnosis not present

## 2021-03-26 DIAGNOSIS — R0609 Other forms of dyspnea: Secondary | ICD-10-CM

## 2021-03-26 DIAGNOSIS — I313 Pericardial effusion (noninflammatory): Secondary | ICD-10-CM | POA: Diagnosis not present

## 2021-03-26 DIAGNOSIS — I10 Essential (primary) hypertension: Secondary | ICD-10-CM

## 2021-03-26 MED ORDER — FUROSEMIDE 40 MG PO TABS: 40.0000 mg | ORAL_TABLET | ORAL | 3 refills | Status: DC | PRN

## 2021-03-26 MED ORDER — ELIQUIS 5 MG PO TABS: 5.0000 mg | ORAL_TABLET | Freq: Two times a day (BID) | ORAL | 3 refills | Status: DC

## 2021-03-26 NOTE — Patient Instructions (Signed)
Medication Instructions:  Begin Eliquis 5mg  twice daily  Start taking Lasix 40mg  AS NEEDED for weight gain of 3 pounds in 1 day or 5 pounds in 1 week.  *If you need a refill on your cardiac medications before your next appointment, please call your pharmacy*   Lab Work: BMET, Magnesium, UPEP, SPEP, Light chains to be drawn today.   If you have labs (blood work) drawn today and your tests are completely normal, you will receive your results only by: Marland Kitchen MyChart Message (if you have MyChart) OR . A paper copy in the mail If you have any lab test that is abnormal or we need to change your treatment, we will call you to review the results.   Testing/Procedures: None ordered.    Follow-Up: At [facility], you and your health needs are our priority.  As part of our continuing mission to provide you with exceptional heart care, we have created designated Provider Care Teams.  These Care Teams include your primary Cardiologist (physician) and Advanced Practice Providers (APPs -  Physician Assistants and Nurse Practitioners) who all work together to provide you with the care you need, when you need it.  We recommend signing up for the patient portal called "MyChart".  Sign up information is provided on this After Visit Summary.  MyChart is used to connect with patients for Virtual Visits (Telemedicine).  Patients are able to view lab/test results, encounter notes, upcoming appointments, etc.  Non-urgent messages can be sent to your provider as well.   To learn more about what you can do with MyChart, go to [email or web address].    Your next appointment:   6 week(s)  The format for your next appointment:   In Person  Provider:   Oswaldo Milian, MD

## 2021-03-26 NOTE — Procedures (Signed)
DBS Programming was performed.    Manufacturer of DBS device: [employer] was performed.    Manufacturer of DBS device: [employer]  Total time spent programming was 25 minutes.  Device was confirmed to be on.  Soft start was confirmed to be on.  Impedences were checked and were within normal limits.  Battery was checked and was determined to be functioning normally and not near the end of life.  Final settings were as follows, with program 2 being active:   Active Contacts Amplitude (mA) PW (ms) Frequency (hz)   Left Brain       Program 1       11/28/2019 5-C+ 2.5 60 130   04/25/20 5-C+ 2.7 60 130   10/25/20 5-C+ 3.5(2.1-3.8) 60 130                               Program 2       11/28/19 5-8+ 2.2 60 130   04/25/20 5-8+ 3.2 60 130                         10/25/20 5-8+ 3.6 (3.0-4.0) 70 130   03/26/21 5-8+ 4.1 (3.0-4.4) 90 130                               Right Brain       Not active                        Prior visits:  Monopolar review: Left brain electrode:     1-C+           ; Amplitude  1.0   ma   ; Pulse width 60 microseconds;   Frequency   130   Hz.  (Tongue paresthesias) Left brain electrode:     (2-3-4-) 33% each C+           ; Amplitude  2.0   ma   ; Pulse width 60 microseconds;   Frequency   130   Hz.  (Tongue and lip paresthesias, fairly good tremor control) Left brain electrode:     (5-6-7-) 33% C+           ; Amplitude  2.0   ma   ; Pulse width 60 microseconds;   Frequency   130   Hz.  (Tongue paresthesias but no lip paresthesia and good tremor control) Left brain electrode:     8-C+           ; Amplitude  1.0   ma   ; Pulse width 60 microseconds;   Frequency   130   Hz.  (Paresthesias resolved)

## 2021-03-26 NOTE — Progress Notes (Signed)
Cardiology Office Note:    Date:  03/26/2021   ID:  Joshua [name], DOB 09-10-1951, MRN [id number]  PCP:  [name], MD  Cardiologist:  Donato Heinz, MD  Electrophysiologist:  None   Referring MD: [name], MD   Chief Complaint  Patient presents with  . Fatigue    History of Present Illness:    Joshua [name] is a 70 y.o. male with a hx of HCM, hypertension, diabetes, OSA, PE after knee surgery, essential tremor status post DBS June 2020, CVA who presents for follow-up.  Admitted to [facility] from 08/28/19 through 08/31/19 with an acute CVA.  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  MRI brain showed right internal capsule infarct.  Echocardiogram showed no cardiogenic source of embolism.  He was started on heparin given subclavian artery thrombosis, and this was transitioned to Eliquis.  It was unclear if the thrombosis was due to a cardiac source or developed in situ.  Stroke team recommended Eliquis for 2 months and repeating CTA neck; if CTA neck negative and no atrial fibrillation noted on 30-day monitor, stroke team recommended stopping Eliquis and resuming antiplatelet agent.  TTE was notable for an incidental finding of asymmetric basal septal hypertrophy.  He was referred to cardiology and seen on 09/19/19.  Cardiac MRI was ordered, which showed Basal septal hypertrophy measuring up to 37mm (lateral wall 55mm), consistent with hypertrophic cardiomyopathy.  PYP scan showed no evidence of amyloid.  Cardiac monitor showed no VT, occasional PVCs (1.3% of beats).   Repeat monitor on 08/22/2020 showed 1 4 beat run of NSVT, 6 runs of SVT longest lasting 16 beats, occasional PVCs (4%).  He denies any family history of HCM.  Does have vertigo.  No syncope except with CVA years ago.  He was admitted to [facility] from 3/8 through 02/02/2021 with shortness of breath and elevated effort for cardiac fusion.  Started on colchicine and Eliquis was held.   No adequate window for pericardiocentesis.  Repeat echo 03/08/2021 showed pericardial effusion had resolved, LVEF 55 to 01%, grade 1 diastolic dysfunction, moderate LVH, strain abnormalities suggestive of cardiac amyloidosis.  Lexiscan Myoview on 02/12/2021 showed no evidence of ischemia, EF 53%.  Since last clinic visit, he reports he has been doing okay.  States that main complaint is that he feels tired all the time.  He denies any chest pain.  Does report having some dyspnea.  Continues to have lightheadedness which he attributes to vertigo, denies any syncope.  Denies any lower extremity edema.  No palpitations.   Wt Readings from Last 3 Encounters:  03/26/21 257 lb (116.6 kg)  03/26/21 259 lb (117.5 kg)  02/11/21 257 lb (116.6 kg)      Past Medical History:  Diagnosis Date  . Benign essential tremor   . Benign positional vertigo   . CVA (cerebral vascular accident) ([place])    x2 - L retina, 1 right parietal  . Degenerative arthritis   . Depression   . Diabetes mellitus   . DVT (deep venous thrombosis) ([place]) 2018  . Dyslipidemia   . GERD (gastroesophageal reflux disease)    hiatal hernia  . Gout   . H/O: vasectomy   . Hearing aid worn    b/l  . Hx of appendectomy   . Hx of tonsillectomy   . Hypertension   . Hypertrophic cardiomyopathy ([place])   . 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  Melanoma ([place])   . NSVT (nonsustained ventricular tachycardia) ([facility])    1 4 beat run on event monitor in 07/2020  . Obesity   . OSA on CPAP    setting = 5  . Pulmonary emboli ([place]) 2018  . PVC's (premature ventricular contractions)   . SVT (supraventricular tachycardia) ([facility])    by event monitor  . Tremor, essential 06/22/2017  . Wears glasses     Past Surgical History:  Procedure Laterality Date  . APPENDECTOMY    . arthroscopic knee surgery Bilateral   . CATARACT EXTRACTION Bilateral   . COLONOSCOPY    . MINOR PLACEMENT OF FIDUCIAL N/A 06/30/2019   Procedure: Fiducial  placement;  Surgeon: Erline Levine, MD;  Location: [place];  Service: Neurosurgery;  Laterality: N/A;  Fiducial placement  . NASAL SEPTUM SURGERY    . PULSE GENERATOR IMPLANT N/A 07/14/2019   Procedure: Left cranial Implanted Pulse Generator and lead extension placement to right chest ;  Surgeon: Erline Levine, MD;  Location: [place];  Service: Neurosurgery;  Laterality: N/A;  . SUBTHALAMIC STIMULATOR INSERTION Left 07/07/2019   Procedure: LEFT DEEP BRAIN STIMULATOR PLACEMENT;  Surgeon: Erline Levine, MD;  Location: [place];  Service: Neurosurgery;  Laterality: Left;  . TONSILLECTOMY    . TOTAL KNEE ARTHROPLASTY Left 03/30/2017   Procedure: LEFT TOTAL KNEE ARTHROPLASTY;  Surgeon: Paralee Cancel, MD;  Location: [facility];  Service: Orthopedics;  Laterality: Left;  . WISDOM TOOTH EXTRACTION      Current Medications: Current Meds  Medication Sig  . amLODipine (NORVASC) 10 MG tablet Take 1 tablet (10 mg total) by mouth at bedtime.  Marland Kitchen apixaban (ELIQUIS) 5 MG TABS tablet Take 1 tablet (5 mg total) by mouth 2 (two) times daily.  Marland Kitchen atorvastatin (LIPITOR) 80 MG tablet Take 1 tablet (80 mg total) by mouth daily at 6 PM.  . colchicine 0.6 MG tablet Take 1 tablet (0.6 mg total) by mouth 2 (two) times daily.  . DULoxetine (CYMBALTA) 60 MG capsule Take 60 mg by mouth daily.  Marland Kitchen escitalopram (LEXAPRO) 20 MG tablet 20 mg.  . ezetimibe (ZETIA) 10 MG tablet Take 1 tablet (10 mg total) by mouth daily.  . fenofibrate 54 MG tablet Take 54 mg by mouth daily.  . fluorometholone (FML) 0.1 % ophthalmic suspension fluorometholone 0.1 % eye drops,suspension  . Insulin Glargine, 1 Unit Dial, (TOUJEO SOLOSTAR) 300 UNIT/ML SOPN Inject 85 Units into the skin 2 (two) times daily.  . insulin lispro (HUMALOG) 100 UNIT/ML KwikPen Inject 15-25 Units into the skin See admin instructions. Inject 15 units subcutaneously after breakfast and 25 units after lunch and supper  . losartan (COZAAR) 100 MG tablet Take 1 tablet by mouth daily.  .  Lutein-Zeaxanthin 25-5 MG CAPS Take 1 tablet by mouth daily.  . metFORMIN (GLUCOPHAGE) 500 MG tablet 500 mg.  . metoprolol tartrate (LOPRESSOR) 25 MG tablet Take 1 tablet (25 mg total) by mouth 2 (two) times daily.  . mirtazapine (REMERON) 7.5 MG tablet Take 7.5 mg by mouth at bedtime.  . Multiple Vitamin (MULTIVITAMIN WITH MINERALS) TABS tablet Take 1 tablet by mouth daily.  . Omega-3 Fatty Acids (FISH OIL) 1000 MG CAPS Take 2,000 mg by mouth daily. Reported on 02/13/2016  . ONETOUCH VERIO test strip   . Polyethyl Glycol-Propyl Glycol (SYSTANE OP) Place 1-2 drops into both eyes 4 (four) times daily as needed (dry eyes).   Marland Kitchen PRESCRIPTION MEDICATION Inhale into the lungs at bedtime. CPAP  . SURE COMFORT PEN  NEEDLES 31G X 5 MM MISC   . [DISCONTINUED] furosemide (LASIX) 40 MG tablet Take 40 mg by mouth daily.     Allergies:   Melatonin   Social History   Socioeconomic History  . Marital status: Married    Spouse name: [name]  . Number of children: 2  . Years of education: Bachelors   . Highest education level: Bachelor's degree (e.g., BA, AB, BS)  Occupational History  . Occupation: retired    Fish farm manager: [employer]    Comment: teaching/coaching  Tobacco Use  . Smoking status: Former Smoker    Packs/day: 1.00    Years: 10.00    Pack years: 10.00    Types: Cigarettes    Quit date: 11/25/1983    Years since quitting: 37.3  . Smokeless tobacco: Never Used  Vaping Use  . Vaping Use: Never used  Substance and Sexual Activity  . Alcohol use: Yes    Alcohol/week: 2.0 standard drinks    Types: 2 Standard drinks or equivalent per week    Comment: occassionally  . Drug use: No  . Sexual activity: Not on file  Other Topics Concern  . Not on file  Social History Narrative   Patient lives at home with wife. [name](   Patient has 2 children that are in good health.    Patient works for [employer]. Retired .   Patient has a Bachelors degree in History.    Social  Determinants of Health   Financial Resource Strain: Not on file  Food Insecurity: Not on file  Transportation Needs: Not on file  Physical Activity: Not on file  Stress: Not on file  Social Connections: Not on file     Family History: The patient's family history includes Alzheimer's disease in his father; Cerebral aneurysm in his mother; Diabetes in his maternal grandmother; Healthy in his son; Tremor in his brother, maternal uncle, and mother. There is no history of Colon cancer.  ROS:   Please see the history of present illness.     All other systems reviewed and are negative.  EKGs/Labs/Other Studies Reviewed:    The following studies were reviewed today:   EKG:  EKG is ordered today.  EKG ordered shows sinus rhythm, first-degree AV block, rate 92, left axis deviation, Q waves II, III, aVF, poor R wave progression  Cardiac MRI 10/07/19: 1. Limited study, as only a few sequences were able to be completed due to limitations from presence of deep brain stimulator 2. Basal septal hypertrophy measuring up to 48mm (lateral wall 12 mm), consistent with hypertrophic cardiomyopathy 3. Patchy late gadolinium enhancement in basal septum, consistent with HCM 4. Basal inferolateral midwall LGE, which would not be a typical pattern for HCM, as more commonly seen in setting of prior myocarditis or sarcoidosis. 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  Normal LV size with hyperdynamic systolic function (EF [id number]) 6.  Normal RV size and systolic function (EF [id number])  TTE 08/29/19: 1. Technically difficult study. Left ventricular ejection fraction appears grossly normal, approximately 55-60%, though difficult visualization even with contrast 2. There is asymmetric basal septal hypertrophy measuring 18 mm in basal septum (12 mm posterior wall). Consider cardiac MRI to assess for hypertrophic cardiomyopathy if clinically indicated 3. Definity  contrast agent was given IV to delineate the left ventricular endocardial borders. 4. Global right ventricle has normal systolic function.The right ventricular size is normal. No increase in right ventricular wall thickness. 5. There  is mild dilatation of the aortic root measuring 39 mm. 6. The inferior vena cava is dilated in size with <50% respiratory variability, suggesting right atrial pressure of 15 mmHg.  PYP scan 10/25/19:  The study is normal.  No evidence of TTR amyloidosis.   Cardiac monitor 11/11/19:  No significant abnormalities  No atrial fibrillation. No VT  Occasional PVCs (1.3% of beats).  1 patient triggered event, which appears to correspond to short pause (1.1 seconds) from a blocked PA   Predominant rhythm is sinus rhythm. Range is 57 to 137 bpm with average of 89 bpm. No atrial fibrillation, sustained ventricular tachycardia, significant pause, or high degree AV block. Occasional PVCs (1.3% of beats). 1 patient triggered events. Triggered event appears to correspond to short pause (1.1 seconds) from a blocked PAC.  No significant abnormalities.   Recent Labs: 01/29/2021: ALT 19; B Natriuretic Peptide 13.4 01/30/2021: TSH 1.421 02/01/2021: Hemoglobin 14.1; Magnesium 1.8; Platelets 141 02/02/2021: BUN 34; Creatinine, Ser 1.68; Potassium 4.0; Sodium 138  Recent Lipid Panel    Component Value Date/Time   CHOL 161 10/24/2020 0943   TRIG 180 (H) 10/24/2020 0943   HDL 30 (L) 10/24/2020 0943   CHOLHDL 5.4 (H) 10/24/2020 0943   CHOLHDL 7.0 08/29/2019 0409   VLDL 50 (H) 08/29/2019 0409   LDLCALC 99 10/24/2020 0943    Physical Exam:    VS:  BP 110/74   Pulse 92   Ht 6\' 4"  (1.93 m)   Wt 257 lb (116.6 kg)   SpO2 99%   BMI 31.28 kg/m     Wt Readings from Last 3 Encounters:  03/26/21 257 lb (116.6 kg)  03/26/21 259 lb (117.5 kg)  02/11/21 257 lb (116.6 kg)     GEN: Well nourished, well developed in no acute distress HEENT: Normal NECK: No JVD LYMPHATICS:  No lymphadenopathy CARDIAC: RRR, no murmurs, rubs, gallops RESPIRATORY:  Clear to auscultation without rales, wheezing or rhonchi  ABDOMEN: Soft, non-tender, non-distended MUSCULOSKELETAL:  No edema; No deformity  SKIN: Warm and dry NEUROLOGIC:  Alert and oriented x 3 PSYCHIATRIC:  Normal affect   ASSESSMENT:    1. Hypertrophic cardiomyopathy ([place])   2. Essential hypertension   3. Pericardial effusion   4. DOE (dyspnea on exertion)   5. Cerebrovascular accident (CVA), unspecified mechanism ([place])   6. Hyperlipidemia, unspecified hyperlipidemia type    PLAN:    Hypertrophic cardiomyopathy: 8mm (lateral wall 12 mm) on CMR.  In addition, had unusual scar pattern for HCM, with basal inferolateral scar on MRI.  Fabry's disease can be associated with this scar pattern and asymmetric hypertrophy, so alpha galactosidase was checked and was normal. Scar pattern not c/w amyloid on CMR, but unfortunately unable to do T1 mapping to r/o amyloid due to DBS.  No evidence of amyloid on PYP scan.  Scar pattern may represent prior episode of myocarditis.  Suspect HCM.  No obstruction.  Recommended screening of first-degree relatives with TTE, he reports that he has discussed with his sons.  Cardiac monitor on 08/22/2020 showed one 4 beat run of NSVT, 6 runs of SVT longest lasting 16 beats, occasional PVCs (4%).  -Check SPEP/UPEP/light chains to evaluate for AL amyloid -Continue metoprolol 25 mg twice daily -Which favor avoiding diuretics given suspected HCM.  Will change Lasix to as needed.  Asked to monitor daily weights and take if gains more than 3 pounds in 1 day or 5 pounds in 1 week  Pericardial effusion: Large effusion noted on echocardiogram 01/2021.  Started on colchicine for suspected pericarditis.  Repeat echocardiogram 03/12/2021 shows resolution of effusion -Restart Eliquis given effusion resolved -Continue colchicine for 6 months course  Dyspnea: Reports dyspnea with minimal exertion.  Echo and  [employer] results as above  CVA: Subclavian artery thrombosis diagnosed, unclear if cardioembolic source or developed in situ.    Will restart Eliquis as above.  No AF on cardiac monitor.  Hypertension: On amlodipine 5 mg daily,  losartan 100 mg daily.  Appears controlled  Hyperlipidemia: On atorvastatin 80 mg daily.  LDL 99 on 10/24/2020.  Calcium score 588 (68th percentile).  Zetia 10 mg daily was added  Type 2 diabetes: A1c 8.9% on 01/30/2021.  On insulin  PVCs: occasional (1.3%) on monitor.  He is asymptomatic and with normal LV systolic function, no treatment indicated   RTC in 6 to 8 weeks    Medication Adjustments/Labs and Tests Ordered: Current medicines are reviewed at length with the patient today.  Concerns regarding medicines are outlined above.  Orders Placed This Encounter  Procedures  . Kappa/lambda light chains  . PE and FLC, Serum  . Protein electrophoresis, serum  . Protein Electrophoresis, Urine Rflx.  . Basic metabolic panel  . Magnesium  . EKG 12-Lead   Meds ordered this encounter  Medications  . apixaban (ELIQUIS) 5 MG TABS tablet    Sig: Take 1 tablet (5 mg total) by mouth 2 (two) times daily.    Dispense:  180 tablet    Refill:  3  . furosemide (LASIX) 40 MG tablet    Sig: Take 1 tablet (40 mg total) by mouth as needed (Weight increase of 3 pounds in 1 day or 5 pounds in 1 week.).    Dispense:  30 tablet    Refill:  3    Patient Instructions  Medication Instructions:  Begin Eliquis 5mg  twice daily  Start taking Lasix 40mg  AS NEEDED for weight gain of 3 pounds in 1 day or 5 pounds in 1 week.  *If you need a refill on your cardiac medications before your next appointment, please call your pharmacy*   Lab Work: BMET, Magnesium, UPEP, SPEP, Light chains to be drawn today.   If you have labs (blood work) drawn today and your tests are completely normal, you will receive your results only by: Marland Kitchen MyChart Message (if you have MyChart)  OR . A paper copy in the mail If you have any lab test that is abnormal or we need to change your treatment, we will call you to review the results.   Testing/Procedures: None ordered.    Follow-Up: At [facility], you and your health needs are our priority.  As part of our continuing mission to provide you with exceptional heart care, we have created designated Provider Care Teams.  These Care Teams include your primary Cardiologist (physician) and Advanced Practice Providers (APPs -  Physician Assistants and Nurse Practitioners) who all work together to provide you with the care you need, when you need it.  We recommend signing up for the patient portal called "MyChart".  Sign up information is provided on this After Visit Summary.  MyChart is used to connect with patients for Virtual Visits (Telemedicine).  Patients are able to view lab/test results, encounter notes, upcoming appointments, etc.  Non-urgent messages can be sent to your provider as well.   To learn more about what you can do with MyChart, go to [email or web address].    Your next appointment:   6 week(s)  The  format for your next appointment:   In Person  Provider:   Oswaldo Milian, MD        Signed, Donato Heinz, MD  03/26/2021 11:59 PM    [place]

## 2021-03-28 LAB — PE AND FLC, SERUM
A/G Ratio: 1.2 (ref 0.7–1.7)
Albumin ELP: 3.6 g/dL (ref 2.9–4.4)
Alpha 1: 0.2 g/dL (ref 0.0–0.4)
Alpha 2: 0.9 g/dL (ref 0.4–1.0)
Beta: 1 g/dL (ref 0.7–1.3)
Gamma Globulin: 0.9 g/dL (ref 0.4–1.8)
Globulin, Total: 3 g/dL (ref 2.2–3.9)
Ig Kappa Free Light Chain: 56.8 mg/L — ABNORMAL HIGH (ref 3.3–19.4)
Ig Lambda Free Light Chain: 52.5 mg/L — ABNORMAL HIGH (ref 5.7–26.3)
KAPPA/LAMBDA RATIO: 1.08 (ref 0.26–1.65)
Total Protein: 6.6 g/dL (ref 6.0–8.5)

## 2021-03-28 LAB — PROTEIN ELECTROPHORESIS, URINE REFLEX
Albumin ELP, Urine: 78.3 %
Alpha-1-Globulin, U: 1.7 %
Alpha-2-Globulin, U: 4.4 %
Beta Globulin, U: 9.4 %
Gamma Globulin, U: 6.2 %
Protein, Ur: 188.8 mg/dL

## 2021-03-28 LAB — PROTEIN ELECTROPHORESIS, SERUM

## 2021-03-28 LAB — BASIC METABOLIC PANEL WITH GFR
BUN/Creatinine Ratio: 19 (ref 10–24)
BUN: 32 mg/dL — ABNORMAL HIGH (ref 8–27)
CO2: 23 mmol/L (ref 20–29)
Calcium: 9.3 mg/dL (ref 8.6–10.2)
Chloride: 102 mmol/L (ref 96–106)
Creatinine, Ser: 1.72 mg/dL — ABNORMAL HIGH (ref 0.76–1.27)
Glucose: 170 mg/dL — ABNORMAL HIGH (ref 65–99)
Potassium: 4.2 mmol/L (ref 3.5–5.2)
Sodium: 141 mmol/L (ref 134–144)
eGFR: 42 mL/min/1.73 — ABNORMAL LOW (ref >59)

## 2021-03-28 LAB — MAGNESIUM: Magnesium: 2 mg/dL (ref 1.6–2.3)

## 2021-04-01 DIAGNOSIS — E1121 Type 2 diabetes mellitus with diabetic nephropathy: Secondary | ICD-10-CM | POA: Diagnosis not present

## 2021-04-02 DIAGNOSIS — E1121 Type 2 diabetes mellitus with diabetic nephropathy: Secondary | ICD-10-CM | POA: Diagnosis not present

## 2021-04-04 ENCOUNTER — Ambulatory Visit: Payer: Medicare PPO | Admitting: Cardiology

## 2021-04-04 DIAGNOSIS — D485 Neoplasm of uncertain behavior of skin: Secondary | ICD-10-CM | POA: Diagnosis not present

## 2021-04-04 DIAGNOSIS — D0439 Carcinoma in situ of skin of other parts of face: Secondary | ICD-10-CM | POA: Diagnosis not present

## 2021-04-04 DIAGNOSIS — L570 Actinic keratosis: Secondary | ICD-10-CM | POA: Diagnosis not present

## 2021-04-05 ENCOUNTER — Other Ambulatory Visit: Payer: Self-pay | Admitting: Cardiology

## 2021-04-17 DIAGNOSIS — R4781 Slurred speech: Secondary | ICD-10-CM | POA: Diagnosis not present

## 2021-04-17 DIAGNOSIS — I358 Other nonrheumatic aortic valve disorders: Secondary | ICD-10-CM | POA: Diagnosis not present

## 2021-04-17 DIAGNOSIS — F32A Depression, unspecified: Secondary | ICD-10-CM | POA: Diagnosis not present

## 2021-04-17 DIAGNOSIS — Z7901 Long term (current) use of anticoagulants: Secondary | ICD-10-CM | POA: Diagnosis not present

## 2021-04-17 DIAGNOSIS — Z79899 Other long term (current) drug therapy: Secondary | ICD-10-CM | POA: Diagnosis not present

## 2021-04-17 DIAGNOSIS — I639 Cerebral infarction, unspecified: Secondary | ICD-10-CM | POA: Diagnosis not present

## 2021-04-17 DIAGNOSIS — R0602 Shortness of breath: Secondary | ICD-10-CM | POA: Diagnosis not present

## 2021-04-17 DIAGNOSIS — E785 Hyperlipidemia, unspecified: Secondary | ICD-10-CM | POA: Diagnosis not present

## 2021-04-17 DIAGNOSIS — Z8673 Personal history of transient ischemic attack (TIA), and cerebral infarction without residual deficits: Secondary | ICD-10-CM | POA: Diagnosis not present

## 2021-04-17 DIAGNOSIS — F419 Anxiety disorder, unspecified: Secondary | ICD-10-CM | POA: Diagnosis not present

## 2021-04-17 DIAGNOSIS — G459 Transient cerebral ischemic attack, unspecified: Secondary | ICD-10-CM | POA: Diagnosis not present

## 2021-04-17 DIAGNOSIS — Z86711 Personal history of pulmonary embolism: Secondary | ICD-10-CM | POA: Diagnosis not present

## 2021-04-17 DIAGNOSIS — I361 Nonrheumatic tricuspid (valve) insufficiency: Secondary | ICD-10-CM | POA: Diagnosis not present

## 2021-04-17 DIAGNOSIS — I340 Nonrheumatic mitral (valve) insufficiency: Secondary | ICD-10-CM | POA: Diagnosis not present

## 2021-04-18 DIAGNOSIS — G459 Transient cerebral ischemic attack, unspecified: Secondary | ICD-10-CM | POA: Diagnosis not present

## 2021-04-18 DIAGNOSIS — G250 Essential tremor: Secondary | ICD-10-CM | POA: Diagnosis not present

## 2021-04-18 DIAGNOSIS — E1122 Type 2 diabetes mellitus with diabetic chronic kidney disease: Secondary | ICD-10-CM | POA: Diagnosis not present

## 2021-04-18 DIAGNOSIS — I10 Essential (primary) hypertension: Secondary | ICD-10-CM | POA: Diagnosis not present

## 2021-04-19 ENCOUNTER — Emergency Department (HOSPITAL_BASED_OUTPATIENT_CLINIC_OR_DEPARTMENT_OTHER): Payer: Medicare PPO

## 2021-04-19 ENCOUNTER — Telehealth: Payer: Self-pay | Admitting: Cardiology

## 2021-04-19 ENCOUNTER — Emergency Department (HOSPITAL_BASED_OUTPATIENT_CLINIC_OR_DEPARTMENT_OTHER)
Admission: EM | Admit: 2021-04-19 | Discharge: 2021-04-20 | Disposition: A | Discharge status: Home / Self Care | Payer: Medicare PPO | Source: Home / Self Care | Attending: Emergency Medicine | Admitting: Emergency Medicine

## 2021-04-19 ENCOUNTER — Telehealth: Payer: Self-pay | Admitting: Neurology

## 2021-04-19 DIAGNOSIS — I748 Embolism and thrombosis of other arteries: Secondary | ICD-10-CM | POA: Diagnosis present

## 2021-04-19 DIAGNOSIS — N189 Chronic kidney disease, unspecified: Secondary | ICD-10-CM | POA: Insufficient documentation

## 2021-04-19 DIAGNOSIS — I6381 Other cerebral infarction due to occlusion or stenosis of small artery: Secondary | ICD-10-CM | POA: Diagnosis not present

## 2021-04-19 DIAGNOSIS — Z85820 Personal history of malignant melanoma of skin: Secondary | ICD-10-CM | POA: Insufficient documentation

## 2021-04-19 DIAGNOSIS — R4781 Slurred speech: Secondary | ICD-10-CM | POA: Diagnosis present

## 2021-04-19 DIAGNOSIS — Z96652 Presence of left artificial knee joint: Secondary | ICD-10-CM | POA: Insufficient documentation

## 2021-04-19 DIAGNOSIS — I5032 Chronic diastolic (congestive) heart failure: Secondary | ICD-10-CM | POA: Diagnosis present

## 2021-04-19 DIAGNOSIS — Z7984 Long term (current) use of oral hypoglycemic drugs: Secondary | ICD-10-CM | POA: Insufficient documentation

## 2021-04-19 DIAGNOSIS — R531 Weakness: Secondary | ICD-10-CM | POA: Diagnosis not present

## 2021-04-19 DIAGNOSIS — G250 Essential tremor: Secondary | ICD-10-CM | POA: Diagnosis present

## 2021-04-19 DIAGNOSIS — Z9989 Dependence on other enabling machines and devices: Secondary | ICD-10-CM | POA: Diagnosis not present

## 2021-04-19 DIAGNOSIS — G4733 Obstructive sleep apnea (adult) (pediatric): Secondary | ICD-10-CM | POA: Diagnosis not present

## 2021-04-19 DIAGNOSIS — I10 Essential (primary) hypertension: Secondary | ICD-10-CM | POA: Diagnosis not present

## 2021-04-19 DIAGNOSIS — I6330 Cerebral infarction due to thrombosis of unspecified cerebral artery: Secondary | ICD-10-CM | POA: Diagnosis not present

## 2021-04-19 DIAGNOSIS — I708 Atherosclerosis of other arteries: Secondary | ICD-10-CM | POA: Diagnosis not present

## 2021-04-19 DIAGNOSIS — R0600 Dyspnea, unspecified: Secondary | ICD-10-CM | POA: Diagnosis present

## 2021-04-19 DIAGNOSIS — E669 Obesity, unspecified: Secondary | ICD-10-CM | POA: Diagnosis present

## 2021-04-19 DIAGNOSIS — I69398 Other sequelae of cerebral infarction: Secondary | ICD-10-CM | POA: Diagnosis not present

## 2021-04-19 DIAGNOSIS — Z87891 Personal history of nicotine dependence: Secondary | ICD-10-CM | POA: Insufficient documentation

## 2021-04-19 DIAGNOSIS — Z86711 Personal history of pulmonary embolism: Secondary | ICD-10-CM | POA: Insufficient documentation

## 2021-04-19 DIAGNOSIS — I639 Cerebral infarction, unspecified: Secondary | ICD-10-CM

## 2021-04-19 DIAGNOSIS — I129 Hypertensive chronic kidney disease with stage 1 through stage 4 chronic kidney disease, or unspecified chronic kidney disease: Secondary | ICD-10-CM | POA: Insufficient documentation

## 2021-04-19 DIAGNOSIS — R29702 NIHSS score 2: Secondary | ICD-10-CM | POA: Diagnosis present

## 2021-04-19 DIAGNOSIS — H5462 Unqualified visual loss, left eye, normal vision right eye: Secondary | ICD-10-CM | POA: Diagnosis present

## 2021-04-19 DIAGNOSIS — Z0389 Encounter for observation for other suspected diseases and conditions ruled out: Secondary | ICD-10-CM | POA: Diagnosis not present

## 2021-04-19 DIAGNOSIS — N1832 Chronic kidney disease, stage 3b: Secondary | ICD-10-CM | POA: Diagnosis not present

## 2021-04-19 DIAGNOSIS — Z79899 Other long term (current) drug therapy: Secondary | ICD-10-CM | POA: Insufficient documentation

## 2021-04-19 DIAGNOSIS — E785 Hyperlipidemia, unspecified: Secondary | ICD-10-CM | POA: Insufficient documentation

## 2021-04-19 DIAGNOSIS — B957 Other staphylococcus as the cause of diseases classified elsewhere: Secondary | ICD-10-CM | POA: Diagnosis not present

## 2021-04-19 DIAGNOSIS — Z7901 Long term (current) use of anticoagulants: Secondary | ICD-10-CM | POA: Insufficient documentation

## 2021-04-19 DIAGNOSIS — I130 Hypertensive heart and chronic kidney disease with heart failure and stage 1 through stage 4 chronic kidney disease, or unspecified chronic kidney disease: Secondary | ICD-10-CM | POA: Diagnosis present

## 2021-04-19 DIAGNOSIS — E1169 Type 2 diabetes mellitus with other specified complication: Secondary | ICD-10-CM | POA: Insufficient documentation

## 2021-04-19 DIAGNOSIS — R899 Unspecified abnormal finding in specimens from other organs, systems and tissues: Secondary | ICD-10-CM | POA: Insufficient documentation

## 2021-04-19 DIAGNOSIS — Z794 Long term (current) use of insulin: Secondary | ICD-10-CM | POA: Insufficient documentation

## 2021-04-19 DIAGNOSIS — K219 Gastro-esophageal reflux disease without esophagitis: Secondary | ICD-10-CM | POA: Diagnosis present

## 2021-04-19 DIAGNOSIS — R7881 Bacteremia: Secondary | ICD-10-CM

## 2021-04-19 DIAGNOSIS — E1122 Type 2 diabetes mellitus with diabetic chronic kidney disease: Secondary | ICD-10-CM | POA: Diagnosis present

## 2021-04-19 DIAGNOSIS — I422 Other hypertrophic cardiomyopathy: Secondary | ICD-10-CM | POA: Diagnosis present

## 2021-04-19 DIAGNOSIS — F32A Depression, unspecified: Secondary | ICD-10-CM | POA: Diagnosis present

## 2021-04-19 DIAGNOSIS — M109 Gout, unspecified: Secondary | ICD-10-CM | POA: Diagnosis present

## 2021-04-19 DIAGNOSIS — I071 Rheumatic tricuspid insufficiency: Secondary | ICD-10-CM | POA: Diagnosis not present

## 2021-04-19 DIAGNOSIS — Z20822 Contact with and (suspected) exposure to covid-19: Secondary | ICD-10-CM | POA: Diagnosis present

## 2021-04-19 DIAGNOSIS — Z6831 Body mass index (BMI) 31.0-31.9, adult: Secondary | ICD-10-CM | POA: Diagnosis not present

## 2021-04-19 DIAGNOSIS — I2510 Atherosclerotic heart disease of native coronary artery without angina pectoris: Secondary | ICD-10-CM | POA: Diagnosis present

## 2021-04-19 DIAGNOSIS — Z8673 Personal history of transient ischemic attack (TIA), and cerebral infarction without residual deficits: Secondary | ICD-10-CM | POA: Diagnosis not present

## 2021-04-19 DIAGNOSIS — N1831 Chronic kidney disease, stage 3a: Secondary | ICD-10-CM | POA: Diagnosis present

## 2021-04-19 DIAGNOSIS — I82B12 Acute embolism and thrombosis of left subclavian vein: Secondary | ICD-10-CM | POA: Diagnosis not present

## 2021-04-19 DIAGNOSIS — I6623 Occlusion and stenosis of bilateral posterior cerebral arteries: Secondary | ICD-10-CM | POA: Diagnosis not present

## 2021-04-19 DIAGNOSIS — Z9682 Presence of neurostimulator: Secondary | ICD-10-CM | POA: Diagnosis not present

## 2021-04-19 DIAGNOSIS — I77810 Thoracic aortic ectasia: Secondary | ICD-10-CM | POA: Diagnosis present

## 2021-04-19 LAB — DIFFERENTIAL
Abs Immature Granulocytes: 0.02 10*3/uL (ref 0.00–0.07)
Basophils Absolute: 0.1 10*3/uL (ref 0.0–0.1)
Basophils Relative: 1 %
Eosinophils Absolute: 0.3 10*3/uL (ref 0.0–0.5)
Eosinophils Relative: 3 %
Immature Granulocytes: 0 %
Lymphocytes Relative: 48 %
Lymphs Abs: 4.4 10*3/uL — ABNORMAL HIGH (ref 0.7–4.0)
Monocytes Absolute: 0.6 10*3/uL (ref 0.1–1.0)
Monocytes Relative: 7 %
Neutro Abs: 3.8 10*3/uL (ref 1.7–7.7)
Neutrophils Relative %: 41 %

## 2021-04-19 LAB — URINALYSIS, ROUTINE W REFLEX MICROSCOPIC
Bilirubin Urine: NEGATIVE
Glucose, UA: NEGATIVE mg/dL
Ketones, ur: NEGATIVE mg/dL
Leukocytes,Ua: NEGATIVE
Nitrite: NEGATIVE
Protein, ur: >300 mg/dL — AB
Specific Gravity, Urine: 1.019 (ref 1.005–1.030)
pH: 5.5 (ref 5.0–8.0)

## 2021-04-19 LAB — CBC
HCT: 40 % (ref 39.0–52.0)
Hemoglobin: 13.3 g/dL (ref 13.0–17.0)
MCH: 29.8 pg (ref 26.0–34.0)
MCHC: 33.3 g/dL (ref 30.0–36.0)
MCV: 89.7 fL (ref 80.0–100.0)
Platelets: 231 10*3/uL (ref 150–400)
RBC: 4.46 MIL/uL (ref 4.22–5.81)
RDW: 13.5 % (ref 11.5–15.5)
WBC: 9.3 10*3/uL (ref 4.0–10.5)
nRBC: 0 % (ref 0.0–0.2)

## 2021-04-19 LAB — COMPREHENSIVE METABOLIC PANEL
ALT: 33 U/L (ref 0–44)
AST: 29 U/L (ref 15–41)
Albumin: 3.9 g/dL (ref 3.5–5.0)
Alkaline Phosphatase: 77 U/L (ref 38–126)
Anion gap: 10 (ref 5–15)
BUN: 29 mg/dL — ABNORMAL HIGH (ref 8–23)
CO2: 26 mmol/L (ref 22–32)
Calcium: 8.7 mg/dL — ABNORMAL LOW (ref 8.9–10.3)
Chloride: 106 mmol/L (ref 98–111)
Creatinine, Ser: 1.75 mg/dL — ABNORMAL HIGH (ref 0.61–1.24)
GFR, Estimated: 41 mL/min — ABNORMAL LOW (ref >60)
Glucose, Bld: 87 mg/dL (ref 70–99)
Potassium: 3.6 mmol/L (ref 3.5–5.1)
Sodium: 142 mmol/L (ref 135–145)
Total Bilirubin: 0.4 mg/dL (ref 0.3–1.2)
Total Protein: 6.5 g/dL (ref 6.5–8.1)

## 2021-04-19 LAB — PROTIME-INR
INR: 1 (ref 0.8–1.2)
Prothrombin Time: 13.4 seconds (ref 11.4–15.2)

## 2021-04-19 LAB — LACTIC ACID, PLASMA: Lactic Acid, Venous: 0.9 mmol/L (ref 0.5–1.9)

## 2021-04-19 LAB — APTT: aPTT: 38 seconds — ABNORMAL HIGH (ref 24–36)

## 2021-04-19 NOTE — Telephone Encounter (Signed)
Patient's wife [name] called and said the patient had a stroke while on vacation in [place]. She said records should be forthcoming.  She states that pt was in [facility] in [place] their phone number is [phone]. He was seen in the hospital was Dr Jerelene Redden.

## 2021-04-19 NOTE — Telephone Encounter (Signed)
Cardiology has requested notes.   05/07/21 at 1:30 PM with Dr. Carles Collet.

## 2021-04-19 NOTE — ED Triage Notes (Signed)
Pt to ED from home with c/o slurred speech x 2 days. Pt was admitted to the hospital on 04/17/21 after the first episode and was discharged. Pt PA then called and told the PT to go to the ER after positive blood cultures.

## 2021-04-19 NOTE — Telephone Encounter (Signed)
Returned call to Wife (DPR) she states that pt was in the hospital in [place] after having a stroke. She would like a sooner appointment to be evaluated for this here in the office. She states that pt was in [facility] in [place] their phone number is [phone]. He was seen in the hospital was Dr Jerelene Redden. Pt has appt to see Dr Gardiner Rhyme 04-25-21 they will arrive early for this appointment. I will check and see if there are records from this hosp visit for Dr Chauncey Cruel to review. [facility] # [phone]. [facility] records [phone]. S/w Medical records and explained what was needed and she said that she would fax over documents from hospitalization. Phone number and fax number  and phone number given. She states that she will call back if anything else is needed.

## 2021-04-19 NOTE — Telephone Encounter (Signed)
Patient's wife states the patient was having some kind of stroke in [place] and spent Wednesday in the hospital. She states he rested yesterday and they are on their way home now. She states they will be back by tonight around 8:30 pm. She states they were told to get in touch with his cardiologist and neurologist asap. She states they could not have an MRI due to his device. She states they dd not have the control to turn it off and left it at home so he probably needs an MRI. She states his speech is effected, he is extremely tired, walking slower, and his balance is effected. She states he needs an appointment much sooner than 6/7.

## 2021-04-20 NOTE — ED Notes (Signed)
Pt verbalizes understanding of discharge instructions. Opportunity for questioning and answers were provided. Armand removed by staff, pt discharged from ED to home. Instructed to return to ED if sx worsen.

## 2021-04-20 NOTE — Discharge Instructions (Addendum)
Discussed with the internal medicine specialist.  They do not recommend admission.  But returning for any fevers or any new or worse symptoms.  Otherwise follow-up with your neurologist.  Follow-up with your primary care provider.  They also do not recommend antibiotics at this time.

## 2021-04-20 NOTE — ED Provider Notes (Signed)
[facility] EMERGENCY DEPT Provider Note   CSN: [id number] Arrival date & time: 04/19/21  1959     History Chief Complaint  Patient presents with  . Aphasia  . Abnormal Lab    Joshua [name] is a 70 y.o. male.  Patient was traveling in referral [place].  Previous hometown.  On Wednesday in the morning he developed slurred speech and a strange feeling with his right hand.  He was admitted for stroke.  Slurred speech has persisted in the mild weakness to the right hand has persisted.  They were unable to do MRI because patient has an implanted brain stimulator.  Patient was discharged on the way traveling back here today they got a call from the hospitalist stating that he had a positive blood culture in go into details on the list.  We went into their version of my chart but could not see any blood cultures listed.  Could find lots of other labs and could not find his CT and CTA results.  Which did not show any acute findings.  Patient has not had any fevers.  Did not have any fevers while there in [place].  Past medical history is significant for benign essential tumor.  Benign positional vertigo.  The CVA.  Hypertension hypertrophic cardiomyopathy and a history of pulmonary embolus in the past.        Past Medical History:  Diagnosis Date  . Benign essential tremor   . Benign positional vertigo   . CVA (cerebral vascular accident) ([place])    x2 - L retina, 1 right parietal  . Degenerative arthritis   . Depression   . Diabetes mellitus   . DVT (deep venous thrombosis) ([place]) 2018  . Dyslipidemia   . GERD (gastroesophageal reflux disease)    hiatal hernia  . Gout   . H/O: vasectomy   . Hearing aid worn    b/l  . Hx of appendectomy   . Hx of tonsillectomy   . Hypertension   . Hypertrophic cardiomyopathy ([place])   . 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 Melanoma ([place])   . NSVT (nonsustained ventricular tachycardia) (H[facility])    1 4 beat run on  event monitor in 07/2020  . Obesity   . OSA on CPAP    setting = 5  . Pulmonary emboli ([place]) 2018  . PVC's (premature ventricular contractions)   . SVT (supraventricular tachycardia) (H[facility])    by event monitor  . Tremor, essential 06/22/2017  . Wears glasses     Patient Active Problem List   Diagnosis Date Noted  . DOE (dyspnea on exertion) 01/30/2021  . Acute hypoxemic respiratory failure ([place]) 01/29/2021  . Bilateral hearing loss 05/15/2020  . Bilateral impacted cerumen 05/15/2020  . Fungal otitis externa 05/15/2020  . Cerebral embolism with cerebral infarction 08/29/2019  . 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 Hyperlipidemia 08/28/2019  . Chronic kidney disease 08/28/2019  . Obesity (BMI 30-39.9) 08/28/2019  . Tremor 07/07/2019  . Essential tremor 06/22/2017  . OSA on CPAP 04/10/2017  . Essential hypertension 04/10/2017  . Pulmonary thromboembolism ([place]) 04/10/2017  . PE (pulmonary thromboembolism) ([place]) 04/09/2017  . S/P left TKA 03/30/2017  . S/P total knee replacement 03/30/2017  . Cellulitis of left foot 03/07/2016  . Pre-ulcerative calluses 03/07/2016  . Type 2 diabetes mellitus with left diabetic foot ulcer ([place]) 01/14/2016  . Diabetes mellitus type 2 in obese ([place]) 08/07/2008  . GERD 08/07/2008    Past  Surgical History:  Procedure Laterality Date  . APPENDECTOMY    . arthroscopic knee surgery Bilateral   . CATARACT EXTRACTION Bilateral   . COLONOSCOPY    . MINOR PLACEMENT OF FIDUCIAL N/A 06/30/2019   Procedure: Fiducial placement;  Surgeon: Erline Levine, MD;  Location: [facility];  Service: Neurosurgery;  Laterality: N/A;  Fiducial placement  . NASAL SEPTUM SURGERY    . PULSE GENERATOR IMPLANT N/A 07/14/2019   Procedure: Left cranial Implanted Pulse Generator and lead extension placement to right chest ;  Surgeon: Erline Levine, MD;  Location: [facility];  Service: Neurosurgery;  Laterality: N/A;  . SUBTHALAMIC STIMULATOR INSERTION Left 07/07/2019   Procedure:  LEFT DEEP BRAIN STIMULATOR PLACEMENT;  Surgeon: Erline Levine, MD;  Location: [facility];  Service: Neurosurgery;  Laterality: Left;  . TONSILLECTOMY    . TOTAL KNEE ARTHROPLASTY Left 03/30/2017   Procedure: LEFT TOTAL KNEE ARTHROPLASTY;  Surgeon: Paralee Cancel, MD;  Location: [facility];  Service: Orthopedics;  Laterality: Left;  . WISDOM TOOTH EXTRACTION         Family History  Problem Relation Age of Onset  . Cerebral aneurysm Mother   . Tremor Mother   . Alzheimer's disease Father   . Tremor Brother   . Tremor Maternal Uncle   . Diabetes Maternal Grandmother   . Healthy Son   . Colon cancer Neg Hx     Social History   Tobacco Use  . Smoking status: Former Smoker    Packs/day: 1.00    Years: 10.00    Pack years: 10.00    Types: Cigarettes    Quit date: 11/25/1983    Years since quitting: 37.4  . Smokeless tobacco: Never Used  Vaping Use  . Vaping Use: Never used  Substance Use Topics  . Alcohol use: Yes    Alcohol/week: 2.0 standard drinks    Types: 2 Standard drinks or equivalent per week    Comment: occassionally  . Drug use: No    Home Medications Prior to Admission medications   Medication Sig Start Date End Date Taking? Authorizing Provider  amLODipine (NORVASC) 10 MG tablet Take 1 tablet (10 mg total) by mouth at bedtime. 10/31/20   Donato Heinz, MD  apixaban (ELIQUIS) 5 MG TABS tablet Take 1 tablet (5 mg total) by mouth 2 (two) times daily. 03/26/21   Donato Heinz, MD  atorvastatin (LIPITOR) 80 MG tablet Take 1 tablet (80 mg total) by mouth daily at 6 PM. 08/31/19   Danford, Suann Larry, MD  colchicine 0.6 MG tablet Take 1 tablet (0.6 mg total) by mouth 2 (two) times daily. 02/02/21   Shawna Clamp, MD  DULoxetine (CYMBALTA) 60 MG capsule Take 60 mg by mouth daily.    [provider]  escitalopram (LEXAPRO) 20 MG tablet 20 mg. 03/25/20   [provider]  ezetimibe (ZETIA) 10 MG tablet Take 1 tablet (10 mg total) by mouth daily.  02/26/21 05/27/21  Donato Heinz, MD  fenofibrate 54 MG tablet Take 54 mg by mouth daily.    [provider]  fluorometholone (FML) 0.1 % ophthalmic suspension fluorometholone 0.1 % eye drops,suspension    [provider]  furosemide (LASIX) 40 MG tablet Take 1 tablet (40 mg total) by mouth as needed (Weight increase of 3 pounds in 1 day or 5 pounds in 1 week.). 03/26/21   Donato Heinz, MD  Insulin Glargine, 1 Unit Dial, (TOUJEO SOLOSTAR) 300 UNIT/ML SOPN Inject 85 Units into the skin 2 (  two) times daily.    [provider]  insulin lispro (HUMALOG) 100 UNIT/ML KwikPen Inject 15-25 Units into the skin See admin instructions. Inject 15 units subcutaneously after breakfast and 25 units after lunch and supper    [provider]  losartan (COZAAR) 100 MG tablet Take 1 tablet by mouth daily. 03/25/20   [provider]  Lutein-Zeaxanthin 25-5 MG CAPS Take 1 tablet by mouth daily.    [provider]  metFORMIN (GLUCOPHAGE) 500 MG tablet 500 mg. 03/25/20   [provider]  metoprolol tartrate (LOPRESSOR) 25 MG tablet Take 1 tablet (25 mg total) by mouth 2 (two) times daily. 04/09/21   Donato Heinz, MD  mirtazapine (REMERON) 7.5 MG tablet Take 7.5 mg by mouth at bedtime.    [provider]  Multiple Vitamin (MULTIVITAMIN WITH MINERALS) TABS tablet Take 1 tablet by mouth daily.    [provider]  Omega-3 Fatty Acids (FISH OIL) 1000 MG CAPS Take 2,000 mg by mouth daily. Reported on 02/13/2016    [provider]  [facility] VERIO test strip  09/13/18   [provider]  Polyethyl Glycol-Propyl Glycol (SYSTANE OP) Place 1-2 drops into both eyes 4 (four) times daily as needed (dry eyes).     [provider]  PRESCRIPTION MEDICATION Inhale into the lungs at bedtime. CPAP    [provider]  SURE COMFORT PEN NEEDLES 31G X 5 MM [place]  09/12/19   [provider]     Allergies    Melatonin  Review of Systems   Review of Systems  Constitutional: Negative for chills and fever.  HENT: Negative for congestion, rhinorrhea and sore throat.   Eyes: Negative for visual disturbance.  Respiratory: Negative for cough and shortness of breath.   Cardiovascular: Negative for chest pain and leg swelling.  Gastrointestinal: Negative for abdominal pain, diarrhea, nausea and vomiting.  Genitourinary: Negative for dysuria.  Musculoskeletal: Negative for back pain and neck pain.  Skin: Negative for rash.  Neurological: Positive for speech difficulty and weakness. Negative for dizziness, light-headedness and headaches.  Hematological: Does not bruise/bleed easily.  Psychiatric/Behavioral: Negative for confusion.    Physical Exam Updated Vital Signs BP (!) 157/107 (BP Location: Right Arm)   Pulse 73   Temp 98.9 F (37.2 C) (Oral)   Resp 16   Ht 1.93 m (6\' 4" )   Wt 113.4 kg   SpO2 100%   BMI 30.43 kg/m   Physical Exam Vitals and nursing note reviewed.  Constitutional:      General: He is not in acute distress.    Appearance: Normal appearance. He is well-developed. He is not ill-appearing.  HENT:     Head: Normocephalic and atraumatic.  Eyes:     Extraocular Movements: Extraocular movements intact.     Conjunctiva/sclera: Conjunctivae normal.     Pupils: Pupils are equal, round, and reactive to light.  Cardiovascular:     Rate and Rhythm: Normal rate and regular rhythm.     Heart sounds: No murmur heard.   Pulmonary:     Effort: Pulmonary effort is normal. No respiratory distress.     Breath sounds: Normal breath sounds.  Abdominal:     Palpations: Abdomen is soft.     Tenderness: There is no abdominal tenderness.  Musculoskeletal:        General: Normal range of motion.     Cervical back: Normal range of motion and neck supple.  Skin:    General: Skin is warm  and dry.  Neurological:     Mental Status: He is alert.     Cranial Nerves:  Cranial nerve deficit present.     Sensory: No sensory deficit.     Motor: Weakness present.     Gait: Gait normal.     Comments: Patient definitely with some slurred speech.  A little bit of grip strength weakness to the right hand.  Does have a little bit of bilateral tremors.     ED Results / Procedures / Treatments   Labs (all labs ordered are listed, but only abnormal results are displayed) Labs Reviewed  DIFFERENTIAL - Abnormal; Notable for the following components:      Result Value   Lymphs Abs 4.4 (*)    All other components within normal limits  COMPREHENSIVE METABOLIC PANEL - Abnormal; Notable for the following components:   BUN 29 (*)    Creatinine, Ser 1.75 (*)    Calcium 8.7 (*)    GFR, Estimated 41 (*)    All other components within normal limits  APTT - Abnormal; Notable for the following components:   aPTT 38 (*)    All other components within normal limits  URINALYSIS, ROUTINE W REFLEX MICROSCOPIC - Abnormal; Notable for the following components:   Hgb urine dipstick SMALL (*)    Protein, ur >300 (*)    All other components within normal limits  CULTURE, BLOOD (ROUTINE X 2)  CULTURE, BLOOD (ROUTINE X 2)  CBC  PROTIME-INR  LACTIC ACID, PLASMA    EKG EKG Interpretation  Date/Time:  Friday Apr 19 2021 20:44:17 EDT Ventricular Rate:  82 PR Interval:  232 QRS Duration: 124 QT Interval:  386 QTC Calculation: 451 R Axis:   -66 Text Interpretation: Sinus rhythm Prolonged PR interval Left bundle branch block NO SIGNIFICANT CHANGE SINCE LAST TRACING YESTERDAY Confirmed by Fredia Sorrow [phone]) on 04/19/2021 11:02:36 PM   Radiology CT Head Wo Contrast  Result Date: 04/19/2021 CLINICAL DATA:  Slurred speech EXAM: CT HEAD WITHOUT CONTRAST TECHNIQUE: Contiguous axial images were obtained from the base of the skull through the vertex without intravenous contrast. COMPARISON:  CT brain 01/29/2021 FINDINGS: Brain: No acute territorial infarction, hemorrhage or  intracranial mass. Chronic lacunar infarct within the right basal ganglia and white matter. Mild atrophy and chronic small vessel ischemic change of the white matter. Left anterior deep brain stimulator with tip similar in position. Stable ventricle size. Vascular: No hyperdense vessels.  Carotid vascular calcification. Skull: Normal. Negative for fracture or focal lesion. Sinuses/Orbits: Chronic nasal bone deformity. Other: None IMPRESSION: 1. No CT evidence for acute intracranial abnormality. Atrophy and mild chronic small vessel ischemic changes of the white matter. Chronic lacunar infarcts right basal ganglia and white matter 2. Similar positioning of left deep brain stimulator Electronically Signed   By: Donavan Foil M.D.   On: 04/19/2021 23:45   DG Chest Port 1 View  Result Date: 04/19/2021 CLINICAL DATA:  Slurred speech for 2 days with positive blood cultures, initial encounter EXAM: PORTABLE CHEST 1 VIEW COMPARISON:  01/29/2021 FINDINGS: Cardiac shadow is within normal limits. Stimulator pack is again noted on the right. The lungs are clear. Minimal scarring is noted in the left base. No bony abnormality is noted. IMPRESSION: No active disease. Electronically Signed   By: Inez Catalina M.D.   On: 04/19/2021 23:37    Procedures Procedures   CRITICAL CARE Performed by: Fredia Sorrow Total critical care time: 35 minutes Critical care time was exclusive of separately billable  procedures and treating other patients. Critical care was necessary to treat or prevent imminent or life-threatening deterioration. Critical care was time spent personally by me on the following activities: development of treatment plan with patient and/or surrogate as well as nursing, discussions with consultants, evaluation of patient's response to treatment, examination of patient, obtaining history from patient or surrogate, ordering and performing treatments and interventions, ordering and review of laboratory studies,  ordering and review of radiographic studies, pulse oximetry and re-evaluation of patient's condition.  Medications Ordered in ED Medications - No data to display  ED Course  I have reviewed the triage vital signs and the nursing notes.  Pertinent labs & imaging results that were available during my care of the patient were reviewed by me and considered in my medical decision making (see chart for details).    MDM Rules/Calculators/A&P                          We were unable on their version of my chart to find any blood cultures.  We repeated blood cultures here.  Lactic acid is normal at 0.9.  Urinalysis with some hematuria.  But not consistent with infection.  No leukocytosis.  Patient is not febrile temperature was 98.9.  Not tachycardic not hypotensive.  No septic parameters on vital signs.  Complete metabolic panel GFR 41 but not far off from his baseline.  No significant electrolyte abnormalities.  No liver function test abnormalities.  Anion gap was normal at 10.  Blood cultures done and are pending.  CT head without any acute findings.  But there was chronic lacunar infarcts right basilar ganglia.  Similar positioning of the left deep brain stimulator.  And this was placed for tremors.  Chest x-ray negative for any acute findings.  Discussed with the hospitalist Dr. Octavia Bruckner [place].  About the positive blood culture history.  Was able to listen to the voice message left when they were traveling from the doctor that the hospitalist implied that one of the cultures was positive and patient needed to go immediately to the hospital.  The hospitalist here is not recommending admission does not recommend any IV antibiotics.  But chest precautions and returning for any develop of anything that seems to be infectious. Final Clinical Impression(s) / ED Diagnoses Final diagnoses:  Cerebrovascular accident (CVA), unspecified mechanism ([place])  Positive blood culture    Rx / DC Orders ED Discharge  Orders    None       Fredia Sorrow, MD 04/20/21 [phone]

## 2021-04-21 ENCOUNTER — Other Ambulatory Visit: Payer: Self-pay

## 2021-04-21 ENCOUNTER — Encounter (HOSPITAL_COMMUNITY): Payer: Self-pay | Admitting: Emergency Medicine

## 2021-04-21 ENCOUNTER — Inpatient Hospital Stay (HOSPITAL_COMMUNITY): Payer: Medicare PPO

## 2021-04-21 ENCOUNTER — Inpatient Hospital Stay (HOSPITAL_COMMUNITY)
Admission: EM | Admit: 2021-04-21 | Discharge: 2021-04-24 | DRG: 951 | Disposition: A | Discharge status: Home / Self Care | Payer: Medicare PPO | Attending: Internal Medicine | Admitting: Internal Medicine

## 2021-04-21 ENCOUNTER — Telehealth (HOSPITAL_BASED_OUTPATIENT_CLINIC_OR_DEPARTMENT_OTHER): Payer: Self-pay | Admitting: Emergency Medicine

## 2021-04-21 DIAGNOSIS — G250 Essential tremor: Secondary | ICD-10-CM | POA: Diagnosis present

## 2021-04-21 DIAGNOSIS — B957 Other staphylococcus as the cause of diseases classified elsewhere: Secondary | ICD-10-CM

## 2021-04-21 DIAGNOSIS — F32A Depression, unspecified: Secondary | ICD-10-CM | POA: Diagnosis present

## 2021-04-21 DIAGNOSIS — I748 Embolism and thrombosis of other arteries: Secondary | ICD-10-CM | POA: Diagnosis present

## 2021-04-21 DIAGNOSIS — E1169 Type 2 diabetes mellitus with other specified complication: Secondary | ICD-10-CM | POA: Diagnosis not present

## 2021-04-21 DIAGNOSIS — I130 Hypertensive heart and chronic kidney disease with heart failure and stage 1 through stage 4 chronic kidney disease, or unspecified chronic kidney disease: Secondary | ICD-10-CM | POA: Diagnosis present

## 2021-04-21 DIAGNOSIS — R208 Other disturbances of skin sensation: Secondary | ICD-10-CM | POA: Diagnosis present

## 2021-04-21 DIAGNOSIS — N1831 Chronic kidney disease, stage 3a: Secondary | ICD-10-CM | POA: Diagnosis present

## 2021-04-21 DIAGNOSIS — Z9682 Presence of neurostimulator: Secondary | ICD-10-CM

## 2021-04-21 DIAGNOSIS — R0600 Dyspnea, unspecified: Secondary | ICD-10-CM | POA: Diagnosis present

## 2021-04-21 DIAGNOSIS — Z79899 Other long term (current) drug therapy: Secondary | ICD-10-CM

## 2021-04-21 DIAGNOSIS — Z0389 Encounter for observation for other suspected diseases and conditions ruled out: Secondary | ICD-10-CM | POA: Diagnosis not present

## 2021-04-21 DIAGNOSIS — I2510 Atherosclerotic heart disease of native coronary artery without angina pectoris: Secondary | ICD-10-CM | POA: Diagnosis present

## 2021-04-21 DIAGNOSIS — I6330 Cerebral infarction due to thrombosis of unspecified cerebral artery: Secondary | ICD-10-CM

## 2021-04-21 DIAGNOSIS — I422 Other hypertrophic cardiomyopathy: Secondary | ICD-10-CM | POA: Diagnosis present

## 2021-04-21 DIAGNOSIS — R29702 NIHSS score 2: Secondary | ICD-10-CM | POA: Diagnosis present

## 2021-04-21 DIAGNOSIS — I69398 Other sequelae of cerebral infarction: Secondary | ICD-10-CM | POA: Diagnosis not present

## 2021-04-21 DIAGNOSIS — Z888 Allergy status to other drugs, medicaments and biological substances status: Secondary | ICD-10-CM

## 2021-04-21 DIAGNOSIS — R4781 Slurred speech: Secondary | ICD-10-CM | POA: Diagnosis present

## 2021-04-21 DIAGNOSIS — Z86718 Personal history of other venous thrombosis and embolism: Secondary | ICD-10-CM

## 2021-04-21 DIAGNOSIS — Z6831 Body mass index (BMI) 31.0-31.9, adult: Secondary | ICD-10-CM

## 2021-04-21 DIAGNOSIS — I77810 Thoracic aortic ectasia: Secondary | ICD-10-CM | POA: Diagnosis present

## 2021-04-21 DIAGNOSIS — R911 Solitary pulmonary nodule: Secondary | ICD-10-CM | POA: Diagnosis present

## 2021-04-21 DIAGNOSIS — M109 Gout, unspecified: Secondary | ICD-10-CM | POA: Diagnosis present

## 2021-04-21 DIAGNOSIS — K219 Gastro-esophageal reflux disease without esophagitis: Secondary | ICD-10-CM | POA: Diagnosis present

## 2021-04-21 DIAGNOSIS — R7881 Bacteremia: Secondary | ICD-10-CM

## 2021-04-21 DIAGNOSIS — E1122 Type 2 diabetes mellitus with diabetic chronic kidney disease: Secondary | ICD-10-CM | POA: Diagnosis present

## 2021-04-21 DIAGNOSIS — I69320 Aphasia following cerebral infarction: Secondary | ICD-10-CM

## 2021-04-21 DIAGNOSIS — E669 Obesity, unspecified: Secondary | ICD-10-CM | POA: Diagnosis present

## 2021-04-21 DIAGNOSIS — Z8673 Personal history of transient ischemic attack (TIA), and cerebral infarction without residual deficits: Secondary | ICD-10-CM

## 2021-04-21 DIAGNOSIS — I639 Cerebral infarction, unspecified: Secondary | ICD-10-CM | POA: Diagnosis present

## 2021-04-21 DIAGNOSIS — Z87891 Personal history of nicotine dependence: Secondary | ICD-10-CM

## 2021-04-21 DIAGNOSIS — Z9989 Dependence on other enabling machines and devices: Secondary | ICD-10-CM

## 2021-04-21 DIAGNOSIS — Z7901 Long term (current) use of anticoagulants: Secondary | ICD-10-CM

## 2021-04-21 DIAGNOSIS — N189 Chronic kidney disease, unspecified: Secondary | ICD-10-CM | POA: Diagnosis present

## 2021-04-21 DIAGNOSIS — Z833 Family history of diabetes mellitus: Secondary | ICD-10-CM

## 2021-04-21 DIAGNOSIS — G4733 Obstructive sleep apnea (adult) (pediatric): Secondary | ICD-10-CM

## 2021-04-21 DIAGNOSIS — I82B22 Chronic embolism and thrombosis of left subclavian vein: Secondary | ICD-10-CM | POA: Diagnosis present

## 2021-04-21 DIAGNOSIS — E663 Overweight: Secondary | ICD-10-CM | POA: Diagnosis present

## 2021-04-21 DIAGNOSIS — I5032 Chronic diastolic (congestive) heart failure: Secondary | ICD-10-CM | POA: Diagnosis present

## 2021-04-21 DIAGNOSIS — Z96652 Presence of left artificial knee joint: Secondary | ICD-10-CM | POA: Diagnosis present

## 2021-04-21 DIAGNOSIS — E785 Hyperlipidemia, unspecified: Secondary | ICD-10-CM | POA: Diagnosis present

## 2021-04-21 DIAGNOSIS — Z86711 Personal history of pulmonary embolism: Secondary | ICD-10-CM | POA: Diagnosis not present

## 2021-04-21 DIAGNOSIS — Z9049 Acquired absence of other specified parts of digestive tract: Secondary | ICD-10-CM

## 2021-04-21 DIAGNOSIS — N1832 Chronic kidney disease, stage 3b: Secondary | ICD-10-CM

## 2021-04-21 DIAGNOSIS — Z20822 Contact with and (suspected) exposure to covid-19: Secondary | ICD-10-CM | POA: Diagnosis present

## 2021-04-21 DIAGNOSIS — Z85820 Personal history of malignant melanoma of skin: Secondary | ICD-10-CM

## 2021-04-21 DIAGNOSIS — H5462 Unqualified visual loss, left eye, normal vision right eye: Secondary | ICD-10-CM | POA: Diagnosis present

## 2021-04-21 DIAGNOSIS — Z794 Long term (current) use of insulin: Secondary | ICD-10-CM

## 2021-04-21 DIAGNOSIS — R531 Weakness: Secondary | ICD-10-CM | POA: Diagnosis present

## 2021-04-21 LAB — BLOOD CULTURE ID PANEL (REFLEXED) - BCID2

## 2021-04-21 LAB — CBC
HCT: 43.4 % (ref 39.0–52.0)
Hemoglobin: 13.5 g/dL (ref 13.0–17.0)
MCH: 29.3 pg (ref 26.0–34.0)
MCHC: 31.1 g/dL (ref 30.0–36.0)
MCV: 94.1 fL (ref 80.0–100.0)
Platelets: 230 10*3/uL (ref 150–400)
RBC: 4.61 MIL/uL (ref 4.22–5.81)
RDW: 13.2 % (ref 11.5–15.5)
WBC: 8.6 10*3/uL (ref 4.0–10.5)
nRBC: 0 % (ref 0.0–0.2)

## 2021-04-21 LAB — URINALYSIS, ROUTINE W REFLEX MICROSCOPIC
Bacteria, UA: NONE SEEN
Bilirubin Urine: NEGATIVE
Glucose, UA: 150 mg/dL — AB
Ketones, ur: NEGATIVE mg/dL
Leukocytes,Ua: NEGATIVE
Nitrite: NEGATIVE
Protein, ur: 100 mg/dL — AB
Specific Gravity, Urine: 1.018 (ref 1.005–1.030)
pH: 6 (ref 5.0–8.0)

## 2021-04-21 LAB — BASIC METABOLIC PANEL
Anion gap: 11 (ref 5–15)
BUN: 21 mg/dL (ref 8–23)
CO2: 23 mmol/L (ref 22–32)
Calcium: 9.3 mg/dL (ref 8.9–10.3)
Chloride: 107 mmol/L (ref 98–111)
Creatinine, Ser: 1.68 mg/dL — ABNORMAL HIGH (ref 0.61–1.24)
GFR, Estimated: 43 mL/min — ABNORMAL LOW (ref >60)
Glucose, Bld: 228 mg/dL — ABNORMAL HIGH (ref 70–99)
Potassium: 4.1 mmol/L (ref 3.5–5.1)
Sodium: 141 mmol/L (ref 135–145)

## 2021-04-21 LAB — GLUCOSE, CAPILLARY
Glucose-Capillary: 166 mg/dL — ABNORMAL HIGH (ref 70–99)
Glucose-Capillary: 203 mg/dL — ABNORMAL HIGH (ref 70–99)

## 2021-04-21 LAB — RESP PANEL BY RT-PCR (FLU A&B, COVID) ARPGX2
Influenza A by PCR: NEGATIVE
Influenza B by PCR: NEGATIVE
SARS Coronavirus 2 by RT PCR: NEGATIVE

## 2021-04-21 LAB — BRAIN NATRIURETIC PEPTIDE: B Natriuretic Peptide: 13 pg/mL (ref 0.0–100.0)

## 2021-04-21 LAB — CBG MONITORING, ED: Glucose-Capillary: 199 mg/dL — ABNORMAL HIGH (ref 70–99)

## 2021-04-21 MED ORDER — MIRTAZAPINE 15 MG PO TABS
7.5000 mg | ORAL_TABLET | Freq: Every day | ORAL | Status: DC
  Administered 2021-04-21 – 2021-04-23 (×3): 7.5 mg via ORAL
  Filled 2021-04-21 (×3): qty 1

## 2021-04-21 MED ORDER — INSULIN GLARGINE 100 UNIT/ML ~~LOC~~ SOLN
80.0000 [IU] | Freq: Two times a day (BID) | SUBCUTANEOUS | Status: DC
  Administered 2021-04-21 – 2021-04-24 (×5): 80 [IU] via SUBCUTANEOUS
  Filled 2021-04-21 (×7): qty 0.8

## 2021-04-21 MED ORDER — ACETAMINOPHEN 325 MG PO TABS
650.0000 mg | ORAL_TABLET | Freq: Four times a day (QID) | ORAL | Status: DC | PRN
  Administered 2021-04-23: 650 mg via ORAL
  Filled 2021-04-21: qty 2

## 2021-04-21 MED ORDER — ONDANSETRON HCL 4 MG PO TABS: 4.0000 mg | ORAL_TABLET | Freq: Four times a day (QID) | ORAL | Status: DC | PRN

## 2021-04-21 MED ORDER — APIXABAN 5 MG PO TABS: 5.0000 mg | ORAL_TABLET | Freq: Two times a day (BID) | ORAL | Status: DC

## 2021-04-21 MED ORDER — ALBUTEROL SULFATE (2.5 MG/3ML) 0.083% IN NEBU: 2.5000 mg | INHALATION_SOLUTION | Freq: Four times a day (QID) | RESPIRATORY_TRACT | Status: DC | PRN

## 2021-04-21 MED ORDER — INSULIN ASPART 100 UNIT/ML IJ SOLN
0.0000 [IU] | Freq: Three times a day (TID) | INTRAMUSCULAR | Status: DC
  Administered 2021-04-21: 3 [IU] via SUBCUTANEOUS
  Administered 2021-04-22: 2 [IU] via SUBCUTANEOUS
  Administered 2021-04-22: 3 [IU] via SUBCUTANEOUS
  Administered 2021-04-23: 2 [IU] via SUBCUTANEOUS
  Administered 2021-04-24: 3 [IU] via SUBCUTANEOUS

## 2021-04-21 MED ORDER — ONDANSETRON HCL 4 MG/2ML IJ SOLN: 4.0000 mg | Freq: Four times a day (QID) | INTRAMUSCULAR | Status: DC | PRN

## 2021-04-21 MED ORDER — CYCLOSPORINE 0.05 % OP EMUL
1.0000 [drp] | Freq: Two times a day (BID) | OPHTHALMIC | Status: DC | PRN
  Filled 2021-04-21: qty 1

## 2021-04-21 MED ORDER — DULOXETINE HCL 60 MG PO CPEP
60.0000 mg | ORAL_CAPSULE | Freq: Every day | ORAL | Status: DC
  Administered 2021-04-22 – 2021-04-24 (×3): 60 mg via ORAL
  Filled 2021-04-21: qty 2
  Filled 2021-04-21 (×2): qty 1

## 2021-04-21 MED ORDER — CEFAZOLIN SODIUM-DEXTROSE 2-4 GM/100ML-% IV SOLN
2.0000 g | Freq: Three times a day (TID) | INTRAVENOUS | Status: DC
  Administered 2021-04-21 – 2021-04-22 (×3): 2 g via INTRAVENOUS
  Filled 2021-04-21 (×5): qty 100

## 2021-04-21 MED ORDER — FENOFIBRATE 54 MG PO TABS
54.0000 mg | ORAL_TABLET | Freq: Every day | ORAL | Status: DC
  Administered 2021-04-22 – 2021-04-24 (×3): 54 mg via ORAL
  Filled 2021-04-21 (×3): qty 1

## 2021-04-21 MED ORDER — EZETIMIBE 10 MG PO TABS
10.0000 mg | ORAL_TABLET | Freq: Every day | ORAL | Status: DC
  Administered 2021-04-22 – 2021-04-24 (×3): 10 mg via ORAL
  Filled 2021-04-21 (×3): qty 1

## 2021-04-21 MED ORDER — SODIUM CHLORIDE 0.9% FLUSH
3.0000 mL | Freq: Two times a day (BID) | INTRAVENOUS | Status: DC
  Administered 2021-04-21 – 2021-04-24 (×6): 3 mL via INTRAVENOUS

## 2021-04-21 MED ORDER — ACETAMINOPHEN 650 MG RE SUPP: 650.0000 mg | Freq: Four times a day (QID) | RECTAL | Status: DC | PRN

## 2021-04-21 MED ORDER — POLYETHYL GLYCOL-PROPYL GLYCOL 0.4-0.3 % OP GEL: Freq: Four times a day (QID) | OPHTHALMIC | Status: DC | PRN

## 2021-04-21 MED ORDER — METOPROLOL TARTRATE 25 MG PO TABS
25.0000 mg | ORAL_TABLET | Freq: Two times a day (BID) | ORAL | Status: DC
  Administered 2021-04-21 – 2021-04-24 (×6): 25 mg via ORAL
  Filled 2021-04-21 (×3): qty 1
  Filled 2021-04-21: qty 2
  Filled 2021-04-21 (×2): qty 1

## 2021-04-21 MED ORDER — ENOXAPARIN SODIUM 120 MG/0.8ML IJ SOSY
115.0000 mg | PREFILLED_SYRINGE | Freq: Two times a day (BID) | INTRAMUSCULAR | Status: DC
  Administered 2021-04-21 – 2021-04-22 (×2): 115 mg via SUBCUTANEOUS
  Filled 2021-04-21 (×3): qty 0.76

## 2021-04-21 MED ORDER — COLCHICINE 0.6 MG PO TABS
0.6000 mg | ORAL_TABLET | Freq: Two times a day (BID) | ORAL | Status: DC
  Administered 2021-04-21 – 2021-04-24 (×6): 0.6 mg via ORAL
  Filled 2021-04-21 (×6): qty 1

## 2021-04-21 MED ORDER — ATORVASTATIN CALCIUM 80 MG PO TABS
80.0000 mg | ORAL_TABLET | Freq: Every day | ORAL | Status: DC
  Administered 2021-04-21 – 2021-04-23 (×3): 80 mg via ORAL
  Filled 2021-04-21 (×3): qty 1

## 2021-04-21 MED ORDER — ARTIFICIAL TEARS OPHTHALMIC OINT
TOPICAL_OINTMENT | Freq: Four times a day (QID) | OPHTHALMIC | Status: DC | PRN
  Filled 2021-04-21: qty 3.5

## 2021-04-21 MED ORDER — LOSARTAN POTASSIUM 50 MG PO TABS
100.0000 mg | ORAL_TABLET | Freq: Every day | ORAL | Status: DC
  Administered 2021-04-22 – 2021-04-24 (×3): 100 mg via ORAL
  Filled 2021-04-21 (×3): qty 2

## 2021-04-21 MED ORDER — ESCITALOPRAM OXALATE 20 MG PO TABS
20.0000 mg | ORAL_TABLET | Freq: Every day | ORAL | Status: DC
  Administered 2021-04-22 – 2021-04-24 (×3): 20 mg via ORAL
  Filled 2021-04-21 (×3): qty 1

## 2021-04-21 MED ORDER — AMLODIPINE BESYLATE 10 MG PO TABS
10.0000 mg | ORAL_TABLET | Freq: Every day | ORAL | Status: DC
  Administered 2021-04-21 – 2021-04-23 (×3): 10 mg via ORAL
  Filled 2021-04-21 (×3): qty 1

## 2021-04-21 NOTE — ED Notes (Signed)
This RN attempted to call report to [facility] & they said "We haven't had time to look at that patient yet, we are a little busy." Report had been attempted 25 minutes prior and the same was said.

## 2021-04-21 NOTE — Progress Notes (Signed)
Pt has a DBS that is not undergoing the impedence check. It is stating, "impedence out of range," keeping it from proceeding into MR Mode. Do to this, we are unable to proceed with the scan due to the pt safety.

## 2021-04-21 NOTE — H&P (Addendum)
History and Physical    Joshua [name]:[id number] DOB: Jun 10, 1951 DOA: 04/21/2021  Referring MD/NP/PA: Aletta Edouard, MD PCP: [name], MD  Patient coming from: Home  Chief Complaint: Positive blood cultures  I have personally briefly reviewed patient's old medical records in [place]   HPI: Joshua [name] is a 70 y.o. male with medical history significant of hypertension, hypertrophic cardiomyopathy, cardioembolic CVA on Eliquis, diabetes mellitus type 2, PE after surgery, essential tremor s/p deep brain stimulator 2020, OSA on CPAP who presents after being instructed to come back to the hospital for possible blood cultures.  Patient had been packing up to return from [place] where him and his wife had been visiting his son 4 days ago when he first noticed something was not right.  At around 8 AM his speech became very slurred and he felt more off balance and weak on his right side.  He was taken to [facility] where it was determined that he likely had another stroke.  At that time he did not have the device to cut off his deep brain stimulator and therefore they were unable to do an MRI.  Reportedly had a echocardiogram have been performed at that time and they were discharged the next day had been recommended to follow-up with his cardiologist.  However, as they were driving back home patient was notified that his blood cultures were positive.  [facility] went immediately [facility], but patient was afebrile and labs were otherwise unremarkable.  Blood cultures were redrawn and recommended to follow-up with their primary.  Since being home patient reports that his speech is still slurred and he is having weakness on his right side.  He has right foot drop at baseline, but since the stroke it has been worse.  Also reports having some shortness of breath.  Denies having any fevers, cough, change in vision, chest pain, nausea, vomiting, or diarrhea  symptoms.  ED Course: Upon admission into the emergency department patient was seen to be afebrile, pulse 105, respirations 22, blood pressure 152/111, and O2 saturations maintained on room air.  Labs significant for BUN 21, creatinine 1.68, and glucose 228. blood cultures were rechecked and case was discussed with Dr. Baxter Flattery of infectious disease who recommended starting patient on Ancef 2 g [name] 8 hours.  TRH called to admit.  Review of Systems  Constitutional: Negative for fever.  HENT: Negative for congestion.   Eyes: Negative for redness.  Respiratory: Positive for shortness of breath.   Cardiovascular: Negative for chest pain and leg swelling.  Gastrointestinal: Negative for abdominal pain, nausea and vomiting.  Genitourinary: Negative for dysuria and hematuria.  Musculoskeletal: Negative for joint pain and myalgias.  Neurological: Positive for speech change and focal weakness. Negative for headaches.  Psychiatric/Behavioral: Negative for substance abuse.  All other systems reviewed and are negative.   Past Medical History:  Diagnosis Date  . Benign essential tremor   . Benign positional vertigo   . CVA (cerebral vascular accident) ([place])    x2 - L retina, 1 right parietal  . Degenerative arthritis   . Depression   . Diabetes mellitus   . DVT (deep venous thrombosis) ([place]) 2018  . Dyslipidemia   . GERD (gastroesophageal reflux disease)    hiatal hernia  . Gout   . H/O: vasectomy   . Hearing aid worn    b/l  . Hx of appendectomy   . Hx of tonsillectomy   . Hypertension   .  Hypertrophic cardiomyopathy ([place])   . 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 Melanoma ([place])   . NSVT (nonsustained ventricular tachycardia) (HCC)    1 4 beat run on event monitor in 07/2020  . Obesity   . OSA on CPAP    setting = 5  . Pulmonary emboli ([place]) 2018  . PVC's (premature ventricular contractions)   . SVT (supraventricular tachycardia) (HCC)    by event monitor  . Tremor,  essential 06/22/2017  . Wears glasses     Past Surgical History:  Procedure Laterality Date  . APPENDECTOMY    . arthroscopic knee surgery Bilateral   . CATARACT EXTRACTION Bilateral   . COLONOSCOPY    . MINOR PLACEMENT OF FIDUCIAL N/A 06/30/2019   Procedure: Fiducial placement;  Surgeon: Erline Levine, MD;  Location: [facility];  Service: Neurosurgery;  Laterality: N/A;  Fiducial placement  . NASAL SEPTUM SURGERY    . PULSE GENERATOR IMPLANT N/A 07/14/2019   Procedure: Left cranial Implanted Pulse Generator and lead extension placement to right chest ;  Surgeon: Erline Levine, MD;  Location: [facility];  Service: Neurosurgery;  Laterality: N/A;  . SUBTHALAMIC STIMULATOR INSERTION Left 07/07/2019   Procedure: LEFT DEEP BRAIN STIMULATOR PLACEMENT;  Surgeon: Erline Levine, MD;  Location: [facility];  Service: Neurosurgery;  Laterality: Left;  . TONSILLECTOMY    . TOTAL KNEE ARTHROPLASTY Left 03/30/2017   Procedure: LEFT TOTAL KNEE ARTHROPLASTY;  Surgeon: Paralee Cancel, MD;  Location: [facility];  Service: Orthopedics;  Laterality: Left;  . WISDOM TOOTH EXTRACTION       reports that he quit smoking about 37 years ago. His smoking use included cigarettes. He has a 10.00 pack-year smoking history. He has never used smokeless tobacco. He reports current alcohol use of about 2.0 standard drinks of alcohol per week. He reports that he does not use drugs.  Allergies  Allergen Reactions  . Melatonin Other (See Comments)    dizzy    Family History  Problem Relation Age of Onset  . Cerebral aneurysm Mother   . Tremor Mother   . Alzheimer's disease Father   . Tremor Brother   . Tremor Maternal Uncle   . Diabetes Maternal Grandmother   . Healthy Son   . Colon cancer Neg Hx     Prior to Admission medications   Medication Sig Start Date End Date Taking? Authorizing Provider  amLODipine (NORVASC) 10 MG tablet Take 1 tablet (10 mg total) by mouth at bedtime. 10/31/20   Donato Heinz, MD  apixaban  (ELIQUIS) 5 MG TABS tablet Take 1 tablet (5 mg total) by mouth 2 (two) times daily. 03/26/21   Donato Heinz, MD  atorvastatin (LIPITOR) 80 MG tablet Take 1 tablet (80 mg total) by mouth daily at 6 PM. 08/31/19   Danford, Suann Larry, MD  colchicine 0.6 MG tablet Take 1 tablet (0.6 mg total) by mouth 2 (two) times daily. 02/02/21   Shawna Clamp, MD  DULoxetine (CYMBALTA) 60 MG capsule Take 60 mg by mouth daily.    [provider]  escitalopram (LEXAPRO) 20 MG tablet 20 mg. 03/25/20   [provider]  ezetimibe (ZETIA) 10 MG tablet Take 1 tablet (10 mg total) by mouth daily. 02/26/21 05/27/21  Donato Heinz, MD  fenofibrate 54 MG tablet Take 54 mg by mouth daily.    [provider]  fluorometholone (FML) 0.1 % ophthalmic suspension fluorometholone 0.1 % eye drops,suspension    [provider]  furosemide (LASIX) 40 MG tablet Take 1 tablet (40 mg total) by mouth as needed (Weight increase of 3 pounds in 1 day or 5 pounds in 1 week.). 03/26/21   Donato Heinz, MD  Insulin Glargine, 1 Unit Dial, (TOUJEO SOLOSTAR) 300 UNIT/ML SOPN Inject 85 Units into the skin 2 (two) times daily.    [provider]  insulin lispro (HUMALOG) 100 UNIT/ML KwikPen Inject 15-25 Units into the skin See admin instructions. Inject 15 units subcutaneously after breakfast and 25 units after lunch and supper    [provider]  losartan (COZAAR) 100 MG tablet Take 1 tablet by mouth daily. 03/25/20   [provider]  Lutein-Zeaxanthin 25-5 MG CAPS Take 1 tablet by mouth daily.    [provider]  metFORMIN (GLUCOPHAGE) 500 MG tablet 500 mg. 03/25/20   [provider]  metoprolol tartrate (LOPRESSOR) 25 MG tablet Take 1 tablet (25 mg total) by mouth 2 (two) times daily. 04/09/21   Donato Heinz, MD  mirtazapine (REMERON) 7.5 MG tablet Take 7.5 mg by mouth at bedtime.    [provider]  Multiple Vitamin (MULTIVITAMIN  WITH MINERALS) TABS tablet Take 1 tablet by mouth daily.    [provider]  Omega-3 Fatty Acids (FISH OIL) 1000 MG CAPS Take 2,000 mg by mouth daily. Reported on 02/13/2016    [provider]  [facility] VERIO test strip  09/13/18   [provider]  Polyethyl Glycol-Propyl Glycol (SYSTANE OP) Place 1-2 drops into both eyes 4 (four) times daily as needed (dry eyes).     [provider]  PRESCRIPTION MEDICATION Inhale into the lungs at bedtime. CPAP    [provider]  RESTASIS 0.05 % ophthalmic emulsion 1 drop 2 (two) times daily. 01/29/21   [provider]  SURE COMFORT PEN NEEDLES 31G X 5 MM [place]  09/12/19   [provider]    Physical Exam:  Constitutional: Elderly male who appears to be in some respiratory distress Vitals:   04/21/21 1223  BP: (!) 152/111  Pulse: (!) 105  Resp: (!) 22  Temp: 98.7 F (37.1 C)  SpO2: 100%   Eyes: PERRL, lids and conjunctivae normal ENMT: Mucous membranes are moist. Posterior pharynx clear of any exudate or lesions.  Neck: normal, supple, no masses, no thyromegaly.  No JVD. Respiratory: Mildly tachypneic with bibasilar crackles.  No significant wheezing or rhonchi appreciated.  O2 saturations currently maintained on room air. Cardiovascular: Tachycardic, positive systolic murmur.  Trace lower extremity edema. 2+ pedal pulses. No carotid bruits.  Abdomen: no tenderness, no masses palpated. No hepatosplenomegaly. Bowel sounds positive.  Musculoskeletal: no clubbing / cyanosis. No joint deformity upper and lower extremities. Good ROM, no contractures. Normal muscle tone.  Skin: no rashes, lesions, ulcers. No induration Neurologic: CN 2-12 grossly intact.  Right-sided weakness appreciated with slurred speech. Psychiatric: Normal judgment and insight. Alert and oriented x 3. Normal mood.     Labs on Admission: I have personally reviewed following labs and imaging studies  CBC: Recent Labs   Lab 04/19/21 2112 04/21/21 1233  WBC 9.3 8.6  NEUTROABS 3.8  --   HGB 13.3 13.5  HCT 40.0 43.4  MCV 89.7 94.1  PLT 231 867   Basic Metabolic Panel: Recent Labs  Lab 04/19/21 2112 04/21/21 1233  NA 142 141  K 3.6 4.1  CL 106 107  CO2 26 23  GLUCOSE 87 228*  BUN 29* 21  CREATININE 1.75* 1.68*  CALCIUM 8.7*  9.3   GFR: Estimated Creatinine Clearance: 55.6 mL/min (A) (by C-G formula based on SCr of 1.68 mg/dL (H)). Liver Function Tests: Recent Labs  Lab 04/19/21 2112  AST 29  ALT 33  ALKPHOS 77  BILITOT 0.4  PROT 6.5  ALBUMIN 3.9   No results for input(s): LIPASE, AMYLASE in the last 168 hours. No results for input(s): AMMONIA in the last 168 hours. Coagulation Profile: Recent Labs  Lab 04/19/21 2120  INR 1.0   Cardiac Enzymes: No results for input(s): CKTOTAL, CKMB, CKMBINDEX, TROPONINI in the last 168 hours. BNP (last 3 results) No results for input(s): PROBNP in the last 8760 hours. HbA1C: No results for input(s): HGBA1C in the last 72 hours. CBG: No results for input(s): GLUCAP in the last 168 hours. Lipid Profile: No results for input(s): CHOL, HDL, LDLCALC, TRIG, CHOLHDL, LDLDIRECT in the last 72 hours. Thyroid Function Tests: No results for input(s): TSH, T4TOTAL, FREET4, T3FREE, THYROIDAB in the last 72 hours. Anemia Panel: No results for input(s): VITAMINB12, FOLATE, FERRITIN, TIBC, IRON, RETICCTPCT in the last 72 hours. Urine analysis:    Component Value Date/Time   COLORURINE YELLOW 04/19/2021 2304   APPEARANCEUR CLEAR 04/19/2021 2304   LABSPEC 1.019 04/19/2021 2304   PHURINE 5.5 04/19/2021 2304   GLUCOSEU NEGATIVE 04/19/2021 2304   HGBUR SMALL (A) 04/19/2021 2304   BILIRUBINUR NEGATIVE 04/19/2021 2304   KETONESUR NEGATIVE 04/19/2021 2304   PROTEINUR >300 (A) 04/19/2021 2304   UROBILINOGEN 0.2 04/08/2011 0732   NITRITE NEGATIVE 04/19/2021 2304   LEUKOCYTESUR NEGATIVE 04/19/2021 2304   Sepsis Labs: Recent Results (from the past 240  hour(s))  Culture, blood (Routine X 2) w Reflex to ID Panel     Status: None (Preliminary result)   Collection Time: 04/19/21  8:50 PM   Specimen: BLOOD  Result Value Ref Range Status   Specimen Description   Final    BLOOD BLOOD LEFT FOREARM Performed at [facility], [address], [place], [place] [address]    Special Requests   Final    BOTTLES DRAWN AEROBIC AND ANAEROBIC Blood Culture adequate volume Performed at [facility], [address] [place], [place] [address]    Culture   Final    NO GROWTH < 24 HOURS Performed at [facility], [place] [address] [place], [place] [address]    Report Status PENDING  Incomplete  Culture, blood (Routine X 2) w Reflex to ID Panel     Status: None (Preliminary result)   Collection Time: 04/19/21 11:20 PM   Specimen: BLOOD  Result Value Ref Range Status   Specimen Description   Final    BLOOD RIGHT HAND Performed at [facility], [address], [place], [place] [address]    Special Requests   Final    BOTTLES DRAWN AEROBIC AND ANAEROBIC Blood Culture adequate volume Performed at [facility], [address], [place], [place] [address]    Culture  Setup Time   Final    GRAM POSITIVE COCCI IN CLUSTERS AEROBIC BOTTLE ONLY CRITICAL RESULT CALLED TO, READ BACK BY AND VERIFIED WITH: Camie Patience RN @0911  04/21/21 EB Performed at [facility], [place] [address] [place],  [address]    Culture [facility] POSITIVE COCCI  Final   Report Status PENDING  Incomplete  Blood Culture ID Panel (Reflexed)     Status: Abnormal   Collection Time: 04/19/21 11:20 PM  Result Value Ref Range Status   Enterococcus faecalis NOT DETECTED  NOT DETECTED Final   Enterococcus Faecium NOT DETECTED NOT DETECTED Final   Listeria monocytogenes NOT DETECTED NOT DETECTED Final   Staphylococcus species DETECTED (A) NOT DETECTED Final    Comment: CRITICAL RESULT CALLED TO, READ  BACK BY AND VERIFIED WITH: JERRI TEGRAM RN @0911  04/21/21 EB    Staphylococcus aureus (BCID) NOT DETECTED NOT DETECTED Final   Staphylococcus epidermidis NOT DETECTED NOT DETECTED Final   Staphylococcus lugdunensis NOT DETECTED NOT DETECTED Final   Streptococcus species NOT DETECTED NOT DETECTED Final   Streptococcus agalactiae NOT DETECTED NOT DETECTED Final   Streptococcus pneumoniae NOT DETECTED NOT DETECTED Final   Streptococcus pyogenes NOT DETECTED NOT DETECTED Final   A.calcoaceticus-baumannii NOT DETECTED NOT DETECTED Final   Bacteroides fragilis NOT DETECTED NOT DETECTED Final   Enterobacterales NOT DETECTED NOT DETECTED Final   Enterobacter cloacae complex NOT DETECTED NOT DETECTED Final   Escherichia coli NOT DETECTED NOT DETECTED Final   Klebsiella aerogenes NOT DETECTED NOT DETECTED Final   Klebsiella oxytoca NOT DETECTED NOT DETECTED Final   Klebsiella pneumoniae NOT DETECTED NOT DETECTED Final   Proteus species NOT DETECTED NOT DETECTED Final   Salmonella species NOT DETECTED NOT DETECTED Final   Serratia marcescens NOT DETECTED NOT DETECTED Final   Haemophilus influenzae NOT DETECTED NOT DETECTED Final   Neisseria meningitidis NOT DETECTED NOT DETECTED Final   Pseudomonas aeruginosa NOT DETECTED NOT DETECTED Final   Stenotrophomonas maltophilia NOT DETECTED NOT DETECTED Final   Candida albicans NOT DETECTED NOT DETECTED Final   Candida auris NOT DETECTED NOT DETECTED Final   Candida glabrata NOT DETECTED NOT DETECTED Final   Candida krusei NOT DETECTED NOT DETECTED Final   Candida parapsilosis NOT DETECTED NOT DETECTED Final   Candida tropicalis NOT DETECTED NOT DETECTED Final   Cryptococcus neoformans/gattii NOT DETECTED NOT DETECTED Final    Comment: Performed at [facility], [address] [place], [place] [address]     Radiological Exams on Admission: CT Head Wo Contrast  Result Date: 04/19/2021 CLINICAL DATA:  Slurred speech EXAM: CT HEAD WITHOUT  CONTRAST TECHNIQUE: Contiguous axial images were obtained from the base of the skull through the vertex without intravenous contrast. COMPARISON:  CT brain 01/29/2021 FINDINGS: Brain: No acute territorial infarction, hemorrhage or intracranial mass. Chronic lacunar infarct within the right basal ganglia and white matter. Mild atrophy and chronic small vessel ischemic change of the white matter. Left anterior deep brain stimulator with tip similar in position. Stable ventricle size. Vascular: No hyperdense vessels.  Carotid vascular calcification. Skull: Normal. Negative for fracture or focal lesion. Sinuses/Orbits: Chronic nasal bone deformity. Other: None IMPRESSION: 1. No CT evidence for acute intracranial abnormality. Atrophy and mild chronic small vessel ischemic changes of the white matter. Chronic lacunar infarcts right basal ganglia and white matter 2. Similar positioning of left deep brain stimulator Electronically Signed   By: Donavan Foil M.D.   On: 04/19/2021 23:45   DG Chest Port 1 View  Result Date: 04/19/2021 CLINICAL DATA:  Slurred speech for 2 days with positive blood cultures, initial encounter EXAM: PORTABLE CHEST 1 VIEW COMPARISON:  01/29/2021 FINDINGS: Cardiac shadow is within normal limits. Stimulator pack is again noted on the right. The lungs are clear. Minimal scarring is noted in the left base. No bony abnormality is noted. IMPRESSION: No active disease. Electronically Signed   By: Inez Catalina M.D.   On: 04/19/2021 23:37    EKG: Independently reviewed.Sinus rhythm 100 bpm with first-degree heart block   Assessment/Plan  Staphylococcal bacteremia: Acute.  Patient had recently had blood cultures initially drawn on 5/26 which were reported to be positive while patient was in [facility].  He was seen at [facility] [place] to a cardiac telemetry -Orders placed to obtain records from [facility] hospitalization -Continue Ancef IV [name] 8 hours -Cardiology  consulted for need of TEE -Appreciate ID consultative services, we will follow-up for any further recommendation  Recent CVA on Eliquis: Prior to arrival.  On 5/26 patient reported having worsening slurred speech with worsening right-sided weakness.  He has a prior history of cardioembolic stroke suspecting -Neuro check  -PT/OT/speech evaluate and treat -Hold Eliquis and started Lovenox per pharmacy in preparation for TEE  Dyspnea: Patient was noted to be dyspneic on physical exam although O2 saturations are maintained on room air. He does not appear grossly fluid overloaded at this time.  There is possible concern for septic emboli however at this time patient is on antibiotics and anticoagulation.  -Continue to monitor.  Did not order CT angiogram due to kidney function or VQ scan as patient is hemodynamically stable and a it would not change his current management  Diastolic CHF: Chronic.  Patient does not appear to be time.  Last echocardiogram on 03/08/2021 revealed EF of 55 to 60% with grade 1 diastolic dysfunction noted GLS pattern and marked LVH suggestive of cardiac amyloidosis. -Strict intake and output -Daily weights  Essential hypertension: Home blood pressure medications include amlodipine 10 mg nightly, losartan 100 mg daily, metoprolol 25 mg twice daily, furosemide 40 mg  as needed for weight gain. -Continue amlodipine, losartan, and metoprolol  Diabetes mellitus type 2: On admission patient presented with glucose elevated up to 228.  Home insulin regimen includes metformin 500 mg daily, glargine 80 units twice daily, and Humalog 15 units breakfast/25 units lunch and supper. -Hypoglycemic protocol -Hold metformin and Humalog -Continue home acting regimen -CBGs before [name] meal with moderate SSI  Dyslipidemia -Continue atorvastatin, Zetia, and fenofibrate  Chronic kidney disease stage IIIb: Stable creatinine 1.68 which appears around patient's baseline. -Continue to monitor  kidney function  Essential tremor s/p deep brain stimulator  History of GN:FAOZH surgery  OSA -Continue CPAP nightly  Obesity: BMI 30.43 kg/m  DVT prophylaxis: lovenox Code Status: Full  Family Communication: Wife updated at bedside Disposition Plan: Home Consults called: Cardiology, ID Admission status: Inpatient, require more than 2 midnight stay due to need of IV antibiotic  Norval Morton MD [facility]   If 7PM-7AM, please contact night-coverage   04/21/2021, 2:51 PM

## 2021-04-21 NOTE — ED Provider Notes (Signed)
Blood culture positive for Staph. Was in ED due to positive blood cultures in [place]--had them redrawn and after consultation with hospitalist was discharged.  Given repeat positive blood cultures, staph species. Recommend repeat cx, admission, empiric abx.  Discussed with Dr. Baxter Flattery.   Gareth Morgan, MD 04/21/21 [phone]

## 2021-04-21 NOTE — Progress Notes (Signed)
ANTICOAGULATION CONSULT NOTE - Initial Consult  Pharmacy Consult for enoxaparin Indication: History of cardioembolic stroke and need of TEE holding Eliquis  Allergies  Allergen Reactions  . Melatonin Other (See Comments)    dizzy    Patient Measurements: Height: 6\' 4"  (193 cm) Weight: 115.8 kg (255 lb 4.7 oz) IBW/kg (Calculated) : 86.8 Heparin Dosing Weight: 110.7 kg  Vital Signs: Temp: 98.4 F (36.9 C) (05/29 1652) Temp Source: Oral (05/29 1631) BP: 164/88 (05/29 1652) Pulse Rate: 85 (05/29 1652)  Labs: Recent Labs    04/19/21 2112 04/19/21 2120 04/21/21 1233  HGB 13.3  --  13.5  HCT 40.0  --  43.4  PLT 231  --  230  APTT  --  38*  --   LABPROT  --  13.4  --   INR  --  1.0  --   CREATININE 1.75*  --  1.68*    Estimated Creatinine Clearance: 56.1 mL/min (A) (by C-G formula based on SCr of 1.68 mg/dL (H)).   Medical History: Past Medical History:  Diagnosis Date  . Benign essential tremor   . Benign positional vertigo   . CVA (cerebral vascular accident) ([place])    x2 - L retina, 1 right parietal  . Degenerative arthritis   . Depression   . Diabetes mellitus   . DVT (deep venous thrombosis) ([place]) 2018  . Dyslipidemia   . GERD (gastroesophageal reflux disease)    hiatal hernia  . Gout   . H/O: vasectomy   . Hearing aid worn    b/l  . Hx of appendectomy   . Hx of tonsillectomy   . Hypertension   . Hypertrophic cardiomyopathy ([place])   . 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 Melanoma ([place])   . NSVT (nonsustained ventricular tachycardia) ([facility])    1 4 beat run on event monitor in 07/2020  . Obesity   . OSA on CPAP    setting = 5  . Pulmonary emboli ([place]) 2018  . PVC's (premature ventricular contractions)   . SVT (supraventricular tachycardia) (HCC)    by event monitor  . Tremor, essential 06/22/2017  . Wears glasses     Medications:  Scheduled:  . amLODipine  10 mg Oral QHS  . atorvastatin  80 mg Oral q1800  . colchicine  0.6 mg Oral BID   . [START ON 04/22/2021] DULoxetine  60 mg Oral Daily  . [START ON 04/22/2021] escitalopram  20 mg Oral Daily  . [START ON 04/22/2021] ezetimibe  10 mg Oral Daily  . [START ON 04/22/2021] fenofibrate  54 mg Oral Daily  . insulin aspart  0-15 Units Subcutaneous TID WC  . insulin glargine  80 Units Subcutaneous BID  . [START ON 04/22/2021] losartan  100 mg Oral Daily  . metoprolol tartrate  25 mg Oral BID  . mirtazapine  7.5 mg Oral QHS  . sodium chloride flush  3 mL Intravenous Q12H    Assessment: 20 yof who had blood cultures drawn when presented to OSH with slurred speech, weakness, and off balance. Once noticed blood cultures positive, had repeats drawn at [facility] - found to have MSSA bacteremia. On apixaban PTA for hx cardioembolic CVA (LD 5/29@0845 ).   Hgb 13.5, plt 230. No s/sx of bleeding. Scr 1.68 (CrCl 55 mL/min). Plan for TEE so MD requesting change to enoxaparin. Will start 12 hours after last apixaban dose.   Goal of Therapy:  Anti-Xa level 0.6-1 units/ml 4hrs after LMWH  dose given Monitor platelets by anticoagulation protocol: Yes   Plan:  Will order enoxaparin 115 mg every 12 hours starting on 5/29@2100  Monitor CBC, and for s/sx of bleeding   Antonietta Jewel, PharmD, [place] Pharmacist  Phone: [phone] 04/21/2021 7:15 PM  Please check AMION for all [place] phone numbers After 10:00 PM, call [place] [phone]

## 2021-04-21 NOTE — ED Provider Notes (Signed)
[facility] EMERGENCY DEPARTMENT Provider Note   CSN: [id number] Arrival date & time: 04/21/21  1213     History Chief Complaint  Patient presents with  . + blood culture    Joshua [name] is a 70 y.o. male.  He has had at least 2 strokes in the past that have left him with decreased central vision on the left.  He also has essential tremor and has a nerve stimulator which helps with the tremor on the right side of his body.  He was in [place] on the 25th of this month when he experienced some garbled speech.  He was seen at a hospital there and discharged the next day.  He feels his speech is improved although not completely.  Is noticing some right grip weakness and a little bit more unsteadiness on his feet.  During travel home he received a phone call that he had a positive blood culture there.  It is unclear why blood cultures were drawn because patient denies any fever or illness symptoms.  He went to [facility] yesterday and had another infectious work-up, unremarkable and no symptoms and was discharged.  Today he received a phone call that he had positive blood cultures.  He still denies any illness symptoms.  No headache change in vision cough shortness of breath nausea vomiting diarrhea or urinary symptoms.  No rashes or swollen joints.  No fevers or chills.        Past Medical History:  Diagnosis Date  . Benign essential tremor   . Benign positional vertigo   . CVA (cerebral vascular accident) ([place])    x2 - L retina, 1 right parietal  . Degenerative arthritis   . Depression   . Diabetes mellitus   . DVT (deep venous thrombosis) ([place]) 2018  . Dyslipidemia   . GERD (gastroesophageal reflux disease)    hiatal hernia  . Gout   . H/O: vasectomy   . Hearing aid worn    b/l  . Hx of appendectomy   . Hx of tonsillectomy   . Hypertension   . Hypertrophic cardiomyopathy ([place])   . 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 Melanoma ([place])    . NSVT (nonsustained ventricular tachycardia) ([facility])    1 4 beat run on event monitor in 07/2020  . Obesity   . OSA on CPAP    setting = 5  . Pulmonary emboli ([place]) 2018  . PVC's (premature ventricular contractions)   . SVT (supraventricular tachycardia) ([facility])    by event monitor  . Tremor, essential 06/22/2017  . Wears glasses     Patient Active Problem List   Diagnosis Date Noted  . DOE (dyspnea on exertion) 01/30/2021  . Acute hypoxemic respiratory failure ([place]) 01/29/2021  . Bilateral hearing loss 05/15/2020  . Bilateral impacted cerumen 05/15/2020  . Fungal otitis externa 05/15/2020  . Cerebral embolism with cerebral infarction 08/29/2019  . 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 Hyperlipidemia 08/28/2019  . Chronic kidney disease 08/28/2019  . Obesity (BMI 30-39.9) 08/28/2019  . Tremor 07/07/2019  . Essential tremor 06/22/2017  . OSA on CPAP 04/10/2017  . Essential hypertension 04/10/2017  . Pulmonary thromboembolism ([place]) 04/10/2017  . PE (pulmonary thromboembolism) ([place]) 04/09/2017  . S/P left TKA 03/30/2017  . S/P total knee replacement 03/30/2017  . Cellulitis of left foot 03/07/2016  . Pre-ulcerative calluses 03/07/2016  . Type 2 diabetes mellitus with left diabetic foot ulcer ([place]) 01/14/2016  .  Diabetes mellitus type 2 in obese ([place]) 08/07/2008  . GERD 08/07/2008    Past Surgical History:  Procedure Laterality Date  . APPENDECTOMY    . arthroscopic knee surgery Bilateral   . CATARACT EXTRACTION Bilateral   . COLONOSCOPY    . MINOR PLACEMENT OF FIDUCIAL N/A 06/30/2019   Procedure: Fiducial placement;  Surgeon: Erline Levine, MD;  Location: [facility];  Service: Neurosurgery;  Laterality: N/A;  Fiducial placement  . NASAL SEPTUM SURGERY    . PULSE GENERATOR IMPLANT N/A 07/14/2019   Procedure: Left cranial Implanted Pulse Generator and lead extension placement to right chest ;  Surgeon: Erline Levine, MD;  Location: [facility];  Service: Neurosurgery;   Laterality: N/A;  . SUBTHALAMIC STIMULATOR INSERTION Left 07/07/2019   Procedure: LEFT DEEP BRAIN STIMULATOR PLACEMENT;  Surgeon: Erline Levine, MD;  Location: [facility];  Service: Neurosurgery;  Laterality: Left;  . TONSILLECTOMY    . TOTAL KNEE ARTHROPLASTY Left 03/30/2017   Procedure: LEFT TOTAL KNEE ARTHROPLASTY;  Surgeon: Paralee Cancel, MD;  Location: [facility];  Service: Orthopedics;  Laterality: Left;  . WISDOM TOOTH EXTRACTION         Family History  Problem Relation Age of Onset  . Cerebral aneurysm Mother   . Tremor Mother   . Alzheimer's disease Father   . Tremor Brother   . Tremor Maternal Uncle   . Diabetes Maternal Grandmother   . Healthy Son   . Colon cancer Neg Hx     Social History   Tobacco Use  . Smoking status: Former Smoker    Packs/day: 1.00    Years: 10.00    Pack years: 10.00    Types: Cigarettes    Quit date: 11/25/1983    Years since quitting: 37.4  . Smokeless tobacco: Never Used  Vaping Use  . Vaping Use: Never used  Substance Use Topics  . Alcohol use: Yes    Alcohol/week: 2.0 standard drinks    Types: 2 Standard drinks or equivalent per week    Comment: occassionally  . Drug use: No    Home Medications Prior to Admission medications   Medication Sig Start Date End Date Taking? Authorizing Provider  amLODipine (NORVASC) 10 MG tablet Take 1 tablet (10 mg total) by mouth at bedtime. 10/31/20   Donato Heinz, MD  apixaban (ELIQUIS) 5 MG TABS tablet Take 1 tablet (5 mg total) by mouth 2 (two) times daily. 03/26/21   Donato Heinz, MD  atorvastatin (LIPITOR) 80 MG tablet Take 1 tablet (80 mg total) by mouth daily at 6 PM. 08/31/19   Danford, Suann Larry, MD  colchicine 0.6 MG tablet Take 1 tablet (0.6 mg total) by mouth 2 (two) times daily. 02/02/21   Shawna Clamp, MD  DULoxetine (CYMBALTA) 60 MG capsule Take 60 mg by mouth daily.    [provider]  escitalopram (LEXAPRO) 20 MG tablet 20 mg. 03/25/20   [provider]   ezetimibe (ZETIA) 10 MG tablet Take 1 tablet (10 mg total) by mouth daily. 02/26/21 05/27/21  Donato Heinz, MD  fenofibrate 54 MG tablet Take 54 mg by mouth daily.    [provider]  fluorometholone (FML) 0.1 % ophthalmic suspension fluorometholone 0.1 % eye drops,suspension    [provider]  furosemide (LASIX) 40 MG tablet Take 1 tablet (40 mg total) by mouth as needed (Weight increase of 3 pounds in 1 day or 5 pounds in 1 week.). 03/26/21   Donato Heinz, MD  Insulin  Glargine, 1 Unit Dial, (TOUJEO SOLOSTAR) 300 UNIT/ML SOPN Inject 85 Units into the skin 2 (two) times daily.    [provider]  insulin lispro (HUMALOG) 100 UNIT/ML KwikPen Inject 15-25 Units into the skin See admin instructions. Inject 15 units subcutaneously after breakfast and 25 units after lunch and supper    [provider]  losartan (COZAAR) 100 MG tablet Take 1 tablet by mouth daily. 03/25/20   [provider]  Lutein-Zeaxanthin 25-5 MG CAPS Take 1 tablet by mouth daily.    [provider]  metFORMIN (GLUCOPHAGE) 500 MG tablet 500 mg. 03/25/20   [provider]  metoprolol tartrate (LOPRESSOR) 25 MG tablet Take 1 tablet (25 mg total) by mouth 2 (two) times daily. 04/09/21   Donato Heinz, MD  mirtazapine (REMERON) 7.5 MG tablet Take 7.5 mg by mouth at bedtime.    [provider]  Multiple Vitamin (MULTIVITAMIN WITH MINERALS) TABS tablet Take 1 tablet by mouth daily.    [provider]  Omega-3 Fatty Acids (FISH OIL) 1000 MG CAPS Take 2,000 mg by mouth daily. Reported on 02/13/2016    [provider]  [facility] VERIO test strip  09/13/18   [provider]  Polyethyl Glycol-Propyl Glycol (SYSTANE OP) Place 1-2 drops into both eyes 4 (four) times daily as needed (dry eyes).     [provider]  PRESCRIPTION MEDICATION Inhale into the lungs at bedtime. CPAP    [provider]  SURE COMFORT PEN  NEEDLES 31G X 5 MM [place]  09/12/19   [provider]    Allergies    Melatonin  Review of Systems   Review of Systems  Constitutional: Positive for fatigue. Negative for chills and fever.  HENT: Negative for sore throat.   Eyes: Positive for visual disturbance (baseline).  Respiratory: Negative for shortness of breath.   Cardiovascular: Negative for chest pain.  Gastrointestinal: Negative for abdominal pain.  Genitourinary: Negative for dysuria.  Musculoskeletal: Negative for neck pain.  Skin: Negative for rash.  Neurological: Positive for tremors and weakness (r hand). Negative for headaches.    Physical Exam Updated Vital Signs BP (!) 152/111 (BP Location: Right Arm)   Pulse (!) 105   Temp 98.7 F (37.1 C)   Resp (!) 22   SpO2 100%   Physical Exam Vitals and nursing note reviewed.  Constitutional:      Appearance: Normal appearance. He is well-developed.  HENT:     Head: Normocephalic and atraumatic.  Eyes:     Conjunctiva/sclera: Conjunctivae normal.  Cardiovascular:     Rate and Rhythm: Normal rate and regular rhythm.     Pulses: Normal pulses.     Heart sounds: No murmur heard.   Pulmonary:     Effort: Pulmonary effort is normal. No respiratory distress.     Breath sounds: Normal breath sounds.  Abdominal:     Palpations: Abdomen is soft.     Tenderness: There is no abdominal tenderness.  Musculoskeletal:        General: No deformity.     Cervical back: Neck supple.     Right lower leg: No edema.     Left lower leg: No edema.     Comments: Scar over left knee from her left total knee replacement  Skin:    General: Skin is warm and dry.  Neurological:     Mental Status: He is alert.     Sensory: No sensory deficit.     Motor: No  weakness.     Comments: I find the patient speech to be fluent although he says it is taking a little more effort to articulate.  No obvious facial asymmetry.  Upper extremity strength appears intact along with lower  extremity strength.  He feels some subtle weakness in his right hand.  Left-sided tremor most pronounced in left hand.  He was using a cane for ambulation on arrival.  He says his balance feels a little more off than usual.     ED Results / Procedures / Treatments   Labs (all labs ordered are listed, but only abnormal results are displayed) Labs Reviewed  BASIC METABOLIC PANEL - Abnormal; Notable for the following components:      Result Value   Glucose, Bld 228 (*)    Creatinine, Ser 1.68 (*)    GFR, Estimated 43 (*)    All other components within normal limits  URINALYSIS, ROUTINE W REFLEX MICROSCOPIC - Abnormal; Notable for the following components:   APPearance HAZY (*)    Glucose, UA 150 (*)    Hgb urine dipstick MODERATE (*)    Protein, ur 100 (*)    All other components within normal limits  GLUCOSE, CAPILLARY - Abnormal; Notable for the following components:   Glucose-Capillary 166 (*)    All other components within normal limits  GLUCOSE, CAPILLARY - Abnormal; Notable for the following components:   Glucose-Capillary 203 (*)    All other components within normal limits  CBG MONITORING, ED - Abnormal; Notable for the following components:   Glucose-Capillary 199 (*)    All other components within normal limits  RESP PANEL BY RT-PCR (FLU A&B, COVID) ARPGX2  CULTURE, BLOOD (ROUTINE X 2)  CULTURE, BLOOD (ROUTINE X 2)  URINE CULTURE  CBC  BRAIN NATRIURETIC PEPTIDE  HEMOGLOBIN A1C  CBC  COMPREHENSIVE METABOLIC PANEL    EKG EKG Interpretation  Date/Time:  Sunday Apr 21 2021 12:40:38 EDT Ventricular Rate:  100 PR Interval:  214 QRS Duration: 114 QT Interval:  354 QTC Calculation: 456 R Axis:   -67 Text Interpretation: Sinus rhythm with 1st degree A-V block Left axis deviation Minimal voltage criteria for LVH, may be normal variant ( Cornell product ) Anteroseptal infarct , age undetermined Abnormal ECG rate increased from prior 2 days ago Confirmed by Aletta Edouard [phone]) on 04/21/2021 12:54:45 PM   Radiology CT Head Wo Contrast  Result Date: 04/19/2021 CLINICAL DATA:  Slurred speech EXAM: CT HEAD WITHOUT CONTRAST TECHNIQUE: Contiguous axial images were obtained from the base of the skull through the vertex without intravenous contrast. COMPARISON:  CT brain 01/29/2021 FINDINGS: Brain: No acute territorial infarction, hemorrhage or intracranial mass. Chronic lacunar infarct within the right basal ganglia and white matter. Mild atrophy and chronic small vessel ischemic change of the white matter. Left anterior deep brain stimulator with tip similar in position. Stable ventricle size. Vascular: No hyperdense vessels.  Carotid vascular calcification. Skull: Normal. Negative for fracture or focal lesion. Sinuses/Orbits: Chronic nasal bone deformity. Other: None IMPRESSION: 1. No CT evidence for acute intracranial abnormality. Atrophy and mild chronic small vessel ischemic changes of the white matter. Chronic lacunar infarcts right basal ganglia and white matter 2. Similar positioning of left deep brain stimulator Electronically Signed   By: Donavan Foil M.D.   On: 04/19/2021 23:45   DG Chest Port 1 View  Result Date: 04/19/2021 CLINICAL DATA:  Slurred speech for 2 days with positive blood cultures, initial encounter EXAM: PORTABLE CHEST 1 VIEW COMPARISON:  01/29/2021 FINDINGS: Cardiac shadow is within normal limits. Stimulator pack is again noted on the right. The lungs are clear. Minimal scarring is noted in the left base. No bony abnormality is noted. IMPRESSION: No active disease. Electronically Signed   By: Inez Catalina M.D.   On: 04/19/2021 23:37    Procedures Procedures   Medications Ordered in ED Medications  ceFAZolin (ANCEF) IVPB 2g/100 mL premix (2 g Intravenous New Bag/Given 04/21/21 2139)  sodium chloride flush (NS) 0.9 % injection 3 mL (3 mLs Intravenous Given 04/21/21 1607)  acetaminophen (TYLENOL) tablet 650 mg (has no administration in  time range)    Or  acetaminophen (TYLENOL) suppository 650 mg (has no administration in time range)  ondansetron (ZOFRAN) tablet 4 mg (has no administration in time range)    Or  ondansetron (ZOFRAN) injection 4 mg (has no administration in time range)  albuterol (PROVENTIL) (2.5 MG/3ML) 0.083% nebulizer solution 2.5 mg (has no administration in time range)  amLODipine (NORVASC) tablet 10 mg (10 mg Oral Given 04/21/21 2104)  atorvastatin (LIPITOR) tablet 80 mg (80 mg Oral Given 04/21/21 1740)  insulin glargine (LANTUS) injection 80 Units (80 Units Subcutaneous Given 04/21/21 2149)  insulin aspart (novoLOG) injection 0-15 Units (3 Units Subcutaneous Given 04/21/21 1740)  metoprolol tartrate (LOPRESSOR) tablet 25 mg (25 mg Oral Given 04/21/21 2104)  losartan (COZAAR) tablet 100 mg (has no administration in time range)  fenofibrate tablet 54 mg (has no administration in time range)  ezetimibe (ZETIA) tablet 10 mg (has no administration in time range)  DULoxetine (CYMBALTA) DR capsule 60 mg (has no administration in time range)  escitalopram (LEXAPRO) tablet 20 mg (has no administration in time range)  cycloSPORINE (RESTASIS) 0.05 % ophthalmic emulsion 1 drop (has no administration in time range)  colchicine tablet 0.6 mg (0.6 mg Oral Given 04/21/21 2104)  mirtazapine (REMERON) tablet 7.5 mg (7.5 mg Oral Given 04/21/21 2101)  artificial tears (LACRILUBE) ophthalmic ointment (has no administration in time range)  enoxaparin (LOVENOX) injection 115 mg (115 mg Subcutaneous Given 04/21/21 2105)    ED Course  I have reviewed the triage vital signs and the nursing notes.  Pertinent labs & imaging results that were available during my care of the patient were reviewed by me and considered in my medical decision making (see chart for details).  Clinical Course as of 04/21/21 2216  Sun Apr 21, 2021  1331 I consulted Dr. Graylon Good from infectious disease.  Reviewed the case and information that I had  available.  She is recommending another set of blood cultures and have the patient admitted.  Ancef 2 g every 8 hours.  Probably will need a cardiac echo. [MB]  1350 Blood culture from 2 days ago was positive for staph species [MB]  1350 Discussed with Dr. Tamala Julian from [facility] who will evaluate the patient for admission. [MB]    Clinical Course User Index [MB] Hayden Rasmussen, MD   MDM Rules/Calculators/A&P                         This patient complains of positive blood culture; this involves an extensive number of treatment Options and is a complaint that carries with it a high risk of complications and Morbidity. The differential includes bacteremia, contamination, recent stroke, metabolic derangement, infection including pneumonia, UTI, valvular vegetation  I ordered, reviewed and interpreted labs, which included CBC with normal white count normal hemoglobin, normal chemistries other than elevated BUN and creatinine and glucose,  urinalysis equivocal for infection with 21-50 reds no whites, COVID testing flu testing negative I ordered medication IV antibiotics I reviewed prior chest x-ray from 2 days ago that did not show any acute infiltrates. Additional history obtained from patient's spouse Previous records obtained and reviewed in epic including prior ED visit 2 days ago for similar presentation I consulted Dr. Graylon Good infectious disease and Dr. Tamala Julian [facility] hospitalist and discussed lab and imaging findings  Critical Interventions: None  After the interventions stated above, I reevaluated the patient and found patient be minimally symptomatic.  He will be admitted to the hospital for IV antibiotics and follow gives culture results.  He is agreeable to plan.   Final Clinical Impression(s) / ED Diagnoses Final diagnoses:  Bacteremia  Recent cerebrovascular accident (CVA)    Rx / DC Orders ED Discharge Orders    None       Hayden Rasmussen, MD 04/21/21 2219

## 2021-04-21 NOTE — Progress Notes (Signed)
Placed patient on CPAP for the night via auto-mode with minimum pressure set at 7cm and maximum pressure set at 22cm.

## 2021-04-21 NOTE — ED Notes (Signed)
Called to give report due to ready bed indicated, informed by Anderson Malta that the pt is not yet assigned and they are still looking at him.

## 2021-04-21 NOTE — Progress Notes (Signed)
Received request for TEE. We are unable to schedule on weekends/holidays but I did send a message to our cardmaster to review on Tuesday to schedule if there is availability. Please keep NPO after midnight for Tuesday (order placed). Our team will revisit with update when procedure date has been finalized.

## 2021-04-21 NOTE — ED Triage Notes (Signed)
Pt admitted on Thursday for a stroke while visiting his son in [place].  States he was discharged and received a call from doctor stating he had a + blood culture.  He was seen at [facility] on 5/27 and discharged.  This morning he received a message that he needed to return to hospital for admission.  Reports generalized weakness.

## 2021-04-21 NOTE — Progress Notes (Signed)
Was informed by Dr. Venetia Constable and Llana Aliment that patient's MRI was unable to be performed due to DBS not being able to go into MRI mode related to an impedence error.  Maybe it is a software issue.  They're going to call [employer] and see if they have any ideas, but if the impedences are too far out of spec, there's no way to fix that without replacing the wires in his brain.

## 2021-04-22 ENCOUNTER — Inpatient Hospital Stay (HOSPITAL_COMMUNITY): Payer: Medicare PPO

## 2021-04-22 DIAGNOSIS — I639 Cerebral infarction, unspecified: Secondary | ICD-10-CM

## 2021-04-22 DIAGNOSIS — N1831 Chronic kidney disease, stage 3a: Secondary | ICD-10-CM | POA: Diagnosis not present

## 2021-04-22 DIAGNOSIS — R7881 Bacteremia: Secondary | ICD-10-CM | POA: Diagnosis not present

## 2021-04-22 DIAGNOSIS — B957 Other staphylococcus as the cause of diseases classified elsewhere: Secondary | ICD-10-CM

## 2021-04-22 DIAGNOSIS — E1169 Type 2 diabetes mellitus with other specified complication: Secondary | ICD-10-CM | POA: Diagnosis not present

## 2021-04-22 DIAGNOSIS — G250 Essential tremor: Secondary | ICD-10-CM | POA: Diagnosis not present

## 2021-04-22 LAB — CBC
HCT: 37.9 % — ABNORMAL LOW (ref 39.0–52.0)
Hemoglobin: 12.5 g/dL — ABNORMAL LOW (ref 13.0–17.0)
MCH: 29.9 pg (ref 26.0–34.0)
MCHC: 33 g/dL (ref 30.0–36.0)
MCV: 90.7 fL (ref 80.0–100.0)
Platelets: 206 10*3/uL (ref 150–400)
RBC: 4.18 MIL/uL — ABNORMAL LOW (ref 4.22–5.81)
RDW: 13.1 % (ref 11.5–15.5)
WBC: 9 10*3/uL (ref 4.0–10.5)
nRBC: 0 % (ref 0.0–0.2)

## 2021-04-22 LAB — GLUCOSE, CAPILLARY
Glucose-Capillary: 101 mg/dL — ABNORMAL HIGH (ref 70–99)
Glucose-Capillary: 139 mg/dL — ABNORMAL HIGH (ref 70–99)
Glucose-Capillary: 187 mg/dL — ABNORMAL HIGH (ref 70–99)

## 2021-04-22 LAB — COMPREHENSIVE METABOLIC PANEL
ALT: 29 U/L (ref 0–44)
AST: 21 U/L (ref 15–41)
Albumin: 3.3 g/dL — ABNORMAL LOW (ref 3.5–5.0)
Alkaline Phosphatase: 77 U/L (ref 38–126)
Anion gap: 7 (ref 5–15)
BUN: 22 mg/dL (ref 8–23)
CO2: 28 mmol/L (ref 22–32)
Calcium: 9 mg/dL (ref 8.9–10.3)
Chloride: 105 mmol/L (ref 98–111)
Creatinine, Ser: 1.83 mg/dL — ABNORMAL HIGH (ref 0.61–1.24)
GFR, Estimated: 39 mL/min — ABNORMAL LOW (ref >60)
Glucose, Bld: 147 mg/dL — ABNORMAL HIGH (ref 70–99)
Potassium: 3.8 mmol/L (ref 3.5–5.1)
Sodium: 140 mmol/L (ref 135–145)
Total Bilirubin: 0.6 mg/dL (ref 0.3–1.2)
Total Protein: 6.1 g/dL — ABNORMAL LOW (ref 6.5–8.1)

## 2021-04-22 LAB — CK: Total CK: 115 U/L (ref 49–397)

## 2021-04-22 MED ORDER — SODIUM CHLORIDE 0.9 % IV SOLN
8.0000 mg/kg | Freq: Every day | INTRAVENOUS | Status: DC
  Administered 2021-04-22: 950 mg via INTRAVENOUS
  Filled 2021-04-22 (×2): qty 19

## 2021-04-22 MED ORDER — IOHEXOL 350 MG/ML SOLN
50.0000 mL | Freq: Once | INTRAVENOUS | Status: AC | PRN
  Administered 2021-04-22: 50 mL via INTRAVENOUS

## 2021-04-22 NOTE — Progress Notes (Deleted)
Cardiology Office Note:    Date:  04/22/2021   ID:  Joshua [name], DOB 1951/06/04, MRN [id number]  PCP:  [name], MD  Cardiologist:  Donato Heinz, MD  Electrophysiologist:  None   Referring MD: [name], MD   No chief complaint on file.   History of Present Illness:    Joshua [name] is a 70 y.o. male with a hx of HCM, hypertension, diabetes, OSA, PE after knee surgery, essential tremor status post DBS June 2020, CVA who presents for follow-up.  Admitted to [facility] from 08/28/19 through 08/31/19 with an acute CVA.  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  MRI brain showed right internal capsule infarct.  Echocardiogram showed no cardiogenic source of embolism.  He was started on heparin given subclavian artery thrombosis, and this was transitioned to Eliquis.  It was unclear if the thrombosis was due to a cardiac source or developed in situ.  Stroke team recommended Eliquis for 2 months and repeating CTA neck; if CTA neck negative and no atrial fibrillation noted on 30-day monitor, stroke team recommended stopping Eliquis and resuming antiplatelet agent.  TTE was notable for an incidental finding of asymmetric basal septal hypertrophy.  He was referred to cardiology and seen on 09/19/19.  Cardiac MRI was ordered, which showed Basal septal hypertrophy measuring up to 59mm (lateral wall 51mm), consistent with hypertrophic cardiomyopathy.  PYP scan showed no evidence of amyloid.  Cardiac monitor showed no VT, occasional PVCs (1.3% of beats).   Repeat monitor on 08/22/2020 showed 1 4 beat run of NSVT, 6 runs of SVT longest lasting 16 beats, occasional PVCs (4%).  He denies any family history of HCM.  Does have vertigo.  No syncope except with CVA years ago.  He was admitted to [facility] from 3/8 through 02/02/2021 with shortness of breath and elevated effort for cardiac fusion.  Started on colchicine and Eliquis was held.  No adequate window for  pericardiocentesis.  Repeat echo 03/08/2021 showed pericardial effusion had resolved, LVEF 55 to 84%, grade 1 diastolic dysfunction, moderate LVH, strain abnormalities suggestive of cardiac amyloidosis.  Lexiscan Myoview on 02/12/2021 showed no evidence of ischemia, EF 53%.  He developed slurred speech and abnormal sensation of right hand while in [place] on 04/17/2021.  Was admitted to local hospital and discharged after CT showed no acute findings.  MRI was not performed due to deep brain stimulator.  He traveled back to [place] and received a call that blood cultures were positive for staph epi (1 out of 2), prompting him to go to [facility] ED 5/27.  TEE showed***  Since last clinic visit,  he reports he has been doing okay.  States that main complaint is that he feels tired all the time.  He denies any chest pain.  Does report having some dyspnea.  Continues to have lightheadedness which he attributes to vertigo, denies any syncope.  Denies any lower extremity edema.  No palpitations.   Wt Readings from Last 3 Encounters:  04/22/21 255 lb 4.7 oz (115.8 kg)  04/19/21 250 lb (113.4 kg)  03/26/21 257 lb (116.6 kg)      Past Medical History:  Diagnosis Date  . Benign essential tremor   . Benign positional vertigo   . CVA (cerebral vascular accident) ([place])    x2 - L retina, 1 right parietal  . Degenerative arthritis   . Depression   . Diabetes mellitus   . DVT (deep  venous thrombosis) ([place]) 2018  . Dyslipidemia   . GERD (gastroesophageal reflux disease)    hiatal hernia  . Gout   . H/O: vasectomy   . Hearing aid worn    b/l  . Hx of appendectomy   . Hx of tonsillectomy   . Hypertension   . Hypertrophic cardiomyopathy ([place])   . 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 Melanoma ([place])   . NSVT (nonsustained ventricular tachycardia) (HCC)    1 4 beat run on event monitor in 07/2020  . Obesity   . OSA on CPAP    setting = 5  . Pulmonary emboli ([place]) 2018  . PVC's (premature  ventricular contractions)   . SVT (supraventricular tachycardia) (HCC)    by event monitor  . Tremor, essential 06/22/2017  . Wears glasses     Past Surgical History:  Procedure Laterality Date  . APPENDECTOMY    . arthroscopic knee surgery Bilateral   . CATARACT EXTRACTION Bilateral   . COLONOSCOPY    . MINOR PLACEMENT OF FIDUCIAL N/A 06/30/2019   Procedure: Fiducial placement;  Surgeon: Erline Levine, MD;  Location: [place];  Service: Neurosurgery;  Laterality: N/A;  Fiducial placement  . NASAL SEPTUM SURGERY    . PULSE GENERATOR IMPLANT N/A 07/14/2019   Procedure: Left cranial Implanted Pulse Generator and lead extension placement to right chest ;  Surgeon: Erline Levine, MD;  Location: [place];  Service: Neurosurgery;  Laterality: N/A;  . SUBTHALAMIC STIMULATOR INSERTION Left 07/07/2019   Procedure: LEFT DEEP BRAIN STIMULATOR PLACEMENT;  Surgeon: Erline Levine, MD;  Location: [place];  Service: Neurosurgery;  Laterality: Left;  . TONSILLECTOMY    . TOTAL KNEE ARTHROPLASTY Left 03/30/2017   Procedure: LEFT TOTAL KNEE ARTHROPLASTY;  Surgeon: Paralee Cancel, MD;  Location: [facility];  Service: Orthopedics;  Laterality: Left;  . WISDOM TOOTH EXTRACTION      Current Medications: No outpatient medications have been marked as taking for the 04/25/21 encounter (Appointment) with Donato Heinz, MD.     Allergies:   Melatonin   Social History   Socioeconomic History  . Marital status: Married    Spouse name: [name]  . Number of children: 2  . Years of education: Bachelors   . Highest education level: Bachelor's degree (e.g., BA, AB, BS)  Occupational History  . Occupation: retired    Fish farm manager: [employer]    Comment: teaching/coaching  Tobacco Use  . Smoking status: Former Smoker    Packs/day: 1.00    Years: 10.00    Pack years: 10.00    Types: Cigarettes    Quit date: 11/25/1983    Years since quitting: 37.4  . Smokeless tobacco: Never Used  Vaping Use  . Vaping Use:  Never used  Substance and Sexual Activity  . Alcohol use: Yes    Alcohol/week: 2.0 standard drinks    Types: 2 Standard drinks or equivalent per week    Comment: occassionally  . Drug use: No  . Sexual activity: Not on file  Other Topics Concern  . Not on file  Social History Narrative   Patient lives at home with wife. [name](   Patient has 2 children that are in good health.    Patient works for [employer]. Retired .   Patient has a Bachelors degree in History.    Social Determinants of Health   Financial Resource Strain: Not on file  Food Insecurity: Not on file  Transportation Needs: Not  on file  Physical Activity: Not on file  Stress: Not on file  Social Connections: Not on file     Family History: The patient's family history includes Alzheimer's disease in his father; Cerebral aneurysm in his mother; Diabetes in his maternal grandmother; Healthy in his son; Tremor in his brother, maternal uncle, and mother. There is no history of Colon cancer.  ROS:   Please see the history of present illness.     All other systems reviewed and are negative.  EKGs/Labs/Other Studies Reviewed:    The following studies were reviewed today:   EKG:  EKG is ordered today.  EKG ordered shows sinus rhythm, first-degree AV block, rate 92, left axis deviation, Q waves II, III, aVF, poor R wave progression  Cardiac MRI 10/07/19: 1. Limited study, as only a few sequences were able to be completed due to limitations from presence of deep brain stimulator 2. Basal septal hypertrophy measuring up to 61mm (lateral wall 12 mm), consistent with hypertrophic cardiomyopathy 3. Patchy late gadolinium enhancement in basal septum, consistent with HCM 4. Basal inferolateral midwall LGE, which would not be a typical pattern for HCM, as more commonly seen in setting of prior myocarditis or sarcoidosis. Fabry's disease is associated with asymmetric hypertrophy and basal inferolateral  LGE 5.  Normal LV size with hyperdynamic systolic function (EF 81%) 6.  Normal RV size and systolic function (EF 82%)  TTE 08/29/19: 1. Technically difficult study. Left ventricular ejection fraction appears grossly normal, approximately 55-60%, though difficult visualization even with contrast 2. There is asymmetric basal septal hypertrophy measuring 18 mm in basal septum (12 mm posterior wall). Consider cardiac MRI to assess for hypertrophic cardiomyopathy if clinically indicated 3. Definity contrast agent was given IV to delineate the left ventricular endocardial borders. 4. Global right ventricle has normal systolic function.The right ventricular size is normal. No increase in right ventricular wall thickness. 5. There is mild dilatation of the aortic root measuring 39 mm. 6. The inferior vena cava is dilated in size with <50% respiratory variability, suggesting right atrial pressure of 15 mmHg.  PYP scan 10/25/19:  The study is normal.  No evidence of TTR amyloidosis.   Cardiac monitor 11/11/19:  No significant abnormalities  No atrial fibrillation. No VT  Occasional PVCs (1.3% of beats).  1 patient triggered event, which appears to correspond to short pause (1.1 seconds) from a blocked PA   Predominant rhythm is sinus rhythm. Range is 57 to 137 bpm with average of 89 bpm. No atrial fibrillation, sustained ventricular tachycardia, significant pause, or high degree AV block. Occasional PVCs (1.3% of beats). 1 patient triggered events. Triggered event appears to correspond to short pause (1.1 seconds) from a blocked PAC.  No significant abnormalities.   Recent Labs: 01/30/2021: TSH 1.421 03/26/2021: Magnesium 2.0 04/21/2021: B Natriuretic Peptide 13.0 04/22/2021: ALT 29; BUN 22; Creatinine, Ser 1.83; Hemoglobin 12.5; Platelets 206; Potassium 3.8; Sodium 140  Recent Lipid Panel    Component Value Date/Time   CHOL 161 10/24/2020 0943   TRIG 180 (H) 10/24/2020 0943   HDL 30  (L) 10/24/2020 0943   CHOLHDL 5.4 (H) 10/24/2020 0943   CHOLHDL 7.0 08/29/2019 0409   VLDL 50 (H) 08/29/2019 0409   LDLCALC 99 10/24/2020 0943    Physical Exam:    VS:  There were no vitals taken for this visit.    Wt Readings from Last 3 Encounters:  04/22/21 255 lb 4.7 oz (115.8 kg)  04/19/21 250 lb (113.4 kg)  03/26/21  257 lb (116.6 kg)     GEN: Well nourished, well developed in no acute distress HEENT: Normal NECK: No JVD LYMPHATICS: No lymphadenopathy CARDIAC: RRR, no murmurs, rubs, gallops RESPIRATORY:  Clear to auscultation without rales, wheezing or rhonchi  ABDOMEN: Soft, non-tender, non-distended MUSCULOSKELETAL:  No edema; No deformity  SKIN: Warm and dry NEUROLOGIC:  Alert and oriented x 3 PSYCHIATRIC:  Normal affect   ASSESSMENT:    No diagnosis found. PLAN:    Hypertrophic cardiomyopathy: 22mm (lateral wall 12 mm) on CMR.  In addition, had unusual scar pattern for HCM, with basal inferolateral scar on MRI.  Fabry's disease can be associated with this scar pattern and asymmetric hypertrophy, so alpha galactosidase was checked and was normal. Scar pattern not c/w amyloid on CMR, but unfortunately unable to do T1 mapping to r/o amyloid due to DBS.  No evidence of amyloid on PYP scan.  SPEP/UPEP unremarkable.  Scar pattern may represent prior episode of myocarditis.  Suspect HCM.  No obstruction.  Recommended screening of first-degree relatives with TTE, he reports that he has discussed with his sons.  Cardiac monitor on 08/22/2020 showed one 4 beat run of NSVT, 6 runs of SVT longest lasting 16 beats, occasional PVCs (4%).  -Continue metoprolol 25 mg twice daily -Which favor avoiding diuretics given suspected HCM.  Will change Lasix to as needed.  Asked to monitor daily weights and take if gains more than 3 pounds in 1 day or 5 pounds in 1 week  Pericardial effusion: Large effusion noted on echocardiogram 01/2021.  Started on colchicine for suspected pericarditis.   Repeat echocardiogram 03/12/2021 shows resolution of effusion -Restart Eliquis given effusion resolved -Continue colchicine for 6 months course  Dyspnea: Reports dyspnea with minimal exertion.  Echo and [employer] results as above  CVA: Subclavian artery thrombosis diagnosed, unclear if cardioembolic source or developed in situ.    Will restart Eliquis as above.  No AF on cardiac monitor.  Hypertension: On amlodipine 5 mg daily,  losartan 100 mg daily.  Appears controlled  Hyperlipidemia: On atorvastatin 80 mg daily.  LDL 99 on 10/24/2020.  Calcium score 588 (68th percentile).  Zetia 10 mg daily was added  Type 2 diabetes: A1c 8.9% on 01/30/2021.  On insulin  PVCs: occasional (1.3%) on monitor.  He is asymptomatic and with normal LV systolic function, no treatment indicated   RTC in 6 to 8 weeks    Medication Adjustments/Labs and Tests Ordered: Current medicines are reviewed at length with the patient today.  Concerns regarding medicines are outlined above.  No orders of the defined types were placed in this encounter.  No orders of the defined types were placed in this encounter.   There are no Patient Instructions on file for this visit.   Signed, Donato Heinz, MD  04/22/2021 9:22 PM    [facility]

## 2021-04-22 NOTE — Evaluation (Signed)
Clinical/Bedside Swallow Evaluation Patient Details  Name: Joshua [name] MRN: [id number] Date of Birth: 1951-02-01  Today's Date: 04/22/2021 Time: SLP Start Time (ACUTE ONLY): 1416 SLP Stop Time (ACUTE ONLY): 1428 SLP Time Calculation (min) (ACUTE ONLY): 12 min  Past Medical History:  Past Medical History:  Diagnosis Date  . Benign essential tremor   . Benign positional vertigo   . CVA (cerebral vascular accident) ([place])    x2 - L retina, 1 right parietal  . Degenerative arthritis   . Depression   . Diabetes mellitus   . DVT (deep venous thrombosis) ([place]) 2018  . Dyslipidemia   . GERD (gastroesophageal reflux disease)    hiatal hernia  . Gout   . H/O: vasectomy   . Hearing aid worn    b/l  . Hx of appendectomy   . Hx of tonsillectomy   . Hypertension   . Hypertrophic cardiomyopathy ([place])   . 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 Melanoma ([place])   . NSVT (nonsustained ventricular tachycardia) (HCC)    1 4 beat run on event monitor in 07/2020  . Obesity   . OSA on CPAP    setting = 5  . Pulmonary emboli ([place]) 2018  . PVC's (premature ventricular contractions)   . SVT (supraventricular tachycardia) (HCC)    by event monitor  . Tremor, essential 06/22/2017  . Wears glasses    Past Surgical History:  Past Surgical History:  Procedure Laterality Date  . APPENDECTOMY    . arthroscopic knee surgery Bilateral   . CATARACT EXTRACTION Bilateral   . COLONOSCOPY    . MINOR PLACEMENT OF FIDUCIAL N/A 06/30/2019   Procedure: Fiducial placement;  Surgeon: Erline Levine, MD;  Location: [place];  Service: Neurosurgery;  Laterality: N/A;  Fiducial placement  . NASAL SEPTUM SURGERY    . PULSE GENERATOR IMPLANT N/A 07/14/2019   Procedure: Left cranial Implanted Pulse Generator and lead extension placement to right chest ;  Surgeon: Erline Levine, MD;  Location: [place];  Service: Neurosurgery;  Laterality: N/A;  . SUBTHALAMIC STIMULATOR INSERTION Left 07/07/2019   Procedure: LEFT DEEP  BRAIN STIMULATOR PLACEMENT;  Surgeon: Erline Levine, MD;  Location: [place];  Service: Neurosurgery;  Laterality: Left;  . TONSILLECTOMY    . TOTAL KNEE ARTHROPLASTY Left 03/30/2017   Procedure: LEFT TOTAL KNEE ARTHROPLASTY;  Surgeon: Paralee Cancel, MD;  Location: [facility];  Service: Orthopedics;  Laterality: Left;  . WISDOM TOOTH EXTRACTION     HPI:  Joshua [name] is a 70 y.o. male with recent CVA on Eliquis prior to arrival.  On 5/26 patient reported having worsening slurred speech with worsening right-sided weakness. After d/c from prior hospital for CVA pt found out he had Staphylococcal bacteremia: Acute.  Patient had recently had blood cultures initially drawn on 5/26 which were reported to be positive while patient was in [facility]. He returned to hospital.  He has a medical history significant of hypertension, hypertrophic cardiomyopathy, cardioembolic CVA on Eliquis, diabetes mellitus type 2, PE after surgery, essential tremor s/p deep brain stimulator 2020, OSA on CPAP. Pt had an OP MBS on 09/13/20 due to report of frequent coughing with liquids. Pt did have one instance of sensed aspiration before the swallow with ejection. Aspiration precautions and strategies reviewed.   Assessment / Plan / Recommendation Clinical Impression  SLP and pt and wife discussed pts history of frequent coughing with thin liquids, which has been addressed in prior MBS. Pt has  not had any pna resulting from this problem, but does find it troublesome. SLP provided some education regarding swallowing physiology, sensation of aspiration, cough response and potential interventions to attempt. Briefly attemped a chin tuck though pt still coughed after. Pt reports the problem is stable, has not gotten worse over the past three days since his stroke. Given need to attempt different strategies (suggest chin tuck, supraglottic swallow and/or a modified cup that can reduce bolus size like a lidded disposable coffee  cup)and overt dysarthria, recommend pt f/u with OP SLP after d/c. Pt to continue current diet. No further acute work up needed.      Aspiration Risk  Mild aspiration risk    Diet Recommendation Thin liquid;Regular   Liquid Administration via: Cup Medication Administration: Whole meds with liquid Supervision: Patient able to self feed Compensations: Slow rate;Small sips/bites;Chin tuck (supraglottic swallow or lidded cup) Postural Changes: Seated upright at 90 degrees    Other  Recommendations     Follow up Recommendations Outpatient SLP      Frequency and Duration            Prognosis        Swallow Study   General HPI: Joshua [name] is a 70 y.o. male with recent CVA on Eliquis prior to arrival.  On 5/26 patient reported having worsening slurred speech with worsening right-sided weakness. After d/c from prior hospital for CVA pt found out he had Staphylococcal bacteremia: Acute.  Patient had recently had blood cultures initially drawn on 5/26 which were reported to be positive while patient was in [facility]. He returned to hospital.  He has a medical history significant of hypertension, hypertrophic cardiomyopathy, cardioembolic CVA on Eliquis, diabetes mellitus type 2, PE after surgery, essential tremor s/p deep brain stimulator 2020, OSA on CPAP. Pt had an OP MBS on 09/13/20 due to report of frequent coughing with liquids. Pt did have one instance of sensed aspiration before the swallow with ejection. Aspiration precautions and strategies reviewed. Type of Study: Bedside Swallow Evaluation Previous Swallow Assessment: see HPI Diet Prior to this Study: Regular;Thin liquids Temperature Spikes Noted: No Respiratory Status: Room air History of Recent Intubation: No Behavior/Cognition: Alert;Cooperative;Pleasant mood Oral Cavity Assessment: Within Functional Limits Oral Care Completed by SLP: No Oral Cavity - Dentition: Adequate natural dentition Vision: Functional  for self-feeding Self-Feeding Abilities: Able to feed self Patient Positioning: Upright in chair Baseline Vocal Quality: Normal    Oral/Motor/Sensory Function Overall Oral Motor/Sensory Function: Within functional limits   Ice Chips     Thin Liquid Thin Liquid: Impaired Presentation: Self Fed;Cup Pharyngeal  Phase Impairments: Cough - Immediate    Nectar Thick Nectar Thick Liquid: Not tested   Honey Thick Honey Thick Liquid: Not tested   Puree Puree: Not tested   Solid     Solid: Not tested     Joshua Baltimore, MA CCC-SLP  Acute Rehabilitation Services Pager [phone] Office [phone]  Maythe Deramo, Katherene Ponto 04/22/2021,2:53 PM

## 2021-04-22 NOTE — Progress Notes (Signed)
PT Cancellation Note  Patient Details Name: Joshua [name] MRN: [id number] DOB: 06/26/1951   Cancelled Treatment:    Reason Eval/Treat Not Completed: Patient declined, no reason specified.  Pt is worn out.  RN asks to let him hold this afternoon. 04/22/2021  Ginger Carne., PT Acute Rehabilitation Services [phone]  (pager) [phone]  (office)   Tessie Fass Vianca Bracher 04/22/2021, 6:16 PM

## 2021-04-22 NOTE — Consult Note (Signed)
Neurology Consultation  Reason for Consult: Concern for stroke: slowed / slurred speech, unsteady gait, and right hand weakness Referring Physician: Dr. Bonner Puna  CC: Dysarthria, gait disturbance, loss of right hand dexterity  History is obtained from: Chart review, Patient, Patient's wife at bedside  HPI: Joshua [name] is a 70 y.o. 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 Patient was in [place] visiting his son on 5/25 when he had a sudden onset of slowed and slurred speech, unsteady gait, and loss of dexterity in his right hand. He went to the ED at an OSH but his CT head was without acute intracranial abnormality. He did not have his DBS device with him in order to convert his DBS to MRI compatible mode and was subsequently discharged. He received a phone call from the OSH indicating that he had a positive staphylococcus blood culture and was seen again on 5/27 at [facility] and was again discharged without further evidence of infection (patient remained afebrile without leukocytosis). He was re-contacted 5/29 for another positive blood culture result from 5/27 and came to [facility] for further evaluation. He has persistent slowed speech, dysarthria, unsteady gait, and loss of fine motor control of the right hand.   Per patient's wife, his speech was affected exactly like it had been during his stroke in October 2020 with severe dysarthria and slowing. She also states that his gait was similarly unsteady in October 2020 as it was on Wednesday. The only new finding that was not present during the stroke  in October 2020 is the right hand dexterity loss. She does endorse major improvement in patient's speech since onset on Wednesday morning and states that he has improved since yesterday and patient endorses some improvement in his gait since Wednesday as well. His right hand has remained impaired since onset. She states that his speech at baseline has improved drastically since his stroke in October 2020 but endorses some mild residual dysarthria at baseline.    Review of notes indicates that the patient had left hand and leg ataxia and left beating nystagmus on leftward gaze as well as rotary nystagmus on upward gaze in October 2020 on initial presentation, with scanning speech, all suggestive of a posterior fossa/cerebellar stroke which led to diagnosis of MRI negative posterior circulation stroke in addition to the acute right basal ganglia stroke revealed on imaging at the time.  Source of the posterior circulation stroke was felt to be a thrombus in the left subclavian, for which he was started on Eliquis.  Due to persistence of this thrombus on last imaging (December 21, 2019), he was continued on Eliquis.  This was stopped due to a large pericardial effusion seen on echocardiogram on 01/2019.  He was started on colchicine for suspected pericarditis with resolution of the effusion on echocardiogram 03/12/2021, with resumption of his Eliquis on 03/26/2021 at outpatient cardiology follow-up.  Wife additionally feels that the patient has longstanding gait issues with not clearing the threshold of their doorway into their home quite frequently catching his right foot in particular.  LKW: 5/25 08:00 tpa given?: no, outside of thrombolytic therapy window IR Thrombectomy? No, presentation not consistent with LVO  Modified Rankin Scale: 1-No significant post stroke disability and can perform usual duties with stroke symptoms  ROS: A complete ROS was performed and is negative except as noted in the HPI.   Past  Medical History:  Diagnosis Date  . Benign essential tremor   . Benign positional vertigo   . CVA (cerebral vascular accident) ([place] [facility])    x2 - L retina, 1 right parietal  . Degenerative arthritis   . Depression   . Diabetes mellitus   . DVT (deep venous thrombosis) ([place]) 2018  . Dyslipidemia   . GERD (gastroesophageal reflux disease)    hiatal hernia  . Gout   . H/O: vasectomy   . Hearing aid worn    b/l  . Hx of appendectomy   . Hx of tonsillectomy   . Hypertension   . Hypertrophic cardiomyopathy ([place])   . 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 Melanoma ([place])   . NSVT (nonsustained ventricular tachycardia) ([facility])    1 4 beat run on event monitor in 07/2020  . Obesity   . OSA on CPAP    setting = 5  . Pulmonary emboli ([place]) 2018  . PVC's (premature ventricular contractions)   . SVT (supraventricular tachycardia) ([facility])    by event monitor  . Tremor, essential 06/22/2017  . Wears glasses    Past Surgical History:  Procedure Laterality Date  . APPENDECTOMY    . arthroscopic knee surgery Bilateral   . CATARACT EXTRACTION Bilateral   . COLONOSCOPY    . MINOR PLACEMENT OF FIDUCIAL N/A 06/30/2019   Procedure: Fiducial placement;  Surgeon: Erline Levine, MD;  Location: [facility];  Service: Neurosurgery;  Laterality: N/A;  Fiducial placement  . NASAL SEPTUM SURGERY    . PULSE GENERATOR IMPLANT N/A 07/14/2019   Procedure: Left cranial Implanted Pulse Generator and lead extension placement to right chest ;  Surgeon: Erline Levine, MD;  Location: [facility];  Service: Neurosurgery;  Laterality: N/A;  . SUBTHALAMIC STIMULATOR INSERTION Left 07/07/2019   Procedure: LEFT DEEP BRAIN STIMULATOR PLACEMENT;  Surgeon: Erline Levine, MD;  Location: [facility];  Service: Neurosurgery;  Laterality: Left;  . TONSILLECTOMY    . TOTAL KNEE ARTHROPLASTY Left 03/30/2017   Procedure: LEFT TOTAL KNEE ARTHROPLASTY;  Surgeon: Paralee Cancel, MD;  Location: [facility];  Service: Orthopedics;  Laterality: Left;  . WISDOM  TOOTH EXTRACTION     Family History  Problem Relation Age of Onset  . Cerebral aneurysm Mother   . Tremor Mother   . Alzheimer's disease Father   . Tremor Brother   . Tremor Maternal Uncle   . Diabetes Maternal Grandmother   . Healthy Son   . Colon cancer Neg Hx    Social History:   reports that he quit smoking about 37 years ago. His smoking use included cigarettes. He has a 10.00 pack-year smoking history. He has never used smokeless tobacco. He reports current alcohol use of about 2.0 standard drinks of alcohol per week. He reports that he does not use drugs.  Medications  Current Facility-Administered Medications:  .  acetaminophen (TYLENOL) tablet 650 mg, 650 mg, Oral, Q6H PRN **OR** acetaminophen (TYLENOL) suppository 650 mg, 650 mg, Rectal, Q6H PRN, Smith, Rondell A, MD .  albuterol (PROVENTIL) (2.5 MG/3ML) 0.083% nebulizer solution 2.5 mg, 2.5 mg, Nebulization, Q6H PRN, Smith, Rondell A, MD .  amLODipine (NORVASC) tablet 10 mg, 10 mg, Oral, QHS, Smith, Rondell A, MD, 10 mg at 04/21/21 2104 .  artificial  tears (LACRILUBE) ophthalmic ointment, , Both Eyes, QID PRN, Smith, Rondell A, MD .  atorvastatin (LIPITOR) tablet 80 mg, 80 mg, Oral, q1800, Fuller Plan A, MD, 80 mg at 04/21/21 1740 .  colchicine tablet 0.6 mg, 0.6 mg, Oral, BID, Smith, Rondell A, MD, 0.6 mg at 04/22/21 0934 .  cycloSPORINE (RESTASIS) 0.05 % ophthalmic emulsion 1 drop, 1 drop, Both Eyes, BID PRN, Smith, Rondell A, MD .  DAPTOmycin (CUBICIN) 950 mg in sodium chloride 0.9 % IVPB, 8 mg/kg, Intravenous, Q2000, Carlyle Basques, MD, Last Rate: 138 mL/hr at 04/22/21 1254, 950 mg at 04/22/21 1254 .  DULoxetine (CYMBALTA) DR capsule 60 mg, 60 mg, Oral, Daily, Tamala Julian, Rondell A, MD, 60 mg at 04/22/21 0933 .  enoxaparin (LOVENOX) injection 115 mg, 115 mg, Subcutaneous, Q12H, Smith, Rondell A, MD, 115 mg at 04/22/21 0930 .  escitalopram (LEXAPRO) tablet 20 mg, 20 mg, Oral, Daily, Tamala Julian, Rondell A, MD, 20 mg at 04/22/21  0933 .  ezetimibe (ZETIA) tablet 10 mg, 10 mg, Oral, Daily, Tamala Julian, Rondell A, MD, 10 mg at 04/22/21 0933 .  fenofibrate tablet 54 mg, 54 mg, Oral, Daily, Tamala Julian, Rondell A, MD, 54 mg at 04/22/21 0932 .  insulin aspart (novoLOG) injection 0-15 Units, 0-15 Units, Subcutaneous, TID WC, Fuller Plan A, MD, 2 Units at 04/22/21 1135 .  insulin glargine (LANTUS) injection 80 Units, 80 Units, Subcutaneous, BID, Fuller Plan A, MD, 80 Units at 04/22/21 0932 .  losartan (COZAAR) tablet 100 mg, 100 mg, Oral, Daily, Tamala Julian, Rondell A, MD, 100 mg at 04/22/21 0934 .  metoprolol tartrate (LOPRESSOR) tablet 25 mg, 25 mg, Oral, BID, Smith, Rondell A, MD, 25 mg at 04/22/21 0933 .  mirtazapine (REMERON) tablet 7.5 mg, 7.5 mg, Oral, QHS, Smith, Rondell A, MD, 7.5 mg at 04/21/21 2101 .  ondansetron (ZOFRAN) tablet 4 mg, 4 mg, Oral, Q6H PRN **OR** ondansetron (ZOFRAN) injection 4 mg, 4 mg, Intravenous, Q6H PRN, Tamala Julian, Rondell A, MD .  sodium chloride flush (NS) 0.9 % injection 3 mL, 3 mL, Intravenous, Q12H, Tamala Julian, Rondell A, MD, 3 mL at 04/22/21 0934  Exam: Current vital signs: BP 127/84   Pulse 81   Temp 98.1 F (36.7 C) (Oral)   Resp 18   Ht 6\' 4"  (1.93 m)   Wt 115.8 kg   SpO2 95%   BMI 31.08 kg/m  Vital signs in last 24 hours: Temp:  [98 F (36.7 C)-98.9 F (37.2 C)] 98.1 F (36.7 C) (05/30 0354) Pulse Rate:  [75-86] 81 (05/30 0932) Resp:  [14-19] 18 (05/30 0354) BP: (127-164)/(79-89) 127/84 (05/30 0932) SpO2:  [94 %-100 %] 95 % (05/30 0354) Weight:  [115.8 kg] 115.8 kg (05/30 0545)  GENERAL: Awake, alert, in no acute distress Head: Normocephalic and atraumatic, dry mucous membranes EENT: Wears glasses at baseline, normal conjunctivae, no OP obstruction LUNGS: Normal respiratory effort, non-labored breathing, in no respiratory distress CV: Regular rate on telemetry, lower extremities without edema ABDOMEN: Soft, non-tender Ext: warm, without obvious deformity  NEURO:  Mental Status: Awake,  alert, and oriented to person, place, time, age, month, year, location, and situation. Patient is able to provide a clear and coherent history of present illness.  Speech/Language: speech is dysarthric and slowed.  Patient is not aphasic.   Naming, repetition, and comprehension intact, though he is dysfluent. No neglect is noted on examination Cranial Nerves:  II: PERRL 3 mm/brisk. Visual fields full.  III, IV, VI: EOMI without ptosis.  V: Sensation is intact to light touch and  symmetrical to face.  VII: Face is symmetric resting and smiling.   VIII: Hearing is intact to voice IX, X: Palate elevation is symmetric. Phonation normal.  XI: Normal sternocleidomastoid and trapezius muscle strength XII: Tongue protrudes midline without fasciculations  Motor: 5/5 strength is all muscle groups without pronator drift on assessment, EXCEPT the right hand.  Finger flexion 4-/5, finger extension 4+/5, finger abduction 4+/5, unable to maintain circle with thumb and index finger against resistance (4/5)  Tremor with intention noted in bilateral upper extremities, left > right.  Right upper extremity tremor increases with turning off his stimulator and quickly improves again with turning the stimulator back on. Tone and bulk are normal.  Sensation: Intact to light touch bilaterally in all four extremities.  Coordination: FTN intact on left upper extremity, right upper extremity with some loss of coordinated movements. HKS intact bilaterally. No pronator drift.  Gait: deferred  NIHSS: 1a Level of Conscious.: 0 1b LOC Questions: 0 1c LOC Commands: 0 2 Best Gaze: 0 3 Visual: 0 4 Facial Palsy: 0 5a Motor Arm - left: 0 5b Motor Arm - Right: 0 6a Motor Leg - Left: 0 6b Motor Leg - Right: 0 7 Limb Ataxia: 1 8 Sensory: 0 9 Best Language: 0 10 Dysarthria: 1 11 Extinct. and Inatten.: 0 TOTAL: 2  Labs I have reviewed labs in epic and the results pertinent to this consultation are: CBC    Component  Value Date/Time   WBC 9.0 04/22/2021 0054   RBC 4.18 (L) 04/22/2021 0054   HGB 12.5 (L) 04/22/2021 0054   HGB 13.8 10/24/2020 0943   HCT 37.9 (L) 04/22/2021 0054   HCT 40.4 10/24/2020 0943   PLT 206 04/22/2021 0054   PLT 237 10/24/2020 0943   MCV 90.7 04/22/2021 0054   MCV 88 10/24/2020 0943   MCH 29.9 04/22/2021 0054   MCHC 33.0 04/22/2021 0054   RDW 13.1 04/22/2021 0054   RDW 12.7 10/24/2020 0943   LYMPHSABS 4.4 (H) 04/19/2021 2112   MONOABS 0.6 04/19/2021 2112   EOSABS 0.3 04/19/2021 2112   BASOSABS 0.1 04/19/2021 2112   CMP     Component Value Date/Time   NA 140 04/22/2021 0054   NA 141 03/26/2021 1632   K 3.8 04/22/2021 0054   CL 105 04/22/2021 0054   CO2 28 04/22/2021 0054   GLUCOSE 147 (H) 04/22/2021 0054   BUN 22 04/22/2021 0054   BUN 32 (H) 03/26/2021 1632   CREATININE 1.83 (H) 04/22/2021 0054   CALCIUM 9.0 04/22/2021 0054   PROT 6.1 (L) 04/22/2021 0054   PROT 6.6 03/26/2021 1632   ALBUMIN 3.3 (L) 04/22/2021 0054   ALBUMIN 4.1 10/24/2020 0943   AST 21 04/22/2021 0054   ALT 29 04/22/2021 0054   ALKPHOS 77 04/22/2021 0054   BILITOT 0.6 04/22/2021 0054   BILITOT 0.4 10/24/2020 0943   GFRNONAA 39 (L) 04/22/2021 0054   GFRAA 59 (L) 11/08/2020 1138   Lipid Panel     Component Value Date/Time   CHOL 161 10/24/2020 0943   TRIG 180 (H) 10/24/2020 0943   HDL 30 (L) 10/24/2020 0943   CHOLHDL 5.4 (H) 10/24/2020 0943   CHOLHDL 7.0 08/29/2019 0409   VLDL 50 (H) 08/29/2019 0409   LDLCALC 99 10/24/2020 0943   Lab Results  Component Value Date   HGBA1C 8.9 (H) 01/30/2021   Urinalysis    Component Value Date/Time   COLORURINE YELLOW 04/21/2021 1610   APPEARANCEUR HAZY (A) 04/21/2021 1610  LABSPEC 1.018 04/21/2021 1610   PHURINE 6.0 04/21/2021 1610   GLUCOSEU 150 (A) 04/21/2021 1610   HGBUR MODERATE (A) 04/21/2021 1610   BILIRUBINUR NEGATIVE 04/21/2021 [place] 04/21/2021 1610   PROTEINUR 100 (A) 04/21/2021 1610   UROBILINOGEN 0.2  04/08/2011 0732   NITRITE NEGATIVE 04/21/2021 1610   [place] 04/21/2021 1610   Results for orders placed or performed during the hospital encounter of 04/21/21  Culture, blood (routine x 2)     Status: None (Preliminary result)   Collection Time: 04/21/21  1:17 PM   Specimen: BLOOD RIGHT FOREARM  Result Value Ref Range Status   Specimen Description BLOOD RIGHT FOREARM  Final   Special Requests   Final    BOTTLES DRAWN AEROBIC AND ANAEROBIC Blood Culture results may not be optimal due to an inadequate volume of blood received in culture bottles   Culture   Final    NO GROWTH < 24 HOURS Performed at [facility], [place] [address], [place], [place] [address]    Report Status PENDING  Incomplete  Culture, blood (routine x 2)     Status: None (Preliminary result)   Collection Time: 04/21/21  1:22 PM   Specimen: BLOOD  Result Value Ref Range Status   Specimen Description BLOOD SITE NOT SPECIFIED  Final   Special Requests   Final    BOTTLES DRAWN AEROBIC AND ANAEROBIC Blood Culture results may not be optimal due to an inadequate volume of blood received in culture bottles   Culture   Final    NO GROWTH < 24 HOURS Performed at [facility], [place] [address], [place], [place] [address]    Report Status PENDING  Incomplete  Resp Panel by RT-PCR (Flu A&B, Covid) Urine, Clean Catch     Status: None   Collection Time: 04/21/21  4:18 PM   Specimen: Urine, Clean Catch; Nasopharyngeal(NP) swabs in vial transport medium  Result Value Ref Range Status   SARS Coronavirus 2 by RT PCR NEGATIVE NEGATIVE Final    Comment: (NOTE) SARS-CoV-2 target nucleic acids are NOT DETECTED.  The SARS-CoV-2 RNA is generally detectable in upper respiratory specimens during the acute phase of infection. The lowest concentration of SARS-CoV-2 viral copies this assay can detect is 138 copies/mL. A negative result does not preclude SARS-Cov-2 infection and should not be used as the sole basis  for treatment or other patient management decisions. A negative result may occur with  improper specimen collection/handling, submission of specimen other than nasopharyngeal swab, presence of viral mutation(s) within the areas targeted by this assay, and inadequate number of viral copies(<138 copies/mL). A negative result must be combined with clinical observations, patient history, and epidemiological information. The expected result is Negative.  Fact Sheet for Patients:  EntrepreneurPulse.com.au  Fact Sheet for Healthcare Providers:  IncredibleEmployment.be  This test is no t yet approved or cleared by the [place] FDA and  has been authorized for detection and/or diagnosis of SARS-CoV-2 by FDA under an Emergency Use Authorization (EUA). This EUA will remain  in effect (meaning this test can be used) for the duration of the COVID-19 declaration under Section 564(b)(1) of the Act, 21 U.S.C.section 360bbb-3(b)(1), unless the authorization is terminated  or revoked sooner.       Influenza A by PCR NEGATIVE NEGATIVE Final   Influenza B by PCR NEGATIVE NEGATIVE Final    Comment: (NOTE) The Xpert Xpress SARS-CoV-2/FLU/RSV plus assay is intended as an aid in the  diagnosis of influenza from Nasopharyngeal swab specimens and should not be used as a sole basis for treatment. Nasal washings and aspirates are unacceptable for Xpert Xpress SARS-CoV-2/FLU/RSV testing.  Fact Sheet for Patients: EntrepreneurPulse.com.au  Fact Sheet for Healthcare Providers: IncredibleEmployment.be  This test is not yet approved or cleared by the [place] FDA and has been authorized for detection and/or diagnosis of SARS-CoV-2 by FDA under an Emergency Use Authorization (EUA). This EUA will remain in effect (meaning this test can be used) for the duration of the COVID-19 declaration under Section 564(b)(1) of the Act, 21  U.S.C. section 360bbb-3(b)(1), unless the authorization is terminated or revoked.  Performed at [facility], [place] [address], [place], [place] [address]    Imaging I have reviewed the images obtained: CT-scan of the brain: 1. No CT evidence for acute intracranial abnormality. Atrophy and mild chronic small vessel ischemic changes of the white matter. Chronic lacunar infarcts right basal ganglia and white matter 2. Similar positioning of left deep brain stimulator  Assessment: 70 year old male with multiple stroke risk factors who presented to the ED for evaluation of bacteremia on blood culture results from an OSH on 5/26 with patient complaints of slurred speech, gait disturbance, and right-sided weakness. CTH negative and have not yet been able to obtain an MRI.  Suspect he may have suffered a left cortical hand area stroke.  - Stroke risk factors include hypertension, hyperlipidemia, obesity, type 2 diabetes mellitus, history of CVA and TIAs, history of DVT / PE, and age.  - Initial CT without acute intracranial abnormality but with chronic small vessel ischemic changes and chronic lacunar infarcts of the right basal ganglia and white matter. Initially unable to obtain MRI in [place] due to patient not having his device to turn off his DBS. Once he was able to obtain his device, the DBS was unable to be set to MRI mode for MRI brain evaluation due to high electrode imepedence error (though device appears to be functioning appropriately based on symptom control). DBS manufacturer has been contacted to see if able to troubleshoot DBS for MRI evaluation.   - ID and cardiology on board for possible staphylococcal bacteremia- unclear whether patient has true bacteremia without symptoms of infection, leukocytosis, or fever. Further blood cultures pending and patient remains on empiric antibiotic therapy as endocarditis with bacteremia is a possible etiology for acute stroke.  Given that he  did have a Staphylococcus species both in [place] as well as on initial blood culture here, cannot ignore the possibility of bacteremia/endocarditis - Indication for Eliquis appears to be prior left subclavian thrombus which has not been reimaged in over a year.  Repeat CTA to determine if anticoagulation for this indication is still needed from a stroke prevention perspective.  Given DVT/PE appear to have been provoked in nature not clear if he has other indications for anticoagulation but defer this to medicine team  Impression: - Concern for acute ischemic stroke +/- recrudescence of previous stroke - Concern for endocarditis and bacteremia   Recommendations: - MRI brain if / when able to safely obtain, continue to reach out to DBS manufacture for support regarding electrode impedance issues - Agree with TEE planned for tomorrow for evaluation of endocarditis per ID for further evlauation possible bacteremia (one positive blood culture in [place] and staphylococcus hominis positive blood culture from 04/19/2021) - HgbA1c, fasting lipid panel - CTA head and neck for vessel imaging- most recent GFR 39 (above threshold of 30), and there  is a contrast shortage but given inability to obtain MRI at this time as well as the fact that this will significantly change management (neurologically safe to stop anticoagulation if this thrombus has resolved), we will proceed with CTA - Frequent neuro checks - Given potential endocarditis at this time, recommend holding anticoagulation and antiplatelet therapy for now - Risk factor modification - Telemetry monitoring - PT consult, OT consult, Speech consult - Stroke team to follow  Pt seen by NP/Neuro and later by MD. Note/plan to be edited by MD as needed.  Anibal Henderson, AGAC-NP [facility] Pager: [phone]  Attending Neurologist's note:  I personally saw this patient, gathering history, performing a full neurologic  examination, reviewing relevant labs, personally reviewing relevant imaging including head CT, and formulated the assessment and plan, adding the note above for completeness and clarity to accurately reflect my thoughts

## 2021-04-22 NOTE — Evaluation (Signed)
Occupational Therapy Evaluation Patient Details Name: Joshua [name] MRN: [id number] DOB: [birth year] Today's Date: 04/22/2021    History of Present Illness Joshua [name] is a 70 y.o. male presenting to ED on 5/29 with persistent slurred speech and R-sided weakness. Patient taken to [facility] in [place] where he was visiting his son at time of symptom onset. Blood cultures 5/26 positive. Patient admitted with acute staphylococcal bacteremia. MRI pending secondary to deep brain stimulator. PMHx significant for hypertrophic cardiomyopathy, HTN, DMII, CHF, OSA on CPAP, CKD stage IIIa, PE after knee surgery, essential tremor status post deep brain stimulator 2020, Hx of CVA.   Clinical Impression   PTA patient was living with his wife in a handicapped accessible private residence and was grossly I with ADLs/IADLs without AD. Patient currently functioning slightly below baseline demonstrating observed ADLs including grooming standing at sink level and LB dressing with Min guard to Min A. Patient also limited by deficits listed below including decreased [place] in R hand and decreased balance indicating increased risk of falls and would benefit from continued acute OT services in prep for safe d/c home. OT to provide RUE HEP at time of next session as patient prefers no outpatient follow-up.      Follow Up Recommendations  No OT follow up (Patient declining outpatient neuro OT follow-up)    Equipment Recommendations  None recommended by OT    Recommendations for Other Services       Precautions / Restrictions Precautions Precautions: Fall Restrictions Weight Bearing Restrictions: No      Mobility Bed Mobility               General bed mobility comments: Seated in recliner upon entry.    Transfers Overall transfer level: Needs assistance Equipment used: None Transfers: Sit to/from Stand Sit to Stand: Min guard         General transfer comment: Min guard  from low recliner for steadying/safety.    Balance Overall balance assessment: Needs assistance Sitting-balance support: No upper extremity supported;Feet supported Sitting balance-Leahy Scale: Good     Standing balance support: No upper extremity supported;During functional activity Standing balance-Leahy Scale: Fair Standing balance comment: Reports increased unsteadiness compared to baseline.                           ADL either performed or assessed with clinical judgement   ADL Overall ADL's : Needs assistance/impaired     Grooming: Wash/dry hands;Oral care;Brushing hair;Min guard Grooming Details (indicate cue type and reason): 3/3 grooming tasks standing at sink level with Min guard for steadying/safety.             Lower Body Dressing: Min guard Lower Body Dressing Details (indicate cue type and reason): Able to doff/don footwear seated in recliner. Would likely require Min guard to Min A to don LB clothing in standing 2/2 balance deficits. Toilet Transfer: Magazine features editor Details (indicate cue type and reason): Simulated with transfer to recliner without AD and Min guard for safety/steadying.                 Vision Baseline Vision/History: Wears glasses Wears Glasses: At all times Patient Visual Report: No change from baseline       Perception     Praxis      Pertinent Vitals/Pain Pain Assessment: No/denies pain     Hand Dominance Right   Extremity/Trunk Assessment Upper Extremity Assessment Upper Extremity Assessment: RUE deficits/detail  RUE Deficits / Details: AROM WFL. MMT grossly 4-/5 at shoulder, elbow and wrist. Gross grasp 3+/5. RUE Sensation: WNL RUE Coordination: decreased fine motor   Lower Extremity Assessment Lower Extremity Assessment: Defer to PT evaluation   Cervical / Trunk Assessment Cervical / Trunk Assessment: Normal   Communication Communication Communication: Expressive difficulties (Mildly slurred  speech)   Cognition Arousal/Alertness: Awake/alert Behavior During Therapy: WFL for tasks assessed/performed Overall Cognitive Status: Impaired/Different from baseline Area of Impairment: Safety/judgement                         Safety/Judgement: Decreased awareness of safety;Decreased awareness of deficits         General Comments  Wife present at bedside notes increased R foot drop compared to baseline.    Exercises     Shoulder Instructions      Home Living Family/patient expects to be discharged to:: Private residence Living Arrangements: Spouse/significant other Available Help at Discharge: Family;Available 24 hours/day Type of Home: House Home Access: Stairs to enter [employer] of Steps: 5 small steps on to porch Entrance Stairs-Rails: Right;Left Home Layout: One level     Bathroom Shower/Tub: Occupational psychologist: Handicapped height Bathroom Accessibility: Yes   Home Equipment: Environmental consultant - 2 wheels;Cane - single point;Shower seat - built in;Hand held shower head;Grab bars - tub/shower   Additional Comments: Home is handicapped accessible      Prior Functioning/Environment Level of Independence: Independent        Comments: I with ADLs/IADLs without AD        OT Problem List: Decreased strength;Impaired balance (sitting and/or standing);Decreased safety awareness;Impaired UE functional use      OT Treatment/Interventions: Self-care/ADL training;Therapeutic exercise;Neuromuscular education;DME and/or AE instruction;Therapeutic activities;Patient/family education;Balance training    OT Goals(Current goals can be found in the care plan section) Acute Rehab OT Goals Patient Stated Goal: To return home. OT Goal Formulation: With patient Time For Goal Achievement: 05/06/21 Potential to Achieve Goals: Good ADL Goals Pt/caregiver will Perform Home Exercise Program: Right Upper extremity;With theraputty;With written HEP  provided Additional ADL Goal #1: Patient will complete ADLs with Mod I and AE/DME PRN.  OT Frequency: Min 3X/week   Barriers to D/C:            Co-evaluation              AM-PAC OT "6 Clicks" Daily Activity     Outcome Measure Help from another person eating meals?: None Help from another person taking care of personal grooming?: A Little Help from another person toileting, which includes using toliet, bedpan, or urinal?: A Little Help from another person bathing (including washing, rinsing, drying)?: A Little Help from another person to put on and taking off regular upper body clothing?: A Little Help from another person to put on and taking off regular lower body clothing?: A Little 6 Click Score: 19   End of Session Equipment Utilized During Treatment: Gait belt Nurse Communication: Mobility status  Activity Tolerance: Patient tolerated treatment well Patient left: in chair;with call bell/phone within reach;with chair alarm set  OT Visit Diagnosis: Unsteadiness on feet (R26.81);Muscle weakness (generalized) (M62.81);Hemiplegia and hemiparesis Hemiplegia - Right/Left: Right Hemiplegia - dominant/non-dominant: Dominant Hemiplegia - caused by: Unspecified                Time: 1308-6578 OT Time Calculation (min): 23 min Charges:  OT General Charges $OT Visit: 1 Visit OT Evaluation $OT Eval Low Complexity: 1 Low  OT Treatments $Self Care/Home Management : 8-22 mins  Burlin Mcnair H. OTR/L Supplemental OT, Department of rehab services [phone]  Delorse Shane R H. 04/22/2021, 12:38 PM

## 2021-04-22 NOTE — H&P (View-Only) (Signed)
PROGRESS NOTE  Joshua [name]  PRF:[id number] DOB: [birth year] DOA: 04/21/2021 PCP: [name], MD   Brief Narrative: Joshua [name] is a 70 y.o. 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   He had developed slurred speech and abnormal sensation of right hand while [place] state ([place] on 5[place], admitted to local hospital and subsequently discharged after CT showed no acute findings. MRI not performed due to deep brain stimulator. When he returned back near [place], he was called for 1 of 2 collections of blood cultures that were positive, directed to local ED on 5/27. He denied symptoms of bacteremia at that time. In [place] ED he was afebrile without leukocytosis, not tachycardic or hypotensive, with normal lactic acid, no evidence of infection on UA or CXR. Despite still having some right hand symptoms and slurred speech, CT head was without acute findings. Blood cultures were repeated and he was discharged, only to be called on 5/29 for Staph spp. growing in those blood cultures, prompting return to ED at [facility]. Here he has been afebrile with WBC 8.6k, no WBCs on UA, negative covid and influenza testing. Ancef was started with ID consultation, TEE requested, and the patient was admitted. Antibiotics changed to daptomycin due to possibility of resistance pending further culture data.   Assessment & Plan: Principal Problem:   Bacteremia, coagulase-negative staphylococcal Active Problems:   Diabetes mellitus type 2 in obese (HCC)   OSA on CPAP   Essential tremor   Chronic kidney disease   Obesity (BMI 30-39.9)   History of pulmonary embolism   Recent cerebrovascular accident (CVA)  Coagulase negative staphylococcal bacteremia: Suspected to be persistent from cultures at OSH 5/25 and again here 5/27 though no  clinical evidence of nidus or sepsis. - Monitor blood culture data from 5/27 - Monitor repeat blood cultures during this admission from 5/29.  - ID consulted, recommending TEE (NPO p MN in the event this is possible 5/31) to inform duration, and change ancef to daptomycin (avoiding vancomycin due to renal impairment).   Suspected recent CVA, hx CVAs with left eye visual field deficit, subclavian thrombus, continues to have slurred speech and right hand abnormality. CT head with atrophy, chronic small vessel ischemic white matter changes and chronic right basal ganglia lacunar infarcts.  - Will consult neurology for further recommendations. - Continue anticoagulation - Continue statin - PT/OT/SLP.  - DBS said not to be compatible with MRI (impedance out of range) but it appears the DBS was in place at the time of MRI brain on 08/30/2019. There is impedance error message on his remote currently. May repeat CT head in a couple days given continued high index of suspicion for stroke, though CT head was nonacute on 5/27.  Chronic HFpEF, HTN, HCM: Echo 03/08/2021 w/LVEF 55-60%, G1DD, GLS pattern, marked LVH suggestive of cardiac amyloidosis. - Monitor volume status - Continue norvasc 10mg , metoprolol 25mg  po BID, losartan 100mg , and prn lasix (not currently required) - Outpatient cardiology follow up, has scheduled appointment with Dr. Gardiner Rhyme on 6/2 and 6/7.  IDT2DM: HbA1c 8.9% in March 2022.  - Holding home metformin - Continue SSI, at inpatient goal   Stage IIIa CKD: Stable. CrCl ~65ml/min.  - Avoid nephrotoxins. This would not be contraindication to contrast in and of itself.   HLD: Note also coronary CT  calcium score was 588. - Continue atorvastatin, zetia, fenofibrate  Dyspnea, history of provoked DVT/PE: CXR clear 5/27. - Monitor closely. Without tachycardia or hypoxia, would not pursue further work up at this time.  OSA:  - CPAP  Essential tremor s/p DBS:  - Follow up with Dr. Carles Collet  6/14.  Obesity: Estimated body mass index is 31.08 kg/m as calculated from the following:   Height as of this encounter: 6\' 4"  (1.93 m).   Weight as of this encounter: 115.8 kg.  DVT prophylaxis: Eliquis Code Status: Full Family Communication: Wife at bedside Disposition Plan:  Status is: Inpatient  Remains inpatient appropriate because:Ongoing diagnostic testing needed not appropriate for outpatient work up  Dispo: The patient is from: Home              Anticipated d/c is to: Home              Patient currently is not medically stable to d/c.   Difficult to place patient No  Consultants:   Neurology   Cardiology  Infectious disease  Procedures:   TEE planned 04/23/2021  Antimicrobials:  Ancef 5/29 - 5/30  Daptomycin 5/30 >>   Subjective: Still with persistent speech changes and right hand abnormal sensation, subtle weakness. Felt fatigue and generally unwell prior to admission and states he feels much generally better today.  Objective: Vitals:   04/21/21 2244 04/22/21 0354 04/22/21 0545 04/22/21 0932  BP:  134/83  127/84  Pulse: 77 75  81  Resp: 18 18    Temp:  98.1 F (36.7 C)    TempSrc:  Oral    SpO2: 94% 95%    Weight:   115.8 kg   Height:        Intake/Output Summary (Last 24 hours) at 04/22/2021 1234 Last data filed at 04/22/2021 1100 Gross per 24 hour  Intake 340 ml  Output 925 ml  Net -585 ml   Filed Weights   04/21/21 1842 04/22/21 0545  Weight: 115.8 kg 115.8 kg   Gen: 70 y.o. male in no distress Pulm: Non-labored breathing room air. Clear to auscultation bilaterally.  CV: Regular rate and rhythm. No murmur, rub, or gallop. No JVD, no pedal edema. GI: Abdomen soft, non-tender, non-distended, with normoactive bowel sounds. No organomegaly or masses felt. Ext: Warm, no deformities Skin: No rashes, lesions or ulcers Neuro: Alert and oriented. Subtle 4/5 right hand grip, otherwise 5/5 motor function at wrists, elbows, shoulders, and  throughout LEs. No CN deficits noted. Scanning speech noted. No other focal neurological deficits. Psych: Judgement and insight appear normal. Mood & affect appropriate.   Data Reviewed: I have personally reviewed following labs and imaging studies  CBC: Recent Labs  Lab 04/19/21 2112 04/21/21 1233 04/22/21 0054  WBC 9.3 8.6 9.0  NEUTROABS 3.8  --   --   HGB 13.3 13.5 12.5*  HCT 40.0 43.4 37.9*  MCV 89.7 94.1 90.7  PLT 231 230 947   Basic Metabolic Panel: Recent Labs  Lab 04/19/21 2112 04/21/21 1233 04/22/21 0054  NA 142 141 140  K 3.6 4.1 3.8  CL 106 107 105  CO2 26 23 28   GLUCOSE 87 228* 147*  BUN 29* 21 22  CREATININE 1.75* 1.68* 1.83*  CALCIUM 8.7* 9.3 9.0   GFR: Estimated Creatinine Clearance: 51.5 mL/min (A) (by C-G formula based on SCr of 1.83 mg/dL (H)). Liver Function Tests: Recent Labs  Lab 04/19/21 2112 04/22/21 0054  AST 29 21  ALT 33 29  ALKPHOS 77 77  BILITOT 0.4 0.6  PROT 6.5 6.1*  ALBUMIN 3.9 3.3*   No results for input(s): LIPASE, AMYLASE in the last 168 hours. No results for input(s): AMMONIA in the last 168 hours. Coagulation Profile: Recent Labs  Lab 04/19/21 2120  INR 1.0   Cardiac Enzymes: Recent Labs  Lab 04/22/21 0054  CKTOTAL 115   BNP (last 3 results) No results for input(s): PROBNP in the last 8760 hours. HbA1C: No results for input(s): HGBA1C in the last 72 hours. CBG: Recent Labs  Lab 04/21/21 1513 04/21/21 1737 04/21/21 2121 04/22/21 0731 04/22/21 1116  GLUCAP 199* 166* 203* 101* 139*   Lipid Profile: No results for input(s): CHOL, HDL, LDLCALC, TRIG, CHOLHDL, LDLDIRECT in the last 72 hours. Thyroid Function Tests: No results for input(s): TSH, T4TOTAL, FREET4, T3FREE, THYROIDAB in the last 72 hours. Anemia Panel: No results for input(s): VITAMINB12, FOLATE, FERRITIN, TIBC, IRON, RETICCTPCT in the last 72 hours. Urine analysis:    Component Value Date/Time   COLORURINE YELLOW 04/21/2021 1610    APPEARANCEUR HAZY (A) 04/21/2021 1610   LABSPEC 1.018 04/21/2021 1610   PHURINE 6.0 04/21/2021 1610   GLUCOSEU 150 (A) 04/21/2021 1610   HGBUR MODERATE (A) 04/21/2021 1610   BILIRUBINUR NEGATIVE 04/21/2021 1610   KETONESUR NEGATIVE 04/21/2021 1610   PROTEINUR 100 (A) 04/21/2021 1610   UROBILINOGEN 0.2 04/08/2011 0732   NITRITE NEGATIVE 04/21/2021 1610   LEUKOCYTESUR NEGATIVE 04/21/2021 1610   Recent Results (from the past 240 hour(s))  Culture, blood (Routine X 2) w Reflex to ID Panel     Status: None (Preliminary result)   Collection Time: 04/19/21  8:50 PM   Specimen: BLOOD  Result Value Ref Range Status   Specimen Description   Final    BLOOD BLOOD LEFT FOREARM Performed at [facility], [address], [place], [place] [address]    Special Requests   Final    BOTTLES DRAWN AEROBIC AND ANAEROBIC Blood Culture adequate volume Performed at [facility], [address], [place], [place] [address]    Culture   Final    NO GROWTH 2 DAYS Performed at [facility], [place] [address] [place], [place] [address]    Report Status PENDING  Incomplete  Culture, blood (Routine X 2) w Reflex to ID Panel     Status: Abnormal (Preliminary result)   Collection Time: 04/19/21 11:20 PM   Specimen: BLOOD  Result Value Ref Range Status   Specimen Description   Final    BLOOD RIGHT HAND Performed at [facility], [address], [place], [place] [address]    Special Requests   Final    BOTTLES DRAWN AEROBIC AND ANAEROBIC Blood Culture adequate volume Performed at [facility], [address], [place], [place] [address]    Culture  Setup Time   Final    GRAM POSITIVE COCCI IN CLUSTERS AEROBIC BOTTLE ONLY CRITICAL RESULT CALLED TO, READ BACK BY AND VERIFIED WITH: Camie Patience RN @0911  04/21/21 EB    Culture (A)  Final    STAPHYLOCOCCUS HOMINIS THE SIGNIFICANCE OF ISOLATING THIS ORGANISM FROM A SINGLE SET OF  BLOOD CULTURES WHEN MULTIPLE SETS ARE DRAWN IS UNCERTAIN. PLEASE NOTIFY THE MICROBIOLOGY DEPARTMENT WITHIN ONE WEEK IF SPECIATION AND SENSITIVITIES ARE REQUIRED. Performed at [facility], [place] [address], [place], [place] [address]    Report Status PENDING  Incomplete  Blood Culture ID Panel (Reflexed)     Status: Abnormal  Collection Time: 04/19/21 11:20 PM  Result Value Ref Range Status   Enterococcus faecalis NOT DETECTED NOT DETECTED Final   Enterococcus Faecium NOT DETECTED NOT DETECTED Final   Listeria monocytogenes NOT DETECTED NOT DETECTED Final   Staphylococcus species DETECTED (A) NOT DETECTED Final    Comment: CRITICAL RESULT CALLED TO, READ BACK BY AND VERIFIED WITH: JERRI TEGRAM RN @0911  04/21/21 EB    Staphylococcus aureus (BCID) NOT DETECTED NOT DETECTED Final   Staphylococcus epidermidis NOT DETECTED NOT DETECTED Final   Staphylococcus lugdunensis NOT DETECTED NOT DETECTED Final   Streptococcus species NOT DETECTED NOT DETECTED Final   Streptococcus agalactiae NOT DETECTED NOT DETECTED Final   Streptococcus pneumoniae NOT DETECTED NOT DETECTED Final   Streptococcus pyogenes NOT DETECTED NOT DETECTED Final   A.calcoaceticus-baumannii NOT DETECTED NOT DETECTED Final   Bacteroides fragilis NOT DETECTED NOT DETECTED Final   Enterobacterales NOT DETECTED NOT DETECTED Final   Enterobacter cloacae complex NOT DETECTED NOT DETECTED Final   Escherichia coli NOT DETECTED NOT DETECTED Final   Klebsiella aerogenes NOT DETECTED NOT DETECTED Final   Klebsiella oxytoca NOT DETECTED NOT DETECTED Final   Klebsiella pneumoniae NOT DETECTED NOT DETECTED Final   Proteus species NOT DETECTED NOT DETECTED Final   Salmonella species NOT DETECTED NOT DETECTED Final   Serratia marcescens NOT DETECTED NOT DETECTED Final   Haemophilus influenzae NOT DETECTED NOT DETECTED Final   Neisseria meningitidis NOT DETECTED NOT DETECTED Final   Pseudomonas aeruginosa NOT DETECTED NOT DETECTED  Final   Stenotrophomonas maltophilia NOT DETECTED NOT DETECTED Final   Candida albicans NOT DETECTED NOT DETECTED Final   Candida auris NOT DETECTED NOT DETECTED Final   Candida glabrata NOT DETECTED NOT DETECTED Final   Candida krusei NOT DETECTED NOT DETECTED Final   Candida parapsilosis NOT DETECTED NOT DETECTED Final   Candida tropicalis NOT DETECTED NOT DETECTED Final   Cryptococcus neoformans/gattii NOT DETECTED NOT DETECTED Final    Comment: Performed at [facility], [address], [place], [place] [address]  Culture, blood (routine x 2)     Status: None (Preliminary result)   Collection Time: 04/21/21  1:17 PM   Specimen: BLOOD RIGHT FOREARM  Result Value Ref Range Status   Specimen Description BLOOD RIGHT FOREARM  Final   Special Requests   Final    BOTTLES DRAWN AEROBIC AND ANAEROBIC Blood Culture results may not be optimal due to an inadequate volume of blood received in culture bottles   Culture   Final    NO GROWTH < 24 HOURS Performed at [facility], [place] [address], [place], [place] [address]    Report Status PENDING  Incomplete  Culture, blood (routine x 2)     Status: None (Preliminary result)   Collection Time: 04/21/21  1:22 PM   Specimen: BLOOD  Result Value Ref Range Status   Specimen Description BLOOD SITE NOT SPECIFIED  Final   Special Requests   Final    BOTTLES DRAWN AEROBIC AND ANAEROBIC Blood Culture results may not be optimal due to an inadequate volume of blood received in culture bottles   Culture   Final    NO GROWTH < 24 HOURS Performed at [facility], [place] [address], [place], [place] [address]    Report Status PENDING  Incomplete  Resp Panel by RT-PCR (Flu A&B, Covid) Urine, Clean Catch     Status: None   Collection Time: 04/21/21  4:18 PM   Specimen: Urine, Clean Catch; Nasopharyngeal(NP)  swabs in vial transport medium  Result Value Ref Range Status   SARS Coronavirus 2 by RT PCR NEGATIVE NEGATIVE Final    Comment:  (NOTE) SARS-CoV-2 target nucleic acids are NOT DETECTED.  The SARS-CoV-2 RNA is generally detectable in upper respiratory specimens during the acute phase of infection. The lowest concentration of SARS-CoV-2 viral copies this assay can detect is 138 copies/mL. A negative result does not preclude SARS-Cov-2 infection and should not be used as the sole basis for treatment or other patient management decisions. A negative result may occur with  improper specimen collection/handling, submission of specimen other than nasopharyngeal swab, presence of viral mutation(s) within the areas targeted by this assay, and inadequate number of viral copies(<138 copies/mL). A negative result must be combined with clinical observations, patient history, and epidemiological information. The expected result is Negative.  Fact Sheet for Patients:  EntrepreneurPulse.com.au  Fact Sheet for Healthcare Providers:  IncredibleEmployment.be  This test is no t yet approved or cleared by the [place] FDA and  has been authorized for detection and/or diagnosis of SARS-CoV-2 by FDA under an Emergency Use Authorization (EUA). This EUA will remain  in effect (meaning this test can be used) for the duration of the COVID-19 declaration under Section 564(b)(1) of the Act, 21 U.S.C.section 360bbb-3(b)(1), unless the authorization is terminated  or revoked sooner.       Influenza A by PCR NEGATIVE NEGATIVE Final   Influenza B by PCR NEGATIVE NEGATIVE Final    Comment: (NOTE) The Xpert Xpress SARS-CoV-2/FLU/RSV plus assay is intended as an aid in the diagnosis of influenza from Nasopharyngeal swab specimens and should not be used as a sole basis for treatment. Nasal washings and aspirates are unacceptable for Xpert Xpress SARS-CoV-2/FLU/RSV testing.  Fact Sheet for Patients: EntrepreneurPulse.com.au  Fact Sheet for Healthcare  Providers: IncredibleEmployment.be  This test is not yet approved or cleared by the [place] FDA and has been authorized for detection and/or diagnosis of SARS-CoV-2 by FDA under an Emergency Use Authorization (EUA). This EUA will remain in effect (meaning this test can be used) for the duration of the COVID-19 declaration under Section 564(b)(1) of the Act, 21 U.S.C. section 360bbb-3(b)(1), unless the authorization is terminated or revoked.  Performed at [facility], [place] [address] [place], [place] [address]       Radiology Studies: No results found.  Scheduled Meds: . amLODipine  10 mg Oral QHS  . atorvastatin  80 mg Oral q1800  . colchicine  0.6 mg Oral BID  . DULoxetine  60 mg Oral Daily  . enoxaparin (LOVENOX) injection  115 mg Subcutaneous Q12H  . escitalopram  20 mg Oral Daily  . ezetimibe  10 mg Oral Daily  . fenofibrate  54 mg Oral Daily  . insulin aspart  0-15 Units Subcutaneous TID WC  . insulin glargine  80 Units Subcutaneous BID  . losartan  100 mg Oral Daily  . metoprolol tartrate  25 mg Oral BID  . mirtazapine  7.5 mg Oral QHS  . sodium chloride flush  3 mL Intravenous Q12H   Continuous Infusions: . DAPTOmycin (CUBICIN)  IV       LOS: 1 day   Time spent: 35 minutes.  Patrecia Pour, MD [facility] [email or web address] 04/22/2021, 12:34 PM

## 2021-04-22 NOTE — Consult Note (Signed)
[facility]  Total days of antibiotics 2       Reason for Consult: bacteremia    Referring Physician: grunz Principal Problem:   Bacteremia, coagulase-negative staphylococcal Active Problems:   Diabetes mellitus type 2 in obese (HCC)   OSA on CPAP   Essential tremor   Chronic kidney disease   Obesity (BMI 30-39.9)   History of pulmonary embolism   Recent cerebrovascular accident (CVA)    HPI: Joshua [name] is a 70 y.o. male with history DBS for essential tremor, HX of CVA with left eye central blindness, hx of TIA, hx of DVT who had sudden onset of [place] He was taken to nearest hospital in [place] for evaluation. He was unable to undergo MRI evaluation for stroke due to his DBS. He had 1 set of blood cx drawn that grew 1 of 2 bottles with staph epi (sensitivities not done). He denied any systemic symptoms of fever/chills/nightsweats. No recent dental work. He was instructed to go do nearest ED for evaluation when he was returning home by car. He went to the ED on 5/27, where 2 sets of blood cx were drawn, - with 1 of 4 bottles + for staph species at  36 hr mark. He was admitted for further evaluation and completion of stroke work-up. The patient reports that he still has sequelae of slurred speech as well as right fingertip weakness/numbness. Still denies any fever/chills/nightsweats. An additional 2 set of blood cx have been drawn. Decision to try to have DBS turned off while getting MRI is being pursued for stroke work up. He is slated for TEE in the morning.  He has upcoming appt with dr tat(neuro) and Virgina Jock (PCP) and his cardiologist this coming week.  Past Medical History:  Diagnosis Date  . Benign essential tremor   . Benign positional vertigo   . CVA (cerebral vascular accident) ([facility])    x2 - L retina, 1 right parietal  . Degenerative arthritis   . Depression   . Diabetes mellitus   . DVT (deep venous  thrombosis) ([place]) 2018  . Dyslipidemia   . GERD (gastroesophageal reflux disease)    hiatal hernia  . Gout   . H/O: vasectomy   . Hearing aid worn    b/l  . Hx of appendectomy   . Hx of tonsillectomy   . Hypertension   . Hypertrophic cardiomyopathy ([place])   . 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 Melanoma ([facility])   . NSVT (nonsustained ventricular tachycardia) (HCC)    1 4 beat run on event monitor in 07/2020  . Obesity   . OSA on CPAP    setting = 5  . Pulmonary emboli ([place]) 2018  . PVC's (premature ventricular contractions)   . SVT (supraventricular tachycardia) (HCC)    by event monitor  . Tremor, essential 06/22/2017  . Wears glasses     Allergies:  Allergies  Allergen Reactions  . Melatonin Other (See Comments)    dizzy    MEDICATIONS: . amLODipine  10 mg Oral QHS  . atorvastatin  80 mg Oral q1800  . colchicine  0.6 mg Oral BID  . DULoxetine  60 mg Oral Daily  . enoxaparin (LOVENOX) injection  115 mg Subcutaneous Q12H  . escitalopram  20 mg Oral Daily  . ezetimibe  10 mg Oral Daily  . fenofibrate  54 mg Oral Daily  . insulin  aspart  0-15 Units Subcutaneous TID WC  . insulin glargine  80 Units Subcutaneous BID  . losartan  100 mg Oral Daily  . metoprolol tartrate  25 mg Oral BID  . mirtazapine  7.5 mg Oral QHS  . sodium chloride flush  3 mL Intravenous Q12H    Social History   Tobacco Use  . Smoking status: Former Smoker    Packs/day: 1.00    Years: 10.00    Pack years: 10.00    Types: Cigarettes    Quit date: 11/25/1983    Years since quitting: 37.4  . Smokeless tobacco: Never Used  Vaping Use  . Vaping Use: Never used  Substance Use Topics  . Alcohol use: Yes    Alcohol/week: 2.0 standard drinks    Types: 2 Standard drinks or equivalent per week    Comment: occassionally  . Drug use: No    Family History  Problem Relation Age of Onset  . Cerebral aneurysm Mother   . Tremor Mother   . Alzheimer's disease Father   . Tremor Brother   .  Tremor Maternal Uncle   . Diabetes Maternal Grandmother   . Healthy Son   . Colon cancer Neg Hx      Review of Systems  Constitutional: Negative for fever, chills, diaphoresis, activity change, appetite change, fatigue and unexpected weight change.  HENT: Negative for congestion, sore throat, rhinorrhea, sneezing, trouble swallowing and sinus pressure.  Eyes: Negative for photophobia and visual disturbance.  Respiratory: Negative for cough, chest tightness, shortness of breath, wheezing and stridor.  Cardiovascular: Negative for chest pain, palpitations and leg swelling.  Gastrointestinal: Negative for nausea, vomiting, abdominal pain, diarrhea, constipation, blood in stool, abdominal distention and anal bleeding.  Genitourinary: Negative for dysuria, hematuria, flank pain and difficulty urinating.  Musculoskeletal: Negative for myalgias, back pain, joint swelling, arthralgias and gait problem.  Skin: Negative for color change, pallor, rash and wound.  Neurological: +slurred speech and right finger weakness; +tremors at baseline. Negative for dizziness,   and light-headedness.  Hematological: Negative for adenopathy. Does not bruise/bleed easily.  Psychiatric/Behavioral: Negative for behavioral problems, confusion, sleep disturbance, dysphoric mood, decreased concentration and agitation.     OBJECTIVE: Temp:  [98 F (36.7 C)-98.9 F (37.2 C)] 98.1 F (36.7 C) (05/30 0354) Pulse Rate:  [75-105] 81 (05/30 0932) Resp:  [14-22] 18 (05/30 0354) BP: (127-164)/(79-111) 127/84 (05/30 0932) SpO2:  [94 %-100 %] 95 % (05/30 0354) Weight:  [115.8 kg] 115.8 kg (05/30 0545) Physical Exam  Constitutional: He is oriented to person, place, and time. He appears well-developed and well-nourished. No distress.  HENT:  Mouth/Throat: Oropharynx is clear and moist. No oropharyngeal exudate.  Cardiovascular: Normal rate, regular rhythm and normal heart sounds. Exam reveals no gallop and no friction rub.   No murmur heard.  Pulmonary/Chest: Effort normal and breath sounds normal. No respiratory distress. He has no wheezes.  Abdominal: Soft. Bowel sounds are normal. He exhibits no distension. There is no tenderness.  Lymphadenopathy:  He has no cervical adenopathy.  Neurological: He is alert and oriented to person, place, and time. CN2-12 intact except L-eye central blindness (not new). Slurred speech Skin: Skin is warm and dry. No rash noted. No erythema.  Psychiatric: He has a normal mood and affect. His behavior is normal.    LABS: Results for orders placed or performed during the hospital encounter of 04/21/21 (from the past 48 hour(s))  Basic metabolic panel     Status: Abnormal   Collection Time:  04/21/21 12:33 PM  Result Value Ref Range   Sodium 141 135 - 145 mmol/L   Potassium 4.1 3.5 - 5.1 mmol/L   Chloride 107 98 - 111 mmol/L   CO2 23 22 - 32 mmol/L   Glucose, Bld 228 (H) 70 - 99 mg/dL    Comment: Glucose reference range applies only to samples taken after fasting for at least 8 hours.   BUN 21 8 - 23 mg/dL   Creatinine, Ser 1.68 (H) 0.61 - 1.24 mg/dL   Calcium 9.3 8.9 - 10.3 mg/dL   GFR, Estimated 43 (L) >60 mL/min    Comment: (NOTE) Calculated using the CKD-EPI Creatinine Equation (2021)    Anion gap 11 5 - 15    Comment: Performed at [facility] [address], [place], [place] [address]  CBC     Status: None   Collection Time: 04/21/21 12:33 PM  Result Value Ref Range   WBC 8.6 4.0 - 10.5 K/uL   RBC 4.61 4.22 - 5.81 MIL/uL   Hemoglobin 13.5 13.0 - 17.0 g/dL   HCT 43.4 39.0 - 52.0 %   MCV 94.1 80.0 - 100.0 fL   MCH 29.3 26.0 - 34.0 pg   MCHC 31.1 30.0 - 36.0 g/dL   RDW 13.2 11.5 - 15.5 %   Platelets 230 150 - 400 K/uL   nRBC 0.0 0.0 - 0.2 %    Comment: Performed at [facility], [place] [address], [place], [place] [address]  Culture, blood (routine x 2)     Status: None (Preliminary result)   Collection Time: 04/21/21  1:17 PM   Specimen: BLOOD  RIGHT FOREARM  Result Value Ref Range   Specimen Description BLOOD RIGHT FOREARM    Special Requests      BOTTLES DRAWN AEROBIC AND ANAEROBIC Blood Culture results may not be optimal due to an inadequate volume of blood received in culture bottles   Culture      NO GROWTH < 24 HOURS Performed at [facility], [place] [address] [place], [place] [address]    Report Status PENDING   Culture, blood (routine x 2)     Status: None (Preliminary result)   Collection Time: 04/21/21  1:22 PM   Specimen: BLOOD  Result Value Ref Range   Specimen Description BLOOD SITE NOT SPECIFIED    Special Requests      BOTTLES DRAWN AEROBIC AND ANAEROBIC Blood Culture results may not be optimal due to an inadequate volume of blood received in culture bottles   Culture      NO GROWTH < 24 HOURS Performed at [facility] [address] [place], [place] [address]    Report Status PENDING   CBG monitoring, ED     Status: Abnormal   Collection Time: 04/21/21  3:13 PM  Result Value Ref Range   Glucose-Capillary 199 (H) 70 - 99 mg/dL    Comment: Glucose reference range applies only to samples taken after fasting for at least 8 hours.  Urinalysis, Routine w reflex microscopic Urine, Clean Catch     Status: Abnormal   Collection Time: 04/21/21  4:10 PM  Result Value Ref Range   Color, Urine YELLOW YELLOW   APPearance HAZY (A) CLEAR   Specific Gravity, Urine 1.018 1.005 - 1.030   pH 6.0 5.0 - 8.0   Glucose, UA 150 (A) NEGATIVE mg/dL   Hgb urine dipstick MODERATE (A) NEGATIVE   Bilirubin Urine NEGATIVE NEGATIVE  Ketones, ur NEGATIVE NEGATIVE mg/dL   Protein, ur 100 (A) NEGATIVE mg/dL   Nitrite NEGATIVE NEGATIVE   Leukocytes,Ua NEGATIVE NEGATIVE   RBC / HPF 21-50 0 - 5 RBC/hpf   WBC, UA 0-5 0 - 5 WBC/hpf   Bacteria, UA NONE SEEN NONE SEEN   Squamous Epithelial / LPF 0-5 0 - 5    Comment: Performed at [facility], [place] [address], [place], [place] [address]  Brain natriuretic peptide      Status: None   Collection Time: 04/21/21  4:13 PM  Result Value Ref Range   B Natriuretic Peptide 13.0 0.0 - 100.0 pg/mL    Comment: Performed at [facility] [address] [place], [place] [address]  Resp Panel by RT-PCR (Flu A&B, Covid) Urine, Clean Catch     Status: None   Collection Time: 04/21/21  4:18 PM   Specimen: Urine, Clean Catch; Nasopharyngeal(NP) swabs in vial transport medium  Result Value Ref Range   SARS Coronavirus 2 by RT PCR NEGATIVE NEGATIVE    Comment: (NOTE) SARS-CoV-2 target nucleic acids are NOT DETECTED.  The SARS-CoV-2 RNA is generally detectable in upper respiratory specimens during the acute phase of infection. The lowest concentration of SARS-CoV-2 viral copies this assay can detect is 138 copies/mL. A negative result does not preclude SARS-Cov-2 infection and should not be used as the sole basis for treatment or other patient management decisions. A negative result may occur with  improper specimen collection/handling, submission of specimen other than nasopharyngeal swab, presence of viral mutation(s) within the areas targeted by this assay, and inadequate number of viral copies(<138 copies/mL). A negative result must be combined with clinical observations, patient history, and epidemiological information. The expected result is Negative.  Fact Sheet for Patients:  EntrepreneurPulse.com.au  Fact Sheet for Healthcare Providers:  IncredibleEmployment.be  This test is no t yet approved or cleared by the [place] FDA and  has been authorized for detection and/or diagnosis of SARS-CoV-2 by FDA under an Emergency Use Authorization (EUA). This EUA will remain  in effect (meaning this test can be used) for the duration of the COVID-19 declaration under Section 564(b)(1) of the Act, 21 U.S.C.section 360bbb-3(b)(1), unless the authorization is terminated  or revoked sooner.       Influenza A by PCR  NEGATIVE NEGATIVE   Influenza B by PCR NEGATIVE NEGATIVE    Comment: (NOTE) The Xpert Xpress SARS-CoV-2/FLU/RSV plus assay is intended as an aid in the diagnosis of influenza from Nasopharyngeal swab specimens and should not be used as a sole basis for treatment. Nasal washings and aspirates are unacceptable for Xpert Xpress SARS-CoV-2/FLU/RSV testing.  Fact Sheet for Patients: EntrepreneurPulse.com.au  Fact Sheet for Healthcare Providers: IncredibleEmployment.be  This test is not yet approved or cleared by the [place] FDA and has been authorized for detection and/or diagnosis of SARS-CoV-2 by FDA under an Emergency Use Authorization (EUA). This EUA will remain in effect (meaning this test can be used) for the duration of the COVID-19 declaration under Section 564(b)(1) of the Act, 21 U.S.C. section 360bbb-3(b)(1), unless the authorization is terminated or revoked.  Performed at [facility], [place] [address], [place], [place] [address]   Glucose, capillary     Status: Abnormal   Collection Time: 04/21/21  5:37 PM  Result Value Ref Range   Glucose-Capillary 166 (H) 70 - 99 mg/dL    Comment: Glucose reference range applies only to samples taken after fasting for at least  8 hours.  Glucose, capillary     Status: Abnormal   Collection Time: 04/21/21  9:21 PM  Result Value Ref Range   Glucose-Capillary 203 (H) 70 - 99 mg/dL    Comment: Glucose reference range applies only to samples taken after fasting for at least 8 hours.  CBC     Status: Abnormal   Collection Time: 04/22/21 12:54 AM  Result Value Ref Range   WBC 9.0 4.0 - 10.5 K/uL   RBC 4.18 (L) 4.22 - 5.81 MIL/uL   Hemoglobin 12.5 (L) 13.0 - 17.0 g/dL   HCT 37.9 (L) 39.0 - 52.0 %   MCV 90.7 80.0 - 100.0 fL   MCH 29.9 26.0 - 34.0 pg   MCHC 33.0 30.0 - 36.0 g/dL   RDW 13.1 11.5 - 15.5 %   Platelets 206 150 - 400 K/uL   nRBC 0.0 0.0 - 0.2 %    Comment: Performed at [facility], [place] [address] [place], [place] [address]  Comprehensive metabolic panel     Status: Abnormal   Collection Time: 04/22/21 12:54 AM  Result Value Ref Range   Sodium 140 135 - 145 mmol/L   Potassium 3.8 3.5 - 5.1 mmol/L   Chloride 105 98 - 111 mmol/L   CO2 28 22 - 32 mmol/L   Glucose, Bld 147 (H) 70 - 99 mg/dL    Comment: Glucose reference range applies only to samples taken after fasting for at least 8 hours.   BUN 22 8 - 23 mg/dL   Creatinine, Ser 1.83 (H) 0.61 - 1.24 mg/dL   Calcium 9.0 8.9 - 10.3 mg/dL   Total Protein 6.1 (L) 6.5 - 8.1 g/dL   Albumin 3.3 (L) 3.5 - 5.0 g/dL   AST 21 15 - 41 U/L   ALT 29 0 - 44 U/L   Alkaline Phosphatase 77 38 - 126 U/L   Total Bilirubin 0.6 0.3 - 1.2 mg/dL   GFR, Estimated 39 (L) >60 mL/min    Comment: (NOTE) Calculated using the CKD-EPI Creatinine Equation (2021)    Anion gap 7 5 - 15    Comment: Performed at [facility], [place] [address], [place], [place] [address]  CK     Status: None   Collection Time: 04/22/21 12:54 AM  Result Value Ref Range   Total CK 115 49 - 397 U/L    Comment: Performed at [facility], [place] [address] [place], [place] [address]  Glucose, capillary     Status: Abnormal   Collection Time: 04/22/21  7:31 AM  Result Value Ref Range   Glucose-Capillary 101 (H) 70 - 99 mg/dL    Comment: Glucose reference range applies only to samples taken after fasting for at least 8 hours.  Glucose, capillary     Status: Abnormal   Collection Time: 04/22/21 11:16 AM  Result Value Ref Range   Glucose-Capillary 139 (H) 70 - 99 mg/dL    Comment: Glucose reference range applies only to samples taken after fasting for at least 8 hours.    MICRO: Outside records: 5/25 blood cx 1 of 2 bottles staph epi 5/27 blood cx 1 of 4 bottles staph species (identification pending) IMAGING: No results found.   Assessment/Plan:  70yo M with new onset stroke on 5/25 with possible bacteremia. Difficult without further micro data  if the patient has indolent bacteremia/endocarditis that could be the cause of his stroke last week.   - recommend to switch to  daptomycin (8mg /kg) in the time being to cover for methicillin resistance species - await species identification and susceptibility testing on blood cx from 5/27. Follow other blood cx from 5/29 to decide if this is true bacteremia - agree with plan to get TEE to help also decide length of treatment course  Stroke work up = [facility] being contacted to troubleshoot DBS in order to see if can be temporarily turned off for the purposes of brain mri for evaluation and management of stroke  ckd 3= choosing to avoid vancomycin for the time being. Renally dose daptomycin if needed  Deloyce Walthers B. [facility] [phone]

## 2021-04-22 NOTE — Progress Notes (Signed)
PROGRESS NOTE  Joshua [name]  VOZ:[id number] DOB: 10/28/1951 DOA: 04/21/2021 PCP: Joshua Baton, MD   Brief Narrative: Joshua [name] is a 70 y.o. 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   He had developed slurred speech and abnormal sensation of right hand while [place] ([place] on 5[place], admitted to local hospital and subsequently discharged after CT showed no acute findings. MRI not performed due to deep brain stimulator. When he returned back near [place], he was called for 1 of 2 collections of blood cultures that were positive, directed to local ED on 5/27. He denied symptoms of bacteremia at that time. In [place] ED he was afebrile without leukocytosis, not tachycardic or hypotensive, with normal lactic acid, no evidence of infection on UA or CXR. Despite still having some right hand symptoms and slurred speech, CT head was without acute findings. Blood cultures were repeated and he was discharged, only to be called on 5/29 for Staph spp. growing in those blood cultures, prompting return to ED at [facility]. Here he has been afebrile with WBC 8.6k, no WBCs on UA, negative covid and influenza testing. Ancef was started with ID consultation, TEE requested, and the patient was admitted. Antibiotics changed to daptomycin due to possibility of resistance pending further culture data.   Assessment & Plan: Principal Problem:   Bacteremia, coagulase-negative staphylococcal Active Problems:   Diabetes mellitus type 2 in obese (HCC)   OSA on CPAP   Essential tremor   Chronic kidney disease   Obesity (BMI 30-39.9)   History of pulmonary embolism   Recent cerebrovascular accident (CVA)  Coagulase negative staphylococcal bacteremia: Suspected to be persistent from cultures at OSH 5/25 and again here 5/27 though no  clinical evidence of nidus or sepsis. - Monitor blood culture data from 5/27 - Monitor repeat blood cultures during this admission from 5/29.  - ID consulted, recommending TEE (NPO p MN in the event this is possible 5/31) to inform duration, and change ancef to daptomycin (avoiding vancomycin due to renal impairment).   Suspected recent CVA, hx CVAs with left eye visual field deficit, subclavian thrombus, continues to have slurred speech and right hand abnormality. CT head with atrophy, chronic small vessel ischemic white matter changes and chronic right basal ganglia lacunar infarcts.  - Will consult neurology for further recommendations. - Continue anticoagulation - Continue statin - PT/OT/SLP.  - DBS said not to be compatible with MRI (impedance out of range) but it appears the DBS was in place at the time of MRI brain on 08/30/2019. There is impedance error message on his remote currently. May repeat CT head in a couple days given continued high index of suspicion for stroke, though CT head was nonacute on 5/27.  Chronic HFpEF, HTN, HCM: Echo 03/08/2021 w/LVEF 55-60%, G1DD, GLS pattern, marked LVH suggestive of cardiac amyloidosis. - Monitor volume status - Continue norvasc 10mg , metoprolol 25mg  po BID, losartan 100mg , and prn lasix (not currently required) - Outpatient cardiology follow up, has scheduled appointment with Joshua [name] on 6/2 and 6/7.  IDT2DM: HbA1c 8.9% in March 2022.  - Holding home metformin - Continue SSI, at inpatient goal   Stage IIIa CKD: Stable. CrCl ~27ml/min.  - Avoid nephrotoxins. This would not be contraindication to contrast in and of itself.   HLD: Note also coronary CT  calcium score was 588. - Continue atorvastatin, zetia, fenofibrate  Dyspnea, history of provoked DVT/PE: CXR clear 5/27. - Monitor closely. Without tachycardia or hypoxia, would not pursue further work up at this time.  OSA:  - CPAP  Essential tremor s/p DBS:  - Follow up with Joshua [name]  6/14.  Obesity: Estimated body mass index is 31.08 kg/m as calculated from the following:   Height as of this encounter: 6\' 4"  (1.93 m).   Weight as of this encounter: 115.8 kg.  DVT prophylaxis: Eliquis Code Status: Full Family Communication: Wife at bedside Disposition Plan:  Status is: Inpatient  Remains inpatient appropriate because:Ongoing diagnostic testing needed not appropriate for outpatient work up  Dispo: The patient is from: Home              Anticipated d/c is to: Home              Patient currently is not medically stable to d/c.   Difficult to place patient No  Consultants:   Neurology   Cardiology  Infectious disease  Procedures:   TEE planned 04/23/2021  Antimicrobials:  Ancef 5/29 - 5/30  Daptomycin 5/30 >>   Subjective: Still with persistent speech changes and right hand abnormal sensation, subtle weakness. Felt fatigue and generally unwell prior to admission and states he feels much generally better today.  Objective: Vitals:   04/21/21 2244 04/22/21 0354 04/22/21 0545 04/22/21 0932  BP:  134/83  127/84  Pulse: 77 75  81  Resp: 18 18    Temp:  98.1 F (36.7 C)    TempSrc:  Oral    SpO2: 94% 95%    Weight:   115.8 kg   Height:        Intake/Output Summary (Last 24 hours) at 04/22/2021 1234 Last data filed at 04/22/2021 1100 Gross per 24 hour  Intake 340 ml  Output 925 ml  Net -585 ml   Filed Weights   04/21/21 1842 04/22/21 0545  Weight: 115.8 kg 115.8 kg   Gen: 70 y.o. male in no distress Pulm: Non-labored breathing room air. Clear to auscultation bilaterally.  CV: Regular rate and rhythm. No murmur, rub, or gallop. No JVD, no pedal edema. GI: Abdomen soft, non-tender, non-distended, with normoactive bowel sounds. No organomegaly or masses felt. Ext: Warm, no deformities Skin: No rashes, lesions or ulcers Neuro: Alert and oriented. Subtle 4/5 right hand grip, otherwise 5/5 motor function at wrists, elbows, shoulders, and  throughout LEs. No CN deficits noted. Scanning speech noted. No other focal neurological deficits. Psych: Judgement and insight appear normal. Mood & affect appropriate.   Data Reviewed: I have personally reviewed following labs and imaging studies  CBC: Recent Labs  Lab 04/19/21 2112 04/21/21 1233 04/22/21 0054  WBC 9.3 8.6 9.0  NEUTROABS 3.8  --   --   HGB 13.3 13.5 12.5*  HCT 40.0 43.4 37.9*  MCV 89.7 94.1 90.7  PLT 231 230 893   Basic Metabolic Panel: Recent Labs  Lab 04/19/21 2112 04/21/21 1233 04/22/21 0054  NA 142 141 140  K 3.6 4.1 3.8  CL 106 107 105  CO2 26 23 28   GLUCOSE 87 228* 147*  BUN 29* 21 22  CREATININE 1.75* 1.68* 1.83*  CALCIUM 8.7* 9.3 9.0   GFR: Estimated Creatinine Clearance: 51.5 mL/min (A) (by C-G formula based on SCr of 1.83 mg/dL (H)). Liver Function Tests: Recent Labs  Lab 04/19/21 2112 04/22/21 0054  AST 29 21  ALT 33 29  ALKPHOS 77 77  BILITOT 0.4 0.6  PROT 6.5 6.1*  ALBUMIN 3.9 3.3*   No results for input(s): LIPASE, AMYLASE in the last 168 hours. No results for input(s): AMMONIA in the last 168 hours. Coagulation Profile: Recent Labs  Lab 04/19/21 2120  INR 1.0   Cardiac Enzymes: Recent Labs  Lab 04/22/21 0054  CKTOTAL 115   BNP (last 3 results) No results for input(s): PROBNP in the last 8760 hours. HbA1C: No results for input(s): HGBA1C in the last 72 hours. CBG: Recent Labs  Lab 04/21/21 1513 04/21/21 1737 04/21/21 2121 04/22/21 0731 04/22/21 1116  GLUCAP 199* 166* 203* 101* 139*   Lipid Profile: No results for input(s): CHOL, HDL, LDLCALC, TRIG, CHOLHDL, LDLDIRECT in the last 72 hours. Thyroid Function Tests: No results for input(s): TSH, T4TOTAL, FREET4, T3FREE, THYROIDAB in the last 72 hours. Anemia Panel: No results for input(s): VITAMINB12, FOLATE, FERRITIN, TIBC, IRON, RETICCTPCT in the last 72 hours. Urine analysis:    Component Value Date/Time   COLORURINE YELLOW 04/21/2021 1610    APPEARANCEUR HAZY (A) 04/21/2021 1610   LABSPEC 1.018 04/21/2021 1610   PHURINE 6.0 04/21/2021 1610   GLUCOSEU 150 (A) 04/21/2021 1610   HGBUR MODERATE (A) 04/21/2021 1610   BILIRUBINUR NEGATIVE 04/21/2021 1610   KETONESUR NEGATIVE 04/21/2021 1610   PROTEINUR 100 (A) 04/21/2021 1610   UROBILINOGEN 0.2 04/08/2011 0732   NITRITE NEGATIVE 04/21/2021 1610   LEUKOCYTESUR NEGATIVE 04/21/2021 1610   Recent Results (from the past 240 hour(s))  Culture, blood (Routine X 2) w Reflex to ID Panel     Status: None (Preliminary result)   Collection Time: 04/19/21  8:50 PM   Specimen: BLOOD  Result Value Ref Range Status   Specimen Description   Final    BLOOD BLOOD LEFT FOREARM Performed at [facility], [address] [place], [place] [address]    Special Requests   Final    BOTTLES DRAWN AEROBIC AND ANAEROBIC Blood Culture adequate volume Performed at [facility], [address], [place], [place] [address]    Culture   Final    NO GROWTH 2 DAYS Performed at [facility], [place] [address] [place], [place] [address]    Report Status PENDING  Incomplete  Culture, blood (Routine X 2) w Reflex to ID Panel     Status: Abnormal (Preliminary result)   Collection Time: 04/19/21 11:20 PM   Specimen: BLOOD  Result Value Ref Range Status   Specimen Description   Final    BLOOD RIGHT HAND Performed at [facility], [address], [place], [place] [address]    Special Requests   Final    BOTTLES DRAWN AEROBIC AND ANAEROBIC Blood Culture adequate volume Performed at [facility], [address] [place], [place] [address]    Culture  Setup Time   Final    GRAM POSITIVE COCCI IN CLUSTERS AEROBIC BOTTLE ONLY CRITICAL RESULT CALLED TO, READ BACK BY AND VERIFIED WITH: JERRI TEGRAM RN @0911  04/21/21 EB    Culture (A)  Final    STAPHYLOCOCCUS HOMINIS THE SIGNIFICANCE OF ISOLATING THIS ORGANISM FROM A SINGLE SET OF  BLOOD CULTURES WHEN MULTIPLE SETS ARE DRAWN IS UNCERTAIN. PLEASE NOTIFY THE MICROBIOLOGY DEPARTMENT WITHIN ONE WEEK IF SPECIATION AND SENSITIVITIES ARE REQUIRED. Performed at [facility], [place] [address], [place],  [address]    Report Status PENDING  Incomplete  Blood Culture ID Panel (Reflexed)     Status: Abnormal  Collection Time: 04/19/21 11:20 PM  Result Value Ref Range Status   Enterococcus faecalis NOT DETECTED NOT DETECTED Final   Enterococcus Faecium NOT DETECTED NOT DETECTED Final   Listeria monocytogenes NOT DETECTED NOT DETECTED Final   Staphylococcus species DETECTED (A) NOT DETECTED Final    Comment: CRITICAL RESULT CALLED TO, READ BACK BY AND VERIFIED WITH: JERRI TEGRAM RN @0911  04/21/21 EB    Staphylococcus aureus (BCID) NOT DETECTED NOT DETECTED Final   Staphylococcus epidermidis NOT DETECTED NOT DETECTED Final   Staphylococcus lugdunensis NOT DETECTED NOT DETECTED Final   Streptococcus species NOT DETECTED NOT DETECTED Final   Streptococcus agalactiae NOT DETECTED NOT DETECTED Final   Streptococcus pneumoniae NOT DETECTED NOT DETECTED Final   Streptococcus pyogenes NOT DETECTED NOT DETECTED Final   A.calcoaceticus-baumannii NOT DETECTED NOT DETECTED Final   Bacteroides fragilis NOT DETECTED NOT DETECTED Final   Enterobacterales NOT DETECTED NOT DETECTED Final   Enterobacter cloacae complex NOT DETECTED NOT DETECTED Final   Escherichia coli NOT DETECTED NOT DETECTED Final   Klebsiella aerogenes NOT DETECTED NOT DETECTED Final   Klebsiella oxytoca NOT DETECTED NOT DETECTED Final   Klebsiella pneumoniae NOT DETECTED NOT DETECTED Final   Proteus species NOT DETECTED NOT DETECTED Final   Salmonella species NOT DETECTED NOT DETECTED Final   Serratia marcescens NOT DETECTED NOT DETECTED Final   Haemophilus influenzae NOT DETECTED NOT DETECTED Final   Neisseria meningitidis NOT DETECTED NOT DETECTED Final   Pseudomonas aeruginosa NOT DETECTED NOT DETECTED  Final   Stenotrophomonas maltophilia NOT DETECTED NOT DETECTED Final   Candida albicans NOT DETECTED NOT DETECTED Final   Candida auris NOT DETECTED NOT DETECTED Final   Candida glabrata NOT DETECTED NOT DETECTED Final   Candida krusei NOT DETECTED NOT DETECTED Final   Candida parapsilosis NOT DETECTED NOT DETECTED Final   Candida tropicalis NOT DETECTED NOT DETECTED Final   Cryptococcus neoformans/gattii NOT DETECTED NOT DETECTED Final    Comment: Performed at [facility], [address] [place], [place] [address]  Culture, blood (routine x 2)     Status: None (Preliminary result)   Collection Time: 04/21/21  1:17 PM   Specimen: BLOOD RIGHT FOREARM  Result Value Ref Range Status   Specimen Description BLOOD RIGHT FOREARM  Final   Special Requests   Final    BOTTLES DRAWN AEROBIC AND ANAEROBIC Blood Culture results may not be optimal due to an inadequate volume of blood received in culture bottles   Culture   Final    NO GROWTH < 24 HOURS Performed at [facility], [place] [address] [place], [place] [address]    Report Status PENDING  Incomplete  Culture, blood (routine x 2)     Status: None (Preliminary result)   Collection Time: 04/21/21  1:22 PM   Specimen: BLOOD  Result Value Ref Range Status   Specimen Description BLOOD SITE NOT SPECIFIED  Final   Special Requests   Final    BOTTLES DRAWN AEROBIC AND ANAEROBIC Blood Culture results may not be optimal due to an inadequate volume of blood received in culture bottles   Culture   Final    NO GROWTH < 24 HOURS Performed at [facility], [place] [address] [place], [place] [address]    Report Status PENDING  Incomplete  Resp Panel by RT-PCR (Flu A&B, Covid) Urine, Clean Catch     Status: None   Collection Time: 04/21/21  4:18 PM   Specimen: Urine, Clean Catch; Nasopharyngeal(NP)  swabs in vial transport medium  Result Value Ref Range Status   SARS Coronavirus 2 by RT PCR NEGATIVE NEGATIVE Final    Comment:  (NOTE) SARS-CoV-2 target nucleic acids are NOT DETECTED.  The SARS-CoV-2 RNA is generally detectable in upper respiratory specimens during the acute phase of infection. The lowest concentration of SARS-CoV-2 viral copies this assay can detect is 138 copies/mL. A negative result does not preclude SARS-Cov-2 infection and should not be used as the sole basis for treatment or other patient management decisions. A negative result may occur with  improper specimen collection/handling, submission of specimen other than nasopharyngeal swab, presence of viral mutation(s) within the areas targeted by this assay, and inadequate number of viral copies(<138 copies/mL). A negative result must be combined with clinical observations, patient history, and epidemiological information. The expected result is Negative.  Fact Sheet for Patients:  EntrepreneurPulse.com.au  Fact Sheet for Healthcare Providers:  IncredibleEmployment.be  This test is no t yet approved or cleared by the [place] FDA and  has been authorized for detection and/or diagnosis of SARS-CoV-2 by FDA under an Emergency Use Authorization (EUA). This EUA will remain  in effect (meaning this test can be used) for the duration of the COVID-19 declaration under Section 564(b)(1) of the Act, 21 U.S.C.section 360bbb-3(b)(1), unless the authorization is terminated  or revoked sooner.       Influenza A by PCR NEGATIVE NEGATIVE Final   Influenza B by PCR NEGATIVE NEGATIVE Final    Comment: (NOTE) The Xpert Xpress SARS-CoV-2/FLU/RSV plus assay is intended as an aid in the diagnosis of influenza from Nasopharyngeal swab specimens and should not be used as a sole basis for treatment. Nasal washings and aspirates are unacceptable for Xpert Xpress SARS-CoV-2/FLU/RSV testing.  Fact Sheet for Patients: EntrepreneurPulse.com.au  Fact Sheet for Healthcare  Providers: IncredibleEmployment.be  This test is not yet approved or cleared by the [place] FDA and has been authorized for detection and/or diagnosis of SARS-CoV-2 by FDA under an Emergency Use Authorization (EUA). This EUA will remain in effect (meaning this test can be used) for the duration of the COVID-19 declaration under Section 564(b)(1) of the Act, 21 U.S.C. section 360bbb-3(b)(1), unless the authorization is terminated or revoked.  Performed at [facility], [place] [address] [place], [place] [address]       Radiology Studies: No results found.  Scheduled Meds: . amLODipine  10 mg Oral QHS  . atorvastatin  80 mg Oral q1800  . colchicine  0.6 mg Oral BID  . DULoxetine  60 mg Oral Daily  . enoxaparin (LOVENOX) injection  115 mg Subcutaneous Q12H  . escitalopram  20 mg Oral Daily  . ezetimibe  10 mg Oral Daily  . fenofibrate  54 mg Oral Daily  . insulin aspart  0-15 Units Subcutaneous TID WC  . insulin glargine  80 Units Subcutaneous BID  . losartan  100 mg Oral Daily  . metoprolol tartrate  25 mg Oral BID  . mirtazapine  7.5 mg Oral QHS  . sodium chloride flush  3 mL Intravenous Q12H   Continuous Infusions: . DAPTOmycin (CUBICIN)  IV       LOS: 1 day   Time spent: 35 minutes.  Patrecia Pour, MD [facility] [email or web address] 04/22/2021, 12:34 PM

## 2021-04-23 ENCOUNTER — Inpatient Hospital Stay (HOSPITAL_COMMUNITY): Payer: Medicare PPO | Admitting: Anesthesiology

## 2021-04-23 ENCOUNTER — Inpatient Hospital Stay (HOSPITAL_COMMUNITY): Payer: Medicare PPO

## 2021-04-23 ENCOUNTER — Encounter (HOSPITAL_COMMUNITY)
Admission: EM | Disposition: A | Discharge status: Home / Self Care | Payer: Self-pay | Source: Home / Self Care | Attending: Family Medicine

## 2021-04-23 DIAGNOSIS — Z8673 Personal history of transient ischemic attack (TIA), and cerebral infarction without residual deficits: Secondary | ICD-10-CM | POA: Diagnosis not present

## 2021-04-23 DIAGNOSIS — E669 Obesity, unspecified: Secondary | ICD-10-CM | POA: Diagnosis not present

## 2021-04-23 DIAGNOSIS — Z86711 Personal history of pulmonary embolism: Secondary | ICD-10-CM | POA: Diagnosis not present

## 2021-04-23 DIAGNOSIS — N1831 Chronic kidney disease, stage 3a: Secondary | ICD-10-CM

## 2021-04-23 DIAGNOSIS — E1169 Type 2 diabetes mellitus with other specified complication: Secondary | ICD-10-CM | POA: Diagnosis not present

## 2021-04-23 DIAGNOSIS — R7881 Bacteremia: Secondary | ICD-10-CM

## 2021-04-23 DIAGNOSIS — G250 Essential tremor: Secondary | ICD-10-CM | POA: Diagnosis not present

## 2021-04-23 HISTORY — PX: TEE WITHOUT CARDIOVERSION: SHX5443

## 2021-04-23 LAB — CULTURE, BLOOD (ROUTINE X 2): Special Requests: ADEQUATE

## 2021-04-23 LAB — CBC
HCT: 36.9 % — ABNORMAL LOW (ref 39.0–52.0)
Hemoglobin: 12.5 g/dL — ABNORMAL LOW (ref 13.0–17.0)
MCH: 30 pg (ref 26.0–34.0)
MCHC: 33.9 g/dL (ref 30.0–36.0)
MCV: 88.5 fL (ref 80.0–100.0)
Platelets: 201 10*3/uL (ref 150–400)
RBC: 4.17 MIL/uL — ABNORMAL LOW (ref 4.22–5.81)
RDW: 13 % (ref 11.5–15.5)
WBC: 7.8 10*3/uL (ref 4.0–10.5)
nRBC: 0 % (ref 0.0–0.2)

## 2021-04-23 LAB — BASIC METABOLIC PANEL
Anion gap: 6 (ref 5–15)
BUN: 26 mg/dL — ABNORMAL HIGH (ref 8–23)
CO2: 27 mmol/L (ref 22–32)
Calcium: 8.9 mg/dL (ref 8.9–10.3)
Chloride: 107 mmol/L (ref 98–111)
Creatinine, Ser: 1.74 mg/dL — ABNORMAL HIGH (ref 0.61–1.24)
GFR, Estimated: 41 mL/min — ABNORMAL LOW (ref >60)
Glucose, Bld: 93 mg/dL (ref 70–99)
Potassium: 3.8 mmol/L (ref 3.5–5.1)
Sodium: 140 mmol/L (ref 135–145)

## 2021-04-23 LAB — GLUCOSE, CAPILLARY
Glucose-Capillary: 89 mg/dL (ref 70–99)
Glucose-Capillary: 105 mg/dL — ABNORMAL HIGH (ref 70–99)
Glucose-Capillary: 68 mg/dL — ABNORMAL LOW (ref 70–99)
Glucose-Capillary: 84 mg/dL (ref 70–99)
Glucose-Capillary: 149 mg/dL — ABNORMAL HIGH (ref 70–99)
Glucose-Capillary: 259 mg/dL — ABNORMAL HIGH (ref 70–99)

## 2021-04-23 LAB — LIPID PANEL
Cholesterol: 110 mg/dL (ref 0–200)
HDL: 23 mg/dL — ABNORMAL LOW (ref >40)
LDL Cholesterol: 53 mg/dL (ref 0–99)
Total CHOL/HDL Ratio: 4.8 RATIO
Triglycerides: 172 mg/dL — ABNORMAL HIGH (ref <150)
VLDL: 34 mg/dL (ref 0–40)

## 2021-04-23 LAB — HEMOGLOBIN A1C
Hgb A1c MFr Bld: 7.9 % — ABNORMAL HIGH (ref 4.8–5.6)
Mean Plasma Glucose: 180 mg/dL

## 2021-04-23 LAB — URINE CULTURE

## 2021-04-23 SURGERY — ECHOCARDIOGRAM, TRANSESOPHAGEAL
Anesthesia: Monitor Anesthesia Care

## 2021-04-23 MED ORDER — PROPOFOL 500 MG/50ML IV EMUL
INTRAVENOUS | Status: DC | PRN
  Administered 2021-04-23: 100 ug/kg/min via INTRAVENOUS

## 2021-04-23 MED ORDER — APIXABAN 5 MG PO TABS
5.0000 mg | ORAL_TABLET | Freq: Two times a day (BID) | ORAL | Status: DC
  Administered 2021-04-23 – 2021-04-24 (×2): 5 mg via ORAL
  Filled 2021-04-23 (×2): qty 1

## 2021-04-23 MED ORDER — SODIUM CHLORIDE 0.9 % IV SOLN: INTRAVENOUS | Status: DC

## 2021-04-23 MED ORDER — PHENYLEPHRINE 40 MCG/ML (10ML) SYRINGE FOR IV PUSH (FOR BLOOD PRESSURE SUPPORT)
PREFILLED_SYRINGE | INTRAVENOUS | Status: DC | PRN
  Administered 2021-04-23 (×2): 80 ug via INTRAVENOUS

## 2021-04-23 MED ORDER — DEXTROSE 50 % IV SOLN
INTRAVENOUS | Status: AC
  Filled 2021-04-23: qty 50

## 2021-04-23 MED ORDER — SODIUM CHLORIDE 0.9 % IV SOLN: INTRAVENOUS | Status: DC | PRN

## 2021-04-23 MED ORDER — DEXTROSE 50 % IV SOLN
12.5000 g | INTRAVENOUS | Status: AC
  Administered 2021-04-23: 12.5 g via INTRAVENOUS

## 2021-04-23 NOTE — Progress Notes (Signed)
    [facility] has been requested to perform a transesophageal echocardiogram on Joshua [name] for bacteremia.  After careful review of history and examination, the risks and benefits of transesophageal echocardiogram have been explained including risks of esophageal damage, perforation (1:10,000 risk), bleeding, pharyngeal hematoma as well as other potential complications associated with conscious sedation including aspiration, arrhythmia, respiratory failure and death. Alternatives to treatment were discussed, questions were answered. Patient is willing to proceed.   Cecilie Kicks, NP  04/23/2021 8:35 AM

## 2021-04-23 NOTE — Anesthesia Preprocedure Evaluation (Addendum)
Anesthesia Evaluation  Patient identified by MRN, date of birth, ID band Patient awake    Reviewed: Allergy & Precautions, NPO status , Patient's Chart, lab work & pertinent test results  History of Anesthesia Complications Negative for: history of anesthetic complications  Airway Mallampati: II  TM Distance: >3 FB Neck ROM: Full    Dental  (+) Dental Advisory Given   Pulmonary sleep apnea and Continuous Positive Airway Pressure Ventilation , former smoker, PE (2018)   Pulmonary exam normal        Cardiovascular hypertension, Pt. on medications + Peripheral Vascular Disease  Normal cardiovascular exam  TTE 03/08/21: suggestive of cardiac amyloidosis, EF 55-60%, moderate LVH, grade I DD, moderate dilatation of the ascending aorta measuring 47m   Neuro/Psych Depression CVA    GI/Hepatic Neg liver ROS, GERD  ,  Endo/Other  diabetes, Type 2  Renal/GU Renal InsufficiencyRenal disease  negative genitourinary   Musculoskeletal  (+) Arthritis ,   Abdominal   Peds  Hematology  (+) anemia ,   Anesthesia Other Findings Day of surgery medications reviewed with patient.  Reproductive/Obstetrics negative OB ROS                            Anesthesia Physical Anesthesia Plan  ASA: III  Anesthesia Plan: MAC   Post-op Pain Management:    Induction:   PONV Risk Score and Plan: Treatment may vary due to age or medical condition and Propofol infusion  Airway Management Planned: Natural Airway and Nasal Cannula  Additional Equipment: None  Intra-op Plan:   Post-operative Plan:   Informed Consent: I have reviewed the patients History and Physical, chart, labs and discussed the procedure including the risks, benefits and alternatives for the proposed anesthesia with the patient or authorized representative who has indicated his/her understanding and acceptance.       Plan Discussed with:  CRNA  Anesthesia Plan Comments:        Anesthesia Quick Evaluation

## 2021-04-23 NOTE — CV Procedure (Signed)
    Transesophageal Echocardiogram Note  VIHAN SANTAGATA [id number] 12/31/1950  Procedure: Transesophageal Echocardiogram Indications: bacteremia   Procedure Details Consent: Obtained Time Out: Verified patient identification, verified procedure, site/side was marked, verified correct patient position, special equipment/implants available, Radiology Safety Procedures followed,  medications/allergies/relevent history reviewed, required imaging and test results available.  Performed  Medications:  During this procedure the patient is administered a propofol drip ( total of 203 mg IV ) for  sedation.  The patient's heart rate, blood pressure, and oxygen saturation are monitored continuously during the procedure. The period of conscious sedation is  30  minutes, of which I was present face-to-face 100% of this time.  Left Ventrical:  Normal LV function   Mitral Valve:  Normal ,  No vegetation   Aortic Valve: 3 leaflet valve, no AI, no vegetation   Tricuspid Valve: mild TR, no vegetation   Pulmonic Valve: trace PI,   Left Atrium/ Left atrial appendage: no thrombi   Atrial septum: no ASD or PFO   Aorta: normal    Complications: No apparent complications Patient did tolerate procedure well.   Thayer Headings, Brooke Bonito., MD, [facility] 04/23/2021, 1:52 PM

## 2021-04-23 NOTE — Evaluation (Signed)
Physical Therapy Evaluation Patient Details Name: Joshua [name] MRN: [id number] DOB: 1951/05/07 Today's Date: 04/23/2021   History of Present Illness  Joshua [name] is a 70 y.o. male presenting to ED on 5/29 with persistent slurred speech and R-sided weakness. Patient taken to [facility] in [place] (5/25)where he was visiting his son at time of symptom onset with likely CVA. Blood cultures came back positive on 04/18/21. Patient admitted with acute staphylococcal bacteremia. MRI pending secondary to deep brain stimulator. PMHx significant for hypertrophic cardiomyopathy, HTN, DMII, CHF, OSA on CPAP, CKD stage IIIa, PE after knee surgery, essential tremor status post deep brain stimulator 2020, Hx of CVA.  Clinical Impression  Patient presents with right sided weakness, impaired coordination, right foot drop, slurred speech, impaired balance and impaired mobility s/p above. Pt lives at home with spouse and reports being independent for ADLs and IADLs PTA. Reports hx of falls and has learned that he needs to slow down. Today, pt tolerated transfers and gait training with Min guard-Min A due to balance deficits (reports hx of vertigo) and decreased right foot clearance. Pt's biggest complaint is decreased fine motor abilities in right hand. Gave pt functional tasks to practice fine motor skills with right hand while in the room (opening/closing tops/bottles etc). Pt is not at functional baseline. Will follow acutely to maximize independence and mobility prior to return home.    Follow Up Recommendations Outpatient PT;Supervision for mobility/OOB (neuro OPPT)    Equipment Recommendations  None recommended by PT    Recommendations for Other Services       Precautions / Restrictions Precautions Precautions: Fall Restrictions Weight Bearing Restrictions: No      Mobility  Bed Mobility               General bed mobility comments: Seated in recliner upon entry.     Transfers Overall transfer level: Needs assistance Equipment used: None Transfers: Sit to/from Stand Sit to Stand: Min assist         General transfer comment: Min A to steady in standing due to reported vertigo when standing up too fast. Stood from recliner x1 and reaching for table for support immediately.  Ambulation/Gait Ambulation/Gait assistance: Min guard Gait Distance (Feet): 400 Feet Assistive device: None Gait Pattern/deviations: Step-through pattern;Decreased stride length;Decreased dorsiflexion - right Gait velocity: decreased   General Gait Details: Slow, unsteady gait with decreased foot clearance on right, slight knee hyperextension thrust on right during stance phase. 2 standing rest breaks.  Stairs            Wheelchair Mobility    Modified Rankin (Stroke Patients Only) Modified Rankin (Stroke Patients Only) Pre-Morbid Rankin Score: Slight disability Modified Rankin: Moderately severe disability     Balance Overall balance assessment: Needs assistance Sitting-balance support: No upper extremity supported;Feet supported Sitting balance-Leahy Scale: Good     Standing balance support: During functional activity Standing balance-Leahy Scale: Fair Standing balance comment: Reports increased unsteadiness compared to baseline and hx of vertigo resulting in need to have at least 1 UE support initially.                             Pertinent Vitals/Pain Pain Assessment: No/denies pain    Home Living Family/patient expects to be discharged to:: Private residence Living Arrangements: Spouse/significant other Available Help at Discharge: Family;Available 24 hours/day Type of Home: House Home Access: Stairs to enter Entrance Stairs-Rails: Right;Left Entrance Stairs-Number of Steps: 5  small steps on to [place]: One level Home Equipment: [name] - 2 wheels;Cane - single point;Shower seat - built in;Hand held shower head;Grab bars -  tub/shower Additional Comments: Home is handicapped accessible    Prior Function Level of Independence: Independent         Comments: I with ADLs/IADLs without AD     Hand Dominance   Dominant Hand: Right    Extremity/Trunk Assessment   Upper Extremity Assessment Upper Extremity Assessment: Defer to OT evaluation RUE Deficits / Details: AROM WFL. MMT grossly 4-/5 at shoulder, elbow and wrist. Gross grasp 3+/5. Tremors RUE/LUE    Lower Extremity Assessment Lower Extremity Assessment: RLE deficits/detail;Generalized weakness RLE Deficits / Details: foot drop, 1/5 DF. RLE Sensation: WNL    Cervical / Trunk Assessment Cervical / Trunk Assessment: Normal  Communication   Communication: Expressive difficulties (slurred speech)  Cognition Arousal/Alertness: Awake/alert Behavior During Therapy: WFL for tasks assessed/performed Overall Cognitive Status: Impaired/Different from baseline Area of Impairment: Safety/judgement                         Safety/Judgement: Decreased awareness of safety;Decreased awareness of deficits            General Comments General comments (skin integrity, edema, etc.): Gave pt functional tasks to practice fine motor skills with right hand while in the room (opening/closing tops/bottles etc).    Exercises     Assessment/Plan    PT Assessment Patient needs continued PT services  PT Problem List Decreased strength;Decreased balance;Decreased safety awareness;Decreased coordination       PT Treatment Interventions Therapeutic exercise;Gait training;DME instruction;Stair training;Balance training;Neuromuscular re-education;Patient/family education;Therapeutic activities;Functional mobility training    PT Goals (Current goals can be found in the Care Plan section)  Acute Rehab PT Goals Patient Stated Goal: To return home. PT Goal Formulation: With patient Time For Goal Achievement: 05/07/21 Potential to Achieve Goals: Good     Frequency Min 4X/week   Barriers to discharge        Co-evaluation               AM-PAC PT "6 Clicks" Mobility  Outcome Measure Help needed turning from your back to your side while in a flat bed without using bedrails?: None Help needed moving from lying on your back to sitting on the side of a flat bed without using bedrails?: None Help needed moving to and from a bed to a chair (including a wheelchair)?: A Little Help needed standing up from a chair using your arms (e.g., wheelchair or bedside chair)?: A Little Help needed to walk in hospital room?: A Little Help needed climbing 3-5 steps with a railing? : A Lot 6 Click Score: 19    End of Session Equipment Utilized During Treatment: Gait belt Activity Tolerance: Patient tolerated treatment well Patient left: in chair;with call bell/phone within reach Nurse Communication: Mobility status PT Visit Diagnosis: Hemiplegia and hemiparesis;Unsteadiness on feet (R26.81) Hemiplegia - Right/Left: Right Hemiplegia - dominant/non-dominant: Dominant    Time: 8676-1950 PT Time Calculation (min) (ACUTE ONLY): 18 min   Charges:   PT Evaluation $PT Eval Moderate Complexity: 1 Mod          Marisa Severin, PT, DPT Acute Rehabilitation Services Pager [phone] Office [place] 04/23/2021, 1:00 PM

## 2021-04-23 NOTE — Anesthesia Postprocedure Evaluation (Signed)
Anesthesia Post Note  Patient: Joshua [name]  Procedure(s) Performed: TRANSESOPHAGEAL ECHOCARDIOGRAM (TEE) (N/A )     Patient location during evaluation: PACU Anesthesia Type: MAC Level of consciousness: awake and alert and oriented Pain management: pain level controlled Vital Signs Assessment: post-procedure vital signs reviewed and stable Respiratory status: spontaneous breathing, nonlabored ventilation and respiratory function stable Cardiovascular status: blood pressure returned to baseline Postop Assessment: no apparent nausea or vomiting Anesthetic complications: no   No complications documented.  Last Vitals:  Vitals:   04/23/21 1217 04/23/21 1400  BP: (!) 142/86 112/65  Pulse:  60  Resp: 11 13  Temp:  36.5 C  SpO2: 98% 99%    Last Pain:  Vitals:   04/23/21 1400  TempSrc: Temporal  PainSc: [place]

## 2021-04-23 NOTE — Transfer of Care (Signed)
Immediate Anesthesia Transfer of Care Note  Patient: Joshua [name]  Procedure(s) Performed: TRANSESOPHAGEAL ECHOCARDIOGRAM (TEE) (N/A )  Patient Location: Endoscopy Unit  Anesthesia Type:MAC  Level of Consciousness: awake, alert  and oriented  Airway & Oxygen Therapy: Patient Spontanous Breathing and Patient connected to nasal cannula oxygen  Post-op Assessment: Report given to RN, Post -op Vital signs reviewed and stable and Patient moving all extremities  Post vital signs: Reviewed and stable  Last Vitals:  Vitals Value Taken Time  BP 112/65 04/23/21 1357  Temp    Pulse 63 04/23/21 1402  Resp 15 04/23/21 1402  SpO2 100 % 04/23/21 1402  Vitals shown include unvalidated device data.  Last Pain:  Vitals:   04/23/21 1217  TempSrc:   PainSc: 0-No pain         Complications: No complications documented.

## 2021-04-23 NOTE — Progress Notes (Signed)
OT Cancellation Note  Patient Details Name: Joshua [name] MRN: [id number] DOB: 1951-10-22   Cancelled Treatment:    Reason Eval/Treat Not Completed: Patient at procedure or test/ unavailable  Leaving floor for TEE on arrival to room  Haruko Mersch OTR/L acute rehab services Office: [place] 04/23/2021, 11:57 AM

## 2021-04-23 NOTE — Progress Notes (Addendum)
STROKE TEAM PROGRESS NOTE   INTERVAL HISTORY  Mr. Spellman developed slurred speech and abnormal sensation of right hand while visiting his family in [place] on 5/25.  He was initially evaluated at a local hospital.   CT head showed no acute findings. MRI not performed due to remote for deep brain stimulator was back at home in [place]. When he returned home, he was called for 1 of 2 collections of blood cultures that were positive.  He was directed to local ED on 5/27. When he presented to [facility] ED he had no signs of infection.  He continued to have decrease sensation in his right hand symptoms and slurred speech. CT head was without acute findings. Blood cultures were repeated and he was discharged.  He was found to have Staph spp. growing in the most recent blood cultures, prompting return to ED at [facility].  Vitals:   04/23/21 1217 04/23/21 1400 04/23/21 1407 04/23/21 1442  BP: (!) 142/86 112/65 112/83 129/80  Pulse:  60 64 63  Resp: 11 13 13 14   Temp:  97.7 F (36.5 C)  97.8 F (36.6 C)  TempSrc:  Temporal  Oral  SpO2: 98% 99% 98% 100%  Weight:      Height:       CBC:  Recent Labs  Lab 04/19/21 2112 04/21/21 1233 04/22/21 0054 04/23/21 0717  WBC 9.3   < > 9.0 7.8  NEUTROABS 3.8  --   --   --   HGB 13.3   < > 12.5* 12.5*  HCT 40.0   < > 37.9* 36.9*  MCV 89.7   < > 90.7 88.5  PLT 231   < > 206 201   < > = values in this interval not displayed.   Basic Metabolic Panel:  Recent Labs  Lab 04/22/21 0054 04/23/21 0717  NA 140 140  K 3.8 3.8  CL 105 107  CO2 28 27  GLUCOSE 147* 93  BUN 22 26*  CREATININE 1.83* 1.74*  CALCIUM 9.0 8.9   Lipid Panel:  Recent Labs  Lab 04/23/21 0717  CHOL 110  TRIG 172*  HDL 23*  CHOLHDL 4.8  VLDL 34  LDLCALC 53   HgbA1c:  Recent Labs  Lab 04/21/21 1617  HGBA1C 7.9*   Urine Drug Screen: No results for input(s): LABOPIA, COCAINSCRNUR, LABBENZ, AMPHETMU, THCU, LABBARB in the last 168 hours.  Alcohol Level No  results for input(s): ETH in the last 168 hours.  IMAGING past 24 hours  CT HEAD  Result Date: 04/22/2021 IMPRESSION: CT HEAD IMPRESSION:  1. Stable head CT. No acute intracranial abnormality.  2. Atrophy with chronic small vessel ischemic disease with superimposed chronic right basal ganglia lacunar infarct.  3. Stable left DBS electrode.   CT ANGIO NECK W OR WO CONTRAST Result Date: 04/22/2021  IMPRESSION:  1. 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  2. Mild atheromatous change about the carotid bifurcations and carotid siphons without significant stenosis. No hemodynamically significant or correctable stenosis about the major arterial vasculature of the head and neck.  3. Distal small vessel atheromatous irregularity within the intracranial circulation, with moderate to severe bilateral P3 stenoses, right worse than left.  4. 4 mm right upper lobe pulmonary nodule, indeterminate. No follow-up needed if patient is low-risk. Non-contrast chest CT can be considered in 12 months if patient is high-risk.  Echo Result Date 03/12/2021 IMPRESSIONS  1. Although overall left ventricular GLS is normal, the distribution of  strain abnormalities is strongly suggestive of cardiac amyloidosis  ("cherry on top" pattern). Left ventricular ejection fraction, by  estimation, is 55 to 60%. The left ventricle has  normal function. The left ventricle has no regional wall motion  abnormalities. There is moderate concentric left ventricular hypertrophy.  Left ventricular diastolic parameters are consistent with Grade I  diastolic dysfunction (impaired relaxation). The  average left ventricular global longitudinal strain is -19.1 %. The global  longitudinal strain is normal.  2. Right ventricular systolic function is normal. The right ventricular  size is normal.  3. The  mitral valve is normal in structure. No evidence of mitral valve  regurgitation.  4. The aortic valve is tricuspid. Aortic valve regurgitation is not  visualized. Mild to moderate aortic valve sclerosis/calcification is  present, without any evidence of aortic stenosis.  5. Aortic dilatation noted. There is moderate dilatation of the ascending  aorta, measuring 45 mm.   PHYSICAL EXAM GENERAL: Awake, alert, in no acute distress Head: Normocephalic and atraumatic, dry mucous membranes EENT: Wears glasses at baseline, normal conjunctivae, no OP obstruction LUNGS: Normal respiratory effort, non-labored breathing, in no respiratory distress CV: Regular rate on telemetry, lower extremities without edema ABDOMEN: Soft, non-tender Ext: warm, without obvious deformity  NEURO:  Mental Status: Awake, alert, and oriented to person, place, time, age, month, year, location, and situation. Patient is able to provide a clear and coherent history of present illness.  Speech/Language: speech is dysarthric and slowed.  Patient is not aphasic.   Naming, repetition, and comprehension intact, though he is dysfluent. No neglect is noted on examination Cranial Nerves:  II: PERRL 3 mm/brisk. Visual fields full.  III, IV, VI: EOMI without ptosis.  V: Sensation is intact to light touch and symmetrical to face.  VII: Face is symmetric resting and smiling.   VIII: Hearing is intact to voice IX, X: Palate elevation is symmetric. Phonation normal.  XI: Normal sternocleidomastoid and trapezius muscle strength XII: Tongue protrudes midline without fasciculations  Motor: 5/5 strength is all muscle groups without pronator drift on assessment, right hand: Finger flexion 4-/5, finger extension 4+/5, finger abduction 4+/5, unable to maintain circle with thumb and index finger against resistance (4/5)  Tremor in bilateral upper extremities, left > right.  Tone and bulk are normal.  Sensation: Intact to light touch  bilaterally in all four extremities.  Coordination: FTN intact on left upper extremity, right upper extremity with some loss of coordinated movements. HKS intact bilaterally. No pronator drift.  Gait: deferred  ASSESSMENT/PLAN Mr. ADE STMARIE is a 70 year old male with history of essential tremor s/p DBS, DM, CKD, PE/DVT, CVA who presented to the ED for evaluation of bacteremia on blood culture results from an OSH on 5/26.  The patient initially presented to an ED in [place] with complaints of slurred speech, gait disturbance, and right-sided weakness. CTH negative and have not yet been able to obtain an MRI due to DBS for essential tremors.    Possible Stroke:  suspected left brain stroke   CT head: no acute abnormality.   CTA head & neck: proximal left subclavian artery chronic adherent thrombus. moderate to severe bilateral P3 stenoses, right worse than left  MRI: pending will need [facility] to temporarily turn off DBS. I have contacted Sherren Mocha this am.  2D Echo (4/19) EF: 55-60%, left ventricular strain suggestive of cardiac amyloidosis, mild LVH, Grade I diastolic dysfunction  TEE without  vegetation  LDL 99  HgbA1c 7.9  VTE prophylaxis - lovenox    There are no active orders of the following types: Diet, Nourishments.    Eliquis (apixaban) daily prior to admission, now on Eliquis   Therapy recommendations:  Outpt SLP, no OT needs  Disposition:  pending  Prior Stroke  Hx stroke: October 2020 presented with severe dysarthria and slowing.  left hand and leg ataxia and left beating nystagmus on leftward gaze as well as rotary nystagmus on upward gaze.  All suggestive of a posterior fossa/cerebellar stroke which led to diagnosis of MRI negative posterior circulation stroke in addition to the acute right basal ganglia stroke revealed on imaging at the time.  Source of the posterior circulation stroke was felt to be a thrombus in the left subclavian, for which he was  started on Eliquis.  Due to persistence of this thrombus on last imaging (December 21, 2019), he was continued on Eliquis.  This was stopped due to a large pericardial effusion seen on echocardiogram on 01/2019.  He was started on colchicine for suspected pericarditis with resolution of the effusion on echocardiogram 03/12/2021, with resumption of his Eliquis on 03/26/2021 at outpatient cardiology follow-up.   Concern for bacteremia  Blood cx from Hospital in [place] showed staphylococcal species in 1 site  Blood cx (5/29) no growth x 2 days  Blood cx (5/27): staphlococcus hominis in 1 site and no growth from the other  TEE was negative for endocarditis   Daptomycin was started for MRSA coverage  Urine culture (5/29): multiple species present, need to recollect  ID consulted and felt blood cultures were contaminant. Recommended discontinuing antimicrobials.  Hypertension  Home meds:  Amlodipine 10mg , losartan 100mg  daily, lopressor 25mg  bid  Stable . Permissive hypertension (OK if < 220/120) but gradually normalize in 5-7 days . Long-term BP goal normotensive  Hyperlipidemia  Home meds:  Atorvastatin 80, Zetia 10, fenofibrate 54mg  resumed in hospital  LDL 99, goal < 70  High intensity statin   Continue statin at discharge  Diabetes type II Controlled  Home meds:  glargine 80mg  bid  HgbA1c 7.9, goal < 7.0  CBGs Recent Labs    04/23/21 1322 04/23/21 1402 04/23/21 1609  GLUCAP 105* 84 149*      SSI  Chronic Kidney Disease, Stage IIIa   Creatinine 1.83->1.74  Avoid nephrotoxins  Other Stroke Risk Factors  Advanced Age >/= 2   Former smoker, quit 37 years ago.  He has a 10 pack-year smoking history  ETOH use, advised to drink no more than 1-2 drink(s) per week  Obesity, Body mass index is 31.08 kg/m., BMI >/= 30 associated with increased stroke risk, recommend weight loss, diet and exercise as appropriate   Hx stroke: October 2020   Coronary artery  disease: hypertrophic cardiomyopathy, NSVT, PVCs  Obstructive sleep apnea, on CPAP at home  Other Active Problems  Depression: Takes lexapro 20  DM  DVT/Pulmonary emboli  GERD  Gout: takes colchicine   Essential tremors: has DBS  4 mm right upper lobe pulmonary nodule incidental finding. Will need follow-up with PCP  Hospital day # 2  Lissy Olivencia-Simmons, ACNP-BC Stroke NP  ATTENDING NOTE: I reviewed above note and agree with the assessment and plan. Pt was seen and examined.   70 year old male with history of diabetes, hypertension, hyperlipidemia, OSA on CPAP, cardiomyopathy, chronic left subclavian thrombus on Eliquis, ET status post DBS admitted for slurred speech, unsteady gait, right hand clumsiness.  Patient had right frontal  white matter small stroke in 03/2011.  He also had stroke 08/2019 with right BG/IC and DWI-negative posterior circulation stroke.  At that time CTA head and neck showed left subclavian thrombus, EF 55 to 60%, LDL 107 A1c 7.8.  Patient discharged with Eliquis and Lipitor 80.  In 11/2019 CTA repeat still showing left subclavian artery thrombus, Eliquis continued.  Had pericardial effusion in 01/2021, Eliquis discontinued at that time.  Follow-up with cardiology in 03/2021, cardioversion resolved, Eliquis restarted.  Recently patient had travel outside the state had acute onset slurred speech, unsteady gait and right hand weakness, CT negative at the time however MRI not able to perform due to DBS.  However, blood culture at outside hospital positive.  He was sent to ER for evaluation.  CT no acute abnormality.  CTA head and neck showed continued left subclavian artery chronic thrombus.  Bilateral P3 stenosis, right more than left.  MRI pending.  EF 55 to 60% with evidence of cardiac amyloidosis.  TEE no endocarditis.  A1c 7.9, LDL 53.  Blood culture 5/27 with 1 set growing of staph hominis, concerning for contamination.  Repeat blood culture pending.  On  exam, patient awake alert, wife at bedside.  No aphasia, follows simple commands, however mild to moderate dysarthria.  Visual fields full, no gaze palsy, right nasolabial fold mildly flattening.  Right upper extremity pronator drift, right hand finger grip 4/5.  Bilateral lower extremity equal strengths.  Finger-to-nose ataxic on the right, grossly intact on the left.  Bilateral essential tremor mild.  Currently Eliquis was on hold pending MRI result.  Continue Lipitor 80, Zetia 10, fenofibrate 54 home medication.  Still on daptomycin.  Will follow.  For detailed assessment and plan, please refer to above as I have made changes wherever appropriate.   [name], MD PhD Stroke Neurology 04/23/2021 7:19 PM     To contact Stroke Continuity provider, please refer to [email or web address] After hours, contact General Neurology

## 2021-04-23 NOTE — Progress Notes (Addendum)
Subjective: No new complaints   Antibiotics:  Anti-infectives (From admission, onward)   Start     Dose/Rate Route Frequency Ordered Stop   04/22/21 1200  DAPTOmycin (CUBICIN) 950 mg in sodium chloride 0.9 % IVPB        8 mg/kg  115.8 kg 138 mL/hr over 30 Minutes Intravenous Daily 04/22/21 1005     04/21/21 1500  ceFAZolin (ANCEF) IVPB 2g/100 mL premix  Status:  Discontinued        2 g 200 mL/hr over 30 Minutes Intravenous Every 8 hours 04/21/21 1350 04/22/21 1005      Medications: Scheduled Meds: . amLODipine  10 mg Oral QHS  . apixaban  5 mg Oral BID  . atorvastatin  80 mg Oral q1800  . colchicine  0.6 mg Oral BID  . DULoxetine  60 mg Oral Daily  . escitalopram  20 mg Oral Daily  . ezetimibe  10 mg Oral Daily  . fenofibrate  54 mg Oral Daily  . insulin aspart  0-15 Units Subcutaneous TID WC  . insulin glargine  80 Units Subcutaneous BID  . losartan  100 mg Oral Daily  . metoprolol tartrate  25 mg Oral BID  . mirtazapine  7.5 mg Oral QHS  . sodium chloride flush  3 mL Intravenous Q12H   Continuous Infusions: . DAPTOmycin (CUBICIN)  IV 950 mg (04/22/21 1254)   PRN Meds:.acetaminophen **OR** acetaminophen, albuterol, artificial tears, cycloSPORINE, ondansetron **OR** ondansetron (ZOFRAN) IV    Objective: Weight change:   Intake/Output Summary (Last 24 hours) at 04/23/2021 1522 Last data filed at 04/23/2021 0400 Gross per 24 hour  Intake 309 ml  Output 500 ml  Net -191 ml   Blood pressure 129/80, pulse 63, temperature 97.8 F (36.6 C), temperature source Oral, resp. rate 14, height 6\' 4"  (1.93 m), weight 115.8 kg, SpO2 100 %. Temp:  [97.7 F (36.5 C)-99 F (37.2 C)] 97.8 F (36.6 C) (05/31 1442) Pulse Rate:  [60-83] 63 (05/31 1442) Resp:  [11-19] 14 (05/31 1442) BP: (112-148)/(65-87) 129/80 (05/31 1442) SpO2:  [94 %-100 %] 100 % (05/31 1442)  Physical Exam: Physical Exam Constitutional:      Appearance: He is well-developed.  HENT:      Head: Normocephalic and atraumatic.  Eyes:     Conjunctiva/sclera: Conjunctivae normal.  Cardiovascular:     Rate and Rhythm: Normal rate and regular rhythm.     Heart sounds: No murmur heard. No friction rub.  Pulmonary:     Effort: Pulmonary effort is normal. No respiratory distress.     Breath sounds: Normal breath sounds. No stridor. No wheezing or rhonchi.  Abdominal:     General: There is no distension.     Palpations: Abdomen is soft. There is no mass.  Musculoskeletal:        General: Normal range of motion.     Cervical back: Normal range of motion and neck supple.  Skin:    General: Skin is warm and dry.     Findings: No erythema or rash.  Neurological:     Mental Status: He is alert and oriented to person, place, and time.     Comments: He still has some slurred speech and diminished right hand grip  Psychiatric:        Mood and Affect: Mood normal.        Speech: Speech is delayed.        Behavior: Behavior normal.  Thought Content: Thought content normal.        Judgment: Judgment normal.      CBC:    BMET Recent Labs    04/22/21 0054 04/23/21 0717  NA 140 140  K 3.8 3.8  CL 105 107  CO2 28 27  GLUCOSE 147* 93  BUN 22 26*  CREATININE 1.83* 1.74*  CALCIUM 9.0 8.9     Liver Panel  Recent Labs    04/22/21 0054  PROT 6.1*  ALBUMIN 3.3*  AST 21  ALT 29  ALKPHOS 77  BILITOT 0.6       Sedimentation Rate No results for input(s): ESRSEDRATE in the last 72 hours. C-Reactive Protein No results for input(s): CRP in the last 72 hours.  Micro Results: Recent Results (from the past 720 hour(s))  Culture, blood (Routine X 2) w Reflex to ID Panel     Status: None (Preliminary result)   Collection Time: 04/19/21  8:50 PM   Specimen: BLOOD  Result Value Ref Range Status   Specimen Description   Final    BLOOD BLOOD LEFT FOREARM Performed at [facility], [address], [place], [place] [address]    Special Requests    Final    BOTTLES DRAWN AEROBIC AND ANAEROBIC Blood Culture adequate volume Performed at [facility], [address], [place], [place] [address]    Culture   Final    NO GROWTH 3 DAYS Performed at [facility], [place] [address], [place], [place] [address]    Report Status PENDING  Incomplete  Culture, blood (Routine X 2) w Reflex to ID Panel     Status: Abnormal   Collection Time: 04/19/21 11:20 PM   Specimen: BLOOD  Result Value Ref Range Status   Specimen Description   Final    BLOOD RIGHT HAND Performed at [facility], [address], [place], [place] [address]    Special Requests   Final    BOTTLES DRAWN AEROBIC AND ANAEROBIC Blood Culture adequate volume Performed at [facility], [address] [place], [place] [address]    Culture  Setup Time   Final    GRAM POSITIVE COCCI IN CLUSTERS AEROBIC BOTTLE ONLY CRITICAL RESULT CALLED TO, READ BACK BY AND VERIFIED WITH: Camie Patience RN @0911  04/21/21 EB    Culture (A)  Final    STAPHYLOCOCCUS HOMINIS THE SIGNIFICANCE OF ISOLATING THIS ORGANISM FROM A SINGLE SET OF BLOOD CULTURES WHEN MULTIPLE SETS ARE DRAWN IS UNCERTAIN. PLEASE NOTIFY THE MICROBIOLOGY DEPARTMENT WITHIN ONE WEEK IF SPECIATION AND SENSITIVITIES ARE REQUIRED. Performed at [facility], [place] [address], [place], [place] [address]    Report Status 04/23/2021 FINAL  Final  Blood Culture ID Panel (Reflexed)     Status: Abnormal   Collection Time: 04/19/21 11:20 PM  Result Value Ref Range Status   Enterococcus faecalis NOT DETECTED NOT DETECTED Final   Enterococcus Faecium NOT DETECTED NOT DETECTED Final   Listeria monocytogenes NOT DETECTED NOT DETECTED Final   Staphylococcus species DETECTED (A) NOT DETECTED Final    Comment: CRITICAL RESULT CALLED TO, READ BACK BY AND VERIFIED WITH: JERRI TEGRAM RN @0911  04/21/21 EB    Staphylococcus aureus (BCID) NOT DETECTED NOT DETECTED Final    Staphylococcus epidermidis NOT DETECTED NOT DETECTED Final   Staphylococcus lugdunensis NOT DETECTED NOT DETECTED Final   Streptococcus species NOT DETECTED NOT DETECTED Final   Streptococcus agalactiae NOT DETECTED NOT DETECTED Final   Streptococcus pneumoniae NOT DETECTED  NOT DETECTED Final   Streptococcus pyogenes NOT DETECTED NOT DETECTED Final   A.calcoaceticus-baumannii NOT DETECTED NOT DETECTED Final   Bacteroides fragilis NOT DETECTED NOT DETECTED Final   Enterobacterales NOT DETECTED NOT DETECTED Final   Enterobacter cloacae complex NOT DETECTED NOT DETECTED Final   Escherichia coli NOT DETECTED NOT DETECTED Final   Klebsiella aerogenes NOT DETECTED NOT DETECTED Final   Klebsiella oxytoca NOT DETECTED NOT DETECTED Final   Klebsiella pneumoniae NOT DETECTED NOT DETECTED Final   Proteus species NOT DETECTED NOT DETECTED Final   Salmonella species NOT DETECTED NOT DETECTED Final   Serratia marcescens NOT DETECTED NOT DETECTED Final   Haemophilus influenzae NOT DETECTED NOT DETECTED Final   Neisseria meningitidis NOT DETECTED NOT DETECTED Final   Pseudomonas aeruginosa NOT DETECTED NOT DETECTED Final   Stenotrophomonas maltophilia NOT DETECTED NOT DETECTED Final   Candida albicans NOT DETECTED NOT DETECTED Final   Candida auris NOT DETECTED NOT DETECTED Final   Candida glabrata NOT DETECTED NOT DETECTED Final   Candida krusei NOT DETECTED NOT DETECTED Final   Candida parapsilosis NOT DETECTED NOT DETECTED Final   Candida tropicalis NOT DETECTED NOT DETECTED Final   Cryptococcus neoformans/gattii NOT DETECTED NOT DETECTED Final    Comment: Performed at [facility], [address] [place], [place] [address]  Culture, blood (routine x 2)     Status: None (Preliminary result)   Collection Time: 04/21/21  1:17 PM   Specimen: BLOOD RIGHT FOREARM  Result Value Ref Range Status   Specimen Description BLOOD RIGHT FOREARM  Final   Special Requests   Final    BOTTLES DRAWN  AEROBIC AND ANAEROBIC Blood Culture results may not be optimal due to an inadequate volume of blood received in culture bottles   Culture   Final    NO GROWTH 2 DAYS Performed at [facility], [place] [address] [place], [place] [address]    Report Status PENDING  Incomplete  Culture, blood (routine x 2)     Status: None (Preliminary result)   Collection Time: 04/21/21  1:22 PM   Specimen: BLOOD  Result Value Ref Range Status   Specimen Description BLOOD SITE NOT SPECIFIED  Final   Special Requests   Final    BOTTLES DRAWN AEROBIC AND ANAEROBIC Blood Culture results may not be optimal due to an inadequate volume of blood received in culture bottles   Culture   Final    NO GROWTH 2 DAYS Performed at [facility], [place] [address], [place],  [address]    Report Status PENDING  Incomplete  Resp Panel by RT-PCR (Flu A&B, Covid) Urine, Clean Catch     Status: None   Collection Time: 04/21/21  4:18 PM   Specimen: Urine, Clean Catch; Nasopharyngeal(NP) swabs in vial transport medium  Result Value Ref Range Status   SARS Coronavirus 2 by RT PCR NEGATIVE NEGATIVE Final    Comment: (NOTE) SARS-CoV-2 target nucleic acids are NOT DETECTED.  The SARS-CoV-2 RNA is generally detectable in upper respiratory specimens during the acute phase of infection. The lowest concentration of SARS-CoV-2 viral copies this assay can detect is 138 copies/mL. A negative result does not preclude SARS-Cov-2 infection and should not be used as the sole basis for treatment or other patient management decisions. A negative result may occur with  improper specimen collection/handling, submission of specimen other than nasopharyngeal swab, presence of viral mutation(s) within the areas targeted by this assay, and inadequate number of viral  copies(<138 copies/mL). A negative result must be combined with clinical observations, patient history, and epidemiological information. The expected result is  Negative.  Fact Sheet for Patients:  EntrepreneurPulse.com.au  Fact Sheet for Healthcare Providers:  IncredibleEmployment.be  This test is no t yet approved or cleared by the [place] FDA and  has been authorized for detection and/or diagnosis of SARS-CoV-2 by FDA under an Emergency Use Authorization (EUA). This EUA will remain  in effect (meaning this test can be used) for the duration of the COVID-19 declaration under Section 564(b)(1) of the Act, 21 U.S.C.section 360bbb-3(b)(1), unless the authorization is terminated  or revoked sooner.       Influenza A by PCR NEGATIVE NEGATIVE Final   Influenza B by PCR NEGATIVE NEGATIVE Final    Comment: (NOTE) The Xpert Xpress SARS-CoV-2/FLU/RSV plus assay is intended as an aid in the diagnosis of influenza from Nasopharyngeal swab specimens and should not be used as a sole basis for treatment. Nasal washings and aspirates are unacceptable for Xpert Xpress SARS-CoV-2/FLU/RSV testing.  Fact Sheet for Patients: EntrepreneurPulse.com.au  Fact Sheet for Healthcare Providers: IncredibleEmployment.be  This test is not yet approved or cleared by the [place] FDA and has been authorized for detection and/or diagnosis of SARS-CoV-2 by FDA under an Emergency Use Authorization (EUA). This EUA will remain in effect (meaning this test can be used) for the duration of the COVID-19 declaration under Section 564(b)(1) of the Act, 21 U.S.C. section 360bbb-3(b)(1), unless the authorization is terminated or revoked.  Performed at [facility], [place] [address], [place], [place] [address]   Urine culture     Status: Abnormal   Collection Time: 04/21/21  9:18 PM   Specimen: Urine, Random  Result Value Ref Range Status   Specimen Description URINE, RANDOM  Final   Special Requests   Final    NONE Performed at [facility], [place] [address] [place], [place]  [address]    Culture MULTIPLE SPECIES PRESENT, SUGGEST RECOLLECTION (A)  Final   Report Status 04/23/2021 FINAL  Final    Studies/Results: CT ANGIO HEAD W OR WO CONTRAST  Result Date: 04/22/2021 CLINICAL DATA:  Follow-up left subclavian thrombus EXAM: CT ANGIOGRAPHY HEAD AND NECK TECHNIQUE: Multidetector CT imaging of the head and neck was performed using the standard protocol during bolus administration of intravenous contrast. Multiplanar CT image reconstructions and MIPs were obtained to evaluate the vascular anatomy. Carotid stenosis measurements (when applicable) are obtained utilizing NASCET criteria, using the distal internal carotid diameter as the denominator. CONTRAST:  65mL OMNIPAQUE IOHEXOL 350 MG/ML SOLN COMPARISON:  CT from 04/19/2021 and 12/21/2019 FINDINGS: CT HEAD FINDINGS Brain: Atrophy with chronic microvascular ischemic disease again noted. Remote lacunar infarct at the right basal ganglia. Streak artifact from left-sided DBS electrode as well as its associated wire at the right parieto-occipital scalp. No acute intracranial hemorrhage. No acute large vessel territory infarct. No mass lesion or midline shift. No hydrocephalus or visible extra-axial fluid collection. Vascular: No hyperdense vessel. Skull: Scalp soft tissues and calvarium demonstrate no acute finding. Sinuses: Partial opacification of the lateral recess of the left sphenoid sinus. Otherwise clear. Orbits: No acute finding. Prior ocular lens replacement. Few retained subcentimeter metallic densities noted anterior to the left globe as well as the right temporal scalp (series 5, image 17). Review of the MIP images confirms the above findings CTA NECK FINDINGS Aortic arch: Visualized aortic arch normal caliber with normal branch pattern. Mild plaque about the arch and innominate  artery without stenosis, stable. 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 Few scattered superimposed calcific densities noted. No significant intraluminal narrowing. Left subclavian artery widely patent distally. Right carotid system: Right CCA patent from its origin to the bifurcation without stenosis. Calcified plaque about the proximal right ICA without significant stenosis. Right ICA widely patent distally. Left carotid system: Left CCA patent from its origin to the bifurcation without stenosis. Calcified plaque about the left carotid bulb/proximal left ICA without hemodynamically significant stenosis. Left ICA widely patent distally. Vertebral arteries: Both vertebral arteries arise from subclavian arteries. Vertebral arteries mildly tortuous but are widely patent within the neck without stenosis or other abnormality. Skeleton: No visible acute osseous finding. No discrete or worrisome osseous lesions. Mild cervical spondylosis without high-grade stenosis. Other neck: Negative for mass or adenopathy. No other acute abnormality. Upper chest: Generator for DBS implant in place within the anterior right chest wall. 4 mm subpleural right upper lobe nodule at the posterior right lung (series 6, image 186). Visualized upper chest demonstrates no other acute finding. Review of the MIP images confirms the above findings CTA HEAD FINDINGS Anterior circulation: Petrous segments patent bilaterally. Mild atheromatous change within the carotid siphons without hemodynamically significant stenosis. A1 segments patent bilaterally. Right A1 hypoplastic, accounting for the diminutive right ICA is compared to the left. Normal anterior communicating artery complex. Anterior cerebral arteries patent to their distal aspects without stenosis. No M1 stenosis or occlusion. Normal MCA bifurcations. Distal MCA branches well perfused and symmetric. Distal small vessel atheromatous irregularity noted about the MCA branches bilaterally.  Posterior circulation: Both V4 segments widely patent to the vertebrobasilar junction. Left vertebral dominant. Right PICA origin patent. Left PICA not seen. Basilar widely patent to its distal aspect. Superior cerebral arteries patent bilaterally. Both PCAs primarily supplied via the basilar. Atheromatous change seen involving the distal PCAs bilaterally with associated moderate to severe bilateral P3 stenoses, right worse than left. Venous sinuses: Patent allowing for timing the contrast bolus. Anatomic variants: Hypoplastic right A1 segment. Dominant left vertebral artery. No aneurysm. IMPRESSION: CT HEAD IMPRESSION: 1. Stable head CT. No acute intracranial abnormality. 2. Atrophy with chronic small vessel ischemic disease with superimposed chronic right basal ganglia lacunar infarct. 3. Stable left DBS electrode. CTA HEAD AND NECK IMPRESSION: 1. 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 2. Mild atheromatous change about the carotid bifurcations and carotid siphons without significant stenosis. No hemodynamically significant or correctable stenosis about the major arterial vasculature of the head and neck. 3. Distal small vessel atheromatous irregularity within the intracranial circulation, with moderate to severe bilateral P3 stenoses, right worse than left. 4. 4 mm right upper lobe pulmonary nodule, indeterminate. No follow-up needed if patient is low-risk. Non-contrast chest CT can be considered in 12 months if patient is high-risk. This recommendation follows the consensus statement: Guidelines for Management of Incidental Pulmonary Nodules Detected on CT Images: From the Fleischner Society 2017; Radiology 2017; 284:228-243. Electronically Signed   By: Jeannine Boga M.D.   On: 04/22/2021 21:47   CT ANGIO NECK W OR WO CONTRAST  Result Date: 04/22/2021 CLINICAL DATA:   Follow-up left subclavian thrombus. EXAM: CT ANGIOGRAPHY HEAD AND NECK TECHNIQUE: Multidetector CT imaging of the head and neck was performed using the standard protocol during bolus administration of intravenous  contrast. Multiplanar CT image reconstructions and MIPs were obtained to evaluate the vascular anatomy. Carotid stenosis measurements (when applicable) are obtained utilizing NASCET criteria, using the distal internal carotid diameter as the denominator. CONTRAST:  50mL OMNIPAQUE IOHEXOL 350 MG/ML SOLN COMPARISON:  CT from 04/19/2021 and 12/21/2019 FINDINGS: CT HEAD FINDINGS Brain: Atrophy with chronic microvascular ischemic disease again noted. Remote lacunar infarct at the right basal ganglia. Streak artifact from left-sided DBS electrode as well as its associated wire at the right parieto-occipital scalp. No acute intracranial hemorrhage. No acute large vessel territory infarct. No mass lesion or midline shift. No hydrocephalus or visible extra-axial fluid collection. Vascular: No hyperdense vessel. Skull: Scalp soft tissues and calvarium demonstrate no acute finding. Sinuses: Partial opacification of the lateral recess of the left sphenoid sinus. Otherwise clear. Orbits: No acute finding. Prior ocular lens replacement. Few retained subcentimeter metallic densities noted anterior to the left globe as well as the right temporal scalp (series 5, image 17). Review of the MIP images confirms the above findings CTA NECK FINDINGS Aortic arch: Visualized aortic arch normal caliber with normal branch pattern. Mild plaque about the arch and innominate artery without stenosis, stable. 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 Few scattered superimposed calcific densities noted. No significant intraluminal narrowing. Left subclavian artery widely patent distally. Right  carotid system: Right CCA patent from its origin to the bifurcation without stenosis. Calcified plaque about the proximal right ICA without significant stenosis. Right ICA widely patent distally. Left carotid system: Left CCA patent from its origin to the bifurcation without stenosis. Calcified plaque about the left carotid bulb/proximal left ICA without hemodynamically significant stenosis. Left ICA widely patent distally. Vertebral arteries: Both vertebral arteries arise from subclavian arteries. Vertebral arteries mildly tortuous but are widely patent within the neck without stenosis or other abnormality. Skeleton: No visible acute osseous finding. No discrete or worrisome osseous lesions. Mild cervical spondylosis without high-grade stenosis. Other neck: Negative for mass or adenopathy. No other acute abnormality. Upper chest: Generator for DBS implant in place within the anterior right chest wall. 4 mm subpleural right upper lobe nodule at the posterior right lung (series 6, image 186). Visualized upper chest demonstrates no other acute finding. Review of the MIP images confirms the above findings CTA HEAD FINDINGS Anterior circulation: Petrous segments patent bilaterally. Mild atheromatous change within the carotid siphons without hemodynamically significant stenosis. A1 segments patent bilaterally. Right A1 hypoplastic, accounting for the diminutive right ICA is compared to the left. Normal anterior communicating artery complex. Anterior cerebral arteries patent to their distal aspects without stenosis. No M1 stenosis or occlusion. Normal MCA bifurcations. Distal MCA branches well perfused and symmetric. Distal small vessel atheromatous irregularity noted about the MCA branches bilaterally. Posterior circulation: Both V4 segments widely patent to the vertebrobasilar junction. Left vertebral dominant. Right PICA origin patent. Left PICA not seen. Basilar widely patent to its distal aspect. Superior cerebral  arteries patent bilaterally. Both PCAs primarily supplied via the basilar. Atheromatous change seen involving the distal PCAs bilaterally with associated moderate to severe bilateral P3 stenoses, right worse than left. Venous sinuses: Patent allowing for timing the contrast bolus. Anatomic variants: Hypoplastic right A1 segment. Dominant left vertebral artery. No aneurysm. Review of the MIP images confirms the above findings IMPRESSION: CT HEAD IMPRESSION: 1. Stable head CT.  No acute intracranial abnormality. 2. Atrophy with chronic small vessel ischemic disease  with superimposed chronic right basal ganglia lacunar infarct. 3. Stable left DBS electrode. CTA HEAD AND NECK IMPRESSION: 1. 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 2. Mild atheromatous change about the carotid bifurcations and carotid siphons without significant stenosis. No hemodynamically significant or correctable stenosis about the major arterial vasculature of the head and neck. 3. Distal small vessel atheromatous irregularity within the intracranial circulation, with moderate to severe bilateral P3 stenoses, right worse than left. 4. 4 mm right upper lobe pulmonary nodule, indeterminate. No follow-up needed if patient is low-risk. Non-contrast chest CT can be considered in 12 months if patient is high-risk. This recommendation follows the consensus statement: Guidelines for Management of Incidental Pulmonary Nodules Detected on CT Images: From the Fleischner Society 2017; Radiology 2017; 284:228-243. Electronically Signed   By: Jeannine Boga M.D.   On: 04/22/2021 21:42      Assessment/Plan:  INTERVAL HISTORY: TEE is without vegetations  Principal Problem:   Bacteremia, coagulase-negative staphylococcal Active Problems:   Diabetes mellitus type 2 in obese (HCC)   OSA on CPAP   Essential tremor    Chronic kidney disease   Obesity (BMI 30-39.9)   History of pulmonary embolism   Recent cerebrovascular accident (CVA)    Joshua [name] is a 71 y.o. male with history of deep brain stimulator for his tremor prior stroke with left eye blindness TIA history of DVT developed sudden onset of slurred speech while he was in [place].  He went to ER in [place] where blood cultures were performed during work-up apparently at only 1 site with cultures yielding a staph epidermidis.  He was then instructed to seek care at the nearest ER when called by the ER in [place].  He came to [facility] and was admitted blood cultures were taken again.  His stroke work-up further underway.  Blood cultures from 27 May yielded a Devil coccus hominis in 1 site and no growth from the other site.  We were consulted yesterday with regards to his positive blood culture and blood cultures repeated from 2 more sites and they are not growing any organisms so far.  Is currently on daptomycin.  He has now undergone transesophageal echocardiogram to ensure he does not have evidence of endocarditis.  All of his valves are clean and without evidence of infection.  Staphylococcal species isolated in [place] was a contaminant as is the Staph hominis that was isolated here.  His antimicrobial should be discontinued.  I spent greater than 35 minutes with the patient including greater than 50% of time in face to face counsel of the patient and review of his radiographs personally, his laboratory microbiological data and in coordination of his care.  I will sign off for now please call further questions.        LOS: 2 days   Alcide Evener 04/23/2021, 3:22 PM

## 2021-04-23 NOTE — Progress Notes (Signed)
Patient refused CPAP for tonight. Unit still at bedside.

## 2021-04-23 NOTE — Progress Notes (Signed)
PROGRESS NOTE  Joshua [name]  XKP:[id number] DOB: 1951/07/01 DOA: 04/21/2021 PCP: [name], MD   Brief Narrative: Joshua [name] is a 70 y.o. 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   He had developed slurred speech and abnormal sensation of right hand while [place] ([place] on [place], admitted to local hospital and subsequently discharged after CT showed no acute findings. MRI not performed due to deep brain stimulator. When he returned back near [place], he was called for 1 of 2 collections of blood cultures that were positive, directed to local ED on 5/27. He denied symptoms of bacteremia at that time. In [facility] ED he was afebrile without leukocytosis, not tachycardic or hypotensive, with normal lactic acid, no evidence of infection on UA or CXR. Despite still having some right hand symptoms and slurred speech, CT head was without acute findings. Blood cultures were repeated and he was discharged, only to be called on 5/29 for Staph spp. growing in those blood cultures, prompting return to ED at [facility]. Here he has been afebrile with WBC 8.6k, no WBCs on UA, negative covid and influenza testing. Ancef was started with ID consultation, TEE requested, and the patient was admitted. Antibiotics changed to daptomycin due to possibility of resistance pending further culture data.   Assessment & Plan: Principal Problem:   Bacteremia, coagulase-negative staphylococcal Active Problems:   Diabetes mellitus type 2 in obese (HCC)   OSA on CPAP   Essential tremor   Chronic kidney disease   Obesity (BMI 30-39.9)   History of pulmonary embolism   Recent cerebrovascular accident (CVA)  Coagulase negative staphylococcus hominis bacteremia: Suspected to be persistent from cultures at OSH 5/25 and again here 5/27 though  no clinical evidence of nidus or sepsis.  - Monitor repeat blood cultures during this admission from 5/29.  - ID consulted. TEE 5/31 negative for vegetation. Continue daptomycin.  Suspected recent CVA, hx CVAs with left eye visual field deficit, subclavian thrombus, continues to have slurred speech and right hand abnormality. CT head with atrophy, chronic small vessel ischemic white matter changes and chronic right basal ganglia lacunar infarcts.  - Appreciate neurology recommendations.  - Continue anticoagulation - Continue statin - PT/OT/SLP.  - RN to contact DBS representative to troubleshoot device in hopes of getting MRI performed soon. This DBS was in place at the time of MRI brain on 08/30/2019. There is impedance error message on his remote currently. May repeat CT head in a couple days given continued high index of suspicion for stroke, though CT head was nonacute on 5/27 and CTA 5/30.   Left subclavian artery thrombus: Persistently seen on CTA 5/30.  - Will continue on anticoagulation in light of this remaining.  Chronic HFpEF, HTN, HCM: Echo 03/08/2021 w/LVEF 55-60%, G1DD, GLS pattern, marked LVH suggestive of cardiac amyloidosis. - Monitor volume status - Continue norvasc 10mg , metoprolol 25mg  po BID, losartan 100mg , and prn lasix (not currently required) - Outpatient cardiology follow up, has scheduled appointment with Dr. Gardiner Rhyme on 6/2 and 6/7.  IDT2DM: HbA1c 8.9% in March 2022.  - Holding home metformin - Continue lantus and SSI, well-controlled  Stage IIIa CKD: Stable. CrCl ~21ml/min.  - Avoid nephrotoxins. Did received contrast 5/30, will monitor for CIN.   HLD: Note also coronary CT calcium score was 588. - Continue  atorvastatin, zetia, fenofibrate  Dyspnea, history of provoked DVT/PE: CXR clear 5/27. - Monitor closely. Resolved.  OSA:  - CPAP  Essential tremor s/p DBS:  - Follow up with Dr. Carles Collet 6/14.  Obesity: Estimated body mass index is 31.08 kg/m as  calculated from the following:   Height as of this encounter: 6\' 4"  (1.93 m).   Weight as of this encounter: 115.8 kg.  DVT prophylaxis: Eliquis Code Status: Full Family Communication: None at bedside Disposition Plan:  Status is: Inpatient  Remains inpatient appropriate because:Ongoing diagnostic testing needed not appropriate for outpatient work up  Dispo: The patient is from: Home              Anticipated d/c is to: Home              Patient currently is not medically stable to d/c.   Difficult to place patient No  Consultants:   Neurology   Cardiology  Infectious disease  Procedures:   TEE 04/23/2021: Negative for vegetation.   Antimicrobials:  Ancef 5/29 - 5/30  Daptomycin 5/30 >>   Subjective: No major changes to right hand or speech since yesterday. No dyspnea, chest pain, fever or other complaints.   Objective: Vitals:   04/23/21 0900 04/23/21 1217 04/23/21 1400 04/23/21 1407  BP: 119/87 (!) 142/86 112/65 112/83  Pulse: 70  60 64  Resp: 17 11 13 13   Temp: 98.6 F (37 C)  97.7 F (36.5 C)   TempSrc: Oral  Temporal   SpO2: 98% 98% 99% 98%  Weight:      Height:        Intake/Output Summary (Last 24 hours) at 04/23/2021 1436 Last data filed at 04/23/2021 0400 Gross per 24 hour  Intake 309 ml  Output 750 ml  Net -441 ml   Filed Weights   04/21/21 1842 04/22/21 0545  Weight: 115.8 kg 115.8 kg   Gen: 70 y.o. male in no distress Pulm: Nonlabored breathing room air. Clear. CV: Regular rate and rhythm. No murmur, rub, or gallop. No JVD, no dependent edema. GI: Abdomen soft, non-tender, non-distended, with normoactive bowel sounds.  Ext: Warm, no deformities Skin: No rashes, lesions or ulcers on visualized skin. Neuro: Alert and oriented. Subtle dysdiadochokinesia and weakness in right hand/grip strength, still some slowed speech. No new focal neurological deficits. Psych: Judgement and insight appear fair. Mood euthymic & affect congruent. Behavior is  appropriate.    Data Reviewed: I have personally reviewed following labs and imaging studies  CBC: Recent Labs  Lab 04/19/21 2112 04/21/21 1233 04/22/21 0054 04/23/21 0717  WBC 9.3 8.6 9.0 7.8  NEUTROABS 3.8  --   --   --   HGB 13.3 13.5 12.5* 12.5*  HCT 40.0 43.4 37.9* 36.9*  MCV 89.7 94.1 90.7 88.5  PLT 231 230 206 295   Basic Metabolic Panel: Recent Labs  Lab 04/19/21 2112 04/21/21 1233 04/22/21 0054 04/23/21 0717  NA 142 141 140 140  K 3.6 4.1 3.8 3.8  CL 106 107 105 107  CO2 26 23 28 27   GLUCOSE 87 228* 147* 93  BUN 29* 21 22 26*  CREATININE 1.75* 1.68* 1.83* 1.74*  CALCIUM 8.7* 9.3 9.0 8.9   GFR: Estimated Creatinine Clearance: 54.2 mL/min (A) (by C-G formula based on SCr of 1.74 mg/dL (H)). Liver Function Tests: Recent Labs  Lab 04/19/21 2112 04/22/21 0054  AST 29 21  ALT 33 29  ALKPHOS 77 77  BILITOT 0.4 0.6  PROT 6.5 6.1*  ALBUMIN 3.9 3.3*   No results for input(s): LIPASE, AMYLASE in the last 168 hours. No results for input(s): AMMONIA in the last 168 hours. Coagulation Profile: Recent Labs  Lab 04/19/21 2120  INR 1.0   Cardiac Enzymes: Recent Labs  Lab 04/22/21 0054  CKTOTAL 115   BNP (last 3 results) No results for input(s): PROBNP in the last 8760 hours. HbA1C: Recent Labs    04/21/21 1617  HGBA1C 7.9*   CBG: Recent Labs  Lab 04/21/21 2121 04/22/21 0731 04/22/21 1116 04/22/21 1550 04/23/21 0727  GLUCAP 203* 101* 139* 187* 89   Lipid Profile: Recent Labs    04/23/21 0717  CHOL 110  HDL 23*  LDLCALC 53  TRIG 172*  CHOLHDL 4.8   Thyroid Function Tests: No results for input(s): TSH, T4TOTAL, FREET4, T3FREE, THYROIDAB in the last 72 hours. Anemia Panel: No results for input(s): VITAMINB12, FOLATE, FERRITIN, TIBC, IRON, RETICCTPCT in the last 72 hours. Urine analysis:    Component Value Date/Time   COLORURINE YELLOW 04/21/2021 1610   APPEARANCEUR HAZY (A) 04/21/2021 1610   LABSPEC 1.018 04/21/2021 1610    PHURINE 6.0 04/21/2021 1610   GLUCOSEU 150 (A) 04/21/2021 1610   HGBUR MODERATE (A) 04/21/2021 1610   BILIRUBINUR NEGATIVE 04/21/2021 1610   KETONESUR NEGATIVE 04/21/2021 1610   PROTEINUR 100 (A) 04/21/2021 1610   UROBILINOGEN 0.2 04/08/2011 0732   NITRITE NEGATIVE 04/21/2021 1610   LEUKOCYTESUR NEGATIVE 04/21/2021 1610   Recent Results (from the past 240 hour(s))  Culture, blood (Routine X 2) w Reflex to ID Panel     Status: None (Preliminary result)   Collection Time: 04/19/21  8:50 PM   Specimen: BLOOD  Result Value Ref Range Status   Specimen Description   Final    BLOOD BLOOD LEFT FOREARM Performed at [facility], [address] [place], [place] [address]    Special Requests   Final    BOTTLES DRAWN AEROBIC AND ANAEROBIC Blood Culture adequate volume Performed at [facility], [address] [place], [place] [address]    Culture   Final    NO GROWTH 3 DAYS Performed at [facility], [place] [address], [place], [place] [address]    Report Status PENDING  Incomplete  Culture, blood (Routine X 2) w Reflex to ID Panel     Status: Abnormal   Collection Time: 04/19/21 11:20 PM   Specimen: BLOOD  Result Value Ref Range Status   Specimen Description   Final    BLOOD RIGHT HAND Performed at [facility], [address] [place], [place] [address]    Special Requests   Final    BOTTLES DRAWN AEROBIC AND ANAEROBIC Blood Culture adequate volume Performed at [facility], [address], [place], [place] [address]    Culture  Setup Time   Final    GRAM POSITIVE COCCI IN CLUSTERS AEROBIC BOTTLE ONLY CRITICAL RESULT CALLED TO, READ BACK BY AND VERIFIED WITH: Camie Patience RN @0911  04/21/21 EB    Culture (A)  Final    STAPHYLOCOCCUS HOMINIS THE SIGNIFICANCE OF ISOLATING THIS ORGANISM FROM A SINGLE SET OF BLOOD CULTURES WHEN MULTIPLE SETS ARE DRAWN IS UNCERTAIN. PLEASE NOTIFY THE MICROBIOLOGY  DEPARTMENT WITHIN ONE WEEK IF SPECIATION AND SENSITIVITIES ARE REQUIRED. Performed at [facility], [place] [address], [place], [place] [address]    Report Status 04/23/2021 FINAL  Final  Blood Culture ID Panel (Reflexed)     Status: Abnormal   Collection Time:  04/19/21 11:20 PM  Result Value Ref Range Status   Enterococcus faecalis NOT DETECTED NOT DETECTED Final   Enterococcus Faecium NOT DETECTED NOT DETECTED Final   Listeria monocytogenes NOT DETECTED NOT DETECTED Final   Staphylococcus species DETECTED (A) NOT DETECTED Final    Comment: CRITICAL RESULT CALLED TO, READ BACK BY AND VERIFIED WITH: JERRI TEGRAM RN @0911  04/21/21 EB    Staphylococcus aureus (BCID) NOT DETECTED NOT DETECTED Final   Staphylococcus epidermidis NOT DETECTED NOT DETECTED Final   Staphylococcus lugdunensis NOT DETECTED NOT DETECTED Final   Streptococcus species NOT DETECTED NOT DETECTED Final   Streptococcus agalactiae NOT DETECTED NOT DETECTED Final   Streptococcus pneumoniae NOT DETECTED NOT DETECTED Final   Streptococcus pyogenes NOT DETECTED NOT DETECTED Final   A.calcoaceticus-baumannii NOT DETECTED NOT DETECTED Final   Bacteroides fragilis NOT DETECTED NOT DETECTED Final   Enterobacterales NOT DETECTED NOT DETECTED Final   Enterobacter cloacae complex NOT DETECTED NOT DETECTED Final   Escherichia coli NOT DETECTED NOT DETECTED Final   Klebsiella aerogenes NOT DETECTED NOT DETECTED Final   Klebsiella oxytoca NOT DETECTED NOT DETECTED Final   Klebsiella pneumoniae NOT DETECTED NOT DETECTED Final   Proteus species NOT DETECTED NOT DETECTED Final   Salmonella species NOT DETECTED NOT DETECTED Final   Serratia marcescens NOT DETECTED NOT DETECTED Final   Haemophilus influenzae NOT DETECTED NOT DETECTED Final   Neisseria meningitidis NOT DETECTED NOT DETECTED Final   Pseudomonas aeruginosa NOT DETECTED NOT DETECTED Final   Stenotrophomonas maltophilia NOT DETECTED NOT DETECTED Final   Candida albicans  NOT DETECTED NOT DETECTED Final   Candida auris NOT DETECTED NOT DETECTED Final   Candida glabrata NOT DETECTED NOT DETECTED Final   Candida krusei NOT DETECTED NOT DETECTED Final   Candida parapsilosis NOT DETECTED NOT DETECTED Final   Candida tropicalis NOT DETECTED NOT DETECTED Final   Cryptococcus neoformans/gattii NOT DETECTED NOT DETECTED Final    Comment: Performed at [facility], [address] [place], [place] [address]  Culture, blood (routine x 2)     Status: None (Preliminary result)   Collection Time: 04/21/21  1:17 PM   Specimen: BLOOD RIGHT FOREARM  Result Value Ref Range Status   Specimen Description BLOOD RIGHT FOREARM  Final   Special Requests   Final    BOTTLES DRAWN AEROBIC AND ANAEROBIC Blood Culture results may not be optimal due to an inadequate volume of blood received in culture bottles   Culture   Final    NO GROWTH 2 DAYS Performed at [facility], [place] [address] [place], [place] [address]    Report Status PENDING  Incomplete  Culture, blood (routine x 2)     Status: None (Preliminary result)   Collection Time: 04/21/21  1:22 PM   Specimen: BLOOD  Result Value Ref Range Status   Specimen Description BLOOD SITE NOT SPECIFIED  Final   Special Requests   Final    BOTTLES DRAWN AEROBIC AND ANAEROBIC Blood Culture results may not be optimal due to an inadequate volume of blood received in culture bottles   Culture   Final    NO GROWTH 2 DAYS Performed at [facility], [place] [address] [place],  [address]    Report Status PENDING  Incomplete  Resp Panel by RT-PCR (Flu A&B, Covid) Urine, Clean Catch     Status: None   Collection Time: 04/21/21  4:18 PM   Specimen: Urine, Clean Catch; Nasopharyngeal(NP) swabs in vial transport  medium  Result Value Ref Range Status   SARS Coronavirus 2 by RT PCR NEGATIVE NEGATIVE Final    Comment: (NOTE) SARS-CoV-2 target nucleic acids are NOT DETECTED.  The SARS-CoV-2 RNA is generally detectable in  upper respiratory specimens during the acute phase of infection. The lowest concentration of SARS-CoV-2 viral copies this assay can detect is 138 copies/mL. A negative result does not preclude SARS-Cov-2 infection and should not be used as the sole basis for treatment or other patient management decisions. A negative result may occur with  improper specimen collection/handling, submission of specimen other than nasopharyngeal swab, presence of viral mutation(s) within the areas targeted by this assay, and inadequate number of viral copies(<138 copies/mL). A negative result must be combined with clinical observations, patient history, and epidemiological information. The expected result is Negative.  Fact Sheet for Patients:  EntrepreneurPulse.com.au  Fact Sheet for Healthcare Providers:  IncredibleEmployment.be  This test is no t yet approved or cleared by the [place] FDA and  has been authorized for detection and/or diagnosis of SARS-CoV-2 by FDA under an Emergency Use Authorization (EUA). This EUA will remain  in effect (meaning this test can be used) for the duration of the COVID-19 declaration under Section 564(b)(1) of the Act, 21 U.S.C.section 360bbb-3(b)(1), unless the authorization is terminated  or revoked sooner.       Influenza A by PCR NEGATIVE NEGATIVE Final   Influenza B by PCR NEGATIVE NEGATIVE Final    Comment: (NOTE) The Xpert Xpress SARS-CoV-2/FLU/RSV plus assay is intended as an aid in the diagnosis of influenza from Nasopharyngeal swab specimens and should not be used as a sole basis for treatment. Nasal washings and aspirates are unacceptable for Xpert Xpress SARS-CoV-2/FLU/RSV testing.  Fact Sheet for Patients: EntrepreneurPulse.com.au  Fact Sheet for Healthcare Providers: IncredibleEmployment.be  This test is not yet approved or cleared by the [place] FDA and has been  authorized for detection and/or diagnosis of SARS-CoV-2 by FDA under an Emergency Use Authorization (EUA). This EUA will remain in effect (meaning this test can be used) for the duration of the COVID-19 declaration under Section 564(b)(1) of the Act, 21 U.S.C. section 360bbb-3(b)(1), unless the authorization is terminated or revoked.  Performed at [facility], [place] [address], [place], [place] [address]   Urine culture     Status: Abnormal   Collection Time: 04/21/21  9:18 PM   Specimen: Urine, Random  Result Value Ref Range Status   Specimen Description URINE, RANDOM  Final   Special Requests   Final    NONE Performed at [facility], [place] [address], [place], [place] [address]    Culture MULTIPLE SPECIES PRESENT, SUGGEST RECOLLECTION (A)  Final   Report Status 04/23/2021 FINAL  Final      Radiology Studies: CT ANGIO HEAD W OR WO CONTRAST  Result Date: 04/22/2021 CLINICAL DATA:  Follow-up left subclavian thrombus EXAM: CT ANGIOGRAPHY HEAD AND NECK TECHNIQUE: Multidetector CT imaging of the head and neck was performed using the standard protocol during bolus administration of intravenous contrast. Multiplanar CT image reconstructions and MIPs were obtained to evaluate the vascular anatomy. Carotid stenosis measurements (when applicable) are obtained utilizing NASCET criteria, using the distal internal carotid diameter as the denominator. CONTRAST:  82mL OMNIPAQUE IOHEXOL 350 MG/ML SOLN COMPARISON:  CT from 04/19/2021 and 12/21/2019 FINDINGS: CT HEAD FINDINGS Brain: Atrophy with chronic microvascular ischemic disease again noted. Remote lacunar infarct at the right basal ganglia. Streak artifact from left-sided DBS electrode as  well as its associated wire at the right parieto-occipital scalp. No acute intracranial hemorrhage. No acute large vessel territory infarct. No mass lesion or midline shift. No hydrocephalus or visible extra-axial fluid collection. Vascular: No hyperdense  vessel. Skull: Scalp soft tissues and calvarium demonstrate no acute finding. Sinuses: Partial opacification of the lateral recess of the left sphenoid sinus. Otherwise clear. Orbits: No acute finding. Prior ocular lens replacement. Few retained subcentimeter metallic densities noted anterior to the left globe as well as the right temporal scalp (series 5, image 17). Review of the MIP images confirms the above findings CTA NECK FINDINGS Aortic arch: Visualized aortic arch normal caliber with normal branch pattern. Mild plaque about the arch and innominate artery without stenosis, stable. 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 Few scattered superimposed calcific densities noted. No significant intraluminal narrowing. Left subclavian artery widely patent distally. Right carotid system: Right CCA patent from its origin to the bifurcation without stenosis. Calcified plaque about the proximal right ICA without significant stenosis. Right ICA widely patent distally. Left carotid system: Left CCA patent from its origin to the bifurcation without stenosis. Calcified plaque about the left carotid bulb/proximal left ICA without hemodynamically significant stenosis. Left ICA widely patent distally. Vertebral arteries: Both vertebral arteries arise from subclavian arteries. Vertebral arteries mildly tortuous but are widely patent within the neck without stenosis or other abnormality. Skeleton: No visible acute osseous finding. No discrete or worrisome osseous lesions. Mild cervical spondylosis without high-grade stenosis. Other neck: Negative for mass or adenopathy. No other acute abnormality. Upper chest: Generator for DBS implant in place within the anterior right chest wall. 4 mm subpleural right upper lobe nodule at the posterior right lung (series 6, image 186). Visualized  upper chest demonstrates no other acute finding. Review of the MIP images confirms the above findings CTA HEAD FINDINGS Anterior circulation: Petrous segments patent bilaterally. Mild atheromatous change within the carotid siphons without hemodynamically significant stenosis. A1 segments patent bilaterally. Right A1 hypoplastic, accounting for the diminutive right ICA is compared to the left. Normal anterior communicating artery complex. Anterior cerebral arteries patent to their distal aspects without stenosis. No M1 stenosis or occlusion. Normal MCA bifurcations. Distal MCA branches well perfused and symmetric. Distal small vessel atheromatous irregularity noted about the MCA branches bilaterally. Posterior circulation: Both V4 segments widely patent to the vertebrobasilar junction. Left vertebral dominant. Right PICA origin patent. Left PICA not seen. Basilar widely patent to its distal aspect. Superior cerebral arteries patent bilaterally. Both PCAs primarily supplied via the basilar. Atheromatous change seen involving the distal PCAs bilaterally with associated moderate to severe bilateral P3 stenoses, right worse than left. Venous sinuses: Patent allowing for timing the contrast bolus. Anatomic variants: Hypoplastic right A1 segment. Dominant left vertebral artery. No aneurysm. IMPRESSION: CT HEAD IMPRESSION: 1. Stable head CT. No acute intracranial abnormality. 2. Atrophy with chronic small vessel ischemic disease with superimposed chronic right basal ganglia lacunar infarct. 3. Stable left DBS electrode. CTA HEAD AND NECK IMPRESSION: 1. 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 2. Mild atheromatous change about the carotid bifurcations and carotid siphons without significant stenosis. No hemodynamically significant or correctable stenosis about the major arterial vasculature of  the head and neck. 3. Distal small vessel atheromatous irregularity within the intracranial circulation, with moderate to severe  bilateral P3 stenoses, right worse than left. 4. 4 mm right upper lobe pulmonary nodule, indeterminate. No follow-up needed if patient is low-risk. Non-contrast chest CT can be considered in 12 months if patient is high-risk. This recommendation follows the consensus statement: Guidelines for Management of Incidental Pulmonary Nodules Detected on CT Images: From the Fleischner Society 2017; Radiology 2017; 284:228-243. Electronically Signed   By: Jeannine Boga M.D.   On: 04/22/2021 21:47   CT ANGIO NECK W OR WO CONTRAST  Result Date: 04/22/2021 CLINICAL DATA:  Follow-up left subclavian thrombus. EXAM: CT ANGIOGRAPHY HEAD AND NECK TECHNIQUE: Multidetector CT imaging of the head and neck was performed using the standard protocol during bolus administration of intravenous contrast. Multiplanar CT image reconstructions and MIPs were obtained to evaluate the vascular anatomy. Carotid stenosis measurements (when applicable) are obtained utilizing NASCET criteria, using the distal internal carotid diameter as the denominator. CONTRAST:  48mL OMNIPAQUE IOHEXOL 350 MG/ML SOLN COMPARISON:  CT from 04/19/2021 and 12/21/2019 FINDINGS: CT HEAD FINDINGS Brain: Atrophy with chronic microvascular ischemic disease again noted. Remote lacunar infarct at the right basal ganglia. Streak artifact from left-sided DBS electrode as well as its associated wire at the right parieto-occipital scalp. No acute intracranial hemorrhage. No acute large vessel territory infarct. No mass lesion or midline shift. No hydrocephalus or visible extra-axial fluid collection. Vascular: No hyperdense vessel. Skull: Scalp soft tissues and calvarium demonstrate no acute finding. Sinuses: Partial opacification of the lateral recess of the left sphenoid sinus. Otherwise clear. Orbits: No acute finding. Prior ocular lens  replacement. Few retained subcentimeter metallic densities noted anterior to the left globe as well as the right temporal scalp (series 5, image 17). Review of the MIP images confirms the above findings CTA NECK FINDINGS Aortic arch: Visualized aortic arch normal caliber with normal branch pattern. Mild plaque about the arch and innominate artery without stenosis, stable. 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 Few scattered superimposed calcific densities noted. No significant intraluminal narrowing. Left subclavian artery widely patent distally. Right carotid system: Right CCA patent from its origin to the bifurcation without stenosis. Calcified plaque about the proximal right ICA without significant stenosis. Right ICA widely patent distally. Left carotid system: Left CCA patent from its origin to the bifurcation without stenosis. Calcified plaque about the left carotid bulb/proximal left ICA without hemodynamically significant stenosis. Left ICA widely patent distally. Vertebral arteries: Both vertebral arteries arise from subclavian arteries. Vertebral arteries mildly tortuous but are widely patent within the neck without stenosis or other abnormality. Skeleton: No visible acute osseous finding. No discrete or worrisome osseous lesions. Mild cervical spondylosis without high-grade stenosis. Other neck: Negative for mass or adenopathy. No other acute abnormality. Upper chest: Generator for DBS implant in place within the anterior right chest wall. 4 mm subpleural right upper lobe nodule at the posterior right lung (series 6, image 186). Visualized upper chest demonstrates no other acute finding. Review of the MIP images confirms the above findings CTA HEAD FINDINGS Anterior circulation: Petrous segments patent bilaterally. Mild atheromatous change within the  carotid siphons without hemodynamically significant stenosis. A1 segments patent bilaterally. Right A1 hypoplastic, accounting for the diminutive right ICA is compared to the left. Normal anterior communicating artery complex. Anterior cerebral arteries patent to their distal aspects without stenosis. No M1 stenosis or occlusion. Normal MCA bifurcations. Distal MCA branches well perfused and symmetric. Distal small  vessel atheromatous irregularity noted about the MCA branches bilaterally. Posterior circulation: Both V4 segments widely patent to the vertebrobasilar junction. Left vertebral dominant. Right PICA origin patent. Left PICA not seen. Basilar widely patent to its distal aspect. Superior cerebral arteries patent bilaterally. Both PCAs primarily supplied via the basilar. Atheromatous change seen involving the distal PCAs bilaterally with associated moderate to severe bilateral P3 stenoses, right worse than left. Venous sinuses: Patent allowing for timing the contrast bolus. Anatomic variants: Hypoplastic right A1 segment. Dominant left vertebral artery. No aneurysm. Review of the MIP images confirms the above findings IMPRESSION: CT HEAD IMPRESSION: 1. Stable head CT.  No acute intracranial abnormality. 2. Atrophy with chronic small vessel ischemic disease with superimposed chronic right basal ganglia lacunar infarct. 3. Stable left DBS electrode. CTA HEAD AND NECK IMPRESSION: 1. 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 2. Mild atheromatous change about the carotid bifurcations and carotid siphons without significant stenosis. No hemodynamically significant or correctable stenosis about the major arterial vasculature of the head and neck. 3. Distal small vessel atheromatous irregularity within the intracranial circulation, with moderate to severe bilateral P3 stenoses, right  worse than left. 4. 4 mm right upper lobe pulmonary nodule, indeterminate. No follow-up needed if patient is low-risk. Non-contrast chest CT can be considered in 12 months if patient is high-risk. This recommendation follows the consensus statement: Guidelines for Management of Incidental Pulmonary Nodules Detected on CT Images: From the Fleischner Society 2017; Radiology 2017; 284:228-243. Electronically Signed   By: Jeannine Boga M.D.   On: 04/22/2021 21:42    Scheduled Meds: . amLODipine  10 mg Oral QHS  . atorvastatin  80 mg Oral q1800  . colchicine  0.6 mg Oral BID  . DULoxetine  60 mg Oral Daily  . escitalopram  20 mg Oral Daily  . ezetimibe  10 mg Oral Daily  . fenofibrate  54 mg Oral Daily  . insulin aspart  0-15 Units Subcutaneous TID WC  . insulin glargine  80 Units Subcutaneous BID  . losartan  100 mg Oral Daily  . metoprolol tartrate  25 mg Oral BID  . mirtazapine  7.5 mg Oral QHS  . sodium chloride flush  3 mL Intravenous Q12H   Continuous Infusions: . DAPTOmycin (CUBICIN)  IV 950 mg (04/22/21 1254)     LOS: 2 days   Time spent: 35 minutes.  Patrecia Pour, MD [facility] [email or web address] 04/23/2021, 2:36 PM

## 2021-04-23 NOTE — Anesthesia Procedure Notes (Signed)
Procedure Name: MAC Date/Time: 04/23/2021 1:30 PM Performed by: Amadeo Garnet, CRNA Pre-anesthesia Checklist: Patient identified, Emergency Drugs available, Suction available and Patient being monitored Patient Re-evaluated:Patient Re-evaluated prior to induction Oxygen Delivery Method: Nasal cannula Preoxygenation: Pre-oxygenation with 100% oxygen Induction Type: IV induction Placement Confirmation: positive ETCO2 Dental Injury: Teeth and Oropharynx as per pre-operative assessment

## 2021-04-23 NOTE — Plan of Care (Signed)
  Problem: Education: Goal: Knowledge of disease or condition will improve Outcome: Progressing Goal: Knowledge of secondary prevention will improve Outcome: Progressing Goal: Knowledge of patient specific risk factors addressed and post discharge goals established will improve Outcome: Progressing Goal: Individualized Educational Video(s) Outcome: Progressing   Problem: Coping: Goal: Will verbalize positive feelings about self Outcome: Progressing Goal: Will identify appropriate support needs Outcome: Progressing   Problem: Health Behavior/Discharge Planning: Goal: Ability to manage health-related needs will improve Outcome: Progressing   Problem: Self-Care: Goal: Ability to participate in self-care as condition permits will improve Outcome: Progressing Goal: Ability to communicate needs accurately will improve Outcome: Progressing   Problem: Nutrition: Goal: Dietary intake will improve Outcome: Progressing

## 2021-04-23 NOTE — Interval H&P Note (Signed)
History and Physical Interval Note:  04/23/2021 1:12 PM  Joshua [name]  has presented today for surgery, with the diagnosis of endocarditis.  The various methods of treatment have been discussed with the patient and family. After consideration of risks, benefits and other options for treatment, the patient has consented to  Procedure(s): TRANSESOPHAGEAL ECHOCARDIOGRAM (TEE) (N/A) as a surgical intervention.  The patient's history has been reviewed, patient examined, no change in status, stable for surgery.  I have reviewed the patient's chart and labs.  Questions were answered to the patient's satisfaction.     [name]

## 2021-04-24 ENCOUNTER — Other Ambulatory Visit (HOSPITAL_COMMUNITY): Payer: Self-pay

## 2021-04-24 ENCOUNTER — Inpatient Hospital Stay (HOSPITAL_COMMUNITY): Payer: Medicare PPO

## 2021-04-24 ENCOUNTER — Encounter (HOSPITAL_COMMUNITY): Payer: Self-pay | Admitting: Cardiovascular Disease

## 2021-04-24 DIAGNOSIS — R7881 Bacteremia: Secondary | ICD-10-CM | POA: Diagnosis not present

## 2021-04-24 DIAGNOSIS — B957 Other staphylococcus as the cause of diseases classified elsewhere: Secondary | ICD-10-CM | POA: Diagnosis not present

## 2021-04-24 DIAGNOSIS — I6330 Cerebral infarction due to thrombosis of unspecified cerebral artery: Secondary | ICD-10-CM | POA: Insufficient documentation

## 2021-04-24 DIAGNOSIS — Z8673 Personal history of transient ischemic attack (TIA), and cerebral infarction without residual deficits: Secondary | ICD-10-CM | POA: Diagnosis not present

## 2021-04-24 DIAGNOSIS — E669 Obesity, unspecified: Secondary | ICD-10-CM | POA: Diagnosis not present

## 2021-04-24 DIAGNOSIS — E1169 Type 2 diabetes mellitus with other specified complication: Secondary | ICD-10-CM | POA: Diagnosis not present

## 2021-04-24 LAB — GLUCOSE, CAPILLARY
Glucose-Capillary: 89 mg/dL (ref 70–99)
Glucose-Capillary: 90 mg/dL (ref 70–99)
Glucose-Capillary: 161 mg/dL — ABNORMAL HIGH (ref 70–99)

## 2021-04-24 LAB — BASIC METABOLIC PANEL
Anion gap: 9 (ref 5–15)
BUN: 28 mg/dL — ABNORMAL HIGH (ref 8–23)
CO2: 26 mmol/L (ref 22–32)
Calcium: 9 mg/dL (ref 8.9–10.3)
Chloride: 107 mmol/L (ref 98–111)
Creatinine, Ser: 1.66 mg/dL — ABNORMAL HIGH (ref 0.61–1.24)
GFR, Estimated: 44 mL/min — ABNORMAL LOW (ref >60)
Glucose, Bld: 136 mg/dL — ABNORMAL HIGH (ref 70–99)
Potassium: 3.6 mmol/L (ref 3.5–5.1)
Sodium: 142 mmol/L (ref 135–145)

## 2021-04-24 MED ORDER — ASPIRIN 81 MG PO TBEC
81.0000 mg | DELAYED_RELEASE_TABLET | Freq: Every day | ORAL | 0 refills | Status: AC
  Filled 2021-04-24: qty 30, 30d supply, fill #0

## 2021-04-24 MED ORDER — ASPIRIN EC 81 MG PO TBEC
81.0000 mg | DELAYED_RELEASE_TABLET | Freq: Every day | ORAL | Status: DC
  Administered 2021-04-24: 81 mg via ORAL
  Filled 2021-04-24: qty 1

## 2021-04-24 NOTE — Progress Notes (Signed)
Discharge information and clothing were given to [place] and Wife [name]. Pt is being discharged home.

## 2021-04-24 NOTE — Discharge Summary (Signed)
Physician Discharge Summary  ECTOR LAUREL OMB:[id number] DOB: [birth year] DOA: 04/21/2021  PCP: [name], MD  Admit date: 04/21/2021 Discharge date: 04/24/2021  Admitted From: Home Disposition:  home  Recommendations for Outpatient Follow-up:  1. Follow up with PCP in 1-2 weeks 2. Follow up with Neurology as scheduled  Discharge Condition:Stable CODE STATUS:Full Diet recommendation: Diabetic, heart healthy   Brief/Interim Summary: 70 y.o. 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   He had developed slurred speech and abnormal sensation of right hand while [place] ([place] on 5[place], admitted to local hospital and subsequently discharged after CT showed no acute findings. MRI not performed due to deep brain stimulator. When he returned back near [place], he was called for 1 of 2 collections of blood cultures that were positive, directed to local ED on 5/27. He denied symptoms of bacteremia at that time. In [facility] ED he was afebrile without leukocytosis, not tachycardic or hypotensive, with normal lactic acid, no evidence of infection on UA or CXR. Despite still having some right hand symptoms and slurred speech, CT head was without acute findings. Blood cultures were repeated and he was discharged, only to be called on 5/29 for possible Staph spp. growing in those blood cultures, prompting return to ED at [facility]  Discharge Diagnoses:  Principal Problem:   Bacteremia, coagulase-negative staphylococcal Active Problems:   Diabetes mellitus type 2 in obese (HCC)   OSA on CPAP   Essential tremor   Chronic kidney disease   Obesity (BMI 30-39.9)   History of pulmonary embolism   Recent cerebrovascular accident (CVA)   Cerebral thrombosis with cerebral infarction  Coagulase negative staphylococcus hominis  bacteremia: Suspected to be persistent from cultures at OSH 5/25 and again here 5/27 though no clinical evidence of nidus or sepsis.  - ID was consulted. TEE 5/31 negative for vegetation.  - Per ID, blood culture results in question are most likely secondary to contaminant, not true bacteremia with recommendations to hold further abx. ID had since signed off  Left sided CVA ruled in, prior hx CVAs with left eye visual field deficit, subclavian thrombus -Pt continues to have slurred speech and right hand abnormality. CT head with atrophy, chronic small vessel ischemic white matter changes and chronic right basal ganglia lacunar infarcts.  - Appreciate neurology recommendations.  - Continue anticoagulation - Continue statin - PT/OT/SLP was consulted - Pt underwent MRI brain 6/1 with findings notable for small area of T2 hyperintensity in the L thalamocapsular region adjacent to the stimulator lead -discussed with Neurology. Recommendation to add 81mg  aspirin with current anticoagulant on d/c  Left subclavian artery thrombus: Persistently seen on CTA 5/30.  - Continued on anticoagulation in light of this remaining.  Chronic HFpEF, HTN, HCM: Echo 03/08/2021 w/LVEF 55-60%, G1DD, GLS pattern, marked LVH suggestive of cardiac amyloidosis. - Monitor volume status - Continue norvasc 10mg , metoprolol 25mg  po BID, losartan 100mg , and prn lasix (not currently required) - Outpatient cardiology follow up, has scheduled appointment with Dr. Gardiner Rhyme on 6/2 and 6/7.  IDT2DM: HbA1c 8.9% in March 2022.  - Held home metformin while in hospital - Continued lantus and SSI, well-controlled  Stage IIIa CKD: Stable. CrCl ~41ml/min.  - Avoid nephrotoxins.   HLD: Note also coronary CT calcium score was 588. - Continue atorvastatin, zetia, fenofibrate  Dyspnea, history  of provoked DVT/PE: CXR clear 5/27. - Resolved  OSA:  - Recommendation to continue CPAP as tolerated  Essential tremor s/p DBS:  -  Follow up with Dr. Carles Collet 6/14.  Obesity: Estimated body mass index is 31.08 kg/m as calculated from the following:   Height as of this encounter: 6\' 4"  (1.93 m).   Weight as of this encounter: 115.8 kg.   Discharge Instructions  Discharge Instructions    Ambulatory referral to Neurology   Complete by: As directed    An appointment is requested in approximately: 4 weeks     Allergies as of 04/24/2021      Reactions   Melatonin Other (See Comments)   dizzy      Medication List    TAKE these medications   amLODipine 10 MG tablet Commonly known as: NORVASC Take 1 tablet (10 mg total) by mouth at bedtime.   aspirin 81 MG EC tablet Take 1 tablet (81 mg total) by mouth daily. Swallow whole.   atorvastatin 80 MG tablet Commonly known as: LIPITOR Take 1 tablet (80 mg total) by mouth daily at 6 PM.   colchicine 0.6 MG tablet Take 1 tablet (0.6 mg total) by mouth 2 (two) times daily.   DULoxetine 60 MG capsule Commonly known as: CYMBALTA Take 60 mg by mouth daily.   Eliquis 5 MG Tabs tablet Generic drug: apixaban Take 1 tablet (5 mg total) by mouth 2 (two) times daily.   escitalopram 20 MG tablet Commonly known as: LEXAPRO Take 20 mg by mouth daily.   ezetimibe 10 MG tablet Commonly known as: ZETIA Take 1 tablet (10 mg total) by mouth daily.   fenofibrate 54 MG tablet Take 54 mg by mouth daily.   Fish Oil 1000 MG Caps Take 2,000 mg by mouth daily. Reported on 02/13/2016   furosemide 40 MG tablet Commonly known as: LASIX Take 1 tablet (40 mg total) by mouth as needed (Weight increase of 3 pounds in 1 day or 5 pounds in 1 week.).   insulin lispro 100 UNIT/ML KwikPen Commonly known as: HUMALOG Inject 15-25 Units into the skin See admin instructions. Inject 15 units subcutaneously after breakfast and 25 units after lunch and supper   losartan 100 MG tablet Commonly known as: COZAAR Take 1 tablet by mouth daily.   Lutein-Zeaxanthin 25-5 MG Caps Take 1 tablet by  mouth daily.   metFORMIN 500 MG tablet Commonly known as: GLUCOPHAGE Take 500 mg by mouth daily with breakfast.   metoprolol tartrate 25 MG tablet Commonly known as: LOPRESSOR Take 1 tablet (25 mg total) by mouth 2 (two) times daily.   mirtazapine 7.5 MG tablet Commonly known as: REMERON Take 7.5 mg by mouth at bedtime.   multivitamin with minerals Tabs tablet Take 1 tablet by mouth daily.   OneTouch Verio test strip Generic drug: glucose blood   PRESCRIPTION MEDICATION Inhale into the lungs at bedtime. CPAP   Restasis 0.05 % ophthalmic emulsion Generic drug: cycloSPORINE Place 1 drop into both eyes 2 (two) times daily as needed (dry eyes).   Sure Comfort Pen Needles 31G X 5 MM Misc Generic drug: Insulin Pen Needle   SYSTANE OP Place 1-2 drops into both eyes 4 (four) times daily as needed (dry eyes).   Toujeo SoloStar 300 UNIT/ML Solostar Pen Generic drug: insulin glargine (1 Unit Dial) Inject 80 Units into the skin 2 (two) times daily.       Follow-up Information    [name], MD. Schedule an appointment  as soon as possible for a visit in 2 week(s).   Specialty: Internal Medicine Contact information: [place] [address] [phone]        Donato Heinz, MD .   Specialties: Cardiology, Radiology Contact information: [address] [place] [address] [place] [address] [phone]              Allergies  Allergen Reactions  . Melatonin Other (See Comments)    dizzy    Consultations:  ID  Neurology  Procedures/Studies: CT ANGIO HEAD W OR WO CONTRAST  Result Date: 04/22/2021 CLINICAL DATA:  Follow-up left subclavian thrombus EXAM: CT ANGIOGRAPHY HEAD AND NECK TECHNIQUE: Multidetector CT imaging of the head and neck was performed using the standard protocol during bolus administration of intravenous contrast. Multiplanar CT image reconstructions and MIPs were obtained to evaluate the vascular anatomy. Carotid  stenosis measurements (when applicable) are obtained utilizing NASCET criteria, using the distal internal carotid diameter as the denominator. CONTRAST:  55mL OMNIPAQUE IOHEXOL 350 MG/ML SOLN COMPARISON:  CT from 04/19/2021 and 12/21/2019 FINDINGS: CT HEAD FINDINGS Brain: Atrophy with chronic microvascular ischemic disease again noted. Remote lacunar infarct at the right basal ganglia. Streak artifact from left-sided DBS electrode as well as its associated wire at the right parieto-occipital scalp. No acute intracranial hemorrhage. No acute large vessel territory infarct. No mass lesion or midline shift. No hydrocephalus or visible extra-axial fluid collection. Vascular: No hyperdense vessel. Skull: Scalp soft tissues and calvarium demonstrate no acute finding. Sinuses: Partial opacification of the lateral recess of the left sphenoid sinus. Otherwise clear. Orbits: No acute finding. Prior ocular lens replacement. Few retained subcentimeter metallic densities noted anterior to the left globe as well as the right temporal scalp (series 5, image 17). Review of the MIP images confirms the above findings CTA NECK FINDINGS Aortic arch: Visualized aortic arch normal caliber with normal branch pattern. Mild plaque about the arch and innominate artery without stenosis, stable. 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 Few scattered superimposed calcific densities noted. No significant intraluminal narrowing. Left subclavian artery widely patent distally. Right carotid system: Right CCA patent from its origin to the bifurcation without stenosis. Calcified plaque about the proximal right ICA without significant stenosis. Right ICA widely patent distally. Left carotid system: Left CCA patent from its origin to the bifurcation without stenosis. Calcified plaque about the left carotid  bulb/proximal left ICA without hemodynamically significant stenosis. Left ICA widely patent distally. Vertebral arteries: Both vertebral arteries arise from subclavian arteries. Vertebral arteries mildly tortuous but are widely patent within the neck without stenosis or other abnormality. Skeleton: No visible acute osseous finding. No discrete or worrisome osseous lesions. Mild cervical spondylosis without high-grade stenosis. Other neck: Negative for mass or adenopathy. No other acute abnormality. Upper chest: Generator for DBS implant in place within the anterior right chest wall. 4 mm subpleural right upper lobe nodule at the posterior right lung (series 6, image 186). Visualized upper chest demonstrates no other acute finding. Review of the MIP images confirms the above findings CTA HEAD FINDINGS Anterior circulation: Petrous segments patent bilaterally. Mild atheromatous change within the carotid siphons without hemodynamically significant stenosis. A1 segments patent bilaterally. Right A1 hypoplastic, accounting for the diminutive right ICA is compared to the left. Normal anterior communicating artery complex. Anterior cerebral arteries patent to their distal aspects without stenosis. No M1 stenosis or occlusion.  Normal MCA bifurcations. Distal MCA branches well perfused and symmetric. Distal small vessel atheromatous irregularity noted about the MCA branches bilaterally. Posterior circulation: Both V4 segments widely patent to the vertebrobasilar junction. Left vertebral dominant. Right PICA origin patent. Left PICA not seen. Basilar widely patent to its distal aspect. Superior cerebral arteries patent bilaterally. Both PCAs primarily supplied via the basilar. Atheromatous change seen involving the distal PCAs bilaterally with associated moderate to severe bilateral P3 stenoses, right worse than left. Venous sinuses: Patent allowing for timing the contrast bolus. Anatomic variants: Hypoplastic right A1  segment. Dominant left vertebral artery. No aneurysm. IMPRESSION: CT HEAD IMPRESSION: 1. Stable head CT. No acute intracranial abnormality. 2. Atrophy with chronic small vessel ischemic disease with superimposed chronic right basal ganglia lacunar infarct. 3. Stable left DBS electrode. CTA HEAD AND NECK IMPRESSION: 1. 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 2. Mild atheromatous change about the carotid bifurcations and carotid siphons without significant stenosis. No hemodynamically significant or correctable stenosis about the major arterial vasculature of the head and neck. 3. Distal small vessel atheromatous irregularity within the intracranial circulation, with moderate to severe bilateral P3 stenoses, right worse than left. 4. 4 mm right upper lobe pulmonary nodule, indeterminate. No follow-up needed if patient is low-risk. Non-contrast chest CT can be considered in 12 months if patient is high-risk. This recommendation follows the consensus statement: Guidelines for Management of Incidental Pulmonary Nodules Detected on CT Images: From the Fleischner Society 2017; Radiology 2017; 284:228-243. Electronically Signed   By: Jeannine Boga M.D.   On: 04/22/2021 21:47   CT Head Wo Contrast  Result Date: 04/19/2021 CLINICAL DATA:  Slurred speech EXAM: CT HEAD WITHOUT CONTRAST TECHNIQUE: Contiguous axial images were obtained from the base of the skull through the vertex without intravenous contrast. COMPARISON:  CT brain 01/29/2021 FINDINGS: Brain: No acute territorial infarction, hemorrhage or intracranial mass. Chronic lacunar infarct within the right basal ganglia and white matter. Mild atrophy and chronic small vessel ischemic change of the white matter. Left anterior deep brain stimulator with tip similar in position. Stable ventricle size. Vascular: No hyperdense vessels.   Carotid vascular calcification. Skull: Normal. Negative for fracture or focal lesion. Sinuses/Orbits: Chronic nasal bone deformity. Other: None IMPRESSION: 1. No CT evidence for acute intracranial abnormality. Atrophy and mild chronic small vessel ischemic changes of the white matter. Chronic lacunar infarcts right basal ganglia and white matter 2. Similar positioning of left deep brain stimulator Electronically Signed   By: Donavan Foil M.D.   On: 04/19/2021 23:45   CT ANGIO NECK W OR WO CONTRAST  Result Date: 04/22/2021 CLINICAL DATA:  Follow-up left subclavian thrombus. EXAM: CT ANGIOGRAPHY HEAD AND NECK TECHNIQUE: Multidetector CT imaging of the head and neck was performed using the standard protocol during bolus administration of intravenous contrast. Multiplanar CT image reconstructions and MIPs were obtained to evaluate the vascular anatomy. Carotid stenosis measurements (when applicable) are obtained utilizing NASCET criteria, using the distal internal carotid diameter as the denominator. CONTRAST:  59mL OMNIPAQUE IOHEXOL 350 MG/ML SOLN COMPARISON:  CT from 04/19/2021 and 12/21/2019 FINDINGS: CT HEAD FINDINGS Brain: Atrophy with chronic microvascular ischemic disease again noted. Remote lacunar infarct at the right basal ganglia. Streak artifact from left-sided DBS electrode as well as its associated wire at the right parieto-occipital scalp. No acute intracranial hemorrhage. No acute large vessel territory infarct. No mass lesion or midline shift. No hydrocephalus or  visible extra-axial fluid collection. Vascular: No hyperdense vessel. Skull: Scalp soft tissues and calvarium demonstrate no acute finding. Sinuses: Partial opacification of the lateral recess of the left sphenoid sinus. Otherwise clear. Orbits: No acute finding. Prior ocular lens replacement. Few retained subcentimeter metallic densities noted anterior to the left globe as well as the right temporal scalp (series 5, image 17). Review of  the MIP images confirms the above findings CTA NECK FINDINGS Aortic arch: Visualized aortic arch normal caliber with normal branch pattern. Mild plaque about the arch and innominate artery without stenosis, stable. 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 Few scattered superimposed calcific densities noted. No significant intraluminal narrowing. Left subclavian artery widely patent distally. Right carotid system: Right CCA patent from its origin to the bifurcation without stenosis. Calcified plaque about the proximal right ICA without significant stenosis. Right ICA widely patent distally. Left carotid system: Left CCA patent from its origin to the bifurcation without stenosis. Calcified plaque about the left carotid bulb/proximal left ICA without hemodynamically significant stenosis. Left ICA widely patent distally. Vertebral arteries: Both vertebral arteries arise from subclavian arteries. Vertebral arteries mildly tortuous but are widely patent within the neck without stenosis or other abnormality. Skeleton: No visible acute osseous finding. No discrete or worrisome osseous lesions. Mild cervical spondylosis without high-grade stenosis. Other neck: Negative for mass or adenopathy. No other acute abnormality. Upper chest: Generator for DBS implant in place within the anterior right chest wall. 4 mm subpleural right upper lobe nodule at the posterior right lung (series 6, image 186). Visualized upper chest demonstrates no other acute finding. Review of the MIP images confirms the above findings CTA HEAD FINDINGS Anterior circulation: Petrous segments patent bilaterally. Mild atheromatous change within the carotid siphons without hemodynamically significant stenosis. A1 segments patent bilaterally. Right A1 hypoplastic, accounting for the diminutive right ICA is  compared to the left. Normal anterior communicating artery complex. Anterior cerebral arteries patent to their distal aspects without stenosis. No M1 stenosis or occlusion. Normal MCA bifurcations. Distal MCA branches well perfused and symmetric. Distal small vessel atheromatous irregularity noted about the MCA branches bilaterally. Posterior circulation: Both V4 segments widely patent to the vertebrobasilar junction. Left vertebral dominant. Right PICA origin patent. Left PICA not seen. Basilar widely patent to its distal aspect. Superior cerebral arteries patent bilaterally. Both PCAs primarily supplied via the basilar. Atheromatous change seen involving the distal PCAs bilaterally with associated moderate to severe bilateral P3 stenoses, right worse than left. Venous sinuses: Patent allowing for timing the contrast bolus. Anatomic variants: Hypoplastic right A1 segment. Dominant left vertebral artery. No aneurysm. Review of the MIP images confirms the above findings IMPRESSION: CT HEAD IMPRESSION: 1. Stable head CT.  No acute intracranial abnormality. 2. Atrophy with chronic small vessel ischemic disease with superimposed chronic right basal ganglia lacunar infarct. 3. Stable left DBS electrode. CTA HEAD AND NECK IMPRESSION: 1. 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 2. Mild atheromatous change about the carotid bifurcations and carotid siphons without significant stenosis. No hemodynamically significant or correctable stenosis about the major arterial vasculature of the head and neck. 3. Distal small vessel atheromatous irregularity within the intracranial circulation, with moderate to severe bilateral P3 stenoses, right worse than left. 4. 4 mm right upper lobe pulmonary nodule, indeterminate. No follow-up needed if  patient is low-risk. Non-contrast chest CT can be considered in  12 months if patient is high-risk. This recommendation follows the consensus statement: Guidelines for Management of Incidental Pulmonary Nodules Detected on CT Images: From the Fleischner Society 2017; Radiology 2017; 284:228-243. Electronically Signed   By: Jeannine Boga M.D.   On: 04/22/2021 21:42   MR BRAIN WO CONTRAST  Addendum Date: 04/24/2021   ADDENDUM REPORT: 04/24/2021 14:22 ADDENDUM: Additional history of symptom onset 1 week prior including right sided weakness. A small area of T2 hyperintensity is present in the left thalamocapsular region adjacent to the stimulator lead. This is new from the prior study and could reflect an interval infarct. Possible that abnormal diffusion signal in this region is obscured by artifact and this may be subacute fitting with timeline. Electronically Signed   By: Macy Mis M.D.   On: 04/24/2021 14:22   Result Date: 04/24/2021 CLINICAL DATA:  Slurred speech EXAM: MRI HEAD WITHOUT CONTRAST TECHNIQUE: Multiplanar, multiecho pulse sequences of the brain and surrounding structures were obtained without intravenous contrast. COMPARISON:  October 2020 FINDINGS: Brain: There is susceptibility artifact associated with stimulator apparatus. The stimulator tip is in the left subthalamic region. There is no acute infarction or intracranial hemorrhage. There is no intracranial mass, mass effect, or edema. Patchy and confluent areas of T2 hyperintensity in the supratentorial white matter are nonspecific but probably reflect mild to moderate chronic microvascular ischemic changes. There are chronic small vessel infarcts of the basal ganglia, thalamus, and central white matter bilaterally. Prominence of the ventricles and sulci reflects generalized parenchymal volume loss. There is no hydrocephalus or extra-axial fluid collection. Vascular: Major arterial flow voids at the skull base are preserved. Loss of the flow void of the left internal jugular vein probably due to  slow flow, noting patency on CTA. Skull and upper cervical spine: Normal marrow signal is preserved. Sinuses/Orbits: Paranasal sinuses are aerated. Susceptibility artifact from foreign body partially obscures the left globe. Other: Sella is unremarkable.  Mastoid air cells are clear. IMPRESSION: No evidence of recent infarction, hemorrhage, or mass. Chronic microvascular ischemic changes with several chronic small vessel infarcts. Electronically Signed: By: Macy Mis M.D. On: 04/24/2021 13:29   DG Chest Port 1 View  Result Date: 04/19/2021 CLINICAL DATA:  Slurred speech for 2 days with positive blood cultures, initial encounter EXAM: PORTABLE CHEST 1 VIEW COMPARISON:  01/29/2021 FINDINGS: Cardiac shadow is within normal limits. Stimulator pack is again noted on the right. The lungs are clear. Minimal scarring is noted in the left base. No bony abnormality is noted. IMPRESSION: No active disease. Electronically Signed   By: Inez Catalina M.D.   On: 04/19/2021 23:37   ECHO TEE  Result Date: 04/23/2021    TRANSESOPHOGEAL ECHO REPORT   [name] Date of Exam: 04/23/2021 Medical Rec #:  [id number]       Height:       76.0 in Accession #:    [id number]      Weight:       255.3 lb Date of Birth:  [birth year]       BSA:          2.457 m Patient Age:    71 years        BP:           129/80 mmHg Patient Gender: M               HR:  63 bpm. Exam Location:  Inpatient Procedure: Transesophageal Echo and Color Doppler Indications:     Bacteremia  History:         Patient has prior history of Echocardiogram examinations, most                  recent 03/08/2021.  Sonographer:     Philipp Deputy Referring Phys:  [place] Diagnosing Phys: [name] MD PROCEDURE: After discussion of the risks and benefits of a TEE, an informed consent was obtained from the patient. The transesophogeal probe was passed without difficulty through the esophogus of the patient. Imaged were obtained  with the patient in a left lateral decubitus position. Sedation performed by different physician. The patient was monitored while under deep sedation. Anesthestetic sedation was provided intravenously by Anesthesiology: 202mg  of Propofol. Image quality was adequate. The patient developed no complications during the procedure. IMPRESSIONS  1. Left ventricular ejection fraction, by estimation, is 60 to 65%. The left ventricle has normal function.  2. Right ventricular systolic function is normal. The right ventricular size is normal.  3. No left atrial/left atrial appendage thrombus was detected.  4. Systolic anterior motion of the mitral valve leaflets is present . The mitral valve is normal in structure. No evidence of mitral valve regurgitation.  5. The aortic valve is normal in structure. Aortic valve regurgitation is not visualized. FINDINGS  Left Ventricle: Left ventricular ejection fraction, by estimation, is 60 to 65%. The left ventricle has normal function. The left ventricular internal cavity size was normal in size. Right Ventricle: The right ventricular size is normal. Right vetricular wall thickness was not well visualized. Right ventricular systolic function is normal. Left Atrium: Left atrial size was normal in size. No left atrial/left atrial appendage thrombus was detected. Right Atrium: Right atrial size was normal in size. Pericardium: There is no evidence of pericardial effusion. Mitral Valve: Systolic anterior motion of the mitral valve leaflets is present. The mitral valve is normal in structure. No evidence of mitral valve regurgitation. There is no evidence of mitral valve vegetation. Tricuspid Valve: The tricuspid valve is normal in structure. Tricuspid valve regurgitation is trivial. Aortic Valve: The aortic valve is normal in structure. Aortic valve regurgitation is not visualized. There is no evidence of aortic valve vegetation. Pulmonic Valve: The pulmonic valve was grossly normal.  Pulmonic valve regurgitation is mild. Aorta: The aortic root, ascending aorta and aortic arch are all structurally normal, with no evidence of dilitation or obstruction. IAS/Shunts: The atrial septum is grossly normal. [name] MD Electronically signed by [name] MD Signature Date/Time: 04/23/2021/5:22:19 PM    Final      Subjective: Eager to go home  Discharge Exam: Vitals:   04/24/21 1339 04/24/21 1340  BP:  138/82  Pulse: (P) 65 65  Resp:  14  Temp:  (!) 97.5 F (36.4 C)  SpO2:  98%   Vitals:   04/24/21 0614 04/24/21 0900 04/24/21 1339 04/24/21 1340  BP: 134/83 135/77  138/82  Pulse:  70 (P) 65 65  Resp:  16  14  Temp: 98 F (36.7 C) 97.9 F (36.6 C)  (!) 97.5 F (36.4 C)  TempSrc: Oral Oral  Oral  SpO2:  100%  98%  Weight:      Height:        General: Pt is alert, awake, not in acute distress Cardiovascular: RRR, S1/S2 + Respiratory: CTA bilaterally, no wheezing, no rhonchi Abdominal: Soft, NT, ND, bowel sounds +  Extremities: no edema, no cyanosis   The results of significant diagnostics from this hospitalization (including imaging, microbiology, ancillary and laboratory) are listed below for reference.     Microbiology: Recent Results (from the past 240 hour(s))  Culture, blood (Routine X 2) w Reflex to ID Panel     Status: None (Preliminary result)   Collection Time: 04/19/21  8:50 PM   Specimen: BLOOD  Result Value Ref Range Status   Specimen Description   Final    BLOOD BLOOD LEFT FOREARM Performed at [facility], [address] [place], [place] [address]    Special Requests   Final    BOTTLES DRAWN AEROBIC AND ANAEROBIC Blood Culture adequate volume Performed at [facility], [address] [place], [place] [address]    Culture   Final    NO GROWTH 4 DAYS Performed at [facility], [place] [address], [place], [place] [address]    Report Status PENDING  Incomplete  Culture, blood (Routine X 2)  w Reflex to ID Panel     Status: Abnormal   Collection Time: 04/19/21 11:20 PM   Specimen: BLOOD  Result Value Ref Range Status   Specimen Description   Final    BLOOD RIGHT HAND Performed at [facility], [address] [place], [place] [address]    Special Requests   Final    BOTTLES DRAWN AEROBIC AND ANAEROBIC Blood Culture adequate volume Performed at [facility], [address], [place], [place] [address]    Culture  Setup Time   Final    GRAM POSITIVE COCCI IN CLUSTERS AEROBIC BOTTLE ONLY CRITICAL RESULT CALLED TO, READ BACK BY AND VERIFIED WITH: Camie Patience RN @0911  04/21/21 EB    Culture (A)  Final    STAPHYLOCOCCUS HOMINIS THE SIGNIFICANCE OF ISOLATING THIS ORGANISM FROM A SINGLE SET OF BLOOD CULTURES WHEN MULTIPLE SETS ARE DRAWN IS UNCERTAIN. PLEASE NOTIFY THE MICROBIOLOGY DEPARTMENT WITHIN ONE WEEK IF SPECIATION AND SENSITIVITIES ARE REQUIRED. Performed at [facility], [place] [address] [place], [place] [address]    Report Status 04/23/2021 FINAL  Final  Blood Culture ID Panel (Reflexed)     Status: Abnormal   Collection Time: 04/19/21 11:20 PM  Result Value Ref Range Status   Enterococcus faecalis NOT DETECTED NOT DETECTED Final   Enterococcus Faecium NOT DETECTED NOT DETECTED Final   Listeria monocytogenes NOT DETECTED NOT DETECTED Final   Staphylococcus species DETECTED (A) NOT DETECTED Final    Comment: CRITICAL RESULT CALLED TO, READ BACK BY AND VERIFIED WITH: JERRI TEGRAM RN @0911  04/21/21 EB    Staphylococcus aureus (BCID) NOT DETECTED NOT DETECTED Final   Staphylococcus epidermidis NOT DETECTED NOT DETECTED Final   Staphylococcus lugdunensis NOT DETECTED NOT DETECTED Final   Streptococcus species NOT DETECTED NOT DETECTED Final   Streptococcus agalactiae NOT DETECTED NOT DETECTED Final   Streptococcus pneumoniae NOT DETECTED NOT DETECTED Final   Streptococcus pyogenes NOT DETECTED NOT DETECTED Final    A.calcoaceticus-baumannii NOT DETECTED NOT DETECTED Final   Bacteroides fragilis NOT DETECTED NOT DETECTED Final   Enterobacterales NOT DETECTED NOT DETECTED Final   Enterobacter cloacae complex NOT DETECTED NOT DETECTED Final   Escherichia coli NOT DETECTED NOT DETECTED Final   Klebsiella aerogenes NOT DETECTED NOT DETECTED Final   Klebsiella oxytoca NOT DETECTED NOT DETECTED Final   Klebsiella pneumoniae NOT DETECTED NOT DETECTED Final   Proteus species NOT DETECTED NOT DETECTED Final   Salmonella species NOT DETECTED NOT DETECTED  Final   Serratia marcescens NOT DETECTED NOT DETECTED Final   Haemophilus influenzae NOT DETECTED NOT DETECTED Final   Neisseria meningitidis NOT DETECTED NOT DETECTED Final   Pseudomonas aeruginosa NOT DETECTED NOT DETECTED Final   Stenotrophomonas maltophilia NOT DETECTED NOT DETECTED Final   Candida albicans NOT DETECTED NOT DETECTED Final   Candida auris NOT DETECTED NOT DETECTED Final   Candida glabrata NOT DETECTED NOT DETECTED Final   Candida krusei NOT DETECTED NOT DETECTED Final   Candida parapsilosis NOT DETECTED NOT DETECTED Final   Candida tropicalis NOT DETECTED NOT DETECTED Final   Cryptococcus neoformans/gattii NOT DETECTED NOT DETECTED Final    Comment: Performed at [facility], [place] [address], [place], [place] [address]  Culture, blood (routine x 2)     Status: None (Preliminary result)   Collection Time: 04/21/21  1:17 PM   Specimen: BLOOD RIGHT FOREARM  Result Value Ref Range Status   Specimen Description BLOOD RIGHT FOREARM  Final   Special Requests   Final    BOTTLES DRAWN AEROBIC AND ANAEROBIC Blood Culture results may not be optimal due to an inadequate volume of blood received in culture bottles   Culture   Final    NO GROWTH 3 DAYS Performed at [facility], [place] [address] [place], [place] [address]    Report Status PENDING  Incomplete  Culture, blood (routine x 2)     Status: None (Preliminary result)    Collection Time: 04/21/21  1:22 PM   Specimen: BLOOD  Result Value Ref Range Status   Specimen Description BLOOD SITE NOT SPECIFIED  Final   Special Requests   Final    BOTTLES DRAWN AEROBIC AND ANAEROBIC Blood Culture results may not be optimal due to an inadequate volume of blood received in culture bottles   Culture   Final    NO GROWTH 3 DAYS Performed at [facility], [place] [address] [place], [place] [address]    Report Status PENDING  Incomplete  Resp Panel by RT-PCR (Flu A&B, Covid) Urine, Clean Catch     Status: None   Collection Time: 04/21/21  4:18 PM   Specimen: Urine, Clean Catch; Nasopharyngeal(NP) swabs in vial transport medium  Result Value Ref Range Status   SARS Coronavirus 2 by RT PCR NEGATIVE NEGATIVE Final    Comment: (NOTE) SARS-CoV-2 target nucleic acids are NOT DETECTED.  The SARS-CoV-2 RNA is generally detectable in upper respiratory specimens during the acute phase of infection. The lowest concentration of SARS-CoV-2 viral copies this assay can detect is 138 copies/mL. A negative result does not preclude SARS-Cov-2 infection and should not be used as the sole basis for treatment or other patient management decisions. A negative result may occur with  improper specimen collection/handling, submission of specimen other than nasopharyngeal swab, presence of viral mutation(s) within the areas targeted by this assay, and inadequate number of viral copies(<138 copies/mL). A negative result must be combined with clinical observations, patient history, and epidemiological information. The expected result is Negative.  Fact Sheet for Patients:  EntrepreneurPulse.com.au  Fact Sheet for Healthcare Providers:  IncredibleEmployment.be  This test is no t yet approved or cleared by the [place] FDA and  has been authorized for detection and/or diagnosis of SARS-CoV-2 by FDA under an Emergency Use Authorization (EUA). This  EUA will remain  in effect (meaning this test can be used) for the duration of the COVID-19 declaration under Section 564(b)(1) of the Act, 21 U.S.C.section 360bbb-3(b)(1),  unless the authorization is terminated  or revoked sooner.       Influenza A by PCR NEGATIVE NEGATIVE Final   Influenza B by PCR NEGATIVE NEGATIVE Final    Comment: (NOTE) The Xpert Xpress SARS-CoV-2/FLU/RSV plus assay is intended as an aid in the diagnosis of influenza from Nasopharyngeal swab specimens and should not be used as a sole basis for treatment. Nasal washings and aspirates are unacceptable for Xpert Xpress SARS-CoV-2/FLU/RSV testing.  Fact Sheet for Patients: EntrepreneurPulse.com.au  Fact Sheet for Healthcare Providers: IncredibleEmployment.be  This test is not yet approved or cleared by the [place] FDA and has been authorized for detection and/or diagnosis of SARS-CoV-2 by FDA under an Emergency Use Authorization (EUA). This EUA will remain in effect (meaning this test can be used) for the duration of the COVID-19 declaration under Section 564(b)(1) of the Act, 21 U.S.C. section 360bbb-3(b)(1), unless the authorization is terminated or revoked.  Performed at [facility], [place] [address], [place], [place] [address]   Urine culture     Status: Abnormal   Collection Time: 04/21/21  9:18 PM   Specimen: Urine, Random  Result Value Ref Range Status   Specimen Description URINE, RANDOM  Final   Special Requests   Final    NONE Performed at [facility], [place] [address] [place], [place] [address]    Culture MULTIPLE SPECIES PRESENT, SUGGEST RECOLLECTION (A)  Final   Report Status 04/23/2021 FINAL  Final     Labs: BNP (last 3 results) Recent Labs    01/27/21 1247 01/29/21 2021 04/21/21 1613  BNP 16.4 13.4 95.0   Basic Metabolic Panel: Recent Labs  Lab 04/19/21 2112 04/21/21 1233 04/22/21 0054 04/23/21 0717 04/24/21 0351  NA  142 141 140 140 142  K 3.6 4.1 3.8 3.8 3.6  CL 106 107 105 107 107  CO2 26 23 28 27 26   GLUCOSE 87 228* 147* 93 136*  BUN 29* 21 22 26* 28*  CREATININE 1.75* 1.68* 1.83* 1.74* 1.66*  CALCIUM 8.7* 9.3 9.0 8.9 9.0   Liver Function Tests: Recent Labs  Lab 04/19/21 2112 04/22/21 0054  AST 29 21  ALT 33 29  ALKPHOS 77 77  BILITOT 0.4 0.6  PROT 6.5 6.1*  ALBUMIN 3.9 3.3*   No results for input(s): LIPASE, AMYLASE in the last 168 hours. No results for input(s): AMMONIA in the last 168 hours. CBC: Recent Labs  Lab 04/19/21 2112 04/21/21 1233 04/22/21 0054 04/23/21 0717  WBC 9.3 8.6 9.0 7.8  NEUTROABS 3.8  --   --   --   HGB 13.3 13.5 12.5* 12.5*  HCT 40.0 43.4 37.9* 36.9*  MCV 89.7 94.1 90.7 88.5  PLT 231 230 206 201   Cardiac Enzymes: Recent Labs  Lab 04/22/21 0054  CKTOTAL 115   BNP: Invalid input(s): POCBNP CBG: Recent Labs  Lab 04/23/21 1402 04/23/21 1609 04/23/21 2141 04/24/21 0835 04/24/21 1332  GLUCAP 84 149* 259* 89 90   D-Dimer No results for input(s): DDIMER in the last 72 hours. Hgb A1c Recent Labs    04/21/21 1617  HGBA1C 7.9*   Lipid Profile Recent Labs    04/23/21 0717  CHOL 110  HDL 23*  LDLCALC 53  TRIG 172*  CHOLHDL 4.8   Thyroid function studies No results for input(s): TSH, T4TOTAL, T3FREE, THYROIDAB in the last 72 hours.  Invalid input(s): FREET3 Anemia work up No results for input(s): VITAMINB12, FOLATE, FERRITIN, TIBC, IRON, RETICCTPCT in the last  72 hours. Urinalysis    Component Value Date/Time   COLORURINE YELLOW 04/21/2021 1610   APPEARANCEUR HAZY (A) 04/21/2021 1610   LABSPEC 1.018 04/21/2021 1610   PHURINE 6.0 04/21/2021 1610   GLUCOSEU 150 (A) 04/21/2021 1610   HGBUR MODERATE (A) 04/21/2021 1610   BILIRUBINUR NEGATIVE 04/21/2021 1610   KETONESUR NEGATIVE 04/21/2021 1610   PROTEINUR 100 (A) 04/21/2021 1610   UROBILINOGEN 0.2 04/08/2011 0732   NITRITE NEGATIVE 04/21/2021 1610   LEUKOCYTESUR NEGATIVE  04/21/2021 1610   Sepsis Labs Invalid input(s): PROCALCITONIN,  WBC,  LACTICIDVEN Microbiology Recent Results (from the past 240 hour(s))  Culture, blood (Routine X 2) w Reflex to ID Panel     Status: None (Preliminary result)   Collection Time: 04/19/21  8:50 PM   Specimen: BLOOD  Result Value Ref Range Status   Specimen Description   Final    BLOOD BLOOD LEFT FOREARM Performed at [facility], [address] [place], [place] [address]    Special Requests   Final    BOTTLES DRAWN AEROBIC AND ANAEROBIC Blood Culture adequate volume Performed at [facility], [address] [place], [place] [address]    Culture   Final    NO GROWTH 4 DAYS Performed at [facility], [place] [address] [place], [place] [address]    Report Status PENDING  Incomplete  Culture, blood (Routine X 2) w Reflex to ID Panel     Status: Abnormal   Collection Time: 04/19/21 11:20 PM   Specimen: BLOOD  Result Value Ref Range Status   Specimen Description   Final    BLOOD RIGHT HAND Performed at [facility], [address] [place], [place] [address]    Special Requests   Final    BOTTLES DRAWN AEROBIC AND ANAEROBIC Blood Culture adequate volume Performed at [facility], [address] [place], [place] [address]    Culture  Setup Time   Final    GRAM POSITIVE COCCI IN CLUSTERS AEROBIC BOTTLE ONLY CRITICAL RESULT CALLED TO, READ BACK BY AND VERIFIED WITH: Camie Patience RN @0911  04/21/21 EB    Culture (A)  Final    STAPHYLOCOCCUS HOMINIS THE SIGNIFICANCE OF ISOLATING THIS ORGANISM FROM A SINGLE SET OF BLOOD CULTURES WHEN MULTIPLE SETS ARE DRAWN IS UNCERTAIN. PLEASE NOTIFY THE MICROBIOLOGY DEPARTMENT WITHIN ONE WEEK IF SPECIATION AND SENSITIVITIES ARE REQUIRED. Performed at [facility], [place] [address], [place],  [address]    Report Status 04/23/2021 FINAL  Final  Blood Culture ID Panel (Reflexed)      Status: Abnormal   Collection Time: 04/19/21 11:20 PM  Result Value Ref Range Status   Enterococcus faecalis NOT DETECTED NOT DETECTED Final   Enterococcus Faecium NOT DETECTED NOT DETECTED Final   Listeria monocytogenes NOT DETECTED NOT DETECTED Final   Staphylococcus species DETECTED (A) NOT DETECTED Final    Comment: CRITICAL RESULT CALLED TO, READ BACK BY AND VERIFIED WITH: JERRI TEGRAM RN @0911  04/21/21 EB    Staphylococcus aureus (BCID) NOT DETECTED NOT DETECTED Final   Staphylococcus epidermidis NOT DETECTED NOT DETECTED Final   Staphylococcus lugdunensis NOT DETECTED NOT DETECTED Final   Streptococcus species NOT DETECTED NOT DETECTED Final   Streptococcus agalactiae NOT DETECTED NOT DETECTED Final   Streptococcus pneumoniae NOT DETECTED NOT DETECTED Final   Streptococcus pyogenes NOT DETECTED NOT DETECTED Final   A.calcoaceticus-baumannii NOT DETECTED NOT DETECTED Final   Bacteroides fragilis NOT DETECTED NOT DETECTED Final   Enterobacterales NOT DETECTED  NOT DETECTED Final   Enterobacter cloacae complex NOT DETECTED NOT DETECTED Final   Escherichia coli NOT DETECTED NOT DETECTED Final   Klebsiella aerogenes NOT DETECTED NOT DETECTED Final   Klebsiella oxytoca NOT DETECTED NOT DETECTED Final   Klebsiella pneumoniae NOT DETECTED NOT DETECTED Final   Proteus species NOT DETECTED NOT DETECTED Final   Salmonella species NOT DETECTED NOT DETECTED Final   Serratia marcescens NOT DETECTED NOT DETECTED Final   Haemophilus influenzae NOT DETECTED NOT DETECTED Final   Neisseria meningitidis NOT DETECTED NOT DETECTED Final   Pseudomonas aeruginosa NOT DETECTED NOT DETECTED Final   Stenotrophomonas maltophilia NOT DETECTED NOT DETECTED Final   Candida albicans NOT DETECTED NOT DETECTED Final   Candida auris NOT DETECTED NOT DETECTED Final   Candida glabrata NOT DETECTED NOT DETECTED Final   Candida krusei NOT DETECTED NOT DETECTED Final   Candida parapsilosis NOT DETECTED NOT DETECTED  Final   Candida tropicalis NOT DETECTED NOT DETECTED Final   Cryptococcus neoformans/gattii NOT DETECTED NOT DETECTED Final    Comment: Performed at [facility], [place] [address], [place], [place] [address]  Culture, blood (routine x 2)     Status: None (Preliminary result)   Collection Time: 04/21/21  1:17 PM   Specimen: BLOOD RIGHT FOREARM  Result Value Ref Range Status   Specimen Description BLOOD RIGHT FOREARM  Final   Special Requests   Final    BOTTLES DRAWN AEROBIC AND ANAEROBIC Blood Culture results may not be optimal due to an inadequate volume of blood received in culture bottles   Culture   Final    NO GROWTH 3 DAYS Performed at [facility], [place] [address] [place], [place] [address]    Report Status PENDING  Incomplete  Culture, blood (routine x 2)     Status: None (Preliminary result)   Collection Time: 04/21/21  1:22 PM   Specimen: BLOOD  Result Value Ref Range Status   Specimen Description BLOOD SITE NOT SPECIFIED  Final   Special Requests   Final    BOTTLES DRAWN AEROBIC AND ANAEROBIC Blood Culture results may not be optimal due to an inadequate volume of blood received in culture bottles   Culture   Final    NO GROWTH 3 DAYS Performed at [facility], [place] [address], [place], [place] [address]    Report Status PENDING  Incomplete  Resp Panel by RT-PCR (Flu A&B, Covid) Urine, Clean Catch     Status: None   Collection Time: 04/21/21  4:18 PM   Specimen: Urine, Clean Catch; Nasopharyngeal(NP) swabs in vial transport medium  Result Value Ref Range Status   SARS Coronavirus 2 by RT PCR NEGATIVE NEGATIVE Final    Comment: (NOTE) SARS-CoV-2 target nucleic acids are NOT DETECTED.  The SARS-CoV-2 RNA is generally detectable in upper respiratory specimens during the acute phase of infection. The lowest concentration of SARS-CoV-2 viral copies this assay can detect is 138 copies/mL. A negative result does not preclude SARS-Cov-2 infection and should not  be used as the sole basis for treatment or other patient management decisions. A negative result may occur with  improper specimen collection/handling, submission of specimen other than nasopharyngeal swab, presence of viral mutation(s) within the areas targeted by this assay, and inadequate number of viral copies(<138 copies/mL). A negative result must be combined with clinical observations, patient history, and epidemiological information. The expected result is Negative.  Fact Sheet for Patients:  EntrepreneurPulse.com.au  Fact Sheet for Healthcare  Providers:  IncredibleEmployment.be  This test is no t yet approved or cleared by the [place] and  has been authorized for detection and/or diagnosis of SARS-CoV-2 by FDA under an Emergency Use Authorization (EUA). This EUA will remain  in effect (meaning this test can be used) for the duration of the COVID-19 declaration under Section 564(b)(1) of the Act, 21 U.S.C.section 360bbb-3(b)(1), unless the authorization is terminated  or revoked sooner.       Influenza A by PCR NEGATIVE NEGATIVE Final   Influenza B by PCR NEGATIVE NEGATIVE Final    Comment: (NOTE) The Xpert Xpress SARS-CoV-2/FLU/RSV plus assay is intended as an aid in the diagnosis of influenza from Nasopharyngeal swab specimens and should not be used as a sole basis for treatment. Nasal washings and aspirates are unacceptable for Xpert Xpress SARS-CoV-2/FLU/RSV testing.  Fact Sheet for Patients: EntrepreneurPulse.com.au  Fact Sheet for Healthcare Providers: IncredibleEmployment.be  This test is not yet approved or cleared by the [place] FDA and has been authorized for detection and/or diagnosis of SARS-CoV-2 by FDA under an Emergency Use Authorization (EUA). This EUA will remain in effect (meaning this test can be used) for the duration of the COVID-19 declaration under Section  564(b)(1) of the Act, 21 U.S.C. section 360bbb-3(b)(1), unless the authorization is terminated or revoked.  Performed at [facility], [place] [address] [place], [place] [address]   Urine culture     Status: Abnormal   Collection Time: 04/21/21  9:18 PM   Specimen: Urine, Random  Result Value Ref Range Status   Specimen Description URINE, RANDOM  Final   Special Requests   Final    NONE Performed at [facility], [place] [address] [place], [place] [address]    Culture MULTIPLE SPECIES PRESENT, SUGGEST RECOLLECTION (A)  Final   Report Status 04/23/2021 FINAL  Final   Time spent: 30 min  SIGNED:   Marylu Lund, MD  [facility] 04/24/2021, 2:44 PM  If 7PM-7AM, please contact night-coverage

## 2021-04-24 NOTE — Progress Notes (Signed)
Occupational Therapy Treatment Patient Details Name: Joshua [name] MRN: [id number] DOB: 07-09-1951 Today's Date: 04/24/2021    History of present illness Ariv A Codner is a 70 y.o. male presenting to ED on 5/29 with persistent slurred speech and R-sided weakness. Patient taken to [facility] in [place] (5/25)where he was visiting his son at time of symptom onset with likely CVA. Blood cultures came back positive on 04/18/21. Patient admitted with acute staphylococcal bacteremia. MRI pending secondary to deep brain stimulator. PMHx significant for hypertrophic cardiomyopathy, HTN, DMII, CHF, OSA on CPAP, CKD stage IIIa, PE after knee surgery, essential tremor status post deep brain stimulator 2020, Hx of CVA.   OT comments  Patient declined all ADLs this date. Session with focus on RUE NMR with written handout. Patient completed 1 set x10 reps each of RUE HEP and 1 set x10 reps each of therapeutty HEP. Patient also encouraged to use R hand with all activities (forced use). All questions answered to patient satisfaction. Will focus next session on completion of ADLs and ADL transfers in prep for safe return home.    Follow Up Recommendations  No OT follow up (Patient declining outpatient neuro OT follow-up)    Equipment Recommendations  None recommended by OT    Recommendations for Other Services      Precautions / Restrictions Precautions Precautions: Fall Restrictions Weight Bearing Restrictions: No       Mobility Bed Mobility               General bed mobility comments: Patient declined    Transfers                      Balance                                           ADL either performed or assessed with clinical judgement   ADL                                         General ADL Comments: Patient declined all ADLs and OOB activity this date.     Vision       Perception     Praxis       Cognition Arousal/Alertness: Awake/alert Behavior During Therapy: WFL for tasks assessed/performed Overall Cognitive Status: Impaired/Different from baseline Area of Impairment: Safety/judgement                         Safety/Judgement: Decreased awareness of safety;Decreased awareness of deficits              Exercises Exercises: Hand activities Hand Exercises Digit Composite Flexion: AROM;Right;10 reps Composite Extension: AROM;10 reps;Right Digit Composite Abduction: AROM;10 reps;Right Digit Composite Adduction: AROM;10 reps;Right Digit Lifts: AROM;10 reps;Right Opposition: AROM;Right;10 reps   Shoulder Instructions       General Comments      Pertinent Vitals/ Pain       Pain Assessment: No/denies pain  Home Living                                          Prior Functioning/Environment  Frequency  Min 3X/week        Progress Toward Goals  OT Goals(current goals can now be found in the care plan section)  Progress towards OT goals: Progressing toward goals  Acute Rehab OT Goals Patient Stated Goal: To return home. OT Goal Formulation: With patient Time For Goal Achievement: 05/06/21 Potential to Achieve Goals: Good ADL Goals Pt/caregiver will Perform Home Exercise Program: Right Upper extremity;With theraputty;With written HEP provided Additional ADL Goal #1: Patient will complete ADLs with Mod I and AE/DME PRN.  Plan Discharge plan remains appropriate;Frequency remains appropriate    Co-evaluation                 AM-PAC OT "6 Clicks" Daily Activity     Outcome Measure   Help from another person eating meals?: None Help from another person taking care of personal grooming?: A Little Help from another person toileting, which includes using toliet, bedpan, or urinal?: A Little Help from another person bathing (including washing, rinsing, drying)?: A Little Help from another person to put on and  taking off regular upper body clothing?: A Little Help from another person to put on and taking off regular lower body clothing?: A Little 6 Click Score: 19    End of Session    OT Visit Diagnosis: Unsteadiness on feet (R26.81);Muscle weakness (generalized) (M62.81);Hemiplegia and hemiparesis Hemiplegia - Right/Left: Right Hemiplegia - dominant/non-dominant: Dominant Hemiplegia - caused by: Unspecified   Activity Tolerance Patient tolerated treatment well   Patient Left in bed;with call bell/phone within reach;with bed alarm set   Nurse Communication          Time: 4481-8563  OT Time Calculation (min): 21 min  Charges: OT General Charges $OT Visit: 1 Visit OT Treatments $Neuromuscular Re-education: 8-22 mins  Pallas Wahlert H. OTR/L Supplemental OT, Department of rehab services [phone]   Greogory Cornette R H. 04/24/2021, 9:57 AM

## 2021-04-24 NOTE — Progress Notes (Addendum)
STROKE TEAM PROGRESS NOTE   INTERVAL HISTORY Joshua [name] developed slurred speech and abnormal sensation of right hand while visiting his family in [place] on 5/25. He was initially evaluated at a local hospital. CT head showed no acute findings. MRI not performed due to remote for deep brain stimulator was back at home in [place]. When he returned home, he was called for 1 of 2 collections of blood cultures that were positive. He was directed to local ED on 5/27. When he presented to [facility] ED he had no signs of infection.  He continued to have decrease sensation in his right hand symptoms and slurred speech. CT head was without acute findings. Blood cultures were repeated and he was discharged. He was found to have Staph spp. growing in the most recent blood cultures, prompting return to ED at [facility].  Vitals:   04/23/21 2142 04/24/21 0500 04/24/21 0614 04/24/21 0900  BP:   134/83 135/77  Pulse:    70  Resp:    16  Temp: 98.2 F (36.8 C)  98 F (36.7 C) 97.9 F (36.6 C)  TempSrc: Oral  Oral Oral  SpO2:    100%  Weight:  115 kg    Height:       CBC:  Recent Labs  Lab 04/19/21 2112 04/21/21 1233 04/22/21 0054 04/23/21 0717  WBC 9.3   < > 9.0 7.8  NEUTROABS 3.8  --   --   --   HGB 13.3   < > 12.5* 12.5*  HCT 40.0   < > 37.9* 36.9*  MCV 89.7   < > 90.7 88.5  PLT 231   < > 206 201   < > = values in this interval not displayed.   Basic Metabolic Panel:  Recent Labs  Lab 04/23/21 0717 04/24/21 0351  NA 140 142  K 3.8 3.6  CL 107 107  CO2 27 26  GLUCOSE 93 136*  BUN 26* 28*  CREATININE 1.74* 1.66*  CALCIUM 8.9 9.0   Lipid Panel:  Recent Labs  Lab 04/23/21 0717  CHOL 110  TRIG 172*  HDL 23*  CHOLHDL 4.8  VLDL 34  LDLCALC 53   HgbA1c:  Recent Labs  Lab 04/21/21 1617  HGBA1C 7.9*   Urine Drug Screen: No results for input(s): LABOPIA, COCAINSCRNUR, LABBENZ, AMPHETMU, THCU, LABBARB in the last 168 hours.  Alcohol Level No results for input(s): ETH  in the last 168 hours.  IMAGING past 24 hours  CT HEAD  Result Date: 04/22/2021 IMPRESSION: CT HEAD IMPRESSION:  1. Stable head CT. No acute intracranial abnormality.  2. Atrophy with chronic small vessel ischemic disease with superimposed chronic right basal ganglia lacunar infarct.  3. Stable left DBS electrode.   CT ANGIO NECK W OR WO CONTRAST Result Date: 04/22/2021  IMPRESSION:  1. 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  2. Mild atheromatous change about the carotid bifurcations and carotid siphons without significant stenosis. No hemodynamically significant or correctable stenosis about the major arterial vasculature of the head and neck.  3. Distal small vessel atheromatous irregularity within the intracranial circulation, with moderate to severe bilateral P3 stenoses, right worse than left.  4. 4 mm right upper lobe pulmonary nodule, indeterminate. No follow-up needed if patient is low-risk. Non-contrast chest CT can be considered in 12 months if patient is high-risk.  Echo Result Date 03/12/2021 IMPRESSIONS  1. Although overall  left ventricular GLS is normal, the distribution of  strain abnormalities is strongly suggestive of cardiac amyloidosis  ("cherry on top" pattern). Left ventricular ejection fraction, by  estimation, is 55 to 60%. The left ventricle has  normal function. The left ventricle has no regional wall motion  abnormalities. There is moderate concentric left ventricular hypertrophy.  Left ventricular diastolic parameters are consistent with Grade I  diastolic dysfunction (impaired relaxation). The  average left ventricular global longitudinal strain is -19.1 %. The global  longitudinal strain is normal.  2. Right ventricular systolic function is normal. The right ventricular  size is normal.  3. The mitral valve is normal in  structure. No evidence of mitral valve  regurgitation.  4. The aortic valve is tricuspid. Aortic valve regurgitation is not  visualized. Mild to moderate aortic valve sclerosis/calcification is  present, without any evidence of aortic stenosis.  5. Aortic dilatation noted. There is moderate dilatation of the ascending  aorta, measuring 45 mm.   PHYSICAL EXAM GENERAL: Awake, alert, sitting up on side of bed in NAD Head: Normocephalic and atraumatic, dry mucous membranes EENT: Wears glasses at baseline, normal conjunctivae, no OP obstruction LUNGS: Normal respiratory effort, non-labored breathing, in no respiratory distress CV: Regular rate on telemetry, lower extremities without edema ABDOMEN: Soft, non-tender Ext: warm, without obvious deformity  NEURO:  Mental Status: Awake, alert, and oriented to person, place, time, age, month, year, location, and situation. Patient is able to provide a clear and coherent history of present illness.  Speech/Language: Speech is clear, slightly slowed. No word difficulty or errors.  Naming, repetition, and comprehension intact. n Cranial Nerves:  II: PERRL 3 mm/brisk. Visual fields full.  III, IV, VI: EOMI without ptosis.  V: Sensation is intact to light touch and symmetrical to face.  VII: Face is symmetric resting and smiling.   VIII: Hearing is intact to voice IX, X: Palate elevation is symmetric. Phonation normal.  XI: Normal sternocleidomastoid and trapezius muscle strength XII: Tongue protrudes midline without fasciculations  Motor: 5/5 strength is all muscle groups without pronator drift on assessment, right hand: Finger flexion 4-/5, finger extension 4+/5, finger abduction 4+/5, unable to maintain circle with thumb and index finger against resistance (4/5)  Tremor in bilateral upper extremities, left > right.  Tone and bulk are normal.  Sensation: Intact to light touch bilaterally in all four extremities.  Coordination: FTN intact on  left upper extremity, right upper extremity with some loss of coordinated movements. HKS intact bilaterally. No pronator drift.  Gait: deferred  ASSESSMENT/PLAN Joshua [name] is a 70 year old male with history of essential tremor s/p DBS, DM, CKD, PE/DVT, CVA who presented to the ED for evaluation of bacteremia on blood culture results from an OSH on 5/26.  The patient initially presented to an ED in [place] with complaints of slurred speech, gait disturbance, and right-sided weakness. CTH negative.   Subacute infarct of the left thalamic/BG region, likely small vessel disease.   CT head: no acute abnormality.   CTA head & neck: proximal left subclavian artery chronic adherent thrombus. moderate to severe bilateral P3 stenoses, right worse than left  MRI subacute small infarct of the left thalamic/BG region adjacent to DBS lead  2D Echo (4/19) EF: 55-60%, left ventricular strain suggestive of cardiac amyloidosis, mild LVH, Grade I diastolic dysfunction  TEE without vegetation, no endocarditis  LDL 99  HgbA1c 7.9  VTE prophylaxis - lovenox  Eliquis (apixaban) daily prior to admission, now on Eliquis. Continue Eliquis  and add aspirin 81 on top  Therapy recommendations:  Outpt SLP, no OT needs  Disposition:  Home  Plan for neurology follow up in 2 weeks with Dr. Carles Collet at [facility].  Prior Stroke  Hx stroke: October 2020 presented with severe dysarthria and slowing.  left hand and leg ataxia and left beating nystagmus on leftward gaze as well as rotary nystagmus on upward gaze.  All suggestive of a posterior fossa/cerebellar stroke which led to diagnosis of MRI negative posterior circulation stroke in addition to the acute right basal ganglia stroke revealed on imaging at the time.  Source of the posterior circulation stroke was felt to be a thrombus in the left subclavian, for which he was started on Eliquis.  Due to persistence of this thrombus on last imaging (December 21, 2019), he  was continued on Eliquis.  This was stopped due to a large pericardial effusion seen on echocardiogram on 01/2019.  He was started on colchicine for suspected pericarditis with resolution of the effusion on echocardiogram 03/12/2021, with resumption of his Eliquis on 03/26/2021 at outpatient cardiology follow-up.   Concern for bacteremia  Blood cx from Hospital in [place] showed staphylococcal species in 1 site  Blood cx (5/29) no growth x 2 days  Blood cx (5/27): staphlococcus hominis in 1 site and no growth from the other  TEE was negative for endocarditis   Daptomycin was started for MRSA coverage  Urine culture (5/29): multiple species present, need to recollect  ID consulted and felt blood cultures were contaminant. Recommended discontinuing antimicrobials.  Hypertension  Home meds:  Amlodipine 10mg , losartan 100mg  daily, lopressor 25mg  bid  Stable . Long-term BP goal normotensive  Hyperlipidemia  Home meds:  Atorvastatin 80, Zetia 10, fenofibrate 54mg  resumed in hospital  LDL 99, goal < 70  High intensity statin   Continue statin at discharge  Diabetes type II Controlled  Home meds:  glargine 80mg  bid  HgbA1c 7.9, goal < 7.0  CBGs  SSI  Chronic Kidney Disease, Stage IIIa   Creatinine 1.83->1.74->1.66  Avoid nephrotoxins  Other Stroke Risk Factors  Advanced Age >/= 4   Former smoker, quit 37 years ago.  He has a 10 pack-year smoking history  ETOH use, advised to drink no more than 1-2 drink(s) per week  Obesity, Body mass index is 30.86 kg/m., BMI >/= 30 associated with increased stroke risk, recommend weight loss, diet and exercise as appropriate   Hx stroke: October 2020   Coronary artery disease: hypertrophic cardiomyopathy, NSVT, PVCs  Obstructive sleep apnea, on CPAP at home  Other Active Problems  Depression: Takes lexapro 20  DM  DVT/Pulmonary emboli  GERD  Gout: takes colchicine   Essential tremors: has DBS  4 mm right  upper lobe pulmonary nodule incidental finding. Will need follow-up with PCP  Hospital day # 3  Delila A Bailey-Modzik, NP-C  ATTENDING NOTE: I reviewed above note and agree with the assessment and plan. Pt was seen and examined.   Wife at bedside, patient sitting up for lunch, he had MRI done today showed small left BG/thalamus subacute infarct, correlating to his symptoms.  ID on board concerning the positive blood culture was contamination and antibiotics discontinued.  2D echo showed no endocarditis.  Given his recent small vessel stroke in the setting of Eliquis use, recommend add aspirin 81 on top of Eliquis for further stroke prevention.  Continue stroke risk factor modification, continue statin home dose.  He will follow-up with Dr. Carles Collet in 2 weeks.  For detailed assessment and plan, please refer to above as I have made changes wherever appropriate.   Neurology will sign off. Please call with questions. Thanks for the consult.   [name], MD PhD Stroke Neurology 04/24/2021 8:35 PM     To contact Stroke Continuity provider, please refer to [email or web address] After hours, contact General Neurology

## 2021-04-24 NOTE — Care Management Important Message (Signed)
Important Message  Patient Details  Name: Joshua [name] MRN: [id number] Date of Birth: Mar 05, 1951   Medicare Important Message Given:  Yes     Orbie Pyo 04/24/2021, 3:01 PM

## 2021-04-25 ENCOUNTER — Telehealth: Payer: Self-pay | Admitting: Neurology

## 2021-04-25 ENCOUNTER — Telehealth: Payer: Self-pay

## 2021-04-25 ENCOUNTER — Ambulatory Visit: Payer: Medicare PPO | Admitting: Cardiology

## 2021-04-25 LAB — CULTURE, BLOOD (ROUTINE X 2)
Culture: NO GROWTH
Special Requests: ADEQUATE

## 2021-04-25 NOTE — Telephone Encounter (Signed)
Please call patient and do a TCM phone call today for hospital f/u stroke.  Put on my schedule 6/13 at 9:15, movement d/o slot, 60

## 2021-04-25 NOTE — Telephone Encounter (Signed)
How have you been since you were released from the hospital?-yes  Do you understand why you were in the hospital?-yes  Do you understanding the discharge instructions?-yes  Items reviewed: Medications:new medication Asa 81mg  1 tab po qd Allergies:Melatonin Dietary changes:No salt, low carb diet Referrals reviewed:Yes to [facility]  Functional questionare: ADLs (independent/dependent) -independent  Ambulation?-independent, slower pace  Bathing/hygiene?--Independent, slower pace  Feeding/Food prep?-Independent, slower pace  Continence?-Independent  Grooming/dressing?-Independent   Transportation Any patient concerns?no  Confirmed appointments and set up follow up with PCP:  Confirmed with patient if condition begins to worsen call PCP or go to the ER. Pt. Was given the office number and encouraged to call back with questions or concerns. Confirmed appt 05/07/2021 at 1:30 with Dr. Wells Guiles Tat

## 2021-04-25 NOTE — Telephone Encounter (Signed)
Patient confirmed and is scheduled for the 13th

## 2021-04-26 DIAGNOSIS — I422 Other hypertrophic cardiomyopathy: Secondary | ICD-10-CM | POA: Diagnosis not present

## 2021-04-26 DIAGNOSIS — I129 Hypertensive chronic kidney disease with stage 1 through stage 4 chronic kidney disease, or unspecified chronic kidney disease: Secondary | ICD-10-CM | POA: Diagnosis not present

## 2021-04-26 DIAGNOSIS — E1129 Type 2 diabetes mellitus with other diabetic kidney complication: Secondary | ICD-10-CM | POA: Diagnosis not present

## 2021-04-26 DIAGNOSIS — B957 Other staphylococcus as the cause of diseases classified elsewhere: Secondary | ICD-10-CM | POA: Diagnosis not present

## 2021-04-26 DIAGNOSIS — I6990 Unspecified sequelae of unspecified cerebrovascular disease: Secondary | ICD-10-CM | POA: Diagnosis not present

## 2021-04-26 DIAGNOSIS — E1121 Type 2 diabetes mellitus with diabetic nephropathy: Secondary | ICD-10-CM | POA: Diagnosis not present

## 2021-04-26 DIAGNOSIS — R7881 Bacteremia: Secondary | ICD-10-CM | POA: Diagnosis not present

## 2021-04-26 DIAGNOSIS — N1831 Chronic kidney disease, stage 3a: Secondary | ICD-10-CM | POA: Diagnosis not present

## 2021-04-26 LAB — CULTURE, BLOOD (ROUTINE X 2)
Culture: NO GROWTH
Culture: NO GROWTH

## 2021-04-30 ENCOUNTER — Ambulatory Visit (INDEPENDENT_AMBULATORY_CARE_PROVIDER_SITE_OTHER): Payer: Medicare PPO | Admitting: Cardiology

## 2021-04-30 ENCOUNTER — Other Ambulatory Visit: Payer: Self-pay

## 2021-04-30 VITALS — BP 124/86 | HR 67 | Ht 76.0 in | Wt 257.0 lb

## 2021-04-30 DIAGNOSIS — I639 Cerebral infarction, unspecified: Secondary | ICD-10-CM

## 2021-04-30 DIAGNOSIS — I422 Other hypertrophic cardiomyopathy: Secondary | ICD-10-CM

## 2021-04-30 DIAGNOSIS — E785 Hyperlipidemia, unspecified: Secondary | ICD-10-CM

## 2021-04-30 DIAGNOSIS — I10 Essential (primary) hypertension: Secondary | ICD-10-CM | POA: Diagnosis not present

## 2021-04-30 DIAGNOSIS — I313 Pericardial effusion (noninflammatory): Secondary | ICD-10-CM

## 2021-04-30 DIAGNOSIS — I3139 Other pericardial effusion (noninflammatory): Secondary | ICD-10-CM

## 2021-04-30 NOTE — Progress Notes (Signed)
Cardiology Office Note:    Date:  04/30/2021   ID:  Joshua [name], DOB 1951/04/11, MRN [id number]  PCP:  [name], MD  Cardiologist:  Donato Heinz, MD  Electrophysiologist:  None   Referring MD: [name], MD   Chief Complaint  Patient presents with  . Cardiomyopathy    History of Present Illness:     Joshua [name] is a 70 y.o. male with a hx of HCM, hypertension, diabetes, OSA, PE after knee surgery, essential tremor status post DBS June 2020, CVA who presents for follow-up.  Admitted to [facility] from 08/28/19 through 08/31/19 with an acute CVA.  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  MRI brain showed right internal capsule infarct.  Echocardiogram showed no cardiogenic source of embolism.  He was started on heparin given subclavian artery thrombosis, and this was transitioned to Eliquis.  It was unclear if the thrombosis was due to a cardiac source or developed in situ.  Stroke team recommended Eliquis for 2 months and repeating CTA neck; if CTA neck negative and no atrial fibrillation noted on 30-day monitor, stroke team recommended stopping Eliquis and resuming antiplatelet agent.  TTE was notable for an incidental finding of asymmetric basal septal hypertrophy.  He was referred to cardiology and seen on 09/19/19.  Cardiac MRI was ordered, which showed Basal septal hypertrophy measuring up to 103mm (lateral wall 59mm), consistent with hypertrophic cardiomyopathy.  PYP scan showed no evidence of amyloid.  Cardiac monitor showed no VT, occasional PVCs (1.3% of beats).   Repeat monitor on 08/22/2020 showed 1 4 beat run of NSVT, 6 runs of SVT longest lasting 16 beats, occasional PVCs (4%).  He denies any family history of HCM.  Does have vertigo.  No syncope except with CVA years ago.  He was admitted to [facility] from 3/8 through 02/02/2021 with shortness of breath and elevated effort for cardiac fusion.  Started on colchicine and Eliquis  was held.  No adequate window for pericardiocentesis.  Repeat echo 03/08/2021 showed pericardial effusion had resolved, LVEF 55 to 95%, grade 1 diastolic dysfunction, moderate LVH, strain abnormalities suggestive of cardiac amyloidosis.  Lexiscan Myoview on 02/12/2021 showed no evidence of ischemia, EF 53%.  Since last clinic visit, he was admitted to [facility] from 5/29 through 04/24/2021.  He developed slurred speech and abnormal right hand sensation while traveling in [place] on 5/21.  He was admitted to local hospital and head CT was done which was unremarkable.  Blood cultures were drawn and he was discharged.  He traveled back to [place] and received a call on 5/27 stating that blood cultures were positive and recommended going to the ED.  He was afebrile with no leukocytosis, so blood cultures were drawn in the ED and he was again discharged.  He was called back on 5/29 again for positive blood cultures and was admitted.  He was initially treated with antibiotics, but cultures resulted coag negative staph and per ID likely contaminant.  Antibiotics were discontinued.  He underwent a TEE on 5/31 which was negative for vegetation.  He was found to have left-sided CVA, neurology recommended adding aspirin 81 mg daily and continuing anticoagulation. Today, he is accompanied by his wife, who also provides some history. Leading up to his ED visit, he felt an "odd" sensation that is difficult to describe. Fifteen minutes later, his speech was unintelligible and they went to the ED. Currently, he confirms taking both the 61  mg aspirin and anticoagulation. He is also continuing with colchicine. At home his weight has remained stable, averaging 253 pounds. Of note, he reports drinking a large amount of liquid constantly. His wife reports he has occasional LE edema around his ankles. Also, he has persistent lightheadedness that he attributes to vertigo, and is not a new symptom. He denies any chest pain, shortness of  breath, palpitations, or exertional symptoms. No headaches, or syncope to report. Also has no orthopnea or PND.   Wt Readings from Last 3 Encounters:  04/30/21 257 lb (116.6 kg)  04/24/21 253 lb 8.5 oz (115 kg)  04/19/21 250 lb (113.4 kg)    Past Medical History:  Diagnosis Date  . Benign essential tremor   . Benign positional vertigo   . CVA (cerebral vascular accident) ([place])    x2 - L retina, 1 right parietal  . Degenerative arthritis   . Depression   . Diabetes mellitus   . DVT (deep venous thrombosis) ([place]) 2018  . Dyslipidemia   . GERD (gastroesophageal reflux disease)    hiatal hernia  . Gout   . H/O: vasectomy   . Hearing aid worn    b/l  . Hx of appendectomy   . Hx of tonsillectomy   . Hypertension   . Hypertrophic cardiomyopathy ([place])   . 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 Melanoma ([place])   . NSVT (nonsustained ventricular tachycardia) (H[facility])    1 4 beat run on event monitor in 07/2020  . Obesity   . OSA on CPAP    setting = 5  . Pulmonary emboli ([place]) 2018  . PVC's (premature ventricular contractions)   . SVT (supraventricular tachycardia) (H[facility])    by event monitor  . Tremor, essential 06/22/2017  . Wears glasses     Past Surgical History:  Procedure Laterality Date  . APPENDECTOMY    . arthroscopic knee surgery Bilateral   . CATARACT EXTRACTION Bilateral   . COLONOSCOPY    . MINOR PLACEMENT OF FIDUCIAL N/A 06/30/2019   Procedure: Fiducial placement;  Surgeon: Erline Levine, MD;  Location: [facility];  Service: Neurosurgery;  Laterality: N/A;  Fiducial placement  . NASAL SEPTUM SURGERY    . PULSE GENERATOR IMPLANT N/A 07/14/2019   Procedure: Left cranial Implanted Pulse Generator and lead extension placement to right chest ;  Surgeon: Erline Levine, MD;  Location: [facility];  Service: Neurosurgery;  Laterality: N/A;  . SUBTHALAMIC STIMULATOR INSERTION Left 07/07/2019   Procedure: LEFT DEEP BRAIN STIMULATOR PLACEMENT;  Surgeon: Erline Levine, MD;  Location:  [facility];  Service: Neurosurgery;  Laterality: Left;  . TEE WITHOUT CARDIOVERSION N/A 04/23/2021   Procedure: TRANSESOPHAGEAL ECHOCARDIOGRAM (TEE);  Surgeon: Acie Fredrickson Wonda Cheng, MD;  Location: [facility];  Service: Cardiovascular;  Laterality: N/A;  . TONSILLECTOMY    . TOTAL KNEE ARTHROPLASTY Left 03/30/2017   Procedure: LEFT TOTAL KNEE ARTHROPLASTY;  Surgeon: Paralee Cancel, MD;  Location: [facility];  Service: Orthopedics;  Laterality: Left;  . WISDOM TOOTH EXTRACTION      Current Medications: Current Meds  Medication Sig  . amLODipine (NORVASC) 10 MG tablet Take 1 tablet (10 mg total) by mouth at bedtime.  Marland Kitchen apixaban (ELIQUIS) 5 MG TABS tablet Take 1 tablet (5 mg total) by mouth 2 (two) times daily.  Marland Kitchen aspirin 81 MG EC tablet Take 1 tablet (81 mg total) by mouth daily. Swallow whole.  Marland Kitchen atorvastatin (LIPITOR) 80 MG tablet Take 1 tablet (80 mg total) by  mouth daily at 6 PM.  . DULoxetine (CYMBALTA) 60 MG capsule Take 60 mg by mouth daily.  Marland Kitchen escitalopram (LEXAPRO) 20 MG tablet Take 20 mg by mouth daily.  Marland Kitchen ezetimibe (ZETIA) 10 MG tablet Take 1 tablet (10 mg total) by mouth daily.  . fenofibrate 54 MG tablet Take 54 mg by mouth daily.  . furosemide (LASIX) 40 MG tablet Take 1 tablet (40 mg total) by mouth as needed (Weight increase of 3 pounds in 1 day or 5 pounds in 1 week.).  . Insulin Glargine, 1 Unit Dial, (TOUJEO SOLOSTAR) 300 UNIT/ML SOPN Inject 80 Units into the skin 2 (two) times daily.  . insulin lispro (HUMALOG) 100 UNIT/ML KwikPen Inject 15-25 Units into the skin See admin instructions. Inject 15 units subcutaneously after breakfast and 25 units after lunch and supper  . losartan (COZAAR) 100 MG tablet Take 1 tablet by mouth daily.  . Lutein-Zeaxanthin 25-5 MG CAPS Take 1 tablet by mouth daily.  . metFORMIN (GLUCOPHAGE) 500 MG tablet Take 500 mg by mouth daily with breakfast.  . metoprolol tartrate (LOPRESSOR) 25 MG tablet Take 1 tablet (25 mg total) by mouth 2 (two) times daily.  .  mirtazapine (REMERON) 7.5 MG tablet Take 7.5 mg by mouth at bedtime.  . Multiple Vitamin (MULTIVITAMIN WITH MINERALS) TABS tablet Take 1 tablet by mouth daily.  . Omega-3 Fatty Acids (FISH OIL) 1000 MG CAPS Take 2,000 mg by mouth daily. Reported on 02/13/2016  . ONETOUCH VERIO test strip   . Polyethyl Glycol-Propyl Glycol (SYSTANE OP) Place 1-2 drops into both eyes 4 (four) times daily as needed (dry eyes).   Marland Kitchen PRESCRIPTION MEDICATION Inhale into the lungs at bedtime. CPAP  . RESTASIS 0.05 % ophthalmic emulsion Place 1 drop into both eyes 2 (two) times daily as needed (dry eyes).  . SURE COMFORT PEN NEEDLES 31G X 5 MM MISC   . [DISCONTINUED] colchicine 0.6 MG tablet Take 1 tablet (0.6 mg total) by mouth 2 (two) times daily.     Allergies:   Melatonin   Social History   Socioeconomic History  . Marital status: Married    Spouse name: [name]  . Number of children: 2  . Years of education: Bachelors   . Highest education level: Bachelor's degree (e.g., BA, AB, BS)  Occupational History  . Occupation: retired    Fish farm manager: [employer]    Comment: teaching/coaching  Tobacco Use  . Smoking status: Former Smoker    Packs/day: 1.00    Years: 10.00    Pack years: 10.00    Types: Cigarettes    Quit date: 11/25/1983    Years since quitting: 37.4  . Smokeless tobacco: Never Used  Vaping Use  . Vaping Use: Never used  Substance and Sexual Activity  . Alcohol use: Yes    Alcohol/week: 2.0 standard drinks    Types: 2 Standard drinks or equivalent per week    Comment: occassionally  . Drug use: No  . Sexual activity: Not on file  Other Topics Concern  . Not on file  Social History Narrative   Patient lives at home with wife. [name](   Patient has 2 children that are in good health.    Patient works for [employer]. Retired .   Patient has a Bachelors degree in History.    Social Determinants of Health   Financial Resource Strain: Not on file  Food Insecurity:  Not on file  Transportation Needs: Not on file  Physical Activity:  Not on file  Stress: Not on file  Social Connections: Not on file     Family History: The patient's family history includes Alzheimer's disease in his father; Cerebral aneurysm in his mother; Diabetes in his maternal grandmother; Healthy in his son; Tremor in his brother, maternal uncle, and mother. There is no history of Colon cancer.  ROS:   Please see the history of present illness.    (+) Bilateral LE edema, ankles (+) Lightheadedness All other systems reviewed and are negative.  EKGs/Labs/Other Studies Reviewed:    The following studies were reviewed today:   EKG:   04/30/2021: sinus rhythm, first-degree AV block, rate 67, right axis deviation, Q waves in V1-3 03/26/2021: sinus rhythm, first-degree AV block, rate 92, left axis deviation, Q waves II, III, aVF, poor R wave progression  TEE 04/23/2021: 1. Left ventricular ejection fraction, by estimation, is 60 to 65%. The  left ventricle has normal function.  2. Right ventricular systolic function is normal. The right ventricular  size is normal.  3. No left atrial/left atrial appendage thrombus was detected.  4. Systolic anterior motion of the mitral valve leaflets is present . The  mitral valve is normal in structure. No evidence of mitral valve  regurgitation.  5. The aortic valve is normal in structure. Aortic valve regurgitation is  not visualized.   Echo 03/08/2021: 1. Although overall left ventricular GLS is normal, the distribution of  strain abnormalities is strongly suggestive of cardiac amyloidosis  ("cherry on top" pattern). Left ventricular ejection fraction, by  estimation, is 55 to 60%. The left ventricle has  normal function. The left ventricle has no regional wall motion  abnormalities. There is moderate concentric left ventricular hypertrophy.  Left ventricular diastolic parameters are consistent with Grade I  diastolic dysfunction  (impaired relaxation). The  average left ventricular global longitudinal strain is -19.1 %. The global  longitudinal strain is normal.  2. Right ventricular systolic function is normal. The right ventricular  size is normal.  3. The mitral valve is normal in structure. No evidence of mitral valve  regurgitation.  4. The aortic valve is tricuspid. Aortic valve regurgitation is not  visualized. Mild to moderate aortic valve sclerosis/calcification is  present, without any evidence of aortic stenosis.  5. Aortic dilatation noted. There is moderate dilatation of the ascending  aorta, measuring 45 mm.   Comparison(s): No significant change from prior study. Prior images  reviewed side by side. Pericardial effusion has resolved. Note GLS pattern  and marked LVH suggestive of cardiac amyloidosis, but also note normal  Tc42m-PYP scan. Consider evaluation for  light chain disease if not yet performed.  Lexiscan Myoview 02/12/2021:  Nuclear stress EF: 52%.  There was no ST segment deviation noted during stress.  This is a low risk study with no evidence of ischemia.  The left ventricular ejection fraction is mildly decreased (45-54%). Visually, systolic function appears normal.  Echo 01/30/2021: 1. Large pericardial effusion. The pericardial effusion is anterior to  the right ventricle. There is no evidence of increased pericardial  pressure. Echodensity within the effusion may suggest exudative nature.  2. Left ventricular ejection fraction, by estimation, is 55 to 60%. The  left ventricle has normal function. Left ventricular endocardial border  not optimally defined to evaluate regional wall motion. There is moderate  concentric left ventricular  hypertrophy. Left ventricular diastolic parameters are indeterminate.  3. Right ventricular systolic function was not well visualized. The right  ventricular size is not well visualized.  4. The mitral valve was not well visualized. No  evidence of mitral valve  regurgitation.  5. The aortic valve was not well visualized. Aortic valve regurgitation  is not visualized. No aortic stenosis is present.  6. Aortic dilatation noted. There is mild dilatation of the aortic root,  measuring 40 mm.  7. The inferior vena cava is normal in size with <50% respiratory  variability, suggesting right atrial pressure of 8 mmHg.   Comparison(s): A prior study was performed on 08/29/2019. Prior images  reviewed side by side. Difficult comparsion- this study is much more  technically difficult. Significant increase in pericardial effusion.  Primary cardiology team made aware.  8 day Zio Monitor 08/22/2020:  One 4 beat run of NSVT  6 runs of SVT, longest lasting 16 beats  Occasional PVCs (3.9%) 8 days of data recorded on Zio monitor. Patient had a min HR of 62 bpm, max HR of 162 bpm, and avg HR of 86 bpm. Predominant underlying rhythm was Sinus Rhythm. No atrial fibrillation, high degree block, or pauses noted. One 4 beat run of NSVT.  6 runs of SVT, longest lasting 16 beats.  Isolated atrial ectopy was rare (<1%).  Isolated ventricular ectopy was occasional (3.9%).  There were 0 triggered events.    Cardiac MRI 10/07/19: 1. Limited study, as only a few sequences were able to be completed due to limitations from presence of deep brain stimulator 2. Basal septal hypertrophy measuring up to 54mm (lateral wall 12 mm), consistent with hypertrophic cardiomyopathy 3. Patchy late gadolinium enhancement in basal septum, consistent with HCM 4. Basal inferolateral midwall LGE, which would not be a typical pattern for HCM, as more commonly seen in setting of prior myocarditis or sarcoidosis. 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  Normal LV size with hyperdynamic systolic function (EF 73%) 6.  Normal RV size and systolic function (EF 53%)  TTE 08/29/19: 1. Technically difficult study. Left  ventricular ejection fraction appears grossly normal, approximately 55-60%, though difficult visualization even with contrast 2. There is asymmetric basal septal hypertrophy measuring 18 mm in basal septum (12 mm posterior wall). Consider cardiac MRI to assess for hypertrophic cardiomyopathy if clinically indicated 3. Definity contrast agent was given IV to delineate the left ventricular endocardial borders. 4. Global right ventricle has normal systolic function.The right ventricular size is normal. No increase in right ventricular wall thickness. 5. There is mild dilatation of the aortic root measuring 39 mm. 6. The inferior vena cava is dilated in size with <50% respiratory variability, suggesting right atrial pressure of 15 mmHg.  PYP scan 10/25/19:  The study is normal.  No evidence of TTR amyloidosis.   Cardiac monitor 11/11/19:  No significant abnormalities  No atrial fibrillation. No VT  Occasional PVCs (1.3% of beats).  1 patient triggered event, which appears to correspond to short pause (1.1 seconds) from a blocked PA   Predominant rhythm is sinus rhythm. Range is 57 to 137 bpm with average of 89 bpm. No atrial fibrillation, sustained ventricular tachycardia, significant pause, or high degree AV block. Occasional PVCs (1.3% of beats). 1 patient triggered events. Triggered event appears to correspond to short pause (1.1 seconds) from a blocked PAC.  No significant abnormalities.   Recent Labs: 01/30/2021: TSH 1.421 03/26/2021: Magnesium 2.0 04/21/2021: B Natriuretic Peptide 13.0 04/22/2021: ALT 29 04/23/2021: Hemoglobin 12.5; Platelets 201 04/24/2021: BUN 28; Creatinine, Ser 1.66; Potassium 3.6; Sodium 142  Recent Lipid Panel    Component Value  Date/Time   CHOL 110 04/23/2021 0717   CHOL 161 10/24/2020 0943   TRIG 172 (H) 04/23/2021 0717   HDL 23 (L) 04/23/2021 0717   HDL 30 (L) 10/24/2020 0943   CHOLHDL 4.8 04/23/2021 0717   VLDL 34 04/23/2021 0717   LDLCALC 53  04/23/2021 0717   LDLCALC 99 10/24/2020 0943    Physical Exam:    VS:  BP 124/86   Pulse 67   Ht 6\' 4"  (1.93 m)   Wt 257 lb (116.6 kg)   SpO2 95%   BMI 31.28 kg/m     Wt Readings from Last 3 Encounters:  04/30/21 257 lb (116.6 kg)  04/24/21 253 lb 8.5 oz (115 kg)  04/19/21 250 lb (113.4 kg)     GEN: Well nourished, well developed in no acute distress HEENT: Normal NECK: No JVD CARDIAC: RRR, no murmurs, rubs, gallops RESPIRATORY:  Clear to auscultation without rales, wheezing or rhonchi  ABDOMEN: Soft, non-tender, non-distended MUSCULOSKELETAL:  No edema; No deformity  SKIN: Warm and dry NEUROLOGIC:  Alert and oriented x 3 PSYCHIATRIC:  Normal affect   ASSESSMENT:    1. Hypertrophic cardiomyopathy ([place])   2. Pericardial effusion   3. Cerebrovascular accident (CVA), unspecified mechanism ([place])   4. Essential hypertension   5. Hyperlipidemia, unspecified hyperlipidemia type    PLAN:    Hypertrophic cardiomyopathy: 8mm (lateral wall 12 mm) on CMR.  In addition, had unusual scar pattern for HCM, with basal inferolateral scar on MRI.  Fabry's disease can be associated with this scar pattern and asymmetric hypertrophy, so alpha galactosidase was checked and was normal. Scar pattern not c/w amyloid on CMR, but unfortunately unable to do T1 mapping to r/o amyloid due to DBS.  No evidence of amyloid on PYP scan in 2020.  Scar pattern may represent prior episode of myocarditis.  Suspect HCM.  No obstruction.  Recommended screening of first-degree relatives with TTE, he reports that he has discussed with his sons.  Cardiac monitor on 08/22/2020 showed one 4 beat run of NSVT, 6 runs of SVT longest lasting 16 beats, occasional PVCs (4%).  -SPEP/UPEP/light chains do not show evidence of AL amyloid.  PYP scan negative in 2020, will repeat given high clinical concern for amyloid -Continue metoprolol 25 mg twice daily -Favor avoiding scheduled diuretics given suspected HCM.  Continue Lasix  to as needed.  Asked to monitor daily weights and take if gains more than 3 pounds in 1 day or 5 pounds in 1 week  Pericardial effusion: Large effusion noted on echocardiogram 01/2021.  Started on colchicine for suspected pericarditis.  Repeat echocardiogram 03/12/2021 shows resolution of effusion -Restarted Eliquis given effusion resolved -Completed 5-month course of colchicine, can discontinue  Dyspnea: Reports dyspnea with minimal exertion.  Lexiscan Myoview with no evidence of ischemia  CVA: Subclavian artery thrombosis diagnosed, unclear if cardioembolic source or developed in situ.    No AF on cardiac monitor.  Continue Eliquis 5 mg twice daily and aspirin 81 mg daily added per neurology during recent admission.  Hypertension: On amlodipine 5 mg daily,  losartan 100 mg daily.  Appears controlled  Hyperlipidemia: On atorvastatin 80 mg daily.  LDL 99 on 10/24/2020.  Calcium score 588 (68th percentile).  Zetia 10 mg daily was added  Type 2 diabetes: A1c 8.9% on 01/30/2021.  On insulin  PVCs: occasional (1.3%) on monitor.  He is asymptomatic and with normal LV systolic function, no treatment indicated   RTC in 2 months.    Medication  Adjustments/Labs and Tests Ordered: Current medicines are reviewed at length with the patient today.  Concerns regarding medicines are outlined above.  Orders Placed This Encounter  Procedures  . MYOCARDIAL AMYLOID IMAGING PLANAR AND SPECT  . EKG 12-Lead   No orders of the defined types were placed in this encounter.   Patient Instructions  Medication Instructions:  STOP colchicine Take furosemide (Lasix) as needed for weight increase of 3 lbs overnight or 5 lbs in 1 week  *If you need a refill on your cardiac medications before your next appointment, please call your pharmacy*   Testing/Procedures: PYP nuclear imaging  Follow-Up: At [facility], you and your health needs are our priority.  As part of our continuing mission to provide you  with exceptional heart care, we have created designated Provider Care Teams.  These Care Teams include your primary Cardiologist (physician) and Advanced Practice Providers (APPs -  Physician Assistants and Nurse Practitioners) who all work together to provide you with the care you need, when you need it.  We recommend signing up for the patient portal called "MyChart".  Sign up information is provided on this After Visit Summary.  MyChart is used to connect with patients for Virtual Visits (Telemedicine).  Patients are able to view lab/test results, encounter notes, upcoming appointments, etc.  Non-urgent messages can be sent to your provider as well.   To learn more about what you can do with MyChart, go to [email or web address].    Your next appointment:   8/8 at 11:40 AM with Dr. Bruna Potter Ringling as a scribe for Donato Heinz, MD.,have documented all relevant documentation on the behalf of Donato Heinz, MD,as directed by  Donato Heinz, MD while in the presence of Donato Heinz, MD.  I, Donato Heinz, MD, have reviewed all documentation for this visit. The documentation on 04/30/21 for the exam, diagnosis, procedures, and orders are all accurate and complete.   Signed, Donato Heinz, MD  04/30/2021 11:31 PM    [place]

## 2021-04-30 NOTE — Progress Notes (Signed)
Assessment/Plan:   1.  Essential Tremor.  -The patient is status post left VIM DBS on July 07, 2019 with [name] device.  Intention was originally to do a bilateral VIM DBS, but the patient had a vagal episode in the operating room with associated nausea and vomiting with positive CSF, and it was decided not to attempt the other side.  Ultimately, the patients other side really has not been bothersome enough to consider DBS on that side (in addition to the fact that he is now on Eliquis).  He does have tremor on the left.   2.  History of cerebral infarct, October, 2020 and May, 2022  -MRI in October, 2020 demonstrated acute right posterior limb of the internal capsule infarct. However, stroke neurology felt that symptoms were more consistent with a posterior circulation stroke, related to thrombus in the left subclavian(although patient certainly does have mild left facial droop today).   -MRI in 2022 with small infarct in the thalamus on the left.  TEE was negative.  Patient was already on Eliquis (had just been restarted a few weeks before, as had been stopped for pericarditis).  -On atorvastatin, 80 mg, Zetia 10 mg.  LDL slightly above goal at 99 (goal less than 70)  -Patient's current hemoglobin A1c of 7.9, goal less than 7.0  -Patient currently on amlodipine 10, losartan 100 mg, Lopressor 25 mg twice per day -long-term blood pressure goal normotensive  -now on baby ASA in addition to eliquis.  Discussed warning signs and to go to ER if stools black/bloody if he falls/hits head  -dr. Virgina Jock put in consults for PT/OT/ST per patient.  Pt has not gotten call so I will put that in again.  He is using cane.    -Driving evaluation requested at [facility].  I think that the patient will be okay for that, but we will see what the evaluation shows.  3.  Dysphagia  -Modified barium on December 06, 2019 was normal but a little worse again since stroke.  Will recheck that.    4.  Hypertrophic  cardiomyopathy  -Following with cardiology.  Currently off of Eliquis because of recent pericardial effusion.  Effusion was not large enough to drain at the time.  Just saw cardiology   Subjective:   Joshua [name] was seen in follow-up in the movement disorder clinic for hospital follow-up/TCM.  Transition care management phone call done immediately after hospitalization.  Patient was apparently at a hospital in [place] prior to being admitted to our hospital.  Patient apparently had been traveling and developed slurred speech and a strange feeling in the right hand and was admitted for possible stroke on May 25, but they were unable to do the MRI because they did not know how to do it with his DBS in place.  I discharged the patient on on the way back but got a call from the hospital stating that he had a positive blood culture and needed to go to the hospital.  He went to droppage and had those cultures drawn and was sent home, but then got a call back from our hospital that the blood cultures were positive and was sent back to the hospital on May 29, at which point he was admitted.  He was seen by the stroke team.  I reviewed records, including those of Dr. Erlinda Hong.  [facility] contacted me and I helped them to get MRI completed.  There was a subacute small infarct in the left  thalamus adjacent to the DBS lead.  He had had an echocardiogram on April 19 demonstrating an ejection fraction of 55 to 60% with evidence of left ventricular strain suggestive of cardiac amyloidosis, mild LVH and grade 1 diastolic dysfunction.  TEE without evidence of vegetation and no endocarditis.  His LDL was 99.  Hemoglobin A1c 7.9.  Patient was already on Eliquis (although it had been stopped because of suspected pericarditis and was on colchicine, but the Eliquis has been resumed on Mar 26, 2021).  ID was consulted and felt that the blood cultures were contaminant and recommended discontinuing antimicrobials. ASA was added.  Saw  cardiology yesterday.    Reports that speech is 95% better than initially when sx's started.  Still slower and loss of dexterity in the R hand but it is getting better.  He is working with putty.  OT, PT put in by Dr. Virgina Jock and [place] but hasn't started yet.  Getting choked some.  States that Dr. Virgina Jock told him no driving until he saw me.    Allergies  Allergen Reactions  . Melatonin Other (See Comments)    dizzy    Current Outpatient Medications  Medication Instructions  . amLODipine (NORVASC) 10 mg, Oral, Daily at bedtime  . Aspirin Low Dose 81 mg, Oral, Daily, Swallow whole.  Marland Kitchen atorvastatin (LIPITOR) 80 mg, Oral, Daily-1800  . DULoxetine (CYMBALTA) 60 mg, Oral, Daily  . Eliquis 5 mg, Oral, 2 times daily  . escitalopram (LEXAPRO) 20 mg, Oral, Daily  . ezetimibe (ZETIA) 10 mg, Oral, Daily  . fenofibrate 54 mg, Oral, Daily  . Fish Oil 2,000 mg, Oral, Daily, Reported on 02/13/2016  . furosemide (LASIX) 40 mg, Oral, As needed  . insulin lispro (HUMALOG) 15-25 Units, Subcutaneous, See admin instructions, Inject 15 units subcutaneously after breakfast and 25 units after lunch and supper   . losartan (COZAAR) 100 MG tablet 1 tablet, Oral, Daily  . Lutein-Zeaxanthin 25-5 MG CAPS 1 tablet, Oral, Daily  . metFORMIN (GLUCOPHAGE) 500 mg, Oral, Daily with breakfast  . metoprolol tartrate (LOPRESSOR) 25 mg, Oral, 2 times daily  . mirtazapine (REMERON) 7.5 mg, Oral, Daily at bedtime  . Multiple Vitamin (MULTIVITAMIN WITH MINERALS) TABS tablet 1 tablet, Oral, Daily  . ONETOUCH VERIO test strip No dose, route, or frequency recorded.  Vladimir Faster Glycol-Propyl Glycol (SYSTANE OP) 1-2 drops, Both Eyes, 4 times daily PRN  . PRESCRIPTION MEDICATION Inhalation, Daily at bedtime, CPAP   . RESTASIS 0.05 % ophthalmic emulsion 1 drop, Both Eyes, 2 times daily PRN  . SURE COMFORT PEN NEEDLES 31G X 5 MM MISC No dose, route, or frequency recorded.  Nelva Nay SoloStar 80 Units, Subcutaneous, 2 times daily      Objective:   VITALS:   Vitals:   05/01/21 0947  BP: 116/64  Pulse: 70  SpO2: 98%  Weight: 258 lb (117 kg)  Height: 6\' 4"  (1.93 m)   Gen:  Appears stated age and in NAD. HEENT:  Normocephalic, atraumatic. The mucous membranes are moist.  Cardiovascular: Regular rate rhythm Lungs: Clear Neck: No bruits   NEUROLOGICAL:  Orientation:  The patient is alert and oriented x 3.   Cranial nerves: There is good facial symmetry. Extraocular muscles are intact and visual fields are full to confrontational testing. Speech is mildly dysphasic but no dysarthria.  It is better the longer he talks.  Soft palate rises symmetrically and there is no tongue deviation. Hearing is intact to conversational tone. Tone: Tone is good throughout.  Motor:  5/5 in the UE/LE Sensation:  No extinction with DSS.  No astereognosis.   Gait and Station: Patient pushes off of the chair to arise.  He is wide based and slightly drags the right leg.  Off balance in the turn.  MOVEMENT EXAM: Tremor: , there is mild intention tremor on the left (no DBS on that side).  There is no postural tremor on the right.    I have reviewed and interpreted the following labs independently   Chemistry      Component Value Date/Time   NA 142 04/24/2021 0351   NA 141 03/26/2021 1632   K 3.6 04/24/2021 0351   CL 107 04/24/2021 0351   CO2 26 04/24/2021 0351   BUN 28 (H) 04/24/2021 0351   BUN 32 (H) 03/26/2021 1632   CREATININE 1.66 (H) 04/24/2021 0351      Component Value Date/Time   CALCIUM 9.0 04/24/2021 0351   ALKPHOS 77 04/22/2021 0054   AST 21 04/22/2021 0054   ALT 29 04/22/2021 0054   BILITOT 0.6 04/22/2021 0054   BILITOT 0.4 10/24/2020 0943      Lab Results  Component Value Date   WBC 7.8 04/23/2021   HGB 12.5 (L) 04/23/2021   HCT 36.9 (L) 04/23/2021   MCV 88.5 04/23/2021   PLT 201 04/23/2021   Lab Results  Component Value Date   TSH 1.421 01/30/2021      Total time spent on today's visit was 70  minutes, including both face-to-face time and nonface-to-face time.  Time included that spent on review of records (prior notes available to me/labs/imaging if pertinent), discussing treatment and goals, answering patient's questions and coordinating care.    CC:  [name], MD

## 2021-04-30 NOTE — Progress Notes (Deleted)
Wt Readings from Last 3 Encounters:  04/30/21 257 lb (116.6 kg)  04/24/21 253 lb 8.5 oz (115 kg)  04/19/21 250 lb (113.4 kg)

## 2021-04-30 NOTE — Patient Instructions (Addendum)
Medication Instructions:  STOP colchicine Take furosemide (Lasix) as needed for weight increase of 3 lbs overnight or 5 lbs in 1 week  *If you need a refill on your cardiac medications before your next appointment, please call your pharmacy*   Testing/Procedures: PYP nuclear imaging  Follow-Up: At [facility], you and your health needs are our priority.  As part of our continuing mission to provide you with exceptional heart care, we have created designated Provider Care Teams.  These Care Teams include your primary Cardiologist (physician) and Advanced Practice Providers (APPs -  Physician Assistants and Nurse Practitioners) who all work together to provide you with the care you need, when you need it.  We recommend signing up for the patient portal called "MyChart".  Sign up information is provided on this After Visit Summary.  MyChart is used to connect with patients for Virtual Visits (Telemedicine).  Patients are able to view lab/test results, encounter notes, upcoming appointments, etc.  Non-urgent messages can be sent to your provider as well.   To learn more about what you can do with MyChart, go to [email or web address].    Your next appointment:   8/8 at 11:40 AM with Dr. Gardiner Rhyme

## 2021-04-30 NOTE — Progress Notes (Deleted)
TRANSITION CARE MANAGEMENT [phone]) Assessment/Plan:   1.  Essential Tremor.  -The patient is status post left VIM DBS on July 07, 2019 with [facility] device.  Intention was originally to do a bilateral VIM DBS, but the patient had a vagal episode in the operating room with associated nausea and vomiting with positive CSF, and it was decided not to attempt the other side.  Ultimately, the patients other side really has been fairly well controlled and we have decided to leave him with unilateral DBS.  -Patient did better following programming today.  He still has some proximal tremor, but not nearly as severe.  We did turn the device off, and tremor was very severe with the device off.  2.  History of cerebral infarct, October, 2020 and May, 2022  -MRI in October, 2020 demonstrated acute right posterior limb of the internal capsule infarct. However, stroke neurology felt that symptoms were more consistent with a posterior circulation stroke, related to thrombus in the left subclavian(although patient certainly does have mild left facial droop today).   -MRI in 2022 with small infarct in the thalamus on the left.  TEE was negative.  Patient was already on Eliquis (had just been restarted a few weeks before, as had been stopped for pericarditis).  -On atorvastatin, 80 mg, Zetia 10 mg.  LDL slightly above goal at 99 (goal less than 70)  -Patient's current hemoglobin A1c of 7.9, goal less than 7.0  -Patient currently on amlodipine 10, losartan 100 mg, Lopressor 25 mg twice per day -long-term blood pressure goal normotensive  3.  Dysphagia  -Modified barium on December 06, 2019 was normal.  4.  Hypertrophic cardiomyopathy  -Following with cardiology.  Currently off of Eliquis because of recent pericardial effusion.  Effusion was not large enough to drain at the time.  He has an appointment with cardiology later today.   Subjective:   Joshua [name] was seen in follow-up in the movement  disorder clinic for hospital follow-up/TCM.  Transition care management phone call done immediately after hospitalization.  Patient was apparently at a hospital in [place] prior to being admitted to our hospital.  Patient apparently had been traveling and developed slurred speech and a strange feeling in the right hand and was admitted for possible stroke on May 25, but they were unable to do the MRI because they did not know how to do it with his DBS in place.  I discharged the patient on on the way back but got a call from the hospital stating that he had a positive blood culture and needed to go to the hospital.  He went to droppage and had those cultures drawn and was sent home, but then got a call back from our hospital that the blood cultures were positive and was sent back to the hospital on May 29, at which point he was admitted.  He was seen by the stroke team.  I reviewed records, including those of Dr. Erlinda Hong.  [facility] contacted me and I helped them to get MRI completed.  There was a subacute small infarct in the left thalamus adjacent to the DBS lead.  He had had an echocardiogram on April 19 demonstrating an ejection fraction of 55 to 60% with evidence of left ventricular strain suggestive of cardiac amyloidosis, mild LVH and grade 1 diastolic dysfunction.  TEE without evidence of vegetation and no endocarditis.  His LDL was 99.  Hemoglobin A1c 7.9.  Patient was already on Eliquis (although it  had been stopped because of suspected pericarditis and was on colchicine, but the Eliquis has been resumed on Mar 26, 2021).  ID was consulted and felt that the blood cultures were contaminant and recommended discontinuing antimicrobials.  Allergies  Allergen Reactions  . Melatonin Other (See Comments)    dizzy    Current Outpatient Medications  Medication Instructions  . amLODipine (NORVASC) 10 mg, Oral, Daily at bedtime  . Aspirin Low Dose 81 mg, Oral, Daily, Swallow whole.  Marland Kitchen atorvastatin (LIPITOR)  80 mg, Oral, Daily-1800  . colchicine 0.6 mg, Oral, 2 times daily  . DULoxetine (CYMBALTA) 60 mg, Oral, Daily  . Eliquis 5 mg, Oral, 2 times daily  . escitalopram (LEXAPRO) 20 mg, Oral, Daily  . ezetimibe (ZETIA) 10 mg, Oral, Daily  . fenofibrate 54 mg, Oral, Daily  . Fish Oil 2,000 mg, Oral, Daily, Reported on 02/13/2016  . furosemide (LASIX) 40 mg, Oral, As needed  . insulin lispro (HUMALOG) 15-25 Units, Subcutaneous, See admin instructions, Inject 15 units subcutaneously after breakfast and 25 units after lunch and supper   . losartan (COZAAR) 100 MG tablet 1 tablet, Oral, Daily  . Lutein-Zeaxanthin 25-5 MG CAPS 1 tablet, Oral, Daily  . metFORMIN (GLUCOPHAGE) 500 mg, Oral, Daily with breakfast  . metoprolol tartrate (LOPRESSOR) 25 mg, Oral, 2 times daily  . mirtazapine (REMERON) 7.5 mg, Oral, Daily at bedtime  . Multiple Vitamin (MULTIVITAMIN WITH MINERALS) TABS tablet 1 tablet, Oral, Daily  . ONETOUCH VERIO test strip No dose, route, or frequency recorded.  Vladimir Faster Glycol-Propyl Glycol (SYSTANE OP) 1-2 drops, Both Eyes, 4 times daily PRN  . PRESCRIPTION MEDICATION Inhalation, Daily at bedtime, CPAP   . RESTASIS 0.05 % ophthalmic emulsion 1 drop, Both Eyes, 2 times daily PRN  . SURE COMFORT PEN NEEDLES 31G X 5 MM MISC No dose, route, or frequency recorded.  Nelva Nay SoloStar 80 Units, Subcutaneous, 2 times daily     Objective:   VITALS:   There were no vitals filed for this visit. Gen:  Appears stated age and in NAD. HEENT:  Normocephalic, atraumatic. The mucous membranes are moist.    NEUROLOGICAL:  Orientation:  The patient is alert and oriented x 3.   Cranial nerves: There is good facial symmetry. Extraocular muscles are intact and visual fields are full to confrontational testing. Speech is fluent and clear today. Soft palate rises symmetrically and there is no tongue deviation. Hearing is intact to conversational tone. Tone: Tone is good throughout. Gait and Station:  The patient is somewhat off balance  MOVEMENT EXAM: Tremor: Prior to programming, there is mild postural tremor on the left (no DBS on that side).  There is no postural tremor on the right.  However, when the right arm is held in the proximal (wing beating) physician, he does have moderate tremor on the right prior to programming.  This is much better following programming, although not completely resolved.  He is able to draw Archimedes spirals and write his name fairly well post programming.  I have reviewed and interpreted the following labs independently   Chemistry      Component Value Date/Time   NA 142 04/24/2021 0351   NA 141 03/26/2021 1632   K 3.6 04/24/2021 0351   CL 107 04/24/2021 0351   CO2 26 04/24/2021 0351   BUN 28 (H) 04/24/2021 0351   BUN 32 (H) 03/26/2021 1632   CREATININE 1.66 (H) 04/24/2021 0351      Component Value Date/Time  CALCIUM 9.0 04/24/2021 0351   ALKPHOS 77 04/22/2021 0054   AST 21 04/22/2021 0054   ALT 29 04/22/2021 0054   BILITOT 0.6 04/22/2021 0054   BILITOT 0.4 10/24/2020 0943      Lab Results  Component Value Date   WBC 7.8 04/23/2021   HGB 12.5 (L) 04/23/2021   HCT 36.9 (L) 04/23/2021   MCV 88.5 04/23/2021   PLT 201 04/23/2021   Lab Results  Component Value Date   TSH 1.421 01/30/2021      Total time spent on today's visit was *** minutes, including both face-to-face time and nonface-to-face time.  Time included that spent on review of records (prior notes available to me/labs/imaging if pertinent), discussing treatment and goals, answering patient's questions and coordinating care.  This did not include DBS time, which is described in more detail on separate programming procedural note.  CC:  [name], MD

## 2021-05-01 ENCOUNTER — Encounter: Payer: Self-pay | Admitting: Neurology

## 2021-05-01 ENCOUNTER — Other Ambulatory Visit: Payer: Self-pay

## 2021-05-01 ENCOUNTER — Ambulatory Visit: Payer: Medicare PPO | Admitting: Neurology

## 2021-05-01 VITALS — BP 116/64 | HR 70 | Ht 76.0 in | Wt 258.0 lb

## 2021-05-01 DIAGNOSIS — E785 Hyperlipidemia, unspecified: Secondary | ICD-10-CM | POA: Diagnosis not present

## 2021-05-01 DIAGNOSIS — G250 Essential tremor: Secondary | ICD-10-CM

## 2021-05-01 DIAGNOSIS — R1310 Dysphagia, unspecified: Secondary | ICD-10-CM

## 2021-05-01 DIAGNOSIS — I6330 Cerebral infarction due to thrombosis of unspecified cerebral artery: Secondary | ICD-10-CM | POA: Diagnosis not present

## 2021-05-01 NOTE — Patient Instructions (Addendum)
1.  You have been referred to [facility] for therapy. They will call you directly to schedule an appointment.  Please call [phone] if you do not hear from them.   2.  I have faxed referral to [facility] Driving Evaluation. They will call you directly to schedule an appt. They can be reached directly at [phone].

## 2021-05-02 ENCOUNTER — Ambulatory Visit: Payer: Medicare PPO

## 2021-05-02 ENCOUNTER — Ambulatory Visit (INDEPENDENT_AMBULATORY_CARE_PROVIDER_SITE_OTHER): Payer: Medicare PPO | Admitting: Podiatry

## 2021-05-02 DIAGNOSIS — L97529 Non-pressure chronic ulcer of other part of left foot with unspecified severity: Secondary | ICD-10-CM | POA: Diagnosis not present

## 2021-05-02 DIAGNOSIS — B351 Tinea unguium: Secondary | ICD-10-CM | POA: Diagnosis not present

## 2021-05-02 DIAGNOSIS — M79675 Pain in left toe(s): Secondary | ICD-10-CM | POA: Diagnosis not present

## 2021-05-02 DIAGNOSIS — Z7901 Long term (current) use of anticoagulants: Secondary | ICD-10-CM

## 2021-05-02 DIAGNOSIS — L84 Corns and callosities: Secondary | ICD-10-CM

## 2021-05-02 DIAGNOSIS — E11621 Type 2 diabetes mellitus with foot ulcer: Secondary | ICD-10-CM | POA: Diagnosis not present

## 2021-05-02 DIAGNOSIS — M79674 Pain in right toe(s): Secondary | ICD-10-CM

## 2021-05-03 ENCOUNTER — Telehealth: Payer: Self-pay | Admitting: Neurology

## 2021-05-03 ENCOUNTER — Other Ambulatory Visit (HOSPITAL_COMMUNITY): Payer: Self-pay | Admitting: *Deleted

## 2021-05-03 DIAGNOSIS — E1121 Type 2 diabetes mellitus with diabetic nephropathy: Secondary | ICD-10-CM | POA: Diagnosis not present

## 2021-05-03 DIAGNOSIS — R1310 Dysphagia, unspecified: Secondary | ICD-10-CM

## 2021-05-03 NOTE — Telephone Encounter (Signed)
Pt called the driving evaluation center in [facility], but the closest he can get an appointment was 10/22/21. He doesn't want to wait that long. He would like to see if there is any way to expedite that?

## 2021-05-05 NOTE — Telephone Encounter (Signed)
He can contact [facility] in [place]. [phone]. They will come to him and do an evaluation.  Please call them and send referral as well just so that we get notes back

## 2021-05-06 ENCOUNTER — Encounter: Payer: Medicare PPO | Admitting: Neurology

## 2021-05-06 DIAGNOSIS — H40013 Open angle with borderline findings, low risk, bilateral: Secondary | ICD-10-CM | POA: Diagnosis not present

## 2021-05-06 DIAGNOSIS — H04123 Dry eye syndrome of bilateral lacrimal glands: Secondary | ICD-10-CM | POA: Diagnosis not present

## 2021-05-06 DIAGNOSIS — H16223 Keratoconjunctivitis sicca, not specified as Sjogren's, bilateral: Secondary | ICD-10-CM | POA: Diagnosis not present

## 2021-05-06 DIAGNOSIS — E119 Type 2 diabetes mellitus without complications: Secondary | ICD-10-CM | POA: Diagnosis not present

## 2021-05-06 DIAGNOSIS — H3412 Central retinal artery occlusion, left eye: Secondary | ICD-10-CM | POA: Diagnosis not present

## 2021-05-06 DIAGNOSIS — H353121 Nonexudative age-related macular degeneration, left eye, early dry stage: Secondary | ICD-10-CM | POA: Diagnosis not present

## 2021-05-06 NOTE — Telephone Encounter (Signed)
No answer at 140 05/06/2021 .

## 2021-05-07 ENCOUNTER — Ambulatory Visit: Payer: Medicare PPO | Admitting: Neurology

## 2021-05-07 DIAGNOSIS — N1831 Chronic kidney disease, stage 3a: Secondary | ICD-10-CM | POA: Diagnosis not present

## 2021-05-07 DIAGNOSIS — E1122 Type 2 diabetes mellitus with diabetic chronic kidney disease: Secondary | ICD-10-CM | POA: Diagnosis not present

## 2021-05-07 DIAGNOSIS — I69398 Other sequelae of cerebral infarction: Secondary | ICD-10-CM | POA: Diagnosis not present

## 2021-05-07 DIAGNOSIS — I69320 Aphasia following cerebral infarction: Secondary | ICD-10-CM | POA: Diagnosis not present

## 2021-05-07 DIAGNOSIS — I69391 Dysphagia following cerebral infarction: Secondary | ICD-10-CM | POA: Diagnosis not present

## 2021-05-07 DIAGNOSIS — I69393 Ataxia following cerebral infarction: Secondary | ICD-10-CM | POA: Diagnosis not present

## 2021-05-07 DIAGNOSIS — H5462 Unqualified visual loss, left eye, normal vision right eye: Secondary | ICD-10-CM | POA: Diagnosis not present

## 2021-05-07 DIAGNOSIS — I1310 Hypertensive heart and chronic kidney disease without heart failure, with stage 1 through stage 4 chronic kidney disease, or unspecified chronic kidney disease: Secondary | ICD-10-CM | POA: Diagnosis not present

## 2021-05-07 DIAGNOSIS — R1310 Dysphagia, unspecified: Secondary | ICD-10-CM | POA: Diagnosis not present

## 2021-05-07 NOTE — Telephone Encounter (Signed)
Ellwyn said he missed a call from [facility] yesterday, returning her call.

## 2021-05-07 NOTE — Telephone Encounter (Signed)
No answer at [facility] 05/07/2021

## 2021-05-07 NOTE — Progress Notes (Signed)
Subjective: 70 year old male presents the office today for diabetic foot evaluation and for elongated nails that he cannot trim himself as well as calluses.  He has not seen any openings, ulcerations to his feet.  No increase in swelling or redness.  He has a last saw him while he was out of town he apparently had a stroke and also was bacteremic.  Likely was a contaminant.  He has no other lower extremity concerns today.  He was recently in the emergency room with a week and for increased blood pressure he has follow-up scheduled for this.  Objective: AAO x3, NAD DP/PT pulses palpable bilaterally, CRT less than 3 seconds Sensation decreased with Semmes Weinstein monofilament  Nails are hypertrophic, dystrophic, brittle, discolored, elongated 10. No surrounding redness or drainage. Tenderness nails 1-5 bilaterally.  Cavus foot type is present resulting in plantarflexed first ray and prominent fifth metatarsal head resulting in minimal hyperkeratotic tissue.  There is no underlying ulcerations identified.  There is no edema, erythema.  Spurring present the dorsal midfoot from arthritic changes.  No other areas of discomfort.  MMT 5/5.  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  -Debrided hyperkeratotic lesions x4 without any complications or bleeding -Sharply debrided the toenails x10 without any complications or bleeding -Glucose control  -Patient encouraged to call the office with any questions, concerns, change in symptoms.   RTC 3 months  Trula Slade DPM

## 2021-05-08 ENCOUNTER — Ambulatory Visit (HOSPITAL_COMMUNITY)
Admission: RE | Admit: 2021-05-08 | Discharge: 2021-05-08 | Disposition: A | Discharge status: Home / Self Care | Payer: Medicare PPO | Source: Ambulatory Visit | Attending: Internal Medicine | Admitting: Internal Medicine

## 2021-05-08 ENCOUNTER — Other Ambulatory Visit: Payer: Self-pay

## 2021-05-08 ENCOUNTER — Ambulatory Visit (HOSPITAL_COMMUNITY)
Admission: RE | Admit: 2021-05-08 | Discharge: 2021-05-08 | Disposition: A | Discharge status: Home / Self Care | Payer: Medicare PPO | Source: Ambulatory Visit | Attending: Neurology | Admitting: Neurology

## 2021-05-08 DIAGNOSIS — R1310 Dysphagia, unspecified: Secondary | ICD-10-CM

## 2021-05-08 DIAGNOSIS — R1313 Dysphagia, pharyngeal phase: Secondary | ICD-10-CM | POA: Insufficient documentation

## 2021-05-08 DIAGNOSIS — Z0389 Encounter for observation for other suspected diseases and conditions ruled out: Secondary | ICD-10-CM | POA: Diagnosis not present

## 2021-05-08 NOTE — Progress Notes (Signed)
Modified Barium Swallow Progress Note  Patient Details  Name: Joshua [name] MRN: [id number] Date of Birth: 24-Dec-1950  Today's Date: 05/08/2021  Modified Barium Swallow completed.  Full report located under Chart Review in the Imaging Section.  Brief recommendations include the following:  Clinical Impression  Pt presents with pharyngeal dysphagia characterized by reduced anterior laryngeal movement and a pharyngeal delay which appears worse compared to most recent modified barium swallow study on 09/13/20. Pt demonstrated inconsistent penetration (PAS 3) during deglutition and sensed aspiration (PAS 7) after deglutition. Penetration resulted in throat clearing and coughing was noted following instances of aspiration. Coughing was effective in expelling penetrated material from the laryngeal vestibule, but no functional benefit was noted with throat clearing which only mobilized the penetrate. Pyriform sinus residue was cleared with pt's independent use of secondary swallows. Frequency of laryngeal invsaion increased with larger boluses via straw and with use of consecutive swallows. With smaller boluses, a chin tuck posture improved penetration to PAS 2 (considered normal) or eliminated it. Penetration (PAS 3) was still inconsistently observed with larger boluses when a chin tuck was used, but the penetrate was expelled with prompted coughing and a secondary swallow. A regular texture diet with thin liquids is recommended at this time with observance of swallowing precautions as outlined below. Pt and his wife were educated regarding regular and recommendations. SLP is in agreement with dysphagia treatment and pt expressed interest regarding this.   Swallow Evaluation Recommendations       SLP Diet Recommendations: Regular solids;Thin liquid       Medication Administration: Whole meds with liquid       Compensations: Slow rate;Small sips/bites;Chin tuck;Hard cough after swallow of  liquids; second swallow after cough       Oral Care Recommendations: Oral care BID       Tish Begin I. Hardin Negus, [place], [place] Office number [phone] Pager [place] 05/08/2021,4:08 PM

## 2021-05-09 ENCOUNTER — Ambulatory Visit: Payer: Medicare PPO

## 2021-05-17 ENCOUNTER — Ambulatory Visit: Payer: Medicare PPO

## 2021-05-22 DIAGNOSIS — I69398 Other sequelae of cerebral infarction: Secondary | ICD-10-CM | POA: Diagnosis not present

## 2021-05-22 DIAGNOSIS — N1831 Chronic kidney disease, stage 3a: Secondary | ICD-10-CM | POA: Diagnosis not present

## 2021-05-22 DIAGNOSIS — I69391 Dysphagia following cerebral infarction: Secondary | ICD-10-CM | POA: Diagnosis not present

## 2021-05-22 DIAGNOSIS — I1310 Hypertensive heart and chronic kidney disease without heart failure, with stage 1 through stage 4 chronic kidney disease, or unspecified chronic kidney disease: Secondary | ICD-10-CM | POA: Diagnosis not present

## 2021-05-22 DIAGNOSIS — E1122 Type 2 diabetes mellitus with diabetic chronic kidney disease: Secondary | ICD-10-CM | POA: Diagnosis not present

## 2021-05-22 DIAGNOSIS — I69393 Ataxia following cerebral infarction: Secondary | ICD-10-CM | POA: Diagnosis not present

## 2021-05-22 DIAGNOSIS — R1310 Dysphagia, unspecified: Secondary | ICD-10-CM | POA: Diagnosis not present

## 2021-05-22 DIAGNOSIS — I69320 Aphasia following cerebral infarction: Secondary | ICD-10-CM | POA: Diagnosis not present

## 2021-05-22 DIAGNOSIS — H5462 Unqualified visual loss, left eye, normal vision right eye: Secondary | ICD-10-CM | POA: Diagnosis not present

## 2021-05-28 DIAGNOSIS — I69398 Other sequelae of cerebral infarction: Secondary | ICD-10-CM | POA: Diagnosis not present

## 2021-05-28 DIAGNOSIS — E1122 Type 2 diabetes mellitus with diabetic chronic kidney disease: Secondary | ICD-10-CM | POA: Diagnosis not present

## 2021-05-28 DIAGNOSIS — R1310 Dysphagia, unspecified: Secondary | ICD-10-CM | POA: Diagnosis not present

## 2021-05-28 DIAGNOSIS — N1831 Chronic kidney disease, stage 3a: Secondary | ICD-10-CM | POA: Diagnosis not present

## 2021-05-28 DIAGNOSIS — I69391 Dysphagia following cerebral infarction: Secondary | ICD-10-CM | POA: Diagnosis not present

## 2021-05-28 DIAGNOSIS — H5462 Unqualified visual loss, left eye, normal vision right eye: Secondary | ICD-10-CM | POA: Diagnosis not present

## 2021-05-28 DIAGNOSIS — I69320 Aphasia following cerebral infarction: Secondary | ICD-10-CM | POA: Diagnosis not present

## 2021-05-28 DIAGNOSIS — I1310 Hypertensive heart and chronic kidney disease without heart failure, with stage 1 through stage 4 chronic kidney disease, or unspecified chronic kidney disease: Secondary | ICD-10-CM | POA: Diagnosis not present

## 2021-05-28 DIAGNOSIS — I69393 Ataxia following cerebral infarction: Secondary | ICD-10-CM | POA: Diagnosis not present

## 2021-05-29 DIAGNOSIS — E1122 Type 2 diabetes mellitus with diabetic chronic kidney disease: Secondary | ICD-10-CM | POA: Diagnosis not present

## 2021-05-29 DIAGNOSIS — I1310 Hypertensive heart and chronic kidney disease without heart failure, with stage 1 through stage 4 chronic kidney disease, or unspecified chronic kidney disease: Secondary | ICD-10-CM | POA: Diagnosis not present

## 2021-05-29 DIAGNOSIS — H5462 Unqualified visual loss, left eye, normal vision right eye: Secondary | ICD-10-CM | POA: Diagnosis not present

## 2021-05-29 DIAGNOSIS — I69320 Aphasia following cerebral infarction: Secondary | ICD-10-CM | POA: Diagnosis not present

## 2021-05-29 DIAGNOSIS — I69398 Other sequelae of cerebral infarction: Secondary | ICD-10-CM | POA: Diagnosis not present

## 2021-05-29 DIAGNOSIS — R1310 Dysphagia, unspecified: Secondary | ICD-10-CM | POA: Diagnosis not present

## 2021-05-29 DIAGNOSIS — I69393 Ataxia following cerebral infarction: Secondary | ICD-10-CM | POA: Diagnosis not present

## 2021-05-29 DIAGNOSIS — I69391 Dysphagia following cerebral infarction: Secondary | ICD-10-CM | POA: Diagnosis not present

## 2021-05-29 DIAGNOSIS — N1831 Chronic kidney disease, stage 3a: Secondary | ICD-10-CM | POA: Diagnosis not present

## 2021-05-31 ENCOUNTER — Telehealth (HOSPITAL_BASED_OUTPATIENT_CLINIC_OR_DEPARTMENT_OTHER): Payer: Self-pay | Admitting: Cardiology

## 2021-05-31 DIAGNOSIS — H6241 Otitis externa in other diseases classified elsewhere, right ear: Secondary | ICD-10-CM | POA: Diagnosis not present

## 2021-05-31 DIAGNOSIS — B369 Superficial mycosis, unspecified: Secondary | ICD-10-CM | POA: Diagnosis not present

## 2021-05-31 NOTE — Telephone Encounter (Signed)
Left message for patient to call and schedule the Myocardial Amyloid study ordered by Dr. Gardiner Rhyme

## 2021-06-02 DIAGNOSIS — E1121 Type 2 diabetes mellitus with diabetic nephropathy: Secondary | ICD-10-CM | POA: Diagnosis not present

## 2021-06-03 DIAGNOSIS — E1121 Type 2 diabetes mellitus with diabetic nephropathy: Secondary | ICD-10-CM | POA: Diagnosis not present

## 2021-06-03 DIAGNOSIS — Z1331 Encounter for screening for depression: Secondary | ICD-10-CM | POA: Diagnosis not present

## 2021-06-03 DIAGNOSIS — I69320 Aphasia following cerebral infarction: Secondary | ICD-10-CM | POA: Diagnosis not present

## 2021-06-03 DIAGNOSIS — I422 Other hypertrophic cardiomyopathy: Secondary | ICD-10-CM | POA: Diagnosis not present

## 2021-06-03 DIAGNOSIS — I129 Hypertensive chronic kidney disease with stage 1 through stage 4 chronic kidney disease, or unspecified chronic kidney disease: Secondary | ICD-10-CM | POA: Diagnosis not present

## 2021-06-03 DIAGNOSIS — N1831 Chronic kidney disease, stage 3a: Secondary | ICD-10-CM | POA: Diagnosis not present

## 2021-06-03 DIAGNOSIS — E1122 Type 2 diabetes mellitus with diabetic chronic kidney disease: Secondary | ICD-10-CM | POA: Diagnosis not present

## 2021-06-03 DIAGNOSIS — H5462 Unqualified visual loss, left eye, normal vision right eye: Secondary | ICD-10-CM | POA: Diagnosis not present

## 2021-06-03 DIAGNOSIS — I1310 Hypertensive heart and chronic kidney disease without heart failure, with stage 1 through stage 4 chronic kidney disease, or unspecified chronic kidney disease: Secondary | ICD-10-CM | POA: Diagnosis not present

## 2021-06-03 DIAGNOSIS — E1129 Type 2 diabetes mellitus with other diabetic kidney complication: Secondary | ICD-10-CM | POA: Diagnosis not present

## 2021-06-03 DIAGNOSIS — Z7901 Long term (current) use of anticoagulants: Secondary | ICD-10-CM | POA: Diagnosis not present

## 2021-06-03 DIAGNOSIS — I700 Atherosclerosis of aorta: Secondary | ICD-10-CM | POA: Diagnosis not present

## 2021-06-03 DIAGNOSIS — Z1389 Encounter for screening for other disorder: Secondary | ICD-10-CM | POA: Diagnosis not present

## 2021-06-03 DIAGNOSIS — I69393 Ataxia following cerebral infarction: Secondary | ICD-10-CM | POA: Diagnosis not present

## 2021-06-03 DIAGNOSIS — I69398 Other sequelae of cerebral infarction: Secondary | ICD-10-CM | POA: Diagnosis not present

## 2021-06-03 DIAGNOSIS — R1310 Dysphagia, unspecified: Secondary | ICD-10-CM | POA: Diagnosis not present

## 2021-06-03 DIAGNOSIS — I748 Embolism and thrombosis of other arteries: Secondary | ICD-10-CM | POA: Diagnosis not present

## 2021-06-03 DIAGNOSIS — I69391 Dysphagia following cerebral infarction: Secondary | ICD-10-CM | POA: Diagnosis not present

## 2021-06-03 NOTE — Progress Notes (Signed)
No PA needed ref number [id number]

## 2021-06-03 NOTE — Telephone Encounter (Signed)
Left message for patient to call and discuss schedule the Amyloid study ordered by Dr. Gardiner Rhyme

## 2021-06-04 ENCOUNTER — Telehealth (HOSPITAL_COMMUNITY): Payer: Self-pay | Admitting: *Deleted

## 2021-06-04 NOTE — Telephone Encounter (Signed)
Close encounter 

## 2021-06-06 DIAGNOSIS — H5462 Unqualified visual loss, left eye, normal vision right eye: Secondary | ICD-10-CM | POA: Diagnosis not present

## 2021-06-06 DIAGNOSIS — I69393 Ataxia following cerebral infarction: Secondary | ICD-10-CM | POA: Diagnosis not present

## 2021-06-06 DIAGNOSIS — N1831 Chronic kidney disease, stage 3a: Secondary | ICD-10-CM | POA: Diagnosis not present

## 2021-06-06 DIAGNOSIS — I1310 Hypertensive heart and chronic kidney disease without heart failure, with stage 1 through stage 4 chronic kidney disease, or unspecified chronic kidney disease: Secondary | ICD-10-CM | POA: Diagnosis not present

## 2021-06-06 DIAGNOSIS — I69320 Aphasia following cerebral infarction: Secondary | ICD-10-CM | POA: Diagnosis not present

## 2021-06-06 DIAGNOSIS — I69391 Dysphagia following cerebral infarction: Secondary | ICD-10-CM | POA: Diagnosis not present

## 2021-06-06 DIAGNOSIS — I69398 Other sequelae of cerebral infarction: Secondary | ICD-10-CM | POA: Diagnosis not present

## 2021-06-06 DIAGNOSIS — E1122 Type 2 diabetes mellitus with diabetic chronic kidney disease: Secondary | ICD-10-CM | POA: Diagnosis not present

## 2021-06-06 DIAGNOSIS — R1310 Dysphagia, unspecified: Secondary | ICD-10-CM | POA: Diagnosis not present

## 2021-06-07 ENCOUNTER — Ambulatory Visit (HOSPITAL_COMMUNITY)
Admission: RE | Admit: 2021-06-07 | Discharge: 2021-06-07 | Disposition: A | Discharge status: Home / Self Care | Payer: Medicare PPO | Source: Ambulatory Visit | Attending: Cardiology | Admitting: Cardiology

## 2021-06-07 ENCOUNTER — Other Ambulatory Visit: Payer: Self-pay

## 2021-06-07 DIAGNOSIS — I422 Other hypertrophic cardiomyopathy: Secondary | ICD-10-CM | POA: Diagnosis not present

## 2021-06-07 MED ORDER — TECHNETIUM TC 99M PYROPHOSPHATE
20.5000 | Freq: Once | INTRAVENOUS | Status: AC
  Administered 2021-06-07: 20.5 via INTRAVENOUS

## 2021-06-10 DIAGNOSIS — R1310 Dysphagia, unspecified: Secondary | ICD-10-CM | POA: Diagnosis not present

## 2021-06-10 DIAGNOSIS — I69320 Aphasia following cerebral infarction: Secondary | ICD-10-CM | POA: Diagnosis not present

## 2021-06-10 DIAGNOSIS — I1310 Hypertensive heart and chronic kidney disease without heart failure, with stage 1 through stage 4 chronic kidney disease, or unspecified chronic kidney disease: Secondary | ICD-10-CM | POA: Diagnosis not present

## 2021-06-10 DIAGNOSIS — E1122 Type 2 diabetes mellitus with diabetic chronic kidney disease: Secondary | ICD-10-CM | POA: Diagnosis not present

## 2021-06-10 DIAGNOSIS — H5462 Unqualified visual loss, left eye, normal vision right eye: Secondary | ICD-10-CM | POA: Diagnosis not present

## 2021-06-10 DIAGNOSIS — I69393 Ataxia following cerebral infarction: Secondary | ICD-10-CM | POA: Diagnosis not present

## 2021-06-10 DIAGNOSIS — N1831 Chronic kidney disease, stage 3a: Secondary | ICD-10-CM | POA: Diagnosis not present

## 2021-06-10 DIAGNOSIS — I69391 Dysphagia following cerebral infarction: Secondary | ICD-10-CM | POA: Diagnosis not present

## 2021-06-10 DIAGNOSIS — I69398 Other sequelae of cerebral infarction: Secondary | ICD-10-CM | POA: Diagnosis not present

## 2021-06-12 DIAGNOSIS — I69391 Dysphagia following cerebral infarction: Secondary | ICD-10-CM | POA: Diagnosis not present

## 2021-06-12 DIAGNOSIS — R1310 Dysphagia, unspecified: Secondary | ICD-10-CM | POA: Diagnosis not present

## 2021-06-12 DIAGNOSIS — I1310 Hypertensive heart and chronic kidney disease without heart failure, with stage 1 through stage 4 chronic kidney disease, or unspecified chronic kidney disease: Secondary | ICD-10-CM | POA: Diagnosis not present

## 2021-06-12 DIAGNOSIS — N1831 Chronic kidney disease, stage 3a: Secondary | ICD-10-CM | POA: Diagnosis not present

## 2021-06-12 DIAGNOSIS — I69398 Other sequelae of cerebral infarction: Secondary | ICD-10-CM | POA: Diagnosis not present

## 2021-06-12 DIAGNOSIS — I69393 Ataxia following cerebral infarction: Secondary | ICD-10-CM | POA: Diagnosis not present

## 2021-06-12 DIAGNOSIS — E1122 Type 2 diabetes mellitus with diabetic chronic kidney disease: Secondary | ICD-10-CM | POA: Diagnosis not present

## 2021-06-12 DIAGNOSIS — I69320 Aphasia following cerebral infarction: Secondary | ICD-10-CM | POA: Diagnosis not present

## 2021-06-12 DIAGNOSIS — H5462 Unqualified visual loss, left eye, normal vision right eye: Secondary | ICD-10-CM | POA: Diagnosis not present

## 2021-06-17 DIAGNOSIS — I69398 Other sequelae of cerebral infarction: Secondary | ICD-10-CM | POA: Diagnosis not present

## 2021-06-17 DIAGNOSIS — I69320 Aphasia following cerebral infarction: Secondary | ICD-10-CM | POA: Diagnosis not present

## 2021-06-17 DIAGNOSIS — I69391 Dysphagia following cerebral infarction: Secondary | ICD-10-CM | POA: Diagnosis not present

## 2021-06-17 DIAGNOSIS — E1122 Type 2 diabetes mellitus with diabetic chronic kidney disease: Secondary | ICD-10-CM | POA: Diagnosis not present

## 2021-06-17 DIAGNOSIS — R1310 Dysphagia, unspecified: Secondary | ICD-10-CM | POA: Diagnosis not present

## 2021-06-17 DIAGNOSIS — I1310 Hypertensive heart and chronic kidney disease without heart failure, with stage 1 through stage 4 chronic kidney disease, or unspecified chronic kidney disease: Secondary | ICD-10-CM | POA: Diagnosis not present

## 2021-06-17 DIAGNOSIS — H5462 Unqualified visual loss, left eye, normal vision right eye: Secondary | ICD-10-CM | POA: Diagnosis not present

## 2021-06-17 DIAGNOSIS — N1831 Chronic kidney disease, stage 3a: Secondary | ICD-10-CM | POA: Diagnosis not present

## 2021-06-17 DIAGNOSIS — I69393 Ataxia following cerebral infarction: Secondary | ICD-10-CM | POA: Diagnosis not present

## 2021-06-21 DIAGNOSIS — I69391 Dysphagia following cerebral infarction: Secondary | ICD-10-CM | POA: Diagnosis not present

## 2021-06-21 DIAGNOSIS — E1122 Type 2 diabetes mellitus with diabetic chronic kidney disease: Secondary | ICD-10-CM | POA: Diagnosis not present

## 2021-06-21 DIAGNOSIS — I1310 Hypertensive heart and chronic kidney disease without heart failure, with stage 1 through stage 4 chronic kidney disease, or unspecified chronic kidney disease: Secondary | ICD-10-CM | POA: Diagnosis not present

## 2021-06-21 DIAGNOSIS — R1310 Dysphagia, unspecified: Secondary | ICD-10-CM | POA: Diagnosis not present

## 2021-06-21 DIAGNOSIS — H5462 Unqualified visual loss, left eye, normal vision right eye: Secondary | ICD-10-CM | POA: Diagnosis not present

## 2021-06-21 DIAGNOSIS — N1831 Chronic kidney disease, stage 3a: Secondary | ICD-10-CM | POA: Diagnosis not present

## 2021-06-21 DIAGNOSIS — I69320 Aphasia following cerebral infarction: Secondary | ICD-10-CM | POA: Diagnosis not present

## 2021-06-21 DIAGNOSIS — I69398 Other sequelae of cerebral infarction: Secondary | ICD-10-CM | POA: Diagnosis not present

## 2021-06-21 DIAGNOSIS — I69393 Ataxia following cerebral infarction: Secondary | ICD-10-CM | POA: Diagnosis not present

## 2021-06-24 DIAGNOSIS — I69393 Ataxia following cerebral infarction: Secondary | ICD-10-CM | POA: Diagnosis not present

## 2021-06-24 DIAGNOSIS — I69391 Dysphagia following cerebral infarction: Secondary | ICD-10-CM | POA: Diagnosis not present

## 2021-06-24 DIAGNOSIS — I69398 Other sequelae of cerebral infarction: Secondary | ICD-10-CM | POA: Diagnosis not present

## 2021-06-24 DIAGNOSIS — I1310 Hypertensive heart and chronic kidney disease without heart failure, with stage 1 through stage 4 chronic kidney disease, or unspecified chronic kidney disease: Secondary | ICD-10-CM | POA: Diagnosis not present

## 2021-06-24 DIAGNOSIS — R1310 Dysphagia, unspecified: Secondary | ICD-10-CM | POA: Diagnosis not present

## 2021-06-24 DIAGNOSIS — E1122 Type 2 diabetes mellitus with diabetic chronic kidney disease: Secondary | ICD-10-CM | POA: Diagnosis not present

## 2021-06-24 DIAGNOSIS — N1831 Chronic kidney disease, stage 3a: Secondary | ICD-10-CM | POA: Diagnosis not present

## 2021-06-24 DIAGNOSIS — I69320 Aphasia following cerebral infarction: Secondary | ICD-10-CM | POA: Diagnosis not present

## 2021-06-24 DIAGNOSIS — H5462 Unqualified visual loss, left eye, normal vision right eye: Secondary | ICD-10-CM | POA: Diagnosis not present

## 2021-07-01 ENCOUNTER — Ambulatory Visit: Payer: Medicare PPO | Admitting: Cardiology

## 2021-07-03 DIAGNOSIS — E1122 Type 2 diabetes mellitus with diabetic chronic kidney disease: Secondary | ICD-10-CM | POA: Diagnosis not present

## 2021-07-03 DIAGNOSIS — I69398 Other sequelae of cerebral infarction: Secondary | ICD-10-CM | POA: Diagnosis not present

## 2021-07-03 DIAGNOSIS — I69393 Ataxia following cerebral infarction: Secondary | ICD-10-CM | POA: Diagnosis not present

## 2021-07-03 DIAGNOSIS — I1310 Hypertensive heart and chronic kidney disease without heart failure, with stage 1 through stage 4 chronic kidney disease, or unspecified chronic kidney disease: Secondary | ICD-10-CM | POA: Diagnosis not present

## 2021-07-03 DIAGNOSIS — N1831 Chronic kidney disease, stage 3a: Secondary | ICD-10-CM | POA: Diagnosis not present

## 2021-07-03 DIAGNOSIS — I69320 Aphasia following cerebral infarction: Secondary | ICD-10-CM | POA: Diagnosis not present

## 2021-07-03 DIAGNOSIS — I69391 Dysphagia following cerebral infarction: Secondary | ICD-10-CM | POA: Diagnosis not present

## 2021-07-03 DIAGNOSIS — H5462 Unqualified visual loss, left eye, normal vision right eye: Secondary | ICD-10-CM | POA: Diagnosis not present

## 2021-07-03 DIAGNOSIS — E1121 Type 2 diabetes mellitus with diabetic nephropathy: Secondary | ICD-10-CM | POA: Diagnosis not present

## 2021-07-03 DIAGNOSIS — R1310 Dysphagia, unspecified: Secondary | ICD-10-CM | POA: Diagnosis not present

## 2021-07-05 ENCOUNTER — Encounter: Payer: Self-pay | Admitting: *Deleted

## 2021-07-24 DIAGNOSIS — I129 Hypertensive chronic kidney disease with stage 1 through stage 4 chronic kidney disease, or unspecified chronic kidney disease: Secondary | ICD-10-CM | POA: Diagnosis not present

## 2021-07-24 DIAGNOSIS — E1129 Type 2 diabetes mellitus with other diabetic kidney complication: Secondary | ICD-10-CM | POA: Diagnosis not present

## 2021-07-24 DIAGNOSIS — N1831 Chronic kidney disease, stage 3a: Secondary | ICD-10-CM | POA: Diagnosis not present

## 2021-08-03 DIAGNOSIS — E1121 Type 2 diabetes mellitus with diabetic nephropathy: Secondary | ICD-10-CM | POA: Diagnosis not present

## 2021-08-05 ENCOUNTER — Encounter: Payer: Self-pay | Admitting: Podiatry

## 2021-08-05 ENCOUNTER — Other Ambulatory Visit: Payer: Self-pay

## 2021-08-05 ENCOUNTER — Ambulatory Visit: Payer: Medicare PPO | Admitting: Podiatry

## 2021-08-05 DIAGNOSIS — J9601 Acute respiratory failure with hypoxia: Secondary | ICD-10-CM | POA: Insufficient documentation

## 2021-08-05 DIAGNOSIS — Z461 Encounter for fitting and adjustment of hearing aid: Secondary | ICD-10-CM | POA: Insufficient documentation

## 2021-08-05 DIAGNOSIS — M1711 Unilateral primary osteoarthritis, right knee: Secondary | ICD-10-CM | POA: Insufficient documentation

## 2021-08-05 DIAGNOSIS — R410 Disorientation, unspecified: Secondary | ICD-10-CM | POA: Insufficient documentation

## 2021-08-05 DIAGNOSIS — R270 Ataxia, unspecified: Secondary | ICD-10-CM | POA: Insufficient documentation

## 2021-08-05 DIAGNOSIS — M79675 Pain in left toe(s): Secondary | ICD-10-CM

## 2021-08-05 DIAGNOSIS — I3139 Other pericardial effusion (noninflammatory): Secondary | ICD-10-CM | POA: Insufficient documentation

## 2021-08-05 DIAGNOSIS — M79674 Pain in right toe(s): Secondary | ICD-10-CM | POA: Diagnosis not present

## 2021-08-05 DIAGNOSIS — L603 Nail dystrophy: Secondary | ICD-10-CM | POA: Insufficient documentation

## 2021-08-05 DIAGNOSIS — L97529 Non-pressure chronic ulcer of other part of left foot with unspecified severity: Secondary | ICD-10-CM

## 2021-08-05 DIAGNOSIS — L84 Corns and callosities: Secondary | ICD-10-CM | POA: Diagnosis not present

## 2021-08-05 DIAGNOSIS — Z7901 Long term (current) use of anticoagulants: Secondary | ICD-10-CM

## 2021-08-05 DIAGNOSIS — E11621 Type 2 diabetes mellitus with foot ulcer: Secondary | ICD-10-CM | POA: Diagnosis not present

## 2021-08-05 DIAGNOSIS — G629 Polyneuropathy, unspecified: Secondary | ICD-10-CM | POA: Insufficient documentation

## 2021-08-05 DIAGNOSIS — L97509 Non-pressure chronic ulcer of other part of unspecified foot with unspecified severity: Secondary | ICD-10-CM | POA: Insufficient documentation

## 2021-08-05 DIAGNOSIS — R609 Edema, unspecified: Secondary | ICD-10-CM | POA: Insufficient documentation

## 2021-08-05 DIAGNOSIS — B958 Unspecified staphylococcus as the cause of diseases classified elsewhere: Secondary | ICD-10-CM | POA: Insufficient documentation

## 2021-08-05 DIAGNOSIS — B351 Tinea unguium: Secondary | ICD-10-CM

## 2021-08-05 DIAGNOSIS — R0902 Hypoxemia: Secondary | ICD-10-CM | POA: Insufficient documentation

## 2021-08-05 DIAGNOSIS — I313 Pericardial effusion (noninflammatory): Secondary | ICD-10-CM | POA: Insufficient documentation

## 2021-08-07 NOTE — Progress Notes (Signed)
Subjective: 70 year old male presents the office today for diabetic foot evaluation and for elongated nails that he cannot trim himself as well as calluses.  He still gets a callus but he keeps filing keep moisturizer on it.  Denies any skin breakdown, swelling or redness or any drainage.  He has no other concerns today.   Last A1c was 7.9 on Apr 21, 2021 [name], MD-last seen June 11, 2021   Objective: AAO x3, NAD DP/PT pulses palpable bilaterally, CRT less than 3 seconds Sensation decreased with Semmes Weinstein monofilament  Nails are hypertrophic, dystrophic, brittle, discolored, elongated 10. No surrounding redness or drainage. Tenderness nails 1-5 bilaterally.  Cavus foot type is present resulting in plantarflexed first ray and prominent fifth metatarsal head resulting in hyperkeratotic tissue submetatarsal 1 and 5 bilaterally.  There is no underlying ulcerations identified.  There is no edema, erythema.  Spurring present the dorsal midfoot from arthritic changes.  No other areas of discomfort.  MMT 5/5.  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  -Debrided hyperkeratotic lesions x4 without any complications or bleeding -Sharply debrided the toenails x10 without any complications or bleeding -Glucose control  -Patient encouraged to call the office with any questions, concerns, change in symptoms.   RTC 3 months  Trula Slade DPM

## 2021-08-12 NOTE — Progress Notes (Deleted)
Cardiology Office Note:    Date:  08/12/2021   ID:  Joshua [name], DOB 05-03-1951, MRN [id number]  PCP:  Joshua Baton, MD  Cardiologist:  Joshua Heinz, MD   Referring MD: Joshua Baton, MD   No chief complaint on file. ***  History of Present Illness:    Joshua [name] is a 70 y.o. male with a hx of HCM, HTN, DM, OSA, PE after knee surgery, essential tremor s/p DBS June 2020, and CVA 08/2019, 04/2021. Workup with echo 08/2019 showed no cardiogenic source of embolism. He was started on heparin for subclavian artery thrombosis and transitioned to eliquis.  MRI brain with right internal capsule infarct. No source was found for thombosis. Stroke team recommended eliquis for 2 months and repeating a CTA neck. If CTA neck negative and no Afib on 30 day monitor, then may stop eliquis.  Echo notably for asymmetric basal septal hypertrophy, cardiology consulted. Cardiac MRI showed basal septal hypertrophy measuring up to 20 mm, consistent with hypertrophic cardiomyopathy. PYP scan was not consistent with amyloid. Heart monitor showed NSVT and PSVT.   He as admitted 02/02/21 with pericardial effusion. Eliquis held and colchicine started. No adequate window for pericardiocentesis. Repeat echo 03/08/21 showed pericardial effusion had resolved, moderate LVH, and strain abnormalities suggestive of cardiac amyloidosis. Lexiscan myoview 02/12/21 negative for reversible ischemia. He was admitted to [facility] 5/29-04/24/21 after developing slurred speech and abnormal right hand sensation while traveling to [place] on 04/13/21. Head CT unremarkable, blood cultures negative. He was called back and sent to the ER for positive blood cultures, again discharged, brought back 04/21/21 and ID ruled contaminant. ABX discontinued. TEE on 04/23/21 negative for vegetation. He had a left sided CVA and neurology recommended adding ASA and continuing anticoagulation.  He was last seen by Dr. Gardiner [name] 04/30/21 and was doing well at  that time.    Hypertrophic cardiomyopathy without obstruction - completed cardiac MR - felt inconsistent with amyloid, but unable to complete T1 mapping to rule out amyloid due to DBS - no evidence of amyloid on PYP scan 2020, SPEP/UPEP/light chains do not show evidence of AL amyloid - given strong suspicion for amyloid, PYP scan was repeated on 06/07/21 that was not suggestive of amyloid - lasix PRN, avoid scheduled lasix for HCM   Pericardial effusion - large effusion on echo 01/2021 --> colchicine for suspected pericarditis --> echo 03/12/21 with resolution and colchicine D/C'ed after 3 month course   Dyspnea with minimal exertion - nonischemic lexiscan myoview   CVA - recurrent - subclavian artery thrombosis - no Afib on monitor - continue eliquis 5 mg BID + ASA 81 mg   Hypertension - continue 5 mg amlodipine, 100 mg losartan    Past Medical History:  Diagnosis Date   Benign essential tremor    Benign positional vertigo    CVA (cerebral vascular accident) ([place])    x2 - L retina, 1 right parietal   Degenerative arthritis    Depression    Diabetes mellitus    DVT (deep venous thrombosis) ([place]) 2018   Dyslipidemia    GERD (gastroesophageal reflux disease)    hiatal hernia   Gout    H/O: vasectomy    Hearing aid worn    b/l   Hx of appendectomy    Hx of tonsillectomy    Hypertension    Hypertrophic cardiomyopathy ([place])    Ischemic optic neuropathy    on the left   Melanoma ([place])    NSVT (nonsustained ventricular  tachycardia) ([place])    1 4 beat run on event monitor in 07/2020   Obesity    OSA on CPAP    setting = 5   Pulmonary emboli ([place]) 2018   PVC's (premature ventricular contractions)    SVT (supraventricular tachycardia) ([facility])    by event monitor   Tremor, essential 06/22/2017   Wears glasses     Past Surgical History:  Procedure Laterality Date   APPENDECTOMY     arthroscopic knee surgery Bilateral    CATARACT EXTRACTION Bilateral    COLONOSCOPY      MINOR PLACEMENT OF FIDUCIAL N/A 06/30/2019   Procedure: Fiducial placement;  Surgeon: Joshua Levine, MD;  Location: [facility];  Service: Neurosurgery;  Laterality: N/A;  Fiducial placement   NASAL SEPTUM SURGERY     PULSE GENERATOR IMPLANT N/A 07/14/2019   Procedure: Left cranial Implanted Pulse Generator and lead extension placement to right chest ;  Surgeon: Joshua Levine, MD;  Location: [place];  Service: Neurosurgery;  Laterality: N/A;   SUBTHALAMIC STIMULATOR INSERTION Left 07/07/2019   Procedure: LEFT DEEP BRAIN STIMULATOR PLACEMENT;  Surgeon: Joshua Levine, MD;  Location: [place];  Service: Neurosurgery;  Laterality: Left;   TEE WITHOUT CARDIOVERSION N/A 04/23/2021   Procedure: TRANSESOPHAGEAL ECHOCARDIOGRAM (TEE);  Surgeon: Joshua Fredrickson Wonda Cheng, MD;  Location[facility];  Service: Cardiovascular;  Laterality: N/A;   TONSILLECTOMY     TOTAL KNEE ARTHROPLASTY Left 03/30/2017   Procedure: LEFT TOTAL KNEE ARTHROPLASTY;  Surgeon: Joshua Cancel, MD;  Location: [facility];  Service: Orthopedics;  Laterality: Left;   WISDOM TOOTH EXTRACTION      Current Medications: No outpatient medications have been marked as taking for the 08/13/21 encounter (Appointment) with Joshua [name], [place].     Allergies:   Melatonin   Social History   Socioeconomic History   Marital status: Married    Spouse name: [name]   Number of children: 2   Years of education: Bachelors    Highest education level: Bachelor's degree (e.g., BA, AB, BS)  Occupational History   Occupation: retired    Fish farm manager: [employer]    Comment: teaching/coaching  Tobacco Use   Smoking status: Former    Packs/day: 1.00    Years: 10.00    Pack years: 10.00    Types: Cigarettes    Quit date: 11/25/1983    Years since quitting: 37.7   Smokeless tobacco: Never  Vaping Use   Vaping Use: Never used  Substance and Sexual Activity   Alcohol use: Yes    Alcohol/week: 2.0 standard drinks    Types: 2 Standard drinks or equivalent per  week    Comment: occassionally   Drug use: No   Sexual activity: Not on file  Other Topics Concern   Not on file  Social History Narrative   Patient lives at home with wife. [name](   Patient has 2 children that are in good health.    Patient works for [employer]. Retired .   Patient has a Bachelors degree in History.       Right handed    Lives in one story home - Handicap accessible    Social Determinants of Health   Financial Resource Strain: Not on file  Food Insecurity: Not on file  Transportation Needs: Not on file  Physical Activity: Not on file  Stress: Not on file  Social Connections: Not on file     Family History: The patient's ***family history includes Alzheimer's disease in his  father; Cerebral aneurysm in his mother; Diabetes in his maternal grandmother; Healthy in his son; Tremor in his brother, maternal uncle, and mother. There is no history of Colon cancer.  ROS:   Please see the history of present illness.    *** All other systems reviewed and are negative.  EKGs/Labs/Other Studies Reviewed:    The following studies were reviewed today: ***  EKG:  EKG is *** ordered today.  The ekg ordered today demonstrates ***  Recent Labs: 01/30/2021: TSH 1.421 03/26/2021: Magnesium 2.0 04/21/2021: B Natriuretic Peptide 13.0 04/22/2021: ALT 29 04/23/2021: Hemoglobin 12.5; Platelets 201 04/24/2021: BUN 28; Creatinine, Ser 1.66; Potassium 3.6; Sodium 142  Recent Lipid Panel    Component Value Date/Time   CHOL 110 04/23/2021 0717   CHOL 161 10/24/2020 0943   TRIG 172 (H) 04/23/2021 0717   HDL 23 (L) 04/23/2021 0717   HDL 30 (L) 10/24/2020 0943   CHOLHDL 4.8 04/23/2021 0717   VLDL 34 04/23/2021 0717   LDLCALC 53 04/23/2021 0717   LDLCALC 99 10/24/2020 0943    Physical Exam:    VS:  There were no vitals taken for this visit.    Wt Readings from Last 3 Encounters:  06/07/21 258 lb (117 kg)  05/01/21 258 lb (117 kg)  04/30/21 257 lb (116.6 kg)      GEN: *** Well nourished, well developed in no acute distress HEENT: Normal NECK: No JVD; No carotid bruits LYMPHATICS: No lymphadenopathy CARDIAC: ***RRR, no murmurs, rubs, gallops RESPIRATORY:  Clear to auscultation without rales, wheezing or rhonchi  ABDOMEN: Soft, non-tender, non-distended MUSCULOSKELETAL:  No edema; No deformity  SKIN: Warm and dry NEUROLOGIC:  Alert and oriented x 3 PSYCHIATRIC:  Normal affect   ASSESSMENT:    No diagnosis found. PLAN:    In order of problems listed above:  No diagnosis found.   Medication Adjustments/Labs and Tests Ordered: Current medicines are reviewed at length with the patient today.  Concerns regarding medicines are outlined above.  No orders of the defined types were placed in this encounter.  No orders of the defined types were placed in this encounter.   Signed, Joshua [name], [place]  08/12/2021 9:32 PM    [facility]

## 2021-08-13 ENCOUNTER — Ambulatory Visit: Payer: Medicare PPO | Admitting: Physician Assistant

## 2021-08-23 DIAGNOSIS — N1831 Chronic kidney disease, stage 3a: Secondary | ICD-10-CM | POA: Diagnosis not present

## 2021-08-23 DIAGNOSIS — E1129 Type 2 diabetes mellitus with other diabetic kidney complication: Secondary | ICD-10-CM | POA: Diagnosis not present

## 2021-08-23 DIAGNOSIS — I129 Hypertensive chronic kidney disease with stage 1 through stage 4 chronic kidney disease, or unspecified chronic kidney disease: Secondary | ICD-10-CM | POA: Diagnosis not present

## 2021-09-02 DIAGNOSIS — E1121 Type 2 diabetes mellitus with diabetic nephropathy: Secondary | ICD-10-CM | POA: Diagnosis not present

## 2021-09-11 ENCOUNTER — Telehealth: Payer: Self-pay | Admitting: Cardiology

## 2021-09-11 NOTE — Telephone Encounter (Signed)
Pt c/o medication issue:  1. Name of Medication: furosemide (LASIX) 40 MG tablet  2. How are you currently taking this medication (dosage and times per day)? Needs clarification  3. Are you having a reaction (difficulty breathing--STAT)? no  4. What is your medication issue? Patient states the instructions say to "take 1 tablet 40 mg by mouth as needed (Weight increased of 3 lbs in a day or 5 lbs in a weight.)" He would like to know long does he continue the medication if he does gain the weight.

## 2021-09-11 NOTE — Telephone Encounter (Signed)
Pt called to report that he has taken his Lasix 40 mg daily for 3 days going off the instructions to take for 3 lb weight gain in a day... he has gained one pound each day so he would go ahead and take another lasix... he does not have any peripheral edema... no dyspnea.. says he feels well... he has weight the same time each day... no change in diet.. no added salt.   He had already taken the lasix today... I asked him to only take if weight gain is 3+ lbs/ day.   He is asking why he is gaining a pound each day even taking the Lasix... he says he is urinating very often..   I asked him to monitor his symptoms not just the scale... I will forward to Dr. Gardiner Rhyme to see if the pt needs any labs.   Advised the pt we will follow up with him later today.    TEE 04/23/2021: 1. Left ventricular ejection fraction, by estimation, is 60 to 65%. The  left ventricle has normal function.

## 2021-09-11 NOTE — Telephone Encounter (Signed)
Spoke with patient, reports weight is up 4 lbs from last week.  He is asymptomatic, denies any dyspnea or edema.  Would hold off on further lasix for now, would take if weight up 3lbs in 1 day or 5 lbs in 1 week.  Hayley, could we touch base with him at the end of the week and see how he is doing?  If he is requiring more lasix this week can we check BMET/mag/BNP?  Thanks, Gerald Stabs

## 2021-09-13 NOTE — Telephone Encounter (Signed)
Called patient to follow up-patient reports he is doing well.  Reports weight is down 1 lb and denies any SOB or swelling.   Advised to continue to monitor and call with any changes or questions/concerns.

## 2021-09-16 DIAGNOSIS — E785 Hyperlipidemia, unspecified: Secondary | ICD-10-CM | POA: Diagnosis not present

## 2021-09-16 DIAGNOSIS — M109 Gout, unspecified: Secondary | ICD-10-CM | POA: Diagnosis not present

## 2021-09-16 DIAGNOSIS — Z125 Encounter for screening for malignant neoplasm of prostate: Secondary | ICD-10-CM | POA: Diagnosis not present

## 2021-09-16 DIAGNOSIS — E1121 Type 2 diabetes mellitus with diabetic nephropathy: Secondary | ICD-10-CM | POA: Diagnosis not present

## 2021-09-16 DIAGNOSIS — I10 Essential (primary) hypertension: Secondary | ICD-10-CM | POA: Diagnosis not present

## 2021-09-23 DIAGNOSIS — F325 Major depressive disorder, single episode, in full remission: Secondary | ICD-10-CM | POA: Diagnosis not present

## 2021-09-23 DIAGNOSIS — E1121 Type 2 diabetes mellitus with diabetic nephropathy: Secondary | ICD-10-CM | POA: Diagnosis not present

## 2021-09-23 DIAGNOSIS — E1129 Type 2 diabetes mellitus with other diabetic kidney complication: Secondary | ICD-10-CM | POA: Diagnosis not present

## 2021-09-23 DIAGNOSIS — M1990 Unspecified osteoarthritis, unspecified site: Secondary | ICD-10-CM | POA: Diagnosis not present

## 2021-09-23 DIAGNOSIS — I6990 Unspecified sequelae of unspecified cerebrovascular disease: Secondary | ICD-10-CM | POA: Diagnosis not present

## 2021-09-23 DIAGNOSIS — Z1212 Encounter for screening for malignant neoplasm of rectum: Secondary | ICD-10-CM | POA: Diagnosis not present

## 2021-09-23 DIAGNOSIS — R251 Tremor, unspecified: Secondary | ICD-10-CM | POA: Diagnosis not present

## 2021-09-23 DIAGNOSIS — R82998 Other abnormal findings in urine: Secondary | ICD-10-CM | POA: Diagnosis not present

## 2021-09-23 DIAGNOSIS — N1831 Chronic kidney disease, stage 3a: Secondary | ICD-10-CM | POA: Diagnosis not present

## 2021-09-23 DIAGNOSIS — I129 Hypertensive chronic kidney disease with stage 1 through stage 4 chronic kidney disease, or unspecified chronic kidney disease: Secondary | ICD-10-CM | POA: Diagnosis not present

## 2021-09-23 DIAGNOSIS — Z0000 Encounter for general adult medical examination without abnormal findings: Secondary | ICD-10-CM | POA: Diagnosis not present

## 2021-09-23 DIAGNOSIS — E785 Hyperlipidemia, unspecified: Secondary | ICD-10-CM | POA: Diagnosis not present

## 2021-09-24 NOTE — Progress Notes (Signed)
Assessment/Plan:   1.  Essential Tremor.  -The patient is status post left VIM DBS on July 07, 2019 with [facility] device.  Intention was originally to do a bilateral VIM DBS, but the patient had a vagal episode in the operating room with associated nausea and vomiting with positive CSF, and it was decided not to attempt the other side.  Ultimately, the patients other side really has not been bothersome enough to consider DBS on that side (in addition to the fact that he is now on Eliquis).    -reprogrammed dbs today  -will do CT brain to get lead positioning today   2.  History of cerebral infarct, October, 2020 and May, 2022  -MRI in October, 2020 demonstrated acute right posterior limb of the internal capsule infarct.  However, stroke neurology felt that symptoms were more consistent with a posterior circulation stroke, related to thrombus in the left subclavian (although patient certainly does have mild left facial droop today).    -MRI in 2022 with small infarct in the thalamus on the left.  TEE was negative.  Patient was already on Eliquis (had just been restarted a few weeks before, as had been stopped for pericarditis).  -On atorvastatin, 80 mg, Zetia 10 mg.  LDL slightly above goal at 99 (goal less than 70)  -Patient's current hemoglobin A1c of 7.9, goal less than 7.0  -Patient currently on amlodipine 10, losartan 100 mg, Lopressor 25 mg twice per day -long-term blood pressure goal normotensive  -Patient on baby aspirin and Eliquis.  Understands risks of both of these together.   3.  Dysphagia  -Modified barium swallow in June, 2022 was worse compared to October, 2021 with evidence of pharyngeal phase dysphagia.  4.  Hypertrophic cardiomyopathy  -Following with cardiology.     Subjective:   Joshua [name] was seen in follow-up in the movement disorder clinic for essential tremor.  Patient reports that he is having a lot of trouble with the R hand.  In regards to his  history of stroke, patient has had no further symptoms.  He had modified barium swallow since last visit.Modified barium swallow in June, 2022 was worse compared to October, 2021 with evidence of pharyngeal phase dysphagia.  Overall, patient doing okay from that regard.  Tremor is very bothersome, however.  Noting that he takes the left hand to hold the right in order to put on his glasses or do much of anything with the right hand.    Allergies  Allergen Reactions   Melatonin Other (See Comments)    dizzy    Current Outpatient Medications  Medication Instructions   amLODipine (NORVASC) 10 MG tablet 1 tablet   amLODipine (NORVASC) 10 mg, Oral, Daily at bedtime   aspirin (ASPIRIN ADULT LOW STRENGTH) 81 MG EC tablet 1 tablet   atorvastatin (LIPITOR) 80 mg, Oral, Daily-1800   colchicine 0.6 MG tablet colchicine 0.6 mg tablet  TAKE ONE TABLET BY MOUTH TWICE DAILY.   Continuous Blood Gluc Sensor (FREESTYLE LIBRE 2 SENSOR) MISC See admin instructions   cycloSPORINE (RESTASIS) 0.05 % ophthalmic emulsion Ophthalmic   DULoxetine (CYMBALTA) 60 mg, Oral, Daily   Eliquis 5 mg, Oral, 2 times daily   escitalopram (LEXAPRO) 20 mg, Oral, Daily   ezetimibe (ZETIA) 10 mg, Oral, Daily   fenofibrate 54 mg, Oral, Daily   Fish Oil 2,000 mg, Oral, Daily, Reported on 02/13/2016   furosemide (LASIX) 40 mg, Oral, As needed   insulin lispro (HUMALOG) 15-25  Units, Subcutaneous, See admin instructions, Inject 15 units subcutaneously after breakfast and 25 units after lunch and supper    losartan (COZAAR) 100 MG tablet 1 tablet, Oral, Daily   Lutein-Zeaxanthin 25-5 MG CAPS 1 tablet, Oral, Daily   metFORMIN (GLUCOPHAGE) 500 MG tablet 1 tablet with a meal   metFORMIN (GLUCOPHAGE) 500 mg, Oral, Daily with breakfast   metoprolol tartrate (LOPRESSOR) 25 mg, Oral, 2 times daily   mirtazapine (REMERON) 7.5 mg, Oral, Daily at bedtime   Multiple Vitamin (MULTIVITAMIN WITH MINERALS) TABS tablet 1 tablet, Oral, Daily    ONETOUCH VERIO test strip No dose, route, or frequency recorded.   Polyethyl Glycol-Propyl Glycol (SYSTANE OP) 1-2 drops, Both Eyes, 4 times daily PRN   PRESCRIPTION MEDICATION Inhalation, Daily at bedtime, CPAP    RESTASIS 0.05 % ophthalmic emulsion 1 drop, Both Eyes, 2 times daily PRN   SURE COMFORT PEN NEEDLES 31G X 5 MM MISC No dose, route, or frequency recorded.   Toujeo SoloStar 80 Units, Subcutaneous, 2 times daily     Objective:   VITALS:   Vitals:   09/26/21 0854  BP: (!) 145/82  Pulse: 76  SpO2: 96%  Weight: 266 lb 3.2 oz (120.7 kg)  Height: 6\' 4"  (1.93 m)    Gen:  Appears stated age and in NAD. HEENT:  Normocephalic, atraumatic. The mucous membranes are moist.     NEUROLOGICAL:  Orientation:  The patient is alert and oriented x 3.   Cranial nerves: There is good facial symmetry. Extraocular muscles are intact and visual fields are full to confrontational testing. Speech is mildly dysphasic but no dysarthria.  It is better the longer he talks (same as previous visit).  Soft palate rises symmetrically and there is no tongue deviation. Hearing is intact to conversational tone. Tone: Tone is good throughout. Motor:  5/5 in the UE/LE   MOVEMENT EXAM: Movements: With the device off, there is very severe tremor on the right.  Before programming, with the device on, the patient had moderate tremor on the right, with an ataxic component.  Post programming, patient had no tremor of the outstretched hands.  He did have very mild tremor when he approached his mouth.  I have reviewed and interpreted the following labs independently   Chemistry      Component Value Date/Time   NA 142 04/24/2021 0351   NA 141 03/26/2021 1632   K 3.6 04/24/2021 0351   CL 107 04/24/2021 0351   CO2 26 04/24/2021 0351   BUN 28 (H) 04/24/2021 0351   BUN 32 (H) 03/26/2021 1632   CREATININE 1.66 (H) 04/24/2021 0351      Component Value Date/Time   CALCIUM 9.0 04/24/2021 0351   ALKPHOS 77  04/22/2021 0054   AST 21 04/22/2021 0054   ALT 29 04/22/2021 0054   BILITOT 0.6 04/22/2021 0054   BILITOT 0.4 10/24/2020 0943      Lab Results  Component Value Date   WBC 7.8 04/23/2021   HGB 12.5 (L) 04/23/2021   HCT 36.9 (L) 04/23/2021   MCV 88.5 04/23/2021   PLT 201 04/23/2021   Lab Results  Component Value Date   TSH 1.421 01/30/2021     CC:  [name], MD

## 2021-09-26 ENCOUNTER — Other Ambulatory Visit: Payer: Self-pay

## 2021-09-26 ENCOUNTER — Ambulatory Visit (INDEPENDENT_AMBULATORY_CARE_PROVIDER_SITE_OTHER): Payer: Medicare PPO | Admitting: Neurology

## 2021-09-26 ENCOUNTER — Encounter: Payer: Self-pay | Admitting: Neurology

## 2021-09-26 VITALS — BP 145/82 | HR 76 | Ht 76.0 in | Wt 266.2 lb

## 2021-09-26 DIAGNOSIS — G250 Essential tremor: Secondary | ICD-10-CM | POA: Diagnosis not present

## 2021-09-26 DIAGNOSIS — Z9689 Presence of other specified functional implants: Secondary | ICD-10-CM | POA: Diagnosis not present

## 2021-09-26 DIAGNOSIS — R1310 Dysphagia, unspecified: Secondary | ICD-10-CM | POA: Diagnosis not present

## 2021-09-26 NOTE — Procedures (Signed)
DBS Programming was performed.    Manufacturer of DBS device: [employer] was performed.    Manufacturer of DBS device: [employer]  Total time spent programming was 45 minutes (independent of other visit time).  Device was confirmed to be on.  Soft start was confirmed to be on.  Impedences were checked and were within normal limits.  Battery was checked and was determined to be functioning normally and not near the end of life.  Final settings were as follows, with program 2 being active:   Active Contacts Amplitude (mA) PW (ms) Frequency (hz)   Left Brain       Program 1       11/28/2019 5-C+ 2.5 60 130   04/25/20 5-C+ 2.7 60 130   10/25/20 5-C+ 3.5(2.1-3.8) 60 130                               Program 2       11/28/19 5-8+ 2.2 60 130   04/25/20 5-8+ 3.2 60 130                         10/25/20 5-8+ 3.6 (3.0-4.0) 70 130   03/26/21 5-8+ 4.1 (3.0-4.4) 90 130   09/26/21 2-(40%)6-(60%)8+ 3.6 90 154 Higher frequency with tongue paresthesia                       Right Brain       Not active                        Prior visits:  Monopolar review: Left brain electrode:     1-C+           ; Amplitude  1.0   ma   ; Pulse width 60 microseconds;   Frequency   130   Hz.  (Tongue paresthesias) Left brain electrode:     (2-3-4-) 33% each C+           ; Amplitude  2.0   ma   ; Pulse width 60 microseconds;   Frequency   130   Hz.  (Tongue and lip paresthesias, fairly good tremor control) Left brain electrode:     (5-6-7-) 33% C+           ; Amplitude  2.0   ma   ; Pulse width 60 microseconds;   Frequency   130   Hz.  (Tongue paresthesias but no lip paresthesia and good tremor control) Left brain electrode:     8-C+           ; Amplitude  1.0   ma   ; Pulse width 60 microseconds;   Frequency   130   Hz.  (Paresthesias resolved)

## 2021-10-02 ENCOUNTER — Telehealth: Payer: Self-pay

## 2021-10-02 ENCOUNTER — Telehealth: Payer: Self-pay | Admitting: Neurology

## 2021-10-02 NOTE — Telephone Encounter (Signed)
Pt would like to know when the hospital will call him for his appt. There was a referral that was supposed to be sent for scans

## 2021-10-02 NOTE — Telephone Encounter (Signed)
Pt called and informed that his CT was scheduled at [facility] on Tuesday the 15th at 3:30 he needs to arrive at 3:15. Pt was thankful that we got it scheduled

## 2021-10-02 NOTE — Telephone Encounter (Signed)
Spoke with Joshua [name] and informed him that I personally talked to his insurance and that it is now been expedited in nurse review and we should her from them today or tomorrow. Pt said thank you for keeping him updated

## 2021-10-03 DIAGNOSIS — E1121 Type 2 diabetes mellitus with diabetic nephropathy: Secondary | ICD-10-CM | POA: Diagnosis not present

## 2021-10-08 ENCOUNTER — Other Ambulatory Visit: Payer: Self-pay

## 2021-10-08 ENCOUNTER — Ambulatory Visit (HOSPITAL_COMMUNITY)
Admission: RE | Admit: 2021-10-08 | Discharge: 2021-10-08 | Disposition: A | Discharge status: Home / Self Care | Payer: Medicare PPO | Source: Ambulatory Visit | Attending: Neurology | Admitting: Neurology

## 2021-10-08 DIAGNOSIS — G250 Essential tremor: Secondary | ICD-10-CM | POA: Diagnosis not present

## 2021-10-08 DIAGNOSIS — Z9889 Other specified postprocedural states: Secondary | ICD-10-CM | POA: Diagnosis not present

## 2021-10-08 DIAGNOSIS — Z969 Presence of functional implant, unspecified: Secondary | ICD-10-CM | POA: Diagnosis not present

## 2021-10-08 DIAGNOSIS — Z9689 Presence of other specified functional implants: Secondary | ICD-10-CM | POA: Insufficient documentation

## 2021-10-08 DIAGNOSIS — I6381 Other cerebral infarction due to occlusion or stenosis of small artery: Secondary | ICD-10-CM | POA: Diagnosis not present

## 2021-10-23 DIAGNOSIS — E785 Hyperlipidemia, unspecified: Secondary | ICD-10-CM | POA: Diagnosis not present

## 2021-10-23 DIAGNOSIS — I129 Hypertensive chronic kidney disease with stage 1 through stage 4 chronic kidney disease, or unspecified chronic kidney disease: Secondary | ICD-10-CM | POA: Diagnosis not present

## 2021-10-23 DIAGNOSIS — N1831 Chronic kidney disease, stage 3a: Secondary | ICD-10-CM | POA: Diagnosis not present

## 2021-10-23 DIAGNOSIS — E1129 Type 2 diabetes mellitus with other diabetic kidney complication: Secondary | ICD-10-CM | POA: Diagnosis not present

## 2021-11-02 DIAGNOSIS — E1121 Type 2 diabetes mellitus with diabetic nephropathy: Secondary | ICD-10-CM | POA: Diagnosis not present

## 2021-11-04 ENCOUNTER — Ambulatory Visit: Payer: Medicare PPO | Admitting: Podiatry

## 2021-11-07 ENCOUNTER — Ambulatory Visit (INDEPENDENT_AMBULATORY_CARE_PROVIDER_SITE_OTHER): Payer: Medicare PPO | Admitting: Podiatry

## 2021-11-07 ENCOUNTER — Other Ambulatory Visit: Payer: Self-pay

## 2021-11-07 DIAGNOSIS — B351 Tinea unguium: Secondary | ICD-10-CM | POA: Diagnosis not present

## 2021-11-07 DIAGNOSIS — E11621 Type 2 diabetes mellitus with foot ulcer: Secondary | ICD-10-CM

## 2021-11-07 DIAGNOSIS — M79675 Pain in left toe(s): Secondary | ICD-10-CM

## 2021-11-07 DIAGNOSIS — L97529 Non-pressure chronic ulcer of other part of left foot with unspecified severity: Secondary | ICD-10-CM | POA: Diagnosis not present

## 2021-11-07 DIAGNOSIS — Z7901 Long term (current) use of anticoagulants: Secondary | ICD-10-CM

## 2021-11-07 DIAGNOSIS — M79674 Pain in right toe(s): Secondary | ICD-10-CM | POA: Diagnosis not present

## 2021-11-07 NOTE — Progress Notes (Signed)
Subjective: 70 year old male presents the office today for diabetic foot evaluation and for elongated nails that he cannot trim himself as well as calluses.  He said the calluses have been doing well.  Has been using a pumice stone to help with this.  No open sores that he reports.  No swelling redness or any drainage.   Last A1c was 7.9 on Apr 21, 2021 [name], MD  Objective: AAO x3, NAD DP/PT pulses palpable bilaterally, CRT less than 3 seconds Sensation decreased with Semmes Weinstein monofilament  Nails are hypertrophic, dystrophic, brittle, discolored, elongated 10. No surrounding redness or drainage. Tenderness nails 1-5 bilaterally.  Cavus foot type is present resulting in plantarflexed first ray and prominent fifth metatarsal head.  No significant hyperkeratotic tissue today.  There is no open lesions bilaterally. 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  -Sharply debrided the toenails x10 without any complications or bleeding -Continue moisturizer and offloading for the calluses. -Glucose control  -Patient encouraged to call the office with any questions, concerns, change in symptoms.   RTC 3 months  Trula Slade DPM

## 2021-11-08 NOTE — Progress Notes (Signed)
Assessment/Plan:   1.  Essential Tremor.  -The patient is status post left VIM DBS on July 07, 2019 with [facility] device.  Intention was originally to do a bilateral VIM DBS, but the patient had a vagal episode in the operating room with associated nausea and vomiting with positive CSF, and it was decided not to attempt the other side.    -reprogrammed dbs today  -He asks me about potentially doing focused ultrasound for the other side of the brain.  I told the patient that I really would not recommend it in him.  Besides for the fact he is on Eliquis, the bigger issue is he already has significant balance trouble and focused ultrasound can certainly for off balance.   2.  History of cerebral infarct, October, 2020 and May, 2022  -MRI in October, 2020 demonstrated acute right posterior limb of the internal capsule infarct.  However, stroke neurology felt that symptoms were more consistent with a posterior circulation stroke, related to thrombus in the left subclavian (although patient certainly does have mild left facial droop today).    -MRI in 2022 with small infarct in the thalamus on the left.  TEE was negative.  Patient was already on Eliquis (had just been restarted a few weeks before, as had been stopped for pericarditis).  -On atorvastatin, 80 mg, Zetia 10 mg.  LDL slightly above goal at 99 (goal less than 70)  -Patient's current hemoglobin A1c of 7.9, goal less than 7.0  -Patient currently on amlodipine 10, losartan 100 mg, Lopressor 25 mg twice per day -long-term blood pressure goal normotensive  -Patient on baby aspirin and Eliquis.  Understands risks of both of these together.   3.  Dysphagia  -Modified barium swallow in June, 2022 was worse compared to October, 2021 with evidence of pharyngeal phase dysphagia.  4.  Hypertrophic cardiomyopathy  -Following with cardiology.     Subjective:   Joshua [name] was seen in follow-up in the movement disorder clinic for  essential tremor.  Last visit, we reprogrammed his device fairly significantly and he reports that he initially did better, but now is much worse again.  We also did a CT brain to help with programming.  CT brain was nonacute.  It has been reviewed.    Allergies  Allergen Reactions   Melatonin Other (See Comments)    dizzy    Current Outpatient Medications  Medication Instructions   amLODipine (NORVASC) 10 MG tablet 1 tablet   amLODipine (NORVASC) 10 mg, Oral, Daily at bedtime   aspirin 81 MG EC tablet 1 tablet   atorvastatin (LIPITOR) 80 mg, Oral, Daily-1800   colchicine 0.6 MG tablet colchicine 0.6 mg tablet  TAKE ONE TABLET BY MOUTH TWICE DAILY.   Continuous Blood Gluc Sensor (FREESTYLE LIBRE 2 SENSOR) MISC See admin instructions   cycloSPORINE (RESTASIS) 0.05 % ophthalmic emulsion Ophthalmic   DULoxetine (CYMBALTA) 60 mg, Oral, Daily   Eliquis 5 mg, Oral, 2 times daily   escitalopram (LEXAPRO) 20 mg, Oral, Daily   ezetimibe (ZETIA) 10 mg, Oral, Daily   fenofibrate 54 mg, Oral, Daily   Fish Oil 2,000 mg, Oral, Daily, Reported on 02/13/2016   furosemide (LASIX) 40 mg, Oral, As needed   insulin lispro (HUMALOG) 15-25 Units, Subcutaneous, See admin instructions, Inject 15 units subcutaneously after breakfast and 25 units after lunch and supper    losartan (COZAAR) 100 MG tablet 1 tablet, Oral, Daily   Lutein-Zeaxanthin 25-5 MG CAPS 1 tablet, Oral,  Daily   metFORMIN (GLUCOPHAGE) 500 MG tablet 1 tablet with a meal   metFORMIN (GLUCOPHAGE) 500 mg, Oral, Daily with breakfast   metoprolol tartrate (LOPRESSOR) 25 mg, Oral, 2 times daily   mirtazapine (REMERON) 7.5 mg, Oral, Daily at bedtime   Multiple Vitamin (MULTIVITAMIN WITH MINERALS) TABS tablet 1 tablet, Oral, Daily   ONETOUCH VERIO test strip No dose, route, or frequency recorded.   Polyethyl Glycol-Propyl Glycol (SYSTANE OP) 1-2 drops, Both Eyes, 4 times daily PRN   PRESCRIPTION MEDICATION Inhalation, Daily at bedtime, CPAP     RESTASIS 0.05 % ophthalmic emulsion 1 drop, Both Eyes, 2 times daily PRN   SURE COMFORT PEN NEEDLES 31G X 5 MM MISC No dose, route, or frequency recorded.   Toujeo SoloStar 80 Units, Subcutaneous, 2 times daily     Objective:   VITALS:   Vitals:   11/11/21 1437  BP: 123/72  Pulse: 62  SpO2: 91%  Weight: 264 lb 9.6 oz (120 kg)  Height: 6\' 3"  (1.905 m)     Gen:  Appears stated age and in NAD. HEENT:  Normocephalic, atraumatic. The mucous membranes are moist.     NEUROLOGICAL:  Orientation:  The patient is alert and oriented x 3.   Cranial nerves: There is good facial symmetry. Extraocular muscles are intact and visual fields are full to confrontational testing. Speech is mildly dysphasic but no dysarthria.  It is better the longer he talks (same as previous visit).  Soft palate rises symmetrically and there is no tongue deviation. Hearing is intact to conversational tone. Tone: Tone is good throughout. Motor:  5/5 in the UE/LE   MOVEMENT EXAM: Movements: With the device off, there is very severe tremor on the right.  Before programming, with the device on, the patient had moderate tremor on the right, with an ataxic component.  Post programming, patient had no tremor of the outstretched hands.  He did have very mild tremor when he approached his mouth.  He does have ataxic tremor when he goes to write.  I have reviewed and interpreted the following labs independently   Chemistry      Component Value Date/Time   NA 142 04/24/2021 0351   NA 141 03/26/2021 1632   K 3.6 04/24/2021 0351   CL 107 04/24/2021 0351   CO2 26 04/24/2021 0351   BUN 28 (H) 04/24/2021 0351   BUN 32 (H) 03/26/2021 1632   CREATININE 1.66 (H) 04/24/2021 0351      Component Value Date/Time   CALCIUM 9.0 04/24/2021 0351   ALKPHOS 77 04/22/2021 0054   AST 21 04/22/2021 0054   ALT 29 04/22/2021 0054   BILITOT 0.6 04/22/2021 0054   BILITOT 0.4 10/24/2020 0943      Lab Results  Component Value Date    WBC 7.8 04/23/2021   HGB 12.5 (L) 04/23/2021   HCT 36.9 (L) 04/23/2021   MCV 88.5 04/23/2021   PLT 201 04/23/2021   Lab Results  Component Value Date   TSH 1.421 01/30/2021     CC:  [name], MD

## 2021-11-11 ENCOUNTER — Other Ambulatory Visit: Payer: Self-pay

## 2021-11-11 ENCOUNTER — Encounter: Payer: Self-pay | Admitting: Neurology

## 2021-11-11 ENCOUNTER — Ambulatory Visit: Payer: Medicare PPO | Admitting: Neurology

## 2021-11-11 VITALS — BP 123/72 | HR 62 | Ht 75.0 in | Wt 264.6 lb

## 2021-11-11 DIAGNOSIS — G250 Essential tremor: Secondary | ICD-10-CM | POA: Diagnosis not present

## 2021-11-11 DIAGNOSIS — R270 Ataxia, unspecified: Secondary | ICD-10-CM

## 2021-11-11 NOTE — Procedures (Signed)
DBS Programming was performed.    Manufacturer of DBS device: [employer] was performed.    Manufacturer of DBS device: [employer]  Total time spent programming was 40 minutes (independent of other visit time).  Device was confirmed to be on.  Soft start was confirmed to be on.  Impedences were checked and were within normal limits.  Battery was checked and was determined to be functioning normally and not near the end of life.  Final settings were as follows, with program 2 being active:   Active Contacts Amplitude (mA) PW (ms) Frequency (hz)   Left Brain       Program 1       11/28/2019 5-C+ 2.5 60 130   04/25/20 5-C+ 2.7 60 130   10/25/20 5-C+ 3.5(2.1-3.8) 60 130                               Program 2       11/28/19 5-8+ 2.2 60 130   04/25/20 5-8+ 3.2 60 130                         10/25/20 5-8+ 3.6 (3.0-4.0) 70 130   03/26/21 5-8+ 4.1 (3.0-4.4) 90 130   09/26/21 2-(40%)6-(60%)8+ 3.6 90 154 Higher frequency with tongue paresthesia  11/11/21 6-(50%)7-(50%)8+ 3.8 100 170                                             Right Brain       Not active                        Prior visits:  Monopolar review: Left brain electrode:     1-C+           ; Amplitude  1.0   ma   ; Pulse width 60 microseconds;   Frequency   130   Hz.  (Tongue paresthesias) Left brain electrode:     (2-3-4-) 33% each C+           ; Amplitude  2.0   ma   ; Pulse width 60 microseconds;   Frequency   130   Hz.  (Tongue and lip paresthesias, fairly good tremor control) Left brain electrode:     (5-6-7-) 33% C+           ; Amplitude  2.0   ma   ; Pulse width 60 microseconds;   Frequency   130   Hz.  (Tongue paresthesias but no lip paresthesia and good tremor control) Left brain electrode:     8-C+           ; Amplitude  1.0   ma   ; Pulse width 60 microseconds;   Frequency   130   Hz.  (Paresthesias resolved)

## 2021-11-12 DIAGNOSIS — H04123 Dry eye syndrome of bilateral lacrimal glands: Secondary | ICD-10-CM | POA: Diagnosis not present

## 2021-11-12 DIAGNOSIS — H40013 Open angle with borderline findings, low risk, bilateral: Secondary | ICD-10-CM | POA: Diagnosis not present

## 2021-11-14 ENCOUNTER — Ambulatory Visit: Payer: Medicare PPO | Admitting: Cardiology

## 2021-11-22 DIAGNOSIS — E785 Hyperlipidemia, unspecified: Secondary | ICD-10-CM | POA: Diagnosis not present

## 2021-11-22 DIAGNOSIS — E1129 Type 2 diabetes mellitus with other diabetic kidney complication: Secondary | ICD-10-CM | POA: Diagnosis not present

## 2021-11-22 DIAGNOSIS — N1831 Chronic kidney disease, stage 3a: Secondary | ICD-10-CM | POA: Diagnosis not present

## 2021-11-22 DIAGNOSIS — I129 Hypertensive chronic kidney disease with stage 1 through stage 4 chronic kidney disease, or unspecified chronic kidney disease: Secondary | ICD-10-CM | POA: Diagnosis not present

## 2021-11-26 ENCOUNTER — Telehealth: Payer: Self-pay | Admitting: Neurology

## 2021-11-26 NOTE — Telephone Encounter (Signed)
Patient said he was shaking uncontrollably  and needs an appt beofre march. He was seen in December 2022. Would like a call back

## 2021-11-27 NOTE — Telephone Encounter (Signed)
Called patient and he has been having symptoms since last appt . Patient stated the shaking has not stopped and he is shaking all day

## 2021-11-28 NOTE — Telephone Encounter (Signed)
Called patient to relay message we are working to get a DBS rep into the office to help patient to adjust DBS

## 2021-12-02 DIAGNOSIS — L570 Actinic keratosis: Secondary | ICD-10-CM | POA: Diagnosis not present

## 2021-12-02 DIAGNOSIS — C44229 Squamous cell carcinoma of skin of left ear and external auricular canal: Secondary | ICD-10-CM | POA: Diagnosis not present

## 2021-12-02 DIAGNOSIS — D485 Neoplasm of uncertain behavior of skin: Secondary | ICD-10-CM | POA: Diagnosis not present

## 2021-12-03 DIAGNOSIS — E1121 Type 2 diabetes mellitus with diabetic nephropathy: Secondary | ICD-10-CM | POA: Diagnosis not present

## 2021-12-03 NOTE — Telephone Encounter (Signed)
Patient called back and confirmed that he will be here at 1:00 pm to meet with the DBS rep I did tell patient this is not an appointment with Dr. Carles Collet but just an adjustment with the DBS rep

## 2021-12-03 NOTE — Telephone Encounter (Signed)
Called patient about meeting the DBS rep at 1:00 pm on Thursday. I was unable to reach him left voicemail to please call me back to verify if he will be able to make it on that day and time

## 2021-12-16 ENCOUNTER — Telehealth: Payer: Self-pay | Admitting: Neurology

## 2021-12-16 NOTE — Telephone Encounter (Signed)
Contacted patient and let him know to reach out to Neurosurgery about the wiring and make sure it is intact

## 2021-12-16 NOTE — Telephone Encounter (Signed)
Patient stated he had a fall this weekend and cut his head near wires. Wants it checked out.

## 2021-12-19 DIAGNOSIS — J029 Acute pharyngitis, unspecified: Secondary | ICD-10-CM | POA: Diagnosis not present

## 2021-12-19 DIAGNOSIS — Z1152 Encounter for screening for COVID-19: Secondary | ICD-10-CM | POA: Diagnosis not present

## 2021-12-19 DIAGNOSIS — I129 Hypertensive chronic kidney disease with stage 1 through stage 4 chronic kidney disease, or unspecified chronic kidney disease: Secondary | ICD-10-CM | POA: Diagnosis not present

## 2021-12-19 DIAGNOSIS — R599 Enlarged lymph nodes, unspecified: Secondary | ICD-10-CM | POA: Diagnosis not present

## 2021-12-22 DIAGNOSIS — E1129 Type 2 diabetes mellitus with other diabetic kidney complication: Secondary | ICD-10-CM | POA: Diagnosis not present

## 2021-12-22 DIAGNOSIS — I129 Hypertensive chronic kidney disease with stage 1 through stage 4 chronic kidney disease, or unspecified chronic kidney disease: Secondary | ICD-10-CM | POA: Diagnosis not present

## 2021-12-22 DIAGNOSIS — N1831 Chronic kidney disease, stage 3a: Secondary | ICD-10-CM | POA: Diagnosis not present

## 2021-12-22 DIAGNOSIS — E785 Hyperlipidemia, unspecified: Secondary | ICD-10-CM | POA: Diagnosis not present

## 2021-12-31 DIAGNOSIS — I6990 Unspecified sequelae of unspecified cerebrovascular disease: Secondary | ICD-10-CM | POA: Diagnosis not present

## 2021-12-31 DIAGNOSIS — E1121 Type 2 diabetes mellitus with diabetic nephropathy: Secondary | ICD-10-CM | POA: Diagnosis not present

## 2021-12-31 DIAGNOSIS — Z7901 Long term (current) use of anticoagulants: Secondary | ICD-10-CM | POA: Diagnosis not present

## 2021-12-31 DIAGNOSIS — Z86711 Personal history of pulmonary embolism: Secondary | ICD-10-CM | POA: Diagnosis not present

## 2021-12-31 DIAGNOSIS — F325 Major depressive disorder, single episode, in full remission: Secondary | ICD-10-CM | POA: Diagnosis not present

## 2021-12-31 DIAGNOSIS — I129 Hypertensive chronic kidney disease with stage 1 through stage 4 chronic kidney disease, or unspecified chronic kidney disease: Secondary | ICD-10-CM | POA: Diagnosis not present

## 2021-12-31 DIAGNOSIS — N1831 Chronic kidney disease, stage 3a: Secondary | ICD-10-CM | POA: Diagnosis not present

## 2021-12-31 DIAGNOSIS — I422 Other hypertrophic cardiomyopathy: Secondary | ICD-10-CM | POA: Diagnosis not present

## 2022-01-03 DIAGNOSIS — E1121 Type 2 diabetes mellitus with diabetic nephropathy: Secondary | ICD-10-CM | POA: Diagnosis not present

## 2022-01-16 ENCOUNTER — Encounter (HOSPITAL_BASED_OUTPATIENT_CLINIC_OR_DEPARTMENT_OTHER): Payer: Self-pay

## 2022-01-16 ENCOUNTER — Other Ambulatory Visit: Payer: Self-pay

## 2022-01-16 ENCOUNTER — Emergency Department (HOSPITAL_BASED_OUTPATIENT_CLINIC_OR_DEPARTMENT_OTHER)
Admission: EM | Admit: 2022-01-16 | Discharge: 2022-01-16 | Disposition: A | Discharge status: Home / Self Care | Payer: No Typology Code available for payment source | Attending: Emergency Medicine | Admitting: Emergency Medicine

## 2022-01-16 ENCOUNTER — Emergency Department (HOSPITAL_BASED_OUTPATIENT_CLINIC_OR_DEPARTMENT_OTHER): Payer: No Typology Code available for payment source

## 2022-01-16 DIAGNOSIS — J029 Acute pharyngitis, unspecified: Secondary | ICD-10-CM | POA: Diagnosis present

## 2022-01-16 DIAGNOSIS — Z7901 Long term (current) use of anticoagulants: Secondary | ICD-10-CM | POA: Insufficient documentation

## 2022-01-16 DIAGNOSIS — Z794 Long term (current) use of insulin: Secondary | ICD-10-CM | POA: Diagnosis not present

## 2022-01-16 DIAGNOSIS — R221 Localized swelling, mass and lump, neck: Secondary | ICD-10-CM | POA: Diagnosis not present

## 2022-01-16 DIAGNOSIS — K1379 Other lesions of oral mucosa: Secondary | ICD-10-CM | POA: Diagnosis not present

## 2022-01-16 DIAGNOSIS — I10 Essential (primary) hypertension: Secondary | ICD-10-CM | POA: Insufficient documentation

## 2022-01-16 DIAGNOSIS — Z7984 Long term (current) use of oral hypoglycemic drugs: Secondary | ICD-10-CM | POA: Diagnosis not present

## 2022-01-16 DIAGNOSIS — E119 Type 2 diabetes mellitus without complications: Secondary | ICD-10-CM | POA: Insufficient documentation

## 2022-01-16 DIAGNOSIS — Z7982 Long term (current) use of aspirin: Secondary | ICD-10-CM | POA: Insufficient documentation

## 2022-01-16 DIAGNOSIS — J0510 Acute epiglottitis without obstruction: Secondary | ICD-10-CM | POA: Diagnosis not present

## 2022-01-16 LAB — CBC WITH DIFFERENTIAL/PLATELET
Abs Immature Granulocytes: 0.05 10*3/uL (ref 0.00–0.07)
Basophils Absolute: 0 10*3/uL (ref 0.0–0.1)
Basophils Relative: 0 %
Eosinophils Absolute: 0.4 10*3/uL (ref 0.0–0.5)
Eosinophils Relative: 4 %
HCT: 40 % (ref 39.0–52.0)
Hemoglobin: 13.2 g/dL (ref 13.0–17.0)
Immature Granulocytes: 1 %
Lymphocytes Relative: 33 %
Lymphs Abs: 3.3 10*3/uL (ref 0.7–4.0)
MCH: 29.2 pg (ref 26.0–34.0)
MCHC: 33 g/dL (ref 30.0–36.0)
MCV: 88.5 fL (ref 80.0–100.0)
Monocytes Absolute: 0.9 10*3/uL (ref 0.1–1.0)
Monocytes Relative: 10 %
Neutro Abs: 5.2 10*3/uL (ref 1.7–7.7)
Neutrophils Relative %: 52 %
Platelets: 229 10*3/uL (ref 150–400)
RBC: 4.52 MIL/uL (ref 4.22–5.81)
RDW: 13.9 % (ref 11.5–15.5)
WBC: 9.9 10*3/uL (ref 4.0–10.5)
nRBC: 0 % (ref 0.0–0.2)

## 2022-01-16 LAB — BASIC METABOLIC PANEL
Anion gap: 9 (ref 5–15)
BUN: 23 mg/dL (ref 8–23)
CO2: 27 mmol/L (ref 22–32)
Calcium: 9.2 mg/dL (ref 8.9–10.3)
Chloride: 106 mmol/L (ref 98–111)
Creatinine, Ser: 1.59 mg/dL — ABNORMAL HIGH (ref 0.61–1.24)
GFR, Estimated: 46 mL/min — ABNORMAL LOW (ref >60)
Glucose, Bld: 169 mg/dL — ABNORMAL HIGH (ref 70–99)
Potassium: 4 mmol/L (ref 3.5–5.1)
Sodium: 142 mmol/L (ref 135–145)

## 2022-01-16 MED ORDER — IOHEXOL 300 MG/ML  SOLN
75.0000 mL | Freq: Once | INTRAMUSCULAR | Status: AC | PRN
  Administered 2022-01-16: 75 mL via INTRAVENOUS

## 2022-01-16 MED ORDER — CLINDAMYCIN HCL 150 MG PO CAPS: 150.0000 mg | ORAL_CAPSULE | Freq: Four times a day (QID) | ORAL | 0 refills | Status: DC

## 2022-01-16 NOTE — Discharge Instructions (Signed)
You have been evaluated for your sore throat.  There is a lesion noted to the right side of your neck which is likely the source of your discomfort.  This could be an underlying infection so therefore please take antibiotic prescribed.  Please note this antibiotic certainly increase the risk of antibiotic related diarrhea, therefore please eat yogurt with probiotic daily while taking this antibiotic to decrease risk of diarrhea.  It is very important for you to call and follow-up closely with an ear nose and throat specialist for further assessment of your condition as this lesion could also be related to a tumor.  Return to the ER if you have any concerns.

## 2022-01-16 NOTE — ED Provider Notes (Signed)
[facility] EMERGENCY DEPT Provider Note   CSN: [id number] Arrival date & time: 01/16/22  1004     History  No chief complaint on file.   Joshua [name] is a 71 y.o. male.  The history is provided by the patient. No language interpreter was used.   71 year old male significant history of prior stroke, hypertension, GERD, diabetes, obesity, coagulopathy currently on Eliquis presenting complaining of sore throat.  Patient reports 3 weeks ago he developed some sore throat and pain to the right side of his neck.  He went to see his primary care provider, states he had strep and COVID test obtained which came back negative.  Patient was prescribed steroid as well as antibiotic that he took for 10 days.  States that it did help with his symptoms however last night he noted pain and swelling to the right side of his neck that felt somewhat similar to the recent episode.  He described the discomfort as pain when he swallow but denies any significant sore throat.  Denies having fever, headache, ear pain, chest pain, trouble breathing, shortness of breath or abdominal pain.  No history of active cancer.  He did try to reach out to his PCP but they recommend patient to come to the ER for further assessment.  Patient denies any hives, itchiness, recent change in environment food or drugs.  He denies any change in his voice  Home Medications Prior to Admission medications   Medication Sig Start Date End Date Taking? Authorizing Provider  amLODipine (NORVASC) 10 MG tablet Take 1 tablet (10 mg total) by mouth at bedtime. 10/31/20   Donato Heinz, MD  amLODipine (NORVASC) 10 MG tablet 1 tablet    [provider]  apixaban (ELIQUIS) 5 MG TABS tablet Take 1 tablet (5 mg total) by mouth 2 (two) times daily. 03/26/21   Donato Heinz, MD  aspirin 81 MG EC tablet 1 tablet 04/23/21   [provider]  atorvastatin (LIPITOR) 80 MG tablet Take 1 tablet (80 mg total)  by mouth daily at 6 PM. 08/31/19   Danford, Suann Larry, MD  colchicine 0.6 MG tablet colchicine 0.6 mg tablet  TAKE ONE TABLET BY MOUTH TWICE DAILY.    [provider]  Continuous Blood Gluc Sensor (FREESTYLE LIBRE 2 SENSOR) MISC See admin instructions.    [provider]  cycloSPORINE (RESTASIS) 0.05 % ophthalmic emulsion Apply to eye. 01/29/21   [provider]  DULoxetine (CYMBALTA) 60 MG capsule Take 60 mg by mouth daily.    [provider]  escitalopram (LEXAPRO) 20 MG tablet Take 20 mg by mouth daily. 03/25/20   [provider]  ezetimibe (ZETIA) 10 MG tablet Take 1 tablet (10 mg total) by mouth daily. 02/26/21 05/27/21  Donato Heinz, MD  fenofibrate 54 MG tablet Take 54 mg by mouth daily.    [provider]  furosemide (LASIX) 40 MG tablet Take 1 tablet (40 mg total) by mouth as needed (Weight increase of 3 pounds in 1 day or 5 pounds in 1 week.). 03/26/21   Donato Heinz, MD  Insulin Glargine, 1 Unit Dial, (TOUJEO SOLOSTAR) 300 UNIT/ML SOPN Inject 80 Units into the skin 2 (two) times daily.    [provider]  insulin lispro (HUMALOG) 100 UNIT/ML KwikPen Inject 15-25 Units into the skin See admin instructions. Inject 15 units subcutaneously after breakfast and 25 units after lunch and supper    [provider]  losartan (COZAAR) 100  MG tablet Take 1 tablet by mouth daily. 03/25/20   [provider]  Lutein-Zeaxanthin 25-5 MG CAPS Take 1 tablet by mouth daily.    [provider]  metFORMIN (GLUCOPHAGE) 500 MG tablet Take 500 mg by mouth daily with breakfast. 03/25/20   [provider]  metFORMIN (GLUCOPHAGE) 500 MG tablet 1 tablet with a meal    [provider]  metoprolol tartrate (LOPRESSOR) 25 MG tablet Take 1 tablet (25 mg total) by mouth 2 (two) times daily. 04/09/21   Donato Heinz, MD  mirtazapine (REMERON) 7.5 MG tablet Take 7.5 mg by mouth at bedtime.     [provider]  Multiple Vitamin (MULTIVITAMIN WITH MINERALS) TABS tablet Take 1 tablet by mouth daily.    [provider]  Omega-3 Fatty Acids (FISH OIL) 1000 MG CAPS Take 2,000 mg by mouth daily. Reported on 02/13/2016    [provider]  [facility] VERIO test strip  09/13/18   [provider]  Polyethyl Glycol-Propyl Glycol (SYSTANE OP) Place 1-2 drops into both eyes 4 (four) times daily as needed (dry eyes).     [provider]  PRESCRIPTION MEDICATION Inhale into the lungs at bedtime. CPAP    [provider]  RESTASIS 0.05 % ophthalmic emulsion Place 1 drop into both eyes 2 (two) times daily as needed (dry eyes). 01/29/21   [provider]  SURE COMFORT PEN NEEDLES 31G X 5 MM [place]  09/12/19   [provider]      Allergies    Melatonin    Review of Systems   Review of Systems  All other systems reviewed and are negative.  Physical Exam Updated Vital Signs BP 121/77 (BP Location: Right Arm)    Pulse 83    Temp 99 F (37.2 C) (Oral)    Resp 18    Ht 6\' 4"  (1.93 m)    Wt 113.4 kg    SpO2 100%    BMI 30.43 kg/m  Physical Exam Vitals and nursing note reviewed.  Constitutional:      General: He is not in acute distress.    Appearance: He is well-developed. He is obese.  HENT:     Head: Atraumatic.     Right Ear: Tympanic membrane normal.     Left Ear: Tympanic membrane normal.     Nose: Nose normal.     Mouth/Throat:     Mouth: Mucous membranes are moist.     Pharynx: Oropharynx is clear. No oropharyngeal exudate or posterior oropharyngeal erythema.     Comments: Throat exam unremarkable, no edema noted. Eyes:     Extraocular Movements: Extraocular movements intact.     Conjunctiva/sclera: Conjunctivae normal.     Pupils: Pupils are equal, round, and reactive to light.  Musculoskeletal:     Cervical back: Normal range of motion and neck supple. Tenderness (Tenderness noted to right submandibular and anterior  neck on palpation without appreciable lymphadenopathy or swelling noted.) present.  Skin:    Findings: No rash.  Neurological:     Mental Status: He is alert.    ED Results / Procedures / Treatments   Labs (all labs ordered are listed, but only abnormal results are displayed) Labs Reviewed  BASIC METABOLIC PANEL - Abnormal; Notable for the following components:      Result Value   Glucose, Bld 169 (*)    Creatinine, Ser 1.59 (*)    GFR, Estimated 46 (*)    All other components within normal  limits  CBC WITH DIFFERENTIAL/PLATELET    EKG None  Radiology CT Soft Tissue Neck W Contrast  Result Date: 01/16/2022 CLINICAL DATA:  Epiglottitis or tonsillitis suspected; right side neck pain EXAM: CT NECK WITH CONTRAST TECHNIQUE: Multidetector CT imaging of the neck was performed using the standard protocol following the bolus administration of intravenous contrast. RADIATION DOSE REDUCTION: This exam was performed according to the departmental dose-optimization program which includes automated exposure control, adjustment of the mA and/or kV according to patient size and/or use of iterative reconstruction technique. CONTRAST:  90mL OMNIPAQUE IOHEXOL 300 MG/ML  SOLN COMPARISON:  Prior CTA neck FINDINGS: Pharynx and larynx: At the floor of mouth, there is a heterogeneous lesion present. Involves the right genioglossus with a low-density area measuring 1.7 x 0.8 cm and a more heterogeneous, primarily enhancing area just inferiorly measuring 1.7 x 2.5 cm. Together with the spans about 1.9 cm craniocaudal. Asymmetric thickening of the right aspect of the epiglottis and asymmetric effacement of the right vallecula. Salivary glands: Parotid and submandibular glands are unremarkable. Thyroid: Normal. Lymph nodes: No enlarged or abnormal density nodes. Vascular: Major neck vessels are patent. Calcified plaque at the common carotid bifurcations. Partially retropharyngeal course of the left ICA. Limited  intracranial: No abnormal enhancement. Visualized orbits: No acute abnormality. Small focus of calcification along the left lower lid. Mastoids and visualized paranasal sinuses: No significant opacification. Skeleton: Cervical spine degenerative changes. Upper chest: Right chest wall stimulator generator with lead extending to the head and out of field of view. Included upper lungs are clear. Other: None. IMPRESSION: Heterogeneous, partially cystic or necrotic floor of mouth lesion as described. There is asymmetric thickening of the right aspect of the epiglottis and asymmetric effacement of the right vallecula, which could reflect contiguous extension of above process. No adenopathy. Inflammatory process possible given history, but there is no substantial edema. Therefore neoplastic process must be excluded. Electronically Signed   By: Macy Mis M.D.   On: 01/16/2022 14:03    Procedures Procedures    Medications Ordered in ED Medications - No data to display  ED Course/ Medical Decision Making/ A&P                           Medical Decision Making Amount and/or Complexity of Data Reviewed Labs: ordered. Radiology: ordered.  Risk Prescription drug management.   BP 121/77 (BP Location: Right Arm)    Pulse 83    Temp 99 F (37.2 C) (Oral)    Resp 18    Ht 6\' 4"  (1.93 m)    Wt 113.4 kg    SpO2 100%    BMI 30.43 kg/m   11:45 AM Patient presents complaining of sore throat since yesterday.  Symptoms felt similar to another prior episode 3 weeks ago when he was seen by his PCP had negative strep and COVID test and was given antibiotic and steroids which did provide relief while he was on it.  His symptoms since returned.  Patient overall well-appearing, normal phonation, no airway compromise, throat exam unremarkable.  He does have some tenderness to right side of his neck without any erythema edema or warmth appreciated.  Would obtain neck soft tissue CT for further assessment.  Since  patient has had patient strep COVID and flu test done for the same complaint previously and which came back negative, will defer rechecking these tests.  2:17 PM Labs obtained and independently reviewed interpreted by me.  Labs are  mostly at patient's baseline.  Mildly elevated CBG of 169, creatinine is 1.59, similar to baseline as well.  Normal WBC, normal H&H.  CT scan of the neck soft tissue demonstrates heterogeneous, partially cystic some necrotic floor of mouth lesion that extends towards the right vallecula and the right aspect of the epiglottis with asymmetric effacement.  No adenopathy noted.  Radiologist felt that this is inflammatory process given the history but neoplastic process must be excluded.  I discussed care with Dr. Zenia Resides, we felt would be reasonable to place patient on clindamycin antibiotic in the event that this is an infectious process but also have patient follow-up closely with ENT for further assessment and to rule out malignancy.  Patient voiced understanding and agrees with plan.  He does not have any signs of airway compromise.  Patient is stable for discharge.  Return precaution given.  This patient presents to the ED for concern of sore throat, this involves an extensive number of treatment options, and is a complaint that carries with it a high risk of complications and morbidity.  The differential diagnosis includes pharyngitis, RPA, PTA, Ludwigs angina, Lemierre's disease, malignancy, odontogenic abscess, thyromegaly, vascular pseudoaneurysm, cellulitis  Co morbidities that complicate the patient evaluation age  Remote hx of tobacco use  GERD  DM Additional history obtained:   External records from outside source obtained and reviewed including notes from neurology and cardiology  Lab Tests:  I Ordered, and personally interpreted labs.  The pertinent results include:  as above  Imaging Studies ordered:  I ordered imaging studies including neck soft tissue  CT I independently visualized and interpreted imaging which showed abnormal lesion to R side of neck I agree with the radiologist interpretation  Cardiac Monitoring:  The patient was maintained on a cardiac monitor.  I personally viewed and interpreted the cardiac monitored which showed an underlying rhythm of: NSR  Medicines ordered and prescription drug management:   Test Considered: as above  Critical Interventions: CT soft tissue neck   Problem List / ED Course: sore throat  Reevaluation:  After the interventions noted above, I reevaluated the patient and found that they have :stayed the same  Social Determinants of Health: age  Dispostion:  After consideration of the diagnostic results and the patients response to treatment, I feel that the patent would benefit from outpt f/u with ENT.         Final Clinical Impression(s) / ED Diagnoses Final diagnoses:  Sore throat  Mass of right side of neck    Rx / DC Orders ED Discharge Orders          Ordered    clindamycin (CLEOCIN) 150 MG capsule  Every 6 hours        01/16/22 1425              Domenic Moras, PA-C 01/16/22 1429    Lacretia Leigh, MD 01/17/22 1524

## 2022-01-16 NOTE — ED Triage Notes (Signed)
Pt came in POV c/o of sore/swelling throat that started yesterday and progressing today. Pt denies SHOB or difficulty breathing. Pt previous has had a hx for enlarged glands in his neck that lead to him unable to swallow. Pt was given medications to relieve the swelling, but he feels as if it is starting again.

## 2022-01-20 DIAGNOSIS — J029 Acute pharyngitis, unspecified: Secondary | ICD-10-CM | POA: Diagnosis not present

## 2022-01-20 DIAGNOSIS — Z974 Presence of external hearing-aid: Secondary | ICD-10-CM | POA: Diagnosis not present

## 2022-01-20 DIAGNOSIS — J343 Hypertrophy of nasal turbinates: Secondary | ICD-10-CM | POA: Diagnosis not present

## 2022-01-20 DIAGNOSIS — Z87891 Personal history of nicotine dependence: Secondary | ICD-10-CM | POA: Diagnosis not present

## 2022-01-20 DIAGNOSIS — Z9889 Other specified postprocedural states: Secondary | ICD-10-CM | POA: Diagnosis not present

## 2022-01-20 DIAGNOSIS — J3489 Other specified disorders of nose and nasal sinuses: Secondary | ICD-10-CM | POA: Diagnosis not present

## 2022-01-24 DIAGNOSIS — B369 Superficial mycosis, unspecified: Secondary | ICD-10-CM | POA: Diagnosis not present

## 2022-01-24 DIAGNOSIS — H6241 Otitis externa in other diseases classified elsewhere, right ear: Secondary | ICD-10-CM | POA: Diagnosis not present

## 2022-01-24 DIAGNOSIS — Z87891 Personal history of nicotine dependence: Secondary | ICD-10-CM | POA: Diagnosis not present

## 2022-01-24 DIAGNOSIS — Z974 Presence of external hearing-aid: Secondary | ICD-10-CM | POA: Diagnosis not present

## 2022-01-24 DIAGNOSIS — Z9089 Acquired absence of other organs: Secondary | ICD-10-CM | POA: Diagnosis not present

## 2022-01-31 DIAGNOSIS — E1121 Type 2 diabetes mellitus with diabetic nephropathy: Secondary | ICD-10-CM | POA: Diagnosis not present

## 2022-02-03 DIAGNOSIS — E1121 Type 2 diabetes mellitus with diabetic nephropathy: Secondary | ICD-10-CM | POA: Diagnosis not present

## 2022-02-06 ENCOUNTER — Ambulatory Visit: Payer: Medicare PPO | Admitting: Podiatry

## 2022-02-11 NOTE — Progress Notes (Addendum)
?Cardiology Office Note:   ? ?Date:  02/12/2022  ? ?ID:  Joshua [name], DOB 1951-04-26, MRN [id number] ? ?PCP:  Joshua Baton, MD  ?Cardiologist:  Joshua Heinz, MD  ?Electrophysiologist:  None  ? ?Referring MD: Joshua Baton, MD  ? ?Chief Complaint  ?Patient presents with  ? Cardiomyopathy  ? ? ?History of Present Illness:   ?  ?Joshua [name] is a 71 y.o. male with a hx of HCM, hypertension, diabetes, OSA, PE after knee surgery, essential tremor status post DBS June 2020, CVA who presents for follow-up.  Admitted to [facility] from 08/28/19 through 08/31/19 with an acute CVA.  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  MRI brain showed right internal capsule infarct.  Echocardiogram showed no cardiogenic source of embolism.  He was started on heparin given subclavian artery thrombosis, and this was transitioned to Eliquis.  It was unclear if the thrombosis was due to a cardiac source or developed in situ.  Stroke team recommended Eliquis for 2 months and repeating CTA neck; if CTA neck negative and no atrial fibrillation noted on 30-day monitor, stroke team recommended stopping Eliquis and resuming antiplatelet agent.  TTE was notable for an incidental finding of asymmetric basal septal hypertrophy.  He was referred to cardiology and seen on 09/19/19.  Cardiac MRI was ordered, which showed Basal septal hypertrophy measuring up to 31m (lateral wall 150m, consistent with hypertrophic cardiomyopathy.  PYP scan showed no evidence of amyloid.  Cardiac monitor showed no VT, occasional PVCs (1.3% of beats).   Repeat monitor on 08/22/2020 showed 1 4 beat run of NSVT, 6 runs of SVT longest lasting 16 beats, occasional PVCs (4%).  He denies any family history of HCM.  Does have vertigo.  No syncope except with CVA years ago.  [facility] with shortness of breath and elevated effort for cardiac fusion.  Started on colchicine and Eliquis  was held.  No adequate window for pericardiocentesis.  Repeat echo 03/08/2021 showed pericardial effusion had resolved, LVEF 55 to 6084%grade 1 diastolic dysfunction, moderate LVH, strain abnormalities suggestive of cardiac amyloidosis.  Lexiscan Myoview on 02/12/2021 showed no evidence of ischemia, EF 53%. ? ?Since last clinic visit, he reports that he has been doing okay.  Has been having dyspnea with minimal exertion.  Denies any chest pain.  Denies any lightheadedness, syncope, lower extremity edema, or palpitations. ? ? ?Wt Readings from Last 3 Encounters:  ?02/12/22 258 lb 12.8 oz (117.4 kg)  ?01/16/22 250 lb (113.4 kg)  ?11/11/21 264 lb 9.6 oz (120 kg)  ? ? ?Past Medical History:  ?Diagnosis Date  ? Benign essential tremor   ? Benign positional vertigo   ? CVA (cerebral vascular accident) ([facility]  ? x2 - L retina, 1 right parietal  ? Degenerative arthritis   ? Depression   ? Diabetes mellitus   ? D[place] thrombosis) [place]  ? Dyslipidemia   ? GERD (gastroesophageal reflux disease)   ? hiatal hernia  ? Gout   ? H/O: vasectomy   ? Hearing aid worn   ? b/l  ? Hx of appendectomy   ? Hx of tonsillectomy   ? Hypertension   ? Hypertrophic cardiomyopathy ([facility] [place]  ? Ischemic optic neuropathy   ? on the left  ? Melanoma ([facility]  ? NSVT (nonsustained ventricular tachycardia)   ? 1 4 beat run on event monitor  in 07/2020  ? Obesity   ? OSA on CPAP   ? setting = 5  ? Pulmonary emboli ([place]) 2018  ? PVC's (premature ventricular contractions)   ? SVT (supraventricular tachycardia) ([place])   ? by event monitor  ? Tremor, essential 06/22/2017  ? Wears glasses   ? ? ?Past Surgical History:  ?Procedure Laterality Date  ? APPENDECTOMY    ? arthroscopic knee surgery Bilateral   ? CATARACT EXTRACTION Bilateral   ? COLONOSCOPY    ? MINOR PLACEMENT OF FIDUCIAL N/A 06/30/2019  ? Procedure: Fiducial placement;  Surgeon: Joshua Levine, MD;  Location: [place];  Service: Neurosurgery;  Laterality: N/A;  Fiducial placement  ? NASAL SEPTUM  SURGERY    ? PULSE GENERATOR IMPLANT N/A 07/14/2019  ? Procedure: Left cranial Implanted Pulse Generator and lead extension placement to right chest ;  Surgeon: Joshua Levine, MD;  Location: [place];  Service: Neurosurgery;  Laterality: N/A;  ? SUBTHALAMIC STIMULATOR INSERTION Left 07/07/2019  ? Procedure: LEFT DEEP BRAIN STIMULATOR PLACEMENT;  Surgeon: Joshua Levine, MD;  Location: [place];  Service: Neurosurgery;  Laterality: Left;  ? TEE WITHOUT CARDIOVERSION N/A 04/23/2021  ? Procedure: TRANSESOPHAGEAL ECHOCARDIOGRAM (TEE);  Surgeon: Joshua Fredrickson Wonda Cheng, MD;  Location: [place];  Service: Cardiovascular;  Laterality: N/A;  ? TONSILLECTOMY    ? TOTAL KNEE ARTHROPLASTY Left 03/30/2017  ? Procedure: LEFT TOTAL KNEE ARTHROPLASTY;  Surgeon: Joshua Cancel, MD;  [facility];  Service: Orthopedics;  Laterality: Left;  ? WISDOM TOOTH EXTRACTION    ? ? ?Current Medications: ?Current Meds  ?Medication Sig  ? amLODipine (NORVASC) 10 MG tablet Take 1 tablet (10 mg total) by mouth at bedtime.  ? apixaban (ELIQUIS) 5 MG TABS tablet Take 1 tablet (5 mg total) by mouth 2 (two) times daily.  ? aspirin 81 MG EC tablet 1 tablet  ? atorvastatin (LIPITOR) 80 MG tablet Take 1 tablet (80 mg total) by mouth daily at 6 PM. (Patient taking differently: Take 40 mg by mouth daily at 6 PM.)  ? clindamycin (CLEOCIN) 150 MG capsule Take 1 capsule (150 mg total) by mouth every 6 (six) hours.  ? colchicine 0.6 MG tablet colchicine 0.6 mg tablet ? TAKE ONE TABLET BY MOUTH TWICE DAILY.  ? Continuous Blood Gluc Sensor (FREESTYLE LIBRE 2 SENSOR) MISC See admin instructions.  ? cycloSPORINE (RESTASIS) 0.05 % ophthalmic emulsion Apply to eye.  ? DULoxetine (CYMBALTA) 60 MG capsule Take 60 mg by mouth daily.  ? escitalopram (LEXAPRO) 20 MG tablet Take 20 mg by mouth daily.  ? ezetimibe (ZETIA) 10 MG tablet Take 1 tablet (10 mg total) by mouth daily.  ? fenofibrate 54 MG tablet Take 54 mg by mouth daily.  ? furosemide (LASIX) 40 MG tablet Take 1 tablet (40  mg total) by mouth as needed (Weight increase of 3 pounds in 1 day or 5 pounds in 1 week.).  ? Insulin Glargine, 1 Unit Dial, (TOUJEO SOLOSTAR) 300 UNIT/ML SOPN Inject 80 Units into the skin 2 (two) times daily.  ? insulin lispro (HUMALOG) 100 UNIT/ML KwikPen Inject 15-25 Units into the skin See admin instructions. Inject 15 units subcutaneously after breakfast and 25 units after lunch and supper  ? losartan (COZAAR) 100 MG tablet Take 1 tablet by mouth daily.  ? Lutein-Zeaxanthin 25-5 MG CAPS Take 1 tablet by mouth daily.  ? metFORMIN (GLUCOPHAGE) 500 MG tablet Take 500 mg by mouth daily with breakfast.  ? metFORMIN (GLUCOPHAGE) 500 MG tablet 1 tablet with a  meal  ? metoprolol succinate (TOPROL-XL) 50 MG 24 hr tablet Take 1 tablet (50 mg total) by mouth daily. Take with or immediately following a meal.  ? mirtazapine (REMERON) 7.5 MG tablet Take 7.5 mg by mouth at bedtime.  ? Multiple Vitamin (MULTIVITAMIN WITH MINERALS) TABS tablet Take 1 tablet by mouth daily.  ? Omega-3 Fatty Acids (FISH OIL) 1000 MG CAPS Take 2,000 mg by mouth daily. Reported on 02/13/2016  ? ONETOUCH VERIO test strip   ? Polyethyl Glycol-Propyl Glycol (SYSTANE OP) Place 1-2 drops into both eyes 4 (four) times daily as needed (dry eyes).   ? PRESCRIPTION MEDICATION Inhale into the lungs at bedtime. CPAP  ? RESTASIS 0.05 % ophthalmic emulsion Place 1 drop into both eyes 2 (two) times daily as needed (dry eyes).  ? SURE COMFORT PEN NEEDLES 31G X 5 MM MISC   ? [DISCONTINUED] amLODipine (NORVASC) 10 MG tablet 1 tablet  ? [DISCONTINUED] metoprolol tartrate (LOPRESSOR) 25 MG tablet Take 1 tablet (25 mg total) by mouth 2 (two) times daily.  ?  ? ?Allergies:   Melatonin  ? ?Social History  ? ?Socioeconomic History  ? Marital status: Married  ?  Spouse name: [name]  ? Number of children: 2  ? Years of education: Bachelors   ? Highest education level: Bachelor's degree (e.g., BA, AB, BS)  ?Occupational History  ? Occupation: retired  ?  Employer: [employer]  ?  Comment: teaching/coaching  ?Tobacco Use  ? Smoking status: Former  ?  Packs/day: 1.00  ?  Years: 10.00  ?  Pack years: 10.00  ?  Types: Cigarettes  ?  Quit date: 11/25/1983  ?  Years s

## 2022-02-12 ENCOUNTER — Other Ambulatory Visit: Payer: Self-pay

## 2022-02-12 ENCOUNTER — Encounter: Payer: Self-pay | Admitting: Cardiology

## 2022-02-12 ENCOUNTER — Ambulatory Visit: Payer: Medicare PPO | Admitting: Cardiology

## 2022-02-12 VITALS — BP 141/84 | HR 72 | Ht 76.0 in | Wt 258.8 lb

## 2022-02-12 DIAGNOSIS — E785 Hyperlipidemia, unspecified: Secondary | ICD-10-CM | POA: Diagnosis not present

## 2022-02-12 DIAGNOSIS — M79605 Pain in left leg: Secondary | ICD-10-CM | POA: Diagnosis not present

## 2022-02-12 DIAGNOSIS — M79604 Pain in right leg: Secondary | ICD-10-CM

## 2022-02-12 DIAGNOSIS — I10 Essential (primary) hypertension: Secondary | ICD-10-CM | POA: Diagnosis not present

## 2022-02-12 DIAGNOSIS — I639 Cerebral infarction, unspecified: Secondary | ICD-10-CM | POA: Diagnosis not present

## 2022-02-12 DIAGNOSIS — I422 Other hypertrophic cardiomyopathy: Secondary | ICD-10-CM | POA: Diagnosis not present

## 2022-02-12 MED ORDER — METOPROLOL SUCCINATE ER 50 MG PO TB24: 50.0000 mg | ORAL_TABLET | Freq: Every day | ORAL | 3 refills | Status: DC

## 2022-02-12 NOTE — Patient Instructions (Signed)
Medication Instructions:  ?STOP metoprolol tartrate (Lopressor) ?START metoprolol succinate (Toprol XL) 50 mg daily ? ?*If you need a refill on your cardiac medications before your next appointment, please call your pharmacy* ? ?Testing/Procedures: ?Your physician has requested that you have an echocardiogram. Echocardiography is a painless test that uses sound waves to create images of your heart. It provides your doctor with information about the size and shape of your heart and how well your heart?s chambers and valves are working. This procedure takes approximately one hour. There are no restrictions for this procedure. ? ?Your physician has requested that you have an ankle brachial index (ABI). During this test an ultrasound and blood pressure cuff are used to evaluate the arteries that supply the arms and legs with blood. Allow thirty minutes for this exam. There are no restrictions or special instructions. ? ?Follow-Up: ?At [facility], you and your health needs are our priority.  As part of our continuing mission to provide you with exceptional heart care, we have created designated Provider Care Teams.  These Care Teams include your primary Cardiologist (physician) and Advanced Practice Providers (APPs -  Physician Assistants and Nurse Practitioners) who all work together to provide you with the care you need, when you need it. ? ?We recommend signing up for the patient portal called "MyChart".  Sign up information is provided on this After Visit Summary.  MyChart is used to connect with patients for Virtual Visits (Telemedicine).  Patients are able to view lab/test results, encounter notes, upcoming appointments, etc.  Non-urgent messages can be sent to your provider as well.   ?To learn more about what you can do with MyChart, go to [email or web address].   ? ?Your next appointment:   ?6 month(s) ? ?The format for your next appointment:   ?In Person ? ?Provider:   ?Donato Heinz, MD  { ? ? ? ?

## 2022-02-14 NOTE — Progress Notes (Signed)
? ? ?Assessment/Plan:  ? ?1.  Essential Tremor. ? -The patient is status post left VIM DBS on July 07, 2019 with [facility] device.  Intention was originally to do a bilateral VIM DBS, but the patient had a vagal episode in the operating room with associated nausea and vomiting with positive CSF, and it was decided not to attempt the other side.   ? -reprogrammed dbs today. ? -Unfortunately, I think that the patient has developed ataxic tremor.  This is a very different tremor type and virtually impossible to control with DBS.  His tremor got worse after his May, 2022 infarct, which was positioned very close to the DBS lead in the thalamus.  I think that this likely affected the final cortical projection pathways and cerebellar outflow tracts.  That being said, he was much better after reprogramming today, but still had proximal right arm tremor. ? ? ?2.  History of cerebral infarct, October, 2020 and May, 2022 ? -MRI in October, 2020 demonstrated acute right posterior limb of the internal capsule infarct.  However, stroke neurology felt that symptoms were more consistent with a posterior circulation stroke, related to thrombus in the left subclavian (although patient certainly does have mild left facial droop today).   ? -MRI in 2022 with small infarct in the thalamus on the left.  TEE was negative.  Patient was already on Eliquis (had just been restarted a few weeks before, as had been stopped for pericarditis). ? -On atorvastatin, 80 mg, Zetia 10 mg.  LDL slightly above goal at 99 (goal less than 70) ? -Patient's current hemoglobin A1c of 7.9, goal less than 7.0 ? -Patient currently on amlodipine 10, losartan 100 mg, Lopressor 25 mg twice per day -long-term blood pressure goal normotensive ? -Patient on baby aspirin and Eliquis.  Understands risks of both of these together. ? ? ?3.  Dysphagia ? -Modified barium swallow in June, 2022 was worse compared to October, 2021 with evidence of pharyngeal phase  dysphagia. ? ?4.  Hypertrophic cardiomyopathy ? -Following with cardiology.   ? ? ?Subjective:  ? ?Joshua [name] was seen in follow-up in the movement disorder clinic for essential tremor.  He was seen by the [employer] rep in our office since our last visit due to complaints of tremor.  She did try to modify his device, but unfortunately the patient continues to complain of excessive tremor.  He did have a fall in late January and did hit his head.  No loss of consciousness. ? ? ?Allergies  ?Allergen Reactions  ? Melatonin Other (See Comments)  ?  dizzy  ? ? ?Current Outpatient Medications  ?Medication Instructions  ? amLODipine (NORVASC) 10 mg, Oral, Daily at bedtime  ? aspirin 81 MG EC tablet 1 tablet  ? atorvastatin (LIPITOR) 80 mg, Oral, Daily-1800  ? clindamycin (CLEOCIN) 150 mg, Oral, Every 6 hours  ? colchicine 0.6 MG tablet colchicine 0.6 mg tablet ? TAKE ONE TABLET BY MOUTH TWICE DAILY.  ? Continuous Blood Gluc Sensor (FREESTYLE LIBRE 2 SENSOR) MISC See admin instructions  ? cycloSPORINE (RESTASIS) 0.05 % ophthalmic emulsion Ophthalmic  ? DULoxetine (CYMBALTA) 60 mg, Oral, Daily  ? Eliquis 5 mg, Oral, 2 times daily  ? escitalopram (LEXAPRO) 20 mg, Oral, Daily  ? ezetimibe (ZETIA) 10 mg, Oral, Daily  ? fenofibrate 54 mg, Oral, Daily  ? Fish Oil 2,000 mg, Oral, Daily, Reported on 02/13/2016  ? furosemide (LASIX) 40 mg, Oral, As needed  ? insulin lispro (HUMALOG) 15-25  Units, Subcutaneous, See admin instructions, Inject 15 units subcutaneously after breakfast and 25 units after lunch and supper   ? losartan (COZAAR) 100 MG tablet 1 tablet, Oral, Daily  ? Lutein-Zeaxanthin 25-5 MG CAPS 1 tablet, Oral, Daily  ? metFORMIN (GLUCOPHAGE) 500 MG tablet 1 tablet with a meal  ? metFORMIN (GLUCOPHAGE) 500 mg, Oral, Daily with breakfast  ? metoprolol succinate (TOPROL-XL) 50 mg, Oral, Daily, Take with or immediately following a meal.  ? mirtazapine (REMERON) 7.5 mg, Oral, Daily at bedtime  ? Multiple Vitamin  (MULTIVITAMIN WITH MINERALS) TABS tablet 1 tablet, Oral, Daily  ? ONETOUCH VERIO test strip No dose, route, or frequency recorded.  ? Polyethyl Glycol-Propyl Glycol (SYSTANE OP) 1-2 drops, Both Eyes, 4 times daily PRN  ? PRESCRIPTION MEDICATION Inhalation, Daily at bedtime, CPAP   ? RESTASIS 0.05 % ophthalmic emulsion 1 drop, Both Eyes, 2 times daily PRN  ? SURE COMFORT PEN NEEDLES 31G X 5 MM MISC No dose, route, or frequency recorded.  ? Toujeo SoloStar 80 Units, Subcutaneous, 2 times daily  ? ? ? ?Objective:  ? ?VITALS:   ?Vitals:  ? 02/17/22 1435  ?BP: 126/74  ?Pulse: 70  ?Resp: 18  ?Weight: 256 lb (116.1 kg)  ?Height: '6\' 4"'$  (1.93 m)  ? ? ? ? ?Gen:  Appears stated age and in NAD. ?HEENT:  Normocephalic, atraumatic. The mucous membranes are moist.  ? ? ? ?NEUROLOGICAL: ? ?Orientation:  The patient is alert and oriented x 3.   ?Cranial nerves: There is good facial symmetry. Extraocular muscles are intact and visual fields are full to confrontational testing. Speech is mildly dysphasic but no dysarthria.  It is better the longer he talks (same as previous visit).  Soft palate rises symmetrically and there is no tongue deviation. Hearing is intact to conversational tone. ?Tone: Tone is good throughout. ?Motor:  5/5 in the UE/LE ? ? ?MOVEMENT EXAM: ?Movements: With the device off, there is very severe tremor on the right.  Before programming, with the device on, the patient had moderate tremor on the right, with an ataxic component.  Post programming, patient had no tremor of the outstretched hands.  He did have very mild tremor when he approached his mouth.  He does have ataxic tremor when he goes to write.  This is mostly a proximal tremor.  It is markedly better than preprogramming. ? I have reviewed and interpreted the following labs independently ?  Chemistry   ?   ?Component Value Date/Time  ? NA 142 01/16/2022 1153  ? NA 141 03/26/2021 1632  ? K 4.0 01/16/2022 1153  ? CL 106 01/16/2022 1153  ? CO2 27 01/16/2022  1153  ? BUN 23 01/16/2022 1153  ? BUN 32 (H) 03/26/2021 1632  ? CREATININE 1.59 (H) 01/16/2022 1153  ?    ?Component Value Date/Time  ? CALCIUM 9.2 01/16/2022 1153  ? ALKPHOS 77 04/22/2021 0054  ? AST 21 04/22/2021 0054  ? ALT 29 04/22/2021 0054  ? BILITOT 0.6 04/22/2021 0054  ? BILITOT 0.4 10/24/2020 0943  ?  ? ? ?Lab Results  ?Component Value Date  ? WBC 9.9 01/16/2022  ? HGB 13.2 01/16/2022  ? HCT 40.0 01/16/2022  ? MCV 88.5 01/16/2022  ? PLT 229 01/16/2022  ? ?Lab Results  ?Component Value Date  ? TSH 1.421 01/30/2021  ? ? ? ?CC:  [name], MD ? ? ?

## 2022-02-17 ENCOUNTER — Other Ambulatory Visit: Payer: Self-pay

## 2022-02-17 ENCOUNTER — Ambulatory Visit (INDEPENDENT_AMBULATORY_CARE_PROVIDER_SITE_OTHER): Payer: Medicare PPO | Admitting: Neurology

## 2022-02-17 ENCOUNTER — Encounter: Payer: Self-pay | Admitting: Neurology

## 2022-02-17 VITALS — BP 126/74 | HR 70 | Resp 18 | Ht 76.0 in | Wt 256.0 lb

## 2022-02-17 DIAGNOSIS — I422 Other hypertrophic cardiomyopathy: Secondary | ICD-10-CM | POA: Diagnosis not present

## 2022-02-17 DIAGNOSIS — R270 Ataxia, unspecified: Secondary | ICD-10-CM

## 2022-02-17 DIAGNOSIS — G250 Essential tremor: Secondary | ICD-10-CM | POA: Diagnosis not present

## 2022-02-17 NOTE — Procedures (Signed)
DBS Programming was performed.   ? ?Manufacturer of DBS device: [place] ? ?DBS Programming was performed.   ? ?Manufacturer of DBS device: [employer] ? ?Total time spent programming was 42 minutes (independent of other visit time).  Device was confirmed to be on.  Soft start was confirmed to be on.  Impedences were checked and were within normal limits.  Battery was checked and was determined to be functioning normally and not near the end of life.  Final settings were as follows, with program 2 being active: ? ? Active Contacts Amplitude (mA) PW (ms) Frequency (hz)   ?Left Brain       ?Program 1       ?11/28/2019 5-C+ 2.5 60 130   ?04/25/20 5-C+ 2.7 60 130   ?10/25/20 5-C+ 3.5(2.1-3.8) 60 130   ?       ?       ?       ?       ?Program 2       ?11/28/19 5-8+ 2.2 60 130   ?04/25/20 5-8+ 3.2 60 130   ?                      10/25/20 5-8+ 3.6 (3.0-4.0) 70 130   ?03/26/21 5-8+ 4.1 (3.0-4.4) 90 130   ?09/26/21 2-(40%)6-(60%)8+ 3.6 90 154 Higher frequency with tongue paresthesia  ?11/11/21 6-(50%)7-(50%)8+ 3.8 100 170   ?01/28/22 3-(50%)6-(50%)8+ 4.0 80 170 Mild lip paresthesia  ?       ?       ?       ?       ?       ?Right Brain       ?Not active       ?       ?       ? ? ? ?Prior visits: ? ?Monopolar review: ?Left brain electrode:     1-C+           ; Amplitude  1.0   ma   ; Pulse width 60 microseconds;   Frequency   130   Hz.  (Tongue paresthesias) ?Left brain electrode:     (2-3-4-) 33% each C+           ; Amplitude  2.0   ma   ; Pulse width 60 microseconds;   Frequency   130   Hz.  (Tongue and lip paresthesias, fairly good tremor control) ?Left brain electrode:     (5-6-7-) 33% C+           ; Amplitude  2.0   ma   ; Pulse width 60 microseconds;   Frequency   130   Hz.  (Tongue paresthesias but no lip paresthesia and good tremor control) ?Left brain electrode:     8-C+           ; Amplitude  1.0   ma   ; Pulse width 60 microseconds;   Frequency   130   Hz.  (Paresthesias resolved) ?

## 2022-02-19 DIAGNOSIS — J029 Acute pharyngitis, unspecified: Secondary | ICD-10-CM | POA: Diagnosis not present

## 2022-02-20 ENCOUNTER — Other Ambulatory Visit: Payer: Self-pay | Admitting: Otolaryngology

## 2022-02-20 DIAGNOSIS — J029 Acute pharyngitis, unspecified: Secondary | ICD-10-CM

## 2022-02-20 IMAGING — CT CT HEAD W/O CM
4 series · 16 of 47 positions shown, 18 images · non-contrast
Comparison: Head CT 2 days ago 01/27/2021

CLINICAL DATA: Head trauma, minor (Age >= 65y) multiple falls on
eliquis, progressive generalized weakness with right sided headache
since [REDACTED]

EXAM:
CT HEAD WITHOUT CONTRAST
TECHNIQUE: Contiguous axial images were obtained from the base of the skull
through the vertex without intravenous contrast.

[Series 3: head wo · axial · 0.55mm/px · z∈[-190,-54]mm · 7 of 37 slices shown, 9 images]
[im 5/37  brain]
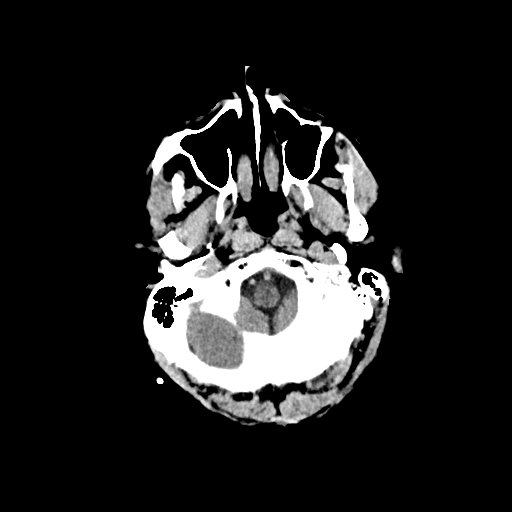
[im 5/37  bone]
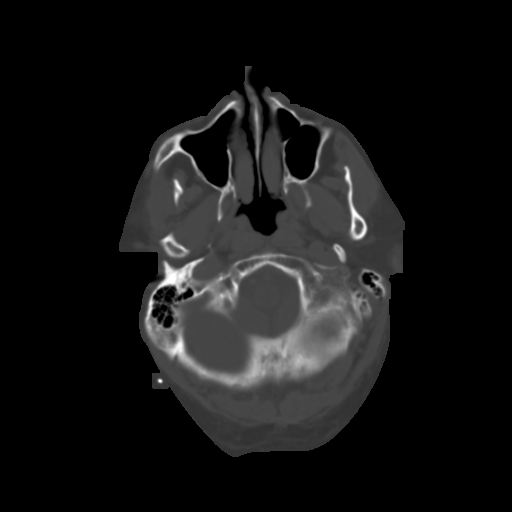
[im 10/37  brain]
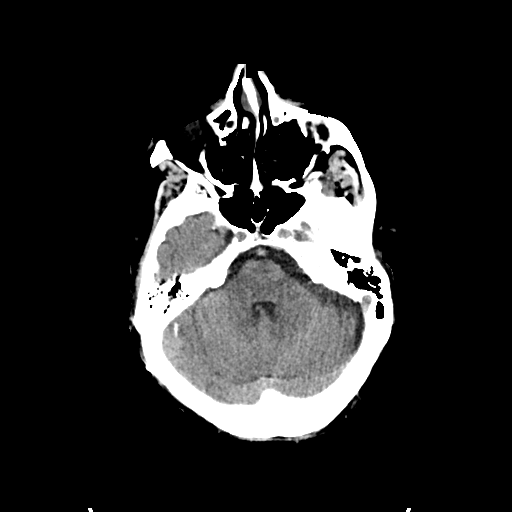
[im 14/37  brain]
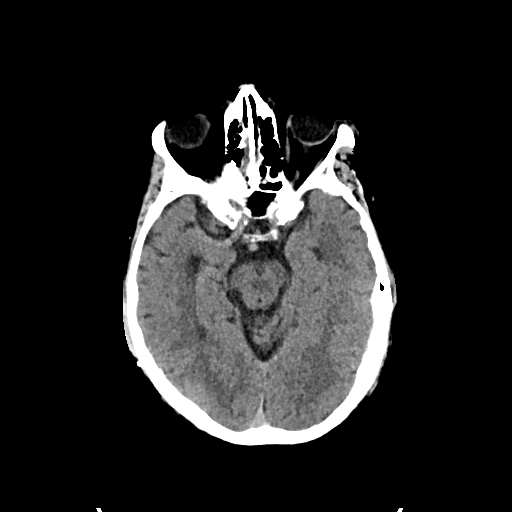
[im 19/37  brain]
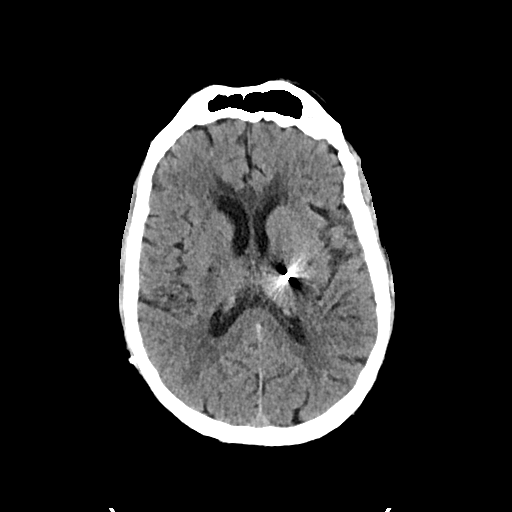
[im 23/37  brain]
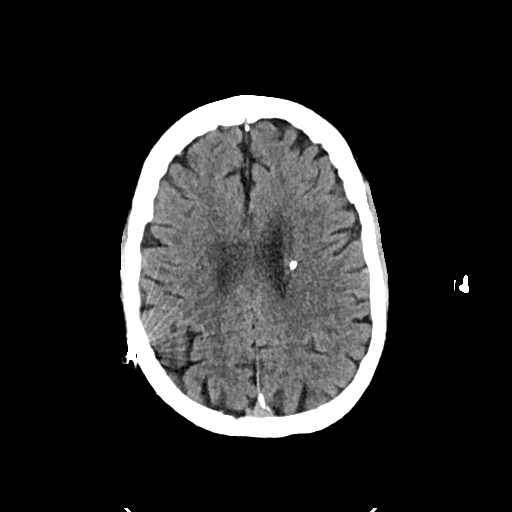
[im 23/37  bone]
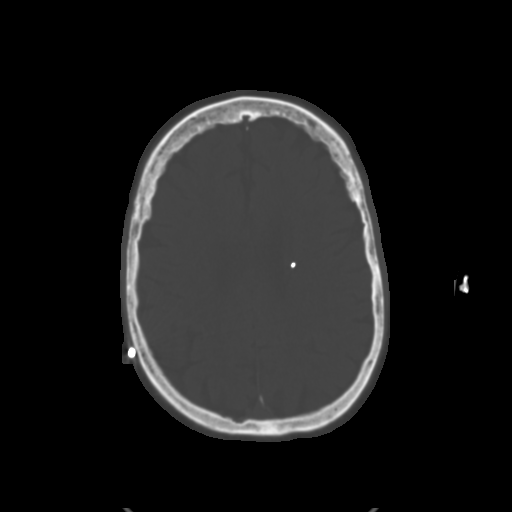
[im 28/37  brain]
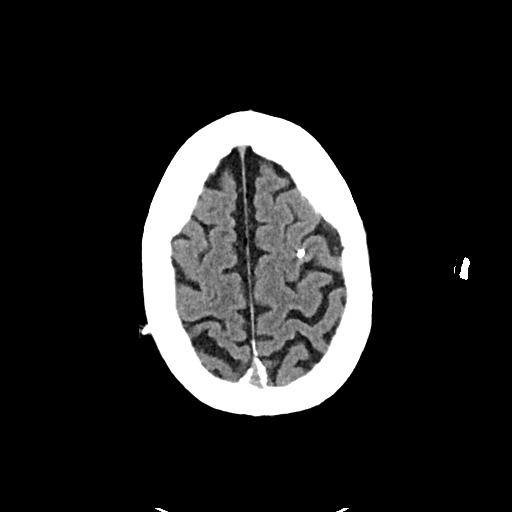
[im 32/37  brain]
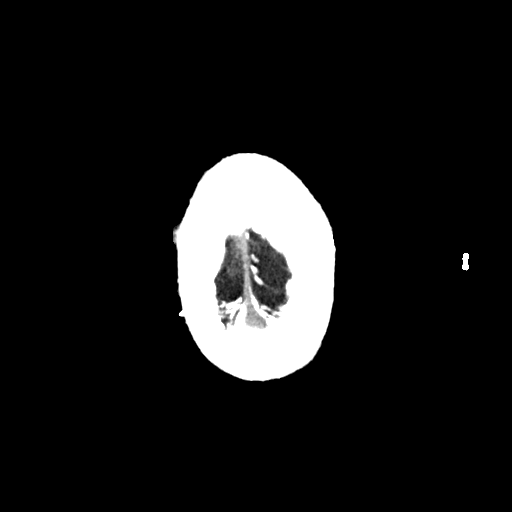

[Series 4: head bone · axial · 0.55mm/px · z∈[-192,-156]mm · 3 of 92 slices shown]
[im 10/92  bone]
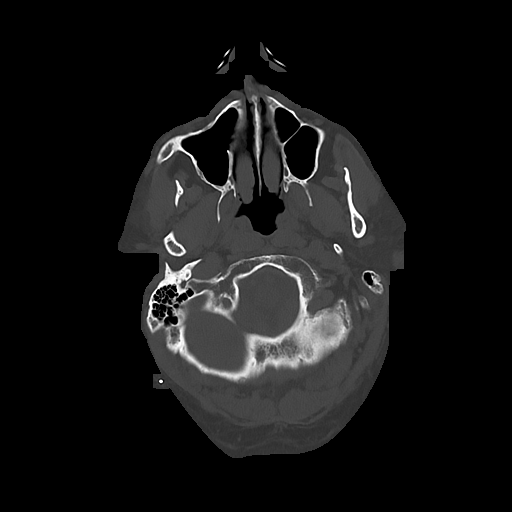
[im 19/92  bone]
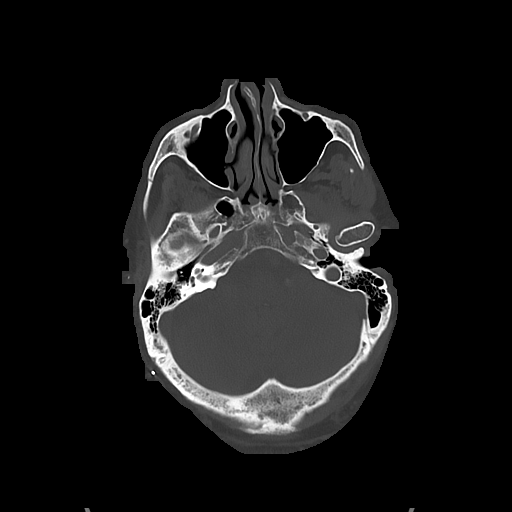
[im 28/92  bone]
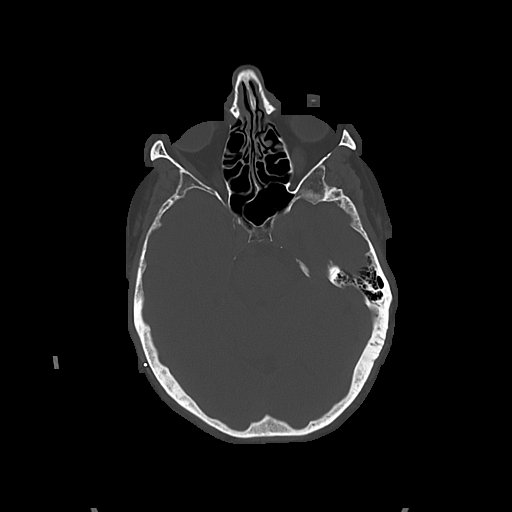

[Series 5: cor soft · coronal · 0.37mm/px · 3 of 81 slices shown]
[im 27/81  brain]
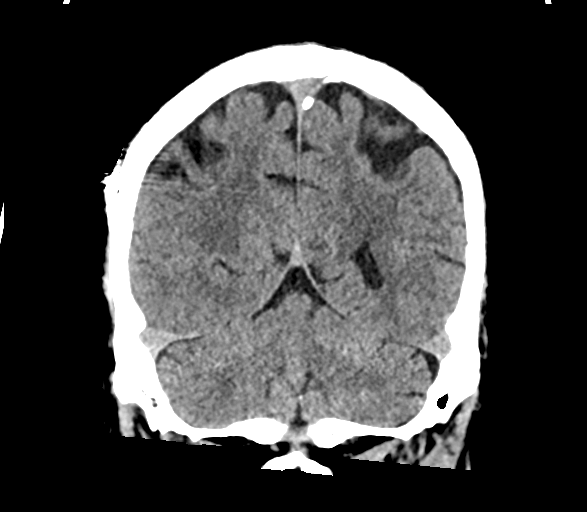
[im 36/81  brain]
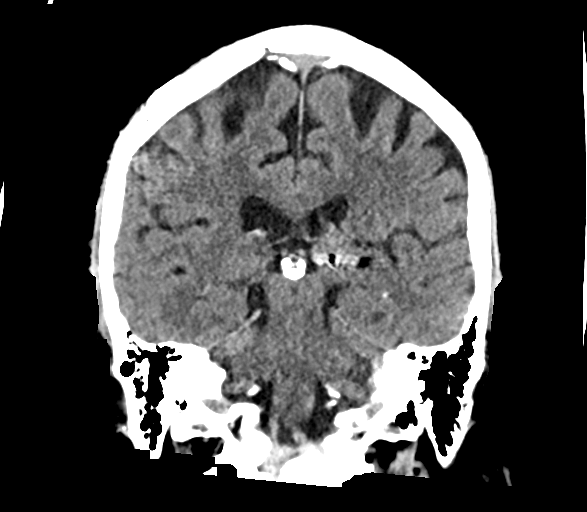
[im 45/81  brain]
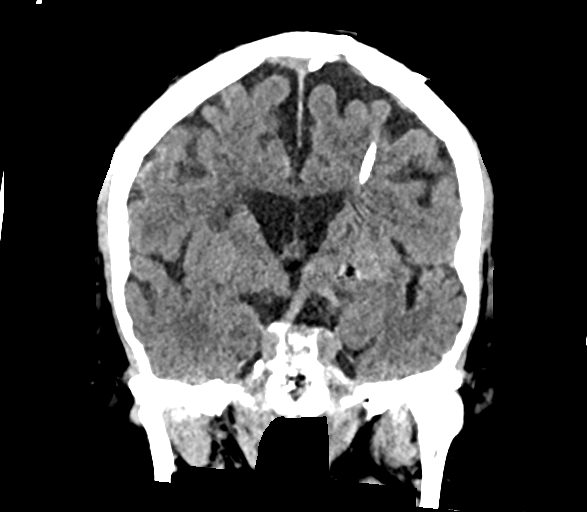

[Series 6: sag soft · sagittal · 0.37mm/px · 3 of 73 slices shown]
[im 25/73  brain]
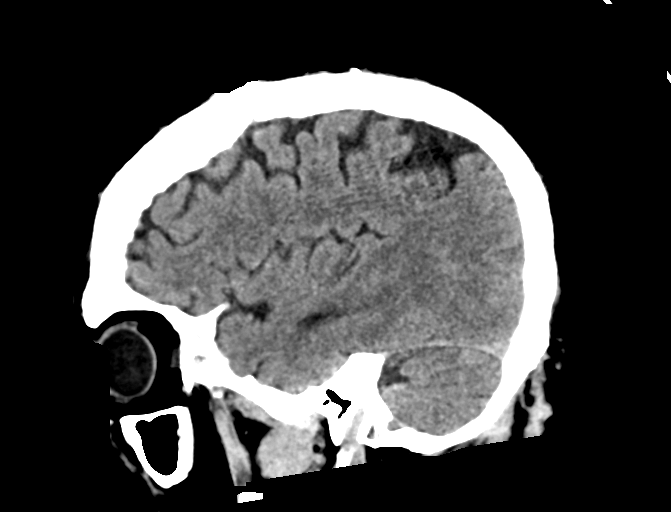
[im 37/73  brain]
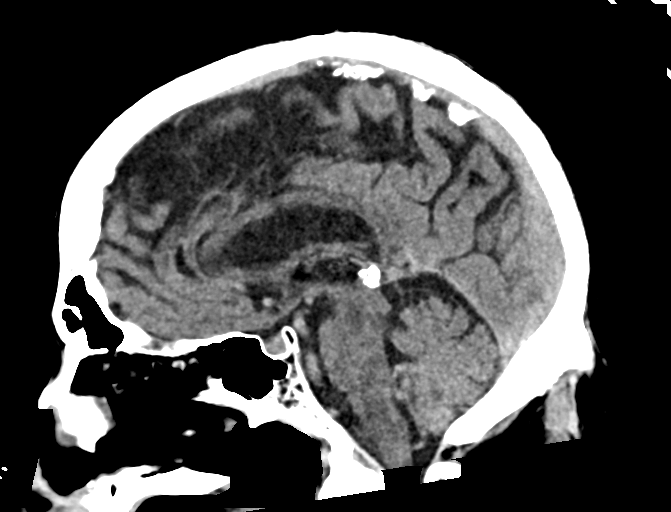
[im 48/73  brain]
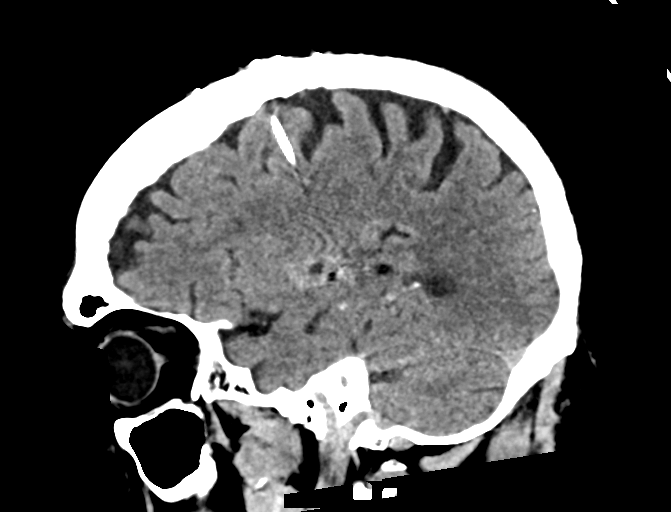

[16 of 47 positions shown; findings below may reference images not displayed]

FINDINGS: Brain: Deep brain stimulator from a left frontal approach unchanged
with tip in the deep basal ganglia. No intracranial hemorrhage, mass
effect, or midline shift. No hydrocephalus. The basilar cisterns are
patent. Stable degree of periventricular white matter hypodensity
typical of chronic small vessel ischemia. Punctate right basal
ganglia lacunar infarct. No evidence of territorial infarct or acute
ischemia. No extra-axial or intracranial fluid collection.

Vascular: Atherosclerosis of skullbase vasculature without
hyperdense vessel or abnormal calcification.

Skull: No fracture or focal lesion.

Sinuses/Orbits: Bilateral cataract resection. Metallic density
within the inferior left orbit, unchanged. Punctate metallic density
just lateral to the right orbit. Paranasal sinuses are clear.
Mastoid air cells are well aerated.

Other: None.
IMPRESSION: 1. No acute intracranial abnormality. No skull fracture.
2. Stable chronic small vessel ischemia. Deep brain stimulator in
place.

## 2022-02-20 IMAGING — CR DG CHEST 2V
2 series · 2 of 2 positions shown · non-contrast
Comparison: Chest x-ray dated January 27, 2021.

CLINICAL DATA: Hypoxia.

EXAM:
CHEST - 2 VIEW

[chest lat]
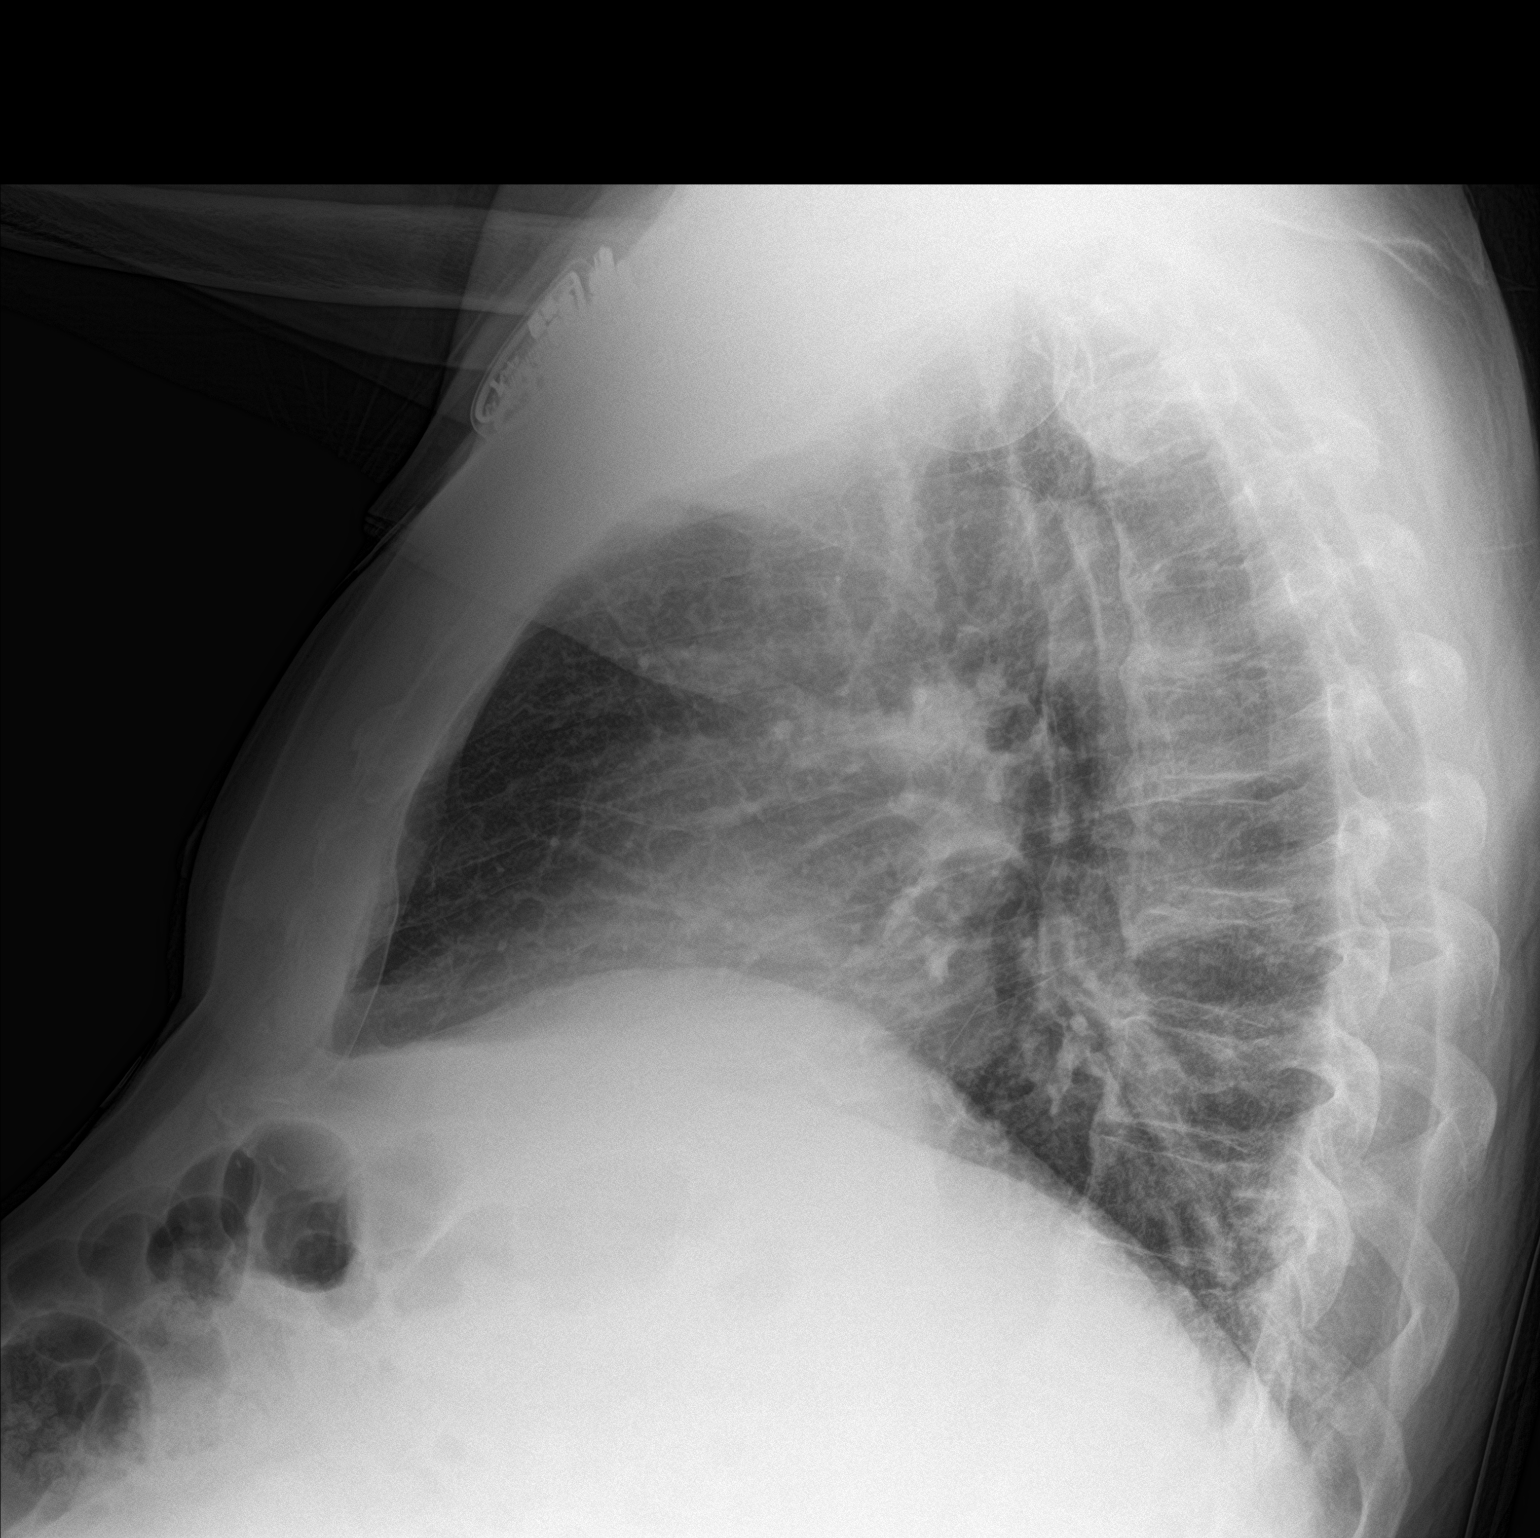

[chest ap]
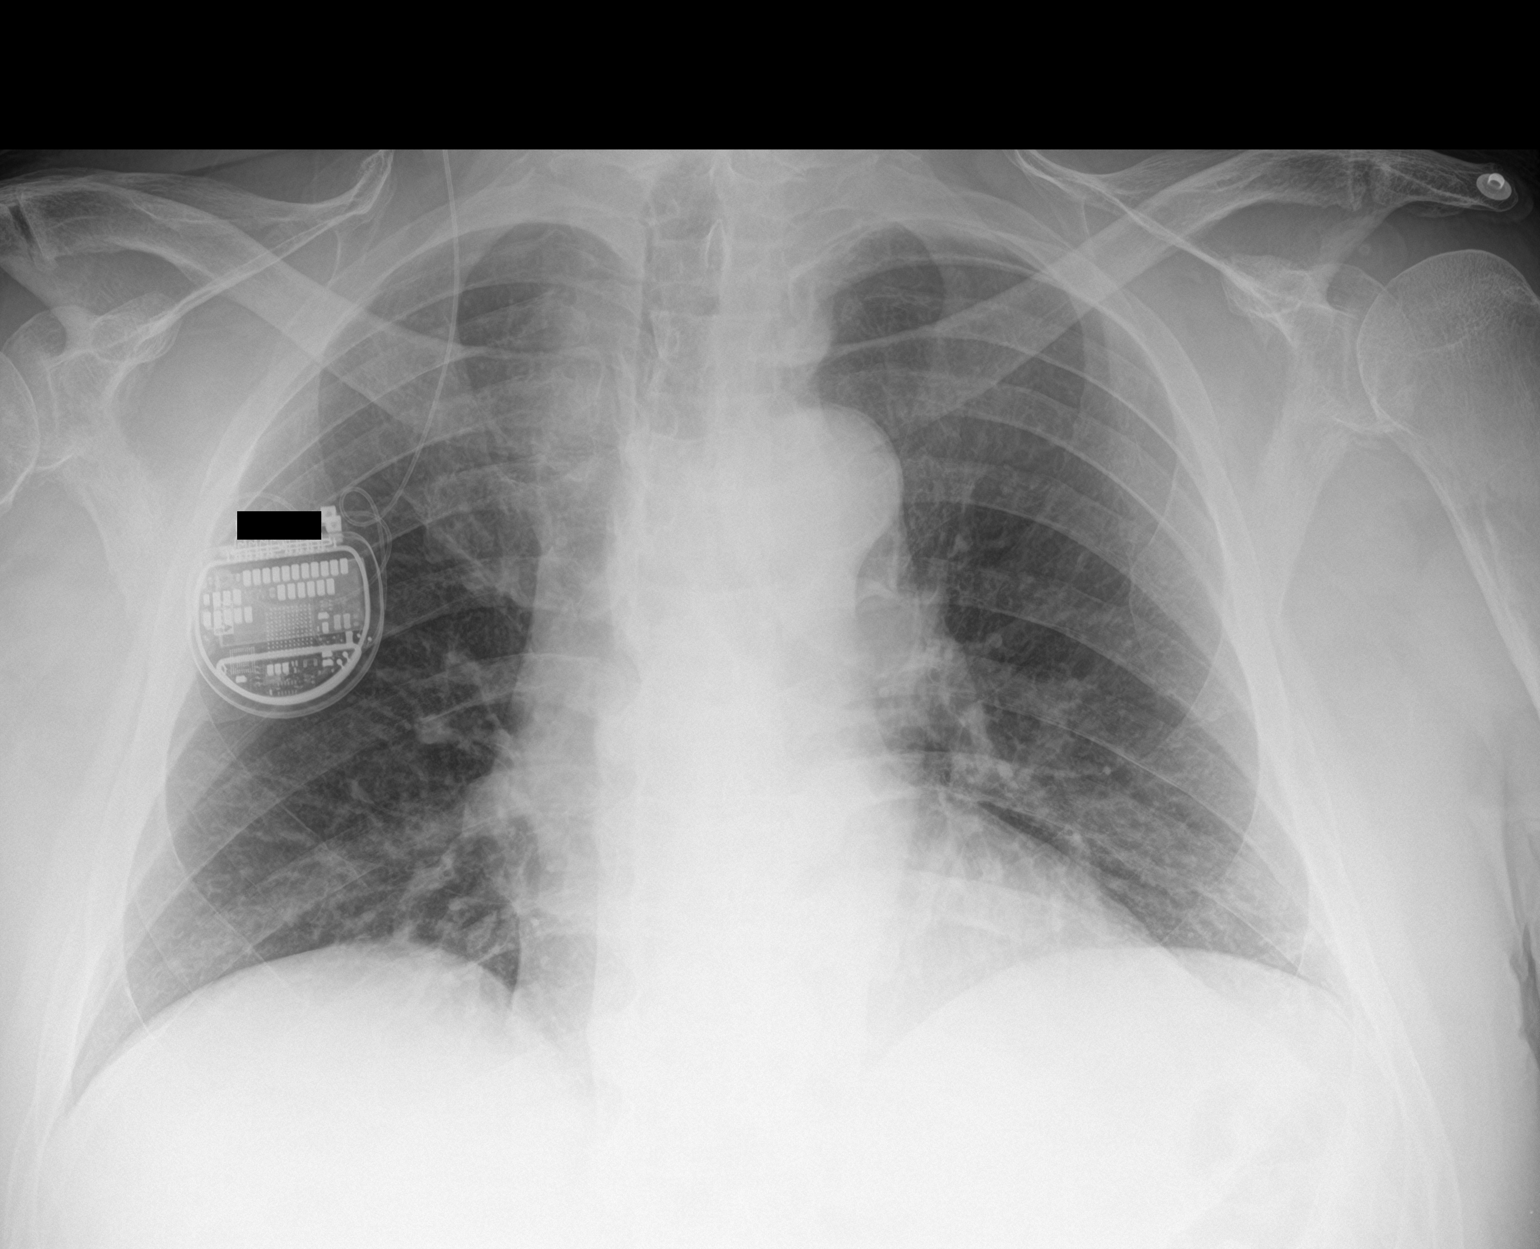

[2 of 2 positions shown; findings below may reference images not displayed]

FINDINGS: The heart size and mediastinal contours are within normal limits.
Normal pulmonary vascularity. Low lung volumes with mild bibasilar
atelectasis. No focal consolidation, pleural effusion, or
pneumothorax. No acute osseous abnormality. Unchanged stimulator
pack overlying the right chest.
IMPRESSION: 1. Low lung volumes with mild bibasilar atelectasis.

## 2022-02-21 ENCOUNTER — Ambulatory Visit (HOSPITAL_COMMUNITY): Payer: Medicare PPO | Attending: Cardiology

## 2022-02-21 DIAGNOSIS — I422 Other hypertrophic cardiomyopathy: Secondary | ICD-10-CM | POA: Diagnosis not present

## 2022-02-21 LAB — ECHOCARDIOGRAM COMPLETE
Area-P 1/2: 3.12 cm2
S' Lateral: 2.6 cm

## 2022-02-25 ENCOUNTER — Ambulatory Visit: Payer: Medicare PPO | Admitting: Podiatry

## 2022-03-03 DIAGNOSIS — E1121 Type 2 diabetes mellitus with diabetic nephropathy: Secondary | ICD-10-CM | POA: Diagnosis not present

## 2022-03-10 ENCOUNTER — Encounter (HOSPITAL_COMMUNITY): Payer: Medicare PPO

## 2022-03-10 ENCOUNTER — Ambulatory Visit (HOSPITAL_COMMUNITY)
Admission: RE | Admit: 2022-03-10 | Discharge: 2022-03-10 | Disposition: A | Discharge status: Home / Self Care | Payer: Medicare PPO | Source: Ambulatory Visit | Attending: Cardiology | Admitting: Cardiology

## 2022-03-10 ENCOUNTER — Other Ambulatory Visit: Payer: Self-pay | Admitting: Cardiology

## 2022-03-10 DIAGNOSIS — M79604 Pain in right leg: Secondary | ICD-10-CM | POA: Insufficient documentation

## 2022-03-10 DIAGNOSIS — M79605 Pain in left leg: Secondary | ICD-10-CM | POA: Insufficient documentation

## 2022-03-18 ENCOUNTER — Other Ambulatory Visit: Payer: Self-pay | Admitting: Cardiology

## 2022-03-25 ENCOUNTER — Ambulatory Visit: Payer: Medicare PPO | Admitting: Podiatry

## 2022-03-25 DIAGNOSIS — B351 Tinea unguium: Secondary | ICD-10-CM | POA: Diagnosis not present

## 2022-03-25 DIAGNOSIS — Z7901 Long term (current) use of anticoagulants: Secondary | ICD-10-CM

## 2022-03-25 DIAGNOSIS — M79675 Pain in left toe(s): Secondary | ICD-10-CM

## 2022-03-25 DIAGNOSIS — M2042 Other hammer toe(s) (acquired), left foot: Secondary | ICD-10-CM

## 2022-03-25 DIAGNOSIS — M216X9 Other acquired deformities of unspecified foot: Secondary | ICD-10-CM

## 2022-03-25 DIAGNOSIS — L84 Corns and callosities: Secondary | ICD-10-CM

## 2022-03-25 DIAGNOSIS — M79674 Pain in right toe(s): Secondary | ICD-10-CM | POA: Diagnosis not present

## 2022-03-25 DIAGNOSIS — E11621 Type 2 diabetes mellitus with foot ulcer: Secondary | ICD-10-CM | POA: Diagnosis not present

## 2022-03-25 DIAGNOSIS — M2041 Other hammer toe(s) (acquired), right foot: Secondary | ICD-10-CM

## 2022-03-25 DIAGNOSIS — L97529 Non-pressure chronic ulcer of other part of left foot with unspecified severity: Secondary | ICD-10-CM

## 2022-03-26 DIAGNOSIS — J029 Acute pharyngitis, unspecified: Secondary | ICD-10-CM | POA: Diagnosis not present

## 2022-03-27 NOTE — Progress Notes (Signed)
Subjective: ?71 year old male presents the office today for diabetic foot evaluation and for elongated nails that he cannot trim himself as well as calluses.  He is asked about diabetic shoes but has no other lower extremity complaints today.  No ulceration at this time.  The calluses did not cause significant discomfort.  ? ?Last A1c was 7.9 and he reports last glucose of 98 ?[name], MD ? ?Objective: ?AAO x3, NAD ?DP/PT pulses palpable bilaterally, CRT less than 3 seconds ?Sensation decreased with Semmes Weinstein monofilament  ?Nails are hypertrophic, dystrophic, brittle, discolored, elongated ?10. No surrounding redness or drainage. Tenderness nails 1-5 bilaterally.  ?Cavus foot type is present resulting in plantarflexed first ray and prominent fifth metatarsal head.  No significant hyperkeratotic tissue today.  There is no open lesions bilaterally. ?No pain with calf compression, swelling, warmth, erythema ? ?Assessment: ?71 year old male with cavus foot type resulting in preulcerative calluses; symptomatic onychomycosis; on Eliquis ? ?Plan: ?-All treatment options discussed with the patient including all alternatives, risks, complications.  ?-Sharply debrided the toenails x10 without any complications or bleeding ?-Continue moisturizer and offloading for the calluses. ?-We will follow-up with diabetic shoes.  Order placed for this today. ?-Glucose control  ?-Patient encouraged to call the office with any questions, concerns, change in symptoms.  ? ?RTC 3 months ? ?Trula Slade DPM ? ?

## 2022-03-31 ENCOUNTER — Ambulatory Visit: Payer: Medicare PPO

## 2022-03-31 DIAGNOSIS — L84 Corns and callosities: Secondary | ICD-10-CM

## 2022-03-31 DIAGNOSIS — E11621 Type 2 diabetes mellitus with foot ulcer: Secondary | ICD-10-CM

## 2022-03-31 DIAGNOSIS — M2041 Other hammer toe(s) (acquired), right foot: Secondary | ICD-10-CM

## 2022-03-31 DIAGNOSIS — M216X9 Other acquired deformities of unspecified foot: Secondary | ICD-10-CM

## 2022-03-31 NOTE — Progress Notes (Signed)
SITUATION ?Reason for Consult: Evaluation for Prefabricated Diabetic Shoes and Custom Diabetic Inserts. ?Patient / Caregiver Report: Patient would like well fitting shoes ? ?OBJECTIVE DATA: ?Patient History / Diagnosis:  ?  ICD-10-CM   ?1. Type 2 diabetes mellitus with left diabetic foot ulcer ([place])  E11.621   ? L97.529   ?  ?2. Hammertoes of both feet  M20.41   ? M20.42   ?  ?3. Pre-ulcerative calluses  L84   ?  ?4. Cavus deformity of foot, acquired  M21.6X9   ?  ? ? ?Physician Treating Diabetes:  [name], MD ? ?Current or Previous Devices:   Historical user  ?  ?In-Person Foot Examination:  ?Ulcers & Callousing:   Preulcerative calluses ?Deformities:    Pes Cavus ?Sensation:    Compromised  ?Shoe Size:     13W ? ?ORTHOTIC RECOMMENDATION ?Recommended Devices: ?- 1x pair prefabricated PDAC approved diabetic shoes; Patient Selected Apex X821M White Sneaker Size 13W ?- 3x pair custom-to-patient PDAC approved vacuum formed diabetic insoles. ? ?GOALS OF SHOES AND INSOLES ?- Reduce shear and pressure ?- Reduce / Prevent callus formation ?- Reduce / Prevent ulceration ?- Protect the fragile healing compromised diabetic foot. ? ?Patient would benefit from diabetic shoes and inserts as patient has diabetes mellitus and the patient has one or more of the following conditions: ?- History of partial or complete amputation of the foot ?- History of previous foot ulceration. ?- History of pre-ulcerative callus ?- Peripheral neuropathy with evidence of callus formation ?- Foot deformity ?- Poor circulation ? ?ACTIONS PERFORMED ?Potential out of pocket cost was communicated to patient. Patient understood and consented to measurement and casting. Patient was casted for insoles via crush box and measured for shoes via brannock device. Procedure was explained and patient tolerated procedure well. All questions were answered and concerns addressed. Casts were shipped to central fabrication for HOLD until Certificate of Medical  Necessity or otherwise necessary authorization from insurance is obtained. ? ?PLAN ?Shoes are to be ordered and casts released from hold once all appropriate paperwork is complete. Patient is to be contacted and scheduled for fitting once shoes and insoles have been fabricated and received. ? ?

## 2022-04-02 DIAGNOSIS — E1121 Type 2 diabetes mellitus with diabetic nephropathy: Secondary | ICD-10-CM | POA: Diagnosis not present

## 2022-04-11 NOTE — Progress Notes (Signed)
Assessment/Plan:   1.  Essential Tremor.  -The patient is status post left VIM DBS on July 07, 2019 with [facility] device.  Intention was originally to do a bilateral VIM DBS, but the patient had a vagal episode in the operating room with associated nausea and vomiting with positive CSF, and it was decided not to attempt the other side.    -reprogrammed dbs today.  -Unfortunately, I think that the patient has developed ataxic tremor.  This is a very different tremor type and virtually impossible to control with DBS.  His tremor got worse after his May, 2022 infarct, which was positioned very close to the DBS lead in the thalamus.  I think that this likely affected the final cortical projection pathways and cerebellar outflow tracts.  That being said, he was much better after reprogramming today, but still had proximal right arm tremor.   2.  History of cerebral infarct, October, 2020 and May, 2022  -MRI in October, 2020 demonstrated acute right posterior limb of the internal capsule infarct.  However, stroke neurology felt that symptoms were more consistent with a posterior circulation stroke, related to thrombus in the left subclavian (although patient certainly does have mild left facial droop today).    -MRI in 2022 with small infarct in the thalamus on the left.  TEE was negative.  Patient was already on Eliquis (had just been restarted a few weeks before, as had been stopped for pericarditis).  -On atorvastatin, 80 mg, Zetia 10 mg.  LDL slightly above goal at 99 (goal less than 70)  -Patient's current hemoglobin A1c of 7.9, goal less than 7.0  -Patient currently on amlodipine 10, losartan 100 mg, Lopressor 25 mg twice per day -long-term blood pressure goal normotensive  -Patient on baby aspirin and Eliquis.  Understands risks of both of these together.   3.  Dysphagia  -Modified barium swallow in June, 2022 was worse compared to October, 2021 with evidence of pharyngeal phase  dysphagia.  4.  Hypertrophic cardiomyopathy  -Following with cardiology.     Subjective:   Joshua [name] was seen in follow-up in the movement disorder clinic for essential tremor.  Patient last seen in March.  Device reprogrammed done.  He reports today that he is having bothersome tremor on the right, especially when he goes to pick up a glass  Allergies  Allergen Reactions   Melatonin Other (See Comments)    dizzy    Current Outpatient Medications  Medication Instructions   amLODipine (NORVASC) 10 MG tablet TAKE ONE TABLET AT BEDTIME.   aspirin 81 MG EC tablet 1 tablet   atorvastatin (LIPITOR) 80 mg, Oral, Daily-1800   clindamycin (CLEOCIN) 150 mg, Oral, Every 6 hours   colchicine 0.6 MG tablet colchicine 0.6 mg tablet  TAKE ONE TABLET BY MOUTH TWICE DAILY.   Continuous Blood Gluc Sensor (FREESTYLE LIBRE 2 SENSOR) MISC See admin instructions   cycloSPORINE (RESTASIS) 0.05 % ophthalmic emulsion Ophthalmic   DULoxetine (CYMBALTA) 60 mg, Oral, Daily   Eliquis 5 mg, Oral, 2 times daily   escitalopram (LEXAPRO) 20 mg, Oral, Daily   ezetimibe (ZETIA) 10 MG tablet TAKE ONE TABLET BY MOUTH ONCE DAILY.   fenofibrate 54 mg, Oral, Daily   Fish Oil 2,000 mg, Oral, Daily, Reported on 02/13/2016   furosemide (LASIX) 40 mg, Oral, As needed   insulin lispro (HUMALOG) 15-25 Units, Subcutaneous, See admin instructions, Inject 15 units subcutaneously after breakfast and 25 units after lunch and supper  losartan (COZAAR) 100 MG tablet 1 tablet, Oral, Daily   Lutein-Zeaxanthin 25-5 MG CAPS 1 tablet, Oral, Daily   metFORMIN (GLUCOPHAGE) 500 MG tablet 1 tablet with a meal   metFORMIN (GLUCOPHAGE) 500 mg, Oral, Daily with breakfast   metoprolol succinate (TOPROL-XL) 50 mg, Oral, Daily, Take with or immediately following a meal.   mirtazapine (REMERON) 7.5 mg, Oral, Daily at bedtime   Multiple Vitamin (MULTIVITAMIN WITH MINERALS) TABS tablet 1 tablet, Oral, Daily   ONETOUCH VERIO test strip No  dose, route, or frequency recorded.   Polyethyl Glycol-Propyl Glycol (SYSTANE OP) 1-2 drops, Both Eyes, 4 times daily PRN   PRESCRIPTION MEDICATION Inhalation, Daily at bedtime, CPAP    RESTASIS 0.05 % ophthalmic emulsion 1 drop, Both Eyes, 2 times daily PRN   SURE COMFORT PEN NEEDLES 31G X 5 MM MISC No dose, route, or frequency recorded.   Toujeo SoloStar 80 Units, Subcutaneous, 2 times daily     Objective:   VITALS:   There were no vitals filed for this visit.     Gen:  Appears stated age and in NAD. HEENT:  Normocephalic, atraumatic. The mucous membranes are moist.     NEUROLOGICAL:  Orientation:  The patient is alert and oriented x 3.   Cranial nerves: There is good facial symmetry. Extraocular muscles are intact and visual fields are full to confrontational testing.  No significant dysphasia (has had this in the past) or dysarthria today.  Soft palate rises symmetrically and there is no tongue deviation. Hearing is intact to conversational tone. Tone: Tone is good throughout. Motor:  5/5 in the UE/LE   MOVEMENT EXAM: Movements: With the device off, there is very severe tremor on the right.  Before programming, with the device on, the patient had mild to moderate tremor on the right, with an ataxic component.  Post programming, patient had no tremor of the outstretched hands.  He did have very mild tremor when he approached his body.    I have reviewed and interpreted the following labs independently   Chemistry      Component Value Date/Time   NA 142 01/16/2022 1153   NA 141 03/26/2021 1632   K 4.0 01/16/2022 1153   CL 106 01/16/2022 1153   CO2 27 01/16/2022 1153   BUN 23 01/16/2022 1153   BUN 32 (H) 03/26/2021 1632   CREATININE 1.59 (H) 01/16/2022 1153      Component Value Date/Time   CALCIUM 9.2 01/16/2022 1153   ALKPHOS 77 04/22/2021 0054   AST 21 04/22/2021 0054   ALT 29 04/22/2021 0054   BILITOT 0.6 04/22/2021 0054   BILITOT 0.4 10/24/2020 0943       Lab Results  Component Value Date   WBC 9.9 01/16/2022   HGB 13.2 01/16/2022   HCT 40.0 01/16/2022   MCV 88.5 01/16/2022   PLT 229 01/16/2022   Lab Results  Component Value Date   TSH 1.421 01/30/2021     CC:  [name], MD

## 2022-04-14 ENCOUNTER — Ambulatory Visit: Payer: Medicare PPO | Admitting: Neurology

## 2022-04-14 ENCOUNTER — Encounter: Payer: Self-pay | Admitting: Neurology

## 2022-04-14 DIAGNOSIS — G250 Essential tremor: Secondary | ICD-10-CM | POA: Diagnosis not present

## 2022-04-14 NOTE — Procedures (Signed)
DBS Programming was performed.    Manufacturer of DBS device: [employer] was performed.    Manufacturer of DBS device: [employer] - no bluetooth  Total time spent programming was 38 minutes.  Device was confirmed to be on.  Soft start was confirmed to be on.  Impedences were checked and were within normal limits.  Battery was checked and was determined to be functioning normally and not near the end of life.  However, he did have evidence that on one occasion, the battery had gotten pretty low before charging and discussed not to let it get into the "red."   Final settings were as follows, with program 2 being active:   Active Contacts Amplitude (mA) PW (ms) Frequency (hz)   Left Brain       Program 1       11/28/2019 5-C+ 2.5 60 130   04/25/20 5-C+ 2.7 60 130   10/25/20 5-C+ 3.5(2.1-3.8) 60 130                               Program 2       11/28/19 5-8+ 2.2 60 130   04/25/20 5-8+ 3.2 60 130                         10/25/20 5-8+ 3.6 (3.0-4.0) 70 130   03/26/21 5-8+ 4.1 (3.0-4.4) 90 130   09/26/21 2-(40%)6-(60%)8+ 3.6 90 154 Higher frequency with tongue paresthesia  11/11/21 6-(50%)7-(50%)8+ 3.8 100 170   01/28/22 3-(50%)6-(50%)8+ 4.0 80 170 Mild lip paresthesia  04/14/22 3-(50%)6-(50%)8+ 4.1 90 179                               Right Brain       Not active                        Prior visits:  Monopolar review: Left brain electrode:     1-C+           ; Amplitude  1.0   ma   ; Pulse width 60 microseconds;   Frequency   130   Hz.  (Tongue paresthesias) Left brain electrode:     (2-3-4-) 33% each C+           ; Amplitude  2.0   ma   ; Pulse width 60 microseconds;   Frequency   130   Hz.  (Tongue and lip paresthesias, fairly good tremor control) Left brain electrode:     (5-6-7-) 33% C+           ; Amplitude  2.0   ma   ; Pulse width 60 microseconds;   Frequency   130   Hz.  (Tongue paresthesias but no lip paresthesia and good tremor control) Left brain electrode:      8-C+           ; Amplitude  1.0   ma   ; Pulse width 60 microseconds;   Frequency   130   Hz.  (Paresthesias resolved)

## 2022-04-24 ENCOUNTER — Telehealth: Payer: Self-pay

## 2022-04-24 NOTE — Telephone Encounter (Signed)
CMN received - casts released and shoes ordered  Apex X821M 13W

## 2022-04-29 DIAGNOSIS — E1129 Type 2 diabetes mellitus with other diabetic kidney complication: Secondary | ICD-10-CM | POA: Diagnosis not present

## 2022-04-29 DIAGNOSIS — I6990 Unspecified sequelae of unspecified cerebrovascular disease: Secondary | ICD-10-CM | POA: Diagnosis not present

## 2022-04-29 DIAGNOSIS — I422 Other hypertrophic cardiomyopathy: Secondary | ICD-10-CM | POA: Diagnosis not present

## 2022-04-29 DIAGNOSIS — R0600 Dyspnea, unspecified: Secondary | ICD-10-CM | POA: Diagnosis not present

## 2022-04-29 DIAGNOSIS — N1831 Chronic kidney disease, stage 3a: Secondary | ICD-10-CM | POA: Diagnosis not present

## 2022-04-29 DIAGNOSIS — Z794 Long term (current) use of insulin: Secondary | ICD-10-CM | POA: Diagnosis not present

## 2022-04-29 DIAGNOSIS — I129 Hypertensive chronic kidney disease with stage 1 through stage 4 chronic kidney disease, or unspecified chronic kidney disease: Secondary | ICD-10-CM | POA: Diagnosis not present

## 2022-04-29 DIAGNOSIS — E1121 Type 2 diabetes mellitus with diabetic nephropathy: Secondary | ICD-10-CM | POA: Diagnosis not present

## 2022-04-30 ENCOUNTER — Other Ambulatory Visit: Payer: Self-pay | Admitting: Cardiology

## 2022-04-30 DIAGNOSIS — I639 Cerebral infarction, unspecified: Secondary | ICD-10-CM

## 2022-04-30 DIAGNOSIS — I2699 Other pulmonary embolism without acute cor pulmonale: Secondary | ICD-10-CM

## 2022-04-30 NOTE — Telephone Encounter (Signed)
Prescription refill request for Eliquis received. Indication: CVA Last office visit: 02/12/22 Gardiner Rhyme)  Scr: 1.59 (01/16/22) Age: 71 Weight: 118.8kg  Appropriate dose and refill sent to requested pharmacy.

## 2022-05-01 DIAGNOSIS — D485 Neoplasm of uncertain behavior of skin: Secondary | ICD-10-CM | POA: Diagnosis not present

## 2022-05-01 DIAGNOSIS — D0439 Carcinoma in situ of skin of other parts of face: Secondary | ICD-10-CM | POA: Diagnosis not present

## 2022-05-01 DIAGNOSIS — L570 Actinic keratosis: Secondary | ICD-10-CM | POA: Diagnosis not present

## 2022-05-01 DIAGNOSIS — L821 Other seborrheic keratosis: Secondary | ICD-10-CM | POA: Diagnosis not present

## 2022-05-01 DIAGNOSIS — Z85828 Personal history of other malignant neoplasm of skin: Secondary | ICD-10-CM | POA: Diagnosis not present

## 2022-05-03 DIAGNOSIS — E1121 Type 2 diabetes mellitus with diabetic nephropathy: Secondary | ICD-10-CM | POA: Diagnosis not present

## 2022-05-12 ENCOUNTER — Other Ambulatory Visit: Payer: Self-pay | Admitting: *Deleted

## 2022-05-12 DIAGNOSIS — I639 Cerebral infarction, unspecified: Secondary | ICD-10-CM

## 2022-05-12 DIAGNOSIS — I2699 Other pulmonary embolism without acute cor pulmonale: Secondary | ICD-10-CM

## 2022-05-12 MED ORDER — APIXABAN 5 MG PO TABS: 5.0000 mg | ORAL_TABLET | Freq: Two times a day (BID) | ORAL | 2 refills | Status: DC

## 2022-05-12 NOTE — Telephone Encounter (Signed)
Eliquis '5mg'$  refill request received. Patient is 71 years old, weight-118.8kg, Crea-1.59 on 01/16/2022, Diagnosis-CVA, and last seen by Dr. Gardiner Rhyme on 02/12/2022. Dose is appropriate based on dosing criteria. Will send in refill to requested pharmacy.    Last refill sent to local pharmacy this is to mail order.

## 2022-05-13 DIAGNOSIS — E119 Type 2 diabetes mellitus without complications: Secondary | ICD-10-CM | POA: Diagnosis not present

## 2022-05-13 DIAGNOSIS — H40013 Open angle with borderline findings, low risk, bilateral: Secondary | ICD-10-CM | POA: Diagnosis not present

## 2022-05-13 DIAGNOSIS — H04123 Dry eye syndrome of bilateral lacrimal glands: Secondary | ICD-10-CM | POA: Diagnosis not present

## 2022-05-13 DIAGNOSIS — H353121 Nonexudative age-related macular degeneration, left eye, early dry stage: Secondary | ICD-10-CM | POA: Diagnosis not present

## 2022-05-15 ENCOUNTER — Telehealth: Payer: Self-pay | Admitting: Neurology

## 2022-05-15 NOTE — Telephone Encounter (Signed)
Pt called in stating he has a DBS and cannot find his charger. It has not been charged for 8 days now. He doesn't know what to do.

## 2022-05-15 NOTE — Telephone Encounter (Signed)
Called patient and informed him that he will need to contact [employer].  We don't have chargers.  They will send him one. Patient stated he will give them a call. Patient had no further questions or concerns.

## 2022-05-29 ENCOUNTER — Telehealth: Payer: Self-pay | Admitting: Neurology

## 2022-05-29 NOTE — Telephone Encounter (Signed)
Called patient and he informed me that his right hand and arm have been shaking uncontrollably. I asked patient how long this has been going on? He stated that its been on going for awhile but just recently it started to become uncontrollable. Patient stated his DBS is not helping.   I informed patient that Dr. Carles Collet is not in office but I would send his message to the covering physicians. Patient verbalized understanding and had no further questions or concerns.

## 2022-05-29 NOTE — Telephone Encounter (Signed)
Patient called and stated that his shaking of his arm is getting really bad.  He wanted to know if there is anything that can be done?

## 2022-05-30 ENCOUNTER — Ambulatory Visit (HOSPITAL_BASED_OUTPATIENT_CLINIC_OR_DEPARTMENT_OTHER): Payer: Medicare PPO | Admitting: Family

## 2022-05-30 NOTE — Progress Notes (Deleted)
Office Visit    Patient Name: Joshua [name] Date of Encounter: 05/30/2022  PCP:  [name], [place]  Cardiologist:  Donato Heinz, MD  Advanced Practice Provider:  No care team member to display Electrophysiologist:  None      Chief Complaint    Joshua [name] is a 71 y.o. male    presents today for fatigue  Past Medical History    Past Medical History:  Diagnosis Date   Benign essential tremor    Benign positional vertigo    CVA (cerebral vascular accident) ([place])    x2 - L retina, 1 right parietal   Degenerative arthritis    Depression    Diabetes mellitus    DVT (deep venous thrombosis) ([place]) 2018   Dyslipidemia    GERD (gastroesophageal reflux disease)    hiatal hernia   Gout    H/O: vasectomy    Hearing aid worn    b/l   Hx of appendectomy    Hx of tonsillectomy    Hypertension    Hypertrophic cardiomyopathy ([place])    Ischemic optic neuropathy    on the left   Melanoma ([place])    NSVT (nonsustained ventricular tachycardia) ([place])    1 4 beat run on event monitor in 07/2020   Obesity    OSA on CPAP    setting = 5   Pulmonary emboli ([place]) 2018   PVC's (premature ventricular contractions)    SVT (supraventricular tachycardia) ([place])    by event monitor   Tremor, essential 06/22/2017   Wears glasses    Past Surgical History:  Procedure Laterality Date   APPENDECTOMY     arthroscopic knee surgery Bilateral    CATARACT EXTRACTION Bilateral    COLONOSCOPY     MINOR PLACEMENT OF FIDUCIAL N/A 06/30/2019   Procedure: Fiducial placement;  Surgeon: Erline Levine, MD;  Location: [place];  Service: Neurosurgery;  Laterality: N/A;  Fiducial placement   NASAL SEPTUM SURGERY     PULSE GENERATOR IMPLANT N/A 07/14/2019   Procedure: Left cranial Implanted Pulse Generator and lead extension placement to right chest ;  Surgeon: Erline Levine, MD;  Location: [place];  Service: Neurosurgery;  Laterality: N/A;   SUBTHALAMIC STIMULATOR  INSERTION Left 07/07/2019   Procedure: LEFT DEEP BRAIN STIMULATOR PLACEMENT;  Surgeon: Erline Levine, MD;  Location: [place];  Service: Neurosurgery;  Laterality: Left;   TEE WITHOUT CARDIOVERSION N/A 04/23/2021   Procedure: TRANSESOPHAGEAL ECHOCARDIOGRAM (TEE);  Surgeon: Acie Fredrickson Wonda Cheng, MD;  Location: [facility];  Service: Cardiovascular;  Laterality: N/A;   TONSILLECTOMY     TOTAL KNEE ARTHROPLASTY Left 03/30/2017   Procedure: LEFT TOTAL KNEE ARTHROPLASTY;  Surgeon: Paralee Cancel, MD;  Location: [facility];  Service: Orthopedics;  Laterality: Left;   WISDOM TOOTH EXTRACTION      Allergies  Allergies  Allergen Reactions   Melatonin Other (See Comments)    dizzy    History of Present Illness    Joshua [name] is a 71 y.o. male with a hx of HCM, hypertension, diabetes, OSA, PE after knee surgery, essential tremor s/p DBS June 2020, CVA last seen 02/12/2022.  Admitted to [facility] October 2020 with acute CVA.  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  MRI brain with right internal capsule infarct.  Echo no cardiogenic source of embolism.  Started on heparin given subclavian artery thrombosis and transition to Eliquis.  Unclear if  thrombus is due to cardiac source of developed in situ.  Stroke team recommended Eliquis for 2 months and repeat CAT at neck.  TTE incidental finding asymmetric basal septal hypertrophy.  Cardiac MRI with basal septal hypertrophy up to 20 mm (lateral wall 12 mm) consistent with HCM.  PYP and light chains with no evidence of amyloid.  Cardiac monitor no VT, occasional PVC 1.3% burden.  Repeat monitor 07/2020 with one 4 beat run NSVT, 6 runs SVT longest 16 beats, occasional PVC 4%.  Admitted March 2022 with shortness of breath with pericardial effusion started on colchicine Eliquis held-no recommended for pericardiocentesis.  Repeat echo 02/2021 pericardial effusion had resolved, EF 55 to 76%, grade 1 diastolic dysfunction, moderate  LVH, strain abnormality suggestive of cardiac amyloidosis.  Myoview 01/2021 no evidence of ischemia, EF 53%.  Last seen 02/12/2022 by Dr. Gardiner Rhyme.  Noted dyspnea with minimal exertion but no chest pain.  Lopressor transition to Toprol.  Repeat echo 01/2022 EF 60 to 65%, no RWMA, moderate LVH, RV normal size and function, trivial MR, mild dilation ascending aorta 42 mm.  EKGs/Labs/Other Studies Reviewed:   The following studies were reviewed today:  TTE 01/2022  1. Left ventricular ejection fraction, by estimation, is 60 to 65%. The  left ventricle has normal function. The left ventricle has no regional  wall motion abnormalities. There is moderate concentric left ventricular  hypertrophy. Left ventricular  diastolic parameters are indeterminate.   2. Right ventricular systolic function is normal. The right ventricular  size is normal. Tricuspid regurgitation signal is inadequate for assessing  PA pressure.   3. The mitral valve is normal in structure. Trivial mitral valve  regurgitation. No evidence of mitral stenosis.   4. The aortic valve is tricuspid. Aortic valve regurgitation is not  visualized. Aortic valve sclerosis/calcification is present, without any  evidence of aortic stenosis.   5. Aortic dilatation noted. There is mild dilatation of the ascending  aorta, measuring 42 mm.   6. Compared to study dated 01/30/2021, the pericardial effusion has  resolved.   TEE 04/23/2021: 1. Left ventricular ejection fraction, by estimation, is 60 to 65%. The  left ventricle has normal function.   2. Right ventricular systolic function is normal. The right ventricular  size is normal.   3. No left atrial/left atrial appendage thrombus was detected.   4. Systolic anterior motion of the mitral valve leaflets is present . The  mitral valve is normal in structure. No evidence of mitral valve  regurgitation.   5. The aortic valve is normal in structure. Aortic valve regurgitation is  not  visualized.    Echo 03/08/2021:  1. Although overall left ventricular GLS is normal, the distribution of  strain abnormalities is strongly suggestive of cardiac amyloidosis  ("cherry on top" pattern). Left ventricular ejection fraction, by  estimation, is 55 to 60%. The left ventricle has   normal function. The left ventricle has no regional wall motion  abnormalities. There is moderate concentric left ventricular hypertrophy.  Left ventricular diastolic parameters are consistent with Grade I  diastolic dysfunction (impaired relaxation). The  average left ventricular global longitudinal strain is -19.1 %. The global  longitudinal strain is normal.   2. Right ventricular systolic function is normal. The right ventricular  size is normal.   3. The mitral valve is normal in structure. No evidence of mitral valve  regurgitation.   4. The aortic valve is tricuspid. Aortic valve regurgitation is not  visualized. Mild to moderate aortic valve  sclerosis/calcification is  present, without any evidence of aortic stenosis.   5. Aortic dilatation noted. There is moderate dilatation of the ascending  aorta, measuring 45 mm.   Comparison(s): No significant change from prior study. Prior images  reviewed side by side. Pericardial effusion has resolved. Note GLS pattern  and marked LVH suggestive of cardiac amyloidosis, but also note normal  Tc48mPYP scan. Consider evaluation for  light chain disease if not yet performed.   Lexiscan Myoview 02/12/2021: Nuclear stress EF: 52%. There was no ST segment deviation noted during stress. This is a low risk study with no evidence of ischemia. The left ventricular ejection fraction is mildly decreased (45-54%). Visually, systolic function appears normal.   Echo 01/30/2021: 1. Large pericardial effusion. The pericardial effusion is anterior to  the right ventricle. There is no evidence of increased pericardial  pressure. Echodensity within the effusion may  suggest exudative nature.   2. Left ventricular ejection fraction, by estimation, is 55 to 60%. The  left ventricle has normal function. Left ventricular endocardial border  not optimally defined to evaluate regional wall motion. There is moderate  concentric left ventricular  hypertrophy. Left ventricular diastolic parameters are indeterminate.   3. Right ventricular systolic function was not well visualized. The right  ventricular size is not well visualized.   4. The mitral valve was not well visualized. No evidence of mitral valve  regurgitation.   5. The aortic valve was not well visualized. Aortic valve regurgitation  is not visualized. No aortic stenosis is present.   6. Aortic dilatation noted. There is mild dilatation of the aortic root,  measuring 40 mm.   7. The inferior vena cava is normal in size with <50% respiratory  variability, suggesting right atrial pressure of 8 mmHg.   Comparison(s): A prior study was performed on 08/29/2019. Prior images  reviewed side by side. Difficult comparsion- this study is much more  technically difficult. Significant increase in pericardial effusion.  Primary cardiology team made aware.   8 day Zio Monitor 08/22/2020: One 4 beat run of NSVT 6 runs of SVT, longest lasting 16 beats Occasional PVCs (3.9%) 8 days of data recorded on Zio monitor. Patient had a min HR of 62 bpm, max HR of 162 bpm, and avg HR of 86 bpm. Predominant underlying rhythm was Sinus Rhythm. No atrial fibrillation, high degree block, or pauses noted. One 4 beat run of NSVT.  6 runs of SVT, longest lasting 16 beats.  Isolated atrial ectopy was rare (<1%).  Isolated ventricular ectopy was occasional (3.9%).  There were 0 triggered events.      Cardiac MRI 10/07/19: 1. Limited study, as only a few sequences were able to be completed due to limitations from presence of deep brain stimulator 2. Basal septal hypertrophy measuring up to 261m(lateral wall 12 mm), consistent  with hypertrophic cardiomyopathy 3. Patchy late gadolinium enhancement in basal septum, consistent with HCM 4. Basal inferolateral midwall LGE, which would not be a typical pattern for HCM, as more commonly seen in setting of prior myocarditis or sarcoidosis. 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  Normal LV size with hyperdynamic systolic function (EF 6946%6.  Normal RV size and systolic function (EF 6196%  TTE 08/29/19:  1. Technically difficult study. Left ventricular ejection fraction appears grossly normal, approximately 55-60%, though difficult visualization even with contrast  2. There is asymmetric basal septal hypertrophy measuring 18 mm in basal septum (12 mm posterior wall).  Consider cardiac MRI to assess for hypertrophic cardiomyopathy if clinically indicated  3. Definity contrast agent was given IV to delineate the left ventricular endocardial borders.  4. Global right ventricle has normal systolic function.The right ventricular size is normal. No increase in right ventricular wall thickness.  5. There is mild dilatation of the aortic root measuring 39 mm.  6. The inferior vena cava is dilated in size with <50% respiratory variability, suggesting right atrial pressure of 15 mmHg.   PYP scan 10/25/19: The study is normal. No evidence of TTR amyloidosis.   Cardiac monitor 11/11/19: No significant abnormalities No atrial fibrillation. No VT Occasional PVCs (1.3% of beats). 1 patient triggered event, which appears to correspond to short pause (1.1 seconds) from a blocked PA   Predominant rhythm is sinus rhythm. Range is 57 to 137 bpm with average of 89 bpm. No atrial fibrillation, sustained ventricular tachycardia, significant pause, or high degree AV block. Occasional PVCs (1.3% of beats). 1 patient triggered events. Triggered event appears to correspond to short pause (1.1 seconds) from a blocked PAC.  No significant  abnormalities.  EKG: No EKG today  Recent Labs: 01/16/2022: BUN 23; Creatinine, Ser 1.59; Hemoglobin 13.2; Platelets 229; Potassium 4.0; Sodium 142  Recent Lipid Panel    Component Value Date/Time   CHOL 110 04/23/2021 0717   CHOL 161 10/24/2020 0943   TRIG 172 (H) 04/23/2021 0717   HDL 23 (L) 04/23/2021 0717   HDL 30 (L) 10/24/2020 0943   CHOLHDL 4.8 04/23/2021 0717   VLDL 34 04/23/2021 0717   LDLCALC 53 04/23/2021 0717   Ivins 99 10/24/2020 0943     Home Medications   No outpatient medications have been marked as taking for the 05/30/22 encounter (Appointment) with Loel Dubonnet, NP.     Review of Systems   ***   All other systems reviewed and are otherwise negative except as noted above.  Physical Exam    VS:  There were no vitals taken for this visit. , BMI There is no height or weight on file to calculate BMI.  Wt Readings from Last 3 Encounters:  04/14/22 262 lb (118.8 kg)  02/17/22 256 lb (116.1 kg)  02/12/22 258 lb 12.8 oz (117.4 kg)     GEN: Well nourished, well developed, in no acute distress. HEENT: normal. Neck: Supple, no JVD, carotid bruits, or masses. Cardiac: ***RRR, no murmurs, rubs, or gallops. No clubbing, cyanosis, edema.  ***Radials/PT 2+ and equal bilaterally.  Respiratory:  ***Respirations regular and unlabored, clear to auscultation bilaterally. GI: Soft, nontender, nondistended. MS: No deformity or atrophy. Skin: Warm and dry, no rash. Neuro:  Strength and sensation are intact. Psych: Normal affect.  Assessment & Plan     Fatigue-  HCM-  Pericardial effusion -large effusion 01/2021.  Treated with colchicine.  Echo 02/2021 resolution of effusion.  No evidence of effusion by echo 01/2022. History of CVA -subclavian artery thrombosis diagnosed unclear if cardioembolic source or in situ.  No AF on monitor.*** Hypertension-  Hyperlipidemia -LDL goal less than 70. DM2 -  PVC -  CAD -calcium score 588 (68th percentile).      Disposition: Follow up {follow up:15908} with Donato Heinz, MD or APP.  Signed, Loel Dubonnet, NP 05/30/2022, 8:25 AM [place]

## 2022-06-02 DIAGNOSIS — E1121 Type 2 diabetes mellitus with diabetic nephropathy: Secondary | ICD-10-CM | POA: Diagnosis not present

## 2022-06-03 ENCOUNTER — Telehealth: Payer: Self-pay | Admitting: Cardiology

## 2022-06-03 NOTE — Telephone Encounter (Signed)
Pt c/o Shortness Of Breath: STAT if SOB developed within the last 24 hours or pt is noticeably SOB on the phone  1. Are you currently SOB (can you hear that pt is SOB on the phone)?   Yes  2. How long have you been experiencing SOB?   Since stroke but becoming more frequent  3. Are you SOB when sitting or when up moving around?   Mostly when moving around  4. Are you currently experiencing any other symptoms?  No    Patient called stating he is getting increased SOB and at times he feels absolutely exhausted.

## 2022-06-03 NOTE — Telephone Encounter (Signed)
Called pt to discuss his symptoms. Pt was confused who he is seeing Friday. Since he is seeing Joshua Montana, NP on Friday he does not want to discuss anything at this point. He states "thank you for relieving this confusion. I will dance at your wedding."

## 2022-06-05 NOTE — Telephone Encounter (Signed)
Called patient and left a message for a call back.  

## 2022-06-05 NOTE — Telephone Encounter (Signed)
Patient returned call and was informed of Dr. Doristine Devoid response:   I'm not sure that there is a lot to do.  I know he has bad tremor now, but this really developed after his stroke (was well controlled until them) and I think that the damage from the stroke produced ataxic tremor, which is different than his essential tremor and is very hard to control.  I'm happy to send him to another center for an opinion if he would like.   Patient verbalized understanding and thanked me for the call.

## 2022-06-06 ENCOUNTER — Ambulatory Visit (HOSPITAL_BASED_OUTPATIENT_CLINIC_OR_DEPARTMENT_OTHER): Payer: Medicare PPO | Admitting: Family

## 2022-06-26 ENCOUNTER — Ambulatory Visit: Payer: Medicare PPO | Admitting: Podiatry

## 2022-06-27 ENCOUNTER — Telehealth: Payer: Self-pay | Admitting: Podiatry

## 2022-06-27 NOTE — Telephone Encounter (Signed)
LVM FOR PT TO CALL BACK TO GET SCHEDULED ASAP FOR DM SHOE PICK UP

## 2022-06-30 ENCOUNTER — Ambulatory Visit (INDEPENDENT_AMBULATORY_CARE_PROVIDER_SITE_OTHER): Payer: Medicare PPO | Admitting: *Deleted

## 2022-06-30 DIAGNOSIS — E11621 Type 2 diabetes mellitus with foot ulcer: Secondary | ICD-10-CM

## 2022-06-30 DIAGNOSIS — M2042 Other hammer toe(s) (acquired), left foot: Secondary | ICD-10-CM

## 2022-06-30 DIAGNOSIS — L97529 Non-pressure chronic ulcer of other part of left foot with unspecified severity: Secondary | ICD-10-CM

## 2022-06-30 DIAGNOSIS — M2041 Other hammer toe(s) (acquired), right foot: Secondary | ICD-10-CM | POA: Diagnosis not present

## 2022-06-30 DIAGNOSIS — L84 Corns and callosities: Secondary | ICD-10-CM | POA: Diagnosis not present

## 2022-06-30 DIAGNOSIS — L97521 Non-pressure chronic ulcer of other part of left foot limited to breakdown of skin: Secondary | ICD-10-CM | POA: Diagnosis not present

## 2022-06-30 NOTE — Progress Notes (Signed)
Patient presents today to pick up diabetic shoes and insoles.  Patient was dispensed 1 pair of diabetic shoes and 3 pairs of foam casted diabetic insoles. Fit was satisfactory. Instructions for break-in and wear was reviewed and a copy was given to the patient.   Re-appointment for regularly scheduled diabetic foot care visits or if they should experience any trouble with the shoes or insoles.  

## 2022-07-03 DIAGNOSIS — E1121 Type 2 diabetes mellitus with diabetic nephropathy: Secondary | ICD-10-CM | POA: Diagnosis not present

## 2022-07-23 DIAGNOSIS — E785 Hyperlipidemia, unspecified: Secondary | ICD-10-CM | POA: Diagnosis not present

## 2022-07-23 DIAGNOSIS — E1121 Type 2 diabetes mellitus with diabetic nephropathy: Secondary | ICD-10-CM | POA: Diagnosis not present

## 2022-07-23 DIAGNOSIS — Z794 Long term (current) use of insulin: Secondary | ICD-10-CM | POA: Diagnosis not present

## 2022-07-23 DIAGNOSIS — I129 Hypertensive chronic kidney disease with stage 1 through stage 4 chronic kidney disease, or unspecified chronic kidney disease: Secondary | ICD-10-CM | POA: Diagnosis not present

## 2022-07-23 DIAGNOSIS — N1831 Chronic kidney disease, stage 3a: Secondary | ICD-10-CM | POA: Diagnosis not present

## 2022-07-28 NOTE — Progress Notes (Deleted)
Cardiology Office Note:    Date:  07/28/2022   ID:  DARRY KELNHOFER, DOB 01-Apr-1951, MRN [id number]  PCP:  [name], MD  Cardiologist:  Donato Heinz, MD  Electrophysiologist:  None   Referring MD: [name], MD   No chief complaint on file.   History of Present Illness:     Joshua [name] is a 71 y.o. male with a hx of HCM, hypertension, diabetes, OSA, PE after knee surgery, essential tremor status post DBS June 2020, CVA who presents for follow-up.  Admitted to [facility] from 08/28/19 through 08/31/19 with an acute CVA.  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  MRI brain showed right internal capsule infarct.  Echocardiogram showed no cardiogenic source of embolism.  He was started on heparin given subclavian artery thrombosis, and this was transitioned to Eliquis.  It was unclear if the thrombosis was due to a cardiac source or developed in situ.  Stroke team recommended Eliquis for 2 months and repeating CTA neck; if CTA neck negative and no atrial fibrillation noted on 30-day monitor, stroke team recommended stopping Eliquis and resuming antiplatelet agent.  TTE was notable for an incidental finding of asymmetric basal septal hypertrophy.  He was referred to cardiology and seen on 09/19/19.  Cardiac MRI was ordered, which showed Basal septal hypertrophy measuring up to 75m (lateral wall 157m, consistent with hypertrophic cardiomyopathy.  PYP scan showed no evidence of amyloid.  Cardiac monitor showed no VT, occasional PVCs (1.3% of beats).   Repeat monitor on 08/22/2020 showed 1 4 beat run of NSVT, 6 runs of SVT longest lasting 16 beats, occasional PVCs (4%).  He denies any family history of HCM.  Does have vertigo.  No syncope except with CVA years ago.  [facility] with shortness of breath and elevated effort for cardiac fusion.  Started on colchicine and Eliquis was held.  No adequate window for  pericardiocentesis.  Repeat echo 03/08/2021 showed pericardial effusion had resolved, LVEF 55 to 6088%grade 1 diastolic dysfunction, moderate LVH, strain abnormalities suggestive of cardiac amyloidosis.  Lexiscan Myoview on 02/12/2021 showed no evidence of ischemia, EF 53%.  Echocardiogram 02/21/2022 showed normal biventricular function, moderate LVH, no significant valvular disease, ascending aortic dilatation measuring 42 mm.  Since last clinic visit,  he reports that he has been doing okay.  Has been having dyspnea with minimal exertion.  Denies any chest pain.  Denies any lightheadedness, syncope, lower extremity edema, or palpitations.   Wt Readings from Last 3 Encounters:  04/14/22 262 lb (118.8 kg)  02/17/22 256 lb (116.1 kg)  02/12/22 258 lb 12.8 oz (117.4 kg)    Past Medical History:  Diagnosis Date   Benign essential tremor    Benign positional vertigo    CVA (cerebral vascular accident) ([place]   x2 - L retina, 1 right parietal   Degenerative arthritis    Depression    Diabetes mellitus    DVT (deep venous thrombosis) ([facility]   Dyslipidemia    GERD (gastroesophageal reflux disease)    hiatal hernia   Gout    H/O: vasectomy    Hearing aid worn    b/l   Hx of appendectomy    Hx of tonsillectomy    Hypertension    Hypertrophic cardiomyopathy (H[facility]   Ischemic optic neuropathy    on the left   Melanoma ([place]   NSVT (  nonsustained ventricular tachycardia) (HCC)    1 4 beat run on event monitor in 07/2020   Obesity    OSA on CPAP    setting = 5   Pulmonary emboli ([place]) 2018   PVC's (premature ventricular contractions)    SVT (supraventricular tachycardia) (HCC)    by event monitor   Tremor, essential 06/22/2017   Wears glasses     Past Surgical History:  Procedure Laterality Date   APPENDECTOMY     arthroscopic knee surgery Bilateral    CATARACT EXTRACTION Bilateral    COLONOSCOPY     MINOR PLACEMENT OF FIDUCIAL N/A 06/30/2019   Procedure: Fiducial placement;   Surgeon: Erline Levine, MD;  Location: [place];  Service: Neurosurgery;  Laterality: N/A;  Fiducial placement   NASAL SEPTUM SURGERY     PULSE GENERATOR IMPLANT N/A 07/14/2019   Procedure: Left cranial Implanted Pulse Generator and lead extension placement to right chest ;  Surgeon: Erline Levine, MD;  Location: [place];  Service: Neurosurgery;  Laterality: N/A;   SUBTHALAMIC STIMULATOR INSERTION Left 07/07/2019   Procedure: LEFT DEEP BRAIN STIMULATOR PLACEMENT;  Surgeon: Erline Levine, MD;  Location: [place];  Service: Neurosurgery;  Laterality: Left;   TEE WITHOUT CARDIOVERSION N/A 04/23/2021   Procedure: TRANSESOPHAGEAL ECHOCARDIOGRAM (TEE);  Surgeon: Acie Fredrickson Wonda Cheng, MD;  Location[facility];  Service: Cardiovascular;  Laterality: N/A;   TONSILLECTOMY     TOTAL KNEE ARTHROPLASTY Left 03/30/2017   Procedure: LEFT TOTAL KNEE ARTHROPLASTY;  Surgeon: Paralee Cancel, MD;  Location: [facility];  Service: Orthopedics;  Laterality: Left;   WISDOM TOOTH EXTRACTION      Current Medications: No outpatient medications have been marked as taking for the 07/29/22 encounter (Appointment) with Donato Heinz, MD.     Allergies:   Melatonin   Social History   Socioeconomic History   Marital status: Married    Spouse name: [name]   Number of children: 2   Years of education: Bachelors    Highest education level: Bachelor's degree (e.g., BA, AB, BS)  Occupational History   Occupation: retired    Fish farm manager: [employer]    Comment: teaching/coaching  Tobacco Use   Smoking status: Former    Packs/day: 1.00    Years: 10.00    Total pack years: 10.00    Types: Cigarettes    Quit date: 11/25/1983    Years since quitting: 38.6   Smokeless tobacco: Never  Vaping Use   Vaping Use: Never used  Substance and Sexual Activity   Alcohol use: Yes    Alcohol/week: 2.0 standard drinks of alcohol    Types: 2 Standard drinks or equivalent per week    Comment: occassionally   Drug use: No   Sexual  activity: Not on file  Other Topics Concern   Not on file  Social History Narrative   Patient lives at home with wife. [name](   Patient has 2 children that are in good health.    Patient works for [employer]. Retired .   Patient has a Bachelors degree in History.       Right handed    Lives in one story home - Handicap accessible    Social Determinants of Health   Financial Resource Strain: Not on file  Food Insecurity: Not on file  Transportation Needs: Not on file  Physical Activity: Not on file  Stress: Not on file  Social Connections: Not on file     Family History: The patient's family history  includes Alzheimer's disease in his father; Cerebral aneurysm in his mother; Diabetes in his maternal grandmother; Healthy in his son; Tremor in his brother, maternal uncle, and mother. There is no history of Colon cancer.  ROS:   Please see the history of present illness.     All other systems reviewed and are negative.  EKGs/Labs/Other Studies Reviewed:    The following studies were reviewed today:   EKG:   02/12/22: Sinus rhythm, first-degree AV block, rate 72, left axis deviation, poor R wave progression, Q waves in inferior leads 04/30/2021: sinus rhythm, first-degree AV block, rate 67, right axis deviation, Q waves in V1-3 03/26/2021: sinus rhythm, first-degree AV block, rate 92, left axis deviation, Q waves II, III, aVF, poor R wave progression  TEE 04/23/2021: 1. Left ventricular ejection fraction, by estimation, is 60 to 65%. The  left ventricle has normal function.   2. Right ventricular systolic function is normal. The right ventricular  size is normal.   3. No left atrial/left atrial appendage thrombus was detected.   4. Systolic anterior motion of the mitral valve leaflets is present . The  mitral valve is normal in structure. No evidence of mitral valve  regurgitation.   5. The aortic valve is normal in structure. Aortic valve regurgitation is  not  visualized.   Echo 03/08/2021:  1. Although overall left ventricular GLS is normal, the distribution of  strain abnormalities is strongly suggestive of cardiac amyloidosis  ("cherry on top" pattern). Left ventricular ejection fraction, by  estimation, is 55 to 60%. The left ventricle has   normal function. The left ventricle has no regional wall motion  abnormalities. There is moderate concentric left ventricular hypertrophy.  Left ventricular diastolic parameters are consistent with Grade I  diastolic dysfunction (impaired relaxation). The  average left ventricular global longitudinal strain is -19.1 %. The global  longitudinal strain is normal.   2. Right ventricular systolic function is normal. The right ventricular  size is normal.   3. The mitral valve is normal in structure. No evidence of mitral valve  regurgitation.   4. The aortic valve is tricuspid. Aortic valve regurgitation is not  visualized. Mild to moderate aortic valve sclerosis/calcification is  present, without any evidence of aortic stenosis.   5. Aortic dilatation noted. There is moderate dilatation of the ascending  aorta, measuring 45 mm.   Comparison(s): No significant change from prior study. Prior images  reviewed side by side. Pericardial effusion has resolved. Note GLS pattern  and marked LVH suggestive of cardiac amyloidosis, but also note normal  Tc32mPYP scan. Consider evaluation for  light chain disease if not yet performed.  Lexiscan Myoview 02/12/2021: Nuclear stress EF: 52%. There was no ST segment deviation noted during stress. This is a low risk study with no evidence of ischemia. The left ventricular ejection fraction is mildly decreased (45-54%). Visually, systolic function appears normal.  Echo 01/30/2021: 1. Large pericardial effusion. The pericardial effusion is anterior to  the right ventricle. There is no evidence of increased pericardial  pressure. Echodensity within the effusion may  suggest exudative nature.   2. Left ventricular ejection fraction, by estimation, is 55 to 60%. The  left ventricle has normal function. Left ventricular endocardial border  not optimally defined to evaluate regional wall motion. There is moderate  concentric left ventricular  hypertrophy. Left ventricular diastolic parameters are indeterminate.   3. Right ventricular systolic function was not well visualized. The right  ventricular size is not well visualized.  4. The mitral valve was not well visualized. No evidence of mitral valve  regurgitation.   5. The aortic valve was not well visualized. Aortic valve regurgitation  is not visualized. No aortic stenosis is present.   6. Aortic dilatation noted. There is mild dilatation of the aortic root,  measuring 40 mm.   7. The inferior vena cava is normal in size with <50% respiratory  variability, suggesting right atrial pressure of 8 mmHg.   Comparison(s): A prior study was performed on 08/29/2019. Prior images  reviewed side by side. Difficult comparsion- this study is much more  technically difficult. Significant increase in pericardial effusion.  Primary cardiology team made aware.  8 day Zio Monitor 08/22/2020: One 4 beat run of NSVT 6 runs of SVT, longest lasting 16 beats Occasional PVCs (3.9%) 8 days of data recorded on Zio monitor. Patient had a min HR of 62 bpm, max HR of 162 bpm, and avg HR of 86 bpm. Predominant underlying rhythm was Sinus Rhythm. No atrial fibrillation, high degree block, or pauses noted. One 4 beat run of NSVT.  6 runs of SVT, longest lasting 16 beats.  Isolated atrial ectopy was rare (<1%).  Isolated ventricular ectopy was occasional (3.9%).  There were 0 triggered events.    Cardiac MRI 10/07/19: 1. Limited study, as only a few sequences were able to be completed due to limitations from presence of deep brain stimulator 2. Basal septal hypertrophy measuring up to 43m (lateral wall 12 mm), consistent with  hypertrophic cardiomyopathy 3. Patchy late gadolinium enhancement in basal septum, consistent with HCM 4. Basal inferolateral midwall LGE, which would not be a typical pattern for HCM, as more commonly seen in setting of prior myocarditis or sarcoidosis. 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  Normal LV size with hyperdynamic systolic function (EF 657% 6.  Normal RV size and systolic function (EF 684%  TTE 08/29/19:  1. Technically difficult study. Left ventricular ejection fraction appears grossly normal, approximately 55-60%, though difficult visualization even with contrast  2. There is asymmetric basal septal hypertrophy measuring 18 mm in basal septum (12 mm posterior wall). Consider cardiac MRI to assess for hypertrophic cardiomyopathy if clinically indicated  3. Definity contrast agent was given IV to delineate the left ventricular endocardial borders.  4. Global right ventricle has normal systolic function.The right ventricular size is normal. No increase in right ventricular wall thickness.  5. There is mild dilatation of the aortic root measuring 39 mm.  6. The inferior vena cava is dilated in size with <50% respiratory variability, suggesting right atrial pressure of 15 mmHg.  PYP scan 10/25/19: The study is normal. No evidence of TTR amyloidosis.   Cardiac monitor 11/11/19: No significant abnormalities No atrial fibrillation. No VT Occasional PVCs (1.3% of beats). 1 patient triggered event, which appears to correspond to short pause (1.1 seconds) from a blocked PA   Predominant rhythm is sinus rhythm. Range is 57 to 137 bpm with average of 89 bpm. No atrial fibrillation, sustained ventricular tachycardia, significant pause, or high degree AV block. Occasional PVCs (1.3% of beats). 1 patient triggered events. Triggered event appears to correspond to short pause (1.1 seconds) from a blocked PAC.  No significant abnormalities.     Recent Labs: 01/16/2022: BUN 23; Creatinine, Ser 1.59; Hemoglobin 13.2; Platelets 229; Potassium 4.0; Sodium 142  Recent Lipid Panel    Component Value Date/Time   CHOL 110 04/23/2021 0717   CHOL 161 10/24/2020 0943  TRIG 172 (H) 04/23/2021 0717   HDL 23 (L) 04/23/2021 0717   HDL 30 (L) 10/24/2020 0943   CHOLHDL 4.8 04/23/2021 0717   VLDL 34 04/23/2021 0717   LDLCALC 53 04/23/2021 0717   LDLCALC 99 10/24/2020 0943    Physical Exam:    VS:  There were no vitals taken for this visit.    Wt Readings from Last 3 Encounters:  04/14/22 262 lb (118.8 kg)  02/17/22 256 lb (116.1 kg)  02/12/22 258 lb 12.8 oz (117.4 kg)     GEN: Well nourished, well developed in no acute distress HEENT: Normal NECK: No JVD CARDIAC: RRR, no murmurs, rubs, gallops RESPIRATORY:  Clear to auscultation without rales, wheezing or rhonchi  ABDOMEN: Soft, non-tender, non-distended MUSCULOSKELETAL:  No edema; No deformity  SKIN: Warm and dry NEUROLOGIC:  Alert and oriented x 3 PSYCHIATRIC:  Normal affect   ASSESSMENT:    No diagnosis found.   PLAN:    Hypertrophic cardiomyopathy: 32m (lateral wall 12 mm) on CMR.  In addition, had unusual scar pattern for HCM, with basal inferolateral scar on MRI.  Fabry's disease can be associated with this scar pattern and asymmetric hypertrophy, so alpha galactosidase was checked and was normal. Scar pattern not c/w amyloid on CMR, but unfortunately unable to do T1 mapping to r/o amyloid due to DBS.  No evidence of amyloid on PYP scan in 2020.  Scar pattern may represent prior episode of myocarditis.  Suspect HCM.  No obstruction.  Recommended screening of first-degree relatives with TTE, he reports that he has discussed with his sons.  Cardiac monitor on 08/22/2020 showed one 4 beat run of NSVT, 6 runs of SVT longest lasting 16 beats, occasional PVCs (4%).  No LVOT obstruction on echo 01/2022 -SPEP/UPEP/light chains do not show evidence of AL amyloid.  PYP scan  negative in 2020, repeated given high clinical concern for amyloid; PYP on 06/07/2021 not suggestive of amyloidosis. -Continue metoprolol, will consolidate to Toprol-XL 50 mg daily -Favor avoiding scheduled diuretics given suspected HCM.  Appears euvolemic.  Continue Lasix as needed.  Asked to monitor daily weights and take if gains more than 3 pounds in 1 day or 5 pounds in 1 week  Pericardial effusion: Large effusion noted on echocardiogram 01/2021.  Started on colchicine for suspected pericarditis.  Repeat echocardiogram 03/12/2021 shows resolution of effusion -Restarted Eliquis given effusion resolved -Completed 321-monthourse of colchicine -Repeat echo 01/2022 continues to show no effusion  Dyspnea: Reports dyspnea with minimal exertion.  Lexiscan Myoview with no evidence of ischemia.  Lexiscan Myoview on 02/12/2021 showed no evidence of ischemia, EF 53%.  Echocardiogram 02/21/2022 showed normal biventricular function, moderate LVH, no significant valvular disease, ascending aortic dilatation measuring 42 mm.  CVA: Subclavian artery thrombosis diagnosed, unclear if cardioembolic source or developed in situ.    No AF on cardiac monitor.  Continue Eliquis 5 mg twice daily and aspirin 81 mg daily added per neurology    Hypertension: On amlodipine 5 mg daily,  losartan 100 mg daily, Toprol-XL 50 mg daily   Hyperlipidemia: On atorvastatin 80 mg daily.  LDL 99 on 10/24/2020.  Calcium score 588 (68th percentile).  Zetia 10 mg daily was added.  LDL 71 on 09/16/21.   Type 2 diabetes: A1c 8.9% on 01/30/2021.  On insulin  PVCs: occasional (1.3%) on monitor.  He is asymptomatic and with normal LV systolic function, no treatment indicated  Leg pain: Normal ABIs 03/10/2022   RTC in 6 months  Medication Adjustments/Labs and Tests Ordered: Current medicines are reviewed at length with the patient today.  Concerns regarding medicines are outlined above.  No orders of the defined types were placed in this  encounter.  No orders of the defined types were placed in this encounter.   There are no Patient Instructions on file for this visit.      Signed, Donato Heinz, MD  07/28/2022 3:39 PM    [place]

## 2022-07-29 ENCOUNTER — Ambulatory Visit: Payer: Medicare PPO | Attending: Cardiology | Admitting: Cardiology

## 2022-07-30 ENCOUNTER — Encounter: Payer: Self-pay | Admitting: Cardiology

## 2022-07-30 ENCOUNTER — Encounter: Payer: Self-pay | Admitting: Nurse Practitioner

## 2022-07-30 ENCOUNTER — Ambulatory Visit: Payer: Medicare PPO | Attending: Nurse Practitioner | Admitting: Nurse Practitioner

## 2022-07-30 ENCOUNTER — Ambulatory Visit
Admission: RE | Admit: 2022-07-30 | Discharge: 2022-07-30 | Disposition: A | Discharge status: Home / Self Care | Payer: Medicare PPO | Source: Ambulatory Visit | Attending: Nurse Practitioner | Admitting: Nurse Practitioner

## 2022-07-30 VITALS — BP 112/62 | HR 76 | Ht 76.0 in | Wt 253.8 lb

## 2022-07-30 DIAGNOSIS — R0609 Other forms of dyspnea: Secondary | ICD-10-CM

## 2022-07-30 DIAGNOSIS — I10 Essential (primary) hypertension: Secondary | ICD-10-CM | POA: Diagnosis not present

## 2022-07-30 DIAGNOSIS — Z8673 Personal history of transient ischemic attack (TIA), and cerebral infarction without residual deficits: Secondary | ICD-10-CM

## 2022-07-30 DIAGNOSIS — R0602 Shortness of breath: Secondary | ICD-10-CM

## 2022-07-30 DIAGNOSIS — I3139 Other pericardial effusion (noninflammatory): Secondary | ICD-10-CM | POA: Diagnosis not present

## 2022-07-30 DIAGNOSIS — E785 Hyperlipidemia, unspecified: Secondary | ICD-10-CM

## 2022-07-30 DIAGNOSIS — I422 Other hypertrophic cardiomyopathy: Secondary | ICD-10-CM | POA: Diagnosis not present

## 2022-07-30 DIAGNOSIS — E1169 Type 2 diabetes mellitus with other specified complication: Secondary | ICD-10-CM | POA: Diagnosis not present

## 2022-07-30 DIAGNOSIS — I493 Ventricular premature depolarization: Secondary | ICD-10-CM | POA: Diagnosis not present

## 2022-07-30 DIAGNOSIS — E669 Obesity, unspecified: Secondary | ICD-10-CM

## 2022-07-30 NOTE — Telephone Encounter (Signed)
Error

## 2022-07-30 NOTE — Patient Instructions (Signed)
Medication Instructions:  Your physician recommends that you continue on your current medications as directed. Please refer to the Current Medication list given to you today.  *If you need a refill on your cardiac medications before your next appointment, please call your pharmacy*   Lab Work: CBC, CMET, BNP  If you have labs (blood work) drawn today and your tests are completely normal, you will receive your results only by: [place] (if you have MyChart) OR A paper copy in the mail If you have any lab test that is abnormal or we need to change your treatment, we will call you to review the results.   Testing/Procedures: Your physician has requested that you have an echocardiogram. Echocardiography is a painless test that uses sound waves to create images of your heart. It provides your doctor with information about the size and shape of your heart and how well your heart's chambers and valves are working. This procedure takes approximately one hour. There are no restrictions for this procedure.   A chest x-ray takes a picture of the organs and structures inside the chest, including the heart, lungs, and blood vessels. This test can show several things, including, whether the heart is enlarges; whether fluid is building up in the lungs; and whether pacemaker / defibrillator leads are still in place.    Follow-Up: At [facility], you and your health needs are our priority.  As part of our continuing mission to provide you with exceptional heart care, we have created designated Provider Care Teams.  These Care Teams include your primary Cardiologist (physician) and Advanced Practice Providers (APPs -  Physician Assistants and Nurse Practitioners) who all work together to provide you with the care you need, when you need it.  We recommend signing up for the patient portal called "MyChart".  Sign up information is provided on this After Visit Summary.  MyChart is used to connect  with patients for Virtual Visits (Telemedicine).  Patients are able to view lab/test results, encounter notes, upcoming appointments, etc.  Non-urgent messages can be sent to your provider as well.   To learn more about what you can do with MyChart, go to [email or web address].    Your next appointment:   1 month(s)  The format for your next appointment:   In Person  Provider:   Donato Heinz, MD     Other Instructions It is okay to message Hildred Alamin to see where it would be a good fit to add the patient to Dr. Linda Hedges schedule.   Important Information About Sugar

## 2022-07-30 NOTE — Progress Notes (Signed)
Office Visit    Patient Name: Joshua [name] Date of Encounter: 07/30/2022  Primary Care Provider:  [name], MD Primary Cardiologist:  Donato Heinz, MD  Chief Complaint    71 year old male with a history of HCM, hypertension, CVA, PE after knee surgery, type 2 diabetes, essential tremor s/p DBS and OSA on CPAP who presents for follow-up related to shortness of breath and lower extremity edema.  Past Medical History    Past Medical History:  Diagnosis Date   Benign essential tremor    Benign positional vertigo    CVA (cerebral vascular accident) ([place])    x2 - L retina, 1 right parietal   Degenerative arthritis    Depression    Diabetes mellitus    DVT (deep venous thrombosis) ([place]) 2018   Dyslipidemia    GERD (gastroesophageal reflux disease)    hiatal hernia   Gout    H/O: vasectomy    Hearing aid worn    b/l   Hx of appendectomy    Hx of tonsillectomy    Hypertension    Hypertrophic cardiomyopathy ([place])    Ischemic optic neuropathy    on the left   Melanoma ([place])    NSVT (nonsustained ventricular tachycardia) ([place])    1 4 beat run on event monitor in 07/2020   Obesity    OSA on CPAP    setting = 5   Pulmonary emboli ([place]) 2018   PVC's (premature ventricular contractions)    SVT (supraventricular tachycardia) ([facility])    by event monitor   Tremor, essential 06/22/2017   Wears glasses    Past Surgical History:  Procedure Laterality Date   APPENDECTOMY     arthroscopic knee surgery Bilateral    CATARACT EXTRACTION Bilateral    COLONOSCOPY     MINOR PLACEMENT OF FIDUCIAL N/A 06/30/2019   Procedure: Fiducial placement;  Surgeon: Erline Levine, MD;  Location: [place];  Service: Neurosurgery;  Laterality: N/A;  Fiducial placement   NASAL SEPTUM SURGERY     PULSE GENERATOR IMPLANT N/A 07/14/2019   Procedure: Left cranial Implanted Pulse Generator and lead extension placement to right chest ;  Surgeon: Erline Levine, MD;  Location: [place];  Service:  Neurosurgery;  Laterality: N/A;   SUBTHALAMIC STIMULATOR INSERTION Left 07/07/2019   Procedure: LEFT DEEP BRAIN STIMULATOR PLACEMENT;  Surgeon: Erline Levine, MD;  Location: [place];  Service: Neurosurgery;  Laterality: Left;   TEE WITHOUT CARDIOVERSION N/A 04/23/2021   Procedure: TRANSESOPHAGEAL ECHOCARDIOGRAM (TEE);  Surgeon: Acie Fredrickson Wonda Cheng, MD;  Location: [facility] END[facility];  Service: Cardiovascular;  Laterality: N/A;   TONSILLECTOMY     TOTAL KNEE ARTHROPLASTY Left 03/30/2017   Procedure: LEFT TOTAL KNEE ARTHROPLASTY;  Surgeon: Paralee Cancel, MD;  Location: [facility];  Service: Orthopedics;  Laterality: Left;   WISDOM TOOTH EXTRACTION      Allergies  Allergies  Allergen Reactions   Melatonin Other (See Comments)    dizzy    History of Present Illness    71 year old male with the above past medical history including HCM, PVCs, hypertension, CVA, PE after knee surgery, type 2 diabetes, essential tremor s/p DBS and OSA on CPAP.   He was hospitalized in October 2020 in the setting of acute CVA.  CTA of the head and neck showed subclavian artery thrombus.  MRI of the brain showed right internal capsule infarct.  Echocardiogram showed no cardiogenic source of embolism.  He was started on Eliquis.  Cardiac monitor showed no evidence of atrial  fibrillation.  Cardiogram was notable for an incidental finding of asymmetric basal septal hypertrophy.  He was referred to cardiology.  Cardiac MRI showed basal septal hypertrophy measuring up to 20 mm (lateral wall 12 mm), consistent with hypertrophic cardiomyopathy.  PYP scan showed no evidence of amyloid.  Cardiac monitor showed no VT, occasional PVCs.  Repeat monitor in 07/2020 showed one 4 beat run of NSVT, 6 runs of SVT, longest lasting 60 beats, occasional PVCs.  His only syncopal episode occurred with his CVA years ago.  He was hospitalized in March 2022 with shortness of breath in the setting of pericardial effusion.  Follow-up echocardiogram in 02/2021 showed  resolution of pericardial effusion, EF 55 to 60%, G1 DD, moderate LVH, strain abnormalities suggestive of cardiac amyloidosis.  Lexiscan Myoview in 01/2021 showed no evidence of ischemia, EF 53%.  Repeat PYP in 05/2021 showed no evidence of amyloidosis, SPEP/UPEP/light chains without evidence of AL amyloid.  He was last seen in the office on 02/12/2022 and was stable from a cardiac standpoint.  He did note ongoing dyspnea with minimal exertion.  Repeat echocardiogram showed EF 60 to 65%, normal LV function, no RWMA, moderate concentric LVH, indeterminate diastolic parameters, mild dilation of ascending aorta, 42 mm, no significant valvular abnormalities, no evidence of pericardial effusion. Additionally, he reported leg pain with walking.  ABIs were normal.  He presents today for follow-up.  Since his last visit he notes a 3-week history of worsening dyspnea on exertion.  He missed his appointment with Dr. Gardiner Rhyme yesterday because he felt so poorly. He feels short of breath with minimal exertion.  He denies any chest pain, weight gain, worsening edema, PND, orthopnea.  He reports having little energy and states he feels "terrible."  He denies any recent long distance travel, reports adherence to Eliquis, denies bleeding.  Other than his progressive dyspnea on exertion, he denies any additional concerns today.  Home Medications    Current Outpatient Medications  Medication Sig Dispense Refill   amLODipine (NORVASC) 10 MG tablet TAKE ONE TABLET AT BEDTIME. 90 tablet 3   apixaban (ELIQUIS) 5 MG TABS tablet Take 1 tablet (5 mg total) by mouth 2 (two) times daily. 180 tablet 2   aspirin 81 MG EC tablet 1 tablet     atorvastatin (LIPITOR) 80 MG tablet Take 1 tablet (80 mg total) by mouth daily at 6 PM. (Patient taking differently: Take 40 mg by mouth daily at 6 PM.) 30 tablet 2   clindamycin (CLEOCIN) 150 MG capsule Take 1 capsule (150 mg total) by mouth every 6 (six) hours. 28 capsule 0   colchicine 0.6 MG  tablet colchicine 0.6 mg tablet  TAKE ONE TABLET BY MOUTH TWICE DAILY.     Continuous Blood Gluc Sensor (FREESTYLE LIBRE 2 SENSOR) MISC See admin instructions.     cycloSPORINE (RESTASIS) 0.05 % ophthalmic emulsion Apply to eye.     DULoxetine (CYMBALTA) 60 MG capsule Take 60 mg by mouth daily.     escitalopram (LEXAPRO) 20 MG tablet Take 20 mg by mouth daily.     ezetimibe (ZETIA) 10 MG tablet TAKE ONE TABLET BY MOUTH ONCE DAILY. 90 tablet 3   fenofibrate 54 MG tablet Take 54 mg by mouth daily.     furosemide (LASIX) 40 MG tablet Take 1 tablet (40 mg total) by mouth as needed (Weight increase of 3 pounds in 1 day or 5 pounds in 1 week.). 30 tablet 3   Insulin Glargine, 1 Unit Dial, (TOUJEO SOLOSTAR) 300  UNIT/ML SOPN Inject 80 Units into the skin 2 (two) times daily.     insulin lispro (HUMALOG) 100 UNIT/ML KwikPen Inject 15-25 Units into the skin See admin instructions. Inject 15 units subcutaneously after breakfast and 25 units after lunch and supper     losartan (COZAAR) 100 MG tablet Take 1 tablet by mouth daily.     Lutein-Zeaxanthin 25-5 MG CAPS Take 1 tablet by mouth daily.     metFORMIN (GLUCOPHAGE) 500 MG tablet Take 500 mg by mouth daily with breakfast.     metFORMIN (GLUCOPHAGE) 500 MG tablet 1 tablet with a meal     metoprolol succinate (TOPROL-XL) 50 MG 24 hr tablet Take 1 tablet (50 mg total) by mouth daily. Take with or immediately following a meal. 90 tablet 3   mirtazapine (REMERON) 7.5 MG tablet Take 7.5 mg by mouth at bedtime.     Multiple Vitamin (MULTIVITAMIN WITH MINERALS) TABS tablet Take 1 tablet by mouth daily.     Omega-3 Fatty Acids (FISH OIL) 1000 MG CAPS Take 2,000 mg by mouth daily. Reported on 02/13/2016     ONETOUCH VERIO test strip      Polyethyl Glycol-Propyl Glycol (SYSTANE OP) Place 1-2 drops into both eyes 4 (four) times daily as needed (dry eyes).      PRESCRIPTION MEDICATION Inhale into the lungs at bedtime. CPAP     RESTASIS 0.05 % ophthalmic emulsion  Place 1 drop into both eyes 2 (two) times daily as needed (dry eyes).     SURE COMFORT PEN NEEDLES 31G X 5 MM MISC      No current facility-administered medications for this visit.     Review of Systems    He denies chest pain, palpitations, pnd, orthopnea, n, v, dizziness, syncope, edema, weight gain, or early satiety. All other systems reviewed and are otherwise negative except as noted above.   Physical Exam    VS:  BP 112/62   Pulse 76   Ht '6\' 4"'$  (1.93 m)   Wt 253 lb 12.8 oz (115.1 kg)   SpO2 99%   BMI 30.89 kg/m   GEN: Well nourished, well developed, in no acute distress. HEENT: normal. Neck: Supple, no JVD, carotid bruits, or masses. Cardiac: RRR, no murmurs, rubs, or gallops. No clubbing, cyanosis, non-pitting bilateral ankle edema.  Radials/DP/PT 2+ and equal bilaterally.  Respiratory:  Respirations regular and unlabored, clear to auscultation bilaterally. GI: Soft, nontender, nondistended, BS + x 4. MS: no deformity or atrophy. Skin: warm and dry, no rash. Neuro:  Strength and sensation are intact. Psych: Normal affect.  Accessory Clinical Findings    ECG personally reviewed by me today -sinus rhythm, 76 bpm, sinus arrhythmia, first-degree AV block, LAD- no acute changes.   Lab Results  Component Value Date   WBC 9.9 01/16/2022   HGB 13.2 01/16/2022   HCT 40.0 01/16/2022   MCV 88.5 01/16/2022   PLT 229 01/16/2022   Lab Results  Component Value Date   CREATININE 1.59 (H) 01/16/2022   BUN 23 01/16/2022   NA 142 01/16/2022   K 4.0 01/16/2022   CL 106 01/16/2022   CO2 27 01/16/2022   Lab Results  Component Value Date   ALT 29 04/22/2021   AST 21 04/22/2021   ALKPHOS 77 04/22/2021   BILITOT 0.6 04/22/2021   Lab Results  Component Value Date   CHOL 110 04/23/2021   HDL 23 (L) 04/23/2021   LDLCALC 53 04/23/2021   TRIG 172 (H) 04/23/2021  CHOLHDL 4.8 04/23/2021    Lab Results  Component Value Date   HGBA1C 7.9 (H) 04/21/2021    Assessment &  Plan    1. Dyspnea on exertion: He notes a 3-week history of progressive dyspnea on exertion.  He is now short of breath with minimal activity and has very little energy to do things.  He has stable chronic bilateral ankle edema.  He denies chest pain, weight gain, worsening edema, PND, orthopnea.  He has not taken any Lasix as his weight has actually decreased recently. Echo in 01/2022 showed EF 60 to 65%, normal LV function, no RWMA, moderate concentric LVH, indeterminate diastolic parameters, mild dilation of ascending aorta, 42 mm, no significant valvular abnormalities, no evidence of pericardial effusion.  Discussed with Dr. Gardiner Rhyme, primary cardiologist, who recommends labs including CBC, CMET, BNP, chest x-ray.  Given history of pericardial effusion, will repeat limited echo.  Coronary calcium score was 588 in 01/2021, 68th percentile, Lexiscan in 01/2021 was negative for ischemia. If work-up unrevealing, consider possible future ischemic evaluation. Low suspicion for PE given adherence to Eliquis and no recent long distance travel.  Follow-up pending test results.  2. Hypertrophic cardiomyopathy: Cardiac MRI showed basal septal hypertrophy measuring up to 20 mm (lateral wall 12 mm).  Scar pattern was unusual for HCM, alpha gal lack ptosis the days was checked to rule out Fabry's disease, this was normal.  Unable to do T1 mapping to rule out amyloid on CMR due to DBS.  However, no evidence of amyloid on PYP scan in 2020 or in 2022.  SPEP/UPEP/light chains did not show evidence of AL amyloid.  Cardiac monitor in 2021 showed one 4 beat run of NSVT, 6 runs of SVT longest lasting 16 beats, occasional PVCs (4%).  Continue metoprolol, prn Lasix.   3. Pericardial effusion: Resolved on most recent echo in 01/2022.  However, given worsening dyspnea with minimal exertion, will repeat limited echo to rule out recurrent pericardial effusion.  4. PVCs: He denies any recent palpitations.  Continue metoprolol.   5.  History of CVA: Subclavian artery thrombosis was diagnosed at the time of his CVA in 2020, unclear if cardioembolic source or developed in situ.  No A-fib on outpatient monitor.  Follows with neurology.  Continue Eliquis and aspirin.  6. Hypertension: BP well controlled. Continue current antihypertensive regimen.   7. Hyperlipidemia: LDL was 71 in 08/2021.  Continue aspirin, Lipitor, fibrate, Zetia.  8. Type 2 diabetes: A1c was 7.1 in 04/2022.  Monitored and managed per PCP.  9. Disposition: Follow-up in 1 month with Dr. Gardiner Rhyme.      Lenna Sciara, NP 07/30/2022, 5:02 PM

## 2022-07-31 LAB — CBC
Hematocrit: 39 % (ref 37.5–51.0)
Hemoglobin: 13 g/dL (ref 13.0–17.7)
MCH: 30.1 pg (ref 26.6–33.0)
MCHC: 33.3 g/dL (ref 31.5–35.7)
MCV: 90 fL (ref 79–97)
Platelets: 267 10*3/uL (ref 150–450)
RBC: 4.32 x10E6/uL (ref 4.14–5.80)
RDW: 14 % (ref 11.6–15.4)
WBC: 9.1 10*3/uL (ref 3.4–10.8)

## 2022-07-31 LAB — COMPREHENSIVE METABOLIC PANEL
ALT: 30 IU/L (ref 0–44)
AST: 19 IU/L (ref 0–40)
Albumin/Globulin Ratio: 1.6 (ref 1.2–2.2)
Albumin: 4 g/dL (ref 3.8–4.8)
Alkaline Phosphatase: 103 IU/L (ref 44–121)
BUN/Creatinine Ratio: 13 (ref 10–24)
BUN: 23 mg/dL (ref 8–27)
Bilirubin Total: 0.3 mg/dL (ref 0.0–1.2)
CO2: 24 mmol/L (ref 20–29)
Calcium: 9.4 mg/dL (ref 8.6–10.2)
Chloride: 104 mmol/L (ref 96–106)
Creatinine, Ser: 1.71 mg/dL — ABNORMAL HIGH (ref 0.76–1.27)
Globulin, Total: 2.5 g/dL (ref 1.5–4.5)
Glucose: 92 mg/dL (ref 70–99)
Potassium: 4.8 mmol/L (ref 3.5–5.2)
Sodium: 143 mmol/L (ref 134–144)
Total Protein: 6.5 g/dL (ref 6.0–8.5)
eGFR: 42 mL/min/{1.73_m2} — ABNORMAL LOW (ref >59)

## 2022-07-31 LAB — BRAIN NATRIURETIC PEPTIDE: BNP: 19.6 pg/mL (ref 0.0–100.0)

## 2022-08-03 DIAGNOSIS — E1121 Type 2 diabetes mellitus with diabetic nephropathy: Secondary | ICD-10-CM | POA: Diagnosis not present

## 2022-08-04 ENCOUNTER — Telehealth: Payer: Self-pay

## 2022-08-04 NOTE — Telephone Encounter (Signed)
Spoke with pt. Pt was notified of lab results. Pt will continue his current medication and f/u as planned.  

## 2022-08-14 ENCOUNTER — Ambulatory Visit (HOSPITAL_COMMUNITY): Payer: Medicare PPO | Attending: Nurse Practitioner

## 2022-08-14 ENCOUNTER — Telehealth: Payer: Self-pay

## 2022-08-14 DIAGNOSIS — I3139 Other pericardial effusion (noninflammatory): Secondary | ICD-10-CM | POA: Diagnosis not present

## 2022-08-14 DIAGNOSIS — I422 Other hypertrophic cardiomyopathy: Secondary | ICD-10-CM | POA: Diagnosis not present

## 2022-08-14 DIAGNOSIS — R0602 Shortness of breath: Secondary | ICD-10-CM | POA: Diagnosis not present

## 2022-08-14 DIAGNOSIS — R0609 Other forms of dyspnea: Secondary | ICD-10-CM | POA: Diagnosis not present

## 2022-08-14 LAB — ECHOCARDIOGRAM LIMITED
AR max vel: 3.8 cm2
AV Area VTI: 4.13 cm2
AV Area mean vel: 3.92 cm2
AV Mean grad: 5 mmHg
AV Peak grad: 8.9 mmHg
Ao pk vel: 1.5 m/s
Area-P 1/2: 2.85 cm2
S' Lateral: 3 cm

## 2022-08-14 MED ORDER — PERFLUTREN LIPID MICROSPHERE
1.0000 mL | INTRAVENOUS | Status: AC | PRN
  Administered 2022-08-14: 2 mL via INTRAVENOUS

## 2022-08-14 NOTE — Telephone Encounter (Signed)
Spoke with pt. Pt was notified of xray results and will follow up as planned.

## 2022-08-15 ENCOUNTER — Telehealth: Payer: Self-pay

## 2022-08-15 NOTE — Telephone Encounter (Signed)
Spoke with pt. Pt was notified of echo results. Pt will continue his current medications and follow up as planned.

## 2022-08-21 ENCOUNTER — Ambulatory Visit (INDEPENDENT_AMBULATORY_CARE_PROVIDER_SITE_OTHER): Payer: Medicare PPO | Admitting: Podiatry

## 2022-08-21 DIAGNOSIS — L97529 Non-pressure chronic ulcer of other part of left foot with unspecified severity: Secondary | ICD-10-CM

## 2022-08-21 DIAGNOSIS — B351 Tinea unguium: Secondary | ICD-10-CM | POA: Diagnosis not present

## 2022-08-21 DIAGNOSIS — M79674 Pain in right toe(s): Secondary | ICD-10-CM | POA: Diagnosis not present

## 2022-08-21 DIAGNOSIS — M79675 Pain in left toe(s): Secondary | ICD-10-CM

## 2022-08-21 DIAGNOSIS — E11621 Type 2 diabetes mellitus with foot ulcer: Secondary | ICD-10-CM | POA: Diagnosis not present

## 2022-08-21 DIAGNOSIS — L97522 Non-pressure chronic ulcer of other part of left foot with fat layer exposed: Secondary | ICD-10-CM

## 2022-08-21 MED ORDER — SILVER SULFADIAZINE 1 % EX CREA: 1.0000 | TOPICAL_CREAM | Freq: Every day | CUTANEOUS | 0 refills | Status: DC

## 2022-08-21 MED ORDER — DOXYCYCLINE HYCLATE 100 MG PO TABS: 100.0000 mg | ORAL_TABLET | Freq: Two times a day (BID) | ORAL | 0 refills | Status: DC

## 2022-08-22 NOTE — Progress Notes (Signed)
Subjective: Chief Complaint  Patient presents with   Nail Problem    DFC    71 year old male presents the office today for diabetic foot evaluation and for elongated nails that he cannot trim himself as well as calluses.  He also has a new spot on the ball of his left foot.  Denies any drainage or pus.  Not sure how this started.  He has no other concerns today.  [name], MD  Objective: AAO x3, NAD DP/PT pulses palpable bilaterally, CRT less than 3 seconds Sensation decreased with Semmes Weinstein monofilament  Nails are hypertrophic, dystrophic, brittle, discolored, elongated 10. No surrounding redness or drainage. Tenderness nails 1-5 bilaterally.  On the left foot submetatarsal 1 is a hyperkeratotic lesion with dried blood and not able to measure the wound prior to debridement.  After debridement the wound was present measuring 0.7 x 0.2 x 0.2 cm without any probing, undermining or tunneling.  No surrounding erythema, ascending cellulitis.  No fluctuance or crepitation.  There is mild malodor noted. No pain with calf compression, swelling, warmth, erythema     Assessment: 71 year old male symptomatic onychomycosis, ulceration left foot  Plan: -All treatment options discussed with the patient including all alternatives, risks, complications.  -Sharply debrided the toenails x10 without any complications or bleeding -Sharply debrided the wound on the left foot with a #[id number] with scalpel to any complications and healthy, bleeding, granular tissue.  Not able to measure the wound prior to debridement and post wound debridement measurements are above.  Cleaned with saline.  Silvadene was applied followed by dressing.  Continue daily dressing changes.  I prescribed for him doxycycline as well as Silvadene. -Modified his insert for further offloading. -Monitor for any clinical signs or symptoms of infection and directed to call the office immediately should any occur or go to the  ER. -Glucose control  -Patient encouraged to call the office with any questions, concerns, change in symptoms.    Trula Slade DPM

## 2022-09-02 ENCOUNTER — Ambulatory Visit: Payer: Medicare PPO | Admitting: Podiatry

## 2022-09-02 DIAGNOSIS — M9902 Segmental and somatic dysfunction of thoracic region: Secondary | ICD-10-CM | POA: Diagnosis not present

## 2022-09-02 DIAGNOSIS — M9905 Segmental and somatic dysfunction of pelvic region: Secondary | ICD-10-CM | POA: Diagnosis not present

## 2022-09-02 DIAGNOSIS — M25552 Pain in left hip: Secondary | ICD-10-CM | POA: Diagnosis not present

## 2022-09-02 DIAGNOSIS — E1121 Type 2 diabetes mellitus with diabetic nephropathy: Secondary | ICD-10-CM | POA: Diagnosis not present

## 2022-09-02 DIAGNOSIS — M5384 Other specified dorsopathies, thoracic region: Secondary | ICD-10-CM | POA: Diagnosis not present

## 2022-09-04 DIAGNOSIS — M25552 Pain in left hip: Secondary | ICD-10-CM | POA: Diagnosis not present

## 2022-09-04 DIAGNOSIS — M5384 Other specified dorsopathies, thoracic region: Secondary | ICD-10-CM | POA: Diagnosis not present

## 2022-09-04 DIAGNOSIS — M9902 Segmental and somatic dysfunction of thoracic region: Secondary | ICD-10-CM | POA: Diagnosis not present

## 2022-09-04 DIAGNOSIS — M9905 Segmental and somatic dysfunction of pelvic region: Secondary | ICD-10-CM | POA: Diagnosis not present

## 2022-09-08 DIAGNOSIS — M9905 Segmental and somatic dysfunction of pelvic region: Secondary | ICD-10-CM | POA: Diagnosis not present

## 2022-09-08 DIAGNOSIS — M25552 Pain in left hip: Secondary | ICD-10-CM | POA: Diagnosis not present

## 2022-09-08 DIAGNOSIS — M5384 Other specified dorsopathies, thoracic region: Secondary | ICD-10-CM | POA: Diagnosis not present

## 2022-09-08 DIAGNOSIS — M9902 Segmental and somatic dysfunction of thoracic region: Secondary | ICD-10-CM | POA: Diagnosis not present

## 2022-09-11 DIAGNOSIS — M9905 Segmental and somatic dysfunction of pelvic region: Secondary | ICD-10-CM | POA: Diagnosis not present

## 2022-09-11 DIAGNOSIS — M5384 Other specified dorsopathies, thoracic region: Secondary | ICD-10-CM | POA: Diagnosis not present

## 2022-09-11 DIAGNOSIS — M9902 Segmental and somatic dysfunction of thoracic region: Secondary | ICD-10-CM | POA: Diagnosis not present

## 2022-09-11 DIAGNOSIS — M25552 Pain in left hip: Secondary | ICD-10-CM | POA: Diagnosis not present

## 2022-09-16 ENCOUNTER — Ambulatory Visit: Payer: Medicare PPO | Attending: Cardiology | Admitting: Cardiology

## 2022-09-16 DIAGNOSIS — M5384 Other specified dorsopathies, thoracic region: Secondary | ICD-10-CM | POA: Diagnosis not present

## 2022-09-16 DIAGNOSIS — M25552 Pain in left hip: Secondary | ICD-10-CM | POA: Diagnosis not present

## 2022-09-16 DIAGNOSIS — M9902 Segmental and somatic dysfunction of thoracic region: Secondary | ICD-10-CM | POA: Diagnosis not present

## 2022-09-16 DIAGNOSIS — M9905 Segmental and somatic dysfunction of pelvic region: Secondary | ICD-10-CM | POA: Diagnosis not present

## 2022-09-16 NOTE — Progress Notes (Deleted)
Cardiology Office Note:    Date:  09/16/2022   ID:  [name], DOB 1951-10-15, MRN [id number]  PCP:  [name], MD  Cardiologist:  Donato Heinz, MD  Electrophysiologist:  None   Referring MD: [name], MD   No chief complaint on file.   History of Present Illness:     Joshua [name] is a 71 y.o. male with a hx of HCM, hypertension, diabetes, OSA, PE after knee surgery, essential tremor status post DBS June 2020, CVA who presents for follow-up.  Admitted to [facility] from 08/28/19 through 08/31/19 with an acute CVA.  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  MRI brain showed right internal capsule infarct.  Echocardiogram showed no cardiogenic source of embolism.  He was started on heparin given subclavian artery thrombosis, and this was transitioned to Eliquis.  It was unclear if the thrombosis was due to a cardiac source or developed in situ.  Stroke team recommended Eliquis for 2 months and repeating CTA neck; if CTA neck negative and no atrial fibrillation noted on 30-day monitor, stroke team recommended stopping Eliquis and resuming antiplatelet agent.  TTE was notable for an incidental finding of asymmetric basal septal hypertrophy.  He was referred to cardiology and seen on 09/19/19.  Cardiac MRI was ordered, which showed Basal septal hypertrophy measuring up to 99m (lateral wall 176m, consistent with hypertrophic cardiomyopathy.  PYP scan showed no evidence of amyloid.  Cardiac monitor showed no VT, occasional PVCs (1.3% of beats).   Repeat monitor on 08/22/2020 showed 1 4 beat run of NSVT, 6 runs of SVT longest lasting 16 beats, occasional PVCs (4%).  He denies any family history of HCM.  Does have vertigo.  No syncope except with CVA years ago.  [facility] with shortness of breath and elevated effort for cardiac fusion.  Started on colchicine and Eliquis was held.  No adequate window  for pericardiocentesis.  Repeat echo 03/08/2021 showed pericardial effusion had resolved, LVEF 55 to 6040%grade 1 diastolic dysfunction, moderate LVH, strain abnormalities suggestive of cardiac amyloidosis.  Lexiscan Myoview on 02/12/2021 showed no evidence of ischemia, EF 53%.  Echocardiogram 08/14/2022 showed EF 55%, moderate LVH, grade 1 diastolic dysfunction, normal RV function, no significant valvular disease, dilated ascending aorta measuring 43 mm.  Since last clinic visit,  he reports that he has been doing okay.  Has been having dyspnea with minimal exertion.  Denies any chest pain.  Denies any lightheadedness, syncope, lower extremity edema, or palpitations.   Wt Readings from Last 3 Encounters:  07/30/22 253 lb 12.8 oz (115.1 kg)  04/14/22 262 lb (118.8 kg)  02/17/22 256 lb (116.1 kg)    Past Medical History:  Diagnosis Date   Benign essential tremor    Benign positional vertigo    CV[place]cere[place] vascular [place]) ([facility]   x2 - L retina, 1 right parietal   Degenerative arthritis    Depression    Diabetes mellitus    DV[place]) [place]   Dyslipidemia    GERD (gastroesophageal reflux disease)    hiatal hernia   Gout    H/O: vasectomy    Hearing aid worn    b/l   Hx of appendectomy    Hx of tonsillectomy    Hypertension    Hypertrophic cardiomyopathy ([facility]   Ischemic optic neuropathy    on the left  Melanoma ([place])    NSVT (nonsustained ventricular tachycardia) ([place])    1 4 beat run on event monitor in 07/2020   Obesity    OSA on CPAP    setting = 5   Pulmonary emboli ([place]) 2018   PVC's (premature ventricular contractions)    SVT (supraventricular tachycardia) ([place])    by event monitor   Tremor, essential 06/22/2017   Wears glasses     Past Surgical History:  Procedure Laterality Date   APPENDECTOMY     arthroscopic knee surgery Bilateral    CATARACT EXTRACTION Bilateral    COLONOSCOPY     MINOR PLACEMENT OF FIDUCIAL N/A 06/30/2019    Procedure: Fiducial placement;  Surgeon: Erline Levine, MD;  Location: [place];  Service: Neurosurgery;  Laterality: N/A;  Fiducial placement   NASAL SEPTUM SURGERY     PULSE GENERATOR IMPLANT N/A 07/14/2019   Procedure: Left cranial Implanted Pulse Generator and lead extension placement to right chest ;  Surgeon: Erline Levine, MD;  Location: [place];  Service: Neurosurgery;  Laterality: N/A;   SUBTHALAMIC STIMULATOR INSERTION Left 07/07/2019   Procedure: LEFT DEEP BRAIN STIMULATOR PLACEMENT;  Surgeon: Erline Levine, MD;  Location: [place];  Service: Neurosurgery;  Laterality: Left;   TEE WITHOUT CARDIOVERSION N/A 04/23/2021   Procedure: TRANSESOPHAGEAL ECHOCARDIOGRAM (TEE);  Surgeon: Acie Fredrickson Wonda Cheng, MD;  Location[facility]COPY;  Service: Cardiovascular;  Laterality: N/A;   TONSILLECTOMY     TOTAL KNEE ARTHROPLASTY Left 03/30/2017   Procedure: LEFT TOTAL KNEE ARTHROPLASTY;  Surgeon: Paralee Cancel, MD;  Location[facility];  Service: Orthopedics;  Laterality: Left;   WISDOM TOOTH EXTRACTION      Current Medications: No outpatient medications have been marked as taking for the 09/16/22 encounter (Appointment) with Donato Heinz, MD.     Allergies:   Melatonin   Social History   Socioeconomic History   Marital status: Married    Spouse name: [name]   Number of children: 2   Years of education: Bachelors    Highest education level: Bachelor's degree (e.g., BA, AB, BS)  Occupational History   Occupation: retired    Fish farm manager: [employer]    Comment: teaching/coaching  Tobacco Use   Smoking status: Former    Packs/day: 1.00    Years: 10.00    Total pack years: 10.00    Types: Cigarettes    Quit date: 11/25/1983    Years since quitting: 38.8   Smokeless tobacco: Never  Vaping Use   Vaping Use: Never used  Substance and Sexual Activity   Alcohol use: Yes    Alcohol/week: 2.0 standard drinks of alcohol    Types: 2 Standard drinks or equivalent per week    Comment:  occassionally   Drug use: No   Sexual activity: Not on file  Other Topics Concern   Not on file  Social History Narrative   Patient lives at home with wife. [name](   Patient has 2 children that are in good health.    Patient works for [employer]. Retired .   Patient has a Bachelors degree in History.       Right handed    Lives in one story home - Handicap accessible    Social Determinants of Health   Financial Resource Strain: Not on file  Food Insecurity: Not on file  Transportation Needs: Not on file  Physical Activity: Not on file  Stress: Not on file  Social Connections: Not on file  Family History: The patient's family history includes Alzheimer's disease in his father; Cerebral aneurysm in his mother; Diabetes in his maternal grandmother; Healthy in his son; Tremor in his brother, maternal uncle, and mother. There is no history of Colon cancer.  ROS:   Please see the history of present illness.     All other systems reviewed and are negative.  EKGs/Labs/Other Studies Reviewed:    The following studies were reviewed today:   EKG:   02/12/22: Sinus rhythm, first-degree AV block, rate 72, left axis deviation, poor R wave progression, Q waves in inferior leads 04/30/2021: sinus rhythm, first-degree AV block, rate 67, right axis deviation, Q waves in V1-3 03/26/2021: sinus rhythm, first-degree AV block, rate 92, left axis deviation, Q waves II, III, aVF, poor R wave progression  TEE 04/23/2021: 1. Left ventricular ejection fraction, by estimation, is 60 to 65%. The  left ventricle has normal function.   2. Right ventricular systolic function is normal. The right ventricular  size is normal.   3. No left atrial/left atrial appendage thrombus was detected.   4. Systolic anterior motion of the mitral valve leaflets is present . The  mitral valve is normal in structure. No evidence of mitral valve  regurgitation.   5. The aortic valve is normal in structure.  Aortic valve regurgitation is  not visualized.   Echo 03/08/2021:  1. Although overall left ventricular GLS is normal, the distribution of  strain abnormalities is strongly suggestive of cardiac amyloidosis  ("cherry on top" pattern). Left ventricular ejection fraction, by  estimation, is 55 to 60%. The left ventricle has   normal function. The left ventricle has no regional wall motion  abnormalities. There is moderate concentric left ventricular hypertrophy.  Left ventricular diastolic parameters are consistent with Grade I  diastolic dysfunction (impaired relaxation). The  average left ventricular global longitudinal strain is -19.1 %. The global  longitudinal strain is normal.   2. Right ventricular systolic function is normal. The right ventricular  size is normal.   3. The mitral valve is normal in structure. No evidence of mitral valve  regurgitation.   4. The aortic valve is tricuspid. Aortic valve regurgitation is not  visualized. Mild to moderate aortic valve sclerosis/calcification is  present, without any evidence of aortic stenosis.   5. Aortic dilatation noted. There is moderate dilatation of the ascending  aorta, measuring 45 mm.   Comparison(s): No significant change from prior study. Prior images  reviewed side by side. Pericardial effusion has resolved. Note GLS pattern  and marked LVH suggestive of cardiac amyloidosis, but also note normal  Tc36mPYP scan. Consider evaluation for  light chain disease if not yet performed.  Lexiscan Myoview 02/12/2021: Nuclear stress EF: 52%. There was no ST segment deviation noted during stress. This is a low risk study with no evidence of ischemia. The left ventricular ejection fraction is mildly decreased (45-54%). Visually, systolic function appears normal.  Echo 01/30/2021: 1. Large pericardial effusion. The pericardial effusion is anterior to  the right ventricle. There is no evidence of increased pericardial  pressure.  Echodensity within the effusion may suggest exudative nature.   2. Left ventricular ejection fraction, by estimation, is 55 to 60%. The  left ventricle has normal function. Left ventricular endocardial border  not optimally defined to evaluate regional wall motion. There is moderate  concentric left ventricular  hypertrophy. Left ventricular diastolic parameters are indeterminate.   3. Right ventricular systolic function was not well visualized. The right  ventricular  size is not well visualized.   4. The mitral valve was not well visualized. No evidence of mitral valve  regurgitation.   5. The aortic valve was not well visualized. Aortic valve regurgitation  is not visualized. No aortic stenosis is present.   6. Aortic dilatation noted. There is mild dilatation of the aortic root,  measuring 40 mm.   7. The inferior vena cava is normal in size with <50% respiratory  variability, suggesting right atrial pressure of 8 mmHg.   Comparison(s): A prior study was performed on 08/29/2019. Prior images  reviewed side by side. Difficult comparsion- this study is much more  technically difficult. Significant increase in pericardial effusion.  Primary cardiology team made aware.  8 day Zio Monitor 08/22/2020: One 4 beat run of NSVT 6 runs of SVT, longest lasting 16 beats Occasional PVCs (3.9%) 8 days of data recorded on Zio monitor. Patient had a min HR of 62 bpm, max HR of 162 bpm, and avg HR of 86 bpm. Predominant underlying rhythm was Sinus Rhythm. No atrial fibrillation, high degree block, or pauses noted. One 4 beat run of NSVT.  6 runs of SVT, longest lasting 16 beats.  Isolated atrial ectopy was rare (<1%).  Isolated ventricular ectopy was occasional (3.9%).  There were 0 triggered events.    Cardiac MRI 10/07/19: 1. Limited study, as only a few sequences were able to be completed due to limitations from presence of deep brain stimulator 2. Basal septal hypertrophy measuring up to 97m  (lateral wall 12 mm), consistent with hypertrophic cardiomyopathy 3. Patchy late gadolinium enhancement in basal septum, consistent with HCM 4. Basal inferolateral midwall LGE, which would not be a typical pattern for HCM, as more commonly seen in setting of prior myocarditis or sarcoidosis. 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  Normal LV size with hyperdynamic systolic function (EF 681% 6.  Normal RV size and systolic function (EF 627%  TTE 08/29/19:  1. Technically difficult study. Left ventricular ejection fraction appears grossly normal, approximately 55-60%, though difficult visualization even with contrast  2. There is asymmetric basal septal hypertrophy measuring 18 mm in basal septum (12 mm posterior wall). Consider cardiac MRI to assess for hypertrophic cardiomyopathy if clinically indicated  3. Definity contrast agent was given IV to delineate the left ventricular endocardial borders.  4. Global right ventricle has normal systolic function.The right ventricular size is normal. No increase in right ventricular wall thickness.  5. There is mild dilatation of the aortic root measuring 39 mm.  6. The inferior vena cava is dilated in size with <50% respiratory variability, suggesting right atrial pressure of 15 mmHg.  PYP scan 10/25/19: The study is normal. No evidence of TTR amyloidosis.   Cardiac monitor 11/11/19: No significant abnormalities No atrial fibrillation. No VT Occasional PVCs (1.3% of beats). 1 patient triggered event, which appears to correspond to short pause (1.1 seconds) from a blocked PA   Predominant rhythm is sinus rhythm. Range is 57 to 137 bpm with average of 89 bpm. No atrial fibrillation, sustained ventricular tachycardia, significant pause, or high degree AV block. Occasional PVCs (1.3% of beats). 1 patient triggered events. Triggered event appears to correspond to short pause (1.1 seconds) from a blocked  PAC.  No significant abnormalities.    Recent Labs: 07/30/2022: ALT 30; BNP 19.6; BUN 23; Creatinine, Ser 1.71; Hemoglobin 13.0; Platelets 267; Potassium 4.8; Sodium 143  Recent Lipid Panel    Component Value Date/Time  CHOL 110 04/23/2021 0717   CHOL 161 10/24/2020 0943   TRIG 172 (H) 04/23/2021 0717   HDL 23 (L) 04/23/2021 0717   HDL 30 (L) 10/24/2020 0943   CHOLHDL 4.8 04/23/2021 0717   VLDL 34 04/23/2021 0717   LDLCALC 53 04/23/2021 0717   LDLCALC 99 10/24/2020 0943    Physical Exam:    VS:  There were no vitals taken for this visit.    Wt Readings from Last 3 Encounters:  07/30/22 253 lb 12.8 oz (115.1 kg)  04/14/22 262 lb (118.8 kg)  02/17/22 256 lb (116.1 kg)     GEN: Well nourished, well developed in no acute distress HEENT: Normal NECK: No JVD CARDIAC: RRR, no murmurs, rubs, gallops RESPIRATORY:  Clear to auscultation without rales, wheezing or rhonchi  ABDOMEN: Soft, non-tender, non-distended MUSCULOSKELETAL:  No edema; No deformity  SKIN: Warm and dry NEUROLOGIC:  Alert and oriented x 3 PSYCHIATRIC:  Normal affect   ASSESSMENT:    No diagnosis found.   PLAN:    Hypertrophic cardiomyopathy: 17m (lateral wall 12 mm) on CMR.  In addition, had unusual scar pattern for HCM, with basal inferolateral scar on MRI.  Fabry's disease can be associated with this scar pattern and asymmetric hypertrophy, so alpha galactosidase was checked and was normal. Scar pattern not c/w amyloid on CMR, but unfortunately unable to do T1 mapping to r/o amyloid due to DBS.  No evidence of amyloid on PYP scan in 2020.  Scar pattern may represent prior episode of myocarditis.  Suspect HCM.  No obstruction.  Recommended screening of first-degree relatives with TTE, he reports that he has discussed with his sons.  Cardiac monitor on 08/22/2020 showed one 4 beat run of NSVT, 6 runs of SVT longest lasting 16 beats, occasional PVCs (4%).  Echocardiogram 08/14/2022 showed EF 55%, moderate LVH,  grade 1 diastolic dysfunction, normal RV function, no significant valvular disease, dilated ascending aorta measuring 43 mm.  -SPEP/UPEP/light chains do not show evidence of AL amyloid.  PYP scan negative in 2020, repeated given high clinical concern for amyloid; PYP on 06/07/2021 not suggestive of amyloidosis. -Continue metoprolol, will consolidate to Toprol-XL 50 mg daily -Favor avoiding scheduled diuretics given suspected HCM.  Appears euvolemic.  Continue Lasix as needed.  Asked to monitor daily weights and take if gains more than 3 pounds in 1 day or 5 pounds in 1 week  Pericardial effusion: Large effusion noted on echocardiogram 01/2021.  Started on colchicine for suspected pericarditis, completed 3 months course.  Repeat echocardiogram 03/12/2021 shows resolution of effusion  Dyspnea: Reports dyspnea with minimal exertion.  Lexiscan Myoview 01/2021 with no evidence of ischemia.  Echocardiogram 08/14/2022 showed EF 55%, moderate LVH, grade 1 diastolic dysfunction, normal RV function, no significant valvular disease, dilated ascending aorta measuring 43 mm.  CVA: Subclavian artery thrombosis diagnosed, unclear if cardioembolic source or developed in situ.    No AF on cardiac monitor.  Continue Eliquis 5 mg twice daily and aspirin 81 mg daily added per neurology   Hypertension: On amlodipine 10 mg daily,  losartan 100 mg daily, Toprol-XL 50 mg daily   Hyperlipidemia: On atorvastatin 80 mg daily.  LDL 99 on 10/24/2020.  Calcium score 588 (68th percentile).  Zetia 10 mg daily was added.  LDL 71 on 09/16/21.   Type 2 diabetes: A1c 8.9% on 01/30/2021.  On insulin  PVCs: occasional (1.3%) on monitor.  He is asymptomatic and with normal LV systolic function, no treatment indicated  Leg pain: reports  leg pain with walking, normal ABIs on 03/10/2022  Dilated ascending aorta: Measured 43 mm on echo 07/2022, will monitor   RTC in 6 months    Medication Adjustments/Labs and Tests Ordered: Current  medicines are reviewed at length with the patient today.  Concerns regarding medicines are outlined above.  No orders of the defined types were placed in this encounter.  No orders of the defined types were placed in this encounter.   There are no Patient Instructions on file for this visit.      Signed, Donato Heinz, MD  09/16/2022 5:57 AM    [place]

## 2022-09-23 DIAGNOSIS — M25552 Pain in left hip: Secondary | ICD-10-CM | POA: Diagnosis not present

## 2022-09-23 DIAGNOSIS — M9905 Segmental and somatic dysfunction of pelvic region: Secondary | ICD-10-CM | POA: Diagnosis not present

## 2022-09-23 DIAGNOSIS — M5384 Other specified dorsopathies, thoracic region: Secondary | ICD-10-CM | POA: Diagnosis not present

## 2022-09-23 DIAGNOSIS — M9902 Segmental and somatic dysfunction of thoracic region: Secondary | ICD-10-CM | POA: Diagnosis not present

## 2022-09-24 ENCOUNTER — Ambulatory Visit: Payer: Medicare PPO | Admitting: Podiatry

## 2022-09-24 DIAGNOSIS — L97522 Non-pressure chronic ulcer of other part of left foot with fat layer exposed: Secondary | ICD-10-CM

## 2022-09-24 DIAGNOSIS — E0843 Diabetes mellitus due to underlying condition with diabetic autonomic (poly)neuropathy: Secondary | ICD-10-CM

## 2022-09-24 NOTE — Progress Notes (Signed)
Chief Complaint  Patient presents with   Callouses    Callus on his left ball of foot is bleeding. Patient was prescribed antibiotics in September 2023 and he finished course of antibiotic.     Subjective:  71 y.o. male with PMHx of DM type II presenting for recurrence of an ulcer to the plantar aspect of the left first MTP joint.  Routine care with Dr. Jacqualyn Posey.  Patient noticed bleeding to the wound last night and presents today as an urgent work in   Past Medical History:  Diagnosis Date   Benign essential tremor    Benign positional vertigo    CVA (cerebral vascular accident) ([place])    x2 - L retina, 1 right parietal   Degenerative arthritis    Depression    Diabetes mellitus    DVT (deep venous thrombosis) ([place]) 2018   Dyslipidemia    GERD (gastroesophageal reflux disease)    hiatal hernia   Gout    H/O: vasectomy    Hearing aid worn    b/l   Hx of appendectomy    Hx of tonsillectomy    Hypertension    Hypertrophic cardiomyopathy ([place])    Ischemic optic neuropathy    on the left   Melanoma ([place])    NSVT (nonsustained ventricular tachycardia) ([place])    1 4 beat run on event monitor in 07/2020   Obesity    OSA on CPAP    setting = 5   Pulmonary emboli ([place]) 2018   PVC's (premature ventricular contractions)    SVT (supraventricular tachycardia) ([facility])    by event monitor   Tremor, essential 06/22/2017   Wears glasses     Past Surgical History:  Procedure Laterality Date   APPENDECTOMY     arthroscopic knee surgery Bilateral    CATARACT EXTRACTION Bilateral    COLONOSCOPY     MINOR PLACEMENT OF FIDUCIAL N/A 06/30/2019   Procedure: Fiducial placement;  Surgeon: Erline Levine, MD;  Location: [place];  Service: Neurosurgery;  Laterality: N/A;  Fiducial placement   NASAL SEPTUM SURGERY     PULSE GENERATOR IMPLANT N/A 07/14/2019   Procedure: Left cranial Implanted Pulse Generator and lead extension placement to right chest ;  Surgeon: Erline Levine, MD;  Location: [place];   Service: Neurosurgery;  Laterality: N/A;   SUBTHALAMIC STIMULATOR INSERTION Left 07/07/2019   Procedure: LEFT DEEP BRAIN STIMULATOR PLACEMENT;  Surgeon: Erline Levine, MD;  Location: [place];  Service: Neurosurgery;  Laterality: Left;   TEE WITHOUT CARDIOVERSION N/A 04/23/2021   Procedure: TRANSESOPHAGEAL ECHOCARDIOGRAM (TEE);  Surgeon: Acie Fredrickson Wonda Cheng, MD;  Location: [facility] ENDOSCOPY;  Service: Cardiovascular;  Laterality: N/A;   TONSILLECTOMY     TOTAL KNEE ARTHROPLASTY Left 03/30/2017   Procedure: LEFT TOTAL KNEE ARTHROPLASTY;  Surgeon: Paralee Cancel, MD;  [facility];  Service: Orthopedics;  Laterality: Left;   WISDOM TOOTH EXTRACTION      Allergies  Allergen Reactions   Melatonin Other (See Comments)    dizzy      Objective/Physical Exam General: The patient is alert and oriented x3 in no acute distress.  Dermatology:  Wound #1 noted to the plantar aspect of the first MTP measuring approximately 1.0 x 1.0 x 0.2 cm (LxWxD).   To the noted ulceration(s), there is no eschar. There is a moderate amount of slough, fibrin, and necrotic tissue noted. Granulation tissue and wound base is red. There is a minimal amount of serosanguineous drainage noted. There is no exposed bone  muscle-tendon ligament or joint. There is no malodor. Periwound integrity is intact. Skin is warm, dry and supple bilateral lower extremities.  Vascular: Skin is warm to touch.  Palpable pedal pulses bilaterally. No edema or erythema noted.  Capillary refill immediate.  Clinically no concern for vascular compromise  Neurological: Light touch and protective threshold diminished bilaterally.   Musculoskeletal Exam: No amputations.  No gross deformity.  Assessment: 1.  Ulcer plantar aspect of the first MTP left secondary to diabetes mellitus 2. diabetes mellitus w/ peripheral neuropathy   Plan of Care:  1. Patient was evaluated. 2. medically necessary excisional debridement including subcutaneous tissue was  performed using a tissue nipper and a chisel blade. Excisional debridement of all the necrotic nonviable tissue down to healthy bleeding viable tissue was performed with post-debridement measurements same as pre-. 3. the wound was cleansed and dry sterile dressing applied. 4.  Continue diabetic shoes with insoles and offloading padding to offload pressure from the first MTP  5.  Patient has Silvadene cream that he applies daily.  Continue with a light dressing  6.  Return to clinic 4 weeks  Edrick Kins, DPM [facility]  Dr. Edrick Kins, DPM    [address], [place] [address]                Office [phone]  Fax [phone]

## 2022-09-25 DIAGNOSIS — H04123 Dry eye syndrome of bilateral lacrimal glands: Secondary | ICD-10-CM | POA: Diagnosis not present

## 2022-09-25 NOTE — Progress Notes (Deleted)
Cardiology Clinic Note   Patient Name: Joshua [name] Date of Encounter: 09/25/2022  Primary Care Provider:  [name], MD Primary Cardiologist:  Joshua Heinz, MD  Patient Profile    71 year old male with history of HTN, Hypertrophic cardiomyopathy, Diabetes, OSA, and PE s/p knee surgery, CVA 2020, and essential tremor. 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  MRI brain showed right internal capsule infarct.  Echocardiogram showed no cardiogenic source of embolism. Cardiac MRI was ordered, which showed Basal septal hypertrophy measuring up to 38m (lateral wall 122m, consistent with hypertrophic cardiomyopathy.  PYP scan showed no evidence of amyloid.Patient had myocarditis and pericardial effusion 01/2019.     He was seen last by Joshua [name] 02/12/2022 and was without cardiac complaints. A follow up Echocardiogram (07/2022) was ordered for re-evaluation revealing EF of 55%, moderate concentric left ventricular hypertrophy. Left ventricular diastolic  parameters are consistent with Grade I diastolic dysfunction (impaired relaxation). 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   Past Medical History    Past Medical History:  Diagnosis Date   Benign essential tremor    Benign positional vertigo    CVA (cere[facility] vascular accident) ([facility]   x2 - L retina, 1 right parietal   Degenerative arthritis    Depression    Diabetes mellitus    DVT (deep venous thrombosis) ([facility]   Dyslipidemia    GERD (gastroesophageal reflux disease)    hiatal hernia   Gout    H/O: vasectomy    Hearing aid worn    b/l   Hx of appendectomy    Hx of tonsillectomy    Hypertension    Hy[place]rophic cardiomyopathy [place] [place]   Ischemic optic neuropathy    on the left   Melanoma ([facility]   NSVT (nonsustained ventricular tachycardia) ([facility]   1 4 beat run on event monitor in 07/2020   Obesity    OSA on CPAP    setting = 5   Pulmonary emboli ([facility]2018   PVC's (premature  ventricular contractions)    SVT (supraventricular tachycardia) (H[facility])    by event monitor   Tremor, essential 06/22/2017   Wears glasses    Past Surgical History:  Procedure Laterality Date   APPENDECTOMY     arthroscopic knee surgery Bilateral    CATARACT EXTRACTION Bilateral    COLONOSCOPY     MINOR PLACEMENT OF FIDUCIAL N/A 06/30/2019   Procedure: Fiducial placement;  Surgeon: Joshua LevineMD;  [facility];  Laterality: N/A;  Fiducial placement   NASAL SEPTUM SURGERY     PULSE GENERATOR IMPLANT N/A 07/14/2019   Procedure: Left cranial Implanted Pulse Generator and lead extension placement to right chest ;  Surgeon: Joshua LevineMD;  [place] [facility]: [facility]surgery;  Laterality: N/A;   SUBTHALAMIC STIMULATOR INSERTION Left 07/07/2019   Procedure: LEFT DEEP BRAIN STIMULATOR PLACEMENT;  Surgeon: Joshua LevineMD;  [facility]: [facility]surgery;  Laterality: Left;   TEE WITHOUT CARDIOVERSION N/A 04/23/2021   Procedure: TRANSESOPHAGEAL ECHOCARDIOGRAM (TEE);  Surgeon: Joshua FredricksonhWonda ChengMD;  [facility];  Service: Cardiovascular;  Laterality: N/A;   TONSILLECTOMY     TOTAL KNEE ARTHROPLASTY Left 03/30/2017   Procedure: LEFT TOTAL KNEE ARTHROPLASTY;  Surgeon: Joshua CancelMD;  Location: [facility];  Service: Orthopedics;  Laterality: Left;   WISDOM TOOTH EXTRACTION      Allergies  Allergies  Allergen Reactions   Melatonin Other (  See Comments)    dizzy    History of Present Illness    Joshua [name] comes today for ongoing assessment and management HTN, Hypertrophic cardiomyopathy, Diabetes, OSA, and PE, He continues  on Eliquis for subclavian artery thrombosis.   Home Medications    Current Outpatient Medications  Medication Sig Dispense Refill   amLODipine (NORVASC) 10 MG tablet TAKE ONE TABLET AT BEDTIME. 90 tablet 3   apixaban (ELIQUIS) 5 MG TABS tablet Take 1 tablet (5 mg total) by mouth 2 (two) times daily. 180 tablet 2    aspirin 81 MG EC tablet 1 tablet     atorvastatin (LIPITOR) 80 MG tablet Take 1 tablet (80 mg total) by mouth daily at 6 PM. (Patient taking differently: Take 40 mg by mouth daily at 6 PM.) 30 tablet 2   clindamycin (CLEOCIN) 150 MG capsule Take 1 capsule (150 mg total) by mouth every 6 (six) hours. 28 capsule 0   colchicine 0.6 MG tablet colchicine 0.6 mg tablet  TAKE ONE TABLET BY MOUTH TWICE DAILY.     Continuous Blood Gluc Sensor (FREESTYLE LIBRE 2 SENSOR) MISC See admin instructions.     cycloSPORINE (RESTASIS) 0.05 % ophthalmic emulsion Apply to eye.     doxycycline (VIBRA-TABS) 100 MG tablet Take 1 tablet (100 mg total) by mouth 2 (two) times daily. 14 tablet 0   DULoxetine (CYMBALTA) 60 MG capsule Take 60 mg by mouth daily.     escitalopram (LEXAPRO) 20 MG tablet Take 20 mg by mouth daily.     ezetimibe (ZETIA) 10 MG tablet TAKE ONE TABLET BY MOUTH ONCE DAILY. 90 tablet 3   fenofibrate 54 MG tablet Take 54 mg by mouth daily.     furosemide (LASIX) 40 MG tablet Take 1 tablet (40 mg total) by mouth as needed (Weight increase of 3 pounds in 1 day or 5 pounds in 1 week.). 30 tablet 3   Insulin Glargine, 1 Unit Dial, (TOUJEO SOLOSTAR) 300 UNIT/ML SOPN Inject 80 Units into the skin 2 (two) times daily.     insulin lispro (HUMALOG) 100 UNIT/ML KwikPen Inject 15-25 Units into the skin See admin instructions. Inject 15 units subcutaneously after breakfast and 25 units after lunch and supper     losartan (COZAAR) 100 MG tablet Take 1 tablet by mouth daily.     Lutein-Zeaxanthin 25-5 MG CAPS Take 1 tablet by mouth daily.     metFORMIN (GLUCOPHAGE) 500 MG tablet Take 500 mg by mouth daily with breakfast.     metFORMIN (GLUCOPHAGE) 500 MG tablet 1 tablet with a meal     metoprolol succinate (TOPROL-XL) 50 MG 24 hr tablet Take 1 tablet (50 mg total) by mouth daily. Take with or immediately following a meal. 90 tablet 3   mirtazapine (REMERON) 7.5 MG tablet Take 7.5 mg by mouth at bedtime.     Multiple  Vitamin (MULTIVITAMIN WITH MINERALS) TABS tablet Take 1 tablet by mouth daily.     Omega-3 Fatty Acids (FISH OIL) 1000 MG CAPS Take 2,000 mg by mouth daily. Reported on 02/13/2016     ONETOUCH VERIO test strip      Polyethyl Glycol-Propyl Glycol (SYSTANE OP) Place 1-2 drops into both eyes 4 (four) times daily as needed (dry eyes).      PRESCRIPTION MEDICATION Inhale into the lungs at bedtime. CPAP     RESTASIS 0.05 % ophthalmic emulsion Place 1 drop into both eyes 2 (two) times daily as needed (dry eyes).     silver  sulfADIAZINE (SILVADENE) 1 % cream Apply 1 Application topically daily. 50 g 0   SURE COMFORT PEN NEEDLES 31G X 5 MM MISC      No current facility-administered medications for this visit.     Family History    Family History  Problem Relation Age of Onset   Cerebral aneurysm Mother    Tremor Mother    Alzheimer's disease Father    Tremor Brother    Tremor Maternal Uncle    Diabetes Maternal Grandmother    Healthy Son    Colon cancer Neg Hx    He indicated that his mother is deceased. He indicated that his father is deceased. He indicated that his brother is alive. He indicated that the status of his maternal grandmother is unknown. He indicated that his son is alive. He indicated that the status of his maternal uncle is unknown. He indicated that the status of his neg hx is unknown.  Social History    Social History   Socioeconomic History   Marital status: Married    Spouse name: [name]   Number of children: 2   Years of education: Bachelors    Highest education level: Bachelor's degree (e.g., BA, AB, BS)  Occupational History   Occupation: retired    Fish farm manager: [employer]    Comment: teaching/coaching  Tobacco Use   Smoking status: Former    Packs/day: 1.00    Years: 10.00    Total pack years: 10.00    Types: Cigarettes    Quit date: 11/25/1983    Years since quitting: 38.8   Smokeless tobacco: Never  Vaping Use   Vaping Use: Never used   Substance and Sexual Activity   Alcohol use: Yes    Alcohol/week: 2.0 standard drinks of alcohol    Types: 2 Standard drinks or equivalent per week    Comment: occassionally   Drug use: No   Sexual activity: Not on file  Other Topics Concern   Not on file  Social History Narrative   Patient lives at home with wife. [name](   Patient has 2 children that are in good health.    Patient works for [employer]. Retired .   Patient has a Bachelors degree in History.       Right handed    Lives in one story home - Handicap accessible    Social Determinants of Health   Financial Resource Strain: Not on file  Food Insecurity: Not on file  Transportation Needs: Not on file  Physical Activity: Not on file  Stress: Not on file  Social Connections: Not on file  Intimate Partner Violence: Not on file     Review of Systems    General:  No chills, fever, night sweats or weight changes.  Cardiovascular:  No chest pain, dyspnea on exertion, edema, orthopnea, palpitations, paroxysmal nocturnal dyspnea. Dermatological: No rash, lesions/masses Respiratory: No cough, dyspnea Urologic: No hematuria, dysuria Abdominal:   No nausea, vomiting, diarrhea, bright red blood per rectum, melena, or hematemesis Neurologic:  No visual changes, wkns, changes in mental status. All other systems reviewed and are otherwise negative except as noted above.     Physical Exam    VS:  There were no vitals taken for this visit. , BMI There is no height or weight on file to calculate BMI.     GEN: Well nourished, well developed, in no acute distress. HEENT: normal. Neck: Supple, no JVD, carotid bruits, or masses. Cardiac: RRR, no murmurs, rubs,  or gallops. No clubbing, cyanosis, edema.  Radials/DP/PT 2+ and equal bilaterally.  Respiratory:  Respirations regular and unlabored, clear to auscultation bilaterally. GI: Soft, nontender, nondistended, BS + x 4. MS: no deformity or atrophy. Skin: warm  and dry, no rash. Neuro:  Strength and sensation are intact. Psych: Normal affect.  Accessory Clinical Findings    ECG personally reviewed by me today- *** - No acute changes  Lab Results  Component Value Date   WBC 9.1 07/30/2022   HGB 13.0 07/30/2022   HCT 39.0 07/30/2022   MCV 90 07/30/2022   PLT 267 07/30/2022   Lab Results  Component Value Date   CREATININE 1.71 (H) 07/30/2022   BUN 23 07/30/2022   NA 143 07/30/2022   K 4.8 07/30/2022   CL 104 07/30/2022   CO2 24 07/30/2022   Lab Results  Component Value Date   ALT 30 07/30/2022   AST 19 07/30/2022   ALKPHOS 103 07/30/2022   BILITOT 0.3 07/30/2022   Lab Results  Component Value Date   CHOL 110 04/23/2021   HDL 23 (L) 04/23/2021   LDLCALC 53 04/23/2021   TRIG 172 (H) 04/23/2021   CHOLHDL 4.8 04/23/2021    Lab Results  Component Value Date   HGBA1C 7.9 (H) 04/21/2021    Review of Prior Studies: Echocardiogram 08/14/2022 1. Left ventricular ejection fraction, by estimation, is 55%. The left  ventricle has normal function. The left ventricle has no regional wall  motion abnormalities. There is moderate concentric left ventricular  hypertrophy. Left ventricular diastolic  parameters are consistent with Grade I diastolic dysfunction (impaired  relaxation).   2. Right ventricular systolic function is normal. The right ventricular  size is normal. There is normal pulmonary artery systolic pressure. The  estimated right ventricular systolic pressure is 83.4 mmHg.   3. The mitral valve is normal in structure. No evidence of mitral valve  regurgitation. No evidence of mitral stenosis.   4. The aortic valve is tricuspid. There is moderate calcification of the  aortic valve. Aortic valve regurgitation is not visualized. Aortic valve  sclerosis/calcification is present, without any evidence of aortic  stenosis.   5. Aortic dilatation noted. There is mild dilatation of the ascending  aorta, measuring 43 mm.   6.  The inferior vena cava is normal in size with greater than 50%  respiratory variability, suggesting right atrial pressure of 3 mmHg.   EE 04/23/2021: 1. Left ventricular ejection fraction, by estimation, is 60 to 65%. The  left ventricle has normal function.   2. Right ventricular systolic function is normal. The right ventricular  size is normal.   3. No left atrial/left atrial appendage thrombus was detected.   4. Systolic anterior motion of the mitral valve leaflets is present . The  mitral valve is normal in structure. No evidence of mitral valve  regurgitation.   5. The aortic valve is normal in structure. Aortic valve regurgitation is  not visualized.    Echo 03/08/2021:  1. Although overall left ventricular GLS is normal, the distribution of  strain abnormalities is strongly suggestive of cardiac amyloidosis  ("cherry on top" pattern). Left ventricular ejection fraction, by  estimation, is 55 to 60%. The left ventricle has   normal function. The left ventricle has no regional wall motion  abnormalities. There is moderate concentric left ventricular hypertrophy.  Left ventricular diastolic parameters are consistent with Grade I  diastolic dysfunction (impaired relaxation). The  average left ventricular global longitudinal strain is -19.1 %.  The global  longitudinal strain is normal.   2. Right ventricular systolic function is normal. The right ventricular  size is normal.   3. The mitral valve is normal in structure. No evidence of mitral valve  regurgitation.   4. The aortic valve is tricuspid. Aortic valve regurgitation is not  visualized. Mild to moderate aortic valve sclerosis/calcification is  present, without any evidence of aortic stenosis.   5. Aortic dilatation noted. There is moderate dilatation of the ascending  aorta, measuring 45 mm.   Comparison(s): No significant change from prior study. Prior images  reviewed side by side. Pericardial effusion has resolved.  Note GLS pattern  and marked LVH suggestive of cardiac amyloidosis, but also note normal  Tc16mPYP scan. Consider evaluation for  light chain disease if not yet performed.   Lexiscan Myoview 02/12/2021: Nuclear stress EF: 52%. There was no ST segment deviation noted during stress. This is a low risk study with no evidence of ischemia. The left ventricular ejection fraction is mildly decreased (45-54%). Visually, systolic function appears normal.   Echo 01/30/2021: 1. Large pericardial effusion. The pericardial effusion is anterior to  the right ventricle. There is no evidence of increased pericardial  pressure. Echodensity within the effusion may suggest exudative nature.   2. Left ventricular ejection fraction, by estimation, is 55 to 60%. The  left ventricle has normal function. Left ventricular endocardial border  not optimally defined to evaluate regional wall motion. There is moderate  concentric left ventricular  hypertrophy. Left ventricular diastolic parameters are indeterminate.   3. Right ventricular systolic function was not well visualized. The right  ventricular size is not well visualized.   4. The mitral valve was not well visualized. No evidence of mitral valve  regurgitation.   5. The aortic valve was not well visualized. Aortic valve regurgitation  is not visualized. No aortic stenosis is present.   6. Aortic dilatation noted. There is mild dilatation of the aortic root,  measuring 40 mm.   7. The inferior vena cava is normal in size with <50% respiratory  variability, suggesting right atrial pressure of 8 mmHg.   Comparison(s): A prior study was performed on 08/29/2019. Prior images  reviewed side by side. Difficult comparsion- this study is much more  technically difficult. Significant increase in pericardial effusion.  Primary cardiology team made aware.   8 day Zio Monitor 08/22/2020: One 4 beat run of NSVT 6 runs of SVT, longest lasting 16 beats Occasional  PVCs (3.9%) 8 days of data recorded on Zio monitor. Patient had a min HR of 62 bpm, max HR of 162 bpm, and avg HR of 86 bpm. Predominant underlying rhythm was Sinus Rhythm. No atrial fibrillation, high degree block, or pauses noted. One 4 beat run of NSVT.  6 runs of SVT, longest lasting 16 beats.  Isolated atrial ectopy was rare (<1%).  Isolated ventricular ectopy was occasional (3.9%).  There were 0 triggered events.      Cardiac MRI 10/07/19: 1. Limited study, as only a few sequences were able to be completed due to limitations from presence of deep brain stimulator 2. Basal septal hypertrophy measuring up to 243m(lateral wall 12 mm), consistent with hypertrophic cardiomyopathy 3. Patchy late gadolinium enhancement in basal septum, consistent with HCM 4. Basal inferolateral midwall LGE, which would not be a typical pattern for HCM, as more commonly seen in setting of prior myocarditis or sarcoidosis. 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  Normal  LV size with hyperdynamic systolic function (EF 24%) 6.  Normal RV size and systolic function (EF 40%)   TTE 08/29/19:  1. Technically difficult study. Left ventricular ejection fraction appears grossly normal, approximately 55-60%, though difficult visualization even with contrast  2. There is asymmetric basal septal hypertrophy measuring 18 mm in basal septum (12 mm posterior wall). Consider cardiac MRI to assess for hypertrophic cardiomyopathy if clinically indicated  3. Definity contrast agent was given IV to delineate the left ventricular endocardial borders.  4. Global right ventricle has normal systolic function.The right ventricular size is normal. No increase in right ventricular wall thickness.  5. There is mild dilatation of the aortic root measuring 39 mm.  6. The inferior vena cava is dilated in size with <50% respiratory variability, suggesting right atrial pressure of 15 mmHg.    PYP scan 10/25/19: The study is normal. No evidence of TTR amyloidosis.   Cardiac monitor 11/11/19: No significant abnormalities No atrial fibrillation. No VT Occasional PVCs (1.3% of beats). 1 patient triggered event, which appears to correspond to short pause (1.1 seconds) from a blocked PA   Predominant rhythm is sinus rhythm. Range is 57 to 137 bpm with average of 89 bpm. No atrial fibrillation, sustained ventricular tachycardia, significant pause, or high degree AV block. Occasional PVCs (1.3% of beats). 1 patient triggered events. Triggered event appears to correspond to short pause (1.1 seconds) from a blocked PAC.  No significant abnormalities.    Assessment & Plan   1.  ***    Current medicines are reviewed at length with the patient today.  I have spent *** min's  dedicated to the care of this patient on the date of this encounter to include pre-visit review of records, assessment, management and diagnostic testing,with shared decision making. Signed, Phill Myron. West Pugh, ANP, [place]   09/25/2022 10:43 AM      Office [phone] Fax [phone]  Notice: This dictation was prepared with Dragon dictation along with smaller phrase technology. Any transcriptional errors that result from this process are unintentional and may not be corrected upon review.

## 2022-09-26 ENCOUNTER — Ambulatory Visit: Payer: Medicare PPO | Attending: Cardiology | Admitting: Adult Health

## 2022-09-29 ENCOUNTER — Ambulatory Visit: Payer: Medicare PPO | Admitting: Podiatry

## 2022-10-03 DIAGNOSIS — E1121 Type 2 diabetes mellitus with diabetic nephropathy: Secondary | ICD-10-CM | POA: Diagnosis not present

## 2022-10-06 ENCOUNTER — Encounter: Payer: Self-pay | Admitting: Adult Health

## 2022-10-07 DIAGNOSIS — R7989 Other specified abnormal findings of blood chemistry: Secondary | ICD-10-CM | POA: Diagnosis not present

## 2022-10-07 DIAGNOSIS — Z125 Encounter for screening for malignant neoplasm of prostate: Secondary | ICD-10-CM | POA: Diagnosis not present

## 2022-10-07 DIAGNOSIS — M9905 Segmental and somatic dysfunction of pelvic region: Secondary | ICD-10-CM | POA: Diagnosis not present

## 2022-10-07 DIAGNOSIS — M109 Gout, unspecified: Secondary | ICD-10-CM | POA: Diagnosis not present

## 2022-10-07 DIAGNOSIS — E1121 Type 2 diabetes mellitus with diabetic nephropathy: Secondary | ICD-10-CM | POA: Diagnosis not present

## 2022-10-07 DIAGNOSIS — M5384 Other specified dorsopathies, thoracic region: Secondary | ICD-10-CM | POA: Diagnosis not present

## 2022-10-07 DIAGNOSIS — M9902 Segmental and somatic dysfunction of thoracic region: Secondary | ICD-10-CM | POA: Diagnosis not present

## 2022-10-07 DIAGNOSIS — E785 Hyperlipidemia, unspecified: Secondary | ICD-10-CM | POA: Diagnosis not present

## 2022-10-07 DIAGNOSIS — M25552 Pain in left hip: Secondary | ICD-10-CM | POA: Diagnosis not present

## 2022-10-13 ENCOUNTER — Telehealth: Payer: Self-pay | Admitting: Neurology

## 2022-10-13 NOTE — Progress Notes (Signed)
Assessment/Plan:   1.  Essential Tremor.  -The patient is status post left VIM DBS on July 07, 2019 with [facility] device.  Intention was originally to do a bilateral VIM DBS, but the patient had a vagal episode in the operating room with associated nausea and vomiting with positive CSF flow out the burr hole, and it was decided not to attempt the other side.    -reprogrammed dbs today.  -Unfortunately, I think that the patient has developed ataxic tremor.  This is a very different tremor type and virtually impossible to control with DBS.  His tremor is fairly well-controlled until his May, 2022 infarct, which was positioned very close to the DBS lead in the thalamus.  I think that this likely affected the thalmocortical projection pathways and cerebellar outflow tracts.  I am going to have him get another opinion with Dr. Linus Mako and see if there is anything more to offer him.  Pt is agreeable.   2.  History of cerebral infarct, October, 2020 and May, 2022  -MRI in October, 2020 demonstrated acute right posterior limb of the internal capsule infarct.  However, stroke neurology felt that symptoms were more consistent with a posterior circulation stroke, related to thrombus in the left subclavian   -MRI in 2022 with small infarct in the thalamus on the left.  TEE was negative.  Patient was already on Eliquis (had just been restarted a few weeks before, as had been stopped for pericarditis).  -On atorvastatin, 80 mg, Zetia 10 mg.  LDL slightly above goal at 99 (goal less than 70)  -Patient's current hemoglobin A1c of 7.9, goal less than 7.0  -Patient currently on amlodipine 10, losartan 100 mg, Lopressor 25 mg twice per day -long-term blood pressure goal normotensive  -Patient on Eliquis.    3.  Dysphagia  -Modified barium swallow in June, 2022 was worse compared to October, 2021 with evidence of pharyngeal phase dysphagia.  4.  Hypertrophic cardiomyopathy  -Following with cardiology.     -suspect that the prominent breathing is related to HCM.  He will f/u with cardiology   Subjective:   Joshua [name] was seen in follow-up in the movement disorder clinic for essential tremor.  We reprogrammed his device last visit and he did well, as always, for a few months and then tremor came back.  He called [place] in July and we offered him a second opinion.  He is ready to do that now.  He asks today about prominent breathing.  He has cardiomyopathy and osas.  He is faithful with cpap.    Allergies  Allergen Reactions   Melatonin Other (See Comments)    dizzy    Current Outpatient Medications  Medication Instructions   amLODipine (NORVASC) 10 MG tablet TAKE ONE TABLET AT BEDTIME.   apixaban (ELIQUIS) 5 mg, Oral, 2 times daily   aspirin 81 MG EC tablet 1 tablet   atorvastatin (LIPITOR) 80 mg, Oral, Daily-1800   clindamycin (CLEOCIN) 150 mg, Oral, Every 6 hours   colchicine 0.6 MG tablet colchicine 0.6 mg tablet  TAKE ONE TABLET BY MOUTH TWICE DAILY.   Continuous Blood Gluc Sensor (FREESTYLE LIBRE 2 SENSOR) MISC See admin instructions   cycloSPORINE (RESTASIS) 0.05 % ophthalmic emulsion Ophthalmic   doxycycline (VIBRA-TABS) 100 mg, Oral, 2 times daily   DULoxetine (CYMBALTA) 60 mg, Oral, Daily   escitalopram (LEXAPRO) 20 mg, Oral, Daily   ezetimibe (ZETIA) 10 MG tablet TAKE ONE TABLET BY MOUTH ONCE DAILY.  fenofibrate 54 mg, Oral, Daily   Fish Oil 2,000 mg, Oral, Daily, Reported on 02/13/2016   furosemide (LASIX) 40 mg, Oral, As needed   insulin lispro (HUMALOG) 15-25 Units, Subcutaneous, See admin instructions, Inject 15 units subcutaneously after breakfast and 25 units after lunch and supper    losartan (COZAAR) 100 MG tablet 1 tablet, Oral, Daily   Lutein-Zeaxanthin 25-5 MG CAPS 1 tablet, Oral, Daily   metFORMIN (GLUCOPHAGE) 500 MG tablet 1 tablet with a meal   metFORMIN (GLUCOPHAGE) 500 mg, Oral, Daily with breakfast   metoprolol succinate (TOPROL-XL) 50 mg, Oral, Daily,  Take with or immediately following a meal.   mirtazapine (REMERON) 7.5 mg, Oral, Daily at bedtime   Multiple Vitamin (MULTIVITAMIN WITH MINERALS) TABS tablet 1 tablet, Oral, Daily   ONETOUCH VERIO test strip No dose, route, or frequency recorded.   Polyethyl Glycol-Propyl Glycol (SYSTANE OP) 1-2 drops, Both Eyes, 4 times daily PRN   PRESCRIPTION MEDICATION Inhalation, Daily at bedtime, CPAP    RESTASIS 0.05 % ophthalmic emulsion 1 drop, Both Eyes, 2 times daily PRN   silver sulfADIAZINE (SILVADENE) 1 % cream 1 Application, Topical, Daily   SURE COMFORT PEN NEEDLES 31G X 5 MM MISC No dose, route, or frequency recorded.   Toujeo SoloStar 80 Units, Subcutaneous, 2 times daily     Objective:   VITALS:   There were no vitals filed for this visit.     Gen:  Appears stated age and in NAD. HEENT:  Normocephalic, atraumatic. The mucous membranes are moist.     NEUROLOGICAL:  Orientation:  The patient is alert and oriented x 3.   Cranial nerves: There is good facial symmetry. Extraocular muscles are intact and visual fields are full to confrontational testing.  No significant dysphasia (has had this in the past) or dysarthria today.  Soft palate rises symmetrically and there is no tongue deviation. Hearing is intact to conversational tone. Tone: Tone is good throughout. Motor:  5/5 in the UE/LE   MOVEMENT EXAM: Movements: With the device off, there is very severe tremor on the right.  He has almost no tremor of the outstretched hands.  He has moderate tremor in the wing beating position on the right.  It is better post programming, but still prominent  I have reviewed and interpreted the following labs independently   Chemistry      Component Value Date/Time   NA 143 07/30/2022 1513   K 4.8 07/30/2022 1513   CL 104 07/30/2022 1513   CO2 24 07/30/2022 1513   BUN 23 07/30/2022 1513   CREATININE 1.71 (H) 07/30/2022 1513      Component Value Date/Time   CALCIUM 9.4 07/30/2022 1513    ALKPHOS 103 07/30/2022 1513   AST 19 07/30/2022 1513   ALT 30 07/30/2022 1513   BILITOT 0.3 07/30/2022 1513      Lab Results  Component Value Date   WBC 9.1 07/30/2022   HGB 13.0 07/30/2022   HCT 39.0 07/30/2022   MCV 90 07/30/2022   PLT 267 07/30/2022   Lab Results  Component Value Date   TSH 1.421 01/30/2021     CC:  [name], MD

## 2022-10-13 NOTE — Telephone Encounter (Signed)
See prior phone call.  Does pt want referral to another center re: dbs opinion.  We can certainly discuss it at his appt on Monday but wanted him to be aware dbs reps won't be here this time

## 2022-10-13 NOTE — Telephone Encounter (Signed)
Called and left voicemail for patient.

## 2022-10-13 NOTE — Telephone Encounter (Signed)
Called patient and let him know Rep would not be here and that Dr. Carles Collet would not have as much time to be able to spend with him. He said his right side is still not working at all. Patient doesn't remember discussing a referral to another center for DBS opinion

## 2022-10-14 DIAGNOSIS — I129 Hypertensive chronic kidney disease with stage 1 through stage 4 chronic kidney disease, or unspecified chronic kidney disease: Secondary | ICD-10-CM | POA: Diagnosis not present

## 2022-10-14 DIAGNOSIS — E1121 Type 2 diabetes mellitus with diabetic nephropathy: Secondary | ICD-10-CM | POA: Diagnosis not present

## 2022-10-14 DIAGNOSIS — R82998 Other abnormal findings in urine: Secondary | ICD-10-CM | POA: Diagnosis not present

## 2022-10-14 DIAGNOSIS — I6990 Unspecified sequelae of unspecified cerebrovascular disease: Secondary | ICD-10-CM | POA: Diagnosis not present

## 2022-10-14 DIAGNOSIS — Z0000 Encounter for general adult medical examination without abnormal findings: Secondary | ICD-10-CM | POA: Diagnosis not present

## 2022-10-14 DIAGNOSIS — F325 Major depressive disorder, single episode, in full remission: Secondary | ICD-10-CM | POA: Diagnosis not present

## 2022-10-14 DIAGNOSIS — Z1389 Encounter for screening for other disorder: Secondary | ICD-10-CM | POA: Diagnosis not present

## 2022-10-14 DIAGNOSIS — Z23 Encounter for immunization: Secondary | ICD-10-CM | POA: Diagnosis not present

## 2022-10-14 DIAGNOSIS — E1129 Type 2 diabetes mellitus with other diabetic kidney complication: Secondary | ICD-10-CM | POA: Diagnosis not present

## 2022-10-14 DIAGNOSIS — I517 Cardiomegaly: Secondary | ICD-10-CM | POA: Diagnosis not present

## 2022-10-14 DIAGNOSIS — N1831 Chronic kidney disease, stage 3a: Secondary | ICD-10-CM | POA: Diagnosis not present

## 2022-10-14 DIAGNOSIS — Z1331 Encounter for screening for depression: Secondary | ICD-10-CM | POA: Diagnosis not present

## 2022-10-20 ENCOUNTER — Ambulatory Visit (INDEPENDENT_AMBULATORY_CARE_PROVIDER_SITE_OTHER): Payer: Medicare PPO | Admitting: Neurology

## 2022-10-20 ENCOUNTER — Encounter: Payer: Self-pay | Admitting: Neurology

## 2022-10-20 VITALS — BP 123/82 | HR 89 | Ht 76.0 in | Wt 259.0 lb

## 2022-10-20 DIAGNOSIS — G250 Essential tremor: Secondary | ICD-10-CM

## 2022-10-20 DIAGNOSIS — Z9689 Presence of other specified functional implants: Secondary | ICD-10-CM | POA: Diagnosis not present

## 2022-10-20 DIAGNOSIS — I6330 Cerebral infarction due to thrombosis of unspecified cerebral artery: Secondary | ICD-10-CM | POA: Diagnosis not present

## 2022-10-20 NOTE — Procedures (Signed)
DBS Programming was performed.    Manufacturer of DBS device: [employer] was performed.    Manufacturer of DBS device: [employer] - no bluetooth  Total time spent programming was 30 minutes.  Device was confirmed to be on.  Soft start was confirmed to be on.  Impedences were checked and were within normal limits.  Battery was checked and was determined to be functioning normally and not near the end of life.  Final settings were as follows, with program 2 being active:   Active Contacts Amplitude (mA) PW (ms) Frequency (hz)   Left Brain       Program 1       11/28/2019 5-C+ 2.5 60 130   04/25/20 5-C+ 2.7 60 130   10/25/20 5-C+ 3.5(2.1-3.8) 60 130                               Program 2       11/28/19 5-8+ 2.2 60 130   04/25/20 5-8+ 3.2 60 130                         10/25/20 5-8+ 3.6 (3.0-4.0) 70 130   03/26/21 5-8+ 4.1 (3.0-4.4) 90 130   09/26/21 2-(40%)6-(60%)8+ 3.6 90 154 Higher frequency with tongue paresthesia  11/11/21 6-(50%)7-(50%)8+ 3.8 100 170   01/28/22 3-(50%)6-(50%)8+ 4.0 80 170 Mild lip paresthesia  04/14/22 3-(50%)6-(50%)8+ 4.1 90 179   10/20/22 3-(50%)6-(50%)8+ 4.5 90 179                        Right Brain       Not active                        Prior visits:  Monopolar review: Left brain electrode:     1-C+           ; Amplitude  1.0   ma   ; Pulse width 60 microseconds;   Frequency   130   Hz.  (Tongue paresthesias) Left brain electrode:     (2-3-4-) 33% each C+           ; Amplitude  2.0   ma   ; Pulse width 60 microseconds;   Frequency   130   Hz.  (Tongue and lip paresthesias, fairly good tremor control) Left brain electrode:     (5-6-7-) 33% C+           ; Amplitude  2.0   ma   ; Pulse width 60 microseconds;   Frequency   130   Hz.  (Tongue paresthesias but no lip paresthesia and good tremor control) Left brain electrode:     8-C+           ; Amplitude  1.0   ma   ; Pulse width 60 microseconds;   Frequency   130   Hz.  (Paresthesias resolved)

## 2022-10-21 ENCOUNTER — Ambulatory Visit: Payer: Medicare PPO | Admitting: Podiatry

## 2022-10-23 ENCOUNTER — Ambulatory Visit: Payer: Medicare PPO | Admitting: Podiatry

## 2022-10-23 DIAGNOSIS — L97522 Non-pressure chronic ulcer of other part of left foot with fat layer exposed: Secondary | ICD-10-CM

## 2022-10-23 MED ORDER — DOXYCYCLINE HYCLATE 100 MG PO TABS: 100.0000 mg | ORAL_TABLET | Freq: Two times a day (BID) | ORAL | 0 refills | Status: DC

## 2022-10-23 NOTE — Patient Instructions (Addendum)
Instructions for Wound Care  The most important step to healing a foot wound is to reduce the pressure on your foot - it is extremely important to stay off your foot as much as possible and wear the shoe/boot as instructed. Wear the surgical shoe.   Cleanse your foot with saline.  Blot dry.  Apply Medihoney to your wound and cover with gauze and a bandage.  May hold bandage in place with Coban (self sticky wrap), Ace bandage or tape.  You may find dressing supplies at your local [employer], [employer], drug store or medical supply store.  Monitor for any signs/symptoms of infection. If there is any increase in redness, red streaks, increase in drainage, warmth to your foot please give [place] a call. Also, if you start to run a fever or have "flu-like" symptoms that can also be a sign of infection. Call the office immediately if any occur or go directly to the emergency room.   If you have any questions, please feel free to give [place] a call at [phone] or if you are on "MyChart" you can always send me a message if needed.

## 2022-10-23 NOTE — Progress Notes (Signed)
   Subjective:  Chief Complaint  Patient presents with   Foot Ulcer    Left foot ulcer, patient is having some bleeding and drainage, patient denies any pain     71 y.o. male with PMHx of DM type II presenting for evaluation of a wound on the bottom of his left foot.  He was last seen by Dr. Amalia Hailey on September 24, 2022 for the same issue.  Seem to the wound is worsening.  He has not seen any purulence but he does note bloody drainage on the sock.  Denies any fevers or chills.        Objective/Physical Exam General: The patient is alert and oriented x3 in no acute distress.  Dermatology:  Picture below was after debridement wound.  There was a central granular wound measuring about 1 x 1 cm but there was some blister formation around the periphery of this.  After I debrided this the wound measured 2 x 2 x 0.2 cm with a granular base.  There is no probing to bone, amount or tunneling.  There is no fluctuance or crepitation.  There is no significant malodor present.     Vascular: Skin is warm to touch.  Palpable pedal pulses bilaterally. No edema or erythema noted.  Capillary refill immediate.  Clinically no concern for vascular compromise  Neurological: Light touch and protective threshold diminished bilaterally.   Musculoskeletal Exam: No amputations.  No gross deformity.  Assessment: 1.  Ulcer plantar aspect of the first MTP left secondary to diabetes mellitus 2. diabetes mellitus w/ peripheral neuropathy   Plan of Care:  -Treatment options discussed including all alternatives, risks, and complications -Etiology of symptoms were discussed -Medically necessary excisional debridement including subcutaneous tissue was performed using a tissue nipper and a chisel blade. Excisional debridement of all the necrotic nonviable tissue down to healthy bleeding viable tissue was performed and the pre and post wound measurements are noted above.  Betadine was applied followed by dressing.   Continue with daily dressing changes at home.  There is no significant blood loss during the procedure and hemostasis was achieved through manual compression. -Doxycycline -Offloading.  Has surgical shoe at home.  Trula Slade DPM

## 2022-11-02 DIAGNOSIS — E1121 Type 2 diabetes mellitus with diabetic nephropathy: Secondary | ICD-10-CM | POA: Diagnosis not present

## 2022-11-07 DIAGNOSIS — E785 Hyperlipidemia, unspecified: Secondary | ICD-10-CM | POA: Diagnosis not present

## 2022-11-12 ENCOUNTER — Telehealth: Payer: Self-pay | Admitting: Anesthesiology

## 2022-11-12 NOTE — Telephone Encounter (Signed)
[place] they told me they have been unable to reach patient and closed referral she is able  to reopen referral and patient has the number for scheduling

## 2022-11-12 NOTE — Telephone Encounter (Signed)
Pt called stating that on his last visit with Dr Tat she was going to refer him to a doctor at [facility] he does not recall the name. States Dr Tat told him to give [place] a call and follow up if he had not heard from them by Christmas. He has not heard from them yet.

## 2022-11-13 ENCOUNTER — Ambulatory Visit: Payer: Medicare PPO | Admitting: Podiatry

## 2022-11-13 VITALS — BP 158/89

## 2022-11-13 DIAGNOSIS — L97529 Non-pressure chronic ulcer of other part of left foot with unspecified severity: Secondary | ICD-10-CM | POA: Diagnosis not present

## 2022-11-13 DIAGNOSIS — L97522 Non-pressure chronic ulcer of other part of left foot with fat layer exposed: Secondary | ICD-10-CM

## 2022-11-13 DIAGNOSIS — E11621 Type 2 diabetes mellitus with foot ulcer: Secondary | ICD-10-CM | POA: Diagnosis not present

## 2022-11-13 NOTE — Progress Notes (Signed)
   Subjective:  Chief Complaint  Patient presents with   Diabetic Ulcer    Rm 12 Follow up left foot ulcer. Pt states he is concern with the constant bleeding he has had in the past week.     71 y.o. male with PMHx of DM type II presenting for evaluation of a wound on the bottom of his left foot.  He has not been able to wear the boot given his tremors he is having issues with this.  He got new shoes, inserts.  Denies any fevers or chills but he is concerned about the wound.     Objective/Physical Exam General: The patient is alert and oriented x3 in no acute distress.  Dermatology:  Picture below was after debridement wound.  Hyperkeratotic tissue present submetatarsal 1 and not able to visualize the wound but there is dried blood.  After I debrided the wound today it measured 2.5 x 1.5 x 0.2 cm.  There is no probing to bone, and or tunneling.  There is no fluctuation or crepitation.  No malodor.  No surrounding erythema or any obvious signs of infection noted today.     Vascular: Skin is warm to touch.  Palpable pedal pulses bilaterally. No edema or erythema noted.  Capillary refill immediate.  Clinically no concern for vascular compromise  Neurological: Light touch and protective threshold diminished bilaterally.   Musculoskeletal Exam: No amputations.  No gross deformity.  Assessment: 1.  Ulcer plantar aspect of the first MTP left secondary to diabetes mellitus 2. Diabetes mellitus w/ peripheral neuropathy   Plan of Care:  -Treatment options discussed including all alternatives, risks, and complications -Etiology of symptoms were discussed -Medically necessary excisional debridement including subcutaneous tissue was performed using a tissue nipper and a chisel blade. Excisional debridement of all the necrotic nonviable tissue down to healthy bleeding viable tissue was performed and the pre and post wound measurements are noted above.  Betadine was applied followed by dressing.   Continue with daily dressing changes at home.  There is no significant blood loss during the procedure and hemostasis was achieved through manual compression.  Dressing supplies ordered through [employer] for silver alginate. -I modified his insert to help take pressure off.  I also discussed with him that his wife could help him apply the boot would be beneficial to help with further offloading. -Monitor for any clinical signs or symptoms of infection and directed to call the office immediately should any occur or go to the ER.  Trula Slade DPM

## 2022-11-14 DIAGNOSIS — H524 Presbyopia: Secondary | ICD-10-CM | POA: Diagnosis not present

## 2022-11-14 DIAGNOSIS — H40013 Open angle with borderline findings, low risk, bilateral: Secondary | ICD-10-CM | POA: Diagnosis not present

## 2022-11-19 ENCOUNTER — Telehealth: Payer: Self-pay | Admitting: Neurology

## 2022-11-19 NOTE — Telephone Encounter (Signed)
Patient states that we did a referral for [facility] and he has appt with them in April and he wants to know if he should be seen by Tat sooner than that or is it ok to wait until April   Patient is aware that Tat is out of the office until next week he states a call back when she comes back is fine

## 2022-11-20 ENCOUNTER — Other Ambulatory Visit: Payer: Self-pay | Admitting: Cardiology

## 2022-11-20 DIAGNOSIS — I2699 Other pulmonary embolism without acute cor pulmonale: Secondary | ICD-10-CM

## 2022-11-20 DIAGNOSIS — I639 Cerebral infarction, unspecified: Secondary | ICD-10-CM

## 2022-11-20 NOTE — Telephone Encounter (Signed)
Called pateint and let him know he could wait on his appointment

## 2022-11-20 NOTE — Telephone Encounter (Signed)
Prescription refill request for Eliquis received.  Indication:cva, PE Last office visit: [place], 07/30/2022 Scr: 1.71, 07/30/2022 Age: 71 Weight: 117.5kg  Refill sent.

## 2022-12-03 DIAGNOSIS — E1121 Type 2 diabetes mellitus with diabetic nephropathy: Secondary | ICD-10-CM | POA: Diagnosis not present

## 2022-12-05 ENCOUNTER — Ambulatory Visit: Payer: Medicare PPO | Admitting: Podiatry

## 2022-12-05 ENCOUNTER — Ambulatory Visit (INDEPENDENT_AMBULATORY_CARE_PROVIDER_SITE_OTHER): Payer: Medicare PPO

## 2022-12-05 DIAGNOSIS — L97522 Non-pressure chronic ulcer of other part of left foot with fat layer exposed: Secondary | ICD-10-CM | POA: Diagnosis not present

## 2022-12-05 DIAGNOSIS — M779 Enthesopathy, unspecified: Secondary | ICD-10-CM

## 2022-12-05 MED ORDER — CEPHALEXIN 500 MG PO CAPS: 500.0000 mg | ORAL_CAPSULE | Freq: Three times a day (TID) | ORAL | 0 refills | Status: DC

## 2022-12-05 NOTE — Progress Notes (Unsigned)
   Subjective:  Chief Complaint  Patient presents with   Foot Problem    2 week ulcer check, left foot, no drainage   72 year old male presents the office today with above concerns.  He is concerned that the wound is still present.  Some bloody drainage in the reports but denies any fevers or chills.  No present swelling or redness.    Objective/Physical Exam General: The patient is alert and oriented x3 in no acute distress.  Dermatology:  Picture below was after debridement wound.  Hyperkeratotic tissue present submetatarsal 1 and not able to visualize the wound but there is dried blood.  After I debrided the wound today it measured 1 x 1 x 0.2 cm.  Wound is smaller.  Pre and post wound measurements are the same.  There is no probing to bone, and or tunneling.  There is no fluctuation or crepitation.  No malodor.  No surrounding erythema or any obvious signs of infection noted today.       Vascular: Skin is warm to touch.  Palpable pedal pulses bilaterally. No edema or erythema noted.  Capillary refill immediate.  Clinically no concern for vascular compromise  Neurological: Light touch and protective threshold diminished bilaterally.   Musculoskeletal Exam: No amputations.  No gross deformity.  Assessment: 1.  Ulcer plantar aspect of the first MTP left secondary to diabetes mellitus 2. Diabetes mellitus w/ peripheral neuropathy   Plan of Care:  -Treatment options discussed including all alternatives, risks, and complications -Etiology of symptoms were discussed -X-rays were obtained reviewed of the left foot.  3 views were obtained.  No definitive cortical destruction suggestive of osteomyelitis at this time.  No soft tissue edema.  Arthritic changes present the second MPJ. -Medically necessary excisional debridement including subcutaneous tissue was performed using a tissue nipper and a 312 chisel blade. Excisional debridement of all the necrotic nonviable tissue down to healthy  bleeding viable tissue was performed and the pre and post wound measurements are noted above.  Betadine was applied followed by dressing.  Continue with daily dressing changes at home with a silver dressing.  There is no significant blood loss during the procedure and hemostasis was achieved through manual compression.   -Continue offloading. -Monitor for any clinical signs or symptoms of infection and directed to call the office immediately should any occur or go to the ER.  Trula Slade DPM

## 2022-12-19 ENCOUNTER — Ambulatory Visit: Payer: Medicare PPO | Admitting: Podiatry

## 2022-12-19 DIAGNOSIS — L97529 Non-pressure chronic ulcer of other part of left foot with unspecified severity: Secondary | ICD-10-CM

## 2022-12-19 DIAGNOSIS — L97522 Non-pressure chronic ulcer of other part of left foot with fat layer exposed: Secondary | ICD-10-CM | POA: Diagnosis not present

## 2022-12-19 DIAGNOSIS — E11621 Type 2 diabetes mellitus with foot ulcer: Secondary | ICD-10-CM

## 2022-12-22 NOTE — Progress Notes (Signed)
   Subjective:  Chief Complaint  Patient presents with   Foot Ulcer    Left foot, 6 weeks, follow up      72 year old male presents the office with above concerns.  He states that he is doing better.  His wife has been changing the dressing over the collagen silver dressing.  No drainage or pus reports.  No increase in swelling or redness.  No fevers or chills.   Objective/Physical Exam General: The patient is alert and oriented x3 in no acute distress.  Dermatology:  Picture below was after debridement wound.  Hyperkeratotic tissue present submetatarsal 1 and not able to visualize the wound but there is dried blood.  After I debrided the wound today it measured 0.7 x 0.5 x 0.1 cm.  Prior to debridement it was slightly smaller 0.6 x 0.4 x 0.1 cm and hyperkeratotic periwound.  See pictures below.  Pre and post wound measurements are the same.  There is no probing to bone, and or tunneling.  There is no fluctuation or crepitation.  No malodor.  No surrounding erythema or any obvious signs of infection noted today.          Vascular: Skin is warm to touch.  Palpable pedal pulses bilaterally. No edema or erythema noted.  Capillary refill immediate.  Clinically no concern for vascular compromise  Neurological: Light touch and protective threshold diminished bilaterally.   Musculoskeletal Exam: No amputations.  No gross deformity.  Assessment: Ulcer plantar aspect of the first MTP left secondary to diabetes mellitus   Plan of Care:  -Treatment options discussed including all alternatives, risks, and complications -Etiology of symptoms were discussed -Medically necessary excisional debridement including subcutaneous tissue was performed using a tissue nipper and a 312 chisel blade. Excisional debridement of all the necrotic nonviable tissue down to healthy bleeding viable tissue was performed and the pre and post wound measurements are noted above.  Silvadene was applied followed by  dressing.  Continue with daily dressing changes at home with a silver dressing.  There is no significant blood loss during the procedure and hemostasis was achieved through manual compression.   -Continue offloading. -Monitor for any clinical signs or symptoms of infection and directed to call the office immediately should any occur or go to the ER.  Trula Slade DPM

## 2022-12-30 DIAGNOSIS — E1121 Type 2 diabetes mellitus with diabetic nephropathy: Secondary | ICD-10-CM | POA: Diagnosis not present

## 2022-12-30 DIAGNOSIS — E785 Hyperlipidemia, unspecified: Secondary | ICD-10-CM | POA: Diagnosis not present

## 2022-12-30 DIAGNOSIS — I129 Hypertensive chronic kidney disease with stage 1 through stage 4 chronic kidney disease, or unspecified chronic kidney disease: Secondary | ICD-10-CM | POA: Diagnosis not present

## 2022-12-30 DIAGNOSIS — Z794 Long term (current) use of insulin: Secondary | ICD-10-CM | POA: Diagnosis not present

## 2022-12-30 DIAGNOSIS — N1831 Chronic kidney disease, stage 3a: Secondary | ICD-10-CM | POA: Diagnosis not present

## 2023-01-02 ENCOUNTER — Telehealth: Payer: Self-pay | Admitting: *Deleted

## 2023-01-02 DIAGNOSIS — L97519 Non-pressure chronic ulcer of other part of right foot with unspecified severity: Secondary | ICD-10-CM | POA: Diagnosis not present

## 2023-01-02 NOTE — Telephone Encounter (Signed)
Patient is calling to request another order for the  sorbalgon ag-silver-(silva alginate) , please advise

## 2023-01-03 DIAGNOSIS — E1121 Type 2 diabetes mellitus with diabetic nephropathy: Secondary | ICD-10-CM | POA: Diagnosis not present

## 2023-01-09 ENCOUNTER — Ambulatory Visit: Payer: Medicare PPO | Admitting: Podiatry

## 2023-01-09 DIAGNOSIS — E11621 Type 2 diabetes mellitus with foot ulcer: Secondary | ICD-10-CM | POA: Diagnosis not present

## 2023-01-09 DIAGNOSIS — L97522 Non-pressure chronic ulcer of other part of left foot with fat layer exposed: Secondary | ICD-10-CM | POA: Diagnosis not present

## 2023-01-09 DIAGNOSIS — L97529 Non-pressure chronic ulcer of other part of left foot with unspecified severity: Secondary | ICD-10-CM | POA: Diagnosis not present

## 2023-01-09 NOTE — Progress Notes (Signed)
   Chief Complaint  Patient presents with   Foot Ulcer    Left foot ulcer      72 year old male presents the office with above concerns. He is getting frustrated with the wound not healing.  His wife is continue with dressing changes daily.  Did not report any drainage or pus.  Denies any fevers or chills.  There is no increased swelling and no redness or warmth.  He is not able to wear the surgical shoe given his tremors he cannot get on.    Objective/Physical Exam General: The patient is alert and oriented x3 in no acute distress.  Dermatology:  Picture below was after debridement wound.  Hyperkeratotic tissue present submetatarsal 1 and not able to visualize the wound but there is dried blood.  After I debrided the wound today it measured a little larger at 0.8 x 0.8 x 0.2 cm.  Prior debridement not able to measure the wound as it was covered with callus, wound products.  There is no probing, and or tunneling.  There is no surrounding erythema, ascending cellulitis.  No fluctuance or crepitation but there is no malodor.   Vascular: Skin is warm to touch.  Palpable pedal pulses bilaterally. No edema or erythema noted.  Capillary refill immediate.  Clinically no concern for vascular compromise  Neurological: Light touch and protective threshold diminished bilaterally.   Musculoskeletal Exam: No amputations.  No gross deformity.  Assessment: Ulcer plantar aspect of the first MTP left secondary to diabetes mellitus   Plan of Care:  -Treatment options discussed including all alternatives, risks, and complications -Etiology of symptoms were discussed -Medically necessary excisional debridement including subcutaneous tissue was performed using a tissue nipper and a 312 chisel blade. Excisional debridement of all the necrotic nonviable tissue down to healthy bleeding viable tissue was performed and the pre and post wound measurements are noted above.  Silvadene was applied followed by  dressing.  Continue with daily dressing changes at home with Medihoney and.  There is no significant blood loss during the procedure and hemostasis was achieved through manual compression.   -Continue offloading. -Given the wound has not healed a referral to the wound care center as well. -I have added additional padding to his shoes to help offload. -Monitor for any clinical signs or symptoms of infection and directed to call the office immediately should any occur or go to the ER.  Trula Slade DPM

## 2023-01-14 ENCOUNTER — Encounter: Payer: Self-pay | Admitting: Podiatry

## 2023-01-22 ENCOUNTER — Encounter (HOSPITAL_BASED_OUTPATIENT_CLINIC_OR_DEPARTMENT_OTHER): Payer: Medicare PPO | Attending: Internal Medicine | Admitting: Internal Medicine

## 2023-01-22 DIAGNOSIS — Z87891 Personal history of nicotine dependence: Secondary | ICD-10-CM | POA: Insufficient documentation

## 2023-01-22 DIAGNOSIS — E1142 Type 2 diabetes mellitus with diabetic polyneuropathy: Secondary | ICD-10-CM | POA: Insufficient documentation

## 2023-01-22 DIAGNOSIS — L97522 Non-pressure chronic ulcer of other part of left foot with fat layer exposed: Secondary | ICD-10-CM | POA: Insufficient documentation

## 2023-01-22 DIAGNOSIS — G250 Essential tremor: Secondary | ICD-10-CM | POA: Insufficient documentation

## 2023-01-22 DIAGNOSIS — Z794 Long term (current) use of insulin: Secondary | ICD-10-CM | POA: Insufficient documentation

## 2023-01-22 DIAGNOSIS — E11621 Type 2 diabetes mellitus with foot ulcer: Secondary | ICD-10-CM | POA: Diagnosis not present

## 2023-01-22 DIAGNOSIS — G4733 Obstructive sleep apnea (adult) (pediatric): Secondary | ICD-10-CM | POA: Diagnosis not present

## 2023-01-23 ENCOUNTER — Encounter (HOSPITAL_COMMUNITY): Payer: Self-pay

## 2023-01-23 ENCOUNTER — Emergency Department (HOSPITAL_COMMUNITY): Payer: Medicare PPO

## 2023-01-23 ENCOUNTER — Emergency Department (HOSPITAL_COMMUNITY)
Admission: EM | Admit: 2023-01-23 | Discharge: 2023-01-23 | Disposition: A | Discharge status: Home / Self Care | Payer: Medicare PPO | Attending: Student | Admitting: Student

## 2023-01-23 ENCOUNTER — Other Ambulatory Visit: Payer: Self-pay

## 2023-01-23 ENCOUNTER — Telehealth: Payer: Self-pay

## 2023-01-23 DIAGNOSIS — E1140 Type 2 diabetes mellitus with diabetic neuropathy, unspecified: Secondary | ICD-10-CM | POA: Diagnosis not present

## 2023-01-23 DIAGNOSIS — I129 Hypertensive chronic kidney disease with stage 1 through stage 4 chronic kidney disease, or unspecified chronic kidney disease: Secondary | ICD-10-CM | POA: Diagnosis not present

## 2023-01-23 DIAGNOSIS — Z7901 Long term (current) use of anticoagulants: Secondary | ICD-10-CM | POA: Insufficient documentation

## 2023-01-23 DIAGNOSIS — Z7982 Long term (current) use of aspirin: Secondary | ICD-10-CM | POA: Insufficient documentation

## 2023-01-23 DIAGNOSIS — Z7984 Long term (current) use of oral hypoglycemic drugs: Secondary | ICD-10-CM | POA: Diagnosis not present

## 2023-01-23 DIAGNOSIS — R202 Paresthesia of skin: Secondary | ICD-10-CM

## 2023-01-23 DIAGNOSIS — Z87891 Personal history of nicotine dependence: Secondary | ICD-10-CM | POA: Insufficient documentation

## 2023-01-23 DIAGNOSIS — L97509 Non-pressure chronic ulcer of other part of unspecified foot with unspecified severity: Secondary | ICD-10-CM | POA: Diagnosis not present

## 2023-01-23 DIAGNOSIS — E1122 Type 2 diabetes mellitus with diabetic chronic kidney disease: Secondary | ICD-10-CM | POA: Insufficient documentation

## 2023-01-23 DIAGNOSIS — E11621 Type 2 diabetes mellitus with foot ulcer: Secondary | ICD-10-CM | POA: Insufficient documentation

## 2023-01-23 DIAGNOSIS — Z96652 Presence of left artificial knee joint: Secondary | ICD-10-CM | POA: Diagnosis not present

## 2023-01-23 DIAGNOSIS — N1831 Chronic kidney disease, stage 3a: Secondary | ICD-10-CM | POA: Insufficient documentation

## 2023-01-23 DIAGNOSIS — N179 Acute kidney failure, unspecified: Secondary | ICD-10-CM | POA: Insufficient documentation

## 2023-01-23 DIAGNOSIS — R457 State of emotional shock and stress, unspecified: Secondary | ICD-10-CM | POA: Diagnosis not present

## 2023-01-23 DIAGNOSIS — Z794 Long term (current) use of insulin: Secondary | ICD-10-CM | POA: Insufficient documentation

## 2023-01-23 DIAGNOSIS — I10 Essential (primary) hypertension: Secondary | ICD-10-CM | POA: Diagnosis not present

## 2023-01-23 DIAGNOSIS — Z79899 Other long term (current) drug therapy: Secondary | ICD-10-CM | POA: Insufficient documentation

## 2023-01-23 LAB — COMPREHENSIVE METABOLIC PANEL
ALT: 19 U/L (ref 0–44)
AST: 18 U/L (ref 15–41)
Albumin: 3.3 g/dL — ABNORMAL LOW (ref 3.5–5.0)
Alkaline Phosphatase: 81 U/L (ref 38–126)
Anion gap: 13 (ref 5–15)
BUN: 32 mg/dL — ABNORMAL HIGH (ref 8–23)
CO2: 21 mmol/L — ABNORMAL LOW (ref 22–32)
Calcium: 9 mg/dL (ref 8.9–10.3)
Chloride: 107 mmol/L (ref 98–111)
Creatinine, Ser: 2 mg/dL — ABNORMAL HIGH (ref 0.61–1.24)
GFR, Estimated: 35 mL/min — ABNORMAL LOW (ref >60)
Glucose, Bld: 128 mg/dL — ABNORMAL HIGH (ref 70–99)
Potassium: 4 mmol/L (ref 3.5–5.1)
Sodium: 141 mmol/L (ref 135–145)
Total Bilirubin: 0.3 mg/dL (ref 0.3–1.2)
Total Protein: 6.7 g/dL (ref 6.5–8.1)

## 2023-01-23 LAB — CBC WITH DIFFERENTIAL/PLATELET
Abs Immature Granulocytes: 0.04 10*3/uL (ref 0.00–0.07)
Basophils Absolute: 0.1 10*3/uL (ref 0.0–0.1)
Basophils Relative: 1 %
Eosinophils Absolute: 0.5 10*3/uL (ref 0.0–0.5)
Eosinophils Relative: 5 %
HCT: 37.1 % — ABNORMAL LOW (ref 39.0–52.0)
Hemoglobin: 12.1 g/dL — ABNORMAL LOW (ref 13.0–17.0)
Immature Granulocytes: 1 %
Lymphocytes Relative: 45 %
Lymphs Abs: 3.9 10*3/uL (ref 0.7–4.0)
MCH: 29.7 pg (ref 26.0–34.0)
MCHC: 32.6 g/dL (ref 30.0–36.0)
MCV: 90.9 fL (ref 80.0–100.0)
Monocytes Absolute: 0.6 10*3/uL (ref 0.1–1.0)
Monocytes Relative: 7 %
Neutro Abs: 3.5 10*3/uL (ref 1.7–7.7)
Neutrophils Relative %: 41 %
Platelets: 264 10*3/uL (ref 150–400)
RBC: 4.08 MIL/uL — ABNORMAL LOW (ref 4.22–5.81)
RDW: 14.1 % (ref 11.5–15.5)
WBC: 8.5 10*3/uL (ref 4.0–10.5)
nRBC: 0 % (ref 0.0–0.2)

## 2023-01-23 LAB — CBG MONITORING, ED: Glucose-Capillary: 232 mg/dL — ABNORMAL HIGH (ref 70–99)

## 2023-01-23 NOTE — Telephone Encounter (Signed)
Patient states that he just walked in from running errands, has an implant. R forearm is tingling, R cheek/mouth are tingling (similar to when turning up/down the implant) and is hearing a beeping sound. Wondered if the implant is going dead or if he needs to be seen.

## 2023-01-23 NOTE — ED Triage Notes (Addendum)
PER EMS: pt reports sudden (2pm) onset of tingling to his right arm and his right cheek that lasted about 5 minutes. Hx of stroke in 2022 with deficits of right sided weakness. Pt A&OX4 has no complaints at this time.   BP-184/94, HR-82, CBG-205

## 2023-01-23 NOTE — Telephone Encounter (Signed)
Called Joshua [name] and with his history of stroke Dr. Carles Collet is recommending he go to the ED right away. Patient said he is on his way

## 2023-01-23 NOTE — ED Notes (Signed)
Failed x2 collecting labs

## 2023-01-24 NOTE — Progress Notes (Signed)
Joshua, MABERY [name] ([id number]) [id number] Page 1 of 9 Visit Report for 01/22/2023 Chief Complaint Document Details Patient Name: Date of Service: Joshua [name] 01/22/2023 8:00 [name] Medical Record Number: [id number] Patient Account Number: [id number] Date of Birth/Sex: Treating RN: 1951-04-23 (72 y.o. M) Primary Care Provider: [name] Other Clinician: Referring Provider: Treating Provider/Extender: Clemon Chambers in Treatment: 0 Information Obtained from: Patient Chief Complaint 01/22/2023; left plantar diabetic foot ulcer Electronic Signature(s) Signed: 01/22/2023 12:51:55 PM By: Kalman Shan DO Entered By: Kalman Shan on 01/22/2023 09:16:25 -------------------------------------------------------------------------------- Debridement Details Patient Name: Date of Service: Joshua Mane [name]. 01/22/2023 8:00 [name] Medical Record Number: [id number] Patient Account Number: [id number] Date of Birth/Sex: Treating RN: February 16, 1951 (73 y.o. [name], [name] Primary Care Provider: [name] Other Clinician: Referring Provider: Treating Provider/Extender: Clemon Chambers in Treatment: 0 Debridement Performed for Assessment: Wound #4 Left Foot Performed By: Physician Kalman Shan, DO Debridement Type: Debridement Severity of Tissue Pre Debridement: Fat layer exposed Level of Consciousness (Pre-procedure): Awake and Alert Pre-procedure Verification/Time Out Yes - 09:00 Taken: Start Time: 09:00 Pain Control: Lidocaine T Area Debrided (L x W): otal 0.5 (cm) x 0.5 (cm) = 0.25 (cm) Tissue and other material debrided: Viable, Non-Viable, Callus, Slough, Subcutaneous, Slough Level: Skin/Subcutaneous Tissue Debridement Description: Excisional Instrument: Curette Bleeding: Minimum Hemostasis Achieved: Pressure End Time: 09:00 Procedural Pain: 0 Post Procedural Pain: 0 Response to Treatment:  Procedure was tolerated well Level of Consciousness (Post- Awake and Alert procedure): Post Debridement Measurements of Total Wound Length: (cm) 0.5 Width: (cm) 0.5 Depth: (cm) 0.7 Volume: (cm) 0.137 Character of Wound/Ulcer Post Debridement: Improved Severity of Tissue Post Debridement: Fat layer exposed Post Procedure Diagnosis Same as Pre-procedure Electronic Signature(s) Signed: 01/22/2023 12:51:55 PM By: Kalman Shan DO Signed: 01/22/2023 4:19:23 PM By: Rhae Hammock RN Entered By: Rhae Hammock on 01/22/2023 09:05:30 [name] ([id number] Page 2 of 9 -------------------------------------------------------------------------------- HPI Details Patient Name: Date of Service: Joshua [name] 01/22/2023 8:00 [name] Medical Record Number: [id number] Patient Account Number: [id number] Date of Birth/Sex: Treating RN: 03/14/1951 (72 y.o. M) Primary Care Provider: [name] Other Clinician: Referring Provider: Treating Provider/Extender: Clemon Chambers in Treatment: 0 History of Present Illness Location: left foot was the initial wound and then he has one on the right foot Quality: Patient reports No Pain. Severity: Patient states wound(s) are getting worse. Duration: Patient has had the wound for > 10 months prior to seeking treatment at the wound center Context: The wound would happen gradually Modifying Factors: Patient wound(s)/ulcer(s) are worsening due to : nonhealing nature in spite of various medications and procedures by the podiatrist Dr. Earleen Newport HPI Description: 01/22/2023 [name] is [name] With [name] past medical history of insulin-dependent type 2 diabetes with last hemoglobin 123456 of 7.9 complicated by peripheral neuropathy, essential tremors, and OSA that presents the clinic for [name] 3-19-monthhistory of nonhealing ulcer to the plantar aspect of his left foot. He is not  sure how it started but over time has progressively become larger. He is not using any offloading device. He has been placing [name] Band-Aid with antibiotic ointment to the wound bed. He has been following with Dr. WEarleen Newportfor this issue, podiatry. He last had an x-ray on 12/05/2022 that showed no definitive cortical destruction suggesting osteomyelitis. He currently denies systemic signs of infection. Electronic Signature(s) Signed: 01/22/2023 12:51:55 PM By: HKalman Shan  DO Entered By: Kalman Shan on 01/22/2023 09:37:48 -------------------------------------------------------------------------------- Physical Exam Details Patient Name: Date of Service: Joshua [name] 01/22/2023 8:00 [name] Medical Record Number: [id number] Patient Account Number: [id number] Date of Birth/Sex: Treating RN: 07-02-1951 (72 y.o. M) Primary Care Provider: [name] Other Clinician: Referring Provider: Treating Provider/Extender: Clemon Chambers in Treatment: 0 Constitutional respirations regular, non-labored and within target range for patient.. Cardiovascular 2+ dorsalis pedis/posterior tibialis pulses. Psychiatric pleasant and cooperative. Notes Left foot: T the plantar/medial aspect there is an open wound with granulation tissue, nonviable tissue and devitalized tissue. Mild odor on exam. No increased o warmth, erythema or purulent drainage. Electronic Signature(s) Signed: 01/22/2023 12:51:55 PM By: Kalman Shan DO Entered By: Kalman Shan on 01/22/2023 09:38:49 -------------------------------------------------------------------------------- Physician Orders Details Patient Name: Date of Service: Joshua Mane [name]. 01/22/2023 8:00 [name] Medical Record Number: [id number] Patient Account Number: [id number] Date of Birth/Sex: Treating RN: 21-Jun-1951 (72 y.o. [name], [name] Primary Care Provider: [name] Other Clinician: Referring Provider: Treating  Provider/Extender: Clemon Chambers in Treatment: 0 Verbal / Phone Orders: No TORENCE, CRUZE ([id number]) [id number] Page 3 of 9 Diagnosis Coding Follow-up Appointments ppointment in 1 week. - w/ Dr. Heber Merrifield and Allayne Butcher Rm # 9 Thursday 01/29/23 @ 1:15 Return [name] Other: - Pick up antibioitics from your pharmacy ASAP!!! Anesthetic (In clinic) Topical Lidocaine 5% applied to wound bed Bathing/ Shower/ Hygiene May shower with protection but do not get wound dressing(s) wet. Protect dressing(s) with water repellant cover (for example, large plastic bag) or [name] cast cover and may then take shower. Edema Control - Lymphedema / SCD / Other Elevate legs to the level of the heart or above for 30 minutes daily and/or when sitting for 3-4 times [name] day throughout the day. Avoid standing for long periods of time. Off-Loading Open toe surgical shoe to: - w/ peg assist Other: - Keep pressure off bottom of foot- Wound Treatment Wound #4 - Foot Wound Laterality: Left Cleanser: Vashe 5.8 (oz) (DME) (Generic) 1 x Per Day/15 Days Discharge Instructions: Cleanse the wound with Vashe prior to applying [name] clean dressing using gauze sponges, not tissue or cotton balls. Prim Dressing: Hydrofera Blue Ready Transfer Foam, 4x5 (in/in) (DME) (Generic) 1 x Per Day/15 Days ary Discharge Instructions: Apply to wound bed as instructed Secondary Dressing: Optifoam Non-Adhesive Dressing, 4x4 in (DME) (Generic) 1 x Per Day/15 Days Discharge Instructions: Apply foam donut over primary dressing as directed. Secondary Dressing: Woven Gauze Sponge, Non-Sterile 4x4 in (DME) (Generic) 1 x Per Day/15 Days Discharge Instructions: Apply over primary dressing as directed. Secured With: [employer], 4.5x3.1 (in/yd) (DME) (Generic) 1 x Per Day/15 Days Discharge Instructions: Secure with Kerlix as directed. Secured With: 22M Medipore H Soft Cloth Surgical T ape, 4 x 10 (in/yd)  (DME) (Generic) 1 x Per Day/15 Days Discharge Instructions: Secure with tape as directed. Secured With: [employer] Size 5, 10 (yds) (DME) (Generic) 1 x Per Day/15 Days Patient Medications llergies: melatonin [name] Notifications Medication Indication Start End PRN debridements/pain3/11/2022 lidocaine DOSE topical 5 % gel - gel topical once daily 01/22/2023 amoxicillin-pot clavulanate DOSE 1 - oral 875 mg-125 mg tablet - 1 tablet oral twice [name] day x 10 days Electronic Signature(s) Signed: 01/22/2023 12:51:55 PM By: Kalman Shan DO Previous Signature: 01/22/2023 9:37:21 AM Version By: Kalman Shan DO Entered By: Kalman Shan on 01/22/2023 09:39:01 -------------------------------------------------------------------------------- Problem List Details Patient Name: Date of Service: Joshua Joshua [name]  N [name]. 01/22/2023 8:00 [name] Medical Record Number: [id number] Patient Account Number: [id number] Date of Birth/Sex: Treating RN: [birth year] (72 y.o. M) Primary Care Provider: [name] Other Clinician: Referring Provider: Treating Provider/Extender: Clemon Chambers in Treatment: 0 Active Problems ICD-10 SARIM, PROPER ([id number]) [id number] Page 4 of 9 Encounter Code Description Active Date MDM Diagnosis L97.522 Non-pressure chronic ulcer of other part of left foot with fat layer exposed 01/22/2023 No Yes E11.621 Type 2 diabetes mellitus with foot ulcer 01/22/2023 No Yes G25.0 Essential tremor 01/22/2023 No Yes Inactive Problems Resolved Problems Electronic Signature(s) Signed: 01/22/2023 12:51:55 PM By: Kalman Shan DO Entered By: Kalman Shan on 01/22/2023 09:14:28 -------------------------------------------------------------------------------- Progress Note Details Patient Name: Date of Service: Joshua Joshua Cheng [name]. 01/22/2023 8:00 [name] Medical Record Number: [id number] Patient Account Number: [id number] Date of Birth/Sex:  Treating RN: [birth year] (72 y.o. M) Primary Care Provider: [name] Other Clinician: Referring Provider: Treating Provider/Extender: Clemon Chambers in Treatment: 0 Subjective Chief Complaint Information obtained from Patient 01/22/2023; left plantar diabetic foot ulcer History of Present Illness (HPI) The following HPI elements were documented for the patient's wound: Location: left foot was the initial wound and then he has one on the right foot Quality: Patient reports No Pain. Severity: Patient states wound(s) are getting worse. Duration: Patient has had the wound for > 10 months prior to seeking treatment at the wound center Context: The wound would happen gradually Modifying Factors: Patient wound(s)/ulcer(s) are worsening due to : nonhealing nature in spite of various medications and procedures by the podiatrist Dr. Earleen Newport 01/22/2023 [name] is [name] With [name] past medical history of insulin-dependent type 2 diabetes with last hemoglobin 123456 of 7.9 complicated by peripheral neuropathy, essential tremors, and OSA that presents the clinic for [name] 3-90-monthhistory of nonhealing ulcer to the plantar aspect of his left foot. He is not sure how it started but over time has progressively become larger. He is not using any offloading device. He has been placing [name] Band-Aid with antibiotic ointment to the wound bed. He has been following with Dr. WEarleen Newportfor this issue, podiatry. He last had an x-ray on 12/05/2022 that showed no definitive cortical destruction suggesting osteomyelitis. He currently denies systemic signs of infection. Patient History Information obtained from Patient. Allergies melatonin Family History Diabetes - Maternal Grandparents. Social History Former smoker, Alcohol Use - Moderate - 2 drinks weekly, Drug Use - No History, Caffeine Use - Daily. Medical History Eyes Patient has history of Cataracts - extraction  bilat Denies history of Glaucoma, Optic Neuritis Ear/Nose/Mouth/Throat Denies history of Chronic sinus problems/congestion, Middle ear problems Hematologic/Lymphatic Denies history of Anemia, Hemophilia, Human Immunodeficiency Virus, Lymphedema, Sickle Cell Disease Respiratory GRAHIEM, KREGELA ([id number] Page 5 of 9 Denies history of Aspiration, Asthma, Chronic Obstructive Pulmonary Disease (COPD), Pneumothorax, Sleep Apnea, Tuberculosis Cardiovascular Patient has history of Hypertension Denies history of Angina, Arrhythmia, Congestive Heart Failure, Coronary Artery Disease, Deep Vein Thrombosis, Hypotension, Myocardial Infarction, Peripheral Arterial Disease, Peripheral Venous Disease, Phlebitis, Vasculitis Gastrointestinal Denies history of Cirrhosis , Colitis, Crohnoos, Hepatitis [name], Hepatitis B, Hepatitis C Endocrine Patient has history of Type II Diabetes Denies history of Type I Diabetes Genitourinary Denies history of End Stage Renal Disease Immunological Denies history of Lupus Erythematosus, Raynaudoos, Scleroderma Integumentary (Skin) Denies history of History of Burn Musculoskeletal Denies history of Gout, Rheumatoid Arthritis, Osteoarthritis, Osteomyelitis Neurologic Patient has history of Neuropathy Denies history of Dementia, Quadriplegia, Paraplegia, Seizure  Disorder Oncologic Denies history of Received Chemotherapy, Received Radiation Psychiatric Denies history of Anorexia/bulimia, Confinement Anxiety Hospitalization/Surgery History - appendectomy. - tonsillectomy. - vasectomy. - nasal septum surgery. Medical [name] Surgical History Notes nd Constitutional Symptoms (General Health) arthro knee surgery Cardiovascular h/o CVA , Neurologic central tremors Oncologic h/o melanoma Objective Constitutional respirations regular, non-labored and within target range for patient.. Vitals Time Taken: 8:00 AM, Respiratory Rate: 17  breaths/min. Cardiovascular 2+ dorsalis pedis/posterior tibialis pulses. Psychiatric pleasant and cooperative. General Notes: Left foot: T the plantar/medial aspect there is an open wound with granulation tissue, nonviable tissue and devitalized tissue. Mild odor on o exam. No increased warmth, erythema or purulent drainage. Integumentary (Hair, Skin) Wound #4 status is Open. Original cause of wound was Gradually Appeared. The date acquired was: 09/24/2022. The wound is located on the Left Foot. The wound measures 0.5cm length x 0.5cm width x 0.7cm depth; 0.196cm^2 area and 0.137cm^3 volume. There is Fat Layer (Subcutaneous Tissue) exposed. There is no tunneling noted, however, there is undermining starting at 12:00 and ending at 12:00 with [name] maximum distance of 0.7cm. There is [name] medium amount of serosanguineous drainage noted. Foul odor after cleansing was noted. The wound margin is distinct with the outline attached to the wound base. There is large (67-100%) red, pink granulation within the wound bed. There is [name] small (1-33%) amount of necrotic tissue within the wound bed including Adherent Slough. The periwound skin appearance exhibited: Callus. The periwound skin appearance did not exhibit: Crepitus, Excoriation, Induration, Rash, Scarring, Dry/Scaly, Maceration, Atrophie Blanche, Cyanosis, Ecchymosis, Hemosiderin Staining, Mottled, Pallor, Rubor, Erythema. Periwound temperature was noted as No Abnormality. The periwound has tenderness on palpation. Assessment Active Problems ICD-10 Non-pressure chronic ulcer of other part of left foot with fat layer exposed Type 2 diabetes mellitus with foot ulcer Essential tremor LUTHER, AWBREY [name] ([id number]) [id number] Page 6 of 9 Patient presents with [name] 44-monthhistory of nonhealing ulcer to the left plantar foot secondary to type 2 diabetes complicated by peripheral neuropathy. I debrided nonviable tissue. We had [name] long  discussion about the importance of aggressive offloading. For now we will start with [name] surgical shoe and peg assist. I recommended Hydrofera Blue for the dressing. Since there is an odor on exam and tissue does not appear healthy I recommended an oral antibiotic. Clean with Vashe. Follow-up in 1 week. Procedures Wound #4 Pre-procedure diagnosis of Wound #4 is [name] Diabetic Wound/Ulcer of the Lower Extremity located on the Left Foot .Severity of Tissue Pre Debridement is: Fat layer exposed. There was [name] Excisional Skin/Subcutaneous Tissue Debridement with [name] total area of 0.25 sq cm performed by HKalman Shan DO. With the following instrument(s): Curette to remove Viable and Non-Viable tissue/material. Material removed includes Callus, Subcutaneous Tissue, and Slough after achieving pain control using Lidocaine. No specimens were taken. [name] time out was conducted at 09:00, prior to the start of the procedure. [name] Minimum amount of bleeding was controlled with Pressure. The procedure was tolerated well with [name] pain level of 0 throughout and [name] pain level of 0 following the procedure. Post Debridement Measurements: 0.5cm length x 0.5cm width x 0.7cm depth; 0.137cm^3 volume. Character of Wound/Ulcer Post Debridement is improved. Severity of Tissue Post Debridement is: Fat layer exposed. Post procedure Diagnosis Wound #4: Same as Pre-Procedure Plan Follow-up Appointments: Return Appointment in 1 week. - w/ Dr. HHeber Carolinaand LAllayne ButcherRm # 9 Thursday 01/29/23 @ 1:15 Other: - Pick up antibioitics from your pharmacy ASAP!!! Anesthetic: (In clinic) Topical Lidocaine 5%  applied to wound bed Bathing/ Shower/ Hygiene: May shower with protection but do not get wound dressing(s) wet. Protect dressing(s) with water repellant cover (for example, large plastic bag) or [name] cast cover and may then take shower. Edema Control - Lymphedema / SCD / Other: Elevate legs to the level of the heart or above for 30 minutes daily and/or  when sitting for 3-4 times [name] day throughout the day. Avoid standing for long periods of time. Off-Loading: Open toe surgical shoe to: - w/ peg assist Other: - Keep pressure off bottom of foot- The following medication(s) was prescribed: lidocaine topical 5 % gel gel topical once daily for PRN debridements/pain was prescribed at facility amoxicillin-pot clavulanate oral 875 mg-125 mg tablet 1 1 tablet oral twice [name] day x 10 days starting 01/22/2023 WOUND #4: - Foot Wound Laterality: Left Cleanser: Vashe 5.8 (oz) (DME) (Generic) 1 x Per Day/15 Days Discharge Instructions: Cleanse the wound with Vashe prior to applying [name] clean dressing using gauze sponges, not tissue or cotton balls. Prim Dressing: Hydrofera Blue Ready Transfer Foam, 4x5 (in/in) (DME) (Generic) 1 x Per Day/15 Days ary Discharge Instructions: Apply to wound bed as instructed Secondary Dressing: Optifoam Non-Adhesive Dressing, 4x4 in (DME) (Generic) 1 x Per Day/15 Days Discharge Instructions: Apply foam donut over primary dressing as directed. Secondary Dressing: Woven Gauze Sponge, Non-Sterile 4x4 in (DME) (Generic) 1 x Per Day/15 Days Discharge Instructions: Apply over primary dressing as directed. Secured With: [employer], 4.5x3.1 (in/yd) (DME) (Generic) 1 x Per Day/15 Days Discharge Instructions: Secure with Kerlix as directed. Secured With: 19M Medipore H Soft Cloth Surgical T ape, 4 x 10 (in/yd) (DME) (Generic) 1 x Per Day/15 Days Discharge Instructions: Secure with tape as directed. Secured With: [employer] Size 5, 10 (yds) (DME) (Generic) 1 x Per Day/15 Days 1. In office sharp debridement 2 PuraPly 3. Vashe 4. Aggressive offloadingoosurgical shoe with peg assist 5. Follow-up in 1 week 6. Augmentin Electronic Signature(s) Signed: 01/22/2023 12:51:55 PM By: Kalman Shan DO Entered By: Kalman Shan on 01/22/2023  09:40:40 -------------------------------------------------------------------------------- HxROS Details Patient Name: Date of Service: Joshua Mane [name]. 01/22/2023 8:00 [name] Medical Record Number: [id number] Patient Account Number: [id number] Date of Birth/Sex: Treating RN: 12/02/1950 (72 y.o. [name], [name] Primary Care Provider: [name] Other Clinician: Referring Provider: Treating Provider/Extender: Micheaux, Matheson [name] ([id number]) [id number] Page 7 of 9 Weeks in Treatment: 0 Information Obtained From Patient Constitutional Symptoms (General Health) Medical History: Past Medical History Notes: arthro knee surgery Eyes Medical History: Positive for: Cataracts - extraction bilat Negative for: Glaucoma; Optic Neuritis Ear/Nose/Mouth/Throat Medical History: Negative for: Chronic sinus problems/congestion; Middle ear problems Hematologic/Lymphatic Medical History: Negative for: Anemia; Hemophilia; Human Immunodeficiency Virus; Lymphedema; Sickle Cell Disease Respiratory Medical History: Negative for: Aspiration; Asthma; Chronic Obstructive Pulmonary Disease (COPD); Pneumothorax; Sleep Apnea; Tuberculosis Cardiovascular Medical History: Positive for: Hypertension Negative for: Angina; Arrhythmia; Congestive Heart Failure; Coronary Artery Disease; Deep Vein Thrombosis; Hypotension; Myocardial Infarction; Peripheral Arterial Disease; Peripheral Venous Disease; Phlebitis; Vasculitis Past Medical History Notes: h/o CVA , Gastrointestinal Medical History: Negative for: Cirrhosis ; Colitis; Crohns; Hepatitis [name]; Hepatitis B; Hepatitis C Endocrine Medical History: Positive for: Type II Diabetes Negative for: Type I Diabetes Time with diabetes: since 2010 Treated with: Insulin Blood sugar tested every day: Yes Tested : daily Blood sugar testing results: Breakfast: 142 Genitourinary Medical History: Negative  for: End Stage Renal Disease Immunological Medical History: Negative for: Lupus Erythematosus; Raynauds; Scleroderma Integumentary (Skin) Medical  History: Negative for: History of Burn Musculoskeletal Medical History: Negative for: Gout; Rheumatoid Arthritis; Osteoarthritis; Osteomyelitis Neurologic DARCEY, BONIFACIO [name] ([id number]) [id number].pdf Page 8 of 9 Medical History: Positive for: Neuropathy Negative for: Dementia; Quadriplegia; Paraplegia; Seizure Disorder Past Medical History Notes: central tremors Oncologic Medical History: Negative for: Received Chemotherapy; Received Radiation Past Medical History Notes: h/o melanoma Psychiatric Medical History: Negative for: Anorexia/bulimia; Confinement Anxiety HBO Extended History Items Eyes: Cataracts Immunizations Pneumococcal Vaccine: Received Pneumococcal Vaccination: No Immunization Notes: unknown Implantable Devices No devices added Hospitalization / Surgery History Type of Hospitalization/Surgery appendectomy tonsillectomy vasectomy nasal septum surgery Family and Social History Diabetes: Yes - Maternal Grandparents; Former smoker; Alcohol Use: Moderate - 2 drinks weekly; Drug Use: No History; Caffeine Use: Daily; Financial Concerns: No; Food, Clothing or Shelter Needs: No; Support System Lacking: No; Transportation Concerns: No Electronic Signature(s) Signed: 01/22/2023 12:51:55 PM By: Kalman Shan DO Signed: 01/22/2023 4:19:23 PM By: Rhae Hammock RN Entered By: Rhae Hammock on 01/22/2023 08:08:21 -------------------------------------------------------------------------------- SuperBill Details Patient Name: Date of Service: Joshua Mane [name]. 01/22/2023 Medical Record Number: [id number] Patient Account Number: [id number] Date of Birth/Sex: Treating RN: [birth year] (72 y.o. [name], [name] Primary Care Provider: [name] Other Clinician: Referring  Provider: Treating Provider/Extender: Clemon Chambers in Treatment: 0 Diagnosis Coding ICD-10 Codes Code Description [phone] Non-pressure chronic ulcer of other part of left foot with fat layer exposed E11.621 Type 2 diabetes mellitus with foot ulcer G25.0 Essential tremor Facility Procedures : CPT4 Code: [id number] Description: [place] VISIT-LEV 3 EST PT Modifier: Quantity: 1 : ELIOTT, AMOR Code: [id number] NNON [name] ([id number]) ICD-10 E1 Description: [id number] - DEB SUBQ TISSUE 20 SQ CM/< [phone] Diagnosis Description 1.621 Type 2 diabetes mellitus with foot ulcer Modifier: [id number] Quantity: 1 64.pdf Page 9 of 9 Physician Procedures : CPT4 Code Description Modifier [id number] [phone] - WC PHYS LEVEL 4 - NEW PT ICD-10 Diagnosis Description L97.522 Non-pressure chronic ulcer of other part of left foot with fat layer exposed E11.621 Type 2 diabetes mellitus with foot ulcer G25.0 Essential  tremor Quantity: 1 : [id number] - WC PHYS SUBQ TISS 20 SQ CM ICD-10 Diagnosis Description E11.621 Type 2 diabetes mellitus with foot ulcer Quantity: 1 Electronic Signature(s) Signed: 01/22/2023 12:51:55 PM By: Kalman Shan DO Entered By: Kalman Shan on 01/22/2023 09:41:13

## 2023-01-24 NOTE — Progress Notes (Signed)
Joshua [name] ([id number]) [id number] Page 1 of 4 Visit Report for 01/22/2023 Abuse Risk Screen Details Patient Name: Date of Service: [name] 01/22/2023 8:00 [name] Medical Record Number: [id number] Patient Account Number: [id number] Date of Birth/Sex: Treating RN: 01-11-1951 (72 y.o. Joshua [name], [name] Primary Care Samanyu Tinnell: [name] Other Clinician: Referring Keana Dueitt: Treating Pasha Gadison/Extender: Clemon Chambers in Treatment: 0 Abuse Risk Screen Items Answer ABUSE RISK SCREEN: Has anyone close to you tried to hurt or harm you recentlyo No Do you feel uncomfortable with anyone in your familyo No Has anyone forced you do things that you didnt want to doo No Electronic Signature(s) Signed: 01/22/2023 4:19:23 PM By: Rhae Hammock RN Entered By: Rhae Hammock on 01/22/2023 08:08:30 -------------------------------------------------------------------------------- Activities of Daily Living Details Patient Name: Date of Service: [name]. 01/22/2023 8:00 [name] Medical Record Number: [id number] Patient Account Number: [id number] Date of Birth/Sex: Treating RN: [birth year] (72 y.o. Joshua [name], [name] Primary Care Idella Lamontagne: [name] Other Clinician: Referring Graeson Nouri: Treating Jakobi Thetford/Extender: Clemon Chambers in Treatment: 0 Activities of Daily Living Items Answer Activities of Daily Living (Please select one for each item) Drive Automobile Completely Able T Medications ake Completely Able Use T elephone Completely Able Care for Appearance Completely Able Use T oilet Completely Able Bath / Shower Completely Able Dress Self Completely Able Feed Self Completely Able Walk Completely Able Get In / Out Bed Completely Able Housework Completely Able Prepare Meals Completely Coos Completely Able Shop for Self Completely Able Electronic  Signature(s) Signed: 01/22/2023 4:19:23 PM By: Rhae Hammock RN Entered By: Rhae Hammock on 01/22/2023 08:08:50 -------------------------------------------------------------------------------- Education Screening Details Patient Name: Date of Service: Joshua [name]. 01/22/2023 8:00 [name] Medical Record Number: [id number] Patient Account Number: [id number] Date of Birth/Sex: Treating RN: 12-26-1950 (72 y.o. Joshua [name], [name] Primary Care Sueko Dimichele: [name] Other Clinician: Referring Seeley Hissong: Treating Merlene Dante/Extender: Clemon Chambers in Treatment: 0 [name], [name] ([id number]) [id number] Page 2 of 4 Primary Learner Assessed: Patient Learning Preferences/Education Level/Primary Language Learning Preference: Explanation, Demonstration, Communication Board, Printed Material Highest Education Level: [employer] or Above Preferred Language: Diplomatic Services operational officer Language Barrier: No Translator Needed: No Memory Deficit: No Emotional Barrier: No Cultural/Religious Beliefs Affecting Medical Care: No Physical Barrier Impaired Vision: Yes Glasses Impaired Hearing: No Decreased Hand dexterity: No Knowledge/Comprehension Knowledge Level: High Comprehension Level: High Ability to understand written instructions: High Ability to understand verbal instructions: High Motivation Anxiety Level: Calm Cooperation: Cooperative Education Importance: Denies Need Interest in Health Problems: Asks Questions Perception: Coherent Willingness to Engage in Self-Management High Activities: Readiness to Engage in Self-Management High Activities: Electronic Signature(s) Signed: 01/22/2023 4:19:23 PM By: Rhae Hammock RN Entered By: Rhae Hammock on 01/22/2023 08:09:52 -------------------------------------------------------------------------------- Fall Risk Assessment Details Patient Name: Date of Service: [name]. 01/22/2023 8:00 [name] Medical Record Number: [id number] Patient Account Number: [id number] Date of Birth/Sex: Treating RN: 1951/10/30 (72 y.o. Joshua [name], [name] Primary Care Jonet Mathies: [name] Other Clinician: Referring Dulse Rutan: Treating Seiya Silsby/Extender: Clemon Chambers in Treatment: 0 Fall Risk Assessment Items Have you had 2 or more falls in the last 12 monthso 0 Yes Have you had any fall that resulted in injury in the last 12 monthso 0 No FALLS RISK SCREEN History of falling - immediate or within 3 months 25 Yes Secondary diagnosis (Do you have 2 or more medical diagnoseso) 15 Yes  Ambulatory aid None/bed rest/wheelchair/nurse 0 No Crutches/cane/walker 15 Yes Furniture 0 No Intravenous therapy Access/Saline/Heparin Lock 0 No Gait/Transferring Normal/ bed rest/ wheelchair 0 No Weak (short steps with or without shuffle, stooped but able to lift head while walking, may seek 0 No support from furniture) Impaired (short steps with shuffle, may have difficulty arising from chair, head down, impaired 0 No balance) Mental Status Oriented to own ability 0 No Overestimates or forgets limitations 0 No Risk Level: High Risk Score: 55 Joshua [name], Joshua [name] ([id number]) [phone] [id number] Page 3 of 4 Electronic Signature(s) -------------------------------------------------------------------------------- Foot Assessment Details Patient Name: Date of Service: [name] 01/22/2023 8:00 [name] Medical Record Number: [id number] Patient Account Number: [id number] Date of Birth/Sex: Treating RN: 05-02-1951 (72 y.o. Joshua [name], [name] Primary Care Lashunta Frieden: [name] Other Clinician: Referring Maris Abascal: Treating Carmen Tolliver/Extender: Clemon Chambers in Treatment: 0 Foot Assessment Items Site Locations + = Sensation present, - = Sensation absent, C = Callus, U = Ulcer R = Redness, W = Warmth, M =  Maceration, PU = Pre-ulcerative lesion F = Fissure, S = Swelling, D = Dryness Assessment Right: Left: Other Deformity: No No Prior Foot Ulcer: No No Prior Amputation: No No Charcot Joint: No No Ambulatory Status: Ambulatory With Help Assistance Device: Walker Gait: Steady Electronic Signature(s) Signed: 01/22/2023 4:19:23 PM By: Rhae Hammock RN Entered By: Rhae Hammock on 01/22/2023 08:18:23 -------------------------------------------------------------------------------- Nutrition Risk Screening Details Patient Name: Date of Service: [name]. 01/22/2023 8:00 [name] Medical Record Number: [id number] Patient Account Number: [id number] Date of Birth/Sex: Treating RN: 02-06-1951 (72 y.o. Joshua [name], [name] Primary Care Mayla Biddy: [name] Other Clinician: Referring Javier Gell: Treating Nell Schrack/Extender: Clemon Chambers in Treatment: 0 Height (in): 76 Weight (lbs): 274 Body Mass Index (BMI): 33.3 Joshua [name], Joshua [name] ([id number]) [phone] [id number] Page 4 of 4 Nutrition Risk Screening Items Score Screening NUTRITION RISK SCREEN: I have an illness or condition that made me change the kind and/or amount of food I eat 0 No I eat fewer than two meals per day 0 No I eat few fruits and vegetables, or milk products 0 No I have three or more drinks of beer, liquor or wine almost every day 0 No I have tooth or mouth problems that make it hard for me to eat 0 No I don't always have enough money to buy the food I need 0 No I eat alone most of the time 0 No I take three or more different prescribed or over-the-counter drugs [name] day 0 No Without wanting to, I have lost or gained 10 pounds in the last six months 0 No I am not always physically able to shop, cook and/or feed myself 0 No Nutrition Protocols Good Risk Protocol 0 No interventions needed Moderate Risk Protocol High Risk Proctocol Risk Level: Good Risk Score:  0 Electronic Signature(s) Signed: 01/22/2023 4:19:23 PM By: Rhae Hammock RN Entered By: Rhae Hammock on 01/22/2023 08:09:11

## 2023-01-24 NOTE — ED Provider Notes (Signed)
[facility] Provider Note  CSN: [id number] Arrival date & time: 01/23/23 1633  Chief Complaint(s) Tingling  HPI Hue A Siddons is a 72 y.o. male with PMH previous CVAs, DVT, BPH, BPPV, hypertrophic cardiomyopathy, essential tremor status post VNS stimulator placement who presents emergency department for evaluation of paresthesias.  Patient states that at approximately 2 PM today he had a 5 to 10-minute episode of tingling in the right cheek and the right upper extremity.  This self resolved, but he then spoke with his neurologist who recommended that he come to the emergency department due to his previous high risk history of stroke.  Here in the emergency department, his he is completely asymptomatic and denies any numbness, tingling, weakness or any other neurologic complaints.  Denies chest pain, shortness of breath, abdominal pain, nausea, vomiting or other systemic symptoms.   Past Medical History Past Medical History:  Diagnosis Date   Benign essential tremor    Benign positional vertigo    Chronic kidney disease 08/28/2019   CVA (cerebral vascular accident) ([place])    x2 - L retina, 1 right parietal   Degenerative arthritis    Depression    Diabetes mellitus    DVT (deep venous thrombosis) ([place]) 2018   Dyslipidemia    GERD (gastroesophageal reflux disease)    hiatal hernia   Gout    H/O: vasectomy    Hearing aid worn    b/l   Hx of appendectomy    Hx of tonsillectomy    Hypertension    Hypertrophic cardiomyopathy ([place])    Ischemic optic neuropathy    on the left   Melanoma ([place])    NSVT (nonsustained ventricular tachycardia) ([place])    1 4 beat run on event monitor in 07/2020   Obesity    OSA on CPAP    setting = 5   Pulmonary emboli ([place]) 2018   PVC's (premature ventricular contractions)    SVT (supraventricular tachycardia)    by event monitor   Tremor, essential 06/22/2017   Wears glasses    Patient Active Problem  List   Diagnosis Date Noted   Ataxia 08/05/2021   Confusion 08/05/2021   Dystrophia unguium 08/05/2021   Encounter for fitting and adjustment of hearing aid 08/05/2021   Foot ulcer ([place]) 08/05/2021   Hypoxia 08/05/2021   Edema 08/05/2021   Neuropathy 08/05/2021   Pericardial effusion 08/05/2021   Staphylococcal infectious disease 08/05/2021   Unilateral primary osteoarthritis, right knee 08/05/2021   Cerebral thrombosis with cerebral infarction 04/24/2021   Bacteremia, coagulase-negative staphylococcal 04/21/2021   History of pulmonary embolism 04/21/2021   Recent cerebrovascular accident (CVA) 04/21/2021   DOE (dyspnea on exertion) 01/30/2021   Acute hypoxemic respiratory failure ([place]) 01/29/2021   Bilateral hearing loss 05/15/2020   Bilateral impacted cerumen 05/15/2020   Fungal otitis externa 05/15/2020   Impacted cerumen 05/14/2020   Hearing loss 03/27/2020   Melanocytic nevi, unspecified 03/27/2020   Aphasia 09/01/2019   Cardiomegaly 09/01/2019   Dysphagia 09/01/2019   Cerebral embolism with cerebral infarction 08/29/2019   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[place]) 08/28/2019   Hyperlipidemia 08/28/2019   Chronic kidney disease 08/28/2019   Obesity (BMI 30-39.9) 08/28/2019   Presence of other specified devices 07/28/2019   Tremor 07/07/2019   Cough 01/13/2019   Lymphadenopathy 12/31/2018   Abnormal findings on diagnostic imaging of other specified body structures 06/18/2018   Noncompliance with treatment 03/25/2018   Epistaxis 01/14/2018   Essential tremor 06/22/2017  Fatigue 06/19/2017   Hardening of the aorta (main artery of the heart) ([place]) 04/15/2017   Long term (current) use of anticoagulants 04/14/2017   OSA on CPAP 04/10/2017   Essential hypertension 04/10/2017   Pulmonary thromboembolism ([place]) 04/10/2017   PE (pulmonary thromboembolism) ([place]) 04/09/2017   Orthostatic hypotension 04/09/2017   S/P left TKA 03/30/2017   S/P total knee replacement 03/30/2017    Electrocardiogram abnormal 07/29/2016   Constipation 05/26/2016   Varicose veins of lower extremity 05/26/2016   Cellulitis of left foot 03/07/2016   Pre-ulcerative calluses 03/07/2016   Type 2 diabetes mellitus with left diabetic foot ulcer ([place]) 01/14/2016   Diabetic peripheral neuropathy associated with type 2 diabetes mellitus ([place]) 10/19/2015   Plantar wart 08/08/2015   Encounter for general adult medical examination without abnormal findings 05/17/2015   Chronic kidney disease, stage 3a ([place]) 10/21/2013   Late effects of cerebrovascular disease 10/21/2013   Irritability and anger 03/04/2013   Major depression, single episode, in complete remission ([place]) 03/04/2013   Panic disorder 03/04/2013   Disequilibrium 04/17/2011   Allergic rhinitis 01/23/2011   Diabetic renal disease ([place]) 11/29/2009   Gout 09/20/2009   Osteoarthritis 09/20/2009   Proteinuria 09/20/2009   Ventricular premature depolarization 09/20/2009   Diabetes mellitus type 2 in obese ([place]) 08/07/2008   GERD 08/07/2008   Home Medication(s) Prior to Admission medications   Medication Sig Start Date End Date Taking? Authorizing Provider  amLODipine (NORVASC) 10 MG tablet TAKE ONE TABLET AT BEDTIME. 03/11/22   Donato Heinz, MD  apixaban (ELIQUIS) 5 MG TABS tablet TAKE 1 TABLET TWICE DAILY 11/20/22   Donato Heinz, MD  aspirin 81 MG EC tablet 1 tablet 04/23/21   [provider]  atorvastatin (LIPITOR) 80 MG tablet Take 1 tablet (80 mg total) by mouth daily at 6 PM. Patient taking differently: Take 40 mg by mouth daily at 6 PM. 08/31/19   Danford, Suann Larry, MD  cephALEXin (KEFLEX) 500 MG capsule Take 1 capsule (500 mg total) by mouth 3 (three) times daily. 12/05/22   Trula Slade, DPM  colchicine 0.6 MG tablet colchicine 0.6 mg tablet  TAKE ONE TABLET BY MOUTH TWICE DAILY.    [provider]  Continuous Blood Gluc Sensor (FREESTYLE LIBRE 2 SENSOR) MISC See admin instructions.     [provider]  cycloSPORINE (RESTASIS) 0.05 % ophthalmic emulsion Apply to eye. 01/29/21   [provider]  doxycycline (VIBRA-TABS) 100 MG tablet Take 1 tablet (100 mg total) by mouth 2 (two) times daily. 10/23/22   Trula Slade, DPM  DULoxetine (CYMBALTA) 60 MG capsule Take 60 mg by mouth daily.    [provider]  escitalopram (LEXAPRO) 20 MG tablet Take 20 mg by mouth daily. 03/25/20   [provider]  ezetimibe (ZETIA) 10 MG tablet TAKE ONE TABLET BY MOUTH ONCE DAILY. 03/19/22   Donato Heinz, MD  fenofibrate 54 MG tablet Take 54 mg by mouth daily.    [provider]  furosemide (LASIX) 40 MG tablet Take 1 tablet (40 mg total) by mouth as needed (Weight increase of 3 pounds in 1 day or 5 pounds in 1 week.). 03/26/21   Donato Heinz, MD  Insulin Glargine, 1 Unit Dial, (TOUJEO SOLOSTAR) 300 UNIT/ML SOPN Inject 80 Units into the skin 2 (two) times daily.    [provider]  insulin lispro (HUMALOG) 100 UNIT/ML KwikPen Inject 15-25 Units into the skin See admin instructions. Inject 15 units subcutaneously after  breakfast and 25 units after lunch and supper    [provider]  losartan (COZAAR) 100 MG tablet Take 1 tablet by mouth daily. 03/25/20   [provider]  Lutein-Zeaxanthin 25-5 MG CAPS Take 1 tablet by mouth daily.    [provider]  metFORMIN (GLUCOPHAGE) 500 MG tablet Take 500 mg by mouth daily with breakfast. 03/25/20   [provider]  metFORMIN (GLUCOPHAGE) 500 MG tablet 1 tablet with a meal    [provider]  metoprolol succinate (TOPROL-XL) 50 MG 24 hr tablet Take 1 tablet (50 mg total) by mouth daily. Take with or immediately following a meal. 02/12/22 02/07/23  Donato Heinz, MD  mirtazapine (REMERON) 7.5 MG tablet Take 7.5 mg by mouth at bedtime.    [provider]  Multiple Vitamin (MULTIVITAMIN WITH MINERALS) TABS tablet Take 1 tablet by mouth  daily.    [provider]  Omega-3 Fatty Acids (FISH OIL) 1000 MG CAPS Take 2,000 mg by mouth daily. Reported on 02/13/2016    [provider]  [facility] VERIO test strip  09/13/18   [provider]  Polyethyl Glycol-Propyl Glycol (SYSTANE OP) Place 1-2 drops into both eyes 4 (four) times daily as needed (dry eyes).     [provider]  PRESCRIPTION MEDICATION Inhale into the lungs at bedtime. CPAP    [provider]  RESTASIS 0.05 % ophthalmic emulsion Place 1 drop into both eyes 2 (two) times daily as needed (dry eyes). 01/29/21   [provider]  silver sulfADIAZINE (SILVADENE) 1 % cream Apply 1 Application topically daily. 08/21/22   Trula Slade, DPM  SURE COMFORT PEN NEEDLES 31G X 5 MM MISC  09/12/19   [provider]                                                                                                                                    Past Surgical History Past Surgical History:  Procedure Laterality Date   APPENDECTOMY     arthroscopic knee surgery Bilateral    CATARACT EXTRACTION Bilateral    COLONOSCOPY     MINOR PLACEMENT OF FIDUCIAL N/A 06/30/2019   Procedure: Fiducial placement;  Surgeon: Erline Levine, MD;  Location: [facility];  Service: Neurosurgery;  Laterality: N/A;  Fiducial placement   NASAL SEPTUM SURGERY     PULSE GENERATOR IMPLANT N/A 07/14/2019   Procedure: Left cranial Implanted Pulse Generator and lead extension placement to right chest ;  Surgeon: Erline Levine, MD;  Location: [facility];  Service: Neurosurgery;  Laterality: N/A;   SUBTHALAMIC STIMULATOR INSERTION Left 07/07/2019   Procedure: LEFT DEEP BRAIN STIMULATOR PLACEMENT;  Surgeon: Erline Levine, MD;  Location: [facility];  Service: Neurosurgery;  Laterality: Left;   TEE WITHOUT CARDIOVERSION N/A 04/23/2021   Procedure: TRANSESOPHAGEAL ECHOCARDIOGRAM (TEE);  Surgeon: Acie Fredrickson Wonda Cheng, MD;  Location: [facility];  Service: Cardiovascular;  Laterality:  N/A;  TONSILLECTOMY     TOTAL KNEE ARTHROPLASTY Left 03/30/2017   Procedure: LEFT TOTAL KNEE ARTHROPLASTY;  Surgeon: Paralee Cancel, MD;  Location: [facility];  Service: Orthopedics;  Laterality: Left;   WISDOM TOOTH EXTRACTION     Family History Family History  Problem Relation Age of Onset   Cerebral aneurysm Mother    Tremor Mother    Alzheimer's disease Father    Tremor Brother    Tremor Maternal Uncle    Diabetes Maternal Grandmother    Healthy Son    Colon cancer Neg Hx     Social History Social History   Tobacco Use   Smoking status: Former    Packs/day: 1.00    Years: 10.00    Total pack years: 10.00    Types: Cigarettes    Quit date: 11/25/1983    Years since quitting: 39.1   Smokeless tobacco: Never  Vaping Use   Vaping Use: Never used  Substance Use Topics   Alcohol use: Yes    Alcohol/week: 2.0 standard drinks of alcohol    Types: 2 Standard drinks or equivalent per week    Comment: occassionally   Drug use: No   Allergies Melatonin  Review of Systems Review of Systems  Neurological:  Positive for numbness.    Physical Exam Vital Signs  I have reviewed the triage vital signs BP 130/80   Pulse 75   Temp 98 F (36.7 C) (Oral)   Resp 17   Ht '6\' 4"'$  (1.93 m)   Wt 115.2 kg   SpO2 99%   BMI 30.92 kg/m   Physical Exam Constitutional:      General: He is not in acute distress.    Appearance: Normal appearance.  HENT:     Head: Normocephalic and atraumatic.     Nose: No congestion or rhinorrhea.  Eyes:     General:        Right eye: No discharge.        Left eye: No discharge.     Extraocular Movements: Extraocular movements intact.     Pupils: Pupils are equal, round, and reactive to light.  Cardiovascular:     Rate and Rhythm: Normal rate and regular rhythm.     Heart sounds: No murmur heard. Pulmonary:     Effort: No respiratory distress.     Breath sounds: No wheezing or rales.  Abdominal:     General: There is no distension.      Tenderness: There is no abdominal tenderness.  Musculoskeletal:        General: Normal range of motion.     Cervical back: Normal range of motion.  Skin:    General: Skin is warm and dry.  Neurological:     General: No focal deficit present.     Mental Status: He is alert.     Cranial Nerves: No cranial nerve deficit.     Sensory: No sensory deficit.     Motor: No weakness.     Comments: Tremors, baseline     ED Results and Treatments Labs (all labs ordered are listed, but only abnormal results are displayed) Labs Reviewed  COMPREHENSIVE METABOLIC PANEL - Abnormal; Notable for the following components:      Result Value   CO2 21 (*)    Glucose, Bld 128 (*)    BUN 32 (*)    Creatinine, Ser 2.00 (*)    Albumin 3.3 (*)    GFR, Estimated 35 (*)    All other components  within normal limits  CBC WITH DIFFERENTIAL/PLATELET - Abnormal; Notable for the following components:   RBC 4.08 (*)    Hemoglobin 12.1 (*)    HCT 37.1 (*)    All other components within normal limits  CBG MONITORING, ED - Abnormal; Notable for the following components:   Glucose-Capillary 232 (*)    All other components within normal limits                                                                                                                          Radiology CT Head Wo Contrast  Result Date: 01/23/2023 CLINICAL DATA:  Right arm tingling EXAM: CT HEAD WITHOUT CONTRAST TECHNIQUE: Contiguous axial images were obtained from the base of the skull through the vertex without intravenous contrast. RADIATION DOSE REDUCTION: This exam was performed according to the departmental dose-optimization program which includes automated exposure control, adjustment of the mA and/or kV according to patient size and/or use of iterative reconstruction technique. COMPARISON:  10/08/2021 FINDINGS: Brain: No evidence of acute infarction, hemorrhage, hydrocephalus, extra-axial collection or mass lesion/mass effect. Mild atrophic  changes are noted. Changes of prior white matter ischemic change are noted as well. Basal ganglia infarct is noted on the right stable from the prior exam. Deep brain stimulator is noted extending into the left thalamus. Vascular: No hyperdense vessel or unexpected calcification. Skull: Normal. Negative for fracture or focal lesion. Sinuses/Orbits: No acute finding. Other: None. IMPRESSION: Chronic atrophic and ischemic changes.  No acute abnormality noted. Electronically Signed   By: Inez Catalina M.D.   On: 01/23/2023 20:15    Pertinent labs & imaging results that were available during my care of the patient were reviewed by me and considered in my medical decision making (see MDM for details).  Medications Ordered in ED Medications - No data to display                                                                                                                                   Procedures Procedures  (including critical care time)  Medical Decision Making / ED Course   This patient presents to the ED for concern of paresthesias, this involves an extensive number of treatment options, and is a complaint that carries with it a high risk of complications and morbidity.  The differential diagnosis includes CVA, TIA, electrolyte abnormality, paresthesia, recrudescence  MDM: Patient seen the  emergency room for evaluation of numbness and tingling.  Physical exam is unremarkable with no appreciable numbness, tingling, weakness or cranial nerve deficits on exam.  No focal motor or sensory deficits.  Laboratory evaluation showing a slight increase in his creatinine to 2.0 from a baseline of 1.7.  Hemoglobin 12.1.  Glucose 232.  CT head with no evidence of acute bleed.  In regards to the patient's creatinine elevation, I encouraged patient to increase his water intake and as this is not really that far from his baseline, he will follow-up outpatient with his PCP for creatinine recheck after aggressive  hydration at home.  While the patient's symptoms certainly could be a TIA, I had a shared decision-making discussion with the patient about advanced imaging including CT angiography and MRI but after further discussion, we decided that since his symptoms have completely resolved and he has close contact with his neurologist, we will opt for outpatient follow-up and outpatient imaging if his neurologist thinks this is necessary.  He was given very strict return of which she voiced understanding and ultimately discharged with outpatient neurology and PCP follow-up.   Additional history obtained:  -External records from outside source obtained and reviewed including: Chart review including previous notes, labs, imaging, consultation notes   Lab Tests: -I ordered, reviewed, and interpreted labs.   The pertinent results include:   Labs Reviewed  COMPREHENSIVE METABOLIC PANEL - Abnormal; Notable for the following components:      Result Value   CO2 21 (*)    Glucose, Bld 128 (*)    BUN 32 (*)    Creatinine, Ser 2.00 (*)    Albumin 3.3 (*)    GFR, Estimated 35 (*)    All other components within normal limits  CBC WITH DIFFERENTIAL/PLATELET - Abnormal; Notable for the following components:   RBC 4.08 (*)    Hemoglobin 12.1 (*)    HCT 37.1 (*)    All other components within normal limits  CBG MONITORING, ED - Abnormal; Notable for the following components:   Glucose-Capillary 232 (*)    All other components within normal limits      EKG   EKG Interpretation  Date/Time:  Friday January 23 2023 16:44:38 EST Ventricular Rate:  72 PR Interval:  244 QRS Duration: 120 QT Interval:  388 QTC Calculation: 424 R Axis:   -64 Text Interpretation: Sinus rhythm with 1st degree A-V block Left bundle branch block Abnormal ECG When compared with ECG of 21-Apr-2021 12:40, PREVIOUS ECG IS PRESENT Confirmed by Leily Capek ([id number]) on 01/24/2023 12:22:01 PM         Imaging Studies ordered: I ordered  imaging studies including CT head I independently visualized and interpreted imaging. I agree with the radiologist interpretation   Medicines ordered and prescription drug management: No orders of the defined types were placed in this encounter.   -I have reviewed the patients home medicines and have made adjustments as needed  Critical interventions none  Cardiac Monitoring: The patient was maintained on a cardiac monitor.  I personally viewed and interpreted the cardiac monitored which showed an underlying rhythm of: NSR,LBBB  Social Determinants of Health:  Factors impacting patients care include: none   Reevaluation: After the interventions noted above, I reevaluated the patient and found that they have :improved  Co morbidities that complicate the patient evaluation  Past Medical History:  Diagnosis Date   Benign essential tremor    Benign positional vertigo    Chronic kidney disease 08/28/2019  CVA (cerebral vascular accident) ([place])    x2 - L retina, 1 right parietal   Degenerative arthritis    Depression    Diabetes mellitus    DVT (deep venous thrombosis) ([place]) 2018   Dyslipidemia    GERD (gastroesophageal reflux disease)    hiatal hernia   Gout    H/O: vasectomy    Hearing aid worn    b/l   Hx of appendectomy    Hx of tonsillectomy    Hypertension    Hypertrophic cardiomyopathy ([place])    Ischemic optic neuropathy    on the left   Melanoma ([place])    NSVT (nonsustained ventricular tachycardia) ([place])    1 4 beat run on event monitor in 07/2020   Obesity    OSA on CPAP    setting = 5   Pulmonary emboli ([place]) 2018   PVC's (premature ventricular contractions)    SVT (supraventricular tachycardia)    by event monitor   Tremor, essential 06/22/2017   Wears glasses       Dispostion: I considered admission for this patient, but using shared decision-making, we decided for close outpatient neurologic follow-up and strict return precautions of which he  voiced understanding.  Patient then discharged outpatient neurology and PCP follow-up     Final Clinical Impression(s) / ED Diagnoses Final diagnoses:  Paresthesia  AKI (acute kidney injury) ([place])     '@PCDICTATION'$ @    Teressa Lower, MD 01/24/23 1222

## 2023-01-25 NOTE — Progress Notes (Signed)
Joshua, ANDRE [name] ([id number]) [id number] Page 1 of 9 Visit Report for 01/22/2023 Allergy List Details Patient Name: Date of Service: [name] 01/22/2023 8:00 Joshua [name] Medical Record Number: [id number] Patient Account Number: [id number] Date of Birth/Sex: Treating RN: [birth year] (72 y.o. Joshua [name] Primary Care Joshua [name]: Joshua [name] Other Clinician: Referring Joshua [name]: Treating Joshua [name]/Extender: Joshua [name] in Treatment: 0 Allergies Active Allergies melatonin Allergy Notes Electronic Signature(s) Signed: 01/22/2023 4:19:23 PM By: Rhae Hammock RN Entered By: Rhae Hammock on 01/22/2023 09:09:59 -------------------------------------------------------------------------------- Arrival Information Details Patient Name: Date of Service: Joshua [name]. 01/22/2023 8:00 Joshua [name] Medical Record Number: [id number] Patient Account Number: [id number] Date of Birth/Sex: Treating RN: May 04, 1951 (72 y.o. Joshua [name] Primary Care Ola Raap: Joshua [name] Other Clinician: Referring Charice Zuno: Treating Joshua [name]/Extender: Joshua [name] in Treatment: 0 Visit Information Patient Arrived: Joshua [name] Time: 08:42 Accompanied By: self Transfer Assistance: Manual Patient Identification Verified: Yes Secondary Verification Process Completed: Yes Patient Requires Transmission-Based Precautions: No Patient Has Alerts: Yes History Since Last Visit Added or deleted any medications: No Any new allergies or adverse reactions: No Had [name] fall or experienced change in activities of daily living that may affect risk of falls: No Signs or symptoms of abuse/neglect since last visito No Hospitalized since last visit: No Implantable device outside of the clinic excluding cellular tissue based products placed in the center since last visit: No Electronic Signature(s) Signed: 01/22/2023 4:19:23 PM By:  Rhae Hammock RN Entered By: Rhae Hammock on 01/22/2023 08:42:21 -------------------------------------------------------------------------------- Clinic Level of Care Assessment Details Patient Name: Date of Service: [name]. 01/22/2023 8:00 Joshua [name] Medical Record Number: [id number] Patient Account Number: [id number] Date of Birth/Sex: Treating RN: Oct 05, 1951 (72 y.o. Joshua [name] Primary Care Theona Muhs: Joshua [name] Other Clinician: Referring Taijah Macrae: Treating Mulan Adan/Extender: Joshua [name] in Treatment: 0 Clinic Level of Care Assessment Items TOOL 3 Quantity Score X- 1 0 Use when EandM and Procedure is performed on FOLLOW-UP visit ASSESSMENTS - Nursing Assessment / Reassessment Joshua [name], Joshua [name] ([id number]) [phone] Page 2 of 9 X- 1 10 Reassessment of Co-morbidities (includes updates in patient status) X- 1 5 Reassessment of Adherence to Treatment Plan ASSESSMENTS - Wound and Skin Assessment / Reassessment '[]'$  - Points for Wound Assessment can only be taken for [name] new wound of unknown or different etiology and [name] procedure is 0 NOT performed to that wound X- 1 5 Simple Wound Assessment / Reassessment - one wound '[]'$  - 0 Complex Wound Assessment / Reassessment - multiple wounds '[]'$  - 0 Dermatologic / Skin Assessment (not related to wound area) ASSESSMENTS - Focused Assessment X- 1 5 Circumferential Edema Measurements - multi extremities '[]'$  - 0 Nutritional Assessment / Counseling / Intervention '[]'$  - 0 Lower Extremity Assessment (monofilament, tuning fork, pulses) '[]'$  - 0 Peripheral Arterial Disease Assessment (using hand held doppler) ASSESSMENTS - Ostomy and/or Continence Assessment and Care '[]'$  - 0 Incontinence Assessment and Management '[]'$  - 0 Ostomy Care Assessment and Management (repouching, etc.) PROCESS - Coordination of Care '[]'$  - Points for Discharge Coordination can only be taken for [name] new  wound of unknown or different etiology and [name] procedure 0 is NOT performed to that wound X- 1 15 Simple Patient / Family Education for ongoing care '[]'$  - 0 Complex (extensive) Patient / Family Education for ongoing care X- 1 10 Staff obtains Consents, Records, T Results / Process Orders est '[]'$  - 0 Staff telephones  HHA, Nursing Homes / Clarify orders / etc '[]'$  - 0 Routine Transfer to another Facility (non-emergent condition) '[]'$  - 0 Routine Hospital Admission (non-emergent condition) X- 1 15 New Admissions / Biomedical engineer / Ordering NPWT Apligraf, etc. , '[]'$  - 0 Emergency Hospital Admission (emergent condition) X- 1 10 Simple Discharge Coordination '[]'$  - 0 Complex (extensive) Discharge Coordination PROCESS - Special Needs '[]'$  - 0 Pediatric / Minor Patient Management '[]'$  - 0 Isolation Patient Management '[]'$  - 0 Hearing / Language / Visual special needs '[]'$  - 0 Assessment of Community assistance (transportation, D/C planning, etc.) '[]'$  - 0 Additional assistance / Altered mentation '[]'$  - 0 Support Surface(s) Assessment (bed, cushion, seat, etc.) INTERVENTIONS - Wound Cleansing / Measurement '[]'$  - Points for Wound Cleaning / Measurement, Wound Dressing, Specimen Collection and Specimen taken to lab can only 0 be taken for [name] new wound of unknown or different etiology and [name] procedure is NOT performed to that wound X- 1 5 Simple Wound Cleansing - one wound '[]'$  - 0 Complex Wound Cleansing - multiple wounds X- 1 5 Wound Imaging (photographs - any number of wounds) '[]'$  - 0 Wound Tracing (instead of photographs) X- 1 5 Simple Wound Measurement - one wound '[]'$  - 0 Complex Wound Measurement - multiple wounds INTERVENTIONS - Wound Dressings Joshua [name], Joshua [name] ([id number] Page 3 of 9 '[]'$  - 0 Small Wound Dressing one or multiple wounds X- 1 15 Medium Wound Dressing one or multiple wounds '[]'$  - 0 Large Wound Dressing one or multiple  wounds INTERVENTIONS - Miscellaneous '[]'$  - 0 External ear exam '[]'$  - 0 Specimen Collection (cultures, biopsies, blood, body fluids, etc.) '[]'$  - 0 Specimen(s) / Culture(s) sent or taken to Lab for analysis '[]'$  - 0 Patient Transfer (multiple staff / Civil Service fast streamer / Similar devices) '[]'$  - 0 Simple Staple / Suture removal (25 or less) '[]'$  - 0 Complex Staple / Suture removal (26 or more) '[]'$  - 0 Hypo / Hyperglycemic Management (close monitor of Blood Glucose) '[]'$  - 0 Ankle / Brachial Index (ABI) - do not check if billed separately X- 1 5 Vital Signs Has the patient been seen at the hospital within the last three years: Yes Total Score: 110 Level Of Care: New/Established - Level 3 Electronic Signature(s) Signed: 01/22/2023 4:19:23 PM By: Rhae Hammock RN Entered By: Rhae Hammock on 01/22/2023 09:13:13 -------------------------------------------------------------------------------- Encounter Discharge Information Details Patient Name: Date of Service: Joshua [name]. 01/22/2023 8:00 Joshua [name] Medical Record Number: [id number] Patient Account Number: [id number] Date of Birth/Sex: Treating RN: 1951-01-03 (72 y.o. Joshua [name] Primary Care Anglia Blakley: Joshua [name] Other Clinician: Referring Salimata Christenson: Treating Mercadies Co/Extender: Joshua [name] in Treatment: 0 Encounter Discharge Information Items Post Procedure Vitals Discharge Condition: Stable Temperature (F): 98.7 Ambulatory Status: Ambulatory Pulse (bpm): 74 Discharge Destination: Home Respiratory Rate (breaths/min): 17 Transportation: Private Auto Blood Pressure (mmHg): 120/80 Accompanied By: self Schedule Follow-up Appointment: Yes Clinical Summary of Care: Patient Declined Electronic Signature(s) Signed: 01/22/2023 4:19:23 PM By: Rhae Hammock RN Entered By: Rhae Hammock on 01/22/2023 09:13:54 -------------------------------------------------------------------------------- Lower  Extremity Assessment Details Patient Name: Date of Service: [name]. 01/22/2023 8:00 Joshua [name] Medical Record Number: [id number] Patient Account Number: [id number] Date of Birth/Sex: Treating RN: 01/20/1951 (72 y.o. Joshua [name] Primary Care Bertin Inabinet: Joshua [name] Other Clinician: Referring Lavita Pontius: Treating Scout Guyett/Extender: Joshua [name] in Treatment: 0 Edema Assessment Assessed: [name]: No] [name]: No] [Left: Edema] [name]: :] Earl Lites BLAYZE, YEBRA [name] ([id number])  [id number].pdf Page 4 of 9 Left: Right: Point of Measurement: 40 cm From Medial Instep 34.5 cm Ankle Left: Right: Point of Measurement: 11 cm From Medial Instep 24.5 cm Knee To Floor Left: Right: From Medial Instep 50 cm Vascular Assessment Pulses: Dorsalis Pedis Palpable: [Left:Yes] Posterior Tibial Palpable: [Left:Yes] Electronic Signature(s) Signed: 01/22/2023 4:19:23 PM By: Rhae Hammock RN Entered By: Rhae Hammock on 01/22/2023 08:13:39 -------------------------------------------------------------------------------- Multi Wound Chart Details Patient Name: Date of Service: Joshua [name]. 01/22/2023 8:00 Joshua [name] Medical Record Number: [id number] Patient Account Number: [id number] Date of Birth/Sex: Treating RN: 1951/03/20 (72 y.o. [name]) Primary Care Arnold Kester: Joshua [name] Other Clinician: Referring Dwaine Pringle: Treating Shonica Weier/Extender: Joshua [name] in Treatment: 0 Vital Signs Height(in): Pulse(bpm): Weight(lbs): Blood Pressure(mmHg): Body Mass Index(BMI): Temperature(F): Respiratory Rate(breaths/min): 17 [4:Photos:] [[name]:[name]] Left Foot [name] [name] Wound Location: Gradually Appeared [name] [name] Wounding Event: Diabetic Wound/Ulcer of the Lower [name] [name] Primary Etiology: Extremity Cataracts, Hypertension, Type II [name] [name] Comorbid History: Diabetes, Neuropathy 09/24/2022 [name] [name] Date Acquired: 0 [name] [name] Weeks of  Treatment: Open [name] [name] Wound Status: No [name] [name] Wound Recurrence: Yes [name] [name] Pending [name] mputation on Presentation: 0.5x0.5x0.7 [name] [name] Measurements L x W x D (cm) 0.196 [name] [name] [name] (cm) : rea 0.137 [name] [name] Volume (cm) : 12 Starting Position 1 (o'clock): 12 Ending Position 1 (o'clock): 0.7 Maximum Distance 1 (cm): Yes [name] [name] Undermining: Grade 1 [name] [name] Classification: Medium [name] [name] Exudate [name] mount: Serosanguineous [name] [name] Exudate TypeJAIVAN, Joshua [name] ([id number] Page 5 of 9 red, brown [name] [name] Exudate Color: Yes [name] [name] Foul Odor [name] Cleansing: fter No [name] [name] Odor Anticipated Due to Product Use: Distinct, outline attached [name] [name] Wound Margin: Large (67-100%) [name] [name] Granulation [name] mount: Red, Pink [name] [name] Granulation Quality: Small (1-33%) [name] [name] Necrotic Amount: Fat Layer (Subcutaneous Tissue): Yes [name] [name] Exposed Structures: Fascia: No Tendon: No Muscle: No Joint: No Bone: No None [name] [name] Epithelialization: Debridement - Excisional [name] [name] Debridement: Pre-procedure Verification/Time Out 09:00 [name] [name] Taken: Lidocaine [name] [name] Pain Control: Callus, Subcutaneous, Slough [name] [name] Tissue Debrided: Skin/Subcutaneous Tissue [name] [name] Level: 0.25 [name] [name] Debridement [name] (sq cm): rea Curette [name] [name] Instrument: Minimum [name] [name] Bleeding: Pressure [name] [name] Hemostasis [name] chieved: 0 [name] [name] Procedural Pain: 0 [name] [name] Post Procedural Pain: Procedure was tolerated well [name] [name] Debridement Treatment Response: 0.5x0.5x0.7 [name] [name] Post Debridement Measurements L x W x D (cm) 0.137 [name] [name] Post Debridement Volume: (cm) Callus: Yes [name] [name] Periwound Skin Texture: Excoriation: No Induration: No Crepitus: No Rash: No Scarring: No Maceration: No [name] [name] Periwound Skin Moisture: Dry/Scaly: No Atrophie Blanche: No [name] [name] Periwound Skin Color: Cyanosis: No Ecchymosis: No Erythema: No Hemosiderin  Staining: No Mottled: No Pallor: No Rubor: No No Abnormality [name] [name] Temperature: Yes [name] [name] Tenderness on Palpation: Debridement [name] [name] Procedures Performed: Treatment Notes Wound #4 (Foot) Wound Laterality: Left Cleanser Vashe 5.8 (oz) Discharge Instruction: Cleanse the wound with Vashe prior to applying [name] clean dressing using gauze sponges, not tissue or cotton balls. Peri-Wound Care Topical Primary Dressing Hydrofera Blue Ready Transfer Foam, 4x5 (in/in) Discharge Instruction: Apply to wound bed as instructed Secondary Dressing Optifoam Non-Adhesive Dressing, 4x4 in Discharge Instruction: Apply foam donut over primary dressing as directed. Woven Gauze Sponge, Non-Sterile 4x4 in Discharge Instruction: Apply over primary dressing as directed. Secured With [employer], 4.5x3.1 (in/yd) Discharge Instruction: Secure with Kerlix as directed. 24M Medipore H Soft  Cloth Surgical T ape, 4 x 10 (in/yd) Discharge Instruction: Secure with tape as directed. Stretch Net Size 5, 10 (yds) Compression Joshua [name], Joshua [name] ([id number]) [id number] Page 6 of 9 Compression Stockings Add-Ons Electronic Signature(s) Signed: 01/22/2023 12:51:55 PM By: Kalman Shan DO Entered By: Kalman Shan on 01/22/2023 09:14:33 -------------------------------------------------------------------------------- Multi-Disciplinary Care Plan Details Patient Name: Date of Service: Joshua [name]. 01/22/2023 8:00 Joshua [name] Medical Record Number: [id number] Patient Account Number: [id number] Date of Birth/Sex: Treating RN: Jan 11, 1951 (72 y.o. Joshua [name] Primary Care Norissa Bartee: Joshua [name] Other Clinician: Referring Aizen Duval: Treating Ravan Schlemmer/Extender: Joshua [name] in Treatment: 0 Active Inactive Orientation to the Wound Care Program Nursing Diagnoses: Knowledge deficit related to the wound healing center  program Goals: Patient/caregiver will verbalize understanding of the [facility] Date Initiated: 01/22/2023 Target Resolution Date: 01/30/2023 Goal Status: Active Interventions: Provide education on orientation to the wound center Notes: Wound/Skin Impairment Nursing Diagnoses: Impaired tissue integrity Knowledge deficit related to ulceration/compromised skin integrity Goals: Patient will have [name] decrease in wound volume by X% from date: (specify in notes) Date Initiated: 01/22/2023 Target Resolution Date: 01/30/2023 Goal Status: Active Patient/caregiver will verbalize understanding of skin care regimen Date Initiated: 01/22/2023 Target Resolution Date: 01/29/2023 Goal Status: Active Ulcer/skin breakdown will have [name] volume reduction of 30% by week 4 Date Initiated: 01/22/2023 Target Resolution Date: 01/30/2023 Goal Status: Active Interventions: Assess patient/caregiver ability to obtain necessary supplies Assess patient/caregiver ability to perform ulcer/skin care regimen upon admission and as needed Assess ulceration(s) every visit Notes: Electronic Signature(s) Signed: 01/22/2023 4:19:23 PM By: Rhae Hammock RN Entered By: Rhae Hammock on 01/22/2023 08:42:57 -------------------------------------------------------------------------------- Pain Assessment Details Patient Name: Date of Service: Joshua [name]. 01/22/2023 8:00 Joshua [name] Medical Record Number: [id number] Patient Account Number: [id number] Joshua [name], Joshua [name] ([id number]) [phone].pdf Page 7 of 9 Date of Birth/Sex: Treating RN: February 25, 1951 (72 y.o. Joshua [name] Primary Care Dupree Givler: Other Clinician: Shon [name] Referring Georgetta Crafton: Treating Neeley Sedivy/Extender: Joshua [name] in Treatment: 0 Active Problems Location of Pain Severity and Description of Pain Patient Has Paino No Site Locations Pain Management and Medication Current Pain  Management: Electronic Signature(s) Signed: 01/22/2023 4:19:23 PM By: Rhae Hammock RN Entered By: Rhae Hammock on 01/22/2023 08:13:22 -------------------------------------------------------------------------------- Patient/Caregiver Education Details Patient Name: Date of Service: [name] 2/29/2024andnbsp8:00 [place] Record Number: [id number] Patient Account Number: [id number] Date of Birth/Gender: Treating RN: September 22, 1951 (72 y.o. Erie Noe Primary Care Physician: Joshua [name] Other Clinician: Referring Physician: Treating Physician/Extender: Joshua [name] in Treatment: 0 Education Assessment Education Provided To: Patient Education Topics Provided Wound/Skin Impairment: Methods: Explain/Verbal Responses: Reinforcements needed, State content correctly Electronic Signature(s) Signed: 01/22/2023 4:19:23 PM By: Rhae Hammock RN Entered By: Rhae Hammock on 01/22/2023 08:43:05 -------------------------------------------------------------------------------- Wound Assessment Details Patient Name: Date of Service: Joshua [name]. 01/22/2023 8:00 Joshua [name] Medical Record Number: [id number] Patient Account Number: [id number] Date of Birth/Sex: Treating RN: [birth year] (72 y.o. Erie Noe Primary Care Adamarys Shall: Joshua [name] Other Clinician: Charlcie [name] ([id number]) [id number] Page 8 of 9 Referring Jacques Willingham: Treating Azyria Osmon/Extender: Joshua [name] in Treatment: 0 Wound Status Wound Number: 4 Primary Etiology: Diabetic Wound/Ulcer of the Lower Extremity Wound Location: Left Foot Wound Status: Open Wounding Event: Gradually Appeared Comorbid History: Cataracts, Hypertension, Type II Diabetes, Neuropathy Date Acquired: 09/24/2022 Weeks Of Treatment: 0 Clustered Wound: No Photos Wound Measurements Length: (cm) 0.5 Width: (cm) 0.5  Depth: (cm) 0.7 Area:  (cm) 0.196 Volume: (cm) 0.137 % Reduction in Area: % Reduction in Volume: Epithelialization: None Tunneling: No Undermining: Yes Starting Position (o'clock): 12 Ending Position (o'clock): 12 Maximum Distance: (cm) 0.7 Wound Description Classification: Grade 1 Wound Margin: Distinct, outline attached Exudate Amount: Medium Exudate Type: Serosanguineous Exudate Color: red, brown Foul Odor After Cleansing: Yes Due to Product Use: No Slough/Fibrino Yes Wound Bed Granulation Amount: Large (67-100%) Exposed Structure Granulation Quality: Red, Pink Fascia Exposed: No Necrotic Amount: Small (1-33%) Fat Layer (Subcutaneous Tissue) Exposed: Yes Necrotic Quality: Adherent Slough Tendon Exposed: No Muscle Exposed: No Joint Exposed: No Bone Exposed: No Periwound Skin Texture Texture Color No Abnormalities Noted: No No Abnormalities Noted: No Callus: Yes Atrophie Blanche: No Crepitus: No Cyanosis: No Excoriation: No Ecchymosis: No Induration: No Erythema: No Rash: No Hemosiderin Staining: No Scarring: No Mottled: No Pallor: No Moisture Rubor: No No Abnormalities Noted: No Dry / Scaly: No Temperature / Pain Maceration: No Temperature: No Abnormality Tenderness on Palpation: Yes Electronic Signature(s) Signed: 01/22/2023 4:19:23 PM By: Rhae Hammock RN Signed: 01/22/2023 5:16:11 PM By: Deon Pilling RN, BSN Entered By: Deon Pilling on 01/22/2023 08:18:39 Joshua [name] Page 9 of 9 -------------------------------------------------------------------------------- Vitals Details Patient Name: Date of Service: [name] 01/22/2023 8:00 Joshua [name] Medical Record Number: [id number] Patient Account Number: [id number] Date of Birth/Sex: Treating RN: November 23, 1951 (72 y.o. Joshua [name] Primary Care Adriannah Steinkamp: Joshua [name] Other Clinician: Referring Mykelti Goldenstein: Treating Wylie Russon/Extender: Joshua [name] in Treatment: 0 Vital Signs Time Taken: 08:00 Respiratory Rate (breaths/min): 17 Reference Range: 80 - 120 mg / dl Electronic Signature(s) Signed: 01/22/2023 4:19:23 PM By: Rhae Hammock RN Entered By: Rhae Hammock on 01/22/2023 08:42:29

## 2023-01-27 ENCOUNTER — Telehealth: Payer: Self-pay | Admitting: *Deleted

## 2023-01-27 NOTE — Telephone Encounter (Signed)
        Patient  visited [facility] on 01/23/2023  for treatment    Telephone encounter attempt :  1st  A HIPAA compliant voice message was left requesting a return call.  Instructed patient to call back at [phone].  [facility] [phone] [address] , [place] [address] Email : [email or web address]

## 2023-01-29 ENCOUNTER — Ambulatory Visit: Payer: Medicare PPO | Admitting: Podiatry

## 2023-01-29 ENCOUNTER — Encounter (HOSPITAL_BASED_OUTPATIENT_CLINIC_OR_DEPARTMENT_OTHER): Payer: Medicare PPO | Attending: Internal Medicine | Admitting: Internal Medicine

## 2023-01-29 DIAGNOSIS — E11621 Type 2 diabetes mellitus with foot ulcer: Secondary | ICD-10-CM | POA: Insufficient documentation

## 2023-01-29 DIAGNOSIS — L97522 Non-pressure chronic ulcer of other part of left foot with fat layer exposed: Secondary | ICD-10-CM | POA: Diagnosis not present

## 2023-01-29 DIAGNOSIS — G250 Essential tremor: Secondary | ICD-10-CM | POA: Diagnosis not present

## 2023-01-30 DIAGNOSIS — S91302A Unspecified open wound, left foot, initial encounter: Secondary | ICD-10-CM | POA: Diagnosis not present

## 2023-01-31 NOTE — Progress Notes (Signed)
Joshua [name] [name] ([id number]) [id number] Page 1 of 9 Visit Report for 01/29/2023 Chief Complaint Document Details Patient Name: Date of Service: [name] 01/29/2023 1:15 PM Medical Record Number: [id number] Patient Account Number: [id number] Date of Birth/Sex: Treating RN: 04-26-1951 (72 y.o. M) Primary Care Provider: Shon [name] Other Clinician: Referring Provider: Treating Provider/Extender: Joshua [name] in Treatment: 1 Information Obtained from: Patient Chief Complaint 01/22/2023; left plantar diabetic foot ulcer Electronic Signature(s) Signed: 01/29/2023 4:58:12 PM By: Joshua Shan DO Entered By: Joshua [name] on 01/29/2023 15:47:59 -------------------------------------------------------------------------------- Debridement Details Patient Name: Date of Service: Joshua [name]. 01/29/2023 1:15 PM Medical Record Number: [id number] Patient Account Number: [id number] Date of Birth/Sex: Treating RN: 08/05/1951 (72 y.o. Joshua [name], Joshua [name] Primary Care Provider: Shon [name] Other Clinician: Referring Provider: Treating Provider/Extender: Joshua [name] in Treatment: 1 Debridement Performed for Assessment: Wound #4 Left Foot Performed By: Physician Joshua Shan, DO Debridement Type: Debridement Severity of Tissue Pre Debridement: Fat layer exposed Level of Consciousness (Pre-procedure): Awake and Alert Pre-procedure Verification/Time Out Yes - 13:37 Taken: Start Time: 13:37 Pain Control: Lidocaine T Area Debrided (L x W): otal 0.4 (cm) x 0.5 (cm) = 0.2 (cm) Tissue and other material debrided: Viable, Non-Viable, Callus, Slough, Subcutaneous, Slough Level: Skin/Subcutaneous Tissue Debridement Description: Excisional Instrument: Curette Bleeding: Minimum Hemostasis Achieved: Pressure End Time: 13:37 Procedural Pain: 0 Post Procedural Pain: 0 Response to Treatment: Procedure was  tolerated well Level of Consciousness (Post- Awake and Alert procedure): Post Debridement Measurements of Total Wound Length: (cm) 0.4 Width: (cm) 0.5 Depth: (cm) 0.2 Volume: (cm) 0.031 Character of Wound/Ulcer Post Debridement: Improved Severity of Tissue Post Debridement: Fat layer exposed Post Procedure Diagnosis Same as Pre-procedure Electronic Signature(s) Signed: 01/29/2023 4:33:02 PM By: Joshua Hammock RN Signed: 01/29/2023 4:58:12 PM By: Joshua Shan DO Entered By: Joshua [name] on 01/29/2023 13:38:19 Joshua [name] ([id number] Page 2 of 9 -------------------------------------------------------------------------------- HPI Details Patient Name: Date of Service: [name] 01/29/2023 1:15 PM Medical Record Number: [id number] Patient Account Number: [id number] Date of Birth/Sex: Treating RN: [birth year] (72 y.o. M) Primary Care Provider: Shon [name] Other Clinician: Referring Provider: Treating Provider/Extender: Joshua [name] in Treatment: 1 History of Present Illness Location: left foot was the initial wound and then he has one on the right foot Quality: Patient reports No Pain. Severity: Patient states wound(s) are getting worse. Duration: Patient has had the wound for > 10 months prior to seeking treatment at the wound center Context: The wound would happen gradually Modifying Factors: Patient wound(s)/ulcer(s) are worsening due to : nonhealing nature in spite of various medications and procedures by the podiatrist Dr. Earleen [name] HPI Description: 01/22/2023 [name] is [name] 71 year old male With [name] past medical history of insulin-dependent type 2 diabetes with last hemoglobin 123456 of 7.9 complicated by peripheral neuropathy, essential tremors, and OSA that presents the clinic for [name] 3-55-monthhistory of nonhealing ulcer to the plantar aspect of his left foot. He is not sure how it started but  over time has progressively become larger. He is not using any offloading device. He has been placing [name] Band-Aid with antibiotic ointment to the wound bed. He has been following with Dr. WEarleen Newportfor this issue, podiatry. He last had an x-ray on 12/05/2022 that showed no definitive cortical destruction suggesting osteomyelitis. He currently denies systemic signs of infection. 3/7; patient presents for follow-up. He has been using Hydrofera Blue to  the wound bed. He has been using his surgical shoe with peg assist for offloading. He has been taking his oral antibiotics. He has no issues or complaints today. Electronic Signature(s) Signed: 01/29/2023 4:58:12 PM By: Joshua Shan DO Entered By: Joshua [name] on 01/29/2023 15:50:39 -------------------------------------------------------------------------------- Physical Exam Details Patient Name: Date of Service: [name]. 01/29/2023 1:15 PM Medical Record Number: [id number] Patient Account Number: [id number] Date of Birth/Sex: Treating RN: 01/24/1951 (72 y.o. M) Primary Care Provider: Shon [name] Other Clinician: Referring Provider: Treating Provider/Extender: Joshua [name] in Treatment: 1 Constitutional respirations regular, non-labored and within target range for patient.. Cardiovascular 2+ dorsalis pedis/posterior tibialis pulses. Psychiatric pleasant and cooperative. Notes Left foot: T the plantar/medial aspect there is an open wound with granulation tissue, nonviable tissue and callus. No increased warmth, erythema or purulent o drainage. Electronic Signature(s) Signed: 01/29/2023 4:58:12 PM By: Joshua Shan DO Entered By: Joshua [name] on 01/29/2023 15:51:36 -------------------------------------------------------------------------------- Physician Orders Details Patient Name: Date of Service: Joshua [name]. 01/29/2023 1:15 PM Medical Record Number: [id number] Patient Account Number:  [id number] Date of Birth/Sex: Treating RN: 11/30/1950 (72 y.o. Joshua [name], Joshua [name] Primary Care Provider: Shon [name] Other Clinician: Referring Provider: Treating Provider/Extender: Joshua [name] ([id number]) [id number] Page 3 of 9 Weeks in Treatment: 1 Verbal / Phone Orders: No Diagnosis Coding Follow-up Appointments ppointment in 1 week. - w/ Dr. Heber Pinal and Allayne Butcher Rm # 9 Thursday 02/05/23 @ 11:00 Return [name] Anesthetic (In clinic) Topical Lidocaine 5% applied to wound bed Bathing/ Shower/ Hygiene May shower with protection but do not get wound dressing(s) wet. Protect dressing(s) with water repellant cover (for example, large plastic bag) or [name] cast cover and may then take shower. Edema Control - Lymphedema / SCD / Other Elevate legs to the level of the heart or above for 30 minutes daily and/or when sitting for 3-4 times [name] day throughout the day. Avoid standing for long periods of time. Off-Loading Open toe surgical shoe to: - w/ peg assist Other: - Keep pressure off bottom of foot- Wound Treatment Wound #4 - Foot Wound Laterality: Left Cleanser: Vashe 5.8 (oz) (Generic) 1 x Per Day/15 Days Discharge Instructions: Cleanse the wound with Vashe prior to applying [name] clean dressing using gauze sponges, not tissue or cotton balls. Prim Dressing: Hydrofera Blue Ready Transfer Foam, 4x5 (in/in) (Generic) 1 x Per Day/15 Days ary Discharge Instructions: Apply to wound bed as instructed Prim Dressing: MediHoney Gel, tube 1.5 (oz) 1 x Per Day/15 Days ary Discharge Instructions: Apply to wound bed as instructed Secondary Dressing: Optifoam Non-Adhesive Dressing, 4x4 in (Generic) 1 x Per Day/15 Days Discharge Instructions: Apply foam donut over primary dressing as directed. Secondary Dressing: Woven Gauze Sponge, Non-Sterile 4x4 in (Generic) 1 x Per Day/15 Days Discharge Instructions: Apply over primary dressing as directed. Secured  With: [employer], 4.5x3.1 (in/yd) (Generic) 1 x Per Day/15 Days Discharge Instructions: Secure with Kerlix as directed. Secured With: 60M Medipore H Soft Cloth Surgical T ape, 4 x 10 (in/yd) (Generic) 1 x Per Day/15 Days Discharge Instructions: Secure with tape as directed. Secured With: [employer] Size 5, 10 (yds) (Generic) 1 x Per Day/15 Days Electronic Signature(s) Signed: 01/29/2023 4:58:12 PM By: Joshua Shan DO Entered By: Joshua [name] on 01/29/2023 15:51:47 -------------------------------------------------------------------------------- Problem List Details Patient Name: Date of Service: Joshua [name]. 01/29/2023 1:15 PM Medical Record Number: [id number] Patient Account Number: [id number] Date of Birth/Sex: Treating RN:  1951/06/19 (72 y.o. M) Primary Care Provider: Shon [name] Other Clinician: Referring Provider: Treating Provider/Extender: Joshua [name] in Treatment: 1 Active Problems ICD-10 Encounter Code Description Active Date MDM Diagnosis [id number] Non-pressure chronic ulcer of other part of left foot with fat layer exposed 01/22/2023 No Yes OLAV, ZAWADA [name] ([id number]) [phone].pdf Page 4 of 9 E11.621 Type 2 diabetes mellitus with foot ulcer 01/22/2023 No Yes G25.0 Essential tremor 01/22/2023 No Yes Inactive Problems Resolved Problems Electronic Signature(s) Signed: 01/29/2023 4:58:12 PM By: Joshua Shan DO Entered By: Joshua [name] on 01/29/2023 15:47:45 -------------------------------------------------------------------------------- Progress Note Details Patient Name: Date of Service: [name]. 01/29/2023 1:15 PM Medical Record Number: [id number] Patient Account Number: [id number] Date of Birth/Sex: Treating RN: Apr 17, 1951 (72 y.o. M) Primary Care Provider: Shon [name] Other Clinician: Referring Provider: Treating Provider/Extender: Joshua [name] in Treatment:  1 Subjective Chief Complaint Information obtained from Patient 01/22/2023; left plantar diabetic foot ulcer History of Present Illness (HPI) The following HPI elements were documented for the patient's wound: Location: left foot was the initial wound and then he has one on the right foot Quality: Patient reports No Pain. Severity: Patient states wound(s) are getting worse. Duration: Patient has had the wound for > 10 months prior to seeking treatment at the wound center Context: The wound would happen gradually Modifying Factors: Patient wound(s)/ulcer(s) are worsening due to : nonhealing nature in spite of various medications and procedures by the podiatrist Dr. Earleen [name] 01/22/2023 Mr. Syer Verhoef is [name] With [name] past medical history of insulin-dependent type 2 diabetes with last hemoglobin 123456 of 7.9 complicated by peripheral neuropathy, essential tremors, and OSA that presents the clinic for [name] 3-80-monthhistory of nonhealing ulcer to the plantar aspect of his left foot. He is not sure how it started but over time has progressively become larger. He is not using any offloading device. He has been placing [name] Band-Aid with antibiotic ointment to the wound bed. He has been following with Dr. WEarleen Newportfor this issue, podiatry. He last had an x-ray on 12/05/2022 that showed no definitive cortical destruction suggesting osteomyelitis. He currently denies systemic signs of infection. 3/7; patient presents for follow-up. He has been using Hydrofera Blue to the wound bed. He has been using his surgical shoe with peg assist for offloading. He has been taking his oral antibiotics. He has no issues or complaints today. Patient History Information obtained from Patient. Family History Diabetes - Maternal Grandparents. Social History Former smoker, Alcohol Use - Moderate - 2 drinks weekly, Drug Use - No History, Caffeine Use - Daily. Medical History Eyes Patient has history of Cataracts -  extraction bilat Denies history of Glaucoma, Optic Neuritis Ear/Nose/Mouth/Throat Denies history of Chronic sinus problems/congestion, Middle ear problems Hematologic/Lymphatic Denies history of Anemia, Hemophilia, Human Immunodeficiency Virus, Lymphedema, Sickle Cell Disease Respiratory Denies history of Aspiration, Asthma, Chronic Obstructive Pulmonary Disease (COPD), Pneumothorax, Sleep Apnea, Tuberculosis Cardiovascular Patient has history of Hypertension Denies history of Angina, Arrhythmia, Congestive Heart Failure, Coronary Artery Disease, Deep Vein Thrombosis, Hypotension, Myocardial Infarction, Peripheral Arterial Disease, Peripheral Venous Disease, Phlebitis, Vasculitis Gastrointestinal Denies history of Cirrhosis , Colitis, Crohnoos, Hepatitis [name], Hepatitis B, Hepatitis C Endocrine Joshua [name], Joshua [name] ([id number] Page 5 of 9 Patient has history of Type II Diabetes Denies history of Type I Diabetes Genitourinary Denies history of End Stage Renal Disease Immunological Denies history of Lupus Erythematosus, Raynaudoos, Scleroderma Integumentary (Skin) Denies history of History of Burn Musculoskeletal Denies history  of Gout, Rheumatoid Arthritis, Osteoarthritis, Osteomyelitis Neurologic Patient has history of Neuropathy Denies history of Dementia, Quadriplegia, Paraplegia, Seizure Disorder Oncologic Denies history of Received Chemotherapy, Received Radiation Psychiatric Denies history of Anorexia/bulimia, Confinement Anxiety Hospitalization/Surgery History - appendectomy. - tonsillectomy. - vasectomy. - nasal septum surgery. Medical [name] Surgical History Notes nd Constitutional Symptoms (General Health) arthro knee surgery Cardiovascular h/o CVA , Neurologic central tremors Oncologic h/o melanoma Objective Constitutional respirations regular, non-labored and within target range for patient.. Vitals Time Taken: 12:53 PM,  Temperature: 98.1 F, Pulse: 73 bpm, Respiratory Rate: 18 breaths/min, Blood Pressure: 124/81 mmHg. Cardiovascular 2+ dorsalis pedis/posterior tibialis pulses. Psychiatric pleasant and cooperative. General Notes: Left foot: T the plantar/medial aspect there is an open wound with granulation tissue, nonviable tissue and callus. No increased warmth, o erythema or purulent drainage. Integumentary (Hair, Skin) Wound #4 status is Open. Original cause of wound was Gradually Appeared. The date acquired was: 09/24/2022. The wound has been in treatment 1 weeks. The wound is located on the Left Foot. The wound measures 0.4cm length x 0.5cm width x 0.2cm depth; 0.157cm^2 area and 0.031cm^3 volume. There is Fat Layer (Subcutaneous Tissue) exposed. There is no tunneling or undermining noted. There is [name] medium amount of serosanguineous drainage noted. Foul odor after cleansing was noted. The wound margin is distinct with the outline attached to the wound base. There is large (67-100%) red, pink granulation within the wound bed. There is no necrotic tissue within the wound bed. The periwound skin appearance exhibited: Callus. The periwound skin appearance did not exhibit: Crepitus, Excoriation, Induration, Rash, Scarring, Dry/Scaly, Maceration, Atrophie Blanche, Cyanosis, Ecchymosis, Hemosiderin Staining, Mottled, Pallor, Rubor, Erythema. Periwound temperature was noted as No Abnormality. The periwound has tenderness on palpation. Assessment Active Problems ICD-10 Non-pressure chronic ulcer of other part of left foot with fat layer exposed Type 2 diabetes mellitus with foot ulcer Essential tremor Patient's wound has shown improvement in size and appearance since last clinic visit. I debrided nonviable tissue. I recommended continuing Hydrofera Blue and adding Medihoney. No signs of infection. I recommended finishing oral antibiotics but does not need to extend. Continue aggressive offloading with surgical  shoe and peg assist. Procedures Joshua [name], Joshua [name] ([id number]) [id number] Page 6 of 9 Wound #4 Pre-procedure diagnosis of Wound #4 is [name] Diabetic Wound/Ulcer of the Lower Extremity located on the Left Foot .Severity of Tissue Pre Debridement is: Fat layer exposed. There was [name] Excisional Skin/Subcutaneous Tissue Debridement with [name] total area of 0.2 sq cm performed by Joshua Shan, DO. With the following instrument(s): Curette to remove Viable and Non-Viable tissue/material. Material removed includes Callus, Subcutaneous Tissue, and Slough after achieving pain control using Lidocaine. No specimens were taken. [name] time out was conducted at 13:37, prior to the start of the procedure. [name] Minimum amount of bleeding was controlled with Pressure. The procedure was tolerated well with [name] pain level of 0 throughout and [name] pain level of 0 following the procedure. Post Debridement Measurements: 0.4cm length x 0.5cm width x 0.2cm depth; 0.031cm^3 volume. Character of Wound/Ulcer Post Debridement is improved. Severity of Tissue Post Debridement is: Fat layer exposed. Post procedure Diagnosis Wound #4: Same as Pre-Procedure Plan Follow-up Appointments: Return Appointment in 1 week. - w/ Dr. Heber [place] and Allayne Butcher Rm # 9 Thursday 02/05/23 @ 11:00 Anesthetic: (In clinic) Topical Lidocaine 5% applied to wound bed Bathing/ Shower/ Hygiene: May shower with protection but do not get wound dressing(s) wet. Protect dressing(s) with water repellant cover (for example, large plastic bag) or [name] cast  cover and may then take shower. Edema Control - Lymphedema / SCD / Other: Elevate legs to the level of the heart or above for 30 minutes daily and/or when sitting for 3-4 times [name] day throughout the day. Avoid standing for long periods of time. Off-Loading: Open toe surgical shoe to: - w/ peg assist Other: - Keep pressure off bottom of foot- WOUND #4: - Foot Wound Laterality: Left Cleanser: Vashe 5.8  (oz) (Generic) 1 x Per Day/15 Days Discharge Instructions: Cleanse the wound with Vashe prior to applying [name] clean dressing using gauze sponges, not tissue or cotton balls. Prim Dressing: Hydrofera Blue Ready Transfer Foam, 4x5 (in/in) (Generic) 1 x Per Day/15 Days ary Discharge Instructions: Apply to wound bed as instructed Prim Dressing: MediHoney Gel, tube 1.5 (oz) 1 x Per Day/15 Days ary Discharge Instructions: Apply to wound bed as instructed Secondary Dressing: Optifoam Non-Adhesive Dressing, 4x4 in (Generic) 1 x Per Day/15 Days Discharge Instructions: Apply foam donut over primary dressing as directed. Secondary Dressing: Woven Gauze Sponge, Non-Sterile 4x4 in (Generic) 1 x Per Day/15 Days Discharge Instructions: Apply over primary dressing as directed. Secured With: [employer], 4.5x3.1 (in/yd) (Generic) 1 x Per Day/15 Days Discharge Instructions: Secure with Kerlix as directed. Secured With: 55M Medipore H Soft Cloth Surgical T ape, 4 x 10 (in/yd) (Generic) 1 x Per Day/15 Days Discharge Instructions: Secure with tape as directed. Secured With: [employer] Net Size 5, 10 (yds) (Generic) 1 x Per Day/15 Days 1. Finish oral antibiotics 2. Surgical shoe with peg assist 3. Hydrofera Blue and Medihoney 4. Follow-up in 1 week Electronic Signature(s) Signed: 01/29/2023 4:58:12 PM By: Joshua Shan DO Entered By: Joshua [name] on 01/29/2023 16:30:21 -------------------------------------------------------------------------------- HxROS Details Patient Name: Date of Service: [name]. 01/29/2023 1:15 PM Medical Record Number: [id number] Patient Account Number: [id number] Date of Birth/Sex: Treating RN: 1951/10/12 (72 y.o. M) Primary Care Provider: Shon [name] Other Clinician: Referring Provider: Treating Provider/Extender: Joshua [name] in Treatment: 1 Information Obtained From Patient Constitutional Symptoms ([place]) Medical  History: Past Medical History Notes: arthro knee surgery Joshua [name], Joshua [name] ([id number]) [id number] Page 7 of 9 Eyes Medical History: Positive for: Cataracts - extraction bilat Negative for: Glaucoma; Optic Neuritis Ear/Nose/Mouth/Throat Medical History: Negative for: Chronic sinus problems/congestion; Middle ear problems Hematologic/Lymphatic Medical History: Negative for: Anemia; Hemophilia; Human Immunodeficiency Virus; Lymphedema; Sickle Cell Disease Respiratory Medical History: Negative for: Aspiration; Asthma; Chronic Obstructive Pulmonary Disease (COPD); Pneumothorax; Sleep Apnea; Tuberculosis Cardiovascular Medical History: Positive for: Hypertension Negative for: Angina; Arrhythmia; Congestive Heart Failure; Coronary Artery Disease; Deep Vein Thrombosis; Hypotension; Myocardial Infarction; Peripheral Arterial Disease; Peripheral Venous Disease; Phlebitis; Vasculitis Past Medical History Notes: h/o CVA , Gastrointestinal Medical History: Negative for: Cirrhosis ; Colitis; Crohns; Hepatitis [name]; Hepatitis B; Hepatitis C Endocrine Medical History: Positive for: Type II Diabetes Negative for: Type I Diabetes Time with diabetes: since 2010 Treated with: Insulin Blood sugar tested every day: Yes Tested : daily Blood sugar testing results: Breakfast: 142 Genitourinary Medical History: Negative for: End Stage Renal Disease Immunological Medical History: Negative for: Lupus Erythematosus; Raynauds; Scleroderma Integumentary (Skin) Medical History: Negative for: History of Burn Musculoskeletal Medical History: Negative for: Gout; Rheumatoid Arthritis; Osteoarthritis; Osteomyelitis Neurologic Medical History: Positive for: Neuropathy Negative for: Dementia; Quadriplegia; Paraplegia; Seizure Disorder Past Medical History Notes: central tremors Oncologic Medical History: Negative for: Received Chemotherapy; Received Radiation Past Medical  History Notes: h/o melanoma Joshua [name], Joshua [name] ([id number]) [id number] Page 8 of 9 Psychiatric Medical History: Negative for:  Anorexia/bulimia; Confinement Anxiety HBO Extended History Items Eyes: Cataracts Immunizations Pneumococcal Vaccine: Received Pneumococcal Vaccination: No Immunization Notes: unknown Implantable Devices No devices added Hospitalization / Surgery History Type of Hospitalization/Surgery appendectomy tonsillectomy vasectomy nasal septum surgery Family and Social History Diabetes: Yes - Maternal Grandparents; Former smoker; Alcohol Use: Moderate - 2 drinks weekly; Drug Use: No History; Caffeine Use: Daily; Financial Concerns: No; Food, Clothing or Shelter Needs: No; Support System Lacking: No; Transportation Concerns: No Electronic Signature(s) Signed: 01/29/2023 4:58:12 PM By: Joshua Shan DO Entered By: Joshua [name] on 01/29/2023 15:50:45 -------------------------------------------------------------------------------- SuperBill Details Patient Name: Date of Service: Joshua [name]. 01/29/2023 Medical Record Number: [id number] Patient Account Number: [id number] Date of Birth/Sex: Treating RN: 06/14/1951 (72 y.o. Joshua [name], Joshua [name] Primary Care Provider: Shon [name] Other Clinician: Referring Provider: Treating Provider/Extender: Joshua [name] in Treatment: 1 Diagnosis Coding ICD-10 Codes Code Description [phone] Non-pressure chronic ulcer of other part of left foot with fat layer exposed E11.621 Type 2 diabetes mellitus with foot ulcer G25.0 Essential tremor Facility Procedures : CPT4 Code: [id number] Description: [id number] - DEB SUBQ TISSUE 20 SQ CM/< ICD-10 Diagnosis Description L97.522 Non-pressure chronic ulcer of other part of left foot with fat layer exposed E11.621 Type 2 diabetes mellitus with foot ulcer Modifier: Quantity: 1 Physician Procedures Electronic Signature(s) Signed:  01/29/2023 4:58:12 PM By: Joshua Shan DO Entered By: Joshua [name] on 01/29/2023 16:30:34

## 2023-01-31 NOTE — Progress Notes (Signed)
DAMEN, GILE [name] ([id number]) [id number] Page 1 of 7 Visit Report for 01/29/2023 Arrival Information Details Patient Name: Date of Service: [name] 01/29/2023 1:15 PM Medical Record Number: [id number] Patient Account Number: [id number] Date of Birth/Sex: Treating RN: 11-08-1951 (72 y.o. M) Primary Care Joshua [name]: Joshua [name] Other Clinician: Referring Joshua [name]: Treating Joshua [name]/Extender: Joshua [name] in Treatment: 1 Visit Information History Since Last Visit Added or deleted any medications: No Patient Arrived: Joshua [name] Any new allergies or adverse reactions: No Arrival Time: 12:52 Had [name] fall or experienced change in No Accompanied By: self activities of daily living that may affect Transfer Assistance: None risk of falls: Patient Identification Verified: Yes Signs or symptoms of abuse/neglect since last visito No Secondary Verification Process Completed: Yes Hospitalized since last visit: No Patient Requires Transmission-Based Precautions: No Implantable device outside of the clinic excluding No Patient Has Alerts: Yes cellular tissue based products placed in the center since last visit: Has Dressing in Place as Prescribed: No Pain Present Now: No Electronic Signature(s) Signed: 01/29/2023 3:53:55 PM By: Erenest Blank Entered By: Erenest Blank on 01/29/2023 13:03:46 -------------------------------------------------------------------------------- Encounter Discharge Information Details Patient Name: Date of Service: Joshua Mane [name]. 01/29/2023 1:15 PM Medical Record Number: [id number] Patient Account Number: [id number] Date of Birth/Sex: Treating RN: 03-Nov-1951 (72 y.o. [name], [name] Primary Care Shameek Nyquist: Joshua [name] Other Clinician: Referring Tammela Bales: Treating Jonhatan Hearty/Extender: Joshua [name] in Treatment: 1 Encounter Discharge Information Items Post Procedure Vitals Discharge  Condition: Stable Temperature (F): 98.7 Ambulatory Status: Ambulatory Pulse (bpm): 74 Discharge Destination: Home Respiratory Rate (breaths/min): 17 Transportation: Private Auto Blood Pressure (mmHg): 120/80 Accompanied By: self Schedule Follow-up Appointment: Yes Clinical Summary of Care: Patient Declined Electronic Signature(s) Signed: 01/29/2023 4:33:02 PM By: Rhae Hammock RN Entered By: Rhae Hammock on 01/29/2023 13:43:27 -------------------------------------------------------------------------------- Lower Extremity Assessment Details Patient Name: Date of Service: Joshua Mane [name]. 01/29/2023 1:15 PM Medical Record Number: [id number] Patient Account Number: [id number] Date of Birth/Sex: Treating RN: 29-Dec-1950 (72 y.o. M) Primary Care Azad Calame: Joshua [name] Other Clinician: Referring Cherylene Ferrufino: Treating Armanii Pressnell/Extender: Joshua [name] in Treatment: 1 Edema Assessment Assessed: [Left: No] [Right: No] G[LeftGOWTHAM, Joshua [name] ([id number] [[id number] Page 2 of 7] [Left: Edema] [Right: :] Calf Left: Right: Point of Measurement: 40 cm From Medial Instep 40 cm Ankle Left: Right: Point of Measurement: 11 cm From Medial Instep 26 cm Electronic Signature(s) Signed: 01/29/2023 3:53:55 PM By: Erenest Blank Entered By: Erenest Blank on 01/29/2023 12:57:56 -------------------------------------------------------------------------------- Multi Wound Chart Details Patient Name: Date of Service: Joshua Mane [name]. 01/29/2023 1:15 PM Medical Record Number: [id number] Patient Account Number: [id number] Date of Birth/Sex: Treating RN: [birth year] (72 y.o. M) Primary Care Sharia Averitt: Joshua [name] Other Clinician: Referring Bridney Guadarrama: Treating Jessicah Croll/Extender: Joshua [name] in Treatment: 1 Vital Signs Height(in): Pulse(bpm): 16 Weight(lbs): Blood Pressure(mmHg): 124/81 Body Mass  Index(BMI): Temperature(F): 98.1 Respiratory Rate(breaths/min): 18 [4:Photos:] [[name]:[name]] Left Foot [name] [name] Wound Location: Gradually Appeared [name] [name] Wounding Event: Diabetic Wound/Ulcer of the Lower [name] [name] Primary Etiology: Extremity Cataracts, Hypertension, Type II [name] [name] Comorbid History: Diabetes, Neuropathy 09/24/2022 [name] [name] Date Acquired: 1 [name] [name] Weeks of Treatment: Open [name] [name] Wound Status: No [name] [name] Wound Recurrence: Yes [name] [name] Pending [name] mputation on Presentation: 0.4x0.5x0.2 [name] [name] Measurements L x W x D (cm) 0.157 [name] [name] [name] (cm) : rea 0.031 [name] [name] Volume (cm) : 19.90% [name] [name] % Reduction in [name] rea:  77.40% [name] [name] % Reduction in Volume: Grade 1 [name] [name] Classification: Medium [name] [name] Exudate [name] mount: Serosanguineous [name] [name] Exudate Type: red, brown [name] [name] Exudate Color: Yes [name] [name] Foul Odor [name] Cleansing: fter No [name] [name] Odor [name] nticipated Due to Product Use: Distinct, outline attached [name] [name] Wound Margin: Large (67-100%) [name] [name] Granulation [name] mount: Red, Pink [name] [name] Granulation Quality: None Present (0%) [name] [name] Necrotic [name] mount: Fat Layer (Subcutaneous Tissue): Yes [name] [name] Exposed Structures: Fascia: No Tendon: No Muscle: No Joint: No Joshua [name], Joshua [name] ([id number]) [phone].pdf Page 3 of 7 Bone: No None [name] [name] Epithelialization: Debridement - Excisional [name] [name] Debridement: 13:37 [name] [name] Pre-procedure Verification/Time Out Taken: Lidocaine [name] [name] Pain Control: Callus, Subcutaneous, Slough [name] [name] Tissue Debrided: Skin/Subcutaneous Tissue [name] [name] Level: 0.2 [name] [name] Debridement [name] (sq cm): rea Curette [name] [name] Instrument: Minimum [name] [name] Bleeding: Pressure [name] [name] Hemostasis [name] chieved: 0 [name] [name] Procedural Pain: 0 [name] [name] Post Procedural Pain: Procedure was tolerated well [name] [name] Debridement Treatment Response: 0.4x0.5x0.2 [name] [name] Post Debridement Measurements L x W x D  (cm) 0.031 [name] [name] Post Debridement Volume: (cm) Callus: Yes [name] [name] Periwound Skin Texture: Excoriation: No Induration: No Crepitus: No Rash: No Scarring: No Maceration: No [name] [name] Periwound Skin Moisture: Dry/Scaly: No Atrophie Blanche: No [name] [name] Periwound Skin Color: Cyanosis: No Ecchymosis: No Erythema: No Hemosiderin Staining: No Mottled: No Pallor: No Rubor: No No Abnormality [name] [name] Temperature: Yes [name] [name] Tenderness on Palpation: Debridement [name] [name] Procedures Performed: Treatment Notes Wound #4 (Foot) Wound Laterality: Left Cleanser Vashe 5.8 (oz) Discharge Instruction: Cleanse the wound with Vashe prior to applying [name] clean dressing using gauze sponges, not tissue or cotton balls. Peri-Wound Care Topical Primary Dressing Hydrofera Blue Ready Transfer Foam, 4x5 (in/in) Discharge Instruction: Apply to wound bed as instructed MediHoney Gel, tube 1.5 (oz) Discharge Instruction: Apply to wound bed as instructed Secondary Dressing Optifoam Non-Adhesive Dressing, 4x4 in Discharge Instruction: Apply foam donut over primary dressing as directed. Woven Gauze Sponge, Non-Sterile 4x4 in Discharge Instruction: Apply over primary dressing as directed. Secured With [employer], 4.5x3.1 (in/yd) Discharge Instruction: Secure with Kerlix as directed. 32M Medipore H Soft Cloth Surgical T ape, 4 x 10 (in/yd) Discharge Instruction: Secure with tape as directed. Stretch Net Size 5, 10 (yds) Compression Wrap Compression Stockings Add-Ons Electronic Signature(s) Signed: 01/29/2023 4:58:12 PM By: Kalman Shan DO Entered By: Kalman Shan on 01/29/2023 15:47:50 Joshua [name] ([id number]) [phone].pdf Page 4 of 7 -------------------------------------------------------------------------------- Multi-Disciplinary Care Plan Details Patient Name: Date of Service: [name] 01/29/2023 1:15 PM Medical Record Number:  [id number] Patient Account Number: [id number] Date of Birth/Sex: Treating RN: Apr 24, 1951 (72 y.o. [name], [name] Primary Care Eliora Nienhuis: Joshua [name] Other Clinician: Referring Azana Kiesler: Treating Noelly Lasseigne/Extender: Joshua [name] in Treatment: 1 Active Inactive Wound/Skin Impairment Nursing Diagnoses: Impaired tissue integrity Knowledge deficit related to ulceration/compromised skin integrity Goals: Patient will have [name] decrease in wound volume by X% from date: (specify in notes) Date Initiated: 01/22/2023 Target Resolution Date: 01/30/2023 Goal Status: Active Patient/caregiver will verbalize understanding of skin care regimen Date Initiated: 01/22/2023 Target Resolution Date: 01/29/2023 Goal Status: Active Ulcer/skin breakdown will have [name] volume reduction of 30% by week 4 Date Initiated: 01/22/2023 Target Resolution Date: 01/30/2023 Goal Status: Active Interventions: Assess patient/caregiver ability to obtain necessary supplies Assess patient/caregiver ability to perform ulcer/skin care regimen upon admission and as needed Assess ulceration(s) every visit Notes: Electronic Signature(s) Signed:  01/29/2023 4:33:02 PM By: Rhae Hammock RN Entered By: Rhae Hammock on 01/29/2023 13:30:30 -------------------------------------------------------------------------------- Pain Assessment Details Patient Name: Date of Service: Joshua Mane [name]. 01/29/2023 1:15 PM Medical Record Number: [id number] Patient Account Number: [id number] Date of Birth/Sex: Treating RN: 09-13-1951 (72 y.o. M) Primary Care Levander Katzenstein: Joshua [name] Other Clinician: Referring Marybell Robards: Treating Kaitelyn Jamison/Extender: Joshua [name] in Treatment: 1 Active Problems Location of Pain Severity and Description of Pain Patient Has Paino No Site Locations Joshua [name], Joshua [name] ([id number]) [id number] Page 5 of 7 Pain Management and Medication Current Pain  Management: Electronic Signature(s) Signed: 01/29/2023 3:53:55 PM By: Erenest Blank Entered By: Erenest Blank on 01/29/2023 12:53:24 -------------------------------------------------------------------------------- Patient/Caregiver Education Details Patient Name: Date of Service: [name] 3/7/2024andnbsp1:15 PM Medical Record Number: [id number] Patient Account Number: [id number] Date of Birth/Gender: Treating RN: 04/21/1951 (72 y.o. Erie Noe Primary Care Physician: Joshua [name] Other Clinician: Referring Physician: Treating Physician/Extender: Joshua [name] in Treatment: 1 Education Assessment Education Provided To: Patient Education Topics Provided Wound/Skin Impairment: Methods: Explain/Verbal Responses: Reinforcements needed, State content correctly Electronic Signature(s) Signed: 01/29/2023 4:33:02 PM By: Rhae Hammock RN Entered By: Rhae Hammock on 01/29/2023 13:30:44 -------------------------------------------------------------------------------- Wound Assessment Details Patient Name: Date of Service: Joshua Mane [name]. 01/29/2023 1:15 PM Medical Record Number: [id number] Patient Account Number: [id number] Date of Birth/Sex: Treating RN: 08-10-1951 (72 y.o. M) Primary Care Sesilia Poucher: Joshua [name] Other Clinician: Referring Monserrath Junio: Treating Caryle Helgeson/Extender: Joshua [name] in Treatment: 1 Wound Status Wound Number: 4 Primary Etiology: Diabetic Wound/Ulcer of the Lower Extremity Wound Location: Left Foot Wound Status: Open Wounding Event: Gradually Appeared Comorbid History: Cataracts, Hypertension, Type II Diabetes, Neuropathy Date Acquired: 09/24/2022 Weeks Of Treatment: 1 Clustered Wound: No Joshua [name], Joshua [name] ([id number]) [phone].pdf Page 6 of 7 Pending Amputation On Presentation Photos Wound Measurements Length: (cm) 0.4 Width: (cm) 0.5 Depth: (cm) 0.2 Area: (cm)  0.157 Volume: (cm) 0.031 % Reduction in Area: 19.9% % Reduction in Volume: 77.4% Epithelialization: None Tunneling: No Undermining: No Wound Description Classification: Grade 1 Wound Margin: Distinct, outline attached Exudate Amount: Medium Exudate Type: Serosanguineous Exudate Color: red, brown Foul Odor After Cleansing: Yes Due to Product Use: No Slough/Fibrino Yes Wound Bed Granulation Amount: Large (67-100%) Exposed Structure Granulation Quality: Red, Pink Fascia Exposed: No Necrotic Amount: None Present (0%) Fat Layer (Subcutaneous Tissue) Exposed: Yes Tendon Exposed: No Muscle Exposed: No Joint Exposed: No Bone Exposed: No Periwound Skin Texture Texture Color No Abnormalities Noted: No No Abnormalities Noted: No Callus: Yes Atrophie Blanche: No Crepitus: No Cyanosis: No Excoriation: No Ecchymosis: No Induration: No Erythema: No Rash: No Hemosiderin Staining: No Scarring: No Mottled: No Pallor: No Moisture Rubor: No No Abnormalities Noted: No Dry / Scaly: No Temperature / Pain Maceration: No Temperature: No Abnormality Tenderness on Palpation: Yes Treatment Notes Wound #4 (Foot) Wound Laterality: Left Cleanser Vashe 5.8 (oz) Discharge Instruction: Cleanse the wound with Vashe prior to applying [name] clean dressing using gauze sponges, not tissue or cotton balls. Peri-Wound Care Topical Primary Dressing Hydrofera Blue Ready Transfer Foam, 4x5 (in/in) Discharge Instruction: Apply to wound bed as instructed MediHoney Gel, tube 1.5 (oz) Discharge Instruction: Apply to wound bed as instructed Secondary Dressing Optifoam Non-Adhesive Dressing, 4x4 in Joshua [name], Joshua [name] ([id number]) [phone].pdf Page 7 of 7 Discharge Instruction: Apply foam donut over primary dressing as directed. Woven Gauze Sponge, Non-Sterile 4x4 in Discharge Instruction: Apply over primary dressing as directed. Secured With [employer], 4.5x3.1  (in/yd)  Discharge Instruction: Secure with Kerlix as directed. 60M Medipore H Soft Cloth Surgical T ape, 4 x 10 (in/yd) Discharge Instruction: Secure with tape as directed. Stretch Net Size 5, 10 (yds) Compression Wrap Compression Stockings Add-Ons Electronic Signature(s) Signed: 01/29/2023 3:53:55 PM By: Erenest Blank Entered By: Erenest Blank on 01/29/2023 12:59:06 -------------------------------------------------------------------------------- Vitals Details Patient Name: Date of Service: Joshua Mane [name]. 01/29/2023 1:15 PM Medical Record Number: [id number] Patient Account Number: [id number] Date of Birth/Sex: Treating RN: 1951-01-21 (72 y.o. M) Primary Care Macall Mccroskey: Joshua [name] Other Clinician: Referring Jazaria Jarecki: Treating Lourine Alberico/Extender: Joshua [name] in Treatment: 1 Vital Signs Time Taken: 12:53 Temperature (F): 98.1 Pulse (bpm): 73 Respiratory Rate (breaths/min): 18 Blood Pressure (mmHg): 124/81 Reference Range: 80 - 120 mg / dl Electronic Signature(s) Signed: 01/29/2023 3:53:55 PM By: Erenest Blank Entered By: Erenest Blank on 01/29/2023 12:53:18

## 2023-02-01 DIAGNOSIS — E1121 Type 2 diabetes mellitus with diabetic nephropathy: Secondary | ICD-10-CM | POA: Diagnosis not present

## 2023-02-03 ENCOUNTER — Encounter (HOSPITAL_BASED_OUTPATIENT_CLINIC_OR_DEPARTMENT_OTHER): Payer: Medicare PPO | Admitting: Internal Medicine

## 2023-02-04 ENCOUNTER — Other Ambulatory Visit: Payer: Self-pay

## 2023-02-04 DIAGNOSIS — I2699 Other pulmonary embolism without acute cor pulmonale: Secondary | ICD-10-CM

## 2023-02-04 DIAGNOSIS — I639 Cerebral infarction, unspecified: Secondary | ICD-10-CM

## 2023-02-04 MED ORDER — APIXABAN 5 MG PO TABS: 5.0000 mg | ORAL_TABLET | Freq: Two times a day (BID) | ORAL | 1 refills | Status: DC

## 2023-02-04 NOTE — Telephone Encounter (Signed)
Prescription refill request for Eliquis received. Indication: CVA Last office visit: 07/30/22 ([facility])  Scr: 2.00 (01/23/23)  Age: 72 Weight: 115.2kg  Appropriate dose. Refill sent.

## 2023-02-05 ENCOUNTER — Encounter (HOSPITAL_BASED_OUTPATIENT_CLINIC_OR_DEPARTMENT_OTHER): Payer: Medicare PPO | Admitting: Internal Medicine

## 2023-02-05 DIAGNOSIS — G250 Essential tremor: Secondary | ICD-10-CM | POA: Diagnosis not present

## 2023-02-05 DIAGNOSIS — L97522 Non-pressure chronic ulcer of other part of left foot with fat layer exposed: Secondary | ICD-10-CM

## 2023-02-05 DIAGNOSIS — E11621 Type 2 diabetes mellitus with foot ulcer: Secondary | ICD-10-CM | POA: Diagnosis not present

## 2023-02-06 ENCOUNTER — Telehealth: Payer: Self-pay | Admitting: Podiatry

## 2023-02-06 NOTE — Progress Notes (Signed)
**Note Joshua-Identified via Obfuscation** KAEO, MCNAIR A ([id number]) [id number] Page 1 of 7 Visit Report for 02/05/2023 Arrival Information Details Patient Name: Date of Service: [name] 02/05/2023 11:00 A M Medical Record Number: [id number] Patient Account Number: [id number] Date of Birth/Sex: Treating RN: November 07, 1951 (72 y.o. Joshua [name], [name] Primary Care Iasia Forcier: [name] Other Clinician: Referring Alenah Sarria: Treating Mayerli Kirst/Extender: [name] in Treatment: 2 Visit Information History Since Last Visit Added or deleted any medications: No Patient Arrived: [name] Any new allergies or adverse reactions: No Arrival Time: 11:05 Had a fall or experienced change in No Accompanied By: self activities of daily living that may affect Transfer Assistance: None risk of falls: Patient Identification Verified: Yes Signs or symptoms of abuse/neglect since last visito No Secondary Verification Process Completed: Yes Hospitalized since last visit: No Patient Requires Transmission-Based Precautions: No Implantable device outside of the clinic excluding No Patient Has Alerts: Yes cellular tissue based products placed in the center since last visit: Has Dressing in Place as Prescribed: Yes Pain Present Now: No Electronic Signature(s) Signed: 02/06/2023 10:47:35 AM By: Rhae Hammock RN Entered By: Rhae Hammock on 02/05/2023 11:06:09 -------------------------------------------------------------------------------- Encounter Discharge Information Details Patient Name: Date of Service: Joshua Mane A. 02/05/2023 11:00 A M Medical Record Number: [id number] Patient Account Number: [id number] Date of Birth/Sex: Treating RN: 1951/09/21 (72 y.o. Joshua [name], [name] Primary Care Derak Schurman: [name] Other Clinician: Referring [name]: Treating [name]/Extender: [name] in Treatment: 2 Encounter Discharge Information Items Post  Procedure Vitals Discharge Condition: Stable Temperature (F): 98.7 Ambulatory Status: Walker Pulse (bpm): 74 Discharge Destination: Home Respiratory Rate (breaths/min): 17 Transportation: Private Auto Blood Pressure (mmHg): 120/80 Accompanied By: self Schedule Follow-up Appointment: Yes Clinical Summary of Care: Patient Declined Electronic Signature(s) Signed: 02/06/2023 10:47:35 AM By: Rhae Hammock RN Entered By: Rhae Hammock on 02/05/2023 12:03:39 -------------------------------------------------------------------------------- Lower Extremity Assessment Details Patient Name: Date of Service: Joshua Mane A. 02/05/2023 11:00 A M Medical Record Number: [id number] Patient Account Number: [id number] Date of Birth/Sex: Treating RN: 03-26-1951 (72 y.o. Joshua [name], [name] Primary Care Onyinyechi Huante: [name] Other Clinician: Referring [name]: Treating [name]/Extender: [name] in Treatment: 2 Edema Assessment Assessed: [name]: Yes] [name]: No] G[LeftCHIDERA, MIYASHIRO [id number] [[id number] Page 2 of 7] Edema: [Left: N] [Right: o] Calf Left: Right: Point of Measurement: 40 cm From Medial Instep 40 cm Ankle Left: Right: Point of Measurement: 11 cm From Medial Instep 26 cm Vascular Assessment Pulses: Dorsalis Pedis Palpable: [Left:Yes] Posterior Tibial Palpable: [Left:Yes] Electronic Signature(s) Signed: 02/06/2023 10:47:35 AM By: Rhae Hammock RN Entered By: Rhae Hammock on 02/05/2023 11:39:22 -------------------------------------------------------------------------------- Multi Wound Chart Details Patient Name: Date of Service: Joshua Mane A. 02/05/2023 11:00 A M Medical Record Number: [id number] Patient Account Number: [id number] Date of Birth/Sex: Treating RN: 07/18/1951 (72 y.o. M) Primary Care [name]: [name] Other Clinician: Referring Shawonda Kerce: Treating [name]/Extender:  [name] in Treatment: 2 Vital Signs Height(in): Pulse(bpm): 24 Weight(lbs): Blood Pressure(mmHg): 135/81 Body Mass Index(BMI): Temperature(F): 98.7 Respiratory Rate(breaths/min): 17 [4:Photos:] [N/A:N/A] Left Foot N/A N/A Wound Location: Gradually Appeared N/A N/A Wounding Event: Diabetic Wound/Ulcer of the Lower N/A N/A Primary Etiology: Extremity Cataracts, Hypertension, Type II N/A N/A Comorbid History: Diabetes, Neuropathy 09/24/2022 N/A N/A Date Acquired: 2 N/A N/A Weeks of Treatment: Open N/A N/A Wound Status: No N/A N/A Wound Recurrence: Yes N/A N/A Pending A mputation on Presentation: 0.3x0.3x0.2 N/A N/A Measurements L x W x D (  cm) 0.071 N/A N/A A (cm) : rea 0.014 N/A N/A Volume (cm) : 63.80% N/A N/A % Reduction in A rea: 89.80% N/A N/A % Reduction in Volume: Grade 1 N/A N/A Classification: Medium N/A N/A Exudate A mount: Serosanguineous N/A N/A Exudate Type: red, brown N/A N/A Exudate Color: Yes N/A N/A Foul Odor A Cleansing: fter No N/A N/A Odor A nticipated Due to Product [name], [name] A ([id number]) [id number] Page 3 of 7 Use: Distinct, outline attached N/A N/A Wound Margin: Large (67-100%) N/A N/A Granulation Amount: Red, Pink N/A N/A Granulation Quality: None Present (0%) N/A N/A Necrotic Amount: Fat Layer (Subcutaneous Tissue): Yes N/A N/A Exposed Structures: Fascia: No Tendon: No Muscle: No Joint: No Bone: No None N/A N/A Epithelialization: Debridement - Excisional N/A N/A Debridement: Pre-procedure Verification/Time Out 11:40 N/A N/A Taken: Lidocaine N/A N/A Pain Control: Callus, Subcutaneous, Slough N/A N/A Tissue Debrided: Skin/Subcutaneous Tissue N/A N/A Level: 0.09 N/A N/A Debridement A (sq cm): rea Curette N/A N/A Instrument: Minimum N/A N/A Bleeding: Pressure N/A N/A Hemostasis A chieved: 0 N/A N/A Procedural Pain: 0 N/A N/A Post Procedural  Pain: Procedure was tolerated well N/A N/A Debridement Treatment Response: 0.3x0.3x0.2 N/A N/A Post Debridement Measurements L x W x D (cm) 0.014 N/A N/A Post Debridement Volume: (cm) Callus: Yes N/A N/A Periwound Skin Texture: Excoriation: No Induration: No Crepitus: No Rash: No Scarring: No Maceration: No N/A N/A Periwound Skin Moisture: Dry/Scaly: No Atrophie Blanche: No N/A N/A Periwound Skin Color: Cyanosis: No Ecchymosis: No Erythema: No Hemosiderin Staining: No Mottled: No Pallor: No Rubor: No No Abnormality N/A N/A Temperature: Yes N/A N/A Tenderness on Palpation: Debridement N/A N/A Procedures Performed: Treatment Notes Electronic Signature(s) Signed: 02/05/2023 3:34:03 PM By: Kalman Shan DO Entered By: Kalman Shan on 02/05/2023 12:02:45 -------------------------------------------------------------------------------- Multi-Disciplinary Care Plan Details Patient Name: Date of Service: GO Wonda Cheng A. 02/05/2023 11:00 A M Medical Record Number: [id number] Patient Account Number: [id number] Date of Birth/Sex: Treating RN: 1951-03-20 (72 y.o. Joshua [name], [name] Primary Care Nancey Kreitz: [name] Other Clinician: Referring Katsumi Wisler: Treating Analyssa Downs/Extender: [name] in Treatment: 2 Active Inactive Wound/Skin Impairment Nursing Diagnoses: Impaired tissue integrity Knowledge deficit related to ulceration/compromised skin integrity Goals: Patient will have a decrease in wound volume by X% from date: (specify in notes) Date Initiated: 01/22/2023 Target Resolution Date: 02/21/2023 Goal Status: Active JERIMEY, WEIBLEY ([id number]) [phone].pdf Page 4 of 7 Patient/caregiver will verbalize understanding of skin care regimen Date Initiated: 01/22/2023 Target Resolution Date: 02/21/2023 Goal Status: Active Ulcer/skin breakdown will have a volume reduction of 30% by week 4 Date Initiated:  01/22/2023 Target Resolution Date: 02/21/2023 Goal Status: Active Interventions: Assess patient/caregiver ability to obtain necessary supplies Assess patient/caregiver ability to perform ulcer/skin care regimen upon admission and as needed Assess ulceration(s) every visit Notes: Electronic Signature(s) Signed: 02/06/2023 10:47:35 AM By: Rhae Hammock RN Entered By: Rhae Hammock on 02/05/2023 12:02:41 -------------------------------------------------------------------------------- Pain Assessment Details Patient Name: Date of Service: Joshua Mane A. 02/05/2023 11:00 A M Medical Record Number: [id number] Patient Account Number: [id number] Date of Birth/Sex: Treating RN: [birth year] (72 y.o. Joshua [name], [name] Primary Care Manasvi Dickard: [name] Other Clinician: Referring Montrez Marietta: Treating Jshaun Abernathy/Extender: [name] in Treatment: 2 Active Problems Location of Pain Severity and Description of Pain Patient Has Paino No Site Locations Pain Management and Medication Current Pain Management: Electronic Signature(s) Signed: 02/06/2023 10:47:35 AM By: Rhae Hammock RN Entered By: Rhae Hammock on 02/05/2023 11:39:06 -------------------------------------------------------------------------------- Patient/Caregiver Education Details Patient Name: Date  of Service: [name] 3/14/2024andnbsp11:00 A M Medical Record Number: [id number] Patient Account Number: [id number] Date of Birth/Gender: Treating RN: 1951/04/11 (72 y.o. Erie Noe Primary Care Physician: [name] Other Clinician: Referring Physician: Treating Physician/Extender: [name] in Treatment: 2 CHOU, BELDIN ([id number]) [id number] Page 5 of 7 Education Assessment Education Provided To: Patient Education Topics Provided Wound/Skin Impairment: Methods: Explain/Verbal Responses: Reinforcements needed,  State content correctly Electronic Signature(s) Signed: 02/06/2023 10:47:35 AM By: Rhae Hammock RN Entered By: Rhae Hammock on 02/05/2023 12:02:52 -------------------------------------------------------------------------------- Wound Assessment Details Patient Name: Date of Service: Joshua Mane A. 02/05/2023 11:00 A M Medical Record Number: [id number] Patient Account Number: [id number] Date of Birth/Sex: Treating RN: Oct 30, 1951 (72 y.o. Joshua [name], [name] Primary Care Wilder Kurowski: [name] Other Clinician: Referring Shiquan Mathieu: Treating Juma Oxley/Extender: [name] in Treatment: 2 Wound Status Wound Number: 4 Primary Etiology: Diabetic Wound/Ulcer of the Lower Extremity Wound Location: Left Foot Wound Status: Open Wounding Event: Gradually Appeared Comorbid History: Cataracts, Hypertension, Type II Diabetes, Neuropathy Date Acquired: 09/24/2022 Weeks Of Treatment: 2 Clustered Wound: No Pending Amputation On Presentation Photos Wound Measurements Length: (cm) 0.3 Width: (cm) 0.3 Depth: (cm) 0.2 Area: (cm) 0.071 Volume: (cm) 0.014 % Reduction in Area: 63.8% % Reduction in Volume: 89.8% Epithelialization: None Wound Description Classification: Grade 1 Wound Margin: Distinct, outline attached Exudate Amount: Medium Exudate Type: Serosanguineous Exudate Color: red, brown Foul Odor After Cleansing: Yes Due to Product Use: No Slough/Fibrino Yes Wound Bed Granulation Amount: Large (67-100%) Exposed Structure Granulation Quality: Red, Pink Fascia Exposed: No Necrotic Amount: None Present (0%) Fat Layer (Subcutaneous Tissue) Exposed: Yes Tendon Exposed: No Muscle Exposed: No Joint Exposed: No NTHONY, TRIPLETT A ([id number]) [id number] Page 6 of 7 Bone Exposed: No Periwound Skin Texture Texture Color No Abnormalities Noted: No No Abnormalities Noted: No Callus: Yes Atrophie Blanche: No Crepitus:  No Cyanosis: No Excoriation: No Ecchymosis: No Induration: No Erythema: No Rash: No Hemosiderin Staining: No Scarring: No Mottled: No Pallor: No Moisture Rubor: No No Abnormalities Noted: No Dry / Scaly: No Temperature / Pain Maceration: No Temperature: No Abnormality Tenderness on Palpation: Yes Treatment Notes Wound #4 (Foot) Wound Laterality: Left Cleanser Vashe 5.8 (oz) Discharge Instruction: Cleanse the wound with Vashe prior to applying a clean dressing using gauze sponges, not tissue or cotton balls. Peri-Wound Care Topical Primary Dressing Hydrofera Blue Ready Transfer Foam, 4x5 (in/in) Discharge Instruction: Apply to wound bed as instructed MediHoney Gel, tube 1.5 (oz) Discharge Instruction: Apply to wound bed as instructed Secondary Dressing Optifoam Non-Adhesive Dressing, 4x4 in Discharge Instruction: Apply foam donut over primary dressing as directed. Woven Gauze Sponge, Non-Sterile 4x4 in Discharge Instruction: Apply over primary dressing as directed. Secured With [employer], 4.5x3.1 (in/yd) Discharge Instruction: Secure with Kerlix as directed. 74M Medipore H Soft Cloth Surgical T ape, 4 x 10 (in/yd) Discharge Instruction: Secure with tape as directed. Stretch Net Size 5, 10 (yds) Compression Wrap Compression Stockings Add-Ons Electronic Signature(s) Signed: 02/06/2023 10:47:35 AM By: Rhae Hammock RN Entered By: Rhae Hammock on 02/05/2023 11:39:43 -------------------------------------------------------------------------------- Vitals Details Patient Name: Date of Service: Joshua Mane A. 02/05/2023 11:00 A M Medical Record Number: [id number] Patient Account Number: [id number] Date of Birth/Sex: Treating RN: 1951-06-25 (71 y.o. Joshua [name], [name] Primary Care Shyanna Klingel: [name] Other Clinician: Referring Deivi Huckins: Treating Taquila Leys/Extender: [name] in Treatment: 2 Vital Signs Time Taken:  11:07 Temperature (F): 98.7 Pulse (bpm): 83 [place], Laveda Abbe  A ([id number] Page 7 of 7 Respiratory Rate (breaths/min): 17 Blood Pressure (mmHg): 135/81 Reference Range: 80 - 120 mg / dl Electronic Signature(s) Signed: 02/06/2023 10:47:35 AM By: Rhae Hammock RN Entered By: Rhae Hammock on 02/05/2023 11:07:13

## 2023-02-06 NOTE — Telephone Encounter (Signed)
Patient is cx his appointment due to wound center putting a cast on his foot. Patient will continue to get treatment at the wound care center.

## 2023-02-06 NOTE — Progress Notes (Signed)
Joshua [name] ([id number]) [id number] Page 1 of 9 Visit Report for 02/05/2023 Chief Complaint Document Details Patient Name: Date of Service: [name] 02/05/2023 11:00 [name] Medical Record Number: [id number] Patient Account Number: [id number] Date of Birth/Sex: Treating RN: [birth year] (72 y.o. M) Primary Care Provider: [name] Other Clinician: Referring Provider: Treating Provider/Extender: [name] in Treatment: 2 Information Obtained from: Patient Chief Complaint 01/22/2023; left plantar diabetic foot ulcer Electronic Signature(s) Signed: 02/05/2023 3:34:03 PM By: Kalman Shan DO Entered By: Kalman Shan on 02/05/2023 12:02:57 -------------------------------------------------------------------------------- Debridement Details Patient Name: Date of Service: Joshua [name]. 02/05/2023 11:00 [name] Medical Record Number: [id number] Patient Account Number: [id number] Date of Birth/Sex: Treating RN: [birth year] (72 y.o. Joshua [name] Primary Care Provider: [name] Other Clinician: Referring Provider: Treating Provider/Extender: [name] in Treatment: 2 Debridement Performed for Assessment: Wound #4 Left Foot Performed By: Physician Kalman Shan, DO Debridement Type: Debridement Severity of Tissue Pre Debridement: Fat layer exposed Level of Consciousness (Pre-procedure): Awake and Alert Pre-procedure Verification/Time Out Yes - 11:40 Taken: Start Time: 11:40 Pain Control: Lidocaine T Area Debrided (L x W): otal 0.3 (cm) x 0.3 (cm) = 0.09 (cm) Tissue and other material debrided: Viable, Non-Viable, Callus, Slough, Subcutaneous, Slough Level: Skin/Subcutaneous Tissue Debridement Description: Excisional Instrument: Curette Bleeding: Minimum Hemostasis Achieved: Pressure End Time: 11:40 Procedural Pain: 0 Post Procedural Pain: 0 Response to Treatment: Procedure  was tolerated well Level of Consciousness (Post- Awake and Alert procedure): Post Debridement Measurements of Total Wound Length: (cm) 0.3 Width: (cm) 0.3 Depth: (cm) 0.2 Volume: (cm) 0.014 Character of Wound/Ulcer Post Debridement: Improved Severity of Tissue Post Debridement: Fat layer exposed Post Procedure Diagnosis Same as Pre-procedure Electronic Signature(s) Signed: 02/05/2023 3:34:03 PM By: Kalman Shan DO Signed: 02/06/2023 10:47:35 AM By: Rhae Hammock RN Entered By: Rhae Hammock on 02/05/2023 11:40:32 Joshua [name] ([id number]) [id number] Page 2 of 9 -------------------------------------------------------------------------------- HPI Details Patient Name: Date of Service: [name] 02/05/2023 11:00 [name] Medical Record Number: [id number] Patient Account Number: [id number] Date of Birth/Sex: Treating RN: Oct 02, 1951 (72 y.o. M) Primary Care Provider: [name] Other Clinician: Referring Provider: Treating Provider/Extender: [name] in Treatment: 2 History of Present Illness Location: left foot was the initial wound and then he has one on the right foot Quality: Patient reports No Pain. Severity: Patient states wound(s) are getting worse. Duration: Patient has had the wound for > 10 months prior to seeking treatment at the wound center Context: The wound would happen gradually Modifying Factors: Patient wound(s)/ulcer(s) are worsening due to : nonhealing nature in spite of various medications and procedures by the podiatrist Dr. Earleen Newport HPI Description: 01/22/2023 Joshua [name] With [name] past medical history of insulin-dependent type 2 diabetes with last hemoglobin 123456 of 7.9 complicated by peripheral neuropathy, essential tremors, and OSA that presents the clinic for [name] 3-51-month history of nonhealing ulcer to the plantar aspect of his left foot. He is not sure how it  started but over time has progressively become larger. He is not using any offloading device. He has been placing [name] Band-Aid with antibiotic ointment to the wound bed. He has been following with Dr. Earleen Newport for this issue, podiatry. He last had an x-ray on 12/05/2022 that showed no definitive cortical destruction suggesting osteomyelitis. He currently denies systemic signs of infection. 3/7; patient presents for follow-up. He has been using  Hydrofera Blue to the wound bed. He has been using his surgical shoe with peg assist for offloading. He has been taking his oral antibiotics. He has no issues or complaints today. 3/14; patient presents for follow-up. He has been using Hydrofera Blue with Medihoney to the wound bed. He has been using his surgical shoe with peg assist for offloading. He has no issues or complaints today. Electronic Signature(s) Signed: 02/05/2023 3:34:03 PM By: Kalman Shan DO Entered By: Kalman Shan on 02/05/2023 12:03:35 -------------------------------------------------------------------------------- Physical Exam Details Patient Name: Date of Service: [name]. 02/05/2023 11:00 [name] Medical Record Number: [id number] Patient Account Number: [id number] Date of Birth/Sex: Treating RN: [birth year] (72 y.o. M) Primary Care Provider: [name] Other Clinician: Referring Provider: Treating Provider/Extender: [name] in Treatment: 2 Constitutional respirations regular, non-labored and within target range for patient.. Cardiovascular 2+ dorsalis pedis/posterior tibialis pulses. Psychiatric pleasant and cooperative. Notes Left foot: T the plantar/medial aspect there is an open wound with granulation tissue, nonviable tissue and callus. No increased warmth, erythema or purulent o drainage. Electronic Signature(s) Signed: 02/05/2023 3:34:03 PM By: Kalman Shan DO Entered By: Kalman Shan on 02/05/2023  12:33:56 -------------------------------------------------------------------------------- Physician Orders Details Patient Name: Date of Service: Joshua [name]. 02/05/2023 11:00 [name] Medical Record Number: [id number] Patient Account Number: [id number] Date of Birth/Sex: Treating RN: [birth year] (72 y.o. Joshua Pop, Gerre Pebbles, Lina Sar ([id number]) [id number] Page 3 of 9 Primary Care Provider: [name] Other Clinician: Referring Provider: Treating Provider/Extender: [name] in Treatment: 2 Verbal / Phone Orders: No Diagnosis Coding Follow-up Appointments ppointment in 1 week. - w/ Dr. Heber Chesterton and Mayra Reel # 1 Thursday 02/10/23 @ 2:45 (1st TCC) AND Thursday 02/12/23 @ 2:00 Rm # 9 w/ Return [name] Lauran (1st check on TCC) Anesthetic (In clinic) Topical Lidocaine 5% applied to wound bed Bathing/ Shower/ Hygiene May shower with protection but do not get wound dressing(s) wet. Protect dressing(s) with water repellant cover (for example, large plastic bag) or [name] cast cover and may then take shower. Edema Control - Lymphedema / SCD / Other Elevate legs to the level of the heart or above for 30 minutes daily and/or when sitting for 3-4 times [name] day throughout the day. Avoid standing for long periods of time. Off-Loading Open toe surgical shoe to: - w/ peg assist Other: - Keep pressure off bottom of foot- Wound Treatment Wound #4 - Foot Wound Laterality: Left Cleanser: Vashe 5.8 (oz) (Generic) 1 x Per Day/15 Days Discharge Instructions: Cleanse the wound with Vashe prior to applying [name] clean dressing using gauze sponges, not tissue or cotton balls. Prim Dressing: Hydrofera Blue Ready Transfer Foam, 4x5 (in/in) (Generic) 1 x Per Day/15 Days ary Discharge Instructions: Apply to wound bed as instructed Prim Dressing: MediHoney Gel, tube 1.5 (oz) 1 x Per Day/15 Days ary Discharge Instructions: Apply to wound bed as  instructed Secondary Dressing: Optifoam Non-Adhesive Dressing, 4x4 in (Generic) 1 x Per Day/15 Days Discharge Instructions: Apply foam donut over primary dressing as directed. Secondary Dressing: Woven Gauze Sponge, Non-Sterile 4x4 in (Generic) 1 x Per Day/15 Days Discharge Instructions: Apply over primary dressing as directed. Secured With: [employer], 4.5x3.1 (in/yd) (Generic) 1 x Per Day/15 Days Discharge Instructions: Secure with Kerlix as directed. Secured With: 47M Medipore H Soft Cloth Surgical T ape, 4 x 10 (in/yd) (Generic) 1 x Per Day/15 Days Discharge Instructions: Secure with tape as directed. Secured With: [employer] Size  5, 10 (yds) (Generic) 1 x Per Day/15 Days Electronic Signature(s) Signed: 02/05/2023 3:34:03 PM By: Kalman Shan DO Entered By: Kalman Shan on 02/05/2023 12:34:03 -------------------------------------------------------------------------------- Problem List Details Patient Name: Date of Service: Joshua [name]. 02/05/2023 11:00 [name] Medical Record Number: [id number] Patient Account Number: [id number] Date of Birth/Sex: Treating RN: 05/27/1951 (72 y.o. M) Primary Care Provider: [name] Other Clinician: Referring Provider: Treating Provider/Extender: [name] in Treatment: 2 Active Problems ICD-10 Encounter Code Description Active Date MDM Diagnosis [phone] Non-pressure chronic ulcer of other part of left foot with fat layer exposed 01/22/2023 No Yes Joshua [name], Joshua [name] ([id number]) [phone].pdf Page 4 of 9 E11.621 Type 2 diabetes mellitus with foot ulcer 01/22/2023 No Yes G25.0 Essential tremor 01/22/2023 No Yes Inactive Problems Resolved Problems Electronic Signature(s) Signed: 02/05/2023 3:34:03 PM By: Kalman Shan DO Entered By: Kalman Shan on 02/05/2023 12:02:40 -------------------------------------------------------------------------------- Progress Note  Details Patient Name: Date of Service: Joshua [name]. 02/05/2023 11:00 [name] Medical Record Number: [id number] Patient Account Number: [id number] Date of Birth/Sex: Treating RN: [birth year] (72 y.o. M) Primary Care Provider: [name] Other Clinician: Referring Provider: Treating Provider/Extender: [name] in Treatment: 2 Subjective Chief Complaint Information obtained from Patient 01/22/2023; left plantar diabetic foot ulcer History of Present Illness (HPI) The following HPI elements were documented for the patient's wound: Location: left foot was the initial wound and then he has one on the right foot Quality: Patient reports No Pain. Severity: Patient states wound(s) are getting worse. Duration: Patient has had the wound for > 10 months prior to seeking treatment at the wound center Context: The wound would happen gradually Modifying Factors: Patient wound(s)/ulcer(s) are worsening due to : nonhealing nature in spite of various medications and procedures by the podiatrist Dr. Earleen Newport 01/22/2023 Mr. Behrett Ariaz is [name] 72 year old [name] With [name] past medical history of insulin-dependent type 2 diabetes with last hemoglobin 123456 of 7.9 complicated by peripheral neuropathy, essential tremors, and OSA that presents the clinic for [name] 3-17-month history of nonhealing ulcer to the plantar aspect of his left foot. He is not sure how it started but over time has progressively become larger. He is not using any offloading device. He has been placing [name] Band-Aid with antibiotic ointment to the wound bed. He has been following with Dr. Earleen Newport for this issue, podiatry. He last had an x-ray on 12/05/2022 that showed no definitive cortical destruction suggesting osteomyelitis. He currently denies systemic signs of infection. 3/7; patient presents for follow-up. He has been using Hydrofera Blue to the wound bed. He has been using his surgical shoe with peg assist for offloading.  He has been taking his oral antibiotics. He has no issues or complaints today. 3/14; patient presents for follow-up. He has been using Hydrofera Blue with Medihoney to the wound bed. He has been using his surgical shoe with peg assist for offloading. He has no issues or complaints today. Patient History Information obtained from Patient. Family History Diabetes - Maternal Grandparents. Social History Former smoker, Alcohol Use - Moderate - 2 drinks weekly, Drug Use - No History, Caffeine Use - Daily. Medical History Eyes Patient has history of Cataracts - extraction bilat Denies history of Glaucoma, Optic Neuritis Ear/Nose/Mouth/Throat Denies history of Chronic sinus problems/congestion, Middle ear problems Hematologic/Lymphatic Denies history of Anemia, Hemophilia, Human Immunodeficiency Virus, Lymphedema, Sickle Cell Disease Respiratory Denies history of Aspiration, Asthma, Chronic Obstructive Pulmonary Disease (COPD), Pneumothorax, Sleep Apnea, Tuberculosis  Cardiovascular Joshua [name], Joshua ([id number]) [id number] Page 5 of 9 Patient has history of Hypertension Denies history of Angina, Arrhythmia, Congestive Heart Failure, Coronary Artery Disease, Deep Vein Thrombosis, Hypotension, Myocardial Infarction, Peripheral Arterial Disease, Peripheral Venous Disease, Phlebitis, Vasculitis Gastrointestinal Denies history of Cirrhosis , Colitis, Crohnoos, Hepatitis [name], Hepatitis B, Hepatitis C Endocrine Patient has history of Type II Diabetes Denies history of Type I Diabetes Genitourinary Denies history of End Stage Renal Disease Immunological Denies history of Lupus Erythematosus, Raynaudoos, Scleroderma Integumentary (Skin) Denies history of History of Burn Musculoskeletal Denies history of Gout, Rheumatoid Arthritis, Osteoarthritis, Osteomyelitis Neurologic Patient has history of Neuropathy Denies history of Dementia, Quadriplegia, Paraplegia, Seizure  Disorder Oncologic Denies history of Received Chemotherapy, Received Radiation Psychiatric Denies history of Anorexia/bulimia, Confinement Anxiety Hospitalization/Surgery History - appendectomy. - tonsillectomy. - vasectomy. - nasal septum surgery. Medical [name] Surgical History Notes nd Constitutional Symptoms (General Health) arthro knee surgery Cardiovascular h/o CVA , Neurologic central tremors Oncologic h/o melanoma Objective Constitutional respirations regular, non-labored and within target range for patient.. Vitals Time Taken: 11:07 AM, Temperature: 98.7 F, Pulse: 83 bpm, Respiratory Rate: 17 breaths/min, Blood Pressure: 135/81 mmHg. Cardiovascular 2+ dorsalis pedis/posterior tibialis pulses. Psychiatric pleasant and cooperative. General Notes: Left foot: T the plantar/medial aspect there is an open wound with granulation tissue, nonviable tissue and callus. No increased warmth, o erythema or purulent drainage. Integumentary (Hair, Skin) Wound #4 status is Open. Original cause of wound was Gradually Appeared. The date acquired was: 09/24/2022. The wound has been in treatment 2 weeks. The wound is located on the Left Foot. The wound measures 0.3cm length x 0.3cm width x 0.2cm depth; 0.071cm^2 area and 0.014cm^3 volume. There is Fat Layer (Subcutaneous Tissue) exposed. There is [name] medium amount of serosanguineous drainage noted. Foul odor after cleansing was noted. The wound margin is distinct with the outline attached to the wound base. There is large (67-100%) red, pink granulation within the wound bed. There is no necrotic tissue within the wound bed. The periwound skin appearance exhibited: Callus. The periwound skin appearance did not exhibit: Crepitus, Excoriation, Induration, Rash, Scarring, Dry/Scaly, Maceration, Atrophie Blanche, Cyanosis, Ecchymosis, Hemosiderin Staining, Mottled, Pallor, Rubor, Erythema. Periwound temperature was noted as No Abnormality. The periwound  has tenderness on palpation. Assessment Active Problems ICD-10 Non-pressure chronic ulcer of other part of left foot with fat layer exposed Type 2 diabetes mellitus with foot ulcer Essential tremor Joshua [name], Joshua [name] ([id number]) [id number] Page 6 of 9 Patient's wound is stable. I debrided nonviable tissue. I recommended continued course with Medihoney and Hydrofera Blue for now. We discussed doing [name] total contact cast and patient would like to proceed with this. We will plan to start this next week. Follow-up in 1 week. Procedures Wound #4 Pre-procedure diagnosis of Wound #4 is [name] Diabetic Wound/Ulcer of the Lower Extremity located on the Left Foot .Severity of Tissue Pre Debridement is: Fat layer exposed. There was [name] Excisional Skin/Subcutaneous Tissue Debridement with [name] total area of 0.09 sq cm performed by Kalman Shan, DO. With the following instrument(s): Curette to remove Viable and Non-Viable tissue/material. Material removed includes Callus, Subcutaneous Tissue, and Slough after achieving pain control using Lidocaine. No specimens were taken. [name] time out was conducted at 11:40, prior to the start of the procedure. [name] Minimum amount of bleeding was controlled with Pressure. The procedure was tolerated well with [name] pain level of 0 throughout and [name] pain level of 0 following the procedure. Post Debridement Measurements: 0.3cm length x 0.3cm width x 0.2cm depth;  0.014cm^3 volume. Character of Wound/Ulcer Post Debridement is improved. Severity of Tissue Post Debridement is: Fat layer exposed. Post procedure Diagnosis Wound #4: Same as Pre-Procedure Plan Follow-up Appointments: Return Appointment in 1 week. - w/ Dr. Heber [place] and Mayra Reel # 1 Thursday 02/10/23 @ 2:45 (1st TCC) AND Thursday 02/12/23 @ 2:00 Rm # 9 w/ Allayne Butcher (1st check on TCC) Anesthetic: (In clinic) Topical Lidocaine 5% applied to wound bed Bathing/ Shower/ Hygiene: May shower with protection but do  not get wound dressing(s) wet. Protect dressing(s) with water repellant cover (for example, large plastic bag) or [name] cast cover and may then take shower. Edema Control - Lymphedema / SCD / Other: Elevate legs to the level of the heart or above for 30 minutes daily and/or when sitting for 3-4 times [name] day throughout the day. Avoid standing for long periods of time. Off-Loading: Open toe surgical shoe to: - w/ peg assist Other: - Keep pressure off bottom of foot- WOUND #4: - Foot Wound Laterality: Left Cleanser: Vashe 5.8 (oz) (Generic) 1 x Per Day/15 Days Discharge Instructions: Cleanse the wound with Vashe prior to applying [name] clean dressing using gauze sponges, not tissue or cotton balls. Prim Dressing: Hydrofera Blue Ready Transfer Foam, 4x5 (in/in) (Generic) 1 x Per Day/15 Days ary Discharge Instructions: Apply to wound bed as instructed Prim Dressing: MediHoney Gel, tube 1.5 (oz) 1 x Per Day/15 Days ary Discharge Instructions: Apply to wound bed as instructed Secondary Dressing: Optifoam Non-Adhesive Dressing, 4x4 in (Generic) 1 x Per Day/15 Days Discharge Instructions: Apply foam donut over primary dressing as directed. Secondary Dressing: Woven Gauze Sponge, Non-Sterile 4x4 in (Generic) 1 x Per Day/15 Days Discharge Instructions: Apply over primary dressing as directed. Secured With: [employer], 4.5x3.1 (in/yd) (Generic) 1 x Per Day/15 Days Discharge Instructions: Secure with Kerlix as directed. Secured With: 99M Medipore H Soft Cloth Surgical T ape, 4 x 10 (in/yd) (Generic) 1 x Per Day/15 Days Discharge Instructions: Secure with tape as directed. Secured With: [employer] Size 5, 10 (yds) (Generic) 1 x Per Day/15 Days 1. In office sharp debridement 2. Hydrofera Blue with Medihoney 3. Continue surgical shoe with peg assist for offloading 4. Follow-up in 1 week Electronic Signature(s) Signed: 02/05/2023 3:34:03 PM By: Kalman Shan DO Entered By: Kalman Shan on  02/05/2023 12:34:19 -------------------------------------------------------------------------------- HxROS Details Patient Name: Date of Service: Joshua [name]. 02/05/2023 11:00 [name] Medical Record Number: [id number] Patient Account Number: [id number] Date of Birth/Sex: Treating RN: 1951/08/18 (72 y.o. M) Primary Care Provider: [name] Other Clinician: Referring Provider: Treating Provider/Extender: [name] in Treatment: 2 Information Obtained From Patient Constitutional Symptoms ([place]) Joshua [name], Joshua [name] ([id number]) [id number] Page 7 of 9 Medical History: Past Medical History Notes: arthro knee surgery Eyes Medical History: Positive for: Cataracts - extraction bilat Negative for: Glaucoma; Optic Neuritis Ear/Nose/Mouth/Throat Medical History: Negative for: Chronic sinus problems/congestion; Middle ear problems Hematologic/Lymphatic Medical History: Negative for: Anemia; Hemophilia; Human Immunodeficiency Virus; Lymphedema; Sickle Cell Disease Respiratory Medical History: Negative for: Aspiration; Asthma; Chronic Obstructive Pulmonary Disease (COPD); Pneumothorax; Sleep Apnea; Tuberculosis Cardiovascular Medical History: Positive for: Hypertension Negative for: Angina; Arrhythmia; Congestive Heart Failure; Coronary Artery Disease; Deep Vein Thrombosis; Hypotension; Myocardial Infarction; Peripheral Arterial Disease; Peripheral Venous Disease; Phlebitis; Vasculitis Past Medical History Notes: h/o CVA , Gastrointestinal Medical History: Negative for: Cirrhosis ; Colitis; Crohns; Hepatitis [name]; Hepatitis B; Hepatitis C Endocrine Medical History: Positive for: Type II Diabetes Negative for: Type I Diabetes Time with diabetes:  since 2010 Treated with: Insulin Blood sugar tested every day: Yes Tested : daily Blood sugar testing results: Breakfast: 142 Genitourinary Medical History: Negative for: End  Stage Renal Disease Immunological Medical History: Negative for: Lupus Erythematosus; Raynauds; Scleroderma Integumentary (Skin) Medical History: Negative for: History of Burn Musculoskeletal Medical History: Negative for: Gout; Rheumatoid Arthritis; Osteoarthritis; Osteomyelitis Neurologic Medical History: Positive for: Neuropathy Negative for: Dementia; Quadriplegia; Paraplegia; Seizure Disorder Past Medical History Notes: central tremors Oncologic Joshua [name], Joshua [name] ([id number]) [id number] Page 8 of 9 Medical History: Negative for: Received Chemotherapy; Received Radiation Past Medical History Notes: h/o melanoma Psychiatric Medical History: Negative for: Anorexia/bulimia; Confinement Anxiety HBO Extended History Items Eyes: Cataracts Immunizations Pneumococcal Vaccine: Received Pneumococcal Vaccination: No Immunization Notes: unknown Implantable Devices No devices added Hospitalization / Surgery History Type of Hospitalization/Surgery appendectomy tonsillectomy vasectomy nasal septum surgery Family and Social History Diabetes: Yes - Maternal Grandparents; Former smoker; Alcohol Use: Moderate - 2 drinks weekly; Drug Use: No History; Caffeine Use: Daily; Financial Concerns: No; Food, Clothing or Shelter Needs: No; Support System Lacking: No; Transportation Concerns: No Electronic Signature(s) Signed: 02/05/2023 3:34:03 PM By: Kalman Shan DO Entered By: Kalman Shan on 02/05/2023 12:03:41 -------------------------------------------------------------------------------- SuperBill Details Patient Name: Date of Service: Joshua [name]. 02/05/2023 Medical Record Number: [id number] Patient Account Number: [id number] Date of Birth/Sex: Treating RN: 01/08/1951 (72 y.o. Joshua [name] Primary Care Provider: [name] Other Clinician: Referring Provider: Treating Provider/Extender: [name] in  Treatment: 2 Diagnosis Coding ICD-10 Codes Code Description [phone] Non-pressure chronic ulcer of other part of left foot with fat layer exposed E11.621 Type 2 diabetes mellitus with foot ulcer G25.0 Essential tremor Facility Procedures : CPT4 Code: [id number] Description: [id number] - DEB SUBQ TISSUE 20 SQ CM/< ICD-10 Diagnosis Description L97.522 Non-pressure chronic ulcer of other part of left foot with fat layer exposed E11.621 Type 2 diabetes mellitus with foot ulcer Modifier: Quantity: 1 Physician Procedures : CPT4 Code Description Modifier [id number] - WC PHYS SUBQ TISS 20 SQ CM ICD-10 Diagnosis Description ANTHEM, BUTIKOFER ([id number]) [id number] Non-pressure chronic ulcer of other part of left foot with fat layer exposed  E11.621 Type 2 diabetes mellitus with foot ulcer Quantity: 1 227.pdf Page 9 of 9 Electronic Signature(s) Signed: 02/05/2023 3:34:03 PM By: Kalman Shan DO Entered By: Kalman Shan on 02/05/2023 12:34:27

## 2023-02-10 ENCOUNTER — Encounter (HOSPITAL_BASED_OUTPATIENT_CLINIC_OR_DEPARTMENT_OTHER): Payer: Medicare PPO | Admitting: Internal Medicine

## 2023-02-10 DIAGNOSIS — E11621 Type 2 diabetes mellitus with foot ulcer: Secondary | ICD-10-CM

## 2023-02-10 DIAGNOSIS — G250 Essential tremor: Secondary | ICD-10-CM | POA: Diagnosis not present

## 2023-02-10 DIAGNOSIS — L97522 Non-pressure chronic ulcer of other part of left foot with fat layer exposed: Secondary | ICD-10-CM | POA: Diagnosis not present

## 2023-02-12 ENCOUNTER — Encounter (HOSPITAL_BASED_OUTPATIENT_CLINIC_OR_DEPARTMENT_OTHER): Payer: Medicare PPO | Admitting: Internal Medicine

## 2023-02-12 DIAGNOSIS — E11621 Type 2 diabetes mellitus with foot ulcer: Secondary | ICD-10-CM | POA: Diagnosis not present

## 2023-02-12 DIAGNOSIS — G250 Essential tremor: Secondary | ICD-10-CM | POA: Diagnosis not present

## 2023-02-12 DIAGNOSIS — L97522 Non-pressure chronic ulcer of other part of left foot with fat layer exposed: Secondary | ICD-10-CM

## 2023-02-12 NOTE — Progress Notes (Signed)
Joshua, [name] ([id number]) [id number] Page 1 of 9 Visit Report for 02/10/2023 Chief Complaint Document Details Patient Name: Date of Service: Joshua [name] 02/10/2023 2:45 PM Medical Record Number: [id number] Patient Account Number: [id number] Date of Birth/Sex: Treating RN: 23-Dec-1950 (72 y.o. Joshua [name] Primary Care Provider: [name] Other Clinician: Referring Provider: Treating Provider/Extender: [name] in Treatment: 2 Information Obtained from: Patient Chief Complaint 01/22/2023; left plantar diabetic foot ulcer Electronic Signature(s) Signed: 02/10/2023 5:03:25 PM By: Kalman Shan DO Entered By: Kalman Shan on 02/10/2023 16:47:34 -------------------------------------------------------------------------------- Debridement Details Patient Name: Date of Service: Joshua [name]. 02/10/2023 2:45 PM Medical Record Number: [id number] Patient Account Number: [id number] Date of Birth/Sex: Treating RN: 30-Jan-1951 (72 y.o. Joshua [name] Primary Care Provider: [name] Other Clinician: Referring Provider: Treating Provider/Extender: [name] in Treatment: 2 Debridement Performed for Assessment: Wound #4 Left Foot Performed By: Physician Kalman Shan, DO Debridement Type: Debridement Severity of Tissue Pre Debridement: Fat layer exposed Level of Consciousness (Pre-procedure): Awake and Alert Pre-procedure Verification/Time Out Yes - 15:30 Taken: Start Time: 15:30 Pain Control: Lidocaine 4% T opical Solution T Area Debrided (L x W): otal 1 (cm) x 1 (cm) = 1 (cm) Tissue and other material debrided: Viable, Non-Viable, Callus, Slough, Subcutaneous, Skin: Epidermis, Slough Level: Skin/Subcutaneous Tissue Debridement Description: Excisional Instrument: Curette Bleeding: Minimum Hemostasis Achieved: Pressure Procedural Pain: 0 Post Procedural Pain: 0 Response  to Treatment: Procedure was tolerated well Level of Consciousness (Post- Awake and Alert procedure): Post Debridement Measurements of Total Wound Length: (cm) 0.5 Width: (cm) 0.5 Depth: (cm) 0.3 Volume: (cm) 0.059 Character of Wound/Ulcer Post Debridement: Improved Severity of Tissue Post Debridement: Fat layer exposed Post Procedure Diagnosis Same as Pre-procedure Notes scribed for Dr. Heber [place] by Baruch Gouty, RN Electronic Signature(s) Signed: 02/10/2023 4:17:28 PM By: Baruch Gouty RN, BSN Joshua [name] ([id number]) [id number] Page 2 of 9 Signed: 02/10/2023 5:03:25 PM By: Kalman Shan DO Entered By: Baruch Gouty on 02/10/2023 15:33:15 -------------------------------------------------------------------------------- HPI Details Patient Name: Date of Service: Joshua [name]. 02/10/2023 2:45 PM Medical Record Number: [id number] Patient Account Number: [id number] Date of Birth/Sex: Treating RN: [birth year] (72 y.o. Joshua [name] Primary Care Provider: [name] Other Clinician: Referring Provider: Treating Provider/Extender: [name] in Treatment: 2 History of Present Illness Location: left foot was the initial wound and then he has one on the right foot Quality: Patient reports No Pain. Severity: Patient states wound(s) are getting worse. Duration: Patient has had the wound for > 10 months prior to seeking treatment at the wound center Context: The wound would happen gradually Modifying Factors: Patient wound(s)/ulcer(s) are worsening due to : nonhealing nature in spite of various medications and procedures by the podiatrist Dr. Earleen Newport HPI Description: 01/22/2023 Joshua [name] is [name] With [name] past medical history of insulin-dependent type 2 diabetes with last hemoglobin 123456 of 7.9 complicated by peripheral neuropathy, essential tremors, and OSA that presents the clinic for [name] 3-77-month  history of nonhealing ulcer to the plantar aspect of his left foot. He is not sure how it started but over time has progressively become larger. He is not using any offloading device. He has been placing [name] Band-Aid with antibiotic ointment to the wound bed. He has been following with Dr. Earleen Newport for this issue, podiatry. He last had an x-ray on 12/05/2022 that showed no definitive cortical destruction suggesting osteomyelitis. He currently  denies systemic signs of infection. 3/7; patient presents for follow-up. He has been using Hydrofera Blue to the wound bed. He has been using his surgical shoe with peg assist for offloading. He has been taking his oral antibiotics. He has no issues or complaints today. 3/14; patient presents for follow-up. He has been using Hydrofera Blue with Medihoney to the wound bed. He has been using his surgical shoe with peg assist for offloading. He has no issues or complaints today. 3/19; patient presents for follow-up. He has been using Hydrofera Blue and Medihoney to the wound bed. Plan is for the total contact cast today. He has no issues or complaints today. Electronic Signature(s) Signed: 02/10/2023 5:03:25 PM By: Kalman Shan DO Entered By: Kalman Shan on 02/10/2023 16:48:26 -------------------------------------------------------------------------------- Physical Exam Details Patient Name: Date of Service: Joshua [name]. 02/10/2023 2:45 PM Medical Record Number: [id number] Patient Account Number: [id number] Date of Birth/Sex: Treating RN: 03-Aug-1951 (72 y.o. Joshua [name] Primary Care Provider: [name] Other Clinician: Referring Provider: Treating Provider/Extender: [name] in Treatment: 2 Constitutional respirations regular, non-labored and within target range for patient.. Cardiovascular 2+ dorsalis pedis/posterior tibialis pulses. Psychiatric pleasant and cooperative. Notes Left foot: T the  plantar/medial aspect there is an open wound with granulation tissue, nonviable tissue and callus. No increased warmth, erythema or purulent o drainage. Electronic Signature(s) Signed: 02/10/2023 5:03:25 PM By: Kalman Shan DO Entered By: Kalman Shan on 02/10/2023 16:48:49 Joshua [name] ([id number] Page 3 of 9 -------------------------------------------------------------------------------- Physician Orders Details Patient Name: Date of Service: Joshua [name] 02/10/2023 2:45 PM Medical Record Number: [id number] Patient Account Number: [id number] Date of Birth/Sex: Treating RN: Mar 30, 1951 (72 y.o. Joshua [name] Primary Care Provider: [name] Other Clinician: Referring Provider: Treating Provider/Extender: [name] in Treatment: 2 Verbal / Phone Orders: No Diagnosis Coding ICD-10 Coding Code Description [id number] Non-pressure chronic ulcer of other part of left foot with fat layer exposed E11.621 Type 2 diabetes mellitus with foot ulcer G25.0 Essential tremor Follow-up Appointments ppointment in 1 week. - w/ Dr. Heber [place] Thursday 02/12/23 @ 2:00 Rm # 9 w/ Allayne Butcher (1st check on TCC) Return [name] Anesthetic (In clinic) Topical Lidocaine 5% applied to wound bed Bathing/ Shower/ Hygiene May shower with protection but do not get wound dressing(s) wet. Protect dressing(s) with water repellant cover (for example, large plastic bag) or [name] cast cover and may then take shower. Edema Control - Lymphedema / SCD / Other Elevate legs to the level of the heart or above for 30 minutes daily and/or when sitting for 3-4 times [name] day throughout the day. Avoid standing for long periods of time. Off-Loading Total Contact Cast to Left Lower Extremity Wound Treatment Wound #4 - Foot Wound Laterality: Left Cleanser: Vashe 5.8 (oz) (Generic) 1 x Per Week/15 Days Discharge Instructions: Cleanse the wound with Vashe prior to  applying [name] clean dressing using gauze sponges, not tissue or cotton balls. Topical: Gentamicin 1 x Per Week/15 Days Discharge Instructions: As directed by physician Prim Dressing: Hydrofera Blue Ready Transfer Foam, 4x5 (in/in) (Generic) 1 x Per Week/15 Days ary Discharge Instructions: Apply to wound bed as instructed Secondary Dressing: Optifoam Non-Adhesive Dressing, 4x4 in (Generic) 1 x Per Week/15 Days Discharge Instructions: Apply foam donut over primary dressing as directed. Secondary Dressing: Woven Gauze Sponge, Non-Sterile 4x4 in (Generic) 1 x Per Week/15 Days Discharge Instructions: Apply over primary dressing as directed. Secured With: [place]  Surgical T ape, 4 x 10 (in/yd) (Generic) 1 x Per Week/15 Days Discharge Instructions: Secure with tape as directed. Electronic Signature(s) Signed: 02/10/2023 5:03:25 PM By: Kalman Shan DO Previous Signature: 02/10/2023 4:17:28 PM Version By: Baruch Gouty RN, BSN Entered By: Kalman Shan on 02/10/2023 16:48:58 -------------------------------------------------------------------------------- Problem List Details Patient Name: Date of Service: Joshua [name]. 02/10/2023 2:45 PM Medical Record Number: [id number] Patient Account Number: [id number] Date of Birth/Sex: Treating RN: Dec 16, 1950 (72 y.o. Valene Bors Primary Care Provider: [name] Other Clinician: Referring Provider: Treating Provider/Extender: [name] in Treatment: 2 Joshua [name], Joshua [name] ([id number]) [id number] Page 4 of 9 Active Problems ICD-10 Encounter Code Description Active Date MDM Diagnosis L97.522 Non-pressure chronic ulcer of other part of left foot with fat layer exposed 01/22/2023 No Yes E11.621 Type 2 diabetes mellitus with foot ulcer 01/22/2023 No Yes G25.0 Essential tremor 01/22/2023 No Yes Inactive Problems Resolved Problems Electronic Signature(s) Signed: 02/10/2023  5:03:25 PM By: Kalman Shan DO Entered By: Kalman Shan on 02/10/2023 16:46:48 -------------------------------------------------------------------------------- Progress Note Details Patient Name: Date of Service: Joshua [name]. 02/10/2023 2:45 PM Medical Record Number: [id number] Patient Account Number: [id number] Date of Birth/Sex: Treating RN: 18-Dec-1950 (72 y.o. Joshua [name] Primary Care Provider: [name] Other Clinician: Referring Provider: Treating Provider/Extender: [name] in Treatment: 2 Subjective Chief Complaint Information obtained from Patient 01/22/2023; left plantar diabetic foot ulcer History of Present Illness (HPI) The following HPI elements were documented for the patient's wound: Location: left foot was the initial wound and then he has one on the right foot Quality: Patient reports No Pain. Severity: Patient states wound(s) are getting worse. Duration: Patient has had the wound for > 10 months prior to seeking treatment at the wound center Context: The wound would happen gradually Modifying Factors: Patient wound(s)/ulcer(s) are worsening due to : nonhealing nature in spite of various medications and procedures by the podiatrist Dr. Earleen Newport 01/22/2023 Mr. Nang Havel is [name] With [name] past medical history of insulin-dependent type 2 diabetes with last hemoglobin 123456 of 7.9 complicated by peripheral neuropathy, essential tremors, and OSA that presents the clinic for [name] 3-40-month history of nonhealing ulcer to the plantar aspect of his left foot. He is not sure how it started but over time has progressively become larger. He is not using any offloading device. He has been placing [name] Band-Aid with antibiotic ointment to the wound bed. He has been following with Dr. Earleen Newport for this issue, podiatry. He last had an x-ray on 12/05/2022 that showed no definitive cortical destruction suggesting osteomyelitis. He  currently denies systemic signs of infection. 3/7; patient presents for follow-up. He has been using Hydrofera Blue to the wound bed. He has been using his surgical shoe with peg assist for offloading. He has been taking his oral antibiotics. He has no issues or complaints today. 3/14; patient presents for follow-up. He has been using Hydrofera Blue with Medihoney to the wound bed. He has been using his surgical shoe with peg assist for offloading. He has no issues or complaints today. 3/19; patient presents for follow-up. He has been using Hydrofera Blue and Medihoney to the wound bed. Plan is for the total contact cast today. He has no issues or complaints today. Patient History Information obtained from Patient. Family History Diabetes - Maternal Grandparents. Social History Former smoker, Alcohol Use - Moderate - 2 drinks weekly, Drug Use - No History, Caffeine Use -  Daily. Joshua [name], Joshua [name] ([id number]) [id number] Page 5 of 9 Medical History Eyes Patient has history of Cataracts - extraction bilat Denies history of Glaucoma, Optic Neuritis Ear/Nose/Mouth/Throat Denies history of Chronic sinus problems/congestion, Middle ear problems Hematologic/Lymphatic Denies history of Anemia, Hemophilia, Human Immunodeficiency Virus, Lymphedema, Sickle Cell Disease Respiratory Denies history of Aspiration, Asthma, Chronic Obstructive Pulmonary Disease (COPD), Pneumothorax, Sleep Apnea, Tuberculosis Cardiovascular Patient has history of Hypertension Denies history of Angina, Arrhythmia, Congestive Heart Failure, Coronary Artery Disease, Deep Vein Thrombosis, Hypotension, Myocardial Infarction, Peripheral Arterial Disease, Peripheral Venous Disease, Phlebitis, Vasculitis Gastrointestinal Denies history of Cirrhosis , Colitis, Crohnoos, Hepatitis [name], Hepatitis B, Hepatitis C Endocrine Patient has history of Type II Diabetes Denies history of Type I  Diabetes Genitourinary Denies history of End Stage Renal Disease Immunological Denies history of Lupus Erythematosus, Raynaudoos, Scleroderma Integumentary (Skin) Denies history of History of Burn Musculoskeletal Denies history of Gout, Rheumatoid Arthritis, Osteoarthritis, Osteomyelitis Neurologic Patient has history of Neuropathy Denies history of Dementia, Quadriplegia, Paraplegia, Seizure Disorder Oncologic Denies history of Received Chemotherapy, Received Radiation Psychiatric Denies history of Anorexia/bulimia, Confinement Anxiety Hospitalization/Surgery History - appendectomy. - tonsillectomy. - vasectomy. - nasal septum surgery. Medical [name] Surgical History Notes nd Constitutional Symptoms (General Health) arthro knee surgery Cardiovascular h/o CVA , Neurologic central tremors Oncologic h/o melanoma Objective Constitutional respirations regular, non-labored and within target range for patient.. Vitals Time Taken: 3:00 PM, Temperature: 98.2 F, Pulse: 80 bpm, Respiratory Rate: 18 breaths/min, Blood Pressure: 164/89 mmHg. Cardiovascular 2+ dorsalis pedis/posterior tibialis pulses. Psychiatric pleasant and cooperative. General Notes: Left foot: T the plantar/medial aspect there is an open wound with granulation tissue, nonviable tissue and callus. No increased warmth, o erythema or purulent drainage. Integumentary (Hair, Skin) Wound #4 status is Open. Original cause of wound was Gradually Appeared. The date acquired was: 09/24/2022. The wound has been in treatment 2 weeks. The wound is located on the Left Foot. The wound measures 0.3cm length x 0.1cm width x 0.4cm depth; 0.024cm^2 area and 0.009cm^3 volume. There is Fat Layer (Subcutaneous Tissue) exposed. There is no tunneling noted, however, there is undermining starting at 12:00 and ending at 12:00 with [name] maximum distance of 0.3cm. There is [name] medium amount of serosanguineous drainage noted. Foul odor after cleansing  was noted. The wound margin is distinct with the outline attached to the wound base. There is large (67-100%) red, pink granulation within the wound bed. There is no necrotic tissue within the wound bed. The periwound skin appearance exhibited: Callus. The periwound skin appearance did not exhibit: Crepitus, Excoriation, Induration, Rash, Scarring, Dry/Scaly, Maceration, Atrophie Blanche, Cyanosis, Ecchymosis, Hemosiderin Staining, Mottled, Pallor, Rubor, Erythema. Periwound temperature was noted as No Abnormality. The periwound has tenderness on palpation. Assessment Joshua [name], Joshua [name] ([id number]) [id number] Page 6 of 9 Active Problems ICD-10 Non-pressure chronic ulcer of other part of left foot with fat layer exposed Type 2 diabetes mellitus with foot ulcer Essential tremor Patient's wound is stable. I debrided nonviable tissue. I recommended antibiotic ointment with Hydrofera Blue under the total contact cast. He will be back in 2 days. With poor cast change. He knows to keep the block boot on at all times. He knows to not get the cast wet. Procedures Wound #4 Pre-procedure diagnosis of Wound #4 is [name] Diabetic Wound/Ulcer of the Lower Extremity located on the Left Foot .Severity of Tissue Pre Debridement is: Fat layer exposed. There was [name] Excisional Skin/Subcutaneous Tissue Debridement with [name] total area of 1 sq cm performed by Kalman Shan, DO. With the  following instrument(s): Curette to remove Viable and Non-Viable tissue/material. Material removed includes Callus, Subcutaneous Tissue, Slough, and Skin: Epidermis after achieving pain control using Lidocaine 4% Topical Solution. No specimens were taken. [name] time out was conducted at 15:30, prior to the start of the procedure. [name] Minimum amount of bleeding was controlled with Pressure. The procedure was tolerated well with [name] pain level of 0 throughout and [name] pain level of 0 following the procedure. Post Debridement  Measurements: 0.5cm length x 0.5cm width x 0.3cm depth; 0.059cm^3 volume. Character of Wound/Ulcer Post Debridement is improved. Severity of Tissue Post Debridement is: Fat layer exposed. Post procedure Diagnosis Wound #4: Same as Pre-Procedure General Notes: scribed for Dr. Heber [place] by Baruch Gouty, RN. Pre-procedure diagnosis of Wound #4 is [name] Diabetic Wound/Ulcer of the Lower Extremity located on the Left Foot . There was [name] T Programmer, multimedia Procedure otal by Kalman Shan, DO. Post procedure Diagnosis Wound #4: Same as Pre-Procedure Plan Follow-up Appointments: Return Appointment in 1 week. - w/ Dr. Heber [place] Thursday 02/12/23 @ 2:00 Rm # 9 w/ Allayne Butcher (1st check on TCC) Anesthetic: (In clinic) Topical Lidocaine 5% applied to wound bed Bathing/ Shower/ Hygiene: May shower with protection but do not get wound dressing(s) wet. Protect dressing(s) with water repellant cover (for example, large plastic bag) or [name] cast cover and may then take shower. Edema Control - Lymphedema / SCD / Other: Elevate legs to the level of the heart or above for 30 minutes daily and/or when sitting for 3-4 times [name] day throughout the day. Avoid standing for long periods of time. Off-Loading: T Contact Cast to Left Lower Extremity otal WOUND #4: - Foot Wound Laterality: Left Cleanser: Vashe 5.8 (oz) (Generic) 1 x Per Week/15 Days Discharge Instructions: Cleanse the wound with Vashe prior to applying [name] clean dressing using gauze sponges, not tissue or cotton balls. Topical: Gentamicin 1 x Per Week/15 Days Discharge Instructions: As directed by physician Prim Dressing: Hydrofera Blue Ready Transfer Foam, 4x5 (in/in) (Generic) 1 x Per Week/15 Days ary Discharge Instructions: Apply to wound bed as instructed Secondary Dressing: Optifoam Non-Adhesive Dressing, 4x4 in (Generic) 1 x Per Week/15 Days Discharge Instructions: Apply foam donut over primary dressing as directed. Secondary Dressing: Woven Gauze Sponge,  Non-Sterile 4x4 in (Generic) 1 x Per Week/15 Days Discharge Instructions: Apply over primary dressing as directed. Secured With: 36M Medipore H Soft Cloth Surgical T ape, 4 x 10 (in/yd) (Generic) 1 x Per Week/15 Days Discharge Instructions: Secure with tape as directed. 1. In office sharp debridement 2. Hydrofera Blue with antibiotic ointment 3. T contact cast placed in standard fashion otal 4. Follow-up in 1 week Electronic Signature(s) Signed: 02/10/2023 5:03:25 PM By: Kalman Shan DO Entered By: Kalman Shan on 02/10/2023 16:49:44 -------------------------------------------------------------------------------- HxROS Details Patient Name: Date of Service: Joshua [name]. 02/10/2023 2:45 PM Medical Record Number: [id number] Patient Account Number: [id number] Joshua [name], Joshua [name] ([id number]) [id number] Page 7 of 9 Date of Birth/Sex: Treating RN: 1951-06-11 (72 y.o. Joshua [name] Primary Care Provider: Other Clinician: [name] Referring Provider: Treating Provider/Extender: [name] in Treatment: 2 Information Obtained From Patient Constitutional Symptoms (General Health) Medical History: Past Medical History Notes: arthro knee surgery Eyes Medical History: Positive for: Cataracts - extraction bilat Negative for: Glaucoma; Optic Neuritis Ear/Nose/Mouth/Throat Medical History: Negative for: Chronic sinus problems/congestion; Middle ear problems Hematologic/Lymphatic Medical History: Negative for: Anemia; Hemophilia; Human Immunodeficiency Virus; Lymphedema; Sickle Cell Disease Respiratory Medical History: Negative for: Aspiration; Asthma; Chronic Obstructive  Pulmonary Disease (COPD); Pneumothorax; Sleep Apnea; Tuberculosis Cardiovascular Medical History: Positive for: Hypertension Negative for: Angina; Arrhythmia; Congestive Heart Failure; Coronary Artery Disease; Deep Vein Thrombosis; Hypotension;  Myocardial Infarction; Peripheral Arterial Disease; Peripheral Venous Disease; Phlebitis; Vasculitis Past Medical History Notes: h/o CVA , Gastrointestinal Medical History: Negative for: Cirrhosis ; Colitis; Crohns; Hepatitis [name]; Hepatitis B; Hepatitis C Endocrine Medical History: Positive for: Type II Diabetes Negative for: Type I Diabetes Time with diabetes: since 2010 Treated with: Insulin Blood sugar tested every day: Yes Tested : daily Blood sugar testing results: Breakfast: 142 Genitourinary Medical History: Negative for: End Stage Renal Disease Immunological Medical History: Negative for: Lupus Erythematosus; Raynauds; Scleroderma Integumentary (Skin) Medical History: Negative for: History of Burn Musculoskeletal Medical History: Negative for: Gout; Rheumatoid Arthritis; Osteoarthritis; Osteomyelitis Joshua [name], Joshua [name] [name] ([id number] Page 8 of 9 Neurologic Medical History: Positive for: Neuropathy Negative for: Dementia; Quadriplegia; Paraplegia; Seizure Disorder Past Medical History Notes: central tremors Oncologic Medical History: Negative for: Received Chemotherapy; Received Radiation Past Medical History Notes: h/o melanoma Psychiatric Medical History: Negative for: Anorexia/bulimia; Confinement Anxiety HBO Extended History Items Eyes: Cataracts Immunizations Pneumococcal Vaccine: Received Pneumococcal Vaccination: No Immunization Notes: unknown Implantable Devices No devices added Hospitalization / Surgery History Type of Hospitalization/Surgery appendectomy tonsillectomy vasectomy nasal septum surgery Family and Social History Diabetes: Yes - Maternal Grandparents; Former smoker; Alcohol Use: Moderate - 2 drinks weekly; Drug Use: No History; Caffeine Use: Daily; Financial Concerns: No; Food, Clothing or Shelter Needs: No; Support System Lacking: No; Transportation Concerns: No Electronic  Signature(s) Signed: 02/10/2023 5:03:25 PM By: Kalman Shan DO Signed: 02/11/2023 5:57:17 PM By: Baruch Gouty RN, BSN Entered By: Kalman Shan on 02/10/2023 16:48:32 -------------------------------------------------------------------------------- Total Contact Cast Details Patient Name: Date of Service: Joshua [name]. 02/10/2023 2:45 PM Medical Record Number: [id number] Patient Account Number: [id number] Date of Birth/Sex: Treating RN: 05-07-1951 (72 y.o. Joshua [name] Primary Care Provider: [name] Other Clinician: Referring Provider: Treating Provider/Extender: [name] in Treatment: 2 T Contact Cast Applied for Wound Assessment: otal Wound #4 Left Foot Performed By: Physician Kalman Shan, DO Post Procedure Diagnosis Same as Pre-procedure Electronic Signature(s) Signed: 02/10/2023 4:17:28 PM By: Baruch Gouty RN, BSN Signed: 02/10/2023 5:03:25 PM By: Kalman Shan DO Entered By: Baruch Gouty on 02/10/2023 15:33:29 Joshua [name] ([id number] Page 9 of 9 -------------------------------------------------------------------------------- SuperBill Details Patient Name: Date of Service: Joshua [name] 02/10/2023 Medical Record Number: [id number] Patient Account Number: [id number] Date of Birth/Sex: Treating RN: 03-07-1951 (72 y.o. Joshua [name] Primary Care Provider: [name] Other Clinician: Referring Provider: Treating Provider/Extender: [name] in Treatment: 2 Diagnosis Coding ICD-10 Codes Code Description [phone] Non-pressure chronic ulcer of other part of left foot with fat layer exposed E11.621 Type 2 diabetes mellitus with foot ulcer G25.0 Essential tremor Facility Procedures : CPT4 Code: [id number] Description: [id number] - DEB SUBQ TISSUE 20 SQ CM/< ICD-10 Diagnosis Description L97.522 Non-pressure chronic ulcer of other part of left  foot with fat layer exposed E11.621 Type 2 diabetes mellitus with foot ulcer Modifier: Quantity: 1 Physician Procedures : CPT4 Code Description Modifier [id number] - WC PHYS SUBQ TISS 20 SQ CM ICD-10 Diagnosis Description L97.522 Non-pressure chronic ulcer of other part of left foot with fat layer exposed E11.621 Type 2 diabetes mellitus with foot ulcer Quantity: 1 Electronic Signature(s) Signed: 02/10/2023 5:03:25 PM By: Kalman Shan DO Entered By: Kalman Shan on 02/10/2023 16:49:55

## 2023-02-13 ENCOUNTER — Ambulatory Visit: Payer: Medicare PPO | Admitting: Podiatry

## 2023-02-13 DIAGNOSIS — I6990 Unspecified sequelae of unspecified cerebrovascular disease: Secondary | ICD-10-CM | POA: Diagnosis not present

## 2023-02-13 DIAGNOSIS — I422 Other hypertrophic cardiomyopathy: Secondary | ICD-10-CM | POA: Diagnosis not present

## 2023-02-13 DIAGNOSIS — N1831 Chronic kidney disease, stage 3a: Secondary | ICD-10-CM | POA: Diagnosis not present

## 2023-02-13 DIAGNOSIS — Z794 Long term (current) use of insulin: Secondary | ICD-10-CM | POA: Diagnosis not present

## 2023-02-13 DIAGNOSIS — L97529 Non-pressure chronic ulcer of other part of left foot with unspecified severity: Secondary | ICD-10-CM | POA: Diagnosis not present

## 2023-02-13 DIAGNOSIS — E1129 Type 2 diabetes mellitus with other diabetic kidney complication: Secondary | ICD-10-CM | POA: Diagnosis not present

## 2023-02-13 DIAGNOSIS — I129 Hypertensive chronic kidney disease with stage 1 through stage 4 chronic kidney disease, or unspecified chronic kidney disease: Secondary | ICD-10-CM | POA: Diagnosis not present

## 2023-02-13 DIAGNOSIS — E1121 Type 2 diabetes mellitus with diabetic nephropathy: Secondary | ICD-10-CM | POA: Diagnosis not present

## 2023-02-14 NOTE — Progress Notes (Signed)
DANEIL, CARLO A ([id number]) [id number] Page 1 of 9 Visit Report for 02/12/2023 Chief Complaint Document Details Patient Name: Date of Service: [name] 02/12/2023 2:00 PM Medical Record Number: [id number] Patient Account Number: [id number] Date of Birth/Sex: Treating RN: Apr 07, 1951 (72 y.o. M) Primary Care Provider: [name] Other Clinician: Referring Provider: Treating Provider/Extender: [name] in Treatment: 3 Information Obtained from: Patient Chief Complaint 01/22/2023; left plantar diabetic foot ulcer Electronic Signature(s) Signed: 02/12/2023 3:35:26 PM By: Kalman Shan DO Entered By: Kalman Shan on 02/12/2023 14:42:53 -------------------------------------------------------------------------------- Debridement Details Patient Name: Date of Service: [name] 02/12/2023 2:00 PM Medical Record Number: [id number] Patient Account Number: [id number] Date of Birth/Sex: Treating RN: 1951-11-23 (72 y.o. [name], [name] Primary Care Provider: [name] Other Clinician: Referring Provider: Treating Provider/Extender: [name] in Treatment: 3 Debridement Performed for Assessment: Wound #4 Left Foot Performed By: Physician Kalman Shan, DO Debridement Type: Debridement Severity of Tissue Pre Debridement: Fat layer exposed Level of Consciousness (Pre-procedure): Awake and Alert Pre-procedure Verification/Time Out Yes - 14:24 Taken: Start Time: 14:24 Pain Control: Lidocaine T Area Debrided (L x W): otal 1 (cm) x 1 (cm) = 1 (cm) Tissue and other material debrided: Viable, Non-Viable, Callus, Slough, Subcutaneous, Slough Level: Skin/Subcutaneous Tissue Debridement Description: Excisional Instrument: Curette Bleeding: Minimum Hemostasis Achieved: Pressure End Time: 14:34 Procedural Pain: 0 Post Procedural Pain: 0 Response to Treatment: Procedure was  tolerated well Level of Consciousness (Post- Awake and Alert procedure): Post Debridement Measurements of Total Wound Length: (cm) 1 Width: (cm) 1 Depth: (cm) 0.4 Volume: (cm) 0.314 Character of Wound/Ulcer Post Debridement: Improved Severity of Tissue Post Debridement: Fat layer exposed Post Procedure Diagnosis Same as Pre-procedure Electronic Signature(s) Signed: 02/12/2023 3:35:26 PM By: Kalman Shan DO Signed: 02/13/2023 11:12:19 AM By: Rhae Hammock RN Entered By: Rhae Hammock on 02/12/2023 14:25:51 Halina Andreas A ([id number]) [id number] Page 2 of 9 -------------------------------------------------------------------------------- HPI Details Patient Name: Date of Service: [name] 02/12/2023 2:00 PM Medical Record Number: [id number] Patient Account Number: [id number] Date of Birth/Sex: Treating RN: Mar 12, 1951 (72 y.o. M) Primary Care Provider: [name] Other Clinician: Referring Provider: Treating Provider/Extender: [name] in Treatment: 3 History of Present Illness Location: left foot was the initial wound and then he has one on the right foot Quality: Patient reports No Pain. Severity: Patient states wound(s) are getting worse. Duration: Patient has had the wound for > 10 months prior to seeking treatment at the wound center Context: The wound would happen gradually Modifying Factors: Patient wound(s)/ulcer(s) are worsening due to : nonhealing nature in spite of various medications and procedures by the podiatrist Dr. Earleen Newport HPI Description: 01/22/2023 Mr. Homer Christopher is a 72 year old male With a past medical history of insulin-dependent type 2 diabetes with last hemoglobin 123456 of 7.9 complicated by peripheral neuropathy, essential tremors, and OSA that presents the clinic for a 3-62-month history of nonhealing ulcer to the plantar aspect of his left foot. He is not sure how it started but  over time has progressively become larger. He is not using any offloading device. He has been placing a Band-Aid with antibiotic ointment to the wound bed. He has been following with Dr. Earleen Newport for this issue, podiatry. He last had an x-ray on 12/05/2022 that showed no definitive cortical destruction suggesting osteomyelitis. He currently denies systemic signs of infection. 3/7; patient presents for follow-up. He has been using Hydrofera Blue to  the wound bed. He has been using his surgical shoe with peg assist for offloading. He has been taking his oral antibiotics. He has no issues or complaints today. 3/14; patient presents for follow-up. He has been using Hydrofera Blue with Medihoney to the wound bed. He has been using his surgical shoe with peg assist for offloading. He has no issues or complaints today. 3/19; patient presents for follow-up. He has been using Hydrofera Blue and Medihoney to the wound bed. Plan is for the total contact cast today. He has no issues or complaints today. 3/21; patient presents for obligatory cast change. We have been using Hydrofera Blue and antibiotic ointment under the total contact cast. He tolerated the cast well and has no issues or complaints today. Electronic Signature(s) Signed: 02/12/2023 3:35:26 PM By: Kalman Shan DO Entered By: Kalman Shan on 02/12/2023 14:43:18 -------------------------------------------------------------------------------- Physical Exam Details Patient Name: Date of Service: [name] 02/12/2023 2:00 PM Medical Record Number: [id number] Patient Account Number: [id number] Date of Birth/Sex: Treating RN: January 03, 1951 (72 y.o. M) Primary Care Provider: [name] Other Clinician: Referring Provider: Treating Provider/Extender: [name] in Treatment: 3 Constitutional respirations regular, non-labored and within target range for patient.. Cardiovascular 2+ dorsalis pedis/posterior  tibialis pulses. Psychiatric pleasant and cooperative. Notes Left foot: T the plantar/medial aspect there is an open wound with granulation tissue, nonviable tissue and callus. No increased warmth, erythema or purulent o drainage. Electronic Signature(s) Signed: 02/12/2023 3:35:26 PM By: Kalman Shan DO Entered By: Kalman Shan on 02/12/2023 14:43:50 [name] ([id number]) [id number] Page 3 of 9 -------------------------------------------------------------------------------- Physician Orders Details Patient Name: Date of Service: [name] 02/12/2023 2:00 PM Medical Record Number: [id number] Patient Account Number: [id number] Date of Birth/Sex: Treating RN: [birth year] (72 y.o. [name], [name] Primary Care Provider: [name] Other Clinician: Referring Provider: Treating Provider/Extender: [name] in Treatment: 3 Verbal / Phone Orders: No Diagnosis Coding Follow-up Appointments ppointment in 1 week. - w/ Dr. Heber [place] and Allayne Butcher Rm # 7 Tuesday 02/17/23 @ 9:00 Return A ppointment in 2 weeks. - w/ Dr. Celine Ahr on Tuesday Return A Anesthetic (In clinic) Topical Lidocaine 5% applied to wound bed Bathing/ Shower/ Hygiene May shower with protection but do not get wound dressing(s) wet. Protect dressing(s) with water repellant cover (for example, large plastic bag) or a cast cover and may then take shower. Edema Control - Lymphedema / SCD / Other Elevate legs to the level of the heart or above for 30 minutes daily and/or when sitting for 3-4 times a day throughout the day. Avoid standing for long periods of time. Off-Loading Total Contact Cast to Left Lower Extremity Wound Treatment Wound #4 - Foot Wound Laterality: Left Cleanser: Vashe 5.8 (oz) (Generic) 1 x Per Week/15 Days Discharge Instructions: Cleanse the wound with Vashe prior to applying a clean dressing using gauze sponges, not tissue or cotton  balls. Topical: Gentamicin 1 x Per Week/15 Days Discharge Instructions: As directed by physician Prim Dressing: Hydrofera Blue Ready Transfer Foam, 4x5 (in/in) (Generic) 1 x Per Week/15 Days ary Discharge Instructions: Apply to wound bed as instructed Secondary Dressing: Optifoam Non-Adhesive Dressing, 4x4 in (Generic) 1 x Per Week/15 Days Discharge Instructions: Apply foam donut over primary dressing as directed. Secondary Dressing: Woven Gauze Sponge, Non-Sterile 4x4 in (Generic) 1 x Per Week/15 Days Discharge Instructions: Apply over primary dressing as directed. Secured With: 65M Medipore H Soft Cloth Surgical T ape, 4 x 10 (in/yd) (  Generic) 1 x Per Week/15 Days Discharge Instructions: Secure with tape as directed. Electronic Signature(s) Signed: 02/12/2023 3:35:26 PM By: Kalman Shan DO Entered By: Kalman Shan on 02/12/2023 14:43:58 -------------------------------------------------------------------------------- Problem List Details Patient Name: Date of Service: [name] 02/12/2023 2:00 PM Medical Record Number: [id number] Patient Account Number: [id number] Date of Birth/Sex: Treating RN: [birth year] (72 y.o. M) Primary Care Provider: [name] Other Clinician: Referring Provider: Treating Provider/Extender: [name] in Treatment: 3 Active Problems ICD-10 Encounter Code Description Active Date MDM Diagnosis [phone] Non-pressure chronic ulcer of other part of left foot with fat layer exposed 01/22/2023 No Yes DRAYKE, FINAMORE A ([id number]) [id number] Page 4 of 9 E11.621 Type 2 diabetes mellitus with foot ulcer 01/22/2023 No Yes G25.0 Essential tremor 01/22/2023 No Yes Inactive Problems Resolved Problems Electronic Signature(s) Signed: 02/12/2023 3:35:26 PM By: Kalman Shan DO Entered By: Kalman Shan on 02/12/2023  14:42:39 -------------------------------------------------------------------------------- Progress Note Details Patient Name: Date of Service: [name] 02/12/2023 2:00 PM Medical Record Number: [id number] Patient Account Number: [id number] Date of Birth/Sex: Treating RN: [birth year] (72 y.o. M) Primary Care Provider: [name] Other Clinician: Referring Provider: Treating Provider/Extender: [name] in Treatment: 3 Subjective Chief Complaint Information obtained from Patient 01/22/2023; left plantar diabetic foot ulcer History of Present Illness (HPI) The following HPI elements were documented for the patient's wound: Location: left foot was the initial wound and then he has one on the right foot Quality: Patient reports No Pain. Severity: Patient states wound(s) are getting worse. Duration: Patient has had the wound for > 10 months prior to seeking treatment at the wound center Context: The wound would happen gradually Modifying Factors: Patient wound(s)/ulcer(s) are worsening due to : nonhealing nature in spite of various medications and procedures by the podiatrist Dr. Earleen Newport 01/22/2023 Mr. Radley Twing is a 72 year old male With a past medical history of insulin-dependent type 2 diabetes with last hemoglobin 123456 of 7.9 complicated by peripheral neuropathy, essential tremors, and OSA that presents the clinic for a 3-40-month history of nonhealing ulcer to the plantar aspect of his left foot. He is not sure how it started but over time has progressively become larger. He is not using any offloading device. He has been placing a Band-Aid with antibiotic ointment to the wound bed. He has been following with Dr. Earleen Newport for this issue, podiatry. He last had an x-ray on 12/05/2022 that showed no definitive cortical destruction suggesting osteomyelitis. He currently denies systemic signs of infection. 3/7; patient presents for follow-up. He has been using  Hydrofera Blue to the wound bed. He has been using his surgical shoe with peg assist for offloading. He has been taking his oral antibiotics. He has no issues or complaints today. 3/14; patient presents for follow-up. He has been using Hydrofera Blue with Medihoney to the wound bed. He has been using his surgical shoe with peg assist for offloading. He has no issues or complaints today. 3/19; patient presents for follow-up. He has been using Hydrofera Blue and Medihoney to the wound bed. Plan is for the total contact cast today. He has no issues or complaints today. 3/21; patient presents for obligatory cast change. We have been using Hydrofera Blue and antibiotic ointment under the total contact cast. He tolerated the cast well and has no issues or complaints today. Patient History Information obtained from Patient. Family History Diabetes - Maternal Grandparents. Social History Former smoker, Alcohol Use - Moderate -  2 drinks weekly, Drug Use - No History, Caffeine Use - Daily. Medical History Eyes Patient has history of Cataracts - extraction bilat Denies history of Glaucoma, Optic Neuritis Ear/Nose/Mouth/Throat Denies history of Chronic sinus problems/congestion, Middle ear problems KORAN, RUMLER A ([id number]) [id number] Page 5 of 9 Hematologic/Lymphatic Denies history of Anemia, Hemophilia, Human Immunodeficiency Virus, Lymphedema, Sickle Cell Disease Respiratory Denies history of Aspiration, Asthma, Chronic Obstructive Pulmonary Disease (COPD), Pneumothorax, Sleep Apnea, Tuberculosis Cardiovascular Patient has history of Hypertension Denies history of Angina, Arrhythmia, Congestive Heart Failure, Coronary Artery Disease, Deep Vein Thrombosis, Hypotension, Myocardial Infarction, Peripheral Arterial Disease, Peripheral Venous Disease, Phlebitis, Vasculitis Gastrointestinal Denies history of Cirrhosis , Colitis, Crohnoos, Hepatitis A, Hepatitis B,  Hepatitis C Endocrine Patient has history of Type II Diabetes Denies history of Type I Diabetes Genitourinary Denies history of End Stage Renal Disease Immunological Denies history of Lupus Erythematosus, Raynaudoos, Scleroderma Integumentary (Skin) Denies history of History of Burn Musculoskeletal Denies history of Gout, Rheumatoid Arthritis, Osteoarthritis, Osteomyelitis Neurologic Patient has history of Neuropathy Denies history of Dementia, Quadriplegia, Paraplegia, Seizure Disorder Oncologic Denies history of Received Chemotherapy, Received Radiation Psychiatric Denies history of Anorexia/bulimia, Confinement Anxiety Hospitalization/Surgery History - appendectomy. - tonsillectomy. - vasectomy. - nasal septum surgery. Medical A Surgical History Notes nd Constitutional Symptoms (General Health) arthro knee surgery Cardiovascular h/o CVA , Neurologic central tremors Oncologic h/o melanoma Objective Constitutional respirations regular, non-labored and within target range for patient.. Vitals Time Taken: 1:55 PM, Temperature: 98.2 F, Pulse: 94 bpm, Respiratory Rate: 18 breaths/min, Blood Pressure: 150/87 mmHg. Cardiovascular 2+ dorsalis pedis/posterior tibialis pulses. Psychiatric pleasant and cooperative. General Notes: Left foot: T the plantar/medial aspect there is an open wound with granulation tissue, nonviable tissue and callus. No increased warmth, o erythema or purulent drainage. Integumentary (Hair, Skin) Wound #4 status is Open. Original cause of wound was Gradually Appeared. The date acquired was: 09/24/2022. The wound has been in treatment 3 weeks. The wound is located on the Left Foot. The wound measures 1cm length x 1cm width x 0.4cm depth; 0.785cm^2 area and 0.314cm^3 volume. There is Fat Layer (Subcutaneous Tissue) exposed. There is no tunneling or undermining noted. There is a medium amount of serosanguineous drainage noted. Foul odor after cleansing  was noted. The wound margin is distinct with the outline attached to the wound base. There is large (67-100%) red, pink granulation within the wound bed. There is no necrotic tissue within the wound bed. The periwound skin appearance exhibited: Callus. The periwound skin appearance did not exhibit: Crepitus, Excoriation, Induration, Rash, Scarring, Dry/Scaly, Maceration, Atrophie Blanche, Cyanosis, Ecchymosis, Hemosiderin Staining, Mottled, Pallor, Rubor, Erythema. Periwound temperature was noted as No Abnormality. The periwound has tenderness on palpation. Assessment Active Problems ICD-10 Non-pressure chronic ulcer of other part of left foot with fat layer exposed Type 2 diabetes mellitus with foot ulcer Essential tremor HAYES, STACHOWICZ A ([id number]) [id number] Page 6 of 9 Patient's wound appears well-healing. I debrided nonviable tissue. I recommended continue Medihoney and antibiotic ointment under the total contact cast. Follow-up in 1 week. Procedures Wound #4 Pre-procedure diagnosis of Wound #4 is a Diabetic Wound/Ulcer of the Lower Extremity located on the Left Foot .Severity of Tissue Pre Debridement is: Fat layer exposed. There was a Excisional Skin/Subcutaneous Tissue Debridement with a total area of 1 sq cm performed by Kalman Shan, DO. With the following instrument(s): Curette to remove Viable and Non-Viable tissue/material. Material removed includes Callus, Subcutaneous Tissue, and Slough after achieving pain control using Lidocaine. No specimens were taken. A time out  was conducted at 14:24, prior to the start of the procedure. A Minimum amount of bleeding was controlled with Pressure. The procedure was tolerated well with a pain level of 0 throughout and a pain level of 0 following the procedure. Post Debridement Measurements: 1cm length x 1cm width x 0.4cm depth; 0.314cm^3 volume. Character of Wound/Ulcer Post Debridement is improved. Severity of  Tissue Post Debridement is: Fat layer exposed. Post procedure Diagnosis Wound #4: Same as Pre-Procedure Pre-procedure diagnosis of Wound #4 is a Diabetic Wound/Ulcer of the Lower Extremity located on the Left Foot . There was a T Programmer, multimedia Procedure otal by Kalman Shan, DO. Post procedure Diagnosis Wound #4: Same as Pre-Procedure Plan Follow-up Appointments: Return Appointment in 1 week. - w/ Dr. Heber [place] and Allayne Butcher Rm # 7 Tuesday 02/17/23 @ 9:00 Return Appointment in 2 weeks. - w/ Dr. Celine Ahr on Tuesday Anesthetic: (In clinic) Topical Lidocaine 5% applied to wound bed Bathing/ Shower/ Hygiene: May shower with protection but do not get wound dressing(s) wet. Protect dressing(s) with water repellant cover (for example, large plastic bag) or a cast cover and may then take shower. Edema Control - Lymphedema / SCD / Other: Elevate legs to the level of the heart or above for 30 minutes daily and/or when sitting for 3-4 times a day throughout the day. Avoid standing for long periods of time. Off-Loading: T Contact Cast to Left Lower Extremity otal WOUND #4: - Foot Wound Laterality: Left Cleanser: Vashe 5.8 (oz) (Generic) 1 x Per Week/15 Days Discharge Instructions: Cleanse the wound with Vashe prior to applying a clean dressing using gauze sponges, not tissue or cotton balls. Topical: Gentamicin 1 x Per Week/15 Days Discharge Instructions: As directed by physician Prim Dressing: Hydrofera Blue Ready Transfer Foam, 4x5 (in/in) (Generic) 1 x Per Week/15 Days ary Discharge Instructions: Apply to wound bed as instructed Secondary Dressing: Optifoam Non-Adhesive Dressing, 4x4 in (Generic) 1 x Per Week/15 Days Discharge Instructions: Apply foam donut over primary dressing as directed. Secondary Dressing: Woven Gauze Sponge, Non-Sterile 4x4 in (Generic) 1 x Per Week/15 Days Discharge Instructions: Apply over primary dressing as directed. Secured With: 64M Medipore H Soft Cloth Surgical T  ape, 4 x 10 (in/yd) (Generic) 1 x Per Week/15 Days Discharge Instructions: Secure with tape as directed. 1. In office sharp debridement 2. Hydrofera Blue and antibiotic ointment 3. T contact cast placed in standard fashion otal 4. Follow-up in 1 week Electronic Signature(s) Signed: 02/12/2023 3:35:26 PM By: Kalman Shan DO Entered By: Kalman Shan on 02/12/2023 14:47:07 -------------------------------------------------------------------------------- HxROS Details Patient Name: Date of Service: [name] 02/12/2023 2:00 PM Medical Record Number: [id number] Patient Account Number: [id number] Date of Birth/Sex: Treating RN: 24-Jan-1951 (72 y.o. M) Primary Care Provider: [name] Other Clinician: Referring Provider: Treating Provider/Extender: [name] in Treatment: 3 SHASHWAT, MELDRUM A ([id number]) [id number] Page 7 of 9 Information Obtained From Patient Constitutional Symptoms (General Health) Medical History: Past Medical History Notes: arthro knee surgery Eyes Medical History: Positive for: Cataracts - extraction bilat Negative for: Glaucoma; Optic Neuritis Ear/Nose/Mouth/Throat Medical History: Negative for: Chronic sinus problems/congestion; Middle ear problems Hematologic/Lymphatic Medical History: Negative for: Anemia; Hemophilia; Human Immunodeficiency Virus; Lymphedema; Sickle Cell Disease Respiratory Medical History: Negative for: Aspiration; Asthma; Chronic Obstructive Pulmonary Disease (COPD); Pneumothorax; Sleep Apnea; Tuberculosis Cardiovascular Medical History: Positive for: Hypertension Negative for: Angina; Arrhythmia; Congestive Heart Failure; Coronary Artery Disease; Deep Vein Thrombosis; Hypotension; Myocardial Infarction; Peripheral Arterial Disease; Peripheral Venous Disease; Phlebitis; Vasculitis Past Medical History  Notes: h/o CVA , Gastrointestinal Medical History: Negative  for: Cirrhosis ; Colitis; Crohns; Hepatitis A; Hepatitis B; Hepatitis C Endocrine Medical History: Positive for: Type II Diabetes Negative for: Type I Diabetes Time with diabetes: since 2010 Treated with: Insulin Blood sugar tested every day: Yes Tested : daily Blood sugar testing results: Breakfast: 142 Genitourinary Medical History: Negative for: End Stage Renal Disease Immunological Medical History: Negative for: Lupus Erythematosus; Raynauds; Scleroderma Integumentary (Skin) Medical History: Negative for: History of Burn Musculoskeletal Medical History: Negative for: Gout; Rheumatoid Arthritis; Osteoarthritis; Osteomyelitis Neurologic Medical History: Positive for: Neuropathy Negative for: Dementia; Quadriplegia; Paraplegia; Seizure Disorder MILNER, BRENNEKE ([id number]) [id number] Page 8 of 9 Past Medical History Notes: central tremors Oncologic Medical History: Negative for: Received Chemotherapy; Received Radiation Past Medical History Notes: h/o melanoma Psychiatric Medical History: Negative for: Anorexia/bulimia; Confinement Anxiety HBO Extended History Items Eyes: Cataracts Immunizations Pneumococcal Vaccine: Received Pneumococcal Vaccination: No Immunization Notes: unknown Implantable Devices No devices added Hospitalization / Surgery History Type of Hospitalization/Surgery appendectomy tonsillectomy vasectomy nasal septum surgery Family and Social History Diabetes: Yes - Maternal Grandparents; Former smoker; Alcohol Use: Moderate - 2 drinks weekly; Drug Use: No History; Caffeine Use: Daily; Financial Concerns: No; Food, Clothing or Shelter Needs: No; Support System Lacking: No; Transportation Concerns: No Electronic Signature(s) Signed: 02/12/2023 3:35:26 PM By: Kalman Shan DO Entered By: Kalman Shan on 02/12/2023 14:43:24 -------------------------------------------------------------------------------- Total  Contact Cast Details Patient Name: Date of Service: [name] 02/12/2023 2:00 PM Medical Record Number: [id number] Patient Account Number: [id number] Date of Birth/Sex: Treating RN: January 26, 1951 (72 y.o. [name], [name] Primary Care Provider: [name] Other Clinician: Referring Provider: Treating Provider/Extender: [name] in Treatment: 3 T Contact Cast Applied for Wound Assessment: otal Wound #4 Left Foot Performed By: Physician Kalman Shan, DO Post Procedure Diagnosis Same as Pre-procedure Electronic Signature(s) Signed: 02/12/2023 3:35:26 PM By: Kalman Shan DO Signed: 02/13/2023 11:12:19 AM By: Rhae Hammock RN Entered By: Rhae Hammock on 02/12/2023 14:28:32 -------------------------------------------------------------------------------- [place] Details Patient Name: Date of Service: [name] 02/12/2023 Medical Record Number: [id number] Patient Account Number: [id number] ADYSON, BUZEK A ([id number]) [id number] Page 9 of 9 Date of Birth/Sex: Treating RN: 10-06-1951 (72 y.o. [name], [name] Primary Care Provider: [name] Other Clinician: Referring Provider: Treating Provider/Extender: [name] in Treatment: 3 Diagnosis Coding ICD-10 Codes Code Description [phone] Non-pressure chronic ulcer of other part of left foot with fat layer exposed E11.621 Type 2 diabetes mellitus with foot ulcer G25.0 Essential tremor Facility Procedures : CPT4 Code: [id number] Description: [id number] - DEB SUBQ TISSUE 20 SQ CM/< ICD-10 Diagnosis Description L97.522 Non-pressure chronic ulcer of other part of left foot with fat layer exposed E11.621 Type 2 diabetes mellitus with foot ulcer Modifier: Quantity: 1 Physician Procedures : CPT4 Code Description Modifier [id number] - WC PHYS SUBQ TISS 20 SQ CM ICD-10 Diagnosis Description L97.522 Non-pressure chronic ulcer of  other part of left foot with fat layer exposed E11.621 Type 2 diabetes mellitus with foot ulcer Quantity: 1 Electronic Signature(s) Signed: 02/12/2023 3:35:26 PM By: Kalman Shan DO Entered By: Kalman Shan on 02/12/2023 14:47:16

## 2023-02-16 NOTE — Progress Notes (Signed)
GABY, WALENTA [name] ([id number]) [id number] Page 1 of 7 Visit Report for 02/12/2023 Arrival Information Details Patient Name: Date of Service: [name] 02/12/2023 2:00 PM Medical Record Number: [id number] Patient Account Number: [id number] Date of Birth/Sex: Treating RN: [birth year]-Joshua [name] (72 y.o. M) Primary Care Carloyn Lahue: [name] Other Clinician: Referring Traye Bates: Treating Jamarkis Branam/Extender: [name] in Treatment: 3 Visit Information History Since Last Visit Added or deleted any medications: No Patient Arrived: Joshua [name] Any new allergies or adverse reactions: No Arrival Time: 13:54 Had [name] fall or experienced change in No Accompanied By: self activities of daily living that Joshua affect Transfer Assistance: None risk of falls: Patient Identification Verified: Yes Signs or symptoms of abuse/neglect since last visito No Secondary Verification Process Completed: Yes Hospitalized since last visit: No Patient Requires Transmission-Based Precautions: No Implantable device outside of the clinic excluding No Patient Has Alerts: Yes cellular tissue based products placed in the center since last visit: Has Dressing in Place as Prescribed: Yes Has Footwear/Offloading in Place as Prescribed: Yes Right: T Contact Cast otal Pain Present Now: No Electronic Signature(s) Signed: 02/16/2023 4:16:21 PM By: Erenest Blank Entered By: Erenest Blank on 02/12/2023 13:55:17 -------------------------------------------------------------------------------- Encounter Discharge Information Details Patient Name: Date of Service: Joshua Mane [name]. 02/12/2023 2:00 PM Medical Record Number: [id number] Patient Account Number: [id number] Date of Birth/Sex: Treating RN: Joshua [name][birth year] (72 y.o. Joshua [name], Joshua [name] Primary Care Jacques Fife: [name] Other Clinician: Referring Ronnald Shedden: Treating Sargon Scouten/Extender: [name] in  Treatment: 3 Encounter Discharge Information Items Post Procedure Vitals Discharge Condition: Stable Temperature (F): 98.7 Ambulatory Status: Ambulatory Pulse (bpm): 74 Discharge Destination: Home Respiratory Rate (breaths/min): 17 Transportation: Private Auto Blood Pressure (mmHg): 120/80 Accompanied By: self Schedule Follow-up Appointment: Yes Clinical Summary of Care: Patient Declined Electronic Signature(s) Signed: 02/13/2023 11:12:19 AM By: Rhae Hammock RN Entered By: Rhae Hammock on 02/12/2023 14:34:00 -------------------------------------------------------------------------------- Lower Extremity Assessment Details Patient Name: Date of Service: Joshua Mane [name]. 02/12/2023 2:00 PM Medical Record Number: [id number] Patient Account Number: [id number] Date of Birth/Sex: Treating RN: Joshua [name][birth year] (72 y.o. M) Primary Care Vastie Douty: [name] Other Clinician: Referring Basheer Molchan: Treating Anis Cinelli/Extender: [name] in Treatment: 3 Edema Assessment Left: [Left: Right] [Right: :] Assessed: [Left: No] [Right: No] Edema: [Left: N] [Right: o] Calf Left: Right: Point of Measurement: 40 cm From Medial Instep 40.5 cm Ankle Left: Right: Point of Measurement: 11 cm From Medial Instep 26.5 cm Electronic Signature(s) Signed: 02/16/2023 4:16:21 PM By: Erenest Blank Entered By: Erenest Blank on 02/12/2023 14:05:04 -------------------------------------------------------------------------------- Multi Wound Chart Details Patient Name: Date of Service: Joshua Mane [name]. 02/12/2023 2:00 PM Medical Record Number: [id number] Patient Account Number: [id number] Date of Birth/Sex: Treating RN: [birth year] (72 y.o. M) Primary Care Yanis Juma: [name] Other Clinician: Referring Rada Zegers: Treating Alfio Loescher/Extender: [name] in Treatment: 3 Vital Signs Height(in): Pulse(bpm): 75 Weight(lbs): Blood Pressure(mmHg):  150/87 Body Mass Index(BMI): Temperature(F): 98.2 Respiratory Rate(breaths/min): 18 [4:Photos:] [[name]:[name]] Left Foot [name] [name] Wound Location: Gradually Appeared [name] [name] Wounding Event: Diabetic Wound/Ulcer of the Lower [name] [name] Primary Etiology: Extremity Cataracts, Hypertension, Type II [name] [name] Comorbid History: Diabetes, Neuropathy 09/24/2022 [name] [name] Date Acquired: 3 [name] [name] Weeks of Treatment: Open [name] [name] Wound Status: No [name] [name] Wound Recurrence: Yes [name] [name] Pending [name] mputation on Presentation: 1x1x0.4 [name] [name] Measurements L x W x D (cm) 0.785 [name] [name] [name] (cm) : rea 0.314 [name] [name] Volume (cm) : -300.50%  [name] [name] % Reduction in [name] rea: -129.20% [name] [name] % Reduction in Volume: Grade 1 [name] [name] Classification: Medium [name] [name] Exudate [name] mount: Serosanguineous [name] [name] Exudate Type: red, brown [name] [name] Exudate Color: Yes [name] [name] Foul Odor [name] Cleansing: fter No [name] [name] Odor [name] nticipated Due to Product Use: Distinct, outline attached [name] [name] Wound Margin: Large (67-100%) [name] [name] Granulation [name] mount: Red, Pink [name] [name] Granulation Quality: None Present (0%) [name] [name] Necrotic [name] mount: Fat Layer (Subcutaneous Tissue): Yes [name] [name] Exposed Structures: Fascia: No [name], [name] ([id number]) [phone].pdf Page 3 of 7 Tendon: No Muscle: No Joint: No Bone: No None [name] [name] Epithelialization: Debridement - Excisional [name] [name] Debridement: Pre-procedure Verification/Time Out 14:24 [name] [name] Taken: Lidocaine [name] [name] Pain Control: Callus, Subcutaneous, Slough [name] [name] Tissue Debrided: Skin/Subcutaneous Tissue [name] [name] Level: 1 [name] [name] Debridement [name] (sq cm): rea Curette [name] [name] Instrument: Minimum [name] [name] Bleeding: Pressure [name] [name] Hemostasis [name] chieved: 0 [name] [name] Procedural Pain: 0 [name] [name] Post Procedural Pain: Procedure was tolerated well [name] [name] Debridement Treatment Response: 1x1x0.4 [name] [name] Post Debridement Measurements L x W x  D (cm) 0.314 [name] [name] Post Debridement Volume: (cm) Callus: Yes [name] [name] Periwound Skin Texture: Excoriation: No Induration: No Crepitus: No Rash: No Scarring: No Maceration: No [name] [name] Periwound Skin Moisture: Dry/Scaly: No Atrophie Blanche: No [name] [name] Periwound Skin Color: Cyanosis: No Ecchymosis: No Erythema: No Hemosiderin Staining: No Mottled: No Pallor: No Rubor: No No Abnormality [name] [name] Temperature: Yes [name] [name] Tenderness on Palpation: Debridement [name] [name] Procedures Performed: T Contact Cast otal Treatment Notes Wound #4 (Foot) Wound Laterality: Left Cleanser Vashe 5.8 (oz) Discharge Instruction: Cleanse the wound with Vashe prior to applying [name] clean dressing using gauze sponges, not tissue or cotton balls. Peri-Wound Care Topical Gentamicin Discharge Instruction: As directed by physician Primary Dressing Hydrofera Blue Ready Transfer Foam, 4x5 (in/in) Discharge Instruction: Apply to wound bed as instructed Secondary Dressing Optifoam Non-Adhesive Dressing, 4x4 in Discharge Instruction: Apply foam donut over primary dressing as directed. Woven Gauze Sponge, Non-Sterile 4x4 in Discharge Instruction: Apply over primary dressing as directed. Secured With 68M Medipore H Soft Cloth Surgical T ape, 4 x [name] (in/yd) Discharge Instruction: Secure with tape as directed. Compression Wrap Compression Stockings Add-Ons Electronic Signature(s) Signed: 02/12/2023 3:35:26 PM By: Kalman Shan DO Entered By: Kalman Shan on 02/12/2023 14:42:45 [name] ([id number] Page 4 of 7 -------------------------------------------------------------------------------- Multi-Disciplinary Care Plan Details Patient Name: Date of Service: [name] 02/12/2023 2:00 PM Medical Record Number: [id number] Patient Account Number: [id number] Date of Birth/Sex: Treating RN: Joshua [name], Joshua [name] (72 y.o. Joshua [name], Joshua [name] Primary Care  Demontay Grantham: [name] Other Clinician: Referring Salih Williamson: Treating Jayd Forrey/Extender: [name] in Treatment: 3 Active Inactive Wound/Skin Impairment Nursing Diagnoses: Impaired tissue integrity Knowledge deficit related to ulceration/compromised skin integrity Goals: Patient will have [name] decrease in wound volume by X% from date: (specify in notes) Date Initiated: 01/22/2023 Target Resolution Date: 02/21/2023 Goal Status: Active Patient/caregiver will verbalize understanding of skin care regimen Date Initiated: 01/22/2023 Target Resolution Date: 02/21/2023 Goal Status: Active Ulcer/skin breakdown will have [name] volume reduction of 30% by week 4 Date Initiated: 01/22/2023 Target Resolution Date: 02/21/2023 Goal Status: Active Interventions: Assess patient/caregiver ability to obtain necessary supplies Assess patient/caregiver ability to perform ulcer/skin care regimen upon admission and as needed Assess ulceration(s) every visit Notes: Electronic Signature(s) Signed: 02/13/2023 11:12:19 AM By: Rhae Hammock RN Entered By: Rhae Hammock on 02/12/2023  14:22:03 -------------------------------------------------------------------------------- Pain Assessment Details Patient Name: Date of Service: [name] 02/12/2023 2:00 PM Medical Record Number: [id number] Patient Account Number: [id number] Date of Birth/Sex: Treating RN: Joshua [name][birth year] (72 y.o. M) Primary Care Shontia Gillooly: [name] Other Clinician: Referring Caellum Mancil: Treating Bellamie Turney/Extender: [name] in Treatment: 3 Active Problems Location of Pain Severity and Description of Pain Patient Has Paino No Site Locations ONATHAN, FADNESS [name] ([id number]) [id number] Page 5 of 7 Pain Management and Medication Current Pain Management: Electronic Signature(s) Signed: 02/16/2023 4:16:21 PM By: Erenest Blank Entered By: Erenest Blank on 02/12/2023  13:55:42 -------------------------------------------------------------------------------- Patient/Caregiver Education Details Patient Name: Date of Service: Joshua [name] 3/21/2024andnbsp2:00 PM Medical Record Number: [id number] Patient Account Number: [id number] Date of Birth/Gender: Treating RN: [birth year] Joshua [name] (72 y.o. Joshua [name] Primary Care Physician: [name] Other Clinician: Referring Physician: Treating Physician/Extender: [name] in Treatment: 3 Education Assessment Education Provided To: Patient Education Topics Provided Wound/Skin Impairment: Methods: Explain/Verbal Responses: Reinforcements needed, State content correctly Electronic Signature(s) Signed: 02/13/2023 11:12:19 AM By: Rhae Hammock RN Entered By: Rhae Hammock on 02/12/2023 14:22:48 -------------------------------------------------------------------------------- Wound Assessment Details Patient Name: Date of Service: Joshua Mane [name]. 02/12/2023 2:00 PM Medical Record Number: [id number] Patient Account Number: [id number] Date of Birth/Sex: Treating RN: [birth year]-Joshua [name] (72 y.o. M) Primary Care Chauna Osoria: [name] Other Clinician: Referring Heaven Wandell: Treating Emiel Kielty/Extender: [name] in Treatment: 3 Wound Status Wound Number: 4 Primary Etiology: Diabetic Wound/Ulcer of the Lower Extremity Wound Location: Left Foot Wound Status: Open Wounding Event: Gradually Appeared Comorbid History: Cataracts, Hypertension, Type II Diabetes, Neuropathy Date Acquired: 09/24/2022 Weeks Of Treatment: 3 Clustered Wound: No JAKSYN, FESER [name] ([id number]) [phone].pdf Page 6 of 7 Pending Amputation On Presentation Photos Wound Measurements Length: (cm) 1 Width: (cm) 1 Depth: (cm) 0.4 Area: (cm) 0.785 Volume: (cm) 0.314 % Reduction in Area: -300.5% % Reduction in Volume: -129.2% Epithelialization: None Tunneling:  No Undermining: No Wound Description Classification: Grade 1 Wound Margin: Distinct, outline attached Exudate Amount: Medium Exudate Type: Serosanguineous Exudate Color: red, brown Foul Odor After Cleansing: Yes Due to Product Use: No Slough/Fibrino Yes Wound Bed Granulation Amount: Large (67-100%) Exposed Structure Granulation Quality: Red, Pink Fascia Exposed: No Necrotic Amount: None Present (0%) Fat Layer (Subcutaneous Tissue) Exposed: Yes Tendon Exposed: No Muscle Exposed: No Joint Exposed: No Bone Exposed: No Periwound Skin Texture Texture Color No Abnormalities Noted: No No Abnormalities Noted: No Callus: Yes Atrophie Blanche: No Crepitus: No Cyanosis: No Excoriation: No Ecchymosis: No Induration: No Erythema: No Rash: No Hemosiderin Staining: No Scarring: No Mottled: No Pallor: No Moisture Rubor: No No Abnormalities Noted: No Dry / Scaly: No Temperature / Pain Maceration: No Temperature: No Abnormality Tenderness on Palpation: Yes Treatment Notes Wound #4 (Foot) Wound Laterality: Left Cleanser Vashe 5.8 (oz) Discharge Instruction: Cleanse the wound with Vashe prior to applying [name] clean dressing using gauze sponges, not tissue or cotton balls. Peri-Wound Care Topical Gentamicin Discharge Instruction: As directed by physician Primary Dressing Hydrofera Blue Ready Transfer Foam, 4x5 (in/in) Discharge Instruction: Apply to wound bed as instructed Secondary Dressing Optifoam Non-Adhesive Dressing, 4x4 in Joshua [name], Joshua [name] ([id number]) [phone].pdf Page 7 of 7 Discharge Instruction: Apply foam donut over primary dressing as directed. Woven Gauze Sponge, Non-Sterile 4x4 in Discharge Instruction: Apply over primary dressing as directed. Secured With 106M Medipore H Soft Cloth Surgical T ape, 4 x [name] (in/yd) Discharge Instruction: Secure with tape as directed. Compression Wrap Compression Stockings Add-Ons Electronic  Signature(s) Signed: 02/16/2023 4:16:21 PM By: Erenest Blank Entered By: Erenest Blank on 02/12/2023 14:07:40 -------------------------------------------------------------------------------- Vitals Details Patient Name: Date of Service: Joshua Mane [name]. 02/12/2023 2:00 PM Medical Record Number: [id number] Patient Account Number: [id number] Date of Birth/Sex: Treating RN: [birth year]Joshua [name] (72 y.o. M) Primary Care Cosimo Schertzer: [name] Other Clinician: Referring Jazmeen Axtell: Treating Lei Dower/Extender: [name] in Treatment: 3 Vital Signs Time Taken: 13:55 Temperature (F): 98.2 Pulse (bpm): 94 Respiratory Rate (breaths/min): 18 Blood Pressure (mmHg): 150/87 Reference Range: 80 - 120 mg / dl Electronic Signature(s) Signed: 02/16/2023 4:16:21 PM By: Erenest Blank Entered By: Erenest Blank on 02/12/2023 13:55:36

## 2023-02-17 ENCOUNTER — Encounter (HOSPITAL_BASED_OUTPATIENT_CLINIC_OR_DEPARTMENT_OTHER): Payer: Medicare PPO | Admitting: Internal Medicine

## 2023-02-17 DIAGNOSIS — L97522 Non-pressure chronic ulcer of other part of left foot with fat layer exposed: Secondary | ICD-10-CM | POA: Diagnosis not present

## 2023-02-17 DIAGNOSIS — E11621 Type 2 diabetes mellitus with foot ulcer: Secondary | ICD-10-CM

## 2023-02-17 DIAGNOSIS — G250 Essential tremor: Secondary | ICD-10-CM | POA: Diagnosis not present

## 2023-02-24 ENCOUNTER — Encounter (HOSPITAL_BASED_OUTPATIENT_CLINIC_OR_DEPARTMENT_OTHER): Payer: Medicare PPO | Attending: General Surgery | Admitting: General Surgery

## 2023-02-24 DIAGNOSIS — G4733 Obstructive sleep apnea (adult) (pediatric): Secondary | ICD-10-CM | POA: Insufficient documentation

## 2023-02-24 DIAGNOSIS — Z794 Long term (current) use of insulin: Secondary | ICD-10-CM | POA: Diagnosis not present

## 2023-02-24 DIAGNOSIS — E11621 Type 2 diabetes mellitus with foot ulcer: Secondary | ICD-10-CM | POA: Diagnosis not present

## 2023-02-24 DIAGNOSIS — E1151 Type 2 diabetes mellitus with diabetic peripheral angiopathy without gangrene: Secondary | ICD-10-CM | POA: Diagnosis not present

## 2023-02-24 DIAGNOSIS — L97522 Non-pressure chronic ulcer of other part of left foot with fat layer exposed: Secondary | ICD-10-CM | POA: Insufficient documentation

## 2023-02-24 DIAGNOSIS — G250 Essential tremor: Secondary | ICD-10-CM | POA: Diagnosis not present

## 2023-02-24 DIAGNOSIS — Z87891 Personal history of nicotine dependence: Secondary | ICD-10-CM | POA: Insufficient documentation

## 2023-02-24 NOTE — Progress Notes (Signed)
Joshua [name] [name] ([id number]) [id number] Page 1 of 8 Visit Report for 02/24/2023 Chief Complaint Document Details Patient Name: Date of Service: Joshua [name] 02/24/2023 11:00 [name] Medical Record Number: [id number] Patient Account Number: [id number] Date of Birth/Sex: Treating RN: 03/01/1951 (72 y.o. M) Primary Care Provider: Shon [name] Other Clinician: Referring Provider: Treating Provider/Extender: Joshua [name] in Treatment: 4 Information Obtained from: Patient Chief Complaint 01/22/2023; left plantar diabetic foot ulcer Electronic Signature(s) Signed: 02/24/2023 12:30:43 PM By: Joshua Maudlin MD FACS Entered By: Joshua [name] on 02/24/2023 12:30:43 -------------------------------------------------------------------------------- HPI Details Patient Name: Date of Service: Joshua [name]. 02/24/2023 11:00 [name] Medical Record Number: [id number] Patient Account Number: [id number] Date of Birth/Sex: Treating RN: 1951-08-08 (72 y.o. M) Primary Care Provider: Shon [name] Other Clinician: Referring Provider: Treating Provider/Extender: Joshua [name] in Treatment: 4 History of Present Illness Location: left foot was the initial wound and then he has one on the right foot Quality: Patient reports No Pain. Severity: Patient states wound(s) are getting worse. Duration: Patient has had the wound for > 10 months prior to seeking treatment at the wound center Context: The wound would happen gradually Modifying Factors: Patient wound(s)/ulcer(s) are worsening due to : nonhealing nature in spite of various medications and procedures by the podiatrist Joshua [name] HPI Description: 01/22/2023 Joshua [name] is [name] With [name] past medical history of insulin-dependent type 2 diabetes with last hemoglobin 123456 of 7.9 complicated by peripheral neuropathy, essential tremors, and OSA that presents the clinic for [name]  3-71-month history of nonhealing ulcer to the plantar aspect of his left foot. He is not sure how it started but over time has progressively become larger. He is not using any offloading device. He has been placing [name] Band-Aid with antibiotic ointment to the wound bed. He has been following with Joshua [name] for this issue, podiatry. He last had an x-ray on 12/05/2022 that showed no definitive cortical destruction suggesting osteomyelitis. He currently denies systemic signs of infection. 3/7; patient presents for follow-up. He has been using Hydrofera Blue to the wound bed. He has been using his surgical shoe with peg assist for offloading. He has been taking his oral antibiotics. He has no issues or complaints today. 3/14; patient presents for follow-up. He has been using Hydrofera Blue with Medihoney to the wound bed. He has been using his surgical shoe with peg assist for offloading. He has no issues or complaints today. 3/19; patient presents for follow-up. He has been using Hydrofera Blue and Medihoney to the wound bed. Plan is for the total contact cast today. He has no issues or complaints today. 3/21; patient presents for obligatory cast change. We have been using Hydrofera Blue and antibiotic ointment under the total contact cast. He tolerated the cast well and has no issues or complaints today. 3/26; patient presents for follow-up. We have been using Hydrofera Blue and antibiotic ointment under the total contact cast. There has been improvement in wound healing. 02/24/2023: The wound is very nearly healed; just [name] tiny narrow opening remains. Electronic Signature(s) Signed: 02/24/2023 12:31:14 PM By: Joshua Maudlin MD FACS Entered By: Joshua [name] on 02/24/2023 12:31:13 Joshua [name] ([id number] Page 2 of 8 -------------------------------------------------------------------------------- Physical Exam Details Patient Name: Date of Service: Joshua [name] 02/24/2023 11:00 [name] Medical Record Number: [id number] Patient Account Number: [id number] Date of Birth/Sex: Treating RN: 1951-04-25 (72 y.o. M) Primary  Care Provider: Shon [name] Other Clinician: Referring Provider: Treating Provider/Extender: Joshua [name] in Treatment: 4 Constitutional Slightly hypertensive. . . . no acute distress. Respiratory Normal work of breathing on room air. Notes 02/24/2023: The wound is very nearly healed; just [name] tiny narrow opening remains. Electronic Signature(s) Signed: 02/24/2023 12:32:40 PM By: Joshua Maudlin MD FACS Entered By: Joshua [name] on 02/24/2023 12:32:39 -------------------------------------------------------------------------------- Physician Orders Details Patient Name: Date of Service: Joshua [name]. 02/24/2023 11:00 [name] Medical Record Number: [id number] Patient Account Number: [id number] Date of Birth/Sex: Treating RN: 1951-02-20 (72 y.o. Hessie Diener Primary Care Provider: Shon [name] Other Clinician: Referring Provider: Treating Provider/Extender: Joshua [name] in Treatment: 4 Verbal / Phone Orders: No Diagnosis Coding ICD-10 Coding Code Description [phone] Non-pressure chronic ulcer of other part of left foot with fat layer exposed E11.621 Type 2 diabetes mellitus with foot ulcer G25.0 Essential tremor Follow-up Appointments ppointment in 1 week. - w/ Dr. Heber [name] and Joshua [name] Rm # 9 Tuesday 03/03/23 @ 9:30 Return [name] Anesthetic (In clinic) Topical Lidocaine 5% applied to wound bed Bathing/ Shower/ Hygiene May shower with protection but do not get wound dressing(s) wet. Protect dressing(s) with water repellant cover (for example, large plastic bag) or [name] cast cover and may then take shower. Edema Control - Lymphedema / SCD / Other Elevate legs to the level of the heart or above for 30 minutes daily and/or when sitting for 3-4 times [name] day throughout the day. Avoid  standing for long periods of time. Off-Loading Total Contact Cast to Left Lower Extremity Wound Treatment Wound #4 - Foot Wound Laterality: Left Cleanser: Vashe 5.8 (oz) (Generic) 1 x Per Week/15 Days Discharge Instructions: Cleanse the wound with Vashe prior to applying [name] clean dressing using gauze sponges, not tissue or cotton balls. Topical: Gentamicin 1 x Per Week/15 Days Discharge Instructions: As directed by physician Prim Dressing: Hydrofera Blue Ready Transfer Foam, 4x5 (in/in) (Generic) 1 x Per Week/15 Days ary Discharge Instructions: Apply to wound bed as instructed Secondary Dressing: Optifoam Non-Adhesive Dressing, 4x4 in (Generic) 1 x Per Week/15 Days Discharge Instructions: Apply foam donut over primary dressing as directed. Joshua [name], Joshua [name] ([id number]) [id number] Page 3 of 8 Secondary Dressing: Woven Gauze Sponge, Non-Sterile 4x4 in (Generic) 1 x Per Week/15 Days Discharge Instructions: Apply over primary dressing as directed. Secured With: 41M Medipore H Soft Cloth Surgical T ape, 4 x 10 (in/yd) (Generic) 1 x Per Week/15 Days Discharge Instructions: Secure with tape as directed. Electronic Signature(s) Signed: 02/24/2023 12:34:56 PM By: Joshua Maudlin MD FACS Entered By: Joshua [name] on 02/24/2023 12:33:35 -------------------------------------------------------------------------------- Problem List Details Patient Name: Date of Service: Joshua [name]. 02/24/2023 11:00 [name] Medical Record Number: [id number] Patient Account Number: [id number] Date of Birth/Sex: Treating RN: 1951-10-16 (72 y.o. Hessie Diener Primary Care Provider: Shon [name] Other Clinician: Referring Provider: Treating Provider/Extender: Joshua [name] in Treatment: 4 Active Problems ICD-10 Encounter Code Description Active Date MDM Diagnosis [phone] Non-pressure chronic ulcer of other part of left foot with fat layer exposed 01/22/2023 No  Yes E11.621 Type 2 diabetes mellitus with foot ulcer 01/22/2023 No Yes G25.0 Essential tremor 01/22/2023 No Yes Inactive Problems Resolved Problems Electronic Signature(s) Signed: 02/24/2023 12:30:31 PM By: Joshua Maudlin MD FACS Entered By: Joshua [name] on 02/24/2023 12:30:30 -------------------------------------------------------------------------------- Progress Note Details Patient Name: Date of Service: Joshua [name]. 02/24/2023 11:00 [name] Medical Record Number: [id number] Patient Account Number:  [id number] Date of Birth/Sex: Treating RN: [birth year] (72 y.o. M) Primary Care Provider: Shon [name] Other Clinician: Referring Provider: Treating Provider/Extender: Joshua [name] in Treatment: 4 Subjective Chief Complaint Information obtained from Patient 01/22/2023; left plantar diabetic foot ulcer History of Present Illness (HPI) The following HPI elements were documented for the patient's wound: Location: left foot was the initial wound and then he has one on the right foot Quality: Patient reports No Pain. Joshua [name], Joshua [name] ([id number]) [id number] Page 4 of 8 Severity: Patient states wound(s) are getting worse. Duration: Patient has had the wound for > 10 months prior to seeking treatment at the wound center Context: The wound would happen gradually Modifying Factors: Patient wound(s)/ulcer(s) are worsening due to : nonhealing nature in spite of various medications and procedures by the podiatrist Joshua [name] 01/22/2023 Mr. Rizwan Mammone is [name] With [name] past medical history of insulin-dependent type 2 diabetes with last hemoglobin 123456 of 7.9 complicated by peripheral neuropathy, essential tremors, and OSA that presents the clinic for [name] 3-80-month history of nonhealing ulcer to the plantar aspect of his left foot. He is not sure how it started but over time has progressively become larger. He is not using any offloading  device. He has been placing [name] Band-Aid with antibiotic ointment to the wound bed. He has been following with Joshua [name] for this issue, podiatry. He last had an x-ray on 12/05/2022 that showed no definitive cortical destruction suggesting osteomyelitis. He currently denies systemic signs of infection. 3/7; patient presents for follow-up. He has been using Hydrofera Blue to the wound bed. He has been using his surgical shoe with peg assist for offloading. He has been taking his oral antibiotics. He has no issues or complaints today. 3/14; patient presents for follow-up. He has been using Hydrofera Blue with Medihoney to the wound bed. He has been using his surgical shoe with peg assist for offloading. He has no issues or complaints today. 3/19; patient presents for follow-up. He has been using Hydrofera Blue and Medihoney to the wound bed. Plan is for the total contact cast today. He has no issues or complaints today. 3/21; patient presents for obligatory cast change. We have been using Hydrofera Blue and antibiotic ointment under the total contact cast. He tolerated the cast well and has no issues or complaints today. 3/26; patient presents for follow-up. We have been using Hydrofera Blue and antibiotic ointment under the total contact cast. There has been improvement in wound healing. 02/24/2023: The wound is very nearly healed; just [name] tiny narrow opening remains. Patient History Information obtained from Patient. Family History Diabetes - Maternal Grandparents. Social History Former smoker, Alcohol Use - Moderate - 2 drinks weekly, Drug Use - No History, Caffeine Use - Daily. Medical History Eyes Patient has history of Cataracts - extraction bilat Denies history of Glaucoma, Optic Neuritis Ear/Nose/Mouth/Throat Denies history of Chronic sinus problems/congestion, Middle ear problems Hematologic/Lymphatic Denies history of Anemia, Hemophilia, Human Immunodeficiency Virus, Lymphedema, Sickle  Cell Disease Respiratory Denies history of Aspiration, Asthma, Chronic Obstructive Pulmonary Disease (COPD), Pneumothorax, Sleep Apnea, Tuberculosis Cardiovascular Patient has history of Hypertension Denies history of Angina, Arrhythmia, Congestive Heart Failure, Coronary Artery Disease, Deep Vein Thrombosis, Hypotension, Myocardial Infarction, Peripheral Arterial Disease, Peripheral Venous Disease, Phlebitis, Vasculitis Gastrointestinal Denies history of Cirrhosis , Colitis, Crohnoos, Hepatitis [name], Hepatitis B, Hepatitis C Endocrine Patient has history of Type II Diabetes Denies history of Type I Diabetes Genitourinary Denies history of End Stage Renal Disease Immunological  Denies history of Lupus Erythematosus, Raynaudoos, Scleroderma Integumentary (Skin) Denies history of History of Burn Musculoskeletal Denies history of Gout, Rheumatoid Arthritis, Osteoarthritis, Osteomyelitis Neurologic Patient has history of Neuropathy Denies history of Dementia, Quadriplegia, Paraplegia, Seizure Disorder Oncologic Denies history of Received Chemotherapy, Received Radiation Psychiatric Denies history of Anorexia/bulimia, Confinement Anxiety Hospitalization/Surgery History - appendectomy. - tonsillectomy. - vasectomy. - nasal septum surgery. Medical [name] Surgical History Notes nd Constitutional Symptoms (General Health) arthro knee surgery Cardiovascular h/o CVA , Neurologic central tremors Oncologic h/o melanoma CAGE, HOLLEMAN ([id number]) [id number] Page 5 of 8 Objective Constitutional Slightly hypertensive. no acute distress. Vitals Time Taken: 11:25 AM, Temperature: 98.5 F, Pulse: 79 bpm, Respiratory Rate: 20 breaths/min, Blood Pressure: 148/74 mmHg. Respiratory Normal work of breathing on room air. General Notes: 02/24/2023: The wound is very nearly healed; just [name] tiny narrow opening remains. Integumentary (Hair, Skin) Wound #4 status is Open.  Original cause of wound was Gradually Appeared. The date acquired was: 09/24/2022. The wound has been in treatment 4 weeks. The wound is located on the Left Foot. The wound measures 0.1cm length x 0.1cm width x 0.1cm depth; 0.008cm^2 area and 0.001cm^3 volume. There is no tunneling or undermining noted. There is [name] none present amount of drainage noted. The wound margin is distinct with the outline attached to the wound base. There is no granulation within the wound bed. There is no necrotic tissue within the wound bed. The periwound skin appearance exhibited: Callus. The periwound skin appearance did not exhibit: Crepitus, Excoriation, Induration, Rash, Scarring, Dry/Scaly, Maceration, Atrophie Blanche, Cyanosis, Ecchymosis, Hemosiderin Staining, Mottled, Pallor, Rubor, Erythema. Periwound temperature was noted as No Abnormality. The periwound has tenderness on palpation. Assessment Active Problems ICD-10 Non-pressure chronic ulcer of other part of left foot with fat layer exposed Type 2 diabetes mellitus with foot ulcer Essential tremor Procedures Wound #4 Pre-procedure diagnosis of Wound #4 is [name] Diabetic Wound/Ulcer of the Lower Extremity located on the Left Foot . There was [name] T Contact Cast Procedure otal by Joshua Maudlin, MD. Post procedure Diagnosis Wound #4: Same as Pre-Procedure Plan Follow-up Appointments: Return Appointment in 1 week. - w/ Dr. Heber Octavia and Joshua [name] Rm # 9 Tuesday 03/03/23 @ 9:30 Anesthetic: (In clinic) Topical Lidocaine 5% applied to wound bed Bathing/ Shower/ Hygiene: May shower with protection but do not get wound dressing(s) wet. Protect dressing(s) with water repellant cover (for example, large plastic bag) or [name] cast cover and may then take shower. Edema Control - Lymphedema / SCD / Other: Elevate legs to the level of the heart or above for 30 minutes daily and/or when sitting for 3-4 times [name] day throughout the day. Avoid standing for long periods of  time. Off-Loading: T Contact Cast to Left Lower Extremity otal WOUND #4: - Foot Wound Laterality: Left Cleanser: Vashe 5.8 (oz) (Generic) 1 x Per Week/15 Days Discharge Instructions: Cleanse the wound with Vashe prior to applying [name] clean dressing using gauze sponges, not tissue or cotton balls. Topical: Gentamicin 1 x Per Week/15 Days Discharge Instructions: As directed by physician Prim Dressing: Hydrofera Blue Ready Transfer Foam, 4x5 (in/in) (Generic) 1 x Per Week/15 Days ary Discharge Instructions: Apply to wound bed as instructed Secondary Dressing: Optifoam Non-Adhesive Dressing, 4x4 in (Generic) 1 x Per Week/15 Days Discharge Instructions: Apply foam donut over primary dressing as directed. Secondary Dressing: Woven Gauze Sponge, Non-Sterile 4x4 in (Generic) 1 x Per Week/15 Days Discharge Instructions: Apply over primary dressing as directed. Secured With: 43M Medipore [employer] operational [employer] Surgical T  ape, 4 x 10 (in/yd) (Generic) 1 x Per Week/15 Days Discharge Instructions: Secure with tape as directed. Joshua [name], Joshua [name] ([id number]) [id number] Page 6 of 8 02/24/2023: The wound is very nearly healed; just [name] tiny narrow opening remains. The wound bed was prepared for total contact casting. T opical gentamicin and Hydrofera Blue were applied. [name] total contact cast was then applied in standard fashion without difficulty or complication. He will follow-up in 1 week, at which time I anticipate he will likely be completely healed. Electronic Signature(s) Signed: 02/24/2023 12:34:11 PM By: Joshua Maudlin MD FACS Entered By: Joshua [name] on 02/24/2023 12:34:11 -------------------------------------------------------------------------------- HxROS Details Patient Name: Date of Service: Joshua Joshua Cheng [name]. 02/24/2023 11:00 [name] Medical Record Number: [id number] Patient Account Number: [id number] Date of Birth/Sex: Treating RN: November 10, 1951 (72 y.o. M) Primary Care Provider:  Shon [name] Other Clinician: Referring Provider: Treating Provider/Extender: Joshua [name] in Treatment: 4 Information Obtained From Patient Constitutional Symptoms (General Health) Medical History: Past Medical History Notes: arthro knee surgery Eyes Medical History: Positive for: Cataracts - extraction bilat Negative for: Glaucoma; Optic Neuritis Ear/Nose/Mouth/Throat Medical History: Negative for: Chronic sinus problems/congestion; Middle ear problems Hematologic/Lymphatic Medical History: Negative for: Anemia; Hemophilia; Human Immunodeficiency Virus; Lymphedema; Sickle Cell Disease Respiratory Medical History: Negative for: Aspiration; Asthma; Chronic Obstructive Pulmonary Disease (COPD); Pneumothorax; Sleep Apnea; Tuberculosis Cardiovascular Medical History: Positive for: Hypertension Negative for: Angina; Arrhythmia; Congestive Heart Failure; Coronary Artery Disease; Deep Vein Thrombosis; Hypotension; Myocardial Infarction; Peripheral Arterial Disease; Peripheral Venous Disease; Phlebitis; Vasculitis Past Medical History Notes: h/o CVA , Gastrointestinal Medical History: Negative for: Cirrhosis ; Colitis; Crohns; Hepatitis [name]; Hepatitis B; Hepatitis C Endocrine Medical History: Positive for: Type II Diabetes Negative for: Type I Diabetes Time with diabetes: since 2010 Treated with: Insulin Blood sugar tested every day: Yes Tested : daily Blood sugar testing results: Breakfast: 142 Joshua [name], Joshua [name] ([id number]) [phone].pdf Page 7 of 8 Genitourinary Medical History: Negative for: End Stage Renal Disease Immunological Medical History: Negative for: Lupus Erythematosus; Raynauds; Scleroderma Integumentary (Skin) Medical History: Negative for: History of Burn Musculoskeletal Medical History: Negative for: Gout; Rheumatoid Arthritis; Osteoarthritis; Osteomyelitis Neurologic Medical History: Positive for:  Neuropathy Negative for: Dementia; Quadriplegia; Paraplegia; Seizure Disorder Past Medical History Notes: central tremors Oncologic Medical History: Negative for: Received Chemotherapy; Received Radiation Past Medical History Notes: h/o melanoma Psychiatric Medical History: Negative for: Anorexia/bulimia; Confinement Anxiety HBO Extended History Items Eyes: Cataracts Immunizations Pneumococcal Vaccine: Received Pneumococcal Vaccination: No Immunization Notes: unknown Implantable Devices No devices added Hospitalization / Surgery History Type of Hospitalization/Surgery appendectomy tonsillectomy vasectomy nasal septum surgery Family and Social History Diabetes: Yes - Maternal Grandparents; Former smoker; Alcohol Use: Moderate - 2 drinks weekly; Drug Use: No History; Caffeine Use: Daily; Financial Concerns: No; Food, Clothing or Shelter Needs: No; Support System Lacking: No; Transportation Concerns: No Electronic Signature(s) Signed: 02/24/2023 12:34:56 PM By: Joshua Maudlin MD FACS Entered By: Joshua [name] on 02/24/2023 12:32:17 -------------------------------------------------------------------------------- Total Contact Cast Details Patient Name: Date of Service: Joshua [name]. 02/24/2023 11:00 [name] Medical Record Number: [id number] Patient Account Number: [id number] Joshua [name], Joshua [name] ([id number]) [phone].pdf Page 8 of 8 Date of Birth/Sex: Treating RN: April 25, 1951 (72 y.o. Hessie Diener Primary Care Provider: Other Clinician: Shon [name] Referring Provider: Treating Provider/Extender: Joshua [name] in Treatment: 4 T Contact Cast Applied for Wound Assessment: otal Wound #4 Left Foot Performed By: Physician Joshua Maudlin, MD Post Procedure Diagnosis Same as Pre-procedure Electronic Signature(s) Signed: 02/24/2023 12:34:56 PM  By: Joshua Maudlin MD FACS Signed: 02/24/2023 4:09:50 PM By: Deon Pilling RN,  BSN Entered By: Deon Pilling on 02/24/2023 11:43:10 -------------------------------------------------------------------------------- SuperBill Details Patient Name: Date of Service: Joshua Joshua Cheng [name]. 02/24/2023 Medical Record Number: [id number] Patient Account Number: [id number] Date of Birth/Sex: Treating RN: 02-21-1951 (72 y.o. Joshua [name], Joshua [name] Primary Care Provider: Shon [name] Other Clinician: Referring Provider: Treating Provider/Extender: Joshua [name] in Treatment: 4 Diagnosis Coding ICD-10 Codes Code Description [phone] Non-pressure chronic ulcer of other part of left foot with fat layer exposed E11.621 Type 2 diabetes mellitus with foot ulcer G25.0 Essential tremor Facility Procedures : CPT4 Code: [id number] Description: [phone] - APPLY TOTAL CONTACT LEG CAST ICD-10 Diagnosis Description L97.522 Non-pressure chronic ulcer of other part of left foot with fat layer exposed Modifier: Quantity: 1 Physician Procedures : CPT4 Code Description Modifier [id number] - WC PHYS LEVEL 4 - EST PT ICD-10 Diagnosis Description L97.522 Non-pressure chronic ulcer of other part of left foot with fat layer exposed E11.621 Type 2 diabetes mellitus with foot ulcer Quantity: 1 : [id number] - WC PHYS APPLY TOTAL CONTACT CAST ICD-10 Diagnosis Description L97.522 Non-pressure chronic ulcer of other part of left foot with fat layer exposed Quantity: 1 Electronic Signature(s) Signed: 02/24/2023 12:34:25 PM By: Joshua Maudlin MD FACS Entered By: Joshua [name] on 02/24/2023 12:34:25

## 2023-02-24 NOTE — Progress Notes (Signed)
Joshua [name], Joshua [name] ([id number]) [id number] Page 1 of 7 Visit Report for 02/24/2023 Arrival Information Details Patient Name: Date of Service: Joshua [name] 02/24/2023 11:00 [name] Medical Record Number: [id number] Patient Account Number: [id number] Date of Birth/Sex: Treating RN: 08/01/1951 (72 y.o. Joshua [name] Primary Care Joshua [name]: Joshua [name] Other Clinician: Referring Joshua [name]: Treating Joshua [name]/Extender: Joshua [name] in Treatment: 4 Visit Information History Since Last Visit Added or deleted any medications: No Patient Arrived: Joshua [name] Any new allergies or adverse reactions: No Arrival Time: 11:26 Had [name] fall or experienced change in No Accompanied By: self activities of daily living that may affect Transfer Assistance: None risk of falls: Patient Identification Verified: Yes Signs or symptoms of abuse/neglect since last visito No Secondary Verification Process Completed: Yes Hospitalized since last visit: No Patient Requires Transmission-Based Precautions: No Implantable device outside of the clinic excluding No Patient Has Alerts: Yes cellular tissue based products placed in the center since last visit: Has Dressing in Place as Prescribed: Yes Has Footwear/Offloading in Place as Prescribed: Yes Left: T Contact Cast otal Pain Present Now: No Electronic Signature(s) Signed: 02/24/2023 4:09:50 PM By: Joshua Pilling RN, BSN Entered By: Joshua [name] on 02/24/2023 11:27:03 -------------------------------------------------------------------------------- Encounter Discharge Information Details Patient Name: Date of Service: Joshua [name]. 02/24/2023 11:00 [name] Medical Record Number: [id number] Patient Account Number: [id number] Date of Birth/Sex: Treating RN: [birth year] (72 y.o. Joshua [name] Primary Care Jadelin Eng: Joshua [name] Other Clinician: Referring Nazli Penn: Treating Joshua [name]/Extender: Joshua [name] in Treatment: 4 Encounter Discharge Information Items Discharge Condition: Stable Ambulatory Status: Walker Discharge Destination: Home Transportation: Private Auto Accompanied By: self Schedule Follow-up Appointment: Yes Clinical Summary of Care: Electronic Signature(s) Signed: 02/24/2023 4:09:50 PM By: Joshua Pilling RN, BSN Entered By: Joshua [name] on 02/24/2023 12:03:14 -------------------------------------------------------------------------------- Lower Extremity Assessment Details Patient Name: Date of Service: Joshua Joshua Cheng [name]. 02/24/2023 11:00 [name] Medical Record Number: [id number] Patient Account Number: [id number] Date of Birth/Sex: Treating RN: Jan 04, 1951 (72 y.o. Joshua [name] Primary Care Joshua [name]: Joshua [name] Other Clinician: Referring Jasreet Dickie: Treating Joshua [name]/Extender: Joshua [name] in Treatment: 4 Edema Assessment Left: [Left: Right] [Right: :] Assessed: [Left: Yes] [Right: No] Edema: [Left: N] [Right: o] Calf Left: Right: Point of Measurement: 40 cm From Medial Instep 40.5 cm Ankle Left: Right: Point of Measurement: 11 cm From Medial Instep 25 cm Vascular Assessment Pulses: Dorsalis Pedis Palpable: [Left:Yes] Electronic Signature(s) Signed: 02/24/2023 4:09:50 PM By: Joshua Pilling RN, BSN Entered By: Joshua [name] on 02/24/2023 11:31:54 -------------------------------------------------------------------------------- Multi Wound Chart Details Patient Name: Date of Service: Joshua [name]. 02/24/2023 11:00 [name] Medical Record Number: [id number] Patient Account Number: [id number] Date of Birth/Sex: Treating RN: 04-08-1951 (72 y.o. M) Primary Care Joshua [name]: Joshua [name] Other Clinician: Referring Joshua [name]: Treating Joshua [name]/Extender: Joshua [name] in Treatment: 4 Vital Signs Height(in): Pulse(bpm): 26 Weight(lbs): Blood Pressure(mmHg): 148/74 Body Mass Index(BMI): Temperature(F):  98.5 Respiratory Rate(breaths/min): 20 [4:Photos:] [[name]:[name]] Left Foot [name] [name] Wound Location: Gradually Appeared [name] [name] Wounding Event: Diabetic Wound/Ulcer of the Lower [name] [name] Primary Etiology: Extremity Cataracts, Hypertension, Type II [name] [name] Comorbid History: Diabetes, Neuropathy 09/24/2022 [name] [name] Date Acquired: 4 [name] [name] Weeks of Treatment: Open [name] [name] Wound Status: No [name] [name] Wound Recurrence: Yes [name] [name] Pending [name] mputation on Presentation: 0.1x0.1x0.1 [name] [name] Measurements L x W x D (cm) 0.008 [name] [name] [name] (cm) : rea 0.001 [name] [name] Volume (cm) :  95.90% [name] [name] % Reduction in [name] rea: 99.30% [name] [name] % Reduction in Volume: Grade 1 [name] [name] Classification: None Present [name] [name] Exudate [name] mount: Distinct, outline attached [name] [name] Wound Margin: None Present (0%) [name] [name] Granulation [name] mount: None Present (0%) [name] [name] Necrotic [name] mount: Fascia: No [name] [name] Exposed Structures: Joshua [name] [name] ([id number]) [phone].pdf Page 3 of 7 Fat Layer (Subcutaneous Tissue): No Tendon: No Muscle: No Joint: No Bone: No Large (67-100%) [name] [name] Epithelialization: Callus: Yes [name] [name] Periwound Skin Texture: Excoriation: No Induration: No Crepitus: No Rash: No Scarring: No Maceration: No [name] [name] Periwound Skin Moisture: Dry/Scaly: No Atrophie Blanche: No [name] [name] Periwound Skin Color: Cyanosis: No Ecchymosis: No Erythema: No Hemosiderin Staining: No Mottled: No Pallor: No Rubor: No No Abnormality [name] [name] Temperature: Yes [name] [name] Tenderness on Palpation: T Contact Cast otal [name] [name] Procedures Performed: Treatment Notes Wound #4 (Foot) Wound Laterality: Left Cleanser Vashe 5.8 (oz) Discharge Instruction: Cleanse the wound with Vashe prior to applying [name] clean dressing using gauze sponges, not tissue or cotton balls. Peri-Wound Care Topical Gentamicin Discharge Instruction: As directed by physician Primary Dressing Hydrofera Blue  Ready Transfer Foam, 4x5 (in/in) Discharge Instruction: Apply to wound bed as instructed Secondary Dressing Optifoam Non-Adhesive Dressing, 4x4 in Discharge Instruction: Apply foam donut over primary dressing as directed. Woven Gauze Sponge, Non-Sterile 4x4 in Discharge Instruction: Apply over primary dressing as directed. Secured With 19M Medipore H Soft Cloth Surgical T ape, 4 x 10 (in/yd) Discharge Instruction: Secure with tape as directed. Compression Wrap Compression Stockings Add-Ons Electronic Signature(s) Signed: 02/24/2023 12:30:36 PM By: Fredirick Maudlin MD FACS Entered By: Fredirick Maudlin on 02/24/2023 12:30:36 -------------------------------------------------------------------------------- Multi-Disciplinary Care Plan Details Patient Name: Date of Service: Joshua [name]. 02/24/2023 11:00 [name] Medical Record Number: [id number] Patient Account Number: [id number] Date of Birth/Sex: Treating RN: 1951/03/02 (72 y.o. Joshua [name] Primary Care Tyasia Packard: Joshua [name] Other Clinician: Referring Genny Caulder: Treating Adrin Julian/Extender: Joshua [name] in Treatment: 4 Joshua [name], Joshua [name] ([id number]) [id number] Page 4 of 7 Active Inactive Wound/Skin Impairment Nursing Diagnoses: Impaired tissue integrity Knowledge deficit related to ulceration/compromised skin integrity Goals: Patient will have [name] decrease in wound volume by X% from date: (specify in notes) Date Initiated: 01/22/2023 Target Resolution Date: 03/13/2023 Goal Status: Active Patient/caregiver will verbalize understanding of skin care regimen Date Initiated: 01/22/2023 Target Resolution Date: 03/20/2023 Goal Status: Active Ulcer/skin breakdown will have [name] volume reduction of 30% by week 4 Date Initiated: 01/22/2023 Date Inactivated: 02/24/2023 Target Resolution Date: 02/21/2023 Goal Status: Met Interventions: Assess patient/caregiver ability to obtain necessary  supplies Assess patient/caregiver ability to perform ulcer/skin care regimen upon admission and as needed Assess ulceration(s) every visit Notes: Electronic Signature(s) Signed: 02/24/2023 4:09:50 PM By: Joshua Pilling RN, BSN Entered By: Joshua [name] on 02/24/2023 11:35:24 -------------------------------------------------------------------------------- Pain Assessment Details Patient Name: Date of Service: Joshua [name]. 02/24/2023 11:00 [name] Medical Record Number: [id number] Patient Account Number: [id number] Date of Birth/Sex: Treating RN: January 22, 1951 (72 y.o. Joshua [name] Primary Care Ireoluwa Gorsline: Joshua [name] Other Clinician: Referring Josetta Wigal: Treating Ronita Hargreaves/Extender: Joshua [name] in Treatment: 4 Active Problems Location of Pain Severity and Description of Pain Patient Has Paino No Site Locations Rate the pain. Current Pain Level: 0 Pain Management and Medication Current Pain Management: Medication: No Cold Application: No Rest: No Massage: No Activity: No T.E.N.S.: No Heat Application: No Leg drop or elevation: No Is the Current Pain Management Adequate: Adequate How  does your wound impact your activities of daily Joshua [name], Joshua [name] ([id number]) [id number] Page 5 of 7 Sleep: No Bathing: No Appetite: No Relationship With Others: No Bladder Continence: No Emotions: No Bowel Continence: No Work: No Toileting: No Drive: No Dressing: No Hobbies: No Engineer, maintenance) Signed: 02/24/2023 4:09:50 PM By: Joshua Pilling RN, BSN Entered By: Joshua [name] on 02/24/2023 11:27:37 -------------------------------------------------------------------------------- Patient/Caregiver Education Details Patient Name: Date of Service: Joshua [name] 4/2/2024andnbsp11:00 [name] Medical Record Number: [id number] Patient Account Number: [id number] Date of Birth/Gender: Treating RN: 27-Apr-1951 (72 y.o. Joshua [name] Primary Care Physician: Joshua [name] Other Clinician: Referring Physician: Treating Physician/Extender: Joshua [name] in Treatment: 4 Education Assessment Education Provided To: Patient Education Topics Provided Wound/Skin Impairment: Handouts: Caring for Your Ulcer Methods: Explain/Verbal Responses: Reinforcements needed Electronic Signature(s) Signed: 02/24/2023 4:09:50 PM By: Joshua Pilling RN, BSN Entered By: Joshua [name] on 02/24/2023 11:35:36 -------------------------------------------------------------------------------- Wound Assessment Details Patient Name: Date of Service: Joshua [name]. 02/24/2023 11:00 [name] Medical Record Number: [id number] Patient Account Number: [id number] Date of Birth/Sex: Treating RN: [birth year] (72 y.o. Joshua [name] Primary Care Marguis Mathieson: Joshua [name] Other Clinician: Referring Laymond Postle: Treating Deari Sessler/Extender: Joshua [name] in Treatment: 4 Wound Status Wound Number: 4 Primary Etiology: Diabetic Wound/Ulcer of the Lower Extremity Wound Location: Left Foot Wound Status: Open Wounding Event: Gradually Appeared Comorbid History: Cataracts, Hypertension, Type II Diabetes, Neuropathy Date Acquired: 09/24/2022 Weeks Of Treatment: 4 Clustered Wound: No Pending Amputation On Presentation Photos Joshua [name], Joshua [name] ([id number]) [id number] Page 6 of 7 Wound Measurements Length: (cm) 0.1 Width: (cm) 0.1 Depth: (cm) 0.1 Area: (cm) 0.008 Volume: (cm) 0.001 % Reduction in Area: 95.9% % Reduction in Volume: 99.3% Epithelialization: Large (67-100%) Tunneling: No Undermining: No Wound Description Classification: Grade 1 Wound Margin: Distinct, outline attached Exudate Amount: None Present Foul Odor After Cleansing: No Slough/Fibrino No Wound Bed Granulation Amount: None Present (0%) Exposed Structure Necrotic Amount: None Present (0%) Fascia Exposed: No Fat  Layer (Subcutaneous Tissue) Exposed: No Tendon Exposed: No Muscle Exposed: No Joint Exposed: No Bone Exposed: No Periwound Skin Texture Texture Color No Abnormalities Noted: No No Abnormalities Noted: No Callus: Yes Atrophie Blanche: No Crepitus: No Cyanosis: No Excoriation: No Ecchymosis: No Induration: No Erythema: No Rash: No Hemosiderin Staining: No Scarring: No Mottled: No Pallor: No Moisture Rubor: No No Abnormalities Noted: No Dry / Scaly: No Temperature / Pain Maceration: No Temperature: No Abnormality Tenderness on Palpation: Yes Treatment Notes Wound #4 (Foot) Wound Laterality: Left Cleanser Vashe 5.8 (oz) Discharge Instruction: Cleanse the wound with Vashe prior to applying [name] clean dressing using gauze sponges, not tissue or cotton balls. Peri-Wound Care Topical Gentamicin Discharge Instruction: As directed by physician Primary Dressing Hydrofera Blue Ready Transfer Foam, 4x5 (in/in) Discharge Instruction: Apply to wound bed as instructed Secondary Dressing Optifoam Non-Adhesive Dressing, 4x4 in Discharge Instruction: Apply foam donut over primary dressing as directed. Woven Gauze Sponge, Non-Sterile 4x4 in Discharge Instruction: Apply over primary dressing as directed. Joshua [name], Joshua [name] ([id number]) [id number] Page 7 of 7 Secured With 15M Medipore H Soft Cloth Surgical T ape, 4 x 10 (in/yd) Discharge Instruction: Secure with tape as directed. Compression Wrap Compression Stockings Add-Ons Electronic Signature(s) Signed: 02/24/2023 1:02:24 PM By: Sandre Kitty Signed: 02/24/2023 4:09:50 PM By: Joshua Pilling RN, BSN Entered By: Sandre Kitty on 02/24/2023 11:33:32 -------------------------------------------------------------------------------- Vitals Details Patient Name: Date of Service: Joshua [name]. 02/24/2023 11:00 [name] Medical Record Number:  [id number] Patient Account Number: [id number] Date of Birth/Sex:  Treating RN: 11/03/1951 (72 y.o. Joshua [name] Primary Care Dannell Raczkowski: Joshua [name] Other Clinician: Referring Courtenay Hirth: Treating Micky Overturf/Extender: Joshua [name] in Treatment: 4 Vital Signs Time Taken: 11:25 Temperature (F): 98.5 Pulse (bpm): 79 Respiratory Rate (breaths/min): 20 Blood Pressure (mmHg): 148/74 Reference Range: 80 - 120 mg / dl Electronic Signature(s) Signed: 02/24/2023 4:09:50 PM By: Joshua Pilling RN, BSN Entered By: Joshua [name] on 02/24/2023 11:27:29

## 2023-02-25 NOTE — Progress Notes (Signed)
ROWELL, FOSNIGHT A ([id number]) [id number] Page 1 of 9 Visit Report for 02/17/2023 Chief Complaint Document Details Patient Name: Date of Service: Joshua [name] 02/17/2023 9:00 A M Medical Record Number: [id number] Patient Account Number: [id number] Date of Birth/Sex: Treating RN: [birth year] (72 y.o. M) Primary Care Provider: [name] Other Clinician: Referring Provider: Treating Provider/Extender: [name] in Treatment: 3 Information Obtained from: Patient Chief Complaint 01/22/2023; left plantar diabetic foot ulcer Electronic Signature(s) Signed: 02/17/2023 10:32:58 AM By: Kalman Shan DO Entered By: Kalman Shan on 02/17/2023 09:51:45 -------------------------------------------------------------------------------- Debridement Details Patient Name: Date of Service: Joshua Mane A. 02/17/2023 9:00 A M Medical Record Number: [id number] Patient Account Number: [id number] Date of Birth/Sex: Treating RN: Sep 02, 1951 (71 y.o. [name], [name] Primary Care Provider: [name] Other Clinician: Referring Provider: Treating Provider/Extender: [name] in Treatment: 3 Debridement Performed for Assessment: Wound #4 Left Foot Performed By: Physician Kalman Shan, DO Debridement Type: Debridement Severity of Tissue Pre Debridement: Fat layer exposed Level of Consciousness (Pre-procedure): Awake and Alert Pre-procedure Verification/Time Out Yes - 09:32 Taken: Start Time: 09:32 Pain Control: Lidocaine T Area Debrided (L x W): otal 0.3 (cm) x 0.3 (cm) = 0.09 (cm) Tissue and other material debrided: Viable, Non-Viable, Callus, Slough, Subcutaneous, Slough Level: Skin/Subcutaneous Tissue Debridement Description: Excisional Instrument: Curette Bleeding: Minimum Hemostasis Achieved: Pressure End Time: 09:32 Procedural Pain: 0 Post Procedural Pain: 0 Response to Treatment: Procedure  was tolerated well Level of Consciousness (Post- Awake and Alert procedure): Post Debridement Measurements of Total Wound Length: (cm) 0.3 Width: (cm) 0.3 Depth: (cm) 0.1 Volume: (cm) 0.007 Character of Wound/Ulcer Post Debridement: Improved Severity of Tissue Post Debridement: Fat layer exposed Post Procedure Diagnosis Same as Pre-procedure Electronic Signature(s) Signed: 02/17/2023 10:32:58 AM By: Kalman Shan DO Signed: 02/25/2023 4:26:45 PM By: Rhae Hammock RN Entered By: Rhae Hammock on 02/17/2023 09:32:59 [name] ([id number].pdf Page 2 of 9 -------------------------------------------------------------------------------- HPI Details Patient Name: Date of Service: Joshua [name] 02/17/2023 9:00 A M Medical Record Number: [id number] Patient Account Number: [id number] Date of Birth/Sex: Treating RN: Jan 07, 1951 (72 y.o. M) Primary Care Provider: [name] Other Clinician: Referring Provider: Treating Provider/Extender: [name] in Treatment: 3 History of Present Illness Location: left foot was the initial wound and then he has one on the right foot Quality: Patient reports No Pain. Severity: Patient states wound(s) are getting worse. Duration: Patient has had the wound for > 10 months prior to seeking treatment at the wound center Context: The wound would happen gradually Modifying Factors: Patient wound(s)/ulcer(s) are worsening due to : nonhealing nature in spite of various medications and procedures by the podiatrist Dr. Earleen Newport HPI Description: 01/22/2023 [name] is a 72 year old male With a past medical history of insulin-dependent type 2 diabetes with last hemoglobin 123456 of 7.9 complicated by peripheral neuropathy, essential tremors, and OSA that presents the clinic for a 3-40-month history of nonhealing ulcer to the plantar aspect of his left foot. He is not sure how it  started but over time has progressively become larger. He is not using any offloading device. He has been placing a Band-Aid with antibiotic ointment to the wound bed. He has been following with Dr. Earleen Newport for this issue, podiatry. He last had an x-ray on 12/05/2022 that showed no definitive cortical destruction suggesting osteomyelitis. He currently denies systemic signs of infection. 3/7; patient presents for follow-up. He has been using  Hydrofera Blue to the wound bed. He has been using his surgical shoe with peg assist for offloading. He has been taking his oral antibiotics. He has no issues or complaints today. 3/14; patient presents for follow-up. He has been using Hydrofera Blue with Medihoney to the wound bed. He has been using his surgical shoe with peg assist for offloading. He has no issues or complaints today. 3/19; patient presents for follow-up. He has been using Hydrofera Blue and Medihoney to the wound bed. Plan is for the total contact cast today. He has no issues or complaints today. 3/21; patient presents for obligatory cast change. We have been using Hydrofera Blue and antibiotic ointment under the total contact cast. He tolerated the cast well and has no issues or complaints today. 3/26; patient presents for follow-up. We have been using Hydrofera Blue and antibiotic ointment under the total contact cast. There has been improvement in wound healing. Electronic Signature(s) Signed: 02/17/2023 10:32:58 AM By: Kalman Shan DO Entered By: Kalman Shan on 02/17/2023 09:52:18 -------------------------------------------------------------------------------- Physical Exam Details Patient Name: Date of Service: Joshua Joshua Cheng A. 02/17/2023 9:00 A M Medical Record Number: [id number] Patient Account Number: [id number] Date of Birth/Sex: Treating RN: May 27, 1951 (72 y.o. M) Primary Care Provider: [name] Other Clinician: Referring Provider: Treating Provider/Extender:  [name] in Treatment: 3 Constitutional respirations regular, non-labored and within target range for patient.. Cardiovascular 2+ dorsalis pedis/posterior tibialis pulses. Psychiatric pleasant and cooperative. Notes Left foot: T the plantar/medial aspect there is an open wound with granulation tissue, nonviable tissue and callus. No increased warmth, erythema or purulent o drainage. Electronic Signature(s) Signed: 02/17/2023 10:32:58 AM By: Kalman Shan DO Entered By: Kalman Shan on 02/17/2023 09:52:48 [name] ([id number].pdf Page 3 of 9 -------------------------------------------------------------------------------- Physician Orders Details Patient Name: Date of Service: Joshua [name] 02/17/2023 9:00 A M Medical Record Number: [id number] Patient Account Number: [id number] Date of Birth/Sex: Treating RN: [birth year] (72 y.o. [name], [name] Primary Care Provider: [name] Other Clinician: Referring Provider: Treating Provider/Extender: [name] in Treatment: 3 Verbal / Phone Orders: No Diagnosis Coding Follow-up Appointments ppointment in 1 week. - w/ Dr. Celine Ahr and Bobbi Rm # 4 on Tuesday 02/24/23 @ 11:00 Return A ppointment in 2 weeks. - w/ Dr. Heber Bandon and Allayne Butcher Rm # 9 Tuesday 03/03/23 @ 9:30 Return A Anesthetic (In clinic) Topical Lidocaine 5% applied to wound bed Bathing/ Shower/ Hygiene May shower with protection but do not get wound dressing(s) wet. Protect dressing(s) with water repellant cover (for example, large plastic bag) or a cast cover and may then take shower. Edema Control - Lymphedema / SCD / Other Elevate legs to the level of the heart or above for 30 minutes daily and/or when sitting for 3-4 times a day throughout the day. Avoid standing for long periods of time. Off-Loading Total Contact Cast to Left Lower Extremity Wound  Treatment Wound #4 - Foot Wound Laterality: Left Cleanser: Vashe 5.8 (oz) (Generic) 1 x Per Week/15 Days Discharge Instructions: Cleanse the wound with Vashe prior to applying a clean dressing using gauze sponges, not tissue or cotton balls. Topical: Gentamicin 1 x Per Week/15 Days Discharge Instructions: As directed by physician Prim Dressing: Hydrofera Blue Ready Transfer Foam, 4x5 (in/in) (Generic) 1 x Per Week/15 Days ary Discharge Instructions: Apply to wound bed as instructed Secondary Dressing: Optifoam Non-Adhesive Dressing, 4x4 in (Generic) 1 x Per Week/15 Days Discharge Instructions: Apply foam donut over primary  dressing as directed. Secondary Dressing: Woven Gauze Sponge, Non-Sterile 4x4 in (Generic) 1 x Per Week/15 Days Discharge Instructions: Apply over primary dressing as directed. Secured With: 70M Medipore H Soft Cloth Surgical T ape, 4 x 10 (in/yd) (Generic) 1 x Per Week/15 Days Discharge Instructions: Secure with tape as directed. Electronic Signature(s) Signed: 02/17/2023 10:32:58 AM By: Kalman Shan DO Entered By: Kalman Shan on 02/17/2023 09:52:55 -------------------------------------------------------------------------------- Problem List Details Patient Name: Date of Service: Joshua Mane A. 02/17/2023 9:00 A M Medical Record Number: [id number] Patient Account Number: [id number] Date of Birth/Sex: Treating RN: May 13, 1951 (72 y.o. M) Primary Care Provider: [name] Other Clinician: Referring Provider: Treating Provider/Extender: [name] in Treatment: 3 Active Problems ICD-10 Encounter Code Description Active Date MDM Diagnosis NAZEER, TINKER A ([id number]) [id number] Page 4 of 9 [phone] Non-pressure chronic ulcer of other part of left foot with fat layer exposed 01/22/2023 No Yes E11.621 Type 2 diabetes mellitus with foot ulcer 01/22/2023 No Yes G25.0 Essential tremor 01/22/2023 No  Yes Inactive Problems Resolved Problems Electronic Signature(s) Signed: 02/17/2023 10:32:58 AM By: Kalman Shan DO Entered By: Kalman Shan on 02/17/2023 09:51:33 -------------------------------------------------------------------------------- Progress Note Details Patient Name: Date of Service: Joshua Joshua Cheng A. 02/17/2023 9:00 A M Medical Record Number: [id number] Patient Account Number: [id number] Date of Birth/Sex: Treating RN: [birth year] (72 y.o. M) Primary Care Provider: [name] Other Clinician: Referring Provider: Treating Provider/Extender: [name] in Treatment: 3 Subjective Chief Complaint Information obtained from Patient 01/22/2023; left plantar diabetic foot ulcer History of Present Illness (HPI) The following HPI elements were documented for the patient's wound: Location: left foot was the initial wound and then he has one on the right foot Quality: Patient reports No Pain. Severity: Patient states wound(s) are getting worse. Duration: Patient has had the wound for > 10 months prior to seeking treatment at the wound center Context: The wound would happen gradually Modifying Factors: Patient wound(s)/ulcer(s) are worsening due to : nonhealing nature in spite of various medications and procedures by the podiatrist Dr. Earleen Newport 01/22/2023 Mr. Jaquavian Wencl is a 72 year old male With a past medical history of insulin-dependent type 2 diabetes with last hemoglobin 123456 of 7.9 complicated by peripheral neuropathy, essential tremors, and OSA that presents the clinic for a 3-74-month history of nonhealing ulcer to the plantar aspect of his left foot. He is not sure how it started but over time has progressively become larger. He is not using any offloading device. He has been placing a Band-Aid with antibiotic ointment to the wound bed. He has been following with Dr. Earleen Newport for this issue, podiatry. He last had an x-ray on 12/05/2022 that showed  no definitive cortical destruction suggesting osteomyelitis. He currently denies systemic signs of infection. 3/7; patient presents for follow-up. He has been using Hydrofera Blue to the wound bed. He has been using his surgical shoe with peg assist for offloading. He has been taking his oral antibiotics. He has no issues or complaints today. 3/14; patient presents for follow-up. He has been using Hydrofera Blue with Medihoney to the wound bed. He has been using his surgical shoe with peg assist for offloading. He has no issues or complaints today. 3/19; patient presents for follow-up. He has been using Hydrofera Blue and Medihoney to the wound bed. Plan is for the total contact cast today. He has no issues or complaints today. 3/21; patient presents for obligatory cast change. We have been using Hydrofera Blue  and antibiotic ointment under the total contact cast. He tolerated the cast well and has no issues or complaints today. 3/26; patient presents for follow-up. We have been using Hydrofera Blue and antibiotic ointment under the total contact cast. There has been improvement in wound healing. Patient History Information obtained from Patient. Family History Diabetes - Maternal Grandparents. Social History Former smoker, Alcohol Use - Moderate - 2 drinks weekly, Drug Use - No History, Caffeine Use - Daily. IMOTHY, BLAND A ([id number]) [id number] Page 5 of 9 Medical History Eyes Patient has history of Cataracts - extraction bilat Denies history of Glaucoma, Optic Neuritis Ear/Nose/Mouth/Throat Denies history of Chronic sinus problems/congestion, Middle ear problems Hematologic/Lymphatic Denies history of Anemia, Hemophilia, Human Immunodeficiency Virus, Lymphedema, Sickle Cell Disease Respiratory Denies history of Aspiration, Asthma, Chronic Obstructive Pulmonary Disease (COPD), Pneumothorax, Sleep Apnea, Tuberculosis Cardiovascular Patient has history of  Hypertension Denies history of Angina, Arrhythmia, Congestive Heart Failure, Coronary Artery Disease, Deep Vein Thrombosis, Hypotension, Myocardial Infarction, Peripheral Arterial Disease, Peripheral Venous Disease, Phlebitis, Vasculitis Gastrointestinal Denies history of Cirrhosis , Colitis, Crohnoos, Hepatitis A, Hepatitis B, Hepatitis C Endocrine Patient has history of Type II Diabetes Denies history of Type I Diabetes Genitourinary Denies history of End Stage Renal Disease Immunological Denies history of Lupus Erythematosus, Raynaudoos, Scleroderma Integumentary (Skin) Denies history of History of Burn Musculoskeletal Denies history of Gout, Rheumatoid Arthritis, Osteoarthritis, Osteomyelitis Neurologic Patient has history of Neuropathy Denies history of Dementia, Quadriplegia, Paraplegia, Seizure Disorder Oncologic Denies history of Received Chemotherapy, Received Radiation Psychiatric Denies history of Anorexia/bulimia, Confinement Anxiety Hospitalization/Surgery History - appendectomy. - tonsillectomy. - vasectomy. - nasal septum surgery. Medical A Surgical History Notes nd Constitutional Symptoms (General Health) arthro knee surgery Cardiovascular h/o CVA , Neurologic central tremors Oncologic h/o melanoma Objective Constitutional respirations regular, non-labored and within target range for patient.. Vitals Time Taken: 8:57 AM, Temperature: 97.9 F, Pulse: 76 bpm, Respiratory Rate: 17 breaths/min, Blood Pressure: 157/81 mmHg. Cardiovascular 2+ dorsalis pedis/posterior tibialis pulses. Psychiatric pleasant and cooperative. General Notes: Left foot: T the plantar/medial aspect there is an open wound with granulation tissue, nonviable tissue and callus. No increased warmth, o erythema or purulent drainage. Integumentary (Hair, Skin) Wound #4 status is Open. Original cause of wound was Gradually Appeared. The date acquired was: 09/24/2022. The wound has been in  treatment 3 weeks. The wound is located on the Left Foot. The wound measures 0.3cm length x 0.3cm width x 0.1cm depth; 0.071cm^2 area and 0.007cm^3 volume. There is Fat Layer (Subcutaneous Tissue) exposed. There is no tunneling or undermining noted. There is a medium amount of serosanguineous drainage noted. Foul odor after cleansing was noted. The wound margin is distinct with the outline attached to the wound base. There is large (67-100%) red, pink granulation within the wound bed. There is no necrotic tissue within the wound bed. The periwound skin appearance exhibited: Callus. The periwound skin appearance did not exhibit: Crepitus, Excoriation, Induration, Rash, Scarring, Dry/Scaly, Maceration, Atrophie Blanche, Cyanosis, Ecchymosis, Hemosiderin Staining, Mottled, Pallor, Rubor, Erythema. Periwound temperature was noted as No Abnormality. The periwound has tenderness on palpation. Assessment NORTHERN, CORLETO A ([id number]) [id number] Page 6 of 9 Active Problems ICD-10 Non-pressure chronic ulcer of other part of left foot with fat layer exposed Type 2 diabetes mellitus with foot ulcer Essential tremor Patient's wound has shown improvement in size and appearance since last clinic visit. I debrided nonviable tissue. I recommended continuing the course with antibiotic ointment and Hydrofera Blue under the total contact cast. Follow-up in 1 week. Procedures Wound #  4 Pre-procedure diagnosis of Wound #4 is a Diabetic Wound/Ulcer of the Lower Extremity located on the Left Foot .Severity of Tissue Pre Debridement is: Fat layer exposed. There was a Excisional Skin/Subcutaneous Tissue Debridement with a total area of 0.09 sq cm performed by Kalman Shan, DO. With the following instrument(s): Curette to remove Viable and Non-Viable tissue/material. Material removed includes Callus, Subcutaneous Tissue, and Slough after achieving pain control using Lidocaine. No specimens  were taken. A time out was conducted at 09:32, prior to the start of the procedure. A Minimum amount of bleeding was controlled with Pressure. The procedure was tolerated well with a pain level of 0 throughout and a pain level of 0 following the procedure. Post Debridement Measurements: 0.3cm length x 0.3cm width x 0.1cm depth; 0.007cm^3 volume. Character of Wound/Ulcer Post Debridement is improved. Severity of Tissue Post Debridement is: Fat layer exposed. Post procedure Diagnosis Wound #4: Same as Pre-Procedure Pre-procedure diagnosis of Wound #4 is a Diabetic Wound/Ulcer of the Lower Extremity located on the Left Foot . There was a T Programmer, multimedia Procedure otal by Kalman Shan, DO. Post procedure Diagnosis Wound #4: Same as Pre-Procedure Plan Follow-up Appointments: Return Appointment in 1 week. - w/ Dr. Celine Ahr and Bobbi Rm # 4 on Tuesday 02/24/23 @ 11:00 Return Appointment in 2 weeks. - w/ Dr. Heber Waverly and Allayne Butcher Rm # 9 Tuesday 03/03/23 @ 9:30 Anesthetic: (In clinic) Topical Lidocaine 5% applied to wound bed Bathing/ Shower/ Hygiene: May shower with protection but do not get wound dressing(s) wet. Protect dressing(s) with water repellant cover (for example, large plastic bag) or a cast cover and may then take shower. Edema Control - Lymphedema / SCD / Other: Elevate legs to the level of the heart or above for 30 minutes daily and/or when sitting for 3-4 times a day throughout the day. Avoid standing for long periods of time. Off-Loading: T Contact Cast to Left Lower Extremity otal WOUND #4: - Foot Wound Laterality: Left Cleanser: Vashe 5.8 (oz) (Generic) 1 x Per Week/15 Days Discharge Instructions: Cleanse the wound with Vashe prior to applying a clean dressing using gauze sponges, not tissue or cotton balls. Topical: Gentamicin 1 x Per Week/15 Days Discharge Instructions: As directed by physician Prim Dressing: Hydrofera Blue Ready Transfer Foam, 4x5 (in/in) (Generic) 1 x Per  Week/15 Days ary Discharge Instructions: Apply to wound bed as instructed Secondary Dressing: Optifoam Non-Adhesive Dressing, 4x4 in (Generic) 1 x Per Week/15 Days Discharge Instructions: Apply foam donut over primary dressing as directed. Secondary Dressing: Woven Gauze Sponge, Non-Sterile 4x4 in (Generic) 1 x Per Week/15 Days Discharge Instructions: Apply over primary dressing as directed. Secured With: 45M Medipore H Soft Cloth Surgical T ape, 4 x 10 (in/yd) (Generic) 1 x Per Week/15 Days Discharge Instructions: Secure with tape as directed. 1. In office sharp debridement 2. T contact cast placed in standard fashion otal 3. Hydrofera Blue and antibiotic ointment 4. Follow-up in 1 week Electronic Signature(s) Signed: 02/17/2023 10:32:58 AM By: Kalman Shan DO Entered By: Kalman Shan on 02/17/2023 09:53:44 -------------------------------------------------------------------------------- HxROS Details Patient Name: Date of Service: Joshua Mane A. 02/17/2023 9:00 A M Medical Record Number: [id number] Patient Account Number: [id number] DEXTER, WILBOURNE ([id number]) [phone].pdf Page 7 of 9 Date of Birth/Sex: Treating RN: [birth year] (72 y.o. M) Primary Care Provider: Other Clinician: [name] Referring Provider: Treating Provider/Extender: [name] in Treatment: 3 Information Obtained From Patient Constitutional Symptoms (General Health) Medical History: Past Medical History Notes: arthro knee  surgery Eyes Medical History: Positive for: Cataracts - extraction bilat Negative for: Glaucoma; Optic Neuritis Ear/Nose/Mouth/Throat Medical History: Negative for: Chronic sinus problems/congestion; Middle ear problems Hematologic/Lymphatic Medical History: Negative for: Anemia; Hemophilia; Human Immunodeficiency Virus; Lymphedema; Sickle Cell Disease Respiratory Medical History: Negative for: Aspiration; Asthma;  Chronic Obstructive Pulmonary Disease (COPD); Pneumothorax; Sleep Apnea; Tuberculosis Cardiovascular Medical History: Positive for: Hypertension Negative for: Angina; Arrhythmia; Congestive Heart Failure; Coronary Artery Disease; Deep Vein Thrombosis; Hypotension; Myocardial Infarction; Peripheral Arterial Disease; Peripheral Venous Disease; Phlebitis; Vasculitis Past Medical History Notes: h/o CVA , Gastrointestinal Medical History: Negative for: Cirrhosis ; Colitis; Crohns; Hepatitis A; Hepatitis B; Hepatitis C Endocrine Medical History: Positive for: Type II Diabetes Negative for: Type I Diabetes Time with diabetes: since 2010 Treated with: Insulin Blood sugar tested every day: Yes Tested : daily Blood sugar testing results: Breakfast: 142 Genitourinary Medical History: Negative for: End Stage Renal Disease Immunological Medical History: Negative for: Lupus Erythematosus; Raynauds; Scleroderma Integumentary (Skin) Medical History: Negative for: History of Burn Musculoskeletal Medical History: Negative for: Gout; Rheumatoid Arthritis; Osteoarthritis; Osteomyelitis ABEN, MANSOOR A ([id number].pdf Page 8 of 9 Neurologic Medical History: Positive for: Neuropathy Negative for: Dementia; Quadriplegia; Paraplegia; Seizure Disorder Past Medical History Notes: central tremors Oncologic Medical History: Negative for: Received Chemotherapy; Received Radiation Past Medical History Notes: h/o melanoma Psychiatric Medical History: Negative for: Anorexia/bulimia; Confinement Anxiety HBO Extended History Items Eyes: Cataracts Immunizations Pneumococcal Vaccine: Received Pneumococcal Vaccination: No Immunization Notes: unknown Implantable Devices No devices added Hospitalization / Surgery History Type of Hospitalization/Surgery appendectomy tonsillectomy vasectomy nasal septum surgery Family and Social History Diabetes: Yes -  Maternal Grandparents; Former smoker; Alcohol Use: Moderate - 2 drinks weekly; Drug Use: No History; Caffeine Use: Daily; Financial Concerns: No; Food, Clothing or Shelter Needs: No; Support System Lacking: No; Transportation Concerns: No Electronic Signature(s) Signed: 02/17/2023 10:32:58 AM By: Kalman Shan DO Entered By: Kalman Shan on 02/17/2023 09:52:25 -------------------------------------------------------------------------------- Total Contact Cast Details Patient Name: Date of Service: Joshua Mane A. 02/17/2023 9:00 A M Medical Record Number: [id number] Patient Account Number: [id number] Date of Birth/Sex: Treating RN: 1950/12/25 (72 y.o. [name], [name] Primary Care Provider: [name] Other Clinician: Referring Provider: Treating Provider/Extender: [name] in Treatment: 3 T Contact Cast Applied for Wound Assessment: otal Wound #4 Left Foot Performed By: Physician Kalman Shan, DO Post Procedure Diagnosis Same as Pre-procedure Electronic Signature(s) Signed: 02/17/2023 10:32:58 AM By: Kalman Shan DO Signed: 02/25/2023 4:26:45 PM By: Rhae Hammock RN Entered By: Rhae Hammock on 02/17/2023 09:31:55 [name] ([id number].pdf Page 9 of 9 -------------------------------------------------------------------------------- SuperBill Details Patient Name: Date of Service: Joshua [name] 02/17/2023 Medical Record Number: [id number] Patient Account Number: [id number] Date of Birth/Sex: Treating RN: 1951/03/06 (72 y.o. [name], [name] Primary Care Provider: [name] Other Clinician: Referring Provider: Treating Provider/Extender: [name] in Treatment: 3 Diagnosis Coding ICD-10 Codes Code Description [phone] Non-pressure chronic ulcer of other part of left foot with fat layer exposed E11.621 Type 2 diabetes mellitus with foot ulcer G25.0  Essential tremor Facility Procedures : CPT4 Code: [id number] Description: 11042 - DEB SUBQ TISSUE 20 SQ CM/< ICD-10 Diagnosis Description L97.522 Non-pressure chronic ulcer of other part of left foot with fat layer exposed E11.621 Type 2 diabetes mellitus with foot ulcer Modifier: Quantity: 1 Physician Procedures : CPT4 Code Description Modifier [id number] - WC PHYS SUBQ TISS 20 SQ CM ICD-10 Diagnosis Description L97.522 Non-pressure chronic ulcer of other part of left foot  with fat layer exposed E11.621 Type 2 diabetes mellitus with foot ulcer Quantity: 1 Electronic Signature(s) Signed: 02/17/2023 10:32:58 AM By: Kalman Shan DO Entered By: Kalman Shan on 02/17/2023 09:53:53

## 2023-02-25 NOTE — Progress Notes (Signed)
LIM, JANET Joshua [name] ([id number]) [id number] Page 1 of 7 Visit Report for 02/17/2023 Arrival Information Details Patient Name: Date of Service: GO Joshua Joshua [name] 02/17/2023 9:00 Joshua [name] Medical Record Number: [id number] Patient Account Number: [id number] Date of Birth/Sex: Treating RN: 04-Jun-1951 (72 y.o. Joshua Joshua [name], Joshua [name] Primary Care Joshua Joshua [name]: Joshua Joshua [name] Other Clinician: Referring Joshua Joshua [name]: Treating Joshua Joshua [name]/Extender: Joshua Joshua [name] in Treatment: 3 Visit Information History Since Last Visit Added or deleted any medications: No Patient Arrived: Joshua Joshua [name] Any new allergies or adverse reactions: No Arrival Time: 08:57 Had Joshua [name] fall or experienced change in No Accompanied By: self activities of daily living that may affect Transfer Assistance: Manual risk of falls: Patient Identification Verified: Yes Signs or symptoms of abuse/neglect since last visito No Secondary Verification Process Completed: Yes Hospitalized since last visit: No Patient Requires Transmission-Based Precautions: No Implantable device outside of the clinic excluding No Patient Has Alerts: Yes cellular tissue based products placed in the center since last visit: Has Dressing in Place as Prescribed: Yes Has Footwear/Offloading in Place as Prescribed: Yes Left: T Contact Cast otal Pain Present Now: No Electronic Signature(s) Signed: 02/25/2023 4:26:45 PM By: Rhae Hammock RN Entered By: Rhae Hammock on 02/17/2023 08:57:40 -------------------------------------------------------------------------------- Encounter Discharge Information Details Patient Name: Date of Service: Joshua Joshua [name]. 02/17/2023 9:00 Joshua [name] Medical Record Number: [id number] Patient Account Number: [id number] Date of Birth/Sex: Treating RN: [birth year] (72 y.o. Joshua Joshua [name], Joshua [name] Primary Care Joshua Joshua [name]: Joshua Joshua [name] Other Clinician: Referring Joshua Joshua [name]: Treating Joshua Joshua [name]/Extender: Joshua Joshua [name] in Treatment: 3 Encounter Discharge Information Items Post Procedure Vitals Discharge Condition: Stable Temperature (F): 98.7 Ambulatory Status: Walker Pulse (bpm): 74 Discharge Destination: Home Respiratory Rate (breaths/min): 17 Transportation: Private Auto Blood Pressure (mmHg): 134/74 Accompanied By: self Schedule Follow-up Appointment: Yes Clinical Summary of Care: Patient Declined Electronic Signature(s) Signed: 02/25/2023 4:26:45 PM By: Rhae Hammock RN Entered By: Rhae Hammock on 02/17/2023 09:33:41 -------------------------------------------------------------------------------- Lower Extremity Assessment Details Patient Name: Date of Service: Joshua Joshua [name]. 02/17/2023 9:00 Joshua [name] Medical Record Number: [id number] Patient Account Number: [id number] Date of Birth/Sex: Treating RN: 24-Mar-1951 (72 y.o. Joshua Joshua [name], Joshua [name] Primary Care Lezley Bedgood: Joshua Joshua [name] Other Clinician: Referring Joshua Joshua [name]: Treating Joshua Joshua [name]/Extender: Joshua Joshua [name] in Treatment: 3 Edema Assessment Left: [Left: Right] Joshua Joshua [name]: :] Assessed: [Left: Yes] [Right: No] Edema: [Left: N] [Right: o] Calf Left: Right: Point of Measurement: 40 cm From Medial Instep 40.5 cm Ankle Left: Right: Point of Measurement: 11 cm From Medial Instep 26.5 cm Vascular Assessment Pulses: Dorsalis Pedis Palpable: [Left:Yes] Posterior Tibial Palpable: [Left:Yes] Electronic Signature(s) Signed: 02/25/2023 4:26:45 PM By: Rhae Hammock RN Entered By: Rhae Hammock on 02/17/2023 08:58:30 -------------------------------------------------------------------------------- Multi Wound Chart Details Patient Name: Date of Service: Joshua Joshua [name]. 02/17/2023 9:00 Joshua [name] Medical Record Number: [id number] Patient Account Number: [id number] Date of Birth/Sex: Treating RN: June 06, 1951 (72 y.o. M) Primary Care Joshua Joshua [name]: Joshua Joshua [name] Other Clinician: Referring  Keonda Dow: Treating Joshua Joshua [name]/Extender: Joshua Joshua [name] in Treatment: 3 Vital Signs Height(in): Pulse(bpm): 42 Weight(lbs): Blood Pressure(mmHg): 157/81 Body Mass Index(BMI): Temperature(F): 97.9 Respiratory Rate(breaths/min): 17 [4:Photos:] [N/Joshua [name]:N/Joshua [name]] Left Foot N/Joshua [name] N/Joshua [name] Wound Location: Gradually Appeared N/Joshua [name] N/Joshua [name] Wounding Event: Diabetic Wound/Ulcer of the Lower N/Joshua [name] N/Joshua [name] Primary Etiology: Extremity Cataracts, Hypertension, Type II N/Joshua [name] N/Joshua [name] Comorbid History: Diabetes, Neuropathy 09/24/2022 N/Joshua [name] N/Joshua [name] Date Acquired: 3 N/Joshua [name] N/Joshua [name] Weeks of Treatment: Open N/Joshua [name] N/Joshua [name] Wound Status: No N/Joshua [name] N/Joshua [name] Wound Recurrence: Yes N/Joshua [name] N/Joshua [name] Pending Joshua [name] mputation on Presentation: 0.3x0.3x0.1 N/Joshua [name] N/Joshua [name]  Measurements L x W x D (cm) 0.071 N/Joshua [name] N/Joshua [name] Joshua [name] (cm) : rea 0.007 N/Joshua [name] N/Joshua [name] Volume (cm) : 63.80% N/Joshua [name] N/Joshua [name] % Reduction in Joshua [name] rea: 94.90% N/Joshua [name] N/Joshua [name] % Reduction in Volume: Grade 1 N/Joshua [name] N/Joshua [name] Classification: Medium N/Joshua [name] N/Joshua [name] Exudate Joshua [name] mount: Serosanguineous N/Joshua [name] N/Joshua [name] Exudate Type: red, brown N/Joshua [name] N/Joshua [name] Exudate Color: Joshua Joshua [name], Joshua Joshua [name] ([id number] Page 3 of 7 Yes N/Joshua [name] N/Joshua [name] Foul Odor Joshua [name] Cleansing: fter No N/Joshua [name] N/Joshua [name] Odor Anticipated Due to Product Use: Distinct, outline attached N/Joshua [name] N/Joshua [name] Wound Margin: Large (67-100%) N/Joshua [name] N/Joshua [name] Granulation Joshua [name] mount: Red, Pink N/Joshua [name] N/Joshua [name] Granulation Quality: None Present (0%) N/Joshua [name] N/Joshua [name] Necrotic Amount: Fat Layer (Subcutaneous Tissue): Yes N/Joshua [name] N/Joshua [name] Exposed Structures: Fascia: No Tendon: No Muscle: No Joint: No Bone: No Large (67-100%) N/Joshua [name] N/Joshua [name] Epithelialization: Debridement - Excisional N/Joshua [name] N/Joshua [name] Debridement: Pre-procedure Verification/Time Out 09:32 N/Joshua [name] N/Joshua [name] Taken: Lidocaine N/Joshua [name] N/Joshua [name] Pain Control: Callus, Subcutaneous, Slough N/Joshua [name] N/Joshua [name] Tissue Debrided: Skin/Subcutaneous Tissue N/Joshua [name] N/Joshua [name] Level: 0.09 N/Joshua [name] N/Joshua [name] Debridement Joshua [name] (sq cm): rea Curette N/Joshua [name] N/Joshua [name] Instrument: Minimum N/Joshua [name] N/Joshua [name] Bleeding: Pressure N/Joshua [name] N/Joshua [name] Hemostasis Joshua [name] chieved: 0 N/Joshua [name]  N/Joshua [name] Procedural Pain: 0 N/Joshua [name] N/Joshua [name] Post Procedural Pain: Procedure was tolerated well N/Joshua [name] N/Joshua [name] Debridement Treatment Response: 0.3x0.3x0.1 N/Joshua [name] N/Joshua [name] Post Debridement Measurements L x W x D (cm) 0.007 N/Joshua [name] N/Joshua [name] Post Debridement Volume: (cm) Callus: Yes N/Joshua [name] N/Joshua [name] Periwound Skin Texture: Excoriation: No Induration: No Crepitus: No Rash: No Scarring: No Maceration: No N/Joshua [name] N/Joshua [name] Periwound Skin Moisture: Dry/Scaly: No Atrophie Blanche: No N/Joshua [name] N/Joshua [name] Periwound Skin Color: Cyanosis: No Ecchymosis: No Erythema: No Hemosiderin Staining: No Mottled: No Pallor: No Rubor: No No Abnormality N/Joshua [name] N/Joshua [name] Temperature: Yes N/Joshua [name] N/Joshua [name] Tenderness on Palpation: Debridement N/Joshua [name] N/Joshua [name] Procedures Performed: T Contact Cast otal Treatment Notes Wound #4 (Foot) Wound Laterality: Left Cleanser Vashe 5.8 (oz) Discharge Instruction: Cleanse the wound with Vashe prior to applying Joshua [name] clean dressing using gauze sponges, not tissue or cotton balls. Peri-Wound Care Topical Gentamicin Discharge Instruction: As directed by physician Primary Dressing Hydrofera Blue Ready Transfer Foam, 4x5 (in/in) Discharge Instruction: Apply to wound bed as instructed Secondary Dressing Optifoam Non-Adhesive Dressing, 4x4 in Discharge Instruction: Apply foam donut over primary dressing as directed. Woven Gauze Sponge, Non-Sterile 4x4 in Discharge Instruction: Apply over primary dressing as directed. Secured With 51M Medipore H Soft Cloth Surgical T ape, 4 x 10 (in/yd) Discharge Instruction: Secure with tape as directed. Compression Wrap Compression Stockings Joshua Joshua [name], Joshua Joshua [name] ([id number]) [id number] Page 4 of 7 Add-Ons Electronic Signature(s) Signed: 02/17/2023 10:32:58 AM By: Kalman Shan DO Entered By: Kalman Shan on 02/17/2023 09:51:38 -------------------------------------------------------------------------------- Multi-Disciplinary Care Plan Details Patient Name: Date of Service: GO Joshua Cheng Joshua [name]. 02/17/2023 9:00 Joshua [name] Medical Record Number: [id number] Patient Account Number: [id number] Date of Birth/Sex: Treating RN: 12/23/1950 (72 y.o. Joshua Joshua [name], Joshua [name] Primary Care Jia Dottavio: Joshua Joshua [name] Other Clinician: Referring Aniyia Rane: Treating Malvern Kadlec/Extender: Joshua Joshua [name] in Treatment: 3 Active Inactive Wound/Skin Impairment Nursing Diagnoses: Impaired tissue integrity Knowledge deficit related to ulceration/compromised skin integrity Goals: Patient will have Joshua [name] decrease in wound volume by X% from date: (specify in notes) Date Initiated: 01/22/2023 Target Resolution Date: 02/21/2023 Goal Status: Active Patient/caregiver will verbalize understanding of skin care regimen Date Initiated: 01/22/2023 Target Resolution Date: 02/21/2023 Goal Status: Active Ulcer/skin breakdown will have Joshua [name] volume reduction of 30% by week 4 Date Initiated: 01/22/2023 Target Resolution Date: 02/21/2023 Goal Status: Active Interventions: Assess patient/caregiver ability to obtain necessary supplies Assess patient/caregiver ability to perform ulcer/skin care regimen upon admission and as needed  Assess ulceration(s) every visit Notes: Electronic Signature(s) Signed: 02/25/2023 4:26:45 PM By: Rhae Hammock RN Entered By: Rhae Hammock on 02/17/2023 09:29:04 -------------------------------------------------------------------------------- Pain Assessment Details Patient Name: Date of Service: Joshua Joshua [name]. 02/17/2023 9:00 Joshua [name] Medical Record Number: [id number] Patient Account Number: [id number] Date of Birth/Sex: Treating RN: [birth year] (72 y.o. Joshua Joshua [name], Joshua [name] Primary Care Ralston Venus: Joshua Joshua [name] Other Clinician: Referring Jaelee Laughter: Treating Hector Taft/Extender: Joshua Joshua [name] in Treatment: 3 Active Problems Location of Pain Severity and Description of Pain Patient Has Paino No Site Locations Joshua Joshua [name], Joshua Joshua [name] ([id number])  [id number] Page 5 of 7 Pain Management and Medication Current Pain Management: Electronic Signature(s) Signed: 02/25/2023 4:26:45 PM By: Rhae Hammock RN Entered By: Rhae Hammock on 02/17/2023 08:58:18 -------------------------------------------------------------------------------- Patient/Caregiver Education Details Patient Name: Date of Service: GO Joshua Joshua [name] 3/26/2024andnbsp9:00 Joshua [name] Medical Record Number: [id number] Patient Account Number: [id number] Date of Birth/Gender: Treating RN: 09-05-1951 (72 y.o. Erie Noe Primary Care Physician: Joshua Joshua [name] Other Clinician: Referring Physician: Treating Physician/Extender: Joshua Joshua [name] in Treatment: 3 Education Assessment Education Provided To: Patient Education Topics Provided Wound/Skin Impairment: Methods: Explain/Verbal Responses: Reinforcements needed, State content correctly Electronic Signature(s) Signed: 02/25/2023 4:26:45 PM By: Rhae Hammock RN Entered By: Rhae Hammock on 02/17/2023 09:28:52 -------------------------------------------------------------------------------- Wound Assessment Details Patient Name: Date of Service: Joshua Joshua [name]. 02/17/2023 9:00 Joshua [name] Medical Record Number: [id number] Patient Account Number: [id number] Date of Birth/Sex: Treating RN: 1951-02-16 (72 y.o. Joshua Joshua [name], Joshua [name] Primary Care Yasuo Phimmasone: Joshua Joshua [name] Other Clinician: Referring Keaundre Thelin: Treating Evetta Renner/Extender: Joshua Joshua [name] in Treatment: 3 Wound Status Wound Number: 4 Primary Etiology: Diabetic Wound/Ulcer of the Lower Extremity Wound Location: Left Foot Wound Status: Open Wounding Event: Gradually Appeared Comorbid History: Cataracts, Hypertension, Type II Diabetes, Neuropathy Date Acquired: 09/24/2022 Weeks Of Treatment: 3 Clustered Wound: No Joshua Joshua [name], Joshua Joshua [name] ([id number] Page 6 of  7 Pending Amputation On Presentation Photos Wound Measurements Length: (cm) 0.3 Width: (cm) 0.3 Depth: (cm) 0.1 Area: (cm) 0.071 Volume: (cm) 0.007 % Reduction in Area: 63.8% % Reduction in Volume: 94.9% Epithelialization: Large (67-100%) Tunneling: No Undermining: No Wound Description Classification: Grade 1 Wound Margin: Distinct, outline attached Exudate Amount: Medium Exudate Type: Serosanguineous Exudate Color: red, brown Foul Odor After Cleansing: Yes Due to Product Use: No Slough/Fibrino Yes Wound Bed Granulation Amount: Large (67-100%) Exposed Structure Granulation Quality: Red, Pink Fascia Exposed: No Necrotic Amount: None Present (0%) Fat Layer (Subcutaneous Tissue) Exposed: Yes Tendon Exposed: No Muscle Exposed: No Joint Exposed: No Bone Exposed: No Periwound Skin Texture Texture Color No Abnormalities Noted: No No Abnormalities Noted: No Callus: Yes Atrophie Blanche: No Crepitus: No Cyanosis: No Excoriation: No Ecchymosis: No Induration: No Erythema: No Rash: No Hemosiderin Staining: No Scarring: No Mottled: No Pallor: No Moisture Rubor: No No Abnormalities Noted: No Dry / Scaly: No Temperature / Pain Maceration: No Temperature: No Abnormality Tenderness on Palpation: Yes Electronic Signature(s) Signed: 02/25/2023 4:26:45 PM By: Rhae Hammock RN Entered By: Rhae Hammock on 02/17/2023 09:13:48 -------------------------------------------------------------------------------- Vitals Details Patient Name: Date of Service: Joshua Joshua [name]. 02/17/2023 9:00 Joshua [name] Medical Record Number: [id number] Patient Account Number: [id number] Date of Birth/Sex: Treating RN: [birth year] (72 y.o. Erie Noe Primary Care Jaynell Castagnola: Joshua Joshua [name] Other Clinician: Referring Yittel Emrich: Treating Rilynne Lonsway/Extender: Joshua Joshua [name] in Treatment: 3 Vital Signs Time Taken: 08:57 Temperature (F): 97.9 Joshua Joshua [name], Joshua Joshua [name]  ([id number]) [id number] Page 7 of 7 Pulse (bpm): 76 Respiratory Rate (breaths/min):  17 Blood Pressure (mmHg): 157/81 Reference Range: 80 - 120 mg / dl Electronic Signature(s) Signed: 02/25/2023 4:26:45 PM By: Rhae Hammock RN Entered By: Rhae Hammock on 02/17/2023 08:58:07

## 2023-03-03 ENCOUNTER — Encounter (HOSPITAL_BASED_OUTPATIENT_CLINIC_OR_DEPARTMENT_OTHER): Payer: Medicare PPO | Admitting: Internal Medicine

## 2023-03-04 DIAGNOSIS — E1121 Type 2 diabetes mellitus with diabetic nephropathy: Secondary | ICD-10-CM | POA: Diagnosis not present

## 2023-03-05 DIAGNOSIS — L570 Actinic keratosis: Secondary | ICD-10-CM | POA: Diagnosis not present

## 2023-03-05 DIAGNOSIS — D2261 Melanocytic nevi of right upper limb, including shoulder: Secondary | ICD-10-CM | POA: Diagnosis not present

## 2023-03-05 DIAGNOSIS — D225 Melanocytic nevi of trunk: Secondary | ICD-10-CM | POA: Diagnosis not present

## 2023-03-05 DIAGNOSIS — D2262 Melanocytic nevi of left upper limb, including shoulder: Secondary | ICD-10-CM | POA: Diagnosis not present

## 2023-03-05 DIAGNOSIS — L821 Other seborrheic keratosis: Secondary | ICD-10-CM | POA: Diagnosis not present

## 2023-03-06 ENCOUNTER — Encounter (HOSPITAL_BASED_OUTPATIENT_CLINIC_OR_DEPARTMENT_OTHER): Payer: Medicare PPO | Admitting: Internal Medicine

## 2023-03-06 DIAGNOSIS — Z794 Long term (current) use of insulin: Secondary | ICD-10-CM | POA: Diagnosis not present

## 2023-03-06 DIAGNOSIS — G4733 Obstructive sleep apnea (adult) (pediatric): Secondary | ICD-10-CM | POA: Diagnosis not present

## 2023-03-06 DIAGNOSIS — Z87891 Personal history of nicotine dependence: Secondary | ICD-10-CM | POA: Diagnosis not present

## 2023-03-06 DIAGNOSIS — G250 Essential tremor: Secondary | ICD-10-CM | POA: Diagnosis not present

## 2023-03-06 DIAGNOSIS — E11621 Type 2 diabetes mellitus with foot ulcer: Secondary | ICD-10-CM

## 2023-03-06 DIAGNOSIS — L97522 Non-pressure chronic ulcer of other part of left foot with fat layer exposed: Secondary | ICD-10-CM | POA: Diagnosis not present

## 2023-03-06 DIAGNOSIS — E1151 Type 2 diabetes mellitus with diabetic peripheral angiopathy without gangrene: Secondary | ICD-10-CM | POA: Diagnosis not present

## 2023-03-06 NOTE — Progress Notes (Signed)
JYHEIM, ARNTZEN A ([id number]) [id number] Page 1 of 6 Visit Report for 03/06/2023 Arrival Information Details Patient Name: Date of Service: Joshua [name] 03/06/2023 8:45 A M Medical Record Number: [id number] Patient Account Number: [id number] Date of Birth/Sex: Treating RN: 1951-08-25 (72 y.o. Tammy Sours Primary Care Jailyn Langhorst: Creola Corn Other Clinician: Referring Kalese Ensz: Treating Idaliz Tinkle/Extender: Octavia Bruckner in Treatment: 6 Visit Information History Since Last Visit Added or deleted any medications: No Patient Arrived: Joshua [name] Any new allergies or adverse reactions: No Arrival Time: 08:15 Had a fall or experienced change in No Accompanied By: Self activities of daily living that may affect Transfer Assistance: None risk of falls: Patient Identification Verified: Yes Signs or symptoms of abuse/neglect since last visito No Secondary Verification Process Completed: Yes Hospitalized since last visit: No Patient Requires Transmission-Based Precautions: No Implantable device outside of the clinic excluding No Patient Has Alerts: Yes cellular tissue based products placed in the center since last visit: Has Dressing in Place as Prescribed: Yes Has Footwear/Offloading in Place as Prescribed: Yes Left: T Contact Cast otal Pain Present Now: No Electronic Signature(s) Signed: 03/06/2023 3:38:47 PM By: Shawn Stall RN, BSN Entered By: Shawn Stall on 03/06/2023 08:30:34 -------------------------------------------------------------------------------- Clinic Level of Care Assessment Details Patient Name: Date of Service: Joshua Joshua Duty A. 03/06/2023 8:45 A M Medical Record Number: [id number] Patient Account Number: [id number] Date of Birth/Sex: Treating RN: 08/30/1951 (72 y.o. Joshua [name], Joshua [name] Primary Care Jayron Maqueda: Creola Corn Other Clinician: Referring Johannah Rozas: Treating Jayleah Garbers/Extender: Octavia Bruckner in Treatment: 6 Clinic Level of Care Assessment Items TOOL 4 Quantity Score X- 1 0 Use when only an EandM is performed on FOLLOW-UP visit ASSESSMENTS - Nursing Assessment / Reassessment X- 1 10 Reassessment of Co-morbidities (includes updates in patient status) X- 1 5 Reassessment of Adherence to Treatment Plan ASSESSMENTS - Wound and Skin A ssessment / Reassessment X - Simple Wound Assessment / Reassessment - one wound 1 5 []  - 0 Complex Wound Assessment / Reassessment - multiple wounds []  - 0 Dermatologic / Skin Assessment (not related to wound area) ASSESSMENTS - Focused Assessment []  - 0 Circumferential Edema Measurements - multi extremities []  - 0 Nutritional Assessment / Counseling / Intervention []  - 0 Lower Extremity Assessment (monofilament, tuning fork, pulses) []  - 0 Peripheral Arterial Disease Assessment (using hand held doppler) ASSESSMENTS - Ostomy and/or Continence Assessment and Care []  - 0 Incontinence Assessment and Management []  - 0 Ostomy Care Assessment and Management (repouching, etc.) DALLAN, LATZ A ([id number]) [id number] Page 2 of 6 PROCESS - Coordination of Care X - Simple Patient / Family Education for ongoing care 1 15 []  - 0 Complex (extensive) Patient / Family Education for ongoing care X- 1 10 Staff obtains Chiropractor, Records, T Results / Process Orders est []  - 0 Staff telephones HHA, Nursing Homes / Clarify orders / etc []  - 0 Routine Transfer to another Facility (non-emergent condition) []  - 0 Routine Hospital Admission (non-emergent condition) []  - 0 New Admissions / Manufacturing engineer / Ordering NPWT Apligraf, etc. , []  - 0 Emergency Hospital Admission (emergent condition) X- 1 10 Simple Discharge Coordination []  - 0 Complex (extensive) Discharge Coordination PROCESS - Special Needs []  - 0 Pediatric / Minor Patient Management []  - 0 Isolation Patient Management []  - 0 Hearing /  Language / Visual special needs []  - 0 Assessment of Community assistance (transportation, D/C planning, etc.) []  - 0 Additional assistance / Altered mentation []  - 0  Support Surface(s) Assessment (bed, cushion, seat, etc.) INTERVENTIONS - Wound Cleansing / Measurement X - Simple Wound Cleansing - one wound 1 5 []  - 0 Complex Wound Cleansing - multiple wounds X- 1 5 Wound Imaging (photographs - any number of wounds) []  - 0 Wound Tracing (instead of photographs) X- 1 5 Simple Wound Measurement - one wound []  - 0 Complex Wound Measurement - multiple wounds INTERVENTIONS - Wound Dressings []  - 0 Small Wound Dressing one or multiple wounds []  - 0 Medium Wound Dressing one or multiple wounds []  - 0 Large Wound Dressing one or multiple wounds []  - 0 Application of Medications - topical []  - 0 Application of Medications - injection INTERVENTIONS - Miscellaneous []  - 0 External ear exam []  - 0 Specimen Collection (cultures, biopsies, blood, body fluids, etc.) []  - 0 Specimen(s) / Culture(s) sent or taken to Lab for analysis []  - 0 Patient Transfer (multiple staff / Nurse, adult / Similar devices) []  - 0 Simple Staple / Suture removal (25 or less) []  - 0 Complex Staple / Suture removal (26 or more) []  - 0 Hypo / Hyperglycemic Management (close monitor of Blood Glucose) []  - 0 Ankle / Brachial Index (ABI) - do not check if billed separately X- 1 5 Vital Signs Has the patient been seen at the hospital within the last three years: Yes Total Score: 75 Level Of Care: New/Established - Level 2 Electronic Signature(s) Signed: 03/06/2023 3:38:47 PM By: Shawn Stall RN, BSN [place],Signed: 03/06/2023 3:38:47 PM By: Shawn Stall RN, BSN Anselmo Pickler ([id number]) [id number] Page 3 of 6 Entered By: Shawn Stall on 03/06/2023 09:02:19 -------------------------------------------------------------------------------- Encounter Discharge Information  Details Patient Name: Date of Service: Joshua [name] 03/06/2023 8:45 A M Medical Record Number: [id number] Patient Account Number: [id number] Date of Birth/Sex: Treating RN: 1951-03-19 (72 y.o. Tammy Sours Primary Care Aran Menning: Creola Corn Other Clinician: Referring Levetta Bognar: Treating Brityn Mastrogiovanni/Extender: Octavia Bruckner in Treatment: 6 Encounter Discharge Information Items Discharge Condition: Stable Ambulatory Status: Walker Discharge Destination: Home Transportation: Private Auto Accompanied By: self Schedule Follow-up Appointment: No Clinical Summary of Care: Electronic Signature(s) Signed: 03/06/2023 3:38:47 PM By: Shawn Stall RN, BSN Entered By: Shawn Stall on 03/06/2023 09:03:00 -------------------------------------------------------------------------------- Multi Wound Chart Details Patient Name: Date of Service: Joshua Duel A. 03/06/2023 8:45 A M Medical Record Number: [id number] Patient Account Number: [id number] Date of Birth/Sex: Treating RN: Apr 20, 1951 (72 y.o. M) Primary Care Infantof Villagomez: Creola Corn Other Clinician: Referring Kennia Vanvorst: Treating Corion Sherrod/Extender: Octavia Bruckner in Treatment: 6 Vital Signs Height(in): Pulse(bpm): 88 Weight(lbs): Blood Pressure(mmHg): 150/83 Body Mass Index(BMI): Temperature(F): 98.6 Respiratory Rate(breaths/min): 20 [4:Photos:] [N/A:N/A] Left Foot N/A N/A Wound Location: Gradually Appeared N/A N/A Wounding Event: Diabetic Wound/Ulcer of the Lower N/A N/A Primary Etiology: Extremity Cataracts, Hypertension, Type II N/A N/A Comorbid History: Diabetes, Neuropathy 09/24/2022 N/A N/A Date Acquired: 6 N/A N/A Weeks of Treatment: Open N/A N/A Wound Status: No N/A N/A Wound Recurrence: Yes N/A N/A Pending A mputation on Presentation: 0x0x0 N/A N/A Measurements L x W x D (cm) 0 N/A N/A A (cm) : rea 0 N/A N/A Volume (cm) : 100.00% N/A N/A % Reduction in A  rea: 100.00% N/A N/A % Reduction in Volume: Grade 1 N/A N/A Classification: Joshua [name], Joshua A ([id number]) [id number] Page 4 of 6 None Present N/A N/A Exudate Amount: Distinct, outline attached N/A N/A Wound Margin: None Present (0%) N/A N/A Granulation Amount: None Present (0%) N/A N/A Necrotic Amount: Fascia: No  N/A N/A Exposed Structures: Fat Layer (Subcutaneous Tissue): No Tendon: No Muscle: No Joint: No Bone: No Large (67-100%) N/A N/A Epithelialization: Callus: Yes N/A N/A Periwound Skin Texture: Excoriation: No Induration: No Crepitus: No Rash: No Scarring: No Maceration: No N/A N/A Periwound Skin Moisture: Dry/Scaly: No Atrophie Blanche: No N/A N/A Periwound Skin Color: Cyanosis: No Ecchymosis: No Erythema: No Hemosiderin Staining: No Mottled: No Pallor: No Rubor: No No Abnormality N/A N/A Temperature: Yes N/A N/A Tenderness on Palpation: Treatment Notes Electronic Signature(s) Signed: 03/06/2023 12:30:48 PM By: Geralyn Corwin DO Entered By: Geralyn Corwin on 03/06/2023 10:30:12 -------------------------------------------------------------------------------- Multi-Disciplinary Care Plan Details Patient Name: Date of Service: Joshua Duel A. 03/06/2023 8:45 A M Medical Record Number: [id number] Patient Account Number: [id number] Date of Birth/Sex: Treating RN: 30-Mar-1951 (72 y.o. Tammy Sours Primary Care Jerilee Space: Creola Corn Other Clinician: Referring Jasdeep Dejarnett: Treating Cruzito Standre/Extender: Octavia Bruckner in Treatment: 6 Active Inactive Electronic Signature(s) Signed: 03/06/2023 3:38:47 PM By: Shawn Stall RN, BSN Entered By: Shawn Stall on 03/06/2023 08:38:32 -------------------------------------------------------------------------------- Pain Assessment Details Patient Name: Date of Service: Joshua Duel A. 03/06/2023 8:45 A M Medical Record Number: [id number] Patient Account  Number: [id number] Date of Birth/Sex: Treating RN: Dec 01, 1950 (72 y.o. Tammy Sours Primary Care Lylia Karn: Creola Corn Other Clinician: Referring Michale Weikel: Treating Niasha Devins/Extender: Octavia Bruckner in Treatment: 6 Active Problems Location of Pain Severity and Description of Pain Patient Has Paino No Site Locations YEHIA, MCBAIN A ([id number]) [id number] Page 5 of 6 Pain Management and Medication Current Pain Management: Electronic Signature(s) Signed: 03/06/2023 3:38:47 PM By: Shawn Stall RN, BSN Entered By: Shawn Stall on 03/06/2023 08:31:08 -------------------------------------------------------------------------------- Wound Assessment Details Patient Name: Date of Service: Joshua Duel A. 03/06/2023 8:45 A M Medical Record Number: [id number] Patient Account Number: [id number] Date of Birth/Sex: Treating RN: [birth year] (73 y.o. Joshua [name], Joshua [name] Primary Care Daphane Odekirk: Creola Corn Other Clinician: Referring Renette Hsu: Treating Troye Hiemstra/Extender: Octavia Bruckner in Treatment: 6 Wound Status Wound Number: 4 Primary Etiology: Diabetic Wound/Ulcer of the Lower Extremity Wound Location: Left Foot Wound Status: Open Wounding Event: Gradually Appeared Comorbid History: Cataracts, Hypertension, Type II Diabetes, Neuropathy Date Acquired: 09/24/2022 Weeks Of Treatment: 6 Clustered Wound: No Pending Amputation On Presentation Photos Wound Measurements Length: (cm) Width: (cm) Depth: (cm) Area: (cm) Volume: (cm) 0 % Reduction in Area: 100% 0 % Reduction in Volume: 100% 0 Epithelialization: Large (67-100%) 0 Tunneling: No 0 Undermining: No Wound Description Classification: Grade 1 Wound Margin: Distinct, outline attached Exudate Amount: None Present SOPHEAP, BOEHLE A ([id number]) Wound Bed Granulation Amount: None Present (0%) Necrotic Amount: None Present (0%) Foul Odor After Cleansing:  No Slough/Fibrino No [id number] Page 6 of 6 Exposed Structure Fascia Exposed: No Fat Layer (Subcutaneous Tissue) Exposed: No Tendon Exposed: No Muscle Exposed: No Joint Exposed: No Bone Exposed: No Periwound Skin Texture Texture Color No Abnormalities Noted: No No Abnormalities Noted: No Callus: Yes Atrophie Blanche: No Crepitus: No Cyanosis: No Excoriation: No Ecchymosis: No Induration: No Erythema: No Rash: No Hemosiderin Staining: No Scarring: No Mottled: No Pallor: No Moisture Rubor: No No Abnormalities Noted: No Dry / Scaly: No Temperature / Pain Maceration: No Temperature: No Abnormality Tenderness on Palpation: Yes Electronic Signature(s) Signed: 03/06/2023 3:38:47 PM By: Shawn Stall RN, BSN Entered By: Shawn Stall on 03/06/2023 08:31:42 -------------------------------------------------------------------------------- Vitals Details Patient Name: Date of Service: Joshua Duel A. 03/06/2023 8:45 A M Medical Record Number: [id number] Patient Account Number: [id number] Date  of Birth/Sex: Treating RN: [birth year] (72 y.o. Tammy Sours Primary Care Audrey Thull: Creola Corn Other Clinician: Referring Lakayla Barrington: Treating Wildon Cuevas/Extender: Octavia Bruckner in Treatment: 6 Vital Signs Time Taken: 08:15 Temperature (F): 98.6 Pulse (bpm): 88 Respiratory Rate (breaths/min): 20 Blood Pressure (mmHg): 150/83 Reference Range: 80 - 120 mg / dl Electronic Signature(s) Signed: 03/06/2023 3:38:47 PM By: Shawn Stall RN, BSN Entered By: Shawn Stall on 03/06/2023 08:31:00

## 2023-03-06 NOTE — Progress Notes (Signed)
[name], Joshua A ([id number]) [id number] Page 1 of 7 Visit Report for 03/06/2023 Chief Complaint Document Details Patient Name: Date of Service: Joshua [name] 03/06/2023 8:45 A M Medical Record Number: [id number] Patient Account Number: [id number] Date of Birth/Sex: Treating RN: 1951-01-06 (72 y.o. M) Primary Care Provider: Creola Corn Other Clinician: Referring Provider: Treating Provider/Extender: Octavia Bruckner in Treatment: 6 Information Obtained from: Patient Chief Complaint 01/22/2023; left plantar diabetic foot ulcer Electronic Signature(s) Signed: 03/06/2023 12:30:48 PM By: Geralyn Corwin DO Entered By: Geralyn Corwin on 03/06/2023 10:30:21 -------------------------------------------------------------------------------- HPI Details Patient Name: Date of Service: Joshua Duel A. 03/06/2023 8:45 A M Medical Record Number: [id number] Patient Account Number: [id number] Date of Birth/Sex: Treating RN: 27-Sep-1951 (72 y.o. M) Primary Care Provider: Creola Corn Other Clinician: Referring Provider: Treating Provider/Extender: Octavia Bruckner in Treatment: 6 History of Present Illness Location: left foot was the initial wound and then he has one on the right foot Quality: Patient reports No Pain. Severity: Patient states wound(s) are getting worse. Duration: Patient has had the wound for > 10 months prior to seeking treatment at the wound center Context: The wound would happen gradually Modifying Factors: Patient wound(s)/ulcer(s) are worsening due to : nonhealing nature in spite of various medications and procedures by the podiatrist Dr. Loreta Ave HPI Description: 01/22/2023 [name] is a 72 year old male With a past medical history of insulin-dependent type 2 diabetes with last hemoglobin A1c of 7.9 complicated by peripheral neuropathy, essential tremors, and OSA that presents the clinic for a  3-41-month history of nonhealing ulcer to the plantar aspect of his left foot. He is not sure how it started but over time has progressively become larger. He is not using any offloading device. He has been placing a Band-Aid with antibiotic ointment to the wound bed. He has been following with Dr. Loreta Ave for this issue, podiatry. He last had an x-ray on 12/05/2022 that showed no definitive cortical destruction suggesting osteomyelitis. He currently denies systemic signs of infection. 3/7; patient presents for follow-up. He has been using Hydrofera Blue to the wound bed. He has been using his surgical shoe with peg assist for offloading. He has been taking his oral antibiotics. He has no issues or complaints today. 3/14; patient presents for follow-up. He has been using Hydrofera Blue with Medihoney to the wound bed. He has been using his surgical shoe with peg assist for offloading. He has no issues or complaints today. 3/19; patient presents for follow-up. He has been using Hydrofera Blue and Medihoney to the wound bed. Plan is for the total contact cast today. He has no issues or complaints today. 3/21; patient presents for obligatory cast change. We have been using Hydrofera Blue and antibiotic ointment under the total contact cast. He tolerated the cast well and has no issues or complaints today. 3/26; patient presents for follow-up. We have been using Hydrofera Blue and antibiotic ointment under the total contact cast. There has been improvement in wound healing. 02/24/2023: The wound is very nearly healed; just a tiny narrow opening remains. 4/12; patient presents for follow-up. We have been using antibiotic ointment and Hydrofera Blue under the total contact cast. His wound is healed. He threw away his surgical shoe and peg assist earlier in the admission. Electronic Signature(s) Signed: 03/06/2023 12:30:48 PM By: Geralyn Corwin DO Entered By: Geralyn Corwin on 03/06/2023 10:31:51 [name] ([id number]) [id number] Page 2 of 7 -------------------------------------------------------------------------------- Physical Exam  Details Patient Name: Date of Service: Joshua [name] 03/06/2023 8:45 A M Medical Record Number: [id number] Patient Account Number: [id number] Date of Birth/Sex: Treating RN: February 24, 1951 (72 y.o. M) Primary Care Provider: Creola Corn Other Clinician: Referring Provider: Treating Provider/Extender: Octavia Bruckner in Treatment: 6 Constitutional respirations regular, non-labored and within target range for patient.. Cardiovascular 2+ dorsalis pedis/posterior tibialis pulses. Psychiatric pleasant and cooperative. Notes Epithelization to the previous wound site on the plantar aspect of the left foot. Electronic Signature(s) Signed: 03/06/2023 12:30:48 PM By: Geralyn Corwin DO Entered By: Geralyn Corwin on 03/06/2023 10:32:46 -------------------------------------------------------------------------------- Physician Orders Details Patient Name: Date of Service: Joshua Duel A. 03/06/2023 8:45 A M Medical Record Number: [id number] Patient Account Number: [id number] Date of Birth/Sex: Treating RN: [birth year] (72 y.o. Tammy Sours Primary Care Provider: Creola Corn Other Clinician: Referring Provider: Treating Provider/Extender: Octavia Bruckner in Treatment: 6 Verbal / Phone Orders: No Diagnosis Coding Discharge From [facility] Services Discharge from Wound Care Center - Call if any future wound care needs. check feet daily. Electronic Signature(s) Signed: 03/06/2023 12:30:48 PM By: Geralyn Corwin DO Entered By: Geralyn Corwin on 03/06/2023 10:32:56 -------------------------------------------------------------------------------- Problem List Details Patient Name: Date of Service: Joshua Duel A. 03/06/2023 8:45 A M Medical Record Number: [id number] Patient Account  Number: [id number] Date of Birth/Sex: Treating RN: 1951-08-10 (72 y.o. M) Primary Care Provider: Creola Corn Other Clinician: Referring Provider: Treating Provider/Extender: Octavia Bruckner in Treatment: 6 Active Problems ICD-10 Encounter Code Description Active Date MDM Diagnosis [phone] Non-pressure chronic ulcer of other part of left foot with fat layer exposed 01/22/2023 No Yes E11.621 Type 2 diabetes mellitus with foot ulcer 01/22/2023 No Yes HARLEN, TAIT A ([id number]) [id number] Page 3 of 7 G25.0 Essential tremor 01/22/2023 No Yes Inactive Problems Resolved Problems Electronic Signature(s) Signed: 03/06/2023 12:30:48 PM By: Geralyn Corwin DO Entered By: Geralyn Corwin on 03/06/2023 10:30:07 -------------------------------------------------------------------------------- Progress Note Details Patient Name: Date of Service: Joshua Duel A. 03/06/2023 8:45 A M Medical Record Number: [id number] Patient Account Number: [id number] Date of Birth/Sex: Treating RN: 03-12-1951 (72 y.o. M) Primary Care Provider: Creola Corn Other Clinician: Referring Provider: Treating Provider/Extender: Octavia Bruckner in Treatment: 6 Subjective Chief Complaint Information obtained from Patient 01/22/2023; left plantar diabetic foot ulcer History of Present Illness (HPI) The following HPI elements were documented for the patient's wound: Location: left foot was the initial wound and then he has one on the right foot Quality: Patient reports No Pain. Severity: Patient states wound(s) are getting worse. Duration: Patient has had the wound for > 10 months prior to seeking treatment at the wound center Context: The wound would happen gradually Modifying Factors: Patient wound(s)/ulcer(s) are worsening due to : nonhealing nature in spite of various medications and procedures by the podiatrist Dr. Loreta Ave 01/22/2023 Mr. Huey Nathe is a 72 year old male With a past medical history of insulin-dependent type 2 diabetes with last hemoglobin A1c of 7.9 complicated by peripheral neuropathy, essential tremors, and OSA that presents the clinic for a 3-53-month history of nonhealing ulcer to the plantar aspect of his left foot. He is not sure how it started but over time has progressively become larger. He is not using any offloading device. He has been placing a Band-Aid with antibiotic ointment to the wound bed. He has been following with Dr. Loreta Ave for this issue, podiatry. He last had an x-ray on 12/05/2022 that  showed no definitive cortical destruction suggesting osteomyelitis. He currently denies systemic signs of infection. 3/7; patient presents for follow-up. He has been using Hydrofera Blue to the wound bed. He has been using his surgical shoe with peg assist for offloading. He has been taking his oral antibiotics. He has no issues or complaints today. 3/14; patient presents for follow-up. He has been using Hydrofera Blue with Medihoney to the wound bed. He has been using his surgical shoe with peg assist for offloading. He has no issues or complaints today. 3/19; patient presents for follow-up. He has been using Hydrofera Blue and Medihoney to the wound bed. Plan is for the total contact cast today. He has no issues or complaints today. 3/21; patient presents for obligatory cast change. We have been using Hydrofera Blue and antibiotic ointment under the total contact cast. He tolerated the cast well and has no issues or complaints today. 3/26; patient presents for follow-up. We have been using Hydrofera Blue and antibiotic ointment under the total contact cast. There has been improvement in wound healing. 02/24/2023: The wound is very nearly healed; just a tiny narrow opening remains. 4/12; patient presents for follow-up. We have been using antibiotic ointment and Hydrofera Blue under the total contact cast. His wound is  healed. He threw away his surgical shoe and peg assist earlier in the admission. Patient History Information obtained from Patient. Family History Diabetes - Maternal Grandparents. Social History Former smoker, Alcohol Use - Moderate - 2 drinks weekly, Drug Use - No History, Caffeine Use - Daily. Medical History Eyes Patient has history of Cataracts - extraction bilat FREDRICH, CORY A ([id number]) [id number] Page 4 of 7 Denies history of Glaucoma, Optic Neuritis Ear/Nose/Mouth/Throat Denies history of Chronic sinus problems/congestion, Middle ear problems Hematologic/Lymphatic Denies history of Anemia, Hemophilia, Human Immunodeficiency Virus, Lymphedema, Sickle Cell Disease Respiratory Denies history of Aspiration, Asthma, Chronic Obstructive Pulmonary Disease (COPD), Pneumothorax, Sleep Apnea, Tuberculosis Cardiovascular Patient has history of Hypertension Denies history of Angina, Arrhythmia, Congestive Heart Failure, Coronary Artery Disease, Deep Vein Thrombosis, Hypotension, Myocardial Infarction, Peripheral Arterial Disease, Peripheral Venous Disease, Phlebitis, Vasculitis Gastrointestinal Denies history of Cirrhosis , Colitis, Crohnoos, Hepatitis A, Hepatitis B, Hepatitis C Endocrine Patient has history of Type II Diabetes Denies history of Type I Diabetes Genitourinary Denies history of End Stage Renal Disease Immunological Denies history of Lupus Erythematosus, Raynaudoos, Scleroderma Integumentary (Skin) Denies history of History of Burn Musculoskeletal Denies history of Gout, Rheumatoid Arthritis, Osteoarthritis, Osteomyelitis Neurologic Patient has history of Neuropathy Denies history of Dementia, Quadriplegia, Paraplegia, Seizure Disorder Oncologic Denies history of Received Chemotherapy, Received Radiation Psychiatric Denies history of Anorexia/bulimia, Confinement Anxiety Hospitalization/Surgery History - appendectomy. -  tonsillectomy. - vasectomy. - nasal septum surgery. Medical A Surgical History Notes nd Constitutional Symptoms (General Health) arthro knee surgery Cardiovascular h/o CVA , Neurologic central tremors Oncologic h/o melanoma Objective Constitutional respirations regular, non-labored and within target range for patient.. Vitals Time Taken: 8:15 AM, Temperature: 98.6 F, Pulse: 88 bpm, Respiratory Rate: 20 breaths/min, Blood Pressure: 150/83 mmHg. Cardiovascular 2+ dorsalis pedis/posterior tibialis pulses. Psychiatric pleasant and cooperative. General Notes: Epithelization to the previous wound site on the plantar aspect of the left foot. Integumentary (Hair, Skin) Wound #4 status is Open. Original cause of wound was Gradually Appeared. The date acquired was: 09/24/2022. The wound has been in treatment 6 weeks. The wound is located on the Left Foot. The wound measures 0cm length x 0cm width x 0cm depth; 0cm^2 area and 0cm^3 volume. There is no tunneling or undermining  noted. There is a none present amount of drainage noted. The wound margin is distinct with the outline attached to the wound base. There is no granulation within the wound bed. There is no necrotic tissue within the wound bed. The periwound skin appearance exhibited: Callus. The periwound skin appearance did not exhibit: Crepitus, Excoriation, Induration, Rash, Scarring, Dry/Scaly, Maceration, Atrophie Blanche, Cyanosis, Ecchymosis, Hemosiderin Staining, Mottled, Pallor, Rubor, Erythema. Periwound temperature was noted as No Abnormality. The periwound has tenderness on palpation. Assessment Active Problems ICD-10 Non-pressure chronic ulcer of other part of left foot with fat layer exposed Type 2 diabetes mellitus with foot ulcer Essential tremor NGUYEN, TODOROV A ([id number]) [id number] Page 5 of 7 Patient has done well with the total contact cast. His wound is healed. I recommended he continue  to use the surgical shoe with peg assist for the next 1 to 2 weeks to assure continued healing. We gave him a new 1 in office today. He may follow-up as needed. Plan Discharge From [facility] Services: Discharge from Wound Care Center - Call if any future wound care needs. check feet daily. 1. Surgical shoe with peg assist for the next 1-2 weeks 2. Follow-up as needed 3. Discharge from clinic due to closed wound Electronic Signature(s) Signed: 03/06/2023 12:30:48 PM By: Geralyn Corwin DO Entered By: Geralyn Corwin on 03/06/2023 10:34:40 -------------------------------------------------------------------------------- HxROS Details Patient Name: Date of Service: Joshua Duel A. 03/06/2023 8:45 A M Medical Record Number: [id number] Patient Account Number: [id number] Date of Birth/Sex: Treating RN: 1950-12-04 (72 y.o. M) Primary Care Provider: Creola Corn Other Clinician: Referring Provider: Treating Provider/Extender: Octavia Bruckner in Treatment: 6 Information Obtained From Patient Constitutional Symptoms (General Health) Medical History: Past Medical History Notes: arthro knee surgery Eyes Medical History: Positive for: Cataracts - extraction bilat Negative for: Glaucoma; Optic Neuritis Ear/Nose/Mouth/Throat Medical History: Negative for: Chronic sinus problems/congestion; Middle ear problems Hematologic/Lymphatic Medical History: Negative for: Anemia; Hemophilia; Human Immunodeficiency Virus; Lymphedema; Sickle Cell Disease Respiratory Medical History: Negative for: Aspiration; Asthma; Chronic Obstructive Pulmonary Disease (COPD); Pneumothorax; Sleep Apnea; Tuberculosis Cardiovascular Medical History: Positive for: Hypertension Negative for: Angina; Arrhythmia; Congestive Heart Failure; Coronary Artery Disease; Deep Vein Thrombosis; Hypotension; Myocardial Infarction; Peripheral Arterial Disease; Peripheral Venous Disease; Phlebitis; Vasculitis Past  Medical History Notes: h/o CVA , Gastrointestinal SUREN, PAYNE A ([id number]) [id number] Page 6 of 7 Medical History: Negative for: Cirrhosis ; Colitis; Crohns; Hepatitis A; Hepatitis B; Hepatitis C Endocrine Medical History: Positive for: Type II Diabetes Negative for: Type I Diabetes Time with diabetes: since 2010 Treated with: Insulin Blood sugar tested every day: Yes Tested : daily Blood sugar testing results: Breakfast: 142 Genitourinary Medical History: Negative for: End Stage Renal Disease Immunological Medical History: Negative for: Lupus Erythematosus; Raynauds; Scleroderma Integumentary (Skin) Medical History: Negative for: History of Burn Musculoskeletal Medical History: Negative for: Gout; Rheumatoid Arthritis; Osteoarthritis; Osteomyelitis Neurologic Medical History: Positive for: Neuropathy Negative for: Dementia; Quadriplegia; Paraplegia; Seizure Disorder Past Medical History Notes: central tremors Oncologic Medical History: Negative for: Received Chemotherapy; Received Radiation Past Medical History Notes: h/o melanoma Psychiatric Medical History: Negative for: Anorexia/bulimia; Confinement Anxiety HBO Extended History Items Eyes: Cataracts Immunizations Pneumococcal Vaccine: Received Pneumococcal Vaccination: No Immunization Notes: unknown Implantable Devices No devices added Hospitalization / Surgery History Type of Hospitalization/Surgery appendectomy tonsillectomy vasectomy nasal septum surgery Family and Social History Diabetes: Yes - Maternal Grandparents; Former smoker; Alcohol Use: Moderate - 2 drinks weekly; Drug Use: No History; Caffeine Use: Daily; Financial Concerns: No; Food, Clothing or Shelter Needs:  No; Support System Lacking: No; Transportation Concerns: No IKER, NUTTALL ([id number]) [id number] Page 7 of 7 Electronic Signature(s) Signed: 03/06/2023 12:30:48 PM  By: Geralyn Corwin DO Entered By: Geralyn Corwin on 03/06/2023 10:32:10 -------------------------------------------------------------------------------- SuperBill Details Patient Name: Date of Service: Joshua Duel A. 03/06/2023 Medical Record Number: [id number] Patient Account Number: [id number] Date of Birth/Sex: Treating RN: 13-Feb-1951 (71 y.o. Tammy Sours Primary Care Provider: Creola Corn Other Clinician: Referring Provider: Treating Provider/Extender: Octavia Bruckner in Treatment: 6 Diagnosis Coding ICD-10 Codes Code Description [phone] Non-pressure chronic ulcer of other part of left foot with fat layer exposed E11.621 Type 2 diabetes mellitus with foot ulcer G25.0 Essential tremor Facility Procedures : CPT4 Code: [id number] Description: [phone] - WOUND CARE VISIT-LEV 2 EST PT Modifier: Quantity: 1 Physician Procedures : CPT4 Code Description Modifier [id number] - WC PHYS LEVEL 3 - EST PT ICD-10 Diagnosis Description L97.522 Non-pressure chronic ulcer of other part of left foot with fat layer exposed E11.621 Type 2 diabetes mellitus with foot ulcer G25.0 Essential  tremor Quantity: 1 Electronic Signature(s) Signed: 03/06/2023 12:30:48 PM By: Geralyn Corwin DO Entered By: Geralyn Corwin on 03/06/2023 10:34:51

## 2023-03-12 NOTE — Progress Notes (Signed)
Joshua [name], Joshua [name] ([id number]) [id number] Page 1 of 7 Visit Report for 02/10/2023 Arrival Information Details Patient Name: Date of Service: [name] 02/10/2023 2:45 PM Medical Record Number: [id number] Patient Account Number: [id number] Date of Birth/Sex: Treating RN: Joshua [name] (72 y.o. M) Primary Care Joshua [name]: Joshua [name] Other Clinician: Referring Joshua [name]: Treating Joshua [name]/Extender: Joshua [name] in Treatment: 2 Visit Information History Since Last Visit Added or deleted any medications: No Patient Arrived: Ambulatory Any new allergies or adverse reactions: No Arrival Time: 14:57 Had [name] fall or experienced change in No Accompanied By: self activities of daily living that may affect Transfer Assistance: None risk of falls: Patient Identification Verified: Yes Signs or symptoms of abuse/neglect since last visito No Secondary Verification Process Completed: Yes Hospitalized since last visit: No Patient Requires Transmission-Based Precautions: No Implantable device outside of the clinic excluding No Patient Has Alerts: Yes cellular tissue based products placed in the center since last visit: Has Dressing in Place as Prescribed: Yes Has Footwear/Offloading in Place as Prescribed: Yes Left: Other:peg assist Pain Present Now: No Electronic Signature(s) Signed: 02/16/2023 4:16:21 PM By: Joshua [name] Entered By: Joshua [name] on 02/10/2023 14:59:45 -------------------------------------------------------------------------------- Encounter Discharge Information Details Patient Name: Date of Service: Joshua [name]. 02/10/2023 2:45 PM Medical Record Number: [id number] Patient Account Number: [id number] Date of Birth/Sex: Treating RN: [birth year] [name] (72 y.o. Joshua [name] Primary Care Joshua [name]: Joshua [name] Other Clinician: Referring Joshua [name]: Treating Joshua [name]/Extender: Joshua [name] in Treatment:  2 Encounter Discharge Information Items Post Procedure Vitals Discharge Condition: Stable Temperature (F): 98.2 Ambulatory Status: Ambulatory Pulse (bpm): 80 Discharge Destination: Home Respiratory Rate (breaths/min): 18 Transportation: Private Auto Blood Pressure (mmHg): 164/89 Accompanied By: self Schedule Follow-up Appointment: Yes Clinical Summary of Care: Patient Declined Electronic Signature(s) Signed: 03/12/2023 1:55:51 PM By: Joshua [name] Entered By: Joshua [name] on 02/10/2023 16:20:52 -------------------------------------------------------------------------------- Lower Extremity Assessment Details Patient Name: Date of Service: Joshua [name]. 02/10/2023 2:45 PM Medical Record Number: [id number] Patient Account Number: [id number] Date of Birth/Sex: Treating RN: [birth year][name] (72 y.o. M) Primary Care Joshua [name]: Joshua [name] Other Clinician: Referring Joshua [name]: Treating Joshua [name]/Extender: Joshua [name] in Treatment: 2 Edema Assessment Left: [Left: Right] [Right: :] Assessed: [Left: No] [Right: No] Edema: [Left: N] [Right: o] Calf Left: Right: Point of Measurement: 40 cm From Medial Instep 40.5 cm Ankle Left: Right: Point of Measurement: 11 cm From Medial Instep 26 cm Electronic Signature(s) Signed: 02/16/2023 4:16:21 PM By: Joshua [name] Entered By: Joshua [name] on 02/10/2023 15:07:45 -------------------------------------------------------------------------------- Multi Wound Chart Details Patient Name: Date of Service: Joshua [name]. 02/10/2023 2:45 PM Medical Record Number: [id number] Patient Account Number: [id number] Date of Birth/Sex: Treating RN: [name] (71 y.o. Joshua [name] Primary Care Joshua [name]: Joshua [name] Other Clinician: Referring Joshua [name]: Treating Joshua [name]/Extender: Joshua [name] in Treatment: 2 Vital Signs Height(in): Pulse(bpm): 80 Weight(lbs): Blood Pressure(mmHg): 164/89 Body  Mass Index(BMI): Temperature(F): 98.2 Respiratory Rate(breaths/min): 18 [4:Photos:] [[name]:[name]] Left Foot [name] [name] Wound Location: Gradually Appeared [name] [name] Wounding Event: Diabetic Wound/Ulcer of the Lower [name] [name] Primary Etiology: Extremity Cataracts, Hypertension, Type II [name] [name] Comorbid History: Diabetes, Neuropathy 09/24/2022 [name] [name] Date Acquired: 2 [name] [name] Weeks of Treatment: Open [name] [name] Wound Status: No [name] [name] Wound Recurrence: Yes [name] [name] Pending [name] mputation on Presentation: 0.3x0.1x0.4 [name] [name] Measurements L x W x D (cm) 0.024 [name] [name] [name] (cm) : rea 0.009 [name] [name] Volume (cm) : 87.80% [name]  [name] % Reduction in [name] rea: 93.40% [name] [name] % Reduction in Volume: 12 Starting Position 1 (o'clock): 12 Ending Position 1 (o'clock): 0.3 Maximum Distance 1 (cm): Yes [name] [name] Undermining: Grade 1 [name] [name] Classification: Medium [name] [name] Exudate [name] mount: Serosanguineous [name] [name] Exudate Type: red, brown [name] [name] Exudate Color: Yes [name] [name] Foul Odor [name] Cleansing: fter No [name] [name] Odor [name] nticipated Due to Product Use: Distinct, outline attached [name] [name] Wound Margin: Large (67-100%) [name] [name] Granulation [name] mountPARVIN, Joshua [name] ([id number]) [id number] Page 3 of 7 Red, Pink [name] [name] Granulation Quality: None Present (0%) [name] [name] Necrotic Amount: Fat Layer (Subcutaneous Tissue): Yes [name] [name] Exposed Structures: Fascia: No Tendon: No Muscle: No Joint: No Bone: No None [name] [name] Epithelialization: Debridement - Excisional [name] [name] Debridement: Pre-procedure Verification/Time Out 15:30 [name] [name] Taken: Lidocaine 4% Topical Solution [name] [name] Pain Control: Callus, Subcutaneous, Slough [name] [name] Tissue Debrided: Skin/Subcutaneous Tissue [name] [name] Level: 1 [name] [name] Debridement [name] (sq cm): rea Curette [name] [name] Instrument: Minimum [name] [name] Bleeding: Pressure [name] [name] Hemostasis [name] chieved: 0 [name] [name] Procedural Pain: 0 [name] [name] Post Procedural  Pain: Procedure was tolerated well [name] [name] Debridement Treatment Response: 0.5x0.5x0.3 [name] [name] Post Debridement Measurements L x W x D (cm) 0.059 [name] [name] Post Debridement Volume: (cm) Callus: Yes [name] [name] Periwound Skin Texture: Excoriation: No Induration: No Crepitus: No Rash: No Scarring: No Maceration: No [name] [name] Periwound Skin Moisture: Dry/Scaly: No Atrophie Blanche: No [name] [name] Periwound Skin Color: Cyanosis: No Ecchymosis: No Erythema: No Hemosiderin Staining: No Mottled: No Pallor: No Rubor: No No Abnormality [name] [name] Temperature: Yes [name] [name] Tenderness on Palpation: Debridement [name] [name] Procedures Performed: T Contact Cast otal Treatment Notes Wound #4 (Foot) Wound Laterality: Left Cleanser Vashe 5.8 (oz) Discharge Instruction: Cleanse the wound with Vashe prior to applying [name] clean dressing using gauze sponges, not tissue or cotton balls. Peri-Wound Care Topical Gentamicin Discharge Instruction: As directed by physician Primary Dressing Hydrofera Blue Ready Transfer Foam, 4x5 (in/in) Discharge Instruction: Apply to wound bed as instructed Secondary Dressing Optifoam Non-Adhesive Dressing, 4x4 in Discharge Instruction: Apply foam donut over primary dressing as directed. Woven Gauze Sponge, Non-Sterile 4x4 in Discharge Instruction: Apply over primary dressing as directed. Secured With 72M Medipore H Soft Cloth Surgical T ape, 4 x 10 (in/yd) Discharge Instruction: Secure with tape as directed. Compression Wrap Compression Stockings Add-Ons Joshua [name], Joshua [name] ([id number]) [id number] Page 4 of 7 Electronic Signature(s) Signed: 02/10/2023 5:03:25 PM By: Geralyn Corwin DO Signed: 02/11/2023 5:57:17 PM By: Zenaida Deed RN, BSN Entered By: Geralyn Corwin on 02/10/2023 16:46:54 -------------------------------------------------------------------------------- Multi-Disciplinary Care Plan Details Patient Name: Date of Service: Joshua [name]. 02/10/2023 2:45 PM Medical Record Number: [id number] Patient Account Number: [id number] Date of Birth/Sex: Treating RN: [birth year][name] (72 y.o. Joshua [name] Primary Care Langley Ingalls: Joshua [name] Other Clinician: Referring Hassani Sliney: Treating Kalani Baray/Extender: Joshua [name] in Treatment: 2 Active Inactive Wound/Skin Impairment Nursing Diagnoses: Impaired tissue integrity Knowledge deficit related to ulceration/compromised skin integrity Goals: Patient will have [name] decrease in wound volume by X% from date: (specify in notes) Date Initiated: 01/22/2023 Target Resolution Date: 02/21/2023 Goal Status: Active Patient/caregiver will verbalize understanding of skin care regimen Date Initiated: 01/22/2023 Target Resolution Date: 02/21/2023 Goal Status: Active Ulcer/skin breakdown will have [name] volume reduction of 30% by week 4 Date Initiated: 01/22/2023 Target Resolution Date: 02/21/2023 Goal Status: Active Interventions: Assess patient/caregiver ability to obtain necessary supplies Assess patient/caregiver ability to perform  ulcer/skin care regimen upon admission and as needed Assess ulceration(s) every visit Notes: Electronic Signature(s) Signed: 03/12/2023 1:55:51 PM By: Joshua [name] Entered By: Joshua [name] on 02/10/2023 15:14:59 -------------------------------------------------------------------------------- Pain Assessment Details Patient Name: Date of Service: Joshua [name]. 02/10/2023 2:45 PM Medical Record Number: [id number] Patient Account Number: [id number] Date of Birth/Sex: Treating RN: [name][birth year] (72 y.o. M) Primary Care Zach Tietje: Joshua [name] Other Clinician: Referring Domonick Sittner: Treating Erilyn Pearman/Extender: Joshua [name] in Treatment: 2 Active Problems Location of Pain Severity and Description of Pain Patient Has Paino No Site Locations Joshua [name], Joshua [name] ([id number]) [id number] Page 5  of 7 Pain Management and Medication Current Pain Management: Electronic Signature(s) Signed: 02/16/2023 4:16:21 PM By: Joshua [name] Entered By: Joshua [name] on 02/10/2023 15:02:02 -------------------------------------------------------------------------------- Patient/Caregiver Education Details Patient Name: Date of Service: [name] 3/19/2024andnbsp2:45 PM Medical Record Number: [id number] Patient Account Number: [id number] Date of Birth/Gender: Treating RN: [name][birth year] (72 y.o. Joshua [name] Primary Care Physician: Joshua [name] Other Clinician: Referring Physician: Treating Physician/Extender: Joshua [name] in Treatment: 2 Education Assessment Education Provided To: Patient Education Topics Provided Offloading: Methods: Explain/Verbal Responses: Reinforcements needed, State content correctly Wound/Skin Impairment: Methods: Explain/Verbal Responses: Reinforcements needed, State content correctly Electronic Signature(s) Signed: 03/12/2023 1:55:51 PM By: Joshua [name] Entered By: Joshua [name] on 02/10/2023 15:15:20 -------------------------------------------------------------------------------- Wound Assessment Details Patient Name: Date of Service: Joshua [name]. 02/10/2023 2:45 PM Medical Record Number: [id number] Patient Account Number: [id number] Date of Birth/Sex: Treating RN: [name]-[birth year] (73 y.o. M) Primary Care Michaell Grider: Joshua [name] Other Clinician: Referring Yasmin Bronaugh: Treating Myiah Petkus/Extender: Joshua [name] in Treatment: 2 Wound Status Wound Number: 4 Primary Etiology: Diabetic Wound/Ulcer of the Lower Extremity Wound Location: Left Foot Wound Status: Open Joshua [name], Joshua [name] ([id number]) [id number] Page 6 of 7 Wounding Event: Gradually Appeared Comorbid History: Cataracts, Hypertension, Type II Diabetes, Neuropathy Date Acquired: 09/24/2022 Weeks Of Treatment: 2 Clustered  Wound: No Pending Amputation On Presentation Photos Wound Measurements Length: (cm) 0.3 Width: (cm) 0.1 Depth: (cm) 0.4 Area: (cm) 0.024 Volume: (cm) 0.009 % Reduction in Area: 87.8% % Reduction in Volume: 93.4% Epithelialization: None Tunneling: No Undermining: Yes Starting Position (o'clock): 12 Ending Position (o'clock): 12 Maximum Distance: (cm) 0.3 Wound Description Classification: Grade 1 Wound Margin: Distinct, outline attached Exudate Amount: Medium Exudate Type: Serosanguineous Exudate Color: red, brown Foul Odor After Cleansing: Yes Due to Product Use: No Slough/Fibrino Yes Wound Bed Granulation Amount: Large (67-100%) Exposed Structure Granulation Quality: Red, Pink Fascia Exposed: No Necrotic Amount: None Present (0%) Fat Layer (Subcutaneous Tissue) Exposed: Yes Tendon Exposed: No Muscle Exposed: No Joint Exposed: No Bone Exposed: No Periwound Skin Texture Texture Color No Abnormalities Noted: No No Abnormalities Noted: No Callus: Yes Atrophie Blanche: No Crepitus: No Cyanosis: No Excoriation: No Ecchymosis: No Induration: No Erythema: No Rash: No Hemosiderin Staining: No Scarring: No Mottled: No Pallor: No Moisture Rubor: No No Abnormalities Noted: No Dry / Scaly: No Temperature / Pain Maceration: No Temperature: No Abnormality Tenderness on Palpation: Yes Electronic Signature(s) Signed: 03/12/2023 1:55:51 PM By: Joshua [name] Entered By: Joshua [name] on 02/10/2023 15:13:09 -------------------------------------------------------------------------------- Vitals Details Patient Name: Date of Service: Joshua [name]. 02/10/2023 2:45 PM Medical Record Number: [id number] Patient Account Number: [id number] Joshua [name], Joshua [name] ([id number]) [id number].pdf Page 7 of 7 Date of Birth/Sex: Treating RN: [birth year] (72 y.o. M) Primary Care Astrid Vides: Other Clinician: Creola [name] Referring Ellanie Oppedisano: Treating  Yaniah Thiemann/Extender: Joshua [name] in Treatment: 2  Vital Signs Time Taken: 15:00 Temperature (F): 98.2 Pulse (bpm): 80 Respiratory Rate (breaths/min): 18 Blood Pressure (mmHg): 164/89 Reference Range: 80 - 120 mg / dl Electronic Signature(s) Signed: 02/16/2023 4:16:21 PM By: Joshua [name] Entered By: Joshua [name] on 02/10/2023 15:01:55

## 2023-03-19 ENCOUNTER — Ambulatory Visit: Payer: Medicare PPO | Admitting: Podiatry

## 2023-03-25 ENCOUNTER — Other Ambulatory Visit: Payer: Self-pay | Admitting: Cardiology

## 2023-03-30 DIAGNOSIS — R251 Tremor, unspecified: Secondary | ICD-10-CM | POA: Diagnosis not present

## 2023-03-30 DIAGNOSIS — G250 Essential tremor: Secondary | ICD-10-CM | POA: Diagnosis not present

## 2023-03-31 ENCOUNTER — Telehealth: Payer: Self-pay | Admitting: Neurology

## 2023-03-31 NOTE — Telephone Encounter (Signed)
Talked with patient regarding his appt and he said they were very through with this patient. They started at the beginning and turned off DBS and started at the start. They are sending notes They do recommend he do PT for hand strengthen . Patient would still like to attend his appt with Dr. Arbutus [name]

## 2023-03-31 NOTE — Telephone Encounter (Signed)
Call pt. He saw movement at [facility] yesterday.  I don't think their notes are completed.  I want to see how he is doing and if they were able to come up with new dbs settings (and to see if still needs June f/u here)

## 2023-04-02 ENCOUNTER — Other Ambulatory Visit: Payer: Self-pay | Admitting: Cardiology

## 2023-04-03 ENCOUNTER — Ambulatory Visit: Payer: Medicare PPO | Admitting: Podiatry

## 2023-04-03 DIAGNOSIS — E1121 Type 2 diabetes mellitus with diabetic nephropathy: Secondary | ICD-10-CM | POA: Diagnosis not present

## 2023-04-09 ENCOUNTER — Ambulatory Visit: Payer: Medicare PPO | Admitting: Podiatry

## 2023-04-09 DIAGNOSIS — L97522 Non-pressure chronic ulcer of other part of left foot with fat layer exposed: Secondary | ICD-10-CM

## 2023-04-15 DIAGNOSIS — I129 Hypertensive chronic kidney disease with stage 1 through stage 4 chronic kidney disease, or unspecified chronic kidney disease: Secondary | ICD-10-CM | POA: Diagnosis not present

## 2023-04-15 DIAGNOSIS — E669 Obesity, unspecified: Secondary | ICD-10-CM | POA: Diagnosis not present

## 2023-04-15 DIAGNOSIS — E1121 Type 2 diabetes mellitus with diabetic nephropathy: Secondary | ICD-10-CM | POA: Diagnosis not present

## 2023-04-15 DIAGNOSIS — E785 Hyperlipidemia, unspecified: Secondary | ICD-10-CM | POA: Diagnosis not present

## 2023-04-15 DIAGNOSIS — Z794 Long term (current) use of insulin: Secondary | ICD-10-CM | POA: Diagnosis not present

## 2023-04-15 DIAGNOSIS — N1831 Chronic kidney disease, stage 3a: Secondary | ICD-10-CM | POA: Diagnosis not present

## 2023-04-15 DIAGNOSIS — E1129 Type 2 diabetes mellitus with other diabetic kidney complication: Secondary | ICD-10-CM | POA: Diagnosis not present

## 2023-04-15 NOTE — Progress Notes (Signed)
Subjective: Chief Complaint  Patient presents with   Foot Ulcer    Rm 14 Left foot wound check. Pt states he had been released for the wound center and states that his wound is healed per wound center. Pt states no pain, drainage. Some bleeding (minimal).    72 year old male presents the office for above concerns.  He was released from the wound care center as wound is healed however it is opened back up.  No pain or drainage.  Some bleeding he has noticed.  No fevers or chills.  No increase in swelling or redness.  Objective: AAO x3, NAD DP/PT pulses palpable bilaterally, CRT less than 3 seconds On the left foot submetatarsal 1 hyperkeratotic lesion.  Upon debridement vertical skin fissure type wound present granulation tissue present.  There is no probing, and or tunneling.  There is no fluctuation or crepitus.  There is no malodor. No pain with calf compression, swelling, warmth, erythema  Assessment: Recurrent ulceration left foot  Plan: -All treatment options discussed with the patient including all alternatives, risks, complications.  -Today I sharply debrided the ulcer utilizing a #312 with scalpel to remove nonviable, devitalized tissue to promote wound healing.  Debrided down to healthy, granular tissue.  No significant bleeding noted today.  There is no probing, amount or tunneling.  Cleaned with saline.  Silvadene was applied followed by dressing.  Continue daily dressing changes.  Dispensed offloading pads to help take further pressure off the area.  Monitor for any signs or symptoms of infection. -Patient encouraged to call the office with any questions, concerns, change in symptoms.   X-ray next appointment if ulcer is still present.   Vivi Barrack DPM

## 2023-04-23 NOTE — Progress Notes (Unsigned)
Assessment/Plan:   1.  Essential Tremor.  -The patient is status post left VIM DBS on July 07, 2019 with [facility] device.  Intention was originally to do a bilateral VIM DBS, but the patient had a vagal episode in the operating room with associated nausea and vomiting with positive CSF flow out the burr hole, and it was decided not to attempt the other side.    -reprogrammed dbs today.  -Unfortunately,  I think that the patient has developed ataxic tremor.  This is a very different tremor type and virtually impossible to control with DBS.  His tremor is fairly well-controlled until his May, 2022 infarct, which was positioned very close to the DBS lead in the thalamus.  I think that this likely affected the thalmocortical projection pathways and cerebellar outflow tracts.  He got another opinion at [facility] and they concurred.  Patient understands that there is nothing more that we can do for the tremor in the right arm.   2.  History of cerebral infarct, October, 2020 and May, 2022  -MRI in October, 2020 demonstrated acute right posterior limb of the internal capsule infarct.  However, stroke neurology felt that symptoms were more consistent with a posterior circulation stroke, related to thrombus in the left subclavian   -MRI in 2022 with small infarct in the thalamus on the left.  TEE was negative.  Patient was already on Eliquis (had just been restarted a few weeks before, as had been stopped for pericarditis).  -On atorvastatin, 80 mg, Zetia 10 mg.  LDL slightly above goal at 99 (goal less than 70)  -Patient's current hemoglobin A1c of 7.9, goal less than 7.0  -Patient currently on amlodipine 10, losartan 100 mg, Lopressor 25 mg twice per day -long-term blood pressure goal normotensive  -Patient on Eliquis.    3.  Dysphagia  -Modified barium swallow in June, 2022 was worse compared to October, 2021 with evidence of pharyngeal phase dysphagia.  4.  Hypertrophic  cardiomyopathy  -Following with cardiology.    -suspect that the prominent breathing is related to HCM.  He will f/u with cardiology   Subjective:   Joshua [name] was seen in follow-up in the movement disorder clinic for essential tremor.  Patient sought a second opinion at [facility] since our last visit.  He saw Dr. Evern Bio on May 6.  Unfortunately, Dr. Evern Bio concurred that the tremor in the right arm as a consequence of his stroke and is ataxic/dysmetric and there is nothing more to be done (with the exception of occupational therapy).  She agreed that DBS was working for the essential tremor.  Allergies  Allergen Reactions   Melatonin Other (See Comments)    dizzy    Current Outpatient Medications  Medication Instructions   amLODipine (NORVASC) 10 MG tablet TAKE ONE TABLET AT BEDTIME.   apixaban (ELIQUIS) 5 mg, Oral, 2 times daily   aspirin 81 MG EC tablet 1 tablet   atorvastatin (LIPITOR) 80 mg, Oral, Daily-1800   clindamycin (CLEOCIN) 150 mg, Oral, Every 6 hours   colchicine 0.6 MG tablet colchicine 0.6 mg tablet  TAKE ONE TABLET BY MOUTH TWICE DAILY.   Continuous Blood Gluc Sensor (FREESTYLE LIBRE 2 SENSOR) MISC See admin instructions   cycloSPORINE (RESTASIS) 0.05 % ophthalmic emulsion Ophthalmic   doxycycline (VIBRA-TABS) 100 mg, Oral, 2 times daily   DULoxetine (CYMBALTA) 60 mg, Oral, Daily   escitalopram (LEXAPRO) 20 mg, Oral, Daily   ezetimibe (ZETIA) 10 MG tablet TAKE ONE  TABLET BY MOUTH ONCE DAILY.   fenofibrate 54 mg, Oral, Daily   Fish Oil 2,000 mg, Oral, Daily, Reported on 02/13/2016   furosemide (LASIX) 40 mg, Oral, As needed   insulin lispro (HUMALOG) 15-25 Units, Subcutaneous, See admin instructions, Inject 15 units subcutaneously after breakfast and 25 units after lunch and supper    losartan (COZAAR) 100 MG tablet 1 tablet, Oral, Daily   Lutein-Zeaxanthin 25-5 MG CAPS 1 tablet, Oral, Daily   metFORMIN (GLUCOPHAGE) 500 MG tablet 1 tablet with a meal    metFORMIN (GLUCOPHAGE) 500 mg, Oral, Daily with breakfast   metoprolol succinate (TOPROL-XL) 50 mg, Oral, Daily, Take with or immediately following a meal.   mirtazapine (REMERON) 7.5 mg, Oral, Daily at bedtime   Multiple Vitamin (MULTIVITAMIN WITH MINERALS) TABS tablet 1 tablet, Oral, Daily   ONETOUCH VERIO test strip No dose, route, or frequency recorded.   Polyethyl Glycol-Propyl Glycol (SYSTANE OP) 1-2 drops, Both Eyes, 4 times daily PRN   PRESCRIPTION MEDICATION Inhalation, Daily at bedtime, CPAP    RESTASIS 0.05 % ophthalmic emulsion 1 drop, Both Eyes, 2 times daily PRN   silver sulfADIAZINE (SILVADENE) 1 % cream 1 Application, Topical, Daily   SURE COMFORT PEN NEEDLES 31G X 5 MM MISC No dose, route, or frequency recorded.   Toujeo SoloStar 80 Units, Subcutaneous, 2 times daily     Objective:   VITALS:   There were no vitals filed for this visit.     Gen:  Appears stated age and in NAD. HEENT:  Normocephalic, atraumatic. The mucous membranes are moist.     NEUROLOGICAL:  Orientation:  The patient is alert and oriented x 3.   Cranial nerves: There is good facial symmetry. Extraocular muscles are intact and visual fields are full to confrontational testing.  No significant dysphasia (has had this in the past) or dysarthria today.  Soft palate rises symmetrically and there is no tongue deviation. Hearing is intact to conversational tone. Tone: Tone is good throughout. Motor:  5/5 in the UE/LE   MOVEMENT EXAM: Movements: With the device off, there is very severe tremor on the right.  He has almost no tremor of the outstretched hands.  He has moderate tremor in the wing beating position on the right.  It is better post programming, but still prominent  I have reviewed and interpreted the following labs independently   Chemistry      Component Value Date/Time   NA 143 07/30/2022 1513   K 4.8 07/30/2022 1513   CL 104 07/30/2022 1513   CO2 24 07/30/2022 1513   BUN 23  07/30/2022 1513   CREATININE 1.71 (H) 07/30/2022 1513      Component Value Date/Time   CALCIUM 9.4 07/30/2022 1513   ALKPHOS 103 07/30/2022 1513   AST 19 07/30/2022 1513   ALT 30 07/30/2022 1513   BILITOT 0.3 07/30/2022 1513      Lab Results  Component Value Date   WBC 9.1 07/30/2022   HGB 13.0 07/30/2022   HCT 39.0 07/30/2022   MCV 90 07/30/2022   PLT 267 07/30/2022   Lab Results  Component Value Date   TSH 1.421 01/30/2021     CC:  Creola Corn, MD

## 2023-04-24 ENCOUNTER — Ambulatory Visit: Payer: Medicare PPO | Admitting: Podiatry

## 2023-04-27 ENCOUNTER — Encounter: Payer: Self-pay | Admitting: Neurology

## 2023-04-27 ENCOUNTER — Ambulatory Visit: Payer: Medicare PPO | Admitting: Neurology

## 2023-04-27 VITALS — BP 122/78 | HR 61 | Wt 256.8 lb

## 2023-04-27 DIAGNOSIS — R270 Ataxia, unspecified: Secondary | ICD-10-CM | POA: Diagnosis not present

## 2023-04-27 DIAGNOSIS — R413 Other amnesia: Secondary | ICD-10-CM | POA: Diagnosis not present

## 2023-04-27 DIAGNOSIS — G250 Essential tremor: Secondary | ICD-10-CM | POA: Diagnosis not present

## 2023-04-27 DIAGNOSIS — I422 Other hypertrophic cardiomyopathy: Secondary | ICD-10-CM

## 2023-04-27 NOTE — Procedures (Signed)
DBS Programming was performed.    Manufacturer of DBS device: [employer] was performed.    Manufacturer of DBS device: [employer] - no bluetooth  Total time spent programming was 10 minutes.  Device was confirmed to be on.  Soft start was confirmed to be on.  Impedences were checked and were within normal limits.  Battery was checked and was determined to be functioning normally and not near the end of life.  Final settings were as follows, with program 2 being active:   Active Contacts Amplitude (mA) PW (ms) Frequency (hz)   Left Brain       Program 1       11/28/2019 5-C+ 2.5 60 130   04/25/20 5-C+ 2.7 60 130   10/25/20 5-C+ 3.5(2.1-3.8) 60 130                               Program 2       11/28/19 5-8+ 2.2 60 130   04/25/20 5-8+ 3.2 60 130                         10/25/20 5-8+ 3.6 (3.0-4.0) 70 130   03/26/21 5-8+ 4.1 (3.0-4.4) 90 130   09/26/21 2-(40%)6-(60%)8+ 3.6 90 154 Higher frequency with tongue paresthesia  11/11/21 6-(50%)7-(50%)8+ 3.8 100 170   01/28/22 3-(50%)6-(50%)8+ 4.0 80 170 Mild lip paresthesia  04/14/22 3-(50%)6-(50%)8+ 4.1 90 179   10/20/22 3-(50%)6-(50%)8+ 4.5 90 179   04/27/23 2B-(50%)3B-(50%)4+ 4.5 90 198 Increased freq at [facility]                Right Brain       Not active                        Prior visits:  Monopolar review: Left brain electrode:     1-C+           ; Amplitude  1.0   ma   ; Pulse width 60 microseconds;   Frequency   130   Hz.  (Tongue paresthesias) Left brain electrode:     (2-3-4-) 33% each C+           ; Amplitude  2.0   ma   ; Pulse width 60 microseconds;   Frequency   130   Hz.  (Tongue and lip paresthesias, fairly good tremor control) Left brain electrode:     (5-6-7-) 33% C+           ; Amplitude  2.0   ma   ; Pulse width 60 microseconds;   Frequency   130   Hz.  (Tongue paresthesias but no lip paresthesia and good tremor control) Left brain electrode:     8-C+           ; Amplitude  1.0   ma   ; Pulse width 60  microseconds;   Frequency   130   Hz.  (Paresthesias resolved)

## 2023-04-27 NOTE — Patient Instructions (Signed)
Use the walker all the time in the home and out of the home! Let me know if you would like to start PT for the balance Go home and charge your device.    You have been referred for a neurocognitive evaluation (i.e., evaluation of memory and thinking abilities). Please bring someone with you to this appointment if possible, as it is helpful for the neuropsychologist to hear from both you and another adult who knows you well. Please bring eyeglasses and hearing aids if you wear them and take any medications as you normally would.    The evaluation will take approximately 2-3 hours and has two parts:   The first part is a clinical interview with the neuropsychologist, Dr. Milbert Coulter.  During the interview, the neuropsychologist will speak with you and the individual you brought to the appointment.    The second part of the evaluation is testing with the doctor's technician, aka psychometrician, Annabelle Harman or [employer]. During the testing, the technician will ask you to remember different types of material, solve problems, and answer some questionnaires. Your family member will not be present for this portion of the evaluation.   Please note: We have to reserve several hours of the neuropsychologist's time and the psychometrician's time for your evaluation appointment. As such, there is a No-Show fee of $100. If you are unable to attend any of your appointments, please contact our office as soon as possible to reschedule.   The physicians and staff at [facility] are committed to providing excellent care. You may receive a survey requesting feedback about your experience at our office. We strive to receive "very good" responses to the survey questions. If you feel that your experience would prevent you from giving the office a "very good " response, please contact our office to try to remedy the situation. We may be reached at [phone]. Thank you for taking the time out of your busy day to complete the survey.

## 2023-05-04 DIAGNOSIS — E1121 Type 2 diabetes mellitus with diabetic nephropathy: Secondary | ICD-10-CM | POA: Diagnosis not present

## 2023-05-08 ENCOUNTER — Ambulatory Visit: Payer: Medicare PPO | Admitting: Podiatry

## 2023-05-08 ENCOUNTER — Encounter: Payer: Self-pay | Admitting: Podiatry

## 2023-05-08 ENCOUNTER — Ambulatory Visit (INDEPENDENT_AMBULATORY_CARE_PROVIDER_SITE_OTHER): Payer: Medicare PPO

## 2023-05-08 DIAGNOSIS — L97522 Non-pressure chronic ulcer of other part of left foot with fat layer exposed: Secondary | ICD-10-CM

## 2023-05-08 DIAGNOSIS — Z7409 Other reduced mobility: Secondary | ICD-10-CM | POA: Insufficient documentation

## 2023-05-08 MED ORDER — DOXYCYCLINE HYCLATE 100 MG PO TABS
100.0000 mg | ORAL_TABLET | Freq: Two times a day (BID) | ORAL | 0 refills | Status: DC
Start: 2023-05-08 — End: 2023-07-30

## 2023-05-11 NOTE — Progress Notes (Signed)
Subjective: Chief Complaint  Patient presents with   Foot Ulcer    Follow up ulcer sub 1st MPJ left   "Its worse than what it was and I got a blister on top of my foot"    72 year old male presents the office for above concerns.  He is concerned that the wound has been getting worse.  He also has noticed a blister on the top of his left foot.  He is not sure how it started.  No purulence.  No fevers or chills.  Objective: AAO x3, NAD DP/PT pulses palpable bilaterally, CRT less than 3 seconds On the left foot submetatarsal 1 is a granular ulceration measuring 2.5 x 2 cm with a depth of 0.1 cm.  There is no probing, undermining or tunneling.  There is no surrounding erythema, ascending cellulitis.  There is no drainage or pus.  There is no fluctuation, crepitation, malodor. On the dorsal lateral forefoot there is a clear blister which is able to drain today there is no purulence.  No swelling erythema.  I think this is coming more from the bandages as you can see the indentation of the bandage just adjacent to this. No pain with calf compression, swelling, warmth, erythema  Assessment: Recurrent ulceration left foot  Plan: -All treatment options discussed with the patient including all alternatives, risks, complications.  -X-rays were obtained and reviewed of the left foot.  3 views were obtained.  No definitive cortical changes suggest osteomyelitis.  No soft tissue edema.  Arthritic changes present.  Hammertoes noted. -Today I sharply debrided the ulcer utilizing a #312 with scalpel to remove nonviable, devitalized tissue to promote wound healing.  Debrided down to healthy, granular tissue.  No significant bleeding noted today.  Pre and post wound measurements are the same. There is no probing, amount or tunneling.  Cleaned with saline.  Silvadene was applied followed by dressing.  Continue daily dressing changes.  Dispensed offloading pads to help take further pressure off the area.  Monitor  for any signs or symptoms of infection. -Doxycycline -Ordered updated ABI -Referral back to the wound care center due to reoccurrence of the wound -Patient encouraged to call the office with any questions, concerns, change in symptoms.   Return in about 10 days (around 05/18/2023) for ulcer.  Vivi Barrack DPM

## 2023-05-14 ENCOUNTER — Encounter: Payer: Self-pay | Admitting: Podiatry

## 2023-05-18 ENCOUNTER — Ambulatory Visit: Payer: Medicare PPO | Admitting: Podiatry

## 2023-05-22 ENCOUNTER — Ambulatory Visit (HOSPITAL_COMMUNITY)
Admission: RE | Admit: 2023-05-22 | Discharge: 2023-05-22 | Disposition: A | Discharge status: Home / Self Care | Payer: Medicare PPO | Source: Ambulatory Visit | Attending: Podiatry | Admitting: Podiatry

## 2023-05-22 DIAGNOSIS — L97522 Non-pressure chronic ulcer of other part of left foot with fat layer exposed: Secondary | ICD-10-CM | POA: Insufficient documentation

## 2023-05-22 LAB — VAS US PAD ABI: Right ABI: 1.24

## 2023-05-29 ENCOUNTER — Encounter (HOSPITAL_BASED_OUTPATIENT_CLINIC_OR_DEPARTMENT_OTHER): Payer: Medicare PPO | Attending: Internal Medicine | Admitting: Internal Medicine

## 2023-05-29 DIAGNOSIS — Z794 Long term (current) use of insulin: Secondary | ICD-10-CM | POA: Diagnosis not present

## 2023-05-29 DIAGNOSIS — Z96652 Presence of left artificial knee joint: Secondary | ICD-10-CM | POA: Diagnosis not present

## 2023-05-29 DIAGNOSIS — E11621 Type 2 diabetes mellitus with foot ulcer: Secondary | ICD-10-CM | POA: Diagnosis not present

## 2023-05-29 DIAGNOSIS — I10 Essential (primary) hypertension: Secondary | ICD-10-CM | POA: Insufficient documentation

## 2023-05-29 DIAGNOSIS — L97522 Non-pressure chronic ulcer of other part of left foot with fat layer exposed: Secondary | ICD-10-CM

## 2023-05-29 DIAGNOSIS — E1142 Type 2 diabetes mellitus with diabetic polyneuropathy: Secondary | ICD-10-CM | POA: Diagnosis not present

## 2023-05-29 DIAGNOSIS — E1151 Type 2 diabetes mellitus with diabetic peripheral angiopathy without gangrene: Secondary | ICD-10-CM | POA: Diagnosis not present

## 2023-05-29 DIAGNOSIS — G4733 Obstructive sleep apnea (adult) (pediatric): Secondary | ICD-10-CM | POA: Insufficient documentation

## 2023-05-29 NOTE — Progress Notes (Signed)
MARQUICE, CRABBE A ([id number]) [phone].pdf Page 1 of 4 Visit Report for 05/29/2023 Abuse Risk Screen Details Patient Name: Date of Service: Joshua [name] 05/29/2023 8:00 A M Medical Record Number: [id number] Patient Account Number: [id number] Date of Birth/Sex: Treating RN: 1951-08-02 (72 y.o. Dianna Limbo Primary Care Maleny Candy: Creola Corn Other Clinician: Referring Niya Behler: Treating Zamoria Boss/Extender: Jenene Slicker in Treatment: 0 Abuse Risk Screen Items Answer ABUSE RISK SCREEN: Has anyone close to you tried to hurt or harm you recentlyo No Do you feel uncomfortable with anyone in your familyo No Has anyone forced you do things that you didnt want to doo No Electronic Signature(s) Signed: 05/29/2023 1:37:42 PM By: Karie Schwalbe RN Entered By: Karie Schwalbe on 05/29/2023 08:18:42 -------------------------------------------------------------------------------- Activities of Daily Living Details Patient Name: Date of Service: Joshua Theresa Duty A. 05/29/2023 8:00 A M Medical Record Number: [id number] Patient Account Number: [id number] Date of Birth/Sex: Treating RN: 1951/01/21 (72 y.o. Dianna Limbo Primary Care Cythnia Osmun: Creola Corn Other Clinician: Referring Grigor Lipschutz: Treating Lynnex Fulp/Extender: Jenene Slicker in Treatment: 0 Activities of Daily Living Items Answer Activities of Daily Living (Please select one for each item) Drive Automobile Completely Able T Medications ake Completely Able Use T elephone Completely Able Care for Appearance Completely Able Use T oilet Completely Able Bath / Shower Completely Able Dress Self Completely Able Feed Self Completely Able Walk Completely Able Get In / Out Bed Completely Able Housework Completely Able Prepare Meals Completely Able Handle Money Completely Able Shop for Self Completely Able Electronic Signature(s) Signed: 05/29/2023  1:37:42 PM By: Karie Schwalbe RN Entered By: Karie Schwalbe on 05/29/2023 08:19:25 [name] ([id number]) [id number] Page 2 of 4 -------------------------------------------------------------------------------- Education Screening Details Patient Name: Date of Service: Joshua [name] 05/29/2023 8:00 A M Medical Record Number: [id number] Patient Account Number: [id number] Date of Birth/Sex: Treating RN: 1951/11/11 (72 y.o. Dianna Limbo Primary Care Kaleb Linquist: Creola Corn Other Clinician: Referring Poppy Mcafee: Treating Andyn Sales/Extender: Jenene Slicker in Treatment: 0 Primary Learner Assessed: Patient Learning Preferences/Education Level/Primary Language Learning Preference: Explanation, Demonstration, Printed Material Highest Education Level: College or Above Preferred Language: English Cognitive Barrier Language Barrier: No Translator Needed: No Memory Deficit: No Emotional Barrier: No Cultural/Religious Beliefs Affecting Medical Care: No Physical Barrier Impaired Vision: No Impaired Hearing: Yes Hearing Impaired Decreased Hand dexterity: No Knowledge/Comprehension Knowledge Level: High Comprehension Level: High Ability to understand written instructions: High Ability to understand verbal instructions: High Motivation Anxiety Level: Calm Cooperation: Cooperative Education Importance: Acknowledges Need Interest in Health Problems: Asks Questions Perception: Coherent Willingness to Engage in Self-Management High Activities: Readiness to Engage in Self-Management High Activities: Electronic Signature(s) Signed: 05/29/2023 1:37:42 PM By: Karie Schwalbe RN Entered By: Karie Schwalbe on 05/29/2023 08:20:24 -------------------------------------------------------------------------------- Fall Risk Assessment Details Patient Name: Date of Service: Joshua Theresa Duty A. 05/29/2023 8:00 A M Medical Record  Number: [id number] Patient Account Number: [id number] Date of Birth/Sex: Treating RN: 02-Oct-1951 (72 y.o. Dianna Limbo Primary Care Chantee Cerino: Creola Corn Other Clinician: Referring Aden Sek: Treating Vivek Grealish/Extender: Jenene Slicker in Treatment: 0 Fall Risk Assessment Items Have you had 2 or more falls in the last 12 monthso 0 No CORNEILUS, SALAH A ([id number]) [id number] Page 3 of 4 Have you had any fall that resulted in injury in the last 12 monthso 0 No FALLS RISK SCREEN History of falling - immediate or within 3 months 0 No Secondary diagnosis (Do  you have 2 or more medical diagnoseso) 0 No Ambulatory aid None/bed rest/wheelchair/nurse 0 No Crutches/cane/walker 0 No Furniture 0 No Intravenous therapy Access/Saline/Heparin Lock 0 No Gait/Transferring Normal/ bed rest/ wheelchair 0 No Weak (short steps with or without shuffle, stooped but able to lift head while walking, may seek 0 No support from furniture) Impaired (short steps with shuffle, may have difficulty arising from chair, head down, impaired 0 No balance) Mental Status Oriented to own ability 0 No Electronic Signature(s) Signed: 05/29/2023 1:37:42 PM By: Karie Schwalbe RN Entered By: Karie Schwalbe on 05/29/2023 08:20:32 -------------------------------------------------------------------------------- Foot Assessment Details Patient Name: Date of Service: Norlene Duel A. 05/29/2023 8:00 A M Medical Record Number: [id number] Patient Account Number: [id number] Date of Birth/Sex: Treating RN: 05-17-1951 (72 y.o. Dianna Limbo Primary Care Jamarien Rodkey: Creola Corn Other Clinician: Referring Oletha Tolson: Treating Yenny Kosa/Extender: Jenene Slicker in Treatment: 0 Foot Assessment Items Site Locations + = Sensation present, - = Sensation absent, C = Callus, U = Ulcer R = Redness, W = Warmth, M = Maceration, PU = Pre-ulcerative  lesion F = Fissure, S = Swelling, D = Dryness Assessment Right: Left: Other Deformity: No No Prior Foot Ulcer: No No Prior Amputation: No No Charcot Joint: No No Ambulatory Status: Ambulatory With Help Assistance Device: NICHOLUS, FERGUS ([id number]) [id number] Page 4 of 4 Gait: Steady Electronic Signature(s) Signed: 05/29/2023 1:37:42 PM By: Karie Schwalbe RN Entered By: Karie Schwalbe on 05/29/2023 08:22:57 -------------------------------------------------------------------------------- Nutrition Risk Screening Details Patient Name: Date of Service: Joshua Theresa Duty A. 05/29/2023 8:00 A M Medical Record Number: [id number] Patient Account Number: [id number] Date of Birth/Sex: Treating RN: 1951/09/26 (72 y.o. Dianna Limbo Primary Care Alfons Sulkowski: Creola Corn Other Clinician: Referring Ashara Lounsbury: Treating Malone Admire/Extender: Jenene Slicker in Treatment: 0 Height (in): 76 Weight (lbs): 250 Body Mass Index (BMI): 30.4 Nutrition Risk Screening Items Score Screening NUTRITION RISK SCREEN: I have an illness or condition that made me change the kind and/or amount of food I eat 0 No I eat fewer than two meals per day 0 No I eat few fruits and vegetables, or milk products 0 No I have three or more drinks of beer, liquor or wine almost every day 0 No I have tooth or mouth problems that make it hard for me to eat 0 No I don't always have enough money to buy the food I need 0 No I eat alone most of the time 0 No I take three or more different prescribed or over-the-counter drugs a day 0 No Without wanting to, I have lost or gained 10 pounds in the last six months 0 No I am not always physically able to shop, cook and/or feed myself 0 No Nutrition Protocols Good Risk Protocol 0 No interventions needed Moderate Risk Protocol High Risk Proctocol Risk Level: Good Risk Score: 0 Electronic Signature(s) Signed: 05/29/2023  1:37:42 PM By: Karie Schwalbe RN Entered By: Karie Schwalbe on 05/29/2023 08:20:47

## 2023-05-29 NOTE — Progress Notes (Signed)
Joshua, GEAN [name] ([id number]) [id number] Page 1 of 9 Visit Report for 05/29/2023 Allergy List Details Patient Name: Date of Service: Joshua [name] 05/29/2023 8:00 [name] Medical Record Number: [id number] Patient Account Number: [id number] Date of Birth/Sex: Treating RN: May 30, 1951 (72 y.o. Tammy Sours Primary Care Elige Shouse: Creola Corn Other Clinician: Referring Paxton Kanaan: Treating Marionna Gonia/Extender: Jenene Slicker in Treatment: 0 Allergies Active Allergies melatonin Allergy Notes Electronic Signature(s) Signed: 05/29/2023 1:37:42 PM By: Karie Schwalbe RN Entered By: Karie Schwalbe on 05/29/2023 08:17:37 -------------------------------------------------------------------------------- Arrival Information Details Patient Name: Date of Service: Joshua [name]. 05/29/2023 8:00 [name] Medical Record Number: [id number] Patient Account Number: [id number] Date of Birth/Sex: Treating RN: [birth year] (72 y.o. Dianna Limbo Primary Care Peder Allums: Creola Corn Other Clinician: Referring Lajuanda Penick: Treating Timiyah Romito/Extender: Jenene Slicker in Treatment: 0 Visit Information Patient Arrived: Cloyde Reams Time: 08:14 Accompanied By: self Transfer Assistance: None Patient Identification Verified: Yes Patient Has Alerts: Yes Patient Alerts: ABI R 1.24 (05/22/23) TBI R 0.81 (05/22/23) ABI L [place] (05/22/23) TBI L 1.05 (05/22/23) ELIQUIS History Since Last Visit Added or deleted any medications: No Any new allergies or adverse reactions: No Had [name] fall or experienced change in activities of daily living that may affect risk of falls: No Signs or symptoms of abuse/neglect since last visito No Hospitalized since last visit: No Implantable device outside of the clinic excluding cellular tissue based products placed in the center since last visit: No Electronic Signature(s) Signed: 05/29/2023 1:37:42 PM By: Karie Schwalbe RN Entered By: Karie Schwalbe on 05/29/2023 08:16:40 Joshua [name] ([id number]) [id number] Page 2 of 9 -------------------------------------------------------------------------------- Clinic Level of Care Assessment Details Patient Name: Date of Service: Joshua [name] 05/29/2023 8:00 [name] Medical Record Number: [id number] Patient Account Number: [id number] Date of Birth/Sex: Treating RN: [birth year] (72 y.o. Dianna Limbo Primary Care Tywaun Hiltner: Creola Corn Other Clinician: Referring Cashe Gatt: Treating Seniya Stoffers/Extender: Jenene Slicker in Treatment: 0 Clinic Level of Care Assessment Items TOOL 1 Quantity Score X- 1 0 Use when EandM and Procedure is performed on INITIAL visit ASSESSMENTS - Nursing Assessment / Reassessment X- 1 20 General Physical Exam (combine w/ comprehensive assessment (listed just below) when performed on new pt. evals) X- 1 25 Comprehensive Assessment (HX, ROS, Risk Assessments, Wounds Hx, etc.) ASSESSMENTS - Wound and Skin Assessment / Reassessment []  - 0 Dermatologic / Skin Assessment (not related to wound area) ASSESSMENTS - Ostomy and/or Continence Assessment and Care []  - 0 Incontinence Assessment and Management []  - 0 Ostomy Care Assessment and Management (repouching, etc.) PROCESS - Coordination of Care X - Simple Patient / Family Education for ongoing care 1 15 []  - 0 Complex (extensive) Patient / Family Education for ongoing care X- 1 10 Staff obtains Chiropractor, Records, T Results / Process Orders est X- 1 10 Staff telephones HHA, Nursing Homes / Clarify orders / etc []  - 0 Routine Transfer to another Facility (non-emergent condition) []  - 0 Routine Hospital Admission (non-emergent condition) X- 1 15 New Admissions / Manufacturing engineer / Ordering NPWT Apligraf, etc. , []  - 0 Emergency Hospital Admission (emergent condition) PROCESS - Special Needs []  - 0 Pediatric  / Minor Patient Management []  - 0 Isolation Patient Management []  - 0 Hearing / Language / Visual special needs []  - 0 Assessment of Community assistance (transportation, D/C planning, etc.) []  - 0 Additional assistance / Altered mentation []  - 0 Support Surface(s) Assessment (bed,  cushion, seat, etc.) INTERVENTIONS - Miscellaneous []  - 0 External ear exam []  - 0 Patient Transfer (multiple staff / Nurse, adult / Similar devices) []  - 0 Simple Staple / Suture removal (25 or less) []  - 0 Complex Staple / Suture removal (26 or more) []  - 0 Hypo/Hyperglycemic Management (do not check if billed separately) []  - 0 Ankle / Brachial Index (ABI) - do not check if billed separately Has the patient been seen at the hospital within the last three years: Yes Total Score: 95 Level Of Care: New/Established - Level 3 Electronic Signature(s) Signed: 05/29/2023 1:37:42 PM By: Karie Schwalbe RN Entered By: Karie Schwalbe on 05/29/2023 13:35:37 Joshua [name] ([id number]) [id number] Page 3 of 9 -------------------------------------------------------------------------------- Encounter Discharge Information Details Patient Name: Date of Service: Joshua [name] 05/29/2023 8:00 [name] Medical Record Number: [id number] Patient Account Number: [id number] Date of Birth/Sex: Treating RN: Feb 27, 1951 (72 y.o. Dianna Limbo Primary Care Chryl Holten: Creola Corn Other Clinician: Referring Deantre Bourdon: Treating Mileidy Atkin/Extender: Jenene Slicker in Treatment: 0 Encounter Discharge Information Items Post Procedure Vitals Discharge Condition: Stable Temperature (F): 98.6 Ambulatory Status: Walker Pulse (bpm): 61 Discharge Destination: Home Respiratory Rate (breaths/min): 16 Transportation: Private Auto Blood Pressure (mmHg): 138/75 Accompanied By: self Schedule Follow-up Appointment: Yes Clinical Summary of Care: Patient Declined Electronic  Signature(s) Signed: 05/29/2023 1:37:42 PM By: Karie Schwalbe RN Entered By: Karie Schwalbe on 05/29/2023 13:37:09 -------------------------------------------------------------------------------- Lower Extremity Assessment Details Patient Name: Date of Service: Joshua [name]. 05/29/2023 8:00 [name] Medical Record Number: [id number] Patient Account Number: [id number] Date of Birth/Sex: Treating RN: [birth year] (72 y.o. Tammy Sours Primary Care Oluwademilade Kellett: Creola Corn Other Clinician: Referring Jamilah Jean: Treating Geniya Fulgham/Extender: Jenene Slicker in Treatment: 0 Edema Assessment Assessed: Kyra Searles: Yes] Franne Forts: No] Edema: [Left: Ye] [Right: s] Calf Left: Right: Point of Measurement: 48 cm From Medial Instep 38 cm Ankle Left: Right: Point of Measurement: 38 cm From Medial Instep 27 cm Knee To Floor Left: Right: From Medial Instep 48 cm Vascular Assessment Pulses: Dorsalis Pedis Palpable: [Left:Yes] Posterior Tibial Palpable: [Left:Yes] Electronic Signature(s) Signed: 05/29/2023 1:37:42 PM By: Karie Schwalbe RN Joshua [name], Joshua [name] ([id number]) [id number] Page 4 of 9 Signed: 05/29/2023 3:32:15 PM By: Shawn Stall RN, BSN Entered By: Karie Schwalbe on 05/29/2023 08:23:04 -------------------------------------------------------------------------------- Multi Wound Chart Details Patient Name: Date of Service: Joshua [name]. 05/29/2023 8:00 [name] Medical Record Number: [id number] Patient Account Number: [id number] Date of Birth/Sex: Treating RN: 05-15-1951 (72 y.o. M) Primary Care Josh Nicolosi: Creola Corn Other Clinician: Referring Elzora Cullins: Treating Chryl Holten/Extender: Jenene Slicker in Treatment: 0 Vital Signs Height(in): 76 Pulse(bpm): 61 Weight(lbs): 250 Blood Pressure(mmHg): 138/75 Body Mass Index(BMI): 30.4 Temperature(F): 98.6 Respiratory Rate(breaths/min): 16 [5:Photos:] [[name]:[name]] Left, Plantar  Foot [name] [name] Wound Location: Shear/Friction [name] [name] Wounding Event: Diabetic Wound/Ulcer of the Lower [name] [name] Primary Etiology: Extremity Cataracts, Hypertension, Type II [name] [name] Comorbid History: Diabetes, Neuropathy 05/08/2023 [name] [name] Date Acquired: 0 [name] [name] Weeks of Treatment: Open [name] [name] Wound Status: No [name] [name] Wound Recurrence: 0.9x0.5x0.1 [name] [name] Measurements L x W x D (cm) 0.353 [name] [name] [name] (cm) : rea 0.035 [name] [name] Volume (cm) : Grade 1 [name] [name] Classification: Medium [name] [name] Exudate [name] mount: Serosanguineous [name] [name] Exudate Type: red, brown [name] [name] Exudate Color: Distinct, outline attached [name] [name] Wound Margin: Large (67-100%) [name] [name] Granulation [name] mount: Red, Pink [name] [name] Granulation Quality: None Present (0%) [name] [name] Necrotic  [name] mount: Fat Layer (Subcutaneous Tissue): Yes [name] [name] Exposed Structures: Fascia: No Tendon: No Muscle: No Joint: No Bone: No Small (1-33%) [name] [name] Epithelialization: Debridement - Excisional [name] [name] Debridement: Pre-procedure Verification/Time Out 08:55 [name] [name] Taken: Lidocaine 5% topical ointment [name] [name] Pain Control: Callus, Subcutaneous, Slough [name] [name] Tissue Debrided: Skin/Subcutaneous Tissue [name] [name] Level: 0.35 [name] [name] Debridement [name] (sq cm): rea Curette [name] [name] Instrument: Minimum [name] [name] Bleeding: Pressure [name] [name] Hemostasis [name] chieved: 0 [name] [name] Procedural Pain: 0 [name] [name] Post Procedural Pain: Procedure was tolerated well [name] [name] Debridement Treatment Response: 0.9x0.5x0.1 [name] [name] Post Debridement Measurements L x Joshua [name], Joshua [name] ([id number]) [id number] Page 5 of 9 W x D (cm) 0.035 [name] [name] Post Debridement Volume: (cm) Callus: Yes [name] [name] Periwound Skin Texture: Excoriation: No Induration: No Crepitus: No Rash: No Scarring: No Maceration: No [name] [name] Periwound Skin Moisture: Dry/Scaly: No Atrophie Blanche: No [name] [name] Periwound Skin Color: Cyanosis:  No Ecchymosis: No Erythema: No Hemosiderin Staining: No Mottled: No Pallor: No Rubor: No Debridement [name] [name] Procedures Performed: Treatment Notes Electronic Signature(s) Signed: 05/29/2023 12:48:40 PM By: Geralyn Corwin DO Entered By: Geralyn Corwin on 05/29/2023 09:16:03 -------------------------------------------------------------------------------- Multi-Disciplinary Care Plan Details Patient Name: Date of Service: Joshua Theresa Duty [name]. 05/29/2023 8:00 [name] Medical Record Number: [id number] Patient Account Number: [id number] Date of Birth/Sex: Treating RN: November 17, 1951 (72 y.o. Dianna Limbo Primary Care Talisa Petrak: Creola Corn Other Clinician: Referring Berton Butrick: Treating Loyd Marhefka/Extender: Jenene Slicker in Treatment: 0 Active Inactive Wound/Skin Impairment Nursing Diagnoses: Impaired tissue integrity Goals: Patient/caregiver will verbalize understanding of skin care regimen Date Initiated: 05/29/2023 Target Resolution Date: 08/24/2023 Goal Status: Active Interventions: Assess ulceration(s) every visit Treatment Activities: Skin care regimen initiated : 05/29/2023 Notes: Electronic Signature(s) Signed: 05/29/2023 1:37:42 PM By: Karie Schwalbe RN Entered By: Karie Schwalbe on 05/29/2023 13:33:08 Joshua [name] ([id number]) [id number] Page 6 of 9 -------------------------------------------------------------------------------- Pain Assessment Details Patient Name: Date of Service: Joshua [name] 05/29/2023 8:00 [name] Medical Record Number: [id number] Patient Account Number: [id number] Date of Birth/Sex: Treating RN: 1951/03/20 (72 y.o. Tammy Sours Primary Care Kaytie Ratcliffe: Creola Corn Other Clinician: Referring Terriana Barreras: Treating Jayle Solarz/Extender: Jenene Slicker in Treatment: 0 Active Problems Location of Pain Severity and Description of Pain Patient Has Paino No Site Locations Rate  the pain. Current Pain Level: 0 Pain Management and Medication Current Pain Management: Medication: No Cold Application: No Rest: No Massage: No Activity: No T.E.N.S.: No Heat Application: No Leg drop or elevation: No Is the Current Pain Management Adequate: Adequate How does your wound impact your activities of daily livingo Sleep: No Bathing: No Appetite: No Relationship With Others: No Bladder Continence: No Emotions: No Bowel Continence: No Work: No Toileting: No Drive: No Dressing: No Hobbies: No Electronic Signature(s) Signed: 05/29/2023 1:37:42 PM By: Karie Schwalbe RN Signed: 05/29/2023 3:32:15 PM By: Shawn Stall RN, BSN Entered By: Karie Schwalbe on 05/29/2023 08:23:14 -------------------------------------------------------------------------------- Patient/Caregiver Education Details Patient Name: Date of Service: Joshua [name] 7/5/2024andnbsp8:00 [name] Medical Record Number: [id number] Patient Account Number: [id number] Date of Birth/Gender: Treating RN: [birth year] (71 y.o. Dianna Limbo Primary Care Physician: Creola Corn Other Clinician: Referring Physician: Treating Physician/Extender: Jenene Slicker in Treatment: 0 Education Assessment Education Provided To: Joshua [name], Joshua [name] ([id number]) [id number] Page 7 of 9 Patient Education Topics Provided Wound/Skin Impairment: Methods: Explain/Verbal Responses: Return demonstration correctly Electronic Signature(s) Signed: 05/29/2023 1:37:42 PM By: Karie Schwalbe  RN Entered By: Karie Schwalbe on 05/29/2023 13:33:17 -------------------------------------------------------------------------------- Wound Assessment Details Patient Name: Date of Service: Joshua [name] 05/29/2023 8:00 [name] Medical Record Number: [id number] Patient Account Number: [id number] Date of Birth/Sex: Treating RN: 1951-01-26 (72 y.o. Harlon Flor, Yvonne Kendall Primary Care Runa Whittingham:  Creola Corn Other Clinician: Referring Alayah Knouff: Treating Hrithik Boschee/Extender: Jenene Slicker in Treatment: 0 Wound Status Wound Number: 5 Primary Etiology: Diabetic Wound/Ulcer of the Lower Extremity Wound Location: Left, Plantar Foot Wound Status: Open Wounding Event: Shear/Friction Comorbid History: Cataracts, Hypertension, Type II Diabetes, Neuropathy Date Acquired: 05/08/2023 Weeks Of Treatment: 0 Clustered Wound: No Photos Wound Measurements Length: (cm) 0.9 Width: (cm) 0.5 Depth: (cm) 0.1 Area: (cm) 0.353 Volume: (cm) 0.035 % Reduction in Area: % Reduction in Volume: Epithelialization: Small (1-33%) Tunneling: No Undermining: No Wound Description Classification: Grade 1 Wound Margin: Distinct, outline attached Exudate Amount: Medium Exudate Type: Serosanguineous Exudate Color: red, brown Foul Odor After Cleansing: No Slough/Fibrino No Wound Bed Granulation Amount: Large (67-100%) Exposed Structure Granulation Quality: Red, Pink Fascia Exposed: No Necrotic Amount: None Present (0%) Fat Layer (Subcutaneous Tissue) Exposed: Yes Tendon Exposed: No Muscle Exposed: No Joint Exposed: No Bone Exposed: No Joshua [name], Joshua [name] ([id number]) [id number] Page 8 of 9 Periwound Skin Texture Texture Color No Abnormalities Noted: No No Abnormalities Noted: No Callus: Yes Atrophie Blanche: No Crepitus: No Cyanosis: No Excoriation: No Ecchymosis: No Induration: No Erythema: No Rash: No Hemosiderin Staining: No Scarring: No Mottled: No Pallor: No Moisture Rubor: No No Abnormalities Noted: No Dry / Scaly: No Maceration: No Treatment Notes Wound #5 (Foot) Wound Laterality: Plantar, Left Cleanser Soap and Water Discharge Instruction: May shower and wash wound with dial antibacterial soap and water prior to dressing change. Vashe 5.8 (oz) Discharge Instruction: Cleanse the wound with Vashe prior to applying [name] clean  dressing using gauze sponges, not tissue or cotton balls. Peri-Wound Care Topical Gentamicin Discharge Instruction: As directed by physician Mupirocin Ointment Discharge Instruction: Apply Mupirocin (Bactroban) as instructed Primary Dressing Hydrofera Blue Ready Transfer Foam, 2.5x2.5 (in/in) Discharge Instruction: Apply directly to wound bed as directed TCC #3 Secondary Dressing Woven Gauze Sponge, Non-Sterile 4x4 in Discharge Instruction: Apply over primary dressing as directed. Secured With Conforming Stretch Gauze Bandage Roll, Sterile 4x75 (in/in) Discharge Instruction: Secure with stretch gauze as directed. Transpore Surgical Tape, 2x10 (in/yd) Discharge Instruction: Secure dressing with tape as directed. Compression Wrap Compression Stockings Add-Ons Electronic Signature(s) Signed: 05/29/2023 1:37:42 PM By: Karie Schwalbe RN Signed: 05/29/2023 3:32:15 PM By: Shawn Stall RN, BSN Entered By: Karie Schwalbe on 05/29/2023 08:44:57 -------------------------------------------------------------------------------- Vitals Details Patient Name: Date of Service: Joshua [name]. 05/29/2023 8:00 [name] Medical Record Number: [id number] Patient Account Number: [id number] Date of Birth/Sex: Treating RN: 1951/08/31 (72 y.o. Dianna Limbo Primary Care Theo Krumholz: Creola Corn Other Clinician: Referring Sakiyah Shur: Treating Rya Rausch/Extender: Jenene Slicker in Treatment: 0 Joshua [name], Joshua [name] ([id number]) [id number] Page 9 of 9 Vital Signs Time Taken: 08:17 Temperature (F): 98.6 Height (in): 76 Pulse (bpm): 61 Weight (lbs): 250 Respiratory Rate (breaths/min): 16 Body Mass Index (BMI): 30.4 Blood Pressure (mmHg): 138/75 Reference Range: 80 - 120 mg / dl Electronic Signature(s) Signed: 05/29/2023 1:37:42 PM By: Karie Schwalbe RN Entered By: Karie Schwalbe on 05/29/2023 08:17:33

## 2023-06-01 NOTE — Progress Notes (Signed)
[name], Joshua [name] ([id number]) [id number] Page 1 of 10 Visit Report for 05/29/2023 Chief Complaint Document Details Patient Name: Date of Service: [name] 05/29/2023 8:00 [name] Medical Record Number: [id number] Patient Account Number: [id number] Date of Birth/Sex: Treating RN: Aug 03, 1951 (72 y.o. M) Primary Care Provider: Creola Corn Other Clinician: Referring Provider: Treating Provider/Extender: Jenene Slicker in Treatment: 0 Information Obtained from: Patient Chief Complaint 01/22/2023; left plantar diabetic foot ulcer 05/29/2023; left plantar diabetic foot ulcer Electronic Signature(s) Signed: 05/29/2023 12:48:40 PM By: Joshua Corwin DO Entered By: Joshua [name] on 05/29/2023 09:17:26 -------------------------------------------------------------------------------- Debridement Details Patient Name: Date of Service: Joshua [name]. 05/29/2023 8:00 [name] Medical Record Number: [id number] Patient Account Number: [id number] Date of Birth/Sex: Treating RN: 1951-10-29 (72 y.o. Joshua [name] Primary Care Provider: Creola Corn Other Clinician: Referring Provider: Treating Provider/Extender: Jenene Slicker in Treatment: 0 Debridement Performed for Assessment: Wound #5 Left,Plantar Foot Performed By: Physician Joshua Corwin, DO Debridement Type: Debridement Severity of Tissue Pre Debridement: Fat layer exposed Level of Consciousness (Pre-procedure): Awake and Alert Pre-procedure Verification/Time Out Yes - 08:55 Taken: Start Time: 08:55 Pain Control: Lidocaine 5% topical ointment Percent of Wound Bed Debrided: 100% T Area Debrided (cm): otal 0.35 Tissue and other material debrided: Viable, Non-Viable, Callus, Slough, Subcutaneous, Slough Level: Skin/Subcutaneous Tissue Debridement Description: Excisional Instrument: Curette Bleeding: Minimum Hemostasis Achieved: Pressure End Time:  08:57 Procedural Pain: 0 Post Procedural Pain: 0 Response to Treatment: Procedure was tolerated well Level of Consciousness (Post- Awake and Alert procedure): Post Debridement Measurements of Total Wound Length: (cm) 0.9 Width: (cm) 0.5 Depth: (cm) 0.1 Volume: (cm) 0.035 SHMAYA, [name] ([id number]) [id number] Page 2 of 10 Character of Wound/Ulcer Post Debridement: Improved Severity of Tissue Post Debridement: Fat layer exposed Post Procedure Diagnosis Same as Pre-procedure Notes Scribed for Dr. Mikey Bussing by J.Scotton Electronic Signature(s) Signed: 05/29/2023 12:48:40 PM By: Joshua Corwin DO Signed: 05/29/2023 1:37:42 PM By: Karie Schwalbe RN Entered By: Karie Schwalbe on 05/29/2023 08:59:48 -------------------------------------------------------------------------------- HPI Details Patient Name: Date of Service: Joshua [name]. 05/29/2023 8:00 [name] Medical Record Number: [id number] Patient Account Number: [id number] Date of Birth/Sex: Treating RN: 1950/12/14 (72 y.o. M) Primary Care Provider: Creola Corn Other Clinician: Referring Provider: Treating Provider/Extender: Jenene Slicker in Treatment: 0 History of Present Illness Location: left foot was the initial wound and then he has one on the right foot Quality: Patient reports No Pain. Severity: Patient states wound(s) are getting worse. Duration: Patient has had the wound for > 10 months prior to seeking treatment at the wound center Context: The wound would happen gradually Modifying Factors: Patient wound(s)/ulcer(s) are worsening due to : nonhealing nature in spite of various medications and procedures by the podiatrist Dr. Loreta Ave HPI Description: 01/22/2023 [name] is [name] With [name] past medical history of insulin-dependent type 2 diabetes with last hemoglobin A1c of 7.9 complicated by peripheral neuropathy, essential tremors, and OSA that  presents the clinic for [name] 3-56-month history of nonhealing ulcer to the plantar aspect of his left foot. He is not sure how it started but over time has progressively become larger. He is not using any offloading device. He has been placing [name] Band-Aid with antibiotic ointment to the wound bed. He has been following with Dr. Loreta Ave for this issue, podiatry. He last had an x-ray on 12/05/2022 that showed no definitive cortical destruction suggesting osteomyelitis. He currently denies  systemic signs of infection. 3/7; patient presents for follow-up. He has been using Hydrofera Blue to the wound bed. He has been using his surgical shoe with peg assist for offloading. He has been taking his oral antibiotics. He has no issues or complaints today. 3/14; patient presents for follow-up. He has been using Hydrofera Blue with Medihoney to the wound bed. He has been using his surgical shoe with peg assist for offloading. He has no issues or complaints today. 3/19; patient presents for follow-up. He has been using Hydrofera Blue and Medihoney to the wound bed. Plan is for the total contact cast today. He has no issues or complaints today. 3/21; patient presents for obligatory cast change. We have been using Hydrofera Blue and antibiotic ointment under the total contact cast. He tolerated the cast well and has no issues or complaints today. 3/26; patient presents for follow-up. We have been using Hydrofera Blue and antibiotic ointment under the total contact cast. There has been improvement in wound healing. 02/24/2023: The wound is very nearly healed; just [name] tiny narrow opening remains. 4/12; patient presents for follow-up. We have been using antibiotic ointment and Hydrofera Blue under the total contact cast. His wound is healed. He threw away his surgical shoe and peg assist earlier in the admission. 05/29/2023 [name] is [name] with [name] past medical history of insulin-dependent type 2 diabetes  complicated by peripheral neuropathy that presents the clinic for [name] wound to the left plantar foot. He has been treated in the clinic for the same issue last seen on 03/06/2023 with ultimate healing of the wound. He was treated with [facility] and the total contact cast and did well with this. He states that about 2 months ago the wound reopened. He has been following with podiatry for this issue. He has been using Silvadene to the wound bed. He denies signs of infection. Electronic Signature(s) Signed: 05/29/2023 12:48:40 PM By: Joshua Corwin DO Entered By: Joshua [name] on 05/29/2023 09:19:44 Joshua [name] ([id number]) [id number] Page 3 of 10 -------------------------------------------------------------------------------- Physical Exam Details Patient Name: Date of Service: [name] 05/29/2023 8:00 [name] Medical Record Number: [id number] Patient Account Number: [id number] Date of Birth/Sex: Treating RN: Mar 07, 1951 (72 y.o. M) Primary Care Provider: Creola Corn Other Clinician: Referring Provider: Treating Provider/Extender: Jenene Slicker in Treatment: 0 Constitutional respirations regular, non-labored and within target range for patient.. Cardiovascular 2+ dorsalis pedis/posterior tibialis pulses. Psychiatric pleasant and cooperative. Notes T the plantar aspect of the left foot to the first metatarsal there is an open wound with granulation tissue, nonviable tissue and callus. Postdebridement there o is healthy granulation tissue present. No signs of active infection including increased warmth, erythema or purulent drainage. Electronic Signature(s) Signed: 05/29/2023 12:48:40 PM By: Joshua Corwin DO Entered By: Joshua [name] on 05/29/2023 09:21:02 -------------------------------------------------------------------------------- Physician Orders Details Patient Name: Date of Service: Joshua [name].  05/29/2023 8:00 [name] Medical Record Number: [id number] Patient Account Number: [id number] Date of Birth/Sex: Treating RN: 1951/01/16 (72 y.o. Joshua [name] Primary Care Provider: Creola Corn Other Clinician: Referring Provider: Treating Provider/Extender: Jenene Slicker in Treatment: 0 Verbal / Phone Orders: No Diagnosis Coding Follow-up Appointments ppointment in: - Dr. Mikey Bussing Tuesday 11:30am TCC #3 Return [name] Other: - Dr. Mikey Bussing 06/09/23 at 8:15am TCC #3 Anesthetic (In clinic) Topical Lidocaine 5% applied to wound bed Bathing/ Shower/ Hygiene May shower with protection but do not get wound dressing(s) wet.  Protect dressing(s) with water repellant cover (for example, large plastic bag) or [name] cast cover and may then take shower. - Please do not get the T Contact cast wet on left leg. otal Edema Control - Lymphedema / SCD / Other Elevate legs to the level of the heart or above for 30 minutes daily and/or when sitting for 3-4 times [name] day throughout the day. Avoid standing for long periods of time. Off-Loading Total Contact Cast to Left Lower Extremity - Return 06/01/23 or 06/02/23 to check TCC. Other: - Elevate feet when sitting. Place [name] pillow behind the calf to off-load the feet. If so desired. Wound Treatment Wound #5 - Foot Wound Laterality: Plantar, Left Cleanser: Soap and Water 1 x Per Week/30 Days Discharge Instructions: May shower and wash wound with dial antibacterial soap and water prior to dressing change. CHRISTIOPHER, RAIOLA [name] ([id number]) [id number] Page 4 of 10 Cleanser: Vashe 5.8 (oz) 1 x Per Week/30 Days Discharge Instructions: Cleanse the wound with Vashe prior to applying [name] clean dressing using gauze sponges, not tissue or cotton balls. Topical: Gentamicin 1 x Per Week/30 Days Discharge Instructions: As directed by physician Topical: Mupirocin Ointment 1 x Per Week/30 Days Discharge Instructions: Apply Mupirocin  (Bactroban) as instructed Prim Dressing: Hydrofera Blue Ready Transfer Foam, 2.5x2.5 (in/in) 1 x Per Week/30 Days ary Discharge Instructions: Apply directly to wound bed as directed Prim Dressing: TCC #3 ary 1 x Per Week/30 Days Secondary Dressing: Woven Gauze Sponge, Non-Sterile 4x4 in 1 x Per Week/30 Days Discharge Instructions: Apply over primary dressing as directed. Secured With: Web designer, Sterile 4x75 (in/in) 1 x Per Week/30 Days Discharge Instructions: Secure with stretch gauze as directed. Secured With: Transpore Surgical Tape, 2x10 (in/yd) 1 x Per Week/30 Days Discharge Instructions: Secure dressing with tape as directed. Electronic Signature(s) Signed: 05/29/2023 12:48:40 PM By: Joshua Corwin DO Entered By: Joshua [name] on 05/29/2023 09:21:16 -------------------------------------------------------------------------------- Problem List Details Patient Name: Date of Service: Joshua [name]. 05/29/2023 8:00 [name] Medical Record Number: [id number] Patient Account Number: [id number] Date of Birth/Sex: Treating RN: 22-Dec-1950 (72 y.o. M) Primary Care Provider: Creola Corn Other Clinician: Referring Provider: Treating Provider/Extender: Jenene Slicker in Treatment: 0 Active Problems ICD-10 Encounter Code Description Active Date MDM Diagnosis [phone] Non-pressure chronic ulcer of other part of left foot with fat layer exposed 05/29/2023 No Yes E11.621 Type 2 diabetes mellitus with foot ulcer 05/29/2023 No Yes Z96.652 Presence of left artificial knee joint 05/29/2023 No Yes Inactive Problems Resolved Problems Electronic Signature(s) Signed: 05/29/2023 12:48:40 PM By: Joshua Corwin DO Entered By: Joshua [name] on 05/29/2023 09:15:57 Joshua [name] ([id number]) [id number] Page 5 of 10 -------------------------------------------------------------------------------- Progress Note  Details Patient Name: Date of Service: [name] 05/29/2023 8:00 [name] Medical Record Number: [id number] Patient Account Number: [id number] Date of Birth/Sex: Treating RN: July 10, 1951 (72 y.o. M) Primary Care Provider: Creola Corn Other Clinician: Referring Provider: Treating Provider/Extender: Jenene Slicker in Treatment: 0 Subjective Chief Complaint Information obtained from Patient 01/22/2023; left plantar diabetic foot ulcer 05/29/2023; left plantar diabetic foot ulcer History of Present Illness (HPI) The following HPI elements were documented for the patient's wound: Location: left foot was the initial wound and then he has one on the right foot Quality: Patient reports No Pain. Severity: Patient states wound(s) are getting worse. Duration: Patient has had the wound for > 10 months prior to seeking treatment at the wound center Context: The wound  would happen gradually Modifying Factors: Patient wound(s)/ulcer(s) are worsening due to : nonhealing nature in spite of various medications and procedures by the podiatrist Dr. Loreta Ave 01/22/2023 Mr. Jarreth Sidberry is [name] With [name] past medical history of insulin-dependent type 2 diabetes with last hemoglobin A1c of 7.9 complicated by peripheral neuropathy, essential tremors, and OSA that presents the clinic for [name] 3-73-month history of nonhealing ulcer to the plantar aspect of his left foot. He is not sure how it started but over time has progressively become larger. He is not using any offloading device. He has been placing [name] Band-Aid with antibiotic ointment to the wound bed. He has been following with Dr. Loreta Ave for this issue, podiatry. He last had an x-ray on 12/05/2022 that showed no definitive cortical destruction suggesting osteomyelitis. He currently denies systemic signs of infection. 3/7; patient presents for follow-up. He has been using Hydrofera Blue to the wound bed. He has been using his  surgical shoe with peg assist for offloading. He has been taking his oral antibiotics. He has no issues or complaints today. 3/14; patient presents for follow-up. He has been using Hydrofera Blue with Medihoney to the wound bed. He has been using his surgical shoe with peg assist for offloading. He has no issues or complaints today. 3/19; patient presents for follow-up. He has been using Hydrofera Blue and Medihoney to the wound bed. Plan is for the total contact cast today. He has no issues or complaints today. 3/21; patient presents for obligatory cast change. We have been using Hydrofera Blue and antibiotic ointment under the total contact cast. He tolerated the cast well and has no issues or complaints today. 3/26; patient presents for follow-up. We have been using Hydrofera Blue and antibiotic ointment under the total contact cast. There has been improvement in wound healing. 02/24/2023: The wound is very nearly healed; just [name] tiny narrow opening remains. 4/12; patient presents for follow-up. We have been using antibiotic ointment and Hydrofera Blue under the total contact cast. His wound is healed. He threw away his surgical shoe and peg assist earlier in the admission. 05/29/2023 Mr. Alamin Sprunk is [name] with [name] past medical history of insulin-dependent type 2 diabetes complicated by peripheral neuropathy that presents the clinic for [name] wound to the left plantar foot. He has been treated in the clinic for the same issue last seen on 03/06/2023 with ultimate healing of the wound. He was treated with [facility] and the total contact cast and did well with this. He states that about 2 months ago the wound reopened. He has been following with podiatry for this issue. He has been using Silvadene to the wound bed. He denies signs of infection. Patient History Information obtained from Patient. Allergies melatonin Family History Diabetes - Maternal Grandparents. Social  History Former smoker, Alcohol Use - Moderate - 2 drinks weekly, Drug Use - No History, Caffeine Use - Daily. Medical History Eyes Patient has history of Cataracts - extraction bilat Denies history of Glaucoma, Optic Neuritis Ear/Nose/Mouth/Throat Denies history of Chronic sinus problems/congestion, Middle ear problems Hematologic/Lymphatic Denies history of Anemia, Hemophilia, Human Immunodeficiency Virus, Lymphedema, Sickle Cell Disease [name], Joshua [name] ([id number]) [id number] Page 6 of 10 Respiratory Denies history of Aspiration, Asthma, Chronic Obstructive Pulmonary Disease (COPD), Pneumothorax, Sleep Apnea, Tuberculosis Cardiovascular Patient has history of Hypertension Denies history of Angina, Arrhythmia, Congestive Heart Failure, Coronary Artery Disease, Deep Vein Thrombosis, Hypotension, Myocardial Infarction, Peripheral Arterial Disease, Peripheral Venous Disease, Phlebitis, Vasculitis Gastrointestinal  Denies history of Cirrhosis , Colitis, Crohns, Hepatitis [name], Hepatitis B, Hepatitis C Endocrine Patient has history of Type II Diabetes Denies history of Type I Diabetes Genitourinary Denies history of End Stage Renal Disease Immunological Denies history of Lupus Erythematosus, Raynauds, Scleroderma Integumentary (Skin) Denies history of History of Burn Musculoskeletal Denies history of Gout, Rheumatoid Arthritis, Osteoarthritis, Osteomyelitis Neurologic Patient has history of Neuropathy Denies history of Dementia, Quadriplegia, Paraplegia, Seizure Disorder Oncologic Denies history of Received Chemotherapy, Received Radiation Psychiatric Denies history of Anorexia/bulimia, Confinement Anxiety Patient is treated with Insulin. Blood sugar is tested. Blood sugar results noted at the following times: Breakfast - 70. Hospitalization/Surgery History - appendectomy. - tonsillectomy. - vasectomy. - nasal septum surgery. Medical [name] Surgical History  Notes nd Constitutional Symptoms (General Health) arthro knee surgery Cardiovascular h/o CVA , Neurologic central tremors Oncologic h/o melanoma Review of Systems (ROS) Integumentary (Skin) Complains or has symptoms of Wounds - Ulcer on plantar Left foot (recurring). Objective Constitutional respirations regular, non-labored and within target range for patient.. Vitals Time Taken: 8:17 AM, Height: 76 in, Weight: 250 lbs, BMI: 30.4, Temperature: 98.6 F, Pulse: 61 bpm, Respiratory Rate: 16 breaths/min, Blood Pressure: 138/75 mmHg. Cardiovascular 2+ dorsalis pedis/posterior tibialis pulses. Psychiatric pleasant and cooperative. General Notes: T the plantar aspect of the left foot to the first metatarsal there is an open wound with granulation tissue, nonviable tissue and callus. o Postdebridement there is healthy granulation tissue present. No signs of active infection including increased warmth, erythema or purulent drainage. Integumentary (Hair, Skin) Wound #5 status is Open. Original cause of wound was Shear/Friction. The date acquired was: 05/08/2023. The wound is located on the Left,Plantar Foot. The wound measures 0.9cm length x 0.5cm width x 0.1cm depth; 0.353cm^2 area and 0.035cm^3 volume. There is Fat Layer (Subcutaneous Tissue) exposed. There is no tunneling or undermining noted. There is [name] medium amount of serosanguineous drainage noted. The wound margin is distinct with the outline attached to the wound base. There is large (67-100%) red, pink granulation within the wound bed. There is no necrotic tissue within the wound bed. The periwound skin appearance exhibited: Callus. The periwound skin appearance did not exhibit: Crepitus, Excoriation, Induration, Rash, Scarring, Dry/Scaly, Maceration, Atrophie Blanche, Cyanosis, Ecchymosis, Hemosiderin Staining, Mottled, Pallor, Rubor, Erythema. Assessment Active Problems ICD-10 Non-pressure chronic ulcer of other part of left foot  with fat layer exposed Type 2 diabetes mellitus with foot ulcer Joshua, DOREY [name] ([id number]) [id number] Page 7 of 10 Presence of left artificial knee joint Patient presents with recurrence of his left plantar diabetic foot ulcer. I debrided nonviable tissue. He has done well with the total contact cast and Hydrofera Blue in the past to heal the wound and we went ahead and proceeded with this treatment today. He will need custom orthotics in order for risk of recurrence to diminish after his wound heals. We also discussed the importance of glycemic control for his wound healing. Follow-up next week. Procedures Wound #5 Pre-procedure diagnosis of Wound #5 is [name] Diabetic Wound/Ulcer of the Lower Extremity located on the Left,Plantar Foot .Severity of Tissue Pre Debridement is: Fat layer exposed. There was [name] Excisional Skin/Subcutaneous Tissue Debridement with [name] total area of 0.35 sq cm performed by Joshua Corwin, DO. With the following instrument(s): Curette to remove Viable and Non-Viable tissue/material. Material removed includes Callus, Subcutaneous Tissue, and Slough after achieving pain control using Lidocaine 5% topical ointment. No specimens were taken. [name] time out was conducted at 08:55, prior to the start of the procedure. [name]  Minimum amount of bleeding was controlled with Pressure. The procedure was tolerated well with [name] pain level of 0 throughout and [name] pain level of 0 following the procedure. Post Debridement Measurements: 0.9cm length x 0.5cm width x 0.1cm depth; 0.035cm^3 volume. Character of Wound/Ulcer Post Debridement is improved. Severity of Tissue Post Debridement is: Fat layer exposed. Post procedure Diagnosis Wound #5: Same as Pre-Procedure General Notes: Scribed for Dr. Mikey Bussing by J.Scotton. Plan Follow-up Appointments: Return Appointment in: - Dr. Mikey Bussing Tuesday 11:30am TCC #3 Other: - Dr. Mikey Bussing 06/09/23 at 8:15am TCC #3 Anesthetic: (In clinic)  Topical Lidocaine 5% applied to wound bed Bathing/ Shower/ Hygiene: May shower with protection but do not get wound dressing(s) wet. Protect dressing(s) with water repellant cover (for example, large plastic bag) or [name] cast cover and may then take shower. - Please do not get the T Contact cast wet on left leg. otal Edema Control - Lymphedema / SCD / Other: Elevate legs to the level of the heart or above for 30 minutes daily and/or when sitting for 3-4 times [name] day throughout the day. Avoid standing for long periods of time. Off-Loading: T Contact Cast to Left Lower Extremity - Return 06/01/23 or 06/02/23 to check TCC. otal Other: - Elevate feet when sitting. Place [name] pillow behind the calf to off-load the feet. If so desired. WOUND #5: - Foot Wound Laterality: Plantar, Left Cleanser: Soap and Water 1 x Per Week/30 Days Discharge Instructions: May shower and wash wound with dial antibacterial soap and water prior to dressing change. Cleanser: Vashe 5.8 (oz) 1 x Per Week/30 Days Discharge Instructions: Cleanse the wound with Vashe prior to applying [name] clean dressing using gauze sponges, not tissue or cotton balls. Topical: Gentamicin 1 x Per Week/30 Days Discharge Instructions: As directed by physician Topical: Mupirocin Ointment 1 x Per Week/30 Days Discharge Instructions: Apply Mupirocin (Bactroban) as instructed Prim Dressing: Hydrofera Blue Ready Transfer Foam, 2.5x2.5 (in/in) 1 x Per Week/30 Days ary Discharge Instructions: Apply directly to wound bed as directed Prim Dressing: TCC #3 1 x Per Week/30 Days ary Secondary Dressing: Woven Gauze Sponge, Non-Sterile 4x4 in 1 x Per Week/30 Days Discharge Instructions: Apply over primary dressing as directed. Secured With: Web designer, Sterile 4x75 (in/in) 1 x Per Week/30 Days Discharge Instructions: Secure with stretch gauze as directed. Secured With: Transpore Surgical T ape, 2x10 (in/yd) 1 x Per Week/30 Days Discharge  Instructions: Secure dressing with tape as directed. 1. In office sharp debridement 2. T contact cast placed in standard fashion otal 3. Hydrofera Blue and antibiotic ointment 4. Follow-up in 1 week Electronic Signature(s) Signed: 05/29/2023 12:48:40 PM By: Joshua Corwin DO Entered By: Joshua [name] on 05/29/2023 09:23:33 Joshua [name] ([id number]) [id number] Page 8 of 10 -------------------------------------------------------------------------------- HxROS Details Patient Name: Date of Service: [name] 05/29/2023 8:00 [name] Medical Record Number: [id number] Patient Account Number: [id number] Date of Birth/Sex: Treating RN: 01/06/1951 (72 y.o. Joshua [name] Primary Care Provider: Creola Corn Other Clinician: Referring Provider: Treating Provider/Extender: Jenene Slicker in Treatment: 0 Information Obtained From Patient Integumentary (Skin) Complaints and Symptoms: Positive for: Wounds - Ulcer on plantar Left foot (recurring) Medical History: Negative for: History of Burn Constitutional Symptoms (General Health) Medical History: Past Medical History Notes: arthro knee surgery Eyes Medical History: Positive for: Cataracts - extraction bilat Negative for: Glaucoma; Optic Neuritis Ear/Nose/Mouth/Throat Medical History: Negative for: Chronic sinus problems/congestion; Middle ear problems Hematologic/Lymphatic Medical History: Negative for: Anemia;  Hemophilia; Human Immunodeficiency Virus; Lymphedema; Sickle Cell Disease Respiratory Medical History: Negative for: Aspiration; Asthma; Chronic Obstructive Pulmonary Disease (COPD); Pneumothorax; Sleep Apnea; Tuberculosis Cardiovascular Medical History: Positive for: Hypertension Negative for: Angina; Arrhythmia; Congestive Heart Failure; Coronary Artery Disease; Deep Vein Thrombosis; Hypotension; Myocardial Infarction; Peripheral Arterial Disease; Peripheral  Venous Disease; Phlebitis; Vasculitis Past Medical History Notes: h/o CVA , Gastrointestinal Medical History: Negative for: Cirrhosis ; Colitis; Crohns; Hepatitis [name]; Hepatitis B; Hepatitis C Endocrine Medical History: Positive for: Type II Diabetes Negative for: Type I Diabetes Time with diabetes: since 2010 Treated with: Insulin Blood sugar tested every day: Yes Tested : daily Blood sugar testing results: Breakfast: 70 Genitourinary Medical History: Negative for: End Stage Renal Disease Immunological Joshua, [name] ([id number]) [id number] Page 9 of 10 Medical History: Negative for: Lupus Erythematosus; Raynauds; Scleroderma Musculoskeletal Medical History: Negative for: Gout; Rheumatoid Arthritis; Osteoarthritis; Osteomyelitis Neurologic Medical History: Positive for: Neuropathy Negative for: Dementia; Quadriplegia; Paraplegia; Seizure Disorder Past Medical History Notes: central tremors Oncologic Medical History: Negative for: Received Chemotherapy; Received Radiation Past Medical History Notes: h/o melanoma Psychiatric Medical History: Negative for: Anorexia/bulimia; Confinement Anxiety HBO Extended History Items Eyes: Cataracts Immunizations Pneumococcal Vaccine: Received Pneumococcal Vaccination: No Immunization Notes: unknown Implantable Devices No devices added Hospitalization / Surgery History Type of Hospitalization/Surgery appendectomy tonsillectomy vasectomy nasal septum surgery Family and Social History Diabetes: Yes - Maternal Grandparents; Former smoker; Alcohol Use: Moderate - 2 drinks weekly; Drug Use: No History; Caffeine Use: Daily; Financial Concerns: No; Food, Clothing or Shelter Needs: No; Support System Lacking: No; Transportation Concerns: No Electronic Signature(s) Signed: 05/29/2023 12:48:40 PM By: Joshua Corwin DO Signed: 05/29/2023 1:37:42 PM By: Karie Schwalbe RN Signed: 05/29/2023 3:32:15 PM By:  Shawn Stall RN, BSN Entered By: Karie Schwalbe on 05/29/2023 08:18:35 -------------------------------------------------------------------------------- SuperBill Details Patient Name: Date of Service: Joshua [name]. 05/29/2023 Medical Record Number: [id number] Patient Account Number: [id number] Date of Birth/Sex: Treating RN: 08-Apr-1951 (72 y.o. M) Primary Care Provider: Creola Corn Other Clinician: Referring Provider: Treating Provider/Extender: Jenene Slicker in Treatment: 0 [name], Joshua [name] ([id number]) [id number] Page 10 of 10 Diagnosis Coding ICD-10 Codes Code Description 2541302731 Non-pressure chronic ulcer of other part of left foot with fat layer exposed E11.621 Type 2 diabetes mellitus with foot ulcer Z96.652 Presence of left artificial knee joint Facility Procedures : CPT4 Code: 13244010 Description: 99213 - WOUND CARE VISIT-LEV 3 EST PT Modifier: Quantity: 1 : CPT4 Code: 27253664 Description: 11042 - DEB SUBQ TISSUE 20 SQ CM/< ICD-10 Diagnosis Description L97.522 Non-pressure chronic ulcer of other part of left foot with fat layer exposed E11.621 Type 2 diabetes mellitus with foot ulcer Z96.652 Presence of left artificial knee  joint Modifier: Quantity: 1 Physician Procedures : CPT4 Code Description Modifier [id number] 99213 - WC PHYS LEVEL 3 - EST PT 25 ICD-10 Diagnosis Description L97.522 Non-pressure chronic ulcer of other part of left foot with fat layer exposed E11.621 Type 2 diabetes mellitus with foot ulcer Z96.652  Presence of left artificial knee joint Quantity: 1 : [id number] 11042 - WC PHYS SUBQ TISS 20 SQ CM ICD-10 Diagnosis Description L97.522 Non-pressure chronic ulcer of other part of left foot with fat layer exposed E11.621 Type 2 diabetes mellitus with foot ulcer Z96.652 Presence of left artificial knee joint Quantity: 1 Electronic Signature(s) Signed: 05/29/2023 1:37:42 PM By: Karie Schwalbe  RN Signed: 06/01/2023 12:25:26 PM By: Joshua Corwin DO Previous Signature: 05/29/2023 12:48:40 PM Version By: Joshua Corwin DO Entered By: Karie Schwalbe on 05/29/2023 13:35:47

## 2023-06-02 ENCOUNTER — Encounter (HOSPITAL_BASED_OUTPATIENT_CLINIC_OR_DEPARTMENT_OTHER): Payer: Medicare PPO | Admitting: Internal Medicine

## 2023-06-02 DIAGNOSIS — L97522 Non-pressure chronic ulcer of other part of left foot with fat layer exposed: Secondary | ICD-10-CM

## 2023-06-02 DIAGNOSIS — I10 Essential (primary) hypertension: Secondary | ICD-10-CM | POA: Diagnosis not present

## 2023-06-02 DIAGNOSIS — E11621 Type 2 diabetes mellitus with foot ulcer: Secondary | ICD-10-CM | POA: Diagnosis not present

## 2023-06-02 DIAGNOSIS — Z794 Long term (current) use of insulin: Secondary | ICD-10-CM | POA: Diagnosis not present

## 2023-06-02 DIAGNOSIS — E1142 Type 2 diabetes mellitus with diabetic polyneuropathy: Secondary | ICD-10-CM | POA: Diagnosis not present

## 2023-06-02 DIAGNOSIS — G4733 Obstructive sleep apnea (adult) (pediatric): Secondary | ICD-10-CM | POA: Diagnosis not present

## 2023-06-02 DIAGNOSIS — E1151 Type 2 diabetes mellitus with diabetic peripheral angiopathy without gangrene: Secondary | ICD-10-CM | POA: Diagnosis not present

## 2023-06-02 NOTE — Progress Notes (Addendum)
Joshua Joshua, Joshua Joshua ([id number]) [id number] Page 1 of 7 Visit Report for 06/02/2023 Arrival Information Details Patient Name: Date of Service: [name] 06/02/2023 11:30 Joshua Joshua Medical Record Number: [id number] Patient Account Number: [id number] Date of Birth/Sex: Treating RN: 1951-10-19 (72 y.o. Joshua Joshua Primary Care Joshua Joshua: Joshua Joshua Other Clinician: Referring Joshua Joshua: Treating Joshua Joshua/Extender: Joshua Joshua in Treatment: 0 Visit Information History Since Last Visit Added or deleted any medications: No Patient Arrived: Joshua Joshua Any new allergies or adverse reactions: No Arrival Time: 11:19 Had Joshua fall or experienced change in No Accompanied By: wife activities of daily living that may affect Transfer Assistance: None risk of falls: Patient Identification Verified: Yes Signs or symptoms of abuse/neglect since last visito No Secondary Verification Process Completed: Yes Hospitalized since last visit: No Patient Has Alerts: Yes Implantable device outside of the clinic excluding No Patient Alerts: ABI R 1.24 (05/22/23) cellular tissue based products placed in the center TBI R 0.81 (05/22/23) since last visit: ABI L [place] (05/22/23) Has Dressing in Place as Prescribed: Yes TBI L 1.05 (05/22/23) Has Footwear/Offloading in Place as Prescribed: Yes ELIQUIS Left: T Contact Cast otal Pain Present Now: No Electronic Signature(s) Signed: 06/02/2023 12:14:01 PM By: Joshua Pulling RN, BSN Entered By: Joshua Joshua on 06/02/2023 11:20:14 -------------------------------------------------------------------------------- Encounter Discharge Information Details Patient Name: Date of Service: Joshua Duel Joshua. 06/02/2023 11:30 Joshua Joshua Medical Record Number: [id number] Patient Account Number: [id number] Date of Birth/Sex: Treating RN: 04-23-1951 (72 y.o. Joshua Joshua Primary Care Lujuana Kapler: Joshua Joshua Other Clinician: Referring  Joshua Joshua: Treating Joshua Joshua/Extender: Joshua Joshua in Treatment: 0 Encounter Discharge Information Items Discharge Condition: Stable Ambulatory Status: Walker Discharge Destination: Home Transportation: Private Auto Schedule Follow-up Appointment: Yes Clinical Summary of Care: Patient Declined Electronic Signature(s) Signed: 06/02/2023 12:14:01 PM By: Joshua Pulling RN, BSN Entered By: Joshua Joshua on 06/02/2023 12:12:29 Joshua Joshua ([id number]) [id number] Page 2 of 7 -------------------------------------------------------------------------------- Lower Extremity Assessment Details Patient Name: Date of Service: [name] 06/02/2023 11:30 Joshua Joshua Medical Record Number: [id number] Patient Account Number: [id number] Date of Birth/Sex: Treating RN: 1951-04-04 (72 y.o. Joshua Joshua Primary Care Dane Bloch: Joshua Joshua Other Clinician: Referring Maciah Feeback: Treating Javari Bufkin/Extender: Joshua Joshua in Treatment: 0 Edema Assessment Assessed: Kyra Searles: No] [Right: No] Edema: [Left: Ye] [Right: s] Calf Left: Right: Point of Measurement: 48 cm From Medial Instep 39.5 cm Ankle Left: Right: Point of Measurement: 38 cm From Medial Instep 25.2 cm Vascular Assessment Pulses: Dorsalis Pedis Palpable: [Left:Yes] Electronic Signature(s) Signed: 06/02/2023 12:14:01 PM By: Joshua Pulling RN, BSN Entered By: Joshua Joshua on 06/02/2023 11:29:47 -------------------------------------------------------------------------------- Multi Wound Chart Details Patient Name: Date of Service: Joshua Duel Joshua. 06/02/2023 11:30 Joshua Joshua Medical Record Number: [id number] Patient Account Number: [id number] Date of Birth/Sex: Treating RN: [birth year] (72 y.o. [name]) Primary Care Rishit Burkhalter: Joshua Joshua Other Clinician: Referring Jailynne Opperman: Treating Jeryl Wilbourn/Extender: Joshua Joshua in Treatment: 0 Vital  Signs Height(in): 76 Pulse(bpm): 73 Weight(lbs): 250 Blood Pressure(mmHg): 150/76 Body Mass Index(BMI): 30.4 Temperature(F): 98.0 Respiratory Rate(breaths/min): 18 [5:Photos:] [N/Joshua:N/Joshua] Left, Plantar Foot N/Joshua N/Joshua Wound Location: Shear/Friction N/Joshua N/Joshua Wounding Event: Diabetic Wound/Ulcer of the Lower N/Joshua N/Joshua Primary Etiology: Extremity Cataracts, Hypertension, Type II N/Joshua N/Joshua Comorbid History: Diabetes, Neuropathy 05/08/2023 N/Joshua N/Joshua Date Acquired: 0 N/Joshua N/Joshua Weeks of Treatment: Open N/Joshua N/Joshua Wound Status: No N/Joshua N/Joshua Wound Recurrence: 0.6x0.4x0.1 N/Joshua N/Joshua Measurements L x W x D (cm) 0.188  N/Joshua N/Joshua Joshua (cm) : rea 0.019 N/Joshua N/Joshua Volume (cm) : 46.70% N/Joshua N/Joshua % Reduction in Joshua rea: 45.70% N/Joshua N/Joshua % Reduction in Volume: Grade 1 N/Joshua N/Joshua Classification: Medium N/Joshua N/Joshua Exudate Joshua mount: Serosanguineous N/Joshua N/Joshua Exudate Type: red, brown N/Joshua N/Joshua Exudate Color: Distinct, outline attached N/Joshua N/Joshua Wound Margin: Large (67-100%) N/Joshua N/Joshua Granulation Joshua mount: Red, Pink N/Joshua N/Joshua Granulation Quality: Small (1-33%) N/Joshua N/Joshua Necrotic Joshua mount: Fat Layer (Subcutaneous Tissue): Yes N/Joshua N/Joshua Exposed Structures: Fascia: No Tendon: No Muscle: No Joint: No Bone: No Small (1-33%) N/Joshua N/Joshua Epithelialization: Callus: Yes N/Joshua N/Joshua Periwound Skin Texture: Excoriation: No Induration: No Crepitus: No Rash: No Scarring: No Maceration: No N/Joshua N/Joshua Periwound Skin Moisture: Dry/Scaly: No Atrophie Blanche: No N/Joshua N/Joshua Periwound Skin Color: Cyanosis: No Ecchymosis: No Erythema: No Hemosiderin Staining: No Mottled: No Pallor: No Rubor: No T Contact Cast otal N/Joshua N/Joshua Procedures Performed: Treatment Notes Wound #5 (Foot) Wound Laterality: Plantar, Left Cleanser Soap and Water Discharge Instruction: May shower and wash wound with dial antibacterial soap and water prior to dressing change. Vashe 5.8 (oz) Discharge Instruction: Cleanse the wound with Vashe prior to applying Joshua clean dressing  using gauze sponges, not tissue or cotton balls. Peri-Wound Care Topical Gentamicin Discharge Instruction: As directed by physician Mupirocin Ointment Discharge Instruction: Apply Mupirocin (Bactroban) as instructed Primary Dressing Hydrofera Blue Ready Transfer Foam, 2.5x2.5 (in/in) Discharge Instruction: Apply directly to wound bed as directed TCC #3 Secondary Dressing Woven Gauze Sponge, Non-Sterile 4x4 in Discharge Instruction: Apply over primary dressing as directed. Secured With Conforming Stretch Gauze Bandage Roll, Sterile 4x75 (in/in) Discharge Instruction: Secure with stretch gauze as directed. Transpore Surgical Tape, 2x10 (in/yd) Discharge Instruction: Secure dressing with tape as directed. IVON, OELKERS Joshua ([id number]) [id number] Page 4 of 7 Compression Wrap Compression Stockings Add-Ons Electronic Signature(s) Signed: 06/02/2023 2:56:31 PM By: Geralyn Corwin DO Entered By: Geralyn Corwin on 06/02/2023 13:45:28 -------------------------------------------------------------------------------- Multi-Disciplinary Care Plan Details Patient Name: Date of Service: Joshua Duel Joshua. 06/02/2023 11:30 Joshua Joshua Medical Record Number: [id number] Patient Account Number: [id number] Date of Birth/Sex: Treating RN: 07-15-1951 (72 y.o. Joshua Joshua Primary Care Dynisha Due: Joshua Joshua Other Clinician: Referring Marcayla Budge: Treating Kobi Mario/Extender: Joshua Joshua in Treatment: 0 Active Inactive Wound/Skin Impairment Nursing Diagnoses: Impaired tissue integrity Goals: Patient/caregiver will verbalize understanding of skin care regimen Date Initiated: 05/29/2023 Target Resolution Date: 08/24/2023 Goal Status: Active Interventions: Assess ulceration(s) every visit Treatment Activities: Skin care regimen initiated : 05/29/2023 Notes: Electronic Signature(s) Signed: 06/02/2023 12:14:01 PM By: Joshua Pulling RN, BSN Entered By: Joshua Joshua on 06/02/2023 12:11:37 -------------------------------------------------------------------------------- Pain Assessment Details Patient Name: Date of Service: Joshua Duel Joshua. 06/02/2023 11:30 Joshua Joshua Medical Record Number: [id number] Patient Account Number: [id number] Date of Birth/Sex: Treating RN: 09/09/1951 (72 y.o. Joshua Joshua Primary Care Rayana Geurin: Joshua Joshua Other Clinician: Referring Holleigh Crihfield: Treating Jenisis Harmsen/Extender: Joshua Joshua in Treatment: 0 Active Problems Location of Pain Severity and Description of Pain DUSTEN, ELLINWOOD Joshua ([id number]) [id number] Page 5 of 7 Patient Has Paino No Site Locations Pain Management and Medication Current Pain Management: Electronic Signature(s) Signed: 06/02/2023 12:14:01 PM By: Joshua Pulling RN, BSN Entered By: Joshua Joshua on 06/02/2023 11:20:39 -------------------------------------------------------------------------------- Patient/Caregiver Education Details Patient Name: Date of Service: [name] 7/9/2024andnbsp11:30 Joshua Joshua Medical Record Number: [id number] Patient Account Number: [id number] Date of Birth/Gender: Treating RN: 21-Oct-1951 (72 y.o. Joshua Joshua Primary Care Physician: Joshua Joshua Other Clinician: Referring  Physician: Treating Physician/Extender: Joshua Joshua in Treatment: 0 Education Assessment Education Provided To: Patient Education Topics Provided Wound/Skin Impairment: Methods: Explain/Verbal Responses: State content correctly Electronic Signature(s) Signed: 06/02/2023 12:14:01 PM By: Joshua Pulling RN, BSN Entered By: Joshua Joshua on 06/02/2023 12:11:54 -------------------------------------------------------------------------------- Wound Assessment Details Patient Name: Date of Service: Joshua Duel Joshua. 06/02/2023 11:30 Joshua Joshua Medical Record Number: [id number] Patient Account Number: [id number] Date of  Birth/Sex: Treating RN: [birth year] (72 y.o. Claudia Desanctis, Governor Specking, Ulice Brilliant Joshua ([id number]) [id number] Page 6 of 7 Primary Care Indio Santilli: Joshua Joshua Other Clinician: Referring Kegan Mckeithan: Treating Serrita Lueth/Extender: Joshua Joshua in Treatment: 0 Wound Status Wound Number: 5 Primary Etiology: Diabetic Wound/Ulcer of the Lower Extremity Wound Location: Left, Plantar Foot Wound Status: Open Wounding Event: Shear/Friction Comorbid History: Cataracts, Hypertension, Type II Diabetes, Neuropathy Date Acquired: 05/08/2023 Weeks Of Treatment: 0 Clustered Wound: No Photos Wound Measurements Length: (cm) 0.6 Width: (cm) 0.4 Depth: (cm) 0.1 Area: (cm) 0.188 Volume: (cm) 0.019 % Reduction in Area: 46.7% % Reduction in Volume: 45.7% Epithelialization: Small (1-33%) Tunneling: No Undermining: No Wound Description Classification: Grade 1 Wound Margin: Distinct, outline attached Exudate Amount: Medium Exudate Type: Serosanguineous Exudate Color: red, brown Foul Odor After Cleansing: No Slough/Fibrino Yes Wound Bed Granulation Amount: Large (67-100%) Exposed Structure Granulation Quality: Red, Pink Fascia Exposed: No Necrotic Amount: Small (1-33%) Fat Layer (Subcutaneous Tissue) Exposed: Yes Necrotic Quality: Adherent Slough Tendon Exposed: No Muscle Exposed: No Joint Exposed: No Bone Exposed: No Periwound Skin Texture Texture Color No Abnormalities Noted: No No Abnormalities Noted: No Callus: Yes Atrophie Blanche: No Crepitus: No Cyanosis: No Excoriation: No Ecchymosis: No Induration: No Erythema: No Rash: No Hemosiderin Staining: No Scarring: No Mottled: No Pallor: No Moisture Rubor: No No Abnormalities Noted: No Dry / Scaly: No Maceration: No Treatment Notes Wound #5 (Foot) Wound Laterality: Plantar, Left Cleanser Soap and Water Discharge Instruction: May shower and wash wound with dial antibacterial soap and  water prior to dressing change. Vashe 5.8 (oz) Discharge Instruction: Cleanse the wound with Vashe prior to applying Joshua clean dressing using gauze sponges, not tissue or cotton balls. BODIN, GORKA Joshua ([id number]) [id number] Page 7 of 7 Peri-Wound Care Topical Gentamicin Discharge Instruction: As directed by physician Mupirocin Ointment Discharge Instruction: Apply Mupirocin (Bactroban) as instructed Primary Dressing Hydrofera Blue Ready Transfer Foam, 2.5x2.5 (in/in) Discharge Instruction: Apply directly to wound bed as directed TCC #3 Secondary Dressing Woven Gauze Sponge, Non-Sterile 4x4 in Discharge Instruction: Apply over primary dressing as directed. Secured With Conforming Stretch Gauze Bandage Roll, Sterile 4x75 (in/in) Discharge Instruction: Secure with stretch gauze as directed. Transpore Surgical Tape, 2x10 (in/yd) Discharge Instruction: Secure dressing with tape as directed. Compression Wrap Compression Stockings Add-Ons Electronic Signature(s) Signed: 06/02/2023 12:14:01 PM By: Joshua Pulling RN, BSN Entered By: Joshua Joshua on 06/02/2023 11:34:17 -------------------------------------------------------------------------------- Vitals Details Patient Name: Date of Service: Joshua Duel Joshua. 06/02/2023 11:30 Joshua Joshua Medical Record Number: [id number] Patient Account Number: [id number] Date of Birth/Sex: Treating RN: [birth year] (72 y.o. Joshua Joshua Primary Care Wladyslawa Disbro: Joshua Joshua Other Clinician: Referring Camille Dragan: Treating Amenda Duclos/Extender: Joshua Joshua in Treatment: 0 Vital Signs Time Taken: 11:20 Temperature (F): 98.0 Height (in): 76 Pulse (bpm): 73 Weight (lbs): 250 Respiratory Rate (breaths/min): 18 Body Mass Index (BMI): 30.4 Blood Pressure (mmHg): 150/76 Reference Range: 80 - 120 mg / dl Electronic Signature(s) Signed: 06/02/2023 12:14:01 PM By: Joshua Pulling RN, BSN Entered By: Joshua Joshua on  06/02/2023 11:20:30

## 2023-06-02 NOTE — Progress Notes (Signed)
SAIVON, PROWSE [name] ([id number]) [id number] Page 1 of 10 Visit Report for 06/02/2023 Chief Complaint Document Details Patient Name: Date of Service: Joshua [name] 06/02/2023 11:30 [name] Medical Record Number: [id number] Patient Account Number: [id number] Date of Birth/Sex: Treating RN: 12-29-1950 (72 y.o. M) Primary Care Provider: Creola Corn Other Clinician: Referring Provider: Treating Provider/Extender: Octavia Bruckner in Treatment: 0 Information Obtained from: Patient Chief Complaint 01/22/2023; left plantar diabetic foot ulcer 05/29/2023; left plantar diabetic foot ulcer Electronic Signature(s) Signed: 06/02/2023 2:56:31 PM By: Geralyn Corwin DO Entered By: Geralyn Corwin on 06/02/2023 13:45:41 -------------------------------------------------------------------------------- HPI Details Patient Name: Date of Service: Joshua [name]. 06/02/2023 11:30 [name] Medical Record Number: [id number] Patient Account Number: [id number] Date of Birth/Sex: Treating RN: 08/13/1951 (72 y.o. M) Primary Care Provider: Creola Corn Other Clinician: Referring Provider: Treating Provider/Extender: Octavia Bruckner in Treatment: 0 History of Present Illness Location: left foot was the initial wound and then he has one on the right foot Quality: Patient reports No Pain. Severity: Patient states wound(s) are getting worse. Duration: Patient has had the wound for > 10 months prior to seeking treatment at the wound center Context: The wound would happen gradually Modifying Factors: Patient wound(s)/ulcer(s) are worsening due to : nonhealing nature in spite of various medications and procedures by the podiatrist Dr. Loreta Ave HPI Description: 01/22/2023 Joshua [name] is [name] With [name] past medical history of insulin-dependent type 2 diabetes with last hemoglobin A1c of 7.9 complicated by peripheral neuropathy, essential tremors,  and OSA that presents the clinic for [name] 3-39-month history of nonhealing ulcer to the plantar aspect of his left foot. He is not sure how it started but over time has progressively become larger. He is not using any offloading device. He has been placing [name] Band-Aid with antibiotic ointment to the wound bed. He has been following with Dr. Loreta Ave for this issue, podiatry. He last had an x-ray on 12/05/2022 that showed no definitive cortical destruction suggesting osteomyelitis. He currently denies systemic signs of infection. 3/7; patient presents for follow-up. He has been using Hydrofera Blue to the wound bed. He has been using his surgical shoe with peg assist for offloading. He has been taking his oral antibiotics. He has no issues or complaints today. 3/14; patient presents for follow-up. He has been using Hydrofera Blue with Medihoney to the wound bed. He has been using his surgical shoe with peg assist for offloading. He has no issues or complaints today. 3/19; patient presents for follow-up. He has been using Hydrofera Blue and Medihoney to the wound bed. Plan is for the total contact cast today. He has no issues or complaints today. 3/21; patient presents for obligatory cast change. We have been using Hydrofera Blue and antibiotic ointment under the total contact cast. He tolerated the cast well and has no issues or complaints today. 3/26; patient presents for follow-up. We have been using Hydrofera Blue and antibiotic ointment under the total contact cast. There has been improvement in wound healing. 02/24/2023: The wound is very nearly healed; just [name] tiny narrow opening remains. Joshua [name], Joshua [name] [name] ([id number]) [id number] Page 2 of 10 4/12; patient presents for follow-up. We have been using antibiotic ointment and Hydrofera Blue under the total contact cast. His wound is healed. He threw away his surgical shoe and peg assist earlier in the admission. 05/29/2023 [name] is [name] with [name] past medical history of  insulin-dependent type 2 diabetes complicated by peripheral neuropathy that presents the clinic for [name] wound to the left plantar foot. He has been treated in the clinic for the same issue last seen on 03/06/2023 with ultimate healing of the wound. He was treated with [facility] and the total contact cast and did well with this. He states that about 2 months ago the wound reopened. He has been following with podiatry for this issue. He has been using Silvadene to the wound bed. He denies signs of infection. 7/9; patient presents for follow-up. We have been using antibiotic ointment with Hydrofera Blue under the total contact cast. Wound is smaller. He has no issues or complaints today. Electronic Signature(s) Signed: 06/02/2023 2:56:31 PM By: Geralyn Corwin DO Entered By: Geralyn Corwin on 06/02/2023 13:50:39 -------------------------------------------------------------------------------- Physical Exam Details Patient Name: Date of Service: Joshua Joshua [name]. 06/02/2023 11:30 [name] Medical Record Number: [id number] Patient Account Number: [id number] Date of Birth/Sex: Treating RN: 01-Feb-1951 (72 y.o. M) Primary Care Provider: Creola Corn Other Clinician: Referring Provider: Treating Provider/Extender: Octavia Bruckner in Treatment: 0 Constitutional respirations regular, non-labored and within target range for patient.. Cardiovascular 2+ dorsalis pedis/posterior tibialis pulses. Psychiatric pleasant and cooperative. Notes T the plantar aspect of the left foot to the first metatarsal there is an open wound with granulation tissue. No signs of active infection including increased o warmth, erythema or purulent drainage. Electronic Signature(s) Signed: 06/02/2023 2:56:31 PM By: Geralyn Corwin DO Entered By: Geralyn Corwin on 06/02/2023  13:51:09 -------------------------------------------------------------------------------- Physician Orders Details Patient Name: Date of Service: Joshua [name]. 06/02/2023 11:30 [name] Medical Record Number: [id number] Patient Account Number: [id number] Date of Birth/Sex: Treating RN: [birth year] (72 y.o. Cline Cools Primary Care Provider: Creola Corn Other Clinician: Referring Provider: Treating Provider/Extender: Octavia Bruckner in Treatment: 0 Verbal / Phone Orders: No Diagnosis Coding Follow-up Appointments ppointment in 1 week. - Dr. Mikey Bussing 06/09/23 at 8:15am TCC #3 Return [name] Anesthetic (In clinic) Topical Lidocaine 5% applied to wound bed Joshua [name], Joshua [name] ([id number]) [phone].pdf Page 3 of 10 Bathing/ Shower/ Hygiene May shower with protection but do not get wound dressing(s) wet. Protect dressing(s) with water repellant cover (for example, large plastic bag) or [name] cast cover and may then take shower. - Please do not get the T Contact cast wet on left leg. otal Edema Control - Lymphedema / SCD / Other Elevate legs to the level of the heart or above for 30 minutes daily and/or when sitting for 3-4 times [name] day throughout the day. Avoid standing for long periods of time. Off-Loading Total Contact Cast to Left Lower Extremity - Return 06/01/23 or 06/02/23 to check TCC. Other: - Elevate feet when sitting. Place [name] pillow behind the calf to off-load the feet. If so desired. Wound Treatment Wound #5 - Foot Wound Laterality: Plantar, Left Cleanser: Soap and Water 1 x Per Week/30 Days Discharge Instructions: May shower and wash wound with dial antibacterial soap and water prior to dressing change. Cleanser: Vashe 5.8 (oz) 1 x Per Week/30 Days Discharge Instructions: Cleanse the wound with Vashe prior to applying [name] clean dressing using gauze sponges, not tissue or cotton balls. Topical: Gentamicin 1 x Per Week/30 Days Discharge  Instructions: As directed by physician Topical: Mupirocin Ointment 1 x Per Week/30 Days Discharge Instructions: Apply Mupirocin (Bactroban) as instructed Prim Dressing: Hydrofera Blue Ready Transfer Foam, 2.5x2.5 (in/in) 1 x Per Week/30 Days ary Discharge Instructions: Apply directly to  wound bed as directed Prim Dressing: TCC #3 ary 1 x Per Week/30 Days Secondary Dressing: Woven Gauze Sponge, Non-Sterile 4x4 in 1 x Per Week/30 Days Discharge Instructions: Apply over primary dressing as directed. Secured With: Web designer, Sterile 4x75 (in/in) 1 x Per Week/30 Days Discharge Instructions: Secure with stretch gauze as directed. Secured With: Transpore Surgical Tape, 2x10 (in/yd) 1 x Per Week/30 Days Discharge Instructions: Secure dressing with tape as directed. Patient Medications llergies: melatonin [name] Notifications Medication Indication Start End 06/02/2023 lidocaine DOSE topical 4 % cream - cream topical once daily Electronic Signature(s) Signed: 06/02/2023 2:56:31 PM By: Geralyn Corwin DO Previous Signature: 06/02/2023 12:14:01 PM Version By: Redmond Pulling RN, BSN Entered By: Geralyn Corwin on 06/02/2023 13:51:20 -------------------------------------------------------------------------------- Problem List Details Patient Name: Date of Service: Joshua [name]. 06/02/2023 11:30 [name] Medical Record Number: [id number] Patient Account Number: [id number] Date of Birth/Sex: Treating RN: 07/11/1951 (72 y.o. M) Primary Care Provider: Creola Corn Other Clinician: Referring Provider: Treating Provider/Extender: Octavia Bruckner in Treatment: [address] [place], ORVIS ([id number]) [id number] Page 4 of 10 ICD-10 Encounter Code Description Active Date MDM Diagnosis L97.522 Non-pressure chronic ulcer of other part of left foot with fat layer exposed 05/29/2023 No Yes E11.621 Type 2 diabetes mellitus with foot  ulcer 05/29/2023 No Yes Z96.652 Presence of left artificial knee joint 05/29/2023 No Yes Inactive Problems Resolved Problems Electronic Signature(s) Signed: 06/02/2023 2:56:31 PM By: Geralyn Corwin DO Entered By: Geralyn Corwin on 06/02/2023 13:45:22 -------------------------------------------------------------------------------- Progress Note Details Patient Name: Date of Service: Joshua [name]. 06/02/2023 11:30 [name] Medical Record Number: [id number] Patient Account Number: [id number] Date of Birth/Sex: Treating RN: Apr 23, 1951 (72 y.o. M) Primary Care Provider: Creola Corn Other Clinician: Referring Provider: Treating Provider/Extender: Octavia Bruckner in Treatment: 0 Subjective Chief Complaint Information obtained from Patient 01/22/2023; left plantar diabetic foot ulcer 05/29/2023; left plantar diabetic foot ulcer History of Present Illness (HPI) The following HPI elements were documented for the patient's wound: Location: left foot was the initial wound and then he has one on the right foot Quality: Patient reports No Pain. Severity: Patient states wound(s) are getting worse. Duration: Patient has had the wound for > 10 months prior to seeking treatment at the wound center Context: The wound would happen gradually Modifying Factors: Patient wound(s)/ulcer(s) are worsening due to : nonhealing nature in spite of various medications and procedures by the podiatrist Dr. Loreta Ave 01/22/2023 Mr. Dayna Geurts is [name] With [name] past medical history of insulin-dependent type 2 diabetes with last hemoglobin A1c of 7.9 complicated by peripheral neuropathy, essential tremors, and OSA that presents the clinic for [name] 3-77-month history of nonhealing ulcer to the plantar aspect of his left foot. He is not sure how it started but over time has progressively become larger. He is not using any offloading device. He has been placing [name] Band-Aid with antibiotic ointment to  the wound bed. He has been following with Dr. Loreta Ave for this issue, podiatry. He last had an x-ray on 12/05/2022 that showed no definitive cortical destruction suggesting osteomyelitis. He currently denies systemic signs of infection. 3/7; patient presents for follow-up. He has been using Hydrofera Blue to the wound bed. He has been using his surgical shoe with peg assist for offloading. He has been taking his oral antibiotics. He has no issues or complaints today. 3/14; patient presents for follow-up. He has been using Hydrofera Blue with  Medihoney to the wound bed. He has been using his surgical shoe with peg assist for offloading. He has no issues or complaints today. 3/19; patient presents for follow-up. He has been using Hydrofera Blue and Medihoney to the wound bed. Plan is for the total contact cast today. He has no issues or complaints today. 3/21; patient presents for obligatory cast change. We have been using Hydrofera Blue and antibiotic ointment under the total contact cast. He tolerated the cast well and has no issues or complaints today. 3/26; patient presents for follow-up. We have been using Hydrofera Blue and antibiotic ointment under the total contact cast. There has been improvement in wound healing. 02/24/2023: The wound is very nearly healed; just [name] tiny narrow opening remains. Joshua [name], Joshua [name] [name] ([id number]) [id number] Page 5 of 10 4/12; patient presents for follow-up. We have been using antibiotic ointment and Hydrofera Blue under the total contact cast. His wound is healed. He threw away his surgical shoe and peg assist earlier in the admission. 05/29/2023 [name] is [name] with [name] past medical history of insulin-dependent type 2 diabetes complicated by peripheral neuropathy that presents the clinic for [name] wound to the left plantar foot. He has been treated in the clinic for the same issue last seen on 03/06/2023 with ultimate healing  of the wound. He was treated with [facility] and the total contact cast and did well with this. He states that about 2 months ago the wound reopened. He has been following with podiatry for this issue. He has been using Silvadene to the wound bed. He denies signs of infection. 7/9; patient presents for follow-up. We have been using antibiotic ointment with Hydrofera Blue under the total contact cast. Wound is smaller. He has no issues or complaints today. Patient History Information obtained from Patient. Family History Diabetes - Maternal Grandparents. Social History Former smoker, Alcohol Use - Moderate - 2 drinks weekly, Drug Use - No History, Caffeine Use - Daily. Medical History Eyes Patient has history of Cataracts - extraction bilat Denies history of Glaucoma, Optic Neuritis Ear/Nose/Mouth/Throat Denies history of Chronic sinus problems/congestion, Middle ear problems Hematologic/Lymphatic Denies history of Anemia, Hemophilia, Human Immunodeficiency Virus, Lymphedema, Sickle Cell Disease Respiratory Denies history of Aspiration, Asthma, Chronic Obstructive Pulmonary Disease (COPD), Pneumothorax, Sleep Apnea, Tuberculosis Cardiovascular Patient has history of Hypertension Denies history of Angina, Arrhythmia, Congestive Heart Failure, Coronary Artery Disease, Deep Vein Thrombosis, Hypotension, Myocardial Infarction, Peripheral Arterial Disease, Peripheral Venous Disease, Phlebitis, Vasculitis Gastrointestinal Denies history of Cirrhosis , Colitis, Crohns, Hepatitis [name], Hepatitis B, Hepatitis C Endocrine Patient has history of Type II Diabetes Denies history of Type I Diabetes Genitourinary Denies history of End Stage Renal Disease Immunological Denies history of Lupus Erythematosus, Raynauds, Scleroderma Integumentary (Skin) Denies history of History of Burn Musculoskeletal Denies history of Gout, Rheumatoid Arthritis, Osteoarthritis, Osteomyelitis Neurologic Patient  has history of Neuropathy Denies history of Dementia, Quadriplegia, Paraplegia, Seizure Disorder Oncologic Denies history of Received Chemotherapy, Received Radiation Psychiatric Denies history of Anorexia/bulimia, Confinement Anxiety Hospitalization/Surgery History - appendectomy. - tonsillectomy. - vasectomy. - nasal septum surgery. Medical [name] Surgical History Notes nd Constitutional Symptoms (General Health) arthro knee surgery Cardiovascular h/o CVA , Neurologic central tremors Oncologic h/o melanoma Objective Constitutional respirations regular, non-labored and within target range for patient.. Vitals Time Taken: 11:20 AM, Height: 76 in, Weight: 250 lbs, BMI: 30.4, Temperature: 98.0 F, Pulse: 73 bpm, Respiratory Rate: 18 breaths/min, Blood Pressure: 150/76 mmHg. Cardiovascular 2+ dorsalis pedis/posterior tibialis pulses. TOREN, Joshua [name] ([id number])  [id number] Page 6 of 10 Psychiatric pleasant and cooperative. General Notes: T the plantar aspect of the left foot to the first metatarsal there is an open wound with granulation tissue. No signs of active infection including o increased warmth, erythema or purulent drainage. Integumentary (Hair, Skin) Wound #5 status is Open. Original cause of wound was Shear/Friction. The date acquired was: 05/08/2023. The wound is located on the Left,Plantar Foot. The wound measures 0.6cm length x 0.4cm width x 0.1cm depth; 0.188cm^2 area and 0.019cm^3 volume. There is Fat Layer (Subcutaneous Tissue) exposed. There is no tunneling or undermining noted. There is [name] medium amount of serosanguineous drainage noted. The wound margin is distinct with the outline attached to the wound base. There is large (67-100%) red, pink granulation within the wound bed. There is [name] small (1-33%) amount of necrotic tissue within the wound bed including Adherent Slough. The periwound skin appearance exhibited: Callus. The periwound skin  appearance did not exhibit: Crepitus, Excoriation, Induration, Rash, Scarring, Dry/Scaly, Maceration, Atrophie Blanche, Cyanosis, Ecchymosis, Hemosiderin Staining, Mottled, Pallor, Rubor, Erythema. Assessment Active Problems ICD-10 Non-pressure chronic ulcer of other part of left foot with fat layer exposed Type 2 diabetes mellitus with foot ulcer Presence of left artificial knee joint Patient's wound has shown improvement in size and appearance since last clinic visit. I recommended continuing the course with Hydrofera Blue and antibiotic ointment under the total contact cast. Follow-up in 1 week. Procedures Wound #5 Pre-procedure diagnosis of Wound #5 is [name] Diabetic Wound/Ulcer of the Lower Extremity located on the Left,Plantar Foot . There was [name] T Research scientist (life sciences) otal Procedure by Geralyn Corwin, DO. Post procedure Diagnosis Wound #5: Same as Pre-Procedure Plan Follow-up Appointments: Return Appointment in 1 week. - Dr. Mikey Bussing 06/09/23 at 8:15am TCC #3 Anesthetic: (In clinic) Topical Lidocaine 5% applied to wound bed Bathing/ Shower/ Hygiene: May shower with protection but do not get wound dressing(s) wet. Protect dressing(s) with water repellant cover (for example, large plastic bag) or [name] cast cover and may then take shower. - Please do not get the T Contact cast wet on left leg. otal Edema Control - Lymphedema / SCD / Other: Elevate legs to the level of the heart or above for 30 minutes daily and/or when sitting for 3-4 times [name] day throughout the day. Avoid standing for long periods of time. Off-Loading: T Contact Cast to Left Lower Extremity - Return 06/01/23 or 06/02/23 to check TCC. otal Other: - Elevate feet when sitting. Place [name] pillow behind the calf to off-load the feet. If so desired. The following medication(s) was prescribed: lidocaine topical 4 % cream cream topical once daily was prescribed at facility WOUND #5: - Foot Wound Laterality: Plantar, Left Cleanser: Soap and  Water 1 x Per Week/30 Days Discharge Instructions: May shower and wash wound with dial antibacterial soap and water prior to dressing change. Cleanser: Vashe 5.8 (oz) 1 x Per Week/30 Days Discharge Instructions: Cleanse the wound with Vashe prior to applying [name] clean dressing using gauze sponges, not tissue or cotton balls. Topical: Gentamicin 1 x Per Week/30 Days Discharge Instructions: As directed by physician Topical: Mupirocin Ointment 1 x Per Week/30 Days Discharge Instructions: Apply Mupirocin (Bactroban) as instructed Prim Dressing: Hydrofera Blue Ready Transfer Foam, 2.5x2.5 (in/in) 1 x Per Week/30 Days ary Discharge Instructions: Apply directly to wound bed as directed Prim Dressing: TCC #3 1 x Per Week/30 Days ary Secondary Dressing: Woven Gauze Sponge, Non-Sterile 4x4 in 1 x Per Week/30 Days Discharge Instructions: Apply over primary dressing  as directed. Secured With: Web designer, Sterile 4x75 (in/in) 1 x Per Week/30 Days Discharge Instructions: Secure with stretch gauze as directed. Secured With: Transpore Surgical T ape, 2x10 (in/yd) 1 x Per Week/30 Days Discharge Instructions: Secure dressing with tape as directed. 1. Hydrofera Blue with antibiotic ointment Joshua [name], Joshua [name] ([id number]) [id number] Page 7 of 10 2. T contact cast placed in standard fashion otal 3. Follow-up in 1 week Electronic Signature(s) Signed: 06/02/2023 2:56:31 PM By: Geralyn Corwin DO Entered By: Geralyn Corwin on 06/02/2023 13:52:37 -------------------------------------------------------------------------------- HxROS Details Patient Name: Date of Service: Joshua [name]. 06/02/2023 11:30 [name] Medical Record Number: [id number] Patient Account Number: [id number] Date of Birth/Sex: Treating RN: March 10, 1951 (72 y.o. M) Primary Care Provider: Creola Corn Other Clinician: Referring Provider: Treating Provider/Extender: Octavia Bruckner in Treatment: 0 Information Obtained From Patient Constitutional Symptoms (General Health) Medical History: Past Medical History Notes: arthro knee surgery Eyes Medical History: Positive for: Cataracts - extraction bilat Negative for: Glaucoma; Optic Neuritis Ear/Nose/Mouth/Throat Medical History: Negative for: Chronic sinus problems/congestion; Middle ear problems Hematologic/Lymphatic Medical History: Negative for: Anemia; Hemophilia; Human Immunodeficiency Virus; Lymphedema; Sickle Cell Disease Respiratory Medical History: Negative for: Aspiration; Asthma; Chronic Obstructive Pulmonary Disease (COPD); Pneumothorax; Sleep Apnea; Tuberculosis Cardiovascular Medical History: Positive for: Hypertension Negative for: Angina; Arrhythmia; Congestive Heart Failure; Coronary Artery Disease; Deep Vein Thrombosis; Hypotension; Myocardial Infarction; Peripheral Arterial Disease; Peripheral Venous Disease; Phlebitis; Vasculitis Past Medical History Notes: h/o CVA , Gastrointestinal Medical History: Negative for: Cirrhosis ; Colitis; Crohns; Hepatitis [name]; Hepatitis B; Hepatitis C Endocrine Medical History: Positive for: Type II Diabetes Negative for: Type I Diabetes Time with diabetes: since 2010 Treated with: Insulin Blood sugar tested every day: Yes Tested : daily Blood sugar testing results: Joshua [name], Joshua [name] ([id number]) [phone].pdf Page 8 of 10 Breakfast: 70 Genitourinary Medical History: Negative for: End Stage Renal Disease Immunological Medical History: Negative for: Lupus Erythematosus; Raynauds; Scleroderma Integumentary (Skin) Medical History: Negative for: History of Burn Musculoskeletal Medical History: Negative for: Gout; Rheumatoid Arthritis; Osteoarthritis; Osteomyelitis Neurologic Medical History: Positive for: Neuropathy Negative for: Dementia; Quadriplegia; Paraplegia; Seizure Disorder Past Medical History  Notes: central tremors Oncologic Medical History: Negative for: Received Chemotherapy; Received Radiation Past Medical History Notes: h/o melanoma Psychiatric Medical History: Negative for: Anorexia/bulimia; Confinement Anxiety HBO Extended History Items Eyes: Cataracts Immunizations Pneumococcal Vaccine: Received Pneumococcal Vaccination: No Immunization Notes: unknown Implantable Devices No devices added Hospitalization / Surgery History Type of Hospitalization/Surgery appendectomy tonsillectomy vasectomy nasal septum surgery Family and Social History Diabetes: Yes - Maternal Grandparents; Former smoker; Alcohol Use: Moderate - 2 drinks weekly; Drug Use: No History; Caffeine Use: Daily; Financial Concerns: No; Food, Clothing or Shelter Needs: No; Support System Lacking: No; Transportation Concerns: No Electronic Signature(s) Signed: 06/02/2023 2:56:31 PM By: Geralyn Corwin DO Entered By: Geralyn Corwin on 06/02/2023 13:50:46 Joshua [name] ([id number]) [id number] Page 9 of 10 -------------------------------------------------------------------------------- Total Contact Cast Details Patient Name: Date of Service: Joshua [name] 06/02/2023 11:30 [name] Medical Record Number: [id number] Patient Account Number: [id number] Date of Birth/Sex: Treating RN: 1951-04-13 (72 y.o. Cline Cools Primary Care Provider: Creola Corn Other Clinician: Referring Provider: Treating Provider/Extender: Octavia Bruckner in Treatment: 0 T Contact Cast Applied for Wound Assessment: otal Wound #5 Left,Plantar Foot Performed By: Physician Geralyn Corwin, DO Post Procedure Diagnosis Same as Pre-procedure Electronic Signature(s) Signed: 06/02/2023 12:14:01 PM By: Redmond Pulling RN, BSN Signed: 06/02/2023 2:56:31 PM By: Geralyn Corwin DO Entered By:  Redmond Pulling on 06/02/2023  11:48:33 -------------------------------------------------------------------------------- SuperBill Details Patient Name: Date of Service: Joshua [name] 06/02/2023 Medical Record Number: [id number] Patient Account Number: [id number] Date of Birth/Sex: Treating RN: 08-13-1951 (72 y.o. Cline Cools Primary Care Provider: Creola Corn Other Clinician: Referring Provider: Treating Provider/Extender: Octavia Bruckner in Treatment: 0 Diagnosis Coding ICD-10 Codes Code Description [phone] Non-pressure chronic ulcer of other part of left foot with fat layer exposed E11.621 Type 2 diabetes mellitus with foot ulcer Z96.652 Presence of left artificial knee joint Facility Procedures : CPT4 Code: [id number] Description: 29445 - APPLY TOTAL CONTACT LEG CAST ICD-10 Diagnosis Description L97.522 Non-pressure chronic ulcer of other part of left foot with fat layer exposed E11.621 Type 2 diabetes mellitus with foot ulcer Modifier: Quantity: 1 Physician Procedures : CPT4 Code Description Modifier [id number] 29445 - WC PHYS APPLY TOTAL CONTACT CAST ICD-10 Diagnosis Description L97.522 Non-pressure chronic ulcer of other part of left foot with fat layer exposed E11.621 Type 2 diabetes mellitus with foot ulcer Quantity: 1 Electronic Signature(s) Signed: 06/02/2023 2:56:31 PM By: Geralyn Corwin DO Previous Signature: 06/02/2023 12:14:01 PM Version By: Redmond Pulling RN, BSN Entered By: Geralyn Corwin on 06/02/2023 13:53:01 Joshua [name] ([id number]) [id number] Page 10 of 10

## 2023-06-03 DIAGNOSIS — E1121 Type 2 diabetes mellitus with diabetic nephropathy: Secondary | ICD-10-CM | POA: Diagnosis not present

## 2023-06-07 NOTE — Progress Notes (Deleted)
Cardiology Office Note:    Date:  06/07/2023   ID:  Joshua [name], DOB [birth year], MRN [id number]  PCP:  Joshua Corn, MD  Cardiologist:  Joshua Ishikawa, MD  Electrophysiologist:  None   Referring MD: Joshua Corn, MD   No chief complaint on file.   History of Present Illness:     Joshua [name] is a 72 y.o. male with a hx of HCM, hypertension, diabetes, OSA, PE after knee surgery, essential tremor status post DBS June 2020, CVA who presents for follow-up.  Admitted to [facility] from 08/28/19 through 08/31/19 with an acute CVA.  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  MRI brain showed right internal capsule infarct.  Echocardiogram showed no cardiogenic source of embolism.  He was started on heparin given subclavian artery thrombosis, and this was transitioned to Eliquis.  It was unclear if the thrombosis was due to a cardiac source or developed in situ.  Stroke team recommended Eliquis for 2 months and repeating CTA neck; if CTA neck negative and no atrial fibrillation noted on 30-day monitor, stroke team recommended stopping Eliquis and resuming antiplatelet agent.  TTE was notable for an incidental finding of asymmetric basal septal hypertrophy.  He was referred to cardiology and seen on 09/19/19.  Cardiac MRI was ordered, which showed Basal septal hypertrophy measuring up to 20mm (lateral wall 12mm), consistent with hypertrophic cardiomyopathy.  PYP scan showed no evidence of amyloid.  Cardiac monitor showed no VT, occasional PVCs (1.3% of beats).   Repeat monitor on 08/22/2020 showed 1 4 beat run of NSVT, 6 runs of SVT longest lasting 16 beats, occasional PVCs (4%).  He denies any family history of HCM.  Does have vertigo.  No syncope except with CVA years ago.  He was admitted to [facility] from 3/8 through 02/02/2021 with shortness of breath and elevated effort for cardiac fusion.  Started on colchicine and Eliquis was held.  No adequate window  for pericardiocentesis.  Repeat echo 03/08/2021 showed pericardial effusion had resolved, LVEF 55 to 60%, grade 1 diastolic dysfunction, moderate LVH, strain abnormalities suggestive of cardiac amyloidosis.  Lexiscan Myoview on 02/12/2021 showed no evidence of ischemia, EF 53%.  Echocardiogram 07/2022 showed EF 55%, moderate LVH, grade 1 diastolic dysfunction, normal RV function, no significant valvular disease, dilated ascending aorta measuring 43 mm.  Since last clinic visit,  he reports that he has been doing okay.  Has been having dyspnea with minimal exertion.  Denies any chest pain.  Denies any lightheadedness, syncope, lower extremity edema, or palpitations.   Wt Readings from Last 3 Encounters:  04/27/23 256 lb 12.8 oz (116.5 kg)  01/23/23 254 lb (115.2 kg)  10/20/22 259 lb (117.5 kg)    Past Medical History:  Diagnosis Date   Benign essential tremor    Benign positional vertigo    Chronic kidney disease 08/28/2019   CVA (cerebral vascular accident) (HCC)    x2 - L retina, 1 right parietal   Degenerative arthritis    Depression    Diabetes mellitus    DVT (deep venous thrombosis) (HCC) 2018   Dyslipidemia    GERD (gastroesophageal reflux disease)    hiatal hernia   Gout    H/O: vasectomy    Hearing aid worn    b/l   Hx of appendectomy    Hx of tonsillectomy    Hypertension    Hypertrophic cardiomyopathy (HCC)    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   Melanoma [facility])    NSVT (nonsustained ventricular tachycardia) (HCC)    1 4 beat run on event monitor in 07/2020   Obesity    OSA on CPAP    setting = 5   Pulmonary emboli (HCC) 2018   PVC's (premature ventricular contractions)    SVT (supraventricular tachycardia)    by event monitor   Tremor, essential 06/22/2017   Wears glasses     Past Surgical History:  Procedure Laterality Date   APPENDECTOMY     arthroscopic knee surgery Bilateral    CATARACT EXTRACTION Bilateral    COLONOSCOPY     MINOR PLACEMENT OF  FIDUCIAL N/A 06/30/2019   Procedure: Fiducial placement;  Surgeon: Maeola Harman, MD;  Location: [facility] OR;  Service: Neurosurgery;  Laterality: N/A;  Fiducial placement   NASAL SEPTUM SURGERY     PULSE GENERATOR IMPLANT N/A 07/14/2019   Procedure: Left cranial Implanted Pulse Generator and lead extension placement to right chest ;  Surgeon: Maeola Harman, MD;  Location: [facility] OR;  Service: Neurosurgery;  Laterality: N/A;   SUBTHALAMIC STIMULATOR INSERTION Left 07/07/2019   Procedure: LEFT DEEP BRAIN STIMULATOR PLACEMENT;  Surgeon: Maeola Harman, MD;  Location: [facility] OR;  Service: Neurosurgery;  Laterality: Left;   TEE WITHOUT CARDIOVERSION N/A 04/23/2021   Procedure: TRANSESOPHAGEAL ECHOCARDIOGRAM (TEE);  Surgeon: Elease Hashimoto Deloris Ping, MD;  Location: [facility] ENDOSCOPY;  Service: Cardiovascular;  Laterality: N/A;   TONSILLECTOMY     TOTAL KNEE ARTHROPLASTY Left 03/30/2017   Procedure: LEFT TOTAL KNEE ARTHROPLASTY;  Surgeon: Durene Romans, MD;  Location: [facility] ORS;  Service: Orthopedics;  Laterality: Left;   WISDOM TOOTH EXTRACTION      Current Medications: No outpatient medications have been marked as taking for the 06/08/23 encounter (Appointment) with Joshua Ishikawa, MD.     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  Tobacco Use   Smoking status: Former    Current packs/day: 0.00    Average packs/day: 1 pack/day for 10.0 years (10.0 ttl pk-yrs)    Types: Cigarettes    Start date: 11/24/1973    Quit date: 11/25/1983    Years since quitting: 39.5   Smokeless tobacco: Never  Vaping Use   Vaping status: Never Used  Substance and Sexual Activity   Alcohol use: Yes    Alcohol/week: 2.0 standard drinks of  alcohol    Types: 2 Standard drinks or equivalent per week    Comment: occassionally   Drug use: No   Sexual activity: Not on file  Other Topics Concern   Not on file  Social History Narrative   Patient lives at home with wife. Joshua [name](   Patient has 2 children that are in good health.    Patient works for [employer]. Retired .   Patient has a Bachelors degree in History.       Right handed    Lives in one story home - Handicap accessible    Social Determinants of Health   Financial Resource Strain: Not on file  Food Insecurity: Not on file  Transportation Needs: Not on file  Physical Activity: Not on file  Stress: Not  on file  Social Connections: Not on file     Family History: The patient's family history includes Alzheimer's disease in his father; Cerebral aneurysm in his mother; Diabetes in his maternal grandmother; Healthy in his son; Tremor in his brother, maternal uncle, and mother. There is no history of Colon cancer.  ROS:   Please see the history of present illness.     All other systems reviewed and are negative.  EKGs/Labs/Other Studies Reviewed:    The following studies were reviewed today:   EKG:   02/12/22: Sinus rhythm, first-degree AV block, rate 72, left axis deviation, poor R wave progression, Q waves in inferior leads 04/30/2021: sinus rhythm, first-degree AV block, rate 67, right axis deviation, Q waves in V1-3 03/26/2021: sinus rhythm, first-degree AV block, rate 92, left axis deviation, Q waves II, III, aVF, poor R wave progression  TEE 04/23/2021: 1. Left ventricular ejection fraction, by estimation, is 60 to 65%. The  left ventricle has normal function.   2. Right ventricular systolic function is normal. The right ventricular  size is normal.   3. No left atrial/left atrial appendage thrombus was detected.   4. Systolic anterior motion of the mitral valve leaflets is present . The  mitral valve is normal in structure. No evidence of  mitral valve  regurgitation.   5. The aortic valve is normal in structure. Aortic valve regurgitation is  not visualized.   Echo 03/08/2021:  1. Although overall left ventricular GLS is normal, the distribution of  strain abnormalities is strongly suggestive of cardiac amyloidosis  ("cherry on top" pattern). Left ventricular ejection fraction, by  estimation, is 55 to 60%. The left ventricle has   normal function. The left ventricle has no regional wall motion  abnormalities. There is moderate concentric left ventricular hypertrophy.  Left ventricular diastolic parameters are consistent with Grade I  diastolic dysfunction (impaired relaxation). The  average left ventricular global longitudinal strain is -19.1 %. The global  longitudinal strain is normal.   2. Right ventricular systolic function is normal. The right ventricular  size is normal.   3. The mitral valve is normal in structure. No evidence of mitral valve  regurgitation.   4. The aortic valve is tricuspid. Aortic valve regurgitation is not  visualized. Mild to moderate aortic valve sclerosis/calcification is  present, without any evidence of aortic stenosis.   5. Aortic dilatation noted. There is moderate dilatation of the ascending  aorta, measuring 45 mm.   Comparison(s): No significant change from prior study. Prior images  reviewed side by side. Pericardial effusion has resolved. Note GLS pattern  and marked LVH suggestive of cardiac amyloidosis, but also note normal  Tc24m-PYP scan. Consider evaluation for  light chain disease if not yet performed.  Lexiscan Myoview 02/12/2021: Nuclear stress EF: 52%. There was no ST segment deviation noted during stress. This is a low risk study with no evidence of ischemia. The left ventricular ejection fraction is mildly decreased (45-54%). Visually, systolic function appears normal.  Echo 01/30/2021: 1. Large pericardial effusion. The pericardial effusion is anterior to  the  right ventricle. There is no evidence of increased pericardial  pressure. Echodensity within the effusion may suggest exudative nature.   2. Left ventricular ejection fraction, by estimation, is 55 to 60%. The  left ventricle has normal function. Left ventricular endocardial border  not optimally defined to evaluate regional wall motion. There is moderate  concentric left ventricular  hypertrophy. Left ventricular diastolic parameters are indeterminate.   3.  Right ventricular systolic function was not well visualized. The right  ventricular size is not well visualized.   4. The mitral valve was not well visualized. No evidence of mitral valve  regurgitation.   5. The aortic valve was not well visualized. Aortic valve regurgitation  is not visualized. No aortic stenosis is present.   6. Aortic dilatation noted. There is mild dilatation of the aortic root,  measuring 40 mm.   7. The inferior vena cava is normal in size with <50% respiratory  variability, suggesting right atrial pressure of 8 mmHg.   Comparison(s): A prior study was performed on 08/29/2019. Prior images  reviewed side by side. Difficult comparsion- this study is much more  technically difficult. Significant increase in pericardial effusion.  Primary cardiology team made aware.  8 day Zio Monitor 08/22/2020: One 4 beat run of NSVT 6 runs of SVT, longest lasting 16 beats Occasional PVCs (3.9%) 8 days of data recorded on Zio monitor. Patient had a min HR of 62 bpm, max HR of 162 bpm, and avg HR of 86 bpm. Predominant underlying rhythm was Sinus Rhythm. No atrial fibrillation, high degree block, or pauses noted. One 4 beat run of NSVT.  6 runs of SVT, longest lasting 16 beats.  Isolated atrial ectopy was rare (<1%).  Isolated ventricular ectopy was occasional (3.9%).  There were 0 triggered events.    Cardiac MRI 10/07/19: 1. Limited study, as only a few sequences were able to be completed due to limitations from presence of  deep brain stimulator 2. Basal septal hypertrophy measuring up to 20mm (lateral wall 12 mm), consistent with hypertrophic cardiomyopathy 3. Patchy late gadolinium enhancement in basal septum, consistent with HCM 4. Basal inferolateral midwall LGE, which would not be a typical pattern for HCM, as more commonly seen in setting of prior myocarditis or sarcoidosis. 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  Normal LV size with hyperdynamic systolic function (EF 69%) 6.  Normal RV size and systolic function (EF 61%)  TTE 08/29/19:  1. Technically difficult study. Left ventricular ejection fraction appears grossly normal, approximately 55-60%, though difficult visualization even with contrast  2. There is asymmetric basal septal hypertrophy measuring 18 mm in basal septum (12 mm posterior wall). Consider cardiac MRI to assess for hypertrophic cardiomyopathy if clinically indicated  3. Definity contrast agent was given IV to delineate the left ventricular endocardial borders.  4. Global right ventricle has normal systolic function.The right ventricular size is normal. No increase in right ventricular wall thickness.  5. There is mild dilatation of the aortic root measuring 39 mm.  6. The inferior vena cava is dilated in size with <50% respiratory variability, suggesting right atrial pressure of 15 mmHg.  PYP scan 10/25/19: The study is normal. No evidence of TTR amyloidosis.   Cardiac monitor 11/11/19: No significant abnormalities No atrial fibrillation. No VT Occasional PVCs (1.3% of beats). 1 patient triggered event, which appears to correspond to short pause (1.1 seconds) from a blocked PA   Predominant rhythm is sinus rhythm. Range is 57 to 137 bpm with average of 89 bpm. No atrial fibrillation, sustained ventricular tachycardia, significant pause, or high degree AV block. Occasional PVCs (1.3% of beats). 1 patient triggered events. Triggered  event appears to correspond to short pause (1.1 seconds) from a blocked PAC.  No significant abnormalities.    Recent Labs: 07/30/2022: BNP 19.6 01/23/2023: ALT 19; BUN 32; Creatinine, Ser 2.00; Hemoglobin 12.1; Platelets 264; Potassium 4.0; Sodium  141  Recent Lipid Panel    Component Value Date/Time   CHOL 110 04/23/2021 0717   CHOL 161 10/24/2020 0943   TRIG 172 (H) 04/23/2021 0717   HDL 23 (L) 04/23/2021 0717   HDL 30 (L) 10/24/2020 0943   CHOLHDL 4.8 04/23/2021 0717   VLDL 34 04/23/2021 0717   LDLCALC 53 04/23/2021 0717   LDLCALC 99 10/24/2020 0943    Physical Exam:    VS:  There were no vitals taken for this visit.    Wt Readings from Last 3 Encounters:  04/27/23 256 lb 12.8 oz (116.5 kg)  01/23/23 254 lb (115.2 kg)  10/20/22 259 lb (117.5 kg)     GEN: Well nourished, well developed in no acute distress HEENT: Normal NECK: No JVD CARDIAC: RRR, no murmurs, rubs, gallops RESPIRATORY:  Clear to auscultation without rales, wheezing or rhonchi  ABDOMEN: Soft, non-tender, non-distended MUSCULOSKELETAL:  No edema; No deformity  SKIN: Warm and dry NEUROLOGIC:  Alert and oriented x 3 PSYCHIATRIC:  Normal affect   ASSESSMENT:    No diagnosis found.   PLAN:    Hypertrophic cardiomyopathy: 20mm (lateral wall 12 mm) on CMR.  In addition, had unusual scar pattern for HCM, with basal inferolateral scar on MRI.  Fabry's disease can be associated with this scar pattern and asymmetric hypertrophy, so alpha galactosidase was checked and was normal. Scar pattern not c/w amyloid on CMR, but unfortunately unable to do T1 mapping to r/o amyloid due to DBS.  No evidence of amyloid on PYP scan in 2020.  Scar pattern may represent prior episode of myocarditis.  Suspect HCM.  No obstruction.  Recommended screening of first-degree relatives with TTE, he reports that he has discussed with his sons.  Cardiac monitor on 08/22/2020 showed one 4 beat run of NSVT, 6 runs of SVT longest lasting 16  beats, occasional PVCs (4%).  Echocardiogram 07/2022 showed EF 55%, moderate LVH, grade 1 diastolic dysfunction, normal RV function, no significant valvular disease, dilated ascending aorta measuring 43 mm. -SPEP/UPEP/light chains do not show evidence of AL amyloid.  PYP scan negative in 2020, repeated given high clinical concern for amyloid; PYP on 06/07/2021 not suggestive of amyloidosis. -Continue Toprol-XL 50 mg daily -Favor avoiding scheduled diuretics given suspected HCM.  Appears euvolemic.  Continue Lasix as needed.  Asked to monitor daily weights and take if gains more than 3 pounds in 1 day or 5 pounds in 1 week  Pericardial effusion: Large effusion noted on echocardiogram 01/2021.  Started on colchicine for suspected pericarditis.  Repeat echocardiogram 03/12/2021 showed resolution of effusion -Restarted Eliquis given effusion resolved -Colchicine discontinued after completing 91-month course  Dyspnea: Reports dyspnea with minimal exertion.  Lexiscan Myoview showed no evidence of ischemia.  Echocardiogram 07/2022 showed EF 55%, moderate LVH, grade 1 diastolic dysfunction, normal RV function, no significant valvular disease, dilated ascending aorta measuring 43 mm.  CVA: Subclavian artery thrombosis diagnosed, unclear if cardioembolic source or developed in situ.    No AF on cardiac monitor.  Continue Eliquis 5 mg twice daily and aspirin 81 mg daily added per neurology during recent admission.   Hypertension: On amlodipine 5 mg daily,  losartan 100 mg daily, Toprol-XL 50 mg daily   Hyperlipidemia: On atorvastatin 80 mg daily.  LDL 99 on 10/24/2020.  Calcium score 588 (68th percentile) 01/2021.  Zetia 10 mg daily was added.  LDL 71 on 09/16/21.   Type 2 diabetes: A1c 8.9% on 01/30/2021***.  On insulin  PVCs: occasional (1.3%) on  monitor.  He is asymptomatic and with normal LV systolic function, no treatment indicated  Leg pain: ABIs 04/2023 showed normal right, noncompressible left with normal  toe-brachial index  OSA: On CPAP   RTC in 6 months***    Medication Adjustments/Labs and Tests Ordered: Current medicines are reviewed at length with the patient today.  Concerns regarding medicines are outlined above.  No orders of the defined types were placed in this encounter.  No orders of the defined types were placed in this encounter.   There are no Patient Instructions on file for this visit.      Signed, Joshua Ishikawa, MD  06/07/2023 12:50 PM    [facility]

## 2023-06-08 ENCOUNTER — Ambulatory Visit (HOSPITAL_BASED_OUTPATIENT_CLINIC_OR_DEPARTMENT_OTHER): Payer: Medicare PPO | Admitting: Internal Medicine

## 2023-06-08 ENCOUNTER — Ambulatory Visit: Payer: Medicare PPO | Attending: Cardiology | Admitting: Cardiology

## 2023-06-09 ENCOUNTER — Encounter (HOSPITAL_BASED_OUTPATIENT_CLINIC_OR_DEPARTMENT_OTHER): Payer: Medicare PPO | Attending: Internal Medicine | Admitting: Internal Medicine

## 2023-06-09 DIAGNOSIS — E11621 Type 2 diabetes mellitus with foot ulcer: Secondary | ICD-10-CM | POA: Diagnosis not present

## 2023-06-09 DIAGNOSIS — Z794 Long term (current) use of insulin: Secondary | ICD-10-CM | POA: Insufficient documentation

## 2023-06-09 DIAGNOSIS — L97522 Non-pressure chronic ulcer of other part of left foot with fat layer exposed: Secondary | ICD-10-CM | POA: Insufficient documentation

## 2023-06-09 DIAGNOSIS — E1142 Type 2 diabetes mellitus with diabetic polyneuropathy: Secondary | ICD-10-CM | POA: Insufficient documentation

## 2023-06-09 DIAGNOSIS — G4733 Obstructive sleep apnea (adult) (pediatric): Secondary | ICD-10-CM | POA: Insufficient documentation

## 2023-06-09 DIAGNOSIS — Z96652 Presence of left artificial knee joint: Secondary | ICD-10-CM | POA: Insufficient documentation

## 2023-06-09 NOTE — Progress Notes (Signed)
Joshua [name] ([id number]) [id number] Page 1 of 9 Visit Report for 06/09/2023 Chief Complaint Document Details Patient Name: Date of Service: Joshua [name] 06/09/2023 8:15 [name] Medical Record Number: [id number] Patient Account Number: [id number] Date of Birth/Sex: Treating RN: [birth year] (72 y.o. M) Primary Care Provider: Creola Corn Other Clinician: Referring Provider: Treating Provider/Extender: Octavia Bruckner in Treatment: 1 Information Obtained from: Patient Chief Complaint 01/22/2023; left plantar diabetic foot ulcer 05/29/2023; left plantar diabetic foot ulcer Electronic Signature(s) Signed: 06/09/2023 4:51:26 PM By: Geralyn Corwin DO Entered By: Geralyn Corwin on 06/09/2023 08:46:05 -------------------------------------------------------------------------------- HPI Details Patient Name: Date of Service: Joshua [name]. 06/09/2023 8:15 [name] Medical Record Number: [id number] Patient Account Number: [id number] Date of Birth/Sex: Treating RN: [birth year] (72 y.o. M) Primary Care Provider: Creola Corn Other Clinician: Referring Provider: Treating Provider/Extender: Octavia Bruckner in Treatment: 1 History of Present Illness Location: left foot was the initial wound and then he has one on the right foot Quality: Patient reports No Pain. Severity: Patient states wound(s) are getting worse. Duration: Patient has had the wound for > 10 months prior to seeking treatment at the wound center Context: The wound would happen gradually Modifying Factors: Patient wound(s)/ulcer(s) are worsening due to : nonhealing nature in spite of various medications and procedures by the podiatrist Dr. Loreta Ave HPI Description: 01/22/2023 [name] is [name] With [name] past medical history of insulin-dependent type 2 diabetes with last hemoglobin A1c of 7.9 complicated by peripheral neuropathy, essential tremors,  and OSA that presents the clinic for [name] 3-19-month history of nonhealing ulcer to the plantar aspect of his left foot. He is not sure how it started but over time has progressively become larger. He is not using any offloading device. He has been placing [name] Band-Aid with antibiotic ointment to the wound bed. He has been following with Dr. Loreta Ave for this issue, podiatry. He last had an x-ray on 12/05/2022 that showed no definitive cortical destruction suggesting osteomyelitis. He currently denies systemic signs of infection. 3/7; patient presents for follow-up. He has been using Hydrofera Blue to the wound bed. He has been using his surgical shoe with peg assist for offloading. He has been taking his oral antibiotics. He has no issues or complaints today. 3/14; patient presents for follow-up. He has been using Hydrofera Blue with Medihoney to the wound bed. He has been using his surgical shoe with peg assist for offloading. He has no issues or complaints today. 3/19; patient presents for follow-up. He has been using Hydrofera Blue and Medihoney to the wound bed. Plan is for the total contact cast today. He has no issues or complaints today. 3/21; patient presents for obligatory cast change. We have been using Hydrofera Blue and antibiotic ointment under the total contact cast. He tolerated the cast well and has no issues or complaints today. 3/26; patient presents for follow-up. We have been using Hydrofera Blue and antibiotic ointment under the total contact cast. There has been improvement in wound healing. 02/24/2023: The wound is very nearly healed; just [name] tiny narrow opening remains. Joshua [name] ([id number]) [id number] Page 2 of 9 4/12; patient presents for follow-up. We have been using antibiotic ointment and Hydrofera Blue under the total contact cast. His wound is healed. He threw away his surgical shoe and peg assist earlier in the admission. 05/29/2023 [name] is [name] with [name] past medical history of  insulin-dependent type 2 diabetes complicated by peripheral neuropathy that presents the clinic for [name] wound to the left plantar foot. He has been treated in the clinic for the same issue last seen on 03/06/2023 with ultimate healing of the wound. He was treated with [facility] and the total contact cast and did well with this. He states that about 2 months ago the wound reopened. He has been following with podiatry for this issue. He has been using Silvadene to the wound bed. He denies signs of infection. 7/9; patient presents for follow-up. We have been using antibiotic ointment with Hydrofera Blue under the total contact cast. Wound is smaller. He has no issues or complaints today. 7/16; Patient presents for follow up. We have been using Hydrofera Blue under the total contact cast. Patient fell when trying to stand up from [name] seated position. He declines having the cast placed today. He is going out of town next week. Wound is much smaller today. Electronic Signature(s) Signed: 06/09/2023 4:51:26 PM By: Geralyn Corwin DO Entered By: Geralyn Corwin on 06/09/2023 08:49:41 -------------------------------------------------------------------------------- Physical Exam Details Patient Name: Date of Service: Joshua [name]. 06/09/2023 8:15 [name] Medical Record Number: [id number] Patient Account Number: [id number] Date of Birth/Sex: Treating RN: 21-Jun-1951 (72 y.o. M) Primary Care Provider: Creola Corn Other Clinician: Referring Provider: Treating Provider/Extender: Octavia Bruckner in Treatment: 1 Constitutional respirations regular, non-labored and within target range for patient.. Cardiovascular 2+ dorsalis pedis/posterior tibialis pulses. Psychiatric pleasant and cooperative. Notes T the plantar aspect of the left foot to the first metatarsal there is an open wound with granulation tissue. No signs of  active infection including increased o warmth, erythema or purulent drainage. Electronic Signature(s) Signed: 06/09/2023 4:51:26 PM By: Geralyn Corwin DO Entered By: Geralyn Corwin on 06/09/2023 09:08:38 -------------------------------------------------------------------------------- Physician Orders Details Patient Name: Date of Service: Joshua [name]. 06/09/2023 8:15 [name] Medical Record Number: [id number] Patient Account Number: [id number] Date of Birth/Sex: Treating RN: 03-02-1951 (72 y.o. Tammy Sours Primary Care Provider: Creola Corn Other Clinician: Referring Provider: Treating Provider/Extender: Octavia Bruckner in Treatment: 1 Verbal / Phone Orders: No Diagnosis Coding ICD-10 Coding Code Description ROBT, OKUDA ([id number]) [id number] Page 3 of 9 [phone] Non-pressure chronic ulcer of other part of left foot with fat layer exposed E11.621 Type 2 diabetes mellitus with foot ulcer Z96.652 Presence of left artificial knee joint Follow-up Appointments ppointment in 1 week. - Dr. Mikey Bussing Thursday 0800 06/18/2023 Return [name] ***ROOM 9**** ppointment in 2 weeks. - Dr. Mikey Bussing Thursday 0800 06/25/2023 Return [name] ****Room 8**** Anesthetic (In clinic) Topical Lidocaine 5% applied to wound bed Bathing/ Shower/ Hygiene May shower with protection but do not get wound dressing(s) wet. Protect dressing(s) with water repellant cover (for example, large plastic bag) or [name] cast cover and may then take shower. - Please do not get the T Contact cast wet on left leg. otal Edema Control - Lymphedema / SCD / Other Elevate legs to the level of the heart or above for 30 minutes daily and/or when sitting for 3-4 times [name] day throughout the day. Avoid standing for long periods of time. Off-Loading Open toe surgical shoe to: - left foot wear while walking and standing. No bare feet or just socks. Other: - Elevate feet when sitting. Place [name]  pillow behind the calf to off-load the feet. If so desired. Wound Treatment Wound #5 - Foot Wound Laterality: Plantar, Left Cleanser: Soap and Water 1  x Per Day/30 Days Discharge Instructions: May shower and wash wound with dial antibacterial soap and water prior to dressing change. Cleanser: Vashe 5.8 (oz) 1 x Per Day/30 Days Discharge Instructions: Cleanse the wound with Vashe prior to applying [name] clean dressing using gauze sponges, not tissue or cotton balls. Prim Dressing: Hydrofera Blue Ready Transfer Foam, 2.5x2.5 (in/in) 1 x Per Day/30 Days ary Discharge Instructions: Apply directly to wound bed as directed Secondary Dressing: Optifoam Non-Adhesive Dressing, 4x4 in 1 x Per Day/30 Days Discharge Instructions: Apply over primary dressing foam donut. Secondary Dressing: Woven Gauze Sponges 2x2 in 1 x Per Day/30 Days Discharge Instructions: Apply over primary dressing as directed. Secured With: Web designer, Sterile 4x75 (in/in) 1 x Per Day/30 Days Discharge Instructions: Secure with stretch gauze as directed. Secured With: Transpore Surgical Tape, 2x10 (in/yd) 1 x Per Day/30 Days Discharge Instructions: Secure dressing with tape as directed. Electronic Signature(s) Signed: 06/09/2023 4:51:26 PM By: Geralyn Corwin DO Entered By: Geralyn Corwin on 06/09/2023 09:08:45 -------------------------------------------------------------------------------- Problem List Details Patient Name: Date of Service: Joshua [name]. 06/09/2023 8:15 [name] Medical Record Number: [id number] Patient Account Number: [id number] Date of Birth/Sex: Treating RN: 10/16/1951 (73 y.o. Tammy Sours Primary Care Provider: Creola Corn Other Clinician: Referring Provider: Treating Provider/Extender: Octavia Bruckner in Treatment: 1 Active Problems ICD-10 Encounter Code Description Active Date MDM Diagnosis ABRAHAN, FULMORE [name] ([id number])  [id number] Page 4 of 9 [phone] Non-pressure chronic ulcer of other part of left foot with fat layer exposed 05/29/2023 No Yes E11.621 Type 2 diabetes mellitus with foot ulcer 05/29/2023 No Yes Z96.652 Presence of left artificial knee joint 05/29/2023 No Yes Inactive Problems Resolved Problems Electronic Signature(s) Signed: 06/09/2023 4:51:26 PM By: Geralyn Corwin DO Entered By: Geralyn Corwin on 06/09/2023 08:45:52 -------------------------------------------------------------------------------- Progress Note Details Patient Name: Date of Service: Joshua [name]. 06/09/2023 8:15 [name] Medical Record Number: [id number] Patient Account Number: [id number] Date of Birth/Sex: Treating RN: 1951/01/27 (72 y.o. M) Primary Care Provider: Creola Corn Other Clinician: Referring Provider: Treating Provider/Extender: Octavia Bruckner in Treatment: 1 Subjective Chief Complaint Information obtained from Patient 01/22/2023; left plantar diabetic foot ulcer 05/29/2023; left plantar diabetic foot ulcer History of Present Illness (HPI) The following HPI elements were documented for the patient's wound: Location: left foot was the initial wound and then he has one on the right foot Quality: Patient reports No Pain. Severity: Patient states wound(s) are getting worse. Duration: Patient has had the wound for > 10 months prior to seeking treatment at the wound center Context: The wound would happen gradually Modifying Factors: Patient wound(s)/ulcer(s) are worsening due to : nonhealing nature in spite of various medications and procedures by the podiatrist Dr. Loreta Ave 01/22/2023 Mr. Vincenzo Stave is [name] With [name] past medical history of insulin-dependent type 2 diabetes with last hemoglobin A1c of 7.9 complicated by peripheral neuropathy, essential tremors, and OSA that presents the clinic for [name] 3-22-month history of nonhealing ulcer to the plantar  aspect of his left foot. He is not sure how it started but over time has progressively become larger. He is not using any offloading device. He has been placing [name] Band-Aid with antibiotic ointment to the wound bed. He has been following with Dr. Loreta Ave for this issue, podiatry. He last had an x-ray on 12/05/2022 that showed no definitive cortical destruction suggesting osteomyelitis. He currently denies systemic signs of infection. 3/7; patient presents for  follow-up. He has been using Hydrofera Blue to the wound bed. He has been using his surgical shoe with peg assist for offloading. He has been taking his oral antibiotics. He has no issues or complaints today. 3/14; patient presents for follow-up. He has been using Hydrofera Blue with Medihoney to the wound bed. He has been using his surgical shoe with peg assist for offloading. He has no issues or complaints today. 3/19; patient presents for follow-up. He has been using Hydrofera Blue and Medihoney to the wound bed. Plan is for the total contact cast today. He has no issues or complaints today. 3/21; patient presents for obligatory cast change. We have been using Hydrofera Blue and antibiotic ointment under the total contact cast. He tolerated the cast well and has no issues or complaints today. 3/26; patient presents for follow-up. We have been using Hydrofera Blue and antibiotic ointment under the total contact cast. There has been improvement in wound healing. 02/24/2023: The wound is very nearly healed; just [name] tiny narrow opening remains. 4/12; patient presents for follow-up. We have been using antibiotic ointment and Hydrofera Blue under the total contact cast. His wound is healed. He threw away his surgical shoe and peg assist earlier in the admission. ALEJOS, REINHARDT [name] ([id number]) [id number] Page 5 of 9 05/29/2023 Mr. Adom Schoeneck is [name] with [name] past medical history of insulin-dependent type 2  diabetes complicated by peripheral neuropathy that presents the clinic for [name] wound to the left plantar foot. He has been treated in the clinic for the same issue last seen on 03/06/2023 with ultimate healing of the wound. He was treated with [facility] and the total contact cast and did well with this. He states that about 2 months ago the wound reopened. He has been following with podiatry for this issue. He has been using Silvadene to the wound bed. He denies signs of infection. 7/9; patient presents for follow-up. We have been using antibiotic ointment with Hydrofera Blue under the total contact cast. Wound is smaller. He has no issues or complaints today. 7/16; Patient presents for follow up. We have been using Hydrofera Blue under the total contact cast. Patient fell when trying to stand up from [name] seated position. He declines having the cast placed today. He is going out of town next week. Wound is much smaller today. Patient History Information obtained from Patient. Family History Diabetes - Maternal Grandparents. Social History Former smoker, Alcohol Use - Moderate - 2 drinks weekly, Drug Use - No History, Caffeine Use - Daily. Medical History Eyes Patient has history of Cataracts - extraction bilat Denies history of Glaucoma, Optic Neuritis Ear/Nose/Mouth/Throat Denies history of Chronic sinus problems/congestion, Middle ear problems Hematologic/Lymphatic Denies history of Anemia, Hemophilia, Human Immunodeficiency Virus, Lymphedema, Sickle Cell Disease Respiratory Denies history of Aspiration, Asthma, Chronic Obstructive Pulmonary Disease (COPD), Pneumothorax, Sleep Apnea, Tuberculosis Cardiovascular Patient has history of Hypertension Denies history of Angina, Arrhythmia, Congestive Heart Failure, Coronary Artery Disease, Deep Vein Thrombosis, Hypotension, Myocardial Infarction, Peripheral Arterial Disease, Peripheral Venous Disease, Phlebitis,  Vasculitis Gastrointestinal Denies history of Cirrhosis , Colitis, Crohns, Hepatitis [name], Hepatitis B, Hepatitis C Endocrine Patient has history of Type II Diabetes Denies history of Type I Diabetes Genitourinary Denies history of End Stage Renal Disease Immunological Denies history of Lupus Erythematosus, Raynauds, Scleroderma Integumentary (Skin) Denies history of History of Burn Musculoskeletal Denies history of Gout, Rheumatoid Arthritis, Osteoarthritis, Osteomyelitis Neurologic Patient has history of Neuropathy Denies history of Dementia, Quadriplegia, Paraplegia, Seizure Disorder Oncologic  Denies history of Received Chemotherapy, Received Radiation Psychiatric Denies history of Anorexia/bulimia, Confinement Anxiety Hospitalization/Surgery History - appendectomy. - tonsillectomy. - vasectomy. - nasal septum surgery. Medical [name] Surgical History Notes nd Constitutional Symptoms (General Health) arthro knee surgery Cardiovascular h/o CVA , Neurologic central tremors Oncologic h/o melanoma Objective Constitutional respirations regular, non-labored and within target range for patient.. Vitals Time Taken: 8:14 AM, Height: 76 in, Weight: 250 lbs, BMI: 30.4, Temperature: 98.9 F, Pulse: 111 bpm, Respiratory Rate: 20 breaths/min, Blood Pressure: 111/75 mmHg, Capillary Blood Glucose: 161 mg/dl. Cardiovascular 2+ dorsalis pedis/posterior tibialis pulses. STRATTON, VILLWOCK [name] ([id number]) [id number] Page 6 of 9 Psychiatric pleasant and cooperative. General Notes: T the plantar aspect of the left foot to the first metatarsal there is an open wound with granulation tissue. No signs of active infection including o increased warmth, erythema or purulent drainage. Integumentary (Hair, Skin) Wound #5 status is Open. Original cause of wound was Shear/Friction. The date acquired was: 05/08/2023. The wound has been in treatment 1 weeks. The wound is located on the  Left,Plantar Foot. The wound measures 0.2cm length x 0.2cm width x 0.1cm depth; 0.031cm^2 area and 0.003cm^3 volume. There is Fat Layer (Subcutaneous Tissue) exposed. There is no tunneling or undermining noted. There is [name] small amount of serosanguineous drainage noted. The wound margin is distinct with the outline attached to the wound base. There is large (67-100%) red, pink granulation within the wound bed. There is no necrotic tissue within the wound bed. The periwound skin appearance exhibited: Callus, Maceration. The periwound skin appearance did not exhibit: Crepitus, Excoriation, Induration, Rash, Scarring, Dry/Scaly, Atrophie Blanche, Cyanosis, Ecchymosis, Hemosiderin Staining, Mottled, Pallor, Rubor, Erythema. Assessment Active Problems ICD-10 Non-pressure chronic ulcer of other part of left foot with fat layer exposed Type 2 diabetes mellitus with foot ulcer Presence of left artificial knee joint Patient's wound has improved in size and appearance since last clinic visit. He is going out of town and would like to stop the total contact cast. He also states he fell from [name] seated to standing position this week. I recommended Hydrofera Blue and [name] surgical shoe with peg assist. Another 1 was given in office today. Unfortunately he has had this wound before which did heal with the total contact cast but reopened once he had no offloading. He has orthotics however this seems to not be enough to keep the wound closed. He will likely will not heal without the total contact cast. He is aware of this. Will try other modalities however if wound does not heal Over the next few weeks will need to refer back to podiatry. Plan Follow-up Appointments: Return Appointment in 1 week. - Dr. Mikey Bussing Thursday 0800 06/18/2023 ***ROOM 9**** Return Appointment in 2 weeks. - Dr. Mikey Bussing Thursday 0800 06/25/2023 ****Room 8**** Anesthetic: (In clinic) Topical Lidocaine 5% applied to wound bed Bathing/ Shower/  Hygiene: May shower with protection but do not get wound dressing(s) wet. Protect dressing(s) with water repellant cover (for example, large plastic bag) or [name] cast cover and may then take shower. - Please do not get the T Contact cast wet on left leg. otal Edema Control - Lymphedema / SCD / Other: Elevate legs to the level of the heart or above for 30 minutes daily and/or when sitting for 3-4 times [name] day throughout the day. Avoid standing for long periods of time. Off-Loading: Open toe surgical shoe to: - left foot wear while walking and standing. No bare feet or just socks. Other: - Elevate feet when  sitting. Place [name] pillow behind the calf to off-load the feet. If so desired. WOUND #5: - Foot Wound Laterality: Plantar, Left Cleanser: Soap and Water 1 x Per Day/30 Days Discharge Instructions: May shower and wash wound with dial antibacterial soap and water prior to dressing change. Cleanser: Vashe 5.8 (oz) 1 x Per Day/30 Days Discharge Instructions: Cleanse the wound with Vashe prior to applying [name] clean dressing using gauze sponges, not tissue or cotton balls. Prim Dressing: Hydrofera Blue Ready Transfer Foam, 2.5x2.5 (in/in) 1 x Per Day/30 Days ary Discharge Instructions: Apply directly to wound bed as directed Secondary Dressing: Optifoam Non-Adhesive Dressing, 4x4 in 1 x Per Day/30 Days Discharge Instructions: Apply over primary dressing foam donut. Secondary Dressing: Woven Gauze Sponges 2x2 in 1 x Per Day/30 Days Discharge Instructions: Apply over primary dressing as directed. Secured With: Web designer, Sterile 4x75 (in/in) 1 x Per Day/30 Days Discharge Instructions: Secure with stretch gauze as directed. Secured With: Transpore Surgical T ape, 2x10 (in/yd) 1 x Per Day/30 Days Discharge Instructions: Secure dressing with tape as directed. 1. Hydrofera Blue 2. Peg assist with surgical shoe 3. Follow-up in 1 week Electronic Signature(s) Signed: 06/09/2023  4:51:26 PM By: Geralyn Corwin DO Entered By: Geralyn Corwin on 06/09/2023 09:11:50 [name] ([id number]) [id number] Page 7 of 9 -------------------------------------------------------------------------------- HxROS Details Patient Name: Date of Service: Joshua [name] 06/09/2023 8:15 [name] Medical Record Number: [id number] Patient Account Number: [id number] Date of Birth/Sex: Treating RN: 07/03/1951 (72 y.o. M) Primary Care Provider: Creola Corn Other Clinician: Referring Provider: Treating Provider/Extender: Octavia Bruckner in Treatment: 1 Information Obtained From Patient Constitutional Symptoms (General Health) Medical History: Past Medical History Notes: arthro knee surgery Eyes Medical History: Positive for: Cataracts - extraction bilat Negative for: Glaucoma; Optic Neuritis Ear/Nose/Mouth/Throat Medical History: Negative for: Chronic sinus problems/congestion; Middle ear problems Hematologic/Lymphatic Medical History: Negative for: Anemia; Hemophilia; Human Immunodeficiency Virus; Lymphedema; Sickle Cell Disease Respiratory Medical History: Negative for: Aspiration; Asthma; Chronic Obstructive Pulmonary Disease (COPD); Pneumothorax; Sleep Apnea; Tuberculosis Cardiovascular Medical History: Positive for: Hypertension Negative for: Angina; Arrhythmia; Congestive Heart Failure; Coronary Artery Disease; Deep Vein Thrombosis; Hypotension; Myocardial Infarction; Peripheral Arterial Disease; Peripheral Venous Disease; Phlebitis; Vasculitis Past Medical History Notes: h/o CVA , Gastrointestinal Medical History: Negative for: Cirrhosis ; Colitis; Crohns; Hepatitis [name]; Hepatitis B; Hepatitis C Endocrine Medical History: Positive for: Type II Diabetes Negative for: Type I Diabetes Time with diabetes: since 2010 Treated with: Insulin Blood sugar tested every day: Yes Tested : daily Blood sugar testing  results: Breakfast: 70 Genitourinary Medical History: Negative for: End Stage Renal Disease Immunological Medical History: Negative for: Lupus Erythematosus; Raynauds; Scleroderma ADAIR, LAUDERBACK [name] ([id number]) [id number] Page 8 of 9 Integumentary (Skin) Medical History: Negative for: History of Burn Musculoskeletal Medical History: Negative for: Gout; Rheumatoid Arthritis; Osteoarthritis; Osteomyelitis Neurologic Medical History: Positive for: Neuropathy Negative for: Dementia; Quadriplegia; Paraplegia; Seizure Disorder Past Medical History Notes: central tremors Oncologic Medical History: Negative for: Received Chemotherapy; Received Radiation Past Medical History Notes: h/o melanoma Psychiatric Medical History: Negative for: Anorexia/bulimia; Confinement Anxiety HBO Extended History Items Eyes: Cataracts Immunizations Pneumococcal Vaccine: Received Pneumococcal Vaccination: No Immunization Notes: unknown Implantable Devices No devices added Hospitalization / Surgery History Type of Hospitalization/Surgery appendectomy tonsillectomy vasectomy nasal septum surgery Family and Social History Diabetes: Yes - Maternal Grandparents; Former smoker; Alcohol Use: Moderate - 2 drinks weekly; Drug Use: No History; Caffeine Use: Daily; Financial Concerns: No; Food, Clothing or Shelter Needs: No; Support System  Lacking: No; Transportation Concerns: No Electronic Signature(s) Signed: 06/09/2023 4:51:26 PM By: Geralyn Corwin DO Entered By: Geralyn Corwin on 06/09/2023 08:49:54 -------------------------------------------------------------------------------- SuperBill Details Patient Name: Date of Service: Joshua [name]. 06/09/2023 Medical Record Number: [id number] Patient Account Number: [id number] Date of Birth/Sex: Treating RN: 1951-09-18 (72 y.o. Tammy Sours Primary Care Provider: Creola Corn Other Clinician: Referring  Provider: Treating Provider/Extender: Octavia Bruckner in Treatment: 1 EQUAN, COGBILL [name] ([id number]) [id number] Page 9 of 9 Diagnosis Coding ICD-10 Codes Code Description [phone] Non-pressure chronic ulcer of other part of left foot with fat layer exposed E11.621 Type 2 diabetes mellitus with foot ulcer Z96.652 Presence of left artificial knee joint Facility Procedures : CPT4 Code: [id number] Description: 99213 - WOUND CARE VISIT-LEV 3 EST PT Modifier: Quantity: 1 Physician Procedures : CPT4 Code Description Modifier [id number] 99213 - WC PHYS LEVEL 3 - EST PT ICD-10 Diagnosis Description L97.522 Non-pressure chronic ulcer of other part of left foot with fat layer exposed E11.621 Type 2 diabetes mellitus with foot ulcer Z96.652 Presence  of left artificial knee joint Quantity: 1 Electronic Signature(s) Signed: 06/09/2023 4:51:26 PM By: Geralyn Corwin DO Entered By: Geralyn Corwin on 06/09/2023 09:12:02

## 2023-06-10 NOTE — Progress Notes (Addendum)
KAVONTAE, WOLFGRAMM A ([id number]) [id number] Page 1 of 8 Visit Report for 06/09/2023 Arrival Information Details Patient Name: Date of Service: Joshua [name] 06/09/2023 8:15 A M Medical Record Number: [id number] Patient Account Number: [id number] Date of Birth/Sex: Treating RN: 02-24-1951 (72 y.o. Male) Shawn Stall Primary Care Nioka Thorington: Creola Corn Other Clinician: Referring Nazarene Bunning: Treating Alaiyah Bollman/Extender: Octavia Bruckner in Treatment: 1 Visit Information History Since Last Visit Added or deleted any medications: No Patient Arrived: Dan Humphreys Any new allergies or adverse reactions: No Arrival Time: 08:07 Had a fall or experienced change in Yes Accompanied By: self activities of daily living that may affect Transfer Assistance: None risk of falls: Patient Identification Verified: Yes Signs or symptoms of abuse/neglect since last visito No Secondary Verification Process Completed: Yes Hospitalized since last visit: No Patient Requires Transmission-Based Precautions: No Implantable device outside of the clinic excluding No Patient Has Alerts: Yes cellular tissue based products placed in the center Patient Alerts: ABI R 1.24 (05/22/23) since last visit: TBI R 0.81 (05/22/23) Has Dressing in Place as Prescribed: Yes ABI L Laird (05/22/23) Has Footwear/Offloading in Place as Prescribed: Yes TBI L 1.05 (05/22/23) Left: T Contact Cast otal ELIQUIS Pain Present Now: No Electronic Signature(s) Signed: 06/09/2023 6:16:33 PM By: Shawn Stall RN, BSN Entered By: Shawn Stall on 06/09/2023 05:07:45 -------------------------------------------------------------------------------- Clinic Level of Care Assessment Details Patient Name: Date of Service: Joshua Joshua Duty A. 06/09/2023 8:15 A M Medical Record Number: [id number] Patient Account Number: [id number] Date of Birth/Sex: Treating RN: 10-Mar-1951 (72 y.o. Male) Shawn Stall Primary Care  Janat Tabbert: Creola Corn Other Clinician: Referring Leland Staszewski: Treating Tristina Sahagian/Extender: Octavia Bruckner in Treatment: 1 Clinic Level of Care Assessment Items TOOL 4 Quantity Score X- 1 0 Use when only an EandM is performed on FOLLOW-UP visit ASSESSMENTS - Nursing Assessment / Reassessment X- 1 10 Reassessment of Co-morbidities (includes updates in patient status) X- 1 5 Reassessment of Adherence to Treatment Plan ASSESSMENTS - Wound and Skin A ssessment / Reassessment X - Simple Wound Assessment / Reassessment - one wound 1 5 []  - 0 Complex Wound Assessment / Reassessment - multiple wounds []  - 0 Dermatologic / Skin Assessment (not related to wound area) ASSESSMENTS - Focused Assessment X- 1 5 Circumferential Edema Measurements - multi extremities TREYVON, VENUTI A ([id number]) [id number] Page 2 of 8 []  - 0 Nutritional Assessment / Counseling / Intervention []  - 0 Lower Extremity Assessment (monofilament, tuning fork, pulses) []  - 0 Peripheral Arterial Disease Assessment (using hand held doppler) ASSESSMENTS - Ostomy and/or Continence Assessment and Care []  - 0 Incontinence Assessment and Management []  - 0 Ostomy Care Assessment and Management (repouching, etc.) PROCESS - Coordination of Care X - Simple Patient / Family Education for ongoing care 1 15 []  - 0 Complex (extensive) Patient / Family Education for ongoing care X- 1 10 Staff obtains Chiropractor, Records, T Results / Process Orders est []  - 0 Staff telephones HHA, Nursing Homes / Clarify orders / etc []  - 0 Routine Transfer to another Facility (non-emergent condition) []  - 0 Routine Hospital Admission (non-emergent condition) []  - 0 New Admissions / Manufacturing engineer / Ordering NPWT Apligraf, etc. , []  - 0 Emergency Hospital Admission (emergent condition) X- 1 10 Simple Discharge Coordination []  - 0 Complex (extensive) Discharge Coordination PROCESS -  Special Needs []  - 0 Pediatric / Minor Patient Management []  - 0 Isolation Patient Management []  - 0 Hearing / Language / Visual special needs []  - 0  Assessment of Community assistance (transportation, D/C planning, etc.) []  - 0 Additional assistance / Altered mentation []  - 0 Support Surface(s) Assessment (bed, cushion, seat, etc.) INTERVENTIONS - Wound Cleansing / Measurement X - Simple Wound Cleansing - one wound 1 5 []  - 0 Complex Wound Cleansing - multiple wounds X- 1 5 Wound Imaging (photographs - any number of wounds) []  - 0 Wound Tracing (instead of photographs) X- 1 5 Simple Wound Measurement - one wound []  - 0 Complex Wound Measurement - multiple wounds INTERVENTIONS - Wound Dressings X - Small Wound Dressing one or multiple wounds 1 10 []  - 0 Medium Wound Dressing one or multiple wounds []  - 0 Large Wound Dressing one or multiple wounds []  - 0 Application of Medications - topical []  - 0 Application of Medications - injection INTERVENTIONS - Miscellaneous []  - 0 External ear exam []  - 0 Specimen Collection (cultures, biopsies, blood, body fluids, etc.) []  - 0 Specimen(s) / Culture(s) sent or taken to Lab for analysis []  - 0 Patient Transfer (multiple staff / Nurse, adult / Similar devices) []  - 0 Simple Staple / Suture removal (25 or less) []  - 0 Complex Staple / Suture removal (26 or more) JARIOUS, BETANZOS A ([id number]) [phone].pdf Page 3 of 8 []  - 0 Hypo / Hyperglycemic Management (close monitor of Blood Glucose) []  - 0 Ankle / Brachial Index (ABI) - do not check if billed separately X- 1 5 Vital Signs Has the patient been seen at the hospital within the last three years: Yes Total Score: 90 Level Of Care: New/Established - Level 3 Electronic Signature(s) Signed: 06/09/2023 6:16:33 PM By: Shawn Stall RN, BSN Entered By: Shawn Stall on 06/09/2023  05:42:39 -------------------------------------------------------------------------------- Encounter Discharge Information Details Patient Name: Date of Service: Joshua Duel A. 06/09/2023 8:15 A M Medical Record Number: [id number] Patient Account Number: [id number] Date of Birth/Sex: Treating RN: [birth year] (72 y.o. Male) Shawn Stall Primary Care Marchelle Rinella: Creola Corn Other Clinician: Referring Madesyn Ast: Treating Daleysa Kristiansen/Extender: Octavia Bruckner in Treatment: 1 Encounter Discharge Information Items Discharge Condition: Stable Ambulatory Status: Walker Discharge Destination: Home Transportation: Private Auto Accompanied By: self Schedule Follow-up Appointment: Yes Clinical Summary of Care: Electronic Signature(s) Signed: 06/09/2023 6:16:33 PM By: Shawn Stall RN, BSN Entered By: Shawn Stall on 06/09/2023 05:43:11 -------------------------------------------------------------------------------- Lower Extremity Assessment Details Patient Name: Date of Service: Joshua Joshua Duty A. 06/09/2023 8:15 A M Medical Record Number: [id number] Patient Account Number: [id number] Date of Birth/Sex: Treating RN: November 06, 1951 (72 y.o. Male) Shawn Stall Primary Care Shakelia Scrivner: Creola Corn Other Clinician: Referring Leanny Moeckel: Treating Brittne Kawasaki/Extender: Octavia Bruckner in Treatment: 1 Edema Assessment Assessed: [Left: No] [Right: No] Edema: [Left: Ye] [Right: s] Calf Left: Right: Point of Measurement: 48 cm From Medial Instep 38 cm Ankle Left: Right: Point of Measurement: 38 cm From Medial Instep 24 cm Vascular Assessment Left: [[id number] Page 4 of 8Right:] Pulses: Dorsalis Pedis Palpable: [[id number] Page 4 of 8Yes] Extremity colors, hair growth, and conditions: Extremity Color: [phone].pdf Page 4 of 8Normal] Hair Growth on Extremity:  [[id number] Page 4 of 8Yes] Temperature of Extremity: [phone].pdf Page 4 of 8Warm] Capillary Refill: [phone].pdf Page 4 of 8< 3 seconds] Dependent Rubor: [phone].pdf Page 4 of 8No] Blanched when Elevated: [phone].pdf Page 4 of 8No No] Toe Nail Assessment Left: Right: Thick: No Discolored: No Deformed: No Improper Length and Hygiene: No Electronic Signature(s) Signed: 06/09/2023 6:16:33 PM By: Shawn Stall RN, BSN Entered By: Shawn Stall on 06/09/2023 05:15:44 -------------------------------------------------------------------------------- Multi Wound  Chart Details Patient Name: Date of Service: Joshua [name] 06/09/2023 8:15 A M Medical Record Number: [id number] Patient Account Number: [id number] Date of Birth/Sex: Treating RN: [birth year] (72 y.o. Male) Primary Care Jazsmin Couse: Creola Corn Other Clinician: Referring Maleeha Halls: Treating Zelina Jimerson/Extender: Octavia Bruckner in Treatment: 1 Vital Signs Height(in): 76 Capillary Blood Glucose(mg/dl): 213 Weight(lbs): 086 Pulse(bpm): 111 Body Mass Index(BMI): 30.4 Blood Pressure(mmHg): 111/75 Temperature(F): 98.9 Respiratory Rate(breaths/min): 20 [5:Photos:] [N/A:N/A] Left, Plantar Foot N/A N/A Wound Location: Shear/Friction N/A N/A Wounding Event: Diabetic Wound/Ulcer of the Lower N/A N/A Primary Etiology: Extremity Cataracts, Hypertension, Type II N/A N/A Comorbid History: Diabetes, Neuropathy 05/08/2023 N/A N/A Date Acquired: 1 N/A N/A Weeks of Treatment: Open N/A N/A Wound Status: No N/A N/A Wound Recurrence: 0.2x0.2x0.1 N/A N/A Measurements L x W x D (cm) 0.031 N/A N/A A (cm) : rea 0.003 N/A N/A Volume (cm) : 91.20% N/A N/A % Reduction in Area: 91.40% N/A N/A % Reduction in Volume: Grade 1 N/A N/A Classification: AAREON, DRASS A ([id number])  [id number] Page 5 of 8 Small N/A N/A Exudate Amount: Serosanguineous N/A N/A Exudate Type: red, brown N/A N/A Exudate Color: Distinct, outline attached N/A N/A Wound Margin: Large (67-100%) N/A N/A Granulation Amount: Red, Pink N/A N/A Granulation Quality: None Present (0%) N/A N/A Necrotic Amount: Fat Layer (Subcutaneous Tissue): Yes N/A N/A Exposed Structures: Fascia: No Tendon: No Muscle: No Joint: No Bone: No Large (67-100%) N/A N/A Epithelialization: Callus: Yes N/A N/A Periwound Skin Texture: Excoriation: No Induration: No Crepitus: No Rash: No Scarring: No Maceration: Yes N/A N/A Periwound Skin Moisture: Dry/Scaly: No Atrophie Blanche: No N/A N/A Periwound Skin Color: Cyanosis: No Ecchymosis: No Erythema: No Hemosiderin Staining: No Mottled: No Pallor: No Rubor: No Treatment Notes Wound #5 (Foot) Wound Laterality: Plantar, Left Cleanser Soap and Water Discharge Instruction: May shower and wash wound with dial antibacterial soap and water prior to dressing change. Vashe 5.8 (oz) Discharge Instruction: Cleanse the wound with Vashe prior to applying a clean dressing using gauze sponges, not tissue or cotton balls. Peri-Wound Care Topical Primary Dressing Hydrofera Blue Ready Transfer Foam, 2.5x2.5 (in/in) Discharge Instruction: Apply directly to wound bed as directed Secondary Dressing Optifoam Non-Adhesive Dressing, 4x4 in Discharge Instruction: Apply over primary dressing foam donut. Woven Gauze Sponges 2x2 in Discharge Instruction: Apply over primary dressing as directed. Secured With Conforming Stretch Gauze Bandage Roll, Sterile 4x75 (in/in) Discharge Instruction: Secure with stretch gauze as directed. Transpore Surgical Tape, 2x10 (in/yd) Discharge Instruction: Secure dressing with tape as directed. Compression Wrap Compression Stockings Add-Ons Electronic Signature(s) Signed: 06/09/2023 4:51:26 PM By: Geralyn Corwin DO Entered By: Geralyn Corwin on 06/09/2023 05:45:57 [name] ([id number]) [id number] Page 6 of 8 -------------------------------------------------------------------------------- Multi-Disciplinary Care Plan Details Patient Name: Date of Service: Joshua [name] 06/09/2023 8:15 A M Medical Record Number: [id number] Patient Account Number: [id number] Date of Birth/Sex: Treating RN: 04-11-1951 (72 y.o. Male) Shawn Stall Primary Care Antron Seth: Creola Corn Other Clinician: Referring Michaela Broski: Treating Kaisen Ackers/Extender: Octavia Bruckner in Treatment: 1 Active Inactive Electronic Signature(s) Signed: 07/20/2023 1:06:38 PM By: Shawn Stall RN, BSN Previous Signature: 06/09/2023 6:16:33 PM Version By: Shawn Stall RN, BSN Entered By: Shawn Stall on 07/20/2023 10:06:37 -------------------------------------------------------------------------------- Pain Assessment Details Patient Name: Date of Service: Joshua Duel A. 06/09/2023 8:15 A M Medical Record Number: [id number] Patient Account Number: [id number] Date of Birth/Sex: Treating RN: 1951/11/07 (72 y.o. Male) Shawn Stall Primary Care Marlys Stegmaier: Creola Corn Other Clinician: Referring  Kirke Breach: Treating Tayla Panozzo/Extender: Octavia Bruckner in Treatment: 1 Active Problems Location of Pain Severity and Description of Pain Patient Has Paino No Site Locations Pain Management and Medication Current Pain Management: Electronic Signature(s) Signed: 06/09/2023 6:16:33 PM By: Shawn Stall RN, BSN Entered By: Shawn Stall on 06/09/2023 05:15:25 [name] ([id number]) [id number] Page 7 of 8 -------------------------------------------------------------------------------- Patient/Caregiver Education Details Patient Name: Date of Service: Joshua [name] 7/16/2024andnbsp8:15 A M Medical Record Number:  [id number] Patient Account Number: [id number] Date of Birth/Gender: Treating RN: 1951/01/01 (72 y.o. Male) Shawn Stall Primary Care Physician: Creola Corn Other Clinician: Referring Physician: Treating Physician/Extender: Octavia Bruckner in Treatment: 1 Education Assessment Education Provided To: Patient Education Topics Provided Wound/Skin Impairment: Handouts: Caring for Your Ulcer Methods: Explain/Verbal Responses: Reinforcements needed Electronic Signature(s) Signed: 06/09/2023 6:16:33 PM By: Shawn Stall RN, BSN Entered By: Shawn Stall on 06/09/2023 05:19:00 -------------------------------------------------------------------------------- Wound Assessment Details Patient Name: Date of Service: Joshua Duel A. 06/09/2023 8:15 A M Medical Record Number: [id number] Patient Account Number: [id number] Date of Birth/Sex: Treating RN: [birth year] (72 y.o. Male) Shawn Stall Primary Care Destinee Taber: Creola Corn Other Clinician: Referring Tawn Fitzner: Treating Kaisha Wachob/Extender: Octavia Bruckner in Treatment: 1 Wound Status Wound Number: 5 Primary Etiology: Diabetic Wound/Ulcer of the Lower Extremity Wound Location: Left, Plantar Foot Wound Status: Open Wounding Event: Shear/Friction Comorbid History: Cataracts, Hypertension, Type II Diabetes, Neuropathy Date Acquired: 05/08/2023 Weeks Of Treatment: 1 Clustered Wound: No Photos Wound Measurements Length: (cm) 0.2 CARLO, HOPKINS A ([id number]) Width: (cm) 0.2 Depth: (cm) 0.1 Area: (cm) 0. Volume: (cm) 0. % Reduction in Area: 91.2% [phone].pdf Page 8 of 8 % Reduction in Volume: 91.4% Epithelialization: Large (67-100%) 031 Tunneling: No 003 Undermining: No Wound Description Classification: Grade 1 Wound Margin: Distinct, outline attached Exudate Amount: Small Exudate Type: Serosanguineous Exudate Color: red, brown Foul Odor After Cleansing:  No Slough/Fibrino No Wound Bed Granulation Amount: Large (67-100%) Exposed Structure Granulation Quality: Red, Pink Fascia Exposed: No Necrotic Amount: None Present (0%) Fat Layer (Subcutaneous Tissue) Exposed: Yes Tendon Exposed: No Muscle Exposed: No Joint Exposed: No Bone Exposed: No Periwound Skin Texture Texture Color No Abnormalities Noted: No No Abnormalities Noted: No Callus: Yes Atrophie Blanche: No Crepitus: No Cyanosis: No Excoriation: No Ecchymosis: No Induration: No Erythema: No Rash: No Hemosiderin Staining: No Scarring: No Mottled: No Pallor: No Moisture Rubor: No No Abnormalities Noted: No Dry / Scaly: No Maceration: Yes Electronic Signature(s) Signed: 06/09/2023 6:16:33 PM By: Shawn Stall RN, BSN Entered By: Shawn Stall on 06/09/2023 05:20:31 -------------------------------------------------------------------------------- Vitals Details Patient Name: Date of Service: Joshua Duel A. 06/09/2023 8:15 A M Medical Record Number: [id number] Patient Account Number: [id number] Date of Birth/Sex: Treating RN: 1951-10-29 (72 y.o. Male) Shawn Stall Primary Care Cassie Henkels: Creola Corn Other Clinician: Referring English Craighead: Treating Marliyah Reid/Extender: Octavia Bruckner in Treatment: 1 Vital Signs Time Taken: 08:14 Temperature (F): 98.9 Height (in): 76 Pulse (bpm): 111 Weight (lbs): 250 Respiratory Rate (breaths/min): 20 Body Mass Index (BMI): 30.4 Blood Pressure (mmHg): 111/75 Capillary Blood Glucose (mg/dl): 277 Reference Range: 80 - 120 mg / dl Electronic Signature(s) Signed: 06/09/2023 6:16:33 PM By: Shawn Stall RN, BSN Entered By: Shawn Stall on 06/09/2023 05:15:22

## 2023-06-10 NOTE — Progress Notes (Signed)
JASE, REEP A ([id number]) [phone].pdf Page 1 of 1 Visit Report for 06/09/2023 Fall Risk Assessment Details Patient Name: Date of Service: [name] 06/09/2023 8:15 A M Medical Record Number: [id number] Patient Account Number: [id number] Date of Birth/Sex: Treating RN: 03-12-1951 (72 y.o. Harlon Flor, Yvonne Kendall Primary Care Whitfield Dulay: Creola Corn Other Clinician: Referring Vasco Chong: Treating Erique Kaser/Extender: Octavia Bruckner in Treatment: 1 Fall Risk Assessment Items Have you had 2 or more falls in the last 12 monthso 0 Yes Have you had any fall that resulted in injury in the last 12 monthso 0 No FALLS RISK SCREEN History of falling - immediate or within 3 months 25 Yes Secondary diagnosis (Do you have 2 or more medical diagnoseso) 0 No Ambulatory aid None/bed rest/wheelchair/nurse 0 No Crutches/cane/walker 15 Yes Furniture 0 No Intravenous therapy Access/Saline/Heparin Lock 0 No Gait/Transferring Normal/ bed rest/ wheelchair 0 No Weak (short steps with or without shuffle, stooped but able to lift head while walking, may seek 10 Yes support from furniture) Impaired (short steps with shuffle, may have difficulty arising from chair, head down, impaired 0 No balance) Mental Status Oriented to own ability 0 Yes Notes per patient fell twice this week. Electronic Signature(s) Signed: 06/09/2023 6:16:33 PM By: Shawn Stall RN, BSN Entered By: Shawn Stall on 06/09/2023 08:08:19

## 2023-06-12 DIAGNOSIS — L97529 Non-pressure chronic ulcer of other part of left foot with unspecified severity: Secondary | ICD-10-CM | POA: Diagnosis not present

## 2023-06-12 DIAGNOSIS — I422 Other hypertrophic cardiomyopathy: Secondary | ICD-10-CM | POA: Diagnosis not present

## 2023-06-12 DIAGNOSIS — G4733 Obstructive sleep apnea (adult) (pediatric): Secondary | ICD-10-CM | POA: Diagnosis not present

## 2023-06-12 DIAGNOSIS — R600 Localized edema: Secondary | ICD-10-CM | POA: Diagnosis not present

## 2023-06-12 DIAGNOSIS — E1121 Type 2 diabetes mellitus with diabetic nephropathy: Secondary | ICD-10-CM | POA: Diagnosis not present

## 2023-06-12 DIAGNOSIS — N1831 Chronic kidney disease, stage 3a: Secondary | ICD-10-CM | POA: Diagnosis not present

## 2023-06-12 DIAGNOSIS — R251 Tremor, unspecified: Secondary | ICD-10-CM | POA: Diagnosis not present

## 2023-06-12 DIAGNOSIS — R270 Ataxia, unspecified: Secondary | ICD-10-CM | POA: Diagnosis not present

## 2023-06-23 DIAGNOSIS — M25512 Pain in left shoulder: Secondary | ICD-10-CM | POA: Diagnosis not present

## 2023-06-24 NOTE — Progress Notes (Unsigned)
Cardiology Office Note:  .   Date:  06/25/2023  ID:  Joshua [name], DOB [birth year], MRN [id number] PCP: Joshua Corn, MD  [facility] Providers Cardiologist:  Little Ishikawa, MD    History of Present Illness: Joshua [name] is a 72 y.o. male with past medical history of HCM, hypertension, CVA, PE after knee surgery, type 2 diabetes, essential tremor s/p DBS and OSA on CPAP.  He presents today for follow-up for HCM and shortness of breath.   In October 2020 he was hospitalized in the setting of acute CVA.  CTA of the head and neck showed subclavian artery thrombus.  MRI of the brain showed right internal capsule infarct.  Echocardiogram showed no cardiogenic source of embolism.  He was started on Eliquis.  Cardiac monitor showed no evidence of atrial fibrillation.  Echocardiogram was notable for an incidental finding of asymmetric basal septal hypertrophy.  He was referred to cardiology.  Cardiac MRI showed basal septal hypertrophy measuring up to 20 mm (lateral weall 12mm), consistent with hypertrophic cardiomyopathy.  PYP scan showed no evidence of amyloid.  Cardiac monitor showed no VT, occasional PVCs.  Repeat monitor in 07/2020 showed one 4 beat run of NSVT, 6 runs of SVT, longest lasting 16 beats, occasional PVCs.  His only syncopal episode occurred with his CVA years ago.  He was hospitalized in March 2022 with shortness of breath in the setting of pericardial effusion.  Follow-up echocardiogram in 02/2021 showed resolution of pericardial effusion, EF 55 to 60%, G1 DD, moderate LVH, strain abnormality suggestive of cardiac amyloidosis.  Lexiscan Myoview in 01/2021 showed no evidence of ischemia, EF 53%.  Repeat PYP in 05/2021 showed no evidence of amyloidosis, SPEP/UPEP/light chains without evidence of AL amyloid.  He was seen in office on 02/12/2022 and was stable from a cardiac standpoint.  He did note ongoing dyspnea with minimal exertion.  Repeat echocardiogram showed EF 66 5%,  normal LV function, no RWMA, moderate concentric LVH, indeterminate diastolic parameters, mild dilation of ascending aorta at 42 mm, no significant valvular abnormalities, no evidence of pericardial effusion.  He also had ABIs that were normal in setting of leg pain.  [name] was last seen in office on 07/30/2022 for follow-up.  He reported a 3-week history of worsening dyspnea on exertion, noted shortness of breath with minimal exertion.  He denied any chest pain, weight gain, worsening edema, PND or orthopnea.  He reported very little energy and stated that he felt "terrible".  His chronic bilateral ankle edema was stable.  It was recommended that he have a CBC, CMET, BNP and chest x-ray and limited echo given history of pericardial effusion.  His lab results showed stable kidney function, normal electrolytes and a normal BNP.  There was no evidence of anemia nor infection.  Repeat limited echocardiogram showed EF 55%, LV with normal function, no RWMA, there was moderate concentric left ventricular hypertrophy and G1 DD noted on prior echos.  There was mild dilation of the ascending aorta measuring 43 mm.  On 01/23/2023 he presented to the emergency room for paresthesias.  His neurologist recommended he present to the emergency room due to previous high risk history of stroke.  His head CT indicated no acute infarction, hemorrhage, hydrocephalus or mass or lesion.  He reports left foot diabetic foot ulcer that is being monitored by wound care, started in December.  Per patient's wife wound has nearly healed.  ABIs 04/2023 showed normal right, noncompressible left  with normal toe-brachial index.   Today he presents for follow-up, reports further worsened shortness of breath and dyspnea on exertion.  Reports that starting in January his DOE worsened, taking longer to recover.  He also reports increased fatigue.  He has had no chest pain with exertion or at rest.  He denies any weight gain, worsening edema, PND  orthopnea.  He denies any recent long distance travel and reports adherence to Eliquis with no bleeding issues.  Denies any recent illness, cough, fever or chills.  ROS: Today he  denies chest pain, lower extremity edema, palpitations, melena, hematuria, hemoptysis, diaphoresis, weakness, presyncope, syncope, orthopnea, and PND.   Studies Reviewed: Marland Kitchen   EKG Interpretation Date/Time:  Thursday June 25 2023 08:41:06 EDT Ventricular Rate:  75 PR Interval:  258 QRS Duration:  122 QT Interval:  380 QTC Calculation: 424 R Axis:   -58  Text Interpretation: Sinus rhythm with 1st degree A-V block Left axis deviation Minimal voltage criteria for LVH, may be normal variant ( Cornell product ) No acute changes Confirmed by Reather Littler [phone]) on 06/25/2023 10:17:13 AM   Cardiac Studies & Procedures     STRESS TESTS  MYOCARDIAL PERFUSION IMAGING 02/12/2021  Narrative  Nuclear stress EF: 52%.  There was no ST segment deviation noted during stress.  This is a low risk study with no evidence of ischemia.  The left ventricular ejection fraction is mildly decreased (45-54%). Visually, systolic function appears normal.   ECHOCARDIOGRAM  ECHOCARDIOGRAM LIMITED 08/14/2022  Narrative ECHOCARDIOGRAM LIMITED REPORT    [name] Date of Exam: 08/14/2022 Medical Rec #:  [id number]       Height:       76.0 in Accession #:    [id number]      Weight:       253.8 lb Date of Birth:  [birth year]       BSA:          2.451 m Patient Age:    71 years        BP:           112/62 mmHg Patient Gender: M               HR:           74 bpm. Exam Location:  [address]  Procedure: 2D Echo, Limited Echo, Limited Color Doppler, Cardiac Doppler and Intracardiac Opacification Agent  Indications:    R06.02 Shortness of breath  History:        Patient has prior history of Echocardiogram examinations, most recent 02/21/2022. Arrythmias:SVT; Risk Factors:Hypertension, Diabetes and  Dyslipidemia. Obstructive sleep apnea-CPAP. Pulmonary emboli. HOCM.  Sonographer:    NaTashia Rodgers-Jones RDCS Referring Phys: [id number] EMILY C MONGE  IMPRESSIONS   1. Left ventricular ejection fraction, by estimation, is 55%. The left ventricle has normal function. The left ventricle has no regional wall motion abnormalities. There is moderate concentric left ventricular hypertrophy. Left ventricular diastolic parameters are consistent with Grade I diastolic dysfunction (impaired relaxation). 2. Right ventricular systolic function is normal. The right ventricular size is normal. There is normal pulmonary artery systolic pressure. The estimated right ventricular systolic pressure is 28.4 mmHg. 3. The mitral valve is normal in structure. No evidence of mitral valve regurgitation. No evidence of mitral stenosis. 4. The aortic valve is tricuspid. There is moderate calcification of the aortic valve. Aortic valve regurgitation is not visualized. Aortic valve sclerosis/calcification is present, without any evidence of aortic stenosis. 5.  Aortic dilatation noted. There is mild dilatation of the ascending aorta, measuring 43 mm. 6. The inferior vena cava is normal in size with greater than 50% respiratory variability, suggesting right atrial pressure of 3 mmHg.  FINDINGS Left Ventricle: Left ventricular ejection fraction, by estimation, is 55%. The left ventricle has normal function. The left ventricle has no regional wall motion abnormalities. Definity contrast agent was given IV to delineate the left ventricular endocardial borders. The left ventricular internal cavity size was normal in size. There is moderate concentric left ventricular hypertrophy. Left ventricular diastolic parameters are consistent with Grade I diastolic dysfunction (impaired relaxation).  Right Ventricle: The right ventricular size is normal. No increase in right ventricular wall thickness. Right ventricular systolic function is  normal. There is normal pulmonary artery systolic pressure. The tricuspid regurgitant velocity is 2.52 m/s, and with an assumed right atrial pressure of 3 mmHg, the estimated right ventricular systolic pressure is 28.4 mmHg.  Left Atrium: Left atrial size was normal in size.  Right Atrium: Right atrial size was normal in size.  Pericardium: Trivial pericardial effusion is present.  Mitral Valve: The mitral valve is normal in structure. No evidence of mitral valve stenosis.  Tricuspid Valve: The tricuspid valve is normal in structure. Tricuspid valve regurgitation is trivial.  Aortic Valve: The aortic valve is tricuspid. There is moderate calcification of the aortic valve. Aortic valve regurgitation is not visualized. Aortic valve sclerosis/calcification is present, without any evidence of aortic stenosis. Aortic valve mean gradient measures 5.0 mmHg. Aortic valve peak gradient measures 8.9 mmHg. Aortic valve area, by VTI measures 4.13 cm.  Pulmonic Valve: The pulmonic valve was normal in structure. Pulmonic valve regurgitation is not visualized.  Aorta: Aortic dilatation noted. There is mild dilatation of the ascending aorta, measuring 43 mm.  Venous: The inferior vena cava is normal in size with greater than 50% respiratory variability, suggesting right atrial pressure of 3 mmHg.  IAS/Shunts: No atrial level shunt detected by color flow Doppler.  LEFT VENTRICLE PLAX 2D LVIDd:         5.10 cm   Diastology LVIDs:         3.00 cm   LV e' medial:    6.30 cm/s LV PW:         1.50 cm   LV E/e' medial:  8.5 LV IVS:        1.50 cm   LV e' lateral:   8.10 cm/s LVOT diam:     2.30 cm   LV E/e' lateral: 6.6 LV SV:         120 LV SV Index:   49 LVOT Area:     4.15 cm   RIGHT VENTRICLE             IVC RV Basal diam:  4.30 cm     IVC diam: 1.60 cm RV S prime:     11.80 cm/s TAPSE (M-mode): 2.5 cm  LEFT ATRIUM             Index        RIGHT ATRIUM           Index LA diam:        5.00 cm  2.04 cm/m   RA Area:     17.80 cm LA Vol (A2C):   53.7 ml 21.91 ml/m  RA Volume:   53.90 ml  21.99 ml/m LA Vol (A4C):   63.2 ml 25.78 ml/m LA Biplane Vol: 59.1 ml 24.11 ml/m AORTIC VALVE  AV Area (Vmax):    3.80 cm AV Area (Vmean):   3.92 cm AV Area (VTI):     4.13 cm AV Vmax:           149.50 cm/s AV Vmean:          100.350 cm/s AV VTI:            0.290 m AV Peak Grad:      8.9 mmHg AV Mean Grad:      5.0 mmHg LVOT Vmax:         136.67 cm/s LVOT Vmean:        94.600 cm/s LVOT VTI:          0.288 m LVOT/AV VTI ratio: 0.99  AORTA Ao Root diam: 3.90 cm Ao Asc diam:  4.30 cm  MITRAL VALVE               TRICUSPID VALVE MV Area (PHT): 2.85 cm    TR Peak grad:   25.4 mmHg MV Decel Time: 266 msec    TR Vmax:        252.00 cm/s MV E velocity: 53.70 cm/s MV A velocity: 84.90 cm/s  SHUNTS MV E/A ratio:  0.63        Systemic VTI:  0.29 m Systemic Diam: 2.30 cm  Dalton McleanMD Electronically signed by Wilfred Lacy Signature Date/Time: 08/14/2022/2:12:52 PM    Final   TEE  ECHO TEE 04/23/2021  Narrative TRANSESOPHOGEAL ECHO REPORT    [name] Date of Exam: 04/23/2021 Medical Rec #:  [id number]       Height:       76.0 in Accession #:    [id number]      Weight:       255.3 lb Date of Birth:  11/19/1951       BSA:          2.457 m Patient Age:    69 years        BP:           129/80 mmHg Patient Gender: M               HR:           63 bpm. Exam Location:  Inpatient  Procedure: Transesophageal Echo and Color Doppler  Indications:     Bacteremia  History:         Patient has prior history of Echocardiogram examinations, most recent 03/08/2021.  Sonographer:     Margreta Journey Referring Phys:  [id number] LAURA R INGOLD Diagnosing Phys: Kristeen Miss MD  PROCEDURE: After discussion of the risks and benefits of a TEE, an informed consent was obtained from the patient. The transesophogeal probe was passed without difficulty through the esophogus  of the patient. Imaged were obtained with the patient in a left lateral decubitus position. Sedation performed by different physician. The patient was monitored while under deep sedation. Anesthestetic sedation was provided intravenously by Anesthesiology: 202mg  of Propofol. Image quality was adequate. The patient developed no complications during the procedure.  IMPRESSIONS   1. Left ventricular ejection fraction, by estimation, is 60 to 65%. The left ventricle has normal function. 2. Right ventricular systolic function is normal. The right ventricular size is normal. 3. No left atrial/left atrial appendage thrombus was detected. 4. Systolic anterior motion of the mitral valve leaflets is present . The mitral valve is normal in structure. No evidence of mitral valve regurgitation. 5. The aortic valve is normal  in structure. Aortic valve regurgitation is not visualized.  FINDINGS Left Ventricle: Left ventricular ejection fraction, by estimation, is 60 to 65%. The left ventricle has normal function. The left ventricular internal cavity size was normal in size.  Right Ventricle: The right ventricular size is normal. Right vetricular wall thickness was not well visualized. Right ventricular systolic function is normal.  Left Atrium: Left atrial size was normal in size. No left atrial/left atrial appendage thrombus was detected.  Right Atrium: Right atrial size was normal in size.  Pericardium: There is no evidence of pericardial effusion.  Mitral Valve: Systolic anterior motion of the mitral valve leaflets is present. The mitral valve is normal in structure. No evidence of mitral valve regurgitation. There is no evidence of mitral valve vegetation.  Tricuspid Valve: The tricuspid valve is normal in structure. Tricuspid valve regurgitation is trivial.  Aortic Valve: The aortic valve is normal in structure. Aortic valve regurgitation is not visualized. There is no evidence of aortic valve  vegetation.  Pulmonic Valve: The pulmonic valve was grossly normal. Pulmonic valve regurgitation is mild.  Aorta: The aortic root, ascending aorta and aortic arch are all structurally normal, with no evidence of dilitation or obstruction.  IAS/Shunts: The atrial septum is grossly normal.  Kristeen Miss MD Electronically signed by Kristeen Miss MD Signature Date/Time: 04/23/2021/5:22:19 PM    Final   MONITORS  LONG TERM MONITOR (3-14 DAYS) 08/22/2020  Narrative  One 4 beat run of NSVT  6 runs of SVT, longest lasting 16 beats  Occasional PVCs (3.9%)  8 days of data recorded on Zio monitor. Patient had a min HR of 62 bpm, max HR of 162 bpm, and avg HR of 86 bpm. Predominant underlying rhythm was Sinus Rhythm. No atrial fibrillation, high degree block, or pauses noted. One 4 beat run of NSVT.  6 runs of SVT, longest lasting 16 beats.  Isolated atrial ectopy was rare (<1%).  Isolated ventricular ectopy was occasional (3.9%).  There were 0 triggered events.   CT SCANS  CT CARDIAC SCORING (SELF PAY ONLY) 02/21/2021  Addendum 02/21/2021 12:26 PM ADDENDUM REPORT: 02/21/2021 12:24  CLINICAL DATA:  Cardiovascular Disease Risk stratification  EXAM: Coronary Calcium Score  TECHNIQUE: A gated, non-contrast computed tomography scan of the heart was performed using 3mm slice thickness. Axial images were analyzed on a dedicated workstation. Calcium scoring of the coronary arteries was performed using the Agatston method.  FINDINGS: Coronary arteries: Normal origins.  Coronary Calcium Score:  Left main: 0  Left anterior descending artery: 309  Left circumflex artery: 0  Right coronary artery: 117  Total: 588  Percentile: 68th  Pericardium: Normal.  Ascending Aorta: Tortuous aorta that appears dilated on this non-contast study. Calcification of the aortic root.  Non-cardiac: See separate report from [facility].  IMPRESSION: Coronary calcium score of 588.  This was 68th percentile for age-, race-, and sex-matched controls.  Tortuous aorta which appears dilated on this non-contrast study. Consider dedicated imaging with contrast if indicated.  RECOMMENDATIONS: Coronary artery calcium (CAC) score is a strong predictor of incident coronary heart disease (CHD) and provides predictive information beyond traditional risk factors. CAC scoring is reasonable to use in the decision to withhold, postpone, or initiate statin therapy in intermediate-risk or selected borderline-risk asymptomatic adults (age 35-75 years and LDL-C >=70 to <190 mg/dL) who do not have diabetes or established atherosclerotic cardiovascular disease (ASCVD).* In intermediate-risk (10-year ASCVD risk >=7.5% to <20%) adults or selected borderline-risk (10-year ASCVD risk >=5% to <7.5%) adults  in whom a CAC score is measured for the purpose of making a treatment decision the following recommendations have been made:  If CAC=0, it is reasonable to withhold statin therapy and reassess in 5 to 10 years, as long as higher risk conditions are absent (diabetes mellitus, family history of premature CHD in first degree relatives (males <55 years; females <65 years), cigarette smoking, or LDL >=190 mg/dL).  If CAC is 1 to 99, it is reasonable to initiate statin therapy for patients >=56 years of age.  If CAC is >=100 or >=75th percentile, it is reasonable to initiate statin therapy at any age.  Cardiology referral should be considered for patients with CAC scores >=400 or >=75th percentile.  *2018 AHA/ACC/AACVPR/AAPA/ABC/ACPM/ADA/AGS/APhA/ASPC/NLA/PCNA Guideline on the Management of Blood Cholesterol: A Report of the American College of Cardiology/American Heart Association Task Force on Clinical Practice Guidelines. J Am Coll Cardiol. 2019;73(24):3168-3209.  Zoila Shutter, MD   Electronically Signed By: Chrystie Nose M.D. On: 02/21/2021  12:24  Narrative EXAM: OVER-READ INTERPRETATION  CT CHEST  The following report is an over-read performed by radiologist Dr. Jeronimo Greaves of [facility] on 02/21/2021. This over-read does not include interpretation of cardiac or coronary anatomy or pathology. The calcium score interpretation by the cardiologist is attached.  COMPARISON:  01/29/2021 chest radiograph chest radiograph. CTA chest 04/09/2017.  FINDINGS: Vascular: Tortuous thoracic aorta.  Aortic atherosclerosis.  Mediastinum/Nodes: No imaged thoracic adenopathy.  Lungs/Pleura: No pleural fluid. Right lower lobe calcified granuloma.  Upper Abdomen: Normal imaged portions of the liver, spleen, stomach. Incompletely imaged 2.3 cm gallstone.  Musculoskeletal: Diffuse idiopathic skeletal hyperostosis again identified. Bone island involving the lower thoracic spine.  IMPRESSION: 1. No acute findings in the imaged extracardiac chest. 2. Cholelithiasis. 3. Aortic Atherosclerosis (ICD10-I70.0).  Electronically Signed: By: Jeronimo Greaves M.D. On: 02/21/2021 09:53   CARDIAC MRI  MR CARDIAC MORPHOLOGY W WO CONTRAST 10/05/2019  Narrative CLINICAL DATA:  Evaluate for HCM  EXAM: CARDIAC MRI  TECHNIQUE: The patient was scanned on a 1.5 Tesla Siemens magnet. A dedicated cardiac coil was used. Functional imaging was done using Fiesta sequences. 2,3, and 4 chamber views were done to assess for RWMA's. Modified Simpson's rule using a short axis stack was used to calculate an ejection fraction on a dedicated work [employer]. The patient received 10 cc of Gadavist. After 10 minutes inversion recovery sequences were used to assess for infiltration and scar tissue.  CONTRAST:  10 cc  of Gadavist  FINDINGS: Left ventricle:  LV EF: 69% (Normal 56-78%)  - Normal size  - Hyperdynamic systolic function  - Basal septal hypertrophy measuring up to 20mm (lateral wall 12 mm)  - Basal  inferolateral midwall LGE.  Patchy basal septal LGE  Absolute volumes:  LV EDV: (Normal 77-195 mL)  LV ESV: 56mL (Normal 19-72 mL)  LV SV: (Normal 51-133 mL)  CO: 10L/min (Normal 2.8-8.8 L/min)  Indexed volumes:  LV EDV: 39mL/sq-m (Normal 47-92 mL/sq-m)  LV ESV: 22mL/sq-m (Normal 13-30 mL/sq-m)  LV SV: 49mL/sq-m (Normal 32-62 mL/sq-m)  CI: 4.0L/min/sq-m (Normal 1.7-4.2 L/min/sq-m)  Right ventricle: Normal size and systolic function  RV EF:  61% (Normal 47-74%)  Absolute volumes:  RV EDV: (Normal 88-227 mL)  RV ESV: 71mL (Normal 23-103 mL)  RV SV: (Normal 52-138 mL)  CO: 9.3L/min (Normal 2.8-8.8 L/min)  Indexed volumes:  RV EDV: 77mL/sq-m (Normal 55-105 mL/sq-m)  RV ESV: 27mL/sq-m (Normal 15-43 mL/sq-m)  RV SV: 46mL/sq-m (Normal 32-64 mL/sq-m)  CI: 3.7L/min/sq-m (Normal 1.7-4.2 L/min/sq-m)  Pericardium: Normal  IMPRESSION: 1. Limited study, as only a few sequences were able to be completed due to limitations from presence of deep brain stimulator  2. Basal septal hypertrophy measuring up to 20mm (lateral wall 12 mm), consistent with hypertrophic cardiomyopathy  3. Patchy late gadolinium enhancement in basal septum, consistent with HCM  4. Basal inferolateral midwall LGE, which would not be a typical pattern for HCM, as more commonly seen in setting of prior myocarditis or sarcoidosis. Fabry's disease is associated with asymmetric hypertrophy and basal inferolateral LGE  5.  Normal LV size with hyperdynamic systolic function (EF 69%)  6.  Normal RV size and systolic function (EF 61%)   Electronically Signed By: Epifanio Lesches MD On: 10/07/2019 20:34   PYP SCAN  MYOCARDIAL AMYLOID PLANAR AND SPECT 06/07/2021  Narrative  The study is normal.  Radiotracer uptake within the heart less to uptake within the adjacent ribs (Grade 1).  Quantitative assessment of the cardiac uptake compared to the contralateral chest  wall is equal to 1.1.  Both of these asssesments may be over-estimated as ROI's include slightly more rib uptake on the left side.  IMPRESSION: Though technically equivocal, this study is not suggestive of significant TTR amyloidosis. Similar to 2020 study.         Physical Exam:   VS:  BP 112/76   Pulse 75   Ht 6\' 4"  (1.93 m)   Wt 246 lb (111.6 kg)   SpO2 96%   BMI 29.94 kg/m    Wt Readings from Last 3 Encounters:  06/25/23 246 lb (111.6 kg)  04/27/23 256 lb 12.8 oz (116.5 kg)  01/23/23 254 lb (115.2 kg)    GEN: Well nourished, well developed in no acute distress NECK: No JVD; No carotid bruits CARDIAC: RRR, no murmurs, rubs, gallops RESPIRATORY:  Clear to auscultation without rales, wheezing or rhonchi  ABDOMEN: Soft, non-tender, non-distended EXTREMITIES:  No edema; No deformity, left foot partially wrapped   ASSESSMENT AND PLAN: .    Dyspnea on exertion: Today he reports further worsened dyspnea on exertion, requiring extended periods of time to recover. He does note history of shortness of breath but feels it worsened in January.  He denies any lower extremity edema, chest pain, weight gain, PND or orthopnea.  He has not needed to take Lasix for weight gain. Lung are clear to auscultation. Lexiscan Myoview 01/2021 showed no evidence of ischemia.  Echocardiogram 07/2022 showed EF 55%, moderate LVH, grade 1 diastolic dysfunction, normal RV function, no significant valvular disease, dilated ascending aorta measuring 43 mm. BNP at that time was normal.   With history of pericardial effusion will repeat echocardiogram.  Will check CBC, BMET, BNP.  With ongoing shortness of breath and DOE will also repeat a Lexiscan Myoview for ischemic workup, he is agreeable to this, see consent below. Low suspicion for PE given adherence to Eliquis and no recent long distance travel. Will also refer to pulmonology, patients wife requests [facility].  Reviewed ED precautions.  Hypertrophic  cardiomyopathy: Cardiac MRI showed basal septal hypertrophy measuring up to 20 mm (lateral wall 12 mm).  Scar pattern was unusual for HCM, alpha gal lack ptosis the days was checked to rule out Fabry's disease, this was normal.  Unable to do T1 mapping to rule out amyloid on CMR due to DBS.  However, no evidence of amyloid on PYP scan in 2020 or in 2022.  SPEP/UPEP/light chains did not show evidence of  AL amyloid.  Cardiac monitor in 2021 showed one 4 beat run of NSVT, 6 runs of SVT longest lasting 16 beats, occasional PVCs (4%).  Continue metoprolol, prn Lasix.   PVCs: Denies having any recent palpitations. Continue metoprolol.   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 No evidence of atrial fibrillation on outpatient monitors. Follows with neurology. Continue Eliquis and aspirin per neurology.   Hypertension: Blood pressure today 106/80. Notes recently 120/80 at PCP office. Continue amlodipine, Eliquis, aspirin, atorvastatin, ezetimibe, Lasix, Losartan, metoprolol.   Hyperlipidemia:Last lipid profile on 10/07/22 indicated total cholesterol of 130 and LDL 63. Continue atorvastatin, ezetimibe, and fenofibrate.   CKD stage 3: Last creatinine 2.0 on 01/23/23. Monitored by PCP. Will check BMET today.  OSA: Reports CPAP compliance.   Leg pain/left plantar diabetic foot ulcer: Being followed by wound team for diabetic foot ulcer, his wife reports significant improvement and appears almost healed.  ABIs 04/2023 showed normal right, noncompressible left with normal toe-brachial index.   Type 2 diabetes: Hemoglobin A1C was 7.1 in 04/2022. Monitored and managed by PCP.      Informed Consent   Shared Decision Making/Informed Consent The risks [chest pain, shortness of breath, cardiac arrhythmias, dizziness, blood pressure fluctuations, myocardial infarction, stroke/transient ischemic attack, nausea,  vomiting, allergic reaction, radiation exposure, metallic taste sensation and life-threatening complications (estimated to be 1 in 10,000)], benefits (risk stratification, diagnosing coronary artery disease, treatment guidance) and alternatives of a nuclear stress test were discussed in detail with Mr. Wambach and he agrees to proceed.     Dispo: Follow up with Dr. Bjorn Pippin or APP in 6-8 weeks.   Signed, Rip Harbour, NP

## 2023-06-25 ENCOUNTER — Encounter: Payer: Self-pay | Admitting: General Practice

## 2023-06-25 ENCOUNTER — Ambulatory Visit: Payer: Medicare PPO | Attending: Cardiology | Admitting: Cardiology

## 2023-06-25 VITALS — BP 112/76 | HR 75 | Ht 76.0 in | Wt 246.0 lb

## 2023-06-25 DIAGNOSIS — E785 Hyperlipidemia, unspecified: Secondary | ICD-10-CM

## 2023-06-25 DIAGNOSIS — G4733 Obstructive sleep apnea (adult) (pediatric): Secondary | ICD-10-CM

## 2023-06-25 DIAGNOSIS — I10 Essential (primary) hypertension: Secondary | ICD-10-CM

## 2023-06-25 DIAGNOSIS — R0609 Other forms of dyspnea: Secondary | ICD-10-CM

## 2023-06-25 DIAGNOSIS — I422 Other hypertrophic cardiomyopathy: Secondary | ICD-10-CM

## 2023-06-25 DIAGNOSIS — I493 Ventricular premature depolarization: Secondary | ICD-10-CM

## 2023-06-25 DIAGNOSIS — R5383 Other fatigue: Secondary | ICD-10-CM

## 2023-06-25 DIAGNOSIS — Z8673 Personal history of transient ischemic attack (TIA), and cerebral infarction without residual deficits: Secondary | ICD-10-CM | POA: Diagnosis not present

## 2023-06-25 DIAGNOSIS — N1831 Chronic kidney disease, stage 3a: Secondary | ICD-10-CM

## 2023-06-25 DIAGNOSIS — I639 Cerebral infarction, unspecified: Secondary | ICD-10-CM | POA: Diagnosis not present

## 2023-06-25 DIAGNOSIS — I3139 Other pericardial effusion (noninflammatory): Secondary | ICD-10-CM | POA: Diagnosis not present

## 2023-06-25 DIAGNOSIS — R0602 Shortness of breath: Secondary | ICD-10-CM | POA: Diagnosis not present

## 2023-06-25 LAB — BASIC METABOLIC PANEL
BUN/Creatinine Ratio: 14 (ref 10–24)
BUN: 28 mg/dL — ABNORMAL HIGH (ref 8–27)
CO2: 23 mmol/L (ref 20–29)
Calcium: 9.6 mg/dL (ref 8.6–10.2)
Chloride: 106 mmol/L (ref 96–106)
Creatinine, Ser: 2 mg/dL — ABNORMAL HIGH (ref 0.76–1.27)
Glucose: 116 mg/dL — ABNORMAL HIGH (ref 70–99)
Potassium: 4.9 mmol/L (ref 3.5–5.2)
Sodium: 141 mmol/L (ref 134–144)
eGFR: 34 mL/min/{1.73_m2} — ABNORMAL LOW (ref >59)

## 2023-06-25 LAB — CBC

## 2023-06-25 LAB — BRAIN NATRIURETIC PEPTIDE

## 2023-06-25 NOTE — Patient Instructions (Signed)
Medication Instructions:  The current medical regimen is effective;  continue present plan and medications as directed. Please refer to the Current Medication list given to you today.  *If you need a refill on your cardiac medications before your next appointment, please call your pharmacy*  Lab Work: CBC, BMET AND BNP TODAY If you have labs (blood work) drawn today and your tests are completely normal, you will receive your results only by: MyChart Message (if you have MyChart) OR A paper copy in the mail If you have any lab test that is abnormal or we need to change your treatment, we will call you to review the results.  Other Instructions REFER TO PULMONOLOGY-DR ALVA DRAWBRIDGE  Testing/Procedures: Echocardiogram - Your physician has requested that you have an echocardiogram. Echocardiography is a painless test that uses sound waves to create images of your heart. It provides your doctor with information about the size and shape of your heart and how well your heart's chambers and valves are working. This procedure takes approximately one hour. There are no restrictions for this procedure.    Your physician has requested that you have a lexiscan myoview. For further information please visit [email or web address] Please follow instruction sheet, as given.  Follow-Up: At [facility], you and your health needs are our priority.  As part of our continuing mission to provide you with exceptional heart care, we have created designated Provider Care Teams.  These Care Teams include your primary Cardiologist (physician) and Advanced Practice Providers (APPs -  Physician Assistants and Nurse Practitioners) who all work together to provide you with the care you need, when you need it.  Your next appointment:   6-8 week(s)  Provider:   Little Ishikawa, MD  or Edd Fabian, FNP or Reather Littler, NP

## 2023-06-26 ENCOUNTER — Ambulatory Visit (HOSPITAL_COMMUNITY): Payer: Medicare PPO | Attending: Cardiology

## 2023-06-26 ENCOUNTER — Telehealth: Payer: Self-pay

## 2023-06-26 DIAGNOSIS — R0609 Other forms of dyspnea: Secondary | ICD-10-CM | POA: Diagnosis not present

## 2023-06-26 DIAGNOSIS — R0602 Shortness of breath: Secondary | ICD-10-CM | POA: Diagnosis not present

## 2023-06-26 LAB — ECHOCARDIOGRAM COMPLETE
AR max vel: 3.36 cm2
AV Area VTI: 3.22 cm2
AV Area mean vel: 3.36 cm2
AV Mean grad: 6 mmHg
AV Peak grad: 10.8 mmHg
Ao pk vel: 1.65 m/s
Area-P 1/2: 2.99 cm2
S' Lateral: 3.4 cm

## 2023-06-26 MED ORDER — PERFLUTREN LIPID MICROSPHERE
1.0000 mL | INTRAVENOUS | Status: AC | PRN
Start: 2023-06-26 — End: 2023-06-26
  Administered 2023-06-26: 3 mL via INTRAVENOUS

## 2023-06-26 NOTE — Telephone Encounter (Signed)
Gave patient lab results.  He will wait for call when BNP is completed

## 2023-06-28 ENCOUNTER — Emergency Department (HOSPITAL_COMMUNITY): Payer: Medicare PPO

## 2023-06-28 ENCOUNTER — Emergency Department (HOSPITAL_COMMUNITY)
Admission: EM | Admit: 2023-06-28 | Discharge: 2023-06-28 | Disposition: A | Discharge status: Home / Self Care | Payer: Medicare PPO | Attending: Emergency Medicine | Admitting: Emergency Medicine

## 2023-06-28 ENCOUNTER — Other Ambulatory Visit: Payer: Self-pay

## 2023-06-28 DIAGNOSIS — Y9301 Activity, walking, marching and hiking: Secondary | ICD-10-CM | POA: Diagnosis not present

## 2023-06-28 DIAGNOSIS — E1122 Type 2 diabetes mellitus with diabetic chronic kidney disease: Secondary | ICD-10-CM | POA: Insufficient documentation

## 2023-06-28 DIAGNOSIS — N189 Chronic kidney disease, unspecified: Secondary | ICD-10-CM | POA: Insufficient documentation

## 2023-06-28 DIAGNOSIS — Z794 Long term (current) use of insulin: Secondary | ICD-10-CM | POA: Diagnosis not present

## 2023-06-28 DIAGNOSIS — Z7984 Long term (current) use of oral hypoglycemic drugs: Secondary | ICD-10-CM | POA: Insufficient documentation

## 2023-06-28 DIAGNOSIS — I129 Hypertensive chronic kidney disease with stage 1 through stage 4 chronic kidney disease, or unspecified chronic kidney disease: Secondary | ICD-10-CM | POA: Insufficient documentation

## 2023-06-28 DIAGNOSIS — Z79899 Other long term (current) drug therapy: Secondary | ICD-10-CM | POA: Insufficient documentation

## 2023-06-28 DIAGNOSIS — W19XXXA Unspecified fall, initial encounter: Secondary | ICD-10-CM | POA: Diagnosis not present

## 2023-06-28 DIAGNOSIS — W1839XA Other fall on same level, initial encounter: Secondary | ICD-10-CM | POA: Diagnosis not present

## 2023-06-28 DIAGNOSIS — Y92009 Unspecified place in unspecified non-institutional (private) residence as the place of occurrence of the external cause: Secondary | ICD-10-CM | POA: Diagnosis not present

## 2023-06-28 DIAGNOSIS — M25551 Pain in right hip: Secondary | ICD-10-CM | POA: Diagnosis not present

## 2023-06-28 DIAGNOSIS — Z7901 Long term (current) use of anticoagulants: Secondary | ICD-10-CM | POA: Diagnosis not present

## 2023-06-28 DIAGNOSIS — M160 Bilateral primary osteoarthritis of hip: Secondary | ICD-10-CM | POA: Diagnosis not present

## 2023-06-28 DIAGNOSIS — Z043 Encounter for examination and observation following other accident: Secondary | ICD-10-CM | POA: Diagnosis not present

## 2023-06-28 LAB — CBG MONITORING, ED: Glucose-Capillary: 77 mg/dL (ref 70–99)

## 2023-06-28 NOTE — ED Triage Notes (Signed)
Pt from home via [facility] with reports of fall. Pt reports he ws walking in his home and lost his balance. Pt right right hip pain. Denies hitting, denies LOC.

## 2023-06-28 NOTE — ED Provider Notes (Signed)
[place] EMERGENCY DEPARTMENT AT [facility] Joshua [name] Note   CSN: [id number] Arrival date & time: 06/28/23  1155     History  Chief Complaint  Patient presents with   Hip Pain    Joshua [name] is a 72 y.o. male with history of OSA, hypertension, diabetes, hyperlipidemia, chronic kidney disease, CVA, neuropathy, who presents the emergency department after a fall.  Patient uses a walker at baseline.  He states that he lost his balance, and fell directly on his right hip earlier.  Denies any other trauma.  No head trauma or loss of consciousness.  He is on Eliquis.  He was not initially able to bear weight once he got up, but later on he was able to with his walker.  He now just complains of soreness in the right hip.  He also had another fall about 3 weeks ago.   Hip Pain       Home Medications Prior to Admission medications   Medication Sig Start Date End Date Taking? Authorizing Joshua [name]  amLODipine (NORVASC) 10 MG tablet TAKE ONE TABLET AT BEDTIME. 04/02/23   Joshua [name]  apixaban (ELIQUIS) 5 MG TABS tablet Take 1 tablet (5 mg total) by mouth 2 (two) times daily. 02/04/23   Joshua [name]  aspirin 81 MG EC tablet 1 tablet 04/23/21   Joshua [name], Historical, [name]  atorvastatin (LIPITOR) 80 MG tablet Take 1 tablet (80 mg total) by mouth daily at 6 PM. Patient taking differently: Take 40 mg by mouth daily at 6 PM. 08/31/19   Joshua [name]  colchicine 0.6 MG tablet colchicine 0.6 mg tablet  TAKE ONE TABLET BY MOUTH TWICE DAILY.    Joshua [name], Historical, [name]  Continuous Blood Gluc Sensor (FREESTYLE LIBRE 2 SENSOR) MISC See admin instructions.    Joshua [name], Historical, [name]  cycloSPORINE (RESTASIS) 0.05 % ophthalmic emulsion Apply to eye. 01/29/21   Joshua [name], Historical, [name]  doxycycline (VIBRA-TABS) 100 MG tablet Take 1 tablet (100 mg total) by mouth 2 (two) times daily. 05/08/23   Joshua [name], Joshua [name]  DULoxetine (CYMBALTA) 60 MG capsule Take  60 mg by mouth daily.    Joshua [name], Historical, [name]  escitalopram (LEXAPRO) 20 MG tablet Take 20 mg by mouth daily. 03/25/20   Joshua [name], Historical, [name]  ezetimibe (ZETIA) 10 MG tablet TAKE ONE TABLET BY MOUTH ONCE DAILY. 03/25/23   Joshua [name]  fenofibrate 54 MG tablet Take 54 mg by mouth daily.    Joshua [name], Historical, [name]  furosemide (LASIX) 40 MG tablet Take 1 tablet (40 mg total) by mouth as needed (Weight increase of 3 pounds in 1 day or 5 pounds in 1 week.). 03/26/21   Joshua [name]  Insulin Glargine, 1 Unit Dial, (TOUJEO SOLOSTAR) 300 UNIT/ML SOPN Inject 80 Units into the skin 2 (two) times daily.    Joshua [name], Historical, [name]  insulin lispro (HUMALOG) 100 UNIT/ML KwikPen Inject 15-25 Units into the skin See admin instructions. Inject 15 units subcutaneously after breakfast and 25 units after lunch and supper    Joshua [name], Historical, [name]  losartan (COZAAR) 100 MG tablet Take 1 tablet by mouth daily. 03/25/20   Joshua [name], Historical, [name]  Lutein-Zeaxanthin 25-5 MG CAPS Take 1 tablet by mouth daily.    Joshua [name], Historical, [name]  metFORMIN (GLUCOPHAGE) 500 MG tablet Take 500 mg by mouth daily with breakfast. 03/25/20   Joshua [name], Historical, [name]  metFORMIN (GLUCOPHAGE) 500 MG tablet 1 tablet with a meal  Joshua [name], Historical, [name]  metoprolol succinate (TOPROL-XL) 50 MG 24 hr tablet Take 1 tablet (50 mg total) by mouth daily. With or immediately following a meal. 04/02/23   Joshua [name]  mirtazapine (REMERON) 7.5 MG tablet Take 7.5 mg by mouth at bedtime.    Joshua [name], Historical, [name]  Multiple Vitamin (MULTIVITAMIN WITH MINERALS) TABS tablet Take 1 tablet by mouth daily.    Joshua [name], Historical, [name]  Omega-3 Fatty Acids (FISH OIL) 1000 MG CAPS Take 2,000 mg by mouth daily. Reported on 02/13/2016    Joshua [name], Historical, [name]  [facility] VERIO test strip  09/13/18   Joshua [name], Historical, [name]  Polyethyl Glycol-Propyl Glycol (SYSTANE OP) Place 1-2 drops into both eyes 4 (four) times  daily as needed (dry eyes).     Joshua [name], Historical, [name]  PRESCRIPTION MEDICATION Inhale into the lungs at bedtime. CPAP    Joshua [name], Historical, [name]  RESTASIS 0.05 % ophthalmic emulsion Place 1 drop into both eyes 2 (two) times daily as needed (dry eyes). 01/29/21   Joshua [name], Historical, [name]  silver sulfADIAZINE (SILVADENE) 1 % cream Apply 1 Application topically daily. 08/21/22   Joshua [name], Joshua [name]  SURE COMFORT PEN NEEDLES 31G X 5 MM MISC  09/12/19   Joshua [name], Historical, [name]      Allergies    Melatonin    Review of Systems   Review of Systems  Musculoskeletal:  Positive for arthralgias.  All other systems reviewed and are negative.   Physical Exam Updated Vital Signs BP (!) 148/89   Pulse 66   Temp 98.5 F (36.9 C) (Oral)   Resp 19   SpO2 100%  Physical Exam Vitals and nursing note reviewed.  Constitutional:      Appearance: Normal appearance.  HENT:     Head: Normocephalic and atraumatic.  Eyes:     Conjunctiva/sclera: Conjunctivae normal.  Pulmonary:     Effort: Pulmonary effort is normal. No respiratory distress.  Musculoskeletal:     Comments: No midline spinal tenderness, step-offs or crepitus.  Strength 5/5 in all extremities.  Sensation intact in all extremities.  Some pain with ranging the right hip, but otherwise normal range of motion.  Some point tenderness over the right lateral hip.  Skin:    General: Skin is warm and dry.  Neurological:     Mental Status: He is alert.  Psychiatric:        Mood and Affect: Mood normal.        Behavior: Behavior normal.     ED Results / Procedures / Treatments   Labs (all labs ordered are listed, but only abnormal results are displayed) Labs Reviewed  CBG MONITORING, ED    EKG None  Radiology No results found.  Procedures Procedures    Medications Ordered in ED Medications - No data to display  ED Course/ Medical Decision Making/ A&P                                 Medical Decision Making  This  patient is a 72 y.o. male  who presents to the ED for concern of right hip pain after fall.   Past Medical History / Co-morbidities / Social History: OSA, hypertension, diabetes, hyperlipidemia, chronic kidney disease, CVA, neuropathy  Physical Exam: Physical exam performed. The pertinent findings include: Some point tenderness over the right lateral hip, and pain with movement, but normal ROM.  No midline spinal tenderness, step-offs or crepitus.  Lab Tests/Imaging studies: Right hip x-ray ordered, pending at time of shift change.  Disposition: Patient discussed and care transferred to [facility] at shift change. Please see his/her note for further details regarding further ED course and disposition. Plan at time of handoff is follow-up on hip x-ray, anticipate DC to home, provided with orthopedic follow-up information if patient has symptoms despite conservative treatment.  Final Clinical Impression(s) / ED Diagnoses Final diagnoses:  Right hip pain    Rx / DC Orders ED Discharge Orders     None      Portions of this report may have been transcribed using voice recognition software. Every effort was made to ensure accuracy; however, inadvertent computerized transcription errors may be present.    Jeanella Flattery 06/28/23 1426    Lorre Nick, [name] 07/01/23 1530

## 2023-06-28 NOTE — ED Provider Notes (Signed)
Patient given in sign out by [employer], PA-C.  Please review their note for patient HPI, physical exam, workup.  At this time the plan is follow-up on x-ray and discharge if normal.  Pelvic x-ray came back negative however radiologist recommended CT if patient was unable to bear weight.  I went spoke to the patient patient states he needs his walker to walk however he does not feel he needs a CT at this time and so we had shared decision-making and agreed to discharged with outpatient therapy and follow-up and to return to ER symptoms are change or worsen.  Patient verbalizes understanding and acceptance of this plan.  Patient stable for discharge.    Joshua [name] 06/28/23 1506    Lorre Nick, MD 07/01/23 [phone]

## 2023-06-28 NOTE — Discharge Instructions (Addendum)
You were seen in the emergency department today after a fall.  As we discussed your x-ray looked reassuring, no evidence of broken or dislocated bones.  We had shared decision-making and agreed to try outpatient therapy however if symptoms change or worsen please return to ER and we may proceed with a CT scan.  I recommend following up with your primary doctor, and possibly an orthopedist if your symptoms continue.  You can take over-the-counter medication such as ibuprofen and/or Tylenol as needed.  Continue to monitor how you're doing and return to the ER for new or worsening symptoms.

## 2023-06-29 ENCOUNTER — Telehealth: Payer: Self-pay

## 2023-06-29 ENCOUNTER — Encounter (HOSPITAL_BASED_OUTPATIENT_CLINIC_OR_DEPARTMENT_OTHER): Payer: Self-pay | Admitting: Cardiology

## 2023-06-29 NOTE — Telephone Encounter (Signed)
Patient aware of lab results and verbalized understanding.

## 2023-06-29 NOTE — Telephone Encounter (Signed)
-----   Message from [facility] sent at 06/29/2023  1:34 PM EDT ----- Echocardiogram shows normal heart pumping function, there is thickening of the heart muscle, similar to prior MRI results. No significant valvular abnormalities and stable dilation of the aorta. Continue current medications and follow up as planned.

## 2023-06-29 NOTE — Telephone Encounter (Signed)
LM to call for lab results.

## 2023-06-30 DIAGNOSIS — H3412 Central retinal artery occlusion, left eye: Secondary | ICD-10-CM | POA: Diagnosis not present

## 2023-06-30 DIAGNOSIS — H5319 Other subjective visual disturbances: Secondary | ICD-10-CM | POA: Diagnosis not present

## 2023-07-01 ENCOUNTER — Encounter (HOSPITAL_COMMUNITY): Payer: Self-pay

## 2023-07-03 ENCOUNTER — Ambulatory Visit (HOSPITAL_COMMUNITY): Payer: Medicare PPO | Attending: Internal Medicine

## 2023-07-03 DIAGNOSIS — R0609 Other forms of dyspnea: Secondary | ICD-10-CM | POA: Diagnosis not present

## 2023-07-03 DIAGNOSIS — R0602 Shortness of breath: Secondary | ICD-10-CM | POA: Diagnosis not present

## 2023-07-03 LAB — MYOCARDIAL PERFUSION IMAGING
LV dias vol: 107 mL (ref 62–150)
LV sys vol: 59 mL
Nuc Stress EF: 45 %
Peak HR: 94 {beats}/min
Rest HR: 77 {beats}/min
Rest Nuclear Isotope Dose: 10.1 mCi
SDS: 4
SRS: 1
SSS: 5
Stress Nuclear Isotope Dose: 29.8 mCi
TID: 1.06

## 2023-07-03 MED ORDER — REGADENOSON 0.4 MG/5ML IV SOLN
0.4000 mg | Freq: Once | INTRAVENOUS | Status: AC
  Administered 2023-07-03: 0.4 mg via INTRAVENOUS

## 2023-07-03 MED ORDER — TECHNETIUM TC 99M TETROFOSMIN IV KIT
10.1000 | PACK | Freq: Once | INTRAVENOUS | Status: AC | PRN
  Administered 2023-07-03: 10.1 via INTRAVENOUS

## 2023-07-03 MED ORDER — TECHNETIUM TC 99M TETROFOSMIN IV KIT
29.8000 | PACK | Freq: Once | INTRAVENOUS | Status: AC | PRN
  Administered 2023-07-03: 29.8 via INTRAVENOUS

## 2023-07-04 DIAGNOSIS — E1121 Type 2 diabetes mellitus with diabetic nephropathy: Secondary | ICD-10-CM | POA: Diagnosis not present

## 2023-07-06 ENCOUNTER — Telehealth: Payer: Self-pay | Admitting: *Deleted

## 2023-07-06 NOTE — Telephone Encounter (Signed)
Transition Care Management Unsuccessful Follow-up Telephone Call  Date of discharge and from where:  [place] ed 06/28/2023  Attempts:  1st Attempt  Reason for unsuccessful TCM follow-up call:  No answer/busy

## 2023-07-06 NOTE — Telephone Encounter (Signed)
Transition Care Management Unsuccessful Follow-up Telephone Call  Date of discharge and from where:  [facility]  06/28/2023  Attempts:  2nd Attempt  Reason for unsuccessful TCM follow-up call:  No answer/busy

## 2023-07-16 ENCOUNTER — Ambulatory Visit: Payer: Medicare PPO

## 2023-07-16 ENCOUNTER — Ambulatory Visit (INDEPENDENT_AMBULATORY_CARE_PROVIDER_SITE_OTHER): Payer: Medicare PPO

## 2023-07-16 ENCOUNTER — Ambulatory Visit: Payer: Medicare PPO | Admitting: Podiatry

## 2023-07-16 DIAGNOSIS — L84 Corns and callosities: Secondary | ICD-10-CM | POA: Diagnosis not present

## 2023-07-16 DIAGNOSIS — S9031XA Contusion of right foot, initial encounter: Secondary | ICD-10-CM

## 2023-07-16 DIAGNOSIS — E11621 Type 2 diabetes mellitus with foot ulcer: Secondary | ICD-10-CM

## 2023-07-16 DIAGNOSIS — L97529 Non-pressure chronic ulcer of other part of left foot with unspecified severity: Secondary | ICD-10-CM

## 2023-07-16 DIAGNOSIS — M7989 Other specified soft tissue disorders: Secondary | ICD-10-CM

## 2023-07-16 MED ORDER — MUPIROCIN 2 % EX OINT: 1.0000 | TOPICAL_OINTMENT | Freq: Two times a day (BID) | CUTANEOUS | 2 refills | Status: DC

## 2023-07-20 NOTE — Progress Notes (Signed)
Subjective: Chief Complaint  Patient presents with   Nail Problem    Diabetic Foot Care-nail trim   Foot Orthotics    Patient is interested in getting diabetic shoes    Foot Burn    Superficial skin tear to top of left feet.     72 year old male presents the office for above concerns.  States that the wound is healed and he was going to wound care center he presents today she needs diabetic shoes to help offload.  He has noticed some bruising to the toes on the right foot but he does not recall any injury.  He has some dried blood at the tips of the toes as well but again does not recall any injuries.  No drainage or purulence that he reports.  Does not endorse any fevers or chills.    Objective: AAO x3, NAD DP/PT pulses palpable bilaterally, CRT less than 3 seconds Ulceration was present left foot submetatarsal 1 is healed.  At the tips of multiple toes there is some dried blood present but there is no significant skin breakdown today.  There is ecchymosis present to the lesser digits of the right foot as well as swelling to the forefoot.  Not able to appreciate any other open lesions.  Flexor, extensor tendons appear to be intact.  He has no pain although he does have significant neuropathy. Hammertoes present No pain with calf compression, swelling, warmth, erythema  Assessment: Bruising right foot, diabetes with neuropathy  Plan: -All treatment options discussed with the patient including all alternatives, risks, complications.  -X-rays were obtained and review of the right foot.  3 views of the foot were obtained.  Previous fifth metatarsal head excision.  Arthritic changes present.  Digital contracture present.  There is no definitive cortical destruction suggest osteomyelitis there is no evidence of acute fracture. -I do think a benefit from diabetic shoes.  Metabolic Bethann [facility]kshire for this. -Recommend a small antibiotic ointment on the area of the toes.  Monitor for any skin  breakdown. -Recommend surgical shoe/boot that he has at home for the right foot for now. -Monitor for any clinical signs or symptoms of infection and directed to call the office immediately should any occur or go to the ER.  No follow-ups on file.  Vivi Barrack DPM

## 2023-07-22 DIAGNOSIS — D485 Neoplasm of uncertain behavior of skin: Secondary | ICD-10-CM | POA: Diagnosis not present

## 2023-07-22 DIAGNOSIS — D0421 Carcinoma in situ of skin of right ear and external auricular canal: Secondary | ICD-10-CM | POA: Diagnosis not present

## 2023-07-22 DIAGNOSIS — L570 Actinic keratosis: Secondary | ICD-10-CM | POA: Diagnosis not present

## 2023-07-22 DIAGNOSIS — C44622 Squamous cell carcinoma of skin of right upper limb, including shoulder: Secondary | ICD-10-CM | POA: Diagnosis not present

## 2023-07-22 DIAGNOSIS — C44529 Squamous cell carcinoma of skin of other part of trunk: Secondary | ICD-10-CM | POA: Diagnosis not present

## 2023-07-30 ENCOUNTER — Observation Stay (HOSPITAL_COMMUNITY): Payer: Medicare PPO

## 2023-07-30 ENCOUNTER — Other Ambulatory Visit: Payer: Self-pay

## 2023-07-30 ENCOUNTER — Encounter (HOSPITAL_COMMUNITY): Payer: Self-pay | Admitting: Family Medicine

## 2023-07-30 ENCOUNTER — Emergency Department (HOSPITAL_COMMUNITY): Payer: Medicare PPO

## 2023-07-30 ENCOUNTER — Observation Stay (HOSPITAL_COMMUNITY)
Admission: EM | Admit: 2023-07-30 | Discharge: 2023-07-31 | Disposition: A | Discharge status: Home / Self Care | Payer: Medicare PPO | Attending: Internal Medicine | Admitting: Internal Medicine

## 2023-07-30 DIAGNOSIS — Z7901 Long term (current) use of anticoagulants: Secondary | ICD-10-CM | POA: Insufficient documentation

## 2023-07-30 DIAGNOSIS — R2689 Other abnormalities of gait and mobility: Secondary | ICD-10-CM | POA: Diagnosis not present

## 2023-07-30 DIAGNOSIS — Z85828 Personal history of other malignant neoplasm of skin: Secondary | ICD-10-CM | POA: Insufficient documentation

## 2023-07-30 DIAGNOSIS — R2681 Unsteadiness on feet: Secondary | ICD-10-CM | POA: Insufficient documentation

## 2023-07-30 DIAGNOSIS — E11621 Type 2 diabetes mellitus with foot ulcer: Secondary | ICD-10-CM | POA: Insufficient documentation

## 2023-07-30 DIAGNOSIS — G4733 Obstructive sleep apnea (adult) (pediatric): Secondary | ICD-10-CM | POA: Diagnosis not present

## 2023-07-30 DIAGNOSIS — Z8673 Personal history of transient ischemic attack (TIA), and cerebral infarction without residual deficits: Secondary | ICD-10-CM | POA: Insufficient documentation

## 2023-07-30 DIAGNOSIS — R29818 Other symptoms and signs involving the nervous system: Secondary | ICD-10-CM | POA: Diagnosis not present

## 2023-07-30 DIAGNOSIS — I639 Cerebral infarction, unspecified: Secondary | ICD-10-CM | POA: Diagnosis not present

## 2023-07-30 DIAGNOSIS — R0902 Hypoxemia: Secondary | ICD-10-CM | POA: Diagnosis not present

## 2023-07-30 DIAGNOSIS — N289 Disorder of kidney and ureter, unspecified: Secondary | ICD-10-CM | POA: Diagnosis not present

## 2023-07-30 DIAGNOSIS — L97529 Non-pressure chronic ulcer of other part of left foot with unspecified severity: Secondary | ICD-10-CM | POA: Diagnosis not present

## 2023-07-30 DIAGNOSIS — I5032 Chronic diastolic (congestive) heart failure: Secondary | ICD-10-CM | POA: Diagnosis not present

## 2023-07-30 DIAGNOSIS — E1122 Type 2 diabetes mellitus with diabetic chronic kidney disease: Secondary | ICD-10-CM | POA: Insufficient documentation

## 2023-07-30 DIAGNOSIS — I130 Hypertensive heart and chronic kidney disease with heart failure and stage 1 through stage 4 chronic kidney disease, or unspecified chronic kidney disease: Secondary | ICD-10-CM | POA: Insufficient documentation

## 2023-07-30 DIAGNOSIS — R471 Dysarthria and anarthria: Principal | ICD-10-CM | POA: Diagnosis present

## 2023-07-30 DIAGNOSIS — Z87891 Personal history of nicotine dependence: Secondary | ICD-10-CM | POA: Diagnosis not present

## 2023-07-30 DIAGNOSIS — Z86711 Personal history of pulmonary embolism: Secondary | ICD-10-CM | POA: Diagnosis not present

## 2023-07-30 DIAGNOSIS — Z1152 Encounter for screening for COVID-19: Secondary | ICD-10-CM | POA: Insufficient documentation

## 2023-07-30 DIAGNOSIS — O1210 Gestational proteinuria, unspecified trimester: Secondary | ICD-10-CM

## 2023-07-30 DIAGNOSIS — N1832 Chronic kidney disease, stage 3b: Secondary | ICD-10-CM | POA: Diagnosis not present

## 2023-07-30 DIAGNOSIS — Z86718 Personal history of other venous thrombosis and embolism: Secondary | ICD-10-CM | POA: Insufficient documentation

## 2023-07-30 DIAGNOSIS — G319 Degenerative disease of nervous system, unspecified: Secondary | ICD-10-CM | POA: Diagnosis not present

## 2023-07-30 DIAGNOSIS — I10 Essential (primary) hypertension: Secondary | ICD-10-CM | POA: Diagnosis present

## 2023-07-30 DIAGNOSIS — R29810 Facial weakness: Secondary | ICD-10-CM | POA: Diagnosis not present

## 2023-07-30 DIAGNOSIS — R569 Unspecified convulsions: Secondary | ICD-10-CM | POA: Diagnosis not present

## 2023-07-30 DIAGNOSIS — R0602 Shortness of breath: Secondary | ICD-10-CM | POA: Diagnosis not present

## 2023-07-30 DIAGNOSIS — R4781 Slurred speech: Secondary | ICD-10-CM | POA: Diagnosis not present

## 2023-07-30 DIAGNOSIS — Z91199 Patient's noncompliance with other medical treatment and regimen due to unspecified reason: Principal | ICD-10-CM

## 2023-07-30 DIAGNOSIS — I771 Stricture of artery: Secondary | ICD-10-CM | POA: Diagnosis not present

## 2023-07-30 DIAGNOSIS — J9811 Atelectasis: Secondary | ICD-10-CM | POA: Diagnosis not present

## 2023-07-30 DIAGNOSIS — R0989 Other specified symptoms and signs involving the circulatory and respiratory systems: Secondary | ICD-10-CM | POA: Diagnosis not present

## 2023-07-30 LAB — BASIC METABOLIC PANEL
Anion gap: 6 (ref 5–15)
BUN: 20 mg/dL (ref 8–23)
CO2: 24 mmol/L (ref 22–32)
Calcium: 8.5 mg/dL — ABNORMAL LOW (ref 8.9–10.3)
Chloride: 108 mmol/L (ref 98–111)
Creatinine, Ser: 2.17 mg/dL — ABNORMAL HIGH (ref 0.61–1.24)
GFR, Estimated: 31 mL/min — ABNORMAL LOW (ref >60)
Glucose, Bld: 146 mg/dL — ABNORMAL HIGH (ref 70–99)
Potassium: 4.9 mmol/L (ref 3.5–5.1)
Sodium: 138 mmol/L (ref 135–145)

## 2023-07-30 LAB — COMPREHENSIVE METABOLIC PANEL
ALT: 22 U/L (ref 0–44)
AST: 29 U/L (ref 15–41)
Albumin: 3.1 g/dL — ABNORMAL LOW (ref 3.5–5.0)
Alkaline Phosphatase: 67 U/L (ref 38–126)
Anion gap: 12 (ref 5–15)
BUN: 21 mg/dL (ref 8–23)
CO2: 24 mmol/L (ref 22–32)
Calcium: 8.5 mg/dL — ABNORMAL LOW (ref 8.9–10.3)
Chloride: 104 mmol/L (ref 98–111)
Creatinine, Ser: 2.15 mg/dL — ABNORMAL HIGH (ref 0.61–1.24)
GFR, Estimated: 32 mL/min — ABNORMAL LOW (ref >60)
Glucose, Bld: 152 mg/dL — ABNORMAL HIGH (ref 70–99)
Potassium: 4.7 mmol/L (ref 3.5–5.1)
Sodium: 140 mmol/L (ref 135–145)
Total Bilirubin: 0.4 mg/dL (ref 0.3–1.2)
Total Protein: 5.7 g/dL — ABNORMAL LOW (ref 6.5–8.1)

## 2023-07-30 LAB — CBC
HCT: 34.9 % — ABNORMAL LOW (ref 39.0–52.0)
HCT: 34.5 % — ABNORMAL LOW (ref 39.0–52.0)
Hemoglobin: 11.3 g/dL — ABNORMAL LOW (ref 13.0–17.0)
Hemoglobin: 11.2 g/dL — ABNORMAL LOW (ref 13.0–17.0)
MCH: 29.8 pg (ref 26.0–34.0)
MCH: 30 pg (ref 26.0–34.0)
MCHC: 32.4 g/dL (ref 30.0–36.0)
MCHC: 32.5 g/dL (ref 30.0–36.0)
MCV: 92.1 fL (ref 80.0–100.0)
MCV: 92.5 fL (ref 80.0–100.0)
Platelets: 216 10*3/uL (ref 150–400)
Platelets: 186 10*3/uL (ref 150–400)
RBC: 3.79 MIL/uL — ABNORMAL LOW (ref 4.22–5.81)
RBC: 3.73 MIL/uL — ABNORMAL LOW (ref 4.22–5.81)
RDW: 14.6 % (ref 11.5–15.5)
RDW: 14.5 % (ref 11.5–15.5)
WBC: 9.2 10*3/uL (ref 4.0–10.5)
WBC: 7 10*3/uL (ref 4.0–10.5)
nRBC: 0 % (ref 0.0–0.2)
nRBC: 0 % (ref 0.0–0.2)

## 2023-07-30 LAB — DIFFERENTIAL
Abs Immature Granulocytes: 0.03 10*3/uL (ref 0.00–0.07)
Basophils Absolute: 0.1 10*3/uL (ref 0.0–0.1)
Basophils Relative: 1 %
Eosinophils Absolute: 0.4 10*3/uL (ref 0.0–0.5)
Eosinophils Relative: 5 %
Immature Granulocytes: 0 %
Lymphocytes Relative: 47 %
Lymphs Abs: 4.4 10*3/uL — ABNORMAL HIGH (ref 0.7–4.0)
Monocytes Absolute: 0.6 10*3/uL (ref 0.1–1.0)
Monocytes Relative: 7 %
Neutro Abs: 3.7 10*3/uL (ref 1.7–7.7)
Neutrophils Relative %: 40 %

## 2023-07-30 LAB — GLUCOSE, CAPILLARY
Glucose-Capillary: 157 mg/dL — ABNORMAL HIGH (ref 70–99)
Glucose-Capillary: 119 mg/dL — ABNORMAL HIGH (ref 70–99)
Glucose-Capillary: 101 mg/dL — ABNORMAL HIGH (ref 70–99)
Glucose-Capillary: 136 mg/dL — ABNORMAL HIGH (ref 70–99)

## 2023-07-30 LAB — PROTIME-INR
INR: 1.1 (ref 0.8–1.2)
Prothrombin Time: 14.5 s (ref 11.4–15.2)

## 2023-07-30 LAB — RESP PANEL BY RT-PCR (RSV, FLU A&B, COVID)  RVPGX2
Influenza A by PCR: NEGATIVE
Influenza B by PCR: NEGATIVE
Resp Syncytial Virus by PCR: NEGATIVE
SARS Coronavirus 2 by RT PCR: NEGATIVE

## 2023-07-30 LAB — I-STAT CHEM 8, ED
BUN: 25 mg/dL — ABNORMAL HIGH (ref 8–23)
Calcium, Ion: 1.13 mmol/L — ABNORMAL LOW (ref 1.15–1.40)
Chloride: 106 mmol/L (ref 98–111)
Creatinine, Ser: 2.3 mg/dL — ABNORMAL HIGH (ref 0.61–1.24)
Glucose, Bld: 147 mg/dL — ABNORMAL HIGH (ref 70–99)
HCT: 33 % — ABNORMAL LOW (ref 39.0–52.0)
Hemoglobin: 11.2 g/dL — ABNORMAL LOW (ref 13.0–17.0)
Potassium: 4.7 mmol/L (ref 3.5–5.1)
Sodium: 142 mmol/L (ref 135–145)
TCO2: 26 mmol/L (ref 22–32)

## 2023-07-30 LAB — CBG MONITORING, ED: Glucose-Capillary: 142 mg/dL — ABNORMAL HIGH (ref 70–99)

## 2023-07-30 LAB — HEMOGLOBIN A1C
Hgb A1c MFr Bld: 6.6 % — ABNORMAL HIGH (ref 4.8–5.6)
Mean Plasma Glucose: 142.72 mg/dL

## 2023-07-30 LAB — ETHANOL: Alcohol, Ethyl (B): <10 mg/dL (ref <10)

## 2023-07-30 LAB — APTT: aPTT: 33 s (ref 24–36)

## 2023-07-30 MED ORDER — SODIUM CHLORIDE 0.9% FLUSH
3.0000 mL | Freq: Two times a day (BID) | INTRAVENOUS | Status: DC
  Administered 2023-07-30 – 2023-07-31 (×2): 3 mL via INTRAVENOUS

## 2023-07-30 MED ORDER — ONDANSETRON HCL 4 MG/2ML IJ SOLN: 4.0000 mg | Freq: Four times a day (QID) | INTRAMUSCULAR | Status: DC | PRN

## 2023-07-30 MED ORDER — HEPARIN SODIUM (PORCINE) 5000 UNIT/ML IJ SOLN
5000.0000 [IU] | Freq: Three times a day (TID) | INTRAMUSCULAR | Status: DC
  Administered 2023-07-30 – 2023-07-31 (×4): 5000 [IU] via SUBCUTANEOUS
  Filled 2023-07-30 (×4): qty 1

## 2023-07-30 MED ORDER — INSULIN GLARGINE-YFGN 100 UNIT/ML ~~LOC~~ SOLN
40.0000 [IU] | Freq: Two times a day (BID) | SUBCUTANEOUS | Status: DC
  Administered 2023-07-30 (×2): 40 [IU] via SUBCUTANEOUS
  Filled 2023-07-30 (×6): qty 0.4

## 2023-07-30 MED ORDER — IOHEXOL 350 MG/ML SOLN
75.0000 mL | Freq: Once | INTRAVENOUS | Status: AC | PRN
  Administered 2023-07-30: 75 mL via INTRAVENOUS

## 2023-07-30 MED ORDER — ONDANSETRON HCL 4 MG PO TABS: 4.0000 mg | ORAL_TABLET | Freq: Four times a day (QID) | ORAL | Status: DC | PRN

## 2023-07-30 MED ORDER — ACETAMINOPHEN 325 MG PO TABS: 650.0000 mg | ORAL_TABLET | Freq: Four times a day (QID) | ORAL | Status: DC | PRN

## 2023-07-30 MED ORDER — METOPROLOL SUCCINATE ER 50 MG PO TB24
50.0000 mg | ORAL_TABLET | Freq: Every day | ORAL | Status: DC
  Administered 2023-07-30 – 2023-07-31 (×2): 50 mg via ORAL
  Filled 2023-07-30 (×2): qty 1

## 2023-07-30 MED ORDER — LOSARTAN POTASSIUM 50 MG PO TABS
100.0000 mg | ORAL_TABLET | Freq: Every day | ORAL | Status: DC
  Administered 2023-07-30 – 2023-07-31 (×2): 100 mg via ORAL
  Filled 2023-07-30 (×2): qty 2

## 2023-07-30 MED ORDER — ACETAMINOPHEN 650 MG RE SUPP: 650.0000 mg | Freq: Four times a day (QID) | RECTAL | Status: DC | PRN

## 2023-07-30 MED ORDER — INSULIN ASPART 100 UNIT/ML IJ SOLN: 0.0000 [IU] | Freq: Every day | INTRAMUSCULAR | Status: DC

## 2023-07-30 MED ORDER — SODIUM CHLORIDE 0.9% FLUSH
3.0000 mL | Freq: Once | INTRAVENOUS | Status: AC
  Administered 2023-07-30: 3 mL via INTRAVENOUS

## 2023-07-30 MED ORDER — SENNOSIDES-DOCUSATE SODIUM 8.6-50 MG PO TABS: 1.0000 | ORAL_TABLET | Freq: Every evening | ORAL | Status: DC | PRN

## 2023-07-30 MED ORDER — ATORVASTATIN CALCIUM 40 MG PO TABS
40.0000 mg | ORAL_TABLET | Freq: Every day | ORAL | Status: DC
  Administered 2023-07-30 – 2023-07-31 (×2): 40 mg via ORAL
  Filled 2023-07-30 (×2): qty 1

## 2023-07-30 MED ORDER — INSULIN ASPART 100 UNIT/ML IJ SOLN
0.0000 [IU] | Freq: Three times a day (TID) | INTRAMUSCULAR | Status: DC
  Administered 2023-07-30: 1 [IU] via SUBCUTANEOUS

## 2023-07-30 MED ORDER — AMLODIPINE BESYLATE 10 MG PO TABS
10.0000 mg | ORAL_TABLET | Freq: Every day | ORAL | Status: DC
  Administered 2023-07-30: 10 mg via ORAL
  Filled 2023-07-30: qty 1

## 2023-07-30 MED ORDER — ASPIRIN 81 MG PO TBEC
81.0000 mg | DELAYED_RELEASE_TABLET | Freq: Every day | ORAL | Status: DC
  Administered 2023-07-30 – 2023-07-31 (×2): 81 mg via ORAL
  Filled 2023-07-30 (×2): qty 1

## 2023-07-30 MED ORDER — DULOXETINE HCL 60 MG PO CPEP
60.0000 mg | ORAL_CAPSULE | Freq: Two times a day (BID) | ORAL | Status: DC
  Administered 2023-07-30 – 2023-07-31 (×3): 60 mg via ORAL
  Filled 2023-07-30 (×3): qty 1

## 2023-07-30 MED ORDER — CYCLOSPORINE 0.05 % OP EMUL
1.0000 [drp] | Freq: Two times a day (BID) | OPHTHALMIC | Status: DC
  Administered 2023-07-30 – 2023-07-31 (×3): 1 [drp] via OPHTHALMIC
  Filled 2023-07-30 (×5): qty 30

## 2023-07-30 NOTE — Consult Note (Signed)
Neurology Consultation Reason for Consult: dysarthria Referring Physician: [place], a  CC: Acute dysarthria  History is obtained from: Patient, wife  HPI: Joshua [name] is a 72 y.o. male who was in his normal state of health around 11:30 PM, and then sometime around midnight when his wife came to bed, it was noted that he was having difficulty speaking.  He was both having dysarthria as well as difficulty with getting his words out and due to this code stroke was activated.  On arrival to the emergency department, he was taken for a stat CT/CTA was negative.  He is not a candidate for any type of IV thrombolytics due to his anticoagulation, and there is no LVO to target for acute intervention.  He thinks his speech is more of his dry mouth neurological complaint.  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   Melanoma (HCC)    NSVT (nonsustained ventricular tachycardia) (HCC)    1 4 beat run on event monitor in 07/2020   Obesity    OSA on CPAP    setting = 5   Pulmonary emboli (HCC) 2018   PVC's (premature ventricular contractions)    SVT (supraventricular tachycardia)    by event monitor   Tremor, essential 06/22/2017   Wears glasses      Family History  Problem Relation Age of Onset   Cerebral aneurysm Mother    Tremor Mother    Alzheimer's disease Father    Tremor Brother    Tremor  Maternal Uncle    Diabetes Maternal Grandmother    Healthy Son    Colon cancer Neg Hx      Social History:  reports that he quit smoking about 39 years ago. His smoking use included cigarettes. He started smoking about 49 years ago. He has a 10 pack-year smoking history. He has never used smokeless tobacco. He reports current alcohol use of about 2.0 standard drinks of alcohol per week. He reports that he does not use drugs.   Exam: Current vital signs: BP (!) 128/90   Pulse 93   Temp (!) 97.3 F (36.3 C) (Oral)   Resp 20   Ht 6\' 4"  (1.93 m)   Wt 109.3 kg   SpO2 100%   BMI 29.34 kg/m  Vital signs in last 24 hours: Temp:  [97.3 F (36.3 C)] 97.3 F (36.3 C) (09/05 0128) Pulse Rate:  [93-102] 93 (09/05 0200) Resp:  [20] 20 (09/05 0130) BP: (122-137)/(82-91) 128/90 (09/05 0200) SpO2:  [93 %-100 %] 100 % (09/05 0200) Weight:  [109.3 kg] 109.3 kg (09/05 0138)   Physical Exam  Appears well-developed and well-nourished.   Neuro: Mental Status: Patient is awake, alert, oriented to person, place, month, year, and situation.  Patient is able to give a clear and coherent history. No signs of aphasia or neglect Cranial Nerves: He has significant dysarthria II: Visual Fields are full.  Right pupil is smaller than the left, but both are reactive III,IV, VI: EOMI without ptosis or diploplia.  V: Facial sensation is symmetric to temperature VII: Facial movement is symmetric.  VIII: hearing is intact to voice X: Uvula elevates symmetrically XI: Shoulder shrug is symmetric. XII: tongue is midline without atrophy or fasciculations.  Motor: He does not drift in any extremity, but does bob slightly due to tremor. Sensory: Sensation is symmetric to light touch and temperature in the arms and legs. Cerebellar: He has significant tremor in all four extremities, on the left it appears like it could be consistent with essential tremor, over the right it appears grossly ataxic.     I  have reviewed labs in epic and the results pertinent to this consultation are: Creatinine 2.15  I have reviewed the images obtained: CT/CTA-negative  Impression: 72 year old male with a history of previous stroke, severe essential tremor status post unilateral DBS (had complicated surgery preventing bilateral placement).  He was tachycardic, and does have some dry mucous membranes, but his dysarthria seems out of proportion of this to me.  It is possible that he may have recrudescence of his previous stroke symptoms in the setting of dehydration, but given the abruptness of the change my suspicion is rather that this represents new acute ischemic stroke.  Though he does have a DBS, he has had an MRI performed since placement of this, so it is presumably MRI compatible.  Recommendations: 1) MRI brain 2) assessment for other causes of recrudescence such as infection, hypertension 3) hold anticoagulation pending MRI of the brain 4) stroke workup if MRI is positive 5) stroke team to follow  Ritta Slot, MD [facility] [phone]  If 7pm- 7am, please page neurology on call as listed in AMION.

## 2023-07-30 NOTE — TOC Initial Note (Signed)
Transition of Care [facility]) - Initial/Assessment Note    Patient Details  Name: Joshua [name] MRN: [id number] Date of Birth: 1951/07/26  Transition of Care [facility]) CM/SW Contact:    Kermit Balo, RN Phone Number: 07/30/2023, 11:08 AM  Clinical Narrative:                 Pt is from home with his spouse. He states they are together all the time.  Spouse provides needed transportation.  Pt manages his own medications and he denies any issues. Awaiting MRI results.  TOC following.  Expected Discharge Plan: Home/Self Care Barriers to Discharge: Continued Medical Work up   Patient Goals and CMS Choice            Expected Discharge Plan and Services       Living arrangements for the past 2 months: Single Family Home                                      Prior Living Arrangements/Services Living arrangements for the past 2 months: Single Family Home Lives with:: Spouse Patient language and need for interpreter reviewed:: Yes Do you feel safe going back to the place where you live?: Yes        Care giver support system in place?: Yes (comment) Current home services: DME (walker/ CPAP) Criminal Activity/Legal Involvement Pertinent to Current Situation/Hospitalization: No - Comment as needed  Activities of Daily Living Home Assistive Devices/Equipment: Walker (specify type), CPAP, Blood pressure cuff, CBG Meter ADL Screening (condition at time of admission) Patient's cognitive ability adequate to safely complete daily activities?: Yes Is the patient deaf or have difficulty hearing?: Yes Does the patient have difficulty seeing, even when wearing glasses/contacts?: Yes Does the patient have difficulty concentrating, remembering, or making decisions?: Yes Patient able to express need for assistance with ADLs?: Yes Does the patient have difficulty dressing or bathing?: Yes Independently performs ADLs?: Yes (appropriate for developmental age) Does the patient have  difficulty walking or climbing stairs?: Yes Weakness of Legs: Both Weakness of Arms/Hands: None  Permission Sought/Granted                  Emotional Assessment Appearance:: Appears stated age Attitude/Demeanor/Rapport: Engaged Affect (typically observed): Accepting Orientation: : Oriented to Self, Oriented to Place, Oriented to  Time, Oriented to Situation   Psych Involvement: No (comment)  Admission diagnosis:  Renal insufficiency [N28.9] Hypoxia [R09.02] Dysarthria [R47.1] Cerebrovascular accident (CVA), unspecified mechanism (HCC) [I63.9] Patient Active Problem List   Diagnosis Date Noted   Dysarthria 07/30/2023   Other reduced mobility 05/08/2023   Ataxia 08/05/2021   Confusion 08/05/2021   Dystrophia unguium 08/05/2021   Encounter for fitting and adjustment of hearing aid 08/05/2021   Foot ulcer (HCC) 08/05/2021   Acute respiratory failure with hypoxia (HCC) 08/05/2021   Edema 08/05/2021   Neuropathy 08/05/2021   Pericardial effusion 08/05/2021   Staphylococcal infectious disease 08/05/2021   Unilateral primary osteoarthritis, right knee 08/05/2021   Cerebral thrombosis with cerebral infarction 04/24/2021   Bacteremia, coagulase-negative staphylococcal 04/21/2021   History of pulmonary embolism 04/21/2021   History of stroke 04/21/2021   DOE (dyspnea on exertion) 01/30/2021   Acute hypoxemic respiratory failure (HCC) 01/29/2021   Bilateral hearing loss 05/15/2020   Bilateral impacted cerumen 05/15/2020   Fungal otitis externa 05/15/2020   Impacted cerumen 05/14/2020   Hearing loss 03/27/2020   Melanocytic nevi, unspecified 03/27/2020  Aphasia 09/01/2019   Cardiomegaly 09/01/2019   Dysphagia 09/01/2019   Cerebral embolism with cerebral infarction 08/29/2019   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HCC) 08/28/2019   Hyperlipidemia 08/28/2019   Chronic kidney disease 08/28/2019   Obesity (BMI 30-39.9) 08/28/2019   Presence of other specified devices 07/28/2019    Tremor 07/07/2019   Cough 01/13/2019   Lymphadenopathy 12/31/2018   Abnormal findings on diagnostic imaging of other specified body structures 06/18/2018   Noncompliance with treatment 03/25/2018   Epistaxis 01/14/2018   Essential tremor 06/22/2017   Fatigue 06/19/2017   Hardening of the aorta (main artery of the heart) (HCC) 04/15/2017   Long term (current) use of anticoagulants 04/14/2017   OSA on CPAP 04/10/2017   Essential hypertension 04/10/2017   Pulmonary thromboembolism (HCC) 04/10/2017   PE (pulmonary thromboembolism) (HCC) 04/09/2017   Orthostatic hypotension 04/09/2017   S/P left TKA 03/30/2017   S/P total knee replacement 03/30/2017   Electrocardiogram abnormal 07/29/2016   Constipation 05/26/2016   Varicose veins of lower extremity 05/26/2016   Cellulitis of left foot 03/07/2016   Pre-ulcerative calluses 03/07/2016   Type 2 diabetes mellitus with left diabetic foot ulcer (HCC) 01/14/2016   Diabetic peripheral neuropathy associated with type 2 diabetes mellitus (HCC) 10/19/2015   Plantar wart 08/08/2015   Encounter for general adult medical examination without abnormal findings 05/17/2015   CKD stage 3b, GFR 30-44 ml/min (HCC) 10/21/2013   Late effects of cerebrovascular disease 10/21/2013   Irritability and anger 03/04/2013   Major depression, single episode, in complete remission (HCC) 03/04/2013   Panic disorder 03/04/2013   Disequilibrium 04/17/2011   Allergic rhinitis 01/23/2011   Diabetic renal disease (HCC) 11/29/2009   Gout 09/20/2009   Osteoarthritis 09/20/2009   Proteinuria 09/20/2009   Ventricular premature depolarization 09/20/2009   Diabetes mellitus type 2 in obese (HCC) 08/07/2008   GERD 08/07/2008   PCP:  Creola Corn, MD Pharmacy:   [facility] [place], [place] - [address] [place] [address] Phone: [phone] Fax: [phone]  [facility] [address] [place] [address] Phone: [phone] Fax: [phone]  [facility] - [place], [place] - [address] [place] [address] Phone: [phone] Fax: [phone]     Social Determinants of Health (SDOH) Social History: SDOH Screenings   Food Insecurity: No Food Insecurity (07/30/2023)  Housing: Low Risk  (07/30/2023)  Transportation Needs: Unmet Transportation Needs (07/30/2023)  Utilities: Not At Risk (07/30/2023)  Depression (PHQ2-9): Low Risk  (04/25/2020)  Tobacco Use: Medium Risk (07/30/2023)   SDOH Interventions:     Readmission Risk Interventions     No data to display

## 2023-07-30 NOTE — Plan of Care (Signed)
  Problem: Education: Goal: Ability to describe self-care measures that may prevent or decrease complications (Diabetes Survival Skills Education) will improve Outcome: Progressing Goal: Individualized Educational Video(s) Outcome: Progressing   Problem: Coping: Goal: Ability to adjust to condition or change in health will improve Outcome: Progressing   Problem: Fluid Volume: Goal: Ability to maintain a balanced intake and output will improve Outcome: Progressing   Problem: Health Behavior/Discharge Planning: Goal: Ability to identify and utilize available resources and services will improve Outcome: Progressing Goal: Ability to manage health-related needs will improve Outcome: Progressing   Problem: Metabolic: Goal: Ability to maintain appropriate glucose levels will improve Outcome: Progressing   Problem: Nutritional: Goal: Maintenance of adequate nutrition will improve Outcome: Progressing Goal: Progress toward achieving an optimal weight will improve Outcome: Progressing   Problem: Skin Integrity: Goal: Risk for impaired skin integrity will decrease Outcome: Progressing   Problem: Tissue Perfusion: Goal: Adequacy of tissue perfusion will improve Outcome: Progressing   Problem: Education: Goal: Knowledge of General Education information will improve Description: Including pain rating scale, medication(s)/side effects and non-pharmacologic comfort measures Outcome: Progressing   Problem: Health Behavior/Discharge Planning: Goal: Ability to manage health-related needs will improve Outcome: Progressing   Problem: Clinical Measurements: Goal: Ability to maintain clinical measurements within normal limits will improve Outcome: Progressing Goal: Will remain free from infection Outcome: Progressing Goal: Diagnostic test results will improve Outcome: Progressing Goal: Respiratory complications will improve Outcome: Progressing Goal: Cardiovascular complication will  be avoided Outcome: Progressing   Problem: Activity: Goal: Risk for activity intolerance will decrease Outcome: Progressing   Problem: Nutrition: Goal: Adequate nutrition will be maintained Outcome: Progressing   Problem: Coping: Goal: Level of anxiety will decrease Outcome: Progressing   Problem: Elimination: Goal: Will not experience complications related to bowel motility Outcome: Progressing Goal: Will not experience complications related to urinary retention Outcome: Progressing   Problem: Pain Managment: Goal: General experience of comfort will improve Outcome: Progressing   Problem: Safety: Goal: Ability to remain free from injury will improve Outcome: Progressing   Problem: Skin Integrity: Goal: Risk for impaired skin integrity will decrease Outcome: Progressing   Problem: Education: Goal: Knowledge of secondary prevention will improve (MUST DOCUMENT ALL) Outcome: Progressing   Problem: Education: Goal: Knowledge of disease or condition will improve Outcome: Progressing Goal: Knowledge of secondary prevention will improve (MUST DOCUMENT ALL) Outcome: Progressing Goal: Knowledge of patient specific risk factors will improve Loraine Leriche N/A or DELETE if not current risk factor) Outcome: Progressing   Problem: Ischemic Stroke/TIA Tissue Perfusion: Goal: Complications of ischemic stroke/TIA will be minimized Outcome: Progressing

## 2023-07-30 NOTE — Plan of Care (Signed)
  Problem: Fluid Volume: Goal: Ability to maintain a balanced intake and output will improve Outcome: Progressing   Problem: Health Behavior/Discharge Planning: Goal: Ability to identify and utilize available resources and services will improve Outcome: Progressing   

## 2023-07-30 NOTE — Evaluation (Signed)
Physical Therapy Evaluation Patient Details Name: Joshua [name] MRN: [id number] DOB: 1951/08/10 Today's Date: 07/30/2023  History of Present Illness  Pt is 72 year old presented [facility] on  07/30/23 for slurred speech and difficulty getting words out. CT negative for acute findings. MRI pending.  PMH - significant for hypertrophic cardiomyopathy, HTN, DMII, CHF, OSA on CPAP, CKD stage IIIa, PE after knee surgery, essential tremor status post deep brain stimulator 2020, Hx of CVA.  Clinical Impression  Pt presents to PT close to his baseline. Has chronic rt foot drop which he reports he has never tried and AFO for. Suggested to pt and pt's wife that they ask podiatrist for referral to orthotist/prosthetist for evaluation for a rt AFO.         If plan is discharge home, recommend the following:     Can travel by private vehicle        Equipment Recommendations None recommended by PT  Recommendations for Other Services       Functional Status Assessment Patient has had a recent decline in their functional status and demonstrates the ability to make significant improvements in function in a reasonable and predictable amount of time.     Precautions / Restrictions Precautions Precautions: Fall Precaution Comments: R drop foot Restrictions Weight Bearing Restrictions: No      Mobility  Bed Mobility Overal bed mobility: Modified Independent                  Transfers Overall transfer level: Needs assistance Equipment used: Rollator (4 wheels) Transfers: Sit to/from Stand Sit to Stand: Supervision           General transfer comment: Assist for safety    Ambulation/Gait Ambulation/Gait assistance: Supervision Gait Distance (Feet): 225 Feet Assistive device: Rollator (4 wheels) Gait Pattern/deviations: Step-through pattern, Decreased dorsiflexion - right, Knee flexed in stance - right Gait velocity: decr Gait velocity interpretation: 1.31 - 2.62 ft/sec, indicative  of limited community ambulator   General Gait Details: Assist for safety. Pt catching rt foot due to decr clearance on rt due to chronic rt foot drop  Stairs            Wheelchair Mobility     Tilt Bed    Modified Rankin (Stroke Patients Only)       Balance Overall balance assessment: History of Falls, Needs assistance Sitting-balance support: Feet supported Sitting balance-Leahy Scale: Good     Standing balance support: No upper extremity supported Standing balance-Leahy Scale: Fair                               Pertinent Vitals/Pain Pain Assessment Pain Assessment: No/denies pain    Home Living Family/patient expects to be discharged to:: Private residence Living Arrangements: Spouse/significant other Available Help at Discharge: Family;Available 24 hours/day Type of Home: House Home Access: Stairs to enter Entrance Stairs-Rails: Right Entrance Stairs-Number of Steps: 4-5   Home Layout: One level Home Equipment: Shower seat - built in;Grab bars - tub/shower;Hand held shower head;Cane - single point;Rollator (4 wheels);Rolling Walker (2 wheels)      Prior Function Prior Level of Function : Independent/Modified Independent;Driving             Mobility Comments: Modified Independent with rollator ADLs Comments: Min A with lower body ADL and buttons due to tremors     Extremity/Trunk Assessment   Upper Extremity Assessment Upper Extremity Assessment: Defer to OT evaluation RUE  Deficits / Details: Baseline essential tremors RUE Coordination: decreased fine motor LUE Deficits / Details: Baseline essential tremors LUE Coordination: decreased fine motor    Lower Extremity Assessment Lower Extremity Assessment: Generalized weakness;RLE deficits/detail RLE Deficits / Details: chronic rt foot drop that increases as pt fatigues    Cervical / Trunk Assessment Cervical / Trunk Assessment: Normal  Communication    Communication Communication: Difficulty communicating thoughts/reduced clarity of speech  Cognition Arousal: Alert Behavior During Therapy: WFL for tasks assessed/performed Overall Cognitive Status: Within Functional Limits for tasks assessed                                          General Comments      Exercises     Assessment/Plan    PT Assessment Patient needs continued PT services  PT Problem List Decreased strength;Decreased balance;Decreased mobility       PT Treatment Interventions DME instruction;Gait training;Stair training;Therapeutic exercise;Therapeutic activities;Functional mobility training;Balance training;Patient/family education    PT Goals (Current goals can be found in the Care Plan section)  Acute Rehab PT Goals Patient Stated Goal: return home PT Goal Formulation: With patient Time For Goal Achievement: 08/06/23 Potential to Achieve Goals: Good    Frequency Min 1X/week     Co-evaluation               AM-PAC PT "6 Clicks" Mobility  Outcome Measure Help needed turning from your back to your side while in a flat bed without using bedrails?: None Help needed moving from lying on your back to sitting on the side of a flat bed without using bedrails?: None Help needed moving to and from a bed to a chair (including a wheelchair)?: A Little Help needed standing up from a chair using your arms (e.g., wheelchair or bedside chair)?: A Little Help needed to walk in hospital room?: A Little Help needed climbing 3-5 steps with a railing? : A Little 6 Click Score: 20    End of Session Equipment Utilized During Treatment: Gait belt Activity Tolerance: Patient tolerated treatment well Patient left: in chair;with call bell/phone within reach;with chair alarm set;with family/visitor present   PT Visit Diagnosis: Unsteadiness on feet (R26.81);Other abnormalities of gait and mobility (R26.89)    Time: 4401-0272 PT Time Calculation (min)  (ACUTE ONLY): 14 min   Charges:   PT Evaluation $PT Eval Low Complexity: 1 Low   PT General Charges $$ ACUTE PT VISIT: 1 Visit         [facility] PT Acute Rehabilitation Services Office [phone]   Angelina Ok Shannon [facility] 07/30/2023, 1:41 PM

## 2023-07-30 NOTE — Progress Notes (Signed)
EEG complete - results pending 

## 2023-07-30 NOTE — ED Provider Notes (Signed)
[place] EMERGENCY DEPARTMENT AT [facility] Provider Note   CSN: [id number] Arrival date & time: 07/30/23  3244  An emergency department physician performed an initial assessment on this suspected stroke patient at 0107.  History  Chief Complaint  Patient presents with   Code Stroke    Joshua [name] is a 72 y.o. male.  The history is provided by the EMS personnel and the patient. The history is limited by the condition of the patient.  Cerebrovascular Accident This is a recurrent problem. The problem occurs constantly. Associated symptoms include shortness of breath. Pertinent negatives include no chest pain, no abdominal pain and no headaches. Nothing aggravates the symptoms. Nothing relieves the symptoms. He has tried nothing for the symptoms. The treatment provided mild relief.  Patient with a h/o CVA and CKD  presents with L facial droop and difficulty making speech within the past 3 hours.  Also new O2 requirement.      Past Medical History:  Diagnosis Date   Benign essential tremor    Benign positional vertigo    Chronic kidney disease 08/28/2019   CVA (cerebral vascular accident) (HCC)    x2 - L retina, 1 right parietal   Degenerative arthritis    Depression    Diabetes mellitus    DVT (deep venous thrombosis) (HCC) 2018   Dyslipidemia    GERD (gastroesophageal reflux disease)    hiatal hernia   Gout    H/O: vasectomy    Hearing aid worn    b/l   Hx of appendectomy    Hx of tonsillectomy    Hypertension    Hypertrophic cardiomyopathy (HCC)    Ischemic optic neuropathy    on the left   Melanoma (HCC)    NSVT (nonsustained ventricular tachycardia) (HCC)    1 4 beat run on event monitor in 07/2020   Obesity    OSA on CPAP    setting = 5   Pulmonary emboli (HCC) 2018   PVC's (premature ventricular contractions)    SVT (supraventricular tachycardia)    by event monitor   Tremor, essential 06/22/2017   Wears glasses      Home  Medications Prior to Admission medications   Medication Sig Start Date End Date Taking? Authorizing Provider  amLODipine (NORVASC) 10 MG tablet TAKE ONE TABLET AT BEDTIME. 04/02/23  Yes Little Ishikawa, MD  apixaban (ELIQUIS) 5 MG TABS tablet Take 1 tablet (5 mg total) by mouth 2 (two) times daily. 02/04/23  Yes Little Ishikawa, MD  aspirin 81 MG EC tablet 81 mg daily. 04/23/21  Yes [provider]  atorvastatin (LIPITOR) 80 MG tablet Take 1 tablet (80 mg total) by mouth daily at 6 PM. Patient taking differently: Take 40 mg by mouth daily at 6 PM. 08/31/19  Yes Danford, Earl Lites, MD  cycloSPORINE (RESTASIS) 0.05 % ophthalmic emulsion Apply 1 drop to eye 2 (two) times daily. 01/29/21  Yes [provider]  DULoxetine (CYMBALTA) 60 MG capsule Take 60 mg by mouth 2 (two) times daily.   Yes [provider]  ezetimibe (ZETIA) 10 MG tablet TAKE ONE TABLET BY MOUTH ONCE DAILY. 03/25/23  Yes Little Ishikawa, MD  fenofibrate 54 MG tablet Take 54 mg by mouth daily.   Yes [provider]  furosemide (LASIX) 40 MG tablet Take 1 tablet (40 mg total) by mouth as needed (Weight increase of 3 pounds in 1 day or 5 pounds in 1 week.). 03/26/21  Yes Bjorn Pippin,  Tanna Savoy, MD  Insulin Glargine, 1 Unit Dial, (TOUJEO SOLOSTAR) 300 UNIT/ML SOPN Inject 70 Units into the skin 2 (two) times daily.   Yes [provider]  losartan (COZAAR) 100 MG tablet Take 1 tablet by mouth daily. 03/25/20  Yes [provider]  metFORMIN (GLUCOPHAGE) 500 MG tablet Take 500 mg by mouth 2 (two) times daily with a meal. 03/25/20  Yes [provider]  Multiple Vitamin (MULTIVITAMIN WITH MINERALS) TABS tablet Take 1 tablet by mouth daily.   Yes [provider]  Polyethyl Glycol-Propyl Glycol (SYSTANE OP) Place 1-2 drops into both eyes 4 (four) times daily as needed (dry eyes).    Yes [provider]  PRESCRIPTION MEDICATION Inhale into the lungs at  bedtime. CPAP   Yes [provider]  colchicine 0.6 MG tablet colchicine 0.6 mg tablet  TAKE ONE TABLET BY MOUTH TWICE DAILY. Patient not taking: Reported on 07/30/2023    [provider]  Continuous Blood Gluc Sensor (FREESTYLE LIBRE 2 SENSOR) MISC See admin instructions.    [provider]  metoprolol succinate (TOPROL-XL) 50 MG 24 hr tablet Take 1 tablet (50 mg total) by mouth daily. With or immediately following a meal. 04/02/23   Little Ishikawa, MD  mirtazapine (REMERON) 7.5 MG tablet Take 7.5 mg by mouth at bedtime.    [provider]  [facility] VERIO test strip  09/13/18   [provider]  SURE COMFORT PEN NEEDLES 31G X 5 MM MISC  09/12/19   [provider]      Allergies    Melatonin    Review of Systems   Review of Systems  Unable to perform ROS: Acuity of condition  Constitutional:  Negative for fever.  Respiratory:  Positive for shortness of breath.   Cardiovascular:  Negative for chest pain.  Gastrointestinal:  Negative for abdominal pain.  Neurological:  Positive for facial asymmetry and speech difficulty. Negative for headaches.    Physical Exam Updated Vital Signs BP (!) 128/90   Pulse 93   Temp (!) 97.3 F (36.3 C) (Oral)   Resp 20   Ht 6\' 4"  (1.93 m)   Wt 109.3 kg   SpO2 100%   BMI 29.34 kg/m  Physical Exam Vitals and nursing note reviewed. Exam conducted with a chaperone present.  Constitutional:      General: He is not in acute distress.    Appearance: He is well-developed. He is not diaphoretic.  HENT:     Head: Normocephalic and atraumatic.     Nose: Nose normal.     Mouth/Throat:     Mouth: Mucous membranes are moist.     Pharynx: Oropharynx is clear.  Eyes:     Extraocular Movements: Extraocular movements intact.     Conjunctiva/sclera: Conjunctivae normal.     Pupils: Pupils are equal, round, and reactive to light.  Cardiovascular:     Rate and Rhythm: Normal rate and regular rhythm.      Pulses: Normal pulses.     Heart sounds: Normal heart sounds.  Pulmonary:     Effort: Pulmonary effort is normal.     Breath sounds: Normal breath sounds. No wheezing or rales.  Abdominal:     General: Bowel sounds are normal.     Palpations: Abdomen is soft.     Tenderness: There is no abdominal tenderness. There is no guarding or rebound.  Musculoskeletal:        General: Normal range of motion.  Cervical back: Normal range of motion and neck supple.  Skin:    General: Skin is warm and dry.  Neurological:     Mental Status: He is alert and oriented to person, place, and time.     Cranial Nerves: Cranial nerve deficit present.     Deep Tendon Reflexes: Reflexes normal.  Psychiatric:        Mood and Affect: Mood normal.     ED Results / Procedures / Treatments   Labs (all labs ordered are listed, but only abnormal results are displayed) Results for orders placed or performed during the hospital encounter of 07/30/23  Resp panel by RT-PCR (RSV, Flu A&B, Covid) Anterior Nasal Swab   Specimen: Anterior Nasal Swab  Result Value Ref Range   SARS Coronavirus 2 by RT PCR NEGATIVE NEGATIVE   Influenza A by PCR NEGATIVE NEGATIVE   Influenza B by PCR NEGATIVE NEGATIVE   Resp Syncytial Virus by PCR NEGATIVE NEGATIVE  Protime-INR  Result Value Ref Range   Prothrombin Time 14.5 11.4 - 15.2 seconds   INR 1.1 0.8 - 1.2  APTT  Result Value Ref Range   aPTT 33 24 - 36 seconds  CBC  Result Value Ref Range   WBC 9.2 4.0 - 10.5 K/uL   RBC 3.79 (L) 4.22 - 5.81 MIL/uL   Hemoglobin 11.3 (L) 13.0 - 17.0 g/dL   HCT 40.9 (L) 81.1 - 91.4 %   MCV 92.1 80.0 - 100.0 fL   MCH 29.8 26.0 - 34.0 pg   MCHC 32.4 30.0 - 36.0 g/dL   RDW 78.2 95.6 - 21.3 %   Platelets 216 150 - 400 K/uL   nRBC 0.0 0.0 - 0.2 %  Differential  Result Value Ref Range   Neutrophils Relative % 40 %   Neutro Abs 3.7 1.7 - 7.7 K/uL   Lymphocytes Relative 47 %   Lymphs Abs 4.4 (H) 0.7 - 4.0 K/uL   Monocytes  Relative 7 %   Monocytes Absolute 0.6 0.1 - 1.0 K/uL   Eosinophils Relative 5 %   Eosinophils Absolute 0.4 0.0 - 0.5 K/uL   Basophils Relative 1 %   Basophils Absolute 0.1 0.0 - 0.1 K/uL   Immature Granulocytes 0 %   Abs Immature Granulocytes 0.03 0.00 - 0.07 K/uL  Comprehensive metabolic panel  Result Value Ref Range   Sodium 140 135 - 145 mmol/L   Potassium 4.7 3.5 - 5.1 mmol/L   Chloride 104 98 - 111 mmol/L   CO2 24 22 - 32 mmol/L   Glucose, Bld 152 (H) 70 - 99 mg/dL   BUN 21 8 - 23 mg/dL   Creatinine, Ser 0.86 (H) 0.61 - 1.24 mg/dL   Calcium 8.5 (L) 8.9 - 10.3 mg/dL   Total Protein 5.7 (L) 6.5 - 8.1 g/dL   Albumin 3.1 (L) 3.5 - 5.0 g/dL   AST 29 15 - 41 U/L   ALT 22 0 - 44 U/L   Alkaline Phosphatase 67 38 - 126 U/L   Total Bilirubin 0.4 0.3 - 1.2 mg/dL   GFR, Estimated 32 (L) >60 mL/min   Anion gap 12 5 - 15  Ethanol  Result Value Ref Range   Alcohol, Ethyl (B) <10 <10 mg/dL  I-stat chem 8, ED  Result Value Ref Range   Sodium 142 135 - 145 mmol/L   Potassium 4.7 3.5 - 5.1 mmol/L   Chloride 106 98 - 111 mmol/L   BUN 25 (H) 8 - 23 mg/dL  Creatinine, Ser 2.30 (H) 0.61 - 1.24 mg/dL   Glucose, Bld 962 (H) 70 - 99 mg/dL   Calcium, Ion 9.52 (L) 1.15 - 1.40 mmol/L   TCO2 26 22 - 32 mmol/L   Hemoglobin 11.2 (L) 13.0 - 17.0 g/dL   HCT 84.1 (L) 32.4 - 40.1 %  CBG monitoring, ED  Result Value Ref Range   Glucose-Capillary 142 (H) 70 - 99 mg/dL   DG Chest Portable 1 View  Result Date: 07/30/2023 CLINICAL DATA:  Hypoxia EXAM: PORTABLE CHEST 1 VIEW COMPARISON:  07/30/2022 FINDINGS: Heart and mediastinal contours within normal limits. Low lung volumes, bibasilar atelectasis. No effusions or pneumothorax. No acute bony abnormality. Right upper chest wall stimulator battery pack again noted, unchanged. IMPRESSION: Low lung volumes, bibasilar atelectasis. Electronically Signed   By: Charlett Nose M.D.   On: 07/30/2023 03:43   CT HEAD CODE STROKE WO CONTRAST  Result Date:  07/30/2023 CLINICAL DATA:  Initial evaluation for neuro deficit, stroke, facial droop. EXAM: CT ANGIOGRAPHY HEAD AND NECK TECHNIQUE: Multidetector CT imaging of the head and neck was performed using the standard protocol during bolus administration of intravenous contrast. Multiplanar CT image reconstructions and MIPs were obtained to evaluate the vascular anatomy. Carotid stenosis measurements (when applicable) are obtained utilizing NASCET criteria, using the distal internal carotid diameter as the denominator. RADIATION DOSE REDUCTION: This exam was performed according to the departmental dose-optimization program which includes automated exposure control, adjustment of the mA and/or kV according to patient size and/or use of iterative reconstruction technique. CONTRAST:  75mL OMNIPAQUE IOHEXOL 350 MG/ML SOLN COMPARISON:  Prior study from 04/22/2021. FINDINGS: CT HEAD FINDINGS Brain: Atrophy with moderate chronic microvascular ischemic disease. Remote lacunar infarct present at the right basal ganglia. Left-sided DBS electrode in place. No acute intracranial hemorrhage. No acute large vessel territory infarct. No mass lesion or midline shift. No hydrocephalus or extra-axial fluid collection. Vascular: No abnormal hyperdense vessel. Skull: No acute scalp soft tissue abnormality.  Calvarium intact. Sinuses/Orbits: Globes and orbital soft tissues demonstrate no acute finding. Retained metallic density present anterior to the left lobe (series 4, image 19). Paranasal sinuses are clear. No mastoid effusion. Other: None. ASPECTS (Alberta Stroke Program Early CT Score) - Ganglionic level infarction (caudate, lentiform nuclei, internal capsule, insula, M1-M3 cortex): 7 - Supraganglionic infarction (M4-M6 cortex): 3 Total score (0-10 with 10 being normal): 10 - CTA NECK FINDINGS Aortic arch: Visualized aortic arch within normal limits for caliber. Bovine branching pattern noted. Scattered plaque about the arch and origin  of the great vessels without hemodynamically significant stenosis. Specific note is made of some soft plaque/thrombus protruding into the lumen of the proximal left subclavian artery (series 7, image 341). Right carotid system: Right common and internal carotid arteries are tortuous without dissection. Mild atheromatous change about the right carotid bulb without hemodynamically significant stenosis. Left carotid system: Left common and internal carotid arteries are tortuous but patent without dissection. Moderate atheromatous change about the left carotid bulb without hemodynamically significant stenosis. Vertebral arteries: Both vertebral arteries arise from subclavian arteries. Vertebral arteries are mildly tortuous but patent without stenosis or dissection. Skeleton: No discrete or worrisome osseous lesions. Other neck: No acute finding. Upper chest: No acute finding.  Right-sided pacemaker/AICD in place. Review of the MIP images confirms the above findings CTA HEAD FINDINGS Anterior circulation: Moderate atheromatous change about the carotid siphons without hemodynamically significant stenosis. A1 segments patent bilaterally. Right A1 hypoplastic. Normal anterior communicating artery complex. Anterior cerebral arteries patent without stenosis. Normal  in stenosis or occlusion. No proximal MCA branch occlusion or high-grade stenosis. Distal MCA branches perfused and symmetric. Posterior circulation: Both V4 segments patent without stenosis. Right PICA patent. Left PICA not seen. Basilar widely patent. Superior cerebellar and posterior cerebral arteries patent bilaterally. Venous sinuses: Patent allowing for timing the contrast bolus. Anatomic variants: As above. Intracranial circulation is somewhat diffusely dolichoectatic in appearance. No aneurysm. Review of the MIP images confirms the above findings IMPRESSION: CT HEAD: 1. No acute intracranial abnormality. 2. Aspects is 10. 3. Atrophy with moderate chronic  small vessel ischemic disease, with remote right basal ganglia lacunar infarct. CTA HEAD AND NECK: 1. Negative CTA for large vessel occlusion or other emergent finding. 2. Moderate atheromatous change about the carotid bifurcations and carotid siphons without hemodynamically significant stenosis. 3. Diffuse tortuosity of the major arterial vasculature of the head and neck, suggesting chronic underlying hypertension. These results were communicated to Dr. Amada Jupiter at 1:36 am on 07/30/2023 by text page via the [facility] messaging system. Electronically Signed   By: Rise Mu M.D.   On: 07/30/2023 01:51   CT ANGIO HEAD NECK W WO CM (CODE STROKE)  Result Date: 07/30/2023 CLINICAL DATA:  Initial evaluation for neuro deficit, stroke, facial droop. EXAM: CT ANGIOGRAPHY HEAD AND NECK TECHNIQUE: Multidetector CT imaging of the head and neck was performed using the standard protocol during bolus administration of intravenous contrast. Multiplanar CT image reconstructions and MIPs were obtained to evaluate the vascular anatomy. Carotid stenosis measurements (when applicable) are obtained utilizing NASCET criteria, using the distal internal carotid diameter as the denominator. RADIATION DOSE REDUCTION: This exam was performed according to the departmental dose-optimization program which includes automated exposure control, adjustment of the mA and/or kV according to patient size and/or use of iterative reconstruction technique. CONTRAST:  75mL OMNIPAQUE IOHEXOL 350 MG/ML SOLN COMPARISON:  Prior study from 04/22/2021. FINDINGS: CT HEAD FINDINGS Brain: Atrophy with moderate chronic microvascular ischemic disease. Remote lacunar infarct present at the right basal ganglia. Left-sided DBS electrode in place. No acute intracranial hemorrhage. No acute large vessel territory infarct. No mass lesion or midline shift. No hydrocephalus or extra-axial fluid collection. Vascular: No abnormal hyperdense vessel. Skull: No acute  scalp soft tissue abnormality.  Calvarium intact. Sinuses/Orbits: Globes and orbital soft tissues demonstrate no acute finding. Retained metallic density present anterior to the left lobe (series 4, image 19). Paranasal sinuses are clear. No mastoid effusion. Other: None. ASPECTS (Alberta Stroke Program Early CT Score) - Ganglionic level infarction (caudate, lentiform nuclei, internal capsule, insula, M1-M3 cortex): 7 - Supraganglionic infarction (M4-M6 cortex): 3 Total score (0-10 with 10 being normal): 10 - CTA NECK FINDINGS Aortic arch: Visualized aortic arch within normal limits for caliber. Bovine branching pattern noted. Scattered plaque about the arch and origin of the great vessels without hemodynamically significant stenosis. Specific note is made of some soft plaque/thrombus protruding into the lumen of the proximal left subclavian artery (series 7, image 341). Right carotid system: Right common and internal carotid arteries are tortuous without dissection. Mild atheromatous change about the right carotid bulb without hemodynamically significant stenosis. Left carotid system: Left common and internal carotid arteries are tortuous but patent without dissection. Moderate atheromatous change about the left carotid bulb without hemodynamically significant stenosis. Vertebral arteries: Both vertebral arteries arise from subclavian arteries. Vertebral arteries are mildly tortuous but patent without stenosis or dissection. Skeleton: No discrete or worrisome osseous lesions. Other neck: No acute finding. Upper chest: No acute finding.  Right-sided pacemaker/AICD in place. Review of  the MIP images confirms the above findings CTA HEAD FINDINGS Anterior circulation: Moderate atheromatous change about the carotid siphons without hemodynamically significant stenosis. A1 segments patent bilaterally. Right A1 hypoplastic. Normal anterior communicating artery complex. Anterior cerebral arteries patent without stenosis.  Normal in stenosis or occlusion. No proximal MCA branch occlusion or high-grade stenosis. Distal MCA branches perfused and symmetric. Posterior circulation: Both V4 segments patent without stenosis. Right PICA patent. Left PICA not seen. Basilar widely patent. Superior cerebellar and posterior cerebral arteries patent bilaterally. Venous sinuses: Patent allowing for timing the contrast bolus. Anatomic variants: As above. Intracranial circulation is somewhat diffusely dolichoectatic in appearance. No aneurysm. Review of the MIP images confirms the above findings IMPRESSION: CT HEAD: 1. No acute intracranial abnormality. 2. Aspects is 10. 3. Atrophy with moderate chronic small vessel ischemic disease, with remote right basal ganglia lacunar infarct. CTA HEAD AND NECK: 1. Negative CTA for large vessel occlusion or other emergent finding. 2. Moderate atheromatous change about the carotid bifurcations and carotid siphons without hemodynamically significant stenosis. 3. Diffuse tortuosity of the major arterial vasculature of the head and neck, suggesting chronic underlying hypertension. These results were communicated to Dr. Amada Jupiter at 1:36 am on 07/30/2023 by text page via the [facility] messaging system. Electronically Signed   By: Rise Mu M.D.   On: 07/30/2023 01:51   DG Foot Complete Right  Result Date: 07/20/2023 Please see detailed radiograph report in office note.  DG Foot Complete Left  Result Date: 07/20/2023 Please see detailed radiograph report in office note.  MYOCARDIAL PERFUSION IMAGING  Result Date: 07/05/2023   Lexiscan stress is electrically nondiagnostic.   Myoview scan shows normal perfusion.  No ischemia or scar   LVEF on gating calcluated at 45% though visually appears normal.  COnsider echo to further define LVEF   Overall low risk study   Prior study not available for comparison.    EKG See MUSE gy CT HEAD CODE STROKE WO CONTRAST  Result Date: 07/30/2023 CLINICAL DATA:   Initial evaluation for neuro deficit, stroke, facial droop. EXAM: CT ANGIOGRAPHY HEAD AND NECK TECHNIQUE: Multidetector CT imaging of the head and neck was performed using the standard protocol during bolus administration of intravenous contrast. Multiplanar CT image reconstructions and MIPs were obtained to evaluate the vascular anatomy. Carotid stenosis measurements (when applicable) are obtained utilizing NASCET criteria, using the distal internal carotid diameter as the denominator. RADIATION DOSE REDUCTION: This exam was performed according to the departmental dose-optimization program which includes automated exposure control, adjustment of the mA and/or kV according to patient size and/or use of iterative reconstruction technique. CONTRAST:  75mL OMNIPAQUE IOHEXOL 350 MG/ML SOLN COMPARISON:  Prior study from 04/22/2021. FINDINGS: CT HEAD FINDINGS Brain: Atrophy with moderate chronic microvascular ischemic disease. Remote lacunar infarct present at the right basal ganglia. Left-sided DBS electrode in place. No acute intracranial hemorrhage. No acute large vessel territory infarct. No mass lesion or midline shift. No hydrocephalus or extra-axial fluid collection. Vascular: No abnormal hyperdense vessel. Skull: No acute scalp soft tissue abnormality.  Calvarium intact. Sinuses/Orbits: Globes and orbital soft tissues demonstrate no acute finding. Retained metallic density present anterior to the left lobe (series 4, image 19). Paranasal sinuses are clear. No mastoid effusion. Other: None. ASPECTS (Alberta Stroke Program Early CT Score) - Ganglionic level infarction (caudate, lentiform nuclei, internal capsule, insula, M1-M3 cortex): 7 - Supraganglionic infarction (M4-M6 cortex): 3 Total score (0-10 with 10 being normal): 10 - CTA NECK FINDINGS Aortic arch: Visualized aortic arch within normal limits  for caliber. Bovine branching pattern noted. Scattered plaque about the arch and origin of the great vessels  without hemodynamically significant stenosis. Specific note is made of some soft plaque/thrombus protruding into the lumen of the proximal left subclavian artery (series 7, image 341). Right carotid system: Right common and internal carotid arteries are tortuous without dissection. Mild atheromatous change about the right carotid bulb without hemodynamically significant stenosis. Left carotid system: Left common and internal carotid arteries are tortuous but patent without dissection. Moderate atheromatous change about the left carotid bulb without hemodynamically significant stenosis. Vertebral arteries: Both vertebral arteries arise from subclavian arteries. Vertebral arteries are mildly tortuous but patent without stenosis or dissection. Skeleton: No discrete or worrisome osseous lesions. Other neck: No acute finding. Upper chest: No acute finding.  Right-sided pacemaker/AICD in place. Review of the MIP images confirms the above findings CTA HEAD FINDINGS Anterior circulation: Moderate atheromatous change about the carotid siphons without hemodynamically significant stenosis. A1 segments patent bilaterally. Right A1 hypoplastic. Normal anterior communicating artery complex. Anterior cerebral arteries patent without stenosis. Normal in stenosis or occlusion. No proximal MCA branch occlusion or high-grade stenosis. Distal MCA branches perfused and symmetric. Posterior circulation: Both V4 segments patent without stenosis. Right PICA patent. Left PICA not seen. Basilar widely patent. Superior cerebellar and posterior cerebral arteries patent bilaterally. Venous sinuses: Patent allowing for timing the contrast bolus. Anatomic variants: As above. Intracranial circulation is somewhat diffusely dolichoectatic in appearance. No aneurysm. Review of the MIP images confirms the above findings IMPRESSION: CT HEAD: 1. No acute intracranial abnormality. 2. Aspects is 10. 3. Atrophy with moderate chronic small vessel ischemic  disease, with remote right basal ganglia lacunar infarct. CTA HEAD AND NECK: 1. Negative CTA for large vessel occlusion or other emergent finding. 2. Moderate atheromatous change about the carotid bifurcations and carotid siphons without hemodynamically significant stenosis. 3. Diffuse tortuosity of the major arterial vasculature of the head and neck, suggesting chronic underlying hypertension. These results were communicated to Dr. Amada Jupiter at 1:36 am on 07/30/2023 by text page via the [facility] messaging system. Electronically Signed   By: Rise Mu M.D.   On: 07/30/2023 01:51   CT ANGIO HEAD NECK W WO CM (CODE STROKE)  Result Date: 07/30/2023 CLINICAL DATA:  Initial evaluation for neuro deficit, stroke, facial droop. EXAM: CT ANGIOGRAPHY HEAD AND NECK TECHNIQUE: Multidetector CT imaging of the head and neck was performed using the standard protocol during bolus administration of intravenous contrast. Multiplanar CT image reconstructions and MIPs were obtained to evaluate the vascular anatomy. Carotid stenosis measurements (when applicable) are obtained utilizing NASCET criteria, using the distal internal carotid diameter as the denominator. RADIATION DOSE REDUCTION: This exam was performed according to the departmental dose-optimization program which includes automated exposure control, adjustment of the mA and/or kV according to patient size and/or use of iterative reconstruction technique. CONTRAST:  75mL OMNIPAQUE IOHEXOL 350 MG/ML SOLN COMPARISON:  Prior study from 04/22/2021. FINDINGS: CT HEAD FINDINGS Brain: Atrophy with moderate chronic microvascular ischemic disease. Remote lacunar infarct present at the right basal ganglia. Left-sided DBS electrode in place. No acute intracranial hemorrhage. No acute large vessel territory infarct. No mass lesion or midline shift. No hydrocephalus or extra-axial fluid collection. Vascular: No abnormal hyperdense vessel. Skull: No acute scalp soft tissue  abnormality.  Calvarium intact. Sinuses/Orbits: Globes and orbital soft tissues demonstrate no acute finding. Retained metallic density present anterior to the left lobe (series 4, image 19). Paranasal sinuses are clear. No mastoid effusion. Other: None. ASPECTS [facility] Stroke  Program Early CT Score) - Ganglionic level infarction (caudate, lentiform nuclei, internal capsule, insula, M1-M3 cortex): 7 - Supraganglionic infarction (M4-M6 cortex): 3 Total score (0-10 with 10 being normal): 10 - CTA NECK FINDINGS Aortic arch: Visualized aortic arch within normal limits for caliber. Bovine branching pattern noted. Scattered plaque about the arch and origin of the great vessels without hemodynamically significant stenosis. Specific note is made of some soft plaque/thrombus protruding into the lumen of the proximal left subclavian artery (series 7, image 341). Right carotid system: Right common and internal carotid arteries are tortuous without dissection. Mild atheromatous change about the right carotid bulb without hemodynamically significant stenosis. Left carotid system: Left common and internal carotid arteries are tortuous but patent without dissection. Moderate atheromatous change about the left carotid bulb without hemodynamically significant stenosis. Vertebral arteries: Both vertebral arteries arise from subclavian arteries. Vertebral arteries are mildly tortuous but patent without stenosis or dissection. Skeleton: No discrete or worrisome osseous lesions. Other neck: No acute finding. Upper chest: No acute finding.  Right-sided pacemaker/AICD in place. Review of the MIP images confirms the above findings CTA HEAD FINDINGS Anterior circulation: Moderate atheromatous change about the carotid siphons without hemodynamically significant stenosis. A1 segments patent bilaterally. Right A1 hypoplastic. Normal anterior communicating artery complex. Anterior cerebral arteries patent without stenosis. Normal in stenosis  or occlusion. No proximal MCA branch occlusion or high-grade stenosis. Distal MCA branches perfused and symmetric. Posterior circulation: Both V4 segments patent without stenosis. Right PICA patent. Left PICA not seen. Basilar widely patent. Superior cerebellar and posterior cerebral arteries patent bilaterally. Venous sinuses: Patent allowing for timing the contrast bolus. Anatomic variants: As above. Intracranial circulation is somewhat diffusely dolichoectatic in appearance. No aneurysm. Review of the MIP images confirms the above findings IMPRESSION: CT HEAD: 1. No acute intracranial abnormality. 2. Aspects is 10. 3. Atrophy with moderate chronic small vessel ischemic disease, with remote right basal ganglia lacunar infarct. CTA HEAD AND NECK: 1. Negative CTA for large vessel occlusion or other emergent finding. 2. Moderate atheromatous change about the carotid bifurcations and carotid siphons without hemodynamically significant stenosis. 3. Diffuse tortuosity of the major arterial vasculature of the head and neck, suggesting chronic underlying hypertension. These results were communicated to Dr. Amada Jupiter at 1:36 am on 07/30/2023 by text page via the [facility] messaging system. Electronically Signed   By: Rise Mu M.D.   On: 07/30/2023 01:51    Procedures .Critical Care E&M  Performed by: Cy Blamer, MD Critical care provider statement:    Critical care end time:  07/30/2023 5:04 AM   Critical care was necessary to treat or prevent imminent or life-threatening deterioration of the following conditions:  CNS failure or compromise   Critical care was time spent personally by me on the following activities:  Discussions with consultants, obtaining history from patient or surrogate, ordering and performing treatments and interventions, ordering and review of laboratory studies, ordering and review of radiographic studies, pulse oximetry, re-evaluation of patient's condition and review of old  charts   I assumed direction of critical care for this patient from another provider in my specialty: yes     Care discussed with: admitting provider   After initial E/M assessment, critical care services were subsequently performed that were exclusive of separately billable procedures or treatment.       Medications Ordered in ED Medications  sodium chloride flush (NS) 0.9 % injection 3 mL (3 mLs Intravenous Given 07/30/23 0144)  iohexol (OMNIPAQUE) 350 MG/ML injection 75 mL (75 mLs  Intravenous Contrast Given 07/30/23 0124)    ED Course/ Medical Decision Making/ A&P                                 Medical Decision Making Patient called out as code stroke   Amount and/or Complexity of Data Reviewed Independent Historian: EMS    Details: See above  External Data Reviewed: labs and notes.    Details: Previous notes and kidney function reviewed  Labs: ordered.    Details: Negative covid and flu.  Normal coagulation studies.  Normal white count 9.2, normal hemoglobin 11.3, normal platelets.  Normal sdoium 142, normal potassium 4.7, elevated creatinine 2.30  Radiology: ordered and independent interpretation performed.    Details: No ICH by me on CT, no PNA by me on CXR ECG/medicine tests: ordered and independent interpretation performed.    Details: See muse   Risk Decision regarding hospitalization. Risk Details: Patient will need MRI, also concerning is new O2 requirement.  Cannot CTA patient due to creatinine.  Will need VQ later in the day.    Critical Care Total time providing critical care: 30 minutes    Final Clinical Impression(s) / ED Diagnoses Final diagnoses:  None   The patient appears reasonably stabilized for admission considering the current resources, flow, and capabilities available in the ED at this time, and I doubt any other [facility] requiring further screening and/or treatment in the ED prior to admission.  Rx / DC Orders ED Discharge Orders     None          Daris Harkins, MD 07/30/23 [phone]

## 2023-07-30 NOTE — Code Documentation (Signed)
Stroke Response Nurse Documentation Code Documentation  Joshua [name] is a 72 y.o. male arriving to [facility]  via [facility] on 9/5 with past medical hx of CVA, HTN, PE, HLD, essential tremors with stimulator, DM, cardiomyopathy. On Eliquis (apixaban) daily. Code stroke was activated by EMS.   Patient from home where he was LKW at 2330 and now complaining of slurred speech.    Stroke team at the bedside on patient arrival. Labs drawn and patient cleared for CT by Dr. Nicanor Alcon. Patient to CT with team. NIHSS 2, see documentation for details and code stroke times. Patient with right limb ataxia and dysarthria  on exam. The following imaging was completed:  CT Head and CTA. Patient is not a candidate for IV Thrombolytic due to Eliquis. Patient is not a candidate for IR due to No LVO.     Bedside handoff with ED RN Tiffany.    Rose Fillers  Rapid Response RN

## 2023-07-30 NOTE — Progress Notes (Signed)
  Progress Note   Patient: Joshua [name] DOB: 1951-11-10 DOA: 07/30/2023     0 DOS: the patient was seen and examined on 07/30/2023   Brief hospital course: 72 y.o. male with medical history significant for hypertension, type 2 diabetes mellitus, history of PE on Eliquis, OSA on CPAP, CKD 3B, history of CVA, and essential tremor with deep brain stimulator who presents with acute onset speech difficulty.   Patient reports that he had had an uneventful evening, was in bed, and had just turned on his CPAP machine when he had a vague sensation that something was wrong.  While trying to relate this to his wife, he was noted to have slurred speech.  He has also been experiencing difficulty getting the words out.  He denies any new focal numbness or weakness.  He reports difficulty with balance that he attributes to his prior stroke.  He denies chest pain, shortness of breath, leg swelling, fever, or chills.  Assessment and Plan: 1. Speech difficulty  - No acute findings on head CT   - MRI was ordered, remains pending once DBS is changed to MRI mode -Discussed with Neurology. Recs for ASA with plavix x 3 mos then aspirin alone afterwards -PT/OT following   2. Hx of VTE in 2018 noted - Hold Elqiuis. Per neurology, recs to stop anticoagulation and transition to ASA and plavix per above   3. Insulin-dependent type II DM  - A1c was 7.9% in 2022  - Check CBGs, cont SSI as needed   4. Hypertension  - Continue Norvasc, losartan, metoprolol  -bp stable at present   5. Hx of CVA  - Continue ASA and Lipitor    6. Chronic diastolic CHF  - Appears compensated   - Monitor weight and I/Os    7. OSA  - CPAP at bedtime        Subjective: Reports feeling better today  Physical Exam: Vitals:   07/30/23 0545 07/30/23 0735 07/30/23 1335 07/30/23 1555  BP: 136/83 139/89 122/80 (!) 148/84  Pulse: 98 90 80 81  Resp: 20 19 20 18   Temp: 97.9 F (36.6 C) 97.8 F (36.6 C) 97.6 F (36.4  C) 98.1 F (36.7 C)  TempSrc: Oral Oral Oral Oral  SpO2: 100% 100% 100% 98%  Weight:      Height:       General exam: Awake, laying in bed, in nad Respiratory system: Normal respiratory effort, no wheezing Cardiovascular system: regular rate, s1, s2 Gastrointestinal system: Soft, nondistended, positive BS Central nervous system: CN2-12 grossly intact, strength intact Extremities: Perfused, no clubbing Skin: Normal skin turgor, no notable skin lesions seen Psychiatry: Mood normal // no visual hallucinations   Data Reviewed:  Labs reviewed: Na 138, K 4.9, Cr 2.17, Hgb 11.2  Family Communication: Pt in room, family at bedside  Disposition: Status is: Observation The patient remains OBS appropriate and will d/c before 2 midnights.  Planned Discharge Destination: Home    Author: Rickey Barbara, MD 07/30/2023 4:07 PM  For on call review [email or web address].

## 2023-07-30 NOTE — Procedures (Signed)
Patient Name: Joshua [name]  MRN: [id number]  Epilepsy Attending: Charlsie Quest  Referring Physician/Provider: Tomie China, MD  Date: 07/30/2023 Duration: 28.17 mins  Patient history: 72 y.o. male with difficulty speaking getting eeg to evaluate for seizure  Level of alertness: Awake, asleep  AEDs during EEG study:   Technical aspects: This EEG study was done with scalp electrodes positioned according to the 10-20 International system of electrode placement. Electrical activity was reviewed with band pass filter of 1-70Hz , sensitivity of 7 uV/mm, display speed of 30mm/sec with a 60Hz  notched filter applied as appropriate. EEG data were recorded continuously and digitally stored.  Video monitoring was available and reviewed as appropriate.  Description: The posterior dominant rhythm consists of 8-9 Hz activity of moderate voltage (25-35 uV) seen predominantly in posterior head regions, symmetric and reactive to eye opening and eye closing. Sleep was characterized by vertex waves, sleep spindles (12 to 14 Hz), maximal frontocentral region. Physiologic photic driving was not seen during photic stimulation.  Hyperventilation was not performed.     IMPRESSION: This study is within normal limits. No seizures or epileptiform discharges were seen throughout the recording.  A normal interictal EEG does not exclude the diagnosis of epilepsy.  Lavan Imes Annabelle Harman

## 2023-07-30 NOTE — Progress Notes (Signed)
Patient spouse charging remote for deep brain stimulator at home. Remote must be charged in order to have MRI. Attending MD and Neurologist notified.

## 2023-07-30 NOTE — Progress Notes (Signed)
Before MRI, call Thornton Dales from [employer] [phone] to place Stimulator in MRI mode. Can call anytime.

## 2023-07-30 NOTE — Evaluation (Signed)
Occupational Therapy Evaluation Patient Details Name: Joshua [name] MRN: [id number] DOB: 29-Dec-1950 Today's Date: 07/30/2023   History of Present Illness Pt is 72 year old presented to [facility] on  07/30/23 for slurred speech and difficulty getting words out. CT negative for acute findings. MRI pending.  PMH - significant for hypertrophic cardiomyopathy, HTN, DMII, CHF, OSA on CPAP, CKD stage IIIa, PE after knee surgery, essential tremor status post deep brain stimulator 2020, Hx of CVA.   Clinical Impression   Patient admitted for the diagnosis above.  PTA patient uses a 4WRW for mobility, occasional needs Min A for buttons and socks, continues to drive, and is able to manage iADL and medications.  Currently he is very close to baseline function, and will have supportive assist via his spouse.  No post acute OT is indicated.          If plan is discharge home, recommend the following: Assist for transportation;A little help with bathing/dressing/bathroom    Functional Status Assessment  Patient has not had a recent decline in their functional status  Equipment Recommendations  None recommended by OT    Recommendations for Other Services       Precautions / Restrictions Precautions Precautions: Fall Precaution Comments: R drop foot Restrictions Weight Bearing Restrictions: No      Mobility Bed Mobility Overal bed mobility: Modified Independent                  Transfers Overall transfer level: Needs assistance Equipment used: Rollator (4 wheels) Transfers: Sit to/from Stand, Bed to chair/wheelchair/BSC Sit to Stand: Supervision     Step pivot transfers: Supervision            Balance Overall balance assessment: History of Falls, Needs assistance Sitting-balance support: Feet supported Sitting balance-Leahy Scale: Good     Standing balance support: Reliant on assistive device for balance Standing balance-Leahy Scale: Fair                              ADL either performed or assessed with clinical judgement   ADL Overall ADL's : At baseline                                             Vision Patient Visual Report: No change from baseline       Perception Perception: Within Functional Limits       Praxis Praxis: WFL       Pertinent Vitals/Pain Pain Assessment Pain Assessment: No/denies pain     Extremity/Trunk Assessment Upper Extremity Assessment Upper Extremity Assessment: Overall WFL for tasks assessed;Right hand dominant;RUE deficits/detail;LUE deficits/detail RUE Deficits / Details: Baseline essential tremors RUE Coordination: decreased fine motor LUE Deficits / Details: Baseline essential tremors LUE Coordination: decreased fine motor   Lower Extremity Assessment Lower Extremity Assessment: Defer to PT evaluation   Cervical / Trunk Assessment Cervical / Trunk Assessment: Normal   Communication Communication Communication: Difficulty communicating thoughts/reduced clarity of speech   Cognition Arousal: Alert Behavior During Therapy: WFL for tasks assessed/performed Overall Cognitive Status: Within Functional Limits for tasks assessed                                       General Comments   VSS  on RA    Exercises     Shoulder Instructions      Home Living Family/patient expects to be discharged to:: Private residence Living Arrangements: Spouse/significant other Available Help at Discharge: Family;Available 24 hours/day Type of Home: House Home Access: Stairs to enter [employer] of Steps: 4-5 Entrance Stairs-Rails: Right Home Layout: One level     Bathroom Shower/Tub: Producer, television/film/video: Handicapped height Bathroom Accessibility: Yes   Home Equipment: Shower seat - built in;Grab bars - tub/shower;Hand held shower head;Cane - single point;Rollator (4 wheels);Rolling Walker (2 wheels)          Prior Functioning/Environment  Prior Level of Function : Independent/Modified Independent;Driving               ADLs Comments: Min A with lower body ADL and buttons due to tremors        OT Problem List: Impaired balance (sitting and/or standing)      OT Treatment/Interventions:      OT Goals(Current goals can be found in the care plan section) Acute Rehab OT Goals Patient Stated Goal: Return home OT Goal Formulation: With patient Time For Goal Achievement: 08/03/23 Potential to Achieve Goals: Good  OT Frequency:      Co-evaluation              AM-PAC OT "6 Clicks" Daily Activity     Outcome Measure Help from another person eating meals?: None Help from another person taking care of personal grooming?: None Help from another person toileting, which includes using toliet, bedpan, or urinal?: A Little Help from another person bathing (including washing, rinsing, drying)?: A Little Help from another person to put on and taking off regular upper body clothing?: A Little Help from another person to put on and taking off regular lower body clothing?: A Little 6 Click Score: 20   End of Session Equipment Utilized During Treatment: Gait belt;Rollator (4 wheels) Nurse Communication: Mobility status  Activity Tolerance: Patient tolerated treatment well Patient left: in chair;with call bell/phone within reach;with family/visitor present  OT Visit Diagnosis: Unsteadiness on feet (R26.81)                Time: 1140-1155 OT Time Calculation (min): 15 min Charges:  OT General Charges $OT Visit: 1 Visit OT Evaluation $OT Eval Moderate Complexity: 1 Mod  07/30/2023  RP, OTR/L  Acute Rehabilitation Services  Office:  [phone]   Suzanna Obey 07/30/2023, 12:57 PM

## 2023-07-30 NOTE — Hospital Course (Signed)
71 y.o. male with medical history significant for hypertension, type 2 diabetes mellitus, history of PE on Eliquis, OSA on CPAP, CKD 3B, history of CVA, and essential tremor with deep brain stimulator who presents with acute onset speech difficulty.   Patient reports that he had had an uneventful evening, was in bed, and had just turned on his CPAP machine when he had a vague sensation that something was wrong.  While trying to relate this to his wife, he was noted to have slurred speech.  He has also been experiencing difficulty getting the words out.  He denies any new focal numbness or weakness.  He reports difficulty with balance that he attributes to his prior stroke.  He denies chest pain, shortness of breath, leg swelling, fever, or chills.

## 2023-07-30 NOTE — Progress Notes (Addendum)
STROKE TEAM PROGRESS NOTE   BRIEF HPI [name] is a 72 y.o. male with history of T2DM, PE on Eliquis, OSA, CKD IIIB, history of CVA and essential tremor well-controlled with DBS, who presents with acute-onset aphasia and confusion. Denied further symptoms. CT Head negative. Follow-up MRI brain scheduled for 9/5 delayed by issues concerning patients DBS.    SIGNIFICANT HOSPITAL EVENTS 9/5: presentation, CT head negative, A1c 6.6  INTERIM HISTORY/SUBJECTIVE  NAEON. No PRNs. No refusals. On interview with wife in room, patient said that last night he suddenly felt that "weird stuff" was going on.  He has had multiple prior stroke and was convinced he was having another 1 and 16 for evaluation.  He returned to baseline at present. Patient has trouble ambulating (R foot drop) since CVA 03/2021, when he was started on Eliquis. Takes medication regularly.  Patient had a brief episode yesterday where his speech was gibberish and he was not quite responsive.  Wife admits that for the last 6 months there have been several episodes where he has been straining and not been quite responsive.  There has been no overt documentation of seizures.  We reviewed that the brain stimulation and Parkinson's.  Amenable to EEG and MRI, both scheduled today. Patient has course action tremor of R hand.  He has history of essential tremor s/p deep brain stimulator implant and follows with Dr. Arbutus Leas will  OBJECTIVE  CBC    Component Value Date/Time   WBC 7.0 07/30/2023 0515   RBC 3.73 (L) 07/30/2023 0515   HGB 11.2 (L) 07/30/2023 0515   HGB 12.2 (L) 06/25/2023 1000   HCT 34.5 (L) 07/30/2023 0515   HCT 37.8 06/25/2023 1000   PLT 186 07/30/2023 0515   PLT 278 06/25/2023 1000   MCV 92.5 07/30/2023 0515   MCV 90 06/25/2023 1000   MCH 30.0 07/30/2023 0515   MCHC 32.5 07/30/2023 0515   RDW 14.5 07/30/2023 0515   RDW 14.2 06/25/2023 1000   LYMPHSABS 4.4 (H) 07/30/2023 0113   MONOABS 0.6 07/30/2023 0113    EOSABS 0.4 07/30/2023 0113   BASOSABS 0.1 07/30/2023 0113    BMET    Component Value Date/Time   NA 138 07/30/2023 0515   NA 141 06/25/2023 1000   K 4.9 07/30/2023 0515   CL 108 07/30/2023 0515   CO2 24 07/30/2023 0515   GLUCOSE 146 (H) 07/30/2023 0515   BUN 20 07/30/2023 0515   BUN 28 (H) 06/25/2023 1000   CREATININE 2.17 (H) 07/30/2023 0515   CALCIUM 8.5 (L) 07/30/2023 0515   EGFR 34 (L) 06/25/2023 1000   GFRNONAA 31 (L) 07/30/2023 0515    IMAGING past 24 hours DG Chest Portable 1 View  Result Date: 07/30/2023 CLINICAL DATA:  Hypoxia EXAM: PORTABLE CHEST 1 VIEW COMPARISON:  07/30/2022 FINDINGS: Heart and mediastinal contours within normal limits. Low lung volumes, bibasilar atelectasis. No effusions or pneumothorax. No acute bony abnormality. Right upper chest wall stimulator battery pack again noted, unchanged. IMPRESSION: Low lung volumes, bibasilar atelectasis. Electronically Signed   By: Charlett Nose M.D.   On: 07/30/2023 03:43   CT HEAD CODE STROKE WO CONTRAST  Result Date: 07/30/2023 CLINICAL DATA:  Initial evaluation for neuro deficit, stroke, facial droop. EXAM: CT ANGIOGRAPHY HEAD AND NECK TECHNIQUE: Multidetector CT imaging of the head and neck was performed using the standard protocol during bolus administration of intravenous contrast. Multiplanar CT image reconstructions and MIPs were obtained to evaluate the vascular anatomy. Carotid stenosis measurements (  when applicable) are obtained utilizing NASCET criteria, using the distal internal carotid diameter as the denominator. RADIATION DOSE REDUCTION: This exam was performed according to the departmental dose-optimization program which includes automated exposure control, adjustment of the mA and/or kV according to patient size and/or use of iterative reconstruction technique. CONTRAST:  75mL OMNIPAQUE IOHEXOL 350 MG/ML SOLN COMPARISON:  Prior study from 04/22/2021. FINDINGS: CT HEAD FINDINGS Brain: Atrophy with moderate  chronic microvascular ischemic disease. Remote lacunar infarct present at the right basal ganglia. Left-sided DBS electrode in place. No acute intracranial hemorrhage. No acute large vessel territory infarct. No mass lesion or midline shift. No hydrocephalus or extra-axial fluid collection. Vascular: No abnormal hyperdense vessel. Skull: No acute scalp soft tissue abnormality.  Calvarium intact. Sinuses/Orbits: Globes and orbital soft tissues demonstrate no acute finding. Retained metallic density present anterior to the left lobe (series 4, image 19). Paranasal sinuses are clear. No mastoid effusion. Other: None. ASPECTS (Alberta Stroke Program Early CT Score) - Ganglionic level infarction (caudate, lentiform nuclei, internal capsule, insula, M1-M3 cortex): 7 - Supraganglionic infarction (M4-M6 cortex): 3 Total score (0-10 with 10 being normal): 10 - CTA NECK FINDINGS Aortic arch: Visualized aortic arch within normal limits for caliber. Bovine branching pattern noted. Scattered plaque about the arch and origin of the great vessels without hemodynamically significant stenosis. Specific note is made of some soft plaque/thrombus protruding into the lumen of the proximal left subclavian artery (series 7, image 341). Right carotid system: Right common and internal carotid arteries are tortuous without dissection. Mild atheromatous change about the right carotid bulb without hemodynamically significant stenosis. Left carotid system: Left common and internal carotid arteries are tortuous but patent without dissection. Moderate atheromatous change about the left carotid bulb without hemodynamically significant stenosis. Vertebral arteries: Both vertebral arteries arise from subclavian arteries. Vertebral arteries are mildly tortuous but patent without stenosis or dissection. Skeleton: No discrete or worrisome osseous lesions. Other neck: No acute finding. Upper chest: No acute finding.  Right-sided pacemaker/AICD in place.  Review of the MIP images confirms the above findings CTA HEAD FINDINGS Anterior circulation: Moderate atheromatous change about the carotid siphons without hemodynamically significant stenosis. A1 segments patent bilaterally. Right A1 hypoplastic. Normal anterior communicating artery complex. Anterior cerebral arteries patent without stenosis. Normal in stenosis or occlusion. No proximal MCA branch occlusion or high-grade stenosis. Distal MCA branches perfused and symmetric. Posterior circulation: Both V4 segments patent without stenosis. Right PICA patent. Left PICA not seen. Basilar widely patent. Superior cerebellar and posterior cerebral arteries patent bilaterally. Venous sinuses: Patent allowing for timing the contrast bolus. Anatomic variants: As above. Intracranial circulation is somewhat diffusely dolichoectatic in appearance. No aneurysm. Review of the MIP images confirms the above findings IMPRESSION: CT HEAD: 1. No acute intracranial abnormality. 2. Aspects is 10. 3. Atrophy with moderate chronic small vessel ischemic disease, with remote right basal ganglia lacunar infarct. CTA HEAD AND NECK: 1. Negative CTA for large vessel occlusion or other emergent finding. 2. Moderate atheromatous change about the carotid bifurcations and carotid siphons without hemodynamically significant stenosis. 3. Diffuse tortuosity of the major arterial vasculature of the head and neck, suggesting chronic underlying hypertension. These results were communicated to Dr. Amada Jupiter at 1:36 am on 07/30/2023 by text page via the [facility] messaging system. Electronically Signed   By: Rise Mu M.D.   On: 07/30/2023 01:51   CT ANGIO HEAD NECK W WO CM (CODE STROKE)  Result Date: 07/30/2023 CLINICAL DATA:  Initial evaluation for neuro deficit, stroke, facial droop. EXAM:  CT ANGIOGRAPHY HEAD AND NECK TECHNIQUE: Multidetector CT imaging of the head and neck was performed using the standard protocol during bolus administration  of intravenous contrast. Multiplanar CT image reconstructions and MIPs were obtained to evaluate the vascular anatomy. Carotid stenosis measurements (when applicable) are obtained utilizing NASCET criteria, using the distal internal carotid diameter as the denominator. RADIATION DOSE REDUCTION: This exam was performed according to the departmental dose-optimization program which includes automated exposure control, adjustment of the mA and/or kV according to patient size and/or use of iterative reconstruction technique. CONTRAST:  75mL OMNIPAQUE IOHEXOL 350 MG/ML SOLN COMPARISON:  Prior study from 04/22/2021. FINDINGS: CT HEAD FINDINGS Brain: Atrophy with moderate chronic microvascular ischemic disease. Remote lacunar infarct present at the right basal ganglia. Left-sided DBS electrode in place. No acute intracranial hemorrhage. No acute large vessel territory infarct. No mass lesion or midline shift. No hydrocephalus or extra-axial fluid collection. Vascular: No abnormal hyperdense vessel. Skull: No acute scalp soft tissue abnormality.  Calvarium intact. Sinuses/Orbits: Globes and orbital soft tissues demonstrate no acute finding. Retained metallic density present anterior to the left lobe (series 4, image 19). Paranasal sinuses are clear. No mastoid effusion. Other: None. ASPECTS (Alberta Stroke Program Early CT Score) - Ganglionic level infarction (caudate, lentiform nuclei, internal capsule, insula, M1-M3 cortex): 7 - Supraganglionic infarction (M4-M6 cortex): 3 Total score (0-10 with 10 being normal): 10 - CTA NECK FINDINGS Aortic arch: Visualized aortic arch within normal limits for caliber. Bovine branching pattern noted. Scattered plaque about the arch and origin of the great vessels without hemodynamically significant stenosis. Specific note is made of some soft plaque/thrombus protruding into the lumen of the proximal left subclavian artery (series 7, image 341). Right carotid system: Right common and  internal carotid arteries are tortuous without dissection. Mild atheromatous change about the right carotid bulb without hemodynamically significant stenosis. Left carotid system: Left common and internal carotid arteries are tortuous but patent without dissection. Moderate atheromatous change about the left carotid bulb without hemodynamically significant stenosis. Vertebral arteries: Both vertebral arteries arise from subclavian arteries. Vertebral arteries are mildly tortuous but patent without stenosis or dissection. Skeleton: No discrete or worrisome osseous lesions. Other neck: No acute finding. Upper chest: No acute finding.  Right-sided pacemaker/AICD in place. Review of the MIP images confirms the above findings CTA HEAD FINDINGS Anterior circulation: Moderate atheromatous change about the carotid siphons without hemodynamically significant stenosis. A1 segments patent bilaterally. Right A1 hypoplastic. Normal anterior communicating artery complex. Anterior cerebral arteries patent without stenosis. Normal in stenosis or occlusion. No proximal MCA branch occlusion or high-grade stenosis. Distal MCA branches perfused and symmetric. Posterior circulation: Both V4 segments patent without stenosis. Right PICA patent. Left PICA not seen. Basilar widely patent. Superior cerebellar and posterior cerebral arteries patent bilaterally. Venous sinuses: Patent allowing for timing the contrast bolus. Anatomic variants: As above. Intracranial circulation is somewhat diffusely dolichoectatic in appearance. No aneurysm. Review of the MIP images confirms the above findings IMPRESSION: CT HEAD: 1. No acute intracranial abnormality. 2. Aspects is 10. 3. Atrophy with moderate chronic small vessel ischemic disease, with remote right basal ganglia lacunar infarct. CTA HEAD AND NECK: 1. Negative CTA for large vessel occlusion or other emergent finding. 2. Moderate atheromatous change about the carotid bifurcations and carotid  siphons without hemodynamically significant stenosis. 3. Diffuse tortuosity of the major arterial vasculature of the head and neck, suggesting chronic underlying hypertension. These results were communicated to Dr. Amada Jupiter at 1:36 am on 07/30/2023 by text page via the [facility]  messaging system. Electronically Signed   By: Rise Mu M.D.   On: 07/30/2023 01:51    Vitals:   07/30/23 0515 07/30/23 0545 07/30/23 0735 07/30/23 1335  BP: 114/69 136/83 139/89 122/80  Pulse: 96 98 90 80  Resp: (!) 22 20 19 20   Temp: (!) 97.5 F (36.4 C) 97.9 F (36.6 C) 97.8 F (36.6 C) 97.6 F (36.4 C)  TempSrc: Temporal Oral Oral Oral  SpO2: 98% 100% 100% 100%  Weight:      Height:         PHYSICAL EXAM General:  Alert, well-nourished, well-developed patient in no acute distress Psych:  Mood and affect appropriate for situation CV: Regular rate and rhythm on monitor Respiratory:  Regular, unlabored respirations on room air GI: Abdomen soft and nontender   NEURO:  Mental Status: AA&Ox3, patient is able to give clear and coherent history Speech/Language: speech has mild dysarthria.  Naming, repetition, fluency, and comprehension intact.  Cranial Nerves:  II: Visual fields full.  III, IV, VI: EOMI. Eyelids elevate symmetrically.  V: Sensation is intact to light touch and symmetrical to face.  VII: Face is symmetrical resting and smiling VIII: hearing intact to voice. IX, X: Palate elevates symmetrically. Phonation is normal.  [id number] shrug 5/5. XII: tongue is midline without fasciculations. Motor: RLE 3/5 strength, otherwise 5/5 strength to all muscle groups tested, coarse action tremor of R hand which worsens with intention Tone: is normal and bulk is normal Sensation: Intact to light touch bilaterally. Extinction absent to light touch to DSS.   Coordination: FTN intact, limited due to known tremor, HKS: no ataxia in BLE.No drift.  Gait: deferred   ASSESSMENT/PLAN  Rapid-onset  aphasia and confusion ISO stroke history, resolving Etiology: Acute infarct vs new seizure  He has prior history of multiple strokes and was placed on Eliquis and 2020 due to left subclavian thrombus and on follow-up imaging in 2021 he still had some persistent thrombus with it appears to have resolved and the present CT angio and he only has a soft plaque Awaiting further workup of new aphasia with negative CT. Differential now between acute ischemic stroke vs seizure. Patient appears close to baseline today. Due to issues with DBS implant, may need to wait until tomorrow to schedule MRI brain. EEG complete, awaiting read. No new complaints.   Code Stroke CT head:  No acute abnormality. Small vessel disease. Atrophy with moderate chronic small vessel ischemic disease and remote right basal ganglia lacunar infarct. ASPECTS 10.    CTA head & neck: negative for large vessel occlusion. Moderate atheromatous change of carotid bifurcations wihtout significant stenosis.  EEG: ordered, awaiting results MRI Brain wc: Awaiting. Unable to proceed until deep brain stimulator is set to MRI mode. Wrong remote charger was brought, delaying MRI.  LDL 53 HgbA1c 6.6 VTE prophylaxis - home aspirin and subcutaneous heparin, held home eliquis ISO upcoming MRI  Therapy recommendations:  No follow up needed Disposition:  Home with wife  Hx of Stroke/TIA Dates back to 2013 -- most recent 04/17/2021 with lasting foot drop and mild dysarthria at baseline, started on eliquis 5 BID at that time.   Hypertension Home meds:  Norvasc 10, losartan 100, metoprolol tartrate 50 every day, all resumed in hospital Stable Blood Pressure Goal: SBP less than 160   Hyperlipidemia Home meds:  Atorvastatin 40 mg, ezitimibe 10, fenofibrate 54 mg, atorvastatin resumed in hospital LDL 53, goal < 70 Continue statin at discharge  Diabetes type II Controlled Home meds:  insulin glargine 70u BID,  HgbA1c 6.6, goal <  7.0 CBGs SSI Recommend close follow-up with PCP for better DM control  Tobacco Abuse Former, 10 pack years   Other Stroke Risk Factors Family hx stroke  Coronary artery disease Advanced age  Hospital day # 0  STROKE MD NOTE :  I have personally obtained history,examined this patient, reviewed notes, independently viewed imaging studies, participated in medical decision making and plan of care.ROS completed by me personally and pertinent positives fully documented  I have made any additions or clarifications directly to the above note. Agree with note above.  Patient presented with transient episode of garbled speech and some altered consciousness possible left hemispheric TIA versus complex partial seizure.  Recommend check MRI and EEG.  Continue ongoing stroke workup.  Aggressive risk factor modification.  Patient has been on Eliquis since 2020 for left subclavian clot however present CT angiogram shows resolution of the clot with only mild soft plaque in that location hence recommend discontinuing Eliquis and switching to dual antiplatelet therapy for 3 months and then aspirin alone.  Long discussion with patient and wife at the bedside and answered questions.  We we will discuss with Dr. Deno Etienne.  Greater than 50% time during this 50-minute visit was spent on counseling and coordination of care about his episode of transient aphasia and confusion and differential diagnosis of TIA versus seizure and treatment options for resolved subclavian clot and answering questions. Delia Heady, MD Medical Director [facility] Pager: [phone] 07/30/2023 3:51 PM   To contact Stroke Continuity provider, please refer to [email or web address]. After hours, contact General Neurology

## 2023-07-30 NOTE — ED Notes (Signed)
Pt instructed to have brain stimulator remote and chargers prior to MRI. Pt states his wife will bring them later today.

## 2023-07-30 NOTE — ED Notes (Signed)
ED TO INPATIENT HANDOFF REPORT  ED Nurse Name and Phone #: Juliette Alcide RN [phone]  S Name/Age/Gender Joshua [name] 72 y.o. male Room/Bed: TRAAC/TRAAC  Code Status   Code Status: Full Code  Home/SNF/Other Home Patient oriented to: self, place, time, and situation Is this baseline? Yes   Triage Complete: Triage complete  Chief Complaint Dysarthria [R47.1]  Triage Note Pt BIB [facility] from home. Pt having sudden onset of slurred speech and facial droop. LSN 2330.   Pt has hx of stroke and multiple brain surgeries.    Allergies Allergies  Allergen Reactions   Melatonin Other (See Comments)    dizzy    Level of Care/Admitting Diagnosis ED Disposition     ED Disposition  Admit   Condition  --   Comment  Hospital Area: [facility] [[id number]]  Level of Care: Telemetry Medical [104]  May place patient in observation at [facility] or [facility] if equivalent level of care is available:: No  Covid Evaluation: Confirmed COVID Negative  Diagnosis: Dysarthria [784.51.ICD-9-CM]  Admitting Physician: Briscoe Deutscher [[id number]]  Attending Physician: Briscoe Deutscher [[id number]]          B Medical/Surgery History Past Medical History:  Diagnosis Date   Benign essential tremor    Benign positional vertigo    Chronic kidney disease 08/28/2019   CVA (cerebral vascular accident) (HCC)    x2 - L retina, 1 right parietal   Degenerative arthritis    Depression    Diabetes mellitus    DVT (deep venous thrombosis) (HCC) 2018   Dyslipidemia    GERD (gastroesophageal reflux disease)    hiatal hernia   Gout    H/O: vasectomy    Hearing aid worn    b/l   Hx of appendectomy    Hx of tonsillectomy    Hypertension    Hypertrophic cardiomyopathy (HCC)    Ischemic optic neuropathy    on the left   Melanoma (HCC)    NSVT (nonsustained ventricular tachycardia) (HCC)    1 4 beat run on event monitor in 07/2020   Obesity    OSA on CPAP    setting = 5   Pulmonary  emboli (HCC) 2018   PVC's (premature ventricular contractions)    SVT (supraventricular tachycardia)    by event monitor   Tremor, essential 06/22/2017   Wears glasses    Past Surgical History:  Procedure Laterality Date   APPENDECTOMY     arthroscopic knee surgery Bilateral    CATARACT EXTRACTION Bilateral    COLONOSCOPY     MINOR PLACEMENT OF FIDUCIAL N/A 06/30/2019   Procedure: Fiducial placement;  Surgeon: Maeola Harman, MD;  Location: [facility] OR;  Service: Neurosurgery;  Laterality: N/A;  Fiducial placement   NASAL SEPTUM SURGERY     PULSE GENERATOR IMPLANT N/A 07/14/2019   Procedure: Left cranial Implanted Pulse Generator and lead extension placement to right chest ;  Surgeon: Maeola Harman, MD;  Location: [facility] OR;  Service: Neurosurgery;  Laterality: N/A;   SUBTHALAMIC STIMULATOR INSERTION Left 07/07/2019   Procedure: LEFT DEEP BRAIN STIMULATOR PLACEMENT;  Surgeon: Maeola Harman, MD;  Location: [facility] OR;  Service: Neurosurgery;  Laterality: Left;   TEE WITHOUT CARDIOVERSION N/A 04/23/2021   Procedure: TRANSESOPHAGEAL ECHOCARDIOGRAM (TEE);  Surgeon: Elease Hashimoto Deloris Ping, MD;  Location: [facility] ENDOSCOPY;  Service: Cardiovascular;  Laterality: N/A;   TONSILLECTOMY     TOTAL KNEE ARTHROPLASTY Left 03/30/2017   Procedure: LEFT TOTAL KNEE ARTHROPLASTY;  Surgeon: Durene Romans, MD;  Location: [facility] ORS;  Service: Orthopedics;  Laterality: Left;   WISDOM TOOTH EXTRACTION       A IV Location/Drains/Wounds Patient Lines/Drains/Airways Status     Active Line/Drains/Airways     Name Placement date Placement time Site Days   Peripheral IV 07/30/23 18 G Posterior;Left Forearm 07/30/23  --  Forearm  less than 1            Intake/Output Last 24 hours No intake or output data in the 24 hours ending 07/30/23 0449  Labs/Imaging Results for orders placed or performed during the hospital encounter of 07/30/23 (from the past 48 hour(s))  CBG monitoring, ED     Status: Abnormal   Collection Time: 07/30/23  1:09  AM  Result Value Ref Range   Glucose-Capillary 142 (H) 70 - 99 mg/dL    Comment: Glucose reference range applies only to samples taken after fasting for at least 8 hours.  Protime-INR     Status: None   Collection Time: 07/30/23  1:13 AM  Result Value Ref Range   Prothrombin Time 14.5 11.4 - 15.2 seconds   INR 1.1 0.8 - 1.2    Comment: (NOTE) INR goal varies based on device and disease states. Performed at [facility], [address] [place], [place] [address]   APTT     Status: None   Collection Time: 07/30/23  1:13 AM  Result Value Ref Range   aPTT 33 24 - 36 seconds    Comment: Performed at [facility], [address], [place], [place] [address]  CBC     Status: Abnormal   Collection Time: 07/30/23  1:13 AM  Result Value Ref Range   WBC 9.2 4.0 - 10.5 K/uL   RBC 3.79 (L) 4.22 - 5.81 MIL/uL   Hemoglobin 11.3 (L) 13.0 - 17.0 g/dL   HCT 46.9 (L) 62.9 - 52.8 %   MCV 92.1 80.0 - 100.0 fL   MCH 29.8 26.0 - 34.0 pg   MCHC 32.4 30.0 - 36.0 g/dL   RDW 41.3 24.4 - 01.0 %   Platelets 216 150 - 400 K/uL   nRBC 0.0 0.0 - 0.2 %    Comment: Performed at [facility], [address] [place], [place] [address]  Differential     Status: Abnormal   Collection Time: 07/30/23  1:13 AM  Result Value Ref Range   Neutrophils Relative % 40 %   Neutro Abs 3.7 1.7 - 7.7 K/uL   Lymphocytes Relative 47 %   Lymphs Abs 4.4 (H) 0.7 - 4.0 K/uL   Monocytes Relative 7 %   Monocytes Absolute 0.6 0.1 - 1.0 K/uL   Eosinophils Relative 5 %   Eosinophils Absolute 0.4 0.0 - 0.5 K/uL   Basophils Relative 1 %   Basophils Absolute 0.1 0.0 - 0.1 K/uL   Immature Granulocytes 0 %   Abs Immature Granulocytes 0.03 0.00 - 0.07 K/uL    Comment: Performed at [facility], [address] [place], [place] [address]  Comprehensive metabolic panel     Status: Abnormal   Collection Time: 07/30/23  1:13 AM  Result Value Ref Range   Sodium 140 135 - 145 mmol/L   Potassium 4.7 3.5 - 5.1  mmol/L   Chloride 104 98 - 111 mmol/L   CO2 24 22 - 32 mmol/L   Glucose, Bld 152 (H) 70 - 99 mg/dL    Comment: Glucose reference range applies only to samples taken  after fasting for at least 8 hours.   BUN 21 8 - 23 mg/dL   Creatinine, Ser 4.40 (H) 0.61 - 1.24 mg/dL   Calcium 8.5 (L) 8.9 - 10.3 mg/dL   Total Protein 5.7 (L) 6.5 - 8.1 g/dL   Albumin 3.1 (L) 3.5 - 5.0 g/dL   AST 29 15 - 41 U/L   ALT 22 0 - 44 U/L   Alkaline Phosphatase 67 38 - 126 U/L   Total Bilirubin 0.4 0.3 - 1.2 mg/dL   GFR, Estimated 32 (L) >60 mL/min    Comment: (NOTE) Calculated using the CKD-EPI Creatinine Equation (2021)    Anion gap 12 5 - 15    Comment: Performed at [facility], [address] [place], [place] [address]  Ethanol     Status: None   Collection Time: 07/30/23  1:13 AM  Result Value Ref Range   Alcohol, Ethyl (B) <10 <10 mg/dL    Comment: (NOTE) Lowest detectable limit for serum alcohol is 10 mg/dL.  For medical purposes only. Performed at [facility], [address], [place], [place] [address]   I-stat chem 8, ED     Status: Abnormal   Collection Time: 07/30/23  1:14 AM  Result Value Ref Range   Sodium 142 135 - 145 mmol/L   Potassium 4.7 3.5 - 5.1 mmol/L   Chloride 106 98 - 111 mmol/L   BUN 25 (H) 8 - 23 mg/dL   Creatinine, Ser 8.75 (H) 0.61 - 1.24 mg/dL   Glucose, Bld 643 (H) 70 - 99 mg/dL    Comment: Glucose reference range applies only to samples taken after fasting for at least 8 hours.   Calcium, Ion 1.13 (L) 1.15 - 1.40 mmol/L   TCO2 26 22 - 32 mmol/L   Hemoglobin 11.2 (L) 13.0 - 17.0 g/dL   HCT 32.9 (L) 51.8 - 84.1 %  Resp panel by RT-PCR (RSV, Flu A&B, Covid) Anterior Nasal Swab     Status: None   Collection Time: 07/30/23  1:36 AM   Specimen: Anterior Nasal Swab  Result Value Ref Range   SARS Coronavirus 2 by RT PCR NEGATIVE NEGATIVE   Influenza A by PCR NEGATIVE NEGATIVE   Influenza B by PCR NEGATIVE NEGATIVE    Comment: (NOTE) The Xpert Xpress  SARS-CoV-2/FLU/RSV plus assay is intended as an aid in the diagnosis of influenza from Nasopharyngeal swab specimens and should not be used as a sole basis for treatment. Nasal washings and aspirates are unacceptable for Xpert Xpress SARS-CoV-2/FLU/RSV testing.  Fact Sheet for Patients: BloggerCourse.com  Fact Sheet for Healthcare Providers: SeriousBroker.it  This test is not yet approved or cleared by the [place] FDA and has been authorized for detection and/or diagnosis of SARS-CoV-2 by FDA under an Emergency Use Authorization (EUA). This EUA will remain in effect (meaning this test can be used) for the duration of the COVID-19 declaration under Section 564(b)(1) of the Act, 21 U.S.C. section 360bbb-3(b)(1), unless the authorization is terminated or revoked.     Resp Syncytial Virus by PCR NEGATIVE NEGATIVE    Comment: (NOTE) Fact Sheet for Patients: BloggerCourse.com  Fact Sheet for Healthcare Providers: SeriousBroker.it  This test is not yet approved or cleared by the [place] FDA and has been authorized for detection and/or diagnosis of SARS-CoV-2 by FDA under an Emergency Use Authorization (EUA). This EUA will remain in effect (meaning this test can be used) for the duration of the COVID-19 declaration under Section  564(b)(1) of the Act, 21 U.S.C. section 360bbb-3(b)(1), unless the authorization is terminated or revoked.  Performed at [facility], [address], [place], [place] [address]    DG Chest Portable 1 View  Result Date: 07/30/2023 CLINICAL DATA:  Hypoxia EXAM: PORTABLE CHEST 1 VIEW COMPARISON:  07/30/2022 FINDINGS: Heart and mediastinal contours within normal limits. Low lung volumes, bibasilar atelectasis. No effusions or pneumothorax. No acute bony abnormality. Right upper chest wall stimulator battery pack again noted, unchanged. IMPRESSION:  Low lung volumes, bibasilar atelectasis. Electronically Signed   By: Charlett Nose M.D.   On: 07/30/2023 03:43   CT HEAD CODE STROKE WO CONTRAST  Result Date: 07/30/2023 CLINICAL DATA:  Initial evaluation for neuro deficit, stroke, facial droop. EXAM: CT ANGIOGRAPHY HEAD AND NECK TECHNIQUE: Multidetector CT imaging of the head and neck was performed using the standard protocol during bolus administration of intravenous contrast. Multiplanar CT image reconstructions and MIPs were obtained to evaluate the vascular anatomy. Carotid stenosis measurements (when applicable) are obtained utilizing NASCET criteria, using the distal internal carotid diameter as the denominator. RADIATION DOSE REDUCTION: This exam was performed according to the departmental dose-optimization program which includes automated exposure control, adjustment of the mA and/or kV according to patient size and/or use of iterative reconstruction technique. CONTRAST:  75mL OMNIPAQUE IOHEXOL 350 MG/ML SOLN COMPARISON:  Prior study from 04/22/2021. FINDINGS: CT HEAD FINDINGS Brain: Atrophy with moderate chronic microvascular ischemic disease. Remote lacunar infarct present at the right basal ganglia. Left-sided DBS electrode in place. No acute intracranial hemorrhage. No acute large vessel territory infarct. No mass lesion or midline shift. No hydrocephalus or extra-axial fluid collection. Vascular: No abnormal hyperdense vessel. Skull: No acute scalp soft tissue abnormality.  Calvarium intact. Sinuses/Orbits: Globes and orbital soft tissues demonstrate no acute finding. Retained metallic density present anterior to the left lobe (series 4, image 19). Paranasal sinuses are clear. No mastoid effusion. Other: None. ASPECTS ([place]ta Stroke Program Early CT Score) - Ganglionic level infarction (caudate, lentiform nuclei, internal capsule, insula, M1-M3 cortex): 7 - Supraganglionic infarction (M4-M6 cortex): 3 Total score (0-10 with 10 being normal): 10 -  CTA NECK FINDINGS Aortic arch: Visualized aortic arch within normal limits for caliber. Bovine branching pattern noted. Scattered plaque about the arch and origin of the great vessels without hemodynamically significant stenosis. Specific note is made of some soft plaque/thrombus protruding into the lumen of the proximal left subclavian artery (series 7, image 341). Right carotid system: Right common and internal carotid arteries are tortuous without dissection. Mild atheromatous change about the right carotid bulb without hemodynamically significant stenosis. Left carotid system: Left common and internal carotid arteries are tortuous but patent without dissection. Moderate atheromatous change about the left carotid bulb without hemodynamically significant stenosis. Vertebral arteries: Both vertebral arteries arise from subclavian arteries. Vertebral arteries are mildly tortuous but patent without stenosis or dissection. Skeleton: No discrete or worrisome osseous lesions. Other neck: No acute finding. Upper chest: No acute finding.  Right-sided pacemaker/AICD in place. Review of the MIP images confirms the above findings CTA HEAD FINDINGS Anterior circulation: Moderate atheromatous change about the carotid siphons without hemodynamically significant stenosis. A1 segments patent bilaterally. Right A1 hypoplastic. Normal anterior communicating artery complex. Anterior cerebral arteries patent without stenosis. Normal in stenosis or occlusion. No proximal MCA branch occlusion or high-grade stenosis. Distal MCA branches perfused and symmetric. Posterior circulation: Both V4 segments patent without stenosis. Right PICA patent. Left PICA not seen. Basilar widely patent. Superior cerebellar and posterior cerebral arteries  patent bilaterally. Venous sinuses: Patent allowing for timing the contrast bolus. Anatomic variants: As above. Intracranial circulation is somewhat diffusely dolichoectatic in appearance. No aneurysm.  Review of the MIP images confirms the above findings IMPRESSION: CT HEAD: 1. No acute intracranial abnormality. 2. Aspects is 10. 3. Atrophy with moderate chronic small vessel ischemic disease, with remote right basal ganglia lacunar infarct. CTA HEAD AND NECK: 1. Negative CTA for large vessel occlusion or other emergent finding. 2. Moderate atheromatous change about the carotid bifurcations and carotid siphons without hemodynamically significant stenosis. 3. Diffuse tortuosity of the major arterial vasculature of the head and neck, suggesting chronic underlying hypertension. These results were communicated to Dr. Amada Jupiter at 1:36 am on 07/30/2023 by text page via the [facility] messaging system. Electronically Signed   By: Rise Mu M.D.   On: 07/30/2023 01:51   CT ANGIO HEAD NECK W WO CM (CODE STROKE)  Result Date: 07/30/2023 CLINICAL DATA:  Initial evaluation for neuro deficit, stroke, facial droop. EXAM: CT ANGIOGRAPHY HEAD AND NECK TECHNIQUE: Multidetector CT imaging of the head and neck was performed using the standard protocol during bolus administration of intravenous contrast. Multiplanar CT image reconstructions and MIPs were obtained to evaluate the vascular anatomy. Carotid stenosis measurements (when applicable) are obtained utilizing NASCET criteria, using the distal internal carotid diameter as the denominator. RADIATION DOSE REDUCTION: This exam was performed according to the departmental dose-optimization program which includes automated exposure control, adjustment of the mA and/or kV according to patient size and/or use of iterative reconstruction technique. CONTRAST:  75mL OMNIPAQUE IOHEXOL 350 MG/ML SOLN COMPARISON:  Prior study from 04/22/2021. FINDINGS: CT HEAD FINDINGS Brain: Atrophy with moderate chronic microvascular ischemic disease. Remote lacunar infarct present at the right basal ganglia. Left-sided DBS electrode in place. No acute intracranial hemorrhage. No acute large  vessel territory infarct. No mass lesion or midline shift. No hydrocephalus or extra-axial fluid collection. Vascular: No abnormal hyperdense vessel. Skull: No acute scalp soft tissue abnormality.  Calvarium intact. Sinuses/Orbits: Globes and orbital soft tissues demonstrate no acute finding. Retained metallic density present anterior to the left lobe (series 4, image 19). Paranasal sinuses are clear. No mastoid effusion. Other: None. ASPECTS ([place] Stroke Program Early CT Score) - Ganglionic level infarction (caudate, lentiform nuclei, internal capsule, insula, M1-M3 cortex): 7 - Supraganglionic infarction (M4-M6 cortex): 3 Total score (0-10 with 10 being normal): 10 - CTA NECK FINDINGS Aortic arch: Visualized aortic arch within normal limits for caliber. Bovine branching pattern noted. Scattered plaque about the arch and origin of the great vessels without hemodynamically significant stenosis. Specific note is made of some soft plaque/thrombus protruding into the lumen of the proximal left subclavian artery (series 7, image 341). Right carotid system: Right common and internal carotid arteries are tortuous without dissection. Mild atheromatous change about the right carotid bulb without hemodynamically significant stenosis. Left carotid system: Left common and internal carotid arteries are tortuous but patent without dissection. Moderate atheromatous change about the left carotid bulb without hemodynamically significant stenosis. Vertebral arteries: Both vertebral arteries arise from subclavian arteries. Vertebral arteries are mildly tortuous but patent without stenosis or dissection. Skeleton: No discrete or worrisome osseous lesions. Other neck: No acute finding. Upper chest: No acute finding.  Right-sided pacemaker/AICD in place. Review of the MIP images confirms the above findings CTA HEAD FINDINGS Anterior circulation: Moderate atheromatous change about the carotid siphons without hemodynamically  significant stenosis. A1 segments patent bilaterally. Right A1 hypoplastic. Normal anterior communicating artery complex. Anterior cerebral arteries patent without stenosis.  Normal in stenosis or occlusion. No proximal MCA branch occlusion or high-grade stenosis. Distal MCA branches perfused and symmetric. Posterior circulation: Both V4 segments patent without stenosis. Right PICA patent. Left PICA not seen. Basilar widely patent. Superior cerebellar and posterior cerebral arteries patent bilaterally. Venous sinuses: Patent allowing for timing the contrast bolus. Anatomic variants: As above. Intracranial circulation is somewhat diffusely dolichoectatic in appearance. No aneurysm. Review of the MIP images confirms the above findings IMPRESSION: CT HEAD: 1. No acute intracranial abnormality. 2. Aspects is 10. 3. Atrophy with moderate chronic small vessel ischemic disease, with remote right basal ganglia lacunar infarct. CTA HEAD AND NECK: 1. Negative CTA for large vessel occlusion or other emergent finding. 2. Moderate atheromatous change about the carotid bifurcations and carotid siphons without hemodynamically significant stenosis. 3. Diffuse tortuosity of the major arterial vasculature of the head and neck, suggesting chronic underlying hypertension. These results were communicated to Dr. Amada Jupiter at 1:36 am on 07/30/2023 by text page via the [facility] messaging system. Electronically Signed   By: Rise Mu M.D.   On: 07/30/2023 01:51    Pending Labs Unresulted Labs (From admission, onward)     Start     Ordered   07/30/23 0500  Hemoglobin A1c  Tomorrow morning,   R       Comments: To assess prior glycemic control    07/30/23 0443   07/30/23 0500  Basic metabolic panel  Daily,   R      07/30/23 0443   07/30/23 0500  CBC  Daily,   R      07/30/23 0443            Vitals/Pain Today's Vitals   07/30/23 0138 07/30/23 0145 07/30/23 0200 07/30/23 0400  BP:  137/87 (!) 128/90 125/76  Pulse:   99 93 96  Resp:    19  Temp:      TempSrc:      SpO2:  99% 100% 96%  Weight: 109.3 kg     Height: 6\' 4"  (1.93 m)     PainSc: 0-No pain       Isolation Precautions No active isolations  Medications Medications  aspirin EC tablet 81 mg (has no administration in time range)  amLODipine (NORVASC) tablet 10 mg (has no administration in time range)  atorvastatin (LIPITOR) tablet 40 mg (has no administration in time range)  losartan (COZAAR) tablet 100 mg (has no administration in time range)  metoprolol succinate (TOPROL-XL) 24 hr tablet 50 mg (has no administration in time range)  DULoxetine (CYMBALTA) DR capsule 60 mg (has no administration in time range)  cycloSPORINE (RESTASIS) 0.05 % ophthalmic emulsion 1 drop (has no administration in time range)  insulin aspart (novoLOG) injection 0-6 Units (has no administration in time range)  insulin glargine-yfgn (SEMGLEE) injection 40 Units (has no administration in time range)  insulin aspart (novoLOG) injection 0-5 Units (has no administration in time range)  heparin injection 5,000 Units (has no administration in time range)  sodium chloride flush (NS) 0.9 % injection 3 mL (has no administration in time range)  acetaminophen (TYLENOL) tablet 650 mg (has no administration in time range)    Or  acetaminophen (TYLENOL) suppository 650 mg (has no administration in time range)  senna-docusate (Senokot-S) tablet 1 tablet (has no administration in time range)  ondansetron (ZOFRAN) tablet 4 mg (has no administration in time range)    Or  ondansetron (ZOFRAN) injection 4 mg (has no administration in time range)  sodium chloride flush (NS)  0.9 % injection 3 mL (3 mLs Intravenous Given 07/30/23 0144)  iohexol (OMNIPAQUE) 350 MG/ML injection 75 mL (75 mLs Intravenous Contrast Given 07/30/23 0124)    Mobility walks     Focused Assessments Cardiac Assessment Handoff:  Cardiac Rhythm: Normal sinus rhythm Lab Results  Component Value Date    CKTOTAL 115 04/22/2021   TROPONINI <0.03 04/09/2017   Lab Results  Component Value Date   DDIMER <0.27 01/29/2021   Does the Patient currently have chest pain? No   , Neuro Assessment Handoff:  Swallow screen pass? Yes  Cardiac Rhythm: Normal sinus rhythm NIH Stroke Scale  Dizziness Present: No Headache Present: No Interval: Initial Level of Consciousness (1a.)   : Alert, keenly responsive LOC Questions (1b. )   : Answers both questions correctly LOC Commands (1c. )   : Performs both tasks correctly Best Gaze (2. )  : Normal Visual (3. )  : No visual loss Facial Palsy (4. )    : Normal symmetrical movements Motor Arm, Left (5a. )   : No drift Motor Arm, Right (5b. ) : No drift Motor Leg, Left (6a. )  : No drift Motor Leg, Right (6b. ) : No drift Limb Ataxia (7. ): Present in one limb Sensory (8. )  : Normal, no sensory loss Best Language (9. )  : No aphasia Dysarthria (10. ): Mild-to-moderate dysarthria, patient slurs at least some words and, at worst, can be understood with some difficulty Extinction/Inattention (11.)   : No Abnormality Complete NIHSS TOTAL: 2 Last date known well: 07/29/23 Last time known well: 2330 Neuro Assessment: Exceptions to [facility] Neuro Checks:   Initial (07/30/23 0143)  Has TPA been given? No If patient is a Neuro Trauma and patient is going to OR before floor call report to 4N Charge nurse: [phone] or [phone]   R Recommendations: See Admitting Provider Note  Report given to:   Additional Notes: PT A&Ox4. Pt has deep brain stimulator. MRI cannot be done until wife brings remote and chargers, which she will do later today per pt.

## 2023-07-30 NOTE — H&P (Signed)
History and Physical    Joshua [name] WJX:[id number] DOB: 03/17/1951 DOA: 07/30/2023  PCP: Creola Corn, MD   Patient coming from: Home   Chief Complaint: Slurred speech, difficulty getting words out   HPI: Joshua [name] is a pleasant 72 y.o. male with medical history significant for hypertension, type 2 diabetes mellitus, history of PE on Eliquis, OSA on CPAP, CKD 3B, history of CVA, and essential tremor with deep brain stimulator who presents with acute onset speech difficulty.  Patient reports that he had had an uneventful evening, was in bed, and had just turned on his CPAP machine when he had a vague sensation that something was wrong.  While trying to relate this to his wife, he was noted to have slurred speech.  He has also been experiencing difficulty getting the words out.  He denies any new focal numbness or weakness.  He reports difficulty with balance that he attributes to his prior stroke.  He denies chest pain, shortness of breath, leg swelling, fever, or chills.  ED Course: Upon arrival to the ED, patient is found to be afebrile and saturating well on nasal cannula with normal respiratory rate, slightly elevated heart rate, and stable blood pressure.  Labs are most notable for creatinine 2.15.  Head CT is negative for acute findings.  No LVO noted on CTA head and neck.  Neurology assessed the patient in the emergency department and recommended further evaluation including MRI brain.  Review of Systems:  All other systems reviewed and apart from HPI, are negative.  Past Medical History:  Diagnosis Date   Benign essential tremor    Benign positional vertigo    Chronic kidney disease 08/28/2019   CVA (cerebral vascular accident) (HCC)    x2 - L retina, 1 right parietal   Degenerative arthritis    Depression    Diabetes mellitus    DVT (deep venous thrombosis) (HCC) 2018   Dyslipidemia    GERD (gastroesophageal reflux disease)    hiatal hernia   Gout    H/O: vasectomy     Hearing aid worn    b/l   Hx of appendectomy    Hx of tonsillectomy    Hypertension    Hypertrophic cardiomyopathy (HCC)    Ischemic optic neuropathy    on the left   Melanoma (HCC)    NSVT (nonsustained ventricular tachycardia) (HCC)    1 4 beat run on event monitor in 07/2020   Obesity    OSA on CPAP    setting = 5   Pulmonary emboli (HCC) 2018   PVC's (premature ventricular contractions)    SVT (supraventricular tachycardia)    by event monitor   Tremor, essential 06/22/2017   Wears glasses     Past Surgical History:  Procedure Laterality Date   APPENDECTOMY     arthroscopic knee surgery Bilateral    CATARACT EXTRACTION Bilateral    COLONOSCOPY     MINOR PLACEMENT OF FIDUCIAL N/A 06/30/2019   Procedure: Fiducial placement;  Surgeon: Maeola Harman, MD;  Location: [facility] OR;  Service: Neurosurgery;  Laterality: N/A;  Fiducial placement   NASAL SEPTUM SURGERY     PULSE GENERATOR IMPLANT N/A 07/14/2019   Procedure: Left cranial Implanted Pulse Generator and lead extension placement to right chest ;  Surgeon: Maeola Harman, MD;  Location: [facility] OR;  Service: Neurosurgery;  Laterality: N/A;   SUBTHALAMIC STIMULATOR INSERTION Left 07/07/2019   Procedure: LEFT DEEP BRAIN STIMULATOR PLACEMENT;  Surgeon: Maeola Harman, MD;  Location:  [facility] OR;  Service: Neurosurgery;  Laterality: Left;   TEE WITHOUT CARDIOVERSION N/A 04/23/2021   Procedure: TRANSESOPHAGEAL ECHOCARDIOGRAM (TEE);  Surgeon: Elease Hashimoto Deloris Ping, MD;  Location: [facility] ENDOSCOPY;  Service: Cardiovascular;  Laterality: N/A;   TONSILLECTOMY     TOTAL KNEE ARTHROPLASTY Left 03/30/2017   Procedure: LEFT TOTAL KNEE ARTHROPLASTY;  Surgeon: Durene Romans, MD;  Location: [facility] ORS;  Service: Orthopedics;  Laterality: Left;   WISDOM TOOTH EXTRACTION      Social History:   reports that he quit smoking about 39 years ago. His smoking use included cigarettes. He started smoking about 49 years ago. He has a 10 pack-year smoking history. He has never used  smokeless tobacco. He reports current alcohol use of about 2.0 standard drinks of alcohol per week. He reports that he does not use drugs.  Allergies  Allergen Reactions   Melatonin Other (See Comments)    dizzy    Family History  Problem Relation Age of Onset   Cerebral aneurysm Mother    Tremor Mother    Alzheimer's disease Father    Tremor Brother    Tremor Maternal Uncle    Diabetes Maternal Grandmother    Healthy Son    Colon cancer Neg Hx      Prior to Admission medications   Medication Sig Start Date End Date Taking? Authorizing Provider  amLODipine (NORVASC) 10 MG tablet TAKE ONE TABLET AT BEDTIME. 04/02/23  Yes Little Ishikawa, MD  apixaban (ELIQUIS) 5 MG TABS tablet Take 1 tablet (5 mg total) by mouth 2 (two) times daily. 02/04/23  Yes Little Ishikawa, MD  aspirin 81 MG EC tablet 81 mg daily. 04/23/21  Yes [provider]  atorvastatin (LIPITOR) 80 MG tablet Take 1 tablet (80 mg total) by mouth daily at 6 PM. Patient taking differently: Take 40 mg by mouth daily at 6 PM. 08/31/19  Yes Danford, Earl Lites, MD  cycloSPORINE (RESTASIS) 0.05 % ophthalmic emulsion Apply 1 drop to eye 2 (two) times daily. 01/29/21  Yes [provider]  DULoxetine (CYMBALTA) 60 MG capsule Take 60 mg by mouth 2 (two) times daily.   Yes [provider]  ezetimibe (ZETIA) 10 MG tablet TAKE ONE TABLET BY MOUTH ONCE DAILY. 03/25/23  Yes Little Ishikawa, MD  fenofibrate 54 MG tablet Take 54 mg by mouth daily.   Yes [provider]  furosemide (LASIX) 40 MG tablet Take 1 tablet (40 mg total) by mouth as needed (Weight increase of 3 pounds in 1 day or 5 pounds in 1 week.). 03/26/21  Yes Little Ishikawa, MD  Insulin Glargine, 1 Unit Dial, (TOUJEO SOLOSTAR) 300 UNIT/ML SOPN Inject 70 Units into the skin 2 (two) times daily.   Yes [provider]  losartan (COZAAR) 100 MG tablet Take 1 tablet by mouth daily. 03/25/20  Yes [provider]  metFORMIN (GLUCOPHAGE) 500 MG tablet Take 500 mg by mouth 2 (two) times daily with a meal. 03/25/20  Yes [provider]  Multiple Vitamin (MULTIVITAMIN WITH MINERALS) TABS tablet Take 1 tablet by mouth daily.   Yes [provider]  Polyethyl Glycol-Propyl Glycol (SYSTANE OP) Place 1-2 drops into both eyes 4 (four) times daily as needed (dry eyes).    Yes [provider]  PRESCRIPTION MEDICATION Inhale into the lungs at bedtime. CPAP   Yes [provider]  colchicine 0.6 MG tablet colchicine 0.6 mg tablet  TAKE ONE TABLET BY MOUTH TWICE DAILY. Patient not taking: Reported  on 07/30/2023    [provider]  Continuous Blood Gluc Sensor (FREESTYLE LIBRE 2 SENSOR) MISC See admin instructions.    [provider]  metoprolol succinate (TOPROL-XL) 50 MG 24 hr tablet Take 1 tablet (50 mg total) by mouth daily. With or immediately following a meal. 04/02/23   Little Ishikawa, MD  mirtazapine (REMERON) 7.5 MG tablet Take 7.5 mg by mouth at bedtime.    [provider]  [facility] VERIO test strip  09/13/18   [provider]  SURE COMFORT PEN NEEDLES 31G X 5 MM MISC  09/12/19   [provider]    Physical Exam: Vitals:   07/30/23 0130 07/30/23 0138 07/30/23 0145 07/30/23 0200  BP: 129/82  137/87 (!) 128/90  Pulse: (!) 102  99 93  Resp: 20     Temp:      TempSrc:      SpO2: 93%  99% 100%  Weight:  109.3 kg    Height:  6\' 4"  (1.93 m)      Constitutional: NAD, calm  Eyes: PERTLA, lids and conjunctivae normal ENMT: Mucous membranes are moist. Posterior pharynx clear of any exudate or lesions.   Neck: supple, no masses  Respiratory: no wheezing, no crackles. No accessory muscle use.  Cardiovascular: S1 & S2 heard, regular rate and rhythm. No JVD. Abdomen: No distension, no tenderness, soft. Bowel sounds active.  Musculoskeletal: no clubbing / cyanosis. No joint deformity upper and lower extremities.    Skin: no significant rashes, lesions, ulcers. Warm, dry, well-perfused. Neurologic: Dysarthria noted, CN 2-12 grossly intact otherwise. Sensation to light touch intact. Strength 5/5 in all 4 limbs. Alert and oriented.  Psychiatric: Pleasant. Cooperative.    Labs and Imaging on Admission: I have personally reviewed following labs and imaging studies  CBC: Recent Labs  Lab 07/30/23 0113 07/30/23 0114  WBC 9.2  --   NEUTROABS 3.7  --   HGB 11.3* 11.2*  HCT 34.9* 33.0*  MCV 92.1  --   PLT 216  --    Basic Metabolic Panel: Recent Labs  Lab 07/30/23 0113 07/30/23 0114  NA 140 142  K 4.7 4.7  CL 104 106  CO2 24  --   GLUCOSE 152* 147*  BUN 21 25*  CREATININE 2.15* 2.30*  CALCIUM 8.5*  --    GFR: Estimated Creatinine Clearance: 38.2 mL/min (A) (by C-G formula based on SCr of 2.3 mg/dL (H)). Liver Function Tests: Recent Labs  Lab 07/30/23 0113  AST 29  ALT 22  ALKPHOS 67  BILITOT 0.4  PROT 5.7*  ALBUMIN 3.1*   No results for input(s): "LIPASE", "AMYLASE" in the last 168 hours. No results for input(s): "AMMONIA" in the last 168 hours. Coagulation Profile: Recent Labs  Lab 07/30/23 0113  INR 1.1   Cardiac Enzymes: No results for input(s): "CKTOTAL", "CKMB", "CKMBINDEX", "TROPONINI" in the last 168 hours. BNP (last 3 results) No results for input(s): "PROBNP" in the last 8760 hours. HbA1C: No results for input(s): "HGBA1C" in the last 72 hours. CBG: Recent Labs  Lab 07/30/23 0109  GLUCAP 142*   Lipid Profile: No results for input(s): "CHOL", "HDL", "LDLCALC", "TRIG", "CHOLHDL", "LDLDIRECT" in the last 72 hours. Thyroid Function Tests: No results for input(s): "TSH", "T4TOTAL", "FREET4", "T3FREE", "THYROIDAB" in the last 72 hours. Anemia Panel: No results for input(s): "VITAMINB12", "FOLATE", "FERRITIN", "TIBC", "IRON", "RETICCTPCT" in the last 72 hours. Urine analysis:    Component Value Date/Time   COLORURINE YELLOW 04/21/2021 1610   APPEARANCEUR  HAZY (A) 04/21/2021 1610   LABSPEC 1.018 04/21/2021 1610   PHURINE 6.0 04/21/2021 1610   GLUCOSEU 150 (A) 04/21/2021 1610   HGBUR MODERATE (A) 04/21/2021 1610   BILIRUBINUR NEGATIVE 04/21/2021 1610   KETONESUR NEGATIVE 04/21/2021 1610   PROTEINUR 100 (A) 04/21/2021 1610   UROBILINOGEN 0.2 04/08/2011 0732   NITRITE NEGATIVE 04/21/2021 1610   LEUKOCYTESUR NEGATIVE 04/21/2021 1610   Sepsis Labs: @LABRCNTIP (procalcitonin:4,lacticidven:4) ) Recent Results (from the past 240 hour(s))  Resp panel by RT-PCR (RSV, Flu A&B, Covid) Anterior Nasal Swab     Status: None   Collection Time: 07/30/23  1:36 AM   Specimen: Anterior Nasal Swab  Result Value Ref Range Status   SARS Coronavirus 2 by RT PCR NEGATIVE NEGATIVE Final   Influenza A by PCR NEGATIVE NEGATIVE Final   Influenza B by PCR NEGATIVE NEGATIVE Final    Comment: (NOTE) The Xpert Xpress SARS-CoV-2/FLU/RSV plus assay is intended as an aid in the diagnosis of influenza from Nasopharyngeal swab specimens and should not be used as a sole basis for treatment. Nasal washings and aspirates are unacceptable for Xpert Xpress SARS-CoV-2/FLU/RSV testing.  Fact Sheet for Patients: BloggerCourse.com  Fact Sheet for Healthcare Providers: SeriousBroker.it  This test is not yet approved or cleared by the [place] FDA and has been authorized for detection and/or diagnosis of SARS-CoV-2 by FDA under an Emergency Use Authorization (EUA). This EUA will remain in effect (meaning this test can be used) for the duration of the COVID-19 declaration under Section 564(b)(1) of the Act, 21 U.S.C. section 360bbb-3(b)(1), unless the authorization is terminated or revoked.     Resp Syncytial Virus by PCR NEGATIVE NEGATIVE Final    Comment: (NOTE) Fact Sheet for Patients: BloggerCourse.com  Fact Sheet for Healthcare  Providers: SeriousBroker.it  This test is not yet approved or cleared by the [place] FDA and has been authorized for detection and/or diagnosis of SARS-CoV-2 by FDA under an Emergency Use Authorization (EUA). This EUA will remain in effect (meaning this test can be used) for the duration of the COVID-19 declaration under Section 564(b)(1) of the Act, 21 U.S.C. section 360bbb-3(b)(1), unless the authorization is terminated or revoked.  Performed at [facility], [address] [place], [place] [address]      Radiological Exams on Admission: DG Chest Portable 1 View  Result Date: 07/30/2023 CLINICAL DATA:  Hypoxia EXAM: PORTABLE CHEST 1 VIEW COMPARISON:  07/30/2022 FINDINGS: Heart and mediastinal contours within normal limits. Low lung volumes, bibasilar atelectasis. No effusions or pneumothorax. No acute bony abnormality. Right upper chest wall stimulator battery pack again noted, unchanged. IMPRESSION: Low lung volumes, bibasilar atelectasis. Electronically Signed   By: Charlett Nose M.D.   On: 07/30/2023 03:43   CT HEAD CODE STROKE WO CONTRAST  Result Date: 07/30/2023 CLINICAL DATA:  Initial evaluation for neuro deficit, stroke, facial droop. EXAM: CT ANGIOGRAPHY HEAD AND NECK TECHNIQUE: Multidetector CT imaging of the head and neck was performed using the standard protocol during bolus administration of intravenous contrast. Multiplanar CT image reconstructions and MIPs were obtained to evaluate the vascular anatomy. Carotid stenosis measurements (when applicable) are obtained utilizing NASCET criteria, using the distal internal carotid diameter as the denominator. RADIATION DOSE REDUCTION: This exam was performed according to the departmental dose-optimization program which includes automated exposure control, adjustment of the mA and/or kV according to patient size and/or use of iterative reconstruction technique. CONTRAST:  75mL OMNIPAQUE IOHEXOL 350  MG/ML SOLN COMPARISON:  Prior study from 04/22/2021. FINDINGS:  CT HEAD FINDINGS Brain: Atrophy with moderate chronic microvascular ischemic disease. Remote lacunar infarct present at the right basal ganglia. Left-sided DBS electrode in place. No acute intracranial hemorrhage. No acute large vessel territory infarct. No mass lesion or midline shift. No hydrocephalus or extra-axial fluid collection. Vascular: No abnormal hyperdense vessel. Skull: No acute scalp soft tissue abnormality.  Calvarium intact. Sinuses/Orbits: Globes and orbital soft tissues demonstrate no acute finding. Retained metallic density present anterior to the left lobe (series 4, image 19). Paranasal sinuses are clear. No mastoid effusion. Other: None. ASPECTS (Alberta Stroke Program Early CT Score) - Ganglionic level infarction (caudate, lentiform nuclei, internal capsule, insula, M1-M3 cortex): 7 - Supraganglionic infarction (M4-M6 cortex): 3 Total score (0-10 with 10 being normal): 10 - CTA NECK FINDINGS Aortic arch: Visualized aortic arch within normal limits for caliber. Bovine branching pattern noted. Scattered plaque about the arch and origin of the great vessels without hemodynamically significant stenosis. Specific note is made of some soft plaque/thrombus protruding into the lumen of the proximal left subclavian artery (series 7, image 341). Right carotid system: Right common and internal carotid arteries are tortuous without dissection. Mild atheromatous change about the right carotid bulb without hemodynamically significant stenosis. Left carotid system: Left common and internal carotid arteries are tortuous but patent without dissection. Moderate atheromatous change about the left carotid bulb without hemodynamically significant stenosis. Vertebral arteries: Both vertebral arteries arise from subclavian arteries. Vertebral arteries are mildly tortuous but patent without stenosis or dissection. Skeleton: No discrete or worrisome  osseous lesions. Other neck: No acute finding. Upper chest: No acute finding.  Right-sided pacemaker/AICD in place. Review of the MIP images confirms the above findings CTA HEAD FINDINGS Anterior circulation: Moderate atheromatous change about the carotid siphons without hemodynamically significant stenosis. A1 segments patent bilaterally. Right A1 hypoplastic. Normal anterior communicating artery complex. Anterior cerebral arteries patent without stenosis. Normal in stenosis or occlusion. No proximal MCA branch occlusion or high-grade stenosis. Distal MCA branches perfused and symmetric. Posterior circulation: Both V4 segments patent without stenosis. Right PICA patent. Left PICA not seen. Basilar widely patent. Superior cerebellar and posterior cerebral arteries patent bilaterally. Venous sinuses: Patent allowing for timing the contrast bolus. Anatomic variants: As above. Intracranial circulation is somewhat diffusely dolichoectatic in appearance. No aneurysm. Review of the MIP images confirms the above findings IMPRESSION: CT HEAD: 1. No acute intracranial abnormality. 2. Aspects is 10. 3. Atrophy with moderate chronic small vessel ischemic disease, with remote right basal ganglia lacunar infarct. CTA HEAD AND NECK: 1. Negative CTA for large vessel occlusion or other emergent finding. 2. Moderate atheromatous change about the carotid bifurcations and carotid siphons without hemodynamically significant stenosis. 3. Diffuse tortuosity of the major arterial vasculature of the head and neck, suggesting chronic underlying hypertension. These results were communicated to Dr. Amada Jupiter at 1:36 am on 07/30/2023 by text page via the [facility] messaging system. Electronically Signed   By: Rise Mu M.D.   On: 07/30/2023 01:51   CT ANGIO HEAD NECK W WO CM (CODE STROKE)  Result Date: 07/30/2023 CLINICAL DATA:  Initial evaluation for neuro deficit, stroke, facial droop. EXAM: CT ANGIOGRAPHY HEAD AND NECK TECHNIQUE:  Multidetector CT imaging of the head and neck was performed using the standard protocol during bolus administration of intravenous contrast. Multiplanar CT image reconstructions and MIPs were obtained to evaluate the vascular anatomy. Carotid stenosis measurements (when applicable) are obtained utilizing NASCET criteria, using the distal internal carotid diameter as the denominator. RADIATION DOSE REDUCTION: This exam was performed  according to the departmental dose-optimization program which includes automated exposure control, adjustment of the mA and/or kV according to patient size and/or use of iterative reconstruction technique. CONTRAST:  75mL OMNIPAQUE IOHEXOL 350 MG/ML SOLN COMPARISON:  Prior study from 04/22/2021. FINDINGS: CT HEAD FINDINGS Brain: Atrophy with moderate chronic microvascular ischemic disease. Remote lacunar infarct present at the right basal ganglia. Left-sided DBS electrode in place. No acute intracranial hemorrhage. No acute large vessel territory infarct. No mass lesion or midline shift. No hydrocephalus or extra-axial fluid collection. Vascular: No abnormal hyperdense vessel. Skull: No acute scalp soft tissue abnormality.  Calvarium intact. Sinuses/Orbits: Globes and orbital soft tissues demonstrate no acute finding. Retained metallic density present anterior to the left lobe (series 4, image 19). Paranasal sinuses are clear. No mastoid effusion. Other: None. ASPECTS (Alberta Stroke Program Early CT Score) - Ganglionic level infarction (caudate, lentiform nuclei, internal capsule, insula, M1-M3 cortex): 7 - Supraganglionic infarction (M4-M6 cortex): 3 Total score (0-10 with 10 being normal): 10 - CTA NECK FINDINGS Aortic arch: Visualized aortic arch within normal limits for caliber. Bovine branching pattern noted. Scattered plaque about the arch and origin of the great vessels without hemodynamically significant stenosis. Specific note is made of some soft plaque/thrombus protruding into  the lumen of the proximal left subclavian artery (series 7, image 341). Right carotid system: Right common and internal carotid arteries are tortuous without dissection. Mild atheromatous change about the right carotid bulb without hemodynamically significant stenosis. Left carotid system: Left common and internal carotid arteries are tortuous but patent without dissection. Moderate atheromatous change about the left carotid bulb without hemodynamically significant stenosis. Vertebral arteries: Both vertebral arteries arise from subclavian arteries. Vertebral arteries are mildly tortuous but patent without stenosis or dissection. Skeleton: No discrete or worrisome osseous lesions. Other neck: No acute finding. Upper chest: No acute finding.  Right-sided pacemaker/AICD in place. Review of the MIP images confirms the above findings CTA HEAD FINDINGS Anterior circulation: Moderate atheromatous change about the carotid siphons without hemodynamically significant stenosis. A1 segments patent bilaterally. Right A1 hypoplastic. Normal anterior communicating artery complex. Anterior cerebral arteries patent without stenosis. Normal in stenosis or occlusion. No proximal MCA branch occlusion or high-grade stenosis. Distal MCA branches perfused and symmetric. Posterior circulation: Both V4 segments patent without stenosis. Right PICA patent. Left PICA not seen. Basilar widely patent. Superior cerebellar and posterior cerebral arteries patent bilaterally. Venous sinuses: Patent allowing for timing the contrast bolus. Anatomic variants: As above. Intracranial circulation is somewhat diffusely dolichoectatic in appearance. No aneurysm. Review of the MIP images confirms the above findings IMPRESSION: CT HEAD: 1. No acute intracranial abnormality. 2. Aspects is 10. 3. Atrophy with moderate chronic small vessel ischemic disease, with remote right basal ganglia lacunar infarct. CTA HEAD AND NECK: 1. Negative CTA for large vessel  occlusion or other emergent finding. 2. Moderate atheromatous change about the carotid bifurcations and carotid siphons without hemodynamically significant stenosis. 3. Diffuse tortuosity of the major arterial vasculature of the head and neck, suggesting chronic underlying hypertension. These results were communicated to Dr. Amada Jupiter at 1:36 am on 07/30/2023 by text page via the [facility] messaging system. Electronically Signed   By: Rise Mu M.D.   On: 07/30/2023 01:51    EKG: Independently reviewed. Sinus tachycardia, rate 102, 1st degree AV block, LBBB.   Assessment/Plan   1. Speech difficulty  - No acute findings on head CT   - No evidence for infection or other cause for recrudescence identified  - Check MRI brain  2. Hx of VTE  - Hold Elqiuis pending MRI brain results    3. Insulin-dependent type II DM  - A1c was 7.9% in 2022  - Check CBGs, treat with insulin    4. Hypertension  - Continue Norvasc, losartan, metoprolol   5. Hx of CVA  - Continue ASA and Lipitor   6. Chronic diastolic CHF  - Appears compensated   - Monitor weight and I/Os   7. OSA  - CPAP at bedtime    DVT prophylaxis: sq heparin  Code Status: Full  Level of Care: Level of care: Telemetry Medical Family Communication: None present  Disposition Plan:  Patient is from: Home  Anticipated d/c is to: TBD Anticipated d/c date is: Possibly as early as 07/31/23  Patient currently: Pending MRI brain, improved oxygenation  Consults called: Neurology  Admission status: Observation     Briscoe Deutscher, MD [facility]  07/30/2023, 4:43 AM

## 2023-07-30 NOTE — ED Triage Notes (Signed)
Pt BIB [facility] from home. Pt having sudden onset of slurred speech and facial droop. LSN 2330.   Pt has hx of stroke and multiple brain surgeries.

## 2023-07-31 ENCOUNTER — Observation Stay (HOSPITAL_COMMUNITY): Payer: Medicare PPO

## 2023-07-31 ENCOUNTER — Other Ambulatory Visit (HOSPITAL_COMMUNITY): Payer: Self-pay

## 2023-07-31 DIAGNOSIS — I639 Cerebral infarction, unspecified: Secondary | ICD-10-CM | POA: Diagnosis not present

## 2023-07-31 DIAGNOSIS — Z91199 Patient's noncompliance with other medical treatment and regimen due to unspecified reason: Secondary | ICD-10-CM

## 2023-07-31 DIAGNOSIS — G319 Degenerative disease of nervous system, unspecified: Secondary | ICD-10-CM | POA: Diagnosis not present

## 2023-07-31 DIAGNOSIS — I6782 Cerebral ischemia: Secondary | ICD-10-CM | POA: Diagnosis not present

## 2023-07-31 DIAGNOSIS — R29818 Other symptoms and signs involving the nervous system: Secondary | ICD-10-CM | POA: Diagnosis not present

## 2023-07-31 DIAGNOSIS — R471 Dysarthria and anarthria: Secondary | ICD-10-CM | POA: Diagnosis not present

## 2023-07-31 LAB — CBC
HCT: 37.8 % — ABNORMAL LOW (ref 39.0–52.0)
Hemoglobin: 12.3 g/dL — ABNORMAL LOW (ref 13.0–17.0)
MCH: 30.3 pg (ref 26.0–34.0)
MCHC: 32.5 g/dL (ref 30.0–36.0)
MCV: 93.1 fL (ref 80.0–100.0)
Platelets: 212 10*3/uL (ref 150–400)
RBC: 4.06 MIL/uL — ABNORMAL LOW (ref 4.22–5.81)
RDW: 14.4 % (ref 11.5–15.5)
WBC: 6.7 10*3/uL (ref 4.0–10.5)
nRBC: 0 % (ref 0.0–0.2)

## 2023-07-31 LAB — GLUCOSE, CAPILLARY
Glucose-Capillary: 64 mg/dL — ABNORMAL LOW (ref 70–99)
Glucose-Capillary: 66 mg/dL — ABNORMAL LOW (ref 70–99)
Glucose-Capillary: 88 mg/dL (ref 70–99)
Glucose-Capillary: 115 mg/dL — ABNORMAL HIGH (ref 70–99)
Glucose-Capillary: 143 mg/dL — ABNORMAL HIGH (ref 70–99)

## 2023-07-31 LAB — BASIC METABOLIC PANEL
Anion gap: 9 (ref 5–15)
BUN: 19 mg/dL (ref 8–23)
CO2: 26 mmol/L (ref 22–32)
Calcium: 9.4 mg/dL (ref 8.9–10.3)
Chloride: 106 mmol/L (ref 98–111)
Creatinine, Ser: 1.88 mg/dL — ABNORMAL HIGH (ref 0.61–1.24)
GFR, Estimated: 37 mL/min — ABNORMAL LOW (ref >60)
Glucose, Bld: 66 mg/dL — ABNORMAL LOW (ref 70–99)
Potassium: 4.4 mmol/L (ref 3.5–5.1)
Sodium: 141 mmol/L (ref 135–145)

## 2023-07-31 LAB — LIPID PANEL
Cholesterol: 141 mg/dL (ref 0–200)
HDL: 35 mg/dL — ABNORMAL LOW (ref >40)
LDL Cholesterol: 70 mg/dL (ref 0–99)
Total CHOL/HDL Ratio: 4 ratio
Triglycerides: 178 mg/dL — ABNORMAL HIGH (ref <150)
VLDL: 36 mg/dL (ref 0–40)

## 2023-07-31 MED ORDER — INSULIN GLARGINE-YFGN 100 UNIT/ML ~~LOC~~ SOLN
40.0000 [IU] | Freq: Every day | SUBCUTANEOUS | Status: DC
  Filled 2023-07-31: qty 0.4

## 2023-07-31 MED ORDER — CLOPIDOGREL BISULFATE 75 MG PO TABS
75.0000 mg | ORAL_TABLET | Freq: Every day | ORAL | 0 refills | Status: DC
Start: 2023-07-31 — End: 2023-08-18
  Filled 2023-07-31: qty 90, 90d supply, fill #0

## 2023-07-31 NOTE — Progress Notes (Addendum)
STROKE TEAM PROGRESS NOTE   BRIEF HPI [name] is a 72 y.o. male with history of T2DM, PE on Eliquis, OSA, CKD IIIB, history of CVA and essential tremor well-controlled with DBS, who presents with acute-onset aphasia and confusion. Denied further symptoms. CT Head negative. MRI showed new chronic right lacunar infarct.   SIGNIFICANT HOSPITAL EVENTS 9/5: presentation, CT head negative, A1c 6.6 9/6: MRI: no acute intracranial abnormality. New chronic right lacunar infarct from MRI 2022.   INTERIM HISTORY/SUBJECTIVE  On interview with wife in room, patient is in good spirits. Alert and oriented to interview. Understands plan is to stop Eliquis and start on DAPT for 3 months. Physical exam largely non-focal. Baseline intention tremor is improved today from yesterday. Cleared by PT/OT for home with wife. No new concerns at this time.  OBJECTIVE  CBC    Component Value Date/Time   WBC 6.7 07/31/2023 0612   RBC 4.06 (L) 07/31/2023 0612   HGB 12.3 (L) 07/31/2023 0612   HGB 12.2 (L) 06/25/2023 1000   HCT 37.8 (L) 07/31/2023 0612   HCT 37.8 06/25/2023 1000   PLT 212 07/31/2023 0612   PLT 278 06/25/2023 1000   MCV 93.1 07/31/2023 0612   MCV 90 06/25/2023 1000   MCH 30.3 07/31/2023 0612   MCHC 32.5 07/31/2023 0612   RDW 14.4 07/31/2023 0612   RDW 14.2 06/25/2023 1000   LYMPHSABS 4.4 (H) 07/30/2023 0113   MONOABS 0.6 07/30/2023 0113   EOSABS 0.4 07/30/2023 0113   BASOSABS 0.1 07/30/2023 0113    BMET    Component Value Date/Time   NA 141 07/31/2023 0612   NA 141 06/25/2023 1000   K 4.4 07/31/2023 0612   CL 106 07/31/2023 0612   CO2 26 07/31/2023 0612   GLUCOSE 66 (L) 07/31/2023 0612   BUN 19 07/31/2023 0612   BUN 28 (H) 06/25/2023 1000   CREATININE 1.88 (H) 07/31/2023 0612   CALCIUM 9.4 07/31/2023 0612   EGFR 34 (L) 06/25/2023 1000   GFRNONAA 37 (L) 07/31/2023 0612    IMAGING past 24 hours MR BRAIN WO CONTRAST  Result Date: 07/31/2023 CLINICAL DATA:  Provided  history: Neuro deficit, acute, stroke suspected. EXAM: MRI HEAD WITHOUT CONTRAST TECHNIQUE: Multiplanar, multiecho pulse sequences of the brain and surrounding structures were obtained without intravenous contrast. COMPARISON:  Non-contrast head CT and CT angiogram head/neck 07/30/2023. Brain MRI 04/24/2021. FINDINGS: An abbreviated examination was performed to comply with the patient's deep brain stimulator lead scanning conditions. The following sequences were acquired: axial diffusion-weighted, coronal diffusion-weighted, axial T1-weighted, axial T2 FLAIR, axial T2 and axial SWI. Routine sagittal T1-weighted and coronal T2 sequences were not acquired. Within this limitation, findings are as follows. Brain: Mild generalized cerebral atrophy. Redemonstrated left-sided deep brain stimulator lead terminating in the region of the left subthalamic nucleus. Small chronic lacunar infarct within the right centrum semiovale, new from the prior brain MRI of 04/24/2021 (series 11, image 18). Redemonstrated chronic lacunar infarcts within the right corona radiata and bilateral deep gray nuclei. Background mild-to-moderate multifocal T2 FLAIR hyperintense signal abnormality within the cerebral white matter, nonspecific but compatible with chronic small vessel ischemic disease. Minimal chronic small vessel ischemic changes within the pons. Chronic microhemorrhage within the right aspect of the pons, new from the prior brain MRI. There is no acute infarct. No evidence of an intracranial mass. No extra-axial fluid collection. No midline shift. Vascular: Maintained flow voids within the proximal large arterial vessels. Skull and upper cervical spine: Bilateral frontoparietal  burr holes. No focal suspicious marrow lesion. Sinuses/Orbits: Susceptibility artifact partially obscures the left orbit. No appreciable acute orbital abnormality. Prior right ocular lens replacement. No significant paranasal sinus disease. IMPRESSION: 1.  Abbreviated examination performed to comply with the patient's deep brain stimulator lead scanning conditions. 2.  No evidence of an acute intracranial abnormality. 3. Cerebral atrophy, chronic small vessel ischemic disease and chronic lacunar infarcts as described. Of note, a chronic lacunar infarct within the right centrum semiovale is new from the prior brain MRI of 04/24/2021. A chronic microhemorrhage within the right aspect is also new from the prior MRI. Electronically Signed   By: Jackey Loge D.O.   On: 07/31/2023 15:03    Vitals:   07/31/23 0315 07/31/23 0755 07/31/23 1130 07/31/23 1519  BP: (!) 116/52 (!) 146/77 124/76 130/86  Pulse: 64 68 66 72  Resp: 18 18 18 17   Temp: 97.7 F (36.5 C) 98 F (36.7 C) 97.9 F (36.6 C) 98.3 F (36.8 C)  TempSrc: Oral Oral Oral Oral  SpO2: 99% 98% 100% 99%  Weight:      Height:         PHYSICAL EXAM General:  Alert, well-nourished, well-developed patient in no acute distress Psych:  Mood and affect appropriate for situation CV: Regular rate and rhythm on monitor Respiratory:  Regular, unlabored respirations on room air GI: Abdomen soft and nontender   NEURO:  Mental Status: AA&Ox3, patient is able to give clear and coherent history Speech/Language: speech has mild dysarthria.  Naming, repetition, fluency, and comprehension intact.  Cranial Nerves:  II: Visual fields full.  III, IV, VI: EOMI. Eyelids elevate symmetrically.  V: Sensation is intact to light touch and symmetrical to face.  VII: Face symmetrical VIII: Hearing intact to voice. IX, X: Phonation is normal.  ZO:[id number] shrug 5/5. XII: tongue is midline without fasciculations. Motor: RLE 3/5 strength, otherwise 5/5 strength to all muscle groups tested, coarse action tremor of R hand which worsens with intention, improved from yesterday Tone: is normal and bulk is normal Sensation: Intact to light touch bilaterally. Extinction absent to light touch to DSS.   Coordination:  FTN intact, limited but improved from yesterday, mild  Gait: deferred   ASSESSMENT/PLAN  Rapid-onset aphasia and confusion ISO stroke history, resolving Etiology: Acute infarct  MRI Brain 9/6 showed no acute infarct, but new chronic R lacunar infarct from MRI in 2022. Believe this is the source of the patient's presenting symptoms. Will continue with DAPT for 3 months and continue aspirin indefinitely. Discontinue home Eliquis on discharge ISO reduction of previous left subclavian clot now "soft plaque."   Code Stroke CT head:  No acute abnormality. Small vessel disease. Atrophy with moderate chronic small vessel ischemic disease and remote right basal ganglia lacunar infarct. ASPECTS 10.    CTA head & neck: negative for large vessel occlusion. Moderate atheromatous change of carotid bifurcations wihtout significant stenosis.  EEG: negative MRI Brain wc: New from 2022: chronic lacunar infarct within right centrum semiovale as well as chronic microhemorrhage within right aspect. +Cerebral atrophy, +chronic small vessel ischemic disease.  LDL 53 HgbA1c 6.6 VTE prophylaxis - home aspirin and subcutaneous heparin, held home eliquis ISO upcoming MRI  Therapy recommendations:  No follow up needed Disposition:  Home with wife  Hx of Stroke/TIA Dates back to 2013 -- most recent 04/17/2021 with lasting foot drop and mild dysarthria at baseline, started on eliquis 5 BID at that time.   Hypertension Home meds:  Norvasc 10, losartan 100,  metoprolol tartrate 50 every day, all resumed in hospital Stable Blood Pressure Goal: SBP less than 160   Hyperlipidemia Home meds:  Atorvastatin 40 mg, ezitimibe 10, fenofibrate 54 mg, atorvastatin resumed in hospital LDL 53, goal < 70 Continue statin at discharge  Diabetes type II Controlled Home meds:  insulin glargine 70u BID,  HgbA1c 6.6, goal < 7.0 CBGs SSI Recommend close follow-up with PCP for better DM control  Tobacco Abuse Former, 10 pack  years   Other Stroke Risk Factors Family hx stroke  Coronary artery disease Advanced age  Hospital day # 0 I have personally obtained history,examined this patient, reviewed notes, independently viewed imaging studies, participated in medical decision making and plan of care.ROS completed by me personally and pertinent positives fully documented  I have made any additions or clarifications directly to the above note. Agree with note above.  The patient's neurological exam is returned back to baseline.  MR shows right corona radiata diffusion hyperintensity possibly T2 shine through from a old infarct.  Recommend discontinue Eliquis since there is no definite indication for long-term and the subclavian clot appears to have resolved.  Recommend dual antiplatelet therapy for 3 months instead followed by single agent.  Long discussion patient and wife and answered questions.  Discussed with Dr. Deno Etienne.  Stroke team will sign off.  Kindly call for questions.  Delia Heady, MD Medical Director [facility] Pager: [phone] 07/31/2023 4:30 PM

## 2023-07-31 NOTE — Discharge Summary (Signed)
Physician Discharge Summary   Patient: Joshua [name] MRN: [id number] DOB: 1951-11-06  Admit date:     07/30/2023  Discharge date: 07/31/23  Discharge Physician: Rickey Barbara   PCP: Creola Corn, MD   Recommendations at discharge:    Follow up with PCP in 1-2 weeks Follow up with Neurology as scheduled  Discharge Diagnoses: Principal Problem:   Dysarthria Active Problems:   Type 2 diabetes mellitus with left diabetic foot ulcer (HCC)   OSA on CPAP   Essential hypertension   History of pulmonary embolism   CKD stage 3b, GFR 30-44 ml/min (HCC)  Resolved Problems:   * No resolved hospital problems. *  Hospital Course: 72 y.o. male with medical history significant for hypertension, type 2 diabetes mellitus, history of PE on Eliquis, OSA on CPAP, CKD 3B, history of CVA, and essential tremor with deep brain stimulator who presents with acute onset speech difficulty.   Patient reports that he had had an uneventful evening, was in bed, and had just turned on his CPAP machine when he had a vague sensation that something was wrong.  While trying to relate this to his wife, he was noted to have slurred speech.  He has also been experiencing difficulty getting the words out.  He denies any new focal numbness or weakness.  He reports difficulty with balance that he attributes to his prior stroke.  He denies chest pain, shortness of breath, leg swelling, fever, or chills.  Assessment and Plan: 1. Speech difficulty  - No acute findings on head CT   - MRI reviewed. No new infarct but new chronic R lacunar infarct compared to MRI in 2022 -Discussed with Neurology. Recs for ASA with plavix x 3 mos then aspirin alone indefinitely -PT/OT had been following   2. Hx of VTE in 2018 noted - Hold Elqiuis. Per neurology, recs to stop anticoagulation and transition to ASA and plavix per above   3. Insulin-dependent type II DM  - A1c was 7.9% in 2022  - Check CBGs, cont SSI as needed   4.  Hypertension  - Continue Norvasc, losartan, metoprolol  -bp stable at present   5. Hx of CVA  - Continue ASA and Lipitor    6. Chronic diastolic CHF  - Appears compensated   - Monitor weight and I/Os    7. OSA  - CPAP at bedtime      Consultants: Neurology Procedures performed:   Disposition: Home Diet recommendation:  Cardiac and Carb modified diet DISCHARGE MEDICATION: Allergies as of 07/31/2023       Reactions   Melatonin Other (See Comments)   dizzy        Medication List     STOP taking these medications    apixaban 5 MG Tabs tablet Commonly known as: Eliquis       TAKE these medications    amLODipine 10 MG tablet Commonly known as: NORVASC TAKE ONE TABLET AT BEDTIME.   aspirin EC 81 MG tablet 81 mg daily.   atorvastatin 80 MG tablet Commonly known as: LIPITOR Take 1 tablet (80 mg total) by mouth daily at 6 PM. What changed: how much to take   clopidogrel 75 MG tablet Commonly known as: Plavix Take 1 tablet (75 mg total) by mouth daily.   colchicine 0.6 MG tablet colchicine 0.6 mg tablet  TAKE ONE TABLET BY MOUTH TWICE DAILY.   cycloSPORINE 0.05 % ophthalmic emulsion Commonly known as: RESTASIS Apply 1 drop to eye 2 (two) times daily.  DULoxetine 60 MG capsule Commonly known as: CYMBALTA Take 60 mg by mouth 2 (two) times daily.   ezetimibe 10 MG tablet Commonly known as: ZETIA TAKE ONE TABLET BY MOUTH ONCE DAILY.   fenofibrate 54 MG tablet Take 54 mg by mouth daily.   FreeStyle [employer] 2 Sensor Misc See admin instructions.   furosemide 40 MG tablet Commonly known as: LASIX Take 1 tablet (40 mg total) by mouth as needed (Weight increase of 3 pounds in 1 day or 5 pounds in 1 week.).   losartan 100 MG tablet Commonly known as: COZAAR Take 1 tablet by mouth daily.   metFORMIN 500 MG tablet Commonly known as: GLUCOPHAGE Take 500 mg by mouth 2 (two) times daily with a meal.   metoprolol succinate 50 MG 24 hr tablet Commonly  known as: TOPROL-XL Take 1 tablet (50 mg total) by mouth daily. With or immediately following a meal.   mirtazapine 7.5 MG tablet Commonly known as: REMERON Take 7.5 mg by mouth at bedtime.   multivitamin with minerals Tabs tablet Take 1 tablet by mouth daily.   OneTouch Verio test strip Generic drug: glucose blood   PRESCRIPTION MEDICATION Inhale into the lungs at bedtime. CPAP   Sure Comfort Pen Needles 31G X 5 MM Misc Generic drug: Insulin Pen Needle   SYSTANE OP Place 1-2 drops into both eyes 4 (four) times daily as needed (dry eyes).   Toujeo SoloStar 300 UNIT/ML Solostar Pen Generic drug: insulin glargine (1 Unit Dial) Inject 70 Units into the skin 2 (two) times daily.        Follow-up Information     Creola Corn, MD Follow up in 2 week(s).   Specialty: Internal Medicine Why: Hospital follow up Contact information: [address] [place] [address] [phone]         Micki Riley, MD Follow up.   Specialties: Neurology, Radiology Why: as scheduled Contact information: [address] [place] [address] [phone]                Discharge Exam: Ceasar Mons Weights   07/30/23 0138  Weight: 109.3 kg   General exam: Awake, laying in bed, in nad Respiratory system: Normal respiratory effort, no wheezing Cardiovascular system: regular rate, s1, s2 Gastrointestinal system: Soft, nondistended, positive BS Central nervous system: CN2-12 grossly intact, strength intact Extremities: Perfused, no clubbing Skin: Normal skin turgor, no notable skin lesions seen Psychiatry: Mood normal // no visual hallucinations   Condition at discharge: fair  The results of significant diagnostics from this hospitalization (including imaging, microbiology, ancillary and laboratory) are listed below for reference.   Imaging Studies: MR BRAIN WO CONTRAST  Result Date: 07/31/2023 CLINICAL DATA:  Provided history: Neuro deficit, acute, stroke  suspected. EXAM: MRI HEAD WITHOUT CONTRAST TECHNIQUE: Multiplanar, multiecho pulse sequences of the brain and surrounding structures were obtained without intravenous contrast. COMPARISON:  Non-contrast head CT and CT angiogram head/neck 07/30/2023. Brain MRI 04/24/2021. FINDINGS: An abbreviated examination was performed to comply with the patient's deep brain stimulator lead scanning conditions. The following sequences were acquired: axial diffusion-weighted, coronal diffusion-weighted, axial T1-weighted, axial T2 FLAIR, axial T2 and axial SWI. Routine sagittal T1-weighted and coronal T2 sequences were not acquired. Within this limitation, findings are as follows. Brain: Mild generalized cerebral atrophy. Redemonstrated left-sided deep brain stimulator lead terminating in the region of the left subthalamic nucleus. Small chronic lacunar infarct within the right centrum semiovale, new from the prior brain MRI of 04/24/2021 (series 11, image 18).  Redemonstrated chronic lacunar infarcts within the right corona radiata and bilateral deep gray nuclei. Background mild-to-moderate multifocal T2 FLAIR hyperintense signal abnormality within the cerebral white matter, nonspecific but compatible with chronic small vessel ischemic disease. Minimal chronic small vessel ischemic changes within the pons. Chronic microhemorrhage within the right aspect of the pons, new from the prior brain MRI. There is no acute infarct. No evidence of an intracranial mass. No extra-axial fluid collection. No midline shift. Vascular: Maintained flow voids within the proximal large arterial vessels. Skull and upper cervical spine: Bilateral frontoparietal burr holes. No focal suspicious marrow lesion. Sinuses/Orbits: Susceptibility artifact partially obscures the left orbit. No appreciable acute orbital abnormality. Prior right ocular lens replacement. No significant paranasal sinus disease. IMPRESSION: 1. Abbreviated examination performed to  comply with the patient's deep brain stimulator lead scanning conditions. 2.  No evidence of an acute intracranial abnormality. 3. Cerebral atrophy, chronic small vessel ischemic disease and chronic lacunar infarcts as described. Of note, a chronic lacunar infarct within the right centrum semiovale is new from the prior brain MRI of 04/24/2021. A chronic microhemorrhage within the right aspect is also new from the prior MRI. Electronically Signed   By: Jackey Loge D.O.   On: 07/31/2023 15:03   EEG adult  Result Date: 07/30/2023 Charlsie Quest, MD     07/30/2023  4:11 PM Patient Name: Joshua [name] MRN: [id number] Epilepsy Attending: Charlsie Quest Referring Physician/Provider: Tomie China, MD Date: 07/30/2023 Duration: 28.17 mins Patient history: 72 y.o. male with difficulty speaking getting eeg to evaluate for seizure Level of alertness: Awake, asleep AEDs during EEG study: Technical aspects: This EEG study was done with scalp electrodes positioned according to the 10-20 International system of electrode placement. Electrical activity was reviewed with band pass filter of 1-70Hz , sensitivity of 7 uV/mm, display speed of 61mm/sec with a 60Hz  notched filter applied as appropriate. EEG data were recorded continuously and digitally stored.  Video monitoring was available and reviewed as appropriate. Description: The posterior dominant rhythm consists of 8-9 Hz activity of moderate voltage (25-35 uV) seen predominantly in posterior head regions, symmetric and reactive to eye opening and eye closing. Sleep was characterized by vertex waves, sleep spindles (12 to 14 Hz), maximal frontocentral region. Physiologic photic driving was not seen during photic stimulation.  Hyperventilation was not performed.   IMPRESSION: This study is within normal limits. No seizures or epileptiform discharges were seen throughout the recording. A normal interictal EEG does not exclude the diagnosis of epilepsy. Charlsie Quest    DG Chest Portable 1 View  Result Date: 07/30/2023 CLINICAL DATA:  Hypoxia EXAM: PORTABLE CHEST 1 VIEW COMPARISON:  07/30/2022 FINDINGS: Heart and mediastinal contours within normal limits. Low lung volumes, bibasilar atelectasis. No effusions or pneumothorax. No acute bony abnormality. Right upper chest wall stimulator battery pack again noted, unchanged. IMPRESSION: Low lung volumes, bibasilar atelectasis. Electronically Signed   By: Charlett Nose M.D.   On: 07/30/2023 03:43   CT HEAD CODE STROKE WO CONTRAST  Result Date: 07/30/2023 CLINICAL DATA:  Initial evaluation for neuro deficit, stroke, facial droop. EXAM: CT ANGIOGRAPHY HEAD AND NECK TECHNIQUE: Multidetector CT imaging of the head and neck was performed using the standard protocol during bolus administration of intravenous contrast. Multiplanar CT image reconstructions and MIPs were obtained to evaluate the vascular anatomy. Carotid stenosis measurements (when applicable) are obtained utilizing NASCET criteria, using the distal internal carotid diameter as the denominator. RADIATION DOSE REDUCTION: This exam was performed according to the  departmental dose-optimization program which includes automated exposure control, adjustment of the mA and/or kV according to patient size and/or use of iterative reconstruction technique. CONTRAST:  75mL OMNIPAQUE IOHEXOL 350 MG/ML SOLN COMPARISON:  Prior study from 04/22/2021. FINDINGS: CT HEAD FINDINGS Brain: Atrophy with moderate chronic microvascular ischemic disease. Remote lacunar infarct present at the right basal ganglia. Left-sided DBS electrode in place. No acute intracranial hemorrhage. No acute large vessel territory infarct. No mass lesion or midline shift. No hydrocephalus or extra-axial fluid collection. Vascular: No abnormal hyperdense vessel. Skull: No acute scalp soft tissue abnormality.  Calvarium intact. Sinuses/Orbits: Globes and orbital soft tissues demonstrate no acute finding. Retained  metallic density present anterior to the left lobe (series 4, image 19). Paranasal sinuses are clear. No mastoid effusion. Other: None. ASPECTS (Alberta Stroke Program Early CT Score) - Ganglionic level infarction (caudate, lentiform nuclei, internal capsule, insula, M1-M3 cortex): 7 - Supraganglionic infarction (M4-M6 cortex): 3 Total score (0-10 with 10 being normal): 10 - CTA NECK FINDINGS Aortic arch: Visualized aortic arch within normal limits for caliber. Bovine branching pattern noted. Scattered plaque about the arch and origin of the great vessels without hemodynamically significant stenosis. Specific note is made of some soft plaque/thrombus protruding into the lumen of the proximal left subclavian artery (series 7, image 341). Right carotid system: Right common and internal carotid arteries are tortuous without dissection. Mild atheromatous change about the right carotid bulb without hemodynamically significant stenosis. Left carotid system: Left common and internal carotid arteries are tortuous but patent without dissection. Moderate atheromatous change about the left carotid bulb without hemodynamically significant stenosis. Vertebral arteries: Both vertebral arteries arise from subclavian arteries. Vertebral arteries are mildly tortuous but patent without stenosis or dissection. Skeleton: No discrete or worrisome osseous lesions. Other neck: No acute finding. Upper chest: No acute finding.  Right-sided pacemaker/AICD in place. Review of the MIP images confirms the above findings CTA HEAD FINDINGS Anterior circulation: Moderate atheromatous change about the carotid siphons without hemodynamically significant stenosis. A1 segments patent bilaterally. Right A1 hypoplastic. Normal anterior communicating artery complex. Anterior cerebral arteries patent without stenosis. Normal in stenosis or occlusion. No proximal MCA branch occlusion or high-grade stenosis. Distal MCA branches perfused and symmetric.  Posterior circulation: Both V4 segments patent without stenosis. Right PICA patent. Left PICA not seen. Basilar widely patent. Superior cerebellar and posterior cerebral arteries patent bilaterally. Venous sinuses: Patent allowing for timing the contrast bolus. Anatomic variants: As above. Intracranial circulation is somewhat diffusely dolichoectatic in appearance. No aneurysm. Review of the MIP images confirms the above findings IMPRESSION: CT HEAD: 1. No acute intracranial abnormality. 2. Aspects is 10. 3. Atrophy with moderate chronic small vessel ischemic disease, with remote right basal ganglia lacunar infarct. CTA HEAD AND NECK: 1. Negative CTA for large vessel occlusion or other emergent finding. 2. Moderate atheromatous change about the carotid bifurcations and carotid siphons without hemodynamically significant stenosis. 3. Diffuse tortuosity of the major arterial vasculature of the head and neck, suggesting chronic underlying hypertension. These results were communicated to Dr. Amada Jupiter at 1:36 am on 07/30/2023 by text page via the [facility] messaging system. Electronically Signed   By: Rise Mu M.D.   On: 07/30/2023 01:51   CT ANGIO HEAD NECK W WO CM (CODE STROKE)  Result Date: 07/30/2023 CLINICAL DATA:  Initial evaluation for neuro deficit, stroke, facial droop. EXAM: CT ANGIOGRAPHY HEAD AND NECK TECHNIQUE: Multidetector CT imaging of the head and neck was performed using the standard protocol during bolus administration of intravenous contrast.  Multiplanar CT image reconstructions and MIPs were obtained to evaluate the vascular anatomy. Carotid stenosis measurements (when applicable) are obtained utilizing NASCET criteria, using the distal internal carotid diameter as the denominator. RADIATION DOSE REDUCTION: This exam was performed according to the departmental dose-optimization program which includes automated exposure control, adjustment of the mA and/or kV according to patient size  and/or use of iterative reconstruction technique. CONTRAST:  75mL OMNIPAQUE IOHEXOL 350 MG/ML SOLN COMPARISON:  Prior study from 04/22/2021. FINDINGS: CT HEAD FINDINGS Brain: Atrophy with moderate chronic microvascular ischemic disease. Remote lacunar infarct present at the right basal ganglia. Left-sided DBS electrode in place. No acute intracranial hemorrhage. No acute large vessel territory infarct. No mass lesion or midline shift. No hydrocephalus or extra-axial fluid collection. Vascular: No abnormal hyperdense vessel. Skull: No acute scalp soft tissue abnormality.  Calvarium intact. Sinuses/Orbits: Globes and orbital soft tissues demonstrate no acute finding. Retained metallic density present anterior to the left lobe (series 4, image 19). Paranasal sinuses are clear. No mastoid effusion. Other: None. ASPECTS (Alberta Stroke Program Early CT Score) - Ganglionic level infarction (caudate, lentiform nuclei, internal capsule, insula, M1-M3 cortex): 7 - Supraganglionic infarction (M4-M6 cortex): 3 Total score (0-10 with 10 being normal): 10 - CTA NECK FINDINGS Aortic arch: Visualized aortic arch within normal limits for caliber. Bovine branching pattern noted. Scattered plaque about the arch and origin of the great vessels without hemodynamically significant stenosis. Specific note is made of some soft plaque/thrombus protruding into the lumen of the proximal left subclavian artery (series 7, image 341). Right carotid system: Right common and internal carotid arteries are tortuous without dissection. Mild atheromatous change about the right carotid bulb without hemodynamically significant stenosis. Left carotid system: Left common and internal carotid arteries are tortuous but patent without dissection. Moderate atheromatous change about the left carotid bulb without hemodynamically significant stenosis. Vertebral arteries: Both vertebral arteries arise from subclavian arteries. Vertebral arteries are mildly  tortuous but patent without stenosis or dissection. Skeleton: No discrete or worrisome osseous lesions. Other neck: No acute finding. Upper chest: No acute finding.  Right-sided pacemaker/AICD in place. Review of the MIP images confirms the above findings CTA HEAD FINDINGS Anterior circulation: Moderate atheromatous change about the carotid siphons without hemodynamically significant stenosis. A1 segments patent bilaterally. Right A1 hypoplastic. Normal anterior communicating artery complex. Anterior cerebral arteries patent without stenosis. Normal in stenosis or occlusion. No proximal MCA branch occlusion or high-grade stenosis. Distal MCA branches perfused and symmetric. Posterior circulation: Both V4 segments patent without stenosis. Right PICA patent. Left PICA not seen. Basilar widely patent. Superior cerebellar and posterior cerebral arteries patent bilaterally. Venous sinuses: Patent allowing for timing the contrast bolus. Anatomic variants: As above. Intracranial circulation is somewhat diffusely dolichoectatic in appearance. No aneurysm. Review of the MIP images confirms the above findings IMPRESSION: CT HEAD: 1. No acute intracranial abnormality. 2. Aspects is 10. 3. Atrophy with moderate chronic small vessel ischemic disease, with remote right basal ganglia lacunar infarct. CTA HEAD AND NECK: 1. Negative CTA for large vessel occlusion or other emergent finding. 2. Moderate atheromatous change about the carotid bifurcations and carotid siphons without hemodynamically significant stenosis. 3. Diffuse tortuosity of the major arterial vasculature of the head and neck, suggesting chronic underlying hypertension. These results were communicated to Dr. Amada Jupiter at 1:36 am on 07/30/2023 by text page via the [facility] messaging system. Electronically Signed   By: Rise Mu M.D.   On: 07/30/2023 01:51   DG Foot Complete Right  Result Date: 07/20/2023 Please  see detailed radiograph report in office  note.  DG Foot Complete Left  Result Date: 07/20/2023 Please see detailed radiograph report in office note.  MYOCARDIAL PERFUSION IMAGING  Result Date: 07/05/2023   Lexiscan stress is electrically nondiagnostic.   Myoview scan shows normal perfusion.  No ischemia or scar   LVEF on gating calcluated at 45% though visually appears normal.  COnsider echo to further define LVEF   Overall low risk study   Prior study not available for comparison.    Microbiology: Results for orders placed or performed during the hospital encounter of 07/30/23  Resp panel by RT-PCR (RSV, Flu A&B, Covid) Anterior Nasal Swab     Status: None   Collection Time: 07/30/23  1:36 AM   Specimen: Anterior Nasal Swab  Result Value Ref Range Status   SARS Coronavirus 2 by RT PCR NEGATIVE NEGATIVE Final   Influenza A by PCR NEGATIVE NEGATIVE Final   Influenza B by PCR NEGATIVE NEGATIVE Final    Comment: (NOTE) The Xpert Xpress SARS-CoV-2/FLU/RSV plus assay is intended as an aid in the diagnosis of influenza from Nasopharyngeal swab specimens and should not be used as a sole basis for treatment. Nasal washings and aspirates are unacceptable for Xpert Xpress SARS-CoV-2/FLU/RSV testing.  Fact Sheet for Patients: BloggerCourse.com  Fact Sheet for Healthcare Providers: SeriousBroker.it  This test is not yet approved or cleared by the [place] FDA and has been authorized for detection and/or diagnosis of SARS-CoV-2 by FDA under an Emergency Use Authorization (EUA). This EUA will remain in effect (meaning this test can be used) for the duration of the COVID-19 declaration under Section 564(b)(1) of the Act, 21 U.S.C. section 360bbb-3(b)(1), unless the authorization is terminated or revoked.     Resp Syncytial Virus by PCR NEGATIVE NEGATIVE Final    Comment: (NOTE) Fact Sheet for Patients: BloggerCourse.com  Fact Sheet for Healthcare  Providers: SeriousBroker.it  This test is not yet approved or cleared by the [place] FDA and has been authorized for detection and/or diagnosis of SARS-CoV-2 by FDA under an Emergency Use Authorization (EUA). This EUA will remain in effect (meaning this test can be used) for the duration of the COVID-19 declaration under Section 564(b)(1) of the Act, 21 U.S.C. section 360bbb-3(b)(1), unless the authorization is terminated or revoked.  Performed at [facility], [address], [place], [place] [address]     Labs: CBC: Recent Labs  Lab 07/30/23 0113 07/30/23 0114 07/30/23 0515 07/31/23 0612  WBC 9.2  --  7.0 6.7  NEUTROABS 3.7  --   --   --   HGB 11.3* 11.2* 11.2* 12.3*  HCT 34.9* 33.0* 34.5* 37.8*  MCV 92.1  --  92.5 93.1  PLT 216  --  186 212   Basic Metabolic Panel: Recent Labs  Lab 07/30/23 0113 07/30/23 0114 07/30/23 0515 07/31/23 0612  NA 140 142 138 141  K 4.7 4.7 4.9 4.4  CL 104 106 108 106  CO2 24  --  24 26  GLUCOSE 152* 147* 146* 66*  BUN 21 25* 20 19  CREATININE 2.15* 2.30* 2.17* 1.88*  CALCIUM 8.5*  --  8.5* 9.4   Liver Function Tests: Recent Labs  Lab 07/30/23 0113  AST 29  ALT 22  ALKPHOS 67  BILITOT 0.4  PROT 5.7*  ALBUMIN 3.1*   CBG: Recent Labs  Lab 07/31/23 0614 07/31/23 0639 07/31/23 0754 07/31/23 1129 07/31/23 1612  GLUCAP 64* 66* 88 115* 143*    Discharge  time spent: less than 30 minutes.  Signed: Rickey Barbara, MD [facility] 07/31/2023

## 2023-07-31 NOTE — Discharge Instructions (Signed)
Take aspirin and plavix for three months, then just aspirin daily afterwards

## 2023-07-31 NOTE — Inpatient Diabetes Management (Signed)
Inpatient Diabetes Program Recommendations  AACE/ADA: New Consensus Statement on Inpatient Glycemic Control (2015)  Target Ranges:  Prepandial:   less than 140 mg/dL      Peak postprandial:   less than 180 mg/dL (1-2 hours)      Critically ill patients:  140 - 180 mg/dL   Lab Results  Component Value Date   GLUCAP 115 (H) 07/31/2023   HGBA1C 6.6 (H) 07/30/2023    Review of Glycemic Control  Latest Reference Range & Units 07/30/23 21:32 07/31/23 06:14 07/31/23 06:39 07/31/23 07:54 07/31/23 11:29  Glucose-Capillary 70 - 99 mg/dL 010 (H) 64 (L) 66 (L) 88 115 (H)   Diabetes history: DM  Outpatient Diabetes medications:  Toujeo 70 units bid Metformin 500 mg bid FS libre 2 Current orders for Inpatient glycemic control:  Novolog 0-6 units tid with meals and HS Semglee 40 units q HS  Inpatient Diabetes Program Recommendations:    Agree with the reduction in Semglee to once a day.  Will follow.   Thanks,  Beryl Meager, RN, BC-ADM Inpatient Diabetes Coordinator Pager [phone]  (8a-5p)

## 2023-07-31 NOTE — Progress Notes (Signed)
Responded to [facility] Consult to prayer for pt. Consult Done.  Venida Jarvis, [place], [facility], [place] [facility] [phone]

## 2023-07-31 NOTE — Progress Notes (Signed)
Previewed discharge paper work with the pt. Gave pt his medication from [facility] pharmacy. Pt is dressed with belongings ready to vacate. Pt's ride is on the way pt instructed to push the call bell for wheel chair and will be rolled out by staff when ride is at valet parking.

## 2023-08-04 DIAGNOSIS — I10 Essential (primary) hypertension: Secondary | ICD-10-CM | POA: Diagnosis not present

## 2023-08-04 DIAGNOSIS — I639 Cerebral infarction, unspecified: Secondary | ICD-10-CM | POA: Diagnosis not present

## 2023-08-04 DIAGNOSIS — G4733 Obstructive sleep apnea (adult) (pediatric): Secondary | ICD-10-CM | POA: Diagnosis not present

## 2023-08-04 DIAGNOSIS — E1121 Type 2 diabetes mellitus with diabetic nephropathy: Secondary | ICD-10-CM | POA: Diagnosis not present

## 2023-08-05 ENCOUNTER — Ambulatory Visit: Payer: Medicare PPO

## 2023-08-05 DIAGNOSIS — L97522 Non-pressure chronic ulcer of other part of left foot with fat layer exposed: Secondary | ICD-10-CM

## 2023-08-05 DIAGNOSIS — M216X9 Other acquired deformities of unspecified foot: Secondary | ICD-10-CM

## 2023-08-05 DIAGNOSIS — M2041 Other hammer toe(s) (acquired), right foot: Secondary | ICD-10-CM

## 2023-08-05 DIAGNOSIS — E11621 Type 2 diabetes mellitus with foot ulcer: Secondary | ICD-10-CM

## 2023-08-05 NOTE — Progress Notes (Signed)
Patient presents to the office today for diabetic shoe and insole measuring.  Patient was measured with brannock device to determine size and width for 1 pair of extra depth shoes and foam casted for 3 pair of insoles.   Documentation of medical necessity will be sent to patient's treating diabetic doctor to verify and sign.   Patient's diabetic provider: Creola Corn / [place]  Shoes and insoles will be ordered at that time and patient will be notified for an appointment for fitting when they arrive.   Shoe size (per patient): 13 Brannock measurement: 13 Patient shoe selection- Shoe choice:   [id number] / [id number] Shoe size ordered: 13xwd  ABN and Financials Signed   Addison Bailey CPed, CFo, CFm

## 2023-08-06 ENCOUNTER — Ambulatory Visit: Payer: Medicare PPO | Admitting: Podiatry

## 2023-08-12 NOTE — Progress Notes (Unsigned)
Cardiology Clinic Note   Patient Name: Joshua [name] Date of Encounter: 08/17/2023  Primary Care Provider:  Creola Corn, MD Primary Cardiologist:  Little Ishikawa, MD  Patient Profile     Joshua [name] presents to the clinic today for follow-up evaluation of his dyspnea and cardiomyopathy.  Past Medical History    Past Medical History:  Diagnosis Date   Benign essential tremor    Benign positional vertigo    Chronic kidney disease 08/28/2019   CVA (cerebral vascular accident) (HCC)    x2 - L retina, 1 right parietal   Degenerative arthritis    Depression    Diabetes mellitus    DVT (deep venous thrombosis) (HCC) 2018   Dyslipidemia    GERD (gastroesophageal reflux disease)    hiatal hernia   Gout    H/O: vasectomy    Hearing aid worn    b/l   Hx of appendectomy    Hx of tonsillectomy    Hypertension    Hypertrophic cardiomyopathy (HCC)    Ischemic optic neuropathy    on the left   Melanoma (HCC)    NSVT (nonsustained ventricular tachycardia) (HCC)    1 4 beat run on event monitor in 07/2020   Obesity    OSA on CPAP    setting = 5   Pulmonary emboli (HCC) 2018   PVC's (premature ventricular contractions)    SVT (supraventricular tachycardia)    by event monitor   Tremor, essential 06/22/2017   Wears glasses    Past Surgical History:  Procedure Laterality Date   APPENDECTOMY     arthroscopic knee surgery Bilateral    CATARACT EXTRACTION Bilateral    COLONOSCOPY     MINOR PLACEMENT OF FIDUCIAL N/A 06/30/2019   Procedure: Fiducial placement;  Surgeon: Maeola Harman, MD;  Location: [facility] OR;  Service: Neurosurgery;  Laterality: N/A;  Fiducial placement   NASAL SEPTUM SURGERY     PULSE GENERATOR IMPLANT N/A 07/14/2019   Procedure: Left cranial Implanted Pulse Generator and lead extension placement to right chest ;  Surgeon: Maeola Harman, MD;  Location: [facility] OR;  Service: Neurosurgery;  Laterality: N/A;   SUBTHALAMIC STIMULATOR INSERTION  Left 07/07/2019   Procedure: LEFT DEEP BRAIN STIMULATOR PLACEMENT;  Surgeon: Maeola Harman, MD;  Location: [facility] OR;  Service: Neurosurgery;  Laterality: Left;   TEE WITHOUT CARDIOVERSION N/A 04/23/2021   Procedure: TRANSESOPHAGEAL ECHOCARDIOGRAM (TEE);  Surgeon: Elease Hashimoto Deloris Ping, MD;  Location: [facility] ENDOSCOPY;  Service: Cardiovascular;  Laterality: N/A;   TONSILLECTOMY     TOTAL KNEE ARTHROPLASTY Left 03/30/2017   Procedure: LEFT TOTAL KNEE ARTHROPLASTY;  Surgeon: Durene Romans, MD;  Location: [facility] ORS;  Service: Orthopedics;  Laterality: Left;   WISDOM TOOTH EXTRACTION      Allergies  Allergies  Allergen Reactions   Melatonin Other (See Comments)    dizzy    History of Present Illness     Joshua [name] has a PMH of DOE, hypertrophic cardiomyopathy, CVA, HTN, HLD, PVCs, OSA on CPAP, fatigue, and CKD stage IIIa.  10/20 he was hospitalized in the setting of CVA.  A CTA of his head and neck showed subclavian artery thrombus.  MRI of his brain showed right internal capsule infarct.  His echocardiogram showed no evidence of cardiac source of embolism.  He was started on apixaban.  This cardiac event monitor showed no evidence of arrhythmia or atrial fibrillation.  Start echocardiogram showed asymmetrical basal Supple hypertrophy.  He was referred  to cardiology for further evaluation.  His cardiac MRI showed basal septal hypertrophy measuring up to 20 mm.  He underwent PYP scan for evaluation of amyloid.  No evidence of amyloid was noted.  His cardiac event monitor showed occasional PVCs, he underwent repeat cardiac event monitor 9/21 which showed 1 4 beat run of NSVT, 6 runs of SVT and occasional PVCs.  His only syncopal episode occurred during his CVA.  He was hospitalized 3/22 with shortness of breath in the setting of pericardial effusion.  His echocardiogram 4/22 showed resolution of his pericardial effusion EF 55-60%, G1 DD, moderate LVH.  Stress testing 3/22 showed no evidence of ischemia and EF of  53%.  Repeat PYP scan 7/22 showed no evidence of amyloidosis.  SPEP/UPEP/light chains showed no evidence of AL amyloid.  He was seen in cardiology clinic 3/23 and remained stable from a cardiac standpoint.  He continued to have ongoing dyspnea with minimal exertion.  His echocardiogram showed an EF of 60-65%, normal LV function, moderate concentric LVH and intermediate diastolic parameters.  His ascending aorta was noted to be 42 mm.  He presented to the emergency department 3/24 with paresthesias.  He had been seen by neurology who recommended ED visit due to his previous high risk for CVA.  His head CT showed no acute infarct, hemorrhage, or hydrocephalus.  He was seen by Reather Littler NP on 06/25/2023.  He reported further worsening of shortness of breath and DOE.  He repored that starting in January his breathing had become worse.  He was taking longer to recover.  He noted increased fatigue.  He denied chest pain.  He denied weight gain, lower extremity swelling, orthopnea and PND.  He had not had any recent long distance travel.  He reported compliance with his apixaban.  He denied bleeding issues.  He denied recent illness fever cough or chills.  There was low suspicion for PE.  He was referred to pulmonology for further evaluation.  He was admitted to the hospital on 07/30/2023 and discharged on 07/31/2023.  He reported that he was having an uneventful evening.  He was in bed.  He had just turned on his CPAP.  He noted a vague sensation that something was wrong.  He was trying to communicate with his wife and was noted to have slurred speech.  He underwent head CT which showed no acute findings.  An MRI showed no infarct but new chronic right lacunar infarct compared to MRI from 2022.  Results were reviewed with neurology.  Recommendations for aspirin with Plavix x 3 months then aspirin indefinitely were recommended.  He presents to the clinic today for follow-up evaluation and states he continues to notice  dyspnea with exertion.  We reviewed his echocardiogram and stress testing.  He and his wife expressed understanding.  We reviewed his recent hospitalization for CVA.  I reviewed recommendations for referral to pulmonology.  He also notes urinary urgency and I will refer to urology.  I will refill his Plavix.  Will plan follow-up in 4 to 6 months.  Today he denies chest pain, increased shortness of breath, lower extremity edema, fatigue, palpitations, melena, hematuria, hemoptysis, diaphoresis, weakness, presyncope, syncope, orthopnea, and PND.   Home Medications    Prior to Admission medications   Medication Sig Start Date End Date Taking? Authorizing Provider  amLODipine (NORVASC) 10 MG tablet TAKE ONE TABLET AT BEDTIME. 04/02/23   Little Ishikawa, MD  aspirin 81 MG EC tablet 81 mg daily. 04/23/21  [provider]  atorvastatin (LIPITOR) 80 MG tablet Take 1 tablet (80 mg total) by mouth daily at 6 PM. Patient taking differently: Take 40 mg by mouth daily at 6 PM. 08/31/19   Danford, Earl Lites, MD  clopidogrel (PLAVIX) 75 MG tablet Take 1 tablet (75 mg total) by mouth daily. 07/31/23 10/29/23  Jerald Kief, MD  colchicine 0.6 MG tablet colchicine 0.6 mg tablet  TAKE ONE TABLET BY MOUTH TWICE DAILY. Patient not taking: Reported on 07/30/2023    [provider]  Continuous Blood Gluc Sensor (FREESTYLE LIBRE 2 SENSOR) MISC See admin instructions.    [provider]  cycloSPORINE (RESTASIS) 0.05 % ophthalmic emulsion Apply 1 drop to eye 2 (two) times daily. 01/29/21   [provider]  DULoxetine (CYMBALTA) 60 MG capsule Take 60 mg by mouth 2 (two) times daily.    [provider]  ezetimibe (ZETIA) 10 MG tablet TAKE ONE TABLET BY MOUTH ONCE DAILY. 03/25/23   Little Ishikawa, MD  fenofibrate 54 MG tablet Take 54 mg by mouth daily.    [provider]  furosemide (LASIX) 40 MG tablet Take 1 tablet (40 mg total) by mouth as needed (Weight  increase of 3 pounds in 1 day or 5 pounds in 1 week.). 03/26/21   Little Ishikawa, MD  Insulin Glargine, 1 Unit Dial, (TOUJEO SOLOSTAR) 300 UNIT/ML SOPN Inject 70 Units into the skin 2 (two) times daily.    [provider]  losartan (COZAAR) 100 MG tablet Take 1 tablet by mouth daily. 03/25/20   [provider]  metFORMIN (GLUCOPHAGE) 500 MG tablet Take 500 mg by mouth 2 (two) times daily with a meal. 03/25/20   [provider]  metoprolol succinate (TOPROL-XL) 50 MG 24 hr tablet Take 1 tablet (50 mg total) by mouth daily. With or immediately following a meal. 04/02/23   Little Ishikawa, MD  mirtazapine (REMERON) 7.5 MG tablet Take 7.5 mg by mouth at bedtime.    [provider]  Multiple Vitamin (MULTIVITAMIN WITH MINERALS) TABS tablet Take 1 tablet by mouth daily.    [provider]  [facility] VERIO test strip  09/13/18   [provider]  Polyethyl Glycol-Propyl Glycol (SYSTANE OP) Place 1-2 drops into both eyes 4 (four) times daily as needed (dry eyes).     [provider]  PRESCRIPTION MEDICATION Inhale into the lungs at bedtime. CPAP    [provider]  SURE COMFORT PEN NEEDLES 31G X 5 MM MISC  09/12/19   [provider]    Family History    Family History  Problem Relation Age of Onset   Cerebral aneurysm Mother    Tremor Mother    Alzheimer's disease Father    Tremor Brother    Tremor Maternal Uncle    Diabetes Maternal Grandmother    Healthy Son    Colon cancer Neg Hx    He indicated that his mother is deceased. He indicated that his father is deceased. He indicated that his brother is alive. He indicated that the status of his maternal grandmother is unknown. He indicated that his son is alive. He indicated that the status of his maternal uncle is unknown. He indicated that the status of his neg hx is unknown.  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  Tobacco Use   Smoking status: Former    Current packs/day: 0.00    Average packs/day: 1 pack/day for 10.0 years (10.0 ttl pk-yrs)    Types: Cigarettes    Start date: 11/24/1973    Quit date: 11/25/1983    Years since quitting: 39.7   Smokeless tobacco: Never  Vaping Use   Vaping status: Never Used  Substance and Sexual Activity   Alcohol use: Yes    Alcohol/week: 2.0 standard drinks of alcohol    Types: 2 Standard drinks or equivalent per week    Comment: occassionally   Drug use: No   Sexual activity: Not on file  Other Topics Concern   Not on file  Social History Narrative   Patient lives at home with wife. Larita Fife(   Patient has 2 children that are in good health.    Patient works for [employer]. Retired .   Patient has a Bachelors degree in History.       Right handed    Lives in one story home - Handicap accessible    Social Determinants of Health   Financial Resource Strain: Not on file  Food Insecurity: No Food Insecurity (07/30/2023)   Hunger Vital Sign    Worried About Running Out of Food in the Last Year: Never true    Ran Out of Food in the Last Year: Never true  Transportation Needs: Unmet Transportation Needs (07/30/2023)   PRAPARE - Administrator, Civil Service (Medical): Yes    Lack of Transportation (Non-Medical): Yes  Physical Activity: Not on file  Stress: Not on file  Social Connections: Not on file  Intimate Partner Violence: Not At Risk (07/30/2023)   Humiliation, Afraid, Rape, and Kick questionnaire    Fear of Current or Ex-Partner: No    Emotionally Abused: No    Physically Abused: No    Sexually Abused: No     Review of Systems    General:  No chills, fever,  night sweats or weight changes.  Cardiovascular:  No chest pain, dyspnea on exertion, edema, orthopnea, palpitations, paroxysmal nocturnal dyspnea. Dermatological: No rash, lesions/masses Respiratory: No cough, dyspnea Urologic: No hematuria, dysuria Abdominal:   No nausea, vomiting, diarrhea, bright red blood per rectum, melena, or hematemesis Neurologic:  No visual changes, wkns, changes in mental status. All other systems reviewed and are otherwise negative except as noted above.  Physical Exam    VS:  BP 106/66   Pulse (!) 53   Ht 6\' 4"  (1.93 m)   Wt 247 lb (112 kg)   SpO2 91%   BMI 30.07 kg/m  , BMI Body mass index is 30.07 kg/m. GEN: Well nourished, well developed, in no acute distress. HEENT: normal. Neck: Supple, no JVD, carotid bruits, or masses. Cardiac: RRR, no murmurs, rubs, or gallops. No clubbing, cyanosis, edema.  Radials/DP/PT 2+ and equal bilaterally.  Respiratory:  Respirations regular and unlabored, clear to auscultation bilaterally. GI: Soft, nontender, nondistended, BS + x 4. MS: no deformity or atrophy. Skin: warm and dry, no rash. Neuro:  Strength and sensation are intact. Psych: Normal affect.  Accessory Clinical Findings    Recent Labs: 06/25/2023: BNP 27.5 07/30/2023: ALT 22 07/31/2023: BUN 19; Creatinine, Ser 1.88; Hemoglobin 12.3; Platelets 212; Potassium 4.4; Sodium 141   Recent Lipid Panel  Component Value Date/Time   CHOL 141 07/31/2023 0612   CHOL 161 10/24/2020 0943   TRIG 178 (H) 07/31/2023 0612   HDL 35 (L) 07/31/2023 0612   HDL 30 (L) 10/24/2020 0943   CHOLHDL 4.0 07/31/2023 0612   VLDL 36 07/31/2023 0612   LDLCALC 70 07/31/2023 0612   LDLCALC 99 10/24/2020 0943         ECG personally reviewed by me today- None today.    Echocardiogram 06/26/2023  IMPRESSIONS     1. Left ventricular ejection fraction, by estimation, is 60 to 65%. The  left ventricle has normal function. The left ventricle has no regional  wall motion  abnormalities. There is severe left ventricular hypertrophy of  the basal-septal segment. Left  ventricular diastolic parameters are consistent with Grade I diastolic  dysfunction (impaired relaxation).   2. Right ventricular systolic function is normal. The right ventricular  size is normal.   3. The mitral valve is normal in structure. No evidence of mitral valve  regurgitation. No evidence of mitral stenosis.   4. The aortic valve is tricuspid. There is moderate calcification of the  aortic valve. Aortic valve regurgitation is not visualized. Aortic valve  sclerosis is present, with no evidence of aortic valve stenosis.   5. Aortic dilatation noted. There is mild dilatation of the ascending  aorta, measuring 43 mm.   6. The inferior vena cava is normal in size with greater than 50%  respiratory variability, suggesting right atrial pressure of 3 mmHg.   FINDINGS   Left Ventricle: Left ventricular ejection fraction, by estimation, is 60  to 65%. The left ventricle has normal function. The left ventricle has no  regional wall motion abnormalities. Definity contrast agent was given IV  to delineate the left ventricular   endocardial borders. The left ventricular internal cavity size was normal  in size. There is severe left ventricular hypertrophy of the basal-septal  segment. Left ventricular diastolic parameters are consistent with Grade I  diastolic dysfunction  (impaired relaxation).   Right Ventricle: The right ventricular size is normal. No increase in  right ventricular wall thickness. Right ventricular systolic function is  normal.   Left Atrium: Left atrial size was normal in size.   Right Atrium: Right atrial size was normal in size.   Pericardium: There is no evidence of pericardial effusion.   Mitral Valve: The mitral valve is normal in structure. No evidence of  mitral valve regurgitation. No evidence of mitral valve stenosis.   Tricuspid Valve: The tricuspid valve  is normal in structure. Tricuspid  valve regurgitation is trivial. No evidence of tricuspid stenosis.   Aortic Valve: The aortic valve is tricuspid. There is moderate  calcification of the aortic valve. Aortic valve regurgitation is not  visualized. Aortic valve sclerosis is present, with no evidence of aortic  valve stenosis. Aortic valve mean gradient  measures 6.0 mmHg. Aortic valve peak gradient measures 10.8 mmHg. Aortic  valve area, by VTI measures 3.22 cm.   Pulmonic Valve: The pulmonic valve was normal in structure. Pulmonic valve  regurgitation is not visualized. No evidence of pulmonic stenosis.   Aorta: Aortic dilatation noted. There is mild dilatation of the ascending  aorta, measuring 43 mm.   Venous: The inferior vena cava is normal in size with greater than 50%  respiratory variability, suggesting right atrial pressure of 3 mmHg.   IAS/Shunts: No atrial level shunt detected by color flow Doppler.    Nuclear stress test 07/03/2023  Lexiscan stress is electrically nondiagnostic.   Myoview scan shows normal perfusion.  No ischemia or scar   LVEF on gating calcluated at 45% though visually appears normal.  COnsider echo to further define LVEF   Overall low risk study   Prior study not available for comparison.      Assessment & Plan   1.  Hypertrophic cardiomyopathy-appears well compensated.  Weight stable.  Remains fairly sedentary.  Cardiac MRI showed no evidence of amyloidosis.  Subsequent PYP scans showed no evidence of amyloid. Continue metoprolol, furosemide as needed Heart healthy low-sodium diet Increase physical activity as tolerated  DOE-stable.  Previously referred to pulmonology for further evaluation and management. Increase physical activity as tolerated.  BNP 06/25/2023 was noted to be 27.5 Follow-up with pulmonology Recommend pulmonary function test  Essential hypertension-BP today 106/66 Continue current medical therapy Maintain blood  pressure log Increase physical activity as tolerated  OSA-reports compliance with CPAP.  Waking up well rested. Continue weight loss Sleep hygiene reviewed Continue CPAP use  History of CVA-recently seen in the emergency department on 07/30/2023.  MRI showed new chronic right lacunar infarct. Continue aspirin and Plavix Follows with neurology  Frequent urination-Notes episodes of urination every 1-1.5 hours. Refer to urology  Disposition: Follow-up with Dr. Bjorn Pippin or me in 4-6 months.   Thomasene Ripple. Aradia Estey NP-C     08/17/2023, 1:53 PM [facility] [address] Office [phone] Fax [phone]    I spent 14 minutes examining this patient, reviewing medications, and using patient centered shared decision making involving her cardiac care.  Prior to her visit I spent greater than 20 minutes reviewing her past medical history,  medications, and prior cardiac tests.

## 2023-08-17 ENCOUNTER — Ambulatory Visit: Payer: Medicare PPO | Attending: General Practice | Admitting: General Practice

## 2023-08-17 ENCOUNTER — Encounter: Payer: Self-pay | Admitting: General Practice

## 2023-08-17 VITALS — BP 106/66 | HR 53 | Ht 76.0 in | Wt 247.0 lb

## 2023-08-17 DIAGNOSIS — R350 Frequency of micturition: Secondary | ICD-10-CM | POA: Diagnosis not present

## 2023-08-17 DIAGNOSIS — R0609 Other forms of dyspnea: Secondary | ICD-10-CM

## 2023-08-17 DIAGNOSIS — I422 Other hypertrophic cardiomyopathy: Secondary | ICD-10-CM | POA: Diagnosis not present

## 2023-08-17 DIAGNOSIS — G4733 Obstructive sleep apnea (adult) (pediatric): Secondary | ICD-10-CM

## 2023-08-17 DIAGNOSIS — Z8673 Personal history of transient ischemic attack (TIA), and cerebral infarction without residual deficits: Secondary | ICD-10-CM

## 2023-08-17 DIAGNOSIS — I10 Essential (primary) hypertension: Secondary | ICD-10-CM | POA: Diagnosis not present

## 2023-08-17 NOTE — Patient Instructions (Signed)
Medication Instructions:  The current medical regimen is effective;  continue present plan and medications as directed. Please refer to the Current Medication list given to you today. *If you need a refill on your cardiac medications before your next appointment, please call your pharmacy*  Lab Work: NONE If you have labs (blood work) drawn today and your tests are completely normal, you will receive your results only by: MyChart Message (if you have MyChart) OR A paper copy in the mail If you have any lab test that is abnormal or we need to change your treatment, we will call you to review the results.  Testing/Procedures: REFERRAL TO PULMONARY AND UROLOGY   Follow-Up: At [facility], you and your health needs are our priority.  As part of our continuing mission to provide you with exceptional heart care, we have created designated Provider Care Teams.  These Care Teams include your primary Cardiologist (physician) and Advanced Practice Providers (APPs -  Physician Assistants and Nurse Practitioners) who all work together to provide you with the care you need, when you need it.  Your next appointment:   4-6 month(s)  Provider:   Little Ishikawa, MD     Other Instructions

## 2023-08-18 ENCOUNTER — Other Ambulatory Visit: Payer: Self-pay

## 2023-08-18 MED ORDER — CLOPIDOGREL BISULFATE 75 MG PO TABS: 75.0000 mg | ORAL_TABLET | Freq: Every day | ORAL | 3 refills | Status: DC

## 2023-08-20 DIAGNOSIS — R270 Ataxia, unspecified: Secondary | ICD-10-CM | POA: Diagnosis not present

## 2023-08-20 DIAGNOSIS — Z7901 Long term (current) use of anticoagulants: Secondary | ICD-10-CM | POA: Diagnosis not present

## 2023-08-20 DIAGNOSIS — I6381 Other cerebral infarction due to occlusion or stenosis of small artery: Secondary | ICD-10-CM | POA: Diagnosis not present

## 2023-08-20 DIAGNOSIS — E1121 Type 2 diabetes mellitus with diabetic nephropathy: Secondary | ICD-10-CM | POA: Diagnosis not present

## 2023-08-20 DIAGNOSIS — Z794 Long term (current) use of insulin: Secondary | ICD-10-CM | POA: Diagnosis not present

## 2023-08-20 DIAGNOSIS — I129 Hypertensive chronic kidney disease with stage 1 through stage 4 chronic kidney disease, or unspecified chronic kidney disease: Secondary | ICD-10-CM | POA: Diagnosis not present

## 2023-08-20 DIAGNOSIS — G4733 Obstructive sleep apnea (adult) (pediatric): Secondary | ICD-10-CM | POA: Diagnosis not present

## 2023-08-26 DIAGNOSIS — Z794 Long term (current) use of insulin: Secondary | ICD-10-CM | POA: Diagnosis not present

## 2023-08-26 DIAGNOSIS — E1121 Type 2 diabetes mellitus with diabetic nephropathy: Secondary | ICD-10-CM | POA: Diagnosis not present

## 2023-08-26 DIAGNOSIS — I129 Hypertensive chronic kidney disease with stage 1 through stage 4 chronic kidney disease, or unspecified chronic kidney disease: Secondary | ICD-10-CM | POA: Diagnosis not present

## 2023-08-26 DIAGNOSIS — G4733 Obstructive sleep apnea (adult) (pediatric): Secondary | ICD-10-CM | POA: Diagnosis not present

## 2023-08-26 DIAGNOSIS — Z23 Encounter for immunization: Secondary | ICD-10-CM | POA: Diagnosis not present

## 2023-08-26 DIAGNOSIS — N1831 Chronic kidney disease, stage 3a: Secondary | ICD-10-CM | POA: Diagnosis not present

## 2023-08-26 DIAGNOSIS — E785 Hyperlipidemia, unspecified: Secondary | ICD-10-CM | POA: Diagnosis not present

## 2023-08-28 ENCOUNTER — Telehealth: Payer: Self-pay | Admitting: Neurology

## 2023-08-28 NOTE — Telephone Encounter (Signed)
Called patient and let him know he understood

## 2023-08-28 NOTE — Telephone Encounter (Signed)
Patient is wanting to know if the life alert Tomma Lightning he was given since his stroke 6 weeks ago, wanting to make sure it is compatible with VDS

## 2023-09-03 DIAGNOSIS — E1121 Type 2 diabetes mellitus with diabetic nephropathy: Secondary | ICD-10-CM | POA: Diagnosis not present

## 2023-09-09 ENCOUNTER — Ambulatory Visit: Payer: Medicare PPO | Admitting: Podiatry

## 2023-09-10 ENCOUNTER — Encounter: Payer: Self-pay | Admitting: Physical Therapy

## 2023-09-10 ENCOUNTER — Ambulatory Visit: Payer: Medicare PPO | Attending: Adult Health | Admitting: Physical Therapy

## 2023-09-10 VITALS — BP 104/64 | HR 75

## 2023-09-10 DIAGNOSIS — M6281 Muscle weakness (generalized): Secondary | ICD-10-CM | POA: Insufficient documentation

## 2023-09-10 DIAGNOSIS — R29818 Other symptoms and signs involving the nervous system: Secondary | ICD-10-CM | POA: Diagnosis not present

## 2023-09-10 DIAGNOSIS — Z9181 History of falling: Secondary | ICD-10-CM | POA: Diagnosis not present

## 2023-09-10 DIAGNOSIS — R2689 Other abnormalities of gait and mobility: Secondary | ICD-10-CM | POA: Insufficient documentation

## 2023-09-10 DIAGNOSIS — R2681 Unsteadiness on feet: Secondary | ICD-10-CM | POA: Insufficient documentation

## 2023-09-10 DIAGNOSIS — G4733 Obstructive sleep apnea (adult) (pediatric): Secondary | ICD-10-CM | POA: Diagnosis not present

## 2023-09-10 NOTE — Therapy (Signed)
OUTPATIENT PHYSICAL THERAPY NEURO EVALUATION   Patient Name: Joshua [name] MRN: [id number] DOB:[birth year], 72 y.o., male Today's Date: 09/10/2023   PCP: Ronney Lion, NP  REFERRING PROVIDER: Ronney Lion, NP   END OF SESSION:  PT End of Session - 09/10/23 1517     Visit Number 1    Number of Visits 17    Date for PT Re-Evaluation 12/09/23   due to delay in scheduling   Authorization Type [employer]    PT Start Time 1149    PT Stop Time 1230    PT Time Calculation (min) 41 min    Equipment Utilized During Treatment Gait belt    Activity Tolerance Patient tolerated treatment well    Behavior During Therapy WFL for tasks assessed/performed             Past Medical History:  Diagnosis Date   Benign essential tremor    Benign positional vertigo    Chronic kidney disease 08/28/2019   CVA (cerebral vascular accident) (HCC)    x2 - L retina, 1 right parietal   Degenerative arthritis    Depression    Diabetes mellitus    DVT (deep venous thrombosis) (HCC) 2018   Dyslipidemia    GERD (gastroesophageal reflux disease)    hiatal hernia   Gout    H/O: vasectomy    Hearing aid worn    b/l   Hx of appendectomy    Hx of tonsillectomy    Hypertension    Hypertrophic cardiomyopathy (HCC)    Ischemic optic neuropathy    on the left   Melanoma (HCC)    NSVT (nonsustained ventricular tachycardia) (HCC)    1 4 beat run on event monitor in 07/2020   Obesity    OSA on CPAP    setting = 5   Pulmonary emboli (HCC) 2018   PVC's (premature ventricular contractions)    SVT (supraventricular tachycardia) (HCC)    by event monitor   Tremor, essential 06/22/2017   Wears glasses    Past Surgical History:  Procedure Laterality Date   APPENDECTOMY     arthroscopic knee surgery Bilateral    CATARACT EXTRACTION Bilateral    COLONOSCOPY     MINOR PLACEMENT OF FIDUCIAL N/A 06/30/2019   Procedure: Fiducial placement;  Surgeon: Maeola Harman, MD;  Location: [facility] OR;   Service: Neurosurgery;  Laterality: N/A;  Fiducial placement   NASAL SEPTUM SURGERY     PULSE GENERATOR IMPLANT N/A 07/14/2019   Procedure: Left cranial Implanted Pulse Generator and lead extension placement to right chest ;  Surgeon: Maeola Harman, MD;  Location: [facility] OR;  Service: Neurosurgery;  Laterality: N/A;   SUBTHALAMIC STIMULATOR INSERTION Left 07/07/2019   Procedure: LEFT DEEP BRAIN STIMULATOR PLACEMENT;  Surgeon: Maeola Harman, MD;  Location: [facility] OR;  Service: Neurosurgery;  Laterality: Left;   TEE WITHOUT CARDIOVERSION N/A 04/23/2021   Procedure: TRANSESOPHAGEAL ECHOCARDIOGRAM (TEE);  Surgeon: Elease Hashimoto Deloris Ping, MD;  Location: [facility] ENDOSCOPY;  Service: Cardiovascular;  Laterality: N/A;   TONSILLECTOMY     TOTAL KNEE ARTHROPLASTY Left 03/30/2017   Procedure: LEFT TOTAL KNEE ARTHROPLASTY;  Surgeon: Durene Romans, MD;  Location: [facility] ORS;  Service: Orthopedics;  Laterality: Left;   WISDOM TOOTH EXTRACTION     Patient Active Problem List   Diagnosis Date Noted   Dysarthria 07/30/2023   Other reduced mobility 05/08/2023   Ataxia 08/05/2021   Confusion 08/05/2021   Dystrophia unguium 08/05/2021   Encounter for fitting and adjustment of hearing aid  08/05/2021   Foot ulcer (HCC) 08/05/2021   Acute respiratory failure with hypoxia (HCC) 08/05/2021   Edema 08/05/2021   Neuropathy 08/05/2021   Pericardial effusion 08/05/2021   Staphylococcal infectious disease 08/05/2021   Unilateral primary osteoarthritis, right knee 08/05/2021   Cerebral thrombosis with cerebral infarction 04/24/2021   Bacteremia, coagulase-negative staphylococcal 04/21/2021   History of pulmonary embolism 04/21/2021   History of stroke 04/21/2021   DOE (dyspnea on exertion) 01/30/2021   Acute hypoxemic respiratory failure (HCC) 01/29/2021   Bilateral hearing loss 05/15/2020   Bilateral impacted cerumen 05/15/2020   Fungal otitis externa 05/15/2020   Impacted cerumen 05/14/2020   Hearing loss 03/27/2020   Melanocytic  nevi, unspecified 03/27/2020   Aphasia 09/01/2019   Cardiomegaly 09/01/2019   Dysphagia 09/01/2019   Cerebral embolism with cerebral infarction 08/29/2019   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HCC) 08/28/2019   Hyperlipidemia 08/28/2019   Chronic kidney disease 08/28/2019   Obesity (BMI 30-39.9) 08/28/2019   Presence of other specified devices 07/28/2019   Tremor 07/07/2019   Cough 01/13/2019   Lymphadenopathy 12/31/2018   Abnormal findings on diagnostic imaging of other specified body structures 06/18/2018   Noncompliance with treatment 03/25/2018   Epistaxis 01/14/2018   Essential tremor 06/22/2017   Fatigue 06/19/2017   Hardening of the aorta (main artery of the heart) (HCC) 04/15/2017   Long term (current) use of anticoagulants 04/14/2017   OSA on CPAP 04/10/2017   Essential hypertension 04/10/2017   Pulmonary thromboembolism (HCC) 04/10/2017   PE (pulmonary thromboembolism) (HCC) 04/09/2017   Orthostatic hypotension 04/09/2017   S/P left TKA 03/30/2017   S/P total knee replacement 03/30/2017   Electrocardiogram abnormal 07/29/2016   Constipation 05/26/2016   Varicose veins of lower extremity 05/26/2016   Cellulitis of left foot 03/07/2016   Pre-ulcerative calluses 03/07/2016   Type 2 diabetes mellitus with left diabetic foot ulcer (HCC) 01/14/2016   Diabetic peripheral neuropathy associated with type 2 diabetes mellitus (HCC) 10/19/2015   Plantar wart 08/08/2015   Encounter for general adult medical examination without abnormal findings 05/17/2015   CKD stage 3b, GFR 30-44 ml/min (HCC) 10/21/2013   Late effects of cerebrovascular disease 10/21/2013   Irritability and anger 03/04/2013   Major depression, single episode, in complete remission (HCC) 03/04/2013   Panic disorder 03/04/2013   Disequilibrium 04/17/2011   Allergic rhinitis 01/23/2011   Diabetic renal disease (HCC) 11/29/2009   Gout 09/20/2009   Osteoarthritis 09/20/2009   Proteinuria 09/20/2009   Ventricular  premature depolarization 09/20/2009   Diabetes mellitus type 2 in obese (HCC) 08/07/2008   GERD 08/07/2008    ONSET DATE: 08/20/2023  REFERRING DIAG: R27.0 (ICD-10-CM) - Ataxia, unspecified   THERAPY DIAG:  Unsteadiness on feet  Other abnormalities of gait and mobility  Muscle weakness (generalized)  Other symptoms and signs involving the nervous system  History of falling  Rationale for Evaluation and Treatment: Rehabilitation  SUBJECTIVE:  SUBJECTIVE STATEMENT: Had a big stroke in May of 2022, and reports balance has gotten progressively worse since that one. And R drop foot is worse (Started in 2022). Has started using a rollator in the past 6 months due to worsening balance. Has one for the car and one for home use. Biggest thing is not making sudden movements and trying to slow down. Notices R foot drags more in the middle of the night without shoes on. Per pt's spouse, reports he scuffs his heel and drops it. Reports getting out of breath more quickly after recent CVAs. Has an appt scheduled with the pulmonologist. Has noticed some few stumblings with his speech. Pt's  spouse also reports he talks softer. Notes he has a sedentary lifestyle. Pt wears diabetic shoes   Pt accompanied by:  Wife, Corinda Gubler HISTORY: 72 y.o. male with medical history significant for hypertension, type 2 diabetes mellitus, history of PE on Eliquis, OSA on CPAP, CKD 3B, history of CVA (October, 2020 and May, 2022) , neuropathy, and essential tremor with deep brain stimulator who presents with acute onset speech difficulty.   - No acute findings on head CT   - MRI reviewed. No new infarct but new chronic R lacunar infarct compared to MRI in 2022  PAIN:  Are you having pain? No  Vitals:   09/10/23 1213  BP:  104/64  Pulse: 75     PRECAUTIONS: Fall and Other: DBS for essential tremor   RED FLAGS: None    FALLS: Has patient fallen in last 6 months? Yes. Number of falls 3 (pre rollator), reports that he just lost his balance   LIVING ENVIRONMENT: Lives with: lives with their spouse Lives in: House/apartment - 1 story Stairs: Yes: External: 3 steps; can reach both and steps are shorter and wider , reports [facility] but is not there yet  Has following equipment at home: Environmental consultant - 4 wheeled, shower chair, Shower bench, and Grab bars  PLOF: Independent with household mobility with device, Independent with community mobility with device, and gets help with wife doing tasks mainly due to essential tremor   Enjoys reading, used to teach history  PATIENT GOALS: Wants to find exercises to do   OBJECTIVE:  Note: Objective measures were completed at Evaluation unless otherwise noted.  DIAGNOSTIC FINDINGS: 07/31/23  IMPRESSION: 1. Abbreviated examination performed to comply with the patient's deep brain stimulator lead scanning conditions. 2.  No evidence of an acute intracranial abnormality. 3. Cerebral atrophy, chronic small vessel ischemic disease and chronic lacunar infarcts as described. Of note, a chronic lacunar infarct within the right centrum semiovale is new from the prior brain MRI of 04/24/2021. A chronic microhemorrhage within the right aspect is also new from the prior MRI.  COGNITION: Overall cognitive status: Within functional limits for tasks assessed   SENSATION: Light touch: Impaired  and pt with neuropathy, unable to detect light touch at medial ankles   COORDINATION: Heel to shin: WFL bilat    MUSCLE TONE:  Essential tremor, worse RUE>LUE   POSTURE: rounded shoulders and forward head  LOWER EXTREMITY ROM:    Limited knee extension AROM bilat    LOWER EXTREMITY MMT:    MMT Right Eval Left Eval  Hip flexion 4+ 4+  Hip extension    Hip  abduction 5 5  Hip adduction 4+ 4+  Hip internal rotation    Hip external rotation    Knee flexion 4+ 4+  Knee  extension 4 4  Ankle dorsiflexion 3+ 3+  Ankle plantarflexion    Ankle inversion    Ankle eversion    (Blank rows = not tested)  BED MOBILITY:  Notices no difficulty with bed mobility, has his walker right by his bed.   TRANSFERS: Assistive device utilized: Environmental consultant - 4 wheeled  Sit to stand: SBA and CGA Stand to sit: SBA  Needs BUE support to standing, decr forward lean, wider BOS   GAIT: Gait pattern: step through pattern, decreased stride length, decreased hip/knee flexion- Right, decreased ankle dorsiflexion- Right, Right foot flat, and poor foot clearance- Right Distance walked: Clinic distances  Assistive device utilized: Environmental consultant - 4 wheeled Level of assistance: SBA Comments: Pt with decr R ankle DF, pt wears diabetic shoes   FUNCTIONAL TESTS:  5 times sit to stand: 40.2 seconds with BUE support from chair  Timed up and go (TUG): 22 seconds with rollator 10 meter walk test: 15.6 seconds with rollator =  2.1 ft/sec, limited community ambulator    TODAY'S TREATMENT:                                                                                                                              N/A during eval    PATIENT EDUCATION: Education details: Clinical findings, POC, will trial an AFO in future sessions, discussed potential for speech therapy due speech changes. Pt deferred at this time.  Person educated: Patient and Spouse Education method: Explanation Education comprehension: verbalized understanding  HOME EXERCISE PROGRAM: Will provide at future session   GOALS: Goals reviewed with patient? Yes  SHORT TERM GOALS: Target date: 10/09/2023  Pt will be independent with initial HEP in order to build upon functional gains made in therapy. Baseline: Goal status: INITIAL  2.  BERG to be assessed with LTG written.  Baseline:  Goal status: INITIAL  3.   Pt will trial different R AFOs for improved foot clearance during gait.  Baseline:  Goal status: INITIAL  4.  Pt will improve 5x sit<>stand to less than or equal to 35 sec to demonstrate improved functional strength and transfer efficiency.   Baseline: 40.2 seconds with BUE support from chair  Goal status: INITIAL  5.  Pt will improve gait speed with rollator to at least 2.3 ft/sec in order to demo improved community mobility.  Baseline: 15.6 seconds with rollator =  2.1 ft/sec, limited community ambulator  Goal status: INITIAL    LONG TERM GOALS: Target date: 11/06/2023   Pt will be independent with final HEP in order to build upon functional gains made in therapy. Baseline:  Goal status: INITIAL  2.  BERG goal to be written. Baseline:  Goal status: INITIAL  3.  Pt will improve 5x sit<>stand to less than or equal to 28 sec to demonstrate improved functional strength and transfer efficiency.  Baseline: 40.2 seconds with BUE support from chair  Goal status: INITIAL  4.  Pt will improve  gait speed with rollator to at least 2.6 ft/sec in order to demo improved community mobility.  Baseline:  Goal status: INITIAL  5.  Pt will improve TUG time to 18 seconds or less in order to demo decrease fall risk. Baseline: 22 seconds with rollator Goal status: INITIAL   ASSESSMENT:  CLINICAL IMPRESSION: Patient is a 72 year old male referred to Neuro OPPT for ataxia.   Pt's PMH is significant for: hypertension, type 2 diabetes mellitus, history of PE on Eliquis, OSA on CPAP, CKD 3B, history of CVA (October, 2020 and May, 2022) , neuropathy, and essential tremor with deep brain stimulator . The following deficits were present during the exam: gait abnormalities with chronic R foot drop, impaired balance, decr strength, decr activity tolerance, impaired sensation, essential tremor. Based on 5x sit <> stand, gait speed, and TUG, pt is an incr risk for falls. Pt would benefit from skilled PT to  address these impairments and functional limitations to maximize functional mobility independence and decr fall risk.    OBJECTIVE IMPAIRMENTS: Abnormal gait, cardiopulmonary status limiting activity, decreased activity tolerance, decreased balance, decreased coordination, decreased endurance, decreased mobility, difficulty walking, decreased ROM, decreased strength, impaired sensation, and postural dysfunction.   ACTIVITY LIMITATIONS: carrying, lifting, bending, standing, stairs, transfers, dressing, and locomotion level  PARTICIPATION LIMITATIONS: meal prep, cleaning, laundry, driving, shopping, and community activity  PERSONAL FACTORS: Age, Behavior pattern, Fitness, Past/current experiences, Time since onset of injury/illness/exacerbation, and 3+ comorbidities: hypertension, type 2 diabetes mellitus, history of PE on Eliquis, OSA on CPAP, CKD 3B, history of CVA (October, 2020 and May, 2022) , neuropathy, and essential tremor with deep brain stimulator   are also affecting patient's functional outcome.   REHAB POTENTIAL: Good  CLINICAL DECISION MAKING: Evolving/moderate complexity  EVALUATION COMPLEXITY: Moderate  PLAN:  PT FREQUENCY: 2x/week  PT DURATION: 12 weeks - due to delay in scheduling  PLANNED INTERVENTIONS: 97164- PT Re-evaluation, 97110-Therapeutic exercises, 97530- Therapeutic activity, 97112- Neuromuscular re-education, 97535- Self Care, 16109- Manual therapy, [phone]- Gait training, [phone]- Orthotic Fit/training, Patient/Family education, Balance training, Stair training, Vestibular training, and DME instructions  PLAN FOR NEXT SESSION: perform BERG and write goal, try R AFOs, initiate HEP for strength/balance   Drake Leach, PT, DPT 09/10/2023, 3:18 PM

## 2023-09-15 ENCOUNTER — Ambulatory Visit: Payer: Self-pay | Admitting: Podiatry

## 2023-09-21 ENCOUNTER — Ambulatory Visit: Payer: Medicare PPO | Admitting: Podiatry

## 2023-09-21 ENCOUNTER — Ambulatory Visit: Payer: Medicare PPO

## 2023-09-21 VITALS — BP 135/68 | HR 70

## 2023-09-21 DIAGNOSIS — R2689 Other abnormalities of gait and mobility: Secondary | ICD-10-CM | POA: Diagnosis not present

## 2023-09-21 DIAGNOSIS — R29818 Other symptoms and signs involving the nervous system: Secondary | ICD-10-CM

## 2023-09-21 DIAGNOSIS — M6281 Muscle weakness (generalized): Secondary | ICD-10-CM

## 2023-09-21 DIAGNOSIS — Z9181 History of falling: Secondary | ICD-10-CM | POA: Diagnosis not present

## 2023-09-21 DIAGNOSIS — R2681 Unsteadiness on feet: Secondary | ICD-10-CM | POA: Diagnosis not present

## 2023-09-21 NOTE — Therapy (Signed)
OUTPATIENT PHYSICAL THERAPY NEURO EVALUATION   Patient Name: Joshua [name] MRN: [id number] DOB:11/18/1951, 72 y.o., male Today's Date: 09/21/2023   PCP: Ronney Lion, NP  REFERRING PROVIDER: Ronney Lion, NP   END OF SESSION:  PT End of Session - 09/21/23 1149     Visit Number 2    Number of Visits 17    Date for PT Re-Evaluation 12/09/23   due to delay in scheduling   Authorization Type [employer]    PT Start Time 1150    PT Stop Time 1234    PT Time Calculation (min) 44 min    Equipment Utilized During Treatment Gait belt    Activity Tolerance Patient tolerated treatment well;Patient limited by fatigue    Behavior During Therapy WFL for tasks assessed/performed              Past Medical History:  Diagnosis Date   Benign essential tremor    Benign positional vertigo    Chronic kidney disease 08/28/2019   CVA (cerebral vascular accident) (HCC)    x2 - L retina, 1 right parietal   Degenerative arthritis    Depression    Diabetes mellitus    DVT (deep venous thrombosis) (HCC) 2018   Dyslipidemia    GERD (gastroesophageal reflux disease)    hiatal hernia   Gout    H/O: vasectomy    Hearing aid worn    b/l   Hx of appendectomy    Hx of tonsillectomy    Hypertension    Hypertrophic cardiomyopathy (HCC)    Ischemic optic neuropathy    on the left   Melanoma (HCC)    NSVT (nonsustained ventricular tachycardia) (HCC)    1 4 beat run on event monitor in 07/2020   Obesity    OSA on CPAP    setting = 5   Pulmonary emboli (HCC) 2018   PVC's (premature ventricular contractions)    SVT (supraventricular tachycardia) (HCC)    by event monitor   Tremor, essential 06/22/2017   Wears glasses    Past Surgical History:  Procedure Laterality Date   APPENDECTOMY     arthroscopic knee surgery Bilateral    CATARACT EXTRACTION Bilateral    COLONOSCOPY     MINOR PLACEMENT OF FIDUCIAL N/A 06/30/2019   Procedure: Fiducial placement;  Surgeon: Maeola Harman, MD;  Location: [facility] OR;  Service: Neurosurgery;  Laterality: N/A;  Fiducial placement   NASAL SEPTUM SURGERY     PULSE GENERATOR IMPLANT N/A 07/14/2019   Procedure: Left cranial Implanted Pulse Generator and lead extension placement to right chest ;  Surgeon: Maeola Harman, MD;  Location: [facility] OR;  Service: Neurosurgery;  Laterality: N/A;   SUBTHALAMIC STIMULATOR INSERTION Left 07/07/2019   Procedure: LEFT DEEP BRAIN STIMULATOR PLACEMENT;  Surgeon: Maeola Harman, MD;  Location: [facility] OR;  Service: Neurosurgery;  Laterality: Left;   TEE WITHOUT CARDIOVERSION N/A 04/23/2021   Procedure: TRANSESOPHAGEAL ECHOCARDIOGRAM (TEE);  Surgeon: Elease Hashimoto Deloris Ping, MD;  Location: [facility] ENDOSCOPY;  Service: Cardiovascular;  Laterality: N/A;   TONSILLECTOMY     TOTAL KNEE ARTHROPLASTY Left 03/30/2017   Procedure: LEFT TOTAL KNEE ARTHROPLASTY;  Surgeon: Durene Romans, MD;  Location: [facility] ORS;  Service: Orthopedics;  Laterality: Left;   WISDOM TOOTH EXTRACTION     Patient Active Problem List   Diagnosis Date Noted   Dysarthria 07/30/2023   Other reduced mobility 05/08/2023   Ataxia 08/05/2021   Confusion 08/05/2021   Dystrophia unguium 08/05/2021   Encounter for fitting and  adjustment of hearing aid 08/05/2021   Foot ulcer (HCC) 08/05/2021   Acute respiratory failure with hypoxia (HCC) 08/05/2021   Edema 08/05/2021   Neuropathy 08/05/2021   Pericardial effusion 08/05/2021   Staphylococcal infectious disease 08/05/2021   Unilateral primary osteoarthritis, right knee 08/05/2021   Cerebral thrombosis with cerebral infarction 04/24/2021   Bacteremia, coagulase-negative staphylococcal 04/21/2021   History of pulmonary embolism 04/21/2021   History of stroke 04/21/2021   DOE (dyspnea on exertion) 01/30/2021   Acute hypoxemic respiratory failure (HCC) 01/29/2021   Bilateral hearing loss 05/15/2020   Bilateral impacted cerumen 05/15/2020   Fungal otitis externa 05/15/2020   Impacted cerumen 05/14/2020   Hearing  loss 03/27/2020   Melanocytic nevi, unspecified 03/27/2020   Aphasia 09/01/2019   Cardiomegaly 09/01/2019   Dysphagia 09/01/2019   Cerebral embolism with cerebral infarction 08/29/2019   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HCC) 08/28/2019   Hyperlipidemia 08/28/2019   Chronic kidney disease 08/28/2019   Obesity (BMI 30-39.9) 08/28/2019   Presence of other specified devices 07/28/2019   Tremor 07/07/2019   Cough 01/13/2019   Lymphadenopathy 12/31/2018   Abnormal findings on diagnostic imaging of other specified body structures 06/18/2018   Noncompliance with treatment 03/25/2018   Epistaxis 01/14/2018   Essential tremor 06/22/2017   Fatigue 06/19/2017   Hardening of the aorta (main artery of the heart) (HCC) 04/15/2017   Long term (current) use of anticoagulants 04/14/2017   OSA on CPAP 04/10/2017   Essential hypertension 04/10/2017   Pulmonary thromboembolism (HCC) 04/10/2017   PE (pulmonary thromboembolism) (HCC) 04/09/2017   Orthostatic hypotension 04/09/2017   S/P left TKA 03/30/2017   S/P total knee replacement 03/30/2017   Electrocardiogram abnormal 07/29/2016   Constipation 05/26/2016   Varicose veins of lower extremity 05/26/2016   Cellulitis of left foot 03/07/2016   Pre-ulcerative calluses 03/07/2016   Type 2 diabetes mellitus with left diabetic foot ulcer (HCC) 01/14/2016   Diabetic peripheral neuropathy associated with type 2 diabetes mellitus (HCC) 10/19/2015   Plantar wart 08/08/2015   Encounter for general adult medical examination without abnormal findings 05/17/2015   CKD stage 3b, GFR 30-44 ml/min (HCC) 10/21/2013   Late effects of cerebrovascular disease 10/21/2013   Irritability and anger 03/04/2013   Major depression, single episode, in complete remission (HCC) 03/04/2013   Panic disorder 03/04/2013   Disequilibrium 04/17/2011   Allergic rhinitis 01/23/2011   Diabetic renal disease (HCC) 11/29/2009   Gout 09/20/2009   Osteoarthritis 09/20/2009    Proteinuria 09/20/2009   Ventricular premature depolarization 09/20/2009   Diabetes mellitus type 2 in obese (HCC) 08/07/2008   GERD 08/07/2008    ONSET DATE: 08/20/2023  REFERRING DIAG: R27.0 (ICD-10-CM) - Ataxia, unspecified   THERAPY DIAG:  Unsteadiness on feet  Other abnormalities of gait and mobility  Muscle weakness (generalized)  History of falling  Other symptoms and signs involving the nervous system  Rationale for Evaluation and Treatment: Rehabilitation  SUBJECTIVE:  SUBJECTIVE STATEMENT:  "I feel good but I have absolutely no energy", reports having slept well last night but does not feel like he has energy for activities. Reports no sickness or fever. Denies falls or acute changes.   Enjoys reading (history, Jonette Pesa), yard work (limited to only mowing on rider), was a HS history Secretary/administrator.   Pt accompanied by:  Wife, Larita Fife   in lobby  PERTINENT HISTORY: 72 y.o. male with medical history significant for hypertension, type 2 diabetes mellitus, history of PE on Eliquis, OSA on CPAP, CKD 3B, history of CVA (October, 2020 and May, 2022) , neuropathy, and essential tremor with deep brain stimulator who presents with acute onset speech difficulty.   - No acute findings on head CT   - MRI reviewed. No new infarct but new chronic R lacunar infarct compared to MRI in 2022  PAIN:  Are you having pain? No  Vitals:   09/21/23 1155  BP: 135/68  Pulse: 70  SpO2: 97%      PRECAUTIONS: Fall and Other: DBS for essential tremor   RED FLAGS: None    FALLS: Has patient fallen in last 6 months? Yes. Number of falls 3 (pre rollator), reports that he just lost his balance   LIVING ENVIRONMENT: Lives with: lives with their spouse Lives in: House/apartment - 1  story Stairs: Yes: External: 3 steps; can reach both and steps are shorter and wider , reports [facility] but is not there yet  Has following equipment at home: Environmental consultant - 4 wheeled, shower chair, Shower bench, and Grab bars  PLOF: Independent with household mobility with device, Independent with community mobility with device, and gets help with wife doing tasks mainly due to essential tremor   Enjoys reading, used to teach history  PATIENT GOALS: Wants to find exercises to do   OBJECTIVE:  Note: Objective measures were completed at Evaluation unless otherwise noted.  DIAGNOSTIC FINDINGS: 07/31/23  IMPRESSION: 1. Abbreviated examination performed to comply with the patient's deep brain stimulator lead scanning conditions. 2.  No evidence of an acute intracranial abnormality. 3. Cerebral atrophy, chronic small vessel ischemic disease and chronic lacunar infarcts as described. Of note, a chronic lacunar infarct within the right centrum semiovale is new from the prior brain MRI of 04/24/2021. A chronic microhemorrhage within the right aspect is also new from the prior MRI.  COGNITION: Overall cognitive status: Within functional limits for tasks assessed   SENSATION: Light touch: Impaired  and pt with neuropathy, unable to detect light touch at medial ankles   COORDINATION: Heel to shin: WFL bilat    MUSCLE TONE:  Essential tremor, worse RUE>LUE   POSTURE: rounded shoulders and forward head  LOWER EXTREMITY ROM:    Limited knee extension AROM bilat    LOWER EXTREMITY MMT:    MMT Right Eval Left Eval  Hip flexion 4+ 4+  Hip extension    Hip abduction 5 5  Hip adduction 4+ 4+  Hip internal rotation    Hip external rotation    Knee flexion 4+ 4+  Knee extension 4 4  Ankle dorsiflexion 3+ 3+  Ankle plantarflexion    Ankle inversion    Ankle eversion    (Blank rows = not tested)  BED MOBILITY:  Notices no difficulty with bed mobility, has his walker  right by his bed.   TRANSFERS: Assistive device utilized: Walker - 4 wheeled  Sit to stand: SBA and CGA Stand to  sit: SBA  Needs BUE support to standing, decr forward lean, wider BOS   GAIT: Gait pattern: step through pattern, decreased stride length, decreased hip/knee flexion- Right, decreased ankle dorsiflexion- Right, Right foot flat, and poor foot clearance- Right Distance walked: Clinic distances  Assistive device utilized: Environmental consultant - 4 wheeled Level of assistance: SBA Comments: Pt with decr R ankle DF, pt wears diabetic shoes   FUNCTIONAL TESTS:  5 times sit to stand: 40.2 seconds with BUE support from chair  Timed up and go (TUG): 22 seconds with rollator 10 meter walk test: 15.6 seconds with rollator =  2.1 ft/sec, limited community ambulator    TODAY'S TREATMENT:                                                                                                                               TherAct Today's Vitals   09/21/23 1155  BP: 135/68  Pulse: 70  SpO2: 97%   Berg balance  [facility] PT Assessment - 09/21/23 1200       Standardized Balance Assessment   Standardized Balance Assessment Berg Balance Test      Berg Balance Test   Sit to Stand Able to stand  independently using hands    Standing Unsupported Able to stand safely 2 minutes    Sitting with Back Unsupported but Feet Supported on Floor or Stool Able to sit safely and securely 2 minutes    Stand to Sit Controls descent by using hands    Transfers Able to transfer safely, definite need of hands    Standing Unsupported with Eyes Closed Able to stand 10 seconds with supervision    Standing Unsupported with Feet Together Able to place feet together independently and stand for 1 minute with supervision    From Standing, Reach Forward with Outstretched Arm Can reach forward >5 cm safely (2")    From Standing Position, Pick up Object from Floor Able to pick up shoe, needs supervision    From Standing Position, Turn  to Look Behind Over each Shoulder Looks behind from both sides and weight shifts well    Turn 360 Degrees Able to turn 360 degrees safely but slowly    Standing Unsupported, Alternately Place Feet on Step/Stool Able to stand independently and complete 8 steps >20 seconds    Standing Unsupported, One Foot in Front Able to plae foot ahead of the other independently and hold 30 seconds   step w/ LLE   Standing on One Leg Tries to lift leg/unable to hold 3 seconds but remains standing independently    Total Score 41    Berg comment: 41/56, significant risk for falls           Pt requires several seated rest breaks and a ginger ale to complete Berg.   TherEx Supine Bent Knee Fall Out x5 reps bilaterally Cues for slow and controlled motion as well as only moving to the point of a tolerable  stretch. Pt feels like he should be able to move his RLE further but is limited by body, stretching sensation on L only Supine Marching x5 reps bilaterally Both added to HEP, see bolded below   PATIENT EDUCATION:  Education details: Outcome measure findings, will trial an AFO in next session, HEP Person educated: Patient Education method: Explanation, Demonstration, and Handouts Education comprehension: verbalized understanding, returned demonstration, and needs further education  HOME EXERCISE PROGRAM:  Access Code: [email or web address] URL: [email or web address] Date: 09/21/2023 Prepared by: Beverely Low  Exercises - Bent Knee Fallouts with Alternating Legs  - 1 x daily - 7 x weekly - 3 sets - 10 reps - Supine March  - 1 x daily - 7 x weekly - 3 sets - 10 reps  GOALS: Goals reviewed with patient? Yes  SHORT TERM GOALS: Target date: 10/09/2023   Pt will be independent with initial HEP in order to build upon functional gains made in therapy. Baseline: Goal status: INITIAL  2.  Pt will improve Berg score to 45/56 for decreased fall risk.  Baseline: 41/56 (09/21/23) Goal status:  INITIAL  3.  Pt will trial different R AFOs for improved foot clearance during gait.  Baseline:  Goal status: INITIAL  4.  Pt will improve 5x sit<>stand to less than or equal to 35 sec to demonstrate improved functional strength and transfer efficiency.   Baseline: 40.2 seconds with BUE support from chair  Goal status: INITIAL  5.  Pt will improve gait speed with rollator to at least 2.3 ft/sec in order to demo improved community mobility.  Baseline: 15.6 seconds with rollator =  2.1 ft/sec, limited community ambulator  Goal status: INITIAL    LONG TERM GOALS: Target date: 11/06/2023    Pt will be independent with final HEP in order to build upon functional gains made in therapy. Baseline:  Goal status: INITIAL  2.  Pt will improve Berg score to 49/56 for decreased fall risk.  Baseline: 41/56 (09/21/23) Goal status: INITIAL  3.  Pt will improve 5x sit<>stand to less than or equal to 28 sec to demonstrate improved functional strength and transfer efficiency.  Baseline: 40.2 seconds with BUE support from chair  Goal status: INITIAL  4.  Pt will improve gait speed with rollator to at least 2.6 ft/sec in order to demo improved community mobility.  Baseline:  Goal status: INITIAL  5.  Pt will improve TUG time to 18 seconds or less in order to demo decrease fall risk. Baseline: 22 seconds with rollator Goal status: INITIAL   ASSESSMENT:  CLINICAL IMPRESSION:  Emphasis of skilled PT session on assessing standing balance and initiating HEP. Based on Berg score of 41/56, pt is an incr risk for falls, with specific deficits in reaching out of BOS, turning 360, narrow and single leg stance. Pt limited by fatigue this date, requiring several seated rest breaks and a ginger ale. Pt tolerated supine HEP exercises well, needs 2 pillows under head to lay supine. Continue POC.    OBJECTIVE IMPAIRMENTS: Abnormal gait, cardiopulmonary status limiting activity, decreased activity  tolerance, decreased balance, decreased coordination, decreased endurance, decreased mobility, difficulty walking, decreased ROM, decreased strength, impaired sensation, and postural dysfunction.   ACTIVITY LIMITATIONS: carrying, lifting, bending, standing, stairs, transfers, dressing, and locomotion level  PARTICIPATION LIMITATIONS: meal prep, cleaning, laundry, driving, shopping, and community activity  PERSONAL FACTORS: Age, Behavior pattern, Fitness, Past/current experiences, Time since onset of injury/illness/exacerbation, and 3+ comorbidities: hypertension, type 2 diabetes mellitus, history of PE on Eliquis,  OSA on CPAP, CKD 3B, history of CVA (October, 2020 and May, 2022) , neuropathy, and essential tremor with deep brain stimulator   are also affecting patient's functional outcome.   REHAB POTENTIAL: Good  CLINICAL DECISION MAKING: Evolving/moderate complexity  EVALUATION COMPLEXITY: Moderate  PLAN:  PT FREQUENCY: 2x/week  PT DURATION: 12 weeks - due to delay in scheduling  PLANNED INTERVENTIONS: 97164- PT Re-evaluation, 97110-Therapeutic exercises, 97530- Therapeutic activity, 97112- Neuromuscular re-education, 97535- Self Care, 78295- Manual therapy, [phone]- Gait training, [phone]- Orthotic Fit/training, Patient/Family education, Balance training, Stair training, Vestibular training, and DME instructions  PLAN FOR NEXT SESSION: try R AFOs, Add to HEP for strengthening and balance as appropriate, posterior pelvic tilts, STS from elevated surface, clams, heel raises *pt w/ difficulty holding objects in either hand, does not want to walk with cane for this reason   Beverely Low, Student-PT 09/21/2023, 12:55 PM

## 2023-09-23 ENCOUNTER — Ambulatory Visit: Payer: Medicare PPO

## 2023-09-23 ENCOUNTER — Ambulatory Visit: Payer: Medicare PPO | Admitting: Urology

## 2023-09-23 ENCOUNTER — Encounter: Payer: Self-pay | Admitting: Urology

## 2023-09-23 VITALS — BP 117/73 | HR 106 | Ht 76.0 in | Wt 239.0 lb

## 2023-09-23 DIAGNOSIS — D0439 Carcinoma in situ of skin of other parts of face: Secondary | ICD-10-CM | POA: Diagnosis not present

## 2023-09-23 DIAGNOSIS — R350 Frequency of micturition: Secondary | ICD-10-CM | POA: Diagnosis not present

## 2023-09-23 DIAGNOSIS — D485 Neoplasm of uncertain behavior of skin: Secondary | ICD-10-CM | POA: Diagnosis not present

## 2023-09-23 DIAGNOSIS — R3129 Other microscopic hematuria: Secondary | ICD-10-CM

## 2023-09-23 DIAGNOSIS — Z85828 Personal history of other malignant neoplasm of skin: Secondary | ICD-10-CM | POA: Diagnosis not present

## 2023-09-23 DIAGNOSIS — N1831 Chronic kidney disease, stage 3a: Secondary | ICD-10-CM

## 2023-09-23 DIAGNOSIS — R339 Retention of urine, unspecified: Secondary | ICD-10-CM | POA: Diagnosis not present

## 2023-09-23 DIAGNOSIS — N401 Enlarged prostate with lower urinary tract symptoms: Secondary | ICD-10-CM | POA: Diagnosis not present

## 2023-09-23 DIAGNOSIS — G4739 Other sleep apnea: Secondary | ICD-10-CM | POA: Diagnosis not present

## 2023-09-23 DIAGNOSIS — L570 Actinic keratosis: Secondary | ICD-10-CM | POA: Diagnosis not present

## 2023-09-23 DIAGNOSIS — Z8673 Personal history of transient ischemic attack (TIA), and cerebral infarction without residual deficits: Secondary | ICD-10-CM

## 2023-09-23 DIAGNOSIS — L905 Scar conditions and fibrosis of skin: Secondary | ICD-10-CM | POA: Diagnosis not present

## 2023-09-23 LAB — BLADDER SCAN AMB NON-IMAGING: Scan Result: 146

## 2023-09-23 MED ORDER — TAMSULOSIN HCL 0.4 MG PO CAPS
0.4000 mg | ORAL_CAPSULE | Freq: Every day | ORAL | 11 refills | Status: DC
Start: 2023-09-23 — End: 2024-01-20

## 2023-09-23 NOTE — Patient Instructions (Signed)

## 2023-09-24 LAB — URINALYSIS, COMPLETE
Bilirubin, UA: NEGATIVE
Ketones, UA: NEGATIVE
Leukocytes,UA: NEGATIVE
Nitrite, UA: NEGATIVE
Specific Gravity, UA: 1.02 (ref 1.005–1.030)
Urobilinogen, Ur: 0.2 mg/dL (ref 0.2–1.0)
pH, UA: 7 (ref 5.0–7.5)

## 2023-09-24 LAB — MICROSCOPIC EXAMINATION

## 2023-09-24 LAB — PSA: Prostate Specific Ag, Serum: 1.5 ng/mL (ref 0.0–4.0)

## 2023-09-27 ENCOUNTER — Other Ambulatory Visit: Payer: Self-pay | Admitting: Cardiology

## 2023-09-28 ENCOUNTER — Ambulatory Visit: Payer: Medicare PPO | Admitting: Physical Therapy

## 2023-09-29 ENCOUNTER — Ambulatory Visit: Payer: Medicare PPO | Attending: Internal Medicine

## 2023-09-29 ENCOUNTER — Telehealth: Payer: Self-pay

## 2023-09-29 VITALS — BP 123/66 | HR 70

## 2023-09-29 DIAGNOSIS — R2681 Unsteadiness on feet: Secondary | ICD-10-CM | POA: Diagnosis not present

## 2023-09-29 DIAGNOSIS — Z9181 History of falling: Secondary | ICD-10-CM | POA: Insufficient documentation

## 2023-09-29 DIAGNOSIS — R2689 Other abnormalities of gait and mobility: Secondary | ICD-10-CM | POA: Insufficient documentation

## 2023-09-29 DIAGNOSIS — M6281 Muscle weakness (generalized): Secondary | ICD-10-CM | POA: Insufficient documentation

## 2023-09-29 NOTE — Telephone Encounter (Signed)
Dr. Timothy Lasso,  Joshua [name] is being treated by physical therapy for gait deficits s/p CVA.  He will benefit from use of Bilateral AFOs in order to improve safety with functional mobility.  If you could addend your most recent encounter with the patient to mention medical necessity for AFOs to serve as a face-to-face for insurance purposes.   If you agree, please submit request in EPIC under MD Order, Other Orders (list bilateral AFOs in comments) or fax to [facility] at [phone].   Thank you, Westley Foots, PT, DPT, [facility] [address] [place], [place]  [address] Phone:  [phone] Fax:  [phone]

## 2023-09-29 NOTE — Therapy (Signed)
OUTPATIENT PHYSICAL THERAPY NEURO EVALUATION   Patient Name: Joshua [name] MRN: [id number] DOB:1951-03-07, 72 y.o., male Today's Date: 09/29/2023   PCP: Ronney Lion, NP  REFERRING PROVIDER: Ronney Lion, NP   END OF SESSION:  PT End of Session - 09/29/23 1101     Visit Number 3    Number of Visits 17    Date for PT Re-Evaluation 12/09/23    Authorization Type [employer]    PT Start Time 1102    PT Stop Time 1145    PT Time Calculation (min) 43 min    Equipment Utilized During Treatment Gait belt    Activity Tolerance Patient tolerated treatment well    Behavior During Therapy WFL for tasks assessed/performed              Past Medical History:  Diagnosis Date   Benign essential tremor    Benign positional vertigo    Chronic kidney disease 08/28/2019   CVA (cerebral vascular accident) (HCC)    x2 - L retina, 1 right parietal   Degenerative arthritis    Depression    Diabetes mellitus    DVT (deep venous thrombosis) (HCC) 2018   Dyslipidemia    GERD (gastroesophageal reflux disease)    hiatal hernia   Gout    H/O: vasectomy    Hearing aid worn    b/l   Hx of appendectomy    Hx of tonsillectomy    Hypertension    Hypertrophic cardiomyopathy (HCC)    Ischemic optic neuropathy    on the left   Melanoma (HCC)    NSVT (nonsustained ventricular tachycardia) (HCC)    1 4 beat run on event monitor in 07/2020   Obesity    OSA on CPAP    setting = 5   Pulmonary emboli (HCC) 2018   PVC's (premature ventricular contractions)    SVT (supraventricular tachycardia) (HCC)    by event monitor   Tremor, essential 06/22/2017   Wears glasses    Past Surgical History:  Procedure Laterality Date   APPENDECTOMY     arthroscopic knee surgery Bilateral    CATARACT EXTRACTION Bilateral    COLONOSCOPY     MINOR PLACEMENT OF FIDUCIAL N/A 06/30/2019   Procedure: Fiducial placement;  Surgeon: Maeola Harman, MD;  Location: [facility] OR;  Service: Neurosurgery;   Laterality: N/A;  Fiducial placement   NASAL SEPTUM SURGERY     PULSE GENERATOR IMPLANT N/A 07/14/2019   Procedure: Left cranial Implanted Pulse Generator and lead extension placement to right chest ;  Surgeon: Maeola Harman, MD;  Location: [facility] OR;  Service: Neurosurgery;  Laterality: N/A;   SUBTHALAMIC STIMULATOR INSERTION Left 07/07/2019   Procedure: LEFT DEEP BRAIN STIMULATOR PLACEMENT;  Surgeon: Maeola Harman, MD;  Location: [facility] OR;  Service: Neurosurgery;  Laterality: Left;   TEE WITHOUT CARDIOVERSION N/A 04/23/2021   Procedure: TRANSESOPHAGEAL ECHOCARDIOGRAM (TEE);  Surgeon: Elease Hashimoto Deloris Ping, MD;  Location: [facility] ENDOSCOPY;  Service: Cardiovascular;  Laterality: N/A;   TONSILLECTOMY     TOTAL KNEE ARTHROPLASTY Left 03/30/2017   Procedure: LEFT TOTAL KNEE ARTHROPLASTY;  Surgeon: Durene Romans, MD;  Location: [facility] ORS;  Service: Orthopedics;  Laterality: Left;   WISDOM TOOTH EXTRACTION     Patient Active Problem List   Diagnosis Date Noted   Dysarthria 07/30/2023   Other reduced mobility 05/08/2023   Ataxia 08/05/2021   Confusion 08/05/2021   Dystrophia unguium 08/05/2021   Encounter for fitting and adjustment of hearing aid 08/05/2021   Foot ulcer (  HCC) 08/05/2021   Acute respiratory failure with hypoxia (HCC) 08/05/2021   Edema 08/05/2021   Neuropathy 08/05/2021   Pericardial effusion 08/05/2021   Staphylococcal infectious disease 08/05/2021   Unilateral primary osteoarthritis, right knee 08/05/2021   Cerebral thrombosis with cerebral infarction 04/24/2021   Bacteremia, coagulase-negative staphylococcal 04/21/2021   History of pulmonary embolism 04/21/2021   History of stroke 04/21/2021   DOE (dyspnea on exertion) 01/30/2021   Acute hypoxemic respiratory failure (HCC) 01/29/2021   Bilateral hearing loss 05/15/2020   Bilateral impacted cerumen 05/15/2020   Fungal otitis externa 05/15/2020   Impacted cerumen 05/14/2020   Hearing loss 03/27/2020   Melanocytic nevi, unspecified  03/27/2020   Aphasia 09/01/2019   Cardiomegaly 09/01/2019   Dysphagia 09/01/2019   Cerebral embolism with cerebral infarction 08/29/2019   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HCC) 08/28/2019   Hyperlipidemia 08/28/2019   Chronic kidney disease 08/28/2019   Obesity (BMI 30-39.9) 08/28/2019   Presence of other specified devices 07/28/2019   Tremor 07/07/2019   Cough 01/13/2019   Lymphadenopathy 12/31/2018   Abnormal findings on diagnostic imaging of other specified body structures 06/18/2018   Noncompliance with treatment 03/25/2018   Epistaxis 01/14/2018   Essential tremor 06/22/2017   Fatigue 06/19/2017   Hardening of the aorta (main artery of the heart) (HCC) 04/15/2017   Long term (current) use of anticoagulants 04/14/2017   OSA on CPAP 04/10/2017   Essential hypertension 04/10/2017   Pulmonary thromboembolism (HCC) 04/10/2017   PE (pulmonary thromboembolism) (HCC) 04/09/2017   Orthostatic hypotension 04/09/2017   S/P left TKA 03/30/2017   S/P total knee replacement 03/30/2017   Electrocardiogram abnormal 07/29/2016   Constipation 05/26/2016   Varicose veins of lower extremity 05/26/2016   Cellulitis of left foot 03/07/2016   Pre-ulcerative calluses 03/07/2016   Type 2 diabetes mellitus with left diabetic foot ulcer (HCC) 01/14/2016   Diabetic peripheral neuropathy associated with type 2 diabetes mellitus (HCC) 10/19/2015   Plantar wart 08/08/2015   Encounter for general adult medical examination without abnormal findings 05/17/2015   CKD stage 3b, GFR 30-44 ml/min (HCC) 10/21/2013   Late effects of cerebrovascular disease 10/21/2013   Irritability and anger 03/04/2013   Major depression, single episode, in complete remission (HCC) 03/04/2013   Panic disorder 03/04/2013   Disequilibrium 04/17/2011   Allergic rhinitis 01/23/2011   Diabetic renal disease (HCC) 11/29/2009   Gout 09/20/2009   Osteoarthritis 09/20/2009   Proteinuria 09/20/2009   Ventricular premature  depolarization 09/20/2009   Diabetes mellitus type 2 in obese (HCC) 08/07/2008   GERD 08/07/2008    ONSET DATE: 08/20/2023  REFERRING DIAG: R27.0 (ICD-10-CM) - Ataxia, unspecified   THERAPY DIAG:  Unsteadiness on feet  Other abnormalities of gait and mobility  Muscle weakness (generalized)  Rationale for Evaluation and Treatment: Rehabilitation  SUBJECTIVE:  SUBJECTIVE STATEMENT:  Patient arrives to clinic with rollator. Started a new rx for his bladder, tolerates well. Continues to report reduced energy levels. Denies falls.  Pt accompanied by:  Wife, Larita Fife   in lobby  PERTINENT HISTORY: 72 y.o. male with medical history significant for hypertension, type 2 diabetes mellitus, history of PE on Eliquis, OSA on CPAP, CKD 3B, history of CVA (October, 2020 and May, 2022) , neuropathy, and essential tremor with deep brain stimulator who presents with acute onset speech difficulty.   - No acute findings on head CT   - MRI reviewed. No new infarct but new chronic R lacunar infarct compared to MRI in 2022  PAIN:  Are you having pain? No  Vitals:   09/29/23 1112  BP: 123/66  Pulse: 70       PRECAUTIONS: Fall and Other: DBS for essential tremor   Enjoys reading, used to teach history  PATIENT GOALS: Wants to find exercises to do   OBJECTIVE:  Note: Objective measures were completed at Evaluation unless otherwise noted.  DIAGNOSTIC FINDINGS: 07/31/23  IMPRESSION: 1. Abbreviated examination performed to comply with the patient's deep brain stimulator lead scanning conditions. 2.  No evidence of an acute intracranial abnormality. 3. Cerebral atrophy, chronic small vessel ischemic disease and chronic lacunar infarcts as described. Of note, a chronic lacunar infarct within the right centrum  semiovale is new from the prior brain MRI of 04/24/2021. A chronic microhemorrhage within the right aspect is also new from the prior MRI.   TODAY'S TREATMENT:                                                                                                                               TherAct Today's Vitals   09/29/23 1112  BP: 123/66  Pulse: 70  Trial B AFOs   -anterior guard thusane spry step   -pacing 80ft distance with rollator   -115' rollator + B AFOs and close supervision   PATIENT EDUCATION:  Education details: process to obtain AFOs, walking program Person educated: Patient Education method: Explanation, Demonstration, and Handouts Education comprehension: verbalized understanding, returned demonstration, and needs further education  HOME EXERCISE PROGRAM:  Access Code: [email or web address] URL: [email or web address] Date: 09/21/2023 Prepared by: Beverely Low  Exercises - Bent Knee Fallouts with Alternating Legs  - 1 x daily - 7 x weekly - 3 sets - 10 reps - Supine March  - 1 x daily - 7 x weekly - 3 sets - 10 reps  You Can Walk For A Certain Length Of Time Each Day                          Walk 1.5 minutes 3 times per day.             Increase 1  minutes every 2-3 days (or when previous time feels easy)  Work up to 20 minutes (1-2 times per day).    GOALS: Goals reviewed with patient? Yes  SHORT TERM GOALS: Target date: 10/09/2023   Pt will be independent with initial HEP in order to build upon functional gains made in therapy. Baseline: Goal status: INITIAL  2.  Pt will improve Berg score to 45/56 for decreased fall risk.  Baseline: 41/56 (09/21/23) Goal status: INITIAL  3.  Pt will trial different R AFOs for improved foot clearance during gait.  Baseline:  Goal status: INITIAL  4.  Pt will improve 5x sit<>stand to less than or equal to 35 sec to demonstrate improved functional strength and transfer efficiency.   Baseline: 40.2  seconds with BUE support from chair  Goal status: INITIAL  5.  Pt will improve gait speed with rollator to at least 2.3 ft/sec in order to demo improved community mobility.  Baseline: 15.6 seconds with rollator =  2.1 ft/sec, limited community ambulator  Goal status: INITIAL    LONG TERM GOALS: Target date: 11/06/2023    Pt will be independent with final HEP in order to build upon functional gains made in therapy. Baseline:  Goal status: INITIAL  2.  Pt will improve Berg score to 49/56 for decreased fall risk.  Baseline: 41/56 (09/21/23) Goal status: INITIAL  3.  Pt will improve 5x sit<>stand to less than or equal to 28 sec to demonstrate improved functional strength and transfer efficiency.  Baseline: 40.2 seconds with BUE support from chair  Goal status: INITIAL  4.  Pt will improve gait speed with rollator to at least 2.6 ft/sec in order to demo improved community mobility.  Baseline:  Goal status: INITIAL  5.  Pt will improve TUG time to 18 seconds or less in order to demo decrease fall risk. Baseline: 22 seconds with rollator Goal status: INITIAL   ASSESSMENT:  CLINICAL IMPRESSION:  Patient seen for skilled PT session with emphasis on trialing AFOs. Improved heel contact and foot clearance noted, especially in R LE. Does still maintain slight ataxic gait pattern to RLE. Discussed energy conservation techniques involving use of AFOs for improved efficiency of gait. Discussed importance of skin checks with use of AFOS- patient verbalized understanding. Continue POC.    OBJECTIVE IMPAIRMENTS: Abnormal gait, cardiopulmonary status limiting activity, decreased activity tolerance, decreased balance, decreased coordination, decreased endurance, decreased mobility, difficulty walking, decreased ROM, decreased strength, impaired sensation, and postural dysfunction.   ACTIVITY LIMITATIONS: carrying, lifting, bending, standing, stairs, transfers, dressing, and locomotion  level  PARTICIPATION LIMITATIONS: meal prep, cleaning, laundry, driving, shopping, and community activity  PERSONAL FACTORS: Age, Behavior pattern, Fitness, Past/current experiences, Time since onset of injury/illness/exacerbation, and 3+ comorbidities: hypertension, type 2 diabetes mellitus, history of PE on Eliquis, OSA on CPAP, CKD 3B, history of CVA (October, 2020 and May, 2022) , neuropathy, and essential tremor with deep brain stimulator   are also affecting patient's functional outcome.   REHAB POTENTIAL: Good  CLINICAL DECISION MAKING: Evolving/moderate complexity  EVALUATION COMPLEXITY: Moderate  PLAN:  PT FREQUENCY: 2x/week  PT DURATION: 12 weeks - due to delay in scheduling  PLANNED INTERVENTIONS: 97164- PT Re-evaluation, 97110-Therapeutic exercises, 97530- Therapeutic activity, 97112- Neuromuscular re-education, 97535- Self Care, [id number]- Manual therapy, [phone]- Gait training, [phone]- Orthotic Fit/training, Patient/Family education, Balance training, Stair training, Vestibular training, and DME instructions  PLAN FOR NEXT SESSION: Add to HEP for strengthening and balance as appropriate, posterior pelvic tilts, STS from elevated surface, clams, heel raises, gait with B AFOs (thusane spry step due to  size limitations) *pt w/ difficulty holding objects in either hand, does not want to walk with cane for this reason   Westley Foots, PT Westley Foots, PT, DPT, CBIS  09/29/2023, 12:03 PM

## 2023-10-01 ENCOUNTER — Ambulatory Visit: Payer: Medicare PPO

## 2023-10-01 VITALS — HR 68

## 2023-10-01 DIAGNOSIS — R2681 Unsteadiness on feet: Secondary | ICD-10-CM | POA: Diagnosis not present

## 2023-10-01 DIAGNOSIS — R2689 Other abnormalities of gait and mobility: Secondary | ICD-10-CM | POA: Diagnosis not present

## 2023-10-01 DIAGNOSIS — M6281 Muscle weakness (generalized): Secondary | ICD-10-CM

## 2023-10-01 DIAGNOSIS — Z9181 History of falling: Secondary | ICD-10-CM

## 2023-10-01 NOTE — Therapy (Signed)
OUTPATIENT PHYSICAL THERAPY NEURO EVALUATION   Patient Name: Joshua [name] MRN: [id number] DOB:01-Apr-1951, 72 y.o., male Today's Date: 10/01/2023   PCP: Ronney Lion, NP  REFERRING PROVIDER: Ronney Lion, NP   END OF SESSION:  PT End of Session - 10/01/23 1104     Visit Number 4    Number of Visits 17    Date for PT Re-Evaluation 12/09/23    Authorization Type [employer]    PT Start Time 1102    PT Stop Time 1143    PT Time Calculation (min) 41 min    Equipment Utilized During Treatment Gait belt    Activity Tolerance Patient tolerated treatment well    Behavior During Therapy WFL for tasks assessed/performed              Past Medical History:  Diagnosis Date   Benign essential tremor    Benign positional vertigo    Chronic kidney disease 08/28/2019   CVA (cerebral vascular accident) (HCC)    x2 - L retina, 1 right parietal   Degenerative arthritis    Depression    Diabetes mellitus    DVT (deep venous thrombosis) (HCC) 2018   Dyslipidemia    GERD (gastroesophageal reflux disease)    hiatal hernia   Gout    H/O: vasectomy    Hearing aid worn    b/l   Hx of appendectomy    Hx of tonsillectomy    Hypertension    Hypertrophic cardiomyopathy (HCC)    Ischemic optic neuropathy    on the left   Melanoma (HCC)    NSVT (nonsustained ventricular tachycardia) (HCC)    1 4 beat run on event monitor in 07/2020   Obesity    OSA on CPAP    setting = 5   Pulmonary emboli (HCC) 2018   PVC's (premature ventricular contractions)    SVT (supraventricular tachycardia) (HCC)    by event monitor   Tremor, essential 06/22/2017   Wears glasses    Past Surgical History:  Procedure Laterality Date   APPENDECTOMY     arthroscopic knee surgery Bilateral    CATARACT EXTRACTION Bilateral    COLONOSCOPY     MINOR PLACEMENT OF FIDUCIAL N/A 06/30/2019   Procedure: Fiducial placement;  Surgeon: Maeola Harman, MD;  Location: [facility] OR;  Service: Neurosurgery;   Laterality: N/A;  Fiducial placement   NASAL SEPTUM SURGERY     PULSE GENERATOR IMPLANT N/A 07/14/2019   Procedure: Left cranial Implanted Pulse Generator and lead extension placement to right chest ;  Surgeon: Maeola Harman, MD;  Location: [facility] OR;  Service: Neurosurgery;  Laterality: N/A;   SUBTHALAMIC STIMULATOR INSERTION Left 07/07/2019   Procedure: LEFT DEEP BRAIN STIMULATOR PLACEMENT;  Surgeon: Maeola Harman, MD;  Location: [facility] OR;  Service: Neurosurgery;  Laterality: Left;   TEE WITHOUT CARDIOVERSION N/A 04/23/2021   Procedure: TRANSESOPHAGEAL ECHOCARDIOGRAM (TEE);  Surgeon: Elease Hashimoto Deloris Ping, MD;  Location: [facility] ENDOSCOPY;  Service: Cardiovascular;  Laterality: N/A;   TONSILLECTOMY     TOTAL KNEE ARTHROPLASTY Left 03/30/2017   Procedure: LEFT TOTAL KNEE ARTHROPLASTY;  Surgeon: Durene Romans, MD;  Location: [facility] ORS;  Service: Orthopedics;  Laterality: Left;   WISDOM TOOTH EXTRACTION     Patient Active Problem List   Diagnosis Date Noted   Dysarthria 07/30/2023   Other reduced mobility 05/08/2023   Ataxia 08/05/2021   Confusion 08/05/2021   Dystrophia unguium 08/05/2021   Encounter for fitting and adjustment of hearing aid 08/05/2021   Foot ulcer (  HCC) 08/05/2021   Acute respiratory failure with hypoxia (HCC) 08/05/2021   Edema 08/05/2021   Neuropathy 08/05/2021   Pericardial effusion 08/05/2021   Staphylococcal infectious disease 08/05/2021   Unilateral primary osteoarthritis, right knee 08/05/2021   Cerebral thrombosis with cerebral infarction 04/24/2021   Bacteremia, coagulase-negative staphylococcal 04/21/2021   History of pulmonary embolism 04/21/2021   History of stroke 04/21/2021   DOE (dyspnea on exertion) 01/30/2021   Acute hypoxemic respiratory failure (HCC) 01/29/2021   Bilateral hearing loss 05/15/2020   Bilateral impacted cerumen 05/15/2020   Fungal otitis externa 05/15/2020   Impacted cerumen 05/14/2020   Hearing loss 03/27/2020   Melanocytic nevi, unspecified  03/27/2020   Aphasia 09/01/2019   Cardiomegaly 09/01/2019   Dysphagia 09/01/2019   Cerebral embolism with cerebral infarction 08/29/2019   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HCC) 08/28/2019   Hyperlipidemia 08/28/2019   Chronic kidney disease 08/28/2019   Obesity (BMI 30-39.9) 08/28/2019   Presence of other specified devices 07/28/2019   Tremor 07/07/2019   Cough 01/13/2019   Lymphadenopathy 12/31/2018   Abnormal findings on diagnostic imaging of other specified body structures 06/18/2018   Noncompliance with treatment 03/25/2018   Epistaxis 01/14/2018   Essential tremor 06/22/2017   Fatigue 06/19/2017   Hardening of the aorta (main artery of the heart) (HCC) 04/15/2017   Long term (current) use of anticoagulants 04/14/2017   OSA on CPAP 04/10/2017   Essential hypertension 04/10/2017   Pulmonary thromboembolism (HCC) 04/10/2017   PE (pulmonary thromboembolism) (HCC) 04/09/2017   Orthostatic hypotension 04/09/2017   S/P left TKA 03/30/2017   S/P total knee replacement 03/30/2017   Electrocardiogram abnormal 07/29/2016   Constipation 05/26/2016   Varicose veins of lower extremity 05/26/2016   Cellulitis of left foot 03/07/2016   Pre-ulcerative calluses 03/07/2016   Type 2 diabetes mellitus with left diabetic foot ulcer (HCC) 01/14/2016   Diabetic peripheral neuropathy associated with type 2 diabetes mellitus (HCC) 10/19/2015   Plantar wart 08/08/2015   Encounter for general adult medical examination without abnormal findings 05/17/2015   CKD stage 3b, GFR 30-44 ml/min (HCC) 10/21/2013   Late effects of cerebrovascular disease 10/21/2013   Irritability and anger 03/04/2013   Major depression, single episode, in complete remission (HCC) 03/04/2013   Panic disorder 03/04/2013   Disequilibrium 04/17/2011   Allergic rhinitis 01/23/2011   Diabetic renal disease (HCC) 11/29/2009   Gout 09/20/2009   Osteoarthritis 09/20/2009   Proteinuria 09/20/2009   Ventricular premature  depolarization 09/20/2009   Diabetes mellitus type 2 in obese (HCC) 08/07/2008   GERD 08/07/2008    ONSET DATE: 08/20/2023  REFERRING DIAG: R27.0 (ICD-10-CM) - Ataxia, unspecified   THERAPY DIAG:  Unsteadiness on feet  Other abnormalities of gait and mobility  Muscle weakness (generalized)  History of falling  Rationale for Evaluation and Treatment: Rehabilitation  SUBJECTIVE:  SUBJECTIVE STATEMENT:  Patient reports doing well- is SOB today, but O2 95% after walking back into clinic with rollator. Denies falls.  Pt accompanied by:  Wife, Larita Fife   in lobby  PERTINENT HISTORY: 72 y.o. male with medical history significant for hypertension, type 2 diabetes mellitus, history of PE on Eliquis, OSA on CPAP, CKD 3B, history of CVA (October, 2020 and May, 2022) , neuropathy, and essential tremor with deep brain stimulator who presents with acute onset speech difficulty.   - No acute findings on head CT   - MRI reviewed. No new infarct but new chronic R lacunar infarct compared to MRI in 2022  PAIN:  Are you having pain? No  Vitals:   10/01/23 1107  Pulse: 68  SpO2: 95%        PRECAUTIONS: Fall and Other: DBS for essential tremor   Enjoys reading, used to teach history  PATIENT GOALS: Wants to find exercises to do   OBJECTIVE:  Note: Objective measures were completed at Evaluation unless otherwise noted.  DIAGNOSTIC FINDINGS: 07/31/23  IMPRESSION: 1. Abbreviated examination performed to comply with the patient's deep brain stimulator lead scanning conditions. 2.  No evidence of an acute intracranial abnormality. 3. Cerebral atrophy, chronic small vessel ischemic disease and chronic lacunar infarcts as described. Of note, a chronic lacunar infarct within the right centrum semiovale is  new from the prior brain MRI of 04/24/2021. A chronic microhemorrhage within the right aspect is also new from the prior MRI.   TODAY'S TREATMENT:                                                                                                                               TherAct Today's Vitals   10/01/23 1107  Pulse: 68  SpO2: 95%   Therex: -Scifit hills level 3 2x5 mins   -rating SOB up to 7/10 -sit <>stand 3x6 with zoom ball for improved CV endurance and LE power  -seated trunk twists with 3kg ball    PATIENT EDUCATION:  Education details: monitor SOB with activity  Person educated: Patient Education method: Explanation, Demonstration, and Handouts Education comprehension: verbalized understanding, returned demonstration, and needs further education  HOME EXERCISE PROGRAM:  Access Code: [email or web address] URL: [email or web address] Date: 09/21/2023 Prepared by: Beverely Low  Exercises - Bent Knee Fallouts with Alternating Legs  - 1 x daily - 7 x weekly - 3 sets - 10 reps - Supine March  - 1 x daily - 7 x weekly - 3 sets - 10 reps  You Can Walk For A Certain Length Of Time Each Day                          Walk 1.5 minutes 3 times per day.             Increase 1  minutes every 2-3 days (or when previous time feels easy)  Work up to 20 minutes (1-2 times per day).    GOALS: Goals reviewed with patient? Yes  SHORT TERM GOALS: Target date: 10/09/2023   Pt will be independent with initial HEP in order to build upon functional gains made in therapy. Baseline: Goal status: INITIAL  2.  Pt will improve Berg score to 45/56 for decreased fall risk.  Baseline: 41/56 (09/21/23) Goal status: INITIAL  3.  Pt will trial different R AFOs for improved foot clearance during gait.  Baseline:  Goal status: INITIAL  4.  Pt will improve 5x sit<>stand to less than or equal to 35 sec to demonstrate improved functional strength and transfer efficiency.    Baseline: 40.2 seconds with BUE support from chair  Goal status: INITIAL  5.  Pt will improve gait speed with rollator to at least 2.3 ft/sec in order to demo improved community mobility.  Baseline: 15.6 seconds with rollator =  2.1 ft/sec, limited community ambulator  Goal status: INITIAL    LONG TERM GOALS: Target date: 11/06/2023    Pt will be independent with final HEP in order to build upon functional gains made in therapy. Baseline:  Goal status: INITIAL  2.  Pt will improve Berg score to 49/56 for decreased fall risk.  Baseline: 41/56 (09/21/23) Goal status: INITIAL  3.  Pt will improve 5x sit<>stand to less than or equal to 28 sec to demonstrate improved functional strength and transfer efficiency.  Baseline: 40.2 seconds with BUE support from chair  Goal status: INITIAL  4.  Pt will improve gait speed with rollator to at least 2.6 ft/sec in order to demo improved community mobility.  Baseline:  Goal status: INITIAL  5.  Pt will improve TUG time to 18 seconds or less in order to demo decrease fall risk. Baseline: 22 seconds with rollator Goal status: INITIAL   ASSESSMENT:  CLINICAL IMPRESSION:  Patient seen for skilled PT session with emphasis on CV endurance and functional strength. Patient with increased SOB with minimal exertion this session. Denies SOB at rest. Able to demonstrate increased power production in B LE. Continue POC.    OBJECTIVE IMPAIRMENTS: Abnormal gait, cardiopulmonary status limiting activity, decreased activity tolerance, decreased balance, decreased coordination, decreased endurance, decreased mobility, difficulty walking, decreased ROM, decreased strength, impaired sensation, and postural dysfunction.   ACTIVITY LIMITATIONS: carrying, lifting, bending, standing, stairs, transfers, dressing, and locomotion level  PARTICIPATION LIMITATIONS: meal prep, cleaning, laundry, driving, shopping, and community activity  PERSONAL FACTORS: Age,  Behavior pattern, Fitness, Past/current experiences, Time since onset of injury/illness/exacerbation, and 3+ comorbidities: hypertension, type 2 diabetes mellitus, history of PE on Eliquis, OSA on CPAP, CKD 3B, history of CVA (October, 2020 and May, 2022) , neuropathy, and essential tremor with deep brain stimulator   are also affecting patient's functional outcome.   REHAB POTENTIAL: Good  CLINICAL DECISION MAKING: Evolving/moderate complexity  EVALUATION COMPLEXITY: Moderate  PLAN:  PT FREQUENCY: 2x/week  PT DURATION: 12 weeks - due to delay in scheduling  PLANNED INTERVENTIONS: 97164- PT Re-evaluation, 97110-Therapeutic exercises, 97530- Therapeutic activity, 97112- Neuromuscular re-education, 97535- Self Care, 19147- Manual therapy, [phone]- Gait training, [phone]- Orthotic Fit/training, Patient/Family education, Balance training, Stair training, Vestibular training, and DME instructions  PLAN FOR NEXT SESSION: Add to HEP for strengthening and balance as appropriate, posterior pelvic tilts, STS from elevated surface, clams, heel raises, gait with B AFOs (thusane spry step due to size limitations) *pt w/ difficulty holding objects in either hand, does not want to walk with cane for this reason  Westley Foots, PT Westley Foots, PT, DPT, CBIS  10/01/2023, 11:51 AM

## 2023-10-04 DIAGNOSIS — E1121 Type 2 diabetes mellitus with diabetic nephropathy: Secondary | ICD-10-CM | POA: Diagnosis not present

## 2023-10-05 ENCOUNTER — Ambulatory Visit: Payer: Medicare PPO | Admitting: Physical Therapy

## 2023-10-06 ENCOUNTER — Ambulatory Visit (HOSPITAL_COMMUNITY)
Admission: RE | Admit: 2023-10-06 | Discharge: 2023-10-06 | Disposition: A | Discharge status: Home / Self Care | Payer: Medicare PPO | Source: Ambulatory Visit | Attending: Urology | Admitting: Urology

## 2023-10-06 DIAGNOSIS — K5730 Diverticulosis of large intestine without perforation or abscess without bleeding: Secondary | ICD-10-CM | POA: Diagnosis not present

## 2023-10-06 DIAGNOSIS — R3129 Other microscopic hematuria: Secondary | ICD-10-CM | POA: Insufficient documentation

## 2023-10-06 DIAGNOSIS — N281 Cyst of kidney, acquired: Secondary | ICD-10-CM | POA: Diagnosis not present

## 2023-10-06 DIAGNOSIS — N200 Calculus of kidney: Secondary | ICD-10-CM | POA: Diagnosis not present

## 2023-10-06 DIAGNOSIS — K8020 Calculus of gallbladder without cholecystitis without obstruction: Secondary | ICD-10-CM | POA: Diagnosis not present

## 2023-10-06 DIAGNOSIS — G4739 Other sleep apnea: Secondary | ICD-10-CM | POA: Diagnosis not present

## 2023-10-06 DIAGNOSIS — Z8673 Personal history of transient ischemic attack (TIA), and cerebral infarction without residual deficits: Secondary | ICD-10-CM | POA: Diagnosis not present

## 2023-10-06 DIAGNOSIS — R350 Frequency of micturition: Secondary | ICD-10-CM | POA: Diagnosis not present

## 2023-10-06 DIAGNOSIS — S42401A Unspecified fracture of lower end of right humerus, initial encounter for closed fracture: Secondary | ICD-10-CM | POA: Diagnosis not present

## 2023-10-06 DIAGNOSIS — N1831 Chronic kidney disease, stage 3a: Secondary | ICD-10-CM | POA: Insufficient documentation

## 2023-10-08 DIAGNOSIS — M25521 Pain in right elbow: Secondary | ICD-10-CM | POA: Diagnosis not present

## 2023-10-08 DIAGNOSIS — S42401A Unspecified fracture of lower end of right humerus, initial encounter for closed fracture: Secondary | ICD-10-CM | POA: Diagnosis not present

## 2023-10-09 ENCOUNTER — Ambulatory Visit: Payer: Medicare PPO | Admitting: Physical Therapy

## 2023-10-11 DIAGNOSIS — G4733 Obstructive sleep apnea (adult) (pediatric): Secondary | ICD-10-CM | POA: Diagnosis not present

## 2023-10-12 ENCOUNTER — Ambulatory Visit: Payer: Medicare PPO | Admitting: Physical Therapy

## 2023-10-12 DIAGNOSIS — I129 Hypertensive chronic kidney disease with stage 1 through stage 4 chronic kidney disease, or unspecified chronic kidney disease: Secondary | ICD-10-CM | POA: Diagnosis not present

## 2023-10-12 DIAGNOSIS — M109 Gout, unspecified: Secondary | ICD-10-CM | POA: Diagnosis not present

## 2023-10-12 DIAGNOSIS — E785 Hyperlipidemia, unspecified: Secondary | ICD-10-CM | POA: Diagnosis not present

## 2023-10-12 DIAGNOSIS — N1831 Chronic kidney disease, stage 3a: Secondary | ICD-10-CM | POA: Diagnosis not present

## 2023-10-12 DIAGNOSIS — Z1212 Encounter for screening for malignant neoplasm of rectum: Secondary | ICD-10-CM | POA: Diagnosis not present

## 2023-10-12 DIAGNOSIS — Z125 Encounter for screening for malignant neoplasm of prostate: Secondary | ICD-10-CM | POA: Diagnosis not present

## 2023-10-12 DIAGNOSIS — E1121 Type 2 diabetes mellitus with diabetic nephropathy: Secondary | ICD-10-CM | POA: Diagnosis not present

## 2023-10-14 ENCOUNTER — Other Ambulatory Visit: Payer: Self-pay | Admitting: Cardiology

## 2023-10-15 ENCOUNTER — Ambulatory Visit: Payer: Medicare PPO | Admitting: Physical Therapy

## 2023-10-15 DIAGNOSIS — M25521 Pain in right elbow: Secondary | ICD-10-CM | POA: Diagnosis not present

## 2023-10-16 ENCOUNTER — Ambulatory Visit: Payer: Medicare PPO | Admitting: Podiatry

## 2023-10-16 ENCOUNTER — Encounter: Payer: Self-pay | Admitting: Podiatry

## 2023-10-16 DIAGNOSIS — M21372 Foot drop, left foot: Secondary | ICD-10-CM | POA: Diagnosis not present

## 2023-10-16 DIAGNOSIS — L97511 Non-pressure chronic ulcer of other part of right foot limited to breakdown of skin: Secondary | ICD-10-CM | POA: Diagnosis not present

## 2023-10-16 DIAGNOSIS — M21371 Foot drop, right foot: Secondary | ICD-10-CM | POA: Diagnosis not present

## 2023-10-16 DIAGNOSIS — E11621 Type 2 diabetes mellitus with foot ulcer: Secondary | ICD-10-CM | POA: Diagnosis not present

## 2023-10-16 DIAGNOSIS — M2041 Other hammer toe(s) (acquired), right foot: Secondary | ICD-10-CM

## 2023-10-16 DIAGNOSIS — L97529 Non-pressure chronic ulcer of other part of left foot with unspecified severity: Secondary | ICD-10-CM

## 2023-10-16 DIAGNOSIS — L97521 Non-pressure chronic ulcer of other part of left foot limited to breakdown of skin: Secondary | ICD-10-CM

## 2023-10-16 DIAGNOSIS — M2042 Other hammer toe(s) (acquired), left foot: Secondary | ICD-10-CM

## 2023-10-16 MED ORDER — MUPIROCIN 2 % EX OINT: 1.0000 | TOPICAL_OINTMENT | Freq: Two times a day (BID) | CUTANEOUS | 2 refills | Status: DC

## 2023-10-19 ENCOUNTER — Ambulatory Visit: Payer: Medicare PPO | Admitting: Physical Therapy

## 2023-10-19 DIAGNOSIS — L97529 Non-pressure chronic ulcer of other part of left foot with unspecified severity: Secondary | ICD-10-CM | POA: Diagnosis not present

## 2023-10-19 DIAGNOSIS — Z794 Long term (current) use of insulin: Secondary | ICD-10-CM | POA: Diagnosis not present

## 2023-10-19 DIAGNOSIS — N1831 Chronic kidney disease, stage 3a: Secondary | ICD-10-CM | POA: Diagnosis not present

## 2023-10-19 DIAGNOSIS — R4701 Aphasia: Secondary | ICD-10-CM | POA: Diagnosis not present

## 2023-10-19 DIAGNOSIS — E1129 Type 2 diabetes mellitus with other diabetic kidney complication: Secondary | ICD-10-CM | POA: Diagnosis not present

## 2023-10-19 DIAGNOSIS — Z0000 Encounter for general adult medical examination without abnormal findings: Secondary | ICD-10-CM | POA: Diagnosis not present

## 2023-10-19 DIAGNOSIS — I3139 Other pericardial effusion (noninflammatory): Secondary | ICD-10-CM | POA: Diagnosis not present

## 2023-10-19 DIAGNOSIS — I6990 Unspecified sequelae of unspecified cerebrovascular disease: Secondary | ICD-10-CM | POA: Diagnosis not present

## 2023-10-19 DIAGNOSIS — Z1331 Encounter for screening for depression: Secondary | ICD-10-CM | POA: Diagnosis not present

## 2023-10-19 DIAGNOSIS — Z23 Encounter for immunization: Secondary | ICD-10-CM | POA: Diagnosis not present

## 2023-10-19 DIAGNOSIS — Z1389 Encounter for screening for other disorder: Secondary | ICD-10-CM | POA: Diagnosis not present

## 2023-10-19 NOTE — Progress Notes (Signed)
Subjective: Chief Complaint  Patient presents with   Foot Pain    RM#13 Ulcer on toes both feet/ orthotics    72 year old male presents the office today for concerns of ulcers on his toes.  He states he also tried a brace bilaterally for dropfoot and states it was quite helpful.  Interested in getting these made.  He does not report any drainage or pus otherwise no fevers or chills.  Objective: AAO x3, NAD DP/PT pulses palpable bilaterally, CRT less than 3 seconds There is contractures are present.  There are superficial abrasion type lesions noted on multiple toes bilaterally.  They are mostly at the level of the DIPJ.  There is no probing, undermining or tunneling.  There is no fluctuation or crepitation.  There is no malodor. Dropfoot present. No pain with calf compression, swelling, warmth, erythema  Assessment: Ulcerations bilaterally, dropfoot  Plan: -All treatment options discussed with the patient including all alternatives, risks, complications.  -Lesions are superficial.  Recommend a small amount of antibiotic ointment daily followed by dressing.  Offloading.  Monitor closely for any signs or symptoms of infection. -I do think that he will benefit from AFO braces and a prescription was given for this. -Diabetic shoes were dispensed today by Bethann Berkshire, pedorthist. -Patient encouraged to call the office with any questions, concerns, change in symptoms.   Vivi Barrack DPM

## 2023-10-20 DIAGNOSIS — S42401A Unspecified fracture of lower end of right humerus, initial encounter for closed fracture: Secondary | ICD-10-CM | POA: Diagnosis not present

## 2023-10-20 DIAGNOSIS — M1711 Unilateral primary osteoarthritis, right knee: Secondary | ICD-10-CM | POA: Diagnosis not present

## 2023-10-20 NOTE — Progress Notes (Signed)
Patient presents today to pick up diabetic shoes and insoles.  Patient was dispensed 1 pair of diabetic shoes and 3 pairs of foam casted diabetic insoles. Fit was satisfactory. Instructions for break-in and wear was reviewed and a copy was given to the patient.   Re-appointment for regularly scheduled diabetic foot care visits or if they should experience any trouble with the shoes or insoles.  

## 2023-10-21 ENCOUNTER — Ambulatory Visit: Payer: Medicare PPO

## 2023-10-21 DIAGNOSIS — G4733 Obstructive sleep apnea (adult) (pediatric): Secondary | ICD-10-CM | POA: Diagnosis not present

## 2023-10-26 DIAGNOSIS — I129 Hypertensive chronic kidney disease with stage 1 through stage 4 chronic kidney disease, or unspecified chronic kidney disease: Secondary | ICD-10-CM | POA: Diagnosis not present

## 2023-10-26 DIAGNOSIS — Z8673 Personal history of transient ischemic attack (TIA), and cerebral infarction without residual deficits: Secondary | ICD-10-CM | POA: Diagnosis not present

## 2023-10-26 DIAGNOSIS — F329 Major depressive disorder, single episode, unspecified: Secondary | ICD-10-CM | POA: Diagnosis not present

## 2023-10-26 DIAGNOSIS — M1990 Unspecified osteoarthritis, unspecified site: Secondary | ICD-10-CM | POA: Diagnosis not present

## 2023-10-26 DIAGNOSIS — E785 Hyperlipidemia, unspecified: Secondary | ICD-10-CM | POA: Diagnosis not present

## 2023-10-26 DIAGNOSIS — Z85828 Personal history of other malignant neoplasm of skin: Secondary | ICD-10-CM | POA: Diagnosis not present

## 2023-10-26 DIAGNOSIS — L011 Impetiginization of other dermatoses: Secondary | ICD-10-CM | POA: Diagnosis not present

## 2023-10-26 DIAGNOSIS — E1122 Type 2 diabetes mellitus with diabetic chronic kidney disease: Secondary | ICD-10-CM | POA: Diagnosis not present

## 2023-10-26 DIAGNOSIS — Z9181 History of falling: Secondary | ICD-10-CM | POA: Diagnosis not present

## 2023-10-26 DIAGNOSIS — Z87891 Personal history of nicotine dependence: Secondary | ICD-10-CM | POA: Diagnosis not present

## 2023-10-26 DIAGNOSIS — M2040 Other hammer toe(s) (acquired), unspecified foot: Secondary | ICD-10-CM | POA: Diagnosis not present

## 2023-10-26 DIAGNOSIS — G720 Drug-induced myopathy: Secondary | ICD-10-CM | POA: Diagnosis not present

## 2023-10-26 DIAGNOSIS — E669 Obesity, unspecified: Secondary | ICD-10-CM | POA: Diagnosis not present

## 2023-10-26 DIAGNOSIS — J309 Allergic rhinitis, unspecified: Secondary | ICD-10-CM | POA: Diagnosis not present

## 2023-10-26 DIAGNOSIS — H04129 Dry eye syndrome of unspecified lacrimal gland: Secondary | ICD-10-CM | POA: Diagnosis not present

## 2023-10-26 DIAGNOSIS — G4733 Obstructive sleep apnea (adult) (pediatric): Secondary | ICD-10-CM | POA: Diagnosis not present

## 2023-10-26 DIAGNOSIS — F419 Anxiety disorder, unspecified: Secondary | ICD-10-CM | POA: Diagnosis not present

## 2023-10-26 DIAGNOSIS — G250 Essential tremor: Secondary | ICD-10-CM | POA: Diagnosis not present

## 2023-10-27 ENCOUNTER — Ambulatory Visit: Payer: Medicare PPO | Admitting: Urology

## 2023-10-27 VITALS — BP 98/60 | HR 89 | Ht 76.0 in | Wt 238.0 lb

## 2023-10-27 DIAGNOSIS — R339 Retention of urine, unspecified: Secondary | ICD-10-CM

## 2023-10-27 DIAGNOSIS — N401 Enlarged prostate with lower urinary tract symptoms: Secondary | ICD-10-CM | POA: Diagnosis not present

## 2023-10-27 DIAGNOSIS — R3129 Other microscopic hematuria: Secondary | ICD-10-CM

## 2023-10-27 DIAGNOSIS — N200 Calculus of kidney: Secondary | ICD-10-CM | POA: Diagnosis not present

## 2023-10-27 LAB — URINALYSIS, COMPLETE
Bilirubin, UA: NEGATIVE
Glucose, UA: NEGATIVE
Ketones, UA: NEGATIVE
Leukocytes,UA: NEGATIVE
Nitrite, UA: NEGATIVE
RBC, UA: NEGATIVE
Specific Gravity, UA: 1.025 (ref 1.005–1.030)
Urobilinogen, Ur: 0.2 mg/dL (ref 0.2–1.0)
pH, UA: 5.5 (ref 5.0–7.5)

## 2023-10-27 LAB — MICROSCOPIC EXAMINATION

## 2023-10-27 MED ORDER — FINASTERIDE 5 MG PO TABS: 5.0000 mg | ORAL_TABLET | Freq: Every day | ORAL | 3 refills | Status: DC

## 2023-10-27 NOTE — Progress Notes (Signed)
   10/27/23  CC:  Chief Complaint  Patient presents with   Cysto    HPI: 72 year old male with microscopic hematuria/ gross who presents today for cystoscopic evaluation.  Notably, he is currently on Flomax with moderately to poorly controlled symptoms.  In the interim, he underwent noncontrast CT scan in the setting of CKD for further evaluation of his microscopic/ gross hematuria which did show bilateral renal cyst, Bosniak 1 and 2 as well as some punctate bilateral kidney stones but no other GU pathology identified.  NED. A&Ox3.   No respiratory distress   Abd soft, NT, ND Normal phallus with bilateral descended testicles  Cystoscopy Procedure Note  Patient identification was confirmed, informed consent was obtained, and patient was prepped using Betadine solution.  Lidocaine jelly was administered per urethral meatus.     Pre-Procedure: - Inspection reveals a normal caliber ureteral meatus.  Procedure: The flexible cystoscope was introduced without difficulty - No urethral strictures/lesions are present. - Enlarged prostate with trilobar coaptation and friable prostatic vessels, bleeding with manipulation noted - Normal bladder neck - Bilateral ureteral orifices identified - Bladder mucosa  reveals no ulcers, tumors, or lesions - No bladder stones -Mild trabeculation  Retroflexion shows slight intravesical protrusion without a discrete median lobe   Post-Procedure: - Patient tolerated the procedure well  Assessment/ Plan:  1. Microscopic hematuria/gross hematuria CT scan fairly unremarkable other than some small bilateral stones and renal cyst  Unable to receive IV contrast due to CKD and not a great candidate for bilateral retrograde.  As such, upper duct imaging is incomplete however likely source is from friable prostatic vessels seen today on cystoscopy.  Consider more aggressive workup if gross and microscopic hematuria persist - Urinalysis,  Complete  2. Benign prostatic hyperplasia with incomplete bladder emptying Currently on Flomax, will add finasteride in light of enlarged friable prostate today which will address #1 as well as hopefully improve his urinary symptoms.  Plan to reassess with IPSS/PVR in 6 months -prescription for finasteride sent to pharmacy  3. Kidney stones Small, asymptomatic, with conservative management  Follow-up 6 months with IPSS/PVR  Vanna Scotland, MD

## 2023-10-28 ENCOUNTER — Other Ambulatory Visit: Payer: Self-pay | Admitting: Cardiology

## 2023-11-02 ENCOUNTER — Ambulatory Visit: Payer: Medicare PPO | Admitting: Physical Therapy

## 2023-11-03 ENCOUNTER — Encounter (HOSPITAL_BASED_OUTPATIENT_CLINIC_OR_DEPARTMENT_OTHER): Payer: Self-pay | Admitting: Pulmonary Disease

## 2023-11-03 ENCOUNTER — Ambulatory Visit (HOSPITAL_BASED_OUTPATIENT_CLINIC_OR_DEPARTMENT_OTHER): Payer: Medicare PPO | Admitting: Pulmonary Disease

## 2023-11-03 VITALS — BP 102/62 | HR 72 | Resp 18 | Ht 76.0 in | Wt 247.0 lb

## 2023-11-03 DIAGNOSIS — R0602 Shortness of breath: Secondary | ICD-10-CM | POA: Diagnosis not present

## 2023-11-03 DIAGNOSIS — I952 Hypotension due to drugs: Secondary | ICD-10-CM

## 2023-11-03 DIAGNOSIS — I69351 Hemiplegia and hemiparesis following cerebral infarction affecting right dominant side: Secondary | ICD-10-CM | POA: Diagnosis not present

## 2023-11-03 DIAGNOSIS — E1121 Type 2 diabetes mellitus with diabetic nephropathy: Secondary | ICD-10-CM | POA: Diagnosis not present

## 2023-11-03 DIAGNOSIS — R0609 Other forms of dyspnea: Secondary | ICD-10-CM

## 2023-11-03 NOTE — Progress Notes (Signed)
Subjective:    Patient ID: Joshua [name], male    DOB: 23-Aug-1951, 72 y.o.   MRN: [id number]  HPI  Chief Complaint  Patient presents with   Consult    DOE. Had a stroke 2 years ago and then again 2-3 months ago and since last one it left him with being out of breath with small distances.    Discussed the use of AI scribe software for clinical note transcription with the patient, who gave verbal consent to proceed.  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 The most significant stroke occurred two years ago, and the most recent one was two months ago. He reports that any movement, even walking across the hall, causes breathlessness. He also notes that his speech has been affected by the strokes, and he has weakness and a foot drop on the right side. He has been recommended for braces to help with the foot drop, which he is open to trying. He has not been undergoing physical therapy recently due to a fracture in his right elbow caused by a fall. He is scheduled to start physical therapy for his arm and hand soon. He quit smoking 40 years ago.     Reviewed extensive cardiology evaluation for hypertrophic cardiomyopathy. Echo 06/2023 showed severe LVH. Stress Myoview 06/2023 shows LVEF 45%, low risk for ischemia.  He quit smoking 40 years ago, less than 10 pack years   PMH : hypertrophic cardiomyopathy PE after knee surgery,  type 2 diabetes,  essential tremor s/p DBS   Multiple CVAs with residual right hemiparesis and foot drop  Significant tests/ events reviewed  01/2021 CT cors - clear lungs, calc granuloma RLL ,Diffuse idiopathic skeletal hyperostosis    Past Medical History:  Diagnosis Date   Benign essential tremor    Benign positional vertigo    Chronic kidney disease 08/28/2019   CVA (cerebral  vascular accident) (HCC)    x2 - L retina, 1 right parietal   Degenerative arthritis    Depression    Diabetes mellitus    DVT (deep venous thrombosis) (HCC) 2018   Dyslipidemia    GERD (gastroesophageal reflux disease)    hiatal hernia   Gout    H/O: vasectomy    Hearing aid worn    b/l   Hx of appendectomy    Hx of tonsillectomy    Hypertension    Hypertrophic cardiomyopathy (HCC)    Ischemic optic neuropathy    on the left   Melanoma (HCC)    NSVT (nonsustained ventricular tachycardia) (HCC)    1 4 beat run on event monitor in 07/2020   Obesity    OSA on CPAP    setting = 5   Pulmonary emboli (HCC) 2018   PVC's (premature ventricular contractions)    SVT (supraventricular tachycardia) (HCC)    by event monitor   Tremor, essential 06/22/2017   Wears glasses        Review of Systems Constitutional: negative for anorexia, fevers and sweats  Eyes: negative for irritation, redness and visual disturbance  Ears, nose, mouth, throat, and face: negative for earaches, epistaxis, nasal congestion and sore throat  Respiratory: negative for cough,  sputum and wheezing  Cardiovascular: negative for chest pain, lower extremity edema, orthopnea, palpitations and syncope  Gastrointestinal: negative for abdominal pain, constipation, diarrhea, melena,  nausea and vomiting  Genitourinary:negative for dysuria, frequency and hematuria  Hematologic/lymphatic: negative for bleeding, easy bruising and lymphadenopathy  Musculoskeletal:negative for arthralgias, muscle weakness and stiff joints  Neurological: negative for coordination problems, gait problems, headaches and weakness  Endocrine: negative for diabetic symptoms including polydipsia, polyuria and weight loss     Objective:   Physical Exam  Gen. Pleasant, well-nourished, in no distress, normal affect ENT - no pallor,icterus, no post nasal drip Neck: No JVD, no thyromegaly, no carotid bruits Lungs: no use of accessory muscles,  no dullness to percussion, clear without rales or rhonchi  Cardiovascular: Rhythm regular, heart sounds  normal, no murmurs or gallops, no peripheral edema Abdomen: soft and non-tender, no hepatosplenomegaly, BS normal. Musculoskeletal: No deformities, no cyanosis or clubbing Neuro:  alert, non focal       Assessment & Plan:    Assessment and Plan    Shortness of Breath Progressive shortness of breath over two years, worsening post minor stroke two months ago. Occurs with minimal exertion. Suspected deconditioning secondary to multiple strokes. Cardiac evaluations normal, no significant lung pathology on previous scans. If oxygen levels remain stable during walking test, deconditioning is likely. If oxygen levels drop, further lung evaluation needed. - Reviewed chest x-ray and heart scan - Conduct walking test with oxygen meter to assess oxygen levels and heart rate response - sat satyed ok, HR increased from 100 to 117, c/o dizziness due to low BP - Consider further evaluation if oxygen levels drop during walking test  Stroke Sequelae Multiple strokes, most significant two years ago, resulting in right-sided weakness, foot drop, and speech difficulties. Not currently in physical therapy due to recent elbow fracture but will resume therapy focused on arm and hand. Emphasized importance of physical therapy for balance, strength, and reducing shortness of breath. - Encourage continuation of physical therapy for balance and strength - Follow up with neurologist Dr. Arcelia Jew for ongoing stroke management - Monitor for new neurological symptoms     Hypotension - Stop taking losartan and check this daily, circle back with PCP  Right Elbow Fracture Right elbow fracture from a fall, likely due to balance issues from stroke sequelae. Managed with a removable cast, scheduled to start physical therapy for arm and hand. - Follow up with orthopedic specialist Dr. Orlan Leavens on Friday - Begin physical  therapy for arm and hand as scheduled  General Health Maintenance On Plavix, cholesterol medication, insulin, and metformin. Quit smoking 40 years ago. Recent cardiac evaluations, including sonogram and stress test, were reassuring. - Continue current medications - Maintain regular follow-ups with cardiologist Dr. Bjorn Pippin  Follow-up - Follow up with orthopedic specialist Dr. Orlan Leavens on Friday - Follow up with neurologist Dr. Arcelia Jew as needed - Review results of walking test and chest x-ray.

## 2023-11-03 NOTE — Patient Instructions (Addendum)
Your blood pressure is low today. Stop taking losartan and check this daily.  x check D-dimer If high, we will consider CT scan to rule out blood clots

## 2023-11-04 ENCOUNTER — Ambulatory Visit: Payer: Medicare PPO | Admitting: Physical Therapy

## 2023-11-06 DIAGNOSIS — N1831 Chronic kidney disease, stage 3a: Secondary | ICD-10-CM | POA: Diagnosis not present

## 2023-11-06 DIAGNOSIS — M25521 Pain in right elbow: Secondary | ICD-10-CM | POA: Diagnosis not present

## 2023-11-06 DIAGNOSIS — I129 Hypertensive chronic kidney disease with stage 1 through stage 4 chronic kidney disease, or unspecified chronic kidney disease: Secondary | ICD-10-CM | POA: Diagnosis not present

## 2023-11-06 DIAGNOSIS — E1129 Type 2 diabetes mellitus with other diabetic kidney complication: Secondary | ICD-10-CM | POA: Diagnosis not present

## 2023-11-09 ENCOUNTER — Inpatient Hospital Stay (HOSPITAL_COMMUNITY)
Admission: EM | Admit: 2023-11-09 | Discharge: 2023-11-16 | DRG: 065 | Disposition: A | Discharge status: Skilled Nursing Facility | Payer: No Typology Code available for payment source | Attending: Internal Medicine | Admitting: Internal Medicine

## 2023-11-09 ENCOUNTER — Emergency Department (HOSPITAL_COMMUNITY): Payer: No Typology Code available for payment source

## 2023-11-09 ENCOUNTER — Ambulatory Visit: Payer: Medicare PPO | Admitting: Physical Therapy

## 2023-11-09 DIAGNOSIS — I6300 Cerebral infarction due to thrombosis of unspecified precerebral artery: Secondary | ICD-10-CM | POA: Diagnosis not present

## 2023-11-09 DIAGNOSIS — I422 Other hypertrophic cardiomyopathy: Secondary | ICD-10-CM | POA: Diagnosis present

## 2023-11-09 DIAGNOSIS — G8194 Hemiplegia, unspecified affecting left nondominant side: Secondary | ICD-10-CM | POA: Diagnosis present

## 2023-11-09 DIAGNOSIS — E119 Type 2 diabetes mellitus without complications: Secondary | ICD-10-CM | POA: Diagnosis not present

## 2023-11-09 DIAGNOSIS — I6381 Other cerebral infarction due to occlusion or stenosis of small artery: Secondary | ICD-10-CM | POA: Diagnosis present

## 2023-11-09 DIAGNOSIS — Y92003 Bedroom of unspecified non-institutional (private) residence as the place of occurrence of the external cause: Secondary | ICD-10-CM | POA: Diagnosis not present

## 2023-11-09 DIAGNOSIS — E785 Hyperlipidemia, unspecified: Secondary | ICD-10-CM | POA: Diagnosis present

## 2023-11-09 DIAGNOSIS — G4733 Obstructive sleep apnea (adult) (pediatric): Secondary | ICD-10-CM | POA: Diagnosis present

## 2023-11-09 DIAGNOSIS — M48061 Spinal stenosis, lumbar region without neurogenic claudication: Secondary | ICD-10-CM | POA: Diagnosis not present

## 2023-11-09 DIAGNOSIS — G250 Essential tremor: Secondary | ICD-10-CM | POA: Diagnosis present

## 2023-11-09 DIAGNOSIS — M109 Gout, unspecified: Secondary | ICD-10-CM | POA: Diagnosis present

## 2023-11-09 DIAGNOSIS — I358 Other nonrheumatic aortic valve disorders: Secondary | ICD-10-CM | POA: Diagnosis present

## 2023-11-09 DIAGNOSIS — Z9049 Acquired absence of other specified parts of digestive tract: Secondary | ICD-10-CM

## 2023-11-09 DIAGNOSIS — G8191 Hemiplegia, unspecified affecting right dominant side: Secondary | ICD-10-CM | POA: Diagnosis present

## 2023-11-09 DIAGNOSIS — Z7982 Long term (current) use of aspirin: Secondary | ICD-10-CM

## 2023-11-09 DIAGNOSIS — Z9841 Cataract extraction status, right eye: Secondary | ICD-10-CM

## 2023-11-09 DIAGNOSIS — N400 Enlarged prostate without lower urinary tract symptoms: Secondary | ICD-10-CM | POA: Diagnosis present

## 2023-11-09 DIAGNOSIS — E1169 Type 2 diabetes mellitus with other specified complication: Secondary | ICD-10-CM | POA: Diagnosis not present

## 2023-11-09 DIAGNOSIS — M6281 Muscle weakness (generalized): Secondary | ICD-10-CM | POA: Diagnosis not present

## 2023-11-09 DIAGNOSIS — R471 Dysarthria and anarthria: Secondary | ICD-10-CM | POA: Diagnosis present

## 2023-11-09 DIAGNOSIS — R29704 NIHSS score 4: Secondary | ICD-10-CM | POA: Diagnosis present

## 2023-11-09 DIAGNOSIS — I129 Hypertensive chronic kidney disease with stage 1 through stage 4 chronic kidney disease, or unspecified chronic kidney disease: Secondary | ICD-10-CM | POA: Diagnosis present

## 2023-11-09 DIAGNOSIS — R29810 Facial weakness: Secondary | ICD-10-CM | POA: Diagnosis present

## 2023-11-09 DIAGNOSIS — I82403 Acute embolism and thrombosis of unspecified deep veins of lower extremity, bilateral: Secondary | ICD-10-CM | POA: Diagnosis present

## 2023-11-09 DIAGNOSIS — R1312 Dysphagia, oropharyngeal phase: Secondary | ICD-10-CM | POA: Diagnosis not present

## 2023-11-09 DIAGNOSIS — E663 Overweight: Secondary | ICD-10-CM | POA: Diagnosis present

## 2023-11-09 DIAGNOSIS — E669 Obesity, unspecified: Secondary | ICD-10-CM | POA: Diagnosis present

## 2023-11-09 DIAGNOSIS — F325 Major depressive disorder, single episode, in full remission: Secondary | ICD-10-CM | POA: Diagnosis not present

## 2023-11-09 DIAGNOSIS — W06XXXA Fall from bed, initial encounter: Secondary | ICD-10-CM | POA: Diagnosis present

## 2023-11-09 DIAGNOSIS — R4701 Aphasia: Secondary | ICD-10-CM | POA: Diagnosis present

## 2023-11-09 DIAGNOSIS — Z96652 Presence of left artificial knee joint: Secondary | ICD-10-CM | POA: Diagnosis present

## 2023-11-09 DIAGNOSIS — Z888 Allergy status to other drugs, medicaments and biological substances status: Secondary | ICD-10-CM

## 2023-11-09 DIAGNOSIS — M6259 Muscle wasting and atrophy, not elsewhere classified, multiple sites: Secondary | ICD-10-CM | POA: Diagnosis not present

## 2023-11-09 DIAGNOSIS — N1831 Chronic kidney disease, stage 3a: Secondary | ICD-10-CM | POA: Diagnosis not present

## 2023-11-09 DIAGNOSIS — K219 Gastro-esophageal reflux disease without esophagitis: Secondary | ICD-10-CM | POA: Diagnosis present

## 2023-11-09 DIAGNOSIS — Z86711 Personal history of pulmonary embolism: Secondary | ICD-10-CM

## 2023-11-09 DIAGNOSIS — R42 Dizziness and giddiness: Secondary | ICD-10-CM | POA: Diagnosis not present

## 2023-11-09 DIAGNOSIS — I350 Nonrheumatic aortic (valve) stenosis: Secondary | ICD-10-CM | POA: Diagnosis not present

## 2023-11-09 DIAGNOSIS — E1122 Type 2 diabetes mellitus with diabetic chronic kidney disease: Secondary | ICD-10-CM | POA: Diagnosis present

## 2023-11-09 DIAGNOSIS — I639 Cerebral infarction, unspecified: Secondary | ICD-10-CM | POA: Diagnosis not present

## 2023-11-09 DIAGNOSIS — Q2112 Patent foramen ovale: Secondary | ICD-10-CM

## 2023-11-09 DIAGNOSIS — W19XXXA Unspecified fall, initial encounter: Secondary | ICD-10-CM | POA: Diagnosis not present

## 2023-11-09 DIAGNOSIS — I2510 Atherosclerotic heart disease of native coronary artery without angina pectoris: Secondary | ICD-10-CM | POA: Diagnosis present

## 2023-11-09 DIAGNOSIS — Z9852 Vasectomy status: Secondary | ICD-10-CM

## 2023-11-09 DIAGNOSIS — E1142 Type 2 diabetes mellitus with diabetic polyneuropathy: Secondary | ICD-10-CM

## 2023-11-09 DIAGNOSIS — I3139 Other pericardial effusion (noninflammatory): Secondary | ICD-10-CM | POA: Diagnosis present

## 2023-11-09 DIAGNOSIS — Z794 Long term (current) use of insulin: Secondary | ICD-10-CM

## 2023-11-09 DIAGNOSIS — R278 Other lack of coordination: Secondary | ICD-10-CM | POA: Diagnosis not present

## 2023-11-09 DIAGNOSIS — Z9842 Cataract extraction status, left eye: Secondary | ICD-10-CM

## 2023-11-09 DIAGNOSIS — N1832 Chronic kidney disease, stage 3b: Secondary | ICD-10-CM | POA: Diagnosis present

## 2023-11-09 DIAGNOSIS — I613 Nontraumatic intracerebral hemorrhage in brain stem: Secondary | ICD-10-CM | POA: Diagnosis not present

## 2023-11-09 DIAGNOSIS — Z6829 Body mass index (BMI) 29.0-29.9, adult: Secondary | ICD-10-CM

## 2023-11-09 DIAGNOSIS — M5106 Intervertebral disc disorders with myelopathy, lumbar region: Secondary | ICD-10-CM | POA: Diagnosis not present

## 2023-11-09 DIAGNOSIS — M51379 Other intervertebral disc degeneration, lumbosacral region without mention of lumbar back pain or lower extremity pain: Secondary | ICD-10-CM | POA: Diagnosis not present

## 2023-11-09 DIAGNOSIS — Z79899 Other long term (current) drug therapy: Secondary | ICD-10-CM

## 2023-11-09 DIAGNOSIS — I10 Essential (primary) hypertension: Secondary | ICD-10-CM | POA: Diagnosis present

## 2023-11-09 DIAGNOSIS — G459 Transient cerebral ischemic attack, unspecified: Secondary | ICD-10-CM | POA: Diagnosis not present

## 2023-11-09 DIAGNOSIS — R29818 Other symptoms and signs involving the nervous system: Secondary | ICD-10-CM | POA: Diagnosis not present

## 2023-11-09 DIAGNOSIS — Z7401 Bed confinement status: Secondary | ICD-10-CM | POA: Diagnosis not present

## 2023-11-09 DIAGNOSIS — Z87891 Personal history of nicotine dependence: Secondary | ICD-10-CM

## 2023-11-09 DIAGNOSIS — Z7902 Long term (current) use of antithrombotics/antiplatelets: Secondary | ICD-10-CM

## 2023-11-09 DIAGNOSIS — R29898 Other symptoms and signs involving the musculoskeletal system: Secondary | ICD-10-CM | POA: Diagnosis not present

## 2023-11-09 DIAGNOSIS — Z7901 Long term (current) use of anticoagulants: Secondary | ICD-10-CM

## 2023-11-09 DIAGNOSIS — M47816 Spondylosis without myelopathy or radiculopathy, lumbar region: Secondary | ICD-10-CM | POA: Diagnosis not present

## 2023-11-09 DIAGNOSIS — I63512 Cerebral infarction due to unspecified occlusion or stenosis of left middle cerebral artery: Secondary | ICD-10-CM | POA: Diagnosis not present

## 2023-11-09 DIAGNOSIS — Z86718 Personal history of other venous thrombosis and embolism: Secondary | ICD-10-CM

## 2023-11-09 DIAGNOSIS — Z9682 Presence of neurostimulator: Secondary | ICD-10-CM

## 2023-11-09 DIAGNOSIS — I6389 Other cerebral infarction: Secondary | ICD-10-CM | POA: Diagnosis not present

## 2023-11-09 DIAGNOSIS — Z85820 Personal history of malignant melanoma of skin: Secondary | ICD-10-CM

## 2023-11-09 DIAGNOSIS — R531 Weakness: Secondary | ICD-10-CM | POA: Diagnosis present

## 2023-11-09 DIAGNOSIS — Z7984 Long term (current) use of oral hypoglycemic drugs: Secondary | ICD-10-CM

## 2023-11-09 LAB — CBC WITH DIFFERENTIAL/PLATELET
Abs Immature Granulocytes: 0.03 10*3/uL (ref 0.00–0.07)
Basophils Absolute: 0.1 10*3/uL (ref 0.0–0.1)
Basophils Relative: 1 %
Eosinophils Absolute: 0.2 10*3/uL (ref 0.0–0.5)
Eosinophils Relative: 2 %
HCT: 35.9 % — ABNORMAL LOW (ref 39.0–52.0)
Hemoglobin: 11.5 g/dL — ABNORMAL LOW (ref 13.0–17.0)
Immature Granulocytes: 0 %
Lymphocytes Relative: 35 %
Lymphs Abs: 2.5 10*3/uL (ref 0.7–4.0)
MCH: 29.5 pg (ref 26.0–34.0)
MCHC: 32 g/dL (ref 30.0–36.0)
MCV: 92.1 fL (ref 80.0–100.0)
Monocytes Absolute: 0.6 10*3/uL (ref 0.1–1.0)
Monocytes Relative: 9 %
Neutro Abs: 3.8 10*3/uL (ref 1.7–7.7)
Neutrophils Relative %: 53 %
Platelets: 276 10*3/uL (ref 150–400)
RBC: 3.9 MIL/uL — ABNORMAL LOW (ref 4.22–5.81)
RDW: 13.6 % (ref 11.5–15.5)
WBC: 7.1 10*3/uL (ref 4.0–10.5)
nRBC: 0 % (ref 0.0–0.2)

## 2023-11-09 LAB — URINALYSIS, W/ REFLEX TO CULTURE (INFECTION SUSPECTED)
Bilirubin Urine: NEGATIVE
Glucose, UA: NEGATIVE mg/dL
Hgb urine dipstick: NEGATIVE
Ketones, ur: NEGATIVE mg/dL
Leukocytes,Ua: NEGATIVE
Nitrite: NEGATIVE
Protein, ur: >=300 mg/dL — AB
Specific Gravity, Urine: 1.014 (ref 1.005–1.030)
pH: 5 (ref 5.0–8.0)

## 2023-11-09 LAB — COMPREHENSIVE METABOLIC PANEL
ALT: 30 U/L (ref 0–44)
AST: 35 U/L (ref 15–41)
Albumin: 3.3 g/dL — ABNORMAL LOW (ref 3.5–5.0)
Alkaline Phosphatase: 101 U/L (ref 38–126)
Anion gap: 10 (ref 5–15)
BUN: 38 mg/dL — ABNORMAL HIGH (ref 8–23)
CO2: 23 mmol/L (ref 22–32)
Calcium: 9.3 mg/dL (ref 8.9–10.3)
Chloride: 109 mmol/L (ref 98–111)
Creatinine, Ser: 2.02 mg/dL — ABNORMAL HIGH (ref 0.61–1.24)
GFR, Estimated: 34 mL/min — ABNORMAL LOW (ref >60)
Glucose, Bld: 90 mg/dL (ref 70–99)
Potassium: 4.2 mmol/L (ref 3.5–5.1)
Sodium: 142 mmol/L (ref 135–145)
Total Bilirubin: 0.7 mg/dL (ref <1.2)
Total Protein: 6.6 g/dL (ref 6.5–8.1)

## 2023-11-09 LAB — CBG MONITORING, ED: Glucose-Capillary: 87 mg/dL (ref 70–99)

## 2023-11-09 MED ORDER — LORAZEPAM 1 MG PO TABS
1.0000 mg | ORAL_TABLET | ORAL | Status: AC | PRN
  Administered 2023-11-10: 1 mg via ORAL
  Filled 2023-11-09: qty 1

## 2023-11-09 NOTE — ED Triage Notes (Signed)
Patient BIB [facility] from home for bilateral leg weakness starting yesterday afternoon. Patient c/o "strange sensation" from left knee down. Hx of stroke, LKW yesterday morning. A&Ox4, no other unilateral deficits, edema noted in both legs this morning. VSS, 146/88, HR 82, RR 18, 98% RA. CBG 111

## 2023-11-09 NOTE — ED Notes (Signed)
Patient is resting comfortably. 

## 2023-11-09 NOTE — ED Provider Notes (Signed)
  Provider Note MRN:  [id number]  Arrival date & time: 11/10/23    ED Course and Medical Decision Making  Assumed care from Dr. Karene Fry at shift change.  Lower body weakness awaiting MRI imaging.  Informed by MRI department that patient cannot obtain MRI until tomorrow morning because of his deep brain stimulator.  Due to his functional difficulties, plan is for [facility] consultation if no indication for admission.  Due to patient's deep brain stimulator MRI will have to wait until the morning.  Signed out to oncoming provider at shift change.  Procedures  Final Clinical Impressions(s) / ED Diagnoses     ICD-10-CM   1. Weakness  R53.1       ED Discharge Orders     None       Discharge Instructions   None     Joshua Sow. Pilar Plate, MD [facility] [email or web address]    Sabas Sous, MD 11/10/23 [phone]

## 2023-11-09 NOTE — ED Notes (Signed)
Dr. Pilar Plate (EDP) notified that patient has a brain stimulator .

## 2023-11-09 NOTE — ED Provider Notes (Signed)
  Physical Exam  BP 136/77   Pulse 80   Temp 98.6 F (37 C) (Oral)   Resp 20   SpO2 98%     Procedures  Procedures  ED Course / MDM   Clinical Course as of 11/09/23 2147  Mon Nov 09, 2023  1537 Labs at baseline. Patient signed out to Dr. Karene Fry pending [facility], likely will need MRI if no obvious explanation of weakness on CT. [VK]    Clinical Course User Index [VK] Rexford Maus, DO   Medical Decision Making Amount and/or Complexity of Data Reviewed Labs: ordered. Radiology: ordered.  Risk Prescription drug management.   48M, presenting with bilateral leg weakness and leg numbness. Sx started yesterday afternoon. CT pending, needs MRI Head and Lumbar spine.   MRI imaging was pending at time of signout.  Patient and family had mentioned to nursing staff that the patient required admission was unable to care for himself in the home.  If MRI imaging is reassuring and negative, we will plan to consult PT and OT.  Signout given to Dr. Pilar Plate at 2300.    Ernie Avena, MD 11/09/23 2231

## 2023-11-09 NOTE — ED Provider Notes (Signed)
[name] [facility]GENC[facility] DE[facility]MENT AT [facility] Provider Note   CSN: [id number] Arrival date & time: 11/09/23  1132     History  Chief Complaint  Patient presents with   Weakness    Joshua [name] is a 72 y.o. male.  Patient is a 72 year old male with a past medical history of prior CVA on aspirin and Plavix, hypertension, diabetes, CKD presenting to the emergency department with leg numbness and weakness.  The patient states when he woke up yesterday morning he was walking to the kitchen when the backs of his legs felt numb bilaterally.  He states then around 4 PM his legs started to feel weak, worse on the right than the left.  He states that he was going to the bathroom and nearly fell but was able to get himself out of the bathroom and into the bed before falling.  He states that when he woke up this morning he tried to get out of bed and slipped from sitting on the edge of the bed to the ground.  He denies hitting his head.  He states that he is legs felt too weak and numb to support him.  He denies any associated headache or back pain.  He denies any dysuria or hematuria or urinary incontinence or retention.  He denies any saddle anesthesia.  He states that prior to yesterday he was feeling like his normal self without any recent fevers, nausea, vomiting or diarrhea.  The history is provided by the patient.  Weakness      Home Medications Prior to Admission medications   Medication Sig Start Date End Date Taking? Authorizing Provider  amLODipine (NORVASC) 10 MG tablet TAKE ONE TABLET AT BEDTIME. please call to schedule yearly appointment for additional refills 10/15/23   Little Ishikawa, MD  aspirin 81 MG EC tablet 81 mg daily. 04/23/21   [provider]  atorvastatin (LIPITOR) 80 MG tablet Take 1 tablet (80 mg total) by mouth daily at 6 PM. Patient taking differently: Take 40 mg by mouth daily at 6 PM. 08/31/19   Danford, Earl Lites, MD  clopidogrel  (PLAVIX) 75 MG tablet Take 1 tablet (75 mg total) by mouth daily. 08/18/23   Ronney Asters, NP  colchicine 0.6 MG tablet     [provider]  Continuous Blood Gluc Sensor (FREESTYLE LIBRE 2 SENSOR) MISC See admin instructions.    [provider]  cycloSPORINE (RESTASIS) 0.05 % ophthalmic emulsion Apply 1 drop to eye 2 (two) times daily. 01/29/21   [provider]  DULoxetine (CYMBALTA) 60 MG capsule Take 60 mg by mouth 2 (two) times daily.    [provider]  ezetimibe (ZETIA) 10 MG tablet TAKE ONE TABLET BY MOUTH ONCE DAILY. 09/29/23   Little Ishikawa, MD  fenofibrate 54 MG tablet Take 54 mg by mouth daily.    [provider]  finasteride (PROSCAR) 5 MG tablet Take 1 tablet (5 mg total) by mouth daily. 10/27/23   Vanna Scotland, MD  Insulin Glargine, 1 Unit Dial, (TOUJEO SOLOSTAR) 300 UNIT/ML SOPN Inject 70 Units into the skin 2 (two) times daily.    [provider]  losartan (COZAAR) 100 MG tablet Take 1 tablet by mouth daily. 03/25/20   [provider]  metFORMIN (GLUCOPHAGE) 500 MG tablet Take 500 mg by mouth 2 (two) times daily with a meal. 03/25/20   [provider]  metoprolol succinate (TOPROL-XL) 50 MG 24 hr tablet Take 1 tablet (50  mg total) by mouth daily. 10/30/23   Little Ishikawa, MD  mirtazapine (REMERON) 7.5 MG tablet Take 7.5 mg by mouth at bedtime.    [provider]  Multiple Vitamin (MULTIVITAMIN WITH MINERALS) TABS tablet Take 1 tablet by mouth daily.    [provider]  mupirocin ointment (BACTROBAN) 2 % Apply 1 Application topically 2 (two) times daily. 10/16/23   Vivi Barrack, DPM  ONETOUCH VERIO test strip  09/13/18   [provider]  Polyethyl Glycol-Propyl Glycol (SYSTANE OP) Place 1-2 drops into both eyes 4 (four) times daily as needed (dry eyes).     [provider]  PRESCRIPTION MEDICATION Inhale into the lungs at bedtime. CPAP    [provider]  SURE COMFORT PEN NEEDLES 31G X 5 MM MISC  09/12/19   [provider]  tamsulosin (FLOMAX) 0.4 MG CAPS capsule Take 1 capsule (0.4 mg total) by mouth daily. 09/23/23   Vanna Scotland, MD      Allergies    Melatonin    Review of Systems   Review of Systems  Neurological:  Positive for weakness.    Physical Exam Updated Vital Signs BP (!) 154/92   Pulse 84   Temp 97.8 F (36.6 C) (Oral)   Resp 16   SpO2 99%  Physical Exam Vitals and nursing note reviewed.  Constitutional:      General: He is not in acute distress.    Appearance: Normal appearance. He is obese.  HENT:     Head: Normocephalic and atraumatic.     Nose: Nose normal.     Mouth/Throat:     Mouth: Mucous membranes are moist.     Pharynx: Oropharynx is clear.  Eyes:     Extraocular Movements: Extraocular movements intact.     Conjunctiva/sclera: Conjunctivae normal.     Pupils: Pupils are equal, round, and reactive to light.  Neck:     Comments: No midline neck tenderness Cardiovascular:     Rate and Rhythm: Normal rate and regular rhythm.     Heart sounds: Normal heart sounds.  Pulmonary:     Effort: Pulmonary effort is normal.     Breath sounds: Normal breath sounds.  Abdominal:     General: Abdomen is flat.     Palpations: Abdomen is soft.     Tenderness: There is no abdominal tenderness.  Musculoskeletal:        General: Normal range of motion.     Cervical back: Normal range of motion and neck supple.     Right lower leg: No edema.     Left lower leg: No edema.  Skin:    General: Skin is warm and dry.  Neurological:     Mental Status: He is alert and oriented to person, place, and time.     Cranial Nerves: No cranial nerve deficit.     Coordination: Coordination normal.     Comments: Essential tremor Normal speech No drift in bilateral UE, normal sensation in bilateral LE  Unable to hold R leg up off the bed, 5/5 strength in knee extension and  plantar/dorsiflexion Decreased sensation on R dorsal surface of foot and R medial shin  Mild drift in LLE, 5/5 strength in knee extension and plantar/dorsiflextion Decreased sensation on L medial foot and medial shin  Psychiatric:        Mood and Affect: Mood normal.        Behavior: Behavior normal.     ED Results / Procedures /  Treatments   Labs (all labs ordered are listed, but only abnormal results are displayed) Labs Reviewed  COMPREHENSIVE METABOLIC PANEL - Abnormal; Notable for the following components:      Result Value   BUN 38 (*)    Creatinine, Ser 2.02 (*)    Albumin 3.3 (*)    GFR, Estimated 34 (*)    All other components within normal limits  CBC WITH DIFFERENTIAL/PLATELET - Abnormal; Notable for the following components:   RBC 3.90 (*)    Hemoglobin 11.5 (*)    HCT 35.9 (*)    All other components within normal limits  URINALYSIS, W/ REFLEX TO CULTURE (INFECTION SUSPECTED)  CBG MONITORING, ED    EKG EKG Interpretation Date/Time:  Monday November 09 2023 15:06:04 EST Ventricular Rate:  85 PR Interval:  266 QRS Duration:  128 QT Interval:  376 QTC Calculation: 447 R Axis:   -61  Text Interpretation: Sinus rhythm with 1st degree A-V block Left axis deviation Non-specific intra-ventricular conduction block Minimal voltage criteria for LVH, may be normal variant ( Cornell product ) Cannot rule out Anterior infarct , age undetermined Abnormal ECG No significant change was found Confirmed by Elayne Snare ([id number]) on 11/09/2023 3:28:10 PM  Radiology No results found.  Procedures Procedures    Medications Ordered in ED Medications - No data to display  ED Course/ Medical Decision Making/ A&P Clinical Course as of 11/09/23 1538  Mon Nov 09, 2023  1537 Labs at baseline. Patient signed out to Dr. Karene Fry pending [facility], likely will need MRI if no obvious explanation of weakness on CT. [VK]    Clinical Course User Index [VK] Rexford Maus, DO                                  Medical Decision Making This patient presents to the ED with chief complaint(s) of LE numbness/weakness with pertinent past medical history of prior CVA, DM, HTN, CKD which further complicates the presenting complaint. The complaint involves an extensive differential diagnosis and also carries with it a high risk of complications and morbidity.    The differential diagnosis includes CVA, TIA, no back pain or significant trauma and no urinary tension or incontinence making cauda equina less likely, considering peripheral neuropathy or other spinal cord injury, no fever or recent manipulation making an epidural abscess unlikely, dehydration, electrolyte abnormality, arrhythmia, anemia, infection  Additional history obtained: Additional history obtained from N/A Records reviewed previous admission documents and outpatient cardiology records  ED Course and Reassessment: On patient's arrival he is hemodynamically stable in no acute distress.  Patient does have weakness in the left more than the right with decreased sensation around the feet, does not have a clear pattern consistent with stroke.  Independent labs interpretation:  The following labs were independently interpreted: at baseline  Independent visualization of imaging: - Pending     Amount and/or Complexity of Data Reviewed Labs: ordered. Radiology: ordered.          Final Clinical Impression(s) / ED Diagnoses Final diagnoses:  None    Rx / DC Orders ED Discharge Orders     None         Rexford Maus, DO 11/09/23 1538

## 2023-11-10 ENCOUNTER — Encounter (HOSPITAL_COMMUNITY): Payer: Self-pay | Admitting: Radiology

## 2023-11-10 ENCOUNTER — Other Ambulatory Visit: Payer: Self-pay

## 2023-11-10 ENCOUNTER — Emergency Department (HOSPITAL_COMMUNITY): Payer: No Typology Code available for payment source

## 2023-11-10 DIAGNOSIS — G4733 Obstructive sleep apnea (adult) (pediatric): Secondary | ICD-10-CM | POA: Diagnosis not present

## 2023-11-10 LAB — CBG MONITORING, ED: Glucose-Capillary: 108 mg/dL — ABNORMAL HIGH (ref 70–99)

## 2023-11-10 MED ORDER — CLOPIDOGREL BISULFATE 75 MG PO TABS: 75.0000 mg | ORAL_TABLET | Freq: Every day | ORAL | Status: DC

## 2023-11-10 MED ORDER — INSULIN GLARGINE-YFGN 100 UNIT/ML ~~LOC~~ SOLN: 70.0000 [IU] | Freq: Two times a day (BID) | SUBCUTANEOUS | Status: DC

## 2023-11-10 MED ORDER — FINASTERIDE 5 MG PO TABS
5.0000 mg | ORAL_TABLET | Freq: Every day | ORAL | Status: DC
  Administered 2023-11-10 – 2023-11-16 (×7): 5 mg via ORAL
  Filled 2023-11-10 (×7): qty 1

## 2023-11-10 MED ORDER — ASPIRIN 81 MG PO TBEC: 81.0000 mg | DELAYED_RELEASE_TABLET | Freq: Every day | ORAL | Status: DC

## 2023-11-10 MED ORDER — LOSARTAN POTASSIUM 50 MG PO TABS
100.0000 mg | ORAL_TABLET | Freq: Every day | ORAL | Status: DC
  Administered 2023-11-10 – 2023-11-16 (×7): 100 mg via ORAL
  Filled 2023-11-10 (×7): qty 2

## 2023-11-10 MED ORDER — ATORVASTATIN CALCIUM 40 MG PO TABS
40.0000 mg | ORAL_TABLET | Freq: Every day | ORAL | Status: DC
  Administered 2023-11-10 – 2023-11-15 (×6): 40 mg via ORAL
  Filled 2023-11-10 (×6): qty 1

## 2023-11-10 MED ORDER — METOPROLOL SUCCINATE ER 50 MG PO TB24
50.0000 mg | ORAL_TABLET | Freq: Every day | ORAL | Status: DC
  Administered 2023-11-10 – 2023-11-16 (×7): 50 mg via ORAL
  Filled 2023-11-10 (×4): qty 1
  Filled 2023-11-10: qty 2
  Filled 2023-11-10 (×2): qty 1

## 2023-11-10 MED ORDER — METOPROLOL SUCCINATE ER 25 MG PO TB24: 50.0000 mg | ORAL_TABLET | Freq: Every day | ORAL | Status: DC

## 2023-11-10 MED ORDER — CLOPIDOGREL BISULFATE 75 MG PO TABS
75.0000 mg | ORAL_TABLET | Freq: Every day | ORAL | Status: DC
  Administered 2023-11-10 – 2023-11-12 (×3): 75 mg via ORAL
  Filled 2023-11-10 (×3): qty 1

## 2023-11-10 MED ORDER — ASPIRIN 81 MG PO TBEC
81.0000 mg | DELAYED_RELEASE_TABLET | Freq: Every day | ORAL | Status: DC
  Administered 2023-11-10 – 2023-11-14 (×5): 81 mg via ORAL
  Filled 2023-11-10 (×6): qty 1

## 2023-11-10 MED ORDER — EZETIMIBE 10 MG PO TABS
10.0000 mg | ORAL_TABLET | Freq: Every day | ORAL | Status: DC
  Administered 2023-11-10 – 2023-11-16 (×7): 10 mg via ORAL
  Filled 2023-11-10 (×7): qty 1

## 2023-11-10 MED ORDER — MIRTAZAPINE 15 MG PO TABS
7.5000 mg | ORAL_TABLET | Freq: Every day | ORAL | Status: DC
  Administered 2023-11-10 – 2023-11-16 (×6): 7.5 mg via ORAL
  Filled 2023-11-10 (×6): qty 1

## 2023-11-10 MED ORDER — DULOXETINE HCL 60 MG PO CPEP
60.0000 mg | ORAL_CAPSULE | Freq: Two times a day (BID) | ORAL | Status: DC
  Administered 2023-11-10 – 2023-11-16 (×12): 60 mg via ORAL
  Filled 2023-11-10 (×7): qty 1
  Filled 2023-11-10: qty 2
  Filled 2023-11-10 (×4): qty 1

## 2023-11-10 MED ORDER — METFORMIN HCL 500 MG PO TABS
500.0000 mg | ORAL_TABLET | Freq: Two times a day (BID) | ORAL | Status: DC
  Administered 2023-11-10: 500 mg via ORAL
  Filled 2023-11-10: qty 1

## 2023-11-10 MED ORDER — TAMSULOSIN HCL 0.4 MG PO CAPS: 0.4000 mg | ORAL_CAPSULE | Freq: Every day | ORAL | Status: DC

## 2023-11-10 MED ORDER — INSULIN GLARGINE (1 UNIT DIAL) 300 UNIT/ML ~~LOC~~ SOPN: 70.0000 [IU] | PEN_INJECTOR | Freq: Two times a day (BID) | SUBCUTANEOUS | Status: DC

## 2023-11-10 MED ORDER — FINASTERIDE 5 MG PO TABS: 5.0000 mg | ORAL_TABLET | Freq: Every day | ORAL | Status: DC

## 2023-11-10 MED ORDER — AMLODIPINE BESYLATE 5 MG PO TABS: 5.0000 mg | ORAL_TABLET | Freq: Every day | ORAL | Status: DC

## 2023-11-10 MED ORDER — EZETIMIBE 10 MG PO TABS: 10.0000 mg | ORAL_TABLET | Freq: Every day | ORAL | Status: DC

## 2023-11-10 MED ORDER — AMLODIPINE BESYLATE 5 MG PO TABS
2.5000 mg | ORAL_TABLET | Freq: Every day | ORAL | Status: DC
  Administered 2023-11-10 – 2023-11-16 (×7): 2.5 mg via ORAL
  Filled 2023-11-10 (×7): qty 1

## 2023-11-10 MED ORDER — INSULIN GLARGINE-YFGN 100 UNIT/ML ~~LOC~~ SOLN
70.0000 [IU] | Freq: Two times a day (BID) | SUBCUTANEOUS | Status: DC
  Filled 2023-11-10: qty 0.7

## 2023-11-10 MED ORDER — DULOXETINE HCL 30 MG PO CPEP: 60.0000 mg | ORAL_CAPSULE | Freq: Two times a day (BID) | ORAL | Status: DC

## 2023-11-10 MED ORDER — LOSARTAN POTASSIUM 50 MG PO TABS: 100.0000 mg | ORAL_TABLET | Freq: Every day | ORAL | Status: DC

## 2023-11-10 MED ORDER — TAMSULOSIN HCL 0.4 MG PO CAPS
0.4000 mg | ORAL_CAPSULE | Freq: Every day | ORAL | Status: DC
  Administered 2023-11-10 – 2023-11-15 (×6): 0.4 mg via ORAL
  Filled 2023-11-10 (×6): qty 1

## 2023-11-10 NOTE — ED Notes (Signed)
Pt transitioned to hospital bed. Pt requested to stand and sit with staff assistance. Pt incredibly weak in bilateral legs and required significant assistance x2 staff members to stand and pivot.  Medication rec and ordered and completed, home medication to be administered.

## 2023-11-10 NOTE — ED Provider Notes (Signed)
Patient still pending the MRI results.  Due to his implantable devices.  They were not able to get that done today.  They are planning to do it in the morning.  Patient will stay here overnight till then.   Vanetta Mulders, MD 11/10/23 2156

## 2023-11-10 NOTE — ED Provider Notes (Signed)
  Physical Exam  BP (!) 155/89 (BP Location: Left Arm)   Pulse 72   Temp 98 F (36.7 C) (Oral)   Resp 16   SpO2 95%   Physical Exam  Procedures  Procedures  ED Course / MDM   Clinical Course as of 11/10/23 1513  Mon Nov 09, 2023  1537 Labs at baseline. Patient signed out to Dr. Karene Fry pending [facility], likely will need MRI if no obvious explanation of weakness on CT. [VK]    Clinical Course User Index [VK] Rexford Maus, DO   Medical Decision Making Amount and/or Complexity of Data Reviewed Labs: ordered. Radiology: ordered.  Risk OTC drugs. Prescription drug management.   72 year old male here today for dizziness.  Patient signed out pending MRI, which was delayed due to a deep brain stimulator.  Patient did receive his MRI of his brain, we will have to go back later for his MRI of his lumbar spine.  Patient signed out to Dr. Deretha Emory MRIs, likely being placed in boarder status.       Anders Simmonds T, DO 11/10/23 1514

## 2023-11-10 NOTE — ED Notes (Signed)
Patient transported to MRI 

## 2023-11-10 NOTE — ED Notes (Signed)
Per MRI, unable to complete scan due to internal device battery being dead. Pt returned to room for device to charge and MRI will now have to be completed at 6am tomorrow morning.

## 2023-11-11 ENCOUNTER — Encounter (HOSPITAL_COMMUNITY): Payer: Self-pay | Admitting: Internal Medicine

## 2023-11-11 ENCOUNTER — Observation Stay (HOSPITAL_COMMUNITY): Payer: No Typology Code available for payment source

## 2023-11-11 ENCOUNTER — Ambulatory Visit: Payer: Medicare PPO | Admitting: Physical Therapy

## 2023-11-11 DIAGNOSIS — E1122 Type 2 diabetes mellitus with diabetic chronic kidney disease: Secondary | ICD-10-CM | POA: Diagnosis present

## 2023-11-11 DIAGNOSIS — M47816 Spondylosis without myelopathy or radiculopathy, lumbar region: Secondary | ICD-10-CM | POA: Diagnosis not present

## 2023-11-11 DIAGNOSIS — I358 Other nonrheumatic aortic valve disorders: Secondary | ICD-10-CM | POA: Diagnosis present

## 2023-11-11 DIAGNOSIS — K219 Gastro-esophageal reflux disease without esophagitis: Secondary | ICD-10-CM | POA: Diagnosis present

## 2023-11-11 DIAGNOSIS — W06XXXA Fall from bed, initial encounter: Secondary | ICD-10-CM | POA: Diagnosis present

## 2023-11-11 DIAGNOSIS — N1832 Chronic kidney disease, stage 3b: Secondary | ICD-10-CM

## 2023-11-11 DIAGNOSIS — G250 Essential tremor: Secondary | ICD-10-CM

## 2023-11-11 DIAGNOSIS — N1831 Chronic kidney disease, stage 3a: Secondary | ICD-10-CM | POA: Diagnosis not present

## 2023-11-11 DIAGNOSIS — R29810 Facial weakness: Secondary | ICD-10-CM | POA: Diagnosis present

## 2023-11-11 DIAGNOSIS — E1169 Type 2 diabetes mellitus with other specified complication: Secondary | ICD-10-CM | POA: Diagnosis not present

## 2023-11-11 DIAGNOSIS — E1142 Type 2 diabetes mellitus with diabetic polyneuropathy: Secondary | ICD-10-CM | POA: Diagnosis not present

## 2023-11-11 DIAGNOSIS — M51379 Other intervertebral disc degeneration, lumbosacral region without mention of lumbar back pain or lower extremity pain: Secondary | ICD-10-CM | POA: Diagnosis not present

## 2023-11-11 DIAGNOSIS — R471 Dysarthria and anarthria: Secondary | ICD-10-CM | POA: Diagnosis present

## 2023-11-11 DIAGNOSIS — R531 Weakness: Secondary | ICD-10-CM | POA: Diagnosis present

## 2023-11-11 DIAGNOSIS — I613 Nontraumatic intracerebral hemorrhage in brain stem: Secondary | ICD-10-CM | POA: Diagnosis not present

## 2023-11-11 DIAGNOSIS — N400 Enlarged prostate without lower urinary tract symptoms: Secondary | ICD-10-CM | POA: Diagnosis present

## 2023-11-11 DIAGNOSIS — Z86711 Personal history of pulmonary embolism: Secondary | ICD-10-CM

## 2023-11-11 DIAGNOSIS — G4733 Obstructive sleep apnea (adult) (pediatric): Secondary | ICD-10-CM | POA: Diagnosis present

## 2023-11-11 DIAGNOSIS — R29818 Other symptoms and signs involving the nervous system: Secondary | ICD-10-CM | POA: Diagnosis not present

## 2023-11-11 DIAGNOSIS — E663 Overweight: Secondary | ICD-10-CM | POA: Diagnosis not present

## 2023-11-11 DIAGNOSIS — I3139 Other pericardial effusion (noninflammatory): Secondary | ICD-10-CM | POA: Diagnosis present

## 2023-11-11 DIAGNOSIS — Z794 Long term (current) use of insulin: Secondary | ICD-10-CM | POA: Diagnosis not present

## 2023-11-11 DIAGNOSIS — G8194 Hemiplegia, unspecified affecting left nondominant side: Secondary | ICD-10-CM | POA: Diagnosis present

## 2023-11-11 DIAGNOSIS — Z96652 Presence of left artificial knee joint: Secondary | ICD-10-CM | POA: Diagnosis present

## 2023-11-11 DIAGNOSIS — M109 Gout, unspecified: Secondary | ICD-10-CM | POA: Diagnosis present

## 2023-11-11 DIAGNOSIS — E785 Hyperlipidemia, unspecified: Secondary | ICD-10-CM | POA: Diagnosis present

## 2023-11-11 DIAGNOSIS — R29704 NIHSS score 4: Secondary | ICD-10-CM | POA: Diagnosis present

## 2023-11-11 DIAGNOSIS — I82403 Acute embolism and thrombosis of unspecified deep veins of lower extremity, bilateral: Secondary | ICD-10-CM | POA: Diagnosis present

## 2023-11-11 DIAGNOSIS — E669 Obesity, unspecified: Secondary | ICD-10-CM | POA: Diagnosis present

## 2023-11-11 DIAGNOSIS — R29898 Other symptoms and signs involving the musculoskeletal system: Secondary | ICD-10-CM | POA: Diagnosis not present

## 2023-11-11 DIAGNOSIS — I10 Essential (primary) hypertension: Secondary | ICD-10-CM

## 2023-11-11 DIAGNOSIS — I6389 Other cerebral infarction: Secondary | ICD-10-CM | POA: Diagnosis not present

## 2023-11-11 DIAGNOSIS — I6300 Cerebral infarction due to thrombosis of unspecified precerebral artery: Secondary | ICD-10-CM | POA: Diagnosis not present

## 2023-11-11 DIAGNOSIS — Q2112 Patent foramen ovale: Secondary | ICD-10-CM | POA: Diagnosis not present

## 2023-11-11 DIAGNOSIS — R4701 Aphasia: Secondary | ICD-10-CM | POA: Diagnosis present

## 2023-11-11 DIAGNOSIS — I129 Hypertensive chronic kidney disease with stage 1 through stage 4 chronic kidney disease, or unspecified chronic kidney disease: Secondary | ICD-10-CM | POA: Diagnosis present

## 2023-11-11 DIAGNOSIS — I6381 Other cerebral infarction due to occlusion or stenosis of small artery: Secondary | ICD-10-CM | POA: Diagnosis present

## 2023-11-11 DIAGNOSIS — I422 Other hypertrophic cardiomyopathy: Secondary | ICD-10-CM | POA: Diagnosis present

## 2023-11-11 DIAGNOSIS — I63512 Cerebral infarction due to unspecified occlusion or stenosis of left middle cerebral artery: Secondary | ICD-10-CM | POA: Diagnosis not present

## 2023-11-11 DIAGNOSIS — I639 Cerebral infarction, unspecified: Secondary | ICD-10-CM

## 2023-11-11 DIAGNOSIS — Y92003 Bedroom of unspecified non-institutional (private) residence as the place of occurrence of the external cause: Secondary | ICD-10-CM | POA: Diagnosis not present

## 2023-11-11 DIAGNOSIS — I350 Nonrheumatic aortic (valve) stenosis: Secondary | ICD-10-CM | POA: Diagnosis not present

## 2023-11-11 DIAGNOSIS — G8191 Hemiplegia, unspecified affecting right dominant side: Secondary | ICD-10-CM | POA: Diagnosis present

## 2023-11-11 DIAGNOSIS — E119 Type 2 diabetes mellitus without complications: Secondary | ICD-10-CM | POA: Diagnosis not present

## 2023-11-11 DIAGNOSIS — M5106 Intervertebral disc disorders with myelopathy, lumbar region: Secondary | ICD-10-CM | POA: Diagnosis not present

## 2023-11-11 DIAGNOSIS — M48061 Spinal stenosis, lumbar region without neurogenic claudication: Secondary | ICD-10-CM | POA: Diagnosis not present

## 2023-11-11 LAB — GLUCOSE, CAPILLARY
Glucose-Capillary: 173 mg/dL — ABNORMAL HIGH (ref 70–99)
Glucose-Capillary: 165 mg/dL — ABNORMAL HIGH (ref 70–99)
Glucose-Capillary: 170 mg/dL — ABNORMAL HIGH (ref 70–99)
Glucose-Capillary: 166 mg/dL — ABNORMAL HIGH (ref 70–99)
Glucose-Capillary: 179 mg/dL — ABNORMAL HIGH (ref 70–99)

## 2023-11-11 MED ORDER — INSULIN GLARGINE-YFGN 100 UNIT/ML ~~LOC~~ SOLN
35.0000 [IU] | Freq: Every day | SUBCUTANEOUS | Status: DC
  Administered 2023-11-11 – 2023-11-16 (×5): 35 [IU] via SUBCUTANEOUS
  Filled 2023-11-11 (×6): qty 0.35

## 2023-11-11 MED ORDER — ACETAMINOPHEN 325 MG PO TABS
650.0000 mg | ORAL_TABLET | Freq: Four times a day (QID) | ORAL | Status: DC | PRN
  Administered 2023-11-14: 650 mg via ORAL
  Filled 2023-11-11: qty 2

## 2023-11-11 MED ORDER — STROKE: EARLY STAGES OF RECOVERY BOOK
Freq: Once | Status: AC
  Filled 2023-11-11: qty 1

## 2023-11-11 MED ORDER — INSULIN ASPART 100 UNIT/ML IJ SOLN: 0.0000 [IU] | Freq: Every day | INTRAMUSCULAR | Status: DC

## 2023-11-11 MED ORDER — ONDANSETRON HCL 4 MG/2ML IJ SOLN: 4.0000 mg | Freq: Four times a day (QID) | INTRAMUSCULAR | Status: DC | PRN

## 2023-11-11 MED ORDER — INSULIN ASPART 100 UNIT/ML IJ SOLN: 0.0000 [IU] | INTRAMUSCULAR | Status: DC

## 2023-11-11 MED ORDER — INSULIN ASPART 100 UNIT/ML IJ SOLN
0.0000 [IU] | Freq: Three times a day (TID) | INTRAMUSCULAR | Status: DC
Start: 2023-11-11 — End: 2023-11-16
  Administered 2023-11-11 (×2): 3 [IU] via SUBCUTANEOUS
  Administered 2023-11-12 (×2): 2 [IU] via SUBCUTANEOUS
  Administered 2023-11-12: 3 [IU] via SUBCUTANEOUS
  Administered 2023-11-13: 2 [IU] via SUBCUTANEOUS
  Administered 2023-11-13: 8 [IU] via SUBCUTANEOUS
  Administered 2023-11-14 – 2023-11-16 (×8): 3 [IU] via SUBCUTANEOUS

## 2023-11-11 MED ORDER — ENSURE ENLIVE PO LIQD
237.0000 mL | Freq: Two times a day (BID) | ORAL | Status: DC
  Administered 2023-11-11 – 2023-11-16 (×7): 237 mL via ORAL

## 2023-11-11 MED ORDER — ENOXAPARIN SODIUM 40 MG/0.4ML IJ SOSY
40.0000 mg | PREFILLED_SYRINGE | INTRAMUSCULAR | Status: DC
Start: 2023-11-11 — End: 2023-11-15
  Administered 2023-11-11 – 2023-11-14 (×4): 40 mg via SUBCUTANEOUS
  Filled 2023-11-11 (×5): qty 0.4

## 2023-11-11 MED ORDER — INSULIN GLARGINE-YFGN 100 UNIT/ML ~~LOC~~ SOLN: 35.0000 [IU] | Freq: Two times a day (BID) | SUBCUTANEOUS | Status: DC

## 2023-11-11 MED ORDER — ONDANSETRON HCL 4 MG PO TABS
4.0000 mg | ORAL_TABLET | Freq: Four times a day (QID) | ORAL | Status: DC | PRN
  Administered 2023-11-11 – 2023-11-15 (×2): 4 mg via ORAL
  Filled 2023-11-11 (×2): qty 1

## 2023-11-11 MED ORDER — ACETAMINOPHEN 650 MG RE SUPP: 650.0000 mg | Freq: Four times a day (QID) | RECTAL | Status: DC | PRN

## 2023-11-11 NOTE — Progress Notes (Signed)
PROGRESS NOTE    Joshua [name]  WUJ:[id number] DOB: [birth year] DOA: 11/09/2023 PCP: Creola Corn, MD  Subjective: Pt seen and examined. Pt lost his blue magnetic that he uses to deactivate his deep brain stimulator in the MRI machine room. I called down to MRI desk and they do not have it. I went down to ER and check ER room 9. Not found there. I looked in security office and did not find it.  I met with Pt and wife.  Discussed his difficult situation with having recurrent strokes. One while he was on systemic anticoagulation with elquis and ASA 81 mg(07-2023 admission).  And now with his new CVA while on ASA/Plavix.  Wife states she cannot physically manage pt due to his size. He is 6 feet 4 inches tall.  I suggested CIR but pt later saw OT and told OT that he wanted to go to SNF.   Hospital Course: HPI: Joshua [name] is a 72 y.o. male with medical history significant of prior CVA, HTN, DM, CKD, essential tremor with deep brain stimulator, prior PE no longer on chronic AC it appears.   Pt in to ED on 12/6 with onset of BLE numbness and weakness.   The patient states when he woke up 12/15 morning he was walking to the kitchen when the backs of his legs felt numb bilaterally.   He states then around 4 PM his legs started to feel weak, worse on the right than the left. He states that he was going to the bathroom and nearly fell but was able to get himself out of the bathroom and into the bed before falling. He states that when he woke up 12/16 AM he tried to get out of bed and slipped from sitting on the edge of the bed to the ground.   He denies hitting his head. He states that he is legs felt too weak and numb to support him. He denies any associated headache or back pain. He denies any dysuria or hematuria or urinary incontinence or retention. He denies any saddle anesthesia. He states that prior to yesterday he was feeling like his normal self without any recent fevers, nausea, vomiting  or diarrhea.   ED work up initially was negative AM of 12/17.  Plan was for patient to get MRI of head, L spine, and T spine given BLE weakness during day on 12/17, had to be done during day due to brain stimulator needing to be re-programed to MRI compatible mode.   Apparently however, when they tried to get MRI: "Per MRI, unable to complete scan due to internal device battery being dead. Pt returned to room for device to charge and MRI will now have to be completed at 6am tomorrow morning."  Significant Events: Admitted 11/09/2023 for generalized weakness   Significant Labs: Admission WBC 7.1 HgB 11.5, BUN 38, scr 2.02  Significant Imaging Studies: CT head shows No acute intracranial process  MRI brain shows subacute CVA of posterior left frontal lobe   Antibiotic Therapy: Anti-infectives (From admission, onward)    None       Procedures:   Consultants:     Assessment and Plan: * CVA (cerebral vascular accident) (HCC) 11-11-2023 unfortunately, pt's MRI brain showed subacute CVA of left posterior frontal lobe. Pt was taking ASA/Plavix when he had his CVA this time.  In September 2024, pt had another while he was on Eliquis and Asa 81 mg daily.  Pt intolerant to statins. Already on  zetia and fenofibrate. His LDL in September was 70. No history of afib. But he had a CVA while on Eliquis in September 2024.  Pt is in a very tough situation. He continues to have recurrent CVA despite DAPT. He had a CVA in 07-2023 while on systemic anticoagulation and ASA 81 mg daily.  Will check limited echo to make sure he doesn't have thrombus, but I doubt it.  Initially I thought that CIR may be a way for him to get rehab. Pt is a large man. About 6 feet 4 inches. Wife states she cannot physically lift him at home.  OT has messaged me that pt wants to go to SNF now.  Generalized weakness 11-11-2023 worsening since Sunday night due to leg weakness. Likely due to CVA.  Essential tremor 11-11-2023  Resume deep brain stimulator treatment once this is charged. Pt states this has been less and less effective since he had a CVA in the area where his deep brain stimulator is attached.  CKD stage 3b, GFR 30-44 ml/min (HCC) - baseline SCr 2.0 11-11-2023 stable. Will repeat CMP in AM.  History of pulmonary embolism 11-11-2023 Looks like he's no longer on long term AC at this point in time other than ASA + Plavix.  Overweight (BMI 25.0-29.9) BMI 29.58  Hyperlipidemia 11-11-23 pt is statin intolerant. On zetia and fenofibrate. Last LDL in 07-2023 was 70. Will resend LDL. Check lipoprotein A.  Essential hypertension 11-11-2023 stable. BP stable.  OSA on CPAP 11-11-2023 Cont CPAP per home settings  Diabetes mellitus type 2 in obese (HCC) 11-11-2023 continue with lantus and SSI. CBG acceptable ranges.   DVT prophylaxis: enoxaparin (LOVENOX) injection 40 mg Start: 11/11/23 1000    Code Status: Full Code Family Communication: discussed with pt and wife at bedside Disposition Plan: SNF Reason for continuing need for hospitalization: needs to finish cva workup. Check echo. PT/OT consults.  Objective: Vitals:   11/11/23 0541 11/11/23 1029 11/11/23 1035 11/11/23 1437  BP: (!) 146/83 128/88 128/88 (!) 124/94  Pulse: 89 96 96   Resp: 16 18    Temp: 98.3 F (36.8 C) 98 F (36.7 C)    TempSrc: Oral Oral    SpO2: 97% 99%    Weight:      Height:        Intake/Output Summary (Last 24 hours) at 11/11/2023 1522 Last data filed at 11/11/2023 0732 Gross per 24 hour  Intake --  Output 100 ml  Net -100 ml   Filed Weights   11/10/23 2137  Weight: 110.2 kg    Examination:  Physical Exam Vitals and nursing note reviewed.  Constitutional:      General: He is not in acute distress.    Appearance: He is not toxic-appearing or diaphoretic.  HENT:     Head: Normocephalic and atraumatic.     Nose: Nose normal.  Eyes:     General: No scleral icterus. Cardiovascular:     Rate and  Rhythm: Normal rate and regular rhythm.  Pulmonary:     Effort: Pulmonary effort is normal.     Breath sounds: Normal breath sounds.  Abdominal:     General: Bowel sounds are normal.     Palpations: Abdomen is soft.  Skin:    General: Skin is warm and dry.     Capillary Refill: Capillary refill takes less than 2 seconds.  Neurological:     Mental Status: He is alert and oriented to person, place, and time.  Comments: Mild dysarthria. +resting tremors. Most notable in right hand.     Data Reviewed: I have personally reviewed following labs and imaging studies  CBC: Recent Labs  Lab 11/09/23 1406  WBC 7.1  NEUTROABS 3.8  HGB 11.5*  HCT 35.9*  MCV 92.1  PLT 276   Basic Metabolic Panel: Recent Labs  Lab 11/09/23 1406  NA 142  K 4.2  CL 109  CO2 23  GLUCOSE 90  BUN 38*  CREATININE 2.02*  CALCIUM 9.3   GFR: Estimated Creatinine Clearance: 43.7 mL/min (A) (by C-G formula based on SCr of 2.02 mg/dL (H)). Liver Function Tests: Recent Labs  Lab 11/09/23 1406  AST 35  ALT 30  ALKPHOS 101  BILITOT 0.7  PROT 6.6  ALBUMIN 3.3*   BNP (last 3 results) Recent Labs    06/25/23 1000  BNP 27.5   CBG: Recent Labs  Lab 11/09/23 1405 11/10/23 1109 11/11/23 0629 11/11/23 1029 11/11/23 1440  GLUCAP 87 108* 173* 165* 170*   Radiology Studies: MR LUMBAR SPINE WO CONTRAST Result Date: 11/11/2023 CLINICAL DATA:  Myelopathy, acute, lumbar spine EXAM: MRI LUMBAR SPINE WITHOUT CONTRAST TECHNIQUE: Multiplanar, multisequence MR imaging of the lumbar spine was performed. No intravenous contrast was administered. COMPARISON:  None Available. FINDINGS: Segmentation:  Standard. Alignment:  Physiologic. Vertebrae:  No fracture, evidence of discitis, or bone lesion. Conus medullaris and cauda equina: Conus extends to the L1 level. Conus and cauda equina appear normal. Paraspinal and other soft tissues: Negative. Disc levels: Limitations: Note that axial sequences were not  acquired this was an abbreviated exam to comply with patient's deep brain stimulator. T12-L1: No significant disc bulge. No neural foraminal narrowing. No evidence of high-grade spinal canal stenosis. L1-L2: Minimal disc bulge. No evidence of high-grade spinal canal stenosis. No neural foraminal stenosis. L2-L3: Minimal disc bulge. No evidence of high-grade spinal canal stenosis. Mild bilateral neural foraminal narrowing. L3-L4: No significant disc bulge. No spinal canal narrowing. No neural foraminal narrowing. Mild bilateral facet degenerative change. L4-L5: No significant disc bulge. No evidence of high-grade spinal canal stenosis. Mild bilateral neural foraminal narrowing. L5-S1: Minimal disc bulge. Mild bilateral facet degenerative change. No spinal canal narrowing. Mild bilateral neural foraminal narrowing, right-greater-than-left. IMPRESSION: Within the limitations above, mild multilevel degenerative changes without evidence of high-grade spinal canal or neural foraminal stenosis. Electronically Signed   By: Lorenza Cambridge M.D.   On: 11/11/2023 10:54   MR BRAIN WO CONTRAST Result Date: 11/11/2023 CLINICAL DATA:  Neuro deficit, acute, stroke suspected EXAM: MRI HEAD WITHOUT CONTRAST TECHNIQUE: Multiplanar, multiecho pulse sequences of the brain and surrounding structures were obtained without intravenous contrast. COMPARISON:  Brain MR 07/31/2023, CT head 11/09/2023 FINDINGS: Brain: Left-sided DBS lead in place. Compared to prior exam there is a new T2 hyperintense lesion in the posterior left frontal lobe (series 6, image 24), which is favored to represent a subacute infarct. No evidence of an acute infarct. No mass effect. No acute hemorrhage. No hydrocephalus. No extra-axial fluid collection. There is a background of moderate chronic microvascular ischemic change multiple chronic infarcts in the bilateral corona radiata. Unchanged chronic micro hemorrhage in the ventral pons. Vascular: Normal flow voids.  Skull and upper cervical spine: Normal marrow signal. Sinuses/Orbits: No middle ear or mastoid effusion. Paranasal sinuses are clear. Bilateral lens replacement. Orbits are otherwise unremarkable. Other: None. IMPRESSION: 1. New T2 hyperintense lesion in the posterior left frontal lobe, favored to represent a late subacute infarct. No evidence of an acute infarct. 2.  No acute abnormality. Electronically Signed   By: Lorenza Cambridge M.D.   On: 11/11/2023 10:00   CT Head Wo Contrast Result Date: 11/09/2023 CLINICAL DATA:  Weakness, stroke suspected EXAM: CT HEAD WITHOUT CONTRAST TECHNIQUE: Contiguous axial images were obtained from the base of the skull through the vertex without intravenous contrast. RADIATION DOSE REDUCTION: This exam was performed according to the departmental dose-optimization program which includes automated exposure control, adjustment of the mA and/or kV according to patient size and/or use of iterative reconstruction technique. COMPARISON:  07/30/2023 CT head FINDINGS: Brain: No evidence of acute infarction, hemorrhage, mass, mass effect, or midline shift. No hydrocephalus or extra-axial fluid collection. Redemonstrated left-sided DBS electrode foot, in unchanged position. Remote lacunar infarct in the right basal ganglia. Periventricular white matter changes, likely the sequela of chronic small vessel ischemic disease. Vascular: No hyperdense vessel. Skull: Negative for fracture or focal lesion. Left parietal burr hole. Sinuses/Orbits: No acute finding. Redemonstrated retained metallic density anterior to the left globe. Other: The mastoid air cells are well aerated. IMPRESSION: No acute intracranial process. Electronically Signed   By: Wiliam Ke M.D.   On: 11/09/2023 18:10    Scheduled Meds:  amLODipine  2.5 mg Oral Daily   aspirin EC  81 mg Oral Daily   atorvastatin  40 mg Oral q1800   clopidogrel  75 mg Oral Daily   DULoxetine  60 mg Oral BID   enoxaparin (LOVENOX)  injection  40 mg Subcutaneous Q24H   ezetimibe  10 mg Oral Daily   feeding supplement  237 mL Oral BID BM   finasteride  5 mg Oral Daily   insulin aspart  0-15 Units Subcutaneous TID WC   insulin aspart  0-5 Units Subcutaneous QHS   insulin glargine-yfgn  35 Units Subcutaneous Daily   losartan  100 mg Oral Daily   metoprolol succinate  50 mg Oral Daily   mirtazapine  7.5 mg Oral QHS   tamsulosin  0.4 mg Oral QPC supper   Continuous Infusions:   LOS: 0 days   Time spent: 40 minutes  Carollee Herter, DO  [facility]  11/11/2023, 3:22 PM

## 2023-11-11 NOTE — Assessment & Plan Note (Addendum)
11-11-2023 worsening since Sunday night due to leg weakness. Likely due to CVA.  11-12-2023 pt wants to go to SNF at discharge. Wife does not want CIR. CM aware.  11-13-2023 CM looking into SNF placement. 11-14-2023 continue to search for SNF placement. Pt states he will work with PT/OT when asked. 11-15-2023 CM continuing bed search

## 2023-11-11 NOTE — Evaluation (Signed)
Occupational Therapy Evaluation Patient Details Name: Joshua [name] MRN: [id number] DOB: [birth year] Today's Date: 11/11/2023   History of Present Illness 72 y.o. male admitted 12/16  with onset of BLE numbness and weakness. PMHx: prior CVA, HTN, DM, CKD, essential tremor with deep brain stimulator, prior PE.   Clinical Impression   Pt c/o fatigue and weakness. Pt lives with wife in one level house, 5 steps to enter, has frequent falls and wife is unable to assist with walking or transfers. Pt PLOF uses rollator, wife assists with LB dressing and IADLs. Pt currently below baseline, was not able to stand from recliner without max A x2 support, was not able to maintain balance with RW or R platform walker with x2 max A. Pt required Stedy for transfer, min A x2 with Stedy. Pt very fatigued, min A for bed mobility, mod A for hygiene with BSC. Pt would benefit greatly from continued acute OT, DC to postacute rehab <3hrs/day recommended.        If plan is discharge home, recommend the following: Two people to help with walking and/or transfers;A lot of help with bathing/dressing/bathroom;Assistance with cooking/housework;Assist for transportation;Help with stairs or ramp for entrance    Functional Status Assessment  Patient has had a recent decline in their functional status and demonstrates the ability to make significant improvements in function in a reasonable and predictable amount of time.  Equipment Recommendations  Other (comment) (defer)    Recommendations for Other Services       Precautions / Restrictions Precautions Precautions: Fall Precaution Comments: Recent Rt distal humerus fracture fx (5 weeks ago) Pt states use as tolerated; wife reports told not to push/pull but okay for ROM exercises. Required Braces or Orthoses: Other Brace Other Brace: elbow spint as needed Restrictions Weight Bearing Restrictions Per Provider Order: No Other Position/Activity Restrictions: Has Rt  distal humerus fracture      Mobility Bed Mobility Overal bed mobility: Needs Assistance Bed Mobility: Sit to Supine       Sit to supine: Min assist   General bed mobility comments: min a for BLEs into bed    Transfers Overall transfer level: Needs assistance Equipment used: Ambulation equipment used Transfers: Sit to/from Stand, Bed to chair/wheelchair/BSC Sit to Stand: Max assist, +2 physical assistance           General transfer comment: Pt requires x2 assist to stand from recliner, not able to maintain balance with R platform walker or RW. Pt used Stedy for transfer back to bed. Transfer via Lift Equipment: Stedy    Balance Overall balance assessment: Needs assistance Sitting-balance support: No upper extremity supported, Feet supported Sitting balance-Leahy Scale: Fair Sitting balance - Comments: able to sit in recliner without UE support   Standing balance support: During functional activity, Reliant on assistive device for balance, Bilateral upper extremity supported Standing balance-Leahy Scale: Poor Standing balance comment: not able to maintain balance with R platform walker, or RW, used Stedy for standing/transfer.                           ADL either performed or assessed with clinical judgement   ADL Overall ADL's : Needs assistance/impaired Eating/Feeding: Independent   Grooming: Set up;Sitting   Upper Body Bathing: Set up;Sitting   Lower Body Bathing: Moderate assistance;Sitting/lateral leans   Upper Body Dressing : Set up;Sitting   Lower Body Dressing: Moderate assistance;Sitting/lateral leans   Toilet Transfer: BSC/3in1;Maximal assistance;+2 for physical assistance Antony Salmon)  General ADL Comments: Pt unable to stand from recliner with x1 assist, requires max A x2 from low surfaces for STS. Pt requires Stedy for transfer, not able to take steps or maintain balance with RW. Pt at baseline needs help with LB dressing. Mild  difficulty with FM tasks due to B hand tremors/stiffness     Vision Baseline Vision/History: 1 Wears glasses Ability to See in Adequate Light: 1 Impaired Patient Visual Report: Other (comment);Central vision impairment (hx of L stroke with L central vision loss)       Perception         Praxis         Pertinent Vitals/Pain Pain Assessment Pain Assessment: No/denies pain     Extremity/Trunk Assessment Upper Extremity Assessment Upper Extremity Assessment: Generalized weakness;RUE deficits/detail;LUE deficits/detail RUE Deficits / Details: B hands/fingers overall functional, mild tremors and stiffness which make FM activities difficult. RUE Sensation: WNL RUE Coordination: decreased fine motor LUE Deficits / Details: B hands/fingers overall functional, mild tremors and stiffness which make FM activities difficult. LUE Sensation: WNL LUE Coordination: decreased fine motor   Lower Extremity Assessment Lower Extremity Assessment: Defer to PT evaluation RLE Deficits / Details: hip flexion 4/5; knee extension 4+/5, knee flexion 4/5, ankle DF 4/5, hallux extension 3+/5, ankle eversion 3+/5, hip IR 3/5, hip ER 3+/5. RLE Sensation: decreased light touch;decreased proprioception;history of peripheral neuropathy (Distal to knee. Reports acutely worse.) RLE Coordination:  (Heel knee shin, with limited ROM but able to track accurately in shortened range.) LLE Deficits / Details: Hip flexion 4+/5, knee extension 4-/5, knee flexion 4/5, ankle DF 4-/5, ankle eversion 4/5, hip IR 3-/5, hip ER 4/5 LLE Sensation: decreased light touch;decreased proprioception;history of peripheral neuropathy (distal to knee, reports acutely worse.) LLE Coordination:  (abnormal heel knee shin test and limited ROM with testing.)       Communication Communication Communication: Difficulty communicating thoughts/reduced clarity of speech   Cognition Arousal: Alert Behavior During Therapy: WFL for tasks  assessed/performed Overall Cognitive Status: Within Functional Limits for tasks assessed                                       General Comments  Wife present, supportive, does not feel she can care for him due to multiple falls at home    Exercises     Shoulder Instructions      Home Living Family/patient expects to be discharged to:: Private residence Living Arrangements: Spouse/significant other Available Help at Discharge: Family;Available 24 hours/day Type of Home: House Home Access: Stairs to enter [employer] of Steps: 4-5 Entrance Stairs-Rails: Right Home Layout: One level     Bathroom Shower/Tub: Producer, television/film/video: Handicapped height Bathroom Accessibility: Yes   Home Equipment: Shower seat - built in;Grab bars - tub/shower;Hand held shower head;Cane - single point;Rollator (4 wheels);Rolling Walker (2 wheels)          Prior Functioning/Environment Prior Level of Function : Independent/Modified Independent;History of Falls (last six months)             Mobility Comments: Modified Independent with rollator ADLs Comments: Min A with lower body ADL and buttons due to tremors        OT Problem List:        OT Treatment/Interventions: Self-care/ADL training;Therapeutic exercise;Energy conservation;DME and/or AE instruction;Manual therapy;Patient/family education;Balance training    OT Goals(Current goals can be found in the care plan  section) Acute Rehab OT Goals Patient Stated Goal: to improve strength OT Goal Formulation: With patient/family Time For Goal Achievement: 11/25/23 Potential to Achieve Goals: Good  OT Frequency: Min 1X/week    Co-evaluation              AM-PAC OT "6 Clicks" Daily Activity     Outcome Measure Help from another person eating meals?: None Help from another person taking care of personal grooming?: A Little Help from another person toileting, which includes using toliet,  bedpan, or urinal?: A Lot Help from another person bathing (including washing, rinsing, drying)?: A Lot Help from another person to put on and taking off regular upper body clothing?: A Little Help from another person to put on and taking off regular lower body clothing?: A Lot 6 Click Score: 16   End of Session Equipment Utilized During Treatment: Gait belt;Other (comment) Antony Salmon) Nurse Communication: Mobility status;Need for lift equipment  Activity Tolerance: Patient tolerated treatment well Patient left: in bed;with call bell/phone within reach;with nursing/sitter in room;with family/visitor present  OT Visit Diagnosis: Unsteadiness on feet (R26.81);Other abnormalities of gait and mobility (R26.89);Muscle weakness (generalized) (M62.81);Repeated falls (R29.6);History of falling (Z91.81)                Time: 1345-1441 OT Time Calculation (min): 56 min Charges:  OT General Charges $OT Visit: 1 Visit OT Evaluation $OT Eval Moderate Complexity: 1 Mod OT Treatments $Self Care/Home Management : 23-37 mins $Therapeutic Activity: 8-22 mins  Erron Wengert, OTR/L   Alexis Goodell 11/11/2023, 2:45 PM

## 2023-11-11 NOTE — Assessment & Plan Note (Addendum)
11-11-2023 Resume deep brain stimulator treatment once this is charged. Pt states this has been less and less effective since he had a CVA in the area where his deep brain stimulator is attached.  11-12-2023, 11-13-2023, 11-14-2023 stable

## 2023-11-11 NOTE — Assessment & Plan Note (Addendum)
11-11-2023 baseline Scr 2.0

## 2023-11-11 NOTE — Assessment & Plan Note (Signed)
11-11-23 pt is statin intolerant. On zetia and fenofibrate. Last LDL in 07-2023 was 70. Will resend LDL. Check lipoprotein A. 11-12-2023 LCL 59

## 2023-11-11 NOTE — Hospital Course (Addendum)
HPI: Joshua [name] is a 72 y.o. male with medical history significant of prior CVA, HTN, DM, CKD, essential tremor with deep brain stimulator, prior PE no longer on chronic AC it appears.   Pt in to ED on 12/6 with onset of BLE numbness and weakness.   The patient states when he woke up 12/15 morning he was walking to the kitchen when the backs of his legs felt numb bilaterally.   He states then around 4 PM his legs started to feel weak, worse on the right than the left. He states that he was going to the bathroom and nearly fell but was able to get himself out of the bathroom and into the bed before falling. He states that when he woke up 12/16 AM he tried to get out of bed and slipped from sitting on the edge of the bed to the ground.   He denies hitting his head. He states that he is legs felt too weak and numb to support him. He denies any associated headache or back pain. He denies any dysuria or hematuria or urinary incontinence or retention. He denies any saddle anesthesia. He states that prior to yesterday he was feeling like his normal self without any recent fevers, nausea, vomiting or diarrhea.   ED work up initially was negative AM of 12/17.  Plan was for patient to get MRI of head, L spine, and T spine given BLE weakness during day on 12/17, had to be done during day due to brain stimulator needing to be re-programed to MRI compatible mode.   Apparently however, when they tried to get MRI: "Per MRI, unable to complete scan due to internal device battery being dead. Pt returned to room for device to charge and MRI will now have to be completed at 6am tomorrow morning."  Significant Events: Admitted 11/09/2023 for generalized weakness   Significant Labs: Admission WBC 7.1 HgB 11.5, BUN 38, scr 2.02  Significant Imaging Studies: CT head shows No acute intracranial process  MRI brain shows subacute CVA of posterior left frontal lobe  TEE shows small PFO, not seen on prior echos or  prior TEE Transcranial doppler study with bubble study showed very small clinically insignificant PFO.  Antibiotic Therapy: Anti-infectives (From admission, onward)    None       Procedures:   Consultants:

## 2023-11-11 NOTE — ED Notes (Signed)
ED TO INPATIENT HANDOFF REPORT  ED Nurse Name and Phone #: Ugo Thoma  S Name/Age/Gender Joshua [name] 72 y.o. male Room/Bed: 009C/009C  Code Status   Code Status: Full Code  Home/SNF/Other Home Patient oriented to: self, place, time, and situation Is this baseline? Yes   Triage Complete: Triage complete  Chief Complaint Generalized weakness [R53.1]  Triage Note Patient BIB [facility] from home for bilateral leg weakness starting yesterday afternoon. Patient c/o "strange sensation" from left knee down. Hx of stroke, LKW yesterday morning. A&Ox4, no other unilateral deficits, edema noted in both legs this morning. VSS, 146/88, HR 82, RR 18, 98% RA. CBG 111   Allergies Allergies  Allergen Reactions   Melatonin Other (See Comments)    dizzy    Level of Care/Admitting Diagnosis ED Disposition     ED Disposition  Admit   Condition  --   Comment  Hospital Area: [facility] [[id number]]  Level of Care: Med-Surg [16]  May place patient in observation at [facility] or [facility] if equivalent level of care is available:: No  Covid Evaluation: Asymptomatic - no recent exposure (last 10 days) testing not required  Diagnosis: Generalized weakness [161096]  Admitting Physician: Hillary Bow [[id number]]  Attending Physician: Hillary Bow [[id number]]          B Medical/Surgery History Past Medical History:  Diagnosis Date   Benign essential tremor    Benign positional vertigo    Chronic kidney disease 08/28/2019   CVA (cerebral vascular accident) (HCC)    x2 - L retina, 1 right parietal   Degenerative arthritis    Depression    Diabetes mellitus    DVT (deep venous thrombosis) (HCC) 2018   Dyslipidemia    GERD (gastroesophageal reflux disease)    hiatal hernia   Gout    H/O: vasectomy    Hearing aid worn    b/l   Hx of appendectomy    Hx of tonsillectomy    Hypertension    Hypertrophic cardiomyopathy (HCC)    Ischemic optic neuropathy    on the  left   Melanoma (HCC)    NSVT (nonsustained ventricular tachycardia) (HCC)    1 4 beat run on event monitor in 07/2020   Obesity    OSA on CPAP    setting = 5   Pulmonary emboli (HCC) 2018   PVC's (premature ventricular contractions)    SVT (supraventricular tachycardia) (HCC)    by event monitor   Tremor, essential 06/22/2017   Wears glasses    Past Surgical History:  Procedure Laterality Date   APPENDECTOMY     arthroscopic knee surgery Bilateral    CATARACT EXTRACTION Bilateral    COLONOSCOPY     MINOR PLACEMENT OF FIDUCIAL N/A 06/30/2019   Procedure: Fiducial placement;  Surgeon: Maeola Harman, MD;  Location: [facility] OR;  Service: Neurosurgery;  Laterality: N/A;  Fiducial placement   NASAL SEPTUM SURGERY     PULSE GENERATOR IMPLANT N/A 07/14/2019   Procedure: Left cranial Implanted Pulse Generator and lead extension placement to right chest ;  Surgeon: Maeola Harman, MD;  Location: [facility] OR;  Service: Neurosurgery;  Laterality: N/A;   SUBTHALAMIC STIMULATOR INSERTION Left 07/07/2019   Procedure: LEFT DEEP BRAIN STIMULATOR PLACEMENT;  Surgeon: Maeola Harman, MD;  Location: [facility] OR;  Service: Neurosurgery;  Laterality: Left;   TEE WITHOUT CARDIOVERSION N/A 04/23/2021   Procedure: TRANSESOPHAGEAL ECHOCARDIOGRAM (TEE);  Surgeon: Elease Hashimoto Deloris Ping, MD;  Location: [facility] ENDOSCOPY;  Service: Cardiovascular;  Laterality: N/A;   TONSILLECTOMY     TOTAL KNEE ARTHROPLASTY Left 03/30/2017   Procedure: LEFT TOTAL KNEE ARTHROPLASTY;  Surgeon: Durene Romans, MD;  Location: [facility] ORS;  Service: Orthopedics;  Laterality: Left;   WISDOM TOOTH EXTRACTION       A IV Location/Drains/Wounds Patient Lines/Drains/Airways Status     Active Line/Drains/Airways     None            Intake/Output Last 24 hours  Intake/Output Summary (Last 24 hours) at 11/11/2023 0426 Last data filed at 11/10/2023 1610 Gross per 24 hour  Intake --  Output 375 ml  Net -375 ml    Labs/Imaging Results for orders placed  or performed during the hospital encounter of 11/09/23 (from the past 48 hours)  CBG monitoring, ED     Status: None   Collection Time: 11/09/23  2:05 PM  Result Value Ref Range   Glucose-Capillary 87 70 - 99 mg/dL    Comment: Glucose reference range applies only to samples taken after fasting for at least 8 hours.  Comprehensive metabolic panel     Status: Abnormal   Collection Time: 11/09/23  2:06 PM  Result Value Ref Range   Sodium 142 135 - 145 mmol/L   Potassium 4.2 3.5 - 5.1 mmol/L   Chloride 109 98 - 111 mmol/L   CO2 23 22 - 32 mmol/L   Glucose, Bld 90 70 - 99 mg/dL    Comment: Glucose reference range applies only to samples taken after fasting for at least 8 hours.   BUN 38 (H) 8 - 23 mg/dL   Creatinine, Ser 9.60 (H) 0.61 - 1.24 mg/dL   Calcium 9.3 8.9 - 45.4 mg/dL   Total Protein 6.6 6.5 - 8.1 g/dL   Albumin 3.3 (L) 3.5 - 5.0 g/dL   AST 35 15 - 41 U/L   ALT 30 0 - 44 U/L   Alkaline Phosphatase 101 38 - 126 U/L   Total Bilirubin 0.7 <1.2 mg/dL   GFR, Estimated 34 (L) >60 mL/min    Comment: (NOTE) Calculated using the CKD-EPI Creatinine Equation (2021)    Anion gap 10 5 - 15    Comment: Performed at [facility], [address] [place], [place] [address]  CBC with Differential     Status: Abnormal   Collection Time: 11/09/23  2:06 PM  Result Value Ref Range   WBC 7.1 4.0 - 10.5 K/uL   RBC 3.90 (L) 4.22 - 5.81 MIL/uL   Hemoglobin 11.5 (L) 13.0 - 17.0 g/dL   HCT 91.4 (L) 78.2 - 95.6 %   MCV 92.1 80.0 - 100.0 fL   MCH 29.5 26.0 - 34.0 pg   MCHC 32.0 30.0 - 36.0 g/dL   RDW 21.3 08.6 - 57.8 %   Platelets 276 150 - 400 K/uL   nRBC 0.0 0.0 - 0.2 %   Neutrophils Relative % 53 %   Neutro Abs 3.8 1.7 - 7.7 K/uL   Lymphocytes Relative 35 %   Lymphs Abs 2.5 0.7 - 4.0 K/uL   Monocytes Relative 9 %   Monocytes Absolute 0.6 0.1 - 1.0 K/uL   Eosinophils Relative 2 %   Eosinophils Absolute 0.2 0.0 - 0.5 K/uL   Basophils Relative 1 %   Basophils Absolute 0.1 0.0 - 0.1  K/uL   Immature Granulocytes 0 %   Abs Immature Granulocytes 0.03 0.00 - 0.07 K/uL    Comment: Performed at [facility], [address],  [place], [place] [address]  Urinalysis, w/ Reflex to Culture (Infection Suspected) -Urine, Clean Catch     Status: Abnormal   Collection Time: 11/09/23  5:52 PM  Result Value Ref Range   Specimen Source URINE, CLEAN CATCH    Color, Urine YELLOW YELLOW   APPearance CLEAR CLEAR   Specific Gravity, Urine 1.014 1.005 - 1.030   pH 5.0 5.0 - 8.0   Glucose, UA NEGATIVE NEGATIVE mg/dL   Hgb urine dipstick NEGATIVE NEGATIVE   Bilirubin Urine NEGATIVE NEGATIVE   Ketones, ur NEGATIVE NEGATIVE mg/dL   Protein, ur >=469 (A) NEGATIVE mg/dL   Nitrite NEGATIVE NEGATIVE   Leukocytes,Ua NEGATIVE NEGATIVE   RBC / HPF 6-10 0 - 5 RBC/hpf   WBC, UA 0-5 0 - 5 WBC/hpf    Comment:        Reflex urine culture not performed if WBC <=10, OR if Squamous epithelial cells >5. If Squamous epithelial cells >5 suggest recollection.    Bacteria, UA RARE (A) NONE SEEN   Squamous Epithelial / HPF 0-5 0 - 5 /HPF   Mucus PRESENT    Hyaline Casts, UA PRESENT    Amorphous Crystal PRESENT     Comment: Performed at [facility], [address], [place], [place] [address]  CBG monitoring, ED     Status: Abnormal   Collection Time: 11/10/23 11:09 AM  Result Value Ref Range   Glucose-Capillary 108 (H) 70 - 99 mg/dL    Comment: Glucose reference range applies only to samples taken after fasting for at least 8 hours.   Comment 1 Notify RN    Comment 2 Document in Chart    CT Head Wo Contrast Result Date: 11/09/2023 CLINICAL DATA:  Weakness, stroke suspected EXAM: CT HEAD WITHOUT CONTRAST TECHNIQUE: Contiguous axial images were obtained from the base of the skull through the vertex without intravenous contrast. RADIATION DOSE REDUCTION: This exam was performed according to the departmental dose-optimization program which includes automated exposure control, adjustment of the  mA and/or kV according to patient size and/or use of iterative reconstruction technique. COMPARISON:  07/30/2023 CT head FINDINGS: Brain: No evidence of acute infarction, hemorrhage, mass, mass effect, or midline shift. No hydrocephalus or extra-axial fluid collection. Redemonstrated left-sided DBS electrode foot, in unchanged position. Remote lacunar infarct in the right basal ganglia. Periventricular white matter changes, likely the sequela of chronic small vessel ischemic disease. Vascular: No hyperdense vessel. Skull: Negative for fracture or focal lesion. Left parietal burr hole. Sinuses/Orbits: No acute finding. Redemonstrated retained metallic density anterior to the left globe. Other: The mastoid air cells are well aerated. IMPRESSION: No acute intracranial process. Electronically Signed   By: Wiliam Ke M.D.   On: 11/09/2023 18:10    Pending Labs Unresulted Labs (From admission, onward)    None       Vitals/Pain Today's Vitals   11/10/23 2200 11/11/23 0110 11/11/23 0200 11/11/23 0226  BP: (!) 146/92 (!) 171/98 (!) 155/93   Pulse: (!) 101 (!) 102    Resp: 18 18    Temp: 98.1 F (36.7 C)   (!) 97.4 F (36.3 C)  TempSrc:    Temporal  SpO2: 99% 98%    Weight:      Height:      PainSc:        Isolation Precautions No active isolations  Medications Medications  atorvastatin (LIPITOR) tablet 40 mg (40 mg Oral Given 11/10/23 1854)  metFORMIN (GLUCOPHAGE) tablet 500 mg (500 mg Oral Given 11/10/23 1706)  mirtazapine (REMERON) tablet 7.5 mg (7.5 mg Oral Given 11/10/23 2135)  aspirin EC tablet 81 mg (81 mg Oral Given 11/10/23 1708)  clopidogrel (PLAVIX) tablet 75 mg (75 mg Oral Given 11/10/23 1708)  DULoxetine (CYMBALTA) DR capsule 60 mg (60 mg Oral Given 11/10/23 1853)  ezetimibe (ZETIA) tablet 10 mg (10 mg Oral Given 11/10/23 1708)  finasteride (PROSCAR) tablet 5 mg (5 mg Oral Given 11/10/23 1853)  losartan (COZAAR) tablet 100 mg (100 mg Oral Given 11/10/23 1708)   metoprolol succinate (TOPROL-XL) 24 hr tablet 50 mg (50 mg Oral Given 11/10/23 1928)  tamsulosin (FLOMAX) capsule 0.4 mg (0.4 mg Oral Given 11/10/23 1854)  amLODipine (NORVASC) tablet 2.5 mg (2.5 mg Oral Given 11/10/23 1706)  enoxaparin (LOVENOX) injection 40 mg (has no administration in time range)  acetaminophen (TYLENOL) tablet 650 mg (has no administration in time range)    Or  acetaminophen (TYLENOL) suppository 650 mg (has no administration in time range)  ondansetron (ZOFRAN) tablet 4 mg (has no administration in time range)    Or  ondansetron (ZOFRAN) injection 4 mg (has no administration in time range)  LORazepam (ATIVAN) tablet 1 mg (1 mg Oral Given 11/10/23 1403)    Mobility non-ambulatory     Focused Assessments Neuro Assessment Handoff:  Swallow screen pass? Yes    NIH Stroke Scale  Dizziness Present: No Headache Present: No Interval: Shift assessment Level of Consciousness (1a.)   : Alert, keenly responsive LOC Questions (1b. )   : Answers both questions correctly LOC Commands (1c. )   : Performs both tasks correctly Best Gaze (2. )  : Normal Visual (3. )  : No visual loss Facial Palsy (4. )    : Normal symmetrical movements Motor Arm, Left (5a. )   : No drift Motor Arm, Right (5b. ) : No drift Motor Leg, Left (6a. )  : Drift Motor Leg, Right (6b. ) : Drift Limb Ataxia (7. ): Absent Sensory (8. )  : Normal, no sensory loss Best Language (9. )  : No aphasia Dysarthria (10. ): Normal Extinction/Inattention (11.)   : No Abnormality Complete NIHSS TOTAL: 2     Neuro Assessment: Exceptions to WDL Neuro Checks:   Shift assessment (11/10/23 1130)  Has TPA been given? No If patient is a Neuro Trauma and patient is going to OR before floor call report to 4N Charge nurse: [phone] or [phone]   R Recommendations: See Admitting Provider Note  Report given to:   Additional Notes: baseline ambulatory but with the weakness he is unable to ambulate  safely

## 2023-11-11 NOTE — Consult Note (Signed)
NEUROLOGY CONSULT NOTE   Date of service: November 11, 2023 Patient Name: Joshua [name] MRN:  [id number] DOB:  03-Feb-1951 Chief Complaint: "lower ext weakness" Requesting Provider: Carollee Herter, DO  History of Present Illness  Benedicto A Combee is a 72 y.o. male with hx of DM2, depression, HTN, PE not on AC, prior stroke with baseline L sided weakness, who presents with worsening R leg weakness in addition to his chronic L Leg weakness. He reports getting up in the morning yesterday and was wobbly, off balance and felt like his R leg was going to give away. However, feels weakness has been going on for over a week. He also endorses slurred speech. Reports having stroke back in July of this year and that caused his left side to be weak but this time, its the right side that is weaker. He had MRI Brain w.o contrast which demonstrated posterior L frontal stroke.  LKW: 11/03/23. Modified rankin score: 3-Moderate disability-requires help but walks WITHOUT assistance IV Thrombolysis: not offered, outside window. EVT: not offered, low suspicion for LV  NIHSS components Score: Comment  1a Level of Conscious 0[x]  1[]  2[]  3[]      1b LOC Questions 0[x]  1[]  2[]       1c LOC Commands 0[x]  1[]  2[]       2 Best Gaze 0[x]  1[]  2[]       3 Visual 0[x]  1[]  2[]  3[]      4 Facial Palsy 0[x]  1[]  2[]  3[]      5a Motor Arm - left 0[x]  1[]  2[]  3[]  4[]  UN[]    5b Motor Arm - Right 0[x]  1[]  2[]  3[]  4[]  UN[]    6a Motor Leg - Left 0[]  1[x]  2[]  3[]  4[]  UN[]    6b Motor Leg - Right 0[]  1[]  2[x]  3[]  4[]  UN[]    7 Limb Ataxia 0[x]  1[]  2[]  3[]  UN[]     8 Sensory 0[x]  1[]  2[]  UN[]      9 Best Language 0[x]  1[]  2[]  3[]      10 Dysarthria 0[]  1[x]  2[]  UN[]      11 Extinct. and Inattention 0[x]  1[]  2[]       TOTAL: 4      ROS  Comprehensive ROS performed and pertinent positives documented in HPI   Past History   Past Medical History:  Diagnosis Date   Aphasia 09/01/2019   Benign essential tremor    Benign positional vertigo     Cerebral embolism with cerebral infarction 08/29/2019   Chronic kidney disease 08/28/2019   CVA (cerebral vascular accident) (HCC)    x2 - L retina, 1 right parietal   Degenerative arthritis    Depression    Diabetes mellitus    DVT (deep venous thrombosis) (HCC) 2018   Dyslipidemia    GERD (gastroesophageal reflux disease)    hiatal hernia   Gout    H/O: vasectomy    Hearing aid worn    b/l   Hx of appendectomy    Hx of tonsillectomy    Hypertension    Hypertrophic cardiomyopathy (HCC)    Ischemic optic neuropathy    on the left   Melanoma (HCC)    NSVT (nonsustained ventricular tachycardia) (HCC)    1 4 beat run on event monitor in 07/2020   Obesity    OSA on CPAP    setting = 5   Pulmonary emboli (HCC) 2018   PVC's (premature ventricular contractions)    SVT (supraventricular tachycardia) (HCC)    by event monitor   Tremor, essential 06/22/2017   Wears  glasses     Past Surgical History:  Procedure Laterality Date   APPENDECTOMY     arthroscopic knee surgery Bilateral    CATARACT EXTRACTION Bilateral    COLONOSCOPY     MINOR PLACEMENT OF FIDUCIAL N/A 06/30/2019   Procedure: Fiducial placement;  Surgeon: Maeola Harman, MD;  Location: [facility] OR;  Service: Neurosurgery;  Laterality: N/A;  Fiducial placement   NASAL SEPTUM SURGERY     PULSE GENERATOR IMPLANT N/A 07/14/2019   Procedure: Left cranial Implanted Pulse Generator and lead extension placement to right chest ;  Surgeon: Maeola Harman, MD;  Location: [facility] OR;  Service: Neurosurgery;  Laterality: N/A;   SUBTHALAMIC STIMULATOR INSERTION Left 07/07/2019   Procedure: LEFT DEEP BRAIN STIMULATOR PLACEMENT;  Surgeon: Maeola Harman, MD;  Location: [facility] OR;  Service: Neurosurgery;  Laterality: Left;   TEE WITHOUT CARDIOVERSION N/A 04/23/2021   Procedure: TRANSESOPHAGEAL ECHOCARDIOGRAM (TEE);  Surgeon: Elease Hashimoto Deloris Ping, MD;  Location: [facility] ENDOSCOPY;  Service: Cardiovascular;  Laterality: N/A;   TONSILLECTOMY     TOTAL KNEE  ARTHROPLASTY Left 03/30/2017   Procedure: LEFT TOTAL KNEE ARTHROPLASTY;  Surgeon: Durene Romans, MD;  Location: [facility] ORS;  Service: Orthopedics;  Laterality: Left;   WISDOM TOOTH EXTRACTION      Family History: Family History  Problem Relation Age of Onset   Cerebral aneurysm Mother    Tremor Mother    Alzheimer's disease Father    Tremor Brother    Tremor Maternal Uncle    Diabetes Maternal Grandmother    Healthy Son    Colon cancer Neg Hx     Social History  reports that he quit smoking about 39 years ago. His smoking use included cigarettes. He started smoking about 49 years ago. He has a 10 pack-year smoking history. He has never used smokeless tobacco. He reports that he does not currently use alcohol after a past usage of about 2.0 standard drinks of alcohol per week. He reports that he does not use drugs.  Allergies  Allergen Reactions   Melatonin Other (See Comments)    dizzy    Medications   Current Facility-Administered Medications:    acetaminophen (TYLENOL) tablet 650 mg, 650 mg, Oral, Q6H PRN **OR** acetaminophen (TYLENOL) suppository 650 mg, 650 mg, Rectal, Q6H PRN, Julian Reil, Jared M, DO   amLODipine (NORVASC) tablet 2.5 mg, 2.5 mg, Oral, Daily, Anders Simmonds T, DO, 2.5 mg at 11/11/23 1036   aspirin EC tablet 81 mg, 81 mg, Oral, Daily, Doristine Counter, RPH, 81 mg at 11/11/23 1035   atorvastatin (LIPITOR) tablet 40 mg, 40 mg, Oral, q1800, Doristine Counter, RPH, 40 mg at 11/11/23 1827   clopidogrel (PLAVIX) tablet 75 mg, 75 mg, Oral, Daily, Doristine Counter, RPH, 75 mg at 11/11/23 1037   DULoxetine (CYMBALTA) DR capsule 60 mg, 60 mg, Oral, BID, Doristine Counter, RPH, 60 mg at 11/11/23 2019   enoxaparin (LOVENOX) injection 40 mg, 40 mg, Subcutaneous, Q24H, Julian Reil, Jared M, DO, 40 mg at 11/11/23 1037   ezetimibe (ZETIA) tablet 10 mg, 10 mg, Oral, Daily, Jardin, Carla G, RPH, 10 mg at 11/11/23 1036   feeding supplement (ENSURE ENLIVE / ENSURE PLUS) liquid 237 mL, 237 mL,  Oral, BID BM, Julian Reil, Jared M, DO, 237 mL at 11/11/23 1043   finasteride (PROSCAR) tablet 5 mg, 5 mg, Oral, Daily, Jardin, Carla G, RPH, 5 mg at 11/11/23 1036   insulin aspart (novoLOG) injection 0-15 Units, 0-15 Units, Subcutaneous, TID WC, Gardner, Jared M, DO, 3 Units  at 11/11/23 1827   insulin aspart (novoLOG) injection 0-5 Units, 0-5 Units, Subcutaneous, QHS, Gardner, Jared M, DO   insulin glargine-yfgn [facility]) injection 35 Units, 35 Units, Subcutaneous, Daily, Hillary Bow, DO, 35 Units at 11/11/23 1041   losartan (COZAAR) tablet 100 mg, 100 mg, Oral, Daily, Jardin, Carla G, RPH, 100 mg at 11/11/23 1036   metoprolol succinate (TOPROL-XL) 24 hr tablet 50 mg, 50 mg, Oral, Daily, Jardin, Carla G, RPH, 50 mg at 11/11/23 1035   mirtazapine (REMERON) tablet 7.5 mg, 7.5 mg, Oral, QHS, Anders Simmonds T, DO, 7.5 mg at 11/11/23 2018   ondansetron (ZOFRAN) tablet 4 mg, 4 mg, Oral, Q6H PRN, 4 mg at 11/11/23 2018 **OR** ondansetron (ZOFRAN) injection 4 mg, 4 mg, Intravenous, Q6H PRN, Julian Reil, Jared M, DO   tamsulosin [facility]) capsule 0.4 mg, 0.4 mg, Oral, QPC supper, Doristine Counter, RPH, 0.4 mg at 11/11/23 1827  Vitals   Vitals:   11/11/23 1035 11/11/23 1437 11/11/23 1619 11/11/23 1929  BP: 128/88 (!) 124/94 139/77 133/81  Pulse: 96  97 93  Resp:   18 18  Temp:   98.8 F (37.1 C) 98.7 F (37.1 C)  TempSrc:   Oral Oral  SpO2:   95% 95%  Weight:      Height:        Body mass index is 29.58 kg/m.  Physical Exam   General: Laying comfortably in bed; in no acute distress.  HENT: Normal oropharynx and mucosa. Normal external appearance of ears and nose.  Neck: Supple, no pain or tenderness  CV: No JVD. No peripheral edema.  Pulmonary: Symmetric Chest rise. Normal respiratory effort.  Abdomen: Soft to touch, non-tender.  Ext: No cyanosis, edema, or deformity  Skin: No rash. Normal palpation of skin.   Musculoskeletal: Normal digits and nails by inspection. No clubbing.    Neurologic Examination  Mental status/Cognition: Alert, oriented to self, place, month and year, good attention.  Speech/language: dysarthic speech, fluent, comprehension intact, object naming intact, repetition intact.  Cranial nerves:   CN II Pupils equal and reactive to light, no VF deficits    CN III,IV,VI EOM intact, no gaze preference or deviation, no nystagmus    CN V normal sensation in V1, V2, and V3 segments bilaterally    CN VII no asymmetry, no nasolabial fold flattening    CN VIII normal hearing to speech    CN IX & X normal palatal elevation, no uvular deviation    CN XI 5/5 head turn and 5/5 shoulder shrug bilaterally    CN XII midline tongue protrusion    Motor:  Muscle bulk: normal, tone normal, pronator drift noted in RUE. tremor none Mvmt Root Nerve  Muscle Right Left Comments  SA C5/6 Ax Deltoid  4+   EF C5/6 Mc Biceps  4+   EE C6/7/8 Rad Triceps  4   WF C6/7 Med FCR     WE C7/8 PIN ECU     F Ab C8/T1 U ADM/FDI 4+ 4 Reports R elbow fracture and pain which limits evaluation.  HF L1/2/3 Fem Illopsoas 4 4+   KE L2/3/4 Fem Quad     DF L4/5 D Peron Tib Ant 5 5   PF S1/2 Tibial Grc/Sol 5 5    Sensation:  Light touch Intact throughout   Pin prick    Temperature    Vibration   Proprioception    Coordination/Complex Motor:  - Finger to Nose intact BL - Heel to shin unable  to do with his weakness - Rapid alternating movement are slowed throughout - Gait: deferred.  Labs/Imaging/Neurodiagnostic studies   CBC:  Recent Labs  Lab 2023-12-08 1406  WBC 7.1  NEUTROABS 3.8  HGB 11.5*  HCT 35.9*  MCV 92.1  PLT 276   Basic Metabolic Panel:  Lab Results  Component Value Date   NA 142 2023/12/08   K 4.2 December 08, 2023   CO2 23 December 08, 2023   GLUCOSE 90 Dec 08, 2023   BUN 38 (H) 12/08/2023   CREATININE 2.02 (H) 12-08-23   CALCIUM 9.3 12/08/23   GFRNONAA 34 (L) 2023/12/08   GFRAA 59 (L) Dec 07, 2020   Lipid Panel:  Lab Results  Component Value Date    LDLCALC 70 07/31/2023   HgbA1c:  Lab Results  Component Value Date   HGBA1C 6.6 (H) 07/30/2023   Urine Drug Screen:     Component Value Date/Time   LABOPIA NONE DETECTED 08/28/2019 1413   COCAINSCRNUR NONE DETECTED 08/28/2019 1413   LABBENZ NONE DETECTED 08/28/2019 1413   AMPHETMU NONE DETECTED 08/28/2019 1413   THCU NONE DETECTED 08/28/2019 1413   LABBARB NONE DETECTED 08/28/2019 1413    Alcohol Level     Component Value Date/Time   ETH <10 07/30/2023 0113   INR  Lab Results  Component Value Date   INR 1.1 07/30/2023   APTT  Lab Results  Component Value Date   APTT 33 07/30/2023   AED levels: No results found for: "PHENYTOIN", "ZONISAMIDE", "LAMOTRIGINE", "LEVETIRACETA"  CT Head without contrast(Personally reviewed): CTH was negative for a large hypodensity concerning for a large territory infarct or hyperdensity concerning for an ICH  CT angio Head and Neck with contrast(Personally reviewed): pending  MRI Brain(Personally reviewed): 1. New T2 hyperintense lesion in the posterior left frontal lobe, favored to represent a late subacute infarct.  ASSESSMENT   Lavance A Embery is a 72 y.o. male with hx of DM2, depression, HTN, PE not on AC, prior stroke with baseline L sided weakness, who presents with worsening R leg weakness in addition to his chronic L Leg weakness. He was found to have a late subacute L frontal stroke.  Etiology of stroke is unclear, appears to large to be a small vessel stroke. Will need full stroke workup.  RECOMMENDATIONS  - Frequent Neuro checks per stroke unit protocol - Recommend Vascular imaging with CTA head and neck - Recommend obtaining TTE - Recommend obtaining Lipid panel with LDL - Please start statin if LDL > 70 - Recommend HbA1c to evaluate for diabetes and how well it is controlled. - Antithrombotic - Aspirin 81mg  daily along with plavix 75mg  daily. - Recommend DVT ppx - SBP goal - aim for gradual normotension. - Recommend  Telemetry monitoring for arrythmia - Recommend bedside swallow screen prior to PO intake. - Stroke education booklet - Recommend PT/OT/SLP consult  ______________________________________________________________________    Signed, Erick Blinks, MD [facility]

## 2023-11-11 NOTE — H&P (Addendum)
History and Physical    Patient: Joshua [name]:[id number] DOB: December 08, 1950 DOA: 11/09/2023 DOS: the patient was seen and examined on 11/11/2023 PCP: Creola Corn, MD  Patient coming from: Home  Chief Complaint:  Chief Complaint  Patient presents with   Weakness   HPI: Joshua [name] is a 72 y.o. male with medical history significant of prior CVA, HTN, DM, CKD, essential tremor with deep brain stimulator, prior PE no longer on chronic AC it appears.  Pt in to ED on 12/6 with onset of BLE numbness and weakness.  The patient states when he woke up 12/15 morning he was walking to the kitchen when the backs of his legs felt numb bilaterally.  He states then around 4 PM his legs started to feel weak, worse on the right than the left. He states that he was going to the bathroom and nearly fell but was able to get himself out of the bathroom and into the bed before falling. He states that when he woke up 12/16 AM he tried to get out of bed and slipped from sitting on the edge of the bed to the ground.  He denies hitting his head. He states that he is legs felt too weak and numb to support him. He denies any associated headache or back pain. He denies any dysuria or hematuria or urinary incontinence or retention. He denies any saddle anesthesia. He states that prior to yesterday he was feeling like his normal self without any recent fevers, nausea, vomiting or diarrhea.  ED work up initially was negative AM of 12/17.  Plan was for patient to get MRI of head, L spine, and T spine given BLE weakness during day on 12/17, had to be done during day due to brain stimulator needing to be re-programed to MRI compatible mode.  Apparently however, when they tried to get MRI: "Per MRI, unable to complete scan due to internal device battery being dead. Pt returned to room for device to charge and MRI will now have to be completed at 6am tomorrow morning."   Review of Systems: As mentioned in the history  of present illness. All other systems reviewed and are negative. Past Medical History:  Diagnosis Date   Benign essential tremor    Benign positional vertigo    Chronic kidney disease 08/28/2019   CVA (cerebral vascular accident) (HCC)    x2 - L retina, 1 right parietal   Degenerative arthritis    Depression    Diabetes mellitus    DVT (deep venous thrombosis) (HCC) 2018   Dyslipidemia    GERD (gastroesophageal reflux disease)    hiatal hernia   Gout    H/O: vasectomy    Hearing aid worn    b/l   Hx of appendectomy    Hx of tonsillectomy    Hypertension    Hypertrophic cardiomyopathy (HCC)    Ischemic optic neuropathy    on the left   Melanoma (HCC)    NSVT (nonsustained ventricular tachycardia) (HCC)    1 4 beat run on event monitor in 07/2020   Obesity    OSA on CPAP    setting = 5   Pulmonary emboli (HCC) 2018   PVC's (premature ventricular contractions)    SVT (supraventricular tachycardia) (HCC)    by event monitor   Tremor, essential 06/22/2017   Wears glasses    Past Surgical History:  Procedure Laterality Date   APPENDECTOMY     arthroscopic knee surgery Bilateral  CATARACT EXTRACTION Bilateral    COLONOSCOPY     MINOR PLACEMENT OF FIDUCIAL N/A 06/30/2019   Procedure: Fiducial placement;  Surgeon: Maeola Harman, MD;  Location: [facility] OR;  Service: Neurosurgery;  Laterality: N/A;  Fiducial placement   NASAL SEPTUM SURGERY     PULSE GENERATOR IMPLANT N/A 07/14/2019   Procedure: Left cranial Implanted Pulse Generator and lead extension placement to right chest ;  Surgeon: Maeola Harman, MD;  Location: [facility] OR;  Service: Neurosurgery;  Laterality: N/A;   SUBTHALAMIC STIMULATOR INSERTION Left 07/07/2019   Procedure: LEFT DEEP BRAIN STIMULATOR PLACEMENT;  Surgeon: Maeola Harman, MD;  Location: [facility] OR;  Service: Neurosurgery;  Laterality: Left;   TEE WITHOUT CARDIOVERSION N/A 04/23/2021   Procedure: TRANSESOPHAGEAL ECHOCARDIOGRAM (TEE);  Surgeon: Elease Hashimoto Deloris Ping, MD;   Location: [facility] ENDOSCOPY;  Service: Cardiovascular;  Laterality: N/A;   TONSILLECTOMY     TOTAL KNEE ARTHROPLASTY Left 03/30/2017   Procedure: LEFT TOTAL KNEE ARTHROPLASTY;  Surgeon: Durene Romans, MD;  Location: [facility] ORS;  Service: Orthopedics;  Laterality: Left;   WISDOM TOOTH EXTRACTION     Social History:  reports that he quit smoking about 39 years ago. His smoking use included cigarettes. He started smoking about 49 years ago. He has a 10 pack-year smoking history. He has never used smokeless tobacco. He reports current alcohol use of about 2.0 standard drinks of alcohol per week. He reports that he does not use drugs.  Allergies  Allergen Reactions   Melatonin Other (See Comments)    dizzy    Family History  Problem Relation Age of Onset   Cerebral aneurysm Mother    Tremor Mother    Alzheimer's disease Father    Tremor Brother    Tremor Maternal Uncle    Diabetes Maternal Grandmother    Healthy Son    Colon cancer Neg Hx     Prior to Admission medications   Medication Sig Start Date End Date Taking? Authorizing Provider  amLODipine (NORVASC) 2.5 MG tablet Take 2.5 mg by mouth at bedtime.   Yes [provider]  aspirin 81 MG EC tablet 81 mg daily. 04/23/21  Yes [provider]  clopidogrel (PLAVIX) 75 MG tablet Take 1 tablet (75 mg total) by mouth daily. 08/18/23  Yes Cleaver, Thomasene Ripple, NP  Colchicine (MITIGARE) 0.6 MG CAPS Take 0.6 mg by mouth 2 (two) times daily.   Yes [provider]  cycloSPORINE (RESTASIS) 0.05 % ophthalmic emulsion Apply 1 drop to eye 2 (two) times daily. 01/29/21  Yes [provider]  DULoxetine (CYMBALTA) 60 MG capsule Take 120 mg by mouth daily.   Yes [provider]  ezetimibe (ZETIA) 10 MG tablet TAKE ONE TABLET BY MOUTH ONCE DAILY. 09/29/23  Yes Little Ishikawa, MD  fenofibrate 54 MG tablet Take 54 mg by mouth daily.   Yes [provider]  finasteride (PROSCAR) 5 MG tablet Take 1 tablet (5 mg  total) by mouth daily. Patient taking differently: Take 5 mg by mouth at bedtime. 10/27/23  Yes Vanna Scotland, MD  Insulin Glargine, 1 Unit Dial, (TOUJEO SOLOSTAR) 300 UNIT/ML SOPN Inject 70 Units into the skin 2 (two) times daily.   Yes [provider]  losartan (COZAAR) 100 MG tablet Take 1 tablet by mouth daily. 03/25/20  Yes [provider]  metFORMIN (GLUCOPHAGE) 500 MG tablet Take 500 mg by mouth 2 (two) times daily with a meal. 03/25/20  Yes [provider]  metoprolol succinate (TOPROL-XL) 50 MG 24 hr  tablet Take 1 tablet (50 mg total) by mouth daily. Patient taking differently: Take 50 mg by mouth at bedtime. 10/30/23  Yes Little Ishikawa, MD  mirtazapine (REMERON) 7.5 MG tablet Take 7.5 mg by mouth at bedtime.   Yes [provider]  Multiple Vitamin (MULTIVITAMIN WITH MINERALS) TABS tablet Take 1 tablet by mouth daily.   Yes [provider]  mupirocin ointment (BACTROBAN) 2 % Apply 1 Application topically 2 (two) times daily. 10/16/23  Yes Vivi Barrack, DPM  Polyethyl Glycol-Propyl Glycol (SYSTANE OP) Place 1-2 drops into both eyes 4 (four) times daily as needed (dry eyes).    Yes [provider]  tamsulosin (FLOMAX) 0.4 MG CAPS capsule Take 1 capsule (0.4 mg total) by mouth daily. 09/23/23  Yes Vanna Scotland, MD  amLODipine (NORVASC) 10 MG tablet TAKE ONE TABLET AT BEDTIME. please call to schedule yearly appointment for additional refills Patient not taking: Reported on 11/10/2023 10/15/23   Little Ishikawa, MD  Continuous Blood Gluc Sensor (FREESTYLE LIBRE 2 SENSOR) MISC See admin instructions.    [provider]  PRESCRIPTION MEDICATION Inhale into the lungs at bedtime. CPAP    [provider]    Physical Exam: Vitals:   11/10/23 2200 11/11/23 0110 11/11/23 0200 11/11/23 0226  BP: (!) 146/92 (!) 171/98 (!) 155/93   Pulse: (!) 101 (!) 102    Resp: 18 18    Temp: 98.1 F (36.7 C)   (!) 97.4  F (36.3 C)  TempSrc:    Temporal  SpO2: 99% 98%    Weight:      Height:       Constitutional: NAD, calm, comfortable Respiratory: clear to auscultation bilaterally, no wheezing, no crackles. Normal respiratory effort. No accessory muscle use.  Cardiovascular: Regular rate and rhythm, no murmurs / rubs / gallops. No extremity edema. 2+ pedal pulses. No carotid bruits.  Abdomen: no tenderness, no masses palpated. No hepatosplenomegaly. Bowel sounds positive.  Neurologic: Tremor present Unable to hold R leg off of bed, decent knee flexion and extension.  Decreased sensation on R dorsal surface of foot and R medial shin. Mild drift in LLE, 5/5 strength in knee extension and plantar/dorsiflextion Decreased sensation on L medial foot and medial shin  Psychiatric: Normal judgment and insight. Alert and oriented x 3. Normal mood.   Data Reviewed:    Labs on Admission: I have personally reviewed following labs and imaging studies  CBC: Recent Labs  Lab 11/09/23 1406  WBC 7.1  NEUTROABS 3.8  HGB 11.5*  HCT 35.9*  MCV 92.1  PLT 276   Basic Metabolic Panel: Recent Labs  Lab 11/09/23 1406  NA 142  K 4.2  CL 109  CO2 23  GLUCOSE 90  BUN 38*  CREATININE 2.02*  CALCIUM 9.3   GFR: Estimated Creatinine Clearance: 43.7 mL/min (A) (by C-G formula based on SCr of 2.02 mg/dL (H)). Liver Function Tests: Recent Labs  Lab 11/09/23 1406  AST 35  ALT 30  ALKPHOS 101  BILITOT 0.7  PROT 6.6  ALBUMIN 3.3*   No results for input(s): "LIPASE", "AMYLASE" in the last 168 hours. No results for input(s): "AMMONIA" in the last 168 hours. Coagulation Profile: No results for input(s): "INR", "PROTIME" in the last 168 hours. Cardiac Enzymes: No results for input(s): "CKTOTAL", "CKMB", "CKMBINDEX", "TROPONINI" in the last 168 hours. BNP (last 3 results) No results for input(s): "PROBNP" in the last 8760 hours. HbA1C: No results for input(s): "HGBA1C" in the last 72  hours. CBG: Recent  Labs  Lab 11/09/23 1405 11/10/23 1109  GLUCAP 87 108*   Lipid Profile: No results for input(s): "CHOL", "HDL", "LDLCALC", "TRIG", "CHOLHDL", "LDLDIRECT" in the last 72 hours. Thyroid Function Tests: No results for input(s): "TSH", "T4TOTAL", "FREET4", "T3FREE", "THYROIDAB" in the last 72 hours. Anemia Panel: No results for input(s): "VITAMINB12", "FOLATE", "FERRITIN", "TIBC", "IRON", "RETICCTPCT" in the last 72 hours. Urine analysis:    Component Value Date/Time   COLORURINE YELLOW 11/09/2023 1752   APPEARANCEUR CLEAR 11/09/2023 1752   APPEARANCEUR Clear 10/27/2023 0944   LABSPEC 1.014 11/09/2023 1752   PHURINE 5.0 11/09/2023 1752   GLUCOSEU NEGATIVE 11/09/2023 1752   HGBUR NEGATIVE 11/09/2023 1752   BILIRUBINUR NEGATIVE 11/09/2023 1752   BILIRUBINUR Negative 10/27/2023 0944   KETONESUR NEGATIVE 11/09/2023 1752   PROTEINUR >=300 (A) 11/09/2023 1752   UROBILINOGEN 0.2 04/08/2011 0732   NITRITE NEGATIVE 11/09/2023 1752   LEUKOCYTESUR NEGATIVE 11/09/2023 1752    Radiological Exams on Admission: CT Head Wo Contrast Result Date: 11/09/2023 CLINICAL DATA:  Weakness, stroke suspected EXAM: CT HEAD WITHOUT CONTRAST TECHNIQUE: Contiguous axial images were obtained from the base of the skull through the vertex without intravenous contrast. RADIATION DOSE REDUCTION: This exam was performed according to the departmental dose-optimization program which includes automated exposure control, adjustment of the mA and/or kV according to patient size and/or use of iterative reconstruction technique. COMPARISON:  07/30/2023 CT head FINDINGS: Brain: No evidence of acute infarction, hemorrhage, mass, mass effect, or midline shift. No hydrocephalus or extra-axial fluid collection. Redemonstrated left-sided DBS electrode foot, in unchanged position. Remote lacunar infarct in the right basal ganglia. Periventricular white matter changes, likely the sequela of chronic small vessel ischemic disease. Vascular:  No hyperdense vessel. Skull: Negative for fracture or focal lesion. Left parietal burr hole. Sinuses/Orbits: No acute finding. Redemonstrated retained metallic density anterior to the left globe. Other: The mastoid air cells are well aerated. IMPRESSION: No acute intracranial process. Electronically Signed   By: Wiliam Ke M.D.   On: 11/09/2023 18:10    EKG: Independently reviewed.   Assessment and Plan: * Generalized weakness  Admitting pt to complete MRIs since its now been 36h or more in ED However, I wonder if the fact that brain stimulator isnt / wasn't working at all due to dead battery, might have something to do with his onset of weakness? Will be interesting to see if weakness persists or resolves once brain stimulator charged / functioning again. Charging brain stimulator PT/OT ordered  Essential tremor Resume deep brain stimulator treatment once this is charged.  History of pulmonary embolism Looks like he's no longer on long term AC at this point in time other than ASA + Plavix.  Chronic kidney disease Creat 2.0 today appears c/w baseline Though probably shouldn't be on metformin with this.  OSA on CPAP Cont CPAP per home settings  Diabetes mellitus type 2 in obese (HCC) Mod scale SSI AC/HS for the moment Hold metformin due to Creat of 2.0 (though this creat appears chronic). Reports he takes 70u toujeo BID at home. Will re-order at 35u daily for the moment      Advance Care Planning:   Code Status: Full Code  Consults: None  Family Communication: No family in room  Severity of Illness: The appropriate patient status for this patient is OBSERVATION. Observation status is judged to be reasonable and necessary in order to provide the required intensity of service to ensure the patient's safety. The patient's presenting symptoms, physical exam  findings, and initial radiographic and laboratory data in the context of their medical condition is felt to place them  at decreased risk for further clinical deterioration. Furthermore, it is anticipated that the patient will be medically stable for discharge from the hospital within 2 midnights of admission.   Author: Hillary Bow., DO 11/11/2023 5:13 AM  For on call review [email or web address].

## 2023-11-11 NOTE — Assessment & Plan Note (Signed)
11-11-2023 stable. Will repeat CMP in AM. 11-12-2023 Scr 1.99. stable.

## 2023-11-11 NOTE — [place] FL2 (Signed)
[place] MEDICAID FL2 LEVEL OF CARE FORM     IDENTIFICATION  Patient Name: Joshua [name] Birthdate: 03-02-1951 Sex: male Admission Date (Current Location): 11/09/2023  [facility] and [place] Number:      Facility and Address:  [name], [address] [place], [place] [address]      Provider Number: [id number]  Attending Physician Name and Address:  Carollee Herter, DO  Relative Name and Phone Number:  Rhoen, Langill (Spouse)  [phone]    Current Level of Care: Hospital Recommended Level of Care: Skilled Nursing Facility Prior Approval Number:    Date Approved/Denied:   PASRR Number: [id number]  Discharge Plan: SNF    Current Diagnoses: Patient Active Problem List   Diagnosis Date Noted   Generalized weakness 11/11/2023   CVA (cerebral vascular accident) (HCC) 11/11/2023   Dysarthria 07/30/2023   Other reduced mobility 05/08/2023   Dystrophia unguium 08/05/2021   Neuropathy 08/05/2021   Unilateral primary osteoarthritis, right knee 08/05/2021   History of pulmonary embolism 04/21/2021   History of stroke 04/21/2021   Bilateral hearing loss 05/15/2020   Bilateral impacted cerumen 05/15/2020   Hearing loss 03/27/2020   Melanocytic nevi, unspecified 03/27/2020   Hyperlipidemia 08/28/2019   Overweight (BMI 25.0-29.9) 08/28/2019   Tremor 07/07/2019   Lymphadenopathy 12/31/2018   Essential tremor 06/22/2017   Hardening of the aorta (main artery of the heart) (HCC) 04/15/2017   OSA on CPAP 04/10/2017   Essential hypertension 04/10/2017   Orthostatic hypotension 04/09/2017   S/P left TKA 03/30/2017   S/P total knee replacement 03/30/2017   Varicose veins of lower extremity 05/26/2016   Pre-ulcerative calluses 03/07/2016   Diabetic peripheral neuropathy associated with type 2 diabetes mellitus (HCC) 10/19/2015   Plantar wart 08/08/2015   CKD stage 3b, GFR 30-44 ml/min (HCC) - baseline SCr 2.0 10/21/2013   Late effects of cerebrovascular  disease 10/21/2013   Major depression, single episode, in complete remission (HCC) 03/04/2013   Panic disorder 03/04/2013   Allergic rhinitis 01/23/2011   Diabetic renal disease (HCC) 11/29/2009   Gout 09/20/2009   Osteoarthritis 09/20/2009   Proteinuria 09/20/2009   Ventricular premature depolarization 09/20/2009   Diabetes mellitus type 2 in obese (HCC) 08/07/2008   GERD 08/07/2008    Orientation RESPIRATION BLADDER Height & Weight     Self, Time, Place, Situation  Normal Continent Weight: 243 lb (110.2 kg) Height:  6\' 4"  (193 cm)  BEHAVIORAL SYMPTOMS/MOOD NEUROLOGICAL BOWEL NUTRITION STATUS      Continent Diet (see DC summary)  AMBULATORY STATUS COMMUNICATION OF NEEDS Skin   Extensive Assist Verbally Normal                       Personal Care Assistance Level of Assistance  Bathing, Dressing, Feeding Bathing Assistance: Limited assistance Feeding assistance: Independent Dressing Assistance: Limited assistance     Functional Limitations Info  Hearing, Speech, Sight Sight Info: Impaired (wears glasses) Hearing Info: Impaired Speech Info: Adequate    SPECIAL CARE FACTORS FREQUENCY  PT (By licensed PT), OT (By licensed OT)     PT Frequency: 5x/week OT Frequency: 5x/week            Contractures Contractures Info: Not present    Additional Factors Info  Code Status Code Status Info: FULL             Current Medications (11/11/2023):  This is the current hospital active medication list Current Facility-Administered Medications  Medication Dose Route Frequency Provider Last  Rate Last Admin   acetaminophen (TYLENOL) tablet 650 mg  650 mg Oral Q6H PRN Hillary Bow, DO       Or   acetaminophen (TYLENOL) suppository 650 mg  650 mg Rectal Q6H PRN Hillary Bow, DO       amLODipine (NORVASC) tablet 2.5 mg  2.5 mg Oral Daily Anders Simmonds T, DO   2.5 mg at 11/11/23 1036   aspirin EC tablet 81 mg  81 mg Oral Daily Doristine Counter, RPH   81 mg at  11/11/23 1035   atorvastatin (LIPITOR) tablet 40 mg  40 mg Oral q1800 Doristine Counter, RPH   40 mg at 11/10/23 1854   clopidogrel (PLAVIX) tablet 75 mg  75 mg Oral Daily Doristine Counter, RPH   75 mg at 11/11/23 1037   DULoxetine (CYMBALTA) DR capsule 60 mg  60 mg Oral BID Doristine Counter, RPH   60 mg at 11/11/23 1037   enoxaparin (LOVENOX) injection 40 mg  40 mg Subcutaneous Q24H Lyda Perone M, DO   40 mg at 11/11/23 1037   ezetimibe (ZETIA) tablet 10 mg  10 mg Oral Daily Doristine Counter, RPH   10 mg at 11/11/23 1036   feeding supplement (ENSURE ENLIVE / ENSURE PLUS) liquid 237 mL  237 mL Oral BID BM Hillary Bow, DO   237 mL at 11/11/23 1043   finasteride (PROSCAR) tablet 5 mg  5 mg Oral Daily Doristine Counter, RPH   5 mg at 11/11/23 1036   insulin aspart (novoLOG) injection 0-15 Units  0-15 Units Subcutaneous TID WC Hillary Bow, DO   3 Units at 11/11/23 1042   insulin aspart (novoLOG) injection 0-5 Units  0-5 Units Subcutaneous QHS Lyda Perone M, DO       insulin glargine-yfgn [facility]) injection 35 Units  35 Units Subcutaneous Daily Hillary Bow, DO   35 Units at 11/11/23 1041   losartan (COZAAR) tablet 100 mg  100 mg Oral Daily Doristine Counter, RPH   100 mg at 11/11/23 1036   metoprolol succinate (TOPROL-XL) 24 hr tablet 50 mg  50 mg Oral Daily Doristine Counter, RPH   50 mg at 11/11/23 1035   mirtazapine (REMERON) tablet 7.5 mg  7.5 mg Oral QHS Anders Simmonds T, DO   7.5 mg at 11/10/23 2135   ondansetron (ZOFRAN) tablet 4 mg  4 mg Oral Q6H PRN Hillary Bow, DO       Or   ondansetron [facility]) injection 4 mg  4 mg Intravenous Q6H PRN Hillary Bow, DO       tamsulosin [facility]) capsule 0.4 mg  0.4 mg Oral QPC supper Doristine Counter, RPH   0.4 mg at 11/10/23 1610     Discharge Medications: Please see discharge summary for a list of discharge medications.  Relevant Imaging Results:  Relevant Lab Results:   Additional Information SSN: [id number]  Rontae Inglett A [place],  [place]

## 2023-11-11 NOTE — Evaluation (Signed)
Physical Therapy Evaluation Patient Details Name: Joshua [name] MRN: [id number] DOB: 1950/12/20 Today's Date: 11/11/2023  History of Present Illness  72 y.o. male admitted 12/16  with onset of BLE numbness and weakness. PMHx: prior CVA, HTN, DM, CKD, essential tremor with deep brain stimulator, prior PE.   Clinical Impression  Pt admitted with above complications. PTA he was ambulatory, houshold distances with a rollator but becomes very SOB; states neuro has ruled this as a result of prior CVA and was r/o cardiopulmonary etiology from pulmonologist and cardiologist. See MMT results below, does not appear to be focal. BIL LE sensation diminished distal to knees, absent in dorsum of toes, reports hx of neuropathy but states this is acutely worse. Pt is very anxious with standing, feels that LEs will buckle spontaneously. He requires min assist initially for bed mobility and seated balance, leaning posteriorly and somewhat neglectful of RLE position on EOB unless cued to square hips on EOB which he can do himself. Pt required min assist to stand from elevated surface x2. Sits spontaneously due to anxiety and fear of falling. Progressed to step pivot transfer to recliner, rushes and sits prematurely with reduced control despite max cues. Wife present, supportive, but states she cannot adequately care for pt at home with hx of several falls. May benefit from post acute rehab to enhance safety and independence. Patient will benefit from intensive inpatient follow up therapy, >3 hours/day. Will follow and progress acutely. Pt currently with functional limitations due to the deficits listed below (see PT Problem List). Pt will benefit from acute skilled PT to increase their independence and safety with mobility to allow discharge.           If plan is discharge home, recommend the following: A little help with walking and/or transfers;A little help with bathing/dressing/bathroom;Assistance with  cooking/housework;Assist for transportation;Help with stairs or ramp for entrance   Can travel by private vehicle        Equipment Recommendations None recommended by PT  Recommendations for Other Services  Rehab consult    Functional Status Assessment Patient has had a recent decline in their functional status and demonstrates the ability to make significant improvements in function in a reasonable and predictable amount of time.     Precautions / Restrictions Precautions Precautions: Fall Precaution Comments: Recent Rt distal humerus fracture fx (5 weeks ago) Pt states use as tolerated; wife reports told not to push/pull but okay for ROM exercises. Required Braces or Orthoses: Other Brace (Has Rt elbow splint, pt reports he was told by ortho to use as needed.) Other Brace: Rt elbow splint; pt states he was told to use as needed. Restrictions Other Position/Activity Restrictions: Has Rt distal humerus fracture      Mobility  Bed Mobility Overal bed mobility: Needs Assistance Bed Mobility: Supine to Sit     Supine to sit: Min assist, HOB elevated, Used rails     General bed mobility comments: Min assist for trunk support to rise to EOB. Leans posteriorly with LOB x1 needing assist to correct. difficulty scooting to EOB but performed without assist.    Transfers Overall transfer level: Needs assistance Equipment used: Rolling walker (2 wheels) Transfers: Sit to/from Stand, Bed to chair/wheelchair/BSC Sit to Stand: Min assist, From elevated surface   Step pivot transfers: Min assist       General transfer comment: Min assist for boost to stand from EOB, elevated. [place] [employer], requires a couple of attempts to rise. Pt very anxious.  Min assist for RW control for step pivot to recliner. Attempts to sit prematurely. Did not follow instructions to reach back. Sits back unconrolled. Max cues for technique. No buckling although quite tremulous. Pt reports feeling as if LEs  would buckle at any moment however this did not occur.    Ambulation/Gait                  Stairs            Wheelchair Mobility     Tilt Bed    Modified Rankin (Stroke Patients Only)       Balance Overall balance assessment: Needs assistance Sitting-balance support: No upper extremity supported, Feet supported Sitting balance-Leahy Scale: Fair Sitting balance - Comments: Min assist initially, progressed to CGA Postural control: Posterior lean Standing balance support: Single extremity supported Standing balance-Leahy Scale: Poor                               Pertinent Vitals/Pain Pain Assessment Pain Assessment: No/denies pain    Home Living Family/patient expects to be discharged to:: Private residence Living Arrangements: Spouse/significant other Available Help at Discharge: Family;Available 24 hours/day Type of Home: House Home Access: Stairs to enter Entrance Stairs-Rails: Right Entrance Stairs-Number of Steps: 4-5   Home Layout: One level Home Equipment: Shower seat - built in;Grab bars - tub/shower;Hand held shower head;Cane - single point;Rollator (4 wheels);Rolling Walker (2 wheels)      Prior Function Prior Level of Function : Independent/Modified Independent;History of Falls (last six months)             Mobility Comments: Modified Independent with rollator ADLs Comments: Min A with lower body ADL and buttons due to tremors     Extremity/Trunk Assessment   Upper Extremity Assessment Upper Extremity Assessment: Defer to OT evaluation    Lower Extremity Assessment Lower Extremity Assessment: Generalized weakness;RLE deficits/detail;LLE deficits/detail RLE Deficits / Details: hip flexion 4/5; knee extension 4+/5, knee flexion 4/5, ankle DF 4/5, hallux extension 3+/5, ankle eversion 3+/5, hip IR 3/5, hip ER 3+/5. RLE Sensation: decreased light touch;decreased proprioception;history of peripheral neuropathy (Distal to  knee. Reports acutely worse.) RLE Coordination:  (Heel knee shin, with limited ROM but able to track accurately in shortened range.) LLE Deficits / Details: Hip flexion 4+/5, knee extension 4-/5, knee flexion 4/5, ankle DF 4-/5, ankle eversion 4/5, hip IR 3-/5, hip ER 4/5 LLE Sensation: decreased light touch;decreased proprioception;history of peripheral neuropathy (distal to knee, reports acutely worse.) LLE Coordination:  (abnormal heel knee shin test and limited ROM with testing.)       Communication   Communication Communication: Difficulty communicating thoughts/reduced clarity of speech  Cognition Arousal: Alert Behavior During Therapy: WFL for tasks assessed/performed Overall Cognitive Status: Within Functional Limits for tasks assessed                                          General Comments General comments (skin integrity, edema, etc.): Wife present, supportive, does not feel she can care for him due to multiple falls at home    Exercises General Exercises - Lower Extremity Ankle Circles/Pumps: AROM, Both, 10 reps, Standing Gluteal Sets: Strengthening, Both, 10 reps, Seated Long Arc Quad: Strengthening, Both, 10 reps, Seated Hip ABduction/ADduction: Strengthening, Both, 10 reps, Seated   Assessment/Plan    PT Assessment Patient needs continued  PT services  PT Problem List Decreased strength;Decreased range of motion;Decreased activity tolerance;Decreased balance;Decreased mobility;Decreased coordination;Decreased knowledge of use of DME;Decreased knowledge of precautions;Decreased safety awareness;Impaired sensation       PT Treatment Interventions DME instruction;Gait training;Stair training;Functional mobility training;Therapeutic activities;Therapeutic exercise;Neuromuscular re-education;Balance training;Patient/family education;Wheelchair mobility training    PT Goals (Current goals can be found in the Care Plan section)  Acute Rehab PT  Goals Patient Stated Goal: Get stronger before returning home. prevent falls PT Goal Formulation: With patient/family Time For Goal Achievement: 11/25/23 Potential to Achieve Goals: Good    Frequency Min 1X/week     Co-evaluation               AM-PAC PT "6 Clicks" Mobility  Outcome Measure Help needed turning from your back to your side while in a flat bed without using bedrails?: A Little Help needed moving from lying on your back to sitting on the side of a flat bed without using bedrails?: A Little Help needed moving to and from a bed to a chair (including a wheelchair)?: A Little Help needed standing up from a chair using your arms (e.g., wheelchair or bedside chair)?: A Little Help needed to walk in hospital room?: A Lot Help needed climbing 3-5 steps with a railing? : Total 6 Click Score: 15    End of Session Equipment Utilized During Treatment: Gait belt Activity Tolerance: Patient tolerated treatment well;Other (comment) (Anxious) Patient left: in chair;with call bell/phone within reach;with chair alarm set;with family/visitor present Nurse Communication: Mobility status;Precautions PT Visit Diagnosis: Unsteadiness on feet (R26.81);Repeated falls (R29.6);Muscle weakness (generalized) (M62.81);History of falling (Z91.81);Difficulty in walking, not elsewhere classified (R26.2);Other symptoms and signs involving the nervous system (R29.898)    Time: 4098-1191 PT Time Calculation (min) (ACUTE ONLY): 36 min   Charges:   PT Evaluation $PT Eval Moderate Complexity: 1 Mod PT Treatments $Therapeutic Activity: 8-22 mins PT General Charges $$ ACUTE PT VISIT: 1 Visit         Kathlyn Sacramento, PT, DPT [facility]  Rehabilitation Services Physical Therapist Office: [phone] Website: [email or web address]   Berton Mount 11/11/2023, 12:25 PM

## 2023-11-11 NOTE — Plan of Care (Signed)

## 2023-11-11 NOTE — Assessment & Plan Note (Addendum)
11-11-2023 stable. BP stable. 11-12-2023, 11-13-2023, 11-14-2023 stable. Remains on Toprol-xl 50 mg qday, cozaar 100 mg qday., norvasc 2.5 mg qday

## 2023-11-11 NOTE — Subjective & Objective (Addendum)
Pt seen and examined.  Transcranial doppler with bubble study order as requested by neurology. Unable to get this performed yesterday.  Neurology also requesting LE U/S to r/o DVT.  Discussed with pt that he cannot refuse PT/OT if he has any hopes of getting into SNF for rehab.  Pt states he understands and will make every effort to participate.  Explained to him that it's not about his performance with therapy.  It's about his willingnes to attempt and try that is important.

## 2023-11-11 NOTE — ED Provider Notes (Signed)
When I spoke to patient, he reports gradually worsening weakness over the past week he is having difficulty walking.  He is able to move his legs while in the bed, but appears to be limited. Significant difficulty obtaining MRI imaging due to deep brain stimulator. Due to ongoing weakness, difficulty walking, patient will be admitted Discussed with Dr. Julian Reil for admission   Zadie Rhine, MD 11/11/23 [phone]

## 2023-11-11 NOTE — Assessment & Plan Note (Addendum)
11-11-2023 unfortunately, pt's MRI brain showed subacute CVA of left posterior frontal lobe. Pt was taking ASA/Plavix when he had his CVA this time.  In September 2024, pt had another while he was on Eliquis and Asa 81 mg daily.  Pt intolerant to statins. Already on zetia and fenofibrate. His LDL in September was 70. No history of afib. But he had a CVA while on Eliquis in September 2024.  Pt is in a very tough situation. He continues to have recurrent CVA despite DAPT. He had a CVA in 07-2023 while on systemic anticoagulation and ASA 81 mg daily.  Will check limited echo to make sure he doesn't have thrombus, but I doubt it.  Initially I thought that CIR may be a way for him to get rehab. Pt is a large man. About 6 feet 4 inches. Wife states she cannot physically lift him at home.  OT has messaged me that pt wants to go to SNF now.  11-12-2023 neurology consulted. Awaiting further recs  11-13-2023 TEE showed PFO. Neurology wants transcranial dopplers with bubble study and LE U/S to r/o DVT.  Pt did have a subclavian vein DVT around 03-2021. It's possible he could have thrown a thrombus thru his PFO that caused his CVA.  The TCD with bubble should answer the question if the PFO is significant enough.  11-14-2023 awaiting TCD with bubble study and LE U/S as requested by neurology. 11-15-2023 TCD with bubble study showed NO significant right to left shunting. Pt has bilateral LE DVT. Discussed with neurology Dr. Pearlean Brownie. Will treat his recurrent CVA with Eliquis alone. No antiplatelets(ie. Asa or plavix). Since this is his 2nd DVT, he will need lifelong anticoagulation. Start Eliquis 10 mg bid x 7 days, then decrease to 5 mg bid.

## 2023-11-11 NOTE — Assessment & Plan Note (Addendum)
11-11-2023 continue with lantus and SSI. CBG acceptable ranges. 11-12-2023, 11-13-2023, 11-14-2023 stable. Acceptable CBG ranges

## 2023-11-11 NOTE — Assessment & Plan Note (Addendum)
11-11-2023 Looks like he is no longer on long term AC at this point in time other than ASA + Plavix.  11-15-2023 pt with bilateral LE DVT. He will need lifelong systemic anticoagulation. Starting eliquis today.

## 2023-11-11 NOTE — Assessment & Plan Note (Addendum)
11-11-2023 Cont CPAP per home settings 11-12-2023, 11-13-2023, 11-14-2023 stable

## 2023-11-11 NOTE — Progress Notes (Addendum)
Transition of Care [facility]) - Inpatient Brief Assessment   Patient Details  Name: Joshua [name] MRN: [id number] Date of Birth: Jul 31, 1951  Transition of [facility]) CM/SW Contact:    Janae Bridgeman, RN Phone Number: 11/11/2023, 3:36 PM   Clinical Narrative: Patient admitted to the hospital with Bilateral lower extremity weakness.  The patient's wife declines CIR and would prefer SNF placement since she believes that patient will need longer than 2 weeks of rehabilitation before returning home.  The patient was asleep but was agreeable to SNF placement.  The patient has DME at the home that includes shower seat, shower head, cane rolator and 2 rolling walkers.  The patient's wife states that patient is active at the [place] and she just ordered an electric wheelchair through the [place] for home.  Patient's wife requested Outpatient Palliative Care involvement for SNF/home.  I placed a referral for OUtpatient Palliative to [facility] since patient's wife requested the company specifically.  [place], MSW was updated that patient needs SNF placement and she completed FL2 and faxed him out for available bed offers.  CM and MSW will follow up with the wife/patient for bed offers when available tomorrow.  11/11/23 1544 - I called and left a message with the [facility] to notify of patient's admission to the hospital and receive [facility] information to update - wife is aware.   Transition of Care Asessment: Insurance and Status: (P) Insurance coverage has been reviewed Patient has primary care physician: (P) Yes Home environment has been reviewed: (P) from home with wife Prior level of function:: (P) assistance from wife at home Prior/Current Home Services: (P) No current home services Social Drivers of Health Review: (P) SDOH reviewed interventions complete Readmission risk has been reviewed: (P) Yes Transition of care needs: (P) transition of care needs identified, TOC will continue to  follow

## 2023-11-11 NOTE — Progress Notes (Signed)
Inpatient Rehabilitation Admissions Coordinator   Noted preference for SNF, therefore we will sign off and not proceed.  Ottie Glazier, RN, MSN Rehab Admissions Coordinator [phone] 11/11/2023 5:13 PM

## 2023-11-11 NOTE — Assessment & Plan Note (Signed)
BMI 29.58

## 2023-11-12 ENCOUNTER — Telehealth: Payer: Self-pay

## 2023-11-12 ENCOUNTER — Other Ambulatory Visit: Payer: Self-pay | Admitting: Cardiology

## 2023-11-12 ENCOUNTER — Inpatient Hospital Stay (HOSPITAL_COMMUNITY): Payer: No Typology Code available for payment source

## 2023-11-12 DIAGNOSIS — I6389 Other cerebral infarction: Secondary | ICD-10-CM

## 2023-11-12 DIAGNOSIS — I639 Cerebral infarction, unspecified: Secondary | ICD-10-CM

## 2023-11-12 DIAGNOSIS — I10 Essential (primary) hypertension: Secondary | ICD-10-CM | POA: Diagnosis not present

## 2023-11-12 DIAGNOSIS — R531 Weakness: Secondary | ICD-10-CM | POA: Diagnosis not present

## 2023-11-12 DIAGNOSIS — N1832 Chronic kidney disease, stage 3b: Secondary | ICD-10-CM | POA: Diagnosis not present

## 2023-11-12 DIAGNOSIS — E663 Overweight: Secondary | ICD-10-CM

## 2023-11-12 LAB — COMPREHENSIVE METABOLIC PANEL
ALT: 28 U/L (ref 0–44)
AST: 31 U/L (ref 15–41)
Albumin: 2.8 g/dL — ABNORMAL LOW (ref 3.5–5.0)
Alkaline Phosphatase: 87 U/L (ref 38–126)
Anion gap: 6 (ref 5–15)
BUN: 37 mg/dL — ABNORMAL HIGH (ref 8–23)
CO2: 24 mmol/L (ref 22–32)
Calcium: 8.6 mg/dL — ABNORMAL LOW (ref 8.9–10.3)
Chloride: 109 mmol/L (ref 98–111)
Creatinine, Ser: 1.99 mg/dL — ABNORMAL HIGH (ref 0.61–1.24)
GFR, Estimated: 35 mL/min — ABNORMAL LOW (ref >60)
Glucose, Bld: 169 mg/dL — ABNORMAL HIGH (ref 70–99)
Potassium: 3.7 mmol/L (ref 3.5–5.1)
Sodium: 139 mmol/L (ref 135–145)
Total Bilirubin: 0.5 mg/dL (ref <1.2)
Total Protein: 5.8 g/dL — ABNORMAL LOW (ref 6.5–8.1)

## 2023-11-12 LAB — LIPID PANEL
Cholesterol: 114 mg/dL (ref 0–200)
HDL: 23 mg/dL — ABNORMAL LOW (ref >40)
LDL Cholesterol: 59 mg/dL (ref 0–99)
Total CHOL/HDL Ratio: 5 {ratio}
Triglycerides: 162 mg/dL — ABNORMAL HIGH (ref <150)
VLDL: 32 mg/dL (ref 0–40)

## 2023-11-12 LAB — GLUCOSE, CAPILLARY
Glucose-Capillary: 162 mg/dL — ABNORMAL HIGH (ref 70–99)
Glucose-Capillary: 144 mg/dL — ABNORMAL HIGH (ref 70–99)
Glucose-Capillary: 129 mg/dL — ABNORMAL HIGH (ref 70–99)
Glucose-Capillary: 176 mg/dL — ABNORMAL HIGH (ref 70–99)

## 2023-11-12 LAB — ECHOCARDIOGRAM LIMITED
Height: 76 in
Weight: 3888 [oz_av]

## 2023-11-12 LAB — HEMOGLOBIN A1C
Hgb A1c MFr Bld: 6.4 % — ABNORMAL HIGH (ref 4.8–5.6)
Mean Plasma Glucose: 136.98 mg/dL

## 2023-11-12 MED ORDER — PERFLUTREN LIPID MICROSPHERE
1.0000 mL | INTRAVENOUS | Status: AC | PRN
Start: 2023-11-12 — End: 2023-11-12
  Administered 2023-11-12: 10 mL via INTRAVENOUS

## 2023-11-12 MED ORDER — TICAGRELOR 90 MG PO TABS
90.0000 mg | ORAL_TABLET | Freq: Two times a day (BID) | ORAL | Status: DC
  Administered 2023-11-12 – 2023-11-13 (×2): 90 mg via ORAL
  Filled 2023-11-12 (×2): qty 1

## 2023-11-12 NOTE — H&P (View-Only) (Signed)
  [facility] asked to complete TEE for evaluation in the setting of CVA.   Informed Consent   Shared Decision Making/Informed Consent   The risks [esophageal damage, perforation (1:10,000 risk), bleeding, pharyngeal hematoma as well as other potential complications associated with conscious sedation including aspiration, arrhythmia, respiratory failure and death], benefits (treatment guidance and diagnostic support) and alternatives of a transesophageal echocardiogram were discussed in detail with Joshua [name] and he is willing to proceed.

## 2023-11-12 NOTE — Telephone Encounter (Signed)
Pt WIFE (DPR) informed of providers result & recommendations. Pt verbalized understanding. All questions, if any, were answered. MONITOR ORDER ENTERED

## 2023-11-12 NOTE — Progress Notes (Addendum)
STROKE TEAM PROGRESS NOTE   BRIEF HPI [name] is a 72 y.o. male with history of DM2, depression, HTN, PE not on AC, prior stroke with baseline L sided weakness, who presents with worsening R leg weakness in addition to his chronic L Leg weakness. He reports getting up in the morning yesterday and was wobbly, off balance and felt like his R leg was going to give away. However, feels weakness has been going on for over a week. He also endorses slurred speech. Reports having stroke back in July of this year and that caused his left side to be weak but this time, its the right side that is weaker. He had MRI Brain w.o contrast which demonstrated posterior L frontal stroke.   NIH on Admission 4  INTERIM HISTORY/SUBJECTIVE Recommend 30 day monitor at discharge. Plan to start brilinta. Patient in agreement.  Has right sided weakness in hand grasp and right leg. Neurologically and hemodynamically stable. Does have a deep brain stimulator for a tremor on the left hand.  MRI scan shows a 1.6 cm left frontal cortical/subcortical infarct. OBJECTIVE  CBC    Component Value Date/Time   WBC 7.1 11/09/2023 1406   RBC 3.90 (L) 11/09/2023 1406   HGB 11.5 (L) 11/09/2023 1406   HGB 12.2 (L) 06/25/2023 1000   HCT 35.9 (L) 11/09/2023 1406   HCT 37.8 06/25/2023 1000   PLT 276 11/09/2023 1406   PLT 278 06/25/2023 1000   MCV 92.1 11/09/2023 1406   MCV 90 06/25/2023 1000   MCH 29.5 11/09/2023 1406   MCHC 32.0 11/09/2023 1406   RDW 13.6 11/09/2023 1406   RDW 14.2 06/25/2023 1000   LYMPHSABS 2.5 11/09/2023 1406   MONOABS 0.6 11/09/2023 1406   EOSABS 0.2 11/09/2023 1406   BASOSABS 0.1 11/09/2023 1406    BMET    Component Value Date/Time   NA 139 11/12/2023 0705   NA 141 06/25/2023 1000   K 3.7 11/12/2023 0705   CL 109 11/12/2023 0705   CO2 24 11/12/2023 0705   GLUCOSE 169 (H) 11/12/2023 0705   BUN 37 (H) 11/12/2023 0705   BUN 28 (H) 06/25/2023 1000   CREATININE 1.99 (H) 11/12/2023 0705    CALCIUM 8.6 (L) 11/12/2023 0705   EGFR 34 (L) 06/25/2023 1000   GFRNONAA 35 (L) 11/12/2023 0705    IMAGING past 24 hours ECHOCARDIOGRAM LIMITED Result Date: 11/12/2023    ECHOCARDIOGRAM LIMITED REPORT   [name] Date of Exam: 11/12/2023 Medical Rec #:  [id number]       Height:       76.0 in Accession #:    [id number]      Weight:       243.0 lb Date of Birth:  1951-07-24       BSA:          2.406 m Patient Age:    72 years        BP:           125/87 mmHg Patient Gender: M               HR:           92 bpm. Exam Location:  Inpatient Procedure: Limited Echo, Color Doppler, Cardiac Doppler and Intracardiac            Opacification Agent Indications:    Stroke I63.9  History:        Patient has prior history of Echocardiogram examinations,  most                 recent 06/26/2023. Stroke.  Sonographer:    Joshua [name] RDCS Referring Phys: [phone] Joshua [name] IMPRESSIONS  1. The endocardial borders are not well visualized despite contrast.  2. A small pericardial effusion is present. FINDINGS  Left Ventricle: The endocardial borders are not well visualized despite contrast. Definity contrast agent was given IV to delineate the left ventricular endocardial borders. Pericardium: A small pericardial effusion is present. IVC IVC diam: 2.20 cm Joshua [name] Electronically signed by Joshua [name] Signature Date/Time: 11/12/2023/9:53:50 AM    Final     Vitals:   11/11/23 1929 11/12/23 0527 11/12/23 0820 11/12/23 0947  BP: 133/81 137/88 125/87 125/87  Pulse: 93 98 90 90  Resp: 18 18 18    Temp: 98.7 F (37.1 C) 98.4 F (36.9 C) 98.2 F (36.8 C)   TempSrc: Oral Oral Oral   SpO2: 95% 94% 95%   Weight:      Height:         PHYSICAL EXAM General:  Alert, well-nourished, well-developed patient in no acute distress Psych:  Mood and affect appropriate for situation CV: Regular rate and rhythm on monitor Respiratory:  Regular, unlabored respirations on room air GI: Abdomen soft and  nontender   NEURO:  Mental Status: AA&Ox3, patient is able to give clear and coherent history Speech/Language: speech is without dysarthria or aphasia.  Naming, repetition, fluency, and comprehension intact.  Cranial Nerves:  II: PERRL. Visual fields full.  III, IV, VI: EOMI. Eyelids elevate symmetrically.  V: Sensation is intact to light touch and symmetrical to face.  VII: Face is asymmetrical with right lower facial weakness on smiling VIII: hearing intact to voice. IX, X: Palate elevates symmetrically. Phonation is normal.  QM:VHQIONGE shrug 5/5. XII: tongue is midline without fasciculations. Motor:  RUE 4/5 - reports pain and fx at the elbow, hand grasp weak in comparison to the left hand RUE leg with drift to the bed 3/5 LLE 4/5 Tone: is normal and bulk is normal Sensation- Intact to light touch bilaterally. Extinction absent to light touch to DSS.   Coordination: FTN intact bilaterally, baseline tremor on the left  Gait- deferred  Most Recent NIH 2  ASSESSMENT/PLAN  Acute Ischemic Infarct:  Subacute left frontal infarct Etiology:  vessel disease vs embolic   Code Stroke CT head No acute abnormality.  CTA head & neck Pending  MRI  New T2 hyperintense lesion in the posterior left frontal lobe, favored to represent a late subacute infarct. 2D Echo The endocardial borders are not well visualized despite contrast.  A small pericardial effusion is present.  He had a prior echo in August 2024 showed an EF of 60 to 65% with severe left ventricular hypertrophy.  Left atrial size was normal. LDL 70 HgbA1c 6.4 VTE prophylaxis - Lovenox aspirin 81 mg daily and clopidogrel 75 mg daily prior to admission, continue aspirin and Plavix as patient will not be able to tolerate Brilinta due to shortness of breath Therapy recommendations:  SNF Disposition:  Pending  Hx of Stroke/TIA Dates back to 2013 -- most recent 04/17/2021 with lasting foot drop and mild dysarthria at baseline,  started on eliquis 5 BID at that time. In 07/2023 eliquis switched to asa and plavix which he remains on now.   Hypertension CAD Home meds:  Norvasc 10 (taking 2.5 at night), losartan 100, metoprolol tartrate 50 daily Stable Blood Pressure Goal: BP less than 220/110  Hyperlipidemia Home meds: Atorvastatin 40 mg, ezitimibe 10, fenofibrate 54 mg resumed in hospital LDL 70, goal < 70 Continue statin at discharge  Diabetes type II Controlled Home meds:  insulin glargine 70u BID, metformin HgbA1c 6.4, goal < 7.0 CBGs SSI Recommend close follow-up with PCP for better DM control  Other Stroke Risk Factors ETOH use, alcohol level <10, advised to drink no more than 2 drink(s) a day Family hx stroke  Obstructive sleep apnea  Uses CPAP Follows with [facility]   Other Active Problems BPH flomax  Hospital day # 1  Patient seen and examined by NP/APP with MD. MD to update note as needed.   Elmer Picker, DNP, FNP-BC [facility] Pager: [phone]  I have personally obtained history,examined this patient, reviewed notes, independently viewed imaging studies, participated in medical decision making and plan of care.ROS completed by me personally and pertinent positives fully documented  I have made any additions or clarifications directly to the above note. Agree with note above.  Patient presented with 5-day history of right leg weakness and MRI shows subacute left frontal large subcortical infarct etiology possibly cryptogenic versus intracranial atherosclerosis.  Patient is already on dual antiplatelet therapy.  Recommend continue aspirin and Plavix as patient will not be able to tolerate Brilinta due to being short of breath at baseline.  Maintain aggressive risk factor modification.  May consider doing TEE if possible tomorrow since 2D echo present admission is quite suboptimal.  Patient counseled to be compliant with medications and to use his CPAP every night.  Continue follow-up  with his neurologist Dr. Arbutus Leas as an outpatient.  Greater than 50% time during this 50-minute visit was spent on counseling and coordination of care and discussion patient and care team and answering questions about his stroke and leg weakness.  Delia Heady, MD Medical Director [facility] Pager: [phone] 11/12/2023 4:07 PM  To contact Stroke Continuity provider, please refer to [email or web address]. After hours, contact General Neurology

## 2023-11-12 NOTE — Progress Notes (Signed)
CPAP set up at bedside

## 2023-11-12 NOTE — Plan of Care (Signed)

## 2023-11-12 NOTE — Progress Notes (Signed)
  Echocardiogram 2D Echocardiogram has been performed.  Leda Roys RDCS 11/12/2023, 9:36 AM

## 2023-11-12 NOTE — Progress Notes (Signed)
PROGRESS NOTE    Joshua [name]  WUJ:[id number] DOB: 11-25-1950 DOA: 11/09/2023 PCP: Creola Corn, MD  Subjective: Pt seen and examined.  Pt states he found his blue magnetic for his deep brain stimulator. His wife had plugged it into a room outlet and forgot she had it this entire time.  Pt seen by neurology. No major breakthroughs on management in this difficult case.   Hospital Course: HPI: Joshua [name] is a 72 y.o. male with medical history significant of prior CVA, HTN, DM, CKD, essential tremor with deep brain stimulator, prior PE no longer on chronic AC it appears.   Pt in to ED on 12/6 with onset of BLE numbness and weakness.   The patient states when he woke up 12/15 morning he was walking to the kitchen when the backs of his legs felt numb bilaterally.   He states then around 4 PM his legs started to feel weak, worse on the right than the left. He states that he was going to the bathroom and nearly fell but was able to get himself out of the bathroom and into the bed before falling. He states that when he woke up 12/16 AM he tried to get out of bed and slipped from sitting on the edge of the bed to the ground.   He denies hitting his head. He states that he is legs felt too weak and numb to support him. He denies any associated headache or back pain. He denies any dysuria or hematuria or urinary incontinence or retention. He denies any saddle anesthesia. He states that prior to yesterday he was feeling like his normal self without any recent fevers, nausea, vomiting or diarrhea.   ED work up initially was negative AM of 12/17.  Plan was for patient to get MRI of head, L spine, and T spine given BLE weakness during day on 12/17, had to be done during day due to brain stimulator needing to be re-programed to MRI compatible mode.   Apparently however, when they tried to get MRI: "Per MRI, unable to complete scan due to internal device battery being dead. Pt returned to room for  device to charge and MRI will now have to be completed at 6am tomorrow morning."  Significant Events: Admitted 11/09/2023 for generalized weakness   Significant Labs: Admission WBC 7.1 HgB 11.5, BUN 38, scr 2.02  Significant Imaging Studies: CT head shows No acute intracranial process  MRI brain shows subacute CVA of posterior left frontal lobe   Antibiotic Therapy: Anti-infectives (From admission, onward)    None       Procedures:   Consultants:     Assessment and Plan: * CVA (cerebral vascular accident) (HCC) 11-11-2023 unfortunately, pt's MRI brain showed subacute CVA of left posterior frontal lobe. Pt was taking ASA/Plavix when he had his CVA this time.  In September 2024, pt had another while he was on Eliquis and Asa 81 mg daily.  Pt intolerant to statins. Already on zetia and fenofibrate. His LDL in September was 70. No history of afib. But he had a CVA while on Eliquis in September 2024.  Pt is in a very tough situation. He continues to have recurrent CVA despite DAPT. He had a CVA in 07-2023 while on systemic anticoagulation and ASA 81 mg daily.  Will check limited echo to make sure he doesn't have thrombus, but I doubt it.  Initially I thought that CIR may be a way for him to get rehab. Pt is  a large man. About 6 feet 4 inches. Wife states she cannot physically lift him at home.  OT has messaged me that pt wants to go to SNF now.  11-12-2023 neurology consulted. Awaiting further recs  Generalized weakness 11-11-2023 worsening since Sunday night due to leg weakness. Likely due to CVA.  11-12-2023 pt wants to go to SNF at discharge. Wife does not want CIR. CM aware.  Essential tremor 11-11-2023 Resume deep brain stimulator treatment once this is charged. Pt states this has been less and less effective since he had a CVA in the area where his deep brain stimulator is attached.  11-12-2023 stable  CKD stage 3b, GFR 30-44 ml/min (HCC) - baseline SCr 2.0 11-11-2023  stable. Will repeat CMP in AM. 11-12-2023 Scr 1.99. stable.  History of pulmonary embolism 11-11-2023 Looks like he's no longer on long term AC at this point in time other than ASA + Plavix.  Overweight (BMI 25.0-29.9) BMI 29.58  Hyperlipidemia 11-11-23 pt is statin intolerant. On zetia and fenofibrate. Last LDL in 07-2023 was 70. Will resend LDL. Check lipoprotein A. 11-12-2023 LCL 59  Essential hypertension 11-11-2023 stable. BP stable. 11-12-2023 stable  OSA on CPAP 11-11-2023 Cont CPAP per home settings 11-12-2023 stable  Diabetes mellitus type 2 in obese (HCC) 11-11-2023 continue with lantus and SSI. CBG acceptable ranges. 11-12-2023 stable. Acceptable CBG ranges       DVT prophylaxis: enoxaparin (LOVENOX) injection 40 mg Start: 11/11/23 1000    Code Status: Full Code Family Communication: no family at bedside Disposition Plan: SNF Reason for continuing need for hospitalization: ongoing neurology evaluation.  Objective: Vitals:   11/11/23 1929 11/12/23 0527 11/12/23 0820 11/12/23 0947  BP: 133/81 137/88 125/87 125/87  Pulse: 93 98 90 90  Resp: 18 18 18    Temp: 98.7 F (37.1 C) 98.4 F (36.9 C) 98.2 F (36.8 C)   TempSrc: Oral Oral Oral   SpO2: 95% 94% 95%   Weight:      Height:        Intake/Output Summary (Last 24 hours) at 11/12/2023 1053 Last data filed at 11/12/2023 0604 Gross per 24 hour  Intake --  Output 900 ml  Net -900 ml   Filed Weights   11/10/23 2137  Weight: 110.2 kg    Examination:  Physical Exam Vitals and nursing note reviewed.  Constitutional:      General: He is not in acute distress.    Appearance: He is not toxic-appearing or diaphoretic.  HENT:     Head: Normocephalic and atraumatic.     Nose: Nose normal.  Cardiovascular:     Rate and Rhythm: Normal rate and regular rhythm.  Pulmonary:     Effort: Pulmonary effort is normal.     Breath sounds: Normal breath sounds.  Abdominal:     General: Bowel sounds are  normal.     Palpations: Abdomen is soft.  Musculoskeletal:     Right lower leg: No edema.     Left lower leg: No edema.  Skin:    General: Skin is warm and dry.  Neurological:     Mental Status: He is alert and oriented to person, place, and time.     Data Reviewed: I have personally reviewed following labs and imaging studies  CBC: Recent Labs  Lab 11/09/23 1406  WBC 7.1  NEUTROABS 3.8  HGB 11.5*  HCT 35.9*  MCV 92.1  PLT 276   Basic Metabolic Panel: Recent Labs  Lab 11/09/23 1406 11/12/23 0705  NA 142 139  K 4.2 3.7  CL 109 109  CO2 23 24  GLUCOSE 90 169*  BUN 38* 37*  CREATININE 2.02* 1.99*  CALCIUM 9.3 8.6*   GFR: Estimated Creatinine Clearance: 44.3 mL/min (A) (by C-G formula based on SCr of 1.99 mg/dL (H)). Liver Function Tests: Recent Labs  Lab 11/09/23 1406 11/12/23 0705  AST 35 31  ALT 30 28  ALKPHOS 101 87  BILITOT 0.7 0.5  PROT 6.6 5.8*  ALBUMIN 3.3* 2.8*   BNP (last 3 results) Recent Labs    06/25/23 1000  BNP 27.5   HbA1C: Recent Labs    11/12/23 0705  HGBA1C 6.4*   CBG: Recent Labs  Lab 11/11/23 1029 11/11/23 1440 11/11/23 1823 11/11/23 1930 11/12/23 0821  GLUCAP 165* 170* 166* 179* 162*   Lipid Profile: Recent Labs    11/12/23 0705  CHOL 114  HDL 23*  LDLCALC 59  TRIG 191*  CHOLHDL 5.0     Radiology Studies: ECHOCARDIOGRAM LIMITED Result Date: 11/12/2023    ECHOCARDIOGRAM LIMITED REPORT   Patient Name:   [name] Date of Exam: 11/12/2023 Medical Rec #:  [id number]       Height:       76.0 in Accession #:    [id number]      Weight:       243.0 lb Date of Birth:  [birth year]       BSA:          2.406 m Patient Age:    72 years        BP:           125/87 mmHg Patient Gender: M               HR:           92 bpm. Exam Location:  Inpatient Procedure: Limited Echo, Color Doppler, Cardiac Doppler and Intracardiac            Opacification Agent Indications:    Stroke I63.9  History:        Patient has prior history  of Echocardiogram examinations, most                 recent 06/26/2023. Stroke.  Sonographer:    Harriette Bouillon RDCS Referring Phys: [phone] Pailynn Vahey IMPRESSIONS  1. The endocardial borders are not well visualized despite contrast.  2. A small pericardial effusion is present. FINDINGS  Left Ventricle: The endocardial borders are not well visualized despite contrast. Definity contrast agent was given IV to delineate the left ventricular endocardial borders. Pericardium: A small pericardial effusion is present. IVC IVC diam: 2.20 cm Carolan Clines Electronically signed by Carolan Clines Signature Date/Time: 11/12/2023/9:53:50 AM    Final    MR LUMBAR SPINE WO CONTRAST Result Date: 11/11/2023 CLINICAL DATA:  Myelopathy, acute, lumbar spine EXAM: MRI LUMBAR SPINE WITHOUT CONTRAST TECHNIQUE: Multiplanar, multisequence MR imaging of the lumbar spine was performed. No intravenous contrast was administered. COMPARISON:  None Available. FINDINGS: Segmentation:  Standard. Alignment:  Physiologic. Vertebrae:  No fracture, evidence of discitis, or bone lesion. Conus medullaris and cauda equina: Conus extends to the L1 level. Conus and cauda equina appear normal. Paraspinal and other soft tissues: Negative. Disc levels: Limitations: Note that axial sequences were not acquired this was an abbreviated exam to comply with patient's deep brain stimulator. T12-L1: No significant disc bulge. No neural foraminal narrowing. No evidence of high-grade spinal canal stenosis. L1-L2: Minimal disc bulge. No evidence of high-grade spinal  canal stenosis. No neural foraminal stenosis. L2-L3: Minimal disc bulge. No evidence of high-grade spinal canal stenosis. Mild bilateral neural foraminal narrowing. L3-L4: No significant disc bulge. No spinal canal narrowing. No neural foraminal narrowing. Mild bilateral facet degenerative change. L4-L5: No significant disc bulge. No evidence of high-grade spinal canal stenosis. Mild bilateral neural foraminal  narrowing. L5-S1: Minimal disc bulge. Mild bilateral facet degenerative change. No spinal canal narrowing. Mild bilateral neural foraminal narrowing, right-greater-than-left. IMPRESSION: Within the limitations above, mild multilevel degenerative changes without evidence of high-grade spinal canal or neural foraminal stenosis. Electronically Signed   By: Lorenza Cambridge M.D.   On: 11/11/2023 10:54   MR BRAIN WO CONTRAST Result Date: 11/11/2023 CLINICAL DATA:  Neuro deficit, acute, stroke suspected EXAM: MRI HEAD WITHOUT CONTRAST TECHNIQUE: Multiplanar, multiecho pulse sequences of the brain and surrounding structures were obtained without intravenous contrast. COMPARISON:  Brain MR 07/31/2023, CT head 11/09/2023 FINDINGS: Brain: Left-sided DBS lead in place. Compared to prior exam there is a new T2 hyperintense lesion in the posterior left frontal lobe (series 6, image 24), which is favored to represent a subacute infarct. No evidence of an acute infarct. No mass effect. No acute hemorrhage. No hydrocephalus. No extra-axial fluid collection. There is a background of moderate chronic microvascular ischemic change multiple chronic infarcts in the bilateral corona radiata. Unchanged chronic micro hemorrhage in the ventral pons. Vascular: Normal flow voids. Skull and upper cervical spine: Normal marrow signal. Sinuses/Orbits: No middle ear or mastoid effusion. Paranasal sinuses are clear. Bilateral lens replacement. Orbits are otherwise unremarkable. Other: None. IMPRESSION: 1. New T2 hyperintense lesion in the posterior left frontal lobe, favored to represent a late subacute infarct. No evidence of an acute infarct. 2.  No acute abnormality. Electronically Signed   By: Lorenza Cambridge M.D.   On: 11/11/2023 10:00    Scheduled Meds:   stroke: early stages of recovery book   Does not apply Once   amLODipine  2.5 mg Oral Daily   aspirin EC  81 mg Oral Daily   atorvastatin  40 mg Oral q1800   clopidogrel  75 mg Oral  Daily   DULoxetine  60 mg Oral BID   enoxaparin (LOVENOX) injection  40 mg Subcutaneous Q24H   ezetimibe  10 mg Oral Daily   feeding supplement  237 mL Oral BID BM   finasteride  5 mg Oral Daily   insulin aspart  0-15 Units Subcutaneous TID WC   insulin aspart  0-5 Units Subcutaneous QHS   insulin glargine-yfgn  35 Units Subcutaneous Daily   losartan  100 mg Oral Daily   metoprolol succinate  50 mg Oral Daily   mirtazapine  7.5 mg Oral QHS   tamsulosin  0.4 mg Oral QPC supper   Continuous Infusions:   LOS: 1 day   Time spent: 35 minutes  Carollee Herter, DO  [facility]  11/12/2023, 10:53 AM

## 2023-11-12 NOTE — Telephone Encounter (Signed)
-----   Message from Ronney Asters sent at 11/12/2023  4:21 PM EST ----- Regarding: RE: 30 day heart monitor- [facility] patient Marcelino Duster,  Patient has been admitted with CVA.  He was on aspirin and Plavix.  Please order a 30-day cardiac event monitor for evaluation of cardiac arrhythmia/atrial fibrillation/a flutter.  This will need to be sent to his house.  Thank you for your help.  Thomasene Ripple. Cleaver NP-C     11/12/2023, 4:22 PM [facility] [address] Office [phone] Fax [phone] ----- Message ----- From: Elmer Picker, NP Sent: 11/12/2023   4:03 PM EST To: Dewain Penning; Ronney Asters, NP; # Subject: 30 day heart monitor- [facility] patient             Hi,   Mr. Toren is currently in the hospital with another stroke while on ASA/Plavix. Would it be possible for him to get a 30 day heart monitor sent to his home. He was seen most recently in the clinic by Edd Fabian NP.   Thank you!  [employer] FNP-BC

## 2023-11-12 NOTE — Plan of Care (Signed)
  Problem: Education: Goal: Knowledge of General Education information will improve Description: Including pain rating scale, medication(s)/side effects and non-pharmacologic comfort measures Outcome: Progressing   Problem: Health Behavior/Discharge Planning: Goal: Ability to manage health-related needs will improve Outcome: Progressing   Problem: Clinical Measurements: Goal: Ability to maintain clinical measurements within normal limits will improve Outcome: Progressing Goal: Will remain free from infection Outcome: Progressing Goal: Diagnostic test results will improve Outcome: Progressing Goal: Respiratory complications will improve Outcome: Progressing Goal: Cardiovascular complication will be avoided Outcome: Progressing   Problem: Activity: Goal: Risk for activity intolerance will decrease Outcome: Progressing   Problem: Nutrition: Goal: Adequate nutrition will be maintained Outcome: Progressing   Problem: Coping: Goal: Level of anxiety will decrease Outcome: Progressing   Problem: Elimination: Goal: Will not experience complications related to bowel motility Outcome: Progressing Goal: Will not experience complications related to urinary retention Outcome: Progressing   Problem: Pain Management: Goal: General experience of comfort will improve Outcome: Progressing   Problem: Safety: Goal: Ability to remain free from injury will improve Outcome: Progressing   Problem: Skin Integrity: Goal: Risk for impaired skin integrity will decrease Outcome: Progressing   Problem: Education: Goal: Ability to describe self-care measures that may prevent or decrease complications (Diabetes Survival Skills Education) will improve Outcome: Progressing Goal: Individualized Educational Video(s) Outcome: Progressing   Problem: Coping: Goal: Ability to adjust to condition or change in health will improve Outcome: Progressing   Problem: Fluid Volume: Goal: Ability to  maintain a balanced intake and output will improve Outcome: Progressing   Problem: Health Behavior/Discharge Planning: Goal: Ability to identify and utilize available resources and services will improve Outcome: Progressing Goal: Ability to manage health-related needs will improve Outcome: Progressing   Problem: Metabolic: Goal: Ability to maintain appropriate glucose levels will improve Outcome: Progressing   Problem: Nutritional: Goal: Maintenance of adequate nutrition will improve Outcome: Progressing Goal: Progress toward achieving an optimal weight will improve Outcome: Progressing   Problem: Skin Integrity: Goal: Risk for impaired skin integrity will decrease Outcome: Progressing   Problem: Tissue Perfusion: Goal: Adequacy of tissue perfusion will improve Outcome: Progressing   Problem: Education: Goal: Knowledge of disease or condition will improve Outcome: Progressing Goal: Knowledge of secondary prevention will improve (MUST DOCUMENT ALL) Outcome: Progressing Goal: Knowledge of patient specific risk factors will improve Loraine Leriche N/A or DELETE if not current risk factor) Outcome: Progressing   Problem: Ischemic Stroke/TIA Tissue Perfusion: Goal: Complications of ischemic stroke/TIA will be minimized Outcome: Progressing   Problem: Coping: Goal: Will verbalize positive feelings about self Outcome: Progressing Goal: Will identify appropriate support needs Outcome: Progressing   Problem: Health Behavior/Discharge Planning: Goal: Ability to manage health-related needs will improve Outcome: Progressing Goal: Goals will be collaboratively established with patient/family Outcome: Progressing   Problem: Self-Care: Goal: Ability to participate in self-care as condition permits will improve Outcome: Progressing Goal: Verbalization of feelings and concerns over difficulty with self-care will improve Outcome: Progressing Goal: Ability to communicate needs accurately  will improve Outcome: Progressing   Problem: Nutrition: Goal: Risk of aspiration will decrease Outcome: Progressing Goal: Dietary intake will improve Outcome: Progressing

## 2023-11-12 NOTE — Progress Notes (Signed)
   30 day event monitor for evaluation of arrhythmia etiology of CVA. Results to Dr. Bjorn Pippin.   Perlie Gold, PA-C

## 2023-11-12 NOTE — TOC Progression Note (Addendum)
Transition of Care [facility]) - Progression Note    Patient Details  Name: Joshua [name] MRN: [id number] Date of Birth: 1951-11-10  Transition of Care [facility]) CM/SW Contact  Pheonix Clinkscale A [place], [place] Phone Number: 11/12/2023, 2:01 PM  Clinical Narrative:     Update 1145 CSW was contacted by pt's wife Larita Fife, stated she wanted to change her selection to [facility]. CSW notified facility and they said they had beds available.   Update 11/13/23 Pt and wife chose [facility], facility able to extend bed offer. CSW to start authorization for insurance closer to medical stability.   CSW met with pt and pt's wife Larita Fife at bedside. CSW provided bed offers to SNF facilities. She requested CSW follow up with [facility] as they have been their in the past for possible bed. CSW to follow with facility and update family regarding possible bed. She stated that [facility] their second choice if [facility] cannot offer.    TOC will continue to follow.        Expected Discharge Plan and Services                                               Social Determinants of Health (SDOH) Interventions SDOH Screenings   Food Insecurity: No Food Insecurity (11/11/2023)  Housing: Low Risk  (11/11/2023)  Transportation Needs: No Transportation Needs (11/11/2023)  Utilities: Not At Risk (11/11/2023)  Depression (PHQ2-9): Low Risk  (04/25/2020)  Tobacco Use: Medium Risk (11/11/2023)    Readmission Risk Interventions     No data to display

## 2023-11-12 NOTE — Progress Notes (Signed)
   Case reviewed with Dr. Pearlean Brownie. He's requesting TEE due to limited echo with contrast not providing any useful information. 30 day event recorder ordered. Neurology wants TEE but if he cannot get it tomorrow, it does not need to be a barrier to discharge. Pt has had numerous echo in the past. No hx of intra-atrial shunts.   Carollee Herter, DO [facility]

## 2023-11-12 NOTE — Evaluation (Signed)
Speech Language Pathology Evaluation Patient Details Name: Joshua [name] MRN: [id number] DOB: 05/25/1951 Today's Date: 11/12/2023 Time: 4098-1191 SLP Time Calculation (min) (ACUTE ONLY): 25 min  Problem List:  Patient Active Problem List   Diagnosis Date Noted   Generalized weakness 11/11/2023   CVA (cerebral vascular accident) (HCC) 11/11/2023   Dysarthria 07/30/2023   Other reduced mobility 05/08/2023   Dystrophia unguium 08/05/2021   Neuropathy 08/05/2021   Unilateral primary osteoarthritis, right knee 08/05/2021   History of pulmonary embolism 04/21/2021   History of stroke 04/21/2021   Bilateral hearing loss 05/15/2020   Bilateral impacted cerumen 05/15/2020   Hearing loss 03/27/2020   Melanocytic nevi, unspecified 03/27/2020   Hyperlipidemia 08/28/2019   Overweight (BMI 25.0-29.9) 08/28/2019   Tremor 07/07/2019   Lymphadenopathy 12/31/2018   Essential tremor 06/22/2017   Hardening of the aorta (main artery of the heart) (HCC) 04/15/2017   OSA on CPAP 04/10/2017   Essential hypertension 04/10/2017   Orthostatic hypotension 04/09/2017   S/P left TKA 03/30/2017   S/P total knee replacement 03/30/2017   Varicose veins of lower extremity 05/26/2016   Pre-ulcerative calluses 03/07/2016   Diabetic peripheral neuropathy associated with type 2 diabetes mellitus (HCC) 10/19/2015   Plantar wart 08/08/2015   CKD stage 3b, GFR 30-44 ml/min (HCC) - baseline SCr 2.0 10/21/2013   Late effects of cerebrovascular disease 10/21/2013   Major depression, single episode, in complete remission (HCC) 03/04/2013   Panic disorder 03/04/2013   Allergic rhinitis 01/23/2011   Diabetic renal disease (HCC) 11/29/2009   Gout 09/20/2009   Osteoarthritis 09/20/2009   Proteinuria 09/20/2009   Ventricular premature depolarization 09/20/2009   Diabetes mellitus type 2 in obese (HCC) 08/07/2008   GERD 08/07/2008   Past Medical History:  Past Medical History:  Diagnosis Date   Aphasia  09/01/2019   Benign essential tremor    Benign positional vertigo    Cerebral embolism with cerebral infarction 08/29/2019   Chronic kidney disease 08/28/2019   CVA (cerebral vascular accident) (HCC)    x2 - L retina, 1 right parietal   Degenerative arthritis    Depression    Diabetes mellitus    DVT (deep venous thrombosis) (HCC) 2018   Dyslipidemia    GERD (gastroesophageal reflux disease)    hiatal hernia   Gout    H/O: vasectomy    Hearing aid worn    b/l   Hx of appendectomy    Hx of tonsillectomy    Hypertension    Hypertrophic cardiomyopathy (HCC)    Ischemic optic neuropathy    on the left   Melanoma (HCC)    NSVT (nonsustained ventricular tachycardia) (HCC)    1 4 beat run on event monitor in 07/2020   Obesity    OSA on CPAP    setting = 5   Pulmonary emboli (HCC) 2018   PVC's (premature ventricular contractions)    SVT (supraventricular tachycardia) (HCC)    by event monitor   Tremor, essential 06/22/2017   Wears glasses    Past Surgical History:  Past Surgical History:  Procedure Laterality Date   APPENDECTOMY     arthroscopic knee surgery Bilateral    CATARACT EXTRACTION Bilateral    COLONOSCOPY     MINOR PLACEMENT OF FIDUCIAL N/A 06/30/2019   Procedure: Fiducial placement;  Surgeon: Maeola Harman, MD;  Location: [facility] OR;  Service: Neurosurgery;  Laterality: N/A;  Fiducial placement   NASAL SEPTUM SURGERY     PULSE GENERATOR IMPLANT N/A 07/14/2019   Procedure:  Left cranial Implanted Pulse Generator and lead extension placement to right chest ;  Surgeon: Maeola Harman, MD;  Location[facility] OR;  Service: Neurosurgery;  Laterality: N/A;   SUBTHALAMIC STIMULATOR INSERTION Left 07/07/2019   Procedure: LEFT DEEP BRAIN STIMULATOR PLACEMENT;  Surgeon: Maeola Harman, MD;  Location: [facility] OR;  Service: Neurosurgery;  Laterality: Left;   TEE WITHOUT CARDIOVERSION N/A 04/23/2021   Procedure: TRANSESOPHAGEAL ECHOCARDIOGRAM (TEE);  Surgeon: Elease Hashimoto Deloris Ping, MD;  Location: [facility]  ENDOSCOPY;  Service: Cardiovascular;  Laterality: N/A;   TONSILLECTOMY     TOTAL KNEE ARTHROPLASTY Left 03/30/2017   Procedure: LEFT TOTAL KNEE ARTHROPLASTY;  Surgeon: Durene Romans, MD;  Location: [facility] ORS;  Service: Orthopedics;  Laterality: Left;   WISDOM TOOTH EXTRACTION     HPI:  Patient is a 72 y.o. male with PMH: prior CVA with associated dysphagia (most recent MBS in 2022) and dysarthria. (most recent evaluation in 2020), HTN, DM, CKD, essential tremor with deep brain stimulator, prior PE but no longer on chronic AC. He presented to the ED on 12/16 with onset of BLE numbness and weakness. MRI brain showed New T2 hyperintense lesion in the posterior left frontal lobe, favored to represent a late subacute infarct. No evidence of an acute infarct.   Assessment / Plan / Recommendation Clinical Impression  Patient is presenting with a mild cognitive-linguistic impairment as per this evaluation. After talking to son, patient's mild dysarthria is baseline since CVA a few years ago but cognitive impairment has been progressing "rapidly" since approximately July/August. Son reports that patient has been having memory and other cognitive changes including agitation. Son does not think patient has been assessed for dementia but stated that patient's father did have diagnosis of Alzheimer's. SLP assessed patient via the portions of the Cognistat, scoring in Average range for orientation, attention, registration, language but scoring in mild impairment for memory and reasoning (similarities). Patient was verbose when describing and when responding to open-ended hypothetical questions. Recommendation is for skilled SLP services at next venue of care (likely SNF). In addition, he may benefit from more comprehensive cognitive evaluation secondary to son's reports of agitation, memory impairment that has been progressing, leading to SLP concern for underlying dementia.    SLP Assessment  SLP  Recommendation/Assessment: All further Speech Lanaguage Pathology  needs can be addressed in the next venue of care SLP Visit Diagnosis: Cognitive communication deficit (R41.841)    Recommendations for follow up therapy are one component of a multi-disciplinary discharge planning process, led by the attending physician.  Recommendations may be updated based on patient status, additional functional criteria and insurance authorization.    Follow Up Recommendations  Skilled nursing-short term rehab (<3 hours/day)    Assistance Recommended at Discharge  Intermittent Supervision/Assistance  Functional Status Assessment Patient has had a recent decline in their functional status and demonstrates the ability to make significant improvements in function in a reasonable and predictable amount of time.  Frequency and Duration           SLP Evaluation Cognition  Overall Cognitive Status: Difficult to assess Arousal/Alertness: Awake/alert Orientation Level: Oriented to person;Oriented to place;Disoriented to time Year: 2024 Month: December Day of Week: Incorrect Attention: Sustained Sustained Attention: Appears intact Memory: Impaired Memory Impairment: Retrieval deficit;Decreased recall of new information Awareness: Impaired Awareness Impairment: Emergent impairment;Anticipatory impairment Problem Solving:  (intact at basic level) Safety/Judgment: Impaired       Comprehension  Auditory Comprehension Overall Auditory Comprehension: Appears within functional limits for tasks assessed  Expression Expression Primary Mode of Expression: Verbal Verbal Expression Overall Verbal Expression: Impaired Initiation: No impairment Repetition: No impairment Naming: No impairment Pragmatics: Impairment Impairments: Topic maintenance;Other (comment) Interfering Components: Premorbid deficit Effective Techniques: Open ended questions Non-Verbal Means of Communication: Not applicable   Oral /  Motor  Oral Motor/Sensory Function Overall Oral Motor/Sensory Function: Mild impairment Facial ROM: Within Functional Limits Facial Symmetry: Within Functional Limits Facial Strength: Within Functional Limits Facial Sensation: Within Functional Limits Lingual ROM: Within Functional Limits Lingual Symmetry: Within Functional Limits Lingual Strength: Reduced Lingual Sensation: Within Functional Limits Velum: Within Functional Limits Motor Speech Overall Motor Speech: Impaired at baseline Phonation: Normal Resonance: Within functional limits Articulation: Impaired Level of Impairment: Conversation Intelligibility: Intelligibility reduced Word: 75-100% accurate Phrase: 75-100% accurate Sentence: 75-100% accurate Conversation: 75-100% accurate Motor Planning: Witnin functional limits Motor Speech Errors: Not applicable Interfering Components: Premorbid status Effective Techniques: Slow rate            Angela Nevin, MA, CCC-SLP Speech Therapy

## 2023-11-12 NOTE — TOC Progression Note (Signed)
Transition of Care [facility]) - Progression Note    Patient Details  Name: [name] MRN: [id number] Date of Birth: 1951-03-07  Transition of Care [facility]) CM/SW Contact  Janae Bridgeman, RN Phone Number: 11/12/2023, 1:49 PM  Clinical Narrative:    [facility] and [facility] to [facility]. Patient's MSW at [facility] is [place], MSW [phone], [phone].  Dr. Remi Deter is primary MD at the [facility].  [place], MSW will follow up with wife at the bedside to discuss SNF placement/ bed offers.        Expected Discharge Plan and Services                                               Social Determinants of Health (SDOH) Interventions SDOH Screenings   Food Insecurity: No Food Insecurity (11/11/2023)  Housing: Low Risk  (11/11/2023)  Transportation Needs: No Transportation Needs (11/11/2023)  Utilities: Not At Risk (11/11/2023)  Depression (PHQ2-9): Low Risk  (04/25/2020)  Tobacco Use: Medium Risk (11/11/2023)    Readmission Risk Interventions     No data to display

## 2023-11-12 NOTE — Plan of Care (Signed)
Problem: Education: Goal: Knowledge of General Education information will improve Description: Including pain rating scale, medication(s)/side effects and non-pharmacologic comfort measures 11/12/2023 1737 by Cathleen Corti, RN Outcome: Progressing 11/12/2023 1657 by Cathleen Corti, RN Outcome: Progressing   Problem: Health Behavior/Discharge Planning: Goal: Ability to manage health-related needs will improve 11/12/2023 1737 by Cathleen Corti, RN Outcome: Progressing 11/12/2023 1657 by Cathleen Corti, RN Outcome: Progressing   Problem: Clinical Measurements: Goal: Ability to maintain clinical measurements within normal limits will improve 11/12/2023 1737 by Cathleen Corti, RN Outcome: Progressing 11/12/2023 1657 by Cathleen Corti, RN Outcome: Progressing Goal: Will remain free from infection 11/12/2023 1737 by Cathleen Corti, RN Outcome: Progressing 11/12/2023 1657 by Cathleen Corti, RN Outcome: Progressing Goal: Diagnostic test results will improve 11/12/2023 1737 by Cathleen Corti, RN Outcome: Progressing 11/12/2023 1657 by Cathleen Corti, RN Outcome: Progressing Goal: Respiratory complications will improve 11/12/2023 1737 by Cathleen Corti, RN Outcome: Progressing 11/12/2023 1657 by Cathleen Corti, RN Outcome: Progressing Goal: Cardiovascular complication will be avoided 11/12/2023 1737 by Cathleen Corti, RN Outcome: Progressing 11/12/2023 1657 by Cathleen Corti, RN Outcome: Progressing   Problem: Activity: Goal: Risk for activity intolerance will decrease 11/12/2023 1737 by Cathleen Corti, RN Outcome: Progressing 11/12/2023 1657 by Cathleen Corti, RN Outcome: Progressing   Problem: Nutrition: Goal: Adequate nutrition will be maintained 11/12/2023 1737 by Cathleen Corti, RN Outcome: Progressing 11/12/2023 1657 by Cathleen Corti, RN Outcome: Progressing   Problem: Coping: Goal: Level  of anxiety will decrease 11/12/2023 1737 by Cathleen Corti, RN Outcome: Progressing 11/12/2023 1657 by Cathleen Corti, RN Outcome: Progressing   Problem: Elimination: Goal: Will not experience complications related to bowel motility 11/12/2023 1737 by Cathleen Corti, RN Outcome: Progressing 11/12/2023 1657 by Cathleen Corti, RN Outcome: Progressing Goal: Will not experience complications related to urinary retention 11/12/2023 1737 by Cathleen Corti, RN Outcome: Progressing 11/12/2023 1657 by Cathleen Corti, RN Outcome: Progressing   Problem: Pain Management: Goal: General experience of comfort will improve 11/12/2023 1737 by Cathleen Corti, RN Outcome: Progressing 11/12/2023 1657 by Cathleen Corti, RN Outcome: Progressing   Problem: Safety: Goal: Ability to remain free from injury will improve 11/12/2023 1737 by Cathleen Corti, RN Outcome: Progressing 11/12/2023 1657 by Cathleen Corti, RN Outcome: Progressing   Problem: Skin Integrity: Goal: Risk for impaired skin integrity will decrease 11/12/2023 1737 by Cathleen Corti, RN Outcome: Progressing 11/12/2023 1657 by Cathleen Corti, RN Outcome: Progressing   Problem: Education: Goal: Ability to describe self-care measures that may prevent or decrease complications (Diabetes Survival Skills Education) will improve 11/12/2023 1737 by Cathleen Corti, RN Outcome: Progressing 11/12/2023 1657 by Cathleen Corti, RN Outcome: Progressing Goal: Individualized Educational Video(s) 11/12/2023 1737 by Cathleen Corti, RN Outcome: Progressing 11/12/2023 1657 by Cathleen Corti, RN Outcome: Progressing   Problem: Coping: Goal: Ability to adjust to condition or change in health will improve 11/12/2023 1737 by Cathleen Corti, RN Outcome: Progressing 11/12/2023 1657 by Cathleen Corti, RN Outcome: Progressing   Problem: Fluid Volume: Goal: Ability to maintain  a balanced intake and output will improve 11/12/2023 1737 by Cathleen Corti, RN Outcome: Progressing 11/12/2023 1657 by Cathleen Corti, RN Outcome: Progressing   Problem: Health Behavior/Discharge Planning: Goal: Ability to identify and utilize available resources and services will improve 11/12/2023 1737 by Cathleen Corti, RN Outcome: Progressing 11/12/2023 1657 by Cathleen Corti,  RN Outcome: Progressing Goal: Ability to manage health-related needs will improve 11/12/2023 1737 by Cathleen Corti, RN Outcome: Progressing 11/12/2023 1657 by Cathleen Corti, RN Outcome: Progressing   Problem: Metabolic: Goal: Ability to maintain appropriate glucose levels will improve 11/12/2023 1737 by Cathleen Corti, RN Outcome: Progressing 11/12/2023 1657 by Cathleen Corti, RN Outcome: Progressing   Problem: Nutritional: Goal: Maintenance of adequate nutrition will improve 11/12/2023 1737 by Cathleen Corti, RN Outcome: Progressing 11/12/2023 1657 by Cathleen Corti, RN Outcome: Progressing Goal: Progress toward achieving an optimal weight will improve 11/12/2023 1737 by Cathleen Corti, RN Outcome: Progressing 11/12/2023 1657 by Cathleen Corti, RN Outcome: Progressing   Problem: Skin Integrity: Goal: Risk for impaired skin integrity will decrease 11/12/2023 1737 by Cathleen Corti, RN Outcome: Progressing 11/12/2023 1657 by Cathleen Corti, RN Outcome: Progressing   Problem: Tissue Perfusion: Goal: Adequacy of tissue perfusion will improve 11/12/2023 1737 by Cathleen Corti, RN Outcome: Progressing 11/12/2023 1657 by Cathleen Corti, RN Outcome: Progressing   Problem: Education: Goal: Knowledge of disease or condition will improve 11/12/2023 1737 by Cathleen Corti, RN Outcome: Progressing 11/12/2023 1657 by Cathleen Corti, RN Outcome: Progressing Goal: Knowledge of secondary prevention will improve (MUST DOCUMENT  ALL) 11/12/2023 1737 by Cathleen Corti, RN Outcome: Progressing 11/12/2023 1657 by Cathleen Corti, RN Outcome: Progressing Goal: Knowledge of patient specific risk factors will improve Loraine Leriche N/A or DELETE if not current risk factor) 11/12/2023 1737 by Cathleen Corti, RN Outcome: Progressing 11/12/2023 1657 by Cathleen Corti, RN Outcome: Progressing   Problem: Ischemic Stroke/TIA Tissue Perfusion: Goal: Complications of ischemic stroke/TIA will be minimized 11/12/2023 1737 by Cathleen Corti, RN Outcome: Progressing 11/12/2023 1657 by Cathleen Corti, RN Outcome: Progressing   Problem: Coping: Goal: Will verbalize positive feelings about self 11/12/2023 1737 by Cathleen Corti, RN Outcome: Progressing 11/12/2023 1657 by Cathleen Corti, RN Outcome: Progressing Goal: Will identify appropriate support needs 11/12/2023 1737 by Cathleen Corti, RN Outcome: Progressing 11/12/2023 1657 by Cathleen Corti, RN Outcome: Progressing   Problem: Health Behavior/Discharge Planning: Goal: Ability to manage health-related needs will improve 11/12/2023 1737 by Cathleen Corti, RN Outcome: Progressing 11/12/2023 1657 by Cathleen Corti, RN Outcome: Progressing Goal: Goals will be collaboratively established with patient/family 11/12/2023 1737 by Cathleen Corti, RN Outcome: Progressing 11/12/2023 1657 by Cathleen Corti, RN Outcome: Progressing   Problem: Self-Care: Goal: Ability to participate in self-care as condition permits will improve 11/12/2023 1737 by Cathleen Corti, RN Outcome: Progressing 11/12/2023 1657 by Cathleen Corti, RN Outcome: Progressing Goal: Verbalization of feelings and concerns over difficulty with self-care will improve 11/12/2023 1737 by Cathleen Corti, RN Outcome: Progressing 11/12/2023 1657 by Cathleen Corti, RN Outcome: Progressing Goal: Ability to communicate needs accurately will  improve 11/12/2023 1737 by Cathleen Corti, RN Outcome: Progressing 11/12/2023 1657 by Cathleen Corti, RN Outcome: Progressing   Problem: Nutrition: Goal: Risk of aspiration will decrease 11/12/2023 1737 by Cathleen Corti, RN Outcome: Progressing 11/12/2023 1657 by Cathleen Corti, RN Outcome: Progressing Goal: Dietary intake will improve 11/12/2023 1737 by Cathleen Corti, RN Outcome: Progressing 11/12/2023 1657 by Cathleen Corti, RN Outcome: Progressing

## 2023-11-12 NOTE — Progress Notes (Signed)
  [facility] asked to complete TEE for evaluation in the setting of CVA.   Informed Consent   Shared Decision Making/Informed Consent   The risks [esophageal damage, perforation (1:10,000 risk), bleeding, pharyngeal hematoma as well as other potential complications associated with conscious sedation including aspiration, arrhythmia, respiratory failure and death], benefits (treatment guidance and diagnostic support) and alternatives of a transesophageal echocardiogram were discussed in detail with Joshua [name] and he is willing to proceed.

## 2023-11-13 ENCOUNTER — Inpatient Hospital Stay (HOSPITAL_COMMUNITY): Payer: No Typology Code available for payment source | Admitting: Certified Registered Nurse Anesthetist

## 2023-11-13 ENCOUNTER — Inpatient Hospital Stay (HOSPITAL_COMMUNITY): Payer: No Typology Code available for payment source

## 2023-11-13 ENCOUNTER — Encounter (HOSPITAL_COMMUNITY)
Admission: EM | Disposition: A | Discharge status: Skilled Nursing Facility | Payer: Self-pay | Source: Home / Self Care | Attending: Internal Medicine

## 2023-11-13 DIAGNOSIS — I10 Essential (primary) hypertension: Secondary | ICD-10-CM

## 2023-11-13 DIAGNOSIS — Q2112 Patent foramen ovale: Secondary | ICD-10-CM | POA: Diagnosis not present

## 2023-11-13 DIAGNOSIS — Z794 Long term (current) use of insulin: Secondary | ICD-10-CM

## 2023-11-13 DIAGNOSIS — E119 Type 2 diabetes mellitus without complications: Secondary | ICD-10-CM | POA: Diagnosis not present

## 2023-11-13 DIAGNOSIS — I639 Cerebral infarction, unspecified: Secondary | ICD-10-CM

## 2023-11-13 DIAGNOSIS — R531 Weakness: Secondary | ICD-10-CM | POA: Diagnosis not present

## 2023-11-13 DIAGNOSIS — I350 Nonrheumatic aortic (valve) stenosis: Secondary | ICD-10-CM | POA: Diagnosis not present

## 2023-11-13 DIAGNOSIS — G250 Essential tremor: Secondary | ICD-10-CM | POA: Diagnosis not present

## 2023-11-13 DIAGNOSIS — I6300 Cerebral infarction due to thrombosis of unspecified precerebral artery: Secondary | ICD-10-CM

## 2023-11-13 HISTORY — PX: TRANSESOPHAGEAL ECHOCARDIOGRAM (CATH LAB): EP1270

## 2023-11-13 LAB — ECHO TEE
AR max vel: 2.03 cm2
AV Area VTI: 1.8 cm2
AV Area mean vel: 2.01 cm2
AV Mean grad: 7 mm[Hg]
AV Peak grad: 12.4 mm[Hg]
Ao pk vel: 1.76 m/s

## 2023-11-13 LAB — GLUCOSE, CAPILLARY
Glucose-Capillary: 166 mg/dL — ABNORMAL HIGH (ref 70–99)
Glucose-Capillary: 208 mg/dL — ABNORMAL HIGH (ref 70–99)
Glucose-Capillary: 129 mg/dL — ABNORMAL HIGH (ref 70–99)
Glucose-Capillary: 156 mg/dL — ABNORMAL HIGH (ref 70–99)

## 2023-11-13 LAB — LIPOPROTEIN A (LPA): Lipoprotein (a): 157.9 nmol/L — ABNORMAL HIGH (ref <75.0)

## 2023-11-13 SURGERY — TRANSESOPHAGEAL ECHOCARDIOGRAM (TEE) (CATHLAB)
Anesthesia: Monitor Anesthesia Care

## 2023-11-13 MED ORDER — SODIUM CHLORIDE 0.9% FLUSH: 3.0000 mL | Freq: Two times a day (BID) | INTRAVENOUS | Status: DC

## 2023-11-13 MED ORDER — CLOPIDOGREL BISULFATE 75 MG PO TABS
75.0000 mg | ORAL_TABLET | Freq: Every day | ORAL | Status: DC
  Administered 2023-11-14: 75 mg via ORAL
  Filled 2023-11-13 (×2): qty 1

## 2023-11-13 MED ORDER — PROPOFOL 500 MG/50ML IV EMUL
INTRAVENOUS | Status: DC | PRN
  Administered 2023-11-13: 75 ug/kg/min via INTRAVENOUS

## 2023-11-13 MED ORDER — PROPOFOL 10 MG/ML IV BOLUS
INTRAVENOUS | Status: DC | PRN
  Administered 2023-11-13: 10 mg via INTRAVENOUS
  Administered 2023-11-13: 30 mg via INTRAVENOUS
  Administered 2023-11-13: 10 mg via INTRAVENOUS

## 2023-11-13 MED ORDER — LIDOCAINE HCL (CARDIAC) PF 100 MG/5ML IV SOSY
PREFILLED_SYRINGE | INTRAVENOUS | Status: DC | PRN
  Administered 2023-11-13: 100 mg via INTRATRACHEAL

## 2023-11-13 MED ORDER — SODIUM CHLORIDE 0.9% FLUSH: 3.0000 mL | INTRAVENOUS | Status: DC | PRN

## 2023-11-13 MED ORDER — SODIUM CHLORIDE 0.9 % IV SOLN: INTRAVENOUS | Status: DC | PRN

## 2023-11-13 NOTE — Plan of Care (Signed)
  Problem: Education: Goal: Knowledge of General Education information will improve Description: Including pain rating scale, medication(s)/side effects and non-pharmacologic comfort measures Outcome: Progressing   Problem: Health Behavior/Discharge Planning: Goal: Ability to manage health-related needs will improve Outcome: Progressing   Problem: Clinical Measurements: Goal: Ability to maintain clinical measurements within normal limits will improve Outcome: Progressing Goal: Will remain free from infection Outcome: Progressing Goal: Diagnostic test results will improve Outcome: Progressing Goal: Respiratory complications will improve Outcome: Progressing Goal: Cardiovascular complication will be avoided Outcome: Progressing   Problem: Activity: Goal: Risk for activity intolerance will decrease Outcome: Progressing   Problem: Nutrition: Goal: Adequate nutrition will be maintained Outcome: Progressing   Problem: Coping: Goal: Level of anxiety will decrease Outcome: Progressing   Problem: Elimination: Goal: Will not experience complications related to bowel motility Outcome: Progressing Goal: Will not experience complications related to urinary retention Outcome: Progressing   Problem: Pain Management: Goal: General experience of comfort will improve Outcome: Progressing   Problem: Safety: Goal: Ability to remain free from injury will improve Outcome: Progressing   Problem: Skin Integrity: Goal: Risk for impaired skin integrity will decrease Outcome: Progressing   Problem: Education: Goal: Ability to describe self-care measures that may prevent or decrease complications (Diabetes Survival Skills Education) will improve Outcome: Progressing Goal: Individualized Educational Video(s) Outcome: Progressing   Problem: Coping: Goal: Ability to adjust to condition or change in health will improve Outcome: Progressing   Problem: Fluid Volume: Goal: Ability to  maintain a balanced intake and output will improve Outcome: Progressing   Problem: Health Behavior/Discharge Planning: Goal: Ability to identify and utilize available resources and services will improve Outcome: Progressing Goal: Ability to manage health-related needs will improve Outcome: Progressing   Problem: Metabolic: Goal: Ability to maintain appropriate glucose levels will improve Outcome: Progressing   Problem: Nutritional: Goal: Maintenance of adequate nutrition will improve Outcome: Progressing Goal: Progress toward achieving an optimal weight will improve Outcome: Progressing   Problem: Skin Integrity: Goal: Risk for impaired skin integrity will decrease Outcome: Progressing   Problem: Tissue Perfusion: Goal: Adequacy of tissue perfusion will improve Outcome: Progressing  Patient is alert and oriented x 4 continue  continue on room air. Denies chest pain, chest pressure or sob. Afebrile.  TEE completed today.  No acute distress noted  during shift. Bipap  at bedside.  Safety precaution, bed lock in lowest position, call light place at reach.  Family at bedside.  Problem: Education: Goal: Knowledge of disease or condition will improve Outcome: Progressing Goal: Knowledge of secondary prevention will improve (MUST DOCUMENT ALL) Outcome: Progressing Goal: Knowledge of patient specific risk factors will improve Loraine Leriche N/A or DELETE if not current risk factor) Outcome: Progressing   Problem: Ischemic Stroke/TIA Tissue Perfusion: Goal: Complications of ischemic stroke/TIA will be minimized Outcome: Progressing   Problem: Coping: Goal: Will verbalize positive feelings about self Outcome: Progressing Goal: Will identify appropriate support needs Outcome: Progressing   Problem: Health Behavior/Discharge Planning: Goal: Ability to manage health-related needs will improve Outcome: Progressing Goal: Goals will be collaboratively established with patient/family Outcome:  Progressing   Problem: Self-Care: Goal: Ability to participate in self-care as condition permits will improve Outcome: Progressing Goal: Verbalization of feelings and concerns over difficulty with self-care will improve Outcome: Progressing Goal: Ability to communicate needs accurately will improve Outcome: Progressing   Problem: Nutrition: Goal: Risk of aspiration will decrease Outcome: Progressing Goal: Dietary intake will improve Outcome: Progressing

## 2023-11-13 NOTE — Plan of Care (Signed)

## 2023-11-13 NOTE — CV Procedure (Signed)
TEE: Anesthesia: Propofol  Small PFO with positive bubble study No LAA thrombus LAE Moderate LVH with SAM no significant LVOT gradient Calcified tri leaflet AV with mild AS Trivial MR Normal RV No effusion   See full report in [place]  Charlton Haws MD [facility]

## 2023-11-13 NOTE — Anesthesia Preprocedure Evaluation (Addendum)
Anesthesia Evaluation  Patient identified by MRN, date of birth, ID band Patient awake    Reviewed: Allergy & Precautions, NPO status , Patient's Chart, lab work & pertinent test results  Airway Mallampati: II  TM Distance: >3 FB Neck ROM: Full    Dental  (+) Teeth Intact, Dental Advisory Given   Pulmonary sleep apnea and Continuous Positive Airway Pressure Ventilation , former smoker   breath sounds clear to auscultation       Cardiovascular hypertension, Pt. on medications and Pt. on home beta blockers  Rhythm:Regular Rate:Normal  Echo: 1. Left ventricular ejection fraction, by estimation, is 60 to 65%. The  left ventricle has normal function. The left ventricle has no regional  wall motion abnormalities. There is severe left ventricular hypertrophy of  the basal-septal segment. Left  ventricular diastolic parameters are consistent with Grade I diastolic  dysfunction (impaired relaxation).   2. Right ventricular systolic function is normal. The right ventricular  size is normal.   3. The mitral valve is normal in structure. No evidence of mitral valve  regurgitation. No evidence of mitral stenosis.   4. The aortic valve is tricuspid. There is moderate calcification of the  aortic valve. Aortic valve regurgitation is not visualized. Aortic valve  sclerosis is present, with no evidence of aortic valve stenosis.   5. Aortic dilatation noted. There is mild dilatation of the ascending  aorta, measuring 43 mm.   6. The inferior vena cava is normal in size with greater than 50%  respiratory variability, suggesting right atrial pressure of 3 mmHg.     Neuro/Psych  PSYCHIATRIC DISORDERS Anxiety Depression     Neuromuscular disease CVA    GI/Hepatic Neg liver ROS,GERD  ,,  Endo/Other  diabetes, Type 2, Insulin Dependent, Oral Hypoglycemic Agents    Renal/GU Renal disease     Musculoskeletal  (+) Arthritis ,    Abdominal    Peds  Hematology   Anesthesia Other Findings   Reproductive/Obstetrics                             Anesthesia Physical Anesthesia Plan  ASA: 3  Anesthesia Plan: MAC   Post-op Pain Management: Minimal or no pain anticipated   Induction: Intravenous  PONV Risk Score and Plan: 0 and Propofol infusion  Airway Management Planned: Natural Airway and Nasal Cannula  Additional Equipment: None  Intra-op Plan:   Post-operative Plan:   Informed Consent: I have reviewed the patients History and Physical, chart, labs and discussed the procedure including the risks, benefits and alternatives for the proposed anesthesia with the patient or authorized representative who has indicated his/her understanding and acceptance.       Plan Discussed with: CRNA  Anesthesia Plan Comments:        Anesthesia Quick Evaluation

## 2023-11-13 NOTE — Progress Notes (Signed)
OT Cancellation Note  Patient Details Name: Joshua [name] MRN: [id number] DOB: 03/05/1951   Cancelled Treatment:    Reason Eval/Treat Not Completed: Patient at procedure or test/ unavailable. Pt at TEE this am when OT attemped. Will check back as schedule allows.   Hope Budds 11/13/2023, 11:47 AM

## 2023-11-13 NOTE — Interval H&P Note (Signed)
History and Physical Interval Note:  11/13/2023 8:26 AM  Joshua [name]  has presented today for surgery, with the diagnosis of CVA.  The various methods of treatment have been discussed with the patient and family. After consideration of risks, benefits and other options for treatment, the patient has consented to  Procedure(s): TRANSESOPHAGEAL ECHOCARDIOGRAM (N/A) as a surgical intervention.  The patient's history has been reviewed, patient examined, no change in status, stable for surgery.  I have reviewed the patient's chart and labs.  Questions were answered to the patient's satisfaction.     Charlton Haws

## 2023-11-13 NOTE — Transfer of Care (Signed)
Immediate Anesthesia Transfer of Care Note  Patient: Joshua [name]  Procedure(s) Performed: TRANSESOPHAGEAL ECHOCARDIOGRAM  Patient Location: PACU  Anesthesia Type:MAC  Level of Consciousness: awake, alert , oriented, drowsy, and patient cooperative  Airway & Oxygen Therapy: Patient Spontanous Breathing  Post-op Assessment: Report given to RN and Post -op Vital signs reviewed and stable  Post vital signs: Reviewed and stable  Last Vitals:  Vitals Value Taken Time  BP 112/77 0926  Temp    Pulse 93 0926  Resp 16 0926  SpO2 96% 0926    Last Pain:  Vitals:   11/13/23 0752  TempSrc:   PainSc: 0-No pain         Complications: There were no known notable events for this encounter.

## 2023-11-13 NOTE — Progress Notes (Signed)
PT Cancellation Note  Patient Details Name: Joshua [name] MRN: [id number] DOB: 1951/11/15   Cancelled Treatment:    Reason Eval/Treat Not Completed: Patient at procedure or test/unavailable  Currently off unit for echo. Will check back this afternoon as schedule permits.   Kathlyn Sacramento, PT, DPT [facility]  Rehabilitation Services Physical Therapist Office: [phone] Website: [email or web address]  Joshua [name] 11/13/2023, 8:33 AM

## 2023-11-13 NOTE — Anesthesia Postprocedure Evaluation (Signed)
Anesthesia Post Note  Patient: OCIEL MIZE  Procedure(s) Performed: TRANSESOPHAGEAL ECHOCARDIOGRAM     Patient location during evaluation: PACU Anesthesia Type: MAC Level of consciousness: awake and alert Pain management: pain level controlled Vital Signs Assessment: post-procedure vital signs reviewed and stable Respiratory status: spontaneous breathing, nonlabored ventilation, respiratory function stable and patient connected to nasal cannula oxygen Cardiovascular status: stable and blood pressure returned to baseline Postop Assessment: no apparent nausea or vomiting Anesthetic complications: no  There were no known notable events for this encounter.  Last Vitals:  Vitals:   11/13/23 0950 11/13/23 1033  BP: 129/77 135/85  Pulse: 92 89  Resp: 18 17  Temp:    SpO2: 97% 97%    Last Pain:  Vitals:   11/13/23 1033  TempSrc: Oral  PainSc: 0-No pain                 Shelton Silvas

## 2023-11-13 NOTE — Assessment & Plan Note (Signed)
11-13-2023 I have reviewed all of pt's prior surface echos and 1 TEE in 03-2021. None report any intra-atrial shunts. Today's TEE shows small PFO. Neurology wants transcranial dopplers with bubble study.  Depending on this result, pt may need to go back on systemic anticoagulation, possible referral to cards for PFO closure, etc. Neurology to f/u on TCD and further recommendations.  11-14-2023 awaiting for neurology to perform TCD with bubble study.  11-15-2023 TCD with bubble yesterday showed no significant right to left shunting.  Dr. Pearlean Brownie does not think pt is a candidate for PFO closure. Pt will need to remain on life long systemic anticoagulation due to recurrent DVTs.

## 2023-11-13 NOTE — Progress Notes (Signed)
PT Cancellation Note  Patient Details Name: Joshua [name] MRN: [id number] DOB: 03-10-1951   Cancelled Treatment:    Reason Eval/Treat Not Completed: Patient declined. Second attempt to work with pt today after he requested follow-up later this afternoon.  Pt declines again, citing several excuses; tired, anxious, received late meal.  Not agreeable to get OOB, nor perform lighter activities at EOB. Educated on importance of mobility and to get OOB with staff when offered to avoid secondary complications associated with immobility, especially progressive weakness.   Kathlyn Sacramento, PT, DPT [facility]  Rehabilitation Services Physical Therapist Office: [phone] Website: [email or web address]  Berton Mount 11/13/2023, 2:47 PM

## 2023-11-13 NOTE — Progress Notes (Signed)
PROGRESS NOTE    [name]  ZOX:[id number] DOB: Feb 23, 1951 DOA: 11/09/2023 PCP: Creola Corn, MD  Subjective: Pt seen and examined.  Pt had TEE today. Found a small PFO. He states one of his grand-dtr also has a PFO.  All of his prior echos including a TEE in 03-2021 did not find any intra-atrial shunts.    Hospital Course: HPI: Joshua [name] is a 72 y.o. male with medical history significant of prior CVA, HTN, DM, CKD, essential tremor with deep brain stimulator, prior PE no longer on chronic AC it appears.   Pt in to ED on 12/6 with onset of BLE numbness and weakness.   The patient states when he woke up 12/15 morning he was walking to the kitchen when the backs of his legs felt numb bilaterally.   He states then around 4 PM his legs started to feel weak, worse on the right than the left. He states that he was going to the bathroom and nearly fell but was able to get himself out of the bathroom and into the bed before falling. He states that when he woke up 12/16 AM he tried to get out of bed and slipped from sitting on the edge of the bed to the ground.   He denies hitting his head. He states that he is legs felt too weak and numb to support him. He denies any associated headache or back pain. He denies any dysuria or hematuria or urinary incontinence or retention. He denies any saddle anesthesia. He states that prior to yesterday he was feeling like his normal self without any recent fevers, nausea, vomiting or diarrhea.   ED work up initially was negative AM of 12/17.  Plan was for patient to get MRI of head, L spine, and T spine given BLE weakness during day on 12/17, had to be done during day due to brain stimulator needing to be re-programed to MRI compatible mode.   Apparently however, when they tried to get MRI: "Per MRI, unable to complete scan due to internal device battery being dead. Pt returned to room for device to charge and MRI will now have to be completed at 6am  tomorrow morning."  Significant Events: Admitted 11/09/2023 for generalized weakness   Significant Labs: Admission WBC 7.1 HgB 11.5, BUN 38, scr 2.02  Significant Imaging Studies: CT head shows No acute intracranial process  MRI brain shows subacute CVA of posterior left frontal lobe   Antibiotic Therapy: Anti-infectives (From admission, onward)    None       Procedures:   Consultants:     Assessment and Plan: * CVA (cerebral vascular accident) (HCC) 11-11-2023 unfortunately, pt's MRI brain showed subacute CVA of left posterior frontal lobe. Pt was taking ASA/Plavix when he had his CVA this time.  In September 2024, pt had another while he was on Eliquis and Asa 81 mg daily.  Pt intolerant to statins. Already on zetia and fenofibrate. His LDL in September was 70. No history of afib. But he had a CVA while on Eliquis in September 2024.  Pt is in a very tough situation. He continues to have recurrent CVA despite DAPT. He had a CVA in 07-2023 while on systemic anticoagulation and ASA 81 mg daily.  Will check limited echo to make sure he doesn't have thrombus, but I doubt it.  Initially I thought that CIR may be a way for him to get rehab. Pt is a large man. About 6 feet 4  inches. Wife states she cannot physically lift him at home.  OT has messaged me that pt wants to go to SNF now.  11-12-2023 neurology consulted. Awaiting further recs  11-13-2023 TEE showed PFO. Neurology wants transcranial dopplers with bubble study and LE U/S to r/o DVT.  Pt did have a subclavian vein DVT around 03-2021. It's possible he could have thrown a thrombus thru his PFO that caused his CVA.  The TCD with bubble should answer the question if the PFO is significant enough.  PFO (patent foramen ovale) 11-13-2023 I have reviewed all of pt's prior surface echos and 1 TEE in 03-2021. None report any intra-atrial shunts. Today's TEE shows small PFO. Neurology wants transcranial dopplers with bubble study.   Depending on this result, pt may need to go back on systemic anticoagulation, possible referral to cards for PFO closure, etc. Neurology to f/u on TCD and further recommendations.  Weakness 11-11-2023 worsening since Sunday night due to leg weakness. Likely due to CVA.  11-12-2023 pt wants to go to SNF at discharge. Wife does not want CIR. CM aware.  11-13-2023 CM looking into SNF placement.  Essential tremor 11-11-2023 Resume deep brain stimulator treatment once this is charged. Pt states this has been less and less effective since he had a CVA in the area where his deep brain stimulator is attached.  11-12-2023 stable 11-13-2023 stable  CKD stage 3b, GFR 30-44 ml/min (HCC) - baseline SCr 2.0 11-11-2023 stable. Will repeat CMP in AM. 11-12-2023 Scr 1.99. stable.  History of pulmonary embolism 11-11-2023 Looks like he is no longer on long term AC at this point in time other than ASA + Plavix.  Overweight (BMI 25.0-29.9) BMI 29.58  Hyperlipidemia 11-11-23 pt is statin intolerant. On zetia and fenofibrate. Last LDL in 07-2023 was 70. Will resend LDL. Check lipoprotein A. 11-12-2023 LCL 59  Essential hypertension 11-11-2023 stable. BP stable. 11-12-2023 stable 11-13-2023 stable  OSA on CPAP 11-11-2023 Cont CPAP per home settings 11-12-2023 stable 11-13-2023 stable  Diabetes mellitus type 2 in obese (HCC) 11-11-2023 continue with lantus and SSI. CBG acceptable ranges. 11-12-2023 stable. Acceptable CBG ranges 11-13-2023 stable       DVT prophylaxis: enoxaparin (LOVENOX) injection 40 mg Start: 11/11/23 1000    Code Status: Full Code Family Communication: no family at bedside Disposition Plan: SNF Reason for continuing need for hospitalization: neurology wants TCD with bubble study and LE U/S  Objective: Vitals:   11/13/23 0944 11/13/23 0945 11/13/23 0950 11/13/23 1033  BP: 99/73 99/73 129/77 135/85  Pulse: 93 95 92 89  Resp: 18 20 18 17   Temp:      TempSrc:     Oral  SpO2: 94% 94% 97% 97%  Weight:      Height:        Intake/Output Summary (Last 24 hours) at 11/13/2023 1305 Last data filed at 11/13/2023 0917 Gross per 24 hour  Intake 100 ml  Output 1125 ml  Net -1025 ml   Filed Weights   11/10/23 2137  Weight: 110.2 kg    Examination:  Physical Exam Vitals and nursing note reviewed.  Constitutional:      General: He is not in acute distress.    Appearance: He is not toxic-appearing.  HENT:     Head: Normocephalic and atraumatic.     Nose: Nose normal.  Cardiovascular:     Rate and Rhythm: Normal rate and regular rhythm.  Pulmonary:     Effort: Pulmonary effort is normal.  Breath sounds: Normal breath sounds.  Abdominal:     General: Bowel sounds are normal.     Palpations: Abdomen is soft.  Musculoskeletal:     Right lower leg: No edema.     Left lower leg: No edema.  Skin:    General: Skin is warm and dry.     Capillary Refill: Capillary refill takes less than 2 seconds.  Neurological:     Mental Status: He is alert and oriented to person, place, and time.     Comments: Mild dysarthria     Data Reviewed: I have personally reviewed following labs and imaging studies  CBC: Recent Labs  Lab 11/09/23 1406  WBC 7.1  NEUTROABS 3.8  HGB 11.5*  HCT 35.9*  MCV 92.1  PLT 276   Basic Metabolic Panel: Recent Labs  Lab 11/09/23 1406 11/12/23 0705  NA 142 139  K 4.2 3.7  CL 109 109  CO2 23 24  GLUCOSE 90 169*  BUN 38* 37*  CREATININE 2.02* 1.99*  CALCIUM 9.3 8.6*   GFR: Estimated Creatinine Clearance: 44.3 mL/min (A) (by C-G formula based on SCr of 1.99 mg/dL (H)). Liver Function Tests: Recent Labs  Lab 11/09/23 1406 11/12/23 0705  AST 35 31  ALT 30 28  ALKPHOS 101 87  BILITOT 0.7 0.5  PROT 6.6 5.8*  ALBUMIN 3.3* 2.8*   BNP (last 3 results) Recent Labs    06/25/23 1000  BNP 27.5   HbA1C: Recent Labs    11/12/23 0705  HGBA1C 6.4*   CBG: Recent Labs  Lab 11/12/23 1155 11/12/23 1621  11/12/23 2231 11/13/23 0944 11/13/23 1205  GLUCAP 144* 129* 176* 166* 208*   Lipid Profile: Recent Labs    11/12/23 0705  CHOL 114  HDL 23*  LDLCALC 59  TRIG 161*  CHOLHDL 5.0    Radiology Studies: ECHO TEE Result Date: 11/13/2023    TRANSESOPHOGEAL ECHO REPORT   [name] Date of Exam: 11/13/2023 Medical Rec #:  [id number]       Height:       76.0 in Accession #:    [id number]      Weight:       243.0 lb Date of Birth:  [birth year]       BSA:          2.406 m Patient Age:    72 years        BP:           140/97 mmHg Patient Gender: M               HR:           92 bpm. Exam Location:  Inpatient Procedure: Transesophageal Echo, 3D Echo, Cardiac Doppler, Color Doppler and            Saline Contrast Bubble Study Indications:     Stroke  History:         Patient has prior history of Echocardiogram examinations, most                  recent 11/12/2023. Stroke and CKD, stage 3; Risk                  Factors:Hypertension, Sleep Apnea, Diabetes and Dyslipidemia.  Sonographer:     Lucendia Herrlich RCS Referring Phys:  [id number] Perlie Gold Diagnosing Phys: Charlton Haws MD PROCEDURE: After discussion of the risks and benefits of a TEE, an informed consent was obtained from  the patient. The transesophogeal probe was passed without difficulty through the esophogus of the patient. Imaged were obtained with the patient in a left lateral decubitus position. Sedation performed by different physician. The patient was monitored while under deep sedation. Anesthestetic sedation was provided intravenously by Anesthesiology: 124.39mg  of Propofol, 100mg  of Lidocaine. The patient's vital signs; including heart rate, blood pressure, and oxygen saturation; remained stable throughout the procedure. The patient developed no complications during the procedure.  IMPRESSIONS  1. Small PFO present with mobile atrial septum.  2. SAM with no significant LVOT gradient midl valvular AS . Left ventricular  ejection fraction, by estimation, is 60 to 65%. The left ventricle has normal function. The left ventricle has no regional wall motion abnormalities.  3. Right ventricular systolic function is normal. The right ventricular size is normal.  4. Left atrial size was mildly dilated. No left atrial/left atrial appendage thrombus was detected.  5. The mitral valve is abnormal. Trivial mitral valve regurgitation. No evidence of mitral stenosis.  6. The aortic valve is tricuspid. There is moderate calcification of the aortic valve. There is moderate thickening of the aortic valve. Aortic valve regurgitation is not visualized. Mild aortic valve stenosis.  7. The inferior vena cava is normal in size with greater than 50% respiratory variability, suggesting right atrial pressure of 3 mmHg.  8. Evidence of atrial level shunting detected by color flow Doppler. Agitated saline contrast bubble study was positive with shunting observed within 3-6 cardiac cycles suggestive of interatrial shunt. Conclusion(s)/Recommendation(s): Normal biventricular function without evidence of hemodynamically significant valvular heart disease. FINDINGS  Left Ventricle: SAM with no significant LVOT gradient midl valvular AS. Left ventricular ejection fraction, by estimation, is 60 to 65%. The left ventricle has normal function. The left ventricle has no regional wall motion abnormalities. The left ventricular internal cavity size was normal in size. There is no left ventricular hypertrophy. Right Ventricle: The right ventricular size is normal. No increase in right ventricular wall thickness. Right ventricular systolic function is normal. Left Atrium: Left atrial size was mildly dilated. No left atrial/left atrial appendage thrombus was detected. Right Atrium: Right atrial size was normal in size. Pericardium: There is no evidence of pericardial effusion. Mitral Valve: The mitral valve is abnormal. There is mild thickening of the mitral valve  leaflet(s). Trivial mitral valve regurgitation. No evidence of mitral valve stenosis. Tricuspid Valve: The tricuspid valve is normal in structure. Tricuspid valve regurgitation is trivial. No evidence of tricuspid stenosis. Aortic Valve: The aortic valve is tricuspid. There is moderate calcification of the aortic valve. There is moderate thickening of the aortic valve. Aortic valve regurgitation is not visualized. Mild aortic stenosis is present. Aortic valve mean gradient measures 7.0 mmHg. Aortic valve peak gradient measures 12.4 mmHg. Aortic valve area, by VTI measures 1.80 cm. Pulmonic Valve: The pulmonic valve was normal in structure. Pulmonic valve regurgitation is trivial. No evidence of pulmonic stenosis. Aorta: The aortic root is normal in size and structure. Venous: The inferior vena cava is normal in size with greater than 50% respiratory variability, suggesting right atrial pressure of 3 mmHg. IAS/Shunts: Evidence of atrial level shunting detected by color flow Doppler. Agitated saline contrast was given intravenously to evaluate for intracardiac shunting. Agitated saline contrast bubble study was positive with shunting observed within 3-6 cardiac cycles suggestive of interatrial shunt. Additional Comments: Small PFO present with mobile atrial septum. Spectral Doppler performed. LEFT VENTRICLE PLAX 2D LVOT diam:     2.20 cm LV SV:  52 LV SV Index:   22 LVOT Area:     3.80 cm  AORTIC VALVE AV Area (Vmax):    2.03 cm AV Area (Vmean):   2.01 cm AV Area (VTI):     1.80 cm AV Vmax:           176.00 cm/s AV Vmean:          119.000 cm/s AV VTI:            0.292 m AV Peak Grad:      12.4 mmHg AV Mean Grad:      7.0 mmHg LVOT Vmax:         94.10 cm/s LVOT Vmean:        62.800 cm/s LVOT VTI:          0.138 m LVOT/AV VTI ratio: 0.47  SHUNTS Systemic VTI:  0.14 m Systemic Diam: 2.20 cm Charlton Haws MD Electronically signed by Charlton Haws MD Signature Date/Time: 11/13/2023/10:25:14 AM    Final     ECHOCARDIOGRAM LIMITED Result Date: 11/12/2023    ECHOCARDIOGRAM LIMITED REPORT   [name] Date of Exam: 11/12/2023 Medical Rec #:  [id number]       Height:       76.0 in Accession #:    [id number]      Weight:       243.0 lb Date of Birth:  June 13, 1951       BSA:          2.406 m Patient Age:    72 years        BP:           125/87 mmHg Patient Gender: M               HR:           92 bpm. Exam Location:  Inpatient Procedure: Limited Echo, Color Doppler, Cardiac Doppler and Intracardiac            Opacification Agent Indications:     Stroke I63.9  History:         Patient has prior history of Echocardiogram examinations, most                  recent 06/26/2023. Stroke.  Sonographer:     Harriette Bouillon RDCS Referring Phys:  [phone] Aliz Meritt Diagnosing Phys: [place] IMPRESSIONS  1. The endocardial borders are not well visualized despite contrast. LV appears grossly mildly reduced. Agree with TEE.  2. Right ventricular systolic function was not well visualized. The right ventricular size is not well visualized.  3. A small pericardial effusion is present.  4. The aortic valve was not well visualized. FINDINGS  Left Ventricle: The endocardial borders are not well visualized despite contrast. LV appears grossly mildly reduced. Agree with TEE. Definity contrast agent was given IV to delineate the left ventricular endocardial borders. Right Ventricle: The right ventricular size is not well visualized. Right ventricular systolic function was not well visualized. Pericardium: A small pericardial effusion is present. Aortic Valve: The aortic valve was not well visualized. IVC IVC diam: 2.20 cm Carolan Clines Electronically signed by Carolan Clines Signature Date/Time: 11/12/2023/9:53:50 AM    Final (Updated)     Scheduled Meds:  amLODipine  2.5 mg Oral Daily   aspirin EC  81 mg Oral Daily   atorvastatin  40 mg Oral q1800   DULoxetine  60 mg Oral BID   enoxaparin (LOVENOX) injection  40 mg  Subcutaneous Q24H   ezetimibe  10 mg Oral Daily   feeding supplement  237 mL Oral BID BM   finasteride  5 mg Oral Daily   insulin aspart  0-15 Units Subcutaneous TID WC   insulin aspart  0-5 Units Subcutaneous QHS   insulin glargine-yfgn  35 Units Subcutaneous Daily   losartan  100 mg Oral Daily   metoprolol succinate  50 mg Oral Daily   mirtazapine  7.5 mg Oral QHS   tamsulosin  0.4 mg Oral QPC supper   ticagrelor  90 mg Oral BID   Continuous Infusions:   LOS: 2 days   Time spent: 40 minutes  Carollee Herter, DO  [facility]  11/13/2023, 1:05 PM

## 2023-11-13 NOTE — Progress Notes (Signed)
STROKE TEAM PROGRESS NOTE   BRIEF HPI [name] is a 72 y.o. male with history of DM2, depression, HTN, PE not on AC, prior stroke with baseline L sided weakness, who presents with worsening R leg weakness in addition to his chronic L Leg weakness. He reports getting up in the morning yesterday and was wobbly, off balance and felt like his R leg was going to give away. However, feels weakness has been going on for over a week. He also endorses slurred speech. Reports having stroke back in July of this year and that caused his left side to be weak but this time, its the right side that is weaker. He had MRI Brain w.o contrast which demonstrated posterior L frontal stroke.   NIH on Admission 4  INTERIM HISTORY/SUBJECTIVE  Neurologically and hemodynamically stable. Does have a deep brain stimulator for a tremor on the left hand.  TEE shows small PFO and mild left atrial dilatation.  Moderate calcification of aortic valve with mild aortic stenosis. OBJECTIVERI scan shows a 1.6 cm left frontal cortical/subcortical infarct.  CBC    Component Value Date/Time   WBC 7.1 11/09/2023 1406   RBC 3.90 (L) 11/09/2023 1406   HGB 11.5 (L) 11/09/2023 1406   HGB 12.2 (L) 06/25/2023 1000   HCT 35.9 (L) 11/09/2023 1406   HCT 37.8 06/25/2023 1000   PLT 276 11/09/2023 1406   PLT 278 06/25/2023 1000   MCV 92.1 11/09/2023 1406   MCV 90 06/25/2023 1000   MCH 29.5 11/09/2023 1406   MCHC 32.0 11/09/2023 1406   RDW 13.6 11/09/2023 1406   RDW 14.2 06/25/2023 1000   LYMPHSABS 2.5 11/09/2023 1406   MONOABS 0.6 11/09/2023 1406   EOSABS 0.2 11/09/2023 1406   BASOSABS 0.1 11/09/2023 1406    BMET    Component Value Date/Time   NA 139 11/12/2023 0705   NA 141 06/25/2023 1000   K 3.7 11/12/2023 0705   CL 109 11/12/2023 0705   CO2 24 11/12/2023 0705   GLUCOSE 169 (H) 11/12/2023 0705   BUN 37 (H) 11/12/2023 0705   BUN 28 (H) 06/25/2023 1000   CREATININE 1.99 (H) 11/12/2023 0705   CALCIUM 8.6 (L)  11/12/2023 0705   EGFR 34 (L) 06/25/2023 1000   GFRNONAA 35 (L) 11/12/2023 0705    IMAGING past 24 hours ECHO TEE Result Date: 11/13/2023    TRANSESOPHOGEAL ECHO REPORT   [name] Date of Exam: 11/13/2023 Medical Rec #:  [id number]       Height:       76.0 in Accession #:    [id number]      Weight:       243.0 lb Date of Birth:  1951/09/11       BSA:          2.406 m Patient Age:    72 years        BP:           140/97 mmHg Patient Gender: M               HR:           92 bpm. Exam Location:  Inpatient Procedure: Transesophageal Echo, 3D Echo, Cardiac Doppler, Color Doppler and            Saline Contrast Bubble Study Indications:     Stroke  History:         Patient has prior history of Echocardiogram examinations, most  recent 11/12/2023. Stroke and CKD, stage 3; Risk                  Factors:Hypertension, Sleep Apnea, Diabetes and Dyslipidemia.  Sonographer:     Lucendia Herrlich RCS Referring Phys:  [id number] Perlie Gold Diagnosing Phys: Charlton Haws MD PROCEDURE: After discussion of the risks and benefits of a TEE, an informed consent was obtained from the patient. The transesophogeal probe was passed without difficulty through the esophogus of the patient. Imaged were obtained with the patient in a left lateral decubitus position. Sedation performed by different physician. The patient was monitored while under deep sedation. Anesthestetic sedation was provided intravenously by Anesthesiology: 124.39mg  of Propofol, 100mg  of Lidocaine. The patient's vital signs; including heart rate, blood pressure, and oxygen saturation; remained stable throughout the procedure. The patient developed no complications during the procedure.  IMPRESSIONS  1. Small PFO present with mobile atrial septum.  2. SAM with no significant LVOT gradient midl valvular AS . Left ventricular ejection fraction, by estimation, is 60 to 65%. The left ventricle has normal function. The left ventricle has  no regional wall motion abnormalities.  3. Right ventricular systolic function is normal. The right ventricular size is normal.  4. Left atrial size was mildly dilated. No left atrial/left atrial appendage thrombus was detected.  5. The mitral valve is abnormal. Trivial mitral valve regurgitation. No evidence of mitral stenosis.  6. The aortic valve is tricuspid. There is moderate calcification of the aortic valve. There is moderate thickening of the aortic valve. Aortic valve regurgitation is not visualized. Mild aortic valve stenosis.  7. The inferior vena cava is normal in size with greater than 50% respiratory variability, suggesting right atrial pressure of 3 mmHg.  8. Evidence of atrial level shunting detected by color flow Doppler. Agitated saline contrast bubble study was positive with shunting observed within 3-6 cardiac cycles suggestive of interatrial shunt. Conclusion(s)/Recommendation(s): Normal biventricular function without evidence of hemodynamically significant valvular heart disease. FINDINGS  Left Ventricle: SAM with no significant LVOT gradient midl valvular AS. Left ventricular ejection fraction, by estimation, is 60 to 65%. The left ventricle has normal function. The left ventricle has no regional wall motion abnormalities. The left ventricular internal cavity size was normal in size. There is no left ventricular hypertrophy. Right Ventricle: The right ventricular size is normal. No increase in right ventricular wall thickness. Right ventricular systolic function is normal. Left Atrium: Left atrial size was mildly dilated. No left atrial/left atrial appendage thrombus was detected. Right Atrium: Right atrial size was normal in size. Pericardium: There is no evidence of pericardial effusion. Mitral Valve: The mitral valve is abnormal. There is mild thickening of the mitral valve leaflet(s). Trivial mitral valve regurgitation. No evidence of mitral valve stenosis. Tricuspid Valve: The tricuspid  valve is normal in structure. Tricuspid valve regurgitation is trivial. No evidence of tricuspid stenosis. Aortic Valve: The aortic valve is tricuspid. There is moderate calcification of the aortic valve. There is moderate thickening of the aortic valve. Aortic valve regurgitation is not visualized. Mild aortic stenosis is present. Aortic valve mean gradient measures 7.0 mmHg. Aortic valve peak gradient measures 12.4 mmHg. Aortic valve area, by VTI measures 1.80 cm. Pulmonic Valve: The pulmonic valve was normal in structure. Pulmonic valve regurgitation is trivial. No evidence of pulmonic stenosis. Aorta: The aortic root is normal in size and structure. Venous: The inferior vena cava is normal in size with greater than 50% respiratory variability, suggesting right atrial pressure of 3 mmHg.  IAS/Shunts: Evidence of atrial level shunting detected by color flow Doppler. Agitated saline contrast was given intravenously to evaluate for intracardiac shunting. Agitated saline contrast bubble study was positive with shunting observed within 3-6 cardiac cycles suggestive of interatrial shunt. Additional Comments: Small PFO present with mobile atrial septum. Spectral Doppler performed. LEFT VENTRICLE PLAX 2D LVOT diam:     2.20 cm LV SV:         52 LV SV Index:   22 LVOT Area:     3.80 cm  AORTIC VALVE AV Area (Vmax):    2.03 cm AV Area (Vmean):   2.01 cm AV Area (VTI):     1.80 cm AV Vmax:           176.00 cm/s AV Vmean:          119.000 cm/s AV VTI:            0.292 m AV Peak Grad:      12.4 mmHg AV Mean Grad:      7.0 mmHg LVOT Vmax:         94.10 cm/s LVOT Vmean:        62.800 cm/s LVOT VTI:          0.138 m LVOT/AV VTI ratio: 0.47  SHUNTS Systemic VTI:  0.14 m Systemic Diam: 2.20 cm Charlton Haws MD Electronically signed by Charlton Haws MD Signature Date/Time: 11/13/2023/10:25:14 AM    Final     Vitals:   11/13/23 9629 11/13/23 0945 11/13/23 0950 11/13/23 1033  BP: 99/73 99/73 129/77 135/85  Pulse: 93 95 92 89   Resp: 18 20 18 17   Temp:      TempSrc:    Oral  SpO2: 94% 94% 97% 97%  Weight:      Height:         PHYSICAL EXAM General:  Alert, well-nourished, well-developed patient in no acute distress Psych:  Mood and affect appropriate for situation CV: Regular rate and rhythm on monitor Respiratory:  Regular, unlabored respirations on room air GI: Abdomen soft and nontender   NEURO:  Mental Status: AA&Ox3, patient is able to give clear and coherent history Speech/Language: speech is without dysarthria or aphasia.  Naming, repetition, fluency, and comprehension intact.  Cranial Nerves:  II: PERRL. Visual fields full.  III, IV, VI: EOMI. Eyelids elevate symmetrically.  V: Sensation is intact to light touch and symmetrical to face.  VII: Face is asymmetrical with right lower facial weakness on smiling VIII: hearing intact to voice. IX, X: Palate elevates symmetrically. Phonation is normal.  BM:[id number] shrug 5/5. XII: tongue is midline without fasciculations. Motor:  RUE 4/5 - reports pain and fx at the elbow, hand grasp weak in comparison to the left hand RUE leg with drift to the bed 3/5 LLE 4/5 Tone: is normal and bulk is normal Sensation- Intact to light touch bilaterally. Extinction absent to light touch to DSS.   Coordination: FTN intact bilaterally, baseline tremor on the left  Gait- deferred  Most Recent NIH 2  ASSESSMENT/PLAN  Acute Ischemic Infarct:  Subacute left frontal infarct Etiology:  vessel disease vs embolic   Code Stroke CT head No acute abnormality.  CTA head & neck Pending  MRI  New T2 hyperintense lesion in the posterior left frontal lobe, favored to represent a late subacute infarct. 2D Echo The endocardial borders are not well visualized despite contrast.  A small pericardial effusion is present.  He had a prior echo in August 2024 showed an EF  of 60 to 65% with severe left ventricular hypertrophy.  Left atrial size was normal. LDL 70 HgbA1c 6.4 VTE  prophylaxis - Lovenox aspirin 81 mg daily and clopidogrel 75 mg daily prior to admission, continue aspirin and Plavix as patient will not be able to tolerate Brilinta due to shortness of breath Therapy recommendations:  SNF Disposition:  SNF Hx of Stroke/TIA Dates back to 2013 -- most recent 04/17/2021 with lasting foot drop and mild dysarthria at baseline, started on eliquis 5 BID at that time. In 07/2023 eliquis switched to asa and plavix which he remains on now.   Hypertension CAD Home meds:  Norvasc 10 (taking 2.5 at night), losartan 100, metoprolol tartrate 50 daily Stable Blood Pressure Goal: BP less than 220/110   Hyperlipidemia Home meds: Atorvastatin 40 mg, ezitimibe 10, fenofibrate 54 mg resumed in hospital LDL 70, goal < 70 Continue statin at discharge  Diabetes type II Controlled Home meds:  insulin glargine 70u BID, metformin HgbA1c 6.4, goal < 7.0 CBGs SSI Recommend close follow-up with PCP for better DM control  Other Stroke Risk Factors ETOH use, alcohol level <10, advised to drink no more than 2 drink(s) a day Family hx stroke  Obstructive sleep apnea  Uses CPAP Follows with [facility]   Other Active Problems BPH flomax  Hospital day # 2    Patient presented with 5-day history of right leg weakness and MRI shows subacute left frontal large subcortical infarct etiology possibly cryptogenic versus intracranial atherosclerosis.  Patient is already on dual antiplatelet therapy.  Recommend continue aspirin and Plavix as patient will not be able to tolerate Brilinta due to being short of breath at baseline.  Maintain aggressive risk factor modification.   TEE shows a small PFO patient probably not a candidate for endovascular closure given his advanced age and low ROPE . check TCD bubble study.  Patient counseled to be compliant with medications and to use his CPAP every night.  Continue follow-up with his neurologist Dr. Arbutus Leas as an outpatient.  Greater than 50% time during  this 35-minute visit was spent on counseling and coordination of care and discussion patient and care team and answering questions about his stroke and leg weakness.  Discussed with Dr. Nilda Simmer, MD Medical Director [facility] Pager: [phone] 11/13/2023 1:42 PM  To contact Stroke Continuity provider, please refer to [email or web address]. After hours, contact General Neurology

## 2023-11-13 NOTE — Progress Notes (Signed)
Echocardiogram Echocardiogram Transesophageal has been performed.  Joshua [name] 11/13/2023, 9:21 AM

## 2023-11-13 NOTE — Progress Notes (Signed)
   11/13/23 0000  BiPAP/CPAP/SIPAP  BiPAP/CPAP/SIPAP Pt Type Adult  BiPAP/CPAP/SIPAP Resmed  Mask Type Full face mask  Mask Size Large  FiO2 (%) 21 %  Auto Titrate Yes (11.6)

## 2023-11-14 ENCOUNTER — Inpatient Hospital Stay (HOSPITAL_COMMUNITY): Payer: No Typology Code available for payment source

## 2023-11-14 DIAGNOSIS — I639 Cerebral infarction, unspecified: Secondary | ICD-10-CM

## 2023-11-14 DIAGNOSIS — R531 Weakness: Secondary | ICD-10-CM | POA: Diagnosis not present

## 2023-11-14 DIAGNOSIS — Q2112 Patent foramen ovale: Secondary | ICD-10-CM | POA: Diagnosis not present

## 2023-11-14 DIAGNOSIS — G250 Essential tremor: Secondary | ICD-10-CM | POA: Diagnosis not present

## 2023-11-14 LAB — GLUCOSE, CAPILLARY
Glucose-Capillary: 172 mg/dL — ABNORMAL HIGH (ref 70–99)
Glucose-Capillary: 200 mg/dL — ABNORMAL HIGH (ref 70–99)
Glucose-Capillary: 196 mg/dL — ABNORMAL HIGH (ref 70–99)
Glucose-Capillary: 178 mg/dL — ABNORMAL HIGH (ref 70–99)

## 2023-11-14 NOTE — Progress Notes (Addendum)
STROKE TEAM PROGRESS NOTE   BRIEF HPI Mr. DEMORIO [name] is a 72 y.o. male with history of DM2, depression, HTN, PE not on AC, prior stroke with baseline L sided weakness, who presents with worsening R leg weakness in addition to his chronic L Leg weakness. He reports getting up in the morning yesterday and was wobbly, off balance and felt like his R leg was going to give away. However, feels weakness has been going on for over a week. He also endorses slurred speech. Reports having stroke back in July of this year and that caused his left side to be weak but this time, its the right side that is weaker. He had MRI Brain w.o contrast which demonstrated posterior L frontal stroke.   NIH on Admission 4  INTERIM HISTORY/SUBJECTIVE  No changes. TCD bubble and le venous dopplers pending.MRRI scan shows a 1.6 cm left frontal cortical/subcortical infarct. OBJECTIVE   CBC    Component Value Date/Time   WBC 7.1 11/09/2023 1406   RBC 3.90 (L) 11/09/2023 1406   HGB 11.5 (L) 11/09/2023 1406   HGB 12.2 (L) 06/25/2023 1000   HCT 35.9 (L) 11/09/2023 1406   HCT 37.8 06/25/2023 1000   PLT 276 11/09/2023 1406   PLT 278 06/25/2023 1000   MCV 92.1 11/09/2023 1406   MCV 90 06/25/2023 1000   MCH 29.5 11/09/2023 1406   MCHC 32.0 11/09/2023 1406   RDW 13.6 11/09/2023 1406   RDW 14.2 06/25/2023 1000   LYMPHSABS 2.5 11/09/2023 1406   MONOABS 0.6 11/09/2023 1406   EOSABS 0.2 11/09/2023 1406   BASOSABS 0.1 11/09/2023 1406    BMET    Component Value Date/Time   NA 139 11/12/2023 0705   NA 141 06/25/2023 1000   K 3.7 11/12/2023 0705   CL 109 11/12/2023 0705   CO2 24 11/12/2023 0705   GLUCOSE 169 (H) 11/12/2023 0705   BUN 37 (H) 11/12/2023 0705   BUN 28 (H) 06/25/2023 1000   CREATININE 1.99 (H) 11/12/2023 0705   CALCIUM 8.6 (L) 11/12/2023 0705   EGFR 34 (L) 06/25/2023 1000   GFRNONAA 35 (L) 11/12/2023 0705    IMAGING past 24 hours No results found.   Vitals:   11/13/23 2226 11/13/23 2355  11/14/23 0334 11/14/23 0835  BP: 127/88  (!) 147/88 121/83  Pulse: 86 84 86 88  Resp: 18  18 14   Temp: 98.8 F (37.1 C)  98.4 F (36.9 C) 98.5 F (36.9 C)  TempSrc:    Oral  SpO2: 94% 96% 96% 95%  Weight:      Height:         PHYSICAL EXAM General:  Alert, well-nourished, well-developed patient in no acute distress Psych:  Mood and affect appropriate for situation CV: Regular rate and rhythm on monitor Respiratory:  Regular, unlabored respirations on room air GI: Abdomen soft and nontender   NEURO:  Mental Status: AA&Ox3, patient is able to give clear and coherent history Speech/Language: speech is without dysarthria or aphasia.  Naming, repetition, fluency, and comprehension intact.  Cranial Nerves:  II: PERRL. Visual fields full.  III, IV, VI: EOMI. Eyelids elevate symmetrically.  V: Sensation is intact to light touch and symmetrical to face.  VII: Face is asymmetrical with right lower facial weakness on smiling VIII: hearing intact to voice. IX, X: Palate elevates symmetrically. Phonation is normal.  ZO:[id number] shrug 5/5. XII: tongue is midline without fasciculations. Motor:  RUE 4/5 - reports pain and fx at the elbow,  hand grasp weak in comparison to the left hand RUE leg with drift to the bed 3/5 LLE 4/5 Tone: is normal and bulk is normal Sensation- Intact to light touch bilaterally. Extinction absent to light touch to DSS.   Coordination: FTN intact bilaterally, baseline tremor on the left  Gait- deferred  Most Recent NIH 2  ASSESSMENT/PLAN  Acute Ischemic Infarct:  Subacute left frontal infarct Etiology:  vessel disease vs embolic   Code Stroke CT head No acute abnormality.  CTA head & neck Pending  MRI  New T2 hyperintense lesion in the posterior left frontal lobe, favored to represent a late subacute infarct. 2D Echo The endocardial borders are not well visualized despite contrast.  A small pericardial effusion is present.  He had a prior echo in August  2024 showed an EF of 60 to 65% with severe left ventricular hypertrophy.  Left atrial size was normal. LDL 70 HgbA1c 6.4 VTE prophylaxis - Lovenox aspirin 81 mg daily and clopidogrel 75 mg daily prior to admission, continue aspirin and Plavix as patient will not be able to tolerate Brilinta due to shortness of breath Therapy recommendations:  SNF Disposition:  SNF Hx of Stroke/TIA Dates back to 2013 -- most recent 04/17/2021 with lasting foot drop and mild dysarthria at baseline, started on eliquis 5 BID at that time. In 07/2023 eliquis switched to asa and plavix which he remains on now.   Hypertension CAD Home meds:  Norvasc 10 (taking 2.5 at night), losartan 100, metoprolol tartrate 50 daily Stable Blood Pressure Goal: BP less than 220/110   Hyperlipidemia Home meds: Atorvastatin 40 mg, ezitimibe 10, fenofibrate 54 mg resumed in hospital LDL 70, goal < 70 Continue statin at discharge  Diabetes type II Controlled Home meds:  insulin glargine 70u BID, metformin HgbA1c 6.4, goal < 7.0 CBGs SSI Recommend close follow-up with PCP for better DM control  Other Stroke Risk Factors ETOH use, alcohol level <10, advised to drink no more than 2 drink(s) a day Family hx stroke  Obstructive sleep apnea  Uses CPAP Follows with [facility]   Other Active Problems BPH flomax  Hospital day # 3  Recommend continue aspirin and Plavix as patient will not be able to tolerate Brilinta due to being short of breath at baseline.  Maintain aggressive risk factor modification.   TEE shows a small PFO patient probably not a candidate for endovascular closure given his advanced age and low ROPE .  TCD bubble study shows a very small clinically insignificant PFO.  Patient is not a candidate for PFO closure.  Continue conservative medical therapy..  Patient counseled to be compliant with medications and to use his CPAP every night.   Stroke team will sign off.  Kindly call for questions.  Follow-up as an outpatient  with hisr neurologist    Delia Heady, MD Medical Director [facility] Pager: [phone] 11/14/2023 12:07 PM  To contact Stroke Continuity provider, please refer to [email or web address]. After hours, contact General Neurology

## 2023-11-14 NOTE — Progress Notes (Signed)
[facility] Liaison Note:  Notified by [facility] manager of patient/family request for [facility] services at home after discharge.   Please call with any hospice or outpatient palliative care related questions.   Thank you for the opportunity to participate in this patient's care.   Glenna Fellows, BSN, RN, OCN [employer] [phone]

## 2023-11-14 NOTE — Progress Notes (Signed)
PROGRESS NOTE    Joshua [name]  OZD:[id number] DOB: 1951-06-20 DOA: 11/09/2023 PCP: Creola Corn, MD  Subjective: Pt seen and examined.  Transcranial doppler with bubble study order as requested by neurology. Unable to get this performed yesterday.  Neurology also requesting LE U/S to r/o DVT.  Discussed with pt that he cannot refuse PT/OT if he has any hopes of getting into SNF for rehab.  Pt states he understands and will make every effort to participate.  Explained to him that it's not about his performance with therapy.  It's about his willingnes to attempt and try that is important.     Hospital Course: HPI: Joshua [name] is a 72 y.o. male with medical history significant of prior CVA, HTN, DM, CKD, essential tremor with deep brain stimulator, prior PE no longer on chronic AC it appears.   Pt in to ED on 12/6 with onset of BLE numbness and weakness.   The patient states when he woke up 12/15 morning he was walking to the kitchen when the backs of his legs felt numb bilaterally.   He states then around 4 PM his legs started to feel weak, worse on the right than the left. He states that he was going to the bathroom and nearly fell but was able to get himself out of the bathroom and into the bed before falling. He states that when he woke up 12/16 AM he tried to get out of bed and slipped from sitting on the edge of the bed to the ground.   He denies hitting his head. He states that he is legs felt too weak and numb to support him. He denies any associated headache or back pain. He denies any dysuria or hematuria or urinary incontinence or retention. He denies any saddle anesthesia. He states that prior to yesterday he was feeling like his normal self without any recent fevers, nausea, vomiting or diarrhea.   ED work up initially was negative AM of 12/17.  Plan was for patient to get MRI of head, L spine, and T spine given BLE weakness during day on 12/17, had to be done during  day due to brain stimulator needing to be re-programed to MRI compatible mode.   Apparently however, when they tried to get MRI: "Per MRI, unable to complete scan due to internal device battery being dead. Pt returned to room for device to charge and MRI will now have to be completed at 6am tomorrow morning."  Significant Events: Admitted 11/09/2023 for generalized weakness   Significant Labs: Admission WBC 7.1 HgB 11.5, BUN 38, scr 2.02  Significant Imaging Studies: CT head shows No acute intracranial process  MRI brain shows subacute CVA of posterior left frontal lobe   Antibiotic Therapy: Anti-infectives (From admission, onward)    None       Procedures:   Consultants:     Assessment and Plan: * CVA (cerebral vascular accident) (HCC) 11-11-2023 unfortunately, pt's MRI brain showed subacute CVA of left posterior frontal lobe. Pt was taking ASA/Plavix when he had his CVA this time.  In September 2024, pt had another while he was on Eliquis and Asa 81 mg daily.  Pt intolerant to statins. Already on zetia and fenofibrate. His LDL in September was 70. No history of afib. But he had a CVA while on Eliquis in September 2024.  Pt is in a very tough situation. He continues to have recurrent CVA despite DAPT. He had a CVA in 07-2023 while on  systemic anticoagulation and ASA 81 mg daily.  Will check limited echo to make sure he doesn't have thrombus, but I doubt it.  Initially I thought that CIR may be a way for him to get rehab. Pt is a large man. About 6 feet 4 inches. Wife states she cannot physically lift him at home.  OT has messaged me that pt wants to go to SNF now.  11-12-2023 neurology consulted. Awaiting further recs  11-13-2023 TEE showed PFO. Neurology wants transcranial dopplers with bubble study and LE U/S to r/o DVT.  Pt did have a subclavian vein DVT around 03-2021. It's possible he could have thrown a thrombus thru his PFO that caused his CVA.  The TCD with bubble should  answer the question if the PFO is significant enough.  11-14-2023 awaiting TCD with bubble study and LE U/S as requested by neurology.  PFO (patent foramen ovale) 11-13-2023 I have reviewed all of pt's prior surface echos and 1 TEE in 03-2021. None report any intra-atrial shunts. Today's TEE shows small PFO. Neurology wants transcranial dopplers with bubble study.  Depending on this result, pt may need to go back on systemic anticoagulation, possible referral to cards for PFO closure, etc. Neurology to f/u on TCD and further recommendations.  11-14-2023 awaiting for neurology to perform TCD with bubble study.  Weakness 11-11-2023 worsening since Sunday night due to leg weakness. Likely due to CVA.  11-12-2023 pt wants to go to SNF at discharge. Wife does not want CIR. CM aware.  11-13-2023 CM looking into SNF placement. 11-14-2023 continue to search for SNF placement. Pt states he will work with PT/OT when asked.  Essential tremor 11-11-2023 Resume deep brain stimulator treatment once this is charged. Pt states this has been less and less effective since he had a CVA in the area where his deep brain stimulator is attached.  11-12-2023, 11-13-2023, 11-14-2023 stable  CKD stage 3b, GFR 30-44 ml/min (HCC) - baseline SCr 2.0 11-11-2023 stable. Will repeat CMP in AM. 11-12-2023 Scr 1.99. stable.  History of pulmonary embolism 11-11-2023 Looks like he is no longer on long term AC at this point in time other than ASA + Plavix.  Overweight (BMI 25.0-29.9) BMI 29.58  Hyperlipidemia 11-11-23 pt is statin intolerant. On zetia and fenofibrate. Last LDL in 07-2023 was 70. Will resend LDL. Check lipoprotein A. 11-12-2023 LCL 59  Essential hypertension 11-11-2023 stable. BP stable. 11-12-2023, 11-13-2023, 11-14-2023 stable. Remains on Toprol-xl 50 mg qday, cozaar 100 mg qday., norvasc 2.5 mg qday  OSA on CPAP 11-11-2023 Cont CPAP per home settings 11-12-2023, 11-13-2023, 11-14-2023  stable  Diabetes mellitus type 2 in obese (HCC) 11-11-2023 continue with lantus and SSI. CBG acceptable ranges. 11-12-2023, 11-13-2023, 11-14-2023 stable. Acceptable CBG ranges   DVT prophylaxis: enoxaparin (LOVENOX) injection 40 mg Start: 11/11/23 1000    Code Status: Full Code Family Communication: no family at bedside Disposition Plan: SNF Reason for continuing need for hospitalization: awaiting neurology to perform TCD. Pt is medically stable. He could get this test as outpatient.  Objective: Vitals:   11/13/23 2226 11/13/23 2355 11/14/23 0334 11/14/23 0835  BP: 127/88  (!) 147/88 121/83  Pulse: 86 84 86 88  Resp: 18  18 14   Temp: 98.8 F (37.1 C)  98.4 F (36.9 C) 98.5 F (36.9 C)  TempSrc:    Oral  SpO2: 94% 96% 96% 95%  Weight:      Height:        Intake/Output Summary (Last 24 hours) at  11/14/2023 1055 Last data filed at 11/14/2023 0335 Gross per 24 hour  Intake --  Output 650 ml  Net -650 ml   Filed Weights   11/10/23 2137  Weight: 110.2 kg    Examination:  Physical Exam Vitals and nursing note reviewed.  Constitutional:      General: He is not in acute distress.    Appearance: He is not toxic-appearing or diaphoretic.  HENT:     Head: Normocephalic and atraumatic.     Nose: Nose normal.  Cardiovascular:     Rate and Rhythm: Normal rate and regular rhythm.  Pulmonary:     Effort: Pulmonary effort is normal.     Breath sounds: Normal breath sounds.  Abdominal:     General: Bowel sounds are normal.     Palpations: Abdomen is soft.  Musculoskeletal:     Right lower leg: No edema.     Left lower leg: No edema.  Skin:    General: Skin is warm and dry.  Neurological:     Mental Status: He is alert and oriented to person, place, and time.     Comments: Stable dysarthria     Data Reviewed: I have personally reviewed following labs and imaging studies  CBC: Recent Labs  Lab 11/09/23 1406  WBC 7.1  NEUTROABS 3.8  HGB 11.5*  HCT 35.9*   MCV 92.1  PLT 276   Basic Metabolic Panel: Recent Labs  Lab 11/09/23 1406 11/12/23 0705  NA 142 139  K 4.2 3.7  CL 109 109  CO2 23 24  GLUCOSE 90 169*  BUN 38* 37*  CREATININE 2.02* 1.99*  CALCIUM 9.3 8.6*   GFR: Estimated Creatinine Clearance: 44.3 mL/min (A) (by C-G formula based on SCr of 1.99 mg/dL (H)). Liver Function Tests: Recent Labs  Lab 11/09/23 1406 11/12/23 0705  AST 35 31  ALT 30 28  ALKPHOS 101 87  BILITOT 0.7 0.5  PROT 6.6 5.8*  ALBUMIN 3.3* 2.8*   BNP (last 3 results) Recent Labs    06/25/23 1000  BNP 27.5   HbA1C: Recent Labs    11/12/23 0705  HGBA1C 6.4*   CBG: Recent Labs  Lab 11/13/23 0944 11/13/23 1205 11/13/23 1711 11/13/23 2058 11/14/23 0832  GLUCAP 166* 208* 129* 156* 172*   Lipid Profile: Recent Labs    11/12/23 0705  CHOL 114  HDL 23*  LDLCALC 59  TRIG 161*  CHOLHDL 5.0    Radiology Studies: ECHO TEE Result Date: 11/13/2023    TRANSESOPHOGEAL ECHO REPORT   [name] Date of Exam: 11/13/2023 Medical Rec #:  [id number]       Height:       76.0 in Accession #:    [id number]      Weight:       243.0 lb Date of Birth:  [birth year]       BSA:          2.406 m Patient Age:    72 years        BP:           140/97 mmHg Patient Gender: M               HR:           92 bpm. Exam Location:  Inpatient Procedure: Transesophageal Echo, 3D Echo, Cardiac Doppler, Color Doppler and            Saline Contrast Bubble Study Indications:  Stroke  History:         Patient has prior history of Echocardiogram examinations, most                  recent 11/12/2023. Stroke and CKD, stage 3; Risk                  Factors:Hypertension, Sleep Apnea, Diabetes and Dyslipidemia.  Sonographer:     Lucendia Herrlich RCS Referring Phys:  [id number] Perlie Gold Diagnosing Phys: Charlton Haws MD PROCEDURE: After discussion of the risks and benefits of a TEE, an informed consent was obtained from the patient. The transesophogeal probe was  passed without difficulty through the esophogus of the patient. Imaged were obtained with the patient in a left lateral decubitus position. Sedation performed by different physician. The patient was monitored while under deep sedation. Anesthestetic sedation was provided intravenously by Anesthesiology: 124.39mg  of Propofol, 100mg  of Lidocaine. The patient's vital signs; including heart rate, blood pressure, and oxygen saturation; remained stable throughout the procedure. The patient developed no complications during the procedure.  IMPRESSIONS  1. Small PFO present with mobile atrial septum.  2. SAM with no significant LVOT gradient midl valvular AS . Left ventricular ejection fraction, by estimation, is 60 to 65%. The left ventricle has normal function. The left ventricle has no regional wall motion abnormalities.  3. Right ventricular systolic function is normal. The right ventricular size is normal.  4. Left atrial size was mildly dilated. No left atrial/left atrial appendage thrombus was detected.  5. The mitral valve is abnormal. Trivial mitral valve regurgitation. No evidence of mitral stenosis.  6. The aortic valve is tricuspid. There is moderate calcification of the aortic valve. There is moderate thickening of the aortic valve. Aortic valve regurgitation is not visualized. Mild aortic valve stenosis.  7. The inferior vena cava is normal in size with greater than 50% respiratory variability, suggesting right atrial pressure of 3 mmHg.  8. Evidence of atrial level shunting detected by color flow Doppler. Agitated saline contrast bubble study was positive with shunting observed within 3-6 cardiac cycles suggestive of interatrial shunt. Conclusion(s)/Recommendation(s): Normal biventricular function without evidence of hemodynamically significant valvular heart disease. FINDINGS  Left Ventricle: SAM with no significant LVOT gradient midl valvular AS. Left ventricular ejection fraction, by estimation, is 60 to  65%. The left ventricle has normal function. The left ventricle has no regional wall motion abnormalities. The left ventricular internal cavity size was normal in size. There is no left ventricular hypertrophy. Right Ventricle: The right ventricular size is normal. No increase in right ventricular wall thickness. Right ventricular systolic function is normal. Left Atrium: Left atrial size was mildly dilated. No left atrial/left atrial appendage thrombus was detected. Right Atrium: Right atrial size was normal in size. Pericardium: There is no evidence of pericardial effusion. Mitral Valve: The mitral valve is abnormal. There is mild thickening of the mitral valve leaflet(s). Trivial mitral valve regurgitation. No evidence of mitral valve stenosis. Tricuspid Valve: The tricuspid valve is normal in structure. Tricuspid valve regurgitation is trivial. No evidence of tricuspid stenosis. Aortic Valve: The aortic valve is tricuspid. There is moderate calcification of the aortic valve. There is moderate thickening of the aortic valve. Aortic valve regurgitation is not visualized. Mild aortic stenosis is present. Aortic valve mean gradient measures 7.0 mmHg. Aortic valve peak gradient measures 12.4 mmHg. Aortic valve area, by VTI measures 1.80 cm. Pulmonic Valve: The pulmonic valve was normal in structure. Pulmonic valve regurgitation is trivial. No  evidence of pulmonic stenosis. Aorta: The aortic root is normal in size and structure. Venous: The inferior vena cava is normal in size with greater than 50% respiratory variability, suggesting right atrial pressure of 3 mmHg. IAS/Shunts: Evidence of atrial level shunting detected by color flow Doppler. Agitated saline contrast was given intravenously to evaluate for intracardiac shunting. Agitated saline contrast bubble study was positive with shunting observed within 3-6 cardiac cycles suggestive of interatrial shunt. Additional Comments: Small PFO present with mobile atrial  septum. Spectral Doppler performed. LEFT VENTRICLE PLAX 2D LVOT diam:     2.20 cm LV SV:         52 LV SV Index:   22 LVOT Area:     3.80 cm  AORTIC VALVE AV Area (Vmax):    2.03 cm AV Area (Vmean):   2.01 cm AV Area (VTI):     1.80 cm AV Vmax:           176.00 cm/s AV Vmean:          119.000 cm/s AV VTI:            0.292 m AV Peak Grad:      12.4 mmHg AV Mean Grad:      7.0 mmHg LVOT Vmax:         94.10 cm/s LVOT Vmean:        62.800 cm/s LVOT VTI:          0.138 m LVOT/AV VTI ratio: 0.47  SHUNTS Systemic VTI:  0.14 m Systemic Diam: 2.20 cm Charlton Haws MD Electronically signed by Charlton Haws MD Signature Date/Time: 11/13/2023/10:25:14 AM    Final     Scheduled Meds:  amLODipine  2.5 mg Oral Daily   aspirin EC  81 mg Oral Daily   atorvastatin  40 mg Oral q1800   clopidogrel  75 mg Oral Daily   DULoxetine  60 mg Oral BID   enoxaparin (LOVENOX) injection  40 mg Subcutaneous Q24H   ezetimibe  10 mg Oral Daily   feeding supplement  237 mL Oral BID BM   finasteride  5 mg Oral Daily   insulin aspart  0-15 Units Subcutaneous TID WC   insulin aspart  0-5 Units Subcutaneous QHS   insulin glargine-yfgn  35 Units Subcutaneous Daily   losartan  100 mg Oral Daily   metoprolol succinate  50 mg Oral Daily   mirtazapine  7.5 mg Oral QHS   tamsulosin  0.4 mg Oral QPC supper   Continuous Infusions:   LOS: 3 days   Time spent: 40 minutes  Carollee Herter, DO  [facility]  11/14/2023, 10:55 AM

## 2023-11-14 NOTE — Progress Notes (Signed)
VASCULAR LAB    Bilateral lower extremity venous duplex has been performed.  See CV proc for preliminary results.  Gave verbal report to Dr. Georges Lynch, [facility], RVT 11/14/2023, 2:39 PM

## 2023-11-14 NOTE — Progress Notes (Signed)
VASCULAR LAB    TCD bubble study has been performed.  See CV proc for preliminary results.   Allyssia Skluzacek, RVT 11/14/2023, 2:39 PM

## 2023-11-15 DIAGNOSIS — I639 Cerebral infarction, unspecified: Secondary | ICD-10-CM | POA: Diagnosis not present

## 2023-11-15 DIAGNOSIS — Q2112 Patent foramen ovale: Secondary | ICD-10-CM | POA: Diagnosis not present

## 2023-11-15 DIAGNOSIS — R531 Weakness: Secondary | ICD-10-CM | POA: Diagnosis not present

## 2023-11-15 DIAGNOSIS — I82403 Acute embolism and thrombosis of unspecified deep veins of lower extremity, bilateral: Secondary | ICD-10-CM

## 2023-11-15 LAB — GLUCOSE, CAPILLARY
Glucose-Capillary: 198 mg/dL — ABNORMAL HIGH (ref 70–99)
Glucose-Capillary: 131 mg/dL — ABNORMAL HIGH (ref 70–99)
Glucose-Capillary: 149 mg/dL — ABNORMAL HIGH (ref 70–99)

## 2023-11-15 MED ORDER — APIXABAN 5 MG PO TABS
10.0000 mg | ORAL_TABLET | Freq: Two times a day (BID) | ORAL | Status: DC
  Administered 2023-11-15 – 2023-11-16 (×3): 10 mg via ORAL
  Filled 2023-11-15 (×4): qty 2

## 2023-11-15 MED ORDER — APIXABAN 5 MG PO TABS: 5.0000 mg | ORAL_TABLET | Freq: Two times a day (BID) | ORAL | Status: DC

## 2023-11-15 MED ORDER — CALCIUM CARBONATE ANTACID 500 MG PO CHEW
800.0000 mg | CHEWABLE_TABLET | Freq: Once | ORAL | Status: AC
  Administered 2023-11-15: 800 mg via ORAL
  Filled 2023-11-15 (×2): qty 4

## 2023-11-15 NOTE — Progress Notes (Signed)
   Called pt's wife Larita Fife and gave her update on pt's condition. Him having bilateral LE DVT and need for life long anticoagulation with eliquis.  Carollee Herter, DO [facility]

## 2023-11-15 NOTE — Progress Notes (Signed)
PROGRESS NOTE    Joshua [name]  ZOX:[id number] DOB: 1951-11-02 DOA: 11/09/2023 PCP: Creola Corn, MD  Subjective: Pt seen and examined.  Transcranial doppler with bubble study order as requested by neurology. Unable to get this performed yesterday.  Neurology also requesting LE U/S to r/o DVT.  Discussed with pt that he cannot refuse PT/OT if he has any hopes of getting into SNF for rehab.  Pt states he understands and will make every effort to participate.  Explained to him that it's not about his performance with therapy.  It's about his willingnes to attempt and try that is important.     Hospital Course: HPI: Joshua [name] is a 72 y.o. male with medical history significant of prior CVA, HTN, DM, CKD, essential tremor with deep brain stimulator, prior PE no longer on chronic AC it appears.   Pt in to ED on 12/6 with onset of BLE numbness and weakness.   The patient states when he woke up 12/15 morning he was walking to the kitchen when the backs of his legs felt numb bilaterally.   He states then around 4 PM his legs started to feel weak, worse on the right than the left. He states that he was going to the bathroom and nearly fell but was able to get himself out of the bathroom and into the bed before falling. He states that when he woke up 12/16 AM he tried to get out of bed and slipped from sitting on the edge of the bed to the ground.   He denies hitting his head. He states that he is legs felt too weak and numb to support him. He denies any associated headache or back pain. He denies any dysuria or hematuria or urinary incontinence or retention. He denies any saddle anesthesia. He states that prior to yesterday he was feeling like his normal self without any recent fevers, nausea, vomiting or diarrhea.   ED work up initially was negative AM of 12/17.  Plan was for patient to get MRI of head, L spine, and T spine given BLE weakness during day on 12/17, had to be done during  day due to brain stimulator needing to be re-programed to MRI compatible mode.   Apparently however, when they tried to get MRI: "Per MRI, unable to complete scan due to internal device battery being dead. Pt returned to room for device to charge and MRI will now have to be completed at 6am tomorrow morning."  Significant Events: Admitted 11/09/2023 for generalized weakness   Significant Labs: Admission WBC 7.1 HgB 11.5, BUN 38, scr 2.02  Significant Imaging Studies: CT head shows No acute intracranial process  MRI brain shows subacute CVA of posterior left frontal lobe  TEE shows small PFO, not seen on prior echos or prior TEE Transcranial doppler study with bubble study showed very small clinically insignificant PFO.  Antibiotic Therapy: Anti-infectives (From admission, onward)    None       Procedures:   Consultants:     Assessment and Plan: * CVA (cerebral vascular accident) (HCC) 11-11-2023 unfortunately, pt's MRI brain showed subacute CVA of left posterior frontal lobe. Pt was taking ASA/Plavix when he had his CVA this time.  In September 2024, pt had another while he was on Eliquis and Asa 81 mg daily.  Pt intolerant to statins. Already on zetia and fenofibrate. His LDL in September was 70. No history of afib. But he had a CVA while on Eliquis in September 2024.  Pt is in a very tough situation. He continues to have recurrent CVA despite DAPT. He had a CVA in 07-2023 while on systemic anticoagulation and ASA 81 mg daily.  Will check limited echo to make sure he doesn't have thrombus, but I doubt it.  Initially I thought that CIR may be a way for him to get rehab. Pt is a large man. About 6 feet 4 inches. Wife states she cannot physically lift him at home.  OT has messaged me that pt wants to go to SNF now.  11-12-2023 neurology consulted. Awaiting further recs  11-13-2023 TEE showed PFO. Neurology wants transcranial dopplers with bubble study and LE U/S to r/o DVT.  Pt did  have a subclavian vein DVT around 03-2021. It's possible he could have thrown a thrombus thru his PFO that caused his CVA.  The TCD with bubble should answer the question if the PFO is significant enough.  11-14-2023 awaiting TCD with bubble study and LE U/S as requested by neurology. 11-15-2023 TCD with bubble study showed NO significant right to left shunting. Pt has bilateral LE DVT. Discussed with neurology Dr. Pearlean Brownie. Will treat his recurrent CVA with Eliquis alone. No antiplatelets(ie. Asa or plavix). Since this is his 2nd DVT, he will need lifelong anticoagulation. Start Eliquis 10 mg bid x 7 days, then decrease to 5 mg bid.  Leg DVT (deep venous thromboembolism), acute, bilateral (HCC) 11-15-2023 will start Eliquis DVT regimen. Pt will need lifelong anticoagulation given that this is his 2nd DVT unprovoked.  Weakness 11-11-2023 worsening since Sunday night due to leg weakness. Likely due to CVA.  11-12-2023 pt wants to go to SNF at discharge. Wife does not want CIR. CM aware.  11-13-2023 CM looking into SNF placement. 11-14-2023 continue to search for SNF placement. Pt states he will work with PT/OT when asked. 11-15-2023 CM continuing bed search  PFO (patent foramen ovale) 11-13-2023 I have reviewed all of pt's prior surface echos and 1 TEE in 03-2021. None report any intra-atrial shunts. Today's TEE shows small PFO. Neurology wants transcranial dopplers with bubble study.  Depending on this result, pt may need to go back on systemic anticoagulation, possible referral to cards for PFO closure, etc. Neurology to f/u on TCD and further recommendations.  11-14-2023 awaiting for neurology to perform TCD with bubble study.  11-15-2023 TCD with bubble yesterday showed no significant right to left shunting.  Dr. Pearlean Brownie does not think pt is a candidate for PFO closure. Pt will need to remain on life long systemic anticoagulation due to recurrent DVTs.  Essential tremor 11-11-2023 Resume deep  brain stimulator treatment once this is charged. Pt states this has been less and less effective since he had a CVA in the area where his deep brain stimulator is attached.  11-12-2023 to 11-15-2023 stable  CKD stage 3b, GFR 30-44 ml/min (HCC) - baseline SCr 2.0 11-11-2023 stable. Will repeat CMP in AM. 11-12-2023 Scr 1.99. stable.  History of pulmonary embolism 11-11-2023 Looks like he is no longer on long term AC at this point in time other than ASA + Plavix.  11-15-2023 pt with bilateral LE DVT. He will need lifelong systemic anticoagulation. Starting eliquis today.  Overweight (BMI 25.0-29.9) BMI 29.58  Hyperlipidemia 11-11-23 pt is statin intolerant. On zetia and fenofibrate. Last LDL in 07-2023 was 70. Will resend LDL. Check lipoprotein A. 11-12-2023 LCL 59  Essential hypertension 11-11-2023 stable. BP stable. 11-12-2023 thru 11-15-2023. stable. Remains on Toprol-xl 50 mg qday, cozaar 100 mg qday.,  norvasc 2.5 mg qday  OSA on CPAP 11-11-2023 Cont CPAP per home settings 11-12-2023 through 11-15-2023. stable  Diabetes mellitus type 2 in obese (HCC) 11-11-2023 continue with lantus and SSI. CBG acceptable ranges. 11-12-2023 through 11-15-2023 stable. Acceptable CBG ranges       DVT prophylaxis:  apixaban (ELIQUIS) tablet 10 mg  apixaban (ELIQUIS) tablet 5 mg     Code Status: Full Code Family Communication: no family at bedside Disposition Plan: SNF Reason for continuing need for hospitalization: medically stable for DC to SNF.  Objective: Vitals:   11/14/23 1626 11/14/23 2015 11/15/23 0527 11/15/23 0839  BP: 127/76 118/70 139/86 130/74  Pulse: 86 85 83 84  Resp: 15 18 18  (!) 22  Temp: 97.7 F (36.5 C) 98.2 F (36.8 C) 97.9 F (36.6 C) 98.1 F (36.7 C)  TempSrc: Oral Oral Oral Oral  SpO2: 96% 95% 95% 96%  Weight:      Height:        Intake/Output Summary (Last 24 hours) at 11/15/2023 1147 Last data filed at 11/15/2023 0513 Gross per 24 hour  Intake --   Output 800 ml  Net -800 ml   Filed Weights   11/10/23 2137  Weight: 110.2 kg    Examination:  Physical Exam Vitals and nursing note reviewed.  Constitutional:      General: He is not in acute distress.    Appearance: He is not toxic-appearing.  HENT:     Head: Normocephalic and atraumatic.     Nose: Nose normal.  Cardiovascular:     Rate and Rhythm: Normal rate and regular rhythm.  Pulmonary:     Effort: Pulmonary effort is normal.  Abdominal:     General: Bowel sounds are normal.     Palpations: Abdomen is soft.  Musculoskeletal:     Right lower leg: No edema.     Left lower leg: No edema.  Skin:    General: Skin is warm and dry.     Capillary Refill: Capillary refill takes less than 2 seconds.  Neurological:     Mental Status: He is alert and oriented to person, place, and time.     Data Reviewed: I have personally reviewed following labs and imaging studies  CBC: Recent Labs  Lab 11/09/23 1406  WBC 7.1  NEUTROABS 3.8  HGB 11.5*  HCT 35.9*  MCV 92.1  PLT 276   Basic Metabolic Panel: Recent Labs  Lab 11/09/23 1406 11/12/23 0705  NA 142 139  K 4.2 3.7  CL 109 109  CO2 23 24  GLUCOSE 90 169*  BUN 38* 37*  CREATININE 2.02* 1.99*  CALCIUM 9.3 8.6*   GFR: Estimated Creatinine Clearance: 44.3 mL/min (A) (by C-G formula based on SCr of 1.99 mg/dL (H)). Liver Function Tests: Recent Labs  Lab 11/09/23 1406 11/12/23 0705  AST 35 31  ALT 30 28  ALKPHOS 101 87  BILITOT 0.7 0.5  PROT 6.6 5.8*  ALBUMIN 3.3* 2.8*   BNP (last 3 results) Recent Labs    06/25/23 1000  BNP 27.5   CBG: Recent Labs  Lab 11/13/23 2058 11/14/23 0832 11/14/23 1148 11/14/23 1630 11/14/23 2016  GLUCAP 156* 172* 200* 196* 178*   Radiology Studies: VAS [place] LOWER EXTREMITY VENOUS (DVT) Result Date: 11/15/2023  Lower Venous DVT Study Patient Name:  [name]  Date of Exam:   11/14/2023 Medical Rec #: [id number]        Accession #:    [id number] Date of Birth:  [birth year]  Patient Gender: M Patient Age:   44 years Exam Location:  [facility] Procedure:      VAS [place] LOWER EXTREMITY VENOUS (DVT) Referring Phys: Bettyjo Lundblad --------------------------------------------------------------------------------  Indications: Stroke.  Risk Factors: DVT 04/10/2017 left posterior tibial and peroneal veins. Comparison Study: Prior negative left LEV done 07/01/18, 06/18/18. Performing Technologist: Sherren Kerns RVS  Examination Guidelines: A complete evaluation includes B-mode imaging, spectral Doppler, color Doppler, and power Doppler as needed of all accessible portions of each vessel. Bilateral testing is considered an integral part of a complete examination. Limited examinations for reoccurring indications may be performed as noted. The reflux portion of the exam is performed with the patient in reverse Trendelenburg.  +---------+---------------+---------+-----------+----------+--------------+ RIGHT    CompressibilityPhasicitySpontaneityPropertiesThrombus Aging +---------+---------------+---------+-----------+----------+--------------+ CFV      Full           Yes      Yes                                 +---------+---------------+---------+-----------+----------+--------------+ SFJ      Full                                                        +---------+---------------+---------+-----------+----------+--------------+ FV Prox  Full                                                        +---------+---------------+---------+-----------+----------+--------------+ FV Mid   Full                                                        +---------+---------------+---------+-----------+----------+--------------+ FV DistalFull                                                        +---------+---------------+---------+-----------+----------+--------------+ PFV      Full                                                         +---------+---------------+---------+-----------+----------+--------------+ POP      Full           Yes      Yes                                 +---------+---------------+---------+-----------+----------+--------------+ PTV      None                                         Acute          +---------+---------------+---------+-----------+----------+--------------+  PERO     None                                         Acute          +---------+---------------+---------+-----------+----------+--------------+ TP trunk None                                         Acute          +---------+---------------+---------+-----------+----------+--------------+   +---------+---------------+---------+-----------+----------+--------------+ LEFT     CompressibilityPhasicitySpontaneityPropertiesThrombus Aging +---------+---------------+---------+-----------+----------+--------------+ CFV      Full                                                        +---------+---------------+---------+-----------+----------+--------------+ SFJ      Full                                                        +---------+---------------+---------+-----------+----------+--------------+ FV Prox  Full                                                        +---------+---------------+---------+-----------+----------+--------------+ FV Mid   Full                                                        +---------+---------------+---------+-----------+----------+--------------+ FV DistalFull                                                        +---------+---------------+---------+-----------+----------+--------------+ PFV      Full                                                        +---------+---------------+---------+-----------+----------+--------------+ POP      Full           Yes      Yes                                  +---------+---------------+---------+-----------+----------+--------------+ PTV      None                                         Acute          +---------+---------------+---------+-----------+----------+--------------+  PERO     None                                         Acute          +---------+---------------+---------+-----------+----------+--------------+     Summary: RIGHT: - Findings consistent with acute deep vein thrombosis involving the right posterior tibial veins, right peroneal veins, and tibioperoneal trunk.   LEFT: - Findings consistent with acute deep vein thrombosis involving the left posterior tibial veins, and left peroneal veins.   *See table(s) above for measurements and observations. Electronically signed by Coral Else MD on 11/15/2023 at 12:12:22 AM.    Final    VAS [place] TRANSCRANIAL DOPPLER W BUBBLES Result Date: 11/14/2023  Transcranial Doppler with Bubble Patient Name:  [name]  Date of Exam:   11/14/2023 Medical Rec #: [id number]        Accession #:    [id number] Date of Birth: [birth year]        Patient Gender: M Patient Age:   71 years Exam Location:  [facility] Procedure:      VAS [place] TRANSCRANIAL DOPPLER W BUBBLES Referring Phys: Tashanti Dalporto --------------------------------------------------------------------------------  Indications: Stroke. History: Small PFO by TEE. Acute DVT bilateral posterior tibial and peroneal veins. Limitations for diagnostic windows: Unable to insonate right transtemporal window. Unable to insonate left transtemporal window. Comparison Study: No prior study Performing Technologist: Sherren Kerns RVS  Examination Guidelines: A complete evaluation includes B-mode imaging, spectral Doppler, color Doppler, and power Doppler as needed of all accessible portions of each vessel. Bilateral testing is considered an integral part of a complete examination. Limited examinations for reoccurring indications may be performed as noted.   Summary: No HITS at rest, Less than 10 HITS during Valsalva. Transcranial Doppler Bubble study with trivial evidence of right to left clinically insignificant intracardiac communication.  Positive TCD Bubble study indicative of a trivial right to left shunt unlikely to be of clinical significance  Diagnosing physician: Delia Heady MD Electronically signed by Delia Heady MD on 11/14/2023 at 5:11:59 PM.    Final     Scheduled Meds:  amLODipine  2.5 mg Oral Daily   apixaban  10 mg Oral BID   Followed by   Melene Muller ON 11/22/2023] apixaban  5 mg Oral BID   atorvastatin  40 mg Oral q1800   DULoxetine  60 mg Oral BID   ezetimibe  10 mg Oral Daily   feeding supplement  237 mL Oral BID BM   finasteride  5 mg Oral Daily   insulin aspart  0-15 Units Subcutaneous TID WC   insulin aspart  0-5 Units Subcutaneous QHS   insulin glargine-yfgn  35 Units Subcutaneous Daily   losartan  100 mg Oral Daily   metoprolol succinate  50 mg Oral Daily   mirtazapine  7.5 mg Oral QHS   tamsulosin  0.4 mg Oral QPC supper   Continuous Infusions:   LOS: 4 days   Time spent: 40 minutes  Carollee Herter, DO  [facility]  11/15/2023, 11:47 AM

## 2023-11-15 NOTE — Plan of Care (Signed)
  Problem: Education: Goal: Knowledge of General Education information will improve Description: Including pain rating scale, medication(s)/side effects and non-pharmacologic comfort measures Outcome: Progressing   Problem: Health Behavior/Discharge Planning: Goal: Ability to manage health-related needs will improve Outcome: Progressing   Problem: Clinical Measurements: Goal: Ability to maintain clinical measurements within normal limits will improve Outcome: Progressing Goal: Will remain free from infection Outcome: Progressing Goal: Diagnostic test results will improve Outcome: Progressing Goal: Respiratory complications will improve Outcome: Progressing Goal: Cardiovascular complication will be avoided Outcome: Progressing   Problem: Activity: Goal: Risk for activity intolerance will decrease Outcome: Progressing   Problem: Nutrition: Goal: Adequate nutrition will be maintained Outcome: Progressing   Problem: Coping: Goal: Level of anxiety will decrease Outcome: Progressing   Problem: Elimination: Goal: Will not experience complications related to bowel motility Outcome: Progressing Goal: Will not experience complications related to urinary retention Outcome: Progressing   Problem: Pain Management: Goal: General experience of comfort will improve Outcome: Progressing   Problem: Safety: Goal: Ability to remain free from injury will improve Outcome: Progressing   Problem: Skin Integrity: Goal: Risk for impaired skin integrity will decrease Outcome: Progressing   Problem: Education: Goal: Ability to describe self-care measures that may prevent or decrease complications (Diabetes Survival Skills Education) will improve Outcome: Progressing Goal: Individualized Educational Video(s) Outcome: Progressing   Problem: Coping: Goal: Ability to adjust to condition or change in health will improve Outcome: Progressing   Problem: Fluid Volume: Goal: Ability to  maintain a balanced intake and output will improve Outcome: Progressing   Problem: Health Behavior/Discharge Planning: Goal: Ability to identify and utilize available resources and services will improve Outcome: Progressing Goal: Ability to manage health-related needs will improve Outcome: Progressing   Problem: Metabolic: Goal: Ability to maintain appropriate glucose levels will improve Outcome: Progressing   Problem: Nutritional: Goal: Maintenance of adequate nutrition will improve Outcome: Progressing Goal: Progress toward achieving an optimal weight will improve Outcome: Progressing   Problem: Skin Integrity: Goal: Risk for impaired skin integrity will decrease Outcome: Progressing   Problem: Tissue Perfusion: Goal: Adequacy of tissue perfusion will improve Outcome: Progressing   Problem: Education: Goal: Knowledge of disease or condition will improve Outcome: Progressing Goal: Knowledge of secondary prevention will improve (MUST DOCUMENT ALL) Outcome: Progressing Goal: Knowledge of patient specific risk factors will improve Loraine Leriche N/A or DELETE if not current risk factor) Outcome: Progressing   Problem: Ischemic Stroke/TIA Tissue Perfusion: Goal: Complications of ischemic stroke/TIA will be minimized Outcome: Progressing   Problem: Coping: Goal: Will verbalize positive feelings about self Outcome: Progressing Goal: Will identify appropriate support needs Outcome: Progressing   Problem: Health Behavior/Discharge Planning: Goal: Ability to manage health-related needs will improve Outcome: Progressing Goal: Goals will be collaboratively established with patient/family Outcome: Progressing   Problem: Self-Care: Goal: Ability to participate in self-care as condition permits will improve Outcome: Progressing Goal: Verbalization of feelings and concerns over difficulty with self-care will improve Outcome: Progressing Goal: Ability to communicate needs accurately  will improve Outcome: Progressing   Problem: Nutrition: Goal: Risk of aspiration will decrease Outcome: Progressing Goal: Dietary intake will improve Outcome: Progressing

## 2023-11-15 NOTE — Progress Notes (Signed)
RT called to bedside to place patient on cpap for a nap. Patient tolerating well at this time. RT will continue to monitor as needed.

## 2023-11-15 NOTE — Assessment & Plan Note (Signed)
11-15-2023 will start Eliquis DVT regimen. Pt will need lifelong anticoagulation given that this is his 2nd DVT unprovoked.

## 2023-11-16 ENCOUNTER — Other Ambulatory Visit (HOSPITAL_COMMUNITY): Payer: Self-pay

## 2023-11-16 ENCOUNTER — Telehealth (HOSPITAL_COMMUNITY): Payer: Self-pay | Admitting: Pharmacy Technician

## 2023-11-16 ENCOUNTER — Ambulatory Visit: Payer: Medicare PPO

## 2023-11-16 ENCOUNTER — Encounter: Payer: Self-pay | Admitting: *Deleted

## 2023-11-16 ENCOUNTER — Encounter (HOSPITAL_COMMUNITY): Payer: Self-pay | Admitting: Cardiovascular Disease

## 2023-11-16 DIAGNOSIS — Z1159 Encounter for screening for other viral diseases: Secondary | ICD-10-CM | POA: Diagnosis not present

## 2023-11-16 DIAGNOSIS — Z7901 Long term (current) use of anticoagulants: Secondary | ICD-10-CM | POA: Diagnosis not present

## 2023-11-16 DIAGNOSIS — I639 Cerebral infarction, unspecified: Secondary | ICD-10-CM | POA: Diagnosis not present

## 2023-11-16 DIAGNOSIS — R278 Other lack of coordination: Secondary | ICD-10-CM | POA: Diagnosis not present

## 2023-11-16 DIAGNOSIS — R109 Unspecified abdominal pain: Secondary | ICD-10-CM | POA: Diagnosis not present

## 2023-11-16 DIAGNOSIS — R059 Cough, unspecified: Secondary | ICD-10-CM | POA: Diagnosis not present

## 2023-11-16 DIAGNOSIS — I82403 Acute embolism and thrombosis of unspecified deep veins of lower extremity, bilateral: Secondary | ICD-10-CM | POA: Diagnosis not present

## 2023-11-16 DIAGNOSIS — F330 Major depressive disorder, recurrent, mild: Secondary | ICD-10-CM | POA: Diagnosis not present

## 2023-11-16 DIAGNOSIS — R531 Weakness: Secondary | ICD-10-CM | POA: Diagnosis not present

## 2023-11-16 DIAGNOSIS — E785 Hyperlipidemia, unspecified: Secondary | ICD-10-CM | POA: Diagnosis not present

## 2023-11-16 DIAGNOSIS — M6281 Muscle weakness (generalized): Secondary | ICD-10-CM | POA: Diagnosis not present

## 2023-11-16 DIAGNOSIS — I1310 Hypertensive heart and chronic kidney disease without heart failure, with stage 1 through stage 4 chronic kidney disease, or unspecified chronic kidney disease: Secondary | ICD-10-CM | POA: Diagnosis not present

## 2023-11-16 DIAGNOSIS — G459 Transient cerebral ischemic attack, unspecified: Secondary | ICD-10-CM | POA: Diagnosis not present

## 2023-11-16 DIAGNOSIS — M6259 Muscle wasting and atrophy, not elsewhere classified, multiple sites: Secondary | ICD-10-CM | POA: Diagnosis not present

## 2023-11-16 DIAGNOSIS — K5909 Other constipation: Secondary | ICD-10-CM | POA: Diagnosis not present

## 2023-11-16 DIAGNOSIS — E119 Type 2 diabetes mellitus without complications: Secondary | ICD-10-CM | POA: Diagnosis not present

## 2023-11-16 DIAGNOSIS — I82509 Chronic embolism and thrombosis of unspecified deep veins of unspecified lower extremity: Secondary | ICD-10-CM | POA: Diagnosis not present

## 2023-11-16 DIAGNOSIS — R1312 Dysphagia, oropharyngeal phase: Secondary | ICD-10-CM | POA: Diagnosis not present

## 2023-11-16 DIAGNOSIS — E1142 Type 2 diabetes mellitus with diabetic polyneuropathy: Secondary | ICD-10-CM | POA: Diagnosis not present

## 2023-11-16 DIAGNOSIS — F325 Major depressive disorder, single episode, in full remission: Secondary | ICD-10-CM | POA: Diagnosis not present

## 2023-11-16 DIAGNOSIS — J069 Acute upper respiratory infection, unspecified: Secondary | ICD-10-CM | POA: Diagnosis not present

## 2023-11-16 DIAGNOSIS — Z7401 Bed confinement status: Secondary | ICD-10-CM | POA: Diagnosis not present

## 2023-11-16 DIAGNOSIS — G250 Essential tremor: Secondary | ICD-10-CM | POA: Diagnosis not present

## 2023-11-16 DIAGNOSIS — R051 Acute cough: Secondary | ICD-10-CM | POA: Diagnosis not present

## 2023-11-16 LAB — GLUCOSE, CAPILLARY
Glucose-Capillary: 188 mg/dL — ABNORMAL HIGH (ref 70–99)
Glucose-Capillary: 188 mg/dL — ABNORMAL HIGH (ref 70–99)

## 2023-11-16 MED ORDER — APIXABAN (ELIQUIS) VTE STARTER PACK (10MG AND 5MG): ORAL_TABLET | ORAL | Status: DC

## 2023-11-16 MED ORDER — AMLODIPINE BESYLATE 2.5 MG PO TABS: 2.5000 mg | ORAL_TABLET | Freq: Every day | ORAL | Status: DC

## 2023-11-16 MED ORDER — ATORVASTATIN CALCIUM 40 MG PO TABS: 40.0000 mg | ORAL_TABLET | Freq: Every day | ORAL | Status: DC

## 2023-11-16 MED ORDER — INSULIN ASPART 100 UNIT/ML IJ SOLN: 0.0000 [IU] | Freq: Three times a day (TID) | INTRAMUSCULAR | Status: DC

## 2023-11-16 MED ORDER — POLYETHYLENE GLYCOL 3350 17 G PO PACK: 17.0000 g | PACK | Freq: Every day | ORAL | Status: DC

## 2023-11-16 MED ORDER — TOUJEO SOLOSTAR 300 UNIT/ML ~~LOC~~ SOPN: 35.0000 [IU] | PEN_INJECTOR | Freq: Every day | SUBCUTANEOUS | Status: DC

## 2023-11-16 MED ORDER — POLYETHYLENE GLYCOL 3350 17 G PO PACK
17.0000 g | PACK | Freq: Every day | ORAL | Status: DC
  Administered 2023-11-16: 17 g via ORAL
  Filled 2023-11-16: qty 1

## 2023-11-16 NOTE — TOC Transition Note (Signed)
Transition of Care [facility]) - Discharge Note   Patient Details  Name: Joshua [name] MRN: [id number] Date of Birth: [birth year]  Transition of Care [facility]) CM/SW Contact:  Mashonda Broski A [place], Theresia Majors Phone Number: 11/16/2023, 1:12 PM   Clinical Narrative:     Patient will DC to: [facility]  Anticipated DC date: 11/16/23  Family notified: Richardean Chimera  Transport by: Sharin Mons      Per MD patient ready for DC to  [facility]. RN, patient, patient's family, and facility notified of DC. Discharge Summary and FL2 sent to facility. RN to call report prior to discharge (Room 103p, [phone]). DC packet on chart. Ambulance transport requested for patient.     CSW will sign off for now as social work intervention is no longer needed. Please consult [place] again if new needs arise.   Final next level of care: Skilled Nursing Facility Barriers to Discharge: Barriers Resolved   Patient Goals and CMS Choice            Discharge Placement              Patient chooses bed at: [facility] Patient to be transferred to facility by: [facility] Name of family member notified: Kaesyn Stieg Patient and family notified of of transfer: 11/16/23  Discharge Plan and Services Additional resources added to the After Visit Summary for                                       Social Drivers of Health (SDOH) Interventions SDOH Screenings   Food Insecurity: No Food Insecurity (11/11/2023)  Housing: Low Risk  (11/11/2023)  Transportation Needs: No Transportation Needs (11/11/2023)  Utilities: Not At Risk (11/11/2023)  Depression (PHQ2-9): Low Risk  (04/25/2020)  Tobacco Use: Medium Risk (11/11/2023)     Readmission Risk Interventions     No data to display

## 2023-11-16 NOTE — TOC Progression Note (Signed)
Transition of Care [facility]) - Progression Note    Patient Details  Name: Joshua [name] MRN: [id number] Date of Birth: [birth year]  Transition of Care [facility]) CM/SW Contact  Linna Thebeau A [place], [place] Phone Number: 11/16/2023, 10:39 AM  Clinical Narrative:     CSW started insurance authorization request for [facility].   Insurance authorization approved.  Reference ID [id number]  Approval Dates:  11/16/2023-11/19/2023  Pt medically stable for discharge. Bed available at [facility] today.        Expected Discharge Plan and Services         Expected Discharge Date: 11/16/23                                     Social Determinants of Health (SDOH) Interventions SDOH Screenings   Food Insecurity: No Food Insecurity (11/11/2023)  Housing: Low Risk  (11/11/2023)  Transportation Needs: No Transportation Needs (11/11/2023)  Utilities: Not At Risk (11/11/2023)  Depression (PHQ2-9): Low Risk  (04/25/2020)  Tobacco Use: Medium Risk (11/11/2023)    Readmission Risk Interventions     No data to display

## 2023-11-16 NOTE — Progress Notes (Signed)
Physical Therapy Treatment Patient Details Name: Joshua [name] MRN: [id number] DOB: July 02, 1951 Today's Date: 11/16/2023   History of Present Illness 72 y.o. male admitted 12/16  with onset of BLE numbness and weakness. 12/22 BIL LE DVTs, started on Eliquis. PMHx: prior CVA, HTN, DM, CKD, essential tremor with deep brain stimulator, prior PE.    PT Comments  Eager to participate with therapy today. Mod assist to stand from bed and recliner, needs heavy cues for set-up recall and technique. Progressed with gait training, tolerated up to 25 feet in room today, min assist. LEs become tremulous as he fatigues but did not buckle. Reviewed LE exercises. Agreeable to sit in chair several times per day with staff assist for transfers. Brother present and supportive. Patient will continue to benefit from skilled physical therapy services to further improve independence with functional mobility. Patient will benefit from continued inpatient follow up therapy, <3 hours/day.     If plan is discharge home, recommend the following: A little help with walking and/or transfers;A little help with bathing/dressing/bathroom;Assistance with cooking/housework;Assist for transportation;Help with stairs or ramp for entrance   Can travel by private vehicle     Yes  Equipment Recommendations  None recommended by PT    Recommendations for Other Services       Precautions / Restrictions Precautions Precautions: Fall Precaution Comments: Recent Rt distal humerus fracture fx (5 weeks ago) Pt states use as tolerated; wife reports told not to push/pull but okay for ROM exercises. Required Braces or Orthoses: Other Brace Other Brace: elbow spint as needed Restrictions Weight Bearing Restrictions Per Provider Order: No Other Position/Activity Restrictions: Has Rt distal humerus fracture     Mobility  Bed Mobility Overal bed mobility: Needs Assistance Bed Mobility: Supine to Sit     Supine to sit: Min  assist, HOB elevated, Used rails     General bed mobility comments: Min assist for trunk support to rise, block RUE from pulling through rail. Max VC for technique.    Transfers Overall transfer level: Needs assistance Equipment used: Rolling walker (2 wheels) Transfers: Sit to/from Stand, Bed to chair/wheelchair/BSC Sit to Stand: Mod assist, From elevated surface   Step pivot transfers: Min assist       General transfer comment: Mod assist for boost to stand from elevated bed, then recliner. Lt hand to push up from bed, RUE supported by therapist for boost, avoiding stress to distal humorous fx. Perfromed from recliner with additional effort. Pt needs cues to recall steps - scooting to edge of surface, leaning forward, and push up through LUE only. Min assit for RW control with step pivot, minor tremor.    Ambulation/Gait Ambulation/Gait assistance: Min assist Gait Distance (Feet): 25 Feet Assistive device: Rolling walker (2 wheels) Gait Pattern/deviations: Step-through pattern, Decreased stride length, Shuffle, Trunk flexed, Narrow base of support Gait velocity: decr Gait velocity interpretation: <1.31 ft/sec, indicative of household ambulator   General Gait Details: Min assist for RW control, RUE supported near axilla to control humorous and prevent WB through distal elbow (needs cues). Minor instability, cues for larger step length and foot clearance. LEs become more tremulous with distance but no overt buckling with distance covered today.   Stairs             Wheelchair Mobility     Tilt Bed    Modified Rankin (Stroke Patients Only)       Balance Overall balance assessment: Needs assistance Sitting-balance support: No upper extremity supported, Feet supported Sitting balance-Leahy  Scale: Fair     Standing balance support: During functional activity, Reliant on assistive device for balance, Bilateral upper extremity supported Standing balance-Leahy Scale:  Poor                              Cognition Arousal: Alert Behavior During Therapy: WFL for tasks assessed/performed Overall Cognitive Status: No family/caregiver present to determine baseline cognitive functioning                                 General Comments: Reduced safety awareness; requires VC for sequencing.        Exercises General Exercises - Lower Extremity Ankle Circles/Pumps: AROM, Both, 10 reps, Seated Long Arc Quad: Strengthening, Both, 10 reps, Seated Hip ABduction/ADduction: Strengthening, Both, 10 reps, Seated    General Comments General comments (skin integrity, edema, etc.): Brother present, very supportive      Pertinent Vitals/Pain Pain Assessment Pain Assessment: No/denies pain    Home Living                          Prior Function            PT Goals (current goals can now be found in the care plan section) Acute Rehab PT Goals Patient Stated Goal: SNF PT Goal Formulation: With patient/family Time For Goal Achievement: 11/25/23 Potential to Achieve Goals: Good Progress towards PT goals: Progressing toward goals    Frequency    Min 1X/week      PT Plan      Co-evaluation              AM-PAC PT "6 Clicks" Mobility   Outcome Measure  Help needed turning from your back to your side while in a flat bed without using bedrails?: A Little Help needed moving from lying on your back to sitting on the side of a flat bed without using bedrails?: A Little Help needed moving to and from a bed to a chair (including a wheelchair)?: A Little Help needed standing up from a chair using your arms (e.g., wheelchair or bedside chair)?: A Little Help needed to walk in hospital room?: A Little Help needed climbing 3-5 steps with a railing? : Total 6 Click Score: 16    End of Session Equipment Utilized During Treatment: Gait belt Activity Tolerance: Patient tolerated treatment well Patient left: in  chair;with call bell/phone within reach;with chair alarm set;with family/visitor present   PT Visit Diagnosis: Unsteadiness on feet (R26.81);Repeated falls (R29.6);Muscle weakness (generalized) (M62.81);History of falling (Z91.81);Difficulty in walking, not elsewhere classified (R26.2);Other symptoms and signs involving the nervous system (R29.898)     Time: 5573-2202 PT Time Calculation (min) (ACUTE ONLY): 22 min  Charges:    $Gait Training: 8-22 mins PT General Charges $$ ACUTE PT VISIT: 1 Visit                     Kathlyn Sacramento, PT, DPT [facility]  Rehabilitation Services Physical Therapist Office: [phone] Website: [email or web address]    Berton Mount 11/16/2023, 10:19 AM

## 2023-11-16 NOTE — Telephone Encounter (Signed)
Patient Product/process development scientist completed.    The patient is insured through [employer]. Patient has Medicare and is not eligible for a copay card, but may be able to apply for patient assistance, if available.    Ran test claim for Eliquis 5 mg and the current 30 day co-pay is $0.00.   This test claim was processed through [facility]- copay amounts may vary at other pharmacies due to pharmacy/plan contracts, or as the patient moves through the different stages of their insurance plan.     Roland Earl, CPHT Pharmacy Technician III Certified Patient Advocate [facility] Patient Advocate Team Direct Number: [phone]  Fax: [phone]

## 2023-11-16 NOTE — Plan of Care (Signed)
  Problem: Education: Goal: Knowledge of General Education information will improve Description: Including pain rating scale, medication(s)/side effects and non-pharmacologic comfort measures Outcome: Progressing   Problem: Health Behavior/Discharge Planning: Goal: Ability to manage health-related needs will improve Outcome: Progressing   Problem: Clinical Measurements: Goal: Ability to maintain clinical measurements within normal limits will improve Outcome: Progressing Goal: Will remain free from infection Outcome: Progressing Goal: Diagnostic test results will improve Outcome: Progressing Goal: Respiratory complications will improve Outcome: Progressing Goal: Cardiovascular complication will be avoided Outcome: Progressing   Problem: Activity: Goal: Risk for activity intolerance will decrease Outcome: Progressing   Problem: Nutrition: Goal: Adequate nutrition will be maintained Outcome: Progressing   Problem: Coping: Goal: Level of anxiety will decrease Outcome: Progressing   Problem: Elimination: Goal: Will not experience complications related to bowel motility Outcome: Progressing Goal: Will not experience complications related to urinary retention Outcome: Progressing   Problem: Pain Management: Goal: General experience of comfort will improve Outcome: Progressing   Problem: Safety: Goal: Ability to remain free from injury will improve Outcome: Progressing   Problem: Skin Integrity: Goal: Risk for impaired skin integrity will decrease Outcome: Progressing   Problem: Education: Goal: Ability to describe self-care measures that may prevent or decrease complications (Diabetes Survival Skills Education) will improve Outcome: Progressing Goal: Individualized Educational Video(s) Outcome: Progressing   Problem: Coping: Goal: Ability to adjust to condition or change in health will improve Outcome: Progressing   Problem: Fluid Volume: Goal: Ability to  maintain a balanced intake and output will improve Outcome: Progressing   Problem: Health Behavior/Discharge Planning: Goal: Ability to identify and utilize available resources and services will improve Outcome: Progressing Goal: Ability to manage health-related needs will improve Outcome: Progressing   Problem: Metabolic: Goal: Ability to maintain appropriate glucose levels will improve Outcome: Progressing   Problem: Nutritional: Goal: Maintenance of adequate nutrition will improve Outcome: Progressing Goal: Progress toward achieving an optimal weight will improve Outcome: Progressing   Problem: Skin Integrity: Goal: Risk for impaired skin integrity will decrease Outcome: Progressing   Problem: Tissue Perfusion: Goal: Adequacy of tissue perfusion will improve Outcome: Progressing   Problem: Education: Goal: Knowledge of disease or condition will improve Outcome: Progressing Goal: Knowledge of secondary prevention will improve (MUST DOCUMENT ALL) Outcome: Progressing Goal: Knowledge of patient specific risk factors will improve Loraine Leriche N/A or DELETE if not current risk factor) Outcome: Progressing   Problem: Ischemic Stroke/TIA Tissue Perfusion: Goal: Complications of ischemic stroke/TIA will be minimized Outcome: Progressing   Problem: Coping: Goal: Will verbalize positive feelings about self Outcome: Progressing Goal: Will identify appropriate support needs Outcome: Progressing   Problem: Health Behavior/Discharge Planning: Goal: Ability to manage health-related needs will improve Outcome: Progressing Goal: Goals will be collaboratively established with patient/family Outcome: Progressing   Problem: Self-Care: Goal: Ability to participate in self-care as condition permits will improve Outcome: Progressing Goal: Verbalization of feelings and concerns over difficulty with self-care will improve Outcome: Progressing Goal: Ability to communicate needs accurately  will improve Outcome: Progressing   Problem: Nutrition: Goal: Risk of aspiration will decrease Outcome: Progressing Goal: Dietary intake will improve Outcome: Progressing

## 2023-11-16 NOTE — Discharge Instructions (Addendum)
Information on my medicine - ELIQUIS (apixaban)   Why was Eliquis prescribed for you? Eliquis was prescribed to treat blood clots that may have been found in the veins of your legs (deep vein thrombosis) or in your lungs (pulmonary embolism) and to reduce the risk of them occurring again.  What do You need to know about Eliquis ? The starting dose is 10 mg (two 5 mg tablets) taken TWICE daily for the FIRST SEVEN (7) DAYS, then on  11/22/23  the dose is reduced to ONE 5 mg tablet taken TWICE daily.  Eliquis may be taken with or without food.   Try to take the dose about the same time in the morning and in the evening. If you have difficulty swallowing the tablet whole please discuss with your pharmacist how to take the medication safely.  Take Eliquis exactly as prescribed and DO NOT stop taking Eliquis without talking to the doctor who prescribed the medication.  Stopping may increase your risk of developing a new blood clot.  Refill your prescription before you run out.  After discharge, you should have regular check-up appointments with your healthcare provider that is prescribing your Eliquis.    What do you do if you miss a dose? If a dose of ELIQUIS is not taken at the scheduled time, take it as soon as possible on the same day and twice-daily administration should be resumed. The dose should not be doubled to make up for a missed dose.  Important Safety Information A possible side effect of Eliquis is bleeding. You should call your healthcare provider right away if you experience any of the following: Bleeding from an injury or your nose that does not stop. Unusual colored urine (red or dark brown) or unusual colored stools (red or black). Unusual bruising for unknown reasons. A serious fall or if you hit your head (even if there is no bleeding).  Some medicines may interact with Eliquis and might increase your risk of bleeding or clotting while on Eliquis. To help avoid  this, consult your healthcare provider or pharmacist prior to using any new prescription or non-prescription medications, including herbals, vitamins, non-steroidal anti-inflammatory drugs (NSAIDs) and supplements.  This website has more information on Eliquis (apixaban): http://www.eliquis.com/eliquis/home

## 2023-11-16 NOTE — Discharge Summary (Addendum)
Physician Discharge Summary  Joshua [name] ZOX:[id number] DOB: [birth year] DOA: 11/09/2023  PCP: Joshua Corn, MD  Admit date: 11/09/2023 Discharge date: 11/16/2023  Admitted From: home Discharge disposition: SNF   Recommendations for Outpatient Follow-Up:   Will need lifelong eliquis Monitor blood sugars CPAP QHS   Discharge Diagnosis:   Principal Problem:   CVA (cerebral vascular accident) [facility]) Active Problems:   Weakness   Leg DVT (deep venous thromboembolism), acute, bilateral (HCC)   Essential tremor   PFO (patent foramen ovale)   Diabetes mellitus type 2 in obese (HCC)   OSA on CPAP   Essential hypertension   Hyperlipidemia   Overweight (BMI 25.0-29.9)   History of pulmonary embolism   CKD stage 3b, GFR 30-44 ml/min (HCC) - baseline SCr 2.0    Discharge Condition: Improved.  Diet recommendation: Low sodium, heart healthy.  Carbohydrate-modified.  Regular.  Wound care: None.  Code status: Full.   History of Present Illness:   Joshua [name] is a 72 y.o. male with medical history significant of prior CVA, HTN, DM, CKD, essential tremor with deep brain stimulator, prior PE no longer on chronic AC it appears.   Pt in to ED on 12/6 with onset of BLE numbness and weakness.   The patient states when he woke up 12/15 morning he was walking to the kitchen when the backs of his legs felt numb bilaterally.   He states then around 4 PM his legs started to feel weak, worse on the right than the left. He states that he was going to the bathroom and nearly fell but was able to get himself out of the bathroom and into the bed before falling. He states that when he woke up 12/16 AM he tried to get out of bed and slipped from sitting on the edge of the bed to the ground.   He denies hitting his head. He states that he is legs felt too weak and numb to support him. He denies any associated headache or back pain. He denies any dysuria or hematuria or urinary  incontinence or retention. He denies any saddle anesthesia. He states that prior to yesterday he was feeling like his normal self without any recent fevers, nausea, vomiting or diarrhea.   ED work up initially was negative AM of 12/17.  Plan was for patient to get MRI of head, L spine, and T spine given BLE weakness during day on 12/17, had to be done during day due to brain stimulator needing to be re-programed to MRI compatible mode.   Apparently however, when they tried to get MRI: "Per MRI, unable to complete scan due to internal device battery being dead. Pt returned to room for device to charge and MRI will now have to be completed at 6am tomorrow morning."   Hospital Course by Problem:    CVA (cerebral vascular accident) [facility]) -MRI brain showed subacute CVA of left posterior frontal lobe. Pt was taking ASA/Plavix.  TCD with bubble study showed NO significant right to left shunting. Pt has bilateral LE DVT. Discussed with neurology Dr. Pearlean Brownie. Will treat his recurrent CVA with Eliquis alone. No antiplatelets(ie. Asa or plavix). Since this is his 2nd DVT, he will need lifelong anticoagulation   Leg DVT (deep venous thromboembolism), acute, bilateral (HCC) - Eliquis DVT regimen. Pt will need lifelong anticoagulation given that this is his 2nd DVT unprovoked.   Weakness -SNF bed   PFO (patent foramen ovale) - TCD with bubble yesterday  showed no significant right to left shunting.  Dr. Pearlean Brownie does not think pt is a candidate for PFO closure. Pt will need to remain on life long systemic anticoagulation due to recurrent DVTs.   Essential tremor - Resume deep brain stimulator treatment once this is charged. Pt states this has been less and less effective since he had a CVA in the area where his deep brain stimulator is attached.     CKD stage 3b, GFR 30-44 ml/min (HCC) - baseline SCr 2.0 -stable   History of pulmonary embolism - pt with bilateral LE DVT. He will need lifelong systemic  anticoagulation--- eliquis   Overweight (BMI 25.0-29.9) Estimated body mass index is 29.58 kg/m as calculated from the following:   Height as of this encounter: 6\' 4"  (1.93 m).   Weight as of this encounter: 110.2 kg.    Hyperlipidemia -resume home meds   Essential hypertension -stable  OSA on CPAP Cont CPAP per home settings   Diabetes mellitus type 2 in obese (HCC) -SSI -resume home meds at a lower dose              Medical Consultants:    neurology  Discharge Exam:   Vitals:   11/16/23 0434 11/16/23 0813  BP: 136/87 120/78  Pulse: 87 88  Resp: 18 18  Temp: 97.7 F (36.5 C) 97.7 F (36.5 C)  SpO2: 97% 96%   Vitals:   11/15/23 2340 11/16/23 0434 11/16/23 0434 11/16/23 0813  BP:  136/87 136/87 120/78  Pulse: 84 87 87 88  Resp: 18 18 18 18   Temp:  97.7 F (36.5 C) 97.7 F (36.5 C) 97.7 F (36.5 C)  TempSrc:  Oral Oral   SpO2:  97% 97% 96%  Weight:      Height:        General exam: Appears calm and comfortable.    The results of significant diagnostics from this hospitalization (including imaging, microbiology, ancillary and laboratory) are listed below for reference.     Procedures and Diagnostic Studies:   CT Head Wo Contrast Result Date: 11/09/2023 CLINICAL DATA:  Weakness, stroke suspected EXAM: CT HEAD WITHOUT CONTRAST TECHNIQUE: Contiguous axial images were obtained from the base of the skull through the vertex without intravenous contrast. RADIATION DOSE REDUCTION: This exam was performed according to the departmental dose-optimization program which includes automated exposure control, adjustment of the mA and/or kV according to patient size and/or use of iterative reconstruction technique. COMPARISON:  07/30/2023 CT head FINDINGS: Brain: No evidence of acute infarction, hemorrhage, mass, mass effect, or midline shift. No hydrocephalus or extra-axial fluid collection. Redemonstrated left-sided DBS electrode foot, in unchanged position. Remote  lacunar infarct in the right basal ganglia. Periventricular white matter changes, likely the sequela of chronic small vessel ischemic disease. Vascular: No hyperdense vessel. Skull: Negative for fracture or focal lesion. Left parietal burr hole. Sinuses/Orbits: No acute finding. Redemonstrated retained metallic density anterior to the left globe. Other: The mastoid air cells are well aerated. IMPRESSION: No acute intracranial process. Electronically Signed   By: Wiliam Ke M.D.   On: 11/09/2023 18:10     Labs:   Basic Metabolic Panel: Recent Labs  Lab 11/09/23 1406 11/12/23 0705  NA 142 139  K 4.2 3.7  CL 109 109  CO2 23 24  GLUCOSE 90 169*  BUN 38* 37*  CREATININE 2.02* 1.99*  CALCIUM 9.3 8.6*   GFR Estimated Creatinine Clearance: 44.3 mL/min (A) (by C-G formula based on SCr of 1.99 mg/dL (H)).  Liver Function Tests: Recent Labs  Lab 11/09/23 1406 11/12/23 0705  AST 35 31  ALT 30 28  ALKPHOS 101 87  BILITOT 0.7 0.5  PROT 6.6 5.8*  ALBUMIN 3.3* 2.8*   No results for input(s): "LIPASE", "AMYLASE" in the last 168 hours. No results for input(s): "AMMONIA" in the last 168 hours. Coagulation profile No results for input(s): "INR", "PROTIME" in the last 168 hours.  CBC: Recent Labs  Lab 11/09/23 1406  WBC 7.1  NEUTROABS 3.8  HGB 11.5*  HCT 35.9*  MCV 92.1  PLT 276   Cardiac Enzymes: No results for input(s): "CKTOTAL", "CKMB", "CKMBINDEX", "TROPONINI" in the last 168 hours. BNP: Invalid input(s): "POCBNP" CBG: Recent Labs  Lab 11/15/23 1307 11/15/23 1827 11/15/23 1957 11/16/23 0742 11/16/23 1120  GLUCAP 198* 131* 149* 188* 188*   D-Dimer No results for input(s): "DDIMER" in the last 72 hours. Hgb A1c No results for input(s): "HGBA1C" in the last 72 hours. Lipid Profile No results for input(s): "CHOL", "HDL", "LDLCALC", "TRIG", "CHOLHDL", "LDLDIRECT" in the last 72 hours. Thyroid function studies No results for input(s): "TSH", "T4TOTAL", "T3FREE",  "THYROIDAB" in the last 72 hours.  Invalid input(s): "FREET3" Anemia work up No results for input(s): "VITAMINB12", "FOLATE", "FERRITIN", "TIBC", "IRON", "RETICCTPCT" in the last 72 hours. Microbiology No results found for this or any previous visit (from the past 240 hours).   Discharge Instructions:   Discharge Instructions     Diet - low sodium heart healthy   Complete by: As directed    Diet Carb Modified   Complete by: As directed    Increase activity slowly   Complete by: As directed       Allergies as of 11/16/2023       Reactions   Melatonin Other (See Comments)   dizzy        Medication List     STOP taking these medications    aspirin EC 81 MG tablet   clopidogrel 75 MG tablet Commonly known as: Plavix   Mitigare 0.6 MG Caps Generic drug: Colchicine       TAKE these medications    amLODipine 2.5 MG tablet Commonly known as: NORVASC Take 1 tablet (2.5 mg total) by mouth daily. What changed:  medication strength See the new instructions. Another medication with the same name was removed. Continue taking this medication, and follow the directions you see here.   Apixaban Starter Pack (10mg  and 5mg ) Commonly known as: ELIQUIS STARTER PACK Take as directed on package: start with two-5mg  tablets twice daily for 7 days. On day 8, switch to one-5mg  tablet twice daily.   atorvastatin 40 MG tablet Commonly known as: LIPITOR Take 1 tablet (40 mg total) by mouth daily at 6 PM. What changed:  medication strength how much to take   cycloSPORINE 0.05 % ophthalmic emulsion Commonly known as: RESTASIS Apply 1 drop to eye 2 (two) times daily.   DULoxetine 60 MG capsule Commonly known as: CYMBALTA Take 120 mg by mouth daily.   ezetimibe 10 MG tablet Commonly known as: ZETIA TAKE ONE TABLET BY MOUTH ONCE DAILY.   fenofibrate 54 MG tablet Take 54 mg by mouth daily.   finasteride 5 MG tablet Commonly known as: PROSCAR Take 1 tablet (5 mg total)  by mouth daily. What changed: when to take this   FreeStyle Libre 2 Sensor Misc See admin instructions.   insulin aspart 100 UNIT/ML injection Commonly known as: novoLOG Inject 0-15 Units into the skin 3 (three) times  daily with meals.   losartan 100 MG tablet Commonly known as: COZAAR Take 1 tablet by mouth daily.   metFORMIN 500 MG tablet Commonly known as: GLUCOPHAGE Take 500 mg by mouth 2 (two) times daily with a meal.   metoprolol succinate 50 MG 24 hr tablet Commonly known as: TOPROL-XL Take 1 tablet (50 mg total) by mouth daily. What changed: when to take this   mirtazapine 7.5 MG tablet Commonly known as: REMERON Take 7.5 mg by mouth at bedtime.   multivitamin with minerals Tabs tablet Take 1 tablet by mouth daily.   mupirocin ointment 2 % Commonly known as: BACTROBAN Apply 1 Application topically 2 (two) times daily.   polyethylene glycol 17 g packet Commonly known as: MIRALAX / GLYCOLAX Take 17 g by mouth daily.   PRESCRIPTION MEDICATION Inhale into the lungs at bedtime. CPAP   SYSTANE OP Place 1-2 drops into both eyes 4 (four) times daily as needed (dry eyes).   tamsulosin 0.4 MG Caps capsule Commonly known as: FLOMAX Take 1 capsule (0.4 mg total) by mouth daily.   Toujeo SoloStar 300 UNIT/ML Solostar Pen Generic drug: insulin glargine (1 Unit Dial) Inject 35 Units into the skin daily. What changed:  how much to take when to take this        Contact information for follow-up providers     [facility] Follow up.   Specialty: PALLIATIVE CARE Why: [facility] will call you are follow up regarding Palliative Care Services. Contact information: [address] [place] [address] [phone]        Little Ishikawa, MD Follow up in 1 month(s).   Specialties: Cardiology, Radiology Why: Follow up 12/17/2023 Contact information: [address] [place]  [address] [phone]              Contact information for after-discharge care     Destination     [facility] Preferred SNF .   Service: Skilled Nursing Contact information: [address] [place] [address] [phone]                      Time coordinating discharge: 45 min  Signed:  Joseph Art DO  [facility] 11/16/2023, 11:40 AM

## 2023-11-17 DIAGNOSIS — M6281 Muscle weakness (generalized): Secondary | ICD-10-CM | POA: Diagnosis not present

## 2023-11-17 DIAGNOSIS — F330 Major depressive disorder, recurrent, mild: Secondary | ICD-10-CM | POA: Diagnosis not present

## 2023-11-17 DIAGNOSIS — I639 Cerebral infarction, unspecified: Secondary | ICD-10-CM | POA: Diagnosis not present

## 2023-11-19 DIAGNOSIS — I1310 Hypertensive heart and chronic kidney disease without heart failure, with stage 1 through stage 4 chronic kidney disease, or unspecified chronic kidney disease: Secondary | ICD-10-CM | POA: Diagnosis not present

## 2023-11-19 DIAGNOSIS — I639 Cerebral infarction, unspecified: Secondary | ICD-10-CM | POA: Diagnosis not present

## 2023-11-19 DIAGNOSIS — Z7901 Long term (current) use of anticoagulants: Secondary | ICD-10-CM | POA: Diagnosis not present

## 2023-11-19 DIAGNOSIS — G250 Essential tremor: Secondary | ICD-10-CM | POA: Diagnosis not present

## 2023-11-19 DIAGNOSIS — E119 Type 2 diabetes mellitus without complications: Secondary | ICD-10-CM | POA: Diagnosis not present

## 2023-11-19 DIAGNOSIS — I82509 Chronic embolism and thrombosis of unspecified deep veins of unspecified lower extremity: Secondary | ICD-10-CM | POA: Diagnosis not present

## 2023-11-20 ENCOUNTER — Ambulatory Visit: Payer: Medicare PPO | Admitting: Physical Therapy

## 2023-11-23 DIAGNOSIS — I1310 Hypertensive heart and chronic kidney disease without heart failure, with stage 1 through stage 4 chronic kidney disease, or unspecified chronic kidney disease: Secondary | ICD-10-CM | POA: Diagnosis not present

## 2023-11-23 DIAGNOSIS — Z7901 Long term (current) use of anticoagulants: Secondary | ICD-10-CM | POA: Diagnosis not present

## 2023-11-23 DIAGNOSIS — I82509 Chronic embolism and thrombosis of unspecified deep veins of unspecified lower extremity: Secondary | ICD-10-CM | POA: Diagnosis not present

## 2023-11-23 DIAGNOSIS — G250 Essential tremor: Secondary | ICD-10-CM | POA: Diagnosis not present

## 2023-11-23 DIAGNOSIS — E119 Type 2 diabetes mellitus without complications: Secondary | ICD-10-CM | POA: Diagnosis not present

## 2023-11-23 DIAGNOSIS — I639 Cerebral infarction, unspecified: Secondary | ICD-10-CM | POA: Diagnosis not present

## 2023-11-24 DIAGNOSIS — E119 Type 2 diabetes mellitus without complications: Secondary | ICD-10-CM | POA: Diagnosis not present

## 2023-11-24 DIAGNOSIS — F330 Major depressive disorder, recurrent, mild: Secondary | ICD-10-CM | POA: Diagnosis not present

## 2023-11-24 DIAGNOSIS — I639 Cerebral infarction, unspecified: Secondary | ICD-10-CM | POA: Diagnosis not present

## 2023-11-24 DIAGNOSIS — G250 Essential tremor: Secondary | ICD-10-CM | POA: Diagnosis not present

## 2023-11-24 DIAGNOSIS — M6281 Muscle weakness (generalized): Secondary | ICD-10-CM | POA: Diagnosis not present

## 2023-11-24 DIAGNOSIS — Z7901 Long term (current) use of anticoagulants: Secondary | ICD-10-CM | POA: Diagnosis not present

## 2023-11-24 DIAGNOSIS — I1310 Hypertensive heart and chronic kidney disease without heart failure, with stage 1 through stage 4 chronic kidney disease, or unspecified chronic kidney disease: Secondary | ICD-10-CM | POA: Diagnosis not present

## 2023-11-24 DIAGNOSIS — I82509 Chronic embolism and thrombosis of unspecified deep veins of unspecified lower extremity: Secondary | ICD-10-CM | POA: Diagnosis not present

## 2023-11-25 DIAGNOSIS — I82509 Chronic embolism and thrombosis of unspecified deep veins of unspecified lower extremity: Secondary | ICD-10-CM | POA: Diagnosis not present

## 2023-11-25 DIAGNOSIS — I639 Cerebral infarction, unspecified: Secondary | ICD-10-CM | POA: Diagnosis not present

## 2023-11-25 DIAGNOSIS — K5909 Other constipation: Secondary | ICD-10-CM | POA: Diagnosis not present

## 2023-11-25 DIAGNOSIS — E119 Type 2 diabetes mellitus without complications: Secondary | ICD-10-CM | POA: Diagnosis not present

## 2023-11-25 DIAGNOSIS — I1310 Hypertensive heart and chronic kidney disease without heart failure, with stage 1 through stage 4 chronic kidney disease, or unspecified chronic kidney disease: Secondary | ICD-10-CM | POA: Diagnosis not present

## 2023-11-25 DIAGNOSIS — G250 Essential tremor: Secondary | ICD-10-CM | POA: Diagnosis not present

## 2023-11-25 DIAGNOSIS — Z7901 Long term (current) use of anticoagulants: Secondary | ICD-10-CM | POA: Diagnosis not present

## 2023-11-26 DIAGNOSIS — Z7901 Long term (current) use of anticoagulants: Secondary | ICD-10-CM | POA: Diagnosis not present

## 2023-11-26 DIAGNOSIS — E119 Type 2 diabetes mellitus without complications: Secondary | ICD-10-CM | POA: Diagnosis not present

## 2023-11-26 DIAGNOSIS — I82509 Chronic embolism and thrombosis of unspecified deep veins of unspecified lower extremity: Secondary | ICD-10-CM | POA: Diagnosis not present

## 2023-11-26 DIAGNOSIS — I639 Cerebral infarction, unspecified: Secondary | ICD-10-CM | POA: Diagnosis not present

## 2023-11-26 DIAGNOSIS — K5909 Other constipation: Secondary | ICD-10-CM | POA: Diagnosis not present

## 2023-11-26 DIAGNOSIS — G250 Essential tremor: Secondary | ICD-10-CM | POA: Diagnosis not present

## 2023-11-26 DIAGNOSIS — I1310 Hypertensive heart and chronic kidney disease without heart failure, with stage 1 through stage 4 chronic kidney disease, or unspecified chronic kidney disease: Secondary | ICD-10-CM | POA: Diagnosis not present

## 2023-11-27 DIAGNOSIS — I1310 Hypertensive heart and chronic kidney disease without heart failure, with stage 1 through stage 4 chronic kidney disease, or unspecified chronic kidney disease: Secondary | ICD-10-CM | POA: Diagnosis not present

## 2023-11-27 DIAGNOSIS — F330 Major depressive disorder, recurrent, mild: Secondary | ICD-10-CM | POA: Diagnosis not present

## 2023-11-27 DIAGNOSIS — E119 Type 2 diabetes mellitus without complications: Secondary | ICD-10-CM | POA: Diagnosis not present

## 2023-11-27 DIAGNOSIS — I82509 Chronic embolism and thrombosis of unspecified deep veins of unspecified lower extremity: Secondary | ICD-10-CM | POA: Diagnosis not present

## 2023-11-27 DIAGNOSIS — I639 Cerebral infarction, unspecified: Secondary | ICD-10-CM | POA: Diagnosis not present

## 2023-11-27 DIAGNOSIS — Z7901 Long term (current) use of anticoagulants: Secondary | ICD-10-CM | POA: Diagnosis not present

## 2023-11-27 DIAGNOSIS — G250 Essential tremor: Secondary | ICD-10-CM | POA: Diagnosis not present

## 2023-11-27 DIAGNOSIS — M6281 Muscle weakness (generalized): Secondary | ICD-10-CM | POA: Diagnosis not present

## 2023-11-30 DIAGNOSIS — Z7901 Long term (current) use of anticoagulants: Secondary | ICD-10-CM | POA: Diagnosis not present

## 2023-11-30 DIAGNOSIS — E119 Type 2 diabetes mellitus without complications: Secondary | ICD-10-CM | POA: Diagnosis not present

## 2023-11-30 DIAGNOSIS — G250 Essential tremor: Secondary | ICD-10-CM | POA: Diagnosis not present

## 2023-11-30 DIAGNOSIS — I639 Cerebral infarction, unspecified: Secondary | ICD-10-CM | POA: Diagnosis not present

## 2023-11-30 DIAGNOSIS — I1310 Hypertensive heart and chronic kidney disease without heart failure, with stage 1 through stage 4 chronic kidney disease, or unspecified chronic kidney disease: Secondary | ICD-10-CM | POA: Diagnosis not present

## 2023-11-30 DIAGNOSIS — I82509 Chronic embolism and thrombosis of unspecified deep veins of unspecified lower extremity: Secondary | ICD-10-CM | POA: Diagnosis not present

## 2023-11-30 DIAGNOSIS — R051 Acute cough: Secondary | ICD-10-CM | POA: Diagnosis not present

## 2023-12-01 DIAGNOSIS — E119 Type 2 diabetes mellitus without complications: Secondary | ICD-10-CM | POA: Diagnosis not present

## 2023-12-01 DIAGNOSIS — M6281 Muscle weakness (generalized): Secondary | ICD-10-CM | POA: Diagnosis not present

## 2023-12-01 DIAGNOSIS — J069 Acute upper respiratory infection, unspecified: Secondary | ICD-10-CM | POA: Diagnosis not present

## 2023-12-01 DIAGNOSIS — F330 Major depressive disorder, recurrent, mild: Secondary | ICD-10-CM | POA: Diagnosis not present

## 2023-12-01 DIAGNOSIS — I639 Cerebral infarction, unspecified: Secondary | ICD-10-CM | POA: Diagnosis not present

## 2023-12-01 DIAGNOSIS — Z1159 Encounter for screening for other viral diseases: Secondary | ICD-10-CM | POA: Diagnosis not present

## 2023-12-02 NOTE — Therapy (Signed)
 [facility] [address] [place], [place], [address] Phone: [phone]   Fax:  [phone]  Patient Details  Name: Joshua [name] MRN: [id number] Date of Birth: 07-30-1951 Referring Provider:  No ref. provider found  Encounter Date: 12/02/2023  PHYSICAL THERAPY DISCHARGE SUMMARY  Visits from Start of Care: 4  Current functional level related to goals / functional outcomes: See last encounter   Remaining deficits: Fall risk, decreased balance, decreased endurance, decreased strength   Education / Equipment: PT POC, HEP, use of AFOs   Patient agrees to discharge. Patient goals were  unable to be assessed as patient did not return since last visit . Patient is being discharged due to not returning since the last visit.  Delon DELENA Pop, PT Delon DELENA Pop, PT, DPT, CBIS  12/02/2023, 10:50 AM  [facility] [address] [place], [place], [address] Phone: [phone]   Fax:  [phone]

## 2023-12-04 ENCOUNTER — Telehealth: Payer: Self-pay | Admitting: Cardiology

## 2023-12-04 DIAGNOSIS — R278 Other lack of coordination: Secondary | ICD-10-CM | POA: Diagnosis not present

## 2023-12-04 DIAGNOSIS — R1312 Dysphagia, oropharyngeal phase: Secondary | ICD-10-CM | POA: Diagnosis not present

## 2023-12-04 DIAGNOSIS — M6281 Muscle weakness (generalized): Secondary | ICD-10-CM | POA: Diagnosis not present

## 2023-12-04 DIAGNOSIS — M6259 Muscle wasting and atrophy, not elsewhere classified, multiple sites: Secondary | ICD-10-CM | POA: Diagnosis not present

## 2023-12-04 DIAGNOSIS — E1121 Type 2 diabetes mellitus with diabetic nephropathy: Secondary | ICD-10-CM | POA: Diagnosis not present

## 2023-12-04 DIAGNOSIS — I639 Cerebral infarction, unspecified: Secondary | ICD-10-CM | POA: Diagnosis not present

## 2023-12-04 DIAGNOSIS — I82403 Acute embolism and thrombosis of unspecified deep veins of lower extremity, bilateral: Secondary | ICD-10-CM | POA: Diagnosis not present

## 2023-12-04 NOTE — Telephone Encounter (Signed)
 Pt has a [employer] mobile cardiac telemetry box and wife is not sure what to do with it. She would like a call back.

## 2023-12-06 DIAGNOSIS — R278 Other lack of coordination: Secondary | ICD-10-CM | POA: Diagnosis not present

## 2023-12-06 DIAGNOSIS — I82403 Acute embolism and thrombosis of unspecified deep veins of lower extremity, bilateral: Secondary | ICD-10-CM | POA: Diagnosis not present

## 2023-12-06 DIAGNOSIS — I639 Cerebral infarction, unspecified: Secondary | ICD-10-CM | POA: Diagnosis not present

## 2023-12-06 DIAGNOSIS — M6281 Muscle weakness (generalized): Secondary | ICD-10-CM | POA: Diagnosis not present

## 2023-12-06 DIAGNOSIS — R1312 Dysphagia, oropharyngeal phase: Secondary | ICD-10-CM | POA: Diagnosis not present

## 2023-12-06 DIAGNOSIS — M6259 Muscle wasting and atrophy, not elsewhere classified, multiple sites: Secondary | ICD-10-CM | POA: Diagnosis not present

## 2023-12-07 DIAGNOSIS — R0989 Other specified symptoms and signs involving the circulatory and respiratory systems: Secondary | ICD-10-CM | POA: Diagnosis not present

## 2023-12-07 DIAGNOSIS — R1312 Dysphagia, oropharyngeal phase: Secondary | ICD-10-CM | POA: Diagnosis not present

## 2023-12-07 DIAGNOSIS — R278 Other lack of coordination: Secondary | ICD-10-CM | POA: Diagnosis not present

## 2023-12-07 DIAGNOSIS — M6281 Muscle weakness (generalized): Secondary | ICD-10-CM | POA: Diagnosis not present

## 2023-12-07 DIAGNOSIS — M6259 Muscle wasting and atrophy, not elsewhere classified, multiple sites: Secondary | ICD-10-CM | POA: Diagnosis not present

## 2023-12-07 DIAGNOSIS — I639 Cerebral infarction, unspecified: Secondary | ICD-10-CM | POA: Diagnosis not present

## 2023-12-07 DIAGNOSIS — I82403 Acute embolism and thrombosis of unspecified deep veins of lower extremity, bilateral: Secondary | ICD-10-CM | POA: Diagnosis not present

## 2023-12-07 DIAGNOSIS — R059 Cough, unspecified: Secondary | ICD-10-CM | POA: Diagnosis not present

## 2023-12-07 NOTE — Telephone Encounter (Signed)
 Received a message that you may need assistance applying your [employer] event monitor.  There are instructions in box 2 of your kit.  I also sent a letter on your mychart which would walk you through the process. If you would be more comfortable having  your monitor applied in the office and be given a brief tutorial, please call me to set up an appointment.   [facility] office. [phone] [address],  [place], [place]

## 2023-12-07 NOTE — Telephone Encounter (Signed)
 This appears to be ordered by  Thomasene Ripple. Cleaver NP-C on 11/12/23. Fabiola Backer LPM appears to have ordered for provider. Will forward to their office for follow up with patient.   We do not manage this devices in [facility].  Thanks.

## 2023-12-08 ENCOUNTER — Encounter (HOSPITAL_BASED_OUTPATIENT_CLINIC_OR_DEPARTMENT_OTHER): Payer: Self-pay | Admitting: Pulmonary Disease

## 2023-12-08 ENCOUNTER — Ambulatory Visit (HOSPITAL_BASED_OUTPATIENT_CLINIC_OR_DEPARTMENT_OTHER): Payer: Medicare PPO | Admitting: Pulmonary Disease

## 2023-12-09 DIAGNOSIS — R278 Other lack of coordination: Secondary | ICD-10-CM | POA: Diagnosis not present

## 2023-12-09 DIAGNOSIS — I82403 Acute embolism and thrombosis of unspecified deep veins of lower extremity, bilateral: Secondary | ICD-10-CM | POA: Diagnosis not present

## 2023-12-09 DIAGNOSIS — R1312 Dysphagia, oropharyngeal phase: Secondary | ICD-10-CM | POA: Diagnosis not present

## 2023-12-09 DIAGNOSIS — I639 Cerebral infarction, unspecified: Secondary | ICD-10-CM | POA: Diagnosis not present

## 2023-12-09 DIAGNOSIS — M6259 Muscle wasting and atrophy, not elsewhere classified, multiple sites: Secondary | ICD-10-CM | POA: Diagnosis not present

## 2023-12-09 DIAGNOSIS — M6281 Muscle weakness (generalized): Secondary | ICD-10-CM | POA: Diagnosis not present

## 2023-12-10 DIAGNOSIS — Z7901 Long term (current) use of anticoagulants: Secondary | ICD-10-CM | POA: Diagnosis not present

## 2023-12-10 DIAGNOSIS — I639 Cerebral infarction, unspecified: Secondary | ICD-10-CM | POA: Diagnosis not present

## 2023-12-10 DIAGNOSIS — Z1159 Encounter for screening for other viral diseases: Secondary | ICD-10-CM | POA: Diagnosis not present

## 2023-12-10 DIAGNOSIS — I82509 Chronic embolism and thrombosis of unspecified deep veins of unspecified lower extremity: Secondary | ICD-10-CM | POA: Diagnosis not present

## 2023-12-10 DIAGNOSIS — I1310 Hypertensive heart and chronic kidney disease without heart failure, with stage 1 through stage 4 chronic kidney disease, or unspecified chronic kidney disease: Secondary | ICD-10-CM | POA: Diagnosis not present

## 2023-12-10 DIAGNOSIS — E119 Type 2 diabetes mellitus without complications: Secondary | ICD-10-CM | POA: Diagnosis not present

## 2023-12-10 DIAGNOSIS — G250 Essential tremor: Secondary | ICD-10-CM | POA: Diagnosis not present

## 2023-12-11 DIAGNOSIS — G4733 Obstructive sleep apnea (adult) (pediatric): Secondary | ICD-10-CM | POA: Diagnosis not present

## 2023-12-11 DIAGNOSIS — I82509 Chronic embolism and thrombosis of unspecified deep veins of unspecified lower extremity: Secondary | ICD-10-CM | POA: Diagnosis not present

## 2023-12-11 DIAGNOSIS — I2699 Other pulmonary embolism without acute cor pulmonale: Secondary | ICD-10-CM | POA: Diagnosis not present

## 2023-12-11 DIAGNOSIS — R29898 Other symptoms and signs involving the musculoskeletal system: Secondary | ICD-10-CM | POA: Diagnosis not present

## 2023-12-11 DIAGNOSIS — I69398 Other sequelae of cerebral infarction: Secondary | ICD-10-CM | POA: Diagnosis not present

## 2023-12-11 DIAGNOSIS — I1310 Hypertensive heart and chronic kidney disease without heart failure, with stage 1 through stage 4 chronic kidney disease, or unspecified chronic kidney disease: Secondary | ICD-10-CM | POA: Diagnosis not present

## 2023-12-11 DIAGNOSIS — Z794 Long term (current) use of insulin: Secondary | ICD-10-CM | POA: Diagnosis not present

## 2023-12-11 DIAGNOSIS — I472 Ventricular tachycardia, unspecified: Secondary | ICD-10-CM | POA: Diagnosis not present

## 2023-12-11 DIAGNOSIS — E1122 Type 2 diabetes mellitus with diabetic chronic kidney disease: Secondary | ICD-10-CM | POA: Diagnosis not present

## 2023-12-11 DIAGNOSIS — N1832 Chronic kidney disease, stage 3b: Secondary | ICD-10-CM | POA: Diagnosis not present

## 2023-12-13 NOTE — Progress Notes (Unsigned)
Cardiology Office Note:    Date:  12/17/2023   ID:  Joshua [name], DOB [birth year], MRN [id number]  PCP:  Creola Corn, MD  Cardiologist:  Little Ishikawa, MD  Electrophysiologist:  None   Referring MD: Creola Corn, MD   Chief Complaint  Patient presents with   Cardiomyopathy    History of Present Illness:     Joshua [name] is a 73 y.o. male with a hx of HCM, hypertension, diabetes, OSA, PE after knee surgery, essential tremor status post DBS June 2020, CVA who presents for follow-up.  Admitted to [facility] from 08/28/19 through 08/31/19 with an acute CVA.  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  MRI brain showed right internal capsule infarct.  Echocardiogram showed no cardiogenic source of embolism.  He was started on heparin given subclavian artery thrombosis, and this was transitioned to Eliquis.  It was unclear if the thrombosis was due to a cardiac source or developed in situ.  Stroke team recommended Eliquis for 2 months and repeating CTA neck; if CTA neck negative and no atrial fibrillation noted on 30-day monitor, stroke team recommended stopping Eliquis and resuming antiplatelet agent.  TTE was notable for an incidental finding of asymmetric basal septal hypertrophy.  He was referred to cardiology and seen on 09/19/19.  Cardiac MRI was ordered, which showed Basal septal hypertrophy measuring up to 20mm (lateral wall 12mm), consistent with hypertrophic cardiomyopathy.  PYP scan showed no evidence of amyloid.  Cardiac monitor showed no VT, occasional PVCs (1.3% of beats).   Repeat monitor on 08/22/2020 showed 1 4 beat run of NSVT, 6 runs of SVT longest lasting 16 beats, occasional PVCs (4%).  He denies any family history of HCM.  Does have vertigo.  No syncope except with CVA years ago.  He was admitted to [facility] from 3/8 through 02/02/2021 with shortness of breath and elevated effort for cardiac fusion.  Started on  colchicine and Eliquis was held.  No adequate window for pericardiocentesis.  Repeat echo 03/08/2021 showed pericardial effusion had resolved, LVEF 55 to 60%, grade 1 diastolic dysfunction, moderate LVH, strain abnormalities suggestive of cardiac amyloidosis.  Lexiscan Myoview on 02/12/2021 showed no evidence of ischemia, EF 53%.  Lexiscan Myoview 06/2023 showed normal perfusion, EF 45%.  He was admitted 10/2023 with CVA and lower extremity DVT.  His anticoagulation had been discontinued and he was on aspirin, Plavix.  He was switched to Eliquis.  Lifelong anticoagulation recommended.  TEE 11/13/2023 showed small PFO, EF 60 to 65%, normal RV function, mild aortic stenosis.  Since discharge from the hospital, he reports he is doing okay.  He denies any chest pain but does report he has been having dyspnea with minimal exertion.  He continues to feel lightheaded but denies any syncope.  Denies any palpitations or lower extremity edema.   Wt Readings from Last 3 Encounters:  12/17/23 224 lb (101.6 kg)  11/10/23 243 lb (110.2 kg)  11/03/23 247 lb (112 kg)    Past Medical History:  Diagnosis Date   Aphasia 09/01/2019   Benign essential tremor    Benign positional vertigo    Cerebral embolism with cerebral infarction 08/29/2019   Chronic kidney disease 08/28/2019   CVA (cerebral vascular accident) (HCC)    x2 - L retina, 1 right parietal   Degenerative arthritis    Depression    Diabetes mellitus    DVT (deep venous thrombosis) (HCC) 2018   Dyslipidemia  GERD (gastroesophageal reflux disease)    hiatal hernia   Gout    H/O: vasectomy    Hearing aid worn    b/l   Hx of appendectomy    Hx of tonsillectomy    Hypertension    Hypertrophic cardiomyopathy (HCC)    Ischemic optic neuropathy    on the left   Melanoma (HCC)    NSVT (nonsustained ventricular tachycardia) (HCC)    1 4 beat run on event monitor in 07/2020   Obesity    OSA on CPAP    setting = 5   Pulmonary emboli (HCC) 2018    PVC's (premature ventricular contractions)    SVT (supraventricular tachycardia) (HCC)    by event monitor   Tremor, essential 06/22/2017   Wears glasses     Past Surgical History:  Procedure Laterality Date   APPENDECTOMY     arthroscopic knee surgery Bilateral    CATARACT EXTRACTION Bilateral    COLONOSCOPY     MINOR PLACEMENT OF FIDUCIAL N/A 06/30/2019   Procedure: Fiducial placement;  Surgeon: Maeola Harman, MD;  Location: [facility] OR;  Service: Neurosurgery;  Laterality: N/A;  Fiducial placement   NASAL SEPTUM SURGERY     PULSE GENERATOR IMPLANT N/A 07/14/2019   Procedure: Left cranial Implanted Pulse Generator and lead extension placement to right chest ;  Surgeon: Maeola Harman, MD;  Location: [facility] OR;  Service: Neurosurgery;  Laterality: N/A;   SUBTHALAMIC STIMULATOR INSERTION Left 07/07/2019   Procedure: LEFT DEEP BRAIN STIMULATOR PLACEMENT;  Surgeon: Maeola Harman, MD;  Location: [facility] OR;  Service: Neurosurgery;  Laterality: Left;   TEE WITHOUT CARDIOVERSION N/A 04/23/2021   Procedure: TRANSESOPHAGEAL ECHOCARDIOGRAM (TEE);  Surgeon: Elease Hashimoto Deloris Ping, MD;  Location: [facility] ENDOSCOPY;  Service: Cardiovascular;  Laterality: N/A;   TONSILLECTOMY     TOTAL KNEE ARTHROPLASTY Left 03/30/2017   Procedure: LEFT TOTAL KNEE ARTHROPLASTY;  Surgeon: Durene Romans, MD;  Location: [facility] ORS;  Service: Orthopedics;  Laterality: Left;   TRANSESOPHAGEAL ECHOCARDIOGRAM (CATH LAB) N/A 11/13/2023   Procedure: TRANSESOPHAGEAL ECHOCARDIOGRAM;  Surgeon: Wendall Stade, MD;  Location: [facility] INVASIVE CV LAB;  Service: Cardiovascular;  Laterality: N/A;   WISDOM TOOTH EXTRACTION      Current Medications: Current Meds  Medication Sig   amLODipine (NORVASC) 2.5 MG tablet Take 1 tablet (2.5 mg total) by mouth daily.   apixaban (ELIQUIS) 5 MG TABS tablet Take 1 tablet (5 mg total) by mouth 2 (two) times daily.   atorvastatin (LIPITOR) 40 MG tablet Take 1 tablet (40 mg total) by mouth daily at 6 PM.   Continuous Blood  Gluc Sensor (FREESTYLE LIBRE 2 SENSOR) MISC See admin instructions.   cycloSPORINE (RESTASIS) 0.05 % ophthalmic emulsion Apply 1 drop to eye 2 (two) times daily.   DULoxetine (CYMBALTA) 60 MG capsule Take 120 mg by mouth daily.   ezetimibe (ZETIA) 10 MG tablet TAKE ONE TABLET BY MOUTH ONCE DAILY.   fenofibrate 54 MG tablet Take 54 mg by mouth daily.   finasteride (PROSCAR) 5 MG tablet Take 1 tablet (5 mg total) by mouth daily. (Patient taking differently: Take 5 mg by mouth at bedtime.)   insulin aspart (NOVOLOG) 100 UNIT/ML injection Inject 0-15 Units into the skin 3 (three) times daily with meals.   insulin glargine, 1 Unit Dial, (TOUJEO SOLOSTAR) 300 UNIT/ML Solostar Pen Inject 35 Units into the skin daily.   losartan (COZAAR) 100 MG tablet Take 1 tablet by mouth daily.   metFORMIN (GLUCOPHAGE) 500 MG tablet Take 500 mg  by mouth 2 (two) times daily with a meal.   metoprolol succinate (TOPROL-XL) 50 MG 24 hr tablet Take 1 tablet (50 mg total) by mouth daily. (Patient taking differently: Take 50 mg by mouth at bedtime.)   mirtazapine (REMERON) 7.5 MG tablet Take 7.5 mg by mouth at bedtime.   Multiple Vitamin (MULTIVITAMIN WITH MINERALS) TABS tablet Take 1 tablet by mouth daily.   mupirocin ointment (BACTROBAN) 2 % Apply 1 Application topically 2 (two) times daily.   Polyethyl Glycol-Propyl Glycol (SYSTANE OP) Place 1-2 drops into both eyes 4 (four) times daily as needed (dry eyes).    polyethylene glycol (MIRALAX / GLYCOLAX) 17 g packet Take 17 g by mouth daily.   PRESCRIPTION MEDICATION Inhale into the lungs at bedtime. CPAP   tamsulosin (FLOMAX) 0.4 MG CAPS capsule Take 1 capsule (0.4 mg total) by mouth daily.   [DISCONTINUED] clopidogrel (PLAVIX) 75 MG tablet Take 75 mg by mouth daily.     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  Tobacco Use   Smoking status: Former    Current packs/day: 0.00    Average packs/day: 1 pack/day for 10.0 years (10.0 ttl pk-yrs)    Types: Cigarettes    Start date: 11/24/1973    Quit date: 11/25/1983    Years since quitting: 40.0   Smokeless tobacco: Never  Vaping Use   Vaping status: Never Used  Substance and Sexual Activity   Alcohol use: Not Currently    Alcohol/week: 2.0 standard drinks of alcohol    Types: 2 Standard drinks or equivalent per week    Comment: occassionally   Drug use: No   Sexual activity: Not on file  Other Topics Concern   Not on file  Social History Narrative   Patient lives at home with wife. Larita Fife(   Patient has 2 children that are in good health.    Patient works for [employer]. Retired .   Patient has a Bachelors degree in History.       Right handed    Lives in one story home - Handicap accessible    Social Drivers of Health   Financial Resource Strain: Not on file  Food Insecurity: No Food Insecurity (11/11/2023)   Hunger Vital Sign    Worried About Running Out of Food in the Last Year: Never true    Ran Out of Food in the Last Year: Never true  Transportation Needs: No Transportation Needs (11/11/2023)   PRAPARE - Administrator, Civil Service (Medical): No    Lack of Transportation (Non-Medical): No  Physical Activity: Not on file  Stress: Not on file  Social Connections: Not on file     Family History: The patient's family history includes Alzheimer's disease in his father; Cerebral aneurysm in his mother; Diabetes in his maternal grandmother; Healthy in his son; Tremor in his brother, maternal uncle, and mother. There is no history of Colon cancer.  ROS:   Please see the history of present illness.     All other systems reviewed and are  negative.  EKGs/Labs/Other Studies Reviewed:    The following studies were reviewed today:  EKG:   02/12/22: Sinus rhythm, first-degree AV block, rate 72, left axis deviation, poor R wave progression, Q waves in inferior leads 04/30/2021: sinus rhythm, first-degree AV block, rate 67, right axis deviation, Q waves in V1-3 03/26/2021: sinus rhythm, first-degree AV block, rate 92, left axis deviation, Q waves II, III, aVF, poor R wave progression  TEE 04/23/2021: 1. Left ventricular ejection fraction, by estimation, is 60 to 65%. The  left ventricle has normal function.   2. Right ventricular systolic function is normal. The right ventricular  size is normal.   3. No left atrial/left atrial appendage thrombus was detected.   4. Systolic anterior motion of the mitral valve leaflets is present . The  mitral valve is normal in structure. No evidence of mitral valve  regurgitation.   5. The aortic valve is normal in structure. Aortic valve regurgitation is  not visualized.   Echo 03/08/2021:  1. Although overall left ventricular GLS is normal, the distribution of  strain abnormalities is strongly suggestive of cardiac amyloidosis  ("cherry on top" pattern). Left ventricular ejection fraction, by  estimation, is 55 to 60%. The left ventricle has   normal function. The left ventricle has no regional wall motion  abnormalities. There is moderate concentric left ventricular hypertrophy.  Left ventricular diastolic parameters are consistent with Grade I  diastolic dysfunction (impaired relaxation). The  average left ventricular global longitudinal strain is -19.1 %. The global  longitudinal strain is normal.   2. Right ventricular systolic function is normal. The right ventricular  size is normal.   3. The mitral valve is normal in structure. No evidence of mitral valve  regurgitation.   4. The aortic valve is tricuspid. Aortic valve regurgitation is not  visualized. Mild to moderate aortic  valve sclerosis/calcification is  present, without any evidence of aortic stenosis.   5. Aortic dilatation noted. There is moderate dilatation of the ascending  aorta, measuring 45 mm.   Comparison(s): No significant change from prior study. Prior images  reviewed side by side. Pericardial effusion has resolved. Note GLS pattern  and marked LVH suggestive of cardiac amyloidosis, but also note normal  Tc17m-PYP scan. Consider evaluation for  light chain disease if not yet performed.  Lexiscan Myoview 02/12/2021: Nuclear stress EF: 52%. There was no ST segment deviation noted during stress. This is a low risk study with no evidence of ischemia. The left ventricular ejection fraction is mildly decreased (45-54%). Visually, systolic function appears normal.  Echo 01/30/2021: 1. Large pericardial effusion. The pericardial effusion is anterior to  the right ventricle. There is no evidence of increased pericardial  pressure. Echodensity within the effusion may suggest exudative nature.   2. Left ventricular ejection fraction, by estimation, is 55 to 60%. The  left ventricle has normal function. Left ventricular endocardial border  not optimally defined to evaluate regional wall motion. There is moderate  concentric left ventricular  hypertrophy. Left ventricular diastolic parameters are indeterminate.   3. Right ventricular systolic function was not well visualized. The right  ventricular size is not well visualized.   4. The mitral valve was not well visualized. No evidence of mitral valve  regurgitation.   5. The aortic valve was not well visualized. Aortic valve regurgitation  is not visualized. No aortic stenosis is present.   6. Aortic dilatation noted. There is mild dilatation of the aortic root,  measuring 40 mm.   7. The inferior vena cava is normal in size with <50% respiratory  variability, suggesting  right atrial pressure of 8 mmHg.   Comparison(s): A prior study was performed  on 08/29/2019. Prior images  reviewed side by side. Difficult comparsion- this study is much more  technically difficult. Significant increase in pericardial effusion.  Primary cardiology team made aware.  8 day Zio Monitor 08/22/2020: One 4 beat run of NSVT 6 runs of SVT, longest lasting 16 beats Occasional PVCs (3.9%) 8 days of data recorded on Zio monitor. Patient had a min HR of 62 bpm, max HR of 162 bpm, and avg HR of 86 bpm. Predominant underlying rhythm was Sinus Rhythm. No atrial fibrillation, high degree block, or pauses noted. One 4 beat run of NSVT.  6 runs of SVT, longest lasting 16 beats.  Isolated atrial ectopy was rare (<1%).  Isolated ventricular ectopy was occasional (3.9%).  There were 0 triggered events.    Cardiac MRI 10/07/19: 1. Limited study, as only a few sequences were able to be completed due to limitations from presence of deep brain stimulator 2. Basal septal hypertrophy measuring up to 20mm (lateral wall 12 mm), consistent with hypertrophic cardiomyopathy 3. Patchy late gadolinium enhancement in basal septum, consistent with HCM 4. Basal inferolateral midwall LGE, which would not be a typical pattern for HCM, as more commonly seen in setting of prior myocarditis or sarcoidosis. 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  Normal LV size with hyperdynamic systolic function (EF 69%) 6.  Normal RV size and systolic function (EF 61%)  TTE 08/29/19:  1. Technically difficult study. Left ventricular ejection fraction appears grossly normal, approximately 55-60%, though difficult visualization even with contrast  2. There is asymmetric basal septal hypertrophy measuring 18 mm in basal septum (12 mm posterior wall). Consider cardiac MRI to assess for hypertrophic cardiomyopathy if clinically indicated  3. Definity contrast agent was given IV to delineate the left ventricular endocardial borders.  4. Global right ventricle  has normal systolic function.The right ventricular size is normal. No increase in right ventricular wall thickness.  5. There is mild dilatation of the aortic root measuring 39 mm.  6. The inferior vena cava is dilated in size with <50% respiratory variability, suggesting right atrial pressure of 15 mmHg.  PYP scan 10/25/19: The study is normal. No evidence of TTR amyloidosis.   Cardiac monitor 11/11/19: No significant abnormalities No atrial fibrillation. No VT Occasional PVCs (1.3% of beats). 1 patient triggered event, which appears to correspond to short pause (1.1 seconds) from a blocked PA   Predominant rhythm is sinus rhythm. Range is 57 to 137 bpm with average of 89 bpm. No atrial fibrillation, sustained ventricular tachycardia, significant pause, or high degree AV block. Occasional PVCs (1.3% of beats). 1 patient triggered events. Triggered event appears to correspond to short pause (1.1 seconds) from a blocked PAC.  No significant abnormalities.    Recent Labs: 06/25/2023: BNP 27.5 11/09/2023: Hemoglobin 11.5; Platelets 276 11/12/2023: ALT 28; BUN 37; Creatinine, Ser 1.99; Potassium 3.7; Sodium 139  Recent Lipid Panel    Component Value Date/Time   CHOL 114 11/12/2023 0705   CHOL 161 10/24/2020 0943   TRIG 162 (H) 11/12/2023 0705   HDL 23 (L) 11/12/2023 0705   HDL 30 (L) 10/24/2020 0943   CHOLHDL 5.0 11/12/2023 0705   VLDL 32 11/12/2023 0705   LDLCALC 59 11/12/2023 0705   LDLCALC 99 10/24/2020 0943    Physical Exam:    VS:  BP 123/73 (BP Location: Left Arm, Patient Position: Sitting, Cuff Size: Normal)  Pulse 81   Ht 6\' 4"  (1.93 m)   Wt 224 lb (101.6 kg)   SpO2 99%   BMI 27.27 kg/m     Wt Readings from Last 3 Encounters:  12/17/23 224 lb (101.6 kg)  11/10/23 243 lb (110.2 kg)  11/03/23 247 lb (112 kg)     GEN: Well nourished, well developed in no acute distress HEENT: Normal NECK: No JVD CARDIAC: RRR, no murmurs, rubs, gallops RESPIRATORY:  Clear to  auscultation without rales, wheezing or rhonchi  ABDOMEN: Soft, non-tender, non-distended MUSCULOSKELETAL:  No edema; No deformity  SKIN: Warm and dry NEUROLOGIC:  Alert and oriented x 3 PSYCHIATRIC:  Normal affect   ASSESSMENT:    1. Hypertrophic cardiomyopathy (HCC)   2. Cerebrovascular accident (CVA), unspecified mechanism (HCC)   3. DOE (dyspnea on exertion)   4. Essential hypertension   5. Hyperlipidemia, unspecified hyperlipidemia type      PLAN:    Hypertrophic cardiomyopathy: 20mm (lateral wall 12 mm) on CMR.  In addition, had unusual scar pattern for HCM, with basal inferolateral scar on MRI.  Fabry's disease can be associated with this scar pattern and asymmetric hypertrophy, so alpha galactosidase was checked and was normal. Scar pattern not c/w amyloid on CMR, but unfortunately unable to do T1 mapping to r/o amyloid due to DBS.  No evidence of amyloid on PYP scan in 2020.  Scar pattern may represent prior episode of myocarditis.  Suspect HCM.  No obstruction.  Recommended screening of first-degree relatives with TTE, he reports that he has discussed with his sons.  Cardiac monitor on 08/22/2020 showed one 4 beat run of NSVT, 6 runs of SVT longest lasting 16 beats, occasional PVCs (4%).  -SPEP/UPEP/light chains do not show evidence of AL amyloid.  PYP scan negative in 2020, repeated given high clinical concern for amyloid; PYP on 06/07/2021 not suggestive of amyloidosis. -Continue Toprol-XL 50 mg daily -Favor avoiding scheduled diuretics given suspected HCM.  Appears euvolemic.  Continue Lasix as needed.  Asked to monitor daily weights and take if gains more than 3 pounds in 1 day or 5 pounds in 1 week  CVA: in 2022, subclavian artery thrombosis diagnosed, unclear if cardioembolic source or developed in situ.    No AF on cardiac monitor. He was admitted 10/2023 with CVA and lower extremity DVT.  His anticoagulation had been discontinued and he was on aspirin, Plavix.  He was switched  to Eliquis.  Lifelong anticoagulation recommended.  TEE 11/13/2023 showed small PFO, EF 60 to 65%, normal RV function, mild aortic stenosis. -He reports he stopped taking Eliquis and switch to Plavix due to misunderstanding after his discharge.  Recommend stopping Plavix and restarting Eliquis 5 mg twice daily as planned  Dyspnea: Reports dyspnea with minimal exertion.  Lexiscan Myoview 06/2023 with no evidence of ischemia.  Suspect deconditioning   Hypertension: On amlodipine 2.5 mg daily,  losartan 100 mg daily, Toprol-XL 50 mg daily   Hyperlipidemia: On atorvastatin 80 mg daily.  LDL 99 on 10/24/2020.  Calcium score 588 (68th percentile).  Zetia 10 mg daily was added.  LDL 59 on 11/12/2023, Lp(a) 158   Pericardial effusion: Large effusion noted on echocardiogram 01/2021.  Started on colchicine for suspected pericarditis.  Completed course of colchicine.  Repeat echocardiogram 03/12/2021 shows resolution of effusion   Type 2 diabetes: A1c 8.9% on 01/30/2021.  On insulin  PVCs: occasional (1.3%) on monitor.  He is asymptomatic and with normal LV systolic function, no treatment indicated  Leg pain:  Normal ABIs 02/2022   RTC in 4 months    Medication Adjustments/Labs and Tests Ordered: Current medicines are reviewed at length with the patient today.  Concerns regarding medicines are outlined above.  No orders of the defined types were placed in this encounter.  Meds ordered this encounter  Medications   apixaban (ELIQUIS) 5 MG TABS tablet    Sig: Take 1 tablet (5 mg total) by mouth 2 (two) times daily.    Dispense:  90 tablet    Refill:  3    Patient Instructions  Medication Instructions:  Start Eliquis 5 mg Twice a day Stop starter Pack of Eliquis Stop Plavix 75 mg daily Continue all current medications *If you need a refill on your cardiac medications before your next appointment, please call your pharmacy*   Lab Work: none If you have labs (blood work) drawn today and your  tests are completely normal, you will receive your results only by: MyChart Message (if you have MyChart) OR A paper copy in the mail If you have any lab test that is abnormal or we need to change your treatment, we will call you to review the results.   Testing/Procedures: none   Follow-Up: At [facility], you and your health needs are our priority.  As part of our continuing mission to provide you with exceptional heart care, we have created designated Provider Care Teams.  These Care Teams include your primary Cardiologist (physician) and Advanced Practice Providers (APPs -  Physician Assistants and Nurse Practitioners) who all work together to provide you with the care you need, when you need it.  We recommend signing up for the patient portal called "MyChart".  Sign up information is provided on this After Visit Summary.  MyChart is used to connect with patients for Virtual Visits (Telemedicine).  Patients are able to view lab/test results, encounter notes, upcoming appointments, etc.  Non-urgent messages can be sent to your provider as well.   To learn more about what you can do with MyChart, go to [email or web address].    Your next appointment:   4 month(s)  Provider:   Little Ishikawa, MD     Other Instructions none            Signed, Little Ishikawa, MD  12/17/2023 5:12 PM    [facility]

## 2023-12-15 DIAGNOSIS — N1832 Chronic kidney disease, stage 3b: Secondary | ICD-10-CM | POA: Diagnosis not present

## 2023-12-15 DIAGNOSIS — I69398 Other sequelae of cerebral infarction: Secondary | ICD-10-CM | POA: Diagnosis not present

## 2023-12-15 DIAGNOSIS — R29898 Other symptoms and signs involving the musculoskeletal system: Secondary | ICD-10-CM | POA: Diagnosis not present

## 2023-12-15 DIAGNOSIS — I1310 Hypertensive heart and chronic kidney disease without heart failure, with stage 1 through stage 4 chronic kidney disease, or unspecified chronic kidney disease: Secondary | ICD-10-CM | POA: Diagnosis not present

## 2023-12-15 DIAGNOSIS — I472 Ventricular tachycardia, unspecified: Secondary | ICD-10-CM | POA: Diagnosis not present

## 2023-12-15 DIAGNOSIS — I2699 Other pulmonary embolism without acute cor pulmonale: Secondary | ICD-10-CM | POA: Diagnosis not present

## 2023-12-15 DIAGNOSIS — Z794 Long term (current) use of insulin: Secondary | ICD-10-CM | POA: Diagnosis not present

## 2023-12-15 DIAGNOSIS — E1122 Type 2 diabetes mellitus with diabetic chronic kidney disease: Secondary | ICD-10-CM | POA: Diagnosis not present

## 2023-12-15 DIAGNOSIS — I82509 Chronic embolism and thrombosis of unspecified deep veins of unspecified lower extremity: Secondary | ICD-10-CM | POA: Diagnosis not present

## 2023-12-16 DIAGNOSIS — I350 Nonrheumatic aortic (valve) stenosis: Secondary | ICD-10-CM | POA: Diagnosis not present

## 2023-12-16 DIAGNOSIS — N1831 Chronic kidney disease, stage 3a: Secondary | ICD-10-CM | POA: Diagnosis not present

## 2023-12-16 DIAGNOSIS — I129 Hypertensive chronic kidney disease with stage 1 through stage 4 chronic kidney disease, or unspecified chronic kidney disease: Secondary | ICD-10-CM | POA: Diagnosis not present

## 2023-12-16 DIAGNOSIS — K5900 Constipation, unspecified: Secondary | ICD-10-CM | POA: Diagnosis not present

## 2023-12-16 DIAGNOSIS — I82503 Chronic embolism and thrombosis of unspecified deep veins of lower extremity, bilateral: Secondary | ICD-10-CM | POA: Diagnosis not present

## 2023-12-16 DIAGNOSIS — Z8673 Personal history of transient ischemic attack (TIA), and cerebral infarction without residual deficits: Secondary | ICD-10-CM | POA: Diagnosis not present

## 2023-12-16 DIAGNOSIS — R0600 Dyspnea, unspecified: Secondary | ICD-10-CM | POA: Diagnosis not present

## 2023-12-16 DIAGNOSIS — R251 Tremor, unspecified: Secondary | ICD-10-CM | POA: Diagnosis not present

## 2023-12-16 DIAGNOSIS — E1129 Type 2 diabetes mellitus with other diabetic kidney complication: Secondary | ICD-10-CM | POA: Diagnosis not present

## 2023-12-17 ENCOUNTER — Ambulatory Visit: Payer: Medicare PPO

## 2023-12-17 ENCOUNTER — Encounter: Payer: Self-pay | Admitting: Cardiology

## 2023-12-17 ENCOUNTER — Ambulatory Visit: Payer: Medicare PPO | Attending: Cardiology | Admitting: Cardiology

## 2023-12-17 VITALS — BP 123/73 | HR 81 | Ht 76.0 in | Wt 224.0 lb

## 2023-12-17 DIAGNOSIS — I422 Other hypertrophic cardiomyopathy: Secondary | ICD-10-CM | POA: Diagnosis not present

## 2023-12-17 DIAGNOSIS — E785 Hyperlipidemia, unspecified: Secondary | ICD-10-CM

## 2023-12-17 DIAGNOSIS — I639 Cerebral infarction, unspecified: Secondary | ICD-10-CM | POA: Diagnosis not present

## 2023-12-17 DIAGNOSIS — R0609 Other forms of dyspnea: Secondary | ICD-10-CM

## 2023-12-17 DIAGNOSIS — I10 Essential (primary) hypertension: Secondary | ICD-10-CM | POA: Diagnosis not present

## 2023-12-17 MED ORDER — APIXABAN 5 MG PO TABS: 5.0000 mg | ORAL_TABLET | Freq: Two times a day (BID) | ORAL | 3 refills | Status: DC

## 2023-12-17 NOTE — Progress Notes (Unsigned)
[employer] monitor serial # [id number] mailed to patient 11/16/23 and applied in office on 12/17/23.

## 2023-12-17 NOTE — Patient Instructions (Addendum)
Medication Instructions:  Start Eliquis 5 mg Twice a day Stop starter Pack of Eliquis Stop Plavix 75 mg daily Continue all current medications *If you need a refill on your cardiac medications before your next appointment, please call your pharmacy*   Lab Work: none If you have labs (blood work) drawn today and your tests are completely normal, you will receive your results only by: MyChart Message (if you have MyChart) OR A paper copy in the mail If you have any lab test that is abnormal or we need to change your treatment, we will call you to review the results.   Testing/Procedures: none   Follow-Up: At [facility], you and your health needs are our priority.  As part of our continuing mission to provide you with exceptional heart care, we have created designated Provider Care Teams.  These Care Teams include your primary Cardiologist (physician) and Advanced Practice Providers (APPs -  Physician Assistants and Nurse Practitioners) who all work together to provide you with the care you need, when you need it.  We recommend signing up for the patient portal called "MyChart".  Sign up information is provided on this After Visit Summary.  MyChart is used to connect with patients for Virtual Visits (Telemedicine).  Patients are able to view lab/test results, encounter notes, upcoming appointments, etc.  Non-urgent messages can be sent to your provider as well.   To learn more about what you can do with MyChart, go to [email or web address].    Your next appointment:   4 month(s)  Provider:   Little Ishikawa, MD     Other Instructions none

## 2023-12-26 NOTE — Progress Notes (Deleted)
 Assessment/Plan:   1.  Essential Tremor.  -The patient is status post left VIM DBS on July 07, 2019 with [facility] device.  Intention was originally to do a bilateral VIM DBS, but the patient had a vagal episode in the operating room with associated nausea and vomiting with positive CSF flow out the burr hole, and it was decided not to attempt the other side.    -dbs battery needs charged and discussed this today.  He has let this get down several times and discussed that he really needs to charge this on a regular basis.  -Unfortunately,  I think that the patient has developed ataxic tremor.  This is a very different tremor type and virtually impossible to control with DBS.  His tremor is fairly well-controlled until his May, 2022 infarct, which was positioned very close to the DBS lead in the thalamus.  I think that this likely affected the thalmocortical projection pathways and cerebellar outflow tracts.  He got another opinion at [facility] and they concurred.  Patient asked again today if there is anything else we can do, even in the experimental world, and I told him that I do not see other options unfortunately.   2.  History of cerebral infarct, October, 2020 and May, 2022 and December, 2024  -MRI in October, 2020 demonstrated acute right posterior limb of the internal capsule infarct.  However, stroke neurology felt that symptoms were more consistent with a posterior circulation stroke, related to thrombus in the left subclavian   -MRI in 2022 with small infarct in the thalamus on the left.  TEE was negative.  Patient was already on Eliquis (had just been restarted a few weeks before, as had been stopped for pericarditis).  -Infarct in January, 2025 was when patient was off of Eliquis and had also developed a DVT.  Patient was placed back on Eliquis after the event.  Eliquis will be lifelong.  -On atorvastatin, 80 mg, Zetia 10 mg.    -discussed walker at all times  -Discussed physical  therapy for balance, but he wants to hold on that for now until he gets with cardiology and sees if they can help him with shortness of breath.  3.  Dysphagia  -Modified barium swallow in June, 2022 was worse compared to October, 2021 with evidence of pharyngeal phase dysphagia.  4.  Hypertrophic cardiomyopathy  -Following with cardiology but hasn't been there in a while.  He reports he does have an upcoming appointment.  -suspect that the prominent breathing is related to HCM.   This is likely the cause of his fatigue as well.    5.  Memory loss  -I do not suspect neurodegenerative.  -We will repeat neurocognitive testing.  -I suspect there is some pseudodementia here from underlying depression and marital stressors.  Both patient and wife are undergoing medical issues that are chronic and complex and both are frustrated.  I do think counseling could be of value.  I will certainly leave this up to further discretion of primary care.   Subjective:   Joshua [name] was seen in follow-up in the movement disorder clinic for tremor.  Unfortunately, tremor has not been well-controlled since he had his stroke, and he developed ataxic tremor after this.  Tremor is very frustrating for him obviously.  Separately, the patient was back in the hospital at the end of December with acute episode of bilateral lower extremity weakness and numbness.  The right was worse than the  left.  The symptoms developed slowly over about a week, with some worsening of slurred speech.  Because of the weakness, he had a fall.  He presented to the hospital and was admitted.  He ended up seeing Dr. Pearlean Brownie.  He had an MRI of the brain which demonstrated an acute/subacute hyperintense lesion in the posterior left frontal lobe.  Patient was worked up.  His LDL was 70.  Hemoglobin A1c was 6.4.  Patient was off of Eliquis at the time of admission and was on aspirin/Plavix.  It was also noted that patient had an acute right  lower extremity DVT.  Since then, the patient has had a TEE.  This rated small PFO, normal EF.  He is following up with cardiology and just saw his cardiologist January 23.  Notes are reviewed.  He was restarted on the Eliquis.  Aspirin/Plavix was stopped.   Allergies  Allergen Reactions   Melatonin Other (See Comments)    dizzy    Current Outpatient Medications  Medication Instructions   amLODipine (NORVASC) 2.5 mg, Oral, Daily   apixaban (ELIQUIS) 5 mg, Oral, 2 times daily   atorvastatin (LIPITOR) 40 mg, Oral, Daily-1800   Continuous Blood Gluc Sensor (FREESTYLE LIBRE 2 SENSOR) MISC See admin instructions   cycloSPORINE (RESTASIS) 0.05 % ophthalmic emulsion 1 drop, 2 times daily   DULoxetine (CYMBALTA) 120 mg, Daily   ezetimibe (ZETIA) 10 MG tablet TAKE ONE TABLET BY MOUTH ONCE DAILY.   fenofibrate 54 mg, Daily   finasteride (PROSCAR) 5 mg, Oral, Daily   insulin aspart (NOVOLOG) 0-15 Units, Subcutaneous, 3 times daily with meals   losartan (COZAAR) 100 MG tablet 1 tablet, Daily   metFORMIN (GLUCOPHAGE) 500 mg, 2 times daily with meals   metoprolol succinate (TOPROL-XL) 50 mg, Oral, Daily   mirtazapine (REMERON) 7.5 mg, Daily at bedtime   Multiple Vitamin (MULTIVITAMIN WITH MINERALS) TABS tablet 1 tablet, Daily   mupirocin ointment (BACTROBAN) 2 % 1 Application, Topical, 2 times daily   Polyethyl Glycol-Propyl Glycol (SYSTANE OP) 1-2 drops, 4 times daily PRN   polyethylene glycol (MIRALAX / GLYCOLAX) 17 g, Oral, Daily   PRESCRIPTION MEDICATION Daily at bedtime   tamsulosin (FLOMAX) 0.4 mg, Oral, Daily   Toujeo SoloStar 35 Units, Subcutaneous, Daily     Objective:   VITALS:   There were no vitals filed for this visit.   Gen:  Appears stated age and in NAD. HEENT:  Normocephalic, atraumatic. The mucous membranes are moist.    NEUROLOGICAL:  Orientation:  The patient is alert and oriented x 3.  He is able to provide accurate history.  He asks this examiner about her  children and knows about them from prior visits. Cranial nerves: There is good facial symmetry. Extraocular muscles are intact and visual fields are full to confrontational testing.  No significant dysphasia (has had this in the past) or dysarthria today.  Soft palate rises symmetrically and there is no tongue deviation. Hearing is intact to conversational tone. Tone: Tone is good throughout. Motor:  5/5 in the UE/LE   MOVEMENT EXAM: Movements: With the device on, he has almost no tremor of the outstretched hands.  He does have moderate tremor on the right in the wing beating position and has ataxic tremor as he approaches an object on the right.  He has more mild tremor on the L  I have reviewed and interpreted the following labs independently   Chemistry      Component Value Date/Time  NA 139 11/12/2023 0705   NA 141 06/25/2023 1000   K 3.7 11/12/2023 0705   CL 109 11/12/2023 0705   CO2 24 11/12/2023 0705   BUN 37 (H) 11/12/2023 0705   BUN 28 (H) 06/25/2023 1000   CREATININE 1.99 (H) 11/12/2023 0705      Component Value Date/Time   CALCIUM 8.6 (L) 11/12/2023 0705   ALKPHOS 87 11/12/2023 0705   AST 31 11/12/2023 0705   ALT 28 11/12/2023 0705   BILITOT 0.5 11/12/2023 0705   BILITOT 0.3 07/30/2022 1513      Lab Results  Component Value Date   WBC 7.1 11/09/2023   HGB 11.5 (L) 11/09/2023   HCT 35.9 (L) 11/09/2023   MCV 92.1 11/09/2023   PLT 276 11/09/2023   Lab Results  Component Value Date   TSH 1.421 01/30/2021   Total time spent on today's visit was *** minutes, including both face-to-face time and nonface-to-face time.  Time included that spent on review of records (prior notes available to me/labs/imaging if pertinent), discussing treatment and goals, answering patient's questions and coordinating care.  This is separate from his DBS time.  CC:  Creola Corn, MD

## 2023-12-26 DEATH — deceased

## 2023-12-28 ENCOUNTER — Encounter: Payer: Self-pay | Admitting: Neurology

## 2024-01-19 ENCOUNTER — Telehealth: Payer: Self-pay | Admitting: Podiatry

## 2024-01-19 NOTE — Telephone Encounter (Signed)
 Sent e-mail to [email or web address]> to advise of this deceased patient ....   J. Abbott -- 01/19/2024

## 2024-01-19 NOTE — Telephone Encounter (Signed)
 Received call from Richardean Chimera (spouse) -- sd Lorrin Nawrot passed away 12-28-23.  She wanted to be sure Dr. 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  She also requested a list of all co-pays paid in 2024 for their taxes be mailed to their home address (same as on file).  Passed this request to billing/insurance and told Dr. Ardelle Anton as well .Marland Kitchen...    J. Abbott -- 01/19/2024

## 2024-02-02 ENCOUNTER — Institutional Professional Consult (permissible substitution): Payer: Medicare PPO | Admitting: Psychology

## 2024-02-02 ENCOUNTER — Ambulatory Visit: Payer: Self-pay

## 2024-02-11 ENCOUNTER — Encounter: Payer: Medicare PPO | Admitting: Psychology

## 2024-04-26 ENCOUNTER — Ambulatory Visit: Payer: Self-pay | Admitting: Physician Assistant
